# Patient Record
Sex: Female | Born: 1950
Health system: Southern US, Community
[De-identification: ages and names within clinical notes are randomized; demographics above are authoritative.]

## PROBLEM LIST (undated history)

## (undated) DIAGNOSIS — F329 Major depressive disorder, single episode, unspecified: Secondary | ICD-10-CM

## (undated) DIAGNOSIS — I509 Heart failure, unspecified: Secondary | ICD-10-CM

## (undated) DIAGNOSIS — I10 Essential (primary) hypertension: Secondary | ICD-10-CM

## (undated) DIAGNOSIS — G4730 Sleep apnea, unspecified: Secondary | ICD-10-CM

## (undated) DIAGNOSIS — I2510 Atherosclerotic heart disease of native coronary artery without angina pectoris: Secondary | ICD-10-CM

## (undated) DIAGNOSIS — Z5189 Encounter for other specified aftercare: Secondary | ICD-10-CM

## (undated) DIAGNOSIS — F419 Anxiety disorder, unspecified: Secondary | ICD-10-CM

## (undated) DIAGNOSIS — E119 Type 2 diabetes mellitus without complications: Secondary | ICD-10-CM

## (undated) DIAGNOSIS — E785 Hyperlipidemia, unspecified: Secondary | ICD-10-CM

## (undated) DIAGNOSIS — G8929 Other chronic pain: Secondary | ICD-10-CM

## (undated) DIAGNOSIS — I82401 Acute embolism and thrombosis of unspecified deep veins of right lower extremity: Secondary | ICD-10-CM

## (undated) DIAGNOSIS — J449 Chronic obstructive pulmonary disease, unspecified: Secondary | ICD-10-CM

## (undated) DIAGNOSIS — I739 Peripheral vascular disease, unspecified: Secondary | ICD-10-CM

## (undated) DIAGNOSIS — F32A Depression, unspecified: Secondary | ICD-10-CM

## (undated) DIAGNOSIS — K219 Gastro-esophageal reflux disease without esophagitis: Secondary | ICD-10-CM

## (undated) DIAGNOSIS — R011 Cardiac murmur, unspecified: Secondary | ICD-10-CM

## (undated) DIAGNOSIS — Z973 Presence of spectacles and contact lenses: Secondary | ICD-10-CM

## (undated) DIAGNOSIS — M1990 Unspecified osteoarthritis, unspecified site: Secondary | ICD-10-CM

## (undated) DIAGNOSIS — D649 Anemia, unspecified: Secondary | ICD-10-CM

## (undated) HISTORY — PX: CATARACT EXTRACTION: SUR2

## (undated) HISTORY — PX: EYE SURGERY: SHX253

## (undated) HISTORY — PX: CARDIAC CATHETERIZATION: SHX172

## (undated) HISTORY — DX: Heart failure, unspecified: I50.9

## (undated) HISTORY — PX: ABDOMINAL HYSTERECTOMY: SHX81

## (undated) HISTORY — PX: HAND SURGERY: SHX662

## (undated) HISTORY — PX: UTERINE FIBROID SURGERY: SHX826

---

## 1998-09-25 ENCOUNTER — Emergency Department (HOSPITAL_COMMUNITY): Admission: EM | Admit: 1998-09-25 | Discharge: 1998-09-25 | Payer: Self-pay | Admitting: Emergency Medicine

## 2000-05-28 ENCOUNTER — Encounter: Payer: Self-pay | Admitting: Family Medicine

## 2000-05-28 ENCOUNTER — Ambulatory Visit (HOSPITAL_COMMUNITY): Admission: RE | Admit: 2000-05-28 | Discharge: 2000-05-28 | Payer: Self-pay | Admitting: Family Medicine

## 2000-07-03 ENCOUNTER — Encounter: Payer: Self-pay | Admitting: Family Medicine

## 2000-07-03 ENCOUNTER — Ambulatory Visit (HOSPITAL_COMMUNITY): Admission: RE | Admit: 2000-07-03 | Discharge: 2000-07-03 | Payer: Self-pay | Admitting: Family Medicine

## 2000-09-20 ENCOUNTER — Emergency Department (HOSPITAL_COMMUNITY): Admission: EM | Admit: 2000-09-20 | Discharge: 2000-09-20 | Payer: Self-pay | Admitting: Emergency Medicine

## 2000-09-20 ENCOUNTER — Encounter: Payer: Self-pay | Admitting: Emergency Medicine

## 2000-12-02 ENCOUNTER — Ambulatory Visit (HOSPITAL_COMMUNITY): Admission: RE | Admit: 2000-12-02 | Discharge: 2000-12-02 | Payer: Self-pay | Admitting: Family Medicine

## 2000-12-02 ENCOUNTER — Encounter: Payer: Self-pay | Admitting: Family Medicine

## 2000-12-18 ENCOUNTER — Encounter: Payer: Self-pay | Admitting: Emergency Medicine

## 2000-12-18 ENCOUNTER — Emergency Department (HOSPITAL_COMMUNITY): Admission: EM | Admit: 2000-12-18 | Discharge: 2000-12-18 | Payer: Self-pay | Admitting: Emergency Medicine

## 2001-08-05 ENCOUNTER — Emergency Department (HOSPITAL_COMMUNITY): Admission: EM | Admit: 2001-08-05 | Discharge: 2001-08-05 | Payer: Self-pay | Admitting: Emergency Medicine

## 2001-09-01 ENCOUNTER — Ambulatory Visit (HOSPITAL_COMMUNITY): Admission: RE | Admit: 2001-09-01 | Discharge: 2001-09-01 | Payer: Self-pay | Admitting: Family Medicine

## 2002-06-23 ENCOUNTER — Encounter: Payer: Self-pay | Admitting: Emergency Medicine

## 2002-06-23 ENCOUNTER — Emergency Department (HOSPITAL_COMMUNITY): Admission: EM | Admit: 2002-06-23 | Discharge: 2002-06-23 | Payer: Self-pay | Admitting: Emergency Medicine

## 2002-08-05 ENCOUNTER — Emergency Department (HOSPITAL_COMMUNITY): Admission: EM | Admit: 2002-08-05 | Discharge: 2002-08-05 | Payer: Self-pay | Admitting: Emergency Medicine

## 2002-08-05 ENCOUNTER — Encounter: Payer: Self-pay | Admitting: Emergency Medicine

## 2002-08-26 ENCOUNTER — Ambulatory Visit (HOSPITAL_COMMUNITY): Admission: RE | Admit: 2002-08-26 | Discharge: 2002-08-26 | Payer: Self-pay | Admitting: Otolaryngology

## 2002-09-06 ENCOUNTER — Ambulatory Visit (HOSPITAL_COMMUNITY): Admission: RE | Admit: 2002-09-06 | Discharge: 2002-09-06 | Payer: Self-pay | Admitting: Family Medicine

## 2003-02-20 ENCOUNTER — Emergency Department (HOSPITAL_COMMUNITY): Admission: EM | Admit: 2003-02-20 | Discharge: 2003-02-20 | Payer: Self-pay | Admitting: Emergency Medicine

## 2003-02-22 ENCOUNTER — Emergency Department (HOSPITAL_COMMUNITY): Admission: EM | Admit: 2003-02-22 | Discharge: 2003-02-22 | Payer: Self-pay | Admitting: Emergency Medicine

## 2003-02-22 ENCOUNTER — Encounter: Payer: Self-pay | Admitting: Emergency Medicine

## 2003-05-12 ENCOUNTER — Emergency Department (HOSPITAL_COMMUNITY): Admission: EM | Admit: 2003-05-12 | Discharge: 2003-05-12 | Payer: Self-pay | Admitting: Emergency Medicine

## 2003-08-14 ENCOUNTER — Emergency Department (HOSPITAL_COMMUNITY): Admission: EM | Admit: 2003-08-14 | Discharge: 2003-08-14 | Payer: Self-pay | Admitting: Emergency Medicine

## 2003-10-10 ENCOUNTER — Emergency Department (HOSPITAL_COMMUNITY): Admission: EM | Admit: 2003-10-10 | Discharge: 2003-10-10 | Payer: Self-pay | Admitting: Emergency Medicine

## 2003-10-11 ENCOUNTER — Emergency Department (HOSPITAL_COMMUNITY): Admission: EM | Admit: 2003-10-11 | Discharge: 2003-10-11 | Payer: Self-pay | Admitting: *Deleted

## 2004-05-02 ENCOUNTER — Emergency Department (HOSPITAL_COMMUNITY): Admission: EM | Admit: 2004-05-02 | Discharge: 2004-05-02 | Payer: Self-pay | Admitting: Family Medicine

## 2004-08-22 ENCOUNTER — Ambulatory Visit: Payer: Self-pay | Admitting: Internal Medicine

## 2004-08-24 ENCOUNTER — Ambulatory Visit: Payer: Self-pay | Admitting: Family Medicine

## 2004-08-24 ENCOUNTER — Ambulatory Visit: Payer: Self-pay | Admitting: Internal Medicine

## 2004-08-27 ENCOUNTER — Ambulatory Visit: Payer: Self-pay | Admitting: *Deleted

## 2004-08-27 ENCOUNTER — Ambulatory Visit (HOSPITAL_COMMUNITY): Admission: RE | Admit: 2004-08-27 | Discharge: 2004-08-27 | Payer: Self-pay | Admitting: Family Medicine

## 2004-10-08 ENCOUNTER — Ambulatory Visit: Payer: Self-pay | Admitting: Family Medicine

## 2004-10-15 ENCOUNTER — Ambulatory Visit (HOSPITAL_COMMUNITY): Admission: RE | Admit: 2004-10-15 | Discharge: 2004-10-15 | Payer: Self-pay | Admitting: Family Medicine

## 2005-01-14 ENCOUNTER — Emergency Department (HOSPITAL_COMMUNITY): Admission: EM | Admit: 2005-01-14 | Discharge: 2005-01-14 | Payer: Self-pay | Admitting: Emergency Medicine

## 2005-02-04 ENCOUNTER — Ambulatory Visit: Payer: Self-pay | Admitting: Family Medicine

## 2005-04-16 ENCOUNTER — Emergency Department (HOSPITAL_COMMUNITY): Admission: EM | Admit: 2005-04-16 | Discharge: 2005-04-16 | Payer: Self-pay | Admitting: Emergency Medicine

## 2005-05-26 ENCOUNTER — Emergency Department (HOSPITAL_COMMUNITY): Admission: EM | Admit: 2005-05-26 | Discharge: 2005-05-26 | Payer: Self-pay | Admitting: Emergency Medicine

## 2005-06-10 ENCOUNTER — Ambulatory Visit: Payer: Self-pay | Admitting: Family Medicine

## 2005-08-05 ENCOUNTER — Ambulatory Visit: Payer: Self-pay | Admitting: Family Medicine

## 2005-08-19 ENCOUNTER — Ambulatory Visit: Payer: Self-pay | Admitting: Family Medicine

## 2005-10-07 ENCOUNTER — Encounter (INDEPENDENT_AMBULATORY_CARE_PROVIDER_SITE_OTHER): Payer: Self-pay | Admitting: Family Medicine

## 2005-10-07 ENCOUNTER — Ambulatory Visit: Payer: Self-pay | Admitting: Family Medicine

## 2005-10-21 ENCOUNTER — Ambulatory Visit (HOSPITAL_COMMUNITY): Admission: RE | Admit: 2005-10-21 | Discharge: 2005-10-21 | Payer: Self-pay | Admitting: Family Medicine

## 2005-12-27 ENCOUNTER — Ambulatory Visit: Payer: Self-pay | Admitting: Family Medicine

## 2005-12-30 ENCOUNTER — Ambulatory Visit (HOSPITAL_COMMUNITY): Admission: RE | Admit: 2005-12-30 | Discharge: 2005-12-30 | Payer: Self-pay | Admitting: Family Medicine

## 2006-03-13 ENCOUNTER — Ambulatory Visit: Payer: Self-pay | Admitting: Family Medicine

## 2006-04-03 ENCOUNTER — Ambulatory Visit: Payer: Self-pay | Admitting: Family Medicine

## 2006-09-17 ENCOUNTER — Ambulatory Visit: Payer: Self-pay | Admitting: Family Medicine

## 2006-10-30 ENCOUNTER — Ambulatory Visit (HOSPITAL_COMMUNITY): Admission: RE | Admit: 2006-10-30 | Discharge: 2006-10-30 | Payer: Self-pay | Admitting: Family Medicine

## 2006-11-21 ENCOUNTER — Emergency Department (HOSPITAL_COMMUNITY): Admission: EM | Admit: 2006-11-21 | Discharge: 2006-11-21 | Payer: Self-pay | Admitting: Emergency Medicine

## 2007-01-14 ENCOUNTER — Emergency Department (HOSPITAL_COMMUNITY): Admission: EM | Admit: 2007-01-14 | Discharge: 2007-01-15 | Payer: Self-pay | Admitting: Emergency Medicine

## 2007-03-11 ENCOUNTER — Encounter (INDEPENDENT_AMBULATORY_CARE_PROVIDER_SITE_OTHER): Payer: Self-pay | Admitting: *Deleted

## 2007-05-13 ENCOUNTER — Ambulatory Visit: Payer: Self-pay | Admitting: Family Medicine

## 2007-07-06 ENCOUNTER — Ambulatory Visit: Payer: Self-pay | Admitting: Internal Medicine

## 2007-07-23 ENCOUNTER — Ambulatory Visit: Payer: Self-pay | Admitting: Family Medicine

## 2007-08-01 ENCOUNTER — Emergency Department (HOSPITAL_COMMUNITY): Admission: EM | Admit: 2007-08-01 | Discharge: 2007-08-02 | Payer: Self-pay | Admitting: Emergency Medicine

## 2007-10-10 ENCOUNTER — Emergency Department (HOSPITAL_COMMUNITY): Admission: EM | Admit: 2007-10-10 | Discharge: 2007-10-10 | Payer: Self-pay | Admitting: Emergency Medicine

## 2007-10-23 ENCOUNTER — Ambulatory Visit: Payer: Self-pay | Admitting: Internal Medicine

## 2007-11-06 ENCOUNTER — Ambulatory Visit (HOSPITAL_COMMUNITY): Admission: RE | Admit: 2007-11-06 | Discharge: 2007-11-06 | Payer: Self-pay | Admitting: Family Medicine

## 2007-12-18 ENCOUNTER — Ambulatory Visit: Payer: Self-pay | Admitting: Family Medicine

## 2007-12-18 LAB — CONVERTED CEMR LAB
ALT: 8 units/L (ref 0–35)
BUN: 11 mg/dL (ref 6–23)
CO2: 23 meq/L (ref 19–32)
Calcium: 9.3 mg/dL (ref 8.4–10.5)
Chloride: 106 meq/L (ref 96–112)
Cholesterol: 204 mg/dL — ABNORMAL HIGH (ref 0–200)
Creatinine, Ser: 0.65 mg/dL (ref 0.40–1.20)
Glucose, Bld: 103 mg/dL — ABNORMAL HIGH (ref 70–99)
Total Bilirubin: 0.4 mg/dL (ref 0.3–1.2)
Total CHOL/HDL Ratio: 4.4
Triglycerides: 95 mg/dL (ref <150)
Vit D, 1,25-Dihydroxy: 13 — ABNORMAL LOW (ref 30–89)

## 2008-03-14 ENCOUNTER — Ambulatory Visit: Payer: Self-pay | Admitting: Internal Medicine

## 2008-03-14 LAB — CONVERTED CEMR LAB: Vit D, 1,25-Dihydroxy: 34 (ref 30–89)

## 2008-06-13 ENCOUNTER — Emergency Department (HOSPITAL_COMMUNITY): Admission: EM | Admit: 2008-06-13 | Discharge: 2008-06-13 | Payer: Self-pay | Admitting: Emergency Medicine

## 2008-08-22 ENCOUNTER — Emergency Department (HOSPITAL_COMMUNITY): Admission: EM | Admit: 2008-08-22 | Discharge: 2008-08-23 | Payer: Self-pay | Admitting: Emergency Medicine

## 2008-11-07 ENCOUNTER — Emergency Department (HOSPITAL_COMMUNITY): Admission: EM | Admit: 2008-11-07 | Discharge: 2008-11-08 | Payer: Self-pay | Admitting: Emergency Medicine

## 2008-11-09 ENCOUNTER — Ambulatory Visit (HOSPITAL_COMMUNITY): Admission: RE | Admit: 2008-11-09 | Discharge: 2008-11-09 | Payer: Self-pay | Admitting: Family Medicine

## 2008-11-10 ENCOUNTER — Ambulatory Visit: Payer: Self-pay | Admitting: Family Medicine

## 2008-11-11 ENCOUNTER — Ambulatory Visit (HOSPITAL_COMMUNITY): Admission: RE | Admit: 2008-11-11 | Discharge: 2008-11-11 | Payer: Self-pay | Admitting: Family Medicine

## 2008-12-17 ENCOUNTER — Emergency Department (HOSPITAL_COMMUNITY): Admission: EM | Admit: 2008-12-17 | Discharge: 2008-12-17 | Payer: Self-pay | Admitting: Emergency Medicine

## 2008-12-17 ENCOUNTER — Encounter (INDEPENDENT_AMBULATORY_CARE_PROVIDER_SITE_OTHER): Payer: Self-pay | Admitting: Emergency Medicine

## 2008-12-17 ENCOUNTER — Ambulatory Visit: Payer: Self-pay | Admitting: *Deleted

## 2008-12-29 ENCOUNTER — Ambulatory Visit: Payer: Self-pay | Admitting: Family Medicine

## 2009-04-05 ENCOUNTER — Emergency Department (HOSPITAL_COMMUNITY): Admission: EM | Admit: 2009-04-05 | Discharge: 2009-04-06 | Payer: Self-pay | Admitting: Emergency Medicine

## 2009-05-16 ENCOUNTER — Ambulatory Visit: Payer: Self-pay | Admitting: Internal Medicine

## 2009-05-16 ENCOUNTER — Ambulatory Visit: Payer: Self-pay | Admitting: Family Medicine

## 2009-09-06 ENCOUNTER — Emergency Department (HOSPITAL_COMMUNITY): Admission: EM | Admit: 2009-09-06 | Discharge: 2009-09-06 | Payer: Self-pay | Admitting: Emergency Medicine

## 2009-10-19 ENCOUNTER — Ambulatory Visit: Payer: Self-pay | Admitting: Family Medicine

## 2009-11-10 ENCOUNTER — Ambulatory Visit (HOSPITAL_COMMUNITY): Admission: RE | Admit: 2009-11-10 | Discharge: 2009-11-10 | Payer: Self-pay | Admitting: Family Medicine

## 2010-04-03 ENCOUNTER — Ambulatory Visit: Payer: Self-pay | Admitting: Family Medicine

## 2010-04-03 LAB — CONVERTED CEMR LAB
CO2: 26 meq/L (ref 19–32)
Creatinine, Ser: 0.72 mg/dL (ref 0.40–1.20)
Eosinophils Absolute: 0.3 10*3/uL (ref 0.0–0.7)
Eosinophils Relative: 3 % (ref 0–5)
Glucose, Bld: 87 mg/dL (ref 70–99)
HCT: 41 % (ref 36.0–46.0)
Hemoglobin: 13.2 g/dL (ref 12.0–15.0)
Lymphocytes Relative: 46 % (ref 12–46)
Lymphs Abs: 4 10*3/uL (ref 0.7–4.0)
MCV: 85.8 fL (ref 78.0–100.0)
Monocytes Absolute: 0.6 10*3/uL (ref 0.1–1.0)
RDW: 15.5 % (ref 11.5–15.5)
Total Bilirubin: 0.2 mg/dL — ABNORMAL LOW (ref 0.3–1.2)

## 2010-05-21 ENCOUNTER — Emergency Department (HOSPITAL_COMMUNITY): Admission: EM | Admit: 2010-05-21 | Discharge: 2010-05-21 | Payer: Self-pay | Admitting: Emergency Medicine

## 2010-06-09 ENCOUNTER — Emergency Department (HOSPITAL_COMMUNITY)
Admission: EM | Admit: 2010-06-09 | Discharge: 2010-06-09 | Payer: Self-pay | Source: Home / Self Care | Admitting: Emergency Medicine

## 2010-06-19 ENCOUNTER — Ambulatory Visit (HOSPITAL_COMMUNITY)
Admission: RE | Admit: 2010-06-19 | Discharge: 2010-06-19 | Payer: Self-pay | Source: Home / Self Care | Attending: Family Medicine | Admitting: Family Medicine

## 2010-07-15 ENCOUNTER — Encounter: Payer: Self-pay | Admitting: Family Medicine

## 2010-09-04 LAB — CBC
HCT: 40.7 % (ref 36.0–46.0)
Hemoglobin: 13.7 g/dL (ref 12.0–15.0)
MCV: 84.3 fL (ref 78.0–100.0)
RBC: 4.83 MIL/uL (ref 3.87–5.11)
WBC: 10.3 10*3/uL (ref 4.0–10.5)

## 2010-09-04 LAB — DIFFERENTIAL
Eosinophils Relative: 3 % (ref 0–5)
Lymphocytes Relative: 36 % (ref 12–46)
Lymphs Abs: 3.7 10*3/uL (ref 0.7–4.0)
Monocytes Absolute: 0.8 10*3/uL (ref 0.1–1.0)
Monocytes Relative: 8 % (ref 3–12)

## 2010-09-16 LAB — POCT CARDIAC MARKERS: Troponin i, poc: <0.05 ng/mL (ref 0.00–0.09)

## 2010-09-16 LAB — DIFFERENTIAL
Basophils Relative: 1 % (ref 0–1)
Eosinophils Absolute: 0.3 10*3/uL (ref 0.0–0.7)
Monocytes Relative: 9 % (ref 3–12)
Neutrophils Relative %: 40 % — ABNORMAL LOW (ref 43–77)

## 2010-09-16 LAB — CBC
HCT: 42.5 % (ref 36.0–46.0)
MCV: 87.9 fL (ref 78.0–100.0)
Platelets: 392 10*3/uL (ref 150–400)
RDW: 15.1 % (ref 11.5–15.5)
WBC: 8.1 10*3/uL (ref 4.0–10.5)

## 2010-09-16 LAB — COMPREHENSIVE METABOLIC PANEL
Albumin: 3.8 g/dL (ref 3.5–5.2)
BUN: 11 mg/dL (ref 6–23)
Chloride: 104 mEq/L (ref 96–112)
Creatinine, Ser: 0.7 mg/dL (ref 0.4–1.2)
Total Bilirubin: 0.6 mg/dL (ref 0.3–1.2)

## 2010-09-16 LAB — D-DIMER, QUANTITATIVE: D-Dimer, Quant: 0.42 ug/mL-FEU (ref 0.00–0.48)

## 2010-10-02 LAB — POCT CARDIAC MARKERS
CKMB, poc: 1.2 ng/mL (ref 1.0–8.0)
Myoglobin, poc: 69.6 ng/mL (ref 12–200)
Troponin i, poc: <0.05 ng/mL (ref 0.00–0.09)

## 2010-10-04 LAB — DIFFERENTIAL
Basophils Absolute: 0.1 10*3/uL (ref 0.0–0.1)
Basophils Relative: 1 % (ref 0–1)
Eosinophils Absolute: 0.3 10*3/uL (ref 0.0–0.7)
Eosinophils Relative: 3 % (ref 0–5)
Neutrophils Relative %: 55 % (ref 43–77)

## 2010-10-04 LAB — CBC
HCT: 42.4 % (ref 36.0–46.0)
Platelets: 378 10*3/uL (ref 150–400)
WBC: 8.9 10*3/uL (ref 4.0–10.5)

## 2010-10-04 LAB — CK TOTAL AND CKMB (NOT AT ARMC)
CK, MB: 1.6 ng/mL (ref 0.3–4.0)
Relative Index: INVALID (ref 0.0–2.5)
Total CK: 89 U/L (ref 7–177)

## 2010-10-04 LAB — BASIC METABOLIC PANEL
BUN: 13 mg/dL (ref 6–23)
Calcium: 9.3 mg/dL (ref 8.4–10.5)
Creatinine, Ser: 0.62 mg/dL (ref 0.4–1.2)
GFR calc non Af Amer: >60 mL/min (ref >60)
Potassium: 3.6 mEq/L (ref 3.5–5.1)

## 2010-10-04 LAB — TROPONIN I: Troponin I: 0.01 ng/mL (ref 0.00–0.06)

## 2010-10-16 ENCOUNTER — Other Ambulatory Visit (HOSPITAL_COMMUNITY): Payer: Self-pay | Admitting: Family Medicine

## 2010-10-16 DIAGNOSIS — Z1231 Encounter for screening mammogram for malignant neoplasm of breast: Secondary | ICD-10-CM

## 2010-11-10 ENCOUNTER — Emergency Department (HOSPITAL_COMMUNITY): Payer: Medicare Other

## 2010-11-10 ENCOUNTER — Emergency Department (HOSPITAL_COMMUNITY)
Admission: EM | Admit: 2010-11-10 | Discharge: 2010-11-10 | Disposition: A | Discharge status: Home / Self Care | Payer: Medicare Other | Attending: Emergency Medicine | Admitting: Emergency Medicine

## 2010-11-10 DIAGNOSIS — Z79899 Other long term (current) drug therapy: Secondary | ICD-10-CM | POA: Insufficient documentation

## 2010-11-10 DIAGNOSIS — M129 Arthropathy, unspecified: Secondary | ICD-10-CM | POA: Insufficient documentation

## 2010-11-10 DIAGNOSIS — I10 Essential (primary) hypertension: Secondary | ICD-10-CM | POA: Insufficient documentation

## 2010-11-10 DIAGNOSIS — M545 Low back pain, unspecified: Secondary | ICD-10-CM | POA: Insufficient documentation

## 2010-11-13 ENCOUNTER — Ambulatory Visit (HOSPITAL_COMMUNITY)
Admission: RE | Admit: 2010-11-13 | Discharge: 2010-11-13 | Disposition: A | Discharge status: Home / Self Care | Payer: Medicare Other | Source: Ambulatory Visit | Attending: Family Medicine | Admitting: Family Medicine

## 2010-11-13 DIAGNOSIS — Z1231 Encounter for screening mammogram for malignant neoplasm of breast: Secondary | ICD-10-CM | POA: Insufficient documentation

## 2010-11-15 ENCOUNTER — Other Ambulatory Visit: Payer: Self-pay | Admitting: Family Medicine

## 2010-11-15 DIAGNOSIS — R928 Other abnormal and inconclusive findings on diagnostic imaging of breast: Secondary | ICD-10-CM

## 2010-11-21 ENCOUNTER — Ambulatory Visit
Admission: RE | Admit: 2010-11-21 | Discharge: 2010-11-21 | Disposition: A | Discharge status: Home / Self Care | Payer: Medicare Other | Source: Ambulatory Visit | Attending: Family Medicine | Admitting: Family Medicine

## 2010-11-21 DIAGNOSIS — R928 Other abnormal and inconclusive findings on diagnostic imaging of breast: Secondary | ICD-10-CM

## 2010-11-27 ENCOUNTER — Other Ambulatory Visit: Payer: Self-pay | Admitting: Neurosurgery

## 2010-11-27 DIAGNOSIS — M545 Low back pain: Secondary | ICD-10-CM

## 2010-11-30 ENCOUNTER — Ambulatory Visit
Admission: RE | Admit: 2010-11-30 | Discharge: 2010-11-30 | Disposition: A | Discharge status: Home / Self Care | Payer: Medicare Other | Source: Ambulatory Visit | Attending: Neurosurgery | Admitting: Neurosurgery

## 2010-11-30 DIAGNOSIS — M545 Low back pain: Secondary | ICD-10-CM

## 2011-01-10 ENCOUNTER — Observation Stay (HOSPITAL_COMMUNITY)
Admission: EM | Admit: 2011-01-10 | Discharge: 2011-01-11 | Disposition: A | Discharge status: Home / Self Care | Payer: Medicare Other | Attending: Internal Medicine | Admitting: Internal Medicine

## 2011-01-10 ENCOUNTER — Emergency Department (HOSPITAL_COMMUNITY): Payer: Medicare Other

## 2011-01-10 DIAGNOSIS — D72829 Elevated white blood cell count, unspecified: Secondary | ICD-10-CM | POA: Insufficient documentation

## 2011-01-10 DIAGNOSIS — M129 Arthropathy, unspecified: Secondary | ICD-10-CM | POA: Insufficient documentation

## 2011-01-10 DIAGNOSIS — E785 Hyperlipidemia, unspecified: Secondary | ICD-10-CM | POA: Insufficient documentation

## 2011-01-10 DIAGNOSIS — F17200 Nicotine dependence, unspecified, uncomplicated: Secondary | ICD-10-CM | POA: Insufficient documentation

## 2011-01-10 DIAGNOSIS — E669 Obesity, unspecified: Secondary | ICD-10-CM | POA: Insufficient documentation

## 2011-01-10 DIAGNOSIS — R079 Chest pain, unspecified: Principal | ICD-10-CM | POA: Insufficient documentation

## 2011-01-10 DIAGNOSIS — R0602 Shortness of breath: Secondary | ICD-10-CM | POA: Insufficient documentation

## 2011-01-10 DIAGNOSIS — G8929 Other chronic pain: Secondary | ICD-10-CM | POA: Insufficient documentation

## 2011-01-10 DIAGNOSIS — I10 Essential (primary) hypertension: Secondary | ICD-10-CM | POA: Insufficient documentation

## 2011-01-10 DIAGNOSIS — Z8249 Family history of ischemic heart disease and other diseases of the circulatory system: Secondary | ICD-10-CM | POA: Insufficient documentation

## 2011-01-10 DIAGNOSIS — M549 Dorsalgia, unspecified: Secondary | ICD-10-CM | POA: Insufficient documentation

## 2011-01-10 DIAGNOSIS — J45909 Unspecified asthma, uncomplicated: Secondary | ICD-10-CM | POA: Insufficient documentation

## 2011-01-10 LAB — PRO B NATRIURETIC PEPTIDE: Pro B Natriuretic peptide (BNP): 8.1 pg/mL (ref 0–125)

## 2011-01-10 LAB — COMPREHENSIVE METABOLIC PANEL
AST: 18 U/L (ref 0–37)
Albumin: 3.8 g/dL (ref 3.5–5.2)
Alkaline Phosphatase: 91 U/L (ref 39–117)
Chloride: 100 mEq/L (ref 96–112)
Creatinine, Ser: 0.75 mg/dL (ref 0.50–1.10)
Potassium: 3.7 mEq/L (ref 3.5–5.1)
Total Bilirubin: 0.3 mg/dL (ref 0.3–1.2)
Total Protein: 8.5 g/dL — ABNORMAL HIGH (ref 6.0–8.3)

## 2011-01-10 LAB — CARDIAC PANEL(CRET KIN+CKTOT+MB+TROPI): Relative Index: INVALID (ref 0.0–2.5)

## 2011-01-10 LAB — URINALYSIS, MICROSCOPIC ONLY
Bilirubin Urine: NEGATIVE
Glucose, UA: NEGATIVE mg/dL
Hgb urine dipstick: NEGATIVE
Ketones, ur: NEGATIVE mg/dL
Protein, ur: NEGATIVE mg/dL

## 2011-01-10 LAB — CBC
MCHC: 33.3 g/dL (ref 30.0–36.0)
Platelets: 448 10*3/uL — ABNORMAL HIGH (ref 150–400)
RDW: 15 % (ref 11.5–15.5)
WBC: 13.4 10*3/uL — ABNORMAL HIGH (ref 4.0–10.5)

## 2011-01-10 LAB — RAPID URINE DRUG SCREEN, HOSP PERFORMED
Amphetamines: NOT DETECTED
Benzodiazepines: NOT DETECTED
Cocaine: NOT DETECTED
Opiates: NOT DETECTED
Tetrahydrocannabinol: POSITIVE — AB

## 2011-01-10 LAB — TROPONIN I: Troponin I: <0.3 ng/mL (ref <0.30)

## 2011-01-10 MED ORDER — IOHEXOL 300 MG/ML  SOLN
100.0000 mL | Freq: Once | INTRAMUSCULAR | Status: AC | PRN
  Administered 2011-01-10: 100 mL via INTRAVENOUS

## 2011-01-11 ENCOUNTER — Ambulatory Visit (HOSPITAL_COMMUNITY)
Admission: RE | Admit: 2011-01-11 | Discharge: 2011-01-11 | Disposition: A | Discharge status: Home / Self Care | Payer: Medicare Other | Source: Ambulatory Visit | Attending: Cardiology | Admitting: Cardiology

## 2011-01-11 LAB — LIPID PANEL
Total CHOL/HDL Ratio: 4.3 RATIO
VLDL: 32 mg/dL (ref 0–40)

## 2011-01-11 LAB — BASIC METABOLIC PANEL
BUN: 15 mg/dL (ref 6–23)
Calcium: 9.5 mg/dL (ref 8.4–10.5)
Chloride: 99 mEq/L (ref 96–112)
Creatinine, Ser: 0.8 mg/dL (ref 0.50–1.10)
GFR calc Af Amer: >60 mL/min (ref >60)

## 2011-01-11 LAB — CBC
HCT: 38.4 % (ref 36.0–46.0)
MCHC: 32.3 g/dL (ref 30.0–36.0)
Platelets: 409 10*3/uL — ABNORMAL HIGH (ref 150–400)
RDW: 15 % (ref 11.5–15.5)

## 2011-01-11 LAB — CARDIAC PANEL(CRET KIN+CKTOT+MB+TROPI)
CK, MB: 2.2 ng/mL (ref 0.3–4.0)
Total CK: 76 U/L (ref 7–177)
Troponin I: <0.3 ng/mL (ref <0.30)

## 2011-01-11 LAB — TSH: TSH: 2.688 u[IU]/mL (ref 0.350–4.500)

## 2011-01-11 LAB — HEMOGLOBIN A1C: Mean Plasma Glucose: 146 mg/dL — ABNORMAL HIGH (ref <117)

## 2011-01-11 MED ORDER — TECHNETIUM TC 99M TETROFOSMIN IV KIT
10.0000 | PACK | Freq: Once | INTRAVENOUS | Status: AC | PRN
  Administered 2011-01-11: 10 via INTRAVENOUS

## 2011-01-11 MED ORDER — TECHNETIUM TC 99M TETROFOSMIN IV KIT
30.0000 | PACK | Freq: Once | INTRAVENOUS | Status: AC | PRN
  Administered 2011-01-11: 30 via INTRAVENOUS

## 2011-01-11 NOTE — H&P (Unsigned)
NAMEJLYN, [name]               ACCOUNT NO.:  [id number]  MEDICAL RECORD NO.:  [id number]  LOCATION:  1407                         FACILITY:  [facility]  PHYSICIAN:  Hartley Barefoot, MD    DATE OF BIRTH:  [birth year]  DATE OF ADMISSION:  01/10/2011 DATE OF DISCHARGE:                             HISTORY & PHYSICAL   CHIEF COMPLAINT:  Chest pain radiating to the arm.  HISTORY OF PRESENT ILLNESS:  This is a 60 year old with past medical history of hypertension, hyperlipidemia, and current smoker who presents complaining of chest pain.  She relates that she had chest pain 3 days prior to admission.  She started to have the chest pain again on the day of admission in the middle of her chest.  Initially, it was 10/10, now has decreased to 4/10.  Pain is intermittent, pressure like. Accompanied with shortness of breath.  She also relates some shortness of breath on exertion for years and chest pain on exertion for years. She related that pain can last up to 45 minutes.  It is not associated with meals or breathing.  The pain started this morning when she was in distress, she is having a lot of social and personal stress with her son and with her friend.  She denies nausea, vomiting, diarrhea, or lower extremity edema.  ALLERGIES:  No known drug allergies.  PAST MEDICAL HISTORY: 1. History of asthma. 2. Arthritis. 3. Hypertension. 4. Hyperlipidemia. 5. Current smoker.  MEDICATIONS: 1. Benazepril/hydrochlorothiazide 20/12.5 p.o. daily. 2. Trazodone 50 mg 2 tablets daily at bedtime. 3. Albuterol inhaler 2 puffs daily as needed. 4. Hydrocodone/Tylenol 5/325 daily as needed.  SOCIAL HISTORY:  She is a current smoker.  She has been smoking since 60 years old.  She smokes a couple of cigarettes per day.  She denies alcohol or recreational drugs.  She lives by herself in an apartment.  FAMILY HISTORY:  Her mother died of unknown cause.  Father had history of hypertension and MI and died  of MI.  PHYSICAL EXAMINATION:  VITAL SIGNS:  Blood pressure 136/83, pulse 60, respirations 20, sat 96 on room air, and temp 98.3. GENERAL:  The patient is lying in bed, in no acute distress. HEENT:  Head is atraumatic and normocephalic.  Eyes are anicteric. Pupils are equal and reactive to light.  Extraocular muscles are intact. CARDIOVASCULAR:  S1 and S2.  Regular rhythm and rate.  No rubs, murmurs, or gallops. LUNGS:  Bilateral good air movement.  No wheezing, crackles, or rhonchi. ABDOMEN:  Bowel sounds positive.  Soft, nontender, and nondistended.  No rigidity, no guarding. EXTREMITIES:  Pulse present.  No edema. NEURO:  Nonfocal.  ADMISSION LABORATORY DATA:  White blood cells 13.4, hemoglobin 13.9, hematocrit 41.4, and platelets 448.  Sodium 136, potassium 3.7, chloride 100, bicarb 26, BUN 17, creatinine 0.7, glucose 100, calcium 10.6, albumin 3.8.  BNP 8.0.  Troponin 0.30.  Chest x-ray, no acute cardiopulmonary disease.  ASSESSMENT AND PLAN:  This is a 60 year old with multiple risk factors of hypertension, hyperlipidemia, and current smoker who presents complaining of chest pain.  She also relates chest pain on exertion and shortness of breath on exertion. 1. Chest pain, rule  out acute coronary syndrome.  Admit to telemetry.     Cardiac enzymes x3 q.8 h.  EKG without ST changes or elevation.     She has typical and atypical feature.  She relates some chest pain     on exertion for years.  Her first set of cardiac enzyme is     negative.  I will risk stratify.  We will get hemoglobin A1c, UDS,     and fasting lipid panel.  She has multiple risk factors.  She is a     current smoker, hyperlipidemia, and she is 60 year old.  We will     ask Cardiology for the inpatient evaluation.  I will get a 2-D     echo, D-dimer, and UDS.  We will start nitroglycerin p.r.n. for     chest pain.  I will hold on beta-blockers due to bradycardia. 2. Leukocytosis, may be reactive.  I will  monitor for fever.  Check     UA, culture, blood cultures. 3. Hypertension.  Continue with home medication. 4. Deep vein thrombosis prophylaxis.  Heparin.     Hartley Barefoot, MD     BR/MEDQ  D:  01/10/2011  T:  01/11/2011  Job:  [id number]

## 2011-01-17 LAB — CULTURE, BLOOD (ROUTINE X 2): Culture: NO GROWTH

## 2011-01-17 NOTE — Discharge Summary (Addendum)
NAMECORBIN, [name]               ACCOUNT NO.:  [id number]  MEDICAL RECORD NO.:  [id number]  LOCATION:  1407                         FACILITY:  [facility]  PHYSICIAN:  Hartley Barefoot, MD    DATE OF BIRTH:  [birth year]  DATE OF ADMISSION:  01/10/2011 DATE OF DISCHARGE:  01/11/2011                              DISCHARGE SUMMARY   PRIMARY CARE PROVIDER:  [facility], Dr. Philipp Deputy.  DISCHARGE DIAGNOSES: 1. Chest pain possible Muscle skelletal Pain 2. Leukocytosis. 3. Hypertension. 4. History of asthma. 5. Hyperlipidemia.  DISCHARGE MEDICATIONS: 1. Protonix 40 mg p.o. daily. 2. Simvastatin 20 mg p.o. daily. 3. Aspirin 81 mg p.o. daily. 4. Albuterol inhaler 2 puffs daily as needed for shortness of breath. 5. Benazepril hydrochlorothiazide 20/12.5 one tablet p.o. daily. 6. Hydrocodone/APAP 5/325 one tablet p.o. daily as needed for pain. 7. Trazodone 50 mg 2 tablets p.o. daily at bedtime as needed for     sleep.  DIAGNOSTIC LABS:  WBC is 11.8, hemoglobin 12.4, hematocrit 38.4, platelets 409.  Sodium 135, potassium 3.5, chloride 99, CO2 of 30, BUN 15, creatinine 0.80, glucose 108.  TSH is 2.68.  Urine drug screen positive for tetrahydrocannabinol, otherwise, negative.  D-dimer is 0.66.  First set of cardiac enzymes yield a total CK 85, CK-MB 2.1, troponin I less than 0.30.  Second set yields a total CK 76, CK-MB 2.2, troponin I less than 0.30.  Hemoglobin A1c is 6.7.  Lipid profile shows cholesterol 218, triglycerides 162, HDL 51, LDL 135.  Urinalysis was negative.  DIAGNOSTIC IMAGING: 1. On January 10, 2011, chest x-ray yields no acute cardiopulmonary     findings. 2. On January 10, 2011, CT angio of the chest was negative for acute PE     or thoracic aortic dissection.  Coronary and aortic plaque. 3. Myocardial Myoview yields no evidence of myocardial ischemia or     infarction.  Normal left ventricular wall motion.  Estimated QGS     ejection fraction 66%.  CONSULTATIONS:  Dr.  [place] with [facility].  PROCEDURE:  Myoview as stated above.  BRIEF HISTORY:  [name] is 60 year old woman with a history of hypertension, hyperlipidemia and current smoker who presented to the ED on July 19 with a chief complaint of chest pain.  She indicated that she had chest pain for 3 days prior to presentation.  She reported she started having chest pain in the middle of her chest.  Initially it was rated 10 out of 10, intermittent, pressure-like.  Associated symptoms included shortness of breath.  She reports she has experienced shortness of breath on exertion for years.  She indicated the pain episode lasted 45 minutes.  She denied nausea, vomiting, diarrhea or lower extremity edema.  [facility] were asked to admit for further evaluation and treatment.  HOSPITAL COURSE BY PROBLEM: 1. Chest pain: .  The patient was     admitted to telemetry unit to rule out acute coronary syndrome.     Cardiac enzymes were cycled 3 times and were negative.  EKG was     done and repeated without ST changes or elevation.  She did     experience another episode in the middle the  night during her     hospitalization with some shortness of breath.  There were no EKG     changes or noted changes in her cardiac enzymes.  Her hemoglobin     A1c, urine drug screen and fasting lipid panel were completed as     stated above.  Dr. Swaziland saw the patient and an inpatient Myoview     was done as stated above.  On admission, the patient did have an     elevated D-dimer.  A CT angio of the chest was completed and ruled     out PE.  The patient had no further episodes of chest pain.  She     will be discharged on 81 mg of aspirin and 20 mg of simvastatin     given her risk factors and hyperlipidemia.  She will follow up with     her primary care provider in 1 to 2 weeks. 2. Leukocytosis likely reactive.  The patient is afebrile.  No signs     or symptoms of infection. 3. Hypertension, fair  control on home meds.  Will be discharged on     same. 4. History of asthma, stable during this hospitalization. 5. Hyperlipidemia.  The patient was started on a statin.  PHYSICAL EXAM:  VITAL SIGNS:  Temperature 98.2, blood pressure 150/91, heart rate 50, respiration 16, sats 96% on room air. GENERAL:  Awake, alert, well nourished, well hydrated, no acute distress. CV:  Regular rate and rhythm.  No murmur, gallop or rub.  Trace lower extremity edema. RESPIRATORY:  Normal effort.  Breath sounds clear to auscultation bilaterally.  Slightly diminished.  No rhonchi, wheezes or rales. ABDOMEN:  Obese, soft, positive bowel sounds throughout, nontender to palpation.  No mass or organomegaly noted. NEURO:  Alert and oriented x3.  Speech clear.  Facial symmetry.  DIET:  Heart healthy, low salt.  ACTIVITY:  No restrictions.  FOLLOWUP:  Follow up with Dr. Philipp Deputy at [facility] in 1 to 2 weeks.  Time spent on this dictation, 35 minutes.     Gwenyth Bender, NP   ______________________________ Hartley Barefoot, MD    KMB/MEDQ  D:  01/11/2011  T:  01/11/2011  Job:  [id number]  cc:   Tresa Endo L. Philipp Deputy, M.D. Fax: [phone]  Electronically Signed by Hartley Barefoot MD on 01/17/2011 09:11:54 PM Electronically Signed by Toya Smothers  on 01/20/2011 07:35:20 AM

## 2011-01-17 NOTE — H&P (Signed)
[name], [name]               ACCOUNT NO.:  [id number]  MEDICAL RECORD NO.:  [id number]  LOCATION:  1407                         FACILITY:  [facility]  PHYSICIAN:  Hartley Barefoot, MD    DATE OF BIRTH:  March 02, 1951  DATE OF ADMISSION:  01/10/2011 DATE OF DISCHARGE:  01/11/2011                             HISTORY & PHYSICAL   CHIEF COMPLAINT:  Chest pain radiating to the arm.  HISTORY OF PRESENT ILLNESS:  This is a 60 year old with past medical history of hypertension, hyperlipidemia, and current smoker who presents complaining of chest pain.  She relates that she had chest pain 3 days prior to admission.  She started to have the chest pain again on the day of admission in the middle of her chest.  Initially, it was 10/10, now has decreased to 4/10.  Pain is intermittent, pressure like. Accompanied with shortness of breath.  She also relates some shortness of breath on exertion for years and chest pain on exertion for years. She related that pain can last up to 45 minutes.  It is not associated with meals or breathing.  The pain started this morning when she was in distress, she is having a lot of social and personal stress with her son and with her friend.  She denies nausea, vomiting, diarrhea, or lower extremity edema.  ALLERGIES:  No known drug allergies.  PAST MEDICAL HISTORY: 1. History of asthma. 2. Arthritis. 3. Hypertension. 4. Hyperlipidemia. 5. Current smoker.  MEDICATIONS: 1. Benazepril/hydrochlorothiazide 20/12.5 p.o. daily. 2. Trazodone 50 mg 2 tablets daily at bedtime. 3. Albuterol inhaler 2 puffs daily as needed. 4. Hydrocodone/Tylenol 5/325 daily as needed.  SOCIAL HISTORY:  She is a current smoker.  She has been smoking since 60 years old.  She smokes a couple of cigarettes per day.  She denies alcohol or recreational drugs.  She lives by herself in an apartment.  FAMILY HISTORY:  Her mother died of unknown cause.  Father had history of hypertension  and MI and died of MI.  PHYSICAL EXAMINATION:  VITAL SIGNS:  Blood pressure 136/83, pulse 60, respirations 20, sat 96 on room air, and temp 98.3. GENERAL:  The patient is lying in bed, in no acute distress. HEENT:  Head is atraumatic and normocephalic.  Eyes are anicteric. Pupils are equal and reactive to light.  Extraocular muscles are intact. CARDIOVASCULAR:  S1 and S2.  Regular rhythm and rate.  No rubs, murmurs, or gallops. LUNGS:  Bilateral good air movement.  No wheezing, crackles, or rhonchi. ABDOMEN:  Bowel sounds positive.  Soft, nontender, and nondistended.  No rigidity, no guarding. EXTREMITIES:  Pulse present.  No edema. NEURO:  Nonfocal.  ADMISSION LABORATORY DATA:  White blood cells 13.4, hemoglobin 13.9, hematocrit 41.4, and platelets 448.  Sodium 136, potassium 3.7, chloride 100, bicarb 26, BUN 17, creatinine 0.7, glucose 100, calcium 10.6, albumin 3.8.  BNP 8.0.  Troponin 0.30.  Chest x-ray, no acute cardiopulmonary disease.  ASSESSMENT AND PLAN:  This is a 60 year old with multiple risk factors of hypertension, hyperlipidemia, and current smoker who presents complaining of chest pain.  She also relates chest pain on exertion and shortness of breath on exertion. 1. Chest  pain, rule out acute coronary syndrome.  Admit to telemetry.     Cardiac enzymes x3 q.8 h.  EKG without ST changes or elevation.     She has typical and atypical feature.  She relates some chest pain     on exertion for years.  Her first set of cardiac enzyme is     negative.  I will risk stratify.  We will get hemoglobin A1c, UDS,     and fasting lipid panel.  She has multiple risk factors.  She is a     current smoker, hyperlipidemia, and she is 60 year old.  We will     ask Cardiology for the inpatient evaluation.  I will get a 2-D     echo, D-dimer, and UDS.  We will start nitroglycerin p.r.n. for     chest pain.  I will hold on beta-blockers due to bradycardia. 2. Leukocytosis, may be  reactive.  I will monitor for fever.  Check     UA, culture, blood cultures. 3. Hypertension.  Continue with home medication. 4. Deep vein thrombosis prophylaxis.  Heparin.     Hartley Barefoot, MD     BR/MEDQ  D:  01/10/2011  T:  01/15/2011  Job:  [id number]  Electronically Signed by Hartley Barefoot MD on 01/17/2011 09:13:45 PM

## 2011-01-18 NOTE — Consult Note (Signed)
NAMESULLIVAN, BLASING NO.:  [id number]  MEDICAL RECORD NO.:  [id number]  LOCATION:  1407                         FACILITY:  [facility]  PHYSICIAN:  Peter M. Swaziland, M.D.  DATE OF BIRTH:  1951/03/02  DATE OF CONSULTATION:  01/10/2011 DATE OF DISCHARGE:                                CONSULTATION   HISTORY OF PRESENT ILLNESS:  [name] is a 60 year old black female with prior history of hypertension, hyperlipidemia, tobacco use, and family history of coronary disease.  She presents with a 3- to 4-day history of substernal chest pain.  The pain occurs at rest.  Mostly occurs at night.  It is mid substernal and radiates to her left arm. There is no associated shortness of breath, diaphoresis, or nausea. Today, the patient had multiple stressors and noted increasing in intensity of her pain.  She was brought by friends to emergency department.  She has no known history of cardiac disease and has never had cardiac evaluation previously.  She is currently pain-free.  PAST MEDICAL HISTORY: 1. Hypertension. 2. Hypercholesterolemia. 3. Arthritis.  PRIOR SURGERIES:  Previous C-section and hysterectomy.  SOCIAL HISTORY:  She is disabled.  She has 3 children.  She is divorced. She formerly worked in [employer] for 25 years.  She is an active smoker.  She denies alcohol or drug use.  FAMILY HISTORY:  Her father died after myocardial infarction.  She also has a 1 brother who has heart disease.  ALLERGIES:  She has no known allergies.  MEDICATIONS PRIOR TO ADMISSION: 1. Benazepril HCT 20/12.5 mg daily. 2. Trazodone 100 mg nightly. 3. Albuterol inhaler p.r.n. 4. Hydrocodone/APAP p.r.n.  She is on no cholesterol-lowering medication.  REVIEW OF SYSTEMS:  She has chronic back pain and has recent lumbar injections.  All other systems are reviewed and are negative.  PHYSICAL EXAMINATION:  GENERAL:  She is an obese, black female, in no apparent distress. HEENT:  She  is normocephalic, atraumatic.  Pupils are equal, round, and reactive to light and accommodation.  Extraocular movements are full. Oropharynx is clear. NECK:  Supple without JVD, adenopathy, thyromegaly, or bruits. LUNGS:  Clear. CARDIAC:  Regular rate and rhythm without gallop, murmur, rub, or click. ABDOMEN:  Obese, soft, and nontender.  There are no masses or hepatosplenomegaly. GU AND PELVIC:  Deferred.  She has no lower extremity edema.  Femoral pedal pulses are 2+. SKIN:  Warm and dry. NEUROLOGIC:  She is alert and oriented x3.  Cranial nerves II through XII are intact.  LABORATORY DATA:  White count 13,400, hemoglobin 13.9, hematocrit 41.7, platelets 448,000.  Sodium is 136, potassium 3.7, chloride 100, CO2 of 26, BUN 17, creatinine 0.75, glucose of 100.  BNP is 8.1.  Troponin is less than 0.31.  ECG is normal.  Chest x-ray shows no active disease.  IMPRESSION: 1. Crescendo chest pain with atypical features.  The patient does have     multiple cardiac risk factors. 2. Hypertension. 3. Hyperlipidemia, untreated. 4. Arthritis. 5. Tobacco abuse.  PLAN:  The patient will be admitted by the hospitalist service.  We will obtain serial cardiac enzymes and ECGs.  We will check a D-dimer level. If  these studies are negative, I would recommend a Lexiscan Myoview study in the morning.  I would recommend statin therapy for hyperlipidemia.  She needs to continue on blood pressure control.  She is counseled on need for smoking cessation.          ______________________________ Peter M. [place]iland, M.D.     PMJ/MEDQ  D:  01/10/2011  T:  01/11/2011  Job:  [id number]  cc:   [facility] Fax: [phone]  Electronically Signed by PETER Swaziland M.D. on 01/18/2011 08:50:21 AM

## 2011-03-03 ENCOUNTER — Emergency Department (HOSPITAL_COMMUNITY)
Admission: EM | Admit: 2011-03-03 | Discharge: 2011-03-03 | Disposition: A | Discharge status: Home / Self Care | Payer: Medicare Other | Attending: Emergency Medicine | Admitting: Emergency Medicine

## 2011-03-03 DIAGNOSIS — G8929 Other chronic pain: Secondary | ICD-10-CM | POA: Insufficient documentation

## 2011-03-03 DIAGNOSIS — I10 Essential (primary) hypertension: Secondary | ICD-10-CM | POA: Insufficient documentation

## 2011-03-03 DIAGNOSIS — M545 Low back pain, unspecified: Secondary | ICD-10-CM | POA: Insufficient documentation

## 2011-03-03 DIAGNOSIS — M549 Dorsalgia, unspecified: Secondary | ICD-10-CM | POA: Insufficient documentation

## 2011-03-15 LAB — I-STAT 8, (EC8 V) (CONVERTED LAB)
BUN: 15
Glucose, Bld: 118 — ABNORMAL HIGH
Potassium: 3.3 — ABNORMAL LOW
TCO2: 30
pH, Ven: 7.397 — ABNORMAL HIGH

## 2011-03-15 LAB — RAPID URINE DRUG SCREEN, HOSP PERFORMED
Benzodiazepines: NOT DETECTED
Cocaine: NOT DETECTED

## 2011-03-15 LAB — URINE CULTURE

## 2011-03-15 LAB — URINALYSIS, ROUTINE W REFLEX MICROSCOPIC
Hgb urine dipstick: NEGATIVE
Protein, ur: NEGATIVE
Urobilinogen, UA: 1

## 2011-03-15 LAB — CBC
Hemoglobin: 12.9
RBC: 4.56

## 2011-03-15 LAB — DIFFERENTIAL
Lymphocytes Relative: 39
Lymphs Abs: 3.7
Monocytes Absolute: 0.7
Monocytes Relative: 7
Neutro Abs: 4.8

## 2011-03-15 LAB — POCT CARDIAC MARKERS
CKMB, poc: 1.1
Myoglobin, poc: 28.9

## 2011-03-15 LAB — POCT I-STAT CREATININE: Operator id: 294511

## 2011-03-18 ENCOUNTER — Emergency Department (HOSPITAL_COMMUNITY)
Admission: EM | Admit: 2011-03-18 | Discharge: 2011-03-18 | Discharge status: Against Medical Advice | Payer: Medicare Other | Attending: Emergency Medicine | Admitting: Emergency Medicine

## 2011-03-18 DIAGNOSIS — M549 Dorsalgia, unspecified: Secondary | ICD-10-CM | POA: Insufficient documentation

## 2011-03-18 DIAGNOSIS — M545 Low back pain, unspecified: Secondary | ICD-10-CM | POA: Insufficient documentation

## 2011-04-14 ENCOUNTER — Emergency Department (HOSPITAL_COMMUNITY): Payer: Medicare Other

## 2011-04-14 ENCOUNTER — Emergency Department (HOSPITAL_COMMUNITY)
Admission: EM | Admit: 2011-04-14 | Discharge: 2011-04-14 | Disposition: A | Discharge status: Home / Self Care | Payer: Medicare Other | Attending: Emergency Medicine | Admitting: Emergency Medicine

## 2011-04-14 DIAGNOSIS — I10 Essential (primary) hypertension: Secondary | ICD-10-CM | POA: Insufficient documentation

## 2011-04-14 DIAGNOSIS — M25569 Pain in unspecified knee: Secondary | ICD-10-CM | POA: Insufficient documentation

## 2011-04-14 DIAGNOSIS — M79609 Pain in unspecified limb: Secondary | ICD-10-CM | POA: Insufficient documentation

## 2011-04-14 DIAGNOSIS — Z79899 Other long term (current) drug therapy: Secondary | ICD-10-CM | POA: Insufficient documentation

## 2011-04-14 DIAGNOSIS — M7989 Other specified soft tissue disorders: Secondary | ICD-10-CM | POA: Insufficient documentation

## 2011-04-14 DIAGNOSIS — IMO0001 Reserved for inherently not codable concepts without codable children: Secondary | ICD-10-CM | POA: Insufficient documentation

## 2011-04-14 DIAGNOSIS — M129 Arthropathy, unspecified: Secondary | ICD-10-CM | POA: Insufficient documentation

## 2011-04-14 DIAGNOSIS — M25469 Effusion, unspecified knee: Secondary | ICD-10-CM | POA: Insufficient documentation

## 2011-09-03 ENCOUNTER — Encounter (HOSPITAL_COMMUNITY): Payer: Self-pay | Admitting: *Deleted

## 2011-09-03 ENCOUNTER — Other Ambulatory Visit: Payer: Self-pay

## 2011-09-03 ENCOUNTER — Emergency Department (HOSPITAL_COMMUNITY): Payer: Medicare Other

## 2011-09-03 ENCOUNTER — Emergency Department (HOSPITAL_COMMUNITY)
Admission: EM | Admit: 2011-09-03 | Discharge: 2011-09-03 | Disposition: A | Discharge status: Home Health | Payer: Medicare Other | Attending: Emergency Medicine | Admitting: Emergency Medicine

## 2011-09-03 DIAGNOSIS — I10 Essential (primary) hypertension: Secondary | ICD-10-CM | POA: Insufficient documentation

## 2011-09-03 DIAGNOSIS — M5030 Other cervical disc degeneration, unspecified cervical region: Secondary | ICD-10-CM | POA: Insufficient documentation

## 2011-09-03 DIAGNOSIS — M546 Pain in thoracic spine: Secondary | ICD-10-CM | POA: Insufficient documentation

## 2011-09-03 DIAGNOSIS — G8929 Other chronic pain: Secondary | ICD-10-CM | POA: Insufficient documentation

## 2011-09-03 DIAGNOSIS — M542 Cervicalgia: Secondary | ICD-10-CM | POA: Insufficient documentation

## 2011-09-03 DIAGNOSIS — M25519 Pain in unspecified shoulder: Secondary | ICD-10-CM | POA: Insufficient documentation

## 2011-09-03 DIAGNOSIS — M5412 Radiculopathy, cervical region: Secondary | ICD-10-CM | POA: Insufficient documentation

## 2011-09-03 DIAGNOSIS — E785 Hyperlipidemia, unspecified: Secondary | ICD-10-CM | POA: Insufficient documentation

## 2011-09-03 DIAGNOSIS — IMO0002 Reserved for concepts with insufficient information to code with codable children: Secondary | ICD-10-CM

## 2011-09-03 HISTORY — DX: Essential (primary) hypertension: I10

## 2011-09-03 HISTORY — DX: Depression, unspecified: F32.A

## 2011-09-03 HISTORY — DX: Major depressive disorder, single episode, unspecified: F32.9

## 2011-09-03 HISTORY — DX: Other chronic pain: G89.29

## 2011-09-03 HISTORY — DX: Anxiety disorder, unspecified: F41.9

## 2011-09-03 HISTORY — DX: Unspecified osteoarthritis, unspecified site: M19.90

## 2011-09-03 HISTORY — DX: Hyperlipidemia, unspecified: E78.5

## 2011-09-03 LAB — POCT I-STAT, CHEM 8
Calcium, Ion: 1.2 mmol/L (ref 1.12–1.32)
Creatinine, Ser: 0.7 mg/dL (ref 0.50–1.10)
Glucose, Bld: 83 mg/dL (ref 70–99)
Hemoglobin: 12.2 g/dL (ref 12.0–15.0)
Sodium: 142 mEq/L (ref 135–145)
TCO2: 27 mmol/L (ref 0–100)

## 2011-09-03 NOTE — ED Notes (Signed)
Patient states she had onset of shoulder pain Saturday that has progressed to both shoulders, upper back, and neck pain.  Patient has increased pain with any movement.  Patient states she does not feel like eating

## 2011-09-03 NOTE — Discharge Instructions (Signed)
Degenerative Disc Disease Degenerative disc disease is a condition caused by the changes that occur in the cushions of the backbone (spinal discs) as you grow older. Spinal discs are soft and compressible discs located between the bones of the spine (vertebrae). They act like shock absorbers. Degenerative disc disease can affect the wholespine. However, the neck and lower back are most commonly affected. Many changes can occur in the spinal discs with aging, such as:  The spinal discs may dry and shrink.   Small tears may occur in the tough, outer covering of the disc (annulus).   The disc space may become smaller due to loss of water.   Abnormal growths in the bone (spurs) may occur. This can put pressure on the nerve roots exiting the spinal canal, causing pain.   The spinal canal may become narrowed.  CAUSES  Degenerative disc disease is a condition caused by the changes that occur in the spinal discs with aging. The exact cause is not known, but there is a genetic basis for many patients. Degenerative changes can occur due to loss of fluid in the disc. This makes the disc thinner and reduces the space between the backbones. Small cracks can develop in the outer layer of the disc. This can lead to the breakdown of the disc. You are more likely to get degenerative disc disease if you are overweight. Smoking cigarettes and doing heavy work such as weightlifting can also increase your risk of this condition. Degenerative changes can start after a sudden injury. Growth of bone spurs can compress the nerve roots and cause pain.  SYMPTOMS  The symptoms vary from person to person. Some people may have no pain, while others have severe pain. The pain may be so severe that it can limit your activities. The location of the pain depends on the part of your backbone that is affected. You will have neck or arm pain if a disc in the neck area is affected. You will have pain in your back, buttocks, or legs if a  disc in the lower back is affected. The pain becomes worse while bending, reaching up, or with twisting movements. The pain may start gradually and then get worse as time passes. It may also start after a major or minor injury. You may feel numbness or tingling in the arms or legs.  DIAGNOSIS  Your caregiver will ask you about your symptoms and about activities or habits that may cause the pain. He or she may also ask about any injuries, diseases, ortreatments you have had earlier. Your caregiver will examine you to check for the range of movement that is possible in the affected area, to check for strength in your extremities, and to check for sensation in the areas of the arms and legs supplied by different nerve roots. An X-ray of the spine may be taken. Your caregiver may suggest other imaging tests, such as a computerized magnetic scan (MRI), if needed.  TREATMENT  Treatment includes rest, modifying your activities, and applying ice and heat. Your caregiver may prescribe medicines to reduce your pain and may ask you to do some exercises to strengthen your back. In some cases, you may need surgery. You and your caregiver will decide on the treatment that is best for you. HOME CARE INSTRUCTIONS   Follow proper lifting and walking techniques as advised by your caregiver.   Maintain good posture.   Exercise regularly as advised.   Perform relaxation exercises.   Change your sitting,   standing, and sleeping habits as advised. Change positions frequently.   Lose weight as advised.   Stop smoking if you smoke.   Wear supportive footwear.  SEEK MEDICAL CARE IF:  The pain does not go away within 1 to 4 weeks. SEEK IMMEDIATE MEDICAL CARE IF:   The pain is severe.   You notice weakness in your arms, hands, or legs.   You begin to lose control of your bladder or bowel.  MAKE SURE YOU:   Understand these instructions.   Will watch your condition.   Will get help right away if you are not  doing well or get worse.  Document Released: 04/07/2007 Document Revised: 05/30/2011 Document Reviewed: 04/07/2007 [facility] Patient Information 2012 [place], [place].Cervical Radiculopathy Cervical radiculopathy happens when a nerve in the neck is pinched or bruised by a slipped (herniated) disk or by arthritic changes in the bones of the cervical spine. This can occur due to an injury or as part of the normal aging process. Pressure on the cervical nerves can cause pain or numbness that runs from your neck all the way down into your arm and fingers. CAUSES  There are many possible causes, including:  Injury.   Muscle tightness in the neck from overuse.   Swollen, painful joints (arthritis).   Breakdown or degeneration in the bones and joints of the spine (spondylosis) due to aging.   Bone spurs that may develop near the cervical nerves.  SYMPTOMS  Symptoms include pain, weakness, or numbness in the affected arm and hand. Pain can be severe or irritating. Symptoms may be worse when extending or turning the neck. DIAGNOSIS  Your caregiver will ask about your symptoms and do a physical exam. He or she may test your strength and reflexes. X-rays, CT scans, and MRI scans may be needed in cases of injury or if the symptoms do not go away after a period of time. Electromyography (EMG) or nerve conduction testing may be done to study how your nerves and muscles are working. TREATMENT  Your caregiver may recommend certain exercises to help relieve your symptoms. Cervical radiculopathy can, and often does, get better with time and treatment. If your problems continue, treatment options may include:  Wearing a soft collar for short periods of time.   Physical therapy to strengthen the neck muscles.   Medicines, such as nonsteroidal anti-inflammatory drugs (NSAIDs), oral corticosteroids, or spinal injections.   Surgery. Different types of surgery may be done depending on the cause of your problems.    HOME CARE INSTRUCTIONS   Put ice on the affected area.   Put ice in a plastic bag.   Place a towel between your skin and the bag.   Leave the ice on for 15 to 20 minutes, 3 to 4 times a day or as directed by your caregiver.   Use a flat pillow when you sleep.   Only take over-the-counter or prescription medicines for pain, discomfort, or fever as directed by your caregiver.   If physical therapy was prescribed, follow your caregiver's directions.   If a soft collar was prescribed, use it as directed.  SEEK IMMEDIATE MEDICAL CARE IF:   Your pain gets much worse and cannot be controlled with medicines.   You have weakness or numbness in your hand, arm, face, or leg.   You have a high fever or a stiff, rigid neck.   You lose bowel or bladder control (incontinence).   You have trouble with walking, balance, or speaking.  MAKE SURE YOU:   Understand these instructions.   Will watch your condition.   Will get help right away if you are not doing well or get worse.  Document Released: 03/05/2001 Document Revised: 05/30/2011 Document Reviewed: 01/22/2011 [facility] Patient Information 2012 [place], [place].

## 2011-09-03 NOTE — ED Provider Notes (Signed)
History     CSN: [id number]  Arrival date & time 09/03/11  1415   First MD Initiated Contact with Patient 09/03/11 1451      Chief Complaint  Patient presents with  . Back Pain  . Shoulder Pain   pleasant patient with a known history of chronic pain, arthritis, hypertension. She began having pain across her upper shoulder blades 2 days ago. This pain also radiates upwards towards the trapezius and neck area. Now having upper back and neck pain and pain in both shoulders. The pain is increased with any sort of movement. She states she is on morphine at home for her chronic pain and does not need any prescriptions. She's had no numbness, weakness or tingling. No chest pain. No shortness of breath. No nausea, vomiting. She did tell the triage nurse, that she has somewhat decreased appetite. Patient states this feels most like her chronic pain, but somewhat worse. No dizziness or syncope.  (Consider location/radiation/quality/duration/timing/severity/associated sxs/prior treatment) HPI  Past Medical History  Diagnosis Date  . Hypertension   . Hyperlipemia   . Chronic pain   . Arthritis   . Anxiety   . Depression     Past Surgical History  Procedure Date  . Cesarean section     No family history on file.  History  Substance Use Topics  . Smoking status: Current Everyday Smoker  . Smokeless tobacco: Not on file  . Alcohol Use: Yes    OB History    Grav Para Term Preterm Abortions TAB SAB Ect Mult Living                  Review of Systems  All other systems reviewed and are negative.    Allergies  Review of patient's allergies indicates no known allergies.  Home Medications   Current Outpatient Rx  Name Route Sig Dispense Refill  . HYDROCHLOROTHIAZIDE 25 MG PO TABS Oral Take 25 mg by mouth daily.      BP 145/75  Pulse 70  Temp(Src) 98 F (36.7 C) (Oral)  Resp 16  SpO2 97%  Physical Exam  Nursing note and vitals reviewed. Constitutional: She is oriented  to person, place, and time. She appears well-developed and well-nourished.       No acute distress, obese, conversant, and jovial  HENT:  Head: Normocephalic and atraumatic.  Eyes: Conjunctivae and EOM are normal. Pupils are equal, round, and reactive to light.  Neck: Neck supple.       Neck is supple. Bilateral paracervical tenderness to palpation of trapezius tenderness  Cardiovascular: Normal rate and regular rhythm.  Exam reveals no gallop and no friction rub.   No murmur heard. Pulmonary/Chest: Breath sounds normal. She has no wheezes. She has no rales. She exhibits no tenderness.  Abdominal: Soft. Bowel sounds are normal. She exhibits no distension. There is no tenderness. There is no rebound and no guarding.  Musculoskeletal: Normal range of motion.       Diffuse tenderness across the upper back. No spinal tenderness. No redness or swelling.  Neurological: She is alert and oriented to person, place, and time. No cranial nerve deficit. Coordination normal.  Skin: Skin is warm and dry. No rash noted.  Psychiatric: She has a normal mood and affect.    ED Course  Procedures (including critical care time)  Labs Reviewed - No data to display No results found.   No diagnosis found.    MDM  Pt is seen and examined;  Initial history  and physical completed.  Will follow.       Suspect bulging disc or degenerative disc disease. Will check plain x-rays basic labs and EKG and reassess.    Date: 09/03/2011  Rate: 60  Rhythm: normal sinus rhythm  QRS Axis: normal  Intervals: normal  ST/T Wave abnormalities: normal  Conduction Disutrbances:none  Narrative Interpretation:   Old EKG Reviewed: unchanged   Results for orders placed during the hospital encounter of 09/03/11  POCT I-STAT TROPONIN I      Component Value Range   Troponin i, poc 0.00  0.00 - 0.08 (ng/mL)   Comment 3           POCT I-STAT, CHEM 8      Component Value Range   Sodium 142  135 - 145 (mEq/L)    Potassium 3.5  3.5 - 5.1 (mEq/L)   Chloride 104  96 - 112 (mEq/L)   BUN 11  6 - 23 (mg/dL)   Creatinine, Ser 6.21  0.50 - 1.10 (mg/dL)   Glucose, Bld 83  70 - 99 (mg/dL)   Calcium, Ion 3.08  1.12 - 1.32 (mmol/L)   TCO2 27  0 - 100 (mmol/L)   Hemoglobin 12.2  12.0 - 15.0 (g/dL)   HCT 65.7  84.6 - 96.2 (%)   Dg Chest 2 View  09/03/2011  *RADIOLOGY REPORT*  Clinical Data: Neck and back pain.  The pain radiates into the anterior aspect of the chest.  CHEST - 2 VIEW  Comparison: 01/10/2011  Findings: Mild cardiomegaly. Pulmonary vascularity is normal. Slight scarring at the left lung base, stable.  Lungs are otherwise clear.  Degenerative spurs throughout the thoracic spine, unchanged.  IMPRESSION: No acute abnormalities.  Original Report Authenticated By: Gwynn Burly, M.D.   Dg Cervical Spine Complete  09/03/2011  *RADIOLOGY REPORT*  Clinical Data: Neck pain and limited range of motion.  CERVICAL SPINE - COMPLETE 4+ VIEW  Comparison: 11/11/2008  Findings: There is slight facet arthritis at C2-3 and moderate facet arthritis at C7-T1 on the right. The patient has prominent uncinate spurring at C4-5, right greater than left. Minimal disc space narrowing at C4-5.  Calcifications of the tips of the spinous processes at C7 and T1 and in the posterior soft tissues at C5-6 are unchanged and felt to represent the sequela of prior injury.  IMPRESSION: No significant change in the appearance of the cervical spine since 11/11/2008.  Degenerative disc disease at C4-5.  Multilevel degenerative facet arthritis.  Original Report Authenticated By: Gwynn Burly, M.D.       C-spine x-rays have been reviewed. No significant changes. Degenerative disc disease at C4-C5.  Plan patient can followup with Dr. Ethelene Hal. Her pain specialist. She does have morphine at home, which she takes for her pain. She was told to continue her medications as prescribed. Only.    Cardell Rachel A. Patrica Duel, MD 09/03/11 1728

## 2011-09-24 ENCOUNTER — Encounter (HOSPITAL_COMMUNITY): Payer: Self-pay | Admitting: *Deleted

## 2011-09-24 ENCOUNTER — Other Ambulatory Visit: Payer: Self-pay

## 2011-09-24 ENCOUNTER — Emergency Department (HOSPITAL_COMMUNITY)
Admission: EM | Admit: 2011-09-24 | Discharge: 2011-09-25 | Disposition: A | Discharge status: Home / Self Care | Payer: Medicare Other | Attending: Emergency Medicine | Admitting: Emergency Medicine

## 2011-09-24 DIAGNOSIS — F341 Dysthymic disorder: Secondary | ICD-10-CM | POA: Insufficient documentation

## 2011-09-24 DIAGNOSIS — I10 Essential (primary) hypertension: Secondary | ICD-10-CM | POA: Insufficient documentation

## 2011-09-24 DIAGNOSIS — M129 Arthropathy, unspecified: Secondary | ICD-10-CM | POA: Insufficient documentation

## 2011-09-24 DIAGNOSIS — M79609 Pain in unspecified limb: Secondary | ICD-10-CM | POA: Insufficient documentation

## 2011-09-24 DIAGNOSIS — M25559 Pain in unspecified hip: Secondary | ICD-10-CM | POA: Insufficient documentation

## 2011-09-24 DIAGNOSIS — M79606 Pain in leg, unspecified: Secondary | ICD-10-CM

## 2011-09-24 DIAGNOSIS — M25569 Pain in unspecified knee: Secondary | ICD-10-CM | POA: Insufficient documentation

## 2011-09-24 DIAGNOSIS — M549 Dorsalgia, unspecified: Secondary | ICD-10-CM

## 2011-09-24 DIAGNOSIS — F17200 Nicotine dependence, unspecified, uncomplicated: Secondary | ICD-10-CM | POA: Insufficient documentation

## 2011-09-24 DIAGNOSIS — R209 Unspecified disturbances of skin sensation: Secondary | ICD-10-CM | POA: Insufficient documentation

## 2011-09-24 DIAGNOSIS — G8929 Other chronic pain: Secondary | ICD-10-CM

## 2011-09-24 DIAGNOSIS — Z79899 Other long term (current) drug therapy: Secondary | ICD-10-CM | POA: Insufficient documentation

## 2011-09-24 DIAGNOSIS — M542 Cervicalgia: Secondary | ICD-10-CM | POA: Insufficient documentation

## 2011-09-24 DIAGNOSIS — M25562 Pain in left knee: Secondary | ICD-10-CM

## 2011-09-24 DIAGNOSIS — E785 Hyperlipidemia, unspecified: Secondary | ICD-10-CM | POA: Insufficient documentation

## 2011-09-24 NOTE — ED Notes (Signed)
Pt changed into gown and gave pt blanket.

## 2011-09-24 NOTE — ED Notes (Signed)
The pt has had rt thigh numbness for 4 days. Neck and shoulder and neck pain and multiple other pains

## 2011-09-25 MED ORDER — PREDNISONE 20 MG PO TABS
60.0000 mg | ORAL_TABLET | Freq: Once | ORAL | Status: AC
  Administered 2011-09-25: 60 mg via ORAL
  Filled 2011-09-25: qty 3

## 2011-09-25 MED ORDER — HYDROCODONE-ACETAMINOPHEN 5-325 MG PO TABS: 2.0000 | ORAL_TABLET | ORAL | Status: AC | PRN

## 2011-09-25 MED ORDER — CYCLOBENZAPRINE HCL 10 MG PO TABS: 10.0000 mg | ORAL_TABLET | Freq: Three times a day (TID) | ORAL | Status: AC | PRN

## 2011-09-25 MED ORDER — PREDNISONE 20 MG PO TABS: 40.0000 mg | ORAL_TABLET | Freq: Every day | ORAL | Status: AC

## 2011-09-25 NOTE — Discharge Instructions (Signed)
Please review the instructions below.  You were seen tonight in the emergency department for your multiple complaints including low back pain, right leg pain, right hip pain and neck pain.  You do admit that these pains are chronic and ongoing.  We have prescribed a short course of Prednisone, muscle relaxers and medication for pain.  Take medications as directed and [facility] and with her orthopedist for further followup and management of your chronic pain.     Arthralgia Arthralgia is joint pain. A joint is a place where two bones meet. Joint pain can happen for many reasons. The joint can be bruised, stiff, infected, or weak from aging. Pain usually goes away after resting and taking medicine for soreness.  HOME CARE  Rest the joint as told by your doctor.   Keep the sore joint raised (elevated) for the first 24 hours.   Put ice on the joint area.   Put ice in a plastic bag.   Place a towel between your skin and the bag.   Leave the ice on for 15 to 20 minutes, 3 to 4 times a day.   Wear your splint, casting, elastic bandage, or sling as told by your doctor.   Only take medicine as told by your doctor. Do not take aspirin.   Use crutches as told by your doctor. Do not put weight on the joint until told to by your doctor.  GET HELP RIGHT AWAY IF:   You have bruising, puffiness (swelling), or more pain.   Your fingers or toes turn blue or start to lose feeling (numb).   Your medicine does not lessen the pain.   Your pain becomes severe.   You have a temperature by mouth above 102 F (38.9 C), not controlled by medicine.   You cannot move or use the joint.  MAKE SURE YOU:   Understand these instructions.   Will watch your condition.   Will get help right away if you are not doing well or get worse.  Document Released: 05/29/2009 Document Revised: 05/30/2011 Document Reviewed: 05/29/2009 [facility] Patient Information 2012  [place], [place].Pain Management, Chronic You have a painful condition that has required frequent use of narcotic-type pain medicine. We would like to see that you receive the best possible care for your problem. To achieve this, you must have a personal physician who can supervise a treatment plan for you. You may locate a personal physician on your own or contact one of the doctors whose name has been given to you. If your physician determines that you need to visit the Emergency Department for pain control, that doctor should provide you with a pain contract. This is a letter from your doctor which describes what pain medicine you may receive, how much and how often. You sign it agreeing to the terms of the treatment plan. Bring this with each time you come to this facility. It will help the Emergency Physician provide the proper treatment for you with minimal delay. Please Note: In the future you may not be able to receive narcotic pain medicine from this facility without a pain contract or telephone approval from your personal physician. Document Released: 02/01/2004 Document Revised: 05/30/2011 Document Reviewed: 06/06/2008 [facility] Patient Information 2012 [place], [place].Chronic Back Pain When back pain lasts longer than 3 months, it is called chronic back pain.This pain can be frustrating, but the cause of the pain is rarely dangerous.People with chronic back pain often go through certain  periods that are more intense (flare-ups). CAUSES Chronic back pain can be caused by wear and tear (degeneration) on different structures in your back. These structures may include bones, ligaments, or discs. This degeneration may result in more pressure being placed on the nerves that travel to your legs and feet. This can lead to pain traveling from the low back down the back of the legs. When pain lasts longer than 3 months, it is not unusual for people to experience anxiety or depression. Anxiety and depression  can also contribute to low back pain. TREATMENT  Establish a regular exercise plan. This is critical to improving your functional level.   Have a self-management plan for when you flare-up. Flare-ups rarely require a medical visit. Regular exercise will help reduce the intensity and frequency of your flare-ups.   Manage how you feel about your back pain and the rest of your life. Anxiety, depression, and feeling that you cannot alter your back pain have been shown to make back pain more intense and debilitating.   Medicines should never be your only treatment. They should be used along with other treatments to help you return to a more active lifestyle.   Procedures such as injections or surgery may be helpful but are rarely necessary. You may be able to get the same results with physical therapy or chiropractic care.  HOME CARE INSTRUCTIONS  Avoid bending, heavy lifting, prolonged sitting, and activities which make the problem worse.   Continue normal activity as much as possible.   Take brief periods of rest throughout the day to reduce your pain during flare-ups.   Follow your back exercise rehabilitation program. This can help reduce symptoms and prevent more pain.   Only take over-the-counter or prescription medicines as directed by your caregiver. Muscle relaxants are sometimes prescribed. Narcotic pain medicine is discouraged for long-term pain, since addiction is a possible outcome.   If you smoke, quit.   Eat healthy foods and maintain a recommended body weight.  SEEK IMMEDIATE MEDICAL CARE IF:   You have weakness or numbness in one of your legs or feet.   You have trouble controlling your bladder or bowels.   You develop nausea, vomiting, abdominal pain, shortness of breath, or fainting.  Document Released: 07/18/2004 Document Revised: 05/30/2011 Document Reviewed: 05/25/2011 [facility] Patient Information 2012 [place], [place].

## 2011-09-25 NOTE — ED Provider Notes (Signed)
History     CSN: [id number]  Arrival date & time 09/24/11  2124   First MD Initiated Contact with Patient 09/24/11 2356      Chief Complaint  Patient presents with  . rt thigh numbness      Patient is a 61 y.o. female presenting with back pain. The history is provided by the patient.  Back Pain  This is a chronic problem. The current episode started more than 1 week ago. The problem occurs constantly. The problem has been gradually worsening. The pain is associated with no known injury. The pain is present in the lumbar spine. The pain radiates to the right thigh. The pain is at a severity of 10/10. The pain is severe. The symptoms are aggravated by bending and certain positions. The pain is the same all the time. Stiffness is present in the morning. Associated symptoms include numbness. Pertinent negatives include no fever, no bowel incontinence, no perianal numbness, no bladder incontinence, no dysuria, no leg pain, no paresis, no tingling and no weakness. The treatment provided no relief.  Pt reports worsening LBP that radiates into (R) leg (anterior thigh). States she also has a small area on her anterior thigh that feels intermittently numb and burning. Admits to hx of severe arthritis and DJD and that pain is chronic. States her hydrocodone/ApAP is not helping. For the last several days pain has kept her from sleeping. States she was trying to wait until her scheduled appointment w/ her orthopedists but pain got worse,   Past Medical History  Diagnosis Date  . Hypertension   . Hyperlipemia   . Chronic pain   . Arthritis   . Anxiety   . Depression     Past Surgical History  Procedure Date  . Cesarean section     No family history on file.  History  Substance Use Topics  . Smoking status: Current Everyday Smoker  . Smokeless tobacco: Not on file  . Alcohol Use: Yes    OB History    Grav Para Term Preterm Abortions TAB SAB Ect Mult Living                  Review of  Systems  Constitutional: Negative.  Negative for fever.  HENT: Negative.   Eyes: Negative.   Respiratory: Negative.   Cardiovascular: Negative.   Gastrointestinal: Negative.  Negative for bowel incontinence.  Genitourinary: Negative.  Negative for bladder incontinence and dysuria.  Musculoskeletal: Positive for back pain.  Skin: Negative.   Neurological: Positive for numbness. Negative for tingling and weakness.  Hematological: Negative.   Psychiatric/Behavioral: Negative.     Allergies  Review of patient's allergies indicates no known allergies.  Home Medications   Current Outpatient Rx  Name Route Sig Dispense Refill  . AMLODIPINE BESYLATE 5 MG PO TABS Oral Take 5 mg by mouth daily.    Marland Kitchen BENAZEPRIL HCL 20 MG PO TABS Oral Take 20 mg by mouth daily.    . BUPROPION HCL ER (XL) 150 MG PO TB24 Oral Take 150 mg by mouth at bedtime.    Marland Kitchen HYDROCHLOROTHIAZIDE 25 MG PO TABS Oral Take 25 mg by mouth daily.    . MELOXICAM 7.5 MG PO TABS Oral Take 7.5 mg by mouth at bedtime.    . OMEPRAZOLE 20 MG PO CPDR Oral Take 20 mg by mouth at bedtime.    Marland Kitchen SIMVASTATIN 20 MG PO TABS Oral Take 20 mg by mouth at bedtime.      BP  148/93  Pulse 68  Temp(Src) 98.2 F (36.8 C) (Oral)  Resp 20  SpO2 100%  Physical Exam  Constitutional: She is oriented to person, place, and time. She appears well-developed and well-nourished.  HENT:  Head: Normocephalic and atraumatic.  Eyes: Conjunctivae are normal.  Neck: Neck supple.  Cardiovascular: Normal rate and regular rhythm.   Pulmonary/Chest: Effort normal and breath sounds normal.  Abdominal: Soft. Bowel sounds are normal.  Musculoskeletal: Normal range of motion.       Back:       LBP TTP R>L  Neurological: She is alert and oriented to person, place, and time. She has normal strength and normal reflexes. Coordination normal.  Skin: Skin is warm and dry. No erythema.  Psychiatric: She has a normal mood and affect.    ED Course  Procedures  Pt  started on Prednisone in ED. States she is driving so can't take sedating meds. States she will take at home. Findings and clinical impression discussed w/ pt. Will plan for d/c home w/ medication for pain, Prednisone and muscle relaxer and encourage pt to keep her scheduled appointment w/ her orthopedist (Dr Ethelene Hal) and PCP at [facility]. Pt agreeable w/ plan.  Labs Reviewed - No data to display No results found.   No diagnosis found.    MDM  HPI/PE and clinical findings c/w 1. Chronic LBP 2. Chronic joint pain  H/o significant arthritis and DJD. Under care of orthopedist for same. No weakness of either LE or other focal neurological deficits identified.  Will continue prednisone x 1 week and add valium to her Hydrocodone regimen. Pt f/u w/ PCP and orthopedist as scheduled in a week.        Leanne Chang, NP 09/27/11 [phone]

## 2011-09-25 NOTE — ED Notes (Signed)
Pt resting in stretcher in NAD, respirations even and unlabored. 

## 2011-10-01 NOTE — ED Provider Notes (Signed)
Medical screening examination/treatment/procedure(s) were performed by non-physician practitioner and as supervising physician I was immediately available for consultation/collaboration.  Donnetta Hutching, MD 10/01/11 [phone]

## 2011-10-16 ENCOUNTER — Ambulatory Visit: Payer: Medicare Other | Attending: Family Medicine | Admitting: Physical Therapy

## 2011-10-16 DIAGNOSIS — M545 Low back pain, unspecified: Secondary | ICD-10-CM | POA: Insufficient documentation

## 2011-10-16 DIAGNOSIS — IMO0001 Reserved for inherently not codable concepts without codable children: Secondary | ICD-10-CM | POA: Insufficient documentation

## 2011-10-16 DIAGNOSIS — M2560 Stiffness of unspecified joint, not elsewhere classified: Secondary | ICD-10-CM | POA: Insufficient documentation

## 2011-10-16 DIAGNOSIS — M25559 Pain in unspecified hip: Secondary | ICD-10-CM | POA: Insufficient documentation

## 2011-10-22 ENCOUNTER — Other Ambulatory Visit (HOSPITAL_COMMUNITY): Payer: Self-pay | Admitting: Family Medicine

## 2011-10-22 DIAGNOSIS — Z1231 Encounter for screening mammogram for malignant neoplasm of breast: Secondary | ICD-10-CM

## 2011-11-12 ENCOUNTER — Ambulatory Visit: Payer: Medicare Other | Attending: Family Medicine | Admitting: Physical Therapy

## 2011-11-12 DIAGNOSIS — M545 Low back pain, unspecified: Secondary | ICD-10-CM | POA: Insufficient documentation

## 2011-11-12 DIAGNOSIS — M2560 Stiffness of unspecified joint, not elsewhere classified: Secondary | ICD-10-CM | POA: Insufficient documentation

## 2011-11-12 DIAGNOSIS — M25559 Pain in unspecified hip: Secondary | ICD-10-CM | POA: Insufficient documentation

## 2011-11-12 DIAGNOSIS — IMO0001 Reserved for inherently not codable concepts without codable children: Secondary | ICD-10-CM | POA: Insufficient documentation

## 2011-11-14 ENCOUNTER — Ambulatory Visit: Payer: Medicare Other | Admitting: Physical Therapy

## 2011-11-20 ENCOUNTER — Ambulatory Visit: Payer: Medicare Other | Admitting: Physical Therapy

## 2011-11-21 ENCOUNTER — Ambulatory Visit: Payer: Medicare Other | Admitting: Physical Therapy

## 2011-11-22 ENCOUNTER — Ambulatory Visit (HOSPITAL_COMMUNITY)
Admission: RE | Admit: 2011-11-22 | Discharge: 2011-11-22 | Disposition: A | Discharge status: Home / Self Care | Payer: Medicare Other | Source: Ambulatory Visit | Attending: Family Medicine | Admitting: Family Medicine

## 2011-11-22 DIAGNOSIS — Z1231 Encounter for screening mammogram for malignant neoplasm of breast: Secondary | ICD-10-CM | POA: Insufficient documentation

## 2011-11-26 ENCOUNTER — Ambulatory Visit: Payer: Medicare Other | Attending: Family Medicine | Admitting: Physical Therapy

## 2011-11-26 DIAGNOSIS — M25559 Pain in unspecified hip: Secondary | ICD-10-CM | POA: Insufficient documentation

## 2011-11-26 DIAGNOSIS — IMO0001 Reserved for inherently not codable concepts without codable children: Secondary | ICD-10-CM | POA: Insufficient documentation

## 2011-11-26 DIAGNOSIS — M545 Low back pain, unspecified: Secondary | ICD-10-CM | POA: Insufficient documentation

## 2011-11-26 DIAGNOSIS — M2560 Stiffness of unspecified joint, not elsewhere classified: Secondary | ICD-10-CM | POA: Insufficient documentation

## 2011-11-28 ENCOUNTER — Encounter: Payer: Medicare Other | Admitting: Physical Therapy

## 2011-12-04 ENCOUNTER — Ambulatory Visit: Payer: Medicare Other | Admitting: Physical Therapy

## 2011-12-11 ENCOUNTER — Ambulatory Visit: Payer: Medicare Other | Admitting: Physical Therapy

## 2011-12-13 ENCOUNTER — Ambulatory Visit: Payer: Medicare Other | Admitting: Physical Therapy

## 2011-12-16 ENCOUNTER — Encounter: Payer: Medicare Other | Admitting: Physical Therapy

## 2011-12-18 ENCOUNTER — Encounter: Payer: Medicare Other | Admitting: Physical Therapy

## 2011-12-24 ENCOUNTER — Encounter: Payer: Medicare Other | Admitting: Physical Therapy

## 2011-12-27 ENCOUNTER — Encounter: Payer: Medicare Other | Admitting: Physical Therapy

## 2012-01-16 ENCOUNTER — Emergency Department (HOSPITAL_COMMUNITY)
Admission: EM | Admit: 2012-01-16 | Discharge: 2012-01-16 | Disposition: A | Discharge status: Home / Self Care | Payer: Medicare Other | Attending: Emergency Medicine | Admitting: Emergency Medicine

## 2012-01-16 ENCOUNTER — Emergency Department (HOSPITAL_COMMUNITY): Payer: Medicare Other

## 2012-01-16 ENCOUNTER — Encounter (HOSPITAL_COMMUNITY): Payer: Self-pay | Admitting: Emergency Medicine

## 2012-01-16 DIAGNOSIS — M129 Arthropathy, unspecified: Secondary | ICD-10-CM | POA: Insufficient documentation

## 2012-01-16 DIAGNOSIS — I10 Essential (primary) hypertension: Secondary | ICD-10-CM | POA: Insufficient documentation

## 2012-01-16 DIAGNOSIS — F329 Major depressive disorder, single episode, unspecified: Secondary | ICD-10-CM | POA: Insufficient documentation

## 2012-01-16 DIAGNOSIS — E785 Hyperlipidemia, unspecified: Secondary | ICD-10-CM | POA: Insufficient documentation

## 2012-01-16 DIAGNOSIS — F3289 Other specified depressive episodes: Secondary | ICD-10-CM | POA: Insufficient documentation

## 2012-01-16 DIAGNOSIS — G8929 Other chronic pain: Secondary | ICD-10-CM | POA: Insufficient documentation

## 2012-01-16 DIAGNOSIS — R0789 Other chest pain: Secondary | ICD-10-CM

## 2012-01-16 DIAGNOSIS — Z043 Encounter for examination and observation following other accident: Secondary | ICD-10-CM | POA: Insufficient documentation

## 2012-01-16 DIAGNOSIS — M545 Low back pain, unspecified: Secondary | ICD-10-CM | POA: Insufficient documentation

## 2012-01-16 DIAGNOSIS — F411 Generalized anxiety disorder: Secondary | ICD-10-CM | POA: Insufficient documentation

## 2012-01-16 DIAGNOSIS — F17200 Nicotine dependence, unspecified, uncomplicated: Secondary | ICD-10-CM | POA: Insufficient documentation

## 2012-01-16 MED ORDER — FENTANYL CITRATE 0.05 MG/ML IJ SOLN
50.0000 ug | Freq: Once | INTRAMUSCULAR | Status: AC
  Administered 2012-01-16: 50 ug via INTRAVENOUS
  Filled 2012-01-16: qty 2

## 2012-01-16 MED ORDER — OXYCODONE-ACETAMINOPHEN 5-325 MG PO TABS
2.0000 | ORAL_TABLET | Freq: Once | ORAL | Status: AC
  Administered 2012-01-16: 2 via ORAL
  Filled 2012-01-16: qty 2

## 2012-01-16 MED ORDER — LORAZEPAM 2 MG/ML IJ SOLN
0.5000 mg | Freq: Once | INTRAMUSCULAR | Status: AC
  Administered 2012-01-16: 0.5 mg via INTRAVENOUS
  Filled 2012-01-16: qty 1
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
## 2012-01-16 NOTE — ED Provider Notes (Signed)
I saw and evaluated the patient, reviewed the resident's note and I agree with the findings and plan.   .Face to face Exam:  General:  Awake HEENT:  Atraumatic Resp:  Normal effort Abd:  Nondistended Neuro:No focal weakness Lymph: No adenopathy   [name], MD 01/16/12 1721

## 2012-01-18 ENCOUNTER — Emergency Department (HOSPITAL_COMMUNITY): Payer: Medicare Other

## 2012-01-18 ENCOUNTER — Emergency Department (HOSPITAL_COMMUNITY)
Admission: EM | Admit: 2012-01-18 | Discharge: 2012-01-19 | Disposition: A | Discharge status: Home / Self Care | Payer: Medicare Other | Attending: Emergency Medicine | Admitting: Emergency Medicine

## 2012-01-18 ENCOUNTER — Encounter (HOSPITAL_COMMUNITY): Payer: Self-pay | Admitting: Emergency Medicine

## 2012-01-18 DIAGNOSIS — F329 Major depressive disorder, single episode, unspecified: Secondary | ICD-10-CM | POA: Insufficient documentation

## 2012-01-18 DIAGNOSIS — E785 Hyperlipidemia, unspecified: Secondary | ICD-10-CM | POA: Insufficient documentation

## 2012-01-18 DIAGNOSIS — I10 Essential (primary) hypertension: Secondary | ICD-10-CM | POA: Insufficient documentation

## 2012-01-18 DIAGNOSIS — R0789 Other chest pain: Secondary | ICD-10-CM

## 2012-01-18 DIAGNOSIS — M549 Dorsalgia, unspecified: Secondary | ICD-10-CM

## 2012-01-18 DIAGNOSIS — M129 Arthropathy, unspecified: Secondary | ICD-10-CM | POA: Insufficient documentation

## 2012-01-18 DIAGNOSIS — F17200 Nicotine dependence, unspecified, uncomplicated: Secondary | ICD-10-CM | POA: Insufficient documentation

## 2012-01-18 DIAGNOSIS — M5380 Other specified dorsopathies, site unspecified: Secondary | ICD-10-CM | POA: Insufficient documentation

## 2012-01-18 DIAGNOSIS — F3289 Other specified depressive episodes: Secondary | ICD-10-CM | POA: Insufficient documentation

## 2012-01-18 DIAGNOSIS — R071 Chest pain on breathing: Secondary | ICD-10-CM | POA: Insufficient documentation

## 2012-01-18 DIAGNOSIS — F411 Generalized anxiety disorder: Secondary | ICD-10-CM | POA: Insufficient documentation

## 2012-01-18 LAB — POCT I-STAT TROPONIN I

## 2012-01-18 LAB — CBC WITH DIFFERENTIAL/PLATELET
Eosinophils Relative: 3 % (ref 0–5)
HCT: 35.9 % — ABNORMAL LOW (ref 36.0–46.0)
Hemoglobin: 11 g/dL — ABNORMAL LOW (ref 12.0–15.0)
Lymphocytes Relative: 41 % (ref 12–46)
Lymphs Abs: 3.6 10*3/uL (ref 0.7–4.0)
MCV: 75.1 fL — ABNORMAL LOW (ref 78.0–100.0)
Monocytes Absolute: 0.7 10*3/uL (ref 0.1–1.0)
RBC: 4.78 MIL/uL (ref 3.87–5.11)
WBC: 8.7 10*3/uL (ref 4.0–10.5)

## 2012-01-18 LAB — BASIC METABOLIC PANEL
CO2: 29 mEq/L (ref 19–32)
Calcium: 9.9 mg/dL (ref 8.4–10.5)
Creatinine, Ser: 0.66 mg/dL (ref 0.50–1.10)
Glucose, Bld: 95 mg/dL (ref 70–99)

## 2012-01-18 MED ORDER — HYDROMORPHONE HCL PF 2 MG/ML IJ SOLN
2.0000 mg | Freq: Once | INTRAMUSCULAR | Status: AC
  Administered 2012-01-19: 2 mg via INTRAVENOUS
  Filled 2012-01-18: qty 1

## 2012-01-18 MED ORDER — DIAZEPAM 5 MG PO TABS
5.0000 mg | ORAL_TABLET | Freq: Once | ORAL | Status: AC
  Administered 2012-01-19: 5 mg via ORAL
  Filled 2012-01-18: qty 1
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
## 2012-01-19 MED ORDER — OXYCODONE-ACETAMINOPHEN 5-325 MG PO TABS: 2.0000 | ORAL_TABLET | ORAL | Status: AC | PRN

## 2012-01-19 MED ORDER — DIPHENHYDRAMINE HCL 50 MG/ML IJ SOLN: 25.0000 mg | Freq: Four times a day (QID) | INTRAMUSCULAR | Status: DC | PRN

## 2012-01-19 MED ORDER — DIAZEPAM 5 MG PO TABS: 5.0000 mg | ORAL_TABLET | Freq: Three times a day (TID) | ORAL | Status: AC | PRN
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
## 2012-05-11 ENCOUNTER — Emergency Department (HOSPITAL_COMMUNITY): Payer: Medicare Other

## 2012-05-11 ENCOUNTER — Observation Stay (HOSPITAL_COMMUNITY): Payer: Medicare Other

## 2012-05-11 ENCOUNTER — Encounter (HOSPITAL_COMMUNITY): Payer: Self-pay | Admitting: *Deleted

## 2012-05-11 ENCOUNTER — Observation Stay (HOSPITAL_COMMUNITY)
Admission: EM | Admit: 2012-05-11 | Discharge: 2012-05-12 | Disposition: A | Discharge status: Home / Self Care | Payer: Medicare Other | Attending: Internal Medicine | Admitting: Internal Medicine

## 2012-05-11 DIAGNOSIS — F3289 Other specified depressive episodes: Secondary | ICD-10-CM | POA: Insufficient documentation

## 2012-05-11 DIAGNOSIS — E785 Hyperlipidemia, unspecified: Secondary | ICD-10-CM | POA: Insufficient documentation

## 2012-05-11 DIAGNOSIS — M129 Arthropathy, unspecified: Secondary | ICD-10-CM | POA: Insufficient documentation

## 2012-05-11 DIAGNOSIS — R079 Chest pain, unspecified: Principal | ICD-10-CM | POA: Insufficient documentation

## 2012-05-11 DIAGNOSIS — F329 Major depressive disorder, single episode, unspecified: Secondary | ICD-10-CM | POA: Insufficient documentation

## 2012-05-11 DIAGNOSIS — F411 Generalized anxiety disorder: Secondary | ICD-10-CM | POA: Insufficient documentation

## 2012-05-11 DIAGNOSIS — M549 Dorsalgia, unspecified: Secondary | ICD-10-CM

## 2012-05-11 DIAGNOSIS — F1210 Cannabis abuse, uncomplicated: Secondary | ICD-10-CM | POA: Insufficient documentation

## 2012-05-11 DIAGNOSIS — Z7982 Long term (current) use of aspirin: Secondary | ICD-10-CM | POA: Insufficient documentation

## 2012-05-11 DIAGNOSIS — G8929 Other chronic pain: Secondary | ICD-10-CM | POA: Insufficient documentation

## 2012-05-11 DIAGNOSIS — Z79899 Other long term (current) drug therapy: Secondary | ICD-10-CM | POA: Insufficient documentation

## 2012-05-11 DIAGNOSIS — I10 Essential (primary) hypertension: Secondary | ICD-10-CM

## 2012-05-11 DIAGNOSIS — Z23 Encounter for immunization: Secondary | ICD-10-CM | POA: Insufficient documentation

## 2012-05-11 DIAGNOSIS — I498 Other specified cardiac arrhythmias: Secondary | ICD-10-CM

## 2012-05-11 DIAGNOSIS — R0789 Other chest pain: Secondary | ICD-10-CM | POA: Diagnosis present

## 2012-05-11 DIAGNOSIS — F17200 Nicotine dependence, unspecified, uncomplicated: Secondary | ICD-10-CM | POA: Diagnosis present

## 2012-05-11 DIAGNOSIS — R001 Bradycardia, unspecified: Secondary | ICD-10-CM | POA: Diagnosis present

## 2012-05-11 HISTORY — DX: Gastro-esophageal reflux disease without esophagitis: K21.9

## 2012-05-11 LAB — BASIC METABOLIC PANEL
BUN: 16 mg/dL (ref 6–23)
Chloride: 101 mEq/L (ref 96–112)
Creatinine, Ser: 0.68 mg/dL (ref 0.50–1.10)
GFR calc Af Amer: >90 mL/min (ref >90)
Glucose, Bld: 93 mg/dL (ref 70–99)

## 2012-05-11 LAB — CBC WITH DIFFERENTIAL/PLATELET
Basophils Absolute: 0 10*3/uL (ref 0.0–0.1)
Basophils Relative: 0 % (ref 0–1)
Eosinophils Absolute: 0.2 10*3/uL (ref 0.0–0.7)
Eosinophils Relative: 3 % (ref 0–5)
HCT: 40.6 % (ref 36.0–46.0)
Hemoglobin: 13 g/dL (ref 12.0–15.0)
Lymphocytes Relative: 47 % — ABNORMAL HIGH (ref 12–46)
Lymphs Abs: 3.3 10*3/uL (ref 0.7–4.0)
MCH: 26.5 pg (ref 26.0–34.0)
MCHC: 32 g/dL (ref 30.0–36.0)
MCV: 82.9 fL (ref 78.0–100.0)
Monocytes Absolute: 0.6 10*3/uL (ref 0.1–1.0)
Monocytes Relative: 9 % (ref 3–12)
Neutro Abs: 2.9 10*3/uL (ref 1.7–7.7)
Neutrophils Relative %: 42 % — ABNORMAL LOW (ref 43–77)
Platelets: 372 10*3/uL (ref 150–400)
RBC: 4.9 MIL/uL (ref 3.87–5.11)
RDW: 17.7 % — ABNORMAL HIGH (ref 11.5–15.5)
WBC: 7.1 10*3/uL (ref 4.0–10.5)

## 2012-05-11 LAB — CBC
HCT: 38.4 % (ref 36.0–46.0)
Hemoglobin: 12.3 g/dL (ref 12.0–15.0)
MCHC: 32 g/dL (ref 30.0–36.0)
RBC: 4.73 MIL/uL (ref 3.87–5.11)
WBC: 7.2 10*3/uL (ref 4.0–10.5)

## 2012-05-11 LAB — MAGNESIUM: Magnesium: 1.7 mg/dL (ref 1.5–2.5)

## 2012-05-11 LAB — POCT I-STAT TROPONIN I
Troponin i, poc: 0.01 ng/mL (ref 0.00–0.08)
Troponin i, poc: 0.01 ng/mL (ref 0.00–0.08)

## 2012-05-11 LAB — TSH: TSH: 1.528 u[IU]/mL (ref 0.350–4.500)

## 2012-05-11 LAB — CREATININE, SERUM
Creatinine, Ser: 0.87 mg/dL (ref 0.50–1.10)
GFR calc Af Amer: 82 mL/min — ABNORMAL LOW (ref >90)
GFR calc non Af Amer: 70 mL/min — ABNORMAL LOW (ref >90)

## 2012-05-11 LAB — PHOSPHORUS: Phosphorus: 2.8 mg/dL (ref 2.3–4.6)

## 2012-05-11 MED ORDER — ALBUTEROL SULFATE HFA 108 (90 BASE) MCG/ACT IN AERS
2.0000 | INHALATION_SPRAY | Freq: Four times a day (QID) | RESPIRATORY_TRACT | Status: DC | PRN
  Filled 2012-05-11: qty 6.7

## 2012-05-11 MED ORDER — LISINOPRIL 20 MG PO TABS
20.0000 mg | ORAL_TABLET | Freq: Every day | ORAL | Status: DC
  Administered 2012-05-11 – 2012-05-12 (×2): 20 mg via ORAL
  Filled 2012-05-11 (×2): qty 1

## 2012-05-11 MED ORDER — ONDANSETRON HCL 4 MG PO TABS: 4.0000 mg | ORAL_TABLET | Freq: Four times a day (QID) | ORAL | Status: DC | PRN

## 2012-05-11 MED ORDER — ASPIRIN EC 81 MG PO TBEC
81.0000 mg | DELAYED_RELEASE_TABLET | Freq: Every day | ORAL | Status: DC
  Administered 2012-05-12: 81 mg via ORAL
  Filled 2012-05-11: qty 1

## 2012-05-11 MED ORDER — SODIUM CHLORIDE 0.9 % IJ SOLN: 3.0000 mL | INTRAMUSCULAR | Status: DC | PRN

## 2012-05-11 MED ORDER — MORPHINE SULFATE 2 MG/ML IJ SOLN: 1.0000 mg | INTRAMUSCULAR | Status: DC | PRN

## 2012-05-11 MED ORDER — SIMVASTATIN 10 MG PO TABS
10.0000 mg | ORAL_TABLET | Freq: Every day | ORAL | Status: DC
  Administered 2012-05-11: 10 mg via ORAL
  Filled 2012-05-11 (×2): qty 1

## 2012-05-11 MED ORDER — PANTOPRAZOLE SODIUM 40 MG IV SOLR
40.0000 mg | INTRAVENOUS | Status: DC
  Administered 2012-05-11: 40 mg via INTRAVENOUS
  Filled 2012-05-11 (×2): qty 40

## 2012-05-11 MED ORDER — SODIUM CHLORIDE 0.9 % IV SOLN: 250.0000 mL | INTRAVENOUS | Status: DC | PRN

## 2012-05-11 MED ORDER — INFLUENZA VIRUS VACC SPLIT PF IM SUSP
0.5000 mL | INTRAMUSCULAR | Status: AC
  Administered 2012-05-12: 0.5 mL via INTRAMUSCULAR
  Filled 2012-05-11: qty 0.5

## 2012-05-11 MED ORDER — HYDROCHLOROTHIAZIDE 25 MG PO TABS
25.0000 mg | ORAL_TABLET | Freq: Every day | ORAL | Status: DC
  Administered 2012-05-11 – 2012-05-12 (×2): 25 mg via ORAL
  Filled 2012-05-11 (×2): qty 1

## 2012-05-11 MED ORDER — OXYCODONE HCL 5 MG PO TABS
5.0000 mg | ORAL_TABLET | Freq: Four times a day (QID) | ORAL | Status: DC | PRN
  Administered 2012-05-11: 5 mg via ORAL
  Filled 2012-05-11: qty 1

## 2012-05-11 MED ORDER — HYDRALAZINE HCL 20 MG/ML IJ SOLN: 5.0000 mg | INTRAMUSCULAR | Status: DC | PRN

## 2012-05-11 MED ORDER — ACETAMINOPHEN 650 MG RE SUPP: 650.0000 mg | Freq: Four times a day (QID) | RECTAL | Status: DC | PRN

## 2012-05-11 MED ORDER — MELOXICAM 7.5 MG PO TABS
7.5000 mg | ORAL_TABLET | Freq: Two times a day (BID) | ORAL | Status: DC
  Administered 2012-05-11 – 2012-05-12 (×2): 7.5 mg via ORAL
  Filled 2012-05-11 (×3): qty 1

## 2012-05-11 MED ORDER — SODIUM CHLORIDE 0.9 % IJ SOLN: 3.0000 mL | Freq: Two times a day (BID) | INTRAMUSCULAR | Status: DC

## 2012-05-11 MED ORDER — ASPIRIN 81 MG PO CHEW
324.0000 mg | CHEWABLE_TABLET | Freq: Once | ORAL | Status: AC
  Administered 2012-05-11: 243 mg via ORAL
  Filled 2012-05-11: qty 4

## 2012-05-11 MED ORDER — ONDANSETRON HCL 4 MG/2ML IJ SOLN: 4.0000 mg | Freq: Four times a day (QID) | INTRAMUSCULAR | Status: DC | PRN

## 2012-05-11 MED ORDER — ACETAMINOPHEN 325 MG PO TABS: 650.0000 mg | ORAL_TABLET | Freq: Four times a day (QID) | ORAL | Status: DC | PRN

## 2012-05-11 MED ORDER — METHOCARBAMOL 500 MG PO TABS
500.0000 mg | ORAL_TABLET | Freq: Three times a day (TID) | ORAL | Status: DC | PRN
  Administered 2012-05-12: 500 mg via ORAL
  Filled 2012-05-11: qty 1

## 2012-05-11 MED ORDER — SODIUM CHLORIDE 0.9 % IJ SOLN
3.0000 mL | Freq: Two times a day (BID) | INTRAMUSCULAR | Status: DC
  Administered 2012-05-11 (×2): 3 mL via INTRAVENOUS

## 2012-05-11 MED ORDER — HEPARIN SODIUM (PORCINE) 5000 UNIT/ML IJ SOLN
5000.0000 [IU] | Freq: Three times a day (TID) | INTRAMUSCULAR | Status: DC
  Administered 2012-05-11 – 2012-05-12 (×3): 5000 [IU] via SUBCUTANEOUS
  Filled 2012-05-11 (×6): qty 1

## 2012-05-11 MED ORDER — ALUM & MAG HYDROXIDE-SIMETH 200-200-20 MG/5ML PO SUSP: 30.0000 mL | Freq: Four times a day (QID) | ORAL | Status: DC | PRN

## 2012-05-11 MED ORDER — LISINOPRIL-HYDROCHLOROTHIAZIDE 20-25 MG PO TABS: 1.0000 | ORAL_TABLET | Freq: Every day | ORAL | Status: DC

## 2012-05-11 NOTE — Progress Notes (Signed)
Pt complaining of 8/10 chest pain and pressure starting on her right side, continuing to her left chest. Pt claimed it was radiating to her right shoulder. One sublingual NTG given at 2310, with BP of 184/76. EKG obtained, pt in sinus brady. MD notified. Pt still having 2/10 chest pressure at 2315, second sublingual NTG given at this time. BP 148/72. Pt chest pressure 0/10. MD ordered 5mg  IV hydralazine Q4H PRN for SBP > 170. Pt now resting comfortably, watching television. Will continue to monitor.  Harless Litten, RN 05/11/12
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
## 2012-05-11 NOTE — ED Notes (Signed)
Pt is here with left neck pain that went up to head and then started having chest pain.  Pt did take some asa at home

## 2012-05-11 NOTE — ED Notes (Signed)
LUNCH ORDERED 

## 2012-05-11 NOTE — ED Provider Notes (Signed)
Medical screening examination/treatment/procedure(s) were performed by non-physician practitioner and as supervising physician I was immediately available for consultation/collaboration.  Jones Skene, M.D.     Jones Skene, MD 05/11/12 2158

## 2012-05-11 NOTE — ED Notes (Signed)
Patient transported to [facility]U6 via wheelchair with all personal belongings. Report given to [place], [place]
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
## 2012-05-11 NOTE — ED Notes (Signed)
CALLED [facility] IN [facility] TO CHECK ON DELAY IN GETTING ISTAT TROPONIN DONE. HE WILL CHECK WITH PHLEBOTOMY

## 2012-05-12 LAB — TROPONIN I: Troponin I: <0.3 ng/mL (ref <0.30)

## 2012-05-12 LAB — CBC
MCV: 81.6 fL (ref 78.0–100.0)
Platelets: 320 10*3/uL (ref 150–400)
RDW: 17.4 % — ABNORMAL HIGH (ref 11.5–15.5)
WBC: 7.1 10*3/uL (ref 4.0–10.5)

## 2012-05-12 LAB — LIPID PANEL
HDL: 37 mg/dL — ABNORMAL LOW (ref >39)
Total CHOL/HDL Ratio: 4.5 RATIO
VLDL: 39 mg/dL (ref 0–40)

## 2012-05-12 MED ORDER — CYCLOBENZAPRINE HCL 5 MG PO TABS: 5.0000 mg | ORAL_TABLET | Freq: Three times a day (TID) | ORAL | Status: DC | PRN

## 2012-05-12 MED ORDER — PANTOPRAZOLE SODIUM 40 MG PO TBEC
40.0000 mg | DELAYED_RELEASE_TABLET | Freq: Every day | ORAL | Status: DC
Start: 2012-05-12 — End: 2012-05-12
  Administered 2012-05-12: 40 mg via ORAL
  Filled 2012-05-12: qty 1

## 2012-05-12 MED ORDER — PANTOPRAZOLE SODIUM 40 MG PO TBEC: 40.0000 mg | DELAYED_RELEASE_TABLET | Freq: Every day | ORAL | Status: DC
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
## 2012-05-12 NOTE — Progress Notes (Signed)
  Echocardiogram 2D Echocardiogram has been performed.  [name] 05/12/2012, 3:58 PM

## 2012-10-02 ENCOUNTER — Other Ambulatory Visit: Payer: Self-pay

## 2012-10-02 ENCOUNTER — Emergency Department (HOSPITAL_COMMUNITY)
Admission: EM | Admit: 2012-10-02 | Discharge: 2012-10-02 | Disposition: A | Discharge status: Home / Self Care | Payer: Medicare Other | Attending: Emergency Medicine | Admitting: Emergency Medicine

## 2012-10-02 ENCOUNTER — Encounter (HOSPITAL_COMMUNITY): Payer: Self-pay

## 2012-10-02 DIAGNOSIS — Z7982 Long term (current) use of aspirin: Secondary | ICD-10-CM | POA: Insufficient documentation

## 2012-10-02 DIAGNOSIS — K219 Gastro-esophageal reflux disease without esophagitis: Secondary | ICD-10-CM | POA: Insufficient documentation

## 2012-10-02 DIAGNOSIS — I10 Essential (primary) hypertension: Secondary | ICD-10-CM | POA: Insufficient documentation

## 2012-10-02 DIAGNOSIS — G8929 Other chronic pain: Secondary | ICD-10-CM | POA: Insufficient documentation

## 2012-10-02 DIAGNOSIS — R0789 Other chest pain: Secondary | ICD-10-CM | POA: Insufficient documentation

## 2012-10-02 DIAGNOSIS — M129 Arthropathy, unspecified: Secondary | ICD-10-CM | POA: Insufficient documentation

## 2012-10-02 DIAGNOSIS — E785 Hyperlipidemia, unspecified: Secondary | ICD-10-CM | POA: Insufficient documentation

## 2012-10-02 DIAGNOSIS — F411 Generalized anxiety disorder: Secondary | ICD-10-CM | POA: Insufficient documentation

## 2012-10-02 DIAGNOSIS — Z79899 Other long term (current) drug therapy: Secondary | ICD-10-CM | POA: Insufficient documentation

## 2012-10-02 DIAGNOSIS — F17200 Nicotine dependence, unspecified, uncomplicated: Secondary | ICD-10-CM | POA: Insufficient documentation

## 2012-10-02 DIAGNOSIS — R079 Chest pain, unspecified: Secondary | ICD-10-CM

## 2012-10-02 LAB — POCT I-STAT TROPONIN I: Troponin i, poc: 0.01 ng/mL (ref 0.00–0.08)

## 2012-10-02 LAB — BASIC METABOLIC PANEL
Calcium: 9.8 mg/dL (ref 8.4–10.5)
Chloride: 99 mEq/L (ref 96–112)
Creatinine, Ser: 0.72 mg/dL (ref 0.50–1.10)
GFR calc Af Amer: >90 mL/min (ref >90)
GFR calc non Af Amer: >90 mL/min (ref >90)

## 2012-10-02 LAB — CBC
MCHC: 34.8 g/dL (ref 30.0–36.0)
MCV: 82.7 fL (ref 78.0–100.0)
Platelets: 409 10*3/uL — ABNORMAL HIGH (ref 150–400)
RDW: 14.6 % (ref 11.5–15.5)
WBC: 9.8 10*3/uL (ref 4.0–10.5)

## 2012-10-02 NOTE — ED Notes (Signed)
Pt here stating needing to set up stress test, did not know where to go because her PCP said they can't do it. Patient denying any cp or discomfort. A x 4

## 2012-10-02 NOTE — ED Provider Notes (Signed)
History     CSN: [id number]  Arrival date & time 10/02/12  1417   None     Chief Complaint  Patient presents with  . stress test    . Chest Pain    (Consider location/radiation/quality/duration/timing/severity/associated sxs/prior treatment) HPI Comments: 85 y F with PMH of HTN, HLD, chronic pain, anxiety and GERD here b/c she is need of a pre-colonoscopy stress test and has presented here to obtain that testing.  She has no complaints, but does report intermittent chest pain x 1 year on questioning.  No CP currently.  No concerning features.  Patient is a 62 y.o. female presenting with chest pain. The history is provided by the patient.  Chest Pain Pain location:  L chest Pain quality: dull   Pain radiates to:  L arm Pain radiates to the back: no   Pain severity:  Mild Onset quality:  Unable to specify Duration: one year, lasts several minutes at a time. Timing:  Intermittent Progression:  Unchanged Chronicity:  Chronic Context: at rest   Ineffective treatments:  None tried Associated symptoms: no cough, no fever, no palpitations and no shortness of breath     Past Medical History  Diagnosis Date  . Hypertension   . Hyperlipemia   . Chronic pain   . Arthritis   . Anxiety   . Depression   . GERD (gastroesophageal reflux disease)     Past Surgical History  Procedure Laterality Date  . Cesarean section    . Uterine fibroid surgery    . Hand surgery      History reviewed. No pertinent family history.  History  Substance Use Topics  . Smoking status: Current Every Day Smoker -- 0.50 packs/day for 41 years    Types: Cigarettes  . Smokeless tobacco: Never Used  . Alcohol Use: Yes     Comment: rare    OB History   Grav Para Term Preterm Abortions TAB SAB Ect Mult Living                  Review of Systems  Constitutional: Negative for fever and chills.  Respiratory: Negative for cough, chest tightness and shortness of breath.   Cardiovascular: Negative  for chest pain, palpitations and leg swelling.  All other systems reviewed and are negative.    Allergies  Review of patient's allergies indicates no known allergies.  Home Medications   Current Outpatient Rx  Name  Route  Sig  Dispense  Refill  . albuterol (PROVENTIL HFA;VENTOLIN HFA) 108 (90 BASE) MCG/ACT inhaler   Inhalation   Inhale 2 puffs into the lungs every 6 (six) hours as needed. For shortness of breath         . aspirin EC 81 MG tablet   Oral   Take 81 mg by mouth daily as needed. For chest pain         . cholecalciferol (VITAMIN D) 1000 UNITS tablet   Oral   Take 1,000 Units by mouth daily.         Marland Kitchen lisinopril-hydrochlorothiazide (PRINZIDE,ZESTORETIC) 20-25 MG per tablet   Oral   Take 1 tablet by mouth daily.         . pantoprazole (PROTONIX) 40 MG tablet   Oral   Take 1 tablet (40 mg total) by mouth daily.   30 tablet   0     BP 122/66  Pulse 75  Temp(Src) 98.3 F (36.8 C) (Oral)  Resp 14  SpO2 99%  Physical Exam  Constitutional: She is oriented to person, place, and time. She appears well-developed and well-nourished.  HENT:  Right Ear: External ear normal.  Left Ear: External ear normal.  Mouth/Throat: No oropharyngeal exudate.  Eyes: Conjunctivae and EOM are normal. Pupils are equal, round, and reactive to light.  Neck: Normal range of motion. Neck supple.  Cardiovascular: Normal rate, regular rhythm, normal heart sounds and intact distal pulses.  Exam reveals no gallop and no friction rub.   No murmur heard. Pulmonary/Chest: Effort normal and breath sounds normal.  Abdominal: Soft. Bowel sounds are normal. She exhibits no distension. There is no tenderness.  Musculoskeletal: Normal range of motion. She exhibits no edema.  Neurological: She is alert and oriented to person, place, and time. No cranial nerve deficit.  Skin: Skin is warm and dry. No rash noted.  Psychiatric: She has a normal mood and affect.    ED Course  Procedures  (including critical care time)  Labs Reviewed  CBC - Abnormal; Notable for the following:    Platelets 409 (*)    All other components within normal limits  BASIC METABOLIC PANEL - Abnormal; Notable for the following:    Sodium 134 (*)    All other components within normal limits  POCT I-STAT TROPONIN I   No results found.   1. Chest pain     EKG: NSR rate 71, PR 158, QRS 82, QTc 417; no STE/STD, no significant change from priors  MDM   53 y F with PMH of HTN, HLD, chronic pain, anxiety and GERD here b/c she is need of a pre-colonoscopy stress test and has presented here to obtain that testing.  She has no complaints, but does report intermittent chest pain x 1 year on questioning.  No CP currently.  No concerning features.  Well appearing on exam.  Lungs clear.  Cardiac exam benign.  No JVD/edema.  Triage trop 0.01.  EKG unchanged from prior.  PCP's office contacted, but unable to make contact with anyone regarding the situation.  Pt was given the number to contact Cardiology to arrange for this outpatient procedure.   Return precautions reviewed.  It is felt the pt is stable for d/c with PCP f/u.  Pt seen in conjunction with my attending, Dr. Deretha Emory.  Oleh Genin, MD PGY-II [facility] Emergency Medicine Resident      Oleh Genin, MD 10/02/12 [phone]

## 2012-10-02 NOTE — ED Provider Notes (Addendum)
I saw and evaluated the patient, reviewed the resident's note and I agree with the findings and plan.  The patient seen by me. Patient was scheduled for a colonoscopy during her preparation got a call saying that they determined that she was going to need a work up by cardiology for past history of chest pain. Patient has been having some intermittent chest pain over the last few days perhaps even longer but never lasting more than 5 minutes. No chest pain currently. Patient has a primary care Dr. will refer patient back to her primary care doctor and also did hear a cardiology referral to [facility] cardiology. On physical exam patient's heart is regular rate and rhythm without murmurs lungs are clear bilaterally. Abdomen is soft and nontender with normal bowel sounds. As stated above currently no chest pain.  Results for orders placed during the hospital encounter of 10/02/12  CBC      Result Value Range   WBC 9.8  4.0 - 10.5 K/uL   RBC 4.69  3.87 - 5.11 MIL/uL   Hemoglobin 13.5  12.0 - 15.0 g/dL   HCT 16.1  09.6 - 04.5 %   MCV 82.7  78.0 - 100.0 fL   MCH 28.8  26.0 - 34.0 pg   MCHC 34.8  30.0 - 36.0 g/dL   RDW 40.9  81.1 - 91.4 %   Platelets 409 (*) 150 - 400 K/uL  BASIC METABOLIC PANEL      Result Value Range   Sodium 134 (*) 135 - 145 mEq/L   Potassium 3.7  3.5 - 5.1 mEq/L   Chloride 99  96 - 112 mEq/L   CO2 26  19 - 32 mEq/L   Glucose, Bld 93  70 - 99 mg/dL   BUN 17  6 - 23 mg/dL   Creatinine, Ser 7.82  0.50 - 1.10 mg/dL   Calcium 9.8  8.4 - 95.6 mg/dL   GFR calc non Af Amer >90  >90 mL/min   GFR calc Af Amer >90  >90 mL/min  POCT I-STAT TROPONIN I      Result Value Range   Troponin i, poc 0.01  0.00 - 0.08 ng/mL   Comment 3               Shelda Jakes, MD 10/02/12 1533  Shelda Jakes, MD 10/02/12 1535

## 2012-10-02 NOTE — ED Notes (Signed)
Pt denies active chest pain at this moment but did experience chest pain last night

## 2012-10-02 NOTE — ED Notes (Addendum)
Pt was scheduled to have a colonoscopy yesterday, they would not perform the colonoscopy because pt had not had a stress test. Pt states her pcp does not do stress test, pt came here b/c she did not know where else to go. Pt also c/o intermittent mid-sternum chest pain radiating down into her Left arm x1 year. Pt denies N/V, SOB, weakness, or diaphoresis.

## 2012-10-03 NOTE — ED Provider Notes (Signed)
I saw and evaluated the patient, reviewed the resident's note and I agree with the findings and plan.   Shelda Jakes, MD 10/03/12 2005

## 2012-12-03 ENCOUNTER — Other Ambulatory Visit: Payer: Self-pay | Admitting: Family Medicine

## 2012-12-03 DIAGNOSIS — Z1231 Encounter for screening mammogram for malignant neoplasm of breast: Secondary | ICD-10-CM

## 2012-12-09 ENCOUNTER — Ambulatory Visit (HOSPITAL_COMMUNITY)
Admission: RE | Admit: 2012-12-09 | Discharge: 2012-12-09 | Disposition: A | Discharge status: Home / Self Care | Payer: PRIVATE HEALTH INSURANCE | Source: Ambulatory Visit | Attending: Family Medicine | Admitting: Family Medicine

## 2012-12-09 DIAGNOSIS — Z1231 Encounter for screening mammogram for malignant neoplasm of breast: Secondary | ICD-10-CM

## 2012-12-15 ENCOUNTER — Encounter (HOSPITAL_COMMUNITY): Payer: Self-pay | Admitting: Pharmacy Technician

## 2012-12-22 ENCOUNTER — Encounter: Payer: Self-pay | Admitting: Dietician

## 2012-12-22 ENCOUNTER — Encounter: Payer: PRIVATE HEALTH INSURANCE | Attending: Internal Medicine | Admitting: Dietician

## 2012-12-22 VITALS — Ht 59.5 in | Wt 205.6 lb

## 2012-12-22 DIAGNOSIS — F17200 Nicotine dependence, unspecified, uncomplicated: Secondary | ICD-10-CM | POA: Insufficient documentation

## 2012-12-22 DIAGNOSIS — Z713 Dietary counseling and surveillance: Secondary | ICD-10-CM | POA: Insufficient documentation

## 2012-12-22 DIAGNOSIS — E119 Type 2 diabetes mellitus without complications: Secondary | ICD-10-CM | POA: Insufficient documentation
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
## 2012-12-27 ENCOUNTER — Emergency Department (HOSPITAL_COMMUNITY)
Admission: EM | Admit: 2012-12-27 | Discharge: 2012-12-27 | Disposition: A | Discharge status: Home / Self Care | Payer: PRIVATE HEALTH INSURANCE | Attending: Emergency Medicine | Admitting: Emergency Medicine

## 2012-12-27 ENCOUNTER — Encounter (HOSPITAL_COMMUNITY): Payer: Self-pay | Admitting: Emergency Medicine

## 2012-12-27 DIAGNOSIS — Z8739 Personal history of other diseases of the musculoskeletal system and connective tissue: Secondary | ICD-10-CM | POA: Insufficient documentation

## 2012-12-27 DIAGNOSIS — F3289 Other specified depressive episodes: Secondary | ICD-10-CM | POA: Insufficient documentation

## 2012-12-27 DIAGNOSIS — F411 Generalized anxiety disorder: Secondary | ICD-10-CM | POA: Insufficient documentation

## 2012-12-27 DIAGNOSIS — G8929 Other chronic pain: Secondary | ICD-10-CM | POA: Insufficient documentation

## 2012-12-27 DIAGNOSIS — Z7902 Long term (current) use of antithrombotics/antiplatelets: Secondary | ICD-10-CM | POA: Insufficient documentation

## 2012-12-27 DIAGNOSIS — E119 Type 2 diabetes mellitus without complications: Secondary | ICD-10-CM | POA: Insufficient documentation

## 2012-12-27 DIAGNOSIS — K219 Gastro-esophageal reflux disease without esophagitis: Secondary | ICD-10-CM | POA: Insufficient documentation

## 2012-12-27 DIAGNOSIS — F329 Major depressive disorder, single episode, unspecified: Secondary | ICD-10-CM | POA: Insufficient documentation

## 2012-12-27 DIAGNOSIS — L299 Pruritus, unspecified: Secondary | ICD-10-CM | POA: Insufficient documentation

## 2012-12-27 DIAGNOSIS — Z79899 Other long term (current) drug therapy: Secondary | ICD-10-CM | POA: Insufficient documentation

## 2012-12-27 DIAGNOSIS — I10 Essential (primary) hypertension: Secondary | ICD-10-CM | POA: Insufficient documentation

## 2012-12-27 DIAGNOSIS — F17200 Nicotine dependence, unspecified, uncomplicated: Secondary | ICD-10-CM | POA: Insufficient documentation

## 2012-12-27 DIAGNOSIS — E785 Hyperlipidemia, unspecified: Secondary | ICD-10-CM | POA: Insufficient documentation

## 2012-12-27 HISTORY — DX: Type 2 diabetes mellitus without complications: E11.9

## 2012-12-27 MED ORDER — HYDROXYZINE HCL 25 MG PO TABS: 25.0000 mg | ORAL_TABLET | Freq: Three times a day (TID) | ORAL | Status: DC | PRN

## 2012-12-27 MED ORDER — DIPHENHYDRAMINE HCL 2 % EX CREA: TOPICAL_CREAM | Freq: Three times a day (TID) | CUTANEOUS | Status: DC | PRN

## 2012-12-27 NOTE — ED Provider Notes (Signed)
History    CSN: [id number] Arrival date & time 12/27/12  1118  First MD Initiated Contact with Patient 12/27/12 1147     Chief Complaint  Patient presents with  . Pruritis   (Consider location/radiation/quality/duration/timing/severity/associated sxs/prior Treatment) The history is provided by the patient.   Patient presents to the ED for generalized itching beginning last night. Patient states it initially started on her hands and feet but has now progressed and her entire body is itching.  No recent changes in soaps or detergents. No rashes or bug bites. No contact with irritating chemicals or cleaners. Patient notes she started a new diabetic medication (metformin) approx 3 weeks ago.  Pt has used vaseline topical lotion to help with dry skin-- has used before without rxn.  No fevers, sweats, or chills.  No family members at home with similar sx.  Past Medical History  Diagnosis Date  . Hypertension   . Hyperlipemia   . Chronic pain   . Arthritis   . Anxiety   . Depression   . GERD (gastroesophageal reflux disease)   . Diabetes mellitus without complication    Past Surgical History  Procedure Laterality Date  . Cesarean section    . Uterine fibroid surgery    . Hand surgery     History reviewed. No pertinent family history. History  Substance Use Topics  . Smoking status: Current Every Day Smoker -- 0.50 packs/day for 41 years    Types: Cigarettes  . Smokeless tobacco: Never Used  . Alcohol Use: Yes     Comment: rare   OB History   Grav Para Term Preterm Abortions TAB SAB Ect Mult Living                 Review of Systems  Skin:       pruritus  All other systems reviewed and are negative.    Allergies  Review of patient's allergies indicates no known allergies.  Home Medications   Current Outpatient Rx  Name  Route  Sig  Dispense  Refill  . albuterol (PROVENTIL HFA;VENTOLIN HFA) 108 (90 BASE) MCG/ACT inhaler   Inhalation   Inhale 2 puffs into the  lungs every 6 (six) hours as needed. For shortness of breath         . aspirin EC 81 MG tablet   Oral   Take 81 mg by mouth daily as needed. For chest pain         . atorvastatin (LIPITOR) 40 MG tablet   Oral   Take 40 mg by mouth every morning.         . cholecalciferol (VITAMIN D) 1000 UNITS tablet   Oral   Take 1,000 Units by mouth daily.         . cilostazol (PLETAL) 50 MG tablet   Oral   Take 50 mg by mouth 2 (two) times daily.         . clopidogrel (PLAVIX) 75 MG tablet   Oral   Take 75 mg by mouth daily.         . DULoxetine (CYMBALTA) 30 MG capsule   Oral   Take 30 mg by mouth daily.         Marland Kitchen lisinopril-hydrochlorothiazide (PRINZIDE,ZESTORETIC) 20-25 MG per tablet   Oral   Take 1 tablet by mouth daily.         . metFORMIN (GLUCOPHAGE) 500 MG tablet   Oral   Take 250 mg by mouth 2 (two) times daily  with a meal.          BP 138/76  Pulse 100  Temp(Src) 98.6 F (37 C) (Oral)  Resp 22  SpO2 98%  Physical Exam  Nursing note and vitals reviewed. Constitutional: She is oriented to person, place, and time. She appears well-developed and well-nourished.  HENT:  Head: Normocephalic and atraumatic.  Mouth/Throat: Uvula is midline, oropharynx is clear and moist and mucous membranes are normal. No edematous. No posterior oropharyngeal edema or posterior oropharyngeal erythema.  Eyes: Conjunctivae and EOM are normal. Pupils are equal, round, and reactive to light.  Neck: Normal range of motion. Neck supple.  Cardiovascular: Normal rate, regular rhythm and normal heart sounds.   Pulmonary/Chest: Effort normal and breath sounds normal.  Musculoskeletal: Normal range of motion. She exhibits no edema.  Neurological: She is alert and oriented to person, place, and time.  Skin: Skin is warm and dry. No lesion and no rash noted.  No visible rashes or bites, dry cracked skin on soles of feet with some excoriation present  Psychiatric: She has a normal mood  and affect.    ED Course  Procedures (including critical care time) Labs Reviewed - No data to display No results found.  1. Pruritus     MDM   Generalized pruritis without identifiable cause.  Rx atarax and benadryl cream.  FU with PCP if sx not improving.  Discussed plan with pt, she agreed.  Return precautions advised.  Garlon Hatchet, PA-C 12/27/12 1545

## 2012-12-27 NOTE — ED Notes (Signed)
Pt c/o generalized itching starting last night with hand and feet and generalized this am; pt denies change in detergent and sts started DM meds x 3 weeks ago

## 2012-12-28 NOTE — ED Provider Notes (Signed)
Medical screening examination/treatment/procedure(s) were performed by non-physician practitioner and as supervising physician I was immediately available for consultation/collaboration.   Keshara Kiger, MD 12/28/12 1635 

## 2012-12-29 ENCOUNTER — Encounter (HOSPITAL_COMMUNITY)
Admission: RE | Disposition: A | Discharge status: Home / Self Care | Payer: Self-pay | Source: Ambulatory Visit | Attending: Cardiology

## 2012-12-29 ENCOUNTER — Ambulatory Visit (HOSPITAL_COMMUNITY)
Admission: RE | Admit: 2012-12-29 | Discharge: 2012-12-29 | Disposition: A | Discharge status: Home / Self Care | Payer: Medicare Other | Source: Ambulatory Visit | Attending: Cardiology | Admitting: Cardiology

## 2012-12-29 DIAGNOSIS — Z8249 Family history of ischemic heart disease and other diseases of the circulatory system: Secondary | ICD-10-CM | POA: Insufficient documentation

## 2012-12-29 DIAGNOSIS — Z79899 Other long term (current) drug therapy: Secondary | ICD-10-CM | POA: Insufficient documentation

## 2012-12-29 DIAGNOSIS — Z7982 Long term (current) use of aspirin: Secondary | ICD-10-CM | POA: Insufficient documentation

## 2012-12-29 DIAGNOSIS — I70219 Atherosclerosis of native arteries of extremities with intermittent claudication, unspecified extremity: Secondary | ICD-10-CM | POA: Insufficient documentation

## 2012-12-29 DIAGNOSIS — R0989 Other specified symptoms and signs involving the circulatory and respiratory systems: Secondary | ICD-10-CM | POA: Insufficient documentation

## 2012-12-29 DIAGNOSIS — F17200 Nicotine dependence, unspecified, uncomplicated: Secondary | ICD-10-CM | POA: Insufficient documentation

## 2012-12-29 DIAGNOSIS — I10 Essential (primary) hypertension: Secondary | ICD-10-CM | POA: Insufficient documentation

## 2012-12-29 DIAGNOSIS — E785 Hyperlipidemia, unspecified: Secondary | ICD-10-CM | POA: Insufficient documentation

## 2012-12-29 DIAGNOSIS — E119 Type 2 diabetes mellitus without complications: Secondary | ICD-10-CM | POA: Insufficient documentation

## 2012-12-29 DIAGNOSIS — E782 Mixed hyperlipidemia: Secondary | ICD-10-CM | POA: Insufficient documentation

## 2012-12-29 DIAGNOSIS — K5730 Diverticulosis of large intestine without perforation or abscess without bleeding: Secondary | ICD-10-CM | POA: Insufficient documentation

## 2012-12-29 DIAGNOSIS — D649 Anemia, unspecified: Secondary | ICD-10-CM | POA: Insufficient documentation

## 2012-12-29 HISTORY — PX: OTHER SURGICAL HISTORY: SHX169

## 2012-12-29 HISTORY — PX: LOWER EXTREMITY ANGIOGRAM: SHX5508

## 2012-12-29 LAB — BASIC METABOLIC PANEL
Calcium: 9.2 mg/dL (ref 8.4–10.5)
Creatinine, Ser: 0.57 mg/dL (ref 0.50–1.10)
GFR calc Af Amer: >90 mL/min (ref >90)
GFR calc non Af Amer: >90 mL/min (ref >90)
Sodium: 134 mEq/L — ABNORMAL LOW (ref 135–145)

## 2012-12-29 LAB — CBC
MCH: 27.5 pg (ref 26.0–34.0)
MCHC: 33.2 g/dL (ref 30.0–36.0)
MCV: 82.9 fL (ref 78.0–100.0)
Platelets: 538 10*3/uL — ABNORMAL HIGH (ref 150–400)
RBC: 3.16 MIL/uL — ABNORMAL LOW (ref 3.87–5.11)
RDW: 14.9 % (ref 11.5–15.5)

## 2012-12-29 LAB — PROTIME-INR
INR: 1.01 (ref 0.00–1.49)
Prothrombin Time: 13.1 seconds (ref 11.6–15.2)

## 2012-12-29 LAB — GLUCOSE, CAPILLARY: Glucose-Capillary: 109 mg/dL — ABNORMAL HIGH (ref 70–99)

## 2012-12-29 SURGERY — ANGIOGRAM, LOWER EXTREMITY
Anesthesia: LOCAL

## 2012-12-29 MED ORDER — POTASSIUM CHLORIDE CRYS ER 20 MEQ PO TBCR
EXTENDED_RELEASE_TABLET | ORAL | Status: AC
  Filled 2012-12-29: qty 1

## 2012-12-29 MED ORDER — LIDOCAINE HCL (PF) 1 % IJ SOLN
INTRAMUSCULAR | Status: AC
  Filled 2012-12-29: qty 30

## 2012-12-29 MED ORDER — METFORMIN HCL 500 MG PO TABS: 250.0000 mg | ORAL_TABLET | Freq: Two times a day (BID) | ORAL | Status: DC

## 2012-12-29 MED ORDER — ONDANSETRON HCL 4 MG/2ML IJ SOLN: 4.0000 mg | Freq: Four times a day (QID) | INTRAMUSCULAR | Status: DC | PRN

## 2012-12-29 MED ORDER — ACETAMINOPHEN 325 MG PO TABS: 650.0000 mg | ORAL_TABLET | ORAL | Status: DC | PRN

## 2012-12-29 MED ORDER — SODIUM CHLORIDE 0.9 % IV SOLN
INTRAVENOUS | Status: DC
  Administered 2012-12-29: 08:00:00 via INTRAVENOUS

## 2012-12-29 MED ORDER — HEPARIN (PORCINE) IN NACL 2-0.9 UNIT/ML-% IJ SOLN
INTRAMUSCULAR | Status: AC
  Filled 2012-12-29: qty 1000

## 2012-12-29 MED ORDER — POTASSIUM CHLORIDE CRYS ER 20 MEQ PO TBCR: 20.0000 meq | EXTENDED_RELEASE_TABLET | Freq: Once | ORAL | Status: DC

## 2012-12-29 NOTE — Progress Notes (Signed)
Inpatient Diabetes Program Recommendations  AACE/ADA: New Consensus Statement on Inpatient Glycemic Control (2013)  Target Ranges:  Prepandial:   less than 140 mg/dL      Peak postprandial:   less than 180 mg/dL (1-2 hours)      Critically ill patients:  140 - 180 mg/dL     Called by Victorino Dike, RN on cardiac cath unit about this patient.  Patient was just recently diagnosed with DM and has been taking Metformin for ~3 weeks.  PCP is Dr. Julio Sicks in [facility].  Gave patient basic information on controlling her DM at home.  Discussed checking her CBGs at home and how to take medication.  Patient has recently seen a CDE at the [facility] for Outpatient DM education.  Patient had no questions for me at this time.  To be discharged home after procedure.  Ambrose Finland RN, MSN, CDE Diabetes Coordinator Inpatient Diabetes Program [phone]

## 2012-12-29 NOTE — Interval H&P Note (Signed)
History and Physical Interval Note:  12/29/2012 9:25 AM  [name]  has presented today for surgery, with the diagnosis of Claudication  The various methods of treatment have been discussed with the patient and family. After consideration of risks, benefits and other options for treatment, the patient has consented to  Procedure(s): LOWER EXTREMITY ANGIOGRAM (N/A) as a surgical intervention .  The patient's history has been reviewed, patient examined, no change in status, stable for surgery.  I have reviewed the patient's chart and labs.  Questions were answered to the patient's satisfaction.     Pamella Pert

## 2012-12-29 NOTE — Progress Notes (Signed)
Notified Candis Schatz of hgb 8.7, was 13.5 on 10-02-12, he states ok will do diagnostic case only

## 2012-12-29 NOTE — H&P (Addendum)
  See the written copy of this report in the patient's paper medical record.  These results did not interface directly into the electronic medical record and are summarized here. PAD with claudication. New anemia noted on pre procedure labs will need evaluation. Will proceed with diagnostic angiogram.

## 2012-12-29 NOTE — Op Note (Signed)
Procedures performed: Left Femoral access 1. Abdominal aortogram. Abdominal aortogram and crossover from left into theright femoral artery placement of catheter tip in the left femoral artery and left femoral arteriogram with distal runoff 2. Left femoral arteriogram with distal runoff 3. Left femoral arteriogram and closure of the right femoral arterial access with Minx.  Patient is a 62 year old female with history of hypertension, hyperlipidemia and new onset diabetes mellitus, who presents severe lifestyle limiting claudication, right worse than the left, lower extremity arterial duplex in the mid occlusion of the SFA. She was brought to the peripheral angiography suite to evaluate her peripheral anatomy.  Peripheral arthrogram: No evidence of abdominal aneurysm. 2 renal arteries one on either side and they're widely patent. The infrarenal abdominal aorta shows moderate amount of atherosclerotic changes with diffuse luminal irregularity. He clearly had diabetes and is widely patent and right common iliac artery shows a 20-30% stenosis followed by poststenotic ectasia/small aneurysm formation. There was no knee and across the stenosis the internal and external iliac arteries are widely patent the left common iliac artery showed mild disease.  Right femoral arteriogram:  The right common femoral artery is widely patent, the profunda femoral artery has an ostial 30-40% stenosis. The right SFA is occluded with a very small origin indentation. The SFA reconstitutes just above the Hunter's canal, however reconstitution is very close to its occlusion. This is diffusely diseased and if percutaneous revascularization is performed, it has been stented from the origin of the SFA to the proximal segment of the reconstitution at the level of the abductor canal.  Left femoral arteriogram: The left femoral artery and the left SFA are widely patent with very minimal luminal plaquing. The popliteal artery shows a  40% stenosis. Below the left knee there is three-vessel runoff, however the posterior tibial artery is occluded at the level of the mid portion of the left leg with distal reconstitution. The peroneal artery is the major vessels. The anterior tibial artery on the left shows mild diffuse disease.   A total of 99 cc of contrast was utilized for diagnostic angiography.  Recommendation: Patient is alert probable GI bleed with dark stools, probably related to recent initiation of Plavix therapy, will need outpatient GI workup. I will probably treat her PAD conservatively, may consider angioplasty of the right SFA which appears to be amenable, however is complex. But before proceeding with this, a definitely evaluate her anemia. She needs aggressive risk factor modification, I have referred her for diabetes management. With regard to the left leg, she is small vessel disease, and will be treated conservatively.  TECHNIQUE OF PROCEDURE: Under sterile precautions using a 5-French LEFT femoral arterial access a 5 French pigtail catheter was advanced into the abdominal aorta and abdominal aortogram was performed. I then utilized a 5 [place] crossover catheter to cross into the right common femoral artery and right femoral arteriogram distal runoff was performed. The catheter was then pulled out of the body over J-wire. Left femoral arterial study was performed via arterial access sheath. The access was closed with minx with excellent hemostasis. There was no immediate complications.

## 2012-12-31 ENCOUNTER — Encounter (HOSPITAL_COMMUNITY): Payer: Self-pay | Admitting: Cardiology

## 2012-12-31 ENCOUNTER — Emergency Department (HOSPITAL_COMMUNITY)
Admission: EM | Admit: 2012-12-31 | Discharge: 2012-12-31 | Disposition: A | Discharge status: Home / Self Care | Payer: PRIVATE HEALTH INSURANCE | Attending: Emergency Medicine | Admitting: Emergency Medicine

## 2012-12-31 DIAGNOSIS — K219 Gastro-esophageal reflux disease without esophagitis: Secondary | ICD-10-CM | POA: Insufficient documentation

## 2012-12-31 DIAGNOSIS — F411 Generalized anxiety disorder: Secondary | ICD-10-CM | POA: Insufficient documentation

## 2012-12-31 DIAGNOSIS — E785 Hyperlipidemia, unspecified: Secondary | ICD-10-CM | POA: Insufficient documentation

## 2012-12-31 DIAGNOSIS — G8929 Other chronic pain: Secondary | ICD-10-CM | POA: Insufficient documentation

## 2012-12-31 DIAGNOSIS — R609 Edema, unspecified: Secondary | ICD-10-CM | POA: Insufficient documentation

## 2012-12-31 DIAGNOSIS — R209 Unspecified disturbances of skin sensation: Secondary | ICD-10-CM | POA: Insufficient documentation

## 2012-12-31 DIAGNOSIS — Z79899 Other long term (current) drug therapy: Secondary | ICD-10-CM | POA: Insufficient documentation

## 2012-12-31 DIAGNOSIS — F3289 Other specified depressive episodes: Secondary | ICD-10-CM | POA: Insufficient documentation

## 2012-12-31 DIAGNOSIS — E119 Type 2 diabetes mellitus without complications: Secondary | ICD-10-CM | POA: Insufficient documentation

## 2012-12-31 DIAGNOSIS — F17200 Nicotine dependence, unspecified, uncomplicated: Secondary | ICD-10-CM | POA: Insufficient documentation

## 2012-12-31 DIAGNOSIS — M25539 Pain in unspecified wrist: Secondary | ICD-10-CM | POA: Insufficient documentation

## 2012-12-31 DIAGNOSIS — I10 Essential (primary) hypertension: Secondary | ICD-10-CM | POA: Insufficient documentation

## 2012-12-31 DIAGNOSIS — M129 Arthropathy, unspecified: Secondary | ICD-10-CM | POA: Insufficient documentation

## 2012-12-31 DIAGNOSIS — F329 Major depressive disorder, single episode, unspecified: Secondary | ICD-10-CM | POA: Insufficient documentation

## 2012-12-31 DIAGNOSIS — Z7982 Long term (current) use of aspirin: Secondary | ICD-10-CM | POA: Insufficient documentation

## 2012-12-31 LAB — CBC WITH DIFFERENTIAL/PLATELET
Basophils Absolute: 0 10*3/uL (ref 0.0–0.1)
HCT: 21.6 % — ABNORMAL LOW (ref 36.0–46.0)
Lymphocytes Relative: 22 % (ref 12–46)
Monocytes Absolute: 0.8 10*3/uL (ref 0.1–1.0)
Neutro Abs: 6.8 10*3/uL (ref 1.7–7.7)
Neutrophils Relative %: 68 % (ref 43–77)
Platelets: 500 10*3/uL — ABNORMAL HIGH (ref 150–400)
RDW: 15.1 % (ref 11.5–15.5)
WBC: 10 10*3/uL (ref 4.0–10.5)

## 2012-12-31 LAB — BASIC METABOLIC PANEL
CO2: 29 mEq/L (ref 19–32)
Chloride: 99 mEq/L (ref 96–112)
GFR calc Af Amer: >90 mL/min (ref >90)
Potassium: 3.3 mEq/L — ABNORMAL LOW (ref 3.5–5.1)
Sodium: 137 mEq/L (ref 135–145)

## 2012-12-31 MED ORDER — OXYCODONE-ACETAMINOPHEN 5-325 MG PO TABS: 1.0000 | ORAL_TABLET | ORAL | Status: DC | PRN

## 2012-12-31 MED ORDER — INDOMETHACIN 25 MG PO CAPS
50.0000 mg | ORAL_CAPSULE | Freq: Once | ORAL | Status: AC
  Administered 2012-12-31: 50 mg via ORAL
  Filled 2012-12-31: qty 2

## 2012-12-31 MED ORDER — OXYCODONE HCL 5 MG PO TABS
5.0000 mg | ORAL_TABLET | Freq: Once | ORAL | Status: AC
  Administered 2012-12-31: 5 mg via ORAL
  Filled 2012-12-31: qty 1

## 2012-12-31 MED ORDER — GI COCKTAIL ~~LOC~~
30.0000 mL | Freq: Once | ORAL | Status: AC
  Administered 2012-12-31: 30 mL via ORAL
  Filled 2012-12-31: qty 30

## 2012-12-31 MED ORDER — COLCHICINE 0.6 MG PO TABS: 0.6000 mg | ORAL_TABLET | Freq: Two times a day (BID) | ORAL | Status: DC

## 2012-12-31 NOTE — ED Notes (Signed)
Pt reports that she has been having pain and swelling in her hands and feet since the 8th. States that hands and feet are painful to touch. Denies any chest pain or SOB. No distress noted.

## 2012-12-31 NOTE — ED Notes (Signed)
ED PA at bedside

## 2012-12-31 NOTE — ED Provider Notes (Signed)
History    CSN: [id number] Arrival date & time 12/31/12  1625  First MD Initiated Contact with Patient 12/31/12 1925     Chief Complaint  Patient presents with  . Leg Swelling   (Consider location/radiation/quality/duration/timing/severity/associated sxs/prior Treatment) The history is provided by the patient and medical records.   Pt presents to the ED for bilateral wrist pain and swelling x 2 days.  Pt states her wrists and hands are "burning", feel very hot, and even light touch is extremely painful.  No recent injury or trauma.  No rashes or bites.  No numbness or paresthesias of hands or fingers.  No dx of gout but pt has been eating a lot of fish lately.  Pt also recently started taking lisinopril-HCTZ about 2 months ago.  I personally evaluated pt in the ED for generalized bodily itching approx 4 days ago, swelling and burning was not present then.  Pt has hx of OA.    Pt also complaints of GERD increased over the past 2 days.  States "i feel like there is a lump stuck in my throat and my food will not go down."  Has taken some alka seltzer which caused a great deal of belching but the sensation in her throat is still present.  No chest pain, SOB, palpitations.  Pt notes she recently had a peripheral stent placed into her right SFA on 12/29/12.  Procedure was without complications.  No drainage from catheter entry site.  No fevers, sweats, or chills.  Past Medical History  Diagnosis Date  . Hypertension   . Hyperlipemia   . Chronic pain   . Arthritis   . Anxiety   . Depression   . GERD (gastroesophageal reflux disease)   . Diabetes mellitus without complication    Past Surgical History  Procedure Laterality Date  . Cesarean section    . Uterine fibroid surgery    . Hand surgery     History reviewed. No pertinent family history. History  Substance Use Topics  . Smoking status: Current Every Day Smoker -- 0.50 packs/day for 41 years    Types: Cigarettes  . Smokeless  tobacco: Never Used  . Alcohol Use: Yes     Comment: rare   OB History   Grav Para Term Preterm Abortions TAB SAB Ect Mult Living                 Review of Systems  Gastrointestinal:       Reflux  Musculoskeletal: Positive for joint swelling and arthralgias.  All other systems reviewed and are negative.    Allergies  Review of patient's allergies indicates no known allergies.  Home Medications   Current Outpatient Rx  Name  Route  Sig  Dispense  Refill  . albuterol (PROVENTIL HFA;VENTOLIN HFA) 108 (90 BASE) MCG/ACT inhaler   Inhalation   Inhale 2 puffs into the lungs every 6 (six) hours as needed. For shortness of breath         . aspirin EC 81 MG tablet   Oral   Take 81 mg by mouth daily as needed. For chest pain         . atorvastatin (LIPITOR) 40 MG tablet   Oral   Take 40 mg by mouth every morning.         . cholecalciferol (VITAMIN D) 1000 UNITS tablet   Oral   Take 1,000 Units by mouth daily.         . diphenhydrAMINE (BENADRYL MAXIMUM  STRENGTH) 2 % cream   Topical   Apply topically 3 (three) times daily as needed for itching.   30 g   0   . DULoxetine (CYMBALTA) 30 MG capsule   Oral   Take 30 mg by mouth daily.         . hydrOXYzine (ATARAX/VISTARIL) 25 MG tablet   Oral   Take 1 tablet (25 mg total) by mouth 3 (three) times daily as needed for itching.   30 tablet   0   . lisinopril-hydrochlorothiazide (PRINZIDE,ZESTORETIC) 20-25 MG per tablet   Oral   Take 1 tablet by mouth daily.         . metFORMIN (GLUCOPHAGE) 500 MG tablet   Oral   Take 0.5 tablets (250 mg total) by mouth 2 (two) times daily with a meal.           Start this medication after tomorrow.    BP 157/72  Pulse 90  Temp(Src) 98.5 F (36.9 C) (Oral)  Resp 24  SpO2 99%  Physical Exam  Nursing note and vitals reviewed. Constitutional: She is oriented to person, place, and time. She appears well-developed and well-nourished.  HENT:  Head: Normocephalic and  atraumatic.  Mouth/Throat: Uvula is midline, oropharynx is clear and moist and mucous membranes are normal. No edematous. No oropharyngeal exudate, posterior oropharyngeal edema, posterior oropharyngeal erythema or tonsillar abscesses.  No oropharyngeal edema, airway patent, speaking in full complete sentences without difficulty  Eyes: Conjunctivae and EOM are normal. Pupils are equal, round, and reactive to light.  Neck: Normal range of motion. Neck supple.  Cardiovascular: Normal rate, regular rhythm and normal heart sounds.   Pulmonary/Chest: Effort normal and breath sounds normal.  Musculoskeletal: Normal range of motion.       Legs: Both wrist diffusely TTP to light touch, R > L; increased warmth and erythema of skin; full ROM maintained of wrists and fingers, strong radial pulses, sensation intact; no lesions, abrasions, deformity or signs of trauma  Neurological: She is alert and oriented to person, place, and time.  Skin: Skin is warm and dry.  Psychiatric: She has a normal mood and affect.    ED Course  Procedures (including critical care time)  Labs Reviewed  CBC WITH DIFFERENTIAL - Abnormal; Notable for the following:    RBC 2.58 (*)    Hemoglobin 7.0 (*)    HCT 21.6 (*)    Platelets 500 (*)    All other components within normal limits  BASIC METABOLIC PANEL - Abnormal; Notable for the following:    Potassium 3.3 (*)    Glucose, Bld 121 (*)    All other components within normal limits   No results found.  1. Wrist pain, acute, unspecified laterality     MDM   Labs as above.  GERD sx improved with GI cocktail.  Drop in H/H from previous-- during arteriogram procedure some GI bleeding was identified (likely from initiation of plavix) and pt has already been referred to GI for OP management.  She is currently asx, no fatigue, chest pain, sob, palpitations, or abdominal pain.  Concern for possible gout given new initiation of HCTZ and signs/sx with wrists.  Will tx with  colchicine and oxycodone.  FU with PCP and GI.  Discussed plan with pt, she agreed.  Return precautions advised.  Discussed pt with Dr. Rubin Payor who agrees with plan.  Garlon Hatchet, PA-C 12/31/12 2334

## 2012-12-31 NOTE — ED Notes (Signed)
Pt alert and oriented, with steady gait at time of discharge. Pt given discharge papers and papers explained. All questions answered and pt walked to discharge.  

## 2013-01-02 NOTE — ED Provider Notes (Signed)
Medical screening examination/treatment/procedure(s) were performed by non-physician practitioner and as supervising physician I was immediately available for consultation/collaboration.  Rithwik Schmieg R. Shirlyn Savin, MD 01/02/13 1359 

## 2013-01-06 ENCOUNTER — Other Ambulatory Visit: Payer: Self-pay | Admitting: Gastroenterology

## 2013-01-08 ENCOUNTER — Encounter (HOSPITAL_COMMUNITY)
Admission: RE | Disposition: A | Discharge status: Home / Self Care | Payer: Self-pay | Source: Ambulatory Visit | Attending: Gastroenterology

## 2013-01-08 ENCOUNTER — Encounter (HOSPITAL_COMMUNITY): Payer: Self-pay | Admitting: *Deleted

## 2013-01-08 ENCOUNTER — Ambulatory Visit (HOSPITAL_COMMUNITY)
Admission: RE | Admit: 2013-01-08 | Discharge: 2013-01-08 | Disposition: A | Discharge status: Home / Self Care | Payer: PRIVATE HEALTH INSURANCE | Source: Ambulatory Visit | Attending: Gastroenterology | Admitting: Gastroenterology

## 2013-01-08 DIAGNOSIS — K620 Anal polyp: Secondary | ICD-10-CM | POA: Insufficient documentation

## 2013-01-08 DIAGNOSIS — K621 Rectal polyp: Secondary | ICD-10-CM | POA: Diagnosis not present

## 2013-01-08 DIAGNOSIS — K449 Diaphragmatic hernia without obstruction or gangrene: Secondary | ICD-10-CM | POA: Diagnosis not present

## 2013-01-08 DIAGNOSIS — D509 Iron deficiency anemia, unspecified: Secondary | ICD-10-CM | POA: Insufficient documentation

## 2013-01-08 DIAGNOSIS — I70219 Atherosclerosis of native arteries of extremities with intermittent claudication, unspecified extremity: Secondary | ICD-10-CM | POA: Insufficient documentation

## 2013-01-08 DIAGNOSIS — K31811 Angiodysplasia of stomach and duodenum with bleeding: Secondary | ICD-10-CM | POA: Insufficient documentation

## 2013-01-08 DIAGNOSIS — K625 Hemorrhage of anus and rectum: Secondary | ICD-10-CM | POA: Insufficient documentation

## 2013-01-08 DIAGNOSIS — K31819 Angiodysplasia of stomach and duodenum without bleeding: Secondary | ICD-10-CM | POA: Diagnosis not present

## 2013-01-08 DIAGNOSIS — D126 Benign neoplasm of colon, unspecified: Secondary | ICD-10-CM | POA: Insufficient documentation

## 2013-01-08 HISTORY — PX: COLONOSCOPY: SHX5424

## 2013-01-08 HISTORY — DX: Chronic obstructive pulmonary disease, unspecified: J44.9

## 2013-01-08 HISTORY — DX: Cardiac murmur, unspecified: R01.1

## 2013-01-08 HISTORY — DX: Acute embolism and thrombosis of unspecified deep veins of right lower extremity: I82.401

## 2013-01-08 HISTORY — PX: ESOPHAGOGASTRODUODENOSCOPY: SHX5428

## 2013-01-08 LAB — GLUCOSE, CAPILLARY: Glucose-Capillary: 116 mg/dL — ABNORMAL HIGH (ref 70–99)

## 2013-01-08 SURGERY — EGD (ESOPHAGOGASTRODUODENOSCOPY)
Anesthesia: Moderate Sedation

## 2013-01-08 MED ORDER — GLUCAGON HCL (RDNA) 1 MG IJ SOLR
INTRAMUSCULAR | Status: DC | PRN
  Administered 2013-01-08: .5 mg via INTRAVENOUS

## 2013-01-08 MED ORDER — DIPHENHYDRAMINE HCL 50 MG/ML IJ SOLN
INTRAMUSCULAR | Status: AC
  Filled 2013-01-08: qty 1

## 2013-01-08 MED ORDER — GLUCAGON HCL (RDNA) 1 MG IJ SOLR
INTRAMUSCULAR | Status: AC
  Filled 2013-01-08: qty 1

## 2013-01-08 MED ORDER — MIDAZOLAM HCL 10 MG/2ML IJ SOLN
INTRAMUSCULAR | Status: DC | PRN
  Administered 2013-01-08 (×7): 2 mg via INTRAVENOUS

## 2013-01-08 MED ORDER — MIDAZOLAM HCL 10 MG/2ML IJ SOLN
INTRAMUSCULAR | Status: AC
  Filled 2013-01-08: qty 4

## 2013-01-08 MED ORDER — SODIUM CHLORIDE 0.9 % IV SOLN
INTRAVENOUS | Status: DC
  Administered 2013-01-08: 12:00:00 via INTRAVENOUS

## 2013-01-08 MED ORDER — FENTANYL CITRATE 0.05 MG/ML IJ SOLN
INTRAMUSCULAR | Status: AC
  Filled 2013-01-08: qty 4

## 2013-01-08 MED ORDER — FENTANYL CITRATE 0.05 MG/ML IJ SOLN
INTRAMUSCULAR | Status: DC | PRN
  Administered 2013-01-08 (×5): 25 ug via INTRAVENOUS

## 2013-01-08 NOTE — H&P (View-Only) (Signed)
  See the written copy of this report in the patient's paper medical record.  These results did not interface directly into the electronic medical record and are summarized here. PAD with claudication. New anemia noted on pre procedure labs will need evaluation. Will proceed with diagnostic angiogram.  

## 2013-01-08 NOTE — Op Note (Signed)
[facility] [address] [place], [address]   OPERATIVE PROCEDURE REPORT  PATIENT: [name], [name]  MR#: [id number] BIRTHDATE: [birth year]  GENDER: Female ENDOSCOPIST: Jeani Hawking, MD ASSISTANT:   Karoline Caldwell, RN, BSN Windell Hummingbird, technician PROCEDURE DATE: 01/08/2013 PROCEDURE:   Colonoscopy with snare polypectomy ASA CLASS:   Class III INDICATIONS:Iron Deficiency Anemia. MEDICATIONS: Versed 4 mg IV and Fentanyl 25 mcg IV  DESCRIPTION OF PROCEDURE:   After the risks benefits and alternatives of the procedure were thoroughly explained, informed consent was obtained.  A digital rectal exam revealed no abnormalities of the rectum.    The     endoscope was introduced through the anus  and advanced to the terminal ileum which was intubated for a short distance , No adverse events experienced. The quality of the prep was good. .  The instrument was then slowly withdrawn as the colon was fully examined.   FINDINGS: The colonoscopy was very difficult to perform as positioning was hard to maintain.  Flecks of blood were noted in the colon and the source is felt to be from the duodenum with the findigns of the AVMs.  No evidence of any inflammation, ulcerations, erosions, or vascular abnormalities in the colon.  The TI was normal.  Three 3-4 mm sessile ascending colon polyps were removed with a cold snare.  Two large proximal transverse colon polyps were identified and removed with a hot snare.  The polyps were 1.5-2 cm in size.  A few sigmoid diverticula were found. Multiple polyps were noted in the rectosigmoid region.  A semipedunculated polyp was removed with a hot snare.  The other 3 mm sessile polyps were removed with a cold snare.  These were representative samples.  Some smaller benign appearing polyps were noted in a couple parts of the colon.  They were not removed as a result of the difficulty and length of the procedure.          The scope was then  withdrawn from the patient and the procedure terminated.  COMPLICATIONS: There were no complications.  IMPRESSION: 1) Polyps. 2) Diverticula. 3) Hemorrhoids.  RECOMMENDATIONS: 1) Follow up biopsies and continue to old Plavix. 2) Repeat colonoscopy in 3 years.  _______________________________ eSignedJeani Hawking, MD 01/08/2013 1:28 PM

## 2013-01-08 NOTE — Op Note (Signed)
[facility] [address] [place], [address]   OPERATIVE PROCEDURE REPORT  PATIENT: [name], [name]  MR#: [id number] BIRTHDATE: [birth year]  GENDER: Female ENDOSCOPIST: Jeani Hawking, MD ASSISTANT:   Karoline Caldwell, RN, BSN and Windell Hummingbird, technician PROCEDURE DATE: 01/08/2013 PROCEDURE:   EGD w/ control of bleeding ASA CLASS:   Class III INDICATIONS:Anemia. MEDICATIONS: Versed 10 mg IV, Fentanyl 100 mcg IV, and Benadryl 25 mg IV TOPICAL ANESTHETIC:   none  DESCRIPTION OF PROCEDURE:   After the risks benefits and alternatives of the procedure were thoroughly explained, informed consent was obtained.  The Pentax Gastroscope [id number]  endoscope was introduced through the mouth  and advanced to the third portion of the duodenum Without limitations.      The instrument was slowly withdrawn as the mucosa was fully examined.      FINDINGS: A 3-4 cm hiatal hernia was identified.  In the gastric lumen hematin was noted and a single nonbleeding AVM was ablated in the body of the stomach.  In the duodenum fresh blood was noted and multiple AVMs were found.  The largest AVM was in the most proximal portion of the duodenal bulb.  A total of 10 AVMs were identified and ablated.  No evidence of any reaccumulation of blood, but there can still be hidden AVMs.          The scope was then withdrawn from the patient and the procedure terminated.  COMPLICATIONS: There were no complications.  IMPRESSION: 1)  Bleeding gastric and small bowel AVMs s/p APC. 2) 34- cm hiatal hernia.  RECOMMENDATIONS: 1) Continue to hold Plavix. 2) Follow HGB.  _______________________________ eSignedJeani Hawking, MD 01/08/2013 12:34 PM

## 2013-01-08 NOTE — Interval H&P Note (Signed)
History and Physical Interval Note:  01/08/2013 11:29 AM  [name]  has presented today for surgery, with the diagnosis of rectal bleeding  The various methods of treatment have been discussed with the patient and family. After consideration of risks, benefits and other options for treatment, the patient has consented to  Procedure(s): ESOPHAGOGASTRODUODENOSCOPY (EGD) (N/A) COLONOSCOPY (N/A) as a surgical intervention .  The patient's history has been reviewed, patient examined, no change in status, stable for surgery.  I have reviewed the patient's chart and labs.  Questions were answered to the patient's satisfaction.     Travarus Trudo D

## 2013-01-11 ENCOUNTER — Encounter (HOSPITAL_COMMUNITY): Payer: Self-pay | Admitting: Gastroenterology

## 2013-02-05 ENCOUNTER — Ambulatory Visit: Payer: PRIVATE HEALTH INSURANCE | Admitting: Dietician

## 2013-02-16 ENCOUNTER — Ambulatory Visit (HOSPITAL_COMMUNITY): Admission: RE | Admit: 2013-02-16 | Payer: PRIVATE HEALTH INSURANCE | Source: Ambulatory Visit | Admitting: Cardiology

## 2013-02-16 ENCOUNTER — Encounter (HOSPITAL_COMMUNITY): Admission: RE | Payer: Self-pay | Source: Ambulatory Visit

## 2013-02-16 SURGERY — LEFT HEART CATHETERIZATION WITH CORONARY ANGIOGRAM
Anesthesia: LOCAL

## 2013-03-01 ENCOUNTER — Encounter: Payer: PRIVATE HEALTH INSURANCE | Attending: Internal Medicine | Admitting: Dietician

## 2013-03-01 ENCOUNTER — Encounter: Payer: Self-pay | Admitting: Dietician

## 2013-03-01 VITALS — Wt 200.9 lb

## 2013-03-01 DIAGNOSIS — E119 Type 2 diabetes mellitus without complications: Secondary | ICD-10-CM | POA: Diagnosis not present

## 2013-03-01 DIAGNOSIS — F17200 Nicotine dependence, unspecified, uncomplicated: Secondary | ICD-10-CM | POA: Insufficient documentation

## 2013-03-01 DIAGNOSIS — I10 Essential (primary) hypertension: Secondary | ICD-10-CM

## 2013-03-01 DIAGNOSIS — Z713 Dietary counseling and surveillance: Secondary | ICD-10-CM | POA: Insufficient documentation

## 2013-03-01 NOTE — Progress Notes (Signed)
A: Pt here for F/U on DM type II education. Recent CBC reflects Iron deficiency anemia and she has been prescribed high dose Iron supplements.  She also c/o of significant confusion as to what she can/should be eating with type II DM, hx of gout, and HTN. In particular, she has drastically limited protein foods in relation to this confusion. She has also been so tired from the significant anemia that she has had very little physical activity until taking the additional Iron for a few weeks. She claims she began walking regularly about a week ago.  Dx: Food and nutrition-related knowledge deficit as related to gout, HTN, and DM nutrition therapy as evidenced by pt statements of confusion.  I: RD provided a list of best protein foods to limit sodium and animal fat intake, with adjustments to note those richest in purines. RD also highlighted the importance of cooking protein foods from scratch as opposed to choosing salted, cured meats, deli meats, or canned meats.  RD demonstrated the plate method for controlling portion sizes of proteins, starches, and increasing portions of nonstarchy vegetables.  Pt expressed understanding via the teach back method, and was in better spirits by the end of the appointment.   M/E: Monitor pt understanding of diet recommendations, BG control, exercise, weight. F/U in 3 months.

## 2013-03-03 ENCOUNTER — Encounter (HOSPITAL_COMMUNITY): Payer: Self-pay | Admitting: Pharmacy Technician

## 2013-03-09 ENCOUNTER — Encounter (HOSPITAL_COMMUNITY)
Admission: RE | Disposition: A | Discharge status: Home / Self Care | Payer: Medicare Other | Source: Ambulatory Visit | Attending: Cardiology

## 2013-03-09 ENCOUNTER — Ambulatory Visit (HOSPITAL_COMMUNITY)
Admission: RE | Admit: 2013-03-09 | Discharge: 2013-03-09 | Disposition: A | Discharge status: Home / Self Care | Payer: PRIVATE HEALTH INSURANCE | Source: Ambulatory Visit | Attending: Cardiology | Admitting: Cardiology

## 2013-03-09 DIAGNOSIS — E785 Hyperlipidemia, unspecified: Secondary | ICD-10-CM | POA: Diagnosis not present

## 2013-03-09 DIAGNOSIS — I10 Essential (primary) hypertension: Secondary | ICD-10-CM | POA: Insufficient documentation

## 2013-03-09 DIAGNOSIS — I209 Angina pectoris, unspecified: Secondary | ICD-10-CM | POA: Diagnosis present

## 2013-03-09 DIAGNOSIS — F17200 Nicotine dependence, unspecified, uncomplicated: Secondary | ICD-10-CM | POA: Diagnosis not present

## 2013-03-09 DIAGNOSIS — E119 Type 2 diabetes mellitus without complications: Secondary | ICD-10-CM | POA: Diagnosis not present

## 2013-03-09 DIAGNOSIS — I2510 Atherosclerotic heart disease of native coronary artery without angina pectoris: Secondary | ICD-10-CM | POA: Insufficient documentation

## 2013-03-09 DIAGNOSIS — Z713 Dietary counseling and surveillance: Secondary | ICD-10-CM | POA: Diagnosis not present

## 2013-03-09 HISTORY — PX: LEFT HEART CATHETERIZATION WITH CORONARY ANGIOGRAM: SHX5451

## 2013-03-09 SURGERY — LEFT HEART CATHETERIZATION WITH CORONARY ANGIOGRAM
Anesthesia: LOCAL

## 2013-03-09 MED ORDER — SODIUM CHLORIDE 0.9 % IV SOLN: INTRAVENOUS | Status: DC

## 2013-03-09 MED ORDER — ACETAMINOPHEN 325 MG PO TABS: 650.0000 mg | ORAL_TABLET | ORAL | Status: DC | PRN

## 2013-03-09 MED ORDER — MIDAZOLAM HCL 2 MG/2ML IJ SOLN
INTRAMUSCULAR | Status: AC
  Filled 2013-03-09: qty 2

## 2013-03-09 MED ORDER — SODIUM CHLORIDE 0.9 % IJ SOLN: 3.0000 mL | Freq: Two times a day (BID) | INTRAMUSCULAR | Status: DC

## 2013-03-09 MED ORDER — VERAPAMIL HCL 2.5 MG/ML IV SOLN
INTRAVENOUS | Status: AC
  Filled 2013-03-09: qty 2

## 2013-03-09 MED ORDER — SODIUM CHLORIDE 0.9 % IV SOLN: 1.0000 mL/kg/h | INTRAVENOUS | Status: DC

## 2013-03-09 MED ORDER — HEPARIN (PORCINE) IN NACL 2-0.9 UNIT/ML-% IJ SOLN
INTRAMUSCULAR | Status: AC
  Filled 2013-03-09: qty 1000

## 2013-03-09 MED ORDER — HYDROMORPHONE HCL PF 2 MG/ML IJ SOLN
INTRAMUSCULAR | Status: AC
  Filled 2013-03-09: qty 1

## 2013-03-09 MED ORDER — SODIUM CHLORIDE 0.9 % IJ SOLN: 3.0000 mL | INTRAMUSCULAR | Status: DC | PRN

## 2013-03-09 MED ORDER — ONDANSETRON HCL 4 MG/2ML IJ SOLN: 4.0000 mg | Freq: Four times a day (QID) | INTRAMUSCULAR | Status: DC | PRN

## 2013-03-09 MED ORDER — FENTANYL CITRATE 0.05 MG/ML IJ SOLN
INTRAMUSCULAR | Status: AC
  Filled 2013-03-09: qty 2

## 2013-03-09 MED ORDER — ADENOSINE 12 MG/4ML IV SOLN
16.0000 mL | INTRAVENOUS | Status: AC
  Administered 2013-03-09: 48 mg via INTRAVENOUS
  Filled 2013-03-09: qty 16

## 2013-03-09 MED ORDER — LIDOCAINE HCL (PF) 1 % IJ SOLN
INTRAMUSCULAR | Status: AC
  Filled 2013-03-09: qty 30

## 2013-03-09 MED ORDER — HEPARIN SODIUM (PORCINE) 1000 UNIT/ML IJ SOLN
INTRAMUSCULAR | Status: AC
  Filled 2013-03-09: qty 1

## 2013-03-09 MED ORDER — SODIUM CHLORIDE 0.9 % IV SOLN: 250.0000 mL | INTRAVENOUS | Status: DC | PRN

## 2013-03-09 NOTE — Interval H&P Note (Signed)
History and Physical Interval Note:  03/09/2013 8:35 AM  [name]  has presented today for surgery, with the diagnosis of Chest pain  The various methods of treatment have been discussed with the patient and family. After consideration of risks, benefits and other options for treatment, the patient has consented to  Procedure(s): LEFT HEART CATHETERIZATION WITH CORONARY ANGIOGRAM (N/A) and possible PCI as a surgical intervention .  The patient's history has been reviewed, patient examined, no change in status, stable for surgery.  I have reviewed the patient's chart and labs.  Questions were answered to the patient's satisfaction.   Cath Lab Visit (complete for each Cath Lab visit)  Clinical Evaluation Leading to the Procedure:   ACS: no  Non-ACS:    Anginal Classification: CCS III  Anti-ischemic medical therapy: Minimal Therapy (1 class of medications)  Non-Invasive Test Results: Low-risk stress test findings: cardiac mortality <1%/year  Prior CABG: No previous CABG        [facility]

## 2013-03-09 NOTE — CV Procedure (Signed)
Procedure performed:  Left heart catheterization including FFR to the LAD, selective right and left coronary arteriography. Ascending aortogram. No LV gram performed.  Indication patient is a 62 year-old AA female with history of hypertension,  Hyperlipidemia, GI Bleed  who presents with class 3 angina pectoris. Patient has  had non invasive testing which was normal.  Due to persistent symptoms of chest pain,  is brought to the cardiac catheterization lab to evaluate the  coronary anatomy for definitive diagnosis of CAD.  Hemodynamic data:  Aortic pressure 116/61 mean 78 mm Hg.  Right coronary artery: The vessel is  Dominant. There is mild to moderate diffuse luminal irregularity, midsegment of the right coronary artery has a 60 to at most 70% stenosis. Mild calcification is evident total proximal RCA.  Left main coronary artery is large and normal.  Circumflex coronary artery: A large vessel giving origin to a large high obtuse marginal 1. It is smooth and has mild luminal irregularity.   LAD:  LAD gives origin to a large diagonal-1 and D2. At the origin of the D2, the LAD showed a 60-70% stenosis. The rest of the LAD has mild luminal irregularities.   Ascending aortogram: The ascending aorta shows moderate amount of calcification and the root appears to be normal sized although the ascending aorta appears to show mild ectasia. The aortic valve is calcified and I could not easily cross the valve, bicuspid aortic valve in the excluded. There is no significant regurgitation.  FFR to the mid LAD: FFR was performed using Primewire in the usual fashion, using confined to correlation and adenosine for the IV infusion. There was no baseline pressure gradient across the stenosis, with intravenous adenosine infusion in the standard fashion, the FFR dropped to 0.86, the stenosis was felt to be insignificant and left alone.  Impression: Moderate disease involving the mid LAD of 60%, moderate disease of  the mid RCA 60%, moderate calcification of the ascending aorta with mild ascending aortic ectasia, mild to moderate aortic valve calcification, cannot exclude bicuspid aortic valve. I did not cross the valve. This can be further evaluated by careful TTE.  Technique: Under sterile precautions using a 6 French right radial  arterial access, a 6 French sheath was introduced into the right radial artery. A 5 [place] Tig 4 catheter was advanced into the ascending aorta selective  right coronary artery and left coronary artery was cannulated and angiography was performed in multiple views. The same catheter was utilized to perform ascending aortogram.  The catheter was pulled back Out of the body over exchange length J-wire.  After keeping the ACT greater than 200, I utilized Primewire to perform FFR to the mid LAD in the usual fashion. Angiography was performed the guidewire was withdrawn and there diagnostic TIG 4 catheter was pulled out of the body over the J-wire. Catheter exchanged out of the body over J-Wire. NO immediate complications noted. Patient tolerated the procedure well.   Rec: Medical therapy with aggressive risk factor reduction.   Disposition: Will be discharged home today with outpatient follow up.

## 2013-03-09 NOTE — H&P (Signed)
  Please see office visit notes for complete details of HPI.  

## 2013-03-28 ENCOUNTER — Encounter (HOSPITAL_COMMUNITY): Payer: Self-pay | Admitting: Nurse Practitioner

## 2013-03-28 ENCOUNTER — Emergency Department (HOSPITAL_COMMUNITY)
Admission: EM | Admit: 2013-03-28 | Discharge: 2013-03-28 | Disposition: A | Discharge status: Home / Self Care | Payer: Medicare Other | Attending: Emergency Medicine | Admitting: Emergency Medicine

## 2013-03-28 DIAGNOSIS — Z86718 Personal history of other venous thrombosis and embolism: Secondary | ICD-10-CM | POA: Insufficient documentation

## 2013-03-28 DIAGNOSIS — J449 Chronic obstructive pulmonary disease, unspecified: Secondary | ICD-10-CM | POA: Insufficient documentation

## 2013-03-28 DIAGNOSIS — Z8719 Personal history of other diseases of the digestive system: Secondary | ICD-10-CM | POA: Insufficient documentation

## 2013-03-28 DIAGNOSIS — J4489 Other specified chronic obstructive pulmonary disease: Secondary | ICD-10-CM | POA: Insufficient documentation

## 2013-03-28 DIAGNOSIS — F411 Generalized anxiety disorder: Secondary | ICD-10-CM | POA: Insufficient documentation

## 2013-03-28 DIAGNOSIS — R519 Headache, unspecified: Secondary | ICD-10-CM

## 2013-03-28 DIAGNOSIS — R51 Headache: Secondary | ICD-10-CM | POA: Insufficient documentation

## 2013-03-28 DIAGNOSIS — F17200 Nicotine dependence, unspecified, uncomplicated: Secondary | ICD-10-CM | POA: Insufficient documentation

## 2013-03-28 DIAGNOSIS — R011 Cardiac murmur, unspecified: Secondary | ICD-10-CM | POA: Insufficient documentation

## 2013-03-28 DIAGNOSIS — E119 Type 2 diabetes mellitus without complications: Secondary | ICD-10-CM | POA: Insufficient documentation

## 2013-03-28 DIAGNOSIS — Z79899 Other long term (current) drug therapy: Secondary | ICD-10-CM | POA: Insufficient documentation

## 2013-03-28 DIAGNOSIS — G8929 Other chronic pain: Secondary | ICD-10-CM | POA: Insufficient documentation

## 2013-03-28 DIAGNOSIS — E785 Hyperlipidemia, unspecified: Secondary | ICD-10-CM | POA: Insufficient documentation

## 2013-03-28 DIAGNOSIS — M129 Arthropathy, unspecified: Secondary | ICD-10-CM | POA: Insufficient documentation

## 2013-03-28 DIAGNOSIS — I10 Essential (primary) hypertension: Secondary | ICD-10-CM | POA: Insufficient documentation

## 2013-03-28 MED ORDER — KETOROLAC TROMETHAMINE 15 MG/ML IJ SOLN
30.0000 mg | Freq: Once | INTRAMUSCULAR | Status: AC
  Administered 2013-03-28: 30 mg via INTRAVENOUS
  Filled 2013-03-28: qty 2

## 2013-03-28 MED ORDER — SODIUM CHLORIDE 0.9 % IV BOLUS (SEPSIS)
1000.0000 mL | Freq: Once | INTRAVENOUS | Status: AC
  Administered 2013-03-28: 1000 mL via INTRAVENOUS

## 2013-03-28 MED ORDER — METOCLOPRAMIDE HCL 5 MG/ML IJ SOLN
10.0000 mg | Freq: Once | INTRAMUSCULAR | Status: AC
  Administered 2013-03-28: 10 mg via INTRAVENOUS
  Filled 2013-03-28: qty 2

## 2013-03-28 MED ORDER — DIPHENHYDRAMINE HCL 50 MG/ML IJ SOLN
25.0000 mg | Freq: Once | INTRAMUSCULAR | Status: AC
  Administered 2013-03-28: 25 mg via INTRAVENOUS
  Filled 2013-03-28: qty 1

## 2013-03-28 NOTE — ED Notes (Signed)
Pt states she woke this am with severe headache and checked and her BP was 200s systolic at home. States she has been taking BP meds as prescribed.

## 2013-03-28 NOTE — ED Notes (Signed)
Dr. Kohut at bedside 

## 2013-03-28 NOTE — ED Provider Notes (Signed)
CSN: [id number]     Arrival date & time 03/28/13  1517 History   First MD Initiated Contact with Patient 03/28/13 1659     Chief Complaint  Patient presents with  . Headache  . Hypertension   (Consider location/radiation/quality/duration/timing/severity/associated sxs/prior Treatment) HPI  62 year old female with headache. Patient woke up with it this morning. Headache is diffuse. She's not sure she has ever had a headache like this before. No fevers or chills. No neck pain or neck stiffness. Denies any trauma. No use of blood thinning medications. Photophobia, otherwise no visual complaints. No acute numbness, tingling or loss of strength. No intervention prior to arrival.  Past Medical History  Diagnosis Date  . Hypertension   . Hyperlipemia   . Chronic pain   . Anxiety   . Depression   . GERD (gastroesophageal reflux disease)   . Diabetes mellitus without complication   . COPD (chronic obstructive pulmonary disease)   . Heart murmur   . Arthritis   . Deep vein blood clot of right lower extremity 12/29/2012    Dr. Nadara Eaton, stent in place   Past Surgical History  Procedure Laterality Date  . Cesarean section    . Uterine fibroid surgery    . Hand surgery    . Abdominal hysterectomy    . Stent in right leg  12/29/2012    for blood clot, Dr. Nadara Eaton  . Esophagogastroduodenoscopy N/A 01/08/2013    Procedure: ESOPHAGOGASTRODUODENOSCOPY (EGD);  Surgeon: Theda Belfast, MD;  Location: [facility];  Service: Endoscopy;  Laterality: N/A;  . Colonoscopy N/A 01/08/2013    Procedure: COLONOSCOPY;  Surgeon: Theda Belfast, MD;  Location: [facility];  Service: Endoscopy;  Laterality: N/A;   History reviewed. No pertinent family history. History  Substance Use Topics  . Smoking status: Current Every Day Smoker -- 0.50 packs/day for 41 years    Types: Cigarettes  . Smokeless tobacco: Never Used  . Alcohol Use: No   OB History   Grav Para Term Preterm Abortions TAB SAB Ect Mult Living                Review of Systems  All systems reviewed and negative, other than as noted in HPI.   Allergies  Review of patient's allergies indicates no known allergies.  Home Medications   Current Outpatient Rx  Name  Route  Sig  Dispense  Refill  . albuterol (PROVENTIL HFA;VENTOLIN HFA) 108 (90 BASE) MCG/ACT inhaler   Inhalation   Inhale 2 puffs into the lungs every 6 (six) hours as needed for wheezing. For shortness of breath         . Ascorbic Acid (VITAMIN C PO)   Oral   Take 1 tablet by mouth 2 (two) times a week. No specific days of the week.         Marland Kitchen atorvastatin (LIPITOR) 40 MG tablet   Oral   Take 40 mg by mouth every morning.         . cholecalciferol (VITAMIN D) 1000 UNITS tablet   Oral   Take 1,000 Units by mouth daily.         Marland Kitchen gabapentin (NEURONTIN) 300 MG capsule   Oral   Take 300 mg by mouth at bedtime.         . IRON PO   Oral   Take 1 tablet by mouth daily.         Marland Kitchen lisinopril-hydrochlorothiazide (PRINZIDE,ZESTORETIC) 20-25 MG per tablet   Oral  Take 1 tablet by mouth daily.         . metFORMIN (GLUCOPHAGE) 500 MG tablet   Oral   Take 0.5 tablets (250 mg total) by mouth 2 (two) times daily with a meal.           Start this medication after tomorrow.   . nitroGLYCERIN (NITROSTAT) 0.4 MG SL tablet   Sublingual   Place 0.4 mg under the tongue every 5 (five) minutes as needed for chest pain.          BP 135/97  Pulse 50  Temp(Src) 98.7 F (37.1 C) (Oral)  Resp 18  Ht 4\' 11"  (1.499 m)  Wt 200 lb (90.719 kg)  BMI 40.37 kg/m2  SpO2 100% Physical Exam  Nursing note and vitals reviewed. Constitutional: She is oriented to person, place, and time. She appears well-developed and well-nourished. No distress.  HENT:  Head: Normocephalic and atraumatic.  Eyes: Conjunctivae are normal. Pupils are equal, round, and reactive to light. Right eye exhibits no discharge. Left eye exhibits no discharge.  Neck: Neck supple.  No  nuchal rigidity  Cardiovascular: Normal rate, regular rhythm and normal heart sounds.  Exam reveals no gallop and no friction rub.   No murmur heard. Pulmonary/Chest: Effort normal and breath sounds normal. No respiratory distress.  Abdominal: Soft. She exhibits no distension. There is no tenderness.  Musculoskeletal: She exhibits no edema and no tenderness.  Neurological: She is alert and oriented to person, place, and time. No cranial nerve deficit. She exhibits normal muscle tone. Coordination normal.  Speech is clear. Content is appropriate. Gait is steady.  Skin: Skin is warm and dry.  Psychiatric: She has a normal mood and affect. Her behavior is normal. Thought content normal.    ED Course  Procedures (including critical care time) Labs Review Labs Reviewed - No data to display Imaging Review No results found.  MDM   1. Headache    62yF with HA. Suspect primary HA. Consider emergent secondary causes such as bleed, infectious or mass but doubt. There is no history of trauma. Pt has a nonfocal neurological exam. Afebrile and neck supple. No use of blood thinning medication. Consider ocular etiology such as acute angle closure glaucoma but doubt. Pt denies acute change in visual acuity and eye exam unremarkable. Doubt temporal arteritis given age, no temporal tenderness and temporal artery pulsations palpable. Doubt CO poisoning. No contacts with similar symptoms. Doubt venous thrombosis. Doubt carotid or vertebral arteries dissection. Symptoms improved with meds. Feel that can be safely discharged, but strict return precautions discussed. Outpt fu.     Raeford Razor, MD 04/05/13 [phone]

## 2013-03-28 NOTE — ED Notes (Signed)
Pt comfortable with d/c and f/u instructions. Pt ambulatory from room. No prescriptions.

## 2013-04-03 ENCOUNTER — Encounter (HOSPITAL_COMMUNITY): Payer: Self-pay | Admitting: Emergency Medicine

## 2013-04-03 ENCOUNTER — Emergency Department (HOSPITAL_COMMUNITY)
Admission: EM | Admit: 2013-04-03 | Discharge: 2013-04-03 | Disposition: A | Discharge status: Home / Self Care | Payer: Medicare Other | Attending: Emergency Medicine | Admitting: Emergency Medicine

## 2013-04-03 DIAGNOSIS — I10 Essential (primary) hypertension: Secondary | ICD-10-CM | POA: Insufficient documentation

## 2013-04-03 DIAGNOSIS — E785 Hyperlipidemia, unspecified: Secondary | ICD-10-CM | POA: Insufficient documentation

## 2013-04-03 DIAGNOSIS — Z86718 Personal history of other venous thrombosis and embolism: Secondary | ICD-10-CM | POA: Insufficient documentation

## 2013-04-03 DIAGNOSIS — J449 Chronic obstructive pulmonary disease, unspecified: Secondary | ICD-10-CM | POA: Insufficient documentation

## 2013-04-03 DIAGNOSIS — F411 Generalized anxiety disorder: Secondary | ICD-10-CM | POA: Diagnosis not present

## 2013-04-03 DIAGNOSIS — M25473 Effusion, unspecified ankle: Secondary | ICD-10-CM | POA: Diagnosis present

## 2013-04-03 DIAGNOSIS — G8929 Other chronic pain: Secondary | ICD-10-CM | POA: Insufficient documentation

## 2013-04-03 DIAGNOSIS — Z8719 Personal history of other diseases of the digestive system: Secondary | ICD-10-CM | POA: Diagnosis not present

## 2013-04-03 DIAGNOSIS — M129 Arthropathy, unspecified: Secondary | ICD-10-CM | POA: Diagnosis not present

## 2013-04-03 DIAGNOSIS — M25471 Effusion, right ankle: Secondary | ICD-10-CM

## 2013-04-03 DIAGNOSIS — J4489 Other specified chronic obstructive pulmonary disease: Secondary | ICD-10-CM | POA: Insufficient documentation

## 2013-04-03 DIAGNOSIS — Z79899 Other long term (current) drug therapy: Secondary | ICD-10-CM | POA: Insufficient documentation

## 2013-04-03 DIAGNOSIS — F17200 Nicotine dependence, unspecified, uncomplicated: Secondary | ICD-10-CM | POA: Diagnosis not present

## 2013-04-03 DIAGNOSIS — R011 Cardiac murmur, unspecified: Secondary | ICD-10-CM | POA: Insufficient documentation

## 2013-04-03 DIAGNOSIS — F3289 Other specified depressive episodes: Secondary | ICD-10-CM | POA: Insufficient documentation

## 2013-04-03 DIAGNOSIS — M25476 Effusion, unspecified foot: Secondary | ICD-10-CM | POA: Insufficient documentation

## 2013-04-03 DIAGNOSIS — E119 Type 2 diabetes mellitus without complications: Secondary | ICD-10-CM | POA: Insufficient documentation

## 2013-04-03 DIAGNOSIS — F329 Major depressive disorder, single episode, unspecified: Secondary | ICD-10-CM | POA: Insufficient documentation

## 2013-04-03 LAB — POCT I-STAT, CHEM 8
BUN: 9 mg/dL (ref 6–23)
Calcium, Ion: 1.24 mmol/L (ref 1.13–1.30)
Creatinine, Ser: 0.9 mg/dL (ref 0.50–1.10)
Glucose, Bld: 92 mg/dL (ref 70–99)
HCT: 38 % (ref 36.0–46.0)
Hemoglobin: 12.9 g/dL (ref 12.0–15.0)
Potassium: 3.8 mEq/L (ref 3.5–5.1)
Sodium: 141 mEq/L (ref 135–145)
TCO2: 26 mmol/L (ref 0–100)

## 2013-04-03 LAB — URINALYSIS, ROUTINE W REFLEX MICROSCOPIC
Bilirubin Urine: NEGATIVE
Nitrite: NEGATIVE
Protein, ur: NEGATIVE mg/dL
Specific Gravity, Urine: 1.018 (ref 1.005–1.030)
Urobilinogen, UA: 0.2 mg/dL (ref 0.0–1.0)

## 2013-04-03 LAB — URINE MICROSCOPIC-ADD ON

## 2013-04-03 NOTE — ED Notes (Signed)
Pt. Stated, I've ran out of my pain medication and my back has been killing me.

## 2013-04-03 NOTE — ED Provider Notes (Signed)
CSN: [id number]     Arrival date & time 04/03/13  1002 History   First MD Initiated Contact with Patient 04/03/13 1011     Chief Complaint  Patient presents with  . Joint Swelling   (Consider location/radiation/quality/duration/timing/severity/associated sxs/prior Treatment) HPI Comments: Patient is a 62 year old female with a history of DVT in her right lower extremity s/p peripheral stenting of right SFA on 12/29/12, diabetes mellitus, and hypertension who presents for swelling in her bilateral ankles x5 days. Patient states that swelling improves with elevation and worsens when she is on her feet for prolonged periods of time. Patient denies associated erythema and heat to touch as well as any calf tenderness or claudication. She further denies associated fevers, syncope, SOB, CP, pallor, numbness/tingling, weakness, prolonged travel, recent surgeries/hospitalizations, and HRT. Patient is not currently on any blood thinners.  The history is provided by the patient. No language interpreter was used.    Past Medical History  Diagnosis Date  . Hypertension   . Hyperlipemia   . Chronic pain   . Anxiety   . Depression   . GERD (gastroesophageal reflux disease)   . Diabetes mellitus without complication   . COPD (chronic obstructive pulmonary disease)   . Heart murmur   . Arthritis   . Deep vein blood clot of right lower extremity 12/29/2012    Dr. Nadara Eaton, stent in place   Past Surgical History  Procedure Laterality Date  . Cesarean section    . Uterine fibroid surgery    . Hand surgery    . Abdominal hysterectomy    . Stent in right leg  12/29/2012    for blood clot, Dr. Nadara Eaton  . Esophagogastroduodenoscopy N/A 01/08/2013    Procedure: ESOPHAGOGASTRODUODENOSCOPY (EGD);  Surgeon: Theda Belfast, MD;  Location: [facility];  Service: Endoscopy;  Laterality: N/A;  . Colonoscopy N/A 01/08/2013    Procedure: COLONOSCOPY;  Surgeon: Theda Belfast, MD;  Location: [facility];  Service:  Endoscopy;  Laterality: N/A;   No family history on file. History  Substance Use Topics  . Smoking status: Current Every Day Smoker -- 0.50 packs/day for 41 years    Types: Cigarettes  . Smokeless tobacco: Never Used  . Alcohol Use: No   OB History   Grav Para Term Preterm Abortions TAB SAB Ect Mult Living                 Review of Systems  Constitutional: Negative for fever.  Respiratory: Negative for shortness of breath.   Cardiovascular: Positive for leg swelling. Negative for chest pain.  Musculoskeletal: Positive for joint swelling (b/l ankles). Negative for arthralgias.  Skin: Negative for color change and pallor.  Neurological: Negative for weakness and numbness.  All other systems reviewed and are negative.    Allergies  Review of patient's allergies indicates no known allergies.  Home Medications   Current Outpatient Rx  Name  Route  Sig  Dispense  Refill  . albuterol (PROVENTIL HFA;VENTOLIN HFA) 108 (90 BASE) MCG/ACT inhaler   Inhalation   Inhale 2 puffs into the lungs every 6 (six) hours as needed for wheezing. For shortness of breath         . Ascorbic Acid (VITAMIN C PO)   Oral   Take 1 tablet by mouth daily as needed (Energy). No specific days of the week.         Marland Kitchen atorvastatin (LIPITOR) 40 MG tablet   Oral   Take 40 mg by mouth every morning.         Marland Kitchen  gabapentin (NEURONTIN) 300 MG capsule   Oral   Take 300 mg by mouth at bedtime.          . IRON PO   Oral   Take 1 tablet by mouth daily.         Marland Kitchen lisinopril (PRINIVIL,ZESTRIL) 10 MG tablet   Oral   Take 10 mg by mouth daily.         . metFORMIN (GLUCOPHAGE) 500 MG tablet   Oral   Take 0.5 tablets (250 mg total) by mouth 2 (two) times daily with a meal.           Start this medication after tomorrow.   . metoprolol succinate (TOPROL-XL) 25 MG 24 hr tablet   Oral   Take 25 mg by mouth daily.         . nitroGLYCERIN (NITROSTAT) 0.4 MG SL tablet   Sublingual   Place 0.4  mg under the tongue every 5 (five) minutes as needed for chest pain.          BP 177/90  Pulse 49  Temp(Src) 98.2 F (36.8 C) (Oral)  SpO2 96%  Physical Exam  Nursing note and vitals reviewed. Constitutional: She is oriented to person, place, and time. She appears well-developed and well-nourished. No distress.  HENT:  Head: Normocephalic and atraumatic.  Eyes: Conjunctivae and EOM are normal. No scleral icterus.  Neck: Normal range of motion.  Cardiovascular: Normal rate, regular rhythm and intact distal pulses.   DP and PT pulses 2+ b/l. Capillary refill normal.  Pulmonary/Chest: Effort normal. No respiratory distress.  Musculoskeletal: Normal range of motion.  B/l soft tissue swelling of ankles without erythema, red linear streaking, TTP, or heat-to-touch. No calf tenderness b/l. ROM normal.  Neurological: She is alert and oriented to person, place, and time.  No sensory or motor deficits appreciated. DTRs normal and symmetric.  Skin: Skin is warm and dry. No rash noted. She is not diaphoretic. No erythema. No pallor.  Psychiatric: She has a normal mood and affect. Her behavior is normal.    ED Course  Procedures (including critical care time) Labs Review Labs Reviewed  URINALYSIS, ROUTINE W REFLEX MICROSCOPIC - Abnormal; Notable for the following:    APPearance CLOUDY (*)    Leukocytes, UA SMALL (*)    All other components within normal limits  URINE MICROSCOPIC-ADD ON  POCT I-STAT, CHEM 8   Imaging Review No results found.  EKG Interpretation   None       MDM   1. Swelling of both ankles    Patient presents for swelling to her bilateral ankles x5 days. Symptoms are worsened with standing and improved with elevation. Patient denies any pain, erythema, pallor, or heat-to-touch. She is neurovascularly intact on physical exam as well as afebrile, hemodynamically stable, and well and nontoxic appearing. Chem-8 and urinalysis unremarkable. Do not believe symptoms  to be secondary to a blood clot given b/l nature of symptoms, lack of pain, heat, erythema, and pitting edema, as well as lack of calf tenderness and claudication. Patient appropriate for d/c with PCP follow up for further evaluation of her symptoms. Have recommended patient decrease PO intake of salt and processed foods. Have also advised she speak with her PCP about adding a diuretic should symptoms not improve with dietary changes and compression stockings. Return precautions discussed and patient agreeable to plan with no unaddressed concerns.    Antony Madura, PA-C 04/03/13 1519

## 2013-04-03 NOTE — ED Notes (Signed)
Pt. Stated, I've been having ankle swelling for about a week. No pain just swelling

## 2013-04-03 NOTE — ED Notes (Signed)
Report to Brittany, RN.

## 2013-04-04 NOTE — ED Provider Notes (Signed)
Medical screening examination/treatment/procedure(s) were performed by non-physician practitioner and as supervising physician I was immediately available for consultation/collaboration.  Mylan Schwarz T Candance Bohlman, MD 04/04/13 1543 

## 2013-05-31 ENCOUNTER — Encounter: Payer: Self-pay | Admitting: Dietician

## 2013-05-31 ENCOUNTER — Encounter: Payer: PRIVATE HEALTH INSURANCE | Attending: Internal Medicine | Admitting: Dietician

## 2013-05-31 VITALS — Wt 216.2 lb

## 2013-05-31 DIAGNOSIS — Z713 Dietary counseling and surveillance: Secondary | ICD-10-CM | POA: Insufficient documentation

## 2013-05-31 DIAGNOSIS — E119 Type 2 diabetes mellitus without complications: Secondary | ICD-10-CM | POA: Insufficient documentation

## 2013-05-31 DIAGNOSIS — I10 Essential (primary) hypertension: Secondary | ICD-10-CM

## 2013-05-31 DIAGNOSIS — F17200 Nicotine dependence, unspecified, uncomplicated: Secondary | ICD-10-CM | POA: Insufficient documentation

## 2013-05-31 NOTE — Progress Notes (Signed)
A: Pt reports altering meat and proteins to incorporate  B: bacon 2-3 strips or 2 sausage patties, 2 eggs with toast or grits S: none L: skips lunch most days S: none D: fried chix or baked chix, bread or a starch like mac and cheese with green beans. The other night she had a roast with potatoes, carrots, and small portion of mac and cheese. Slice of apple pie for dessert. Some nights she will have only a sandwich. She does remove all the skin of her chicken.   She has made some significant progress on reducing sodium content from canned meats, deli meats. She has also switched to [place] hot dogs now from beef franks.   Physical activity: only light housecleaning or occasional walking to store.  I: Pt refuses to make significant alterations to morning bacon or sausage, but has made good changes elsewhere, particularly stopping canned meat intake, including more nonstarchy veg, and she shows much more awareness of CHO foods. She also seems unwilling to eliminate hot dogs to reduce sodium, but she reports buying "less salty" [place] versions.   At this point, wt fluctuations are most likely issues with fluid retention, which are likely to be most improved from significant increases in activity level. Pt is somewhat resistant, but agreed to begin at 15 minutes walking a day and increase from there.   M/E: Monitor weight, BP, HgbA1c, portion control, exercise. F/U PRN.

## 2013-06-24 HISTORY — PX: TYMPANOMASTOIDECTOMY: SHX34

## 2013-09-22 ENCOUNTER — Encounter (HOSPITAL_COMMUNITY): Payer: Self-pay | Admitting: Pharmacist

## 2013-09-22 HISTORY — PX: CARDIAC CATHETERIZATION: SHX172

## 2013-10-05 ENCOUNTER — Ambulatory Visit (HOSPITAL_COMMUNITY)
Admission: RE | Admit: 2013-10-05 | Discharge: 2013-10-05 | Disposition: A | Discharge status: Home / Self Care | Payer: PRIVATE HEALTH INSURANCE | Source: Ambulatory Visit | Attending: Cardiology | Admitting: Cardiology

## 2013-10-05 ENCOUNTER — Encounter (HOSPITAL_COMMUNITY)
Admission: RE | Disposition: A | Discharge status: Home / Self Care | Payer: Self-pay | Source: Ambulatory Visit | Attending: Cardiology

## 2013-10-05 DIAGNOSIS — I70219 Atherosclerosis of native arteries of extremities with intermittent claudication, unspecified extremity: Secondary | ICD-10-CM | POA: Insufficient documentation

## 2013-10-05 DIAGNOSIS — Z87891 Personal history of nicotine dependence: Secondary | ICD-10-CM | POA: Insufficient documentation

## 2013-10-05 DIAGNOSIS — E785 Hyperlipidemia, unspecified: Secondary | ICD-10-CM | POA: Insufficient documentation

## 2013-10-05 DIAGNOSIS — E119 Type 2 diabetes mellitus without complications: Secondary | ICD-10-CM | POA: Insufficient documentation

## 2013-10-05 DIAGNOSIS — I10 Essential (primary) hypertension: Secondary | ICD-10-CM | POA: Insufficient documentation

## 2013-10-05 DIAGNOSIS — I739 Peripheral vascular disease, unspecified: Secondary | ICD-10-CM

## 2013-10-05 HISTORY — PX: LOWER EXTREMITY ANGIOGRAM: SHX5508

## 2013-10-05 LAB — GLUCOSE, CAPILLARY
Glucose-Capillary: 79 mg/dL (ref 70–99)
Glucose-Capillary: 83 mg/dL (ref 70–99)

## 2013-10-05 SURGERY — ANGIOGRAM, LOWER EXTREMITY
Anesthesia: LOCAL

## 2013-10-05 MED ORDER — HYDROMORPHONE HCL PF 1 MG/ML IJ SOLN
INTRAMUSCULAR | Status: AC
  Filled 2013-10-05: qty 1

## 2013-10-05 MED ORDER — METFORMIN HCL 500 MG PO TABS: 250.0000 mg | ORAL_TABLET | Freq: Two times a day (BID) | ORAL | Status: DC

## 2013-10-05 MED ORDER — MIDAZOLAM HCL 2 MG/2ML IJ SOLN
INTRAMUSCULAR | Status: AC
  Filled 2013-10-05: qty 2

## 2013-10-05 MED ORDER — LIDOCAINE HCL (PF) 1 % IJ SOLN
INTRAMUSCULAR | Status: AC
  Filled 2013-10-05: qty 30

## 2013-10-05 MED ORDER — SODIUM CHLORIDE 0.9 % IV BOLUS (SEPSIS)
500.0000 mL | Freq: Once | INTRAVENOUS | Status: AC
  Administered 2013-10-05: 06:00:00 via INTRAVENOUS

## 2013-10-05 MED ORDER — OXYCODONE-ACETAMINOPHEN 5-325 MG PO TABS: 1.0000 | ORAL_TABLET | ORAL | Status: DC | PRN

## 2013-10-05 MED ORDER — SODIUM CHLORIDE 0.9 % IV SOLN: INTRAVENOUS | Status: DC

## 2013-10-05 MED ORDER — SODIUM CHLORIDE 0.9 % IV SOLN: 1.0000 mL/kg/h | INTRAVENOUS | Status: DC

## 2013-10-05 MED ORDER — HEPARIN (PORCINE) IN NACL 2-0.9 UNIT/ML-% IJ SOLN
INTRAMUSCULAR | Status: AC
  Filled 2013-10-05: qty 1000

## 2013-10-05 NOTE — Progress Notes (Signed)
Pt found up OOB going to bathroom. Assisted pt back to bed, reeducated regarding bedrest and keeping left leg straight and still. Pt verbalized understanding. Left groin site stable.

## 2013-10-05 NOTE — Discharge Instructions (Signed)

## 2013-10-05 NOTE — H&P (Signed)
  Please see office visit notes for complete details of HPI.  

## 2013-10-05 NOTE — Interval H&P Note (Signed)
History and Physical Interval Note:  10/05/2013 7:41 AM  [name]  has presented today for surgery, with the diagnosis of claudication  The various methods of treatment have been discussed with the patient and family. After consideration of risks, benefits and other options for treatment, the patient has consented to  Procedure(s): LOWER EXTREMITY ANGIOGRAM (N/A) and possible PTA as a surgical intervention .  The patient's history has been reviewed, patient examined, no change in status, stable for surgery.  I have reviewed the patient's chart and labs.  Questions were answered to the patient's satisfaction.     Laverda Page

## 2013-10-05 NOTE — CV Procedure (Signed)
Procedures performed: Left Femoral access, Bilateral femoral arteriogram and crossover from left into the right femoral artery placement of catheter tip in the right femoral artery and femoral run-off. Left femoral arteriogram with distal runoff through the left femoral access.   Indication: Known PAD,  patient with known diabetes mellitus, hypertension and hyperlipidemia and prior tobacco use disorder, previously on 12/29/2012, she has had peripheral arteriogram and she has known right SFA occlusion which is amenable for percutaneous revascularization. However at that time she had GI bleed and severe anemia hence it was deferred. She is now been stabilized on medical therapy with no further GI bleed, progress in stable and hence was brought back to the peripheral angiography suite to reevaluate her peripheral anatomy for possible revascularization.   Bilateral femoral arteriogram: Pelvic aortogram was performed revealing distal abdominal showing significant atherosclerotic changes without high-grade stenosis. Aortoiliac bifurcation was patent, both the external and internal and common iliac artery showed diffuse disease but no high-grade stenosis.   Right femoral artery with distal runoff: The right SFA is occluded in the proximal segment, a large profunda femoral artery is a high-grade 80-90% stenosis. The profunda femoral artery gives extensive collaterals and the distal SFA reconstitutes above the knee. Below the knee there was two-vessel runoff in the form of peroneal and posterior tibial artery. Antecubital artery is occluded. Next  Left femoral artery with distal runoff: The left superficial femoral artery and common femoral artery shows mild diffuse disease without any high-grade stenosis. Below the knee there was two-vessel runoff in the form of peroneal and posterior tibial artery.  Recommendation: Patient has severe peripheral artery disease involving the right lower extremity, however the  anatomy is progressed with disease progression involving the ostium of the right SFA. The right profunda femoral artery has high-grade 90% stenosis. Patient will be at the very high risk for jeopardy of the right profunda femoral artery if angioplasty is attempted percutaneous. Hence at this point continued medical therapy is indicated, if she consists has significant claudication, then would consider surgical device position. Patient will discharge home today with outpatient followup.  TECHNIQUE OF  PROCEDURE: Under sterile precautions using a 5-French left femoral arterial  access, a 5 French pigtail catheter was advanced into the distal abdominal aorta and abdominal aortogram was performed. The same catheter was utilized to cross over from the left femoral artery the right femoral artery with the help of a Versacore wire, the catheter was then pulled out of the body and the change to a 5 [place] endhole catheter. Right femoral arteriogram distal runoff was performed. Then the cath was pulled out of the body. Using the left femoral arterial access sheath, left femoral arteriogram distal runoff was performed. Hemostasis was obtained by applying manual pressure. A total of 95 cc of contrast was utilized for diagnostic angiography.

## 2013-11-16 ENCOUNTER — Other Ambulatory Visit (HOSPITAL_COMMUNITY): Payer: Self-pay | Admitting: Internal Medicine

## 2013-11-16 DIAGNOSIS — Z1231 Encounter for screening mammogram for malignant neoplasm of breast: Secondary | ICD-10-CM

## 2013-11-23 ENCOUNTER — Emergency Department (HOSPITAL_COMMUNITY): Payer: PRIVATE HEALTH INSURANCE

## 2013-11-23 ENCOUNTER — Emergency Department (HOSPITAL_COMMUNITY)
Admission: EM | Admit: 2013-11-23 | Discharge: 2013-11-23 | Disposition: A | Discharge status: Home / Self Care | Payer: PRIVATE HEALTH INSURANCE | Attending: Emergency Medicine | Admitting: Emergency Medicine

## 2013-11-23 ENCOUNTER — Encounter (HOSPITAL_COMMUNITY): Payer: Self-pay | Admitting: Emergency Medicine

## 2013-11-23 DIAGNOSIS — R079 Chest pain, unspecified: Secondary | ICD-10-CM | POA: Insufficient documentation

## 2013-11-23 DIAGNOSIS — E785 Hyperlipidemia, unspecified: Secondary | ICD-10-CM | POA: Insufficient documentation

## 2013-11-23 DIAGNOSIS — I10 Essential (primary) hypertension: Secondary | ICD-10-CM | POA: Insufficient documentation

## 2013-11-23 DIAGNOSIS — R001 Bradycardia, unspecified: Secondary | ICD-10-CM

## 2013-11-23 DIAGNOSIS — R011 Cardiac murmur, unspecified: Secondary | ICD-10-CM | POA: Insufficient documentation

## 2013-11-23 DIAGNOSIS — J449 Chronic obstructive pulmonary disease, unspecified: Secondary | ICD-10-CM | POA: Insufficient documentation

## 2013-11-23 DIAGNOSIS — I498 Other specified cardiac arrhythmias: Secondary | ICD-10-CM | POA: Insufficient documentation

## 2013-11-23 DIAGNOSIS — F329 Major depressive disorder, single episode, unspecified: Secondary | ICD-10-CM | POA: Insufficient documentation

## 2013-11-23 DIAGNOSIS — E119 Type 2 diabetes mellitus without complications: Secondary | ICD-10-CM | POA: Insufficient documentation

## 2013-11-23 DIAGNOSIS — Z86718 Personal history of other venous thrombosis and embolism: Secondary | ICD-10-CM | POA: Insufficient documentation

## 2013-11-23 DIAGNOSIS — M129 Arthropathy, unspecified: Secondary | ICD-10-CM | POA: Insufficient documentation

## 2013-11-23 DIAGNOSIS — F17200 Nicotine dependence, unspecified, uncomplicated: Secondary | ICD-10-CM | POA: Insufficient documentation

## 2013-11-23 DIAGNOSIS — G8929 Other chronic pain: Secondary | ICD-10-CM | POA: Insufficient documentation

## 2013-11-23 DIAGNOSIS — F411 Generalized anxiety disorder: Secondary | ICD-10-CM | POA: Insufficient documentation

## 2013-11-23 DIAGNOSIS — K219 Gastro-esophageal reflux disease without esophagitis: Secondary | ICD-10-CM | POA: Insufficient documentation

## 2013-11-23 DIAGNOSIS — F3289 Other specified depressive episodes: Secondary | ICD-10-CM | POA: Insufficient documentation

## 2013-11-23 DIAGNOSIS — Z79899 Other long term (current) drug therapy: Secondary | ICD-10-CM | POA: Insufficient documentation

## 2013-11-23 DIAGNOSIS — Z7982 Long term (current) use of aspirin: Secondary | ICD-10-CM | POA: Insufficient documentation

## 2013-11-23 DIAGNOSIS — J4489 Other specified chronic obstructive pulmonary disease: Secondary | ICD-10-CM | POA: Insufficient documentation

## 2013-11-23 LAB — BASIC METABOLIC PANEL
BUN: 11 mg/dL (ref 6–23)
CALCIUM: 9.9 mg/dL (ref 8.4–10.5)
CHLORIDE: 100 meq/L (ref 96–112)
CO2: 27 mEq/L (ref 19–32)
CREATININE: 0.79 mg/dL (ref 0.50–1.10)
GFR calc non Af Amer: 87 mL/min — ABNORMAL LOW (ref >90)
Glucose, Bld: 103 mg/dL — ABNORMAL HIGH (ref 70–99)
Potassium: 3.8 mEq/L (ref 3.7–5.3)
Sodium: 139 mEq/L (ref 137–147)

## 2013-11-23 LAB — CBC
HEMATOCRIT: 43.3 % (ref 36.0–46.0)
Hemoglobin: 14 g/dL (ref 12.0–15.0)
MCH: 28.3 pg (ref 26.0–34.0)
MCHC: 32.3 g/dL (ref 30.0–36.0)
MCV: 87.7 fL (ref 78.0–100.0)
PLATELETS: 398 10*3/uL (ref 150–400)
RBC: 4.94 MIL/uL (ref 3.87–5.11)
RDW: 14.7 % (ref 11.5–15.5)
WBC: 6.6 10*3/uL (ref 4.0–10.5)

## 2013-11-23 LAB — I-STAT TROPONIN, ED: TROPONIN I, POC: 0 ng/mL (ref 0.00–0.08)

## 2013-11-23 MED ORDER — AMLODIPINE BESYLATE 10 MG PO TABS
10.0000 mg | ORAL_TABLET | Freq: Once | ORAL | Status: AC
  Administered 2013-11-23: 10 mg via ORAL
  Filled 2013-11-23: qty 1

## 2013-11-23 MED ORDER — AMLODIPINE BESYLATE 10 MG PO TABS: 10.0000 mg | ORAL_TABLET | Freq: Every day | ORAL | Status: DC

## 2013-11-23 NOTE — ED Notes (Addendum)
Per pt sts since yesterday she has been faint feeling and dizzy with HA. sts she checked her BP and it was 240/96. Pt also sts since last night her left arm and felt weak. sts some chest pains yesterday.

## 2013-11-23 NOTE — ED Notes (Signed)
Pt came here for htn that has now lowered on its own, pt reports that her heart rate is normally low. Currently in 40's on assessment.

## 2013-11-23 NOTE — ED Notes (Signed)
Pt ambualted to restroom.

## 2013-11-23 NOTE — Discharge Instructions (Signed)
Dr. Mila Palmer office will call you for a follow up appointment within 1 week.

## 2013-11-23 NOTE — ED Provider Notes (Signed)
CSN: [id number]     Arrival date & time 11/23/13  5277 History   First MD Initiated Contact with Patient 11/23/13 1113     Chief Complaint  Patient presents with  . Hypertension     (Consider location/radiation/quality/duration/timing/severity/associated sxs/prior Treatment) Patient is a 63 y.o. female presenting with hypertension. The history is provided by the patient (the pt states she had some dizziness and arm pain).  Hypertension This is a new problem. The current episode started 1 to 2 hours ago. The problem occurs rarely. The problem has been resolved. Associated symptoms include chest pain. Pertinent negatives include no abdominal pain and no headaches. Nothing aggravates the symptoms. Nothing relieves the symptoms.    Past Medical History  Diagnosis Date  . Hypertension   . Hyperlipemia   . Chronic pain   . Anxiety   . Depression   . GERD (gastroesophageal reflux disease)   . Diabetes mellitus without complication   . COPD (chronic obstructive pulmonary disease)   . Heart murmur   . Arthritis   . Deep vein blood clot of right lower extremity 12/29/2012    Dr. Nadyne Coombes, stent in place   Past Surgical History  Procedure Laterality Date  . Cesarean section    . Uterine fibroid surgery    . Hand surgery    . Abdominal hysterectomy    . Stent in right leg  12/29/2012    for blood clot, Dr. Nadyne Coombes  . Esophagogastroduodenoscopy N/A 01/08/2013    Procedure: ESOPHAGOGASTRODUODENOSCOPY (EGD);  Surgeon: Beryle Beams, MD;  Location: [facility];  Service: Endoscopy;  Laterality: N/A;  . Colonoscopy N/A 01/08/2013    Procedure: COLONOSCOPY;  Surgeon: Beryle Beams, MD;  Location: [facility];  Service: Endoscopy;  Laterality: N/A;   History reviewed. No pertinent family history. History  Substance Use Topics  . Smoking status: Current Every Day Smoker -- 0.50 packs/day for 41 years    Types: Cigarettes  . Smokeless tobacco: Never Used  . Alcohol Use: No   OB History   Grav  Para Term Preterm Abortions TAB SAB Ect Mult Living                 Review of Systems  Constitutional: Negative for appetite change and fatigue.  HENT: Negative for congestion, ear discharge and sinus pressure.   Eyes: Negative for discharge.  Respiratory: Negative for cough.   Cardiovascular: Positive for chest pain.  Gastrointestinal: Negative for abdominal pain and diarrhea.  Genitourinary: Negative for frequency and hematuria.  Musculoskeletal: Negative for back pain.  Skin: Negative for rash.  Neurological: Negative for seizures and headaches.  Psychiatric/Behavioral: Negative for hallucinations.      Allergies  Review of patient's allergies indicates no known allergies.  Home Medications   Prior to Admission medications   Medication Sig Start Date End Date Taking? Authorizing Provider  albuterol (PROVENTIL HFA;VENTOLIN HFA) 108 (90 BASE) MCG/ACT inhaler Inhale 2 puffs into the lungs every 6 (six) hours as needed for wheezing. For shortness of breath   Yes Historical Provider, MD  Ascorbic Acid (VITAMIN C PO) Take 1 tablet by mouth daily as needed (Energy). No specific days of the week.   Yes Historical Provider, MD  aspirin 81 MG chewable tablet Chew 81 mg by mouth daily.   Yes Historical Provider, MD  atorvastatin (LIPITOR) 40 MG tablet Take 40 mg by mouth every morning.   Yes Historical Provider, MD  buPROPion (WELLBUTRIN SR) 150 MG 12 hr tablet Take 150 mg by  mouth 2 (two) times daily.   Yes Historical Provider, MD  cholecalciferol (VITAMIN D) 1000 UNITS tablet Take 1,000 Units by mouth daily.   Yes Historical Provider, MD  DULoxetine (CYMBALTA) 30 MG capsule Take 30 mg by mouth daily.  09/14/13  Yes Historical Provider, MD  furosemide (LASIX) 40 MG tablet Take 40 mg by mouth daily.  11/16/13  Yes Historical Provider, MD  gabapentin (NEURONTIN) 300 MG capsule Take 300 mg by mouth at bedtime.  11/16/13  Yes Historical Provider, MD  lisinopril-hydrochlorothiazide  (PRINZIDE,ZESTORETIC) 20-12.5 MG per tablet Take 1 tablet by mouth daily.  11/13/13  Yes Historical Provider, MD  metFORMIN (GLUCOPHAGE) 500 MG tablet Take 0.5 tablets (250 mg total) by mouth 2 (two) times daily with a meal. 10/07/13  Yes Laverda Page, MD  metoprolol succinate (TOPROL-XL) 25 MG 24 hr tablet Take 25 mg by mouth daily.   Yes Historical Provider, MD  nitroGLYCERIN (NITROSTAT) 0.4 MG SL tablet Place 0.4 mg under the tongue every 5 (five) minutes as needed for chest pain.   Yes Historical Provider, MD  pravastatin (PRAVACHOL) 20 MG tablet Take 20 mg by mouth daily.  11/16/13  Yes Historical Provider, MD  valsartan (DIOVAN) 320 MG tablet Take 320 mg by mouth daily.   Yes Historical Provider, MD  amLODipine (NORVASC) 10 MG tablet Take 1 tablet (10 mg total) by mouth daily. 11/23/13   Maudry Diego, MD   BP 197/75  Pulse 50  Temp(Src) 98.2 F (36.8 C)  Resp 22  Ht 4\' 11"  (1.499 m)  Wt 209 lb (94.802 kg)  BMI 42.19 kg/m2  SpO2 99% Physical Exam  Constitutional: She is oriented to person, place, and time. She appears well-developed.  HENT:  Head: Normocephalic.  Eyes: Conjunctivae and EOM are normal. No scleral icterus.  Neck: Neck supple. No thyromegaly present.  Cardiovascular: Normal rate and regular rhythm.  Exam reveals no gallop and no friction rub.   No murmur heard. Pulmonary/Chest: No stridor. She has no wheezes. She has no rales. She exhibits no tenderness.  Abdominal: She exhibits no distension. There is no tenderness. There is no rebound.  Musculoskeletal: Normal range of motion. She exhibits no edema.  Lymphadenopathy:    She has no cervical adenopathy.  Neurological: She is oriented to person, place, and time. She exhibits normal muscle tone. Coordination normal.  Skin: No rash noted. No erythema.  Psychiatric: She has a normal mood and affect. Her behavior is normal.    ED Course  Procedures (including critical care time) Labs Review Labs Reviewed  BASIC  METABOLIC PANEL - Abnormal; Notable for the following:    Glucose, Bld 103 (*)    GFR calc non Af Amer 87 (*)    All other components within normal limits  CBC  I-STAT [place], ED    Imaging Review Dg Chest 2 View  11/23/2013   CLINICAL DATA:  Hypertension  EXAM: CHEST  2 VIEW  COMPARISON:  05/11/2012  FINDINGS: The heart size and mediastinal contours are within normal limits. Both lungs are clear. Minimal scarring is again noted in the left base. The visualized skeletal structures are unremarkable.  IMPRESSION: No active cardiopulmonary disease.   Electronically Signed   By: Inez Catalina M.D.   On: 11/23/2013 10:55     EKG Interpretation   Date/Time:  Tuesday November 23 2013 09:54:13 EDT Ventricular Rate:  45 PR Interval:  162 QRS Duration: 84 QT Interval:  468 QTC Calculation: 404 R Axis:  60 Text Interpretation:  Marked sinus bradycardia with marked sinus  arrhythmia Abnormal ECG Confirmed by Monseratt Ledin  MD, Christine Morton [phone]) on  11/23/2013 1:58:48 PM    I spoke with Dr. Nadyne Coombes and we will start 10mg  of norvasc and follow up in office  MDM   Final diagnoses:  HTN (hypertension)  Bradycardia        Maudry Diego, MD 11/23/13 1400

## 2013-11-29 ENCOUNTER — Emergency Department (HOSPITAL_COMMUNITY): Payer: PRIVATE HEALTH INSURANCE

## 2013-11-29 ENCOUNTER — Emergency Department (HOSPITAL_COMMUNITY)
Admission: EM | Admit: 2013-11-29 | Discharge: 2013-11-29 | Disposition: A | Discharge status: Home / Self Care | Payer: PRIVATE HEALTH INSURANCE | Attending: Emergency Medicine | Admitting: Emergency Medicine

## 2013-11-29 ENCOUNTER — Encounter (HOSPITAL_COMMUNITY): Payer: Self-pay | Admitting: Emergency Medicine

## 2013-11-29 ENCOUNTER — Ambulatory Visit (HOSPITAL_COMMUNITY)
Admission: RE | Admit: 2013-11-29 | Discharge: 2013-11-29 | Disposition: A | Discharge status: Home / Self Care | Payer: PRIVATE HEALTH INSURANCE | Source: Ambulatory Visit | Attending: Emergency Medicine | Admitting: Emergency Medicine

## 2013-11-29 DIAGNOSIS — J441 Chronic obstructive pulmonary disease with (acute) exacerbation: Secondary | ICD-10-CM | POA: Insufficient documentation

## 2013-11-29 DIAGNOSIS — F3289 Other specified depressive episodes: Secondary | ICD-10-CM | POA: Diagnosis not present

## 2013-11-29 DIAGNOSIS — F329 Major depressive disorder, single episode, unspecified: Secondary | ICD-10-CM | POA: Insufficient documentation

## 2013-11-29 DIAGNOSIS — R011 Cardiac murmur, unspecified: Secondary | ICD-10-CM | POA: Insufficient documentation

## 2013-11-29 DIAGNOSIS — Z1231 Encounter for screening mammogram for malignant neoplasm of breast: Secondary | ICD-10-CM

## 2013-11-29 DIAGNOSIS — R5381 Other malaise: Secondary | ICD-10-CM | POA: Insufficient documentation

## 2013-11-29 DIAGNOSIS — G8929 Other chronic pain: Secondary | ICD-10-CM | POA: Insufficient documentation

## 2013-11-29 DIAGNOSIS — F411 Generalized anxiety disorder: Secondary | ICD-10-CM | POA: Diagnosis not present

## 2013-11-29 DIAGNOSIS — Z79899 Other long term (current) drug therapy: Secondary | ICD-10-CM | POA: Diagnosis not present

## 2013-11-29 DIAGNOSIS — E785 Hyperlipidemia, unspecified: Secondary | ICD-10-CM | POA: Diagnosis not present

## 2013-11-29 DIAGNOSIS — E119 Type 2 diabetes mellitus without complications: Secondary | ICD-10-CM | POA: Insufficient documentation

## 2013-11-29 DIAGNOSIS — R42 Dizziness and giddiness: Secondary | ICD-10-CM | POA: Insufficient documentation

## 2013-11-29 DIAGNOSIS — M129 Arthropathy, unspecified: Secondary | ICD-10-CM | POA: Diagnosis not present

## 2013-11-29 DIAGNOSIS — Z8719 Personal history of other diseases of the digestive system: Secondary | ICD-10-CM | POA: Diagnosis not present

## 2013-11-29 DIAGNOSIS — I10 Essential (primary) hypertension: Secondary | ICD-10-CM | POA: Insufficient documentation

## 2013-11-29 DIAGNOSIS — Z7982 Long term (current) use of aspirin: Secondary | ICD-10-CM | POA: Insufficient documentation

## 2013-11-29 DIAGNOSIS — Z86718 Personal history of other venous thrombosis and embolism: Secondary | ICD-10-CM | POA: Diagnosis not present

## 2013-11-29 DIAGNOSIS — F17200 Nicotine dependence, unspecified, uncomplicated: Secondary | ICD-10-CM | POA: Insufficient documentation

## 2013-11-29 DIAGNOSIS — I498 Other specified cardiac arrhythmias: Secondary | ICD-10-CM | POA: Diagnosis not present

## 2013-11-29 DIAGNOSIS — R5383 Other fatigue: Secondary | ICD-10-CM

## 2013-11-29 DIAGNOSIS — R0602 Shortness of breath: Secondary | ICD-10-CM

## 2013-11-29 LAB — I-STAT TROPONIN, ED
TROPONIN I, POC: 0 ng/mL (ref 0.00–0.08)
Troponin i, poc: 0 ng/mL (ref 0.00–0.08)

## 2013-11-29 LAB — BASIC METABOLIC PANEL
BUN: 13 mg/dL (ref 6–23)
CALCIUM: 9.8 mg/dL (ref 8.4–10.5)
CO2: 26 mEq/L (ref 19–32)
Chloride: 103 mEq/L (ref 96–112)
Creatinine, Ser: 0.65 mg/dL (ref 0.50–1.10)
GFR calc non Af Amer: >90 mL/min (ref >90)
Glucose, Bld: 90 mg/dL (ref 70–99)
Potassium: 4 mEq/L (ref 3.7–5.3)
Sodium: 141 mEq/L (ref 137–147)

## 2013-11-29 LAB — CBC
HCT: 43.1 % (ref 36.0–46.0)
Hemoglobin: 13.7 g/dL (ref 12.0–15.0)
MCH: 28.4 pg (ref 26.0–34.0)
MCHC: 31.8 g/dL (ref 30.0–36.0)
MCV: 89.2 fL (ref 78.0–100.0)
PLATELETS: 367 10*3/uL (ref 150–400)
RBC: 4.83 MIL/uL (ref 3.87–5.11)
RDW: 14.9 % (ref 11.5–15.5)
WBC: 6.9 10*3/uL (ref 4.0–10.5)

## 2013-11-29 NOTE — ED Provider Notes (Signed)
CSN: [id number]     Arrival date & time 11/29/13  1257 History   First MD Initiated Contact with Patient 11/29/13 1704     Chief Complaint  Patient presents with  . Dizziness  . Shortness of Breath     (Consider location/radiation/quality/duration/timing/severity/associated sxs/prior Treatment) Patient is a 63 y.o. female presenting with general illness. The history is provided by the patient.  Illness Severity:  Moderate Onset quality:  Sudden Timing:  Intermittent Progression:  Unchanged Chronicity:  New Associated symptoms: fatigue and shortness of breath   Associated symptoms: no abdominal pain, no chest pain, no congestion, no cough, no diarrhea, no fever, no headaches, no loss of consciousness, no nausea, no rhinorrhea and no vomiting     63 yo F pw LH sensation. Intermittently past 2-3 days. Today occurred while walking. Became light headed. No headache. Denies any nausea to me (although nursing note documents nausea). No vision change. Felt mildly sob. No chest pain or palpitations. No abd pain. Has had this in the mornings occasionally when she wakes up.  Was in ED ~1 week ago per patient. At that time presented 2/2 high blood pressure. States couple months ago was on multiple medications including 3 BP meds. Went to PCP who reportedly took off a few meds. Monotherapy for BP. After last ED visit, started on another BP med (norvasc 10mg ). Checked BP today and still 190's at home.  Did endorse some numbness/fatigue to left arm (denies actual weakness though).     Past Medical History  Diagnosis Date  . Hypertension   . Hyperlipemia   . Chronic pain   . Anxiety   . Depression   . GERD (gastroesophageal reflux disease)   . Diabetes mellitus without complication   . COPD (chronic obstructive pulmonary disease)   . Heart murmur   . Arthritis   . Deep vein blood clot of right lower extremity 12/29/2012    Dr. Nadyne Coombes, stent in place   Past Surgical History  Procedure  Laterality Date  . Cesarean section    . Uterine fibroid surgery    . Hand surgery    . Abdominal hysterectomy    . Stent in right leg  12/29/2012    for blood clot, Dr. Nadyne Coombes  . Esophagogastroduodenoscopy N/A 01/08/2013    Procedure: ESOPHAGOGASTRODUODENOSCOPY (EGD);  Surgeon: Beryle Beams, MD;  Location: [facility];  Service: Endoscopy;  Laterality: N/A;  . Colonoscopy N/A 01/08/2013    Procedure: COLONOSCOPY;  Surgeon: Beryle Beams, MD;  Location: [facility];  Service: Endoscopy;  Laterality: N/A;   No family history on file. History  Substance Use Topics  . Smoking status: Current Every Day Smoker -- 0.50 packs/day for 41 years    Types: Cigarettes  . Smokeless tobacco: Never Used  . Alcohol Use: No   OB History   Grav Para Term Preterm Abortions TAB SAB Ect Mult Living                 Review of Systems  Constitutional: Positive for fatigue. Negative for fever and chills.  HENT: Negative for congestion and rhinorrhea.   Eyes: Negative for visual disturbance.  Respiratory: Positive for shortness of breath. Negative for cough.   Cardiovascular: Negative for chest pain, palpitations and leg swelling.  Gastrointestinal: Negative for nausea, vomiting, abdominal pain and diarrhea.  Genitourinary: Negative for dysuria, hematuria, flank pain and difficulty urinating.  Musculoskeletal: Negative for back pain.  Neurological: Positive for dizziness and light-headedness. Negative for loss of consciousness  and headaches.  All other systems reviewed and are negative.     Allergies  Review of patient's allergies indicates no known allergies.  Home Medications   Prior to Admission medications   Medication Sig Start Date End Date Taking? Authorizing Provider  albuterol (PROVENTIL HFA;VENTOLIN HFA) 108 (90 BASE) MCG/ACT inhaler Inhale 2 puffs into the lungs every 6 (six) hours as needed for wheezing. For shortness of breath   Yes Historical Provider, MD  amLODipine (NORVASC) 10  MG tablet Take 1 tablet (10 mg total) by mouth daily. 11/23/13  Yes Maudry Diego, MD  Ascorbic Acid (VITAMIN C PO) Take 1 tablet by mouth daily as needed (Energy). No specific days of the week.   Yes Historical Provider, MD  aspirin 81 MG chewable tablet Chew 81 mg by mouth daily.   Yes Historical Provider, MD  atorvastatin (LIPITOR) 40 MG tablet Take 40 mg by mouth every morning.   Yes Historical Provider, MD  cholecalciferol (VITAMIN D) 1000 UNITS tablet Take 1,000 Units by mouth daily.   Yes Historical Provider, MD  furosemide (LASIX) 40 MG tablet Take 40 mg by mouth daily.  11/16/13  Yes Historical Provider, MD  gabapentin (NEURONTIN) 300 MG capsule Take 300 mg by mouth at bedtime.  11/16/13  Yes Historical Provider, MD  lisinopril-hydrochlorothiazide (PRINZIDE,ZESTORETIC) 20-12.5 MG per tablet Take 1 tablet by mouth daily.  11/13/13  Yes Historical Provider, MD  metFORMIN (GLUCOPHAGE) 500 MG tablet Take 0.5 tablets (250 mg total) by mouth 2 (two) times daily with a meal. 10/07/13  Yes Laverda Page, MD  metoprolol succinate (TOPROL-XL) 25 MG 24 hr tablet Take 25 mg by mouth daily.   Yes Historical Provider, MD  nitroGLYCERIN (NITROSTAT) 0.4 MG SL tablet Place 0.4 mg under the tongue every 5 (five) minutes as needed for chest pain.   Yes Historical Provider, MD  pravastatin (PRAVACHOL) 20 MG tablet Take 20 mg by mouth daily.  11/16/13  Yes Historical Provider, MD  valsartan (DIOVAN) 320 MG tablet Take 320 mg by mouth daily.   Yes Historical Provider, MD   BP 184/89  Pulse 47  Temp(Src) 98 F (36.7 C) (Oral)  Resp 21  Wt 209 lb (94.802 kg)  SpO2 95% Physical Exam  Nursing note and vitals reviewed. Constitutional: She is oriented to person, place, and time. She appears well-developed and well-nourished. No distress.  HENT:  Head: Normocephalic and atraumatic.  Eyes: Conjunctivae and EOM are normal. Pupils are equal, round, and reactive to light. Right eye exhibits no discharge. Left eye  exhibits no discharge.  Neck: Normal range of motion. Neck supple. No tracheal deviation present.  Minimal discomfort on palpation to b/l neck. Not focal over vasculature.   Cardiovascular: Regular rhythm, normal heart sounds and intact distal pulses.   Bradycardic to high 50's.  Pulmonary/Chest: Effort normal and breath sounds normal. No stridor. No respiratory distress. She has no wheezes. She has no rales.  Abdominal: Soft. She exhibits no distension. There is no tenderness. There is no guarding.  Musculoskeletal: She exhibits no edema and no tenderness.  Neurological: She is alert and oriented to person, place, and time. She has normal strength. No cranial nerve deficit or sensory deficit. Coordination normal. GCS eye subscore is 4. GCS verbal subscore is 5. GCS motor subscore is 6.  No drift. Intact finger to nose.   Skin: Skin is warm and dry.  Psychiatric: She has a normal mood and affect. Her behavior is normal.    ED Course  Procedures (including critical care time) Labs Review Labs Reviewed  BASIC METABOLIC PANEL  CBC  I-STAT [place], ED  [facility] Id[facility], ED    Imaging Review Dg Chest 2 View  11/29/2013   CLINICAL DATA:  63 year old female with dizziness and shortness of Breath. Initial encounter.  EXAM: CHEST  2 VIEW  COMPARISON:  11/23/2013 and earlier.  FINDINGS: Stable lung volumes with unchanged curvilinear scarring or atelectasis at the bases, more so the left. Mild cardiomegaly is stable. Other mediastinal contours are within normal limits. Visualized tracheal air column is within normal limits. No pneumothorax or pulmonary edema. No pleural effusion or confluent pulmonary opacity. No acute osseous abnormality identified. Flowing osteophytes in the spine.  IMPRESSION: No acute cardiopulmonary abnormality.  Mild cardiomegaly and chronic bibasilar scarring or atelectasis.   Electronically Signed   By: Lars Pinks M.D.   On: 11/29/2013 14:23     EKG  Interpretation   Date/Time:  Monday November 29 2013 13:41:44 EDT Ventricular Rate:  52 PR Interval:  166 QRS Duration: 88 QT Interval:  434 QTC Calculation: 403 R Axis:   22 Text Interpretation:  Sinus bradycardia Otherwise normal ECG Confirmed by  PICKERING  MD, Ovid Curd [phone]) on 11/29/2013 5:40:37 PM      MDM   Final diagnoses:  Hypertension  Light headed  SOB (shortness of breath)    LH sensation. Mild sob.  EKG reassuring. troponins negative x2. Bradycardia present chronically.  BP improved on own to 982'M systolic.  Labs and cxr without emergent findings.  Without fluid overload clinically or per history. Do not suspect heart failure.  Patient remains well appearing. Sitting up in bed. Speaks in full sentences. NAD. Doubt ACS, pna, ptx, dissection, PE or other emergent pathology. Do not believe further labs or imaging indicated at this time.   Patient discharged home. Return precautions given. To follow up with pcp. patient in agreement with plan.  Labs and imaging reviewed by myself and considered in medical decision making if ordered. Imaging interpreted by radiology.   Discussed case with Dr. [name] who is in agreement with assessment and plan.       Bonnita Hollow, MD 11/30/13 [phone]

## 2013-11-29 NOTE — Discharge Instructions (Signed)
Hypertension As your heart beats, it forces blood through your arteries. This force is your blood pressure. If the pressure is too high, it is called hypertension (HTN) or high blood pressure. HTN is dangerous because you may have it and not know it. High blood pressure may mean that your heart has to work harder to pump blood. Your arteries may be narrow or stiff. The extra work puts you at risk for heart disease, stroke, and other problems.  Blood pressure consists of two numbers, a higher number over a lower, 110/72, for example. It is stated as "110 over 72." The ideal is below 120 for the top number (systolic) and under 80 for the bottom (diastolic). Write down your blood pressure today. You should pay close attention to your blood pressure if you have certain conditions such as:  Heart failure.  Prior heart attack.  Diabetes  Chronic kidney disease.  Prior stroke.  Multiple risk factors for heart disease. To see if you have HTN, your blood pressure should be measured while you are seated with your arm held at the level of the heart. It should be measured at least twice. A one-time elevated blood pressure reading (especially in the Emergency Department) does not mean that you need treatment. There may be conditions in which the blood pressure is different between your right and left arms. It is important to see your caregiver soon for a recheck. Most people have essential hypertension which means that there is not a specific cause. This type of high blood pressure may be lowered by changing lifestyle factors such as:  Stress.  Smoking.  Lack of exercise.  Excessive weight.  Drug/tobacco/alcohol use.  Eating less salt. Most people do not have symptoms from high blood pressure until it has caused damage to the body. Effective treatment can often prevent, delay or reduce that damage. TREATMENT  When a cause has been identified, treatment for high blood pressure is directed at the  cause. There are a large number of medications to treat HTN. These fall into several categories, and your caregiver will help you select the medicines that are best for you. Medications may have side effects. You should review side effects with your caregiver. If your blood pressure stays high after you have made lifestyle changes or started on medicines,   Your medication(s) may need to be changed.  Other problems may need to be addressed.  Be certain you understand your prescriptions, and know how and when to take your medicine.  Be sure to follow up with your caregiver within the time frame advised (usually within two weeks) to have your blood pressure rechecked and to review your medications.  If you are taking more than one medicine to lower your blood pressure, make sure you know how and at what times they should be taken. Taking two medicines at the same time can result in blood pressure that is too low. SEEK IMMEDIATE MEDICAL CARE IF:  You develop a severe headache, blurred or changing vision, or confusion.  You have unusual weakness or numbness, or a faint feeling.  You have severe chest or abdominal pain, vomiting, or breathing problems. MAKE SURE YOU:   Understand these instructions.  Will watch your condition.  Will get help right away if you are not doing well or get worse. Document Released: 06/10/2005 Document Revised: 09/02/2011 Document Reviewed: 01/29/2008 [facility] Patient Information 2014 [place].    Shortness of Breath Shortness of breath means you have trouble breathing. Shortness of  breath may indicate that you have a medical problem. You should seek immediate medical care for shortness of breath. CAUSES   Not enough oxygen in the air (as with high altitudes or a smoke-filled room).  Short-term (acute) lung disease, including:  Infections, such as pneumonia.  Fluid in the lungs, such as heart failure.  A blood clot in the lungs (pulmonary  embolism).  Long-term (chronic) lung diseases.  Heart disease (heart attack, angina, heart failure, and others).  Low red blood cells (anemia).  Poor physical fitness. This can cause shortness of breath when you exercise.  Chest or back injuries or stiffness.  Being overweight.  Smoking.  Anxiety. This can make you feel like you are not getting enough air. DIAGNOSIS  Serious medical problems can usually be found during your physical exam. Tests may also be done to determine why you are having shortness of breath. Tests may include:  Chest X-rays.  Lung function tests.  Blood tests.  Electrocardiography.  Exercise testing.  Echocardiography.  Imaging scans. Your caregiver may not be able to find a cause for your shortness of breath after your exam. In this case, it is important to have a follow-up exam with your caregiver as directed.  TREATMENT  Treatment for shortness of breath depends on the cause of your symptoms and can vary greatly. HOME CARE INSTRUCTIONS   Do not smoke. Smoking is a common cause of shortness of breath. If you smoke, ask for help to quit.  Avoid being around chemicals or things that may bother your breathing, such as paint fumes and dust.  Rest as needed. Slowly resume your usual activities.  If medicines were prescribed, take them as directed for the full length of time directed. This includes oxygen and any inhaled medicines.  Keep all follow-up appointments as directed by your caregiver. SEEK MEDICAL CARE IF:   Your condition does not improve in the time expected.  You have a hard time doing your normal activities even with rest.  You have any side effects or problems with the medicines prescribed.  You develop any new symptoms. SEEK IMMEDIATE MEDICAL CARE IF:   Your shortness of breath gets worse.  You feel lightheaded, faint, or develop a cough not controlled with medicines.  You start coughing up blood.  You have pain with  breathing.  You have chest pain or pain in your arms, shoulders, or abdomen.  You have a fever.  You are unable to walk up stairs or exercise the way you normally do. MAKE SURE YOU:  Understand these instructions.  Will watch your condition.  Will get help right away if you are not doing well or get worse. Document Released: 03/05/2001 Document Revised: 12/10/2011 Document Reviewed: 08/26/2011 [facility] Patient Information 2014 [place].

## 2013-11-29 NOTE — ED Notes (Signed)
Pt st's she has felt dizzy when she wakes up in the am and continues to feel dizzy through out the day.  Pt alert and oriented x's 3.  Skin warm and dry, color appropriate.  Pt st's her B/P was elevated this am so her daughter told her to come to ED

## 2013-11-29 NOTE — ED Notes (Signed)
Patient reports she has been dizzy for the past 3 days.  She states when she wakes up she has noted dizziness.  She also reports cramping sensation in her neck.  She also reports sob.  She denies chest pain.  She also reports nausea.

## 2013-11-30 NOTE — ED Provider Notes (Signed)
I saw and evaluated the patient, reviewed the resident's note and I agree with the findings and plan.   EKG Interpretation   Date/Time:  Monday November 29 2013 13:41:44 EDT Ventricular Rate:  52 PR Interval:  166 QRS Duration: 88 QT Interval:  434 QTC Calculation: 403 R Axis:   22 Text Interpretation:  Sinus bradycardia Otherwise normal ECG Confirmed by  [name]  MD, Ovid Curd [phone]) on 11/29/2013 5:40:37 PM     Patient with dizziness and feeling bad. Patient has had her medications changed recently since she stopped taking some on her own and then restarted. She also has recently, around the same time as the symptoms started, stopped her Wellbutrin. This could also be contributing to her symptoms. Patient be discharged  [name]. [name], MD 11/30/13 1515

## 2013-12-08 ENCOUNTER — Ambulatory Visit (INDEPENDENT_AMBULATORY_CARE_PROVIDER_SITE_OTHER): Payer: PRIVATE HEALTH INSURANCE | Admitting: Podiatry

## 2013-12-08 ENCOUNTER — Encounter: Payer: Self-pay | Admitting: Podiatry

## 2013-12-08 ENCOUNTER — Ambulatory Visit (INDEPENDENT_AMBULATORY_CARE_PROVIDER_SITE_OTHER): Payer: PRIVATE HEALTH INSURANCE

## 2013-12-08 VITALS — BP 167/78 | HR 57 | Resp 17 | Ht 59.0 in | Wt 203.0 lb

## 2013-12-08 DIAGNOSIS — M779 Enthesopathy, unspecified: Secondary | ICD-10-CM

## 2013-12-08 DIAGNOSIS — R52 Pain, unspecified: Secondary | ICD-10-CM

## 2013-12-08 NOTE — Patient Instructions (Signed)
When walking wearing new under 1 year walking or running shoes. Return if pain on the right foot increases over time

## 2013-12-08 NOTE — Progress Notes (Signed)
   Subjective:    Patient ID: [name], female    DOB: February 25, 1951, 63 y.o.   MRN: [id number]  HPI Comments: N foot pain L right lateral plantar D 1 month O  C sharp, sticking pain A barefooted T none  Foot Pain   This patient denies any direct injury to the foot. The symptoms are somewhat intermittent on and off weightbearing. She says that she walks 20 minutes 2 times a week for the last several months She also states that she has a pending vascular examination for her known peripheral arterial disease with Dr.Ganji in the next week    Review of Systems  All other systems reviewed and are negative.      Objective:   Physical Exam  Orientated x3 black female   Vascular: DP 0/4 bilaterally PT 0/4 bilaterally  Neurological: Sensation to 10 g monofilament wire intact 5/5 bilaterally Ankle reflexes equal and reactive bilaterally Vibratory sensation intact bilaterally  Dermatological: Incurvated, discolored, hypertrophic toenails x10 Skin texture turgor within normal limits No skin lesions are noted bilaterally  Musculoskeletal: There is mild palpable tenderness base of fifth right metatarsal cuboid area without a palpable lesions. This is a general area of patient's discomfort There is no restriction ankle, midtarsal, metatarsal phalangeal joints bilaterally  X-ray report right foot weightbearing  Intact bony structure without fracture and/or dislocation  HAV deformity  Posterior and inferior calcaneal spurs  Radiographic impression:  No acute bony abnormality noted       Assessment & Plan:   Assessment: Peripheral Arterial disease bilaterally under evaluation and treatment with Dr.Ganji Sprain lateral right foot  Plan: Patient advised to wear a new walking or running shoe when she walks. She will continue to monitor foot pain and return if symptoms are worsening over time She will keep scheduled appointment with Dr.Ganji   Reappoint when  necessary or at yearly intervals

## 2013-12-09 ENCOUNTER — Encounter: Payer: Self-pay | Admitting: Podiatry

## 2013-12-13 DIAGNOSIS — Z1231 Encounter for screening mammogram for malignant neoplasm of breast: Secondary | ICD-10-CM | POA: Insufficient documentation

## 2014-01-25 ENCOUNTER — Other Ambulatory Visit (INDEPENDENT_AMBULATORY_CARE_PROVIDER_SITE_OTHER): Payer: Self-pay | Admitting: Otolaryngology

## 2014-01-25 DIAGNOSIS — R42 Dizziness and giddiness: Secondary | ICD-10-CM

## 2014-01-25 DIAGNOSIS — H918X9 Other specified hearing loss, unspecified ear: Secondary | ICD-10-CM

## 2014-02-02 ENCOUNTER — Ambulatory Visit
Admission: RE | Admit: 2014-02-02 | Discharge: 2014-02-02 | Disposition: A | Discharge status: Home / Self Care | Payer: PRIVATE HEALTH INSURANCE | Source: Ambulatory Visit | Attending: Otolaryngology | Admitting: Otolaryngology

## 2014-02-02 DIAGNOSIS — H918X3 Other specified hearing loss, bilateral: Secondary | ICD-10-CM

## 2014-02-02 DIAGNOSIS — R42 Dizziness and giddiness: Secondary | ICD-10-CM

## 2014-02-02 DIAGNOSIS — H918X9 Other specified hearing loss, unspecified ear: Secondary | ICD-10-CM

## 2014-02-02 MED ORDER — GADOBENATE DIMEGLUMINE 529 MG/ML IV SOLN
20.0000 mL | Freq: Once | INTRAVENOUS | Status: AC | PRN
  Administered 2014-02-02: 20 mL via INTRAVENOUS

## 2014-02-04 ENCOUNTER — Other Ambulatory Visit (INDEPENDENT_AMBULATORY_CARE_PROVIDER_SITE_OTHER): Payer: Self-pay | Admitting: Otolaryngology

## 2014-02-04 DIAGNOSIS — H7192 Unspecified cholesteatoma, left ear: Secondary | ICD-10-CM

## 2014-02-08 ENCOUNTER — Ambulatory Visit
Admission: RE | Admit: 2014-02-08 | Discharge: 2014-02-08 | Disposition: A | Discharge status: Home / Self Care | Payer: PRIVATE HEALTH INSURANCE | Source: Ambulatory Visit | Attending: Otolaryngology | Admitting: Otolaryngology

## 2014-02-08 DIAGNOSIS — H7192 Unspecified cholesteatoma, left ear: Secondary | ICD-10-CM

## 2014-02-16 ENCOUNTER — Other Ambulatory Visit: Payer: Self-pay | Admitting: Otolaryngology

## 2014-03-03 ENCOUNTER — Encounter (HOSPITAL_BASED_OUTPATIENT_CLINIC_OR_DEPARTMENT_OTHER): Payer: Self-pay | Admitting: *Deleted

## 2014-03-03 NOTE — Progress Notes (Signed)
To come in for bmet-says dr Einar Gip was not able to do cath or leg stent-notes in about having done-will get recent notes-bring all meds cpap amd overnight bag

## 2014-03-08 ENCOUNTER — Encounter (HOSPITAL_BASED_OUTPATIENT_CLINIC_OR_DEPARTMENT_OTHER): Payer: Self-pay | Admitting: *Deleted

## 2014-03-08 ENCOUNTER — Encounter (HOSPITAL_BASED_OUTPATIENT_CLINIC_OR_DEPARTMENT_OTHER): Payer: PRIVATE HEALTH INSURANCE | Admitting: Anesthesiology

## 2014-03-08 ENCOUNTER — Ambulatory Visit (HOSPITAL_BASED_OUTPATIENT_CLINIC_OR_DEPARTMENT_OTHER): Payer: PRIVATE HEALTH INSURANCE | Admitting: Anesthesiology

## 2014-03-08 ENCOUNTER — Encounter (HOSPITAL_BASED_OUTPATIENT_CLINIC_OR_DEPARTMENT_OTHER)
Admission: RE | Disposition: A | Discharge status: Home / Self Care | Payer: Self-pay | Source: Ambulatory Visit | Attending: Otolaryngology

## 2014-03-08 ENCOUNTER — Ambulatory Visit (HOSPITAL_BASED_OUTPATIENT_CLINIC_OR_DEPARTMENT_OTHER)
Admission: RE | Admit: 2014-03-08 | Discharge: 2014-03-08 | Disposition: A | Discharge status: Home / Self Care | Payer: PRIVATE HEALTH INSURANCE | Source: Ambulatory Visit | Attending: Otolaryngology | Admitting: Otolaryngology

## 2014-03-08 DIAGNOSIS — R7309 Other abnormal glucose: Secondary | ICD-10-CM | POA: Diagnosis not present

## 2014-03-08 DIAGNOSIS — J4489 Other specified chronic obstructive pulmonary disease: Secondary | ICD-10-CM | POA: Insufficient documentation

## 2014-03-08 DIAGNOSIS — H9319 Tinnitus, unspecified ear: Secondary | ICD-10-CM | POA: Diagnosis not present

## 2014-03-08 DIAGNOSIS — H7120 Cholesteatoma of mastoid, unspecified ear: Secondary | ICD-10-CM | POA: Diagnosis not present

## 2014-03-08 DIAGNOSIS — I10 Essential (primary) hypertension: Secondary | ICD-10-CM | POA: Diagnosis not present

## 2014-03-08 DIAGNOSIS — G4730 Sleep apnea, unspecified: Secondary | ICD-10-CM | POA: Insufficient documentation

## 2014-03-08 DIAGNOSIS — Z6841 Body Mass Index (BMI) 40.0 and over, adult: Secondary | ICD-10-CM | POA: Insufficient documentation

## 2014-03-08 DIAGNOSIS — J449 Chronic obstructive pulmonary disease, unspecified: Secondary | ICD-10-CM | POA: Diagnosis not present

## 2014-03-08 DIAGNOSIS — F17200 Nicotine dependence, unspecified, uncomplicated: Secondary | ICD-10-CM | POA: Diagnosis not present

## 2014-03-08 DIAGNOSIS — K219 Gastro-esophageal reflux disease without esophagitis: Secondary | ICD-10-CM | POA: Diagnosis not present

## 2014-03-08 DIAGNOSIS — Z9089 Acquired absence of other organs: Secondary | ICD-10-CM

## 2014-03-08 DIAGNOSIS — H7010 Chronic mastoiditis, unspecified ear: Secondary | ICD-10-CM | POA: Insufficient documentation

## 2014-03-08 HISTORY — DX: Sleep apnea, unspecified: G47.30

## 2014-03-08 HISTORY — DX: Presence of spectacles and contact lenses: Z97.3

## 2014-03-08 HISTORY — PX: TYMPANOMASTOIDECTOMY: SHX34

## 2014-03-08 LAB — POCT I-STAT, CHEM 8
BUN: 11 mg/dL (ref 6–23)
CALCIUM ION: 1.14 mmol/L (ref 1.13–1.30)
Chloride: 105 mEq/L (ref 96–112)
Creatinine, Ser: 0.7 mg/dL (ref 0.50–1.10)
GLUCOSE: 107 mg/dL — AB (ref 70–99)
HCT: 39 % (ref 36.0–46.0)
HEMOGLOBIN: 13.3 g/dL (ref 12.0–15.0)
POTASSIUM: 3.6 meq/L — AB (ref 3.7–5.3)
Sodium: 139 mEq/L (ref 137–147)
TCO2: 24 mmol/L (ref 0–100)

## 2014-03-08 LAB — GLUCOSE, CAPILLARY: Glucose-Capillary: 135 mg/dL — ABNORMAL HIGH (ref 70–99)

## 2014-03-08 SURGERY — TYMPANOPLASTY, WITH MASTOIDECTOMY
Anesthesia: General | Site: Ear | Laterality: Left

## 2014-03-08 MED ORDER — MIDAZOLAM HCL 2 MG/2ML IJ SOLN: 1.0000 mg | INTRAMUSCULAR | Status: DC | PRN

## 2014-03-08 MED ORDER — HYDROMORPHONE HCL PF 1 MG/ML IJ SOLN
0.2500 mg | INTRAMUSCULAR | Status: DC | PRN
Start: 2014-03-08 — End: 2014-03-08

## 2014-03-08 MED ORDER — OXYCODONE HCL 5 MG PO TABS: 5.0000 mg | ORAL_TABLET | Freq: Once | ORAL | Status: DC | PRN

## 2014-03-08 MED ORDER — OXYCODONE-ACETAMINOPHEN 5-325 MG PO TABS: 1.0000 | ORAL_TABLET | ORAL | Status: DC | PRN

## 2014-03-08 MED ORDER — LIDOCAINE-EPINEPHRINE 1 %-1:100000 IJ SOLN
INTRAMUSCULAR | Status: AC
  Filled 2014-03-08: qty 1

## 2014-03-08 MED ORDER — FENTANYL CITRATE 0.05 MG/ML IJ SOLN: 50.0000 ug | INTRAMUSCULAR | Status: DC | PRN

## 2014-03-08 MED ORDER — EPHEDRINE SULFATE 50 MG/ML IJ SOLN
INTRAMUSCULAR | Status: DC | PRN
Start: 2014-03-08 — End: 2014-03-08
  Administered 2014-03-08: 10 mg via INTRAVENOUS
  Administered 2014-03-08 (×2): 5 mg via INTRAVENOUS

## 2014-03-08 MED ORDER — PROPOFOL 10 MG/ML IV BOLUS
INTRAVENOUS | Status: DC | PRN
  Administered 2014-03-08: 160 mg via INTRAVENOUS
  Administered 2014-03-08: 40 mg via INTRAVENOUS

## 2014-03-08 MED ORDER — LIDOCAINE-EPINEPHRINE 1 %-1:100000 IJ SOLN
INTRAMUSCULAR | Status: DC | PRN
  Administered 2014-03-08: 4.5 mL via INTRADERMAL

## 2014-03-08 MED ORDER — MIDAZOLAM HCL 5 MG/5ML IJ SOLN
INTRAMUSCULAR | Status: DC | PRN
  Administered 2014-03-08: 2 mg via INTRAVENOUS

## 2014-03-08 MED ORDER — DEXAMETHASONE SODIUM PHOSPHATE 4 MG/ML IJ SOLN
INTRAMUSCULAR | Status: DC | PRN
  Administered 2014-03-08: 10 mg via INTRAVENOUS

## 2014-03-08 MED ORDER — LIDOCAINE HCL (CARDIAC) 20 MG/ML IV SOLN
INTRAVENOUS | Status: DC | PRN
  Administered 2014-03-08: 50 mg via INTRAVENOUS

## 2014-03-08 MED ORDER — FENTANYL CITRATE 0.05 MG/ML IJ SOLN
INTRAMUSCULAR | Status: DC | PRN
  Administered 2014-03-08: 50 ug via INTRAVENOUS
  Administered 2014-03-08 (×3): 25 ug via INTRAVENOUS
  Administered 2014-03-08 (×2): 50 ug via INTRAVENOUS
  Administered 2014-03-08 (×3): 25 ug via INTRAVENOUS

## 2014-03-08 MED ORDER — ONDANSETRON HCL 4 MG/2ML IJ SOLN: 4.0000 mg | Freq: Once | INTRAMUSCULAR | Status: DC | PRN

## 2014-03-08 MED ORDER — ONDANSETRON HCL 4 MG/2ML IJ SOLN
INTRAMUSCULAR | Status: DC | PRN
  Administered 2014-03-08: 4 mg via INTRAVENOUS

## 2014-03-08 MED ORDER — BSS IO SOLN
INTRAOCULAR | Status: AC
  Filled 2014-03-08: qty 15

## 2014-03-08 MED ORDER — AMOXICILLIN 875 MG PO TABS: 875.0000 mg | ORAL_TABLET | Freq: Two times a day (BID) | ORAL | Status: DC

## 2014-03-08 MED ORDER — CEFAZOLIN SODIUM-DEXTROSE 2-3 GM-% IV SOLR
INTRAVENOUS | Status: DC | PRN
  Administered 2014-03-08: 2 g via INTRAVENOUS

## 2014-03-08 MED ORDER — SODIUM CHLORIDE 0.9 % IR SOLN
Status: DC | PRN
  Administered 2014-03-08: 1000 mL

## 2014-03-08 MED ORDER — SCOPOLAMINE 1 MG/3DAYS TD PT72
MEDICATED_PATCH | TRANSDERMAL | Status: AC
  Filled 2014-03-08: qty 1

## 2014-03-08 MED ORDER — EPINEPHRINE HCL 1 MG/ML IJ SOLN
INTRAMUSCULAR | Status: AC
  Filled 2014-03-08: qty 1

## 2014-03-08 MED ORDER — LACTATED RINGERS IV SOLN
INTRAVENOUS | Status: DC
Start: 2014-03-08 — End: 2014-03-08
  Administered 2014-03-08 (×2): via INTRAVENOUS

## 2014-03-08 MED ORDER — BACITRACIN ZINC 500 UNIT/GM EX OINT
TOPICAL_OINTMENT | CUTANEOUS | Status: DC | PRN
  Administered 2014-03-08: 1 via TOPICAL

## 2014-03-08 MED ORDER — OXYMETAZOLINE HCL 0.05 % NA SOLN
NASAL | Status: AC
  Filled 2014-03-08: qty 15

## 2014-03-08 MED ORDER — SCOPOLAMINE 1 MG/3DAYS TD PT72
1.0000 | MEDICATED_PATCH | TRANSDERMAL | Status: DC
  Administered 2014-03-08: 1.5 mg via TRANSDERMAL

## 2014-03-08 MED ORDER — CIPROFLOXACIN-DEXAMETHASONE 0.3-0.1 % OT SUSP
OTIC | Status: AC
  Filled 2014-03-08: qty 7.5

## 2014-03-08 MED ORDER — OXYCODONE HCL 5 MG/5ML PO SOLN: 5.0000 mg | Freq: Once | ORAL | Status: DC | PRN

## 2014-03-08 MED ORDER — BACITRACIN ZINC 500 UNIT/GM EX OINT
TOPICAL_OINTMENT | CUTANEOUS | Status: AC
  Filled 2014-03-08: qty 28.35

## 2014-03-08 MED ORDER — CIPROFLOXACIN-DEXAMETHASONE 0.3-0.1 % OT SUSP
OTIC | Status: DC | PRN
  Administered 2014-03-08: 4 [drp] via OTIC

## 2014-03-08 MED ORDER — CEFAZOLIN SODIUM-DEXTROSE 2-3 GM-% IV SOLR
INTRAVENOUS | Status: AC
  Filled 2014-03-08: qty 50

## 2014-03-08 MED ORDER — MIDAZOLAM HCL 2 MG/2ML IJ SOLN
INTRAMUSCULAR | Status: AC
  Filled 2014-03-08: qty 2

## 2014-03-08 MED ORDER — EPINEPHRINE HCL 1 MG/ML IJ SOLN
INTRAMUSCULAR | Status: DC | PRN
  Administered 2014-03-08: 1 mg

## 2014-03-08 MED ORDER — SUCCINYLCHOLINE CHLORIDE 20 MG/ML IJ SOLN
INTRAMUSCULAR | Status: DC | PRN
  Administered 2014-03-08: 100 mg via INTRAVENOUS

## 2014-03-08 MED ORDER — FENTANYL CITRATE 0.05 MG/ML IJ SOLN
INTRAMUSCULAR | Status: AC
  Filled 2014-03-08: qty 6

## 2014-03-08 SURGICAL SUPPLY — 71 items
ADH SKN CLS APL DERMABOND .7 (GAUZE/BANDAGES/DRESSINGS) ×1
BAG URINE DRAINAGE (UROLOGICAL SUPPLIES) IMPLANT
BALL CTTN LRG ABS STRL LF (GAUZE/BANDAGES/DRESSINGS) ×1
BLADE CLIPPER SURG (BLADE) ×2 IMPLANT
BLADE NDL 3 SS STRL (BLADE) IMPLANT
BLADE NEEDLE 3 SS STRL (BLADE) IMPLANT
BLADE NEEDLE 3MM SS STRL (BLADE)
BUR DIAMOND COARSE 3.0 (BURR) ×2 IMPLANT
BURR 3.0 ROUND DIAMOND (BURR) ×1 IMPLANT
BURR 3.0MM ROUND DIAMOND (BURR) ×1
CANISTER SUCT 1200ML W/VALVE (MISCELLANEOUS) ×3 IMPLANT
CORDS BIPOLAR (ELECTRODE) IMPLANT
COTTONBALL LRG STERILE PKG (GAUZE/BANDAGES/DRESSINGS) ×3 IMPLANT
DECANTER SPIKE VIAL GLASS SM (MISCELLANEOUS) ×1 IMPLANT
DERMABOND ADVANCED (GAUZE/BANDAGES/DRESSINGS) ×2
DERMABOND ADVANCED .7 DNX12 (GAUZE/BANDAGES/DRESSINGS) ×1 IMPLANT
DRAIN PENROSE 1/2X12 LTX STRL (WOUND CARE) IMPLANT
DRAIN PENROSE 1/4X12 LTX STRL (WOUND CARE) IMPLANT
DRAPE INCISE 23X17 IOBAN STRL (DRAPES)
DRAPE INCISE 23X17 STRL (DRAPES) IMPLANT
DRAPE INCISE IOBAN 23X17 STRL (DRAPES) IMPLANT
DRAPE MICROSCOPE WILD 40.5X102 (DRAPES) ×3 IMPLANT
DRAPE SURG 17X23 STRL (DRAPES) ×3 IMPLANT
DRAPE SURG IRRIG POUCH 19X23 (DRAPES) ×3 IMPLANT
DRILL BIT LEGEND (BIT) IMPLANT
DRSG GLASSCOCK MASTOID ADT (GAUZE/BANDAGES/DRESSINGS) ×2 IMPLANT
DRSG GLASSCOCK MASTOID PED (GAUZE/BANDAGES/DRESSINGS) IMPLANT
ELECT COATED BLADE 2.86 ST (ELECTRODE) ×3 IMPLANT
ELECT PAIRED SUBDERMAL (MISCELLANEOUS) ×3
ELECT REM PT RETURN 9FT ADLT (ELECTROSURGICAL) ×3
ELECTRODE PAIRED SUBDERMAL (MISCELLANEOUS) ×1 IMPLANT
ELECTRODE REM PT RTRN 9FT ADLT (ELECTROSURGICAL) ×1 IMPLANT
GAUZE SPONGE 4X4 12PLY STRL (GAUZE/BANDAGES/DRESSINGS) ×2 IMPLANT
GLOVE BIO SURGEON STRL SZ7.5 (GLOVE) ×3 IMPLANT
GLOVE SURG SS PI 7.0 STRL IVOR (GLOVE) ×2 IMPLANT
GOWN STRL REUS W/ TWL LRG LVL3 (GOWN DISPOSABLE) ×2 IMPLANT
GOWN STRL REUS W/TWL LRG LVL3 (GOWN DISPOSABLE) ×6
HEMOSTAT SURGICEL .5X2 ABSORB (HEMOSTASIS) IMPLANT
HEMOSTAT SURGICEL 2X14 (HEMOSTASIS) IMPLANT
IV CATH AUTO 14GX1.75 SAFE ORG (IV SOLUTION) ×3 IMPLANT
IV NS 500ML (IV SOLUTION)
IV NS 500ML BAXH (IV SOLUTION) ×1 IMPLANT
NDL HYPO 25X1 1.5 SAFETY (NEEDLE) ×1 IMPLANT
NDL SAFETY ECLIPSE 18X1.5 (NEEDLE) ×1 IMPLANT
NEEDLE HYPO 18GX1.5 SHARP (NEEDLE) ×3
NEEDLE HYPO 25X1 1.5 SAFETY (NEEDLE) ×3 IMPLANT
NS IRRIG 1000ML POUR BTL (IV SOLUTION) ×3 IMPLANT
PACK AMBRUS EAR (MISCELLANEOUS) IMPLANT
PACK BASIN DAY SURGERY FS (CUSTOM PROCEDURE TRAY) ×3 IMPLANT
PACK ENT DAY SURGERY (CUSTOM PROCEDURE TRAY) ×3 IMPLANT
PENCIL BUTTON HOLSTER BLD 10FT (ELECTRODE) ×3 IMPLANT
PROBE NERVBE PRASS .33 (MISCELLANEOUS) IMPLANT
SET EXT MALE ROTATING LL 32IN (MISCELLANEOUS) ×3 IMPLANT
SET IV EXT TUBING FEMALE 31 (MISCELLANEOUS) ×1 IMPLANT
SLEEVE SCD COMPRESS KNEE MED (MISCELLANEOUS) ×2 IMPLANT
SPONGE GAUZE 4X4 12PLY STER LF (GAUZE/BANDAGES/DRESSINGS) ×2 IMPLANT
SPONGE SURGIFOAM ABS GEL 12-7 (HEMOSTASIS) ×2 IMPLANT
SUT BONE WAX W31G (SUTURE) IMPLANT
SUT VIC AB 3-0 SH 27 (SUTURE)
SUT VIC AB 3-0 SH 27X BRD (SUTURE) IMPLANT
SUT VIC AB 4-0 P-3 18XBRD (SUTURE) IMPLANT
SUT VIC AB 4-0 P3 18 (SUTURE)
SUT VICRYL 4-0 PS2 18IN ABS (SUTURE) ×2 IMPLANT
SYR 3ML 18GX1 1/2 (SYRINGE) ×3 IMPLANT
SYR 5ML LL (SYRINGE) IMPLANT
SYR BULB 3OZ (MISCELLANEOUS) ×3 IMPLANT
TOWEL OR 17X24 6PK STRL BLUE (TOWEL DISPOSABLE) ×3 IMPLANT
TOWEL OR NON WOVEN STRL DISP B (DISPOSABLE) ×3 IMPLANT
TRAY DSU PREP LF (CUSTOM PROCEDURE TRAY) ×3 IMPLANT
TRAY FOLEY CATH 16FR SILVER (SET/KITS/TRAYS/PACK) IMPLANT
TUBING IRRIGATION (MISCELLANEOUS) ×1 IMPLANT

## 2014-03-08 NOTE — Brief Op Note (Signed)
03/08/2014  10:17 AM  PATIENT:  [name]  63 y.o. female  PRE-OPERATIVE DIAGNOSIS:  LEFT MASTOID CHOLESTEATOMA, CHRONIC OTO-MASTOIDITIS  POST-OPERATIVE DIAGNOSIS:  LEFT MASTOID CHOLESTEATOMA, CHRONIC OTO-MASTOIDITIS  PROCEDURE:  Procedure(s): CANAL-WALL-UP TYMPANOMASTOIDECTOMY - LEFT  SURGEON:  Surgeon(s) and Role:    * Ascencion Dike, MD - Primary  PHYSICIAN ASSISTANT:   ASSISTANTS: none   ANESTHESIA:   general  EBL:  Total I/O In: 1800 [I.V.:1800] Out: -   BLOOD ADMINISTERED:none  DRAINS: none   LOCAL MEDICATIONS USED:  LIDOCAINE   SPECIMEN:  Source of Specimen:  Left mastoid and middle ear contents  DISPOSITION OF SPECIMEN:  PATHOLOGY  COUNTS:  YES  TOURNIQUET:  * No tourniquets in log *  DICTATION: .Other Dictation: Dictation Number  [id number]  PLAN OF CARE: Discharge to home after PACU  PATIENT DISPOSITION:  PACU - hemodynamically stable.   Delay start of Pharmacological VTE agent (>24hrs) due to surgical blood loss or risk of bleeding: not applicable

## 2014-03-08 NOTE — Transfer of Care (Signed)
Immediate Anesthesia Transfer of Care Note  Patient: [name]  Procedure(s) Performed: Procedure(s): TYMPANOMASTOIDECTOMY LEFT (Left)  Patient Location: PACU  Anesthesia Type:General  Level of Consciousness: awake and alert   Airway & Oxygen Therapy: Patient Spontanous Breathing and Patient connected to face mask oxygen  Post-op Assessment: Report given to PACU RN and Post -op Vital signs reviewed and stable  Post vital signs: Reviewed and stable  Complications: No apparent anesthesia complications

## 2014-03-08 NOTE — Anesthesia Postprocedure Evaluation (Signed)
  Anesthesia Post-op Note  Patient: [name]  Procedure(s) Performed: Procedure(s): TYMPANOMASTOIDECTOMY LEFT (Left)  Patient Location: PACU  Anesthesia Type: General   Level of Consciousness: awake, alert  and oriented  Airway and Oxygen Therapy: Patient Spontanous Breathing  Post-op Pain: mild  Post-op Assessment: Post-op Vital signs reviewed  Post-op Vital Signs: Reviewed  Last Vitals:  Filed Vitals:   03/08/14 1150  BP: 128/67  Pulse: 65  Temp: 36.3 C  Resp: 16    Complications: No apparent anesthesia complications

## 2014-03-08 NOTE — Discharge Instructions (Addendum)
POSTOPERATIVE INSTRUCTIONS FOR PATIENTS HAVING MASTOIDECTOMY SURGERY  1. You may have nausea, vomiting, or a low grade fever for a few days after surgery. This is not unusual.  However, if the nausea and vomiting become severe or last more than one day, please call our office. Medication for nausea may be prescribed. You may take Tylenol every four hours for fever. If your fever should rise above 101 F, please contact our office. 2. Limit your activities for one week. This includes avoiding heavy lifting (over 20lbs), vigorous exercise, and contact sports. 3. Do not blow your nose for approximately one week.  Any accumulation in the nose should be drawn back into the throat and expectorated through the mouth to avoid infecting the ear. If it is necessary to sneeze, do so with your mouth open to decrease pressure to your ears. Do not hold your nose to avoid sneezing.  4. You may wash your hair 2 days after the operation. Please protect the ear and any external incision from water. We recommend placing some plastic wrap over the ear and incision to help protect against water. It may be necessary to have someone help you during the first several washings.  5. Try to keep the incision clean and dry. You should clean crust from the incisional area with diluted hydrogen peroxide. Any time you are going to clean your ear, please wash your hands thoroughly prior to starting. 6. Some dull postoperative ear pain is expected. Your physician may prescribe pain medicine to help relieve your discomfort. If your postoperative pain increases and your medication is not helping, please call the office before taking any other medication that we have not prescribed or recommended. 7. If any of the following should occur, contact Dr.Teoh:   (Office: [phone])) a. Persistent bleeding                                                             b. Persistent fever c. Purulent drainage (pus) from the ear or  incision d. Increasing redness around the suture line e. Persistent pain or dizziness f. Facial weakness 8. Sometimes, with a larger incision behind the ear, the incision may open and drain. If it occurs, please contact our office. 9. If your physician prescribes an antibiotic, fill the prescription promptly and take all of the medicine as directed until the entire supply is gone. 10.  You may experience some popping and cracking sounds in the ear for up to several weeks. It may sound like you are talking in a barrel or a tunnel. This is normal and should not cause concern. 11. Because a nerve for taste passes through your ear, it is not unusual for your taste sensation to be altered for several weeks or months. 12. You may experience some numbness in your outer ear, earlobe, and the incision area. This is normal, and most of the numbness will be expected to fade over a period of time.  13. Your eardrum may look pink or red for up to a month postoperatively. The red coloration is due to fluid in the middle ear. The change in color should not be confused with infection.  14. It is important to return to our office for your postoperative appointment as scheduled. If for some reason you were not given a postoperative appointment, please call our office at [phone].   Post Anesthesia Home Care Instructions  Activity: Get plenty of rest for the remainder of the day. A responsible adult should stay with you for 24 hours following the procedure.  For the next 24 hours, DO NOT: -Drive a car -Paediatric nurse -Drink alcoholic beverages -Take any medication unless instructed by your physician -Make any legal decisions or sign important papers.  Meals: Start with liquid foods such as gelatin or soup. Progress to regular foods as tolerated. Avoid greasy, spicy, heavy foods. If nausea and/or vomiting occur, drink only clear liquids  until the nausea and/or vomiting subsides. Call your physician if vomiting continues.  Special Instructions/Symptoms: Your throat may feel dry or sore from the anesthesia or the breathing tube placed in your throat during surgery. If this causes discomfort, gargle with warm salt water. The discomfort should disappear within 24 hours.

## 2014-03-08 NOTE — H&P (Signed)
  H&P Update  Pt's original H&P dated 02/09/14 reviewed and placed in chart (to be scanned).  I personally examined the patient today.  No change in health. Proceed with left tympanomastoidectomy.

## 2014-03-08 NOTE — Anesthesia Preprocedure Evaluation (Addendum)
Anesthesia Evaluation  Patient identified by MRN, date of birth, ID band Patient awake    Reviewed: Allergy & Precautions, H&P , NPO status , Patient's Chart, lab work & pertinent test results, reviewed documented beta blocker date and time   Airway Mallampati: I TM Distance: >3 FB Neck ROM: Full    Dental  (+) Teeth Intact,    Pulmonary sleep apnea and Continuous Positive Airway Pressure Ventilation , COPD COPD inhaler, Current Smoker,  breath sounds clear to auscultation        Cardiovascular hypertension, Pt. on medications and Pt. on home beta blockers Rhythm:Regular Rate:Normal     Neuro/Psych    GI/Hepatic GERD-  Medicated and Controlled,  Endo/Other  diabetes ("Borderline")Morbid obesity  Renal/GU      Musculoskeletal   Abdominal   Peds  Hematology   Anesthesia Other Findings   Reproductive/Obstetrics                          Anesthesia Physical Anesthesia Plan  ASA: III  Anesthesia Plan: General   Post-op Pain Management:    Induction: Intravenous  Airway Management Planned: Oral ETT  Additional Equipment:   Intra-op Plan:   Post-operative Plan: Extubation in OR  Informed Consent: I have reviewed the patients History and Physical, chart, labs and discussed the procedure including the risks, benefits and alternatives for the proposed anesthesia with the patient or authorized representative who has indicated his/her understanding and acceptance.   Dental advisory given  Plan Discussed with: CRNA, Anesthesiologist and Surgeon  Anesthesia Plan Comments:         Anesthesia Quick Evaluation

## 2014-03-08 NOTE — Anesthesia Procedure Notes (Signed)
Procedure Name: Intubation Date/Time: 03/08/2014 8:12 AM Performed by: Lieutenant Diego Pre-anesthesia Checklist: Patient identified, Emergency Drugs available, Suction available and Patient being monitored Patient Re-evaluated:Patient Re-evaluated prior to inductionOxygen Delivery Method: Circle System Utilized Preoxygenation: Pre-oxygenation with 100% oxygen Intubation Type: IV induction Ventilation: Mask ventilation without difficulty Laryngoscope Size: Miller and 2 Grade View: Grade I Tube type: Oral Tube size: 7.0 mm Number of attempts: 1 Airway Equipment and Method: stylet and oral airway Placement Confirmation: ETT inserted through vocal cords under direct vision,  positive ETCO2 and breath sounds checked- equal and bilateral Secured at: 23 cm Tube secured with: Tape Dental Injury: Teeth and Oropharynx as per pre-operative assessment

## 2014-03-09 ENCOUNTER — Encounter (HOSPITAL_BASED_OUTPATIENT_CLINIC_OR_DEPARTMENT_OTHER): Payer: Self-pay | Admitting: Otolaryngology

## 2014-03-09 NOTE — Op Note (Signed)
NAMETALENA, NEIRA               ACCOUNT NO.:  [id number]  MEDICAL RECORD NO.:  [id number]  LOCATION:                                 FACILITY:  PHYSICIAN:  Leta Baptist, MD            DATE OF BIRTH:  1950-11-28  DATE OF PROCEDURE:  03/08/2014 DATE OF DISCHARGE:                              OPERATIVE REPORT   PREOPERATIVE DIAGNOSES: 1. Left ear cholesteatoma. 2. Left chronic otomastoiditis.  POSTOPERATIVE DIAGNOSES: 1. Left ear cholesteatoma. 2. Left chronic otomastoiditis.  PROCEDURE PERFORMED:  Left canal wall up tympanomastoidectomy.  ANESTHESIA:  General endotracheal tube anesthesia.  COMPLICATIONS:  None.  ESTIMATED BLOOD LOSS:  Less than 100 mL.  INDICATION FOR PROCEDURE:  The patient is a 63 year old female with a history of left ear pulsatile tinnitus for more than 10 years.  The severity and frequency of the tinnitus has progressively worsened over the past year.  In addition, the patient also complains of constant pressure sensation and discomfort in the left ear.  Over the past month, she also complains of recurrent dizziness.  The patient subsequently underwent an MRI scan of her head and CT scan of her temporal bones. The images showed opacification of the left mastoid cavity, with soft tissue density within the antrum and at the tympanum.  The findings were consistent with left ear cholesteatoma.  Based on the above findings, the decision was made for the patient to undergo the above stated procedure.  The risks, benefits, alternatives, and details of the procedure were discussed with the patient.  Questions were invited and answered.  Informed consent was obtained.  DESCRIPTION:  The patient was taken to the operating room and placed supine on the operating table.  General endotracheal tube anesthesia was administered by the anesthesiologist.  Preop IV antibiotics were given. The patient was positioned and prepped and draped in a standard fashion for left  ear surgery.  Facial nerve monitoring electrodes were placed. The facial nerve monitoring system was noted to be functional throughout the case.  Under the operating microscope, the left ear canal was examined. Cerumen was removed from the ear canal.  A 1% lidocaine with 1:100,000 epinephrine was injected into all 4 quadrants of the left ear canal.  A standard tympanomeatal flap was elevated in a standard fashion.  Small amount of cholesteatoma and polypoid tissue was noted within the epitympanum.  The ossicles were noted to be intact.  No other cholesteatoma was noted.  Attention was then focused on the mastoid.  The standard postauricular incision was made.  The soft tissue overlying the mastoid cortex was elevated in a standard fashion.  A standard cortical mastoidectomy was performed using a #6 cutting bur.  The tegmen, posterior canal wall, and sigmoid sinus were identified and preserved.  It should be noted that the patient's left mastoid cavity was filled with yellowish fluid and polypoid tissue.  Squamous debris consistent with cholesteatoma was also noted.  The cortical mastoidectomy procedure was then extended into the antrum and epitympanum.  The ossicles were identified.  All the visible polypoid tissue and cholesteatoma were removed.  It should be noted that the small area  of dehiscent was noted at the superior epitympanum.  The surgical site was copiously irrigated.  The middle ear space and mastoid cavity were then filled with Ciprodex solution.  The postauricular incision was closed in layers with 4-0 Vicryl and Dermabond.  The tympanomeatal flap was then returned to its normal anatomic position. The ear canal was filled with antibiotic ointment.  The care of the patient was turned over to the anesthesiologist.  The patient was awakened from anesthesia without difficulty.  She was extubated and transferred to the recovery room in good condition.  OPERATIVE FINDINGS:   Left chronic otomastoiditis, with a large amount of polypoid tissue and cholesteatoma within the mastoid and epitympanum.  SPECIMEN:  Left mastoid and middle ear contents.  FOLLOWUP CARE:  The patient will be discharged home once she is awake and alert.  She will follow up in my office in 1 week.  She will be placed on Percocet p.r.n. pain and amoxicillin for 5 days.     Leta Baptist, MD     ST/MEDQ  D:  03/08/2014  T:  03/09/2014  Job:  [id number]

## 2014-05-04 ENCOUNTER — Other Ambulatory Visit: Payer: Self-pay | Admitting: Internal Medicine

## 2014-05-04 DIAGNOSIS — R0602 Shortness of breath: Secondary | ICD-10-CM

## 2014-05-04 DIAGNOSIS — R942 Abnormal results of pulmonary function studies: Secondary | ICD-10-CM

## 2014-05-04 DIAGNOSIS — R079 Chest pain, unspecified: Secondary | ICD-10-CM

## 2014-05-06 ENCOUNTER — Other Ambulatory Visit: Payer: PRIVATE HEALTH INSURANCE

## 2014-06-02 ENCOUNTER — Encounter (HOSPITAL_COMMUNITY): Payer: Self-pay | Admitting: Cardiology

## 2014-09-05 ENCOUNTER — Other Ambulatory Visit: Payer: Self-pay | Admitting: Internal Medicine

## 2014-09-05 DIAGNOSIS — R079 Chest pain, unspecified: Secondary | ICD-10-CM

## 2014-09-07 ENCOUNTER — Ambulatory Visit
Admission: RE | Admit: 2014-09-07 | Discharge: 2014-09-07 | Disposition: A | Discharge status: Home / Self Care | Payer: Medicaid Other | Source: Ambulatory Visit | Attending: Internal Medicine | Admitting: Internal Medicine

## 2014-09-07 DIAGNOSIS — R079 Chest pain, unspecified: Secondary | ICD-10-CM

## 2014-09-18 ENCOUNTER — Emergency Department (HOSPITAL_COMMUNITY)
Admission: EM | Admit: 2014-09-18 | Discharge: 2014-09-18 | Disposition: A | Discharge status: Home / Self Care | Payer: Medicare Other | Attending: Emergency Medicine | Admitting: Emergency Medicine

## 2014-09-18 ENCOUNTER — Encounter (HOSPITAL_COMMUNITY): Payer: Self-pay | Admitting: *Deleted

## 2014-09-18 DIAGNOSIS — Z720 Tobacco use: Secondary | ICD-10-CM | POA: Diagnosis not present

## 2014-09-18 DIAGNOSIS — J449 Chronic obstructive pulmonary disease, unspecified: Secondary | ICD-10-CM | POA: Insufficient documentation

## 2014-09-18 DIAGNOSIS — F419 Anxiety disorder, unspecified: Secondary | ICD-10-CM | POA: Insufficient documentation

## 2014-09-18 DIAGNOSIS — Z792 Long term (current) use of antibiotics: Secondary | ICD-10-CM | POA: Insufficient documentation

## 2014-09-18 DIAGNOSIS — E119 Type 2 diabetes mellitus without complications: Secondary | ICD-10-CM | POA: Diagnosis not present

## 2014-09-18 DIAGNOSIS — I10 Essential (primary) hypertension: Secondary | ICD-10-CM | POA: Insufficient documentation

## 2014-09-18 DIAGNOSIS — Z79899 Other long term (current) drug therapy: Secondary | ICD-10-CM | POA: Insufficient documentation

## 2014-09-18 DIAGNOSIS — G4730 Sleep apnea, unspecified: Secondary | ICD-10-CM | POA: Diagnosis not present

## 2014-09-18 DIAGNOSIS — F329 Major depressive disorder, single episode, unspecified: Secondary | ICD-10-CM | POA: Diagnosis not present

## 2014-09-18 DIAGNOSIS — Z8719 Personal history of other diseases of the digestive system: Secondary | ICD-10-CM | POA: Insufficient documentation

## 2014-09-18 DIAGNOSIS — X58XXXA Exposure to other specified factors, initial encounter: Secondary | ICD-10-CM | POA: Diagnosis not present

## 2014-09-18 DIAGNOSIS — S199XXA Unspecified injury of neck, initial encounter: Secondary | ICD-10-CM | POA: Diagnosis present

## 2014-09-18 DIAGNOSIS — Y9389 Activity, other specified: Secondary | ICD-10-CM | POA: Diagnosis not present

## 2014-09-18 DIAGNOSIS — E785 Hyperlipidemia, unspecified: Secondary | ICD-10-CM | POA: Insufficient documentation

## 2014-09-18 DIAGNOSIS — Z9981 Dependence on supplemental oxygen: Secondary | ICD-10-CM | POA: Insufficient documentation

## 2014-09-18 DIAGNOSIS — R011 Cardiac murmur, unspecified: Secondary | ICD-10-CM | POA: Diagnosis not present

## 2014-09-18 DIAGNOSIS — M1990 Unspecified osteoarthritis, unspecified site: Secondary | ICD-10-CM | POA: Insufficient documentation

## 2014-09-18 DIAGNOSIS — T148XXA Other injury of unspecified body region, initial encounter: Secondary | ICD-10-CM

## 2014-09-18 DIAGNOSIS — Y9289 Other specified places as the place of occurrence of the external cause: Secondary | ICD-10-CM | POA: Insufficient documentation

## 2014-09-18 DIAGNOSIS — S161XXA Strain of muscle, fascia and tendon at neck level, initial encounter: Secondary | ICD-10-CM | POA: Diagnosis not present

## 2014-09-18 DIAGNOSIS — Y999 Unspecified external cause status: Secondary | ICD-10-CM | POA: Diagnosis not present

## 2014-09-18 DIAGNOSIS — Z9889 Other specified postprocedural states: Secondary | ICD-10-CM | POA: Insufficient documentation

## 2014-09-18 DIAGNOSIS — Z86718 Personal history of other venous thrombosis and embolism: Secondary | ICD-10-CM | POA: Diagnosis not present

## 2014-09-18 DIAGNOSIS — G8929 Other chronic pain: Secondary | ICD-10-CM | POA: Insufficient documentation

## 2014-09-18 MED ORDER — CYCLOBENZAPRINE HCL 10 MG PO TABS: 10.0000 mg | ORAL_TABLET | Freq: Two times a day (BID) | ORAL | Status: DC | PRN

## 2014-09-18 NOTE — ED Notes (Signed)
Pt reports when she woke up Friday morning the Rt side of her neck hurt . Pt took 2 oxycodone for neck pain without a reduction in the level

## 2014-09-18 NOTE — ED Provider Notes (Signed)
CSN: [id number]     Arrival date & time 09/18/14  0846 History   First MD Initiated Contact with Patient 09/18/14 0848   This chart was scribed for non-physician practitioner, Glendell Docker, NP, working with Dorie Rank, MD, by Ian Bushman, ED Scribe. This patient was seen in room TR07C/TR07C and the patient's care was started at 9:04 AM.    Chief Complaint  Patient presents with  . Neck Pain     (Consider location/radiation/quality/duration/timing/severity/associated sxs/prior Treatment) HPI  HPI Comments: [name] is a 64 y.o. female who presents to the Emergency Department complaining of constant pain on the right side of her neck onset two days ago.  Patient denies any recent injury and states that she woke up Friday morning with the pain. Patient has taken medication but denies any relief.  Patient has a history of diabetes, hypertension, degenerative disc disease. She has no other complaints today. Denies numbness, weakness or fever. Has full rom   Past Medical History  Diagnosis Date  . Hypertension   . Hyperlipemia   . Chronic pain   . Anxiety   . Depression   . GERD (gastroesophageal reflux disease)   . Diabetes mellitus without complication   . COPD (chronic obstructive pulmonary disease)   . Heart murmur   . Arthritis   . Deep vein blood clot of right lower extremity 12/29/2012    Dr. Nadyne Coombes, stent in place  . Sleep apnea     uses a cpap  . Wears glasses    Past Surgical History  Procedure Laterality Date  . Cesarean section    . Uterine fibroid surgery    . Hand surgery    . Abdominal hysterectomy    . Stent in right leg  12/29/2012    for blood clot, Dr. Nadyne Coombes  . Esophagogastroduodenoscopy N/A 01/08/2013    Procedure: ESOPHAGOGASTRODUODENOSCOPY (EGD);  Surgeon: Beryle Beams, MD;  Location: [facility];  Service: Endoscopy;  Laterality: N/A;  . Colonoscopy N/A 01/08/2013    Procedure: COLONOSCOPY;  Surgeon: Beryle Beams, MD;  Location: [facility];   Service: Endoscopy;  Laterality: N/A;  . Cardiac catheterization  4/15  . Tympanomastoidectomy Left 03/08/2014    Procedure: TYMPANOMASTOIDECTOMY LEFT;  Surgeon: Ascencion Dike, MD;  Location: [facility];  Service: ENT;  Laterality: Left;  . Lower extremity angiogram N/A 12/29/2012    Procedure: LOWER EXTREMITY ANGIOGRAM;  Surgeon: Laverda Page, MD;  Location: [facility] CATH LAB;  Service: Cardiovascular;  Laterality: N/A;  . Left heart catheterization with coronary angiogram N/A 03/09/2013    Procedure: LEFT HEART CATHETERIZATION WITH CORONARY ANGIOGRAM;  Surgeon: Laverda Page, MD;  Location: [facility] CATH LAB;  Service: Cardiovascular;  Laterality: N/A;  . Lower extremity angiogram N/A 10/05/2013    Procedure: LOWER EXTREMITY ANGIOGRAM;  Surgeon: Laverda Page, MD;  Location: [facility] CATH LAB;  Service: Cardiovascular;  Laterality: N/A;   Family History  Problem Relation Age of Onset  . Diabetes Mother   . Hypertension Mother   . Heart disease Mother   . Heart disease Father   . Hypertension Father    History  Substance Use Topics  . Smoking status: Current Every Day Smoker -- 0.25 packs/day for 41 years    Types: Cigarettes  . Smokeless tobacco: Never Used  . Alcohol Use: No   OB History    No data available     Review of Systems  Constitutional: Negative for fever and chills.  HENT: Negative  for drooling.   Respiratory: Negative for cough.   Musculoskeletal: Positive for neck pain.      Allergies  Review of patient's allergies indicates no known allergies.  Home Medications   Prior to Admission medications   Medication Sig Start Date End Date Taking? Authorizing Provider  albuterol (PROVENTIL HFA;VENTOLIN HFA) 108 (90 BASE) MCG/ACT inhaler Inhale 2 puffs into the lungs every 6 (six) hours as needed for wheezing. For shortness of breath    Historical Provider, MD  amLODipine (NORVASC) 10 MG tablet Take 1 tablet (10 mg total) by mouth daily. 11/23/13   Milton Ferguson,  MD  amoxicillin (AMOXIL) 875 MG tablet Take 1 tablet (875 mg total) by mouth 2 (two) times daily. 03/08/14   Leta Baptist, MD  Ascorbic Acid (VITAMIN C PO) Take 1 tablet by mouth daily as needed (Energy). No specific days of the week.    Historical Provider, MD  buPROPion (WELLBUTRIN SR) 150 MG 12 hr tablet Take 150 mg by mouth 2 (two) times daily.    Historical Provider, MD  cholecalciferol (VITAMIN D) 1000 UNITS tablet Take 1,000 Units by mouth daily.    Historical Provider, MD  furosemide (LASIX) 40 MG tablet Take 40 mg by mouth daily.  11/16/13   Historical Provider, MD  gabapentin (NEURONTIN) 300 MG capsule Take 300 mg by mouth at bedtime.  11/16/13   Historical Provider, MD  lisinopril-hydrochlorothiazide (PRINZIDE,ZESTORETIC) 20-12.5 MG per tablet Take 1 tablet by mouth daily.  11/13/13   Historical Provider, MD  metFORMIN (GLUCOPHAGE) 500 MG tablet Take 0.5 tablets (250 mg total) by mouth 2 (two) times daily with a meal. 10/07/13   Adrian Prows, MD  metoprolol succinate (TOPROL-XL) 25 MG 24 hr tablet Take 25 mg by mouth daily.    Historical Provider, MD  nitroGLYCERIN (NITROSTAT) 0.4 MG SL tablet Place 0.4 mg under the tongue every 5 (five) minutes as needed for chest pain.    Historical Provider, MD  oxyCODONE-acetaminophen (ROXICET) 5-325 MG per tablet Take 1 tablet by mouth every 4 (four) hours as needed for moderate pain or severe pain. 03/08/14   Leta Baptist, MD  pravastatin (PRAVACHOL) 20 MG tablet Take 20 mg by mouth daily.  11/16/13   Historical Provider, MD  valsartan (DIOVAN) 320 MG tablet Take 320 mg by mouth daily.    Historical Provider, MD   BP 153/75 mmHg  Pulse 67  Temp(Src) 98.2 F (36.8 C) (Oral)  Resp 20  Ht 4\' 11"  (1.499 m)  Wt 210 lb (95.255 kg)  BMI 42.39 kg/m2  SpO2 97% Physical Exam  Constitutional: She is oriented to person, place, and time. She appears well-developed and well-nourished. No distress.  HENT:  Head: Normocephalic and atraumatic.  Eyes: Conjunctivae and EOM are  normal.  Neck: Neck supple.  Cardiovascular: Normal rate.   Pulmonary/Chest: Effort normal. No respiratory distress.  Musculoskeletal: Normal range of motion.  Tender to the right cervical paraspinal area. No rash. Full rom. Equal grip strength.  Neurological: She is alert and oriented to person, place, and time.  Skin: Skin is warm and dry.  Psychiatric: She has a normal mood and affect. Her behavior is normal.  Nursing note and vitals reviewed.   ED Course  Procedures (including critical care time) DIAGNOSTIC STUDIES: Oxygen Saturation is 97% on RA, normal by my interpretation.    COORDINATION OF CARE: 9:07 AM Discussed treatment plan with patient at beside, the patient agrees with the plan and has no further questions at this time.  Labs Review Labs Reviewed - No data to display  Imaging Review No results found.   EKG Interpretation None      MDM   Final diagnoses:  Muscle strain   Likely strain. Don't think imaging is needed at this time. Neurologically intact.given flexeril for symptoms  I personally performed the services described in this documentation, which was scribed in my presence. The recorded information has been reviewed and is accurate.    Glendell Docker, NP 09/18/14 [place], NP 09/18/14 [place], MD 09/20/14 [phone]

## 2014-09-18 NOTE — ED Notes (Signed)
Declined W/C at D/C and was escorted to lobby by RN. 

## 2014-09-18 NOTE — Discharge Instructions (Signed)

## 2014-11-14 ENCOUNTER — Other Ambulatory Visit (HOSPITAL_COMMUNITY): Payer: Self-pay | Admitting: Internal Medicine

## 2014-11-14 DIAGNOSIS — Z1231 Encounter for screening mammogram for malignant neoplasm of breast: Secondary | ICD-10-CM

## 2014-12-16 ENCOUNTER — Ambulatory Visit (HOSPITAL_COMMUNITY)
Admission: RE | Admit: 2014-12-16 | Discharge: 2014-12-16 | Disposition: A | Discharge status: Home / Self Care | Payer: Medicare Other | Source: Ambulatory Visit | Attending: Internal Medicine | Admitting: Internal Medicine

## 2014-12-16 DIAGNOSIS — Z1231 Encounter for screening mammogram for malignant neoplasm of breast: Secondary | ICD-10-CM | POA: Diagnosis present

## 2015-01-30 DIAGNOSIS — E1140 Type 2 diabetes mellitus with diabetic neuropathy, unspecified: Secondary | ICD-10-CM | POA: Diagnosis not present

## 2015-01-30 DIAGNOSIS — I10 Essential (primary) hypertension: Secondary | ICD-10-CM | POA: Diagnosis not present

## 2015-01-30 DIAGNOSIS — E785 Hyperlipidemia, unspecified: Secondary | ICD-10-CM | POA: Diagnosis not present

## 2015-01-30 DIAGNOSIS — F17200 Nicotine dependence, unspecified, uncomplicated: Secondary | ICD-10-CM | POA: Diagnosis not present

## 2015-01-30 DIAGNOSIS — F329 Major depressive disorder, single episode, unspecified: Secondary | ICD-10-CM | POA: Diagnosis not present

## 2015-02-06 DIAGNOSIS — H6122 Impacted cerumen, left ear: Secondary | ICD-10-CM | POA: Diagnosis not present

## 2015-02-06 DIAGNOSIS — H7122 Cholesteatoma of mastoid, left ear: Secondary | ICD-10-CM | POA: Diagnosis not present

## 2015-02-13 DIAGNOSIS — F17200 Nicotine dependence, unspecified, uncomplicated: Secondary | ICD-10-CM | POA: Diagnosis not present

## 2015-02-13 DIAGNOSIS — I10 Essential (primary) hypertension: Secondary | ICD-10-CM | POA: Diagnosis not present

## 2015-02-13 DIAGNOSIS — E785 Hyperlipidemia, unspecified: Secondary | ICD-10-CM | POA: Diagnosis not present

## 2015-02-13 DIAGNOSIS — E1140 Type 2 diabetes mellitus with diabetic neuropathy, unspecified: Secondary | ICD-10-CM | POA: Diagnosis not present

## 2015-02-13 DIAGNOSIS — R635 Abnormal weight gain: Secondary | ICD-10-CM | POA: Diagnosis not present

## 2015-04-06 DIAGNOSIS — H269 Unspecified cataract: Secondary | ICD-10-CM | POA: Diagnosis not present

## 2015-04-06 DIAGNOSIS — E119 Type 2 diabetes mellitus without complications: Secondary | ICD-10-CM | POA: Diagnosis not present

## 2015-04-18 DIAGNOSIS — I739 Peripheral vascular disease, unspecified: Secondary | ICD-10-CM | POA: Diagnosis not present

## 2015-04-18 DIAGNOSIS — Z01118 Encounter for examination of ears and hearing with other abnormal findings: Secondary | ICD-10-CM | POA: Diagnosis not present

## 2015-04-18 DIAGNOSIS — E1140 Type 2 diabetes mellitus with diabetic neuropathy, unspecified: Secondary | ICD-10-CM | POA: Diagnosis not present

## 2015-04-18 DIAGNOSIS — Z136 Encounter for screening for cardiovascular disorders: Secondary | ICD-10-CM | POA: Diagnosis not present

## 2015-04-18 DIAGNOSIS — Z23 Encounter for immunization: Secondary | ICD-10-CM | POA: Diagnosis not present

## 2015-04-18 DIAGNOSIS — Z0000 Encounter for general adult medical examination without abnormal findings: Secondary | ICD-10-CM | POA: Diagnosis not present

## 2015-04-18 DIAGNOSIS — Z1389 Encounter for screening for other disorder: Secondary | ICD-10-CM | POA: Diagnosis not present

## 2015-04-18 DIAGNOSIS — Z0100 Encounter for examination of eyes and vision without abnormal findings: Secondary | ICD-10-CM | POA: Diagnosis not present

## 2015-04-18 DIAGNOSIS — Z131 Encounter for screening for diabetes mellitus: Secondary | ICD-10-CM | POA: Diagnosis not present

## 2015-04-18 DIAGNOSIS — Z113 Encounter for screening for infections with a predominantly sexual mode of transmission: Secondary | ICD-10-CM | POA: Diagnosis not present

## 2015-04-18 DIAGNOSIS — J449 Chronic obstructive pulmonary disease, unspecified: Secondary | ICD-10-CM | POA: Diagnosis not present

## 2015-05-10 DIAGNOSIS — E785 Hyperlipidemia, unspecified: Secondary | ICD-10-CM | POA: Diagnosis not present

## 2015-05-10 DIAGNOSIS — I10 Essential (primary) hypertension: Secondary | ICD-10-CM | POA: Diagnosis not present

## 2015-05-10 DIAGNOSIS — F17200 Nicotine dependence, unspecified, uncomplicated: Secondary | ICD-10-CM | POA: Diagnosis not present

## 2015-05-10 DIAGNOSIS — E1140 Type 2 diabetes mellitus with diabetic neuropathy, unspecified: Secondary | ICD-10-CM | POA: Diagnosis not present

## 2015-05-10 DIAGNOSIS — J449 Chronic obstructive pulmonary disease, unspecified: Secondary | ICD-10-CM | POA: Diagnosis not present

## 2015-07-06 DIAGNOSIS — R0602 Shortness of breath: Secondary | ICD-10-CM | POA: Diagnosis not present

## 2015-07-06 DIAGNOSIS — I25119 Atherosclerotic heart disease of native coronary artery with unspecified angina pectoris: Secondary | ICD-10-CM | POA: Diagnosis not present

## 2015-07-06 DIAGNOSIS — I739 Peripheral vascular disease, unspecified: Secondary | ICD-10-CM | POA: Diagnosis not present

## 2015-07-06 DIAGNOSIS — I10 Essential (primary) hypertension: Secondary | ICD-10-CM | POA: Diagnosis not present

## 2015-07-14 DIAGNOSIS — M4316 Spondylolisthesis, lumbar region: Secondary | ICD-10-CM | POA: Diagnosis not present

## 2015-07-14 DIAGNOSIS — M5136 Other intervertebral disc degeneration, lumbar region: Secondary | ICD-10-CM | POA: Diagnosis not present

## 2015-07-14 DIAGNOSIS — M1288 Other specific arthropathies, not elsewhere classified, other specified site: Secondary | ICD-10-CM | POA: Diagnosis not present

## 2015-07-14 DIAGNOSIS — G894 Chronic pain syndrome: Secondary | ICD-10-CM | POA: Diagnosis not present

## 2015-07-21 DIAGNOSIS — E783 Hyperchylomicronemia: Secondary | ICD-10-CM | POA: Diagnosis not present

## 2015-07-25 DIAGNOSIS — R0989 Other specified symptoms and signs involving the circulatory and respiratory systems: Secondary | ICD-10-CM | POA: Diagnosis not present

## 2015-07-25 DIAGNOSIS — I739 Peripheral vascular disease, unspecified: Secondary | ICD-10-CM | POA: Diagnosis not present

## 2015-07-26 DIAGNOSIS — H6123 Impacted cerumen, bilateral: Secondary | ICD-10-CM | POA: Diagnosis not present

## 2015-07-26 DIAGNOSIS — H7122 Cholesteatoma of mastoid, left ear: Secondary | ICD-10-CM | POA: Diagnosis not present

## 2015-08-01 DIAGNOSIS — M1288 Other specific arthropathies, not elsewhere classified, other specified site: Secondary | ICD-10-CM | POA: Diagnosis not present

## 2015-08-01 DIAGNOSIS — M4316 Spondylolisthesis, lumbar region: Secondary | ICD-10-CM | POA: Diagnosis not present

## 2015-08-01 DIAGNOSIS — G894 Chronic pain syndrome: Secondary | ICD-10-CM | POA: Diagnosis not present

## 2015-08-01 DIAGNOSIS — Z79891 Long term (current) use of opiate analgesic: Secondary | ICD-10-CM | POA: Diagnosis not present

## 2015-08-04 DIAGNOSIS — R0989 Other specified symptoms and signs involving the circulatory and respiratory systems: Secondary | ICD-10-CM | POA: Diagnosis not present

## 2015-08-04 DIAGNOSIS — I739 Peripheral vascular disease, unspecified: Secondary | ICD-10-CM | POA: Diagnosis not present

## 2015-08-04 DIAGNOSIS — I25119 Atherosclerotic heart disease of native coronary artery with unspecified angina pectoris: Secondary | ICD-10-CM | POA: Diagnosis not present

## 2015-08-16 DIAGNOSIS — M47816 Spondylosis without myelopathy or radiculopathy, lumbar region: Secondary | ICD-10-CM | POA: Diagnosis not present

## 2015-08-30 DIAGNOSIS — G894 Chronic pain syndrome: Secondary | ICD-10-CM | POA: Diagnosis not present

## 2015-08-30 DIAGNOSIS — M5136 Other intervertebral disc degeneration, lumbar region: Secondary | ICD-10-CM | POA: Diagnosis not present

## 2015-08-30 DIAGNOSIS — M1288 Other specific arthropathies, not elsewhere classified, other specified site: Secondary | ICD-10-CM | POA: Diagnosis not present

## 2015-08-30 DIAGNOSIS — Z79891 Long term (current) use of opiate analgesic: Secondary | ICD-10-CM | POA: Diagnosis not present

## 2015-09-12 DIAGNOSIS — I10 Essential (primary) hypertension: Secondary | ICD-10-CM | POA: Diagnosis not present

## 2015-09-12 DIAGNOSIS — E785 Hyperlipidemia, unspecified: Secondary | ICD-10-CM | POA: Diagnosis not present

## 2015-09-12 DIAGNOSIS — J449 Chronic obstructive pulmonary disease, unspecified: Secondary | ICD-10-CM | POA: Diagnosis not present

## 2015-09-12 DIAGNOSIS — E1140 Type 2 diabetes mellitus with diabetic neuropathy, unspecified: Secondary | ICD-10-CM | POA: Diagnosis not present

## 2015-10-04 DIAGNOSIS — M5136 Other intervertebral disc degeneration, lumbar region: Secondary | ICD-10-CM | POA: Diagnosis not present

## 2015-10-04 DIAGNOSIS — M4316 Spondylolisthesis, lumbar region: Secondary | ICD-10-CM | POA: Diagnosis not present

## 2015-10-04 DIAGNOSIS — G894 Chronic pain syndrome: Secondary | ICD-10-CM | POA: Diagnosis not present

## 2015-10-25 DIAGNOSIS — M5416 Radiculopathy, lumbar region: Secondary | ICD-10-CM | POA: Diagnosis not present

## 2015-11-14 DIAGNOSIS — E1140 Type 2 diabetes mellitus with diabetic neuropathy, unspecified: Secondary | ICD-10-CM | POA: Diagnosis not present

## 2015-11-14 DIAGNOSIS — J449 Chronic obstructive pulmonary disease, unspecified: Secondary | ICD-10-CM | POA: Diagnosis not present

## 2015-11-14 DIAGNOSIS — I10 Essential (primary) hypertension: Secondary | ICD-10-CM | POA: Diagnosis not present

## 2015-11-14 DIAGNOSIS — E785 Hyperlipidemia, unspecified: Secondary | ICD-10-CM | POA: Diagnosis not present

## 2015-11-29 DIAGNOSIS — M4316 Spondylolisthesis, lumbar region: Secondary | ICD-10-CM | POA: Diagnosis not present

## 2015-11-29 DIAGNOSIS — M5136 Other intervertebral disc degeneration, lumbar region: Secondary | ICD-10-CM | POA: Diagnosis not present

## 2015-12-05 ENCOUNTER — Other Ambulatory Visit: Payer: Self-pay | Admitting: Internal Medicine

## 2015-12-05 DIAGNOSIS — Z1231 Encounter for screening mammogram for malignant neoplasm of breast: Secondary | ICD-10-CM

## 2015-12-13 ENCOUNTER — Ambulatory Visit
Admission: RE | Admit: 2015-12-13 | Discharge: 2015-12-13 | Disposition: A | Discharge status: Home / Self Care | Payer: Medicare Other | Source: Ambulatory Visit | Attending: Internal Medicine | Admitting: Internal Medicine

## 2015-12-13 DIAGNOSIS — Z1231 Encounter for screening mammogram for malignant neoplasm of breast: Secondary | ICD-10-CM

## 2015-12-25 ENCOUNTER — Encounter (HOSPITAL_COMMUNITY): Payer: Self-pay

## 2015-12-25 ENCOUNTER — Emergency Department (HOSPITAL_COMMUNITY): Payer: Medicare Other

## 2015-12-25 DIAGNOSIS — K219 Gastro-esophageal reflux disease without esophagitis: Secondary | ICD-10-CM | POA: Insufficient documentation

## 2015-12-25 DIAGNOSIS — R0789 Other chest pain: Secondary | ICD-10-CM | POA: Insufficient documentation

## 2015-12-25 DIAGNOSIS — J449 Chronic obstructive pulmonary disease, unspecified: Secondary | ICD-10-CM | POA: Insufficient documentation

## 2015-12-25 DIAGNOSIS — R079 Chest pain, unspecified: Secondary | ICD-10-CM | POA: Diagnosis not present

## 2015-12-25 DIAGNOSIS — I10 Essential (primary) hypertension: Secondary | ICD-10-CM | POA: Diagnosis not present

## 2015-12-25 DIAGNOSIS — E785 Hyperlipidemia, unspecified: Secondary | ICD-10-CM | POA: Diagnosis not present

## 2015-12-25 DIAGNOSIS — F1290 Cannabis use, unspecified, uncomplicated: Secondary | ICD-10-CM | POA: Insufficient documentation

## 2015-12-25 DIAGNOSIS — I700 Atherosclerosis of aorta: Secondary | ICD-10-CM | POA: Insufficient documentation

## 2015-12-25 DIAGNOSIS — K5730 Diverticulosis of large intestine without perforation or abscess without bleeding: Secondary | ICD-10-CM | POA: Diagnosis not present

## 2015-12-25 DIAGNOSIS — Z86718 Personal history of other venous thrombosis and embolism: Secondary | ICD-10-CM | POA: Diagnosis not present

## 2015-12-25 DIAGNOSIS — Z7984 Long term (current) use of oral hypoglycemic drugs: Secondary | ICD-10-CM | POA: Diagnosis not present

## 2015-12-25 DIAGNOSIS — K228 Other specified diseases of esophagus: Secondary | ICD-10-CM | POA: Insufficient documentation

## 2015-12-25 DIAGNOSIS — G4730 Sleep apnea, unspecified: Secondary | ICD-10-CM | POA: Insufficient documentation

## 2015-12-25 DIAGNOSIS — F329 Major depressive disorder, single episode, unspecified: Secondary | ICD-10-CM | POA: Insufficient documentation

## 2015-12-25 DIAGNOSIS — D123 Benign neoplasm of transverse colon: Secondary | ICD-10-CM | POA: Insufficient documentation

## 2015-12-25 DIAGNOSIS — R011 Cardiac murmur, unspecified: Secondary | ICD-10-CM | POA: Diagnosis not present

## 2015-12-25 DIAGNOSIS — F419 Anxiety disorder, unspecified: Secondary | ICD-10-CM | POA: Diagnosis not present

## 2015-12-25 DIAGNOSIS — D509 Iron deficiency anemia, unspecified: Secondary | ICD-10-CM | POA: Diagnosis not present

## 2015-12-25 DIAGNOSIS — F17210 Nicotine dependence, cigarettes, uncomplicated: Secondary | ICD-10-CM | POA: Insufficient documentation

## 2015-12-25 DIAGNOSIS — E1151 Type 2 diabetes mellitus with diabetic peripheral angiopathy without gangrene: Secondary | ICD-10-CM | POA: Insufficient documentation

## 2015-12-25 DIAGNOSIS — K31819 Angiodysplasia of stomach and duodenum without bleeding: Secondary | ICD-10-CM | POA: Diagnosis not present

## 2015-12-25 DIAGNOSIS — E876 Hypokalemia: Secondary | ICD-10-CM | POA: Insufficient documentation

## 2015-12-25 DIAGNOSIS — D128 Benign neoplasm of rectum: Secondary | ICD-10-CM | POA: Diagnosis not present

## 2015-12-25 DIAGNOSIS — Z79899 Other long term (current) drug therapy: Secondary | ICD-10-CM | POA: Insufficient documentation

## 2015-12-25 DIAGNOSIS — M1990 Unspecified osteoarthritis, unspecified site: Secondary | ICD-10-CM | POA: Diagnosis not present

## 2015-12-25 DIAGNOSIS — Z7982 Long term (current) use of aspirin: Secondary | ICD-10-CM | POA: Diagnosis not present

## 2015-12-25 DIAGNOSIS — Z86010 Personal history of colonic polyps: Secondary | ICD-10-CM | POA: Insufficient documentation

## 2015-12-25 DIAGNOSIS — M549 Dorsalgia, unspecified: Secondary | ICD-10-CM | POA: Insufficient documentation

## 2015-12-25 DIAGNOSIS — D649 Anemia, unspecified: Secondary | ICD-10-CM | POA: Diagnosis not present

## 2015-12-25 LAB — CBC
HCT: 29 % — ABNORMAL LOW (ref 36.0–46.0)
HEMOGLOBIN: 8.3 g/dL — AB (ref 12.0–15.0)
MCH: 22 pg — ABNORMAL LOW (ref 26.0–34.0)
MCHC: 28.6 g/dL — ABNORMAL LOW (ref 30.0–36.0)
MCV: 76.9 fL — AB (ref 78.0–100.0)
PLATELETS: 540 10*3/uL — AB (ref 150–400)
RBC: 3.77 MIL/uL — AB (ref 3.87–5.11)
RDW: 15.4 % (ref 11.5–15.5)
WBC: 11 10*3/uL — ABNORMAL HIGH (ref 4.0–10.5)

## 2015-12-25 LAB — CBG MONITORING, ED: GLUCOSE-CAPILLARY: 150 mg/dL — AB (ref 65–99)

## 2015-12-25 LAB — BASIC METABOLIC PANEL
ANION GAP: 8 (ref 5–15)
BUN: 12 mg/dL (ref 6–20)
CHLORIDE: 109 mmol/L (ref 101–111)
CO2: 25 mmol/L (ref 22–32)
Calcium: 9.7 mg/dL (ref 8.9–10.3)
Creatinine, Ser: 0.73 mg/dL (ref 0.44–1.00)
GFR calc non Af Amer: >60 mL/min (ref >60)
Glucose, Bld: 140 mg/dL — ABNORMAL HIGH (ref 65–99)
Potassium: 3.4 mmol/L — ABNORMAL LOW (ref 3.5–5.1)
SODIUM: 142 mmol/L (ref 135–145)

## 2015-12-25 LAB — URINALYSIS, ROUTINE W REFLEX MICROSCOPIC
BILIRUBIN URINE: NEGATIVE
Glucose, UA: NEGATIVE mg/dL
KETONES UR: NEGATIVE mg/dL
LEUKOCYTES UA: NEGATIVE
Nitrite: NEGATIVE
Protein, ur: NEGATIVE mg/dL
Specific Gravity, Urine: 1.028 (ref 1.005–1.030)
pH: 5.5 (ref 5.0–8.0)

## 2015-12-25 LAB — URINE MICROSCOPIC-ADD ON

## 2015-12-25 LAB — I-STAT TROPONIN, ED: Troponin i, poc: 0 ng/mL (ref 0.00–0.08)

## 2015-12-25 NOTE — ED Notes (Signed)
Pt with vague symptoms. Pt states "I feel weak." Pt states pain in chest "on my way over here." Denies any nausea, vomiting or diarrhea. Pt states "I might have had a fever earlier, I don't know." Pt endorses lightheadedness.

## 2015-12-26 ENCOUNTER — Encounter (HOSPITAL_COMMUNITY): Payer: Self-pay | Admitting: Family Medicine

## 2015-12-26 ENCOUNTER — Observation Stay (HOSPITAL_COMMUNITY)
Admission: EM | Admit: 2015-12-26 | Discharge: 2015-12-29 | Disposition: A | Discharge status: Home / Self Care | Payer: Medicare Other | Attending: Internal Medicine | Admitting: Internal Medicine

## 2015-12-26 DIAGNOSIS — E876 Hypokalemia: Secondary | ICD-10-CM | POA: Diagnosis not present

## 2015-12-26 DIAGNOSIS — E1151 Type 2 diabetes mellitus with diabetic peripheral angiopathy without gangrene: Secondary | ICD-10-CM | POA: Diagnosis not present

## 2015-12-26 DIAGNOSIS — I10 Essential (primary) hypertension: Secondary | ICD-10-CM | POA: Diagnosis not present

## 2015-12-26 DIAGNOSIS — D649 Anemia, unspecified: Secondary | ICD-10-CM | POA: Diagnosis present

## 2015-12-26 DIAGNOSIS — D509 Iron deficiency anemia, unspecified: Secondary | ICD-10-CM | POA: Diagnosis not present

## 2015-12-26 DIAGNOSIS — E119 Type 2 diabetes mellitus without complications: Secondary | ICD-10-CM | POA: Diagnosis present

## 2015-12-26 HISTORY — DX: Anemia, unspecified: D64.9

## 2015-12-26 LAB — VITAMIN B12: Vitamin B-12: 315 pg/mL (ref 180–914)

## 2015-12-26 LAB — CBC
HEMATOCRIT: 26.7 % — AB (ref 36.0–46.0)
Hemoglobin: 7.6 g/dL — ABNORMAL LOW (ref 12.0–15.0)
MCH: 22.2 pg — ABNORMAL LOW (ref 26.0–34.0)
MCHC: 28.5 g/dL — ABNORMAL LOW (ref 30.0–36.0)
MCV: 78.1 fL (ref 78.0–100.0)
PLATELETS: 470 10*3/uL — AB (ref 150–400)
RBC: 3.42 MIL/uL — AB (ref 3.87–5.11)
RDW: 15.5 % (ref 11.5–15.5)
WBC: 8.6 10*3/uL (ref 4.0–10.5)

## 2015-12-26 LAB — COMPREHENSIVE METABOLIC PANEL
ALBUMIN: 3.1 g/dL — AB (ref 3.5–5.0)
ALK PHOS: 85 U/L (ref 38–126)
ALT: 10 U/L — AB (ref 14–54)
AST: 15 U/L (ref 15–41)
Anion gap: 5 (ref 5–15)
BUN: 11 mg/dL (ref 6–20)
CHLORIDE: 111 mmol/L (ref 101–111)
CO2: 23 mmol/L (ref 22–32)
CREATININE: 0.64 mg/dL (ref 0.44–1.00)
Calcium: 8.8 mg/dL — ABNORMAL LOW (ref 8.9–10.3)
GFR calc Af Amer: >60 mL/min (ref >60)
GFR calc non Af Amer: >60 mL/min (ref >60)
Glucose, Bld: 136 mg/dL — ABNORMAL HIGH (ref 65–99)
Potassium: 3.7 mmol/L (ref 3.5–5.1)
SODIUM: 139 mmol/L (ref 135–145)
Total Bilirubin: 0.2 mg/dL — ABNORMAL LOW (ref 0.3–1.2)
Total Protein: 6.5 g/dL (ref 6.5–8.1)

## 2015-12-26 LAB — LACTATE DEHYDROGENASE: LDH: 133 U/L (ref 98–192)

## 2015-12-26 LAB — FERRITIN: FERRITIN: 6 ng/mL — AB (ref 11–307)

## 2015-12-26 LAB — GLUCOSE, CAPILLARY
GLUCOSE-CAPILLARY: 112 mg/dL — AB (ref 65–99)
GLUCOSE-CAPILLARY: 101 mg/dL — AB (ref 65–99)
Glucose-Capillary: 69 mg/dL (ref 65–99)
Glucose-Capillary: 99 mg/dL (ref 65–99)
Glucose-Capillary: 110 mg/dL — ABNORMAL HIGH (ref 65–99)

## 2015-12-26 LAB — HEMOGLOBIN AND HEMATOCRIT, BLOOD
HEMATOCRIT: 30.2 % — AB (ref 36.0–46.0)
HEMOGLOBIN: 9 g/dL — AB (ref 12.0–15.0)

## 2015-12-26 LAB — IRON AND TIBC
IRON: 21 ug/dL — AB (ref 28–170)
Saturation Ratios: 4 % — ABNORMAL LOW (ref 10.4–31.8)
TIBC: 486 ug/dL — AB (ref 250–450)
UIBC: 465 ug/dL

## 2015-12-26 LAB — RETICULOCYTES
RBC.: 3.64 MIL/uL — ABNORMAL LOW (ref 3.87–5.11)
Retic Count, Absolute: 87.4 10*3/uL (ref 19.0–186.0)
Retic Ct Pct: 2.4 % (ref 0.4–3.1)

## 2015-12-26 LAB — ABO/RH: ABO/RH(D): O POS

## 2015-12-26 LAB — PREPARE RBC (CROSSMATCH)

## 2015-12-26 LAB — POC OCCULT BLOOD, ED: Fecal Occult Bld: POSITIVE — AB

## 2015-12-26 LAB — MAGNESIUM: Magnesium: 1.7 mg/dL (ref 1.7–2.4)

## 2015-12-26 MED ORDER — MAGNESIUM SULFATE 50 % IJ SOLN
3.0000 g | Freq: Once | INTRAVENOUS | Status: AC
  Administered 2015-12-26: 3 g via INTRAVENOUS
  Filled 2015-12-26: qty 6

## 2015-12-26 MED ORDER — INSULIN ASPART 100 UNIT/ML ~~LOC~~ SOLN: 0.0000 [IU] | Freq: Three times a day (TID) | SUBCUTANEOUS | Status: DC

## 2015-12-26 MED ORDER — POTASSIUM CHLORIDE IN NACL 20-0.9 MEQ/L-% IV SOLN
INTRAVENOUS | Status: DC
  Administered 2015-12-26: 20:00:00 via INTRAVENOUS
  Administered 2015-12-26: 125 mL/h via INTRAVENOUS
  Administered 2015-12-26 – 2015-12-28 (×3): via INTRAVENOUS
  Administered 2015-12-28: 1000 mL via INTRAVENOUS
  Administered 2015-12-29: 01:00:00 via INTRAVENOUS
  Administered 2015-12-29: 1000 mL via INTRAVENOUS
  Filled 2015-12-26 (×13): qty 1000

## 2015-12-26 MED ORDER — ACETAMINOPHEN 325 MG PO TABS
650.0000 mg | ORAL_TABLET | Freq: Four times a day (QID) | ORAL | Status: DC | PRN
  Administered 2015-12-26 (×2): 650 mg via ORAL
  Filled 2015-12-26 (×2): qty 2

## 2015-12-26 MED ORDER — ATORVASTATIN CALCIUM 40 MG PO TABS
40.0000 mg | ORAL_TABLET | Freq: Every day | ORAL | Status: DC
  Administered 2015-12-26 – 2015-12-29 (×4): 40 mg via ORAL
  Filled 2015-12-26 (×4): qty 1

## 2015-12-26 MED ORDER — AMLODIPINE BESYLATE 10 MG PO TABS
10.0000 mg | ORAL_TABLET | Freq: Every day | ORAL | Status: DC
  Administered 2015-12-26 – 2015-12-29 (×4): 10 mg via ORAL
  Filled 2015-12-26 (×4): qty 1

## 2015-12-26 MED ORDER — SODIUM CHLORIDE 0.9 % IV SOLN
Freq: Once | INTRAVENOUS | Status: AC
  Administered 2015-12-26: 250 mL via INTRAVENOUS

## 2015-12-26 MED ORDER — ACETAMINOPHEN 650 MG RE SUPP: 650.0000 mg | Freq: Four times a day (QID) | RECTAL | Status: DC | PRN

## 2015-12-26 MED ORDER — INSULIN ASPART 100 UNIT/ML ~~LOC~~ SOLN: 0.0000 [IU] | Freq: Every day | SUBCUTANEOUS | Status: DC

## 2015-12-26 NOTE — Care Management Obs Status (Signed)
[place] NOTIFICATION   Patient Details  Name: [name] MRN: [id number] Date of Birth: March 17, 1951   Medicare Observation Status Notification Given:  Yes    Sharin Mons, RN 12/26/2015, 1:44 PM

## 2015-12-26 NOTE — Progress Notes (Signed)
Received report from [place].

## 2015-12-26 NOTE — ED Notes (Signed)
Attempted to call report

## 2015-12-26 NOTE — Progress Notes (Signed)
I have seen and assessed patient and agree with Dr. Sabino Niemann assessment and plan. Patient is a pleasant 65 year old female history of hypertension, diabetes, prior history of GI bleed in the setting of Plavix from AVMs presented to the ED with fatigue, malaise, worsening shortness of breath. FOBT was positive. Patient noted to have a hemoglobin of 8.3. It was felt patient likely had a symptomatic anemia. Patient placed on clear liquids, aspirin held and patient transfuse a unit of packed red blood cells. GI has been consulted, Dr. Benson Norway to assess the patient in the morning.

## 2015-12-26 NOTE — H&P (Signed)
History and Physical  Patient Name: [name]     ERX:[id number]    DOB: 05/10/1951    DOA: 12/26/2015 PCP: Benito Mccreedy, MD   Patient coming from: Home  Chief Complaint: Fatigue, shortness of breath  HPI: [name] is a 65 y.o. female with a past medical history significant for HTN, NIDDM, and prior GI bleed in the setting of Plavix from AVMs who presents with shortness of breath for several days.  The patient was in her usual state of health until about a week ago when she started to develop dizziness and "not feeling right". In the last few days this has progressed to become more constant and she has had shortness of breath with exertion, so today she came to the ER.   ED course: -Afebrile, hemodynamically stable, BP 149/64, saturating well on room air -Na 142, K 3.4, Cr 0.7 (baseline), WBC 11K, Hgb 8.3 (microcytic, previously 13, one episode in 2014 dropped as low as 7) -FOBT positive -TRH were asked to evaluate for admission   She had an episode of melena in 2014 while taking Plavix, scope at that time by Dr. Benson Norway showed AVMs in duodenum which were oozing (also polyps and diverticular disease).  She is no longer taking Plavix or other blood thinners other than baby aspirin.  She denies NSAID use or alcohol.  She denies recent melena, hematochezia, epistaxis, vaginal bleeding, or hemetemesis.      ROS: Pt complains of shortness of breath, malaise, dizziness, runny nose, cough, not feeling right.  Pt denies any melena, hematochezia, epistaxis, vaginal bleeding, hematemesis, wheezing, sputum production.    All other systems negative except as just noted or noted in the history of present illness.    Past Medical History  Diagnosis Date  . Hypertension   . Hyperlipemia   . Chronic pain   . Anxiety   . Depression   . GERD (gastroesophageal reflux disease)   . Diabetes mellitus without complication ([place])   . COPD (chronic obstructive pulmonary disease) ([place])   .  Heart murmur   . Arthritis   . Deep vein blood clot of right lower extremity ([place]) 12/29/2012    Dr. Nadyne Coombes, stent in place  . Sleep apnea     uses a cpap  . Wears glasses     Past Surgical History  Procedure Laterality Date  . Cesarean section    . Uterine fibroid surgery    . Hand surgery    . Abdominal hysterectomy    . Stent in right leg  12/29/2012    for blood clot, Dr. Nadyne Coombes  . Esophagogastroduodenoscopy N/A 01/08/2013    Procedure: ESOPHAGOGASTRODUODENOSCOPY (EGD);  Surgeon: Beryle Beams, MD;  Location: [facility];  Service: Endoscopy;  Laterality: N/A;  . Colonoscopy N/A 01/08/2013    Procedure: COLONOSCOPY;  Surgeon: Beryle Beams, MD;  Location: [facility];  Service: Endoscopy;  Laterality: N/A;  . Cardiac catheterization  4/15  . Tympanomastoidectomy Left 03/08/2014    Procedure: TYMPANOMASTOIDECTOMY LEFT;  Surgeon: Ascencion Dike, MD;  Location: [place];  Service: ENT;  Laterality: Left;  . Lower extremity angiogram N/A 12/29/2012    Procedure: LOWER EXTREMITY ANGIOGRAM;  Surgeon: Laverda Page, MD;  Location: [facility] CATH LAB;  Service: Cardiovascular;  Laterality: N/A;  . Left heart catheterization with coronary angiogram N/A 03/09/2013    Procedure: LEFT HEART CATHETERIZATION WITH CORONARY ANGIOGRAM;  Surgeon: Laverda Page, MD;  Location: [facility] CATH LAB;  Service: Cardiovascular;  Laterality: N/A;  . Lower extremity angiogram N/A 10/05/2013    Procedure: LOWER EXTREMITY ANGIOGRAM;  Surgeon: Laverda Page, MD;  Location: [facility] C[facility] LAB;  Service: Cardiovascular;  Laterality: N/A;    Social History: Patient lives Alone.  The patient walks unassisted. She does not use alcohol. She smokes.    No Known Allergies  Family history: family history includes Diabetes in her mother; Heart disease in her father and mother; Hypertension in her father and mother.  Prior to Admission medications   Medication Sig Start Date End Date Taking? Authorizing Provider    amLODipine (NORVASC) 10 MG tablet Take 1 tablet (10 mg total) by mouth daily. 11/23/13  Yes Milton Ferguson, MD  aspirin EC 81 MG tablet Take 81 mg by mouth daily.   Yes Historical Provider, MD  atorvastatin (LIPITOR) 40 MG tablet Take 40 mg by mouth daily. 09/08/13  Yes Historical Provider, MD  metFORMIN (GLUCOPHAGE) 500 MG tablet Take 0.5 tablets (250 mg total) by mouth 2 (two) times daily with a meal. 10/07/13  Yes Adrian Prows, MD  nitroGLYCERIN (NITROSTAT) 0.4 MG SL tablet Place 0.4 mg under the tongue every 5 (five) minutes as needed for chest pain.   Yes Historical Provider, MD  amoxicillin (AMOXIL) 875 MG tablet Take 1 tablet (875 mg total) by mouth 2 (two) times daily. Patient not taking: Reported on 12/26/2015 03/08/14   Leta Baptist, MD  cyclobenzaprine (FLEXERIL) 10 MG tablet Take 1 tablet (10 mg total) by mouth 2 (two) times daily as needed for muscle spasms. Patient not taking: Reported on 12/26/2015 09/18/14   Glendell Docker, NP  oxyCODONE-acetaminophen (ROXICET) 5-325 MG per tablet Take 1 tablet by mouth every 4 (four) hours as needed for moderate pain or severe pain. Patient not taking: Reported on 12/26/2015 03/08/14   Leta Baptist, MD       Physical Exam: BP 158/66 mmHg  Pulse 64  Temp(Src) 98.2 F (36.8 C) (Oral)  Resp 19  Ht '4\' 11"'$  (1.499 m)  Wt 93.4 kg (205 lb 14.6 oz)  BMI 41.57 kg/m2  SpO2 100% General appearance: Well-developed, obese  female, alert and in no acute distress.   Eyes: Conjunctiva normal, lids and lashes normal.   PERRL.  ENT: No nasal deformity, discharge.  OP moist without lesions.   Lymph: No cervical or supraclavicular lymphadenopathy. Skin: Warm and dry.  No suspicious rashes or lesions. Cardiac: RRR, nl S1-S2, no murmurs appreciated.  Capillary refill is brisk.  JVP normal.  No LE edema.  Radial and DP pulses 2+ and symmetric. Respiratory: Normal respiratory rate and rhythm.  CTAB without rales or wheezes. GI: Abdomen soft without rigidity.  No TTP. No ascites,  distension, hepatosplenomegaly.   MSK: No deformities or effusions.  No clubbing/cyanosis. Neuro: Cranial nerevs normal.  Sensorium intact and responding to questions, attention normal.  Speech is fluent.  Moves all extremities equally and with normal coordination.    Psych: Affect tearful.  Judgment and insight appear normal.       Labs on Admission:  I have personally reviewed following labs and imaging studies: CBC:  Recent Labs Lab 12/25/15 2254  WBC 11.0*  HGB 8.3*  HCT 29.0*  MCV 76.9*  PLT 841*   Basic Metabolic Panel:  Recent Labs Lab 12/25/15 2254  NA 142  K 3.4*  CL 109  CO2 25  GLUCOSE 140*  BUN 12  CREATININE 0.73  CALCIUM 9.7   GFR: Estimated Creatinine Clearance: 70.1 mL/min (by C-G formula based on  Cr of 0.73).  Liver Function Tests: No results for input(s): AST, ALT, ALKPHOS, BILITOT, PROT, ALBUMIN in the last 168 hours. No results for input(s): LIPASE, AMYLASE in the last 168 hours. No results for input(s): AMMONIA in the last 168 hours. Coagulation Profile: No results for input(s): INR, PROTIME in the last 168 hours. Cardiac Enzymes: No results for input(s): CKTOTAL, CKMB, CKMBINDEX, TROPONINI in the last 168 hours. BNP (last 3 results) No results for input(s): PROBNP in the last 8760 hours. HbA1C: No results for input(s): HGBA1C in the last 72 hours. CBG:  Recent Labs Lab 12/25/15 2245  GLUCAP 150*   Lipid Profile: No results for input(s): CHOL, HDL, LDLCALC, TRIG, CHOLHDL, LDLDIRECT in the last 72 hours. Thyroid Function Tests: No results for input(s): TSH, T4TOTAL, FREET4, T3FREE, THYROIDAB in the last 72 hours. Anemia Panel: No results for input(s): VITAMINB12, FOLATE, FERRITIN, TIBC, IRON, RETICCTPCT in the last 72 hours. Sepsis Labs:      Radiological Exams on Admission: Personally reviewed: Dg Chest 2 View  12/25/2015  CLINICAL DATA:  Central chest pain onset tonight. EXAM: CHEST  2 VIEW COMPARISON:  Chest CT 09/07/2014  radiographs 11/29/2013 FINDINGS: Mild cardiomegaly is unchanged. Mediastinal contours are unchanged, there is atherosclerosis of the thoracic aorta. Linear scarring in both lung bases, unchanged from prior CT. No pulmonary edema, focal consolidation, pleural effusion or pneumothorax. Degenerative change in the spine. IMPRESSION: 1. Stable mild cardiomegaly. Stable linear scarring in the lung bases. 2. Aortic atherosclerosis. Electronically Signed   By: Jeb Levering M.D.   On: 12/25/2015 23:21    EKG: Independently reviewed. Rate 73, QTc 431.    Assessment/Plan Principal Problem:   Symptomatic anemia Active Problems:   HTN (hypertension)   Hypokalemia   Type 2 diabetes mellitus with peripheral vascular disease ([place])  1. Symptomatic anemia:  Microcytic.  Suspect this is a chronic GI bleed from her known AVMs.  However, differential includes RBC destruction and hypoproduction at this point. -Clears and MIVF -Consult to GI, appreciate cares -Hold aspirin for now -Transfuse 1 unit now -Check LDH, haptoglobin for lysis -Check iron stores -Check B12 and folate -Check reticulocytes   2. HTN:  -Continue amlodipine and statin  3. NIDDM:  -Hold metfromin  -SSI while in hospital  4. Hypokalemia: -MIVF with K  5. Back pain: -Continue gabapentin    DVT prophylaxis: SCDs  Code Status: FULL  Family Communication: None present  Disposition Plan: Anticipate studies and transfusion and consultation with GI Consults called: None overnight Admission status: OBS, med surg At the point of initial evaluation, it is my clinical opinion that admission for OBSERVATION is reasonable and necessary because the patient's presenting complaints in the context of their chronic conditions represent sufficient risk of deterioration or significant morbidity to constitute reasonable grounds for close observation in the hospital setting, but that the patient may be medically stable for discharge from  the hospital within 24 to 48 hours.    Medical decision making: Patient seen at 4:18 AM on 12/26/2015.  The patient was discussed with Dr. Claudine Mouton. What exists of the patient's chart was reviewed in depth.  Clinical condition: stable.        Edwin Dada [facility] Pager [phone]

## 2015-12-26 NOTE — ED Provider Notes (Signed)
CSN: [id number]     Arrival date & time 12/25/15  2237 History  By signing my name below, I, [facility], attest that this documentation has been prepared under the direction and in the presence of Everlene Balls, MD. Electronically Signed: Virgel Bouquet, ED Scribe. 12/26/2015. 2:45 AM.   Chief Complaint  Patient presents with  . Chest Pain  . Dizziness     The history is provided by the patient. No language interpreter was used.   HPI Comments: [name] is a 65 y.o. female with a hx of HTN, HLN, DM, COPD, heart murmur, and DVTwho presents to the Emergency Department complaining of intermittent, moderate dizziness onset last night. Pt states that she ate at [place] last night and returned home where she became diaphoretic, dizzy, fatigued, and felt "weird". She reports a productive cough and SOB while ambulating into the ED. Per pt, she has an appointment next week for colonoscopy. Denies vomiting, diarrhea, leg edema different from baseline, melena, hematochezia, vaginal bleeding, or any other symptoms currently.  Past Medical History  Diagnosis Date  . Hypertension   . Hyperlipemia   . Chronic pain   . Anxiety   . Depression   . GERD (gastroesophageal reflux disease)   . Diabetes mellitus without complication ([place])   . COPD (chronic obstructive pulmonary disease) ([place])   . Heart murmur   . Arthritis   . Deep vein blood clot of right lower extremity ([place]) 12/29/2012    Dr. Nadyne Coombes, stent in place  . Sleep apnea     uses a cpap  . Wears glasses    Past Surgical History  Procedure Laterality Date  . Cesarean section    . Uterine fibroid surgery    . Hand surgery    . Abdominal hysterectomy    . Stent in right leg  12/29/2012    for blood clot, Dr. Nadyne Coombes  . Esophagogastroduodenoscopy N/A 01/08/2013    Procedure: ESOPHAGOGASTRODUODENOSCOPY (EGD);  Surgeon: Beryle Beams, MD;  Location: [facility];  Service: Endoscopy;  Laterality: N/A;  . Colonoscopy N/A 01/08/2013     Procedure: COLONOSCOPY;  Surgeon: Beryle Beams, MD;  Location: [facility];  Service: Endoscopy;  Laterality: N/A;  . Cardiac catheterization  4/15  . Tympanomastoidectomy Left 03/08/2014    Procedure: TYMPANOMASTOIDECTOMY LEFT;  Surgeon: Ascencion Dike, MD;  Location: [place];  Service: ENT;  Laterality: Left;  . Lower extremity angiogram N/A 12/29/2012    Procedure: LOWER EXTREMITY ANGIOGRAM;  Surgeon: Laverda Page, MD;  Location: [facility] CATH LAB;  Service: Cardiovascular;  Laterality: N/A;  . Left heart catheterization with coronary angiogram N/A 03/09/2013    Procedure: LEFT HEART CATHETERIZATION WITH CORONARY ANGIOGRAM;  Surgeon: Laverda Page, MD;  Location: [facility] CATH LAB;  Service: Cardiovascular;  Laterality: N/A;  . Lower extremity angiogram N/A 10/05/2013    Procedure: LOWER EXTREMITY ANGIOGRAM;  Surgeon: Laverda Page, MD;  Location: [facility] CATH LAB;  Service: Cardiovascular;  Laterality: N/A;   Family History  Problem Relation Age of Onset  . Diabetes Mother   . Hypertension Mother   . Heart disease Mother   . Heart disease Father   . Hypertension Father    Social History  Substance Use Topics  . Smoking status: Current Every Day Smoker -- 0.25 packs/day for 41 years    Types: Cigarettes  . Smokeless tobacco: Never Used  . Alcohol Use: No   OB History    No data available  Review of Systems A complete 10 system review of systems was obtained and all systems are negative except as noted in the HPI and PMH.    Allergies  Review of patient's allergies indicates no known allergies.  Home Medications   Prior to Admission medications   Medication Sig Start Date End Date Taking? Authorizing Provider  amLODipine (NORVASC) 10 MG tablet Take 1 tablet (10 mg total) by mouth daily. 11/23/13  Yes Milton Ferguson, MD  aspirin EC 81 MG tablet Take 81 mg by mouth daily.   Yes Historical Provider, MD  atorvastatin (LIPITOR) 40 MG tablet Take 40 mg by mouth  daily. 09/08/13  Yes Historical Provider, MD  metFORMIN (GLUCOPHAGE) 500 MG tablet Take 0.5 tablets (250 mg total) by mouth 2 (two) times daily with a meal. 10/07/13  Yes Adrian Prows, MD  nitroGLYCERIN (NITROSTAT) 0.4 MG SL tablet Place 0.4 mg under the tongue every 5 (five) minutes as needed for chest pain.   Yes Historical Provider, MD  amoxicillin (AMOXIL) 875 MG tablet Take 1 tablet (875 mg total) by mouth 2 (two) times daily. Patient not taking: Reported on 12/26/2015 03/08/14   Leta Baptist, MD  cyclobenzaprine (FLEXERIL) 10 MG tablet Take 1 tablet (10 mg total) by mouth 2 (two) times daily as needed for muscle spasms. Patient not taking: Reported on 12/26/2015 09/18/14   Glendell Docker, NP  oxyCODONE-acetaminophen (ROXICET) 5-325 MG per tablet Take 1 tablet by mouth every 4 (four) hours as needed for moderate pain or severe pain. Patient not taking: Reported on 12/26/2015 03/08/14   Leta Baptist, MD   BP 158/71 mmHg  Pulse 66  Temp(Src) 98.2 F (36.8 C) (Oral)  Resp 17  Ht '4\' 11"'$  (1.499 m)  Wt 208 lb 12.8 oz (94.711 kg)  BMI 42.15 kg/m2  SpO2 97% Physical Exam  Constitutional: She is oriented to person, place, and time. She appears well-developed and well-nourished. No distress.  HENT:  Head: Normocephalic and atraumatic.  Nose: Nose normal.  Mouth/Throat: Oropharynx is clear and moist. No oropharyngeal exudate.  Eyes: Conjunctivae and EOM are normal. Pupils are equal, round, and reactive to light. No scleral icterus.  Neck: Normal range of motion. Neck supple. No JVD present. No tracheal deviation present. No thyromegaly present.  Cardiovascular: Normal rate, regular rhythm and normal heart sounds.  Exam reveals no gallop and no friction rub.   No murmur heard. Pulmonary/Chest: Effort normal and breath sounds normal. No respiratory distress. She has no wheezes. She exhibits no tenderness.  Abdominal: Soft. Bowel sounds are normal. She exhibits no distension and no mass. There is no tenderness. There  is no rebound and no guarding.  Genitourinary: Guaiac positive stool.  Musculoskeletal: Normal range of motion. She exhibits no edema or tenderness.  Lymphadenopathy:    She has no cervical adenopathy.  Neurological: She is alert and oriented to person, place, and time. No cranial nerve deficit. She exhibits normal muscle tone.  Skin: Skin is warm and dry. No rash noted. No erythema. No pallor.  Nursing note and vitals reviewed.   ED Course  Procedures (including critical care time)  DIAGNOSTIC STUDIES: Oxygen Saturation is 100% on RA, normal by my interpretation.    COORDINATION OF CARE: 2:01 AM Discussed results of labs. Will perform fecal occult blood test. Discussed treatment plan with pt at bedside and pt agreed to plan.  2:28 AM Reviewed results of fecal occult blood test. Will consult with hospitalist.  2:44 AM Consulted with Dr. Loleta Books who agreed to  admit pt.   Labs Review Labs Reviewed  BASIC METABOLIC PANEL - Abnormal; Notable for the following:    Potassium 3.4 (*)    Glucose, Bld 140 (*)    All other components within normal limits  CBC - Abnormal; Notable for the following:    WBC 11.0 (*)    RBC 3.77 (*)    Hemoglobin 8.3 (*)    HCT 29.0 (*)    MCV 76.9 (*)    MCH 22.0 (*)    MCHC 28.6 (*)    Platelets 540 (*)    All other components within normal limits  URINALYSIS, ROUTINE W REFLEX MICROSCOPIC (NOT AT [facility]) - Abnormal; Notable for the following:    APPearance CLOUDY (*)    Hgb urine dipstick SMALL (*)    All other components within normal limits  URINE MICROSCOPIC-ADD ON - Abnormal; Notable for the following:    Squamous Epithelial / LPF 6-30 (*)    Bacteria, UA FEW (*)    All other components within normal limits  CBG MONITORING, ED - Abnormal; Notable for the following:    Glucose-Capillary 150 (*)    All other components within normal limits  POC OCCULT BLOOD, ED - Abnormal; Notable for the following:    Fecal Occult Bld POSITIVE (*)    All  other components within normal limits  I-STAT TROPOININ, ED  TYPE AND SCREEN    Imaging Review Dg Chest 2 View  12/25/2015  CLINICAL DATA:  Central chest pain onset tonight. EXAM: CHEST  2 VIEW COMPARISON:  Chest CT 09/07/2014 radiographs 11/29/2013 FINDINGS: Mild cardiomegaly is unchanged. Mediastinal contours are unchanged, there is atherosclerosis of the thoracic aorta. Linear scarring in both lung bases, unchanged from prior CT. No pulmonary edema, focal consolidation, pleural effusion or pneumothorax. Degenerative change in the spine. IMPRESSION: 1. Stable mild cardiomegaly. Stable linear scarring in the lung bases. 2. Aortic atherosclerosis. Electronically Signed   By: Jeb Levering M.D.   On: 12/25/2015 23:21   I have personally reviewed and evaluated these images and lab results as part of my medical decision-making.   EKG Interpretation   Date/Time:  Monday December 25 2015 22:55:17 EDT Ventricular Rate:  73 PR Interval:  156 QRS Duration: 96 QT Interval:  392 QTC Calculation: 431 R Axis:   66 Text Interpretation:  Normal sinus rhythm Normal ECG No significant change  since last tracing Confirmed by Glynn Octave [phone]) on 12/26/2015  1:49:12 AM      MDM   Final diagnoses:  None    Patient presents to the ED for weakness, CP, SOB and feeling lightheaded.  Her hgb dropped to 8 and hemoccult is positive.  Will admit for symptomatic anemia.  T&S sent.  Dr. Loleta Books accepts to med srug.   I personally performed the services described in this documentation, which was scribed in my presence. The recorded information has been reviewed and is accurate.     Everlene Balls, MD 12/26/15 [phone]

## 2015-12-27 DIAGNOSIS — I10 Essential (primary) hypertension: Secondary | ICD-10-CM | POA: Diagnosis not present

## 2015-12-27 DIAGNOSIS — K31811 Angiodysplasia of stomach and duodenum with bleeding: Secondary | ICD-10-CM | POA: Diagnosis not present

## 2015-12-27 DIAGNOSIS — E1151 Type 2 diabetes mellitus with diabetic peripheral angiopathy without gangrene: Secondary | ICD-10-CM

## 2015-12-27 DIAGNOSIS — E876 Hypokalemia: Secondary | ICD-10-CM | POA: Diagnosis not present

## 2015-12-27 DIAGNOSIS — Z86010 Personal history of colonic polyps: Secondary | ICD-10-CM | POA: Diagnosis not present

## 2015-12-27 DIAGNOSIS — D500 Iron deficiency anemia secondary to blood loss (chronic): Secondary | ICD-10-CM | POA: Diagnosis not present

## 2015-12-27 DIAGNOSIS — D509 Iron deficiency anemia, unspecified: Secondary | ICD-10-CM | POA: Diagnosis not present

## 2015-12-27 DIAGNOSIS — D649 Anemia, unspecified: Secondary | ICD-10-CM | POA: Diagnosis not present

## 2015-12-27 DIAGNOSIS — R5383 Other fatigue: Secondary | ICD-10-CM | POA: Diagnosis not present

## 2015-12-27 LAB — CBC
HCT: 32.2 % — ABNORMAL LOW (ref 36.0–46.0)
Hemoglobin: 9.6 g/dL — ABNORMAL LOW (ref 12.0–15.0)
MCH: 22.6 pg — AB (ref 26.0–34.0)
MCHC: 29.8 g/dL — AB (ref 30.0–36.0)
MCV: 75.9 fL — AB (ref 78.0–100.0)
PLATELETS: 488 10*3/uL — AB (ref 150–400)
RBC: 4.24 MIL/uL (ref 3.87–5.11)
RDW: 15.4 % (ref 11.5–15.5)
WBC: 10.7 10*3/uL — AB (ref 4.0–10.5)

## 2015-12-27 LAB — COMPREHENSIVE METABOLIC PANEL
ALT: 9 U/L — ABNORMAL LOW (ref 14–54)
ANION GAP: 6 (ref 5–15)
AST: 14 U/L — AB (ref 15–41)
Albumin: 3.4 g/dL — ABNORMAL LOW (ref 3.5–5.0)
Alkaline Phosphatase: 98 U/L (ref 38–126)
BUN: <5 mg/dL — ABNORMAL LOW (ref 6–20)
CHLORIDE: 108 mmol/L (ref 101–111)
CO2: 23 mmol/L (ref 22–32)
Calcium: 9 mg/dL (ref 8.9–10.3)
Creatinine, Ser: 0.61 mg/dL (ref 0.44–1.00)
Glucose, Bld: 98 mg/dL (ref 65–99)
POTASSIUM: 4 mmol/L (ref 3.5–5.1)
Sodium: 137 mmol/L (ref 135–145)
TOTAL PROTEIN: 7.2 g/dL (ref 6.5–8.1)
Total Bilirubin: 0.7 mg/dL (ref 0.3–1.2)

## 2015-12-27 LAB — TYPE AND SCREEN
ABO/RH(D): O POS
Antibody Screen: NEGATIVE
UNIT DIVISION: 0

## 2015-12-27 LAB — GLUCOSE, CAPILLARY
GLUCOSE-CAPILLARY: 108 mg/dL — AB (ref 65–99)
GLUCOSE-CAPILLARY: 99 mg/dL (ref 65–99)
GLUCOSE-CAPILLARY: 84 mg/dL (ref 65–99)
Glucose-Capillary: 99 mg/dL (ref 65–99)

## 2015-12-27 LAB — FOLATE RBC
FOLATE, HEMOLYSATE: 326.8 ng/mL
Folate, RBC: 1219 ng/mL (ref >498)
Hematocrit: 26.8 % — ABNORMAL LOW (ref 34.0–46.6)

## 2015-12-27 LAB — HEMOGLOBIN A1C
HEMOGLOBIN A1C: 5.6 % (ref 4.8–5.6)
MEAN PLASMA GLUCOSE: 114 mg/dL

## 2015-12-27 LAB — HAPTOGLOBIN: HAPTOGLOBIN: 150 mg/dL (ref 34–200)

## 2015-12-27 LAB — MAGNESIUM: MAGNESIUM: 2 mg/dL (ref 1.7–2.4)

## 2015-12-27 MED ORDER — PEG 3350-KCL-NA BICARB-NACL 420 G PO SOLR
4000.0000 mL | Freq: Once | ORAL | Status: DC
  Filled 2015-12-27: qty 4000

## 2015-12-27 MED ORDER — PEG 3350-KCL-NA BICARB-NACL 420 G PO SOLR
4000.0000 mL | Freq: Once | ORAL | Status: AC
Start: 2015-12-27 — End: 2015-12-27
  Administered 2015-12-27: 4000 mL via ORAL
  Filled 2015-12-27: qty 4000

## 2015-12-27 NOTE — Consult Note (Signed)
Reason for Consult: Symptomatic anemia Referring Physician: [facility]  [name] HPI: This is a 65 year old female with a PMH of bleeding proximal small bowel AVMs, colonic polyps (2014), HTN, and DM admitted with complaints of dizziness.  The symptoms continued to progress and she was also short of breath.  In the ER her HGB was noted to be in the 7-8 range and she is feeling better with the blood transfusion.  Her Plavix was stopped approximately 1 year ago.  The EGD in 2014 was significant for bleeding AVMs.  Her colon also had multiple adenomas and she was recommended a 3 year follow up.  Past Medical History  Diagnosis Date  . Hypertension   . Hyperlipemia   . Chronic pain   . Anxiety   . Depression   . GERD (gastroesophageal reflux disease)   . Diabetes mellitus without complication ([place])   . COPD (chronic obstructive pulmonary disease) ([place])   . Heart murmur   . Arthritis   . Deep vein blood clot of right lower extremity ([place]) 12/29/2012    Dr. Nadyne Coombes, stent in place  . Sleep apnea     uses a cpap  . Wears glasses   . Anemia 12/26/2015    Past Surgical History  Procedure Laterality Date  . Cesarean section    . Uterine fibroid surgery    . Hand surgery    . Abdominal hysterectomy    . Stent in right leg  12/29/2012    for blood clot, Dr. Nadyne Coombes  . Esophagogastroduodenoscopy N/A 01/08/2013    Procedure: ESOPHAGOGASTRODUODENOSCOPY (EGD);  Surgeon: Beryle Beams, MD;  Location: [facility];  Service: Endoscopy;  Laterality: N/A;  . Colonoscopy N/A 01/08/2013    Procedure: COLONOSCOPY;  Surgeon: Beryle Beams, MD;  Location: [facility];  Service: Endoscopy;  Laterality: N/A;  . Cardiac catheterization  4/15  . Tympanomastoidectomy Left 03/08/2014    Procedure: TYMPANOMASTOIDECTOMY LEFT;  Surgeon: Ascencion Dike, MD;  Location: [facility];  Service: ENT;  Laterality: Left;  . Lower extremity angiogram N/A 12/29/2012    Procedure: LOWER EXTREMITY  ANGIOGRAM;  Surgeon: Laverda Page, MD;  Location: [facility] CATH LAB;  Service: Cardiovascular;  Laterality: N/A;  . Left heart catheterization with coronary angiogram N/A 03/09/2013    Procedure: LEFT HEART CATHETERIZATION WITH CORONARY ANGIOGRAM;  Surgeon: Laverda Page, MD;  Location: [facility] CATH LAB;  Service: Cardiovascular;  Laterality: N/A;  . Lower extremity angiogram N/A 10/05/2013    Procedure: LOWER EXTREMITY ANGIOGRAM;  Surgeon: Laverda Page, MD;  Location: [facility] CATH LAB;  Service: Cardiovascular;  Laterality: N/A;  . Tympanomastoidectomy  2015    Family History  Problem Relation Age of Onset  . Diabetes Mother   . Hypertension Mother   . Heart disease Mother   . Heart disease Father   . Hypertension Father     Social History:  reports that she has been smoking Cigarettes.  She has a 10.25 pack-year smoking history. She has never used smokeless tobacco. She reports that she uses illicit drugs (Marijuana). She reports that she does not drink alcohol.  Allergies: No Known Allergies  Medications:  Scheduled: . amLODipine  10 mg Oral Daily  . atorvastatin  40 mg Oral Daily  . insulin aspart  0-9 Units Subcutaneous TID WC   Continuous: . 0.9 % NaCl with KCl 20 mEq / L 100 mL/hr at 12/27/15 1207    Results for orders placed or performed during  the hospital encounter of 12/26/15 (from the past 24 hour(s))  Glucose, capillary     Status: Abnormal   Collection Time: 12/26/15  9:41 PM  Result Value Ref Range   Glucose-Capillary 110 (H) 65 - 99 mg/dL  CBC     Status: Abnormal   Collection Time: 12/27/15  7:04 AM  Result Value Ref Range   WBC 10.7 (H) 4.0 - 10.5 K/uL   RBC 4.24 3.87 - 5.11 MIL/uL   Hemoglobin 9.6 (L) 12.0 - 15.0 g/dL   HCT 32.2 (L) 36.0 - 46.0 %   MCV 75.9 (L) 78.0 - 100.0 fL   MCH 22.6 (L) 26.0 - 34.0 pg   MCHC 29.8 (L) 30.0 - 36.0 g/dL   RDW 15.4 11.5 - 15.5 %   Platelets 488 (H) 150 - 400 K/uL  Comprehensive metabolic panel     Status: Abnormal    Collection Time: 12/27/15  7:04 AM  Result Value Ref Range   Sodium 137 135 - 145 mmol/L   Potassium 4.0 3.5 - 5.1 mmol/L   Chloride 108 101 - 111 mmol/L   CO2 23 22 - 32 mmol/L   Glucose, Bld 98 65 - 99 mg/dL   BUN <5 (L) 6 - 20 mg/dL   Creatinine, Ser 0.61 0.44 - 1.00 mg/dL   Calcium 9.0 8.9 - 10.3 mg/dL   Total Protein 7.2 6.5 - 8.1 g/dL   Albumin 3.4 (L) 3.5 - 5.0 g/dL   AST 14 (L) 15 - 41 U/L   ALT 9 (L) 14 - 54 U/L   Alkaline Phosphatase 98 38 - 126 U/L   Total Bilirubin 0.7 0.3 - 1.2 mg/dL   GFR calc non Af Amer >60 >60 mL/min   GFR calc Af Amer >60 >60 mL/min   Anion gap 6 5 - 15  Magnesium     Status: None   Collection Time: 12/27/15  7:04 AM  Result Value Ref Range   Magnesium 2.0 1.7 - 2.4 mg/dL  Glucose, capillary     Status: Abnormal   Collection Time: 12/27/15  8:01 AM  Result Value Ref Range   Glucose-Capillary 108 (H) 65 - 99 mg/dL  Glucose, capillary     Status: None   Collection Time: 12/27/15 11:18 AM  Result Value Ref Range   Glucose-Capillary 99 65 - 99 mg/dL  Glucose, capillary     Status: None   Collection Time: 12/27/15  5:41 PM  Result Value Ref Range   Glucose-Capillary 99 65 - 99 mg/dL     Dg Chest 2 View  12/25/2015  CLINICAL DATA:  Central chest pain onset tonight. EXAM: CHEST  2 VIEW COMPARISON:  Chest CT 09/07/2014 radiographs 11/29/2013 FINDINGS: Mild cardiomegaly is unchanged. Mediastinal contours are unchanged, there is atherosclerosis of the thoracic aorta. Linear scarring in both lung bases, unchanged from prior CT. No pulmonary edema, focal consolidation, pleural effusion or pneumothorax. Degenerative change in the spine. IMPRESSION: 1. Stable mild cardiomegaly. Stable linear scarring in the lung bases. 2. Aortic atherosclerosis. Electronically Signed   By: Jeb Levering M.D.   On: 12/25/2015 23:21    ROS:  As stated above in the HPI otherwise negative.  Blood pressure 156/84, pulse 61, temperature 98.8 F (37.1 C), temperature source  Oral, resp. rate 20, height '4\' 11"'$  (1.499 m), weight 93.4 kg (205 lb 14.6 oz), SpO2 98 %.    PE: Gen: NAD, Alert and Oriented HEENT:  [place]/AT, EOMI Neck: Supple, no LAD Lungs: CTA Bilaterally CV: RRR  without M/G/R ABM: Soft, NTND, +BS Ext: No C/C/E  Assessment/Plan: 1) History of bleeding AVMs. 2) History of colonic polyps. 3) Anemia.   My suspicion with her history is that she has bleeding AVMs again.  I will pursue an enteroscopy and ablate any identifiable lesions.  Plan: 1) Enteroscopy and colonoscopy tomorrow.  Jony Ladnier D 12/27/2015, 6:15 PM

## 2015-12-27 NOTE — Progress Notes (Signed)
[facility]                                                                              Patient Demographics  [name], is a 65 y.o. female, DOB - 11-09-1950, DJM:[id number]  Admit date - 12/26/2015   Admitting Physician Edwin Dada, MD  Outpatient Primary MD for the patient is OSEI-BONSU,GEORGE, MD  Outpatient specialists:   LOS -   days    Chief Complaint  Patient presents with  . Chest Pain  . Dizziness       Brief summary  [name] is a 65 y.o. female with a past medical history significant for HTN, NIDDM, and prior GI bleed in the setting of Plavix from AVMs who presented with shortness of breath for several days. The patient was in her usual state of health until about a week ago when she started to develop dizziness and "not feeling right". In the last few days this has progressed to become more constant and she has had shortness of breath with exertion, so today she came to the ER.  In ED, patient was noted to have hemoglobin of 8.3, microcytic, previously 13, FOBT positive. Patient was admitted for further workup. She had an episode of melena in 2014 while taking Plavix, scope at that time by Dr. Benson Norway showed AVMs in duodenum which were oozing (also polyps and diverticular disease). She is no longer taking Plavix or other blood thinners other than baby aspirin. She denied NSAID use or alcohol. She denied recent melena, hematochezia, epistaxis, vaginal bleeding, or hemetemesis.     Assessment & Plan    Principal Problem:   Symptomatic anemia - Microcytic, patient denies any overt bleeding, possibly chronic GI bleed from the known AVMs. - GI has been consulted, Dr. Geoffry Paradise to see, pending further evaluation - Hold aspirin -  Transfused 1 unit on 7/4, hemoglobin today 9.6. - Anemia panel with severe iron deficiency anemia, ferritin 6, iron 21, % sat 4  Active Problems:   HTN (hypertension) - Currently stable, continue  amlodipine    Hypokalemia - Currently stable    Type 2 diabetes mellitus with peripheral vascular disease ([place]) - Continue sliding scale insulin  Hyperlipidemia Continue Lipitor  Code Status: Full CODE STATUS  DVT Prophylaxis:   SCD's  Family Communication: Discussed in detail with the patient, all imaging results, lab results explained to the patient    Disposition Plan: Awaiting GI evaluation  Time Spent in minutes   25 minutes  Procedures:  None  Consultants:   GI  Antimicrobials:   None   Medications  Scheduled Meds: . amLODipine  10 mg Oral Daily  . atorvastatin  40 mg Oral Daily  . insulin aspart  0-9 Units Subcutaneous TID WC   Continuous Infusions: . 0.9 % NaCl with KCl 20 mEq / L 125 mL/hr at 12/27/15 0635   PRN Meds:.acetaminophen **OR** acetaminophen   Antibiotics   Anti-infectives    None        Subjective:   [name] was seen and examined today. Patient denies dizziness, chest pain, abdominal pain, N/V/D/C, new weakness, numbess, tingling. No  acute events overnight.  Fatigue and shortness of breath slightly better.  Objective:   Filed Vitals:   12/26/15 1348 12/26/15 2144 12/27/15 0520 12/27/15 0840  BP: 157/81 126/65 123/88 141/69  Pulse: 59 62 64 57  Temp:  98.6 F (37 C) 98.2 F (36.8 C)   TempSrc:  Oral Oral   Resp: '19 18 18   '$ Height:      Weight:      SpO2: 100% 97% 98%     Intake/Output Summary (Last 24 hours) at 12/27/15 1131 Last data filed at 12/27/15 0900  Gross per 24 hour  Intake 1741.5 ml  Output   4350 ml  Net -2608.5 ml     Wt Readings from Last 3 Encounters:  12/26/15 93.4 kg (205 lb 14.6 oz)  09/18/14 95.255 kg (210 lb)  03/08/14 95.255 kg (210 lb)     Exam  General: Alert and oriented x 3, NAD  HEENT:  PERRLA, EOMI, Anicteric Sclera, mucous membranes moist.   Neck: Supple, no JVD, no masses  Cardiovascular: S1 S2 auscultated, no rubs, murmurs or gallops. Regular rate and  rhythm.  Respiratory: Clear to auscultation bilaterally, no wheezing, rales or rhonchi  Gastrointestinal: Soft, nontender, nondistended, + bowel sounds  Ext: no cyanosis clubbing or edema  Neuro: AAOx3, Cr N's II- XII. Strength 5/5 upper and lower extremities bilaterally  Skin: No rashes  Psych: Normal affect and demeanor, alert and oriented x3    Data Reviewed:  I have personally reviewed following labs and imaging studies  Micro Results No results found for this or any previous visit (from the past 240 hour(s)).  Radiology Reports Dg Chest 2 View  12/25/2015  CLINICAL DATA:  Central chest pain onset tonight. EXAM: CHEST  2 VIEW COMPARISON:  Chest CT 09/07/2014 radiographs 11/29/2013 FINDINGS: Mild cardiomegaly is unchanged. Mediastinal contours are unchanged, there is atherosclerosis of the thoracic aorta. Linear scarring in both lung bases, unchanged from prior CT. No pulmonary edema, focal consolidation, pleural effusion or pneumothorax. Degenerative change in the spine. IMPRESSION: 1. Stable mild cardiomegaly. Stable linear scarring in the lung bases. 2. Aortic atherosclerosis. Electronically Signed   By: Jeb Levering M.D.   On: 12/25/2015 23:21   Mm Digital Screening Bilateral  12/14/2015  CLINICAL DATA:  Screening. EXAM: DIGITAL SCREENING BILATERAL MAMMOGRAM WITH CAD COMPARISON:  Previous exam(s). ACR Breast Density Category b: There are scattered areas of fibroglandular density. FINDINGS: There are no findings suspicious for malignancy. Images were processed with CAD. IMPRESSION: No mammographic evidence of malignancy. A result letter of this screening mammogram will be mailed directly to the patient. RECOMMENDATION: Screening mammogram in one year. (Code:SM-B-01Y) BI-RADS CATEGORY  1: Negative. Electronically Signed   By: Lajean Manes M.D.   On: 12/14/2015 09:13    Lab Data:  CBC:  Recent Labs Lab 12/25/15 2254 12/26/15 0849 12/26/15 1546 12/27/15 0704  WBC 11.0*  8.6  --  10.7*  HGB 8.3* 7.6* 9.0* 9.6*  HCT 29.0* 26.7* 30.2* 32.2*  MCV 76.9* 78.1  --  75.9*  PLT 540* 470*  --  720*   Basic Metabolic Panel:  Recent Labs Lab 12/25/15 2254 12/26/15 0849 12/27/15 0704  NA 142 139 137  K 3.4* 3.7 4.0  CL 109 111 108  CO2 '25 23 23  '$ GLUCOSE 140* 136* 98  BUN 12 11 <5*  CREATININE 0.73 0.64 0.61  CALCIUM 9.7 8.8* 9.0  MG  --  1.7 2.0   GFR: Estimated Creatinine Clearance: 70.1 mL/min (by  C-G formula based on Cr of 0.61). Liver Function Tests:  Recent Labs Lab 12/26/15 0849 12/27/15 0704  AST 15 14*  ALT 10* 9*  ALKPHOS 85 98  BILITOT 0.2* 0.7  PROT 6.5 7.2  ALBUMIN 3.1* 3.4*   No results for input(s): LIPASE, AMYLASE in the last 168 hours. No results for input(s): AMMONIA in the last 168 hours. Coagulation Profile: No results for input(s): INR, PROTIME in the last 168 hours. Cardiac Enzymes: No results for input(s): CKTOTAL, CKMB, CKMBINDEX, TROPONINI in the last 168 hours. BNP (last 3 results) No results for input(s): PROBNP in the last 8760 hours. HbA1C:  Recent Labs  12/26/15 0849  HGBA1C 5.6   CBG:  Recent Labs Lab 12/26/15 1139 12/26/15 1737 12/26/15 1813 12/26/15 2141 12/27/15 0801  GLUCAP 101* 69 99 110* 108*   Lipid Profile: No results for input(s): CHOL, HDL, LDLCALC, TRIG, CHOLHDL, LDLDIRECT in the last 72 hours. Thyroid Function Tests: No results for input(s): TSH, T4TOTAL, FREET4, T3FREE, THYROIDAB in the last 72 hours. Anemia Panel:  Recent Labs  12/26/15 0539  VITAMINB12 315  FERRITIN 6*  TIBC 486*  IRON 21*  RETICCTPCT 2.4   Urine analysis:    Component Value Date/Time   COLORURINE YELLOW 12/25/2015 2305   APPEARANCEUR CLOUDY* 12/25/2015 2305   LABSPEC 1.028 12/25/2015 2305   PHURINE 5.5 12/25/2015 2305   GLUCOSEU NEGATIVE 12/25/2015 2305   HGBUR SMALL* 12/25/2015 2305   BILIRUBINUR NEGATIVE 12/25/2015 2305   KETONESUR NEGATIVE 12/25/2015 2305   PROTEINUR NEGATIVE 12/25/2015 2305    UROBILINOGEN 0.2 04/03/2013 1106   NITRITE NEGATIVE 12/25/2015 2305   LEUKOCYTESUR NEGATIVE 12/25/2015 2305     [name] M.D. [facility] 12/27/2015, 11:31 AM  Pager: [phone] Between 7am to 7pm - call Pager - [phone]  After 7pm go to [email or web address] - password [email or web address]  Call night coverage person covering after 7pm

## 2015-12-28 ENCOUNTER — Encounter (HOSPITAL_COMMUNITY): Payer: Self-pay | Admitting: *Deleted

## 2015-12-28 ENCOUNTER — Encounter (HOSPITAL_COMMUNITY)
Admission: EM | Disposition: A | Discharge status: Home / Self Care | Payer: Self-pay | Source: Home / Self Care | Attending: Emergency Medicine

## 2015-12-28 DIAGNOSIS — D500 Iron deficiency anemia secondary to blood loss (chronic): Secondary | ICD-10-CM | POA: Diagnosis not present

## 2015-12-28 DIAGNOSIS — R5383 Other fatigue: Secondary | ICD-10-CM | POA: Diagnosis not present

## 2015-12-28 DIAGNOSIS — E876 Hypokalemia: Secondary | ICD-10-CM | POA: Diagnosis not present

## 2015-12-28 DIAGNOSIS — D509 Iron deficiency anemia, unspecified: Secondary | ICD-10-CM | POA: Diagnosis not present

## 2015-12-28 DIAGNOSIS — D123 Benign neoplasm of transverse colon: Secondary | ICD-10-CM | POA: Diagnosis not present

## 2015-12-28 DIAGNOSIS — K31811 Angiodysplasia of stomach and duodenum with bleeding: Secondary | ICD-10-CM | POA: Diagnosis not present

## 2015-12-28 DIAGNOSIS — I10 Essential (primary) hypertension: Secondary | ICD-10-CM | POA: Diagnosis not present

## 2015-12-28 DIAGNOSIS — D126 Benign neoplasm of colon, unspecified: Secondary | ICD-10-CM | POA: Diagnosis not present

## 2015-12-28 DIAGNOSIS — E1151 Type 2 diabetes mellitus with diabetic peripheral angiopathy without gangrene: Secondary | ICD-10-CM | POA: Diagnosis not present

## 2015-12-28 DIAGNOSIS — D649 Anemia, unspecified: Secondary | ICD-10-CM | POA: Diagnosis not present

## 2015-12-28 DIAGNOSIS — D128 Benign neoplasm of rectum: Secondary | ICD-10-CM | POA: Diagnosis not present

## 2015-12-28 DIAGNOSIS — Z86010 Personal history of colonic polyps: Secondary | ICD-10-CM | POA: Diagnosis not present

## 2015-12-28 HISTORY — PX: COLONOSCOPY: SHX5424

## 2015-12-28 HISTORY — PX: ENTEROSCOPY: SHX5533

## 2015-12-28 HISTORY — PX: HOT HEMOSTASIS: SHX5433

## 2015-12-28 LAB — GLUCOSE, CAPILLARY
GLUCOSE-CAPILLARY: 90 mg/dL (ref 65–99)
GLUCOSE-CAPILLARY: 76 mg/dL (ref 65–99)
GLUCOSE-CAPILLARY: 143 mg/dL — AB (ref 65–99)
Glucose-Capillary: 103 mg/dL — ABNORMAL HIGH (ref 65–99)
Glucose-Capillary: 79 mg/dL (ref 65–99)

## 2015-12-28 LAB — CBC
HEMATOCRIT: 32.2 % — AB (ref 36.0–46.0)
HEMOGLOBIN: 9.6 g/dL — AB (ref 12.0–15.0)
MCH: 22.9 pg — ABNORMAL LOW (ref 26.0–34.0)
MCHC: 29.8 g/dL — AB (ref 30.0–36.0)
MCV: 76.7 fL — AB (ref 78.0–100.0)
Platelets: 443 10*3/uL — ABNORMAL HIGH (ref 150–400)
RBC: 4.2 MIL/uL (ref 3.87–5.11)
RDW: 15.3 % (ref 11.5–15.5)
WBC: 8.1 10*3/uL (ref 4.0–10.5)

## 2015-12-28 SURGERY — ENTEROSCOPY
Anesthesia: Moderate Sedation

## 2015-12-28 MED ORDER — SODIUM CHLORIDE 0.9 % IV SOLN
INTRAVENOUS | Status: DC
  Administered 2015-12-28: 500 mL via INTRAVENOUS

## 2015-12-28 MED ORDER — FENTANYL CITRATE (PF) 100 MCG/2ML IJ SOLN
INTRAMUSCULAR | Status: DC | PRN
  Administered 2015-12-28 (×4): 25 ug via INTRAVENOUS

## 2015-12-28 MED ORDER — FENTANYL CITRATE (PF) 100 MCG/2ML IJ SOLN
INTRAMUSCULAR | Status: AC
  Filled 2015-12-28: qty 4

## 2015-12-28 MED ORDER — MIDAZOLAM HCL 5 MG/5ML IJ SOLN
INTRAMUSCULAR | Status: DC | PRN
  Administered 2015-12-28 (×2): 2 mg via INTRAVENOUS
  Administered 2015-12-28: 1 mg via INTRAVENOUS
  Administered 2015-12-28 (×2): 2 mg via INTRAVENOUS
  Administered 2015-12-28: 1 mg via INTRAVENOUS

## 2015-12-28 MED ORDER — DIPHENHYDRAMINE HCL 50 MG/ML IJ SOLN
INTRAMUSCULAR | Status: AC
  Filled 2015-12-28: qty 1

## 2015-12-28 MED ORDER — DIPHENHYDRAMINE HCL 50 MG/ML IJ SOLN
INTRAMUSCULAR | Status: DC | PRN
Start: 2015-12-28 — End: 2015-12-28
  Administered 2015-12-28: 25 mg via INTRAVENOUS

## 2015-12-28 MED ORDER — BUTAMBEN-TETRACAINE-BENZOCAINE 2-2-14 % EX AERO
INHALATION_SPRAY | CUTANEOUS | Status: DC | PRN
Start: 2015-12-28 — End: 2015-12-28
  Administered 2015-12-28: 2 via TOPICAL

## 2015-12-28 MED ORDER — PANTOPRAZOLE SODIUM 40 MG PO TBEC: 40.0000 mg | DELAYED_RELEASE_TABLET | Freq: Every day | ORAL | Status: DC

## 2015-12-28 MED ORDER — MIDAZOLAM HCL 5 MG/ML IJ SOLN
INTRAMUSCULAR | Status: AC
  Filled 2015-12-28: qty 2

## 2015-12-28 NOTE — Op Note (Signed)
[facility] Patient Name: [name] Procedure Date : 12/28/2015 MRN: [id number] Attending MD: Carol Ada , MD Date of Birth: [birth year] CSN: [id number] Age: 65 Admit Type: Inpatient Procedure:                Colonoscopy Indications:              High risk colon cancer surveillance: Personal                            history of colonic polyps Providers:                Carol Ada, MD, Kingsley Plan, RN, Ralene Bathe,                            Technician Referring MD:              Medicines:                None Complications:            No immediate complications. Estimated Blood Loss:     Estimated blood loss was minimal. Procedure:                Pre-Anesthesia Assessment:                           - Prior to the procedure, a History and Physical                            was performed, and patient medications and                            allergies were reviewed. The patient's tolerance of                            previous anesthesia was also reviewed. The risks                            and benefits of the procedure and the sedation                            options and risks were discussed with the patient.                            All questions were answered, and informed consent                            was obtained. Prior Anticoagulants: The patient has                            taken no previous anticoagulant or antiplatelet                            agents. ASA Grade Assessment: III - A patient with                            severe systemic  disease. After reviewing the risks                            and benefits, the patient was deemed in                            satisfactory condition to undergo the procedure.                           - Sedation was administered by an endoscopy nurse.                            The sedation level attained was moderate.                           - Prior to the procedure, a History and Physical                             was performed, and patient medications and                            allergies were reviewed. The patient's tolerance of                            previous anesthesia was also reviewed. The risks                            and benefits of the procedure and the sedation                            options and risks were discussed with the patient.                            All questions were answered, and informed consent                            was obtained. Prior Anticoagulants: The patient has                            taken no previous anticoagulant or antiplatelet                            agents. ASA Grade Assessment: III - A patient with                            severe systemic disease. After reviewing the risks                            and benefits, the patient was deemed in                            satisfactory condition to undergo the procedure.                           -  Sedation was administered by an endoscopy nurse.                            The sedation level attained was moderate.                           After obtaining informed consent, the colonoscope                            was passed under direct vision. Throughout the                            procedure, the patient's blood pressure, pulse, and                            oxygen saturations were monitored continuously. The                            EC-[id number] ([id number]) scope was introduced through                            the anus and advanced to the the cecum, identified                            by appendiceal orifice and ileocecal valve. The                            colonoscopy was performed without difficulty. The                            patient tolerated the procedure well. The quality                            of the bowel preparation was good. The ileocecal                            valve, appendiceal orifice, and rectum were                             photographed. Scope In: 3:39:01 PM Scope Out: 4:03:55 PM Scope Withdrawal Time: 0 hours 19 minutes 43 seconds  Total Procedure Duration: 0 hours 24 minutes 54 seconds  Findings:      Five sessile polyps were found in the rectum and transverse colon. The       polyps were 3 to 4 mm in size. These polyps were removed with a cold       snare. Resection and retrieval were complete.      Two sessile and semi-pedunculated polyps were found in the rectum. The       polyps were 6 to 10 mm in size. These polyps were removed with a hot       snare. Resection and retrieval were complete.      A few small-mouthed diverticula were found in the sigmoid colon. Impression:               - Five  3 to 4 mm polyps in the rectum and in the                            transverse colon, removed with a cold snare.                            Resected and retrieved.                           - Two 6 to 10 mm polyps in the rectum, removed with                            a hot snare. Resected and retrieved.                           - Diverticulosis in the sigmoid colon. Moderate Sedation:      Moderate (conscious) sedation was administered by the endoscopy nurse       and supervised by the endoscopist. The following parameters were       monitored: oxygen saturation, heart rate, blood pressure, and response       to care. Recommendation:           - Patient has a contact number available for                            emergencies. The signs and symptoms of potential                            delayed complications were discussed with the                            patient. Return to normal activities tomorrow.                            Written discharge instructions were provided to the                            patient.                           - Resume previous diet.                           - Continue present medications.                           - Await pathology results.                           -  Repeat colonoscopy in 1-3 year for surveillance. Procedure Code(s):        --- Professional ---                           [phone], Colonoscopy, flexible; with removal of  tumor(s), polyp(s), or other lesion(s) by snare                            technique Diagnosis Code(s):        --- Professional ---                           D12.3, Benign neoplasm of transverse colon (hepatic                            flexure or splenic flexure)                           Z86.010, Personal history of colonic polyps                           K62.1, Rectal polyp                           K57.30, Diverticulosis of large intestine without                            perforation or abscess without bleeding CPT copyright 2016 [employer]. All rights reserved. The codes documented in this report are preliminary and upon coder review may  be revised to meet current compliance requirements. Carol Ada, MD Carol Ada, MD 12/28/2015 4:18:11 PM This report has been signed electronically. Number of Addenda: 0

## 2015-12-28 NOTE — Care Management Note (Signed)
Case Management Note  Patient Details  Name: [name] MRN: [id number] Date of Birth: 1951-01-09  Subjective/Objective:                 Patient from home, in obs for anemia, hx AVMs, colonoscopy today with possible DC to home after.   Action/Plan:  No CM needs identified, will DC to home self care Expected Discharge Date:                  Expected Discharge Plan:  Home/Self Care  In-House Referral:     Discharge planning Services  CM Consult  Post Acute Care Choice:  NA Choice offered to:     DME Arranged:    DME Agency:     HH Arranged:    HH Agency:     Status of Service:  Completed, signed off  If discussed at [employer] of Stay Meetings, dates discussed:    Additional Comments:  [name], RN 12/28/2015, 1:15 PM

## 2015-12-28 NOTE — Interval H&P Note (Signed)
History and Physical Interval Note:  12/28/2015 3:02 PM  [name]  has presented today for surgery, with the diagnosis of Anemia, history small bowel AVMs, and history of adenomas  The various methods of treatment have been discussed with the patient and family. After consideration of risks, benefits and other options for treatment, the patient has consented to  Procedure(s): ENTEROSCOPY (N/A) COLONOSCOPY (N/A) as a surgical intervention .  The patient's history has been reviewed, patient examined, no change in status, stable for surgery.  I have reviewed the patient's chart and labs.  Questions were answered to the patient's satisfaction.     Myles Mallicoat D

## 2015-12-28 NOTE — Op Note (Signed)
[facility] Patient Name: [name] Procedure Date : 12/28/2015 MRN: [id number] Attending MD: Carol Ada , MD Date of Birth: June 04, 1951 CSN: [id number] Age: 65 Admit Type: Inpatient Procedure:                Small bowel enteroscopy Indications:              Arteriovenous malformation in the small intestine Providers:                Carol Ada, MD, Kingsley Plan, RN, Ralene Bathe,                            Technician Referring MD:              Medicines:                Fentanyl 100 micrograms IV, Midazolam 10 mg IV,                            Diphenhydramine 25 mg IV Complications:            No immediate complications. Estimated Blood Loss:     Estimated blood loss was minimal. Procedure:                Pre-Anesthesia Assessment:                           - Prior to the procedure, a History and Physical                            was performed, and patient medications and                            allergies were reviewed. The patient's tolerance of                            previous anesthesia was also reviewed. The risks                            and benefits of the procedure and the sedation                            options and risks were discussed with the patient.                            All questions were answered, and informed consent                            was obtained. Prior Anticoagulants: The patient has                            taken no previous anticoagulant or antiplatelet                            agents. ASA Grade Assessment: III - A patient with  severe systemic disease. After reviewing the risks                            and benefits, the patient was deemed in                            satisfactory condition to undergo the procedure.                           - Sedation was administered by an endoscopy nurse.                            The sedation level attained was moderate.  After obtaining informed consent, the endoscope was                            passed under direct vision. Throughout the                            procedure, the patient's blood pressure, pulse, and                            oxygen saturations were monitored continuously. The                            EC-[id number] ([id number]) scope was introduced through                            the mouth and advanced to the proximal jejunum. The                            small bowel enteroscopy was technically difficult                            and complex. The patient tolerated the procedure                            well. Scope In: Scope Out: Findings:      A single 4 mm mucosal nodule with a localized distribution was found at       the gastroesophageal junction. Biopsies were taken with a cold forceps       for histology.      The stomach was normal.      Multiple angiodysplastic lesions with no bleeding were found in the       third portion of the duodenum and in the fourth portion of the duodenum.       Coagulation for bleeding prevention using monopolar probe was       successful. Estimated blood loss: none.      Multiple angiodysplastic lesions with no bleeding were found in the       proximal jejunum. Coagulation for bleeding prevention using monopolar       probe was successful. For hemostasis, one hemostatic clip was       successfully placed (MR unsafe). There was no bleeding at the end of the       procedure. Estimated blood  loss was minimal. Impression:               - Mucosal nodule found in the esophagus. Biopsied.                           - Normal stomach.                           - Multiple non-bleeding angiodysplastic lesions in                            the duodenum. Treated with a monopolar probe.                           - Multiple non-bleeding angiodysplastic lesions in                            the jejunum. Treated with a monopolar probe. Clip                             (MR unsafe) was placed. Moderate Sedation:      Moderate (conscious) sedation was administered by the endoscopy nurse       and supervised by the endoscopist. The following parameters were       monitored: oxygen saturation, heart rate, blood pressure, and response       to care. Recommendation:           - Return patient to hospital ward for ongoing care.                           - If HGB stable in the AM she can be discharged                            home.                           - Follow up in the office in 4 weeks. Procedure Code(s):        --- Professional ---                           [phone], 63, Small intestinal endoscopy, enteroscopy                            beyond second portion of duodenum, not including                            ileum; with control of bleeding (eg, injection,                            bipolar cautery, unipolar cautery, laser, heater                            probe, stapler, plasma coagulator) Diagnosis Code(s):        --- Professional ---  K55.20, Angiodysplasia of colon without hemorrhage                           K31.819, Angiodysplasia of stomach and duodenum                            without bleeding                           Q27.33, Arteriovenous malformation of digestive                            system vessel                           K22.8, Other specified diseases of esophagus CPT copyright 2016 [employer]. All rights reserved. The codes documented in this report are preliminary and upon coder review may  be revised to meet current compliance requirements. Carol Ada, MD Carol Ada, MD 12/28/2015 4:14:11 PM This report has been signed electronically. Number of Addenda: 0

## 2015-12-28 NOTE — Progress Notes (Signed)
[facility]                                                                              Patient Demographics  [name], is a 65 y.o. female, DOB - [birth year], PZW:[id number]  Admit date - 12/26/2015   Admitting Physician Edwin Dada, MD  Outpatient Primary MD for the patient is OSEI-BONSU,GEORGE, MD  Outpatient specialists:   LOS -   days    Chief Complaint  Patient presents with  . Chest Pain  . Dizziness       Brief summary  [name] is a 65 y.o. female with a past medical history significant for HTN, NIDDM, and prior GI bleed in the setting of Plavix from AVMs who presented with shortness of breath for several days. The patient was in her usual state of health until about a week ago when she started to develop dizziness and "not feeling right". In the last few days this has progressed to become more constant and she has had shortness of breath with exertion, so today she came to the ER.  In ED, patient was noted to have hemoglobin of 8.3, microcytic, previously 13, FOBT positive. Patient was admitted for further workup. She had an episode of melena in 2014 while taking Plavix, scope at that time by Dr. Benson Norway showed AVMs in duodenum which were oozing (also polyps and diverticular disease). She is no longer taking Plavix or other blood thinners other than baby aspirin. She denied NSAID use or alcohol. She denied recent melena, hematochezia, epistaxis, vaginal bleeding, or hemetemesis.     Assessment & Plan    Principal Problem:   Symptomatic anemia - Microcytic, patient denies any overt bleeding, possibly chronic GI bleed from the known AVMs. - Hold aspirin -  Transfused 1 unit on 7/4, hemoglobin today 9.6. - Anemia panel with severe iron deficiency anemia, ferritin 6, iron 21, % sat 4 - Colonoscopy today showed diverticulosis. Enteroscopy showed multiple nonbleeding AVM's in jejunum.  - per GI, serial h/h, if CBC in am stable, dc in am    Active Problems:   HTN (hypertension) - Currently stable, continue amlodipine    Hypokalemia - Currently stable    Type 2 diabetes mellitus with peripheral vascular disease ([place]) - Continue sliding scale insulin  Hyperlipidemia Continue Lipitor  Code Status: Full CODE STATUS  DVT Prophylaxis:   SCD's  Family Communication: Discussed in detail with the patient, all imaging results, lab results explained to the patient    Disposition Plan:   Time Spent in minutes   25 minutes  Procedures:  Enterscopy colonoscopy  Consultants:   GI  Antimicrobials:   None   Medications  Scheduled Meds: . [MAR Hold] amLODipine  10 mg Oral Daily  . [MAR Hold] atorvastatin  40 mg Oral Daily  . [MAR Hold] insulin aspart  0-9 Units Subcutaneous TID WC   Continuous Infusions: . sodium chloride 500 mL (12/28/15 1423)  . 0.9 % NaCl with KCl 20 mEq / L 1,000 mL (12/28/15 1225)   PRN Meds:.[MAR Hold] acetaminophen **OR** [MAR Hold] acetaminophen   Antibiotics   Anti-infectives  None        Subjective:   Sarya Linenberger was seen and examined today am prior to endoscopy. Patient denies dizziness, chest pain, abdominal pain, N/V/D/C, new weakness, numbess, tingling. No acute events overnight.  Fatigue and shortness of breath better.   Objective:   Filed Vitals:   12/27/15 2132 12/28/15 0646 12/28/15 1333 12/28/15 1415  BP: 153/73 128/65 154/65 198/90  Pulse: 61 58 66   Temp: 98 F (36.7 C) 98.2 F (36.8 C) 98.5 F (36.9 C) 98 F (36.7 C)  TempSrc: Oral   Oral  Resp: '17 16 20 13  '$ Height:      Weight:      SpO2: 92% 97% 99% 97%    Intake/Output Summary (Last 24 hours) at 12/28/15 1618 Last data filed at 12/28/15 0912  Gross per 24 hour  Intake 2600.42 ml  Output      0 ml  Net 2600.42 ml     Wt Readings from Last 3 Encounters:  12/26/15 93.4 kg (205 lb 14.6 oz)  09/18/14 95.255 kg (210 lb)  03/08/14 95.255 kg (210 lb)     Exam  General: Alert and  oriented x 3, NAD  HEENT:   Neck:   Cardiovascular: S1 S2 auscultated, no rubs, murmurs or gallops. Regular rate and rhythm.  Respiratory: Clear to auscultation bilaterally, no wheezing, rales or rhonchi  Gastrointestinal: Soft, nontender, nondistended, + bowel sounds  Ext: no cyanosis clubbing or edema  Neuro: no new deficits  Skin: No rashes  Psych: Normal affect and demeanor, alert and oriented x3    Data Reviewed:  I have personally reviewed following labs and imaging studies  Micro Results No results found for this or any previous visit (from the past 240 hour(s)).  Radiology Reports Dg Chest 2 View  12/25/2015  CLINICAL DATA:  Central chest pain onset tonight. EXAM: CHEST  2 VIEW COMPARISON:  Chest CT 09/07/2014 radiographs 11/29/2013 FINDINGS: Mild cardiomegaly is unchanged. Mediastinal contours are unchanged, there is atherosclerosis of the thoracic aorta. Linear scarring in both lung bases, unchanged from prior CT. No pulmonary edema, focal consolidation, pleural effusion or pneumothorax. Degenerative change in the spine. IMPRESSION: 1. Stable mild cardiomegaly. Stable linear scarring in the lung bases. 2. Aortic atherosclerosis. Electronically Signed   By: Jeb Levering M.D.   On: 12/25/2015 23:21   Mm Digital Screening Bilateral  12/14/2015  CLINICAL DATA:  Screening. EXAM: DIGITAL SCREENING BILATERAL MAMMOGRAM WITH CAD COMPARISON:  Previous exam(s). ACR Breast Density Category b: There are scattered areas of fibroglandular density. FINDINGS: There are no findings suspicious for malignancy. Images were processed with CAD. IMPRESSION: No mammographic evidence of malignancy. A result letter of this screening mammogram will be mailed directly to the patient. RECOMMENDATION: Screening mammogram in one year. (Code:SM-B-01Y) BI-RADS CATEGORY  1: Negative. Electronically Signed   By: Lajean Manes M.D.   On: 12/14/2015 09:13    Lab Data:  CBC:  Recent Labs Lab  12/25/15 2254 12/26/15 0539 12/26/15 0849 12/26/15 1546 12/27/15 0704 12/28/15 0703  WBC 11.0*  --  8.6  --  10.7* 8.1  HGB 8.3*  --  7.6* 9.0* 9.6* 9.6*  HCT 29.0* 26.8* 26.7* 30.2* 32.2* 32.2*  MCV 76.9*  --  78.1  --  75.9* 76.7*  PLT 540*  --  470*  --  488* 433*   Basic Metabolic Panel:  Recent Labs Lab 12/25/15 2254 12/26/15 0849 12/27/15 0704  NA 142 139 137  K 3.4* 3.7 4.0  CL 109 111 108  CO2 '25 23 23  '$ GLUCOSE 140* 136* 98  BUN 12 11 <5*  CREATININE 0.73 0.64 0.61  CALCIUM 9.7 8.8* 9.0  MG  --  1.7 2.0   GFR: Estimated Creatinine Clearance: 70.1 mL/min (by C-G formula based on Cr of 0.61). Liver Function Tests:  Recent Labs Lab 12/26/15 0849 12/27/15 0704  AST 15 14*  ALT 10* 9*  ALKPHOS 85 98  BILITOT 0.2* 0.7  PROT 6.5 7.2  ALBUMIN 3.1* 3.4*   No results for input(s): LIPASE, AMYLASE in the last 168 hours. No results for input(s): AMMONIA in the last 168 hours. Coagulation Profile: No results for input(s): INR, PROTIME in the last 168 hours. Cardiac Enzymes: No results for input(s): CKTOTAL, CKMB, CKMBINDEX, TROPONINI in the last 168 hours. BNP (last 3 results) No results for input(s): PROBNP in the last 8760 hours. HbA1C:  Recent Labs  12/26/15 0849  HGBA1C 5.6   CBG:  Recent Labs Lab 12/27/15 1741 12/27/15 2046 12/28/15 0751 12/28/15 1131 12/28/15 1421  GLUCAP 99 84 103* 90 76   Lipid Profile: No results for input(s): CHOL, HDL, LDLCALC, TRIG, CHOLHDL, LDLDIRECT in the last 72 hours. Thyroid Function Tests: No results for input(s): TSH, T4TOTAL, FREET4, T3FREE, THYROIDAB in the last 72 hours. Anemia Panel:  Recent Labs  12/26/15 0539  VITAMINB12 315  FERRITIN 6*  TIBC 486*  IRON 21*  RETICCTPCT 2.4   Urine analysis:    Component Value Date/Time   COLORURINE YELLOW 12/25/2015 2305   APPEARANCEUR CLOUDY* 12/25/2015 2305   LABSPEC 1.028 12/25/2015 2305   PHURINE 5.5 12/25/2015 2305   GLUCOSEU NEGATIVE 12/25/2015  2305   HGBUR SMALL* 12/25/2015 2305   BILIRUBINUR NEGATIVE 12/25/2015 2305   KETONESUR NEGATIVE 12/25/2015 2305   PROTEINUR NEGATIVE 12/25/2015 2305   UROBILINOGEN 0.2 04/03/2013 1106   NITRITE NEGATIVE 12/25/2015 2305   LEUKOCYTESUR NEGATIVE 12/25/2015 2305     Garnet Chatmon M.D. [facility] 12/28/2015, 4:18 PM  Pager: [phone] Between 7am to 7pm - call Pager - [phone]  After 7pm go to [email or web address] - password [email or web address]  Call night coverage person covering after 7pm

## 2015-12-28 NOTE — Discharge Summary (Signed)
Physician Discharge Summary   Patient ID: [name] MRN: [id number] DOB/AGE: April 06, 1951 65 y.o.  Admit date: 12/26/2015 Discharge date: 12/29/2015  Primary Care Physician:  Benito Mccreedy, MD  Discharge Diagnoses:    . Symptomatic anemia   Iron deficiency anemia  . HTN (hypertension) . Hypokalemia . Type 2 diabetes mellitus with peripheral vascular disease (HCC)  Consults:  [facility]ast[facility]urology, Dr. Benson Norway  Recommendations for Outpatient Follow-up:  1. Please repeat CBC/BMET at next visit   DIET: Carb modified diet    Allergies:  No Known Allergies   DISCHARGE MEDICATIONS: Current Discharge Medication List    START taking these medications   Details  pantoprazole (PROTONIX) 40 MG tablet Take 1 tablet (40 mg total) by mouth daily. Qty: 30 tablet, Refills: 1      CONTINUE these medications which have NOT CHANGED   Details  amLODipine (NORVASC) 10 MG tablet Take 1 tablet (10 mg total) by mouth daily. Qty: 30 tablet, Refills: 1    atorvastatin (LIPITOR) 40 MG tablet Take 40 mg by mouth daily.    metFORMIN (GLUCOPHAGE) 500 MG tablet Take 0.5 tablets (250 mg total) by mouth 2 (two) times daily with a meal.    nitroGLYCERIN (NITROSTAT) 0.4 MG SL tablet Place 0.4 mg under the tongue every 5 (five) minutes as needed for chest pain.      STOP taking these medications     aspirin EC 81 MG tablet          Brief H and P: For complete details please refer to admission H and P, but in briefJoette C [name] is a 65 y.o. female with a past medical history significant for HTN, NIDDM, and prior GI bleed in the setting of Plavix from AVMs who presented with shortness of breath for several days. The patient was in her usual state of health until about a week ago when she started to develop dizziness and "not feeling right". In the last few days this has progressed to become more constant and she has had shortness of breath with exertion, so today she came to the ER.  In ED,  patient was noted to have hemoglobin of 8.3, microcytic, previously 13, FOBT positive. Patient was admitted for further workup. She had an episode of melena in 2014 while taking Plavix, scope at that time by Dr. Benson Norway showed AVMs in duodenum which were oozing (also polyps and diverticular disease). She is no longer taking Plavix or other blood thinners other than baby aspirin. She denied NSAID use or alcohol. She denied recent melena, hematochezia, epistaxis, vaginal bleeding, or hemetemesis.   Hospital Course:  Symptomatic anemia - Microcytic, patient denied any overt bleeding, possibly chronic GI bleed from the known AVMs. -  Hold aspirin - Transfused 1 unit on 7/4 for the hemoglobin of 7.6. Serial H&H were done, hemoglobin has remained stable - Anemia panel with severe iron deficiency anemia, ferritin 6, iron 21, % sat 4 - GI was consulted and recommended enteroscopy with colonoscopy.colonoscopy showed diverticulosis. Enteroscopy showed multiple nonbleeding AVM's in jejunum. Patient had a small bleeding episode the next day, spoke with Dr. Benson Norway, recommended conservative management and cleared to be discharged.   HTN (hypertension) - Currently stable, continue amlodipine   Hypokalemia - Currently stable   Type 2 diabetes mellitus with peripheral vascular disease ([facility]) - Continue sliding scale insulin  Hyperlipidemia Continue Lipitor   Day of Discharge BP 153/58 mmHg  Pulse 58  Temp(Src) 98.3 F (36.8 C) (Oral)  Resp 18  Ht 4'  11" (1.499 m)  Wt 93.4 kg (205 lb 14.6 oz)  BMI 41.57 kg/m2  SpO2 95%  Physical Exam: General: Alert and awake oriented x3 not in any acute distress. HEENT: anicteric sclera, pupils reactive to light and accommodation CVS: S1-S2 clear no murmur rubs or gallops Chest: clear to auscultation bilaterally, no wheezing rales or rhonchi Abdomen: soft nontender, nondistended, normal bowel sounds Extremities: no cyanosis, clubbing or edema noted  bilaterally Neuro: Cranial nerves II-XII intact, no focal neurological deficits   The results of significant diagnostics from this hospitalization (including imaging, microbiology, ancillary and laboratory) are listed below for reference.    LAB RESULTS: Basic Metabolic Panel:  Recent Labs Lab 12/27/15 0704 12/29/15 0504  NA 137 138  K 4.0 4.1  CL 108 109  CO2 23 24  GLUCOSE 98 99  BUN <5* 11  CREATININE 0.61 0.75  CALCIUM 9.0 8.8*  MG 2.0  --    Liver Function Tests:  Recent Labs Lab 12/26/15 0849 12/27/15 0704  AST 15 14*  ALT 10* 9*  ALKPHOS 85 98  BILITOT 0.2* 0.7  PROT 6.5 7.2  ALBUMIN 3.1* 3.4*   No results for input(s): LIPASE, AMYLASE in the last 168 hours. No results for input(s): AMMONIA in the last 168 hours. CBC:  Recent Labs Lab 12/28/15 0703 12/29/15 0504  WBC 8.1 11.2*  HGB 9.6* 9.6*  HCT 32.2* 32.2*  MCV 76.7* 76.1*  PLT 443* 463*   Cardiac Enzymes: No results for input(s): CKTOTAL, CKMB, CKMBINDEX, TROPONINI in the last 168 hours. BNP: Invalid input(s): POCBNP CBG:  Recent Labs Lab 12/29/15 0805 12/29/15 1140  GLUCAP 115* 103*    Significant Diagnostic Studies:  No results found.  2D ECHO:   Disposition and Follow-up: Discharge Instructions    Diet - low sodium heart healthy    Complete by:  As directed      Increase activity slowly    Complete by:  As directed             DISPOSITION: Home   DISCHARGE FOLLOW-UP Follow-up Information    Follow up with OSEI-BONSU,GEORGE, MD. Schedule an appointment as soon as possible for a visit in 2 weeks.   Specialty:  Internal Medicine   Why:  for hospital follow-up   Contact information:   [address] [place] [address] [phone]       Follow up with HUNG,PATRICK D, MD. Schedule an appointment as soon as possible for a visit in 2 weeks.   Specialty:  Gastroenterology   Why:  for hospital follow-up   Contact information:   [place], [place] [address] [phone]        Time spent on Discharge: 25 minutes  Signed:   Brice Potteiger M.D. [facility] 12/29/2015, 12:05 PM Pager: [phone]

## 2015-12-28 NOTE — H&P (View-Only) (Signed)
Reason for Consult: Symptomatic anemia Referring Physician: [facility]  Royston [name] HPI: This is a 65 year old female with a PMH of bleeding proximal small bowel AVMs, colonic polyps (2014), HTN, and DM admitted with complaints of dizziness.  The symptoms continued to progress and she was also short of breath.  In the ER her HGB was noted to be in the 7-8 range and she is feeling better with the blood transfusion.  Her Plavix was stopped approximately 1 year ago.  The EGD in 2014 was significant for bleeding AVMs.  Her colon also had multiple adenomas and she was recommended a 3 year follow up.  Past Medical History  Diagnosis Date  . Hypertension   . Hyperlipemia   . Chronic pain   . Anxiety   . Depression   . GERD (gastroesophageal reflux disease)   . Diabetes mellitus without complication ([place])   . COPD (chronic obstructive pulmonary disease) ([place])   . Heart murmur   . Arthritis   . Deep vein blood clot of right lower extremity ([place]) 12/29/2012    Dr. Nadyne [name], stent in place  . Sleep apnea     uses a cpap  . Wears glasses   . Anemia 12/26/2015    Past Surgical History  Procedure Laterality Date  . Cesarean section    . Uterine fibroid surgery    . Hand surgery    . Abdominal hysterectomy    . Stent in right leg  12/29/2012    for blood clot, Dr. Nadyne [name]  . Esophagogastroduodenoscopy N/A 01/08/2013    Procedure: ESOPHAGOGASTRODUODENOSCOPY (EGD);  Surgeon: Carla Beams, MD;  Location: [facility];  Service: Endoscopy;  Laterality: N/A;  . Colonoscopy N/A 01/08/2013    Procedure: COLONOSCOPY;  Surgeon: Carla Beams, MD;  Location: [facility];  Service: Endoscopy;  Laterality: N/A;  . Cardiac catheterization  4/15  . Tympanomastoidectomy Left 03/08/2014    Procedure: TYMPANOMASTOIDECTOMY LEFT;  Surgeon: Carla Dike, MD;  Location: [place];  Service: ENT;  Laterality: Left;  . Lower extremity angiogram N/A 12/29/2012    Procedure: LOWER EXTREMITY  ANGIOGRAM;  Surgeon: Carla Page, MD;  Location: [facility] CATH LAB;  Service: Cardiovascular;  Laterality: N/A;  . Left heart catheterization with coronary angiogram N/A 03/09/2013    Procedure: LEFT HEART CATHETERIZATION WITH CORONARY ANGIOGRAM;  Surgeon: Carla Page, MD;  Location: [facility] CATH LAB;  Service: Cardiovascular;  Laterality: N/A;  . Lower extremity angiogram N/A 10/05/2013    Procedure: LOWER EXTREMITY ANGIOGRAM;  Surgeon: Carla Page, MD;  Location: [facility] CATH LAB;  Service: Cardiovascular;  Laterality: N/A;  . Tympanomastoidectomy  2015    Family History  Problem Relation Age of Onset  . Diabetes Mother   . Hypertension Mother   . Heart disease Mother   . Heart disease Father   . Hypertension Father     Social History:  reports that she has been smoking Cigarettes.  She has a 10.25 pack-year smoking history. She has never used smokeless tobacco. She reports that she uses illicit drugs (Marijuana). She reports that she does not drink alcohol.  Allergies: No Known Allergies  Medications:  Scheduled: . amLODipine  10 mg Oral Daily  . atorvastatin  40 mg Oral Daily  . insulin aspart  0-9 Units Subcutaneous TID WC   Continuous: . 0.9 % NaCl with KCl 20 mEq / L 100 mL/hr at 12/27/15 1207    Results for orders placed or performed during  the hospital encounter of 12/26/15 (from the past 24 hour(s))  Glucose, capillary     Status: Abnormal   Collection Time: 12/26/15  9:41 PM  Result Value Ref Range   Glucose-Capillary 110 (H) 65 - 99 mg/dL  CBC     Status: Abnormal   Collection Time: 12/27/15  7:04 AM  Result Value Ref Range   WBC 10.7 (H) 4.0 - 10.5 K/uL   RBC 4.24 3.87 - 5.11 MIL/uL   Hemoglobin 9.6 (L) 12.0 - 15.0 g/dL   HCT 32.2 (L) 36.0 - 46.0 %   MCV 75.9 (L) 78.0 - 100.0 fL   MCH 22.6 (L) 26.0 - 34.0 pg   MCHC 29.8 (L) 30.0 - 36.0 g/dL   RDW 15.4 11.5 - 15.5 %   Platelets 488 (H) 150 - 400 K/uL  Comprehensive metabolic panel     Status: Abnormal    Collection Time: 12/27/15  7:04 AM  Result Value Ref Range   Sodium 137 135 - 145 mmol/L   Potassium 4.0 3.5 - 5.1 mmol/L   Chloride 108 101 - 111 mmol/L   CO2 23 22 - 32 mmol/L   Glucose, Bld 98 65 - 99 mg/dL   BUN <5 (L) 6 - 20 mg/dL   Creatinine, Ser 0.61 0.44 - 1.00 mg/dL   Calcium 9.0 8.9 - 10.3 mg/dL   Total Protein 7.2 6.5 - 8.1 g/dL   Albumin 3.4 (L) 3.5 - 5.0 g/dL   AST 14 (L) 15 - 41 U/L   ALT 9 (L) 14 - 54 U/L   Alkaline Phosphatase 98 38 - 126 U/L   Total Bilirubin 0.7 0.3 - 1.2 mg/dL   GFR calc non Af Amer >60 >60 mL/min   GFR calc Af Amer >60 >60 mL/min   Anion gap 6 5 - 15  Magnesium     Status: None   Collection Time: 12/27/15  7:04 AM  Result Value Ref Range   Magnesium 2.0 1.7 - 2.4 mg/dL  Glucose, capillary     Status: Abnormal   Collection Time: 12/27/15  8:01 AM  Result Value Ref Range   Glucose-Capillary 108 (H) 65 - 99 mg/dL  Glucose, capillary     Status: None   Collection Time: 12/27/15 11:18 AM  Result Value Ref Range   Glucose-Capillary 99 65 - 99 mg/dL  Glucose, capillary     Status: None   Collection Time: 12/27/15  5:41 PM  Result Value Ref Range   Glucose-Capillary 99 65 - 99 mg/dL     Dg Chest 2 View  12/25/2015  CLINICAL DATA:  Central chest pain onset tonight. EXAM: CHEST  2 VIEW COMPARISON:  Chest CT 09/07/2014 radiographs 11/29/2013 FINDINGS: Mild cardiomegaly is unchanged. Mediastinal contours are unchanged, there is atherosclerosis of the thoracic aorta. Linear scarring in both lung bases, unchanged from prior CT. No pulmonary edema, focal consolidation, pleural effusion or pneumothorax. Degenerative change in the spine. IMPRESSION: 1. Stable mild cardiomegaly. Stable linear scarring in the lung bases. 2. Aortic atherosclerosis. Electronically Signed   By: [name] M.D.   On: 12/25/2015 23:21    ROS:  As stated above in the HPI otherwise negative.  Blood pressure 156/84, pulse 61, temperature 98.8 F (37.1 C), temperature source  Oral, resp. rate 20, height '4\' 11"'$  (1.499 m), weight 93.4 kg (205 lb 14.6 oz), SpO2 98 %.    PE: Gen: NAD, Alert and Oriented HEENT:  [place]/AT, EOMI Neck: Supple, no LAD Lungs: CTA Bilaterally CV: RRR  without M/G/R ABM: Soft, NTND, +BS Ext: No C/C/E  Assessment/Plan: 1) History of bleeding AVMs. 2) History of colonic polyps. 3) Anemia.   My suspicion with her history is that she has bleeding AVMs again.  I will pursue an enteroscopy and ablate any identifiable lesions.  Plan: 1) Enteroscopy and colonoscopy tomorrow.  [name] D 12/27/2015, 6:15 PM

## 2015-12-29 DIAGNOSIS — D509 Iron deficiency anemia, unspecified: Secondary | ICD-10-CM | POA: Diagnosis not present

## 2015-12-29 DIAGNOSIS — D649 Anemia, unspecified: Secondary | ICD-10-CM | POA: Diagnosis not present

## 2015-12-29 DIAGNOSIS — E1151 Type 2 diabetes mellitus with diabetic peripheral angiopathy without gangrene: Secondary | ICD-10-CM | POA: Diagnosis not present

## 2015-12-29 DIAGNOSIS — E876 Hypokalemia: Secondary | ICD-10-CM | POA: Diagnosis not present

## 2015-12-29 DIAGNOSIS — I10 Essential (primary) hypertension: Secondary | ICD-10-CM | POA: Diagnosis not present

## 2015-12-29 LAB — CBC
HEMATOCRIT: 32.2 % — AB (ref 36.0–46.0)
Hemoglobin: 9.6 g/dL — ABNORMAL LOW (ref 12.0–15.0)
MCH: 22.7 pg — ABNORMAL LOW (ref 26.0–34.0)
MCHC: 29.8 g/dL — AB (ref 30.0–36.0)
MCV: 76.1 fL — ABNORMAL LOW (ref 78.0–100.0)
PLATELETS: 463 10*3/uL — AB (ref 150–400)
RBC: 4.23 MIL/uL (ref 3.87–5.11)
RDW: 15.6 % — AB (ref 11.5–15.5)
WBC: 11.2 10*3/uL — ABNORMAL HIGH (ref 4.0–10.5)

## 2015-12-29 LAB — BASIC METABOLIC PANEL
Anion gap: 5 (ref 5–15)
BUN: 11 mg/dL (ref 6–20)
CALCIUM: 8.8 mg/dL — AB (ref 8.9–10.3)
CO2: 24 mmol/L (ref 22–32)
CREATININE: 0.75 mg/dL (ref 0.44–1.00)
Chloride: 109 mmol/L (ref 101–111)
GLUCOSE: 99 mg/dL (ref 65–99)
Potassium: 4.1 mmol/L (ref 3.5–5.1)
Sodium: 138 mmol/L (ref 135–145)

## 2015-12-29 LAB — GLUCOSE, CAPILLARY
GLUCOSE-CAPILLARY: 103 mg/dL — AB (ref 65–99)
Glucose-Capillary: 115 mg/dL — ABNORMAL HIGH (ref 65–99)

## 2015-12-29 NOTE — Progress Notes (Signed)
Patient was discharged home by MD order; discharged instructions  review and give to patient with care notes and prescription; IV DIC; skin intact; patient will be escorted to the car by a volunteer via wheelchair.  

## 2016-01-08 ENCOUNTER — Encounter: Payer: Self-pay | Admitting: Neurology

## 2016-01-08 ENCOUNTER — Ambulatory Visit (INDEPENDENT_AMBULATORY_CARE_PROVIDER_SITE_OTHER): Payer: Medicare Other | Admitting: Neurology

## 2016-01-08 VITALS — BP 160/88 | HR 80 | Resp 20 | Ht 59.0 in | Wt 200.0 lb

## 2016-01-08 DIAGNOSIS — G4733 Obstructive sleep apnea (adult) (pediatric): Secondary | ICD-10-CM

## 2016-01-08 DIAGNOSIS — G4719 Other hypersomnia: Secondary | ICD-10-CM | POA: Diagnosis not present

## 2016-01-08 DIAGNOSIS — F329 Major depressive disorder, single episode, unspecified: Secondary | ICD-10-CM

## 2016-01-08 DIAGNOSIS — F4541 Pain disorder exclusively related to psychological factors: Secondary | ICD-10-CM | POA: Diagnosis not present

## 2016-01-08 DIAGNOSIS — R351 Nocturia: Secondary | ICD-10-CM | POA: Diagnosis not present

## 2016-01-08 DIAGNOSIS — R5383 Other fatigue: Secondary | ICD-10-CM

## 2016-01-08 DIAGNOSIS — Z9989 Dependence on other enabling machines and devices: Secondary | ICD-10-CM

## 2016-01-08 DIAGNOSIS — F32A Depression, unspecified: Secondary | ICD-10-CM

## 2016-01-08 NOTE — Patient Instructions (Signed)
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
## 2016-01-10 DIAGNOSIS — K31811 Angiodysplasia of stomach and duodenum with bleeding: Secondary | ICD-10-CM | POA: Diagnosis not present

## 2016-01-10 DIAGNOSIS — D126 Benign neoplasm of colon, unspecified: Secondary | ICD-10-CM | POA: Diagnosis not present

## 2016-01-10 DIAGNOSIS — D500 Iron deficiency anemia secondary to blood loss (chronic): Secondary | ICD-10-CM | POA: Diagnosis not present

## 2016-01-12 DIAGNOSIS — Z01118 Encounter for examination of ears and hearing with other abnormal findings: Secondary | ICD-10-CM | POA: Diagnosis not present

## 2016-01-12 DIAGNOSIS — Z131 Encounter for screening for diabetes mellitus: Secondary | ICD-10-CM | POA: Diagnosis not present

## 2016-01-12 DIAGNOSIS — Z136 Encounter for screening for cardiovascular disorders: Secondary | ICD-10-CM | POA: Diagnosis not present

## 2016-01-12 DIAGNOSIS — Z0000 Encounter for general adult medical examination without abnormal findings: Secondary | ICD-10-CM | POA: Diagnosis not present

## 2016-01-12 DIAGNOSIS — H538 Other visual disturbances: Secondary | ICD-10-CM | POA: Diagnosis not present

## 2016-01-18 ENCOUNTER — Ambulatory Visit (INDEPENDENT_AMBULATORY_CARE_PROVIDER_SITE_OTHER): Payer: Medicare Other | Admitting: Neurology

## 2016-01-18 DIAGNOSIS — F32A Depression, unspecified: Secondary | ICD-10-CM

## 2016-01-18 DIAGNOSIS — R351 Nocturia: Secondary | ICD-10-CM

## 2016-01-18 DIAGNOSIS — G4719 Other hypersomnia: Secondary | ICD-10-CM

## 2016-01-18 DIAGNOSIS — G4733 Obstructive sleep apnea (adult) (pediatric): Secondary | ICD-10-CM

## 2016-01-18 DIAGNOSIS — F329 Major depressive disorder, single episode, unspecified: Secondary | ICD-10-CM

## 2016-01-18 DIAGNOSIS — R5383 Other fatigue: Secondary | ICD-10-CM

## 2016-01-18 DIAGNOSIS — F4541 Pain disorder exclusively related to psychological factors: Secondary | ICD-10-CM

## 2016-01-18 DIAGNOSIS — Z9989 Dependence on other enabling machines and devices: Secondary | ICD-10-CM

## 2016-01-22 DIAGNOSIS — J449 Chronic obstructive pulmonary disease, unspecified: Secondary | ICD-10-CM | POA: Diagnosis not present

## 2016-01-22 DIAGNOSIS — E785 Hyperlipidemia, unspecified: Secondary | ICD-10-CM | POA: Diagnosis not present

## 2016-01-22 DIAGNOSIS — E1140 Type 2 diabetes mellitus with diabetic neuropathy, unspecified: Secondary | ICD-10-CM | POA: Diagnosis not present

## 2016-01-22 DIAGNOSIS — I10 Essential (primary) hypertension: Secondary | ICD-10-CM | POA: Diagnosis not present

## 2016-02-05 ENCOUNTER — Telehealth: Payer: Self-pay

## 2016-02-05 DIAGNOSIS — G4733 Obstructive sleep apnea (adult) (pediatric): Secondary | ICD-10-CM

## 2016-02-05 MED ORDER — ZOLPIDEM TARTRATE 10 MG PO TABS: 10.0000 mg | ORAL_TABLET | Freq: Every evening | ORAL | 0 refills | Status: DC | PRN

## 2016-02-05 NOTE — Telephone Encounter (Signed)
I prescribed Ambien 10 mg, she may get by with a half tablet po, Caveat is to make sure that she gets  8 hours between intake and driving /operating machinery.

## 2016-02-05 NOTE — Telephone Encounter (Signed)
I spoke to pt and advised her that her study reveals severe sleep apnea and immediate treatment is recommended. PAP therapy is indicated. Dr. Brett Fairy recommends proceeding with a cpap titration study to optimize therapy. Alternative therapies will not work for this kind of sleep apnea. I advised her that weight loss and positional therapy may be entertained. Pt is agreeable to coming for a cpap titration. Pt verbalized understanding of results.  Pt is asking for the sleep aid that Dr. Brett Fairy said could be available so that insomnia will not interfere with the titration. She wants the medication to be sent to the [facility] on [address].  Dr. Brett Fairy, please prescribe a sleep aid for this pt's sleep study, if you still are agreeable to this.  Will send a copy of these results to Dr. Vista Lawman.

## 2016-02-06 NOTE — Telephone Encounter (Signed)
I spoke to pt and advised her that Dr. Brett Fairy prescribed Lorrin Mais for the pt's sleep study. I advised her that she might get by with only taking 1/2 tablet, but she needs to ensure that she does not drive or operate hazardous machinery for at least 8 hours after she takes a tablet of ambien. It was sent to the [facility] on [address]. Pt verbalized understanding. Pt had no questions at this time but was encouraged to call back if questions arise.

## 2016-02-06 NOTE — Telephone Encounter (Signed)
RX for [employer] faxed to [facility]. Received a receipt of confirmation.

## 2016-02-08 DIAGNOSIS — G4733 Obstructive sleep apnea (adult) (pediatric): Secondary | ICD-10-CM | POA: Diagnosis not present

## 2016-02-08 DIAGNOSIS — E1151 Type 2 diabetes mellitus with diabetic peripheral angiopathy without gangrene: Secondary | ICD-10-CM | POA: Diagnosis not present

## 2016-02-08 DIAGNOSIS — I739 Peripheral vascular disease, unspecified: Secondary | ICD-10-CM | POA: Diagnosis not present

## 2016-02-08 DIAGNOSIS — I25119 Atherosclerotic heart disease of native coronary artery with unspecified angina pectoris: Secondary | ICD-10-CM | POA: Diagnosis not present

## 2016-02-14 ENCOUNTER — Ambulatory Visit (INDEPENDENT_AMBULATORY_CARE_PROVIDER_SITE_OTHER): Payer: Medicare Other | Admitting: Neurology

## 2016-02-14 DIAGNOSIS — G4733 Obstructive sleep apnea (adult) (pediatric): Secondary | ICD-10-CM

## 2016-02-21 ENCOUNTER — Telehealth: Payer: Self-pay

## 2016-02-21 DIAGNOSIS — G4733 Obstructive sleep apnea (adult) (pediatric): Secondary | ICD-10-CM

## 2016-02-21 NOTE — Telephone Encounter (Signed)
I spoke to pt regarding her sleep study results. I advised her that her cpap titration study showed a significant improvement in her respiratory events and that Dr. Brett Fairy recommends starting a CPAP. Pt is agreeable to starting a CPAP. I advised pt to avoid caffeine containing beverages and chocolate.  I advised pt that I will send the order to [facility] and they would call her in about a week to get the cpap set up. A follow up appt was made for 11/7. Pt verbalized understanding of results. Pt had no questions at this time but was encouraged to call back if questions arise.

## 2016-02-28 DIAGNOSIS — I739 Peripheral vascular disease, unspecified: Secondary | ICD-10-CM | POA: Diagnosis not present

## 2016-03-13 DIAGNOSIS — G4733 Obstructive sleep apnea (adult) (pediatric): Secondary | ICD-10-CM | POA: Diagnosis not present

## 2016-04-11 DIAGNOSIS — E119 Type 2 diabetes mellitus without complications: Secondary | ICD-10-CM | POA: Diagnosis not present

## 2016-04-11 DIAGNOSIS — H25813 Combined forms of age-related cataract, bilateral: Secondary | ICD-10-CM | POA: Diagnosis not present

## 2016-04-12 DIAGNOSIS — G4733 Obstructive sleep apnea (adult) (pediatric): Secondary | ICD-10-CM | POA: Diagnosis not present

## 2016-04-16 DIAGNOSIS — H25813 Combined forms of age-related cataract, bilateral: Secondary | ICD-10-CM | POA: Diagnosis not present

## 2016-04-18 DIAGNOSIS — I739 Peripheral vascular disease, unspecified: Secondary | ICD-10-CM | POA: Diagnosis not present

## 2016-04-18 DIAGNOSIS — Z131 Encounter for screening for diabetes mellitus: Secondary | ICD-10-CM | POA: Diagnosis not present

## 2016-04-18 DIAGNOSIS — Z23 Encounter for immunization: Secondary | ICD-10-CM | POA: Diagnosis not present

## 2016-04-18 DIAGNOSIS — E1165 Type 2 diabetes mellitus with hyperglycemia: Secondary | ICD-10-CM | POA: Diagnosis not present

## 2016-04-18 DIAGNOSIS — Z0000 Encounter for general adult medical examination without abnormal findings: Secondary | ICD-10-CM | POA: Diagnosis not present

## 2016-04-18 DIAGNOSIS — Z0100 Encounter for examination of eyes and vision without abnormal findings: Secondary | ICD-10-CM | POA: Diagnosis not present

## 2016-04-18 DIAGNOSIS — I10 Essential (primary) hypertension: Secondary | ICD-10-CM | POA: Diagnosis not present

## 2016-04-18 DIAGNOSIS — Z1389 Encounter for screening for other disorder: Secondary | ICD-10-CM | POA: Diagnosis not present

## 2016-04-18 DIAGNOSIS — Z01118 Encounter for examination of ears and hearing with other abnormal findings: Secondary | ICD-10-CM | POA: Diagnosis not present

## 2016-04-25 DIAGNOSIS — H25812 Combined forms of age-related cataract, left eye: Secondary | ICD-10-CM | POA: Diagnosis not present

## 2016-04-25 DIAGNOSIS — H2512 Age-related nuclear cataract, left eye: Secondary | ICD-10-CM | POA: Diagnosis not present

## 2016-04-30 ENCOUNTER — Telehealth: Payer: Self-pay

## 2016-04-30 ENCOUNTER — Encounter: Payer: Self-pay | Admitting: Neurology

## 2016-04-30 ENCOUNTER — Ambulatory Visit (INDEPENDENT_AMBULATORY_CARE_PROVIDER_SITE_OTHER): Payer: Medicare Other | Admitting: Neurology

## 2016-04-30 VITALS — BP 152/90 | HR 60 | Resp 20 | Ht 59.0 in | Wt 202.0 lb

## 2016-04-30 DIAGNOSIS — G4730 Sleep apnea, unspecified: Secondary | ICD-10-CM

## 2016-04-30 DIAGNOSIS — J449 Chronic obstructive pulmonary disease, unspecified: Secondary | ICD-10-CM

## 2016-04-30 DIAGNOSIS — G4733 Obstructive sleep apnea (adult) (pediatric): Secondary | ICD-10-CM | POA: Diagnosis not present

## 2016-04-30 DIAGNOSIS — Z720 Tobacco use: Secondary | ICD-10-CM | POA: Diagnosis not present

## 2016-04-30 DIAGNOSIS — G4710 Hypersomnia, unspecified: Secondary | ICD-10-CM
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
## 2016-04-30 NOTE — Patient Instructions (Signed)
  Please remember to try to maintain good sleep hygiene, which means: Keep a regular sleep and wake schedule, try not to exercise or have a meal within 2 hours of your bedtime, try to keep your bedroom conducive for sleep, that is, cool and dark, without light distractors such as an illuminated alarm clock, and refrain from watching TV right before sleep or in the middle of the night and do not keep the TV or radio on during the night. Also, try not to use or play on electronic devices at bedtime, such as your cell phone, tablet PC or laptop. If you like to read at bedtime on an electronic device, try to dim the background light as much as possible. Do not eat in the middle of the night.   For chronic insomnia, you are best followed by a psychiatrist and/or sleep psychologist.     I reviewed the patient's CPAP compliance data  - The percent used days greater than 4 hours was 27%, indicating poor compliance. The residual AHI was 0.9 per hour, . Air leak from the mask was  Minor.  I will review this data with the patient at the next office visit, which is currently routinely scheduled for 30 days , provide feedback and additional troubleshooting if need be.  Allante Beane, MD [facility] ([facility])

## 2016-04-30 NOTE — Telephone Encounter (Signed)
Received this email from [place]; "I wanted to let you know that she came to our office just now for an 8:30 appointment, then realized it was probably supposed to be with Dr. Brett Fairy.  She is on her way to you right now. "

## 2016-05-13 DIAGNOSIS — G4733 Obstructive sleep apnea (adult) (pediatric): Secondary | ICD-10-CM | POA: Diagnosis not present

## 2016-05-23 DIAGNOSIS — H25811 Combined forms of age-related cataract, right eye: Secondary | ICD-10-CM | POA: Diagnosis not present

## 2016-05-23 DIAGNOSIS — H2511 Age-related nuclear cataract, right eye: Secondary | ICD-10-CM | POA: Diagnosis not present

## 2016-05-28 DIAGNOSIS — I10 Essential (primary) hypertension: Secondary | ICD-10-CM | POA: Diagnosis not present

## 2016-05-28 DIAGNOSIS — E1140 Type 2 diabetes mellitus with diabetic neuropathy, unspecified: Secondary | ICD-10-CM | POA: Diagnosis not present

## 2016-05-28 DIAGNOSIS — E1165 Type 2 diabetes mellitus with hyperglycemia: Secondary | ICD-10-CM | POA: Diagnosis not present

## 2016-05-28 DIAGNOSIS — J449 Chronic obstructive pulmonary disease, unspecified: Secondary | ICD-10-CM | POA: Diagnosis not present

## 2016-05-28 DIAGNOSIS — Z5181 Encounter for therapeutic drug level monitoring: Secondary | ICD-10-CM | POA: Diagnosis not present

## 2016-05-28 DIAGNOSIS — I739 Peripheral vascular disease, unspecified: Secondary | ICD-10-CM | POA: Diagnosis not present

## 2016-06-06 ENCOUNTER — Ambulatory Visit: Payer: Medicare Other | Admitting: Adult Health

## 2016-06-06 ENCOUNTER — Telehealth: Payer: Self-pay

## 2016-06-06 NOTE — Telephone Encounter (Signed)
Patient did not show to appt.

## 2016-06-10 ENCOUNTER — Encounter: Payer: Self-pay | Admitting: Adult Health

## 2016-06-12 DIAGNOSIS — G4733 Obstructive sleep apnea (adult) (pediatric): Secondary | ICD-10-CM | POA: Diagnosis not present

## 2016-07-13 DIAGNOSIS — G4733 Obstructive sleep apnea (adult) (pediatric): Secondary | ICD-10-CM | POA: Diagnosis not present

## 2016-08-13 DIAGNOSIS — Z5181 Encounter for therapeutic drug level monitoring: Secondary | ICD-10-CM | POA: Diagnosis not present

## 2016-08-13 DIAGNOSIS — E1165 Type 2 diabetes mellitus with hyperglycemia: Secondary | ICD-10-CM | POA: Diagnosis not present

## 2016-08-13 DIAGNOSIS — Z7689 Persons encountering health services in other specified circumstances: Secondary | ICD-10-CM | POA: Diagnosis not present

## 2016-08-13 DIAGNOSIS — H538 Other visual disturbances: Secondary | ICD-10-CM | POA: Diagnosis not present

## 2016-08-13 DIAGNOSIS — Z0000 Encounter for general adult medical examination without abnormal findings: Secondary | ICD-10-CM | POA: Diagnosis not present

## 2016-08-13 DIAGNOSIS — Z01118 Encounter for examination of ears and hearing with other abnormal findings: Secondary | ICD-10-CM | POA: Diagnosis not present

## 2016-08-13 DIAGNOSIS — G4733 Obstructive sleep apnea (adult) (pediatric): Secondary | ICD-10-CM | POA: Diagnosis not present

## 2016-08-13 DIAGNOSIS — I10 Essential (primary) hypertension: Secondary | ICD-10-CM | POA: Diagnosis not present

## 2016-08-15 DIAGNOSIS — E783 Hyperchylomicronemia: Secondary | ICD-10-CM | POA: Diagnosis not present

## 2016-08-15 DIAGNOSIS — I25119 Atherosclerotic heart disease of native coronary artery with unspecified angina pectoris: Secondary | ICD-10-CM | POA: Diagnosis not present

## 2016-08-15 DIAGNOSIS — I739 Peripheral vascular disease, unspecified: Secondary | ICD-10-CM | POA: Diagnosis not present

## 2016-08-29 DIAGNOSIS — I10 Essential (primary) hypertension: Secondary | ICD-10-CM | POA: Diagnosis not present

## 2016-08-29 DIAGNOSIS — E783 Hyperchylomicronemia: Secondary | ICD-10-CM | POA: Diagnosis not present

## 2016-09-09 DIAGNOSIS — I25119 Atherosclerotic heart disease of native coronary artery with unspecified angina pectoris: Secondary | ICD-10-CM | POA: Diagnosis not present

## 2016-09-09 DIAGNOSIS — R600 Localized edema: Secondary | ICD-10-CM | POA: Diagnosis not present

## 2016-09-09 DIAGNOSIS — I739 Peripheral vascular disease, unspecified: Secondary | ICD-10-CM | POA: Diagnosis not present

## 2016-09-09 DIAGNOSIS — I10 Essential (primary) hypertension: Secondary | ICD-10-CM | POA: Diagnosis not present

## 2016-09-10 DIAGNOSIS — G4733 Obstructive sleep apnea (adult) (pediatric): Secondary | ICD-10-CM | POA: Diagnosis not present

## 2016-10-01 DIAGNOSIS — E1140 Type 2 diabetes mellitus with diabetic neuropathy, unspecified: Secondary | ICD-10-CM | POA: Diagnosis not present

## 2016-10-01 DIAGNOSIS — E1165 Type 2 diabetes mellitus with hyperglycemia: Secondary | ICD-10-CM | POA: Diagnosis not present

## 2016-10-01 DIAGNOSIS — I10 Essential (primary) hypertension: Secondary | ICD-10-CM | POA: Diagnosis not present

## 2016-10-01 DIAGNOSIS — J449 Chronic obstructive pulmonary disease, unspecified: Secondary | ICD-10-CM | POA: Diagnosis not present

## 2016-10-11 DIAGNOSIS — G4733 Obstructive sleep apnea (adult) (pediatric): Secondary | ICD-10-CM | POA: Diagnosis not present

## 2016-10-23 DIAGNOSIS — H6123 Impacted cerumen, bilateral: Secondary | ICD-10-CM | POA: Diagnosis not present

## 2016-10-23 DIAGNOSIS — D3709 Neoplasm of uncertain behavior of other specified sites of the oral cavity: Secondary | ICD-10-CM | POA: Diagnosis not present

## 2016-10-23 DIAGNOSIS — H9313 Tinnitus, bilateral: Secondary | ICD-10-CM | POA: Diagnosis not present

## 2016-11-01 ENCOUNTER — Other Ambulatory Visit: Payer: Self-pay | Admitting: Internal Medicine

## 2016-11-01 DIAGNOSIS — Z1231 Encounter for screening mammogram for malignant neoplasm of breast: Secondary | ICD-10-CM

## 2016-11-10 DIAGNOSIS — G4733 Obstructive sleep apnea (adult) (pediatric): Secondary | ICD-10-CM | POA: Diagnosis not present

## 2016-11-15 DIAGNOSIS — R600 Localized edema: Secondary | ICD-10-CM | POA: Diagnosis not present

## 2016-11-15 DIAGNOSIS — I739 Peripheral vascular disease, unspecified: Secondary | ICD-10-CM | POA: Diagnosis not present

## 2016-11-15 DIAGNOSIS — I10 Essential (primary) hypertension: Secondary | ICD-10-CM | POA: Diagnosis not present

## 2016-11-20 DIAGNOSIS — G4733 Obstructive sleep apnea (adult) (pediatric): Secondary | ICD-10-CM | POA: Diagnosis not present

## 2016-11-27 DIAGNOSIS — M79604 Pain in right leg: Secondary | ICD-10-CM | POA: Diagnosis not present

## 2016-12-11 DIAGNOSIS — G4733 Obstructive sleep apnea (adult) (pediatric): Secondary | ICD-10-CM | POA: Diagnosis not present

## 2016-12-13 ENCOUNTER — Ambulatory Visit
Admission: RE | Admit: 2016-12-13 | Discharge: 2016-12-13 | Disposition: A | Discharge status: Home / Self Care | Payer: Medicare Other | Source: Ambulatory Visit | Attending: Internal Medicine | Admitting: Internal Medicine

## 2016-12-13 DIAGNOSIS — Z1231 Encounter for screening mammogram for malignant neoplasm of breast: Secondary | ICD-10-CM

## 2016-12-22 ENCOUNTER — Encounter (HOSPITAL_COMMUNITY): Payer: Self-pay | Admitting: *Deleted

## 2016-12-22 ENCOUNTER — Emergency Department (HOSPITAL_COMMUNITY)
Admission: EM | Admit: 2016-12-22 | Discharge: 2016-12-22 | Disposition: A | Discharge status: Home / Self Care | Payer: Medicare Other | Attending: Emergency Medicine | Admitting: Emergency Medicine

## 2016-12-22 DIAGNOSIS — Z79899 Other long term (current) drug therapy: Secondary | ICD-10-CM | POA: Diagnosis not present

## 2016-12-22 DIAGNOSIS — I10 Essential (primary) hypertension: Secondary | ICD-10-CM | POA: Insufficient documentation

## 2016-12-22 DIAGNOSIS — R51 Headache: Secondary | ICD-10-CM | POA: Insufficient documentation

## 2016-12-22 DIAGNOSIS — E119 Type 2 diabetes mellitus without complications: Secondary | ICD-10-CM | POA: Insufficient documentation

## 2016-12-22 DIAGNOSIS — Z7984 Long term (current) use of oral hypoglycemic drugs: Secondary | ICD-10-CM | POA: Diagnosis not present

## 2016-12-22 DIAGNOSIS — G44039 Episodic paroxysmal hemicrania, not intractable: Secondary | ICD-10-CM

## 2016-12-22 DIAGNOSIS — F419 Anxiety disorder, unspecified: Secondary | ICD-10-CM | POA: Diagnosis not present

## 2016-12-22 DIAGNOSIS — F17210 Nicotine dependence, cigarettes, uncomplicated: Secondary | ICD-10-CM | POA: Diagnosis not present

## 2016-12-22 DIAGNOSIS — J449 Chronic obstructive pulmonary disease, unspecified: Secondary | ICD-10-CM | POA: Diagnosis not present

## 2016-12-22 LAB — CBG MONITORING, ED: Glucose-Capillary: 96 mg/dL (ref 65–99)

## 2016-12-22 MED ORDER — ACETAMINOPHEN 325 MG PO TABS
650.0000 mg | ORAL_TABLET | Freq: Once | ORAL | Status: AC
Start: 2016-12-22 — End: 2016-12-22
  Administered 2016-12-22: 650 mg via ORAL
  Filled 2016-12-22: qty 2

## 2016-12-22 NOTE — ED Notes (Signed)
Patient Alert and oriented X4. Stable and ambulatory. Patient verbalized understanding of the discharge instructions.  Patient belongings were taken by the patient.  

## 2016-12-22 NOTE — ED Triage Notes (Signed)
Pt reports having dull headache to left side of head x 1 week. Has not taken any otc meds for it pta. Denies n/v. No acute distress is noted at triage.

## 2016-12-22 NOTE — ED Provider Notes (Addendum)
[facility] DEPT Provider Note   CSN: [id number] Arrival date & time: 12/22/16  1013     History   Chief Complaint Chief Complaint  Patient presents with  . Headache    HPI [name] is a 66 y.o. female.  HPI Complains of left-sided parietal headache gradual onset 1 week ago pain is mild to moderate. No fever no nausea or vomiting. No blurred vision no trauma. No focal numbness or weakness. No change in speech. No other associated symptoms. Nothing makes symptoms better or worse. No treatment prior to coming here Past Medical History:  Diagnosis Date  . Anemia 12/26/2015  . Anxiety   . Arthritis   . Chronic pain   . COPD (chronic obstructive pulmonary disease) ([place])   . Deep vein blood clot of right lower extremity ([place]) 12/29/2012   Dr. Nadyne Coombes, stent in place  . Depression   . Diabetes mellitus without complication ([place])   . GERD (gastroesophageal reflux disease)   . Heart murmur   . Hyperlipemia   . Hypertension   . Sleep apnea    uses a cpap  . Wears glasses     Patient Active Problem List   Diagnosis Date Noted  . Symptomatic anemia 12/26/2015  . Hypokalemia 12/26/2015  . Type 2 diabetes mellitus with peripheral vascular disease ([place]) 12/26/2015  . Claudication in peripheral vascular disease ([place]) 10/05/2013  . Discomfort in chest 05/11/2012  . Nicotine dependence 05/11/2012  . Bradycardia 05/11/2012  . HTN (hypertension) 05/11/2012  . Back pain 05/11/2012    Past Surgical History:  Procedure Laterality Date  . ABDOMINAL HYSTERECTOMY    . CARDIAC CATHETERIZATION  4/15  . CESAREAN SECTION    . COLONOSCOPY N/A 01/08/2013   Procedure: COLONOSCOPY;  Surgeon: Beryle Beams, MD;  Location: [facility] ENDOSCOPY;  Service: Endoscopy;  Laterality: N/A;  . COLONOSCOPY N/A 12/28/2015   Procedure: COLONOSCOPY;  Surgeon: Carol Ada, MD;  Location: [facility] ENDOSCOPY;  Service: Endoscopy;  Laterality: N/A;  . ENTEROSCOPY N/A 12/28/2015   Procedure: ENTEROSCOPY;  Surgeon:  Carol Ada, MD;  Location: [place];  Service: Endoscopy;  Laterality: N/A;  . ESOPHAGOGASTRODUODENOSCOPY N/A 01/08/2013   Procedure: ESOPHAGOGASTRODUODENOSCOPY (EGD);  Surgeon: Beryle Beams, MD;  Location: [facility] ENDOSCOPY;  Service: Endoscopy;  Laterality: N/A;  . HAND SURGERY    . HOT HEMOSTASIS N/A 12/28/2015   Procedure: HOT HEMOSTASIS (ARGON PLASMA COAGULATION/BICAP);  Surgeon: Carol Ada, MD;  Location: [facility] ENDOSCOPY;  Service: Endoscopy;  Laterality: N/A;  . LEFT HEART CATHETERIZATION WITH CORONARY ANGIOGRAM N/A 03/09/2013   Procedure: LEFT HEART CATHETERIZATION WITH CORONARY ANGIOGRAM;  Surgeon: Laverda Page, MD;  Location: [facility] CATH LAB;  Service: Cardiovascular;  Laterality: N/A;  . LOWER EXTREMITY ANGIOGRAM N/A 12/29/2012   Procedure: LOWER EXTREMITY ANGIOGRAM;  Surgeon: Laverda Page, MD;  Location: [facility] CATH LAB;  Service: Cardiovascular;  Laterality: N/A;  . LOWER EXTREMITY ANGIOGRAM N/A 10/05/2013   Procedure: LOWER EXTREMITY ANGIOGRAM;  Surgeon: Laverda Page, MD;  Location: [facility] CATH LAB;  Service: Cardiovascular;  Laterality: N/A;  . stent in right leg  12/29/2012   for blood clot, Dr. Nadyne Coombes  . TYMPANOMASTOIDECTOMY Left 03/08/2014   Procedure: TYMPANOMASTOIDECTOMY LEFT;  Surgeon: Ascencion Dike, MD;  Location: [facility];  Service: ENT;  Laterality: Left;  . TYMPANOMASTOIDECTOMY  2015  . UTERINE FIBROID SURGERY      OB History    No data available       Home Medications    Prior  to Admission medications   Medication Sig Start Date End Date Taking? Authorizing Provider  amLODipine (NORVASC) 10 MG tablet Take 1 tablet (10 mg total) by mouth daily. 11/23/13   Milton Ferguson, MD  atorvastatin (LIPITOR) 40 MG tablet Take 40 mg by mouth daily. 09/08/13   [provider]  gabapentin (NEURONTIN) 600 MG tablet Take 600 mg by mouth 2 (two) times daily. 11/14/15   [provider]  metFORMIN (GLUCOPHAGE) 500 MG tablet Take 0.5 tablets (250 mg total)  by mouth 2 (two) times daily with a meal. 10/07/13   Adrian Prows, MD  nitroGLYCERIN (NITROSTAT) 0.4 MG SL tablet Place 0.4 mg under the tongue every 5 (five) minutes as needed for chest pain.    [provider]  pantoprazole (PROTONIX) 40 MG tablet Take 1 tablet (40 mg total) by mouth daily. 12/28/15   Rai, Ripudeep K, MD  zolpidem (AMBIEN) 10 MG tablet Take 1 tablet (10 mg total) by mouth at bedtime as needed for sleep. Take 8 hours before you need to drive or operate machinery in AM. 02/05/16 03/06/16  Dohmeier, Asencion Partridge, MD    Family History Family History  Problem Relation Age of Onset  . Diabetes Mother   . Hypertension Mother   . Heart disease Mother   . Heart disease Father   . Hypertension Father     Social History Social History  Substance Use Topics  . Smoking status: Current Every Day Smoker    Packs/day: 0.25    Years: 41.00    Types: Cigarettes  . Smokeless tobacco: Never Used  . Alcohol use No     Allergies   Patient has no known allergies.   Review of Systems Review of Systems  Constitutional: Negative.   HENT: Negative.   Respiratory: Negative.   Cardiovascular: Negative.   Gastrointestinal: Negative.   Musculoskeletal: Negative.   Skin: Negative.   Allergic/Immunologic: Positive for immunocompromised state.       Diabetic  Neurological: Positive for headaches.  Psychiatric/Behavioral: Negative.   All other systems reviewed and are negative.    Physical Exam Updated Vital Signs BP 135/69 (BP Location: Left Arm)   Pulse (!) 54   Temp 97.8 F (36.6 C) (Oral)   Resp 18   SpO2 100%   Physical Exam  Constitutional: She is oriented to person, place, and time. She appears well-developed and well-nourished.  HENT:  Head: Normocephalic and atraumatic.  Eyes: Conjunctivae are normal. Pupils are equal, round, and reactive to light.  Optic discs sharp  Neck: Neck supple. No tracheal deviation present. No thyromegaly present.  Cardiovascular:  Regular rhythm.   No murmur heard. Mother bradycardic  Pulmonary/Chest: Effort normal and breath sounds normal.  Abdominal: Soft. Bowel sounds are normal. She exhibits no distension. There is no tenderness.  Obese  Musculoskeletal: Normal range of motion. She exhibits no edema or tenderness.  Neurological: She is alert and oriented to person, place, and time. Coordination normal.  Gait normal Romberg normal pronator drift normal finger to nose normal DTR symmetric bilaterally at knee jerk ankle jerk and biceps toes downward going bilaterally  Skin: Skin is warm and dry. No rash noted.  Psychiatric: She has a normal mood and affect.  Nursing note and vitals reviewed.    ED Treatments / Results  Labs (all labs ordered are listed, but only abnormal results are displayed) Labs Reviewed - No data to display  EKG  EKG Interpretation None       Radiology No results found.  Procedures Procedures (including critical care time)  Medications Ordered in ED Medications - No data to display   Initial Impression / Assessment and Plan / ED Course  I have reviewed the triage vital signs and the nursing notes.  Pertinent labs & imaging results that were available during my care of the patient were reviewed by me and considered in my medical decision making (see chart for details).     11:50 AM patient resting comfortably after treatment with Tylenol. She is alert and in no distress. I counseled patient for 5 minutes on smoking cessation. Tylenol for pain. Keep scheduled appointment with Dr.Osei-bonsu next week Results for orders placed or performed during the hospital encounter of 12/22/16  CBG monitoring, ED  Result Value Ref Range   Glucose-Capillary 96 65 - 99 mg/dL   Mm Digital Screening Bilateral  Result Date: 12/13/2016 CLINICAL DATA:  Screening. EXAM: DIGITAL SCREENING BILATERAL MAMMOGRAM WITH CAD COMPARISON:  Previous exam(s). ACR Breast Density Category c: The breast tissue  is heterogeneously dense, which may obscure small masses. FINDINGS: There are no findings suspicious for malignancy. Images were processed with CAD. IMPRESSION: No mammographic evidence of malignancy. A result letter of this screening mammogram will be mailed directly to the patient. RECOMMENDATION: Screening mammogram in one year. (Code:SM-B-01Y) BI-RADS CATEGORY  1: Negative. Electronically Signed   By: Nolon Nations M.D.   On: 12/13/2016 09:37   Final Clinical Impressions(s) / ED Diagnoses  Diagnosis #1 headache Final diagnoses:  None   #2 tobacco abuse New Prescriptions New Prescriptions   No medications on file     Orlie Dakin, MD 12/22/16 1155    Orlie Dakin, MD 12/22/16 1155

## 2016-12-22 NOTE — Discharge Instructions (Signed)
It is safe to take Tylenol for pain. Keep your scheduled appointment with Dr.Osei-bonsu next week. Ask him for help to stop smoking as smoking is a risk factor for heart attack and stroke

## 2016-12-24 DIAGNOSIS — D3709 Neoplasm of uncertain behavior of other specified sites of the oral cavity: Secondary | ICD-10-CM | POA: Diagnosis not present

## 2016-12-27 DIAGNOSIS — E782 Mixed hyperlipidemia: Secondary | ICD-10-CM | POA: Diagnosis not present

## 2016-12-27 DIAGNOSIS — E1151 Type 2 diabetes mellitus with diabetic peripheral angiopathy without gangrene: Secondary | ICD-10-CM | POA: Diagnosis not present

## 2016-12-27 DIAGNOSIS — I739 Peripheral vascular disease, unspecified: Secondary | ICD-10-CM | POA: Diagnosis not present

## 2016-12-27 DIAGNOSIS — I10 Essential (primary) hypertension: Secondary | ICD-10-CM | POA: Diagnosis not present

## 2016-12-31 DIAGNOSIS — E1165 Type 2 diabetes mellitus with hyperglycemia: Secondary | ICD-10-CM | POA: Diagnosis not present

## 2016-12-31 DIAGNOSIS — I10 Essential (primary) hypertension: Secondary | ICD-10-CM | POA: Diagnosis not present

## 2017-01-07 DIAGNOSIS — R51 Headache: Secondary | ICD-10-CM | POA: Diagnosis not present

## 2017-01-10 DIAGNOSIS — G4733 Obstructive sleep apnea (adult) (pediatric): Secondary | ICD-10-CM | POA: Diagnosis not present

## 2017-01-15 DIAGNOSIS — R51 Headache: Secondary | ICD-10-CM | POA: Diagnosis not present

## 2017-01-15 DIAGNOSIS — Z8673 Personal history of transient ischemic attack (TIA), and cerebral infarction without residual deficits: Secondary | ICD-10-CM | POA: Diagnosis not present

## 2017-02-05 DIAGNOSIS — I739 Peripheral vascular disease, unspecified: Secondary | ICD-10-CM | POA: Diagnosis not present

## 2017-02-05 DIAGNOSIS — R600 Localized edema: Secondary | ICD-10-CM | POA: Diagnosis not present
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
## 2017-02-11 ENCOUNTER — Ambulatory Visit (HOSPITAL_COMMUNITY)
Admission: RE | Admit: 2017-02-11 | Discharge: 2017-02-11 | Disposition: A | Discharge status: Home / Self Care | Payer: Medicare Other | Source: Ambulatory Visit | Attending: Cardiology | Admitting: Cardiology

## 2017-02-11 ENCOUNTER — Encounter (HOSPITAL_COMMUNITY)
Admission: RE | Disposition: A | Discharge status: Home / Self Care | Payer: Self-pay | Source: Ambulatory Visit | Attending: Cardiology

## 2017-02-11 DIAGNOSIS — I10 Essential (primary) hypertension: Secondary | ICD-10-CM | POA: Insufficient documentation

## 2017-02-11 DIAGNOSIS — Z90710 Acquired absence of both cervix and uterus: Secondary | ICD-10-CM | POA: Insufficient documentation

## 2017-02-11 DIAGNOSIS — Z79899 Other long term (current) drug therapy: Secondary | ICD-10-CM | POA: Diagnosis not present

## 2017-02-11 DIAGNOSIS — I739 Peripheral vascular disease, unspecified: Secondary | ICD-10-CM | POA: Diagnosis present

## 2017-02-11 DIAGNOSIS — F17210 Nicotine dependence, cigarettes, uncomplicated: Secondary | ICD-10-CM | POA: Insufficient documentation

## 2017-02-11 DIAGNOSIS — E782 Mixed hyperlipidemia: Secondary | ICD-10-CM | POA: Insufficient documentation

## 2017-02-11 DIAGNOSIS — Z6841 Body Mass Index (BMI) 40.0 and over, adult: Secondary | ICD-10-CM | POA: Diagnosis not present

## 2017-02-11 DIAGNOSIS — Z7984 Long term (current) use of oral hypoglycemic drugs: Secondary | ICD-10-CM | POA: Diagnosis not present

## 2017-02-11 DIAGNOSIS — Z7982 Long term (current) use of aspirin: Secondary | ICD-10-CM | POA: Diagnosis not present

## 2017-02-11 DIAGNOSIS — I70211 Atherosclerosis of native arteries of extremities with intermittent claudication, right leg: Secondary | ICD-10-CM | POA: Diagnosis not present

## 2017-02-11 DIAGNOSIS — E1151 Type 2 diabetes mellitus with diabetic peripheral angiopathy without gangrene: Secondary | ICD-10-CM | POA: Insufficient documentation

## 2017-02-11 DIAGNOSIS — E662 Morbid (severe) obesity with alveolar hypoventilation: Secondary | ICD-10-CM | POA: Diagnosis not present

## 2017-02-11 HISTORY — PX: PERIPHERAL VASCULAR INTERVENTION: CATH118257

## 2017-02-11 HISTORY — PX: LOWER EXTREMITY ANGIOGRAPHY: CATH118251

## 2017-02-11 HISTORY — PX: PERIPHERAL VASCULAR BALLOON ANGIOPLASTY: CATH118281

## 2017-02-11 LAB — POCT ACTIVATED CLOTTING TIME
ACTIVATED CLOTTING TIME: 202 s
ACTIVATED CLOTTING TIME: 274 s
ACTIVATED CLOTTING TIME: 301 s
Activated Clotting Time: 208 seconds
Activated Clotting Time: 164 seconds

## 2017-02-11 LAB — GLUCOSE, CAPILLARY
GLUCOSE-CAPILLARY: 98 mg/dL (ref 65–99)
Glucose-Capillary: 79 mg/dL (ref 65–99)

## 2017-02-11 SURGERY — LOWER EXTREMITY ANGIOGRAPHY
Anesthesia: LOCAL | Laterality: Right

## 2017-02-11 MED ORDER — SODIUM CHLORIDE 0.9% FLUSH: 3.0000 mL | INTRAVENOUS | Status: DC | PRN

## 2017-02-11 MED ORDER — HEPARIN SODIUM (PORCINE) 1000 UNIT/ML IJ SOLN
INTRAMUSCULAR | Status: DC | PRN
  Administered 2017-02-11: 2000 [IU] via INTRAVENOUS
  Administered 2017-02-11: 4000 [IU] via INTRAVENOUS
  Administered 2017-02-11 (×2): 2000 [IU] via INTRAVENOUS

## 2017-02-11 MED ORDER — CLOPIDOGREL BISULFATE 300 MG PO TABS
ORAL_TABLET | ORAL | Status: AC
  Filled 2017-02-11: qty 2

## 2017-02-11 MED ORDER — IODIXANOL 320 MG/ML IV SOLN
INTRAVENOUS | Status: DC | PRN
  Administered 2017-02-11: 175 mL via INTRA_ARTERIAL

## 2017-02-11 MED ORDER — SODIUM CHLORIDE 0.9 % IV SOLN: Freq: Once | INTRAVENOUS | Status: DC

## 2017-02-11 MED ORDER — SODIUM CHLORIDE 0.9 % IV SOLN: 250.0000 mL | INTRAVENOUS | Status: DC | PRN

## 2017-02-11 MED ORDER — CLOPIDOGREL BISULFATE 75 MG PO TABS: 75.0000 mg | ORAL_TABLET | Freq: Every day | ORAL | Status: DC

## 2017-02-11 MED ORDER — FENTANYL CITRATE (PF) 100 MCG/2ML IJ SOLN
INTRAMUSCULAR | Status: AC
  Filled 2017-02-11: qty 2

## 2017-02-11 MED ORDER — NITROGLYCERIN 1 MG/10 ML FOR IR/CATH LAB
INTRA_ARTERIAL | Status: DC | PRN
  Administered 2017-02-11 (×2): 500 ug via INTRA_ARTERIAL

## 2017-02-11 MED ORDER — HYDRALAZINE HCL 20 MG/ML IJ SOLN: 5.0000 mg | INTRAMUSCULAR | Status: DC | PRN

## 2017-02-11 MED ORDER — FENTANYL CITRATE (PF) 100 MCG/2ML IJ SOLN
INTRAMUSCULAR | Status: DC | PRN
  Administered 2017-02-11: 25 ug via INTRAVENOUS
  Administered 2017-02-11: 50 ug via INTRAVENOUS
  Administered 2017-02-11: 25 ug via INTRAVENOUS
  Administered 2017-02-11: 50 ug via INTRAVENOUS

## 2017-02-11 MED ORDER — LABETALOL HCL 5 MG/ML IV SOLN: 10.0000 mg | INTRAVENOUS | Status: DC | PRN

## 2017-02-11 MED ORDER — FENTANYL CITRATE (PF) 100 MCG/2ML IJ SOLN
50.0000 ug | Freq: Once | INTRAMUSCULAR | Status: AC
  Administered 2017-02-11: 50 ug via INTRAVENOUS

## 2017-02-11 MED ORDER — HEPARIN SODIUM (PORCINE) 1000 UNIT/ML IJ SOLN
INTRAMUSCULAR | Status: AC
  Filled 2017-02-11: qty 1

## 2017-02-11 MED ORDER — LIDOCAINE HCL (PF) 1 % IJ SOLN
INTRAMUSCULAR | Status: DC | PRN
  Administered 2017-02-11: 25 mL

## 2017-02-11 MED ORDER — MIDAZOLAM HCL 2 MG/2ML IJ SOLN
INTRAMUSCULAR | Status: DC | PRN
  Administered 2017-02-11: 1 mg via INTRAVENOUS
  Administered 2017-02-11: 2 mg via INTRAVENOUS

## 2017-02-11 MED ORDER — MIDAZOLAM HCL 2 MG/2ML IJ SOLN
INTRAMUSCULAR | Status: AC
  Filled 2017-02-11: qty 2

## 2017-02-11 MED ORDER — SODIUM CHLORIDE 0.9 % WEIGHT BASED INFUSION: 1.0000 mL/kg/h | INTRAVENOUS | Status: AC

## 2017-02-11 MED ORDER — LIDOCAINE HCL (PF) 1 % IJ SOLN
INTRAMUSCULAR | Status: AC
  Filled 2017-02-11: qty 30

## 2017-02-11 MED ORDER — METFORMIN HCL 500 MG PO TABS: 250.0000 mg | ORAL_TABLET | Freq: Two times a day (BID) | ORAL | Status: DC

## 2017-02-11 MED ORDER — FENTANYL CITRATE (PF) 100 MCG/2ML IJ SOLN
INTRAMUSCULAR | Status: AC
  Administered 2017-02-11: 50 ug via INTRAVENOUS
  Filled 2017-02-11: qty 2

## 2017-02-11 MED ORDER — CLOPIDOGREL BISULFATE 75 MG PO TABS
300.0000 mg | ORAL_TABLET | Freq: Once | ORAL | Status: AC
  Administered 2017-02-11: 300 mg via ORAL

## 2017-02-11 MED ORDER — SODIUM CHLORIDE 0.9% FLUSH: 3.0000 mL | Freq: Two times a day (BID) | INTRAVENOUS | Status: DC

## 2017-02-11 MED ORDER — HEPARIN (PORCINE) IN NACL 2-0.9 UNIT/ML-% IJ SOLN
INTRAMUSCULAR | Status: AC | PRN
  Administered 2017-02-11: 1500 mL

## 2017-02-11 MED ORDER — SODIUM CHLORIDE 0.9 % IV SOLN
INTRAVENOUS | Status: DC
  Administered 2017-02-11: 07:00:00 via INTRAVENOUS

## 2017-02-11 MED ORDER — CLOPIDOGREL BISULFATE 75 MG PO TABS
75.0000 mg | ORAL_TABLET | Freq: Every day | ORAL | 1 refills | Status: DC
Start: 2017-02-11 — End: 2017-02-11

## 2017-02-11 MED ORDER — NITROGLYCERIN 1 MG/10 ML FOR IR/CATH LAB
INTRA_ARTERIAL | Status: AC
  Filled 2017-02-11: qty 10

## 2017-02-11 SURGICAL SUPPLY — 31 items
BALLN ARMADA 4.0X200X150 (BALLOONS) ×4
BALLN IN.PACT DCB 5X150 (BALLOONS) ×8
BALLN STERLING OTW 6X40X135 (BALLOONS) ×4
BALLOON ARMADA 4.0X200X150 (BALLOONS) IMPLANT
BALLOON STERLING OTW 6X40X135 (BALLOONS) IMPLANT
CATH OMNI FLUSH 5F 65CM (CATHETERS) ×1 IMPLANT
CATH QUICKCROSS SUPP .018X90CM (MICROCATHETER) ×1 IMPLANT
COVER PRB 48X5XTLSCP FOLD TPE (BAG) IMPLANT
COVER PROBE 5X48 (BAG) ×4
DCB IN.PACT 5X150 (BALLOONS) IMPLANT
DEVICE TORQUE .014-.018 (MISCELLANEOUS) IMPLANT
GUIDEWIRE ANGLED .035X150CM (WIRE) ×1 IMPLANT
GUIDEWIRE STR TIP .014X300X8 (WIRE) ×1 IMPLANT
KIT ENCORE 26 ADVANTAGE (KITS) ×1 IMPLANT
KIT MICROINTRODUCER STIFF 5F (SHEATH) ×1 IMPLANT
KIT PV (KITS) ×4 IMPLANT
SHEATH FLEX ANSEL ANG 5F 45CM (SHEATH) ×1 IMPLANT
SHEATH PINNACLE 5F 10CM (SHEATH) ×1 IMPLANT
SHEATH PINNACLE MP 6F 45CM (SHEATH) ×1 IMPLANT
STENT ZILVER PTX 6X100 (Permanent Stent) ×1 IMPLANT
STOPCOCK MORSE 400PSI 3WAY (MISCELLANEOUS) ×1 IMPLANT
SYRINGE MEDRAD AVANTA MACH 7 (SYRINGE) ×1 IMPLANT
TAPE RADIOPAQUE TURBO (MISCELLANEOUS) ×1 IMPLANT
TORQUE DEVICE .014-.018 (MISCELLANEOUS) ×4
TRANSDUCER W/STOPCOCK (MISCELLANEOUS) ×4 IMPLANT
TRAY PV CATH (CUSTOM PROCEDURE TRAY) ×4 IMPLANT
TUBING CIL FLEX 10 FLL-RA (TUBING) ×1 IMPLANT
WIRE HI TORQ COMMND ES.014X300 (WIRE) ×1 IMPLANT
WIRE HITORQ VERSACORE ST 145CM (WIRE) ×1 IMPLANT
WIRE SPARTACORE .014X300CM (WIRE) ×1 IMPLANT
WIRE TORQFLEX AUST .018X40CM (WIRE) ×1 IMPLANT

## 2017-02-11 NOTE — Interval H&P Note (Signed)
History and Physical Interval Note:  02/11/2017 7:27 AM  [name]  has presented today for surgery, with the diagnosis of pvd with claudication  The various methods of treatment have been discussed with the patient and family. After consideration of risks, benefits and other options for treatment, the patient has consented to  Procedure(s): Lower Extremity Angiography (N/A) and possible angioplasty as a surgical intervention due to [place] 3 class claudication.  The patient's history has been reviewed, patient examined, no change in status, stable for surgery.  I have reviewed the patient's chart and labs.  Questions were answered to the patient's satisfaction.     Adrian Prows

## 2017-02-11 NOTE — Discharge Instructions (Signed)

## 2017-02-11 NOTE — Progress Notes (Signed)
Client c/o pain in pelvic area and right leg and Dr Einar Gip notified and order noted and med given

## 2017-02-11 NOTE — Progress Notes (Signed)
Site area: Right groin a 5 french arterial sheath was removed  Site Prior to Removal:  Level 0  Pressure Applied For 20 MINUTES    Bedrest Beginning at 1250p  Manual:   Yes.    Patient Status During Pull:  stable  Post Pull Groin Site:  Level 0  Post Pull Instructions Given:  Yes.    Post Pull Pulses Present:  Yes.    Dressing Applied:  Yes.    Comments:  VS remain stable during sheath pull.

## 2017-02-12 ENCOUNTER — Encounter (HOSPITAL_COMMUNITY): Payer: Self-pay | Admitting: Cardiology

## 2017-02-21 DIAGNOSIS — E1151 Type 2 diabetes mellitus with diabetic peripheral angiopathy without gangrene: Secondary | ICD-10-CM | POA: Diagnosis not present

## 2017-02-21 DIAGNOSIS — I739 Peripheral vascular disease, unspecified: Secondary | ICD-10-CM | POA: Diagnosis not present

## 2017-03-11 DIAGNOSIS — I739 Peripheral vascular disease, unspecified: Secondary | ICD-10-CM | POA: Diagnosis not present

## 2017-04-15 DIAGNOSIS — J449 Chronic obstructive pulmonary disease, unspecified: Secondary | ICD-10-CM | POA: Diagnosis not present

## 2017-04-15 DIAGNOSIS — E1140 Type 2 diabetes mellitus with diabetic neuropathy, unspecified: Secondary | ICD-10-CM | POA: Diagnosis not present

## 2017-04-15 DIAGNOSIS — I739 Peripheral vascular disease, unspecified: Secondary | ICD-10-CM | POA: Diagnosis not present

## 2017-04-15 DIAGNOSIS — Z23 Encounter for immunization: Secondary | ICD-10-CM | POA: Diagnosis not present

## 2017-04-15 DIAGNOSIS — E1165 Type 2 diabetes mellitus with hyperglycemia: Secondary | ICD-10-CM | POA: Diagnosis not present

## 2017-04-15 DIAGNOSIS — I10 Essential (primary) hypertension: Secondary | ICD-10-CM | POA: Diagnosis not present

## 2017-04-21 DIAGNOSIS — J449 Chronic obstructive pulmonary disease, unspecified: Secondary | ICD-10-CM | POA: Diagnosis not present

## 2017-04-21 DIAGNOSIS — I10 Essential (primary) hypertension: Secondary | ICD-10-CM | POA: Diagnosis not present

## 2017-04-21 DIAGNOSIS — Z0000 Encounter for general adult medical examination without abnormal findings: Secondary | ICD-10-CM | POA: Diagnosis not present

## 2017-04-21 DIAGNOSIS — E1165 Type 2 diabetes mellitus with hyperglycemia: Secondary | ICD-10-CM | POA: Diagnosis not present

## 2017-04-21 DIAGNOSIS — E1140 Type 2 diabetes mellitus with diabetic neuropathy, unspecified: Secondary | ICD-10-CM | POA: Diagnosis not present

## 2017-05-06 DIAGNOSIS — R0609 Other forms of dyspnea: Secondary | ICD-10-CM | POA: Diagnosis not present

## 2017-05-06 DIAGNOSIS — I739 Peripheral vascular disease, unspecified: Secondary | ICD-10-CM | POA: Diagnosis not present

## 2017-05-06 DIAGNOSIS — R072 Precordial pain: Secondary | ICD-10-CM | POA: Diagnosis not present

## 2017-05-09 DIAGNOSIS — R072 Precordial pain: Secondary | ICD-10-CM | POA: Diagnosis not present

## 2017-05-09 DIAGNOSIS — R0602 Shortness of breath: Secondary | ICD-10-CM | POA: Diagnosis not present

## 2017-05-19 DIAGNOSIS — R0602 Shortness of breath: Secondary | ICD-10-CM | POA: Diagnosis not present

## 2017-05-19 DIAGNOSIS — R0789 Other chest pain: Secondary | ICD-10-CM | POA: Diagnosis not present

## 2017-05-28 DIAGNOSIS — I739 Peripheral vascular disease, unspecified: Secondary | ICD-10-CM | POA: Diagnosis not present

## 2017-06-13 DIAGNOSIS — I739 Peripheral vascular disease, unspecified: Secondary | ICD-10-CM | POA: Diagnosis not present

## 2017-07-01 DIAGNOSIS — H6123 Impacted cerumen, bilateral: Secondary | ICD-10-CM | POA: Diagnosis not present

## 2017-07-01 DIAGNOSIS — D3702 Neoplasm of uncertain behavior of tongue: Secondary | ICD-10-CM | POA: Diagnosis not present

## 2017-08-14 DIAGNOSIS — Z01118 Encounter for examination of ears and hearing with other abnormal findings: Secondary | ICD-10-CM | POA: Diagnosis not present

## 2017-08-14 DIAGNOSIS — Z23 Encounter for immunization: Secondary | ICD-10-CM | POA: Diagnosis not present

## 2017-08-14 DIAGNOSIS — E1140 Type 2 diabetes mellitus with diabetic neuropathy, unspecified: Secondary | ICD-10-CM | POA: Diagnosis not present

## 2017-08-14 DIAGNOSIS — H538 Other visual disturbances: Secondary | ICD-10-CM | POA: Diagnosis not present

## 2017-08-14 DIAGNOSIS — J449 Chronic obstructive pulmonary disease, unspecified: Secondary | ICD-10-CM | POA: Diagnosis not present

## 2017-08-14 DIAGNOSIS — Z5181 Encounter for therapeutic drug level monitoring: Secondary | ICD-10-CM | POA: Diagnosis not present

## 2017-08-14 DIAGNOSIS — E1165 Type 2 diabetes mellitus with hyperglycemia: Secondary | ICD-10-CM | POA: Diagnosis not present

## 2017-08-14 DIAGNOSIS — Z0000 Encounter for general adult medical examination without abnormal findings: Secondary | ICD-10-CM | POA: Diagnosis not present

## 2017-08-15 DIAGNOSIS — I739 Peripheral vascular disease, unspecified: Secondary | ICD-10-CM | POA: Diagnosis not present

## 2017-09-11 DIAGNOSIS — I10 Essential (primary) hypertension: Secondary | ICD-10-CM | POA: Diagnosis not present

## 2017-09-11 DIAGNOSIS — J449 Chronic obstructive pulmonary disease, unspecified: Secondary | ICD-10-CM | POA: Diagnosis not present

## 2017-09-11 DIAGNOSIS — E785 Hyperlipidemia, unspecified: Secondary | ICD-10-CM | POA: Diagnosis not present

## 2017-09-11 DIAGNOSIS — E1140 Type 2 diabetes mellitus with diabetic neuropathy, unspecified: Secondary | ICD-10-CM | POA: Diagnosis not present

## 2017-09-23 ENCOUNTER — Ambulatory Visit: Payer: Medicare Other | Admitting: Podiatry

## 2017-10-02 ENCOUNTER — Ambulatory Visit (INDEPENDENT_AMBULATORY_CARE_PROVIDER_SITE_OTHER): Payer: Medicare Other | Admitting: Podiatry

## 2017-10-02 ENCOUNTER — Encounter: Payer: Self-pay | Admitting: Podiatry

## 2017-10-02 ENCOUNTER — Ambulatory Visit: Payer: Medicare Other

## 2017-10-02 VITALS — BP 127/72 | HR 61 | Resp 16

## 2017-10-02 DIAGNOSIS — D689 Coagulation defect, unspecified: Secondary | ICD-10-CM

## 2017-10-02 DIAGNOSIS — M79671 Pain in right foot: Secondary | ICD-10-CM

## 2017-10-02 DIAGNOSIS — M79672 Pain in left foot: Secondary | ICD-10-CM | POA: Diagnosis not present

## 2017-10-02 DIAGNOSIS — E1151 Type 2 diabetes mellitus with diabetic peripheral angiopathy without gangrene: Secondary | ICD-10-CM

## 2017-10-02 DIAGNOSIS — E1142 Type 2 diabetes mellitus with diabetic polyneuropathy: Secondary | ICD-10-CM | POA: Diagnosis not present

## 2017-10-02 DIAGNOSIS — B351 Tinea unguium: Secondary | ICD-10-CM | POA: Diagnosis not present

## 2017-10-02 DIAGNOSIS — M79676 Pain in unspecified toe(s): Secondary | ICD-10-CM

## 2017-10-02 MED ORDER — GABAPENTIN 600 MG PO TABS: 600.0000 mg | ORAL_TABLET | Freq: Every day | ORAL | 11 refills | Status: AC

## 2017-10-02 NOTE — Progress Notes (Signed)
Subjective:  Patient ID: [name], female    DOB: 02/17/1951,  MRN: [id number] HPI Chief Complaint  Patient presents with  . Foot Pain    Diabetic - patient states she is having severe itching and some burning mostly at night, can't reach to cut her toenails-requesting nail trim    67 y.o. female presents with the above complaint.   ROS: Denies fever chills nausea vomiting muscle aches pains calf pain chest pain shortness of breath headaches.  Past Medical History:  Diagnosis Date  . Anemia 12/26/2015  . Anxiety   . Arthritis   . Chronic pain   . COPD (chronic obstructive pulmonary disease) ([place])   . Deep vein blood clot of right lower extremity ([place]) 12/29/2012   Dr. Nadyne Coombes, stent in place  . Depression   . Diabetes mellitus without complication ([place])   . GERD (gastroesophageal reflux disease)   . Heart murmur   . Hyperlipemia   . Hypertension   . Sleep apnea    uses a cpap  . Wears glasses    Past Surgical History:  Procedure Laterality Date  . ABDOMINAL HYSTERECTOMY    . CARDIAC CATHETERIZATION  4/15  . CESAREAN SECTION    . COLONOSCOPY N/A 01/08/2013   Procedure: COLONOSCOPY;  Surgeon: Beryle Beams, MD;  Location: [facility] ENDOSCOPY;  Service: Endoscopy;  Laterality: N/A;  . COLONOSCOPY N/A 12/28/2015   Procedure: COLONOSCOPY;  Surgeon: Carol Ada, MD;  Location: [facility] ENDOSCOPY;  Service: Endoscopy;  Laterality: N/A;  . ENTEROSCOPY N/A 12/28/2015   Procedure: ENTEROSCOPY;  Surgeon: Carol Ada, MD;  Location: [place];  Service: Endoscopy;  Laterality: N/A;  . ESOPHAGOGASTRODUODENOSCOPY N/A 01/08/2013   Procedure: ESOPHAGOGASTRODUODENOSCOPY (EGD);  Surgeon: Beryle Beams, MD;  Location: [facility] ENDOSCOPY;  Service: Endoscopy;  Laterality: N/A;  . HAND SURGERY    . HOT HEMOSTASIS N/A 12/28/2015   Procedure: HOT HEMOSTASIS (ARGON PLASMA COAGULATION/BICAP);  Surgeon: Carol Ada, MD;  Location: [facility] ENDOSCOPY;  Service: Endoscopy;  Laterality: N/A;  . LEFT HEART  CATHETERIZATION WITH CORONARY ANGIOGRAM N/A 03/09/2013   Procedure: LEFT HEART CATHETERIZATION WITH CORONARY ANGIOGRAM;  Surgeon: Laverda Page, MD;  Location: [facility] CATH LAB;  Service: Cardiovascular;  Laterality: N/A;  . LOWER EXTREMITY ANGIOGRAM N/A 12/29/2012   Procedure: LOWER EXTREMITY ANGIOGRAM;  Surgeon: Laverda Page, MD;  Location: [facility] CATH LAB;  Service: Cardiovascular;  Laterality: N/A;  . LOWER EXTREMITY ANGIOGRAM N/A 10/05/2013   Procedure: LOWER EXTREMITY ANGIOGRAM;  Surgeon: Laverda Page, MD;  Location: [facility] CATH LAB;  Service: Cardiovascular;  Laterality: N/A;  . LOWER EXTREMITY ANGIOGRAPHY N/A 02/11/2017   Procedure: Lower Extremity Angiography;  Surgeon: Adrian Prows, MD;  Location: [place] CV LAB;  Service: Cardiovascular;  Laterality: N/A;  . PERIPHERAL VASCULAR BALLOON ANGIOPLASTY  02/11/2017   Procedure: PERIPHERAL VASCULAR BALLOON ANGIOPLASTY;  Surgeon: Adrian Prows, MD;  Location: [place] CV LAB;  Service: Cardiovascular;;  Right SFA  . PERIPHERAL VASCULAR INTERVENTION Right 02/11/2017   Procedure: PERIPHERAL VASCULAR INTERVENTION;  Surgeon: Adrian Prows, MD;  Location: [place] CV LAB;  Service: Cardiovascular;  Laterality: Right;  Rt SFA  . stent in right leg  12/29/2012   for blood clot, Dr. Nadyne Coombes  . TYMPANOMASTOIDECTOMY Left 03/08/2014   Procedure: TYMPANOMASTOIDECTOMY LEFT;  Surgeon: Ascencion Dike, MD;  Location: [place];  Service: ENT;  Laterality: Left;  . TYMPANOMASTOIDECTOMY  2015  . UTERINE FIBROID SURGERY      Current Outpatient Medications:  .  albuterol (  PROVENTIL HFA;VENTOLIN HFA) 108 (90 Base) MCG/ACT inhaler, Inhale 1-2 puffs into the lungs every 6 (six) hours as needed for wheezing or shortness of breath., Disp: , Rfl:  .  amLODipine (NORVASC) 10 MG tablet, Take 10 mg by mouth daily., Disp: , Rfl: 0 .  aspirin EC 81 MG tablet, Take 81 mg by mouth daily., Disp: , Rfl:  .  atorvastatin (LIPITOR) 40 MG tablet, Take 40 mg by mouth daily.,  Disp: , Rfl:  .  Blood Glucose Monitoring Suppl (ACCU-CHEK NANO SMARTVIEW) w/Device KIT, See admin instructions., Disp: , Rfl: 0 .  clopidogrel (PLAVIX) 75 MG tablet, Take 1 tablet (75 mg total) by mouth daily with breakfast., Disp: , Rfl:  .  gabapentin (NEURONTIN) 600 MG tablet, Take 1 tablet (600 mg total) by mouth at bedtime., Disp: 30 tablet, Rfl: 11 .  hydrALAZINE (APRESOLINE) 50 MG tablet, Take 50 mg by mouth 3 (three) times daily. , Disp: , Rfl:  .  metFORMIN (GLUCOPHAGE) 500 MG tablet, Take 0.5 tablets (250 mg total) by mouth 2 (two) times daily with a meal., Disp: , Rfl:  .  nitroGLYCERIN (NITROSTAT) 0.4 MG SL tablet, Place 0.4 mg under the tongue every 5 (five) minutes as needed for chest pain., Disp: , Rfl:  .  olmesartan-hydrochlorothiazide (BENICAR HCT) 40-25 MG tablet, , Disp: , Rfl:  .  valsartan-hydrochlorothiazide (DIOVAN-HCT) 320-25 MG tablet, Take 1 tablet by mouth daily., Disp: , Rfl:   No Known Allergies Review of Systems Objective:   Vitals:   10/02/17 1036  BP: 127/72  Pulse: 61  Resp: 16    General: Well developed, nourished, in no acute distress, alert and oriented x3   Dermatological: Skin is warm, dry and supple bilateral. Nails x 10 are well maintained; remaining integument appears unremarkable at this time. There are no open sores, no preulcerative lesions, no rash or signs of infection present.  Toenails are long thick yellow dystrophic and painful palpation as well as debridement.  Vascular: Dorsalis Pedis artery and Posterior Tibial artery pedal pulses are 2/4 bilateral with immedate capillary fill time. Pedal hair growth present. No varicosities and no lower extremity edema present bilateral.   Neruologic: Grossly intact via light touch bilateral. Vibratory intact via tuning fork bilateral. Protective threshold with Semmes Wienstein monofilament is diminished bilaterally to all people sites.  Patellar and Achilles deep tendon reflexes 2+ bilateral. No  Babinski or clonus noted bilateral.   Musculoskeletal: No gross boney pedal deformities bilateral. No pain, crepitus, or limitation noted with foot and ankle range of motion bilateral. Muscular strength 5/5 in all groups tested bilateral.  Gait: Unassisted, Nonantalgic.    Radiographs:  No acute findings.   Assessment & Plan:   Assessment: Diabetic peripheral neuropathy.  Pain in limb secondary to onychomycosis.  Plan: Discussed etiology pathology conservative versus surgical therapies.  At this point I encouraged her to use her gabapentin 600 mg every night rather than just when her back hurts.  We rewrote this today and I will follow-up with her in 2-3 months.  Debrided toenails 1 through 5 bilateral.     Max T. Sullivan, [place]

## 2017-10-15 DIAGNOSIS — I739 Peripheral vascular disease, unspecified: Secondary | ICD-10-CM | POA: Diagnosis not present

## 2017-10-15 DIAGNOSIS — R0609 Other forms of dyspnea: Secondary | ICD-10-CM | POA: Diagnosis not present

## 2017-10-17 ENCOUNTER — Ambulatory Visit
Admission: RE | Admit: 2017-10-17 | Discharge: 2017-10-17 | Disposition: A | Discharge status: Home / Self Care | Payer: Medicare Other | Source: Ambulatory Visit | Attending: Cardiology | Admitting: Cardiology

## 2017-10-17 ENCOUNTER — Other Ambulatory Visit: Payer: Self-pay | Admitting: Cardiology

## 2017-10-17 DIAGNOSIS — C3490 Malignant neoplasm of unspecified part of unspecified bronchus or lung: Secondary | ICD-10-CM

## 2017-11-03 DIAGNOSIS — K31811 Angiodysplasia of stomach and duodenum with bleeding: Secondary | ICD-10-CM | POA: Diagnosis not present

## 2017-11-03 DIAGNOSIS — K219 Gastro-esophageal reflux disease without esophagitis: Secondary | ICD-10-CM | POA: Diagnosis not present

## 2017-11-03 DIAGNOSIS — R1310 Dysphagia, unspecified: Secondary | ICD-10-CM | POA: Diagnosis not present

## 2017-11-06 DIAGNOSIS — R1310 Dysphagia, unspecified: Secondary | ICD-10-CM | POA: Diagnosis not present

## 2017-11-06 DIAGNOSIS — K449 Diaphragmatic hernia without obstruction or gangrene: Secondary | ICD-10-CM | POA: Diagnosis not present

## 2017-11-18 ENCOUNTER — Other Ambulatory Visit: Payer: Self-pay | Admitting: Internal Medicine

## 2017-11-18 DIAGNOSIS — Z1231 Encounter for screening mammogram for malignant neoplasm of breast: Secondary | ICD-10-CM

## 2017-11-20 DIAGNOSIS — I10 Essential (primary) hypertension: Secondary | ICD-10-CM | POA: Diagnosis not present

## 2017-11-20 DIAGNOSIS — E785 Hyperlipidemia, unspecified: Secondary | ICD-10-CM | POA: Diagnosis not present

## 2017-11-20 DIAGNOSIS — E1165 Type 2 diabetes mellitus with hyperglycemia: Secondary | ICD-10-CM | POA: Diagnosis not present

## 2017-11-20 DIAGNOSIS — J449 Chronic obstructive pulmonary disease, unspecified: Secondary | ICD-10-CM | POA: Diagnosis not present

## 2017-11-24 IMAGING — CR DG CHEST 2V
2 series · 2 of 2 positions shown · non-contrast
Comparison: Chest CT 09/07/2014 radiographs 11/29/2013

CLINICAL DATA: Central chest pain onset tonight.

EXAM:
CHEST  2 VIEW

[chest pa]
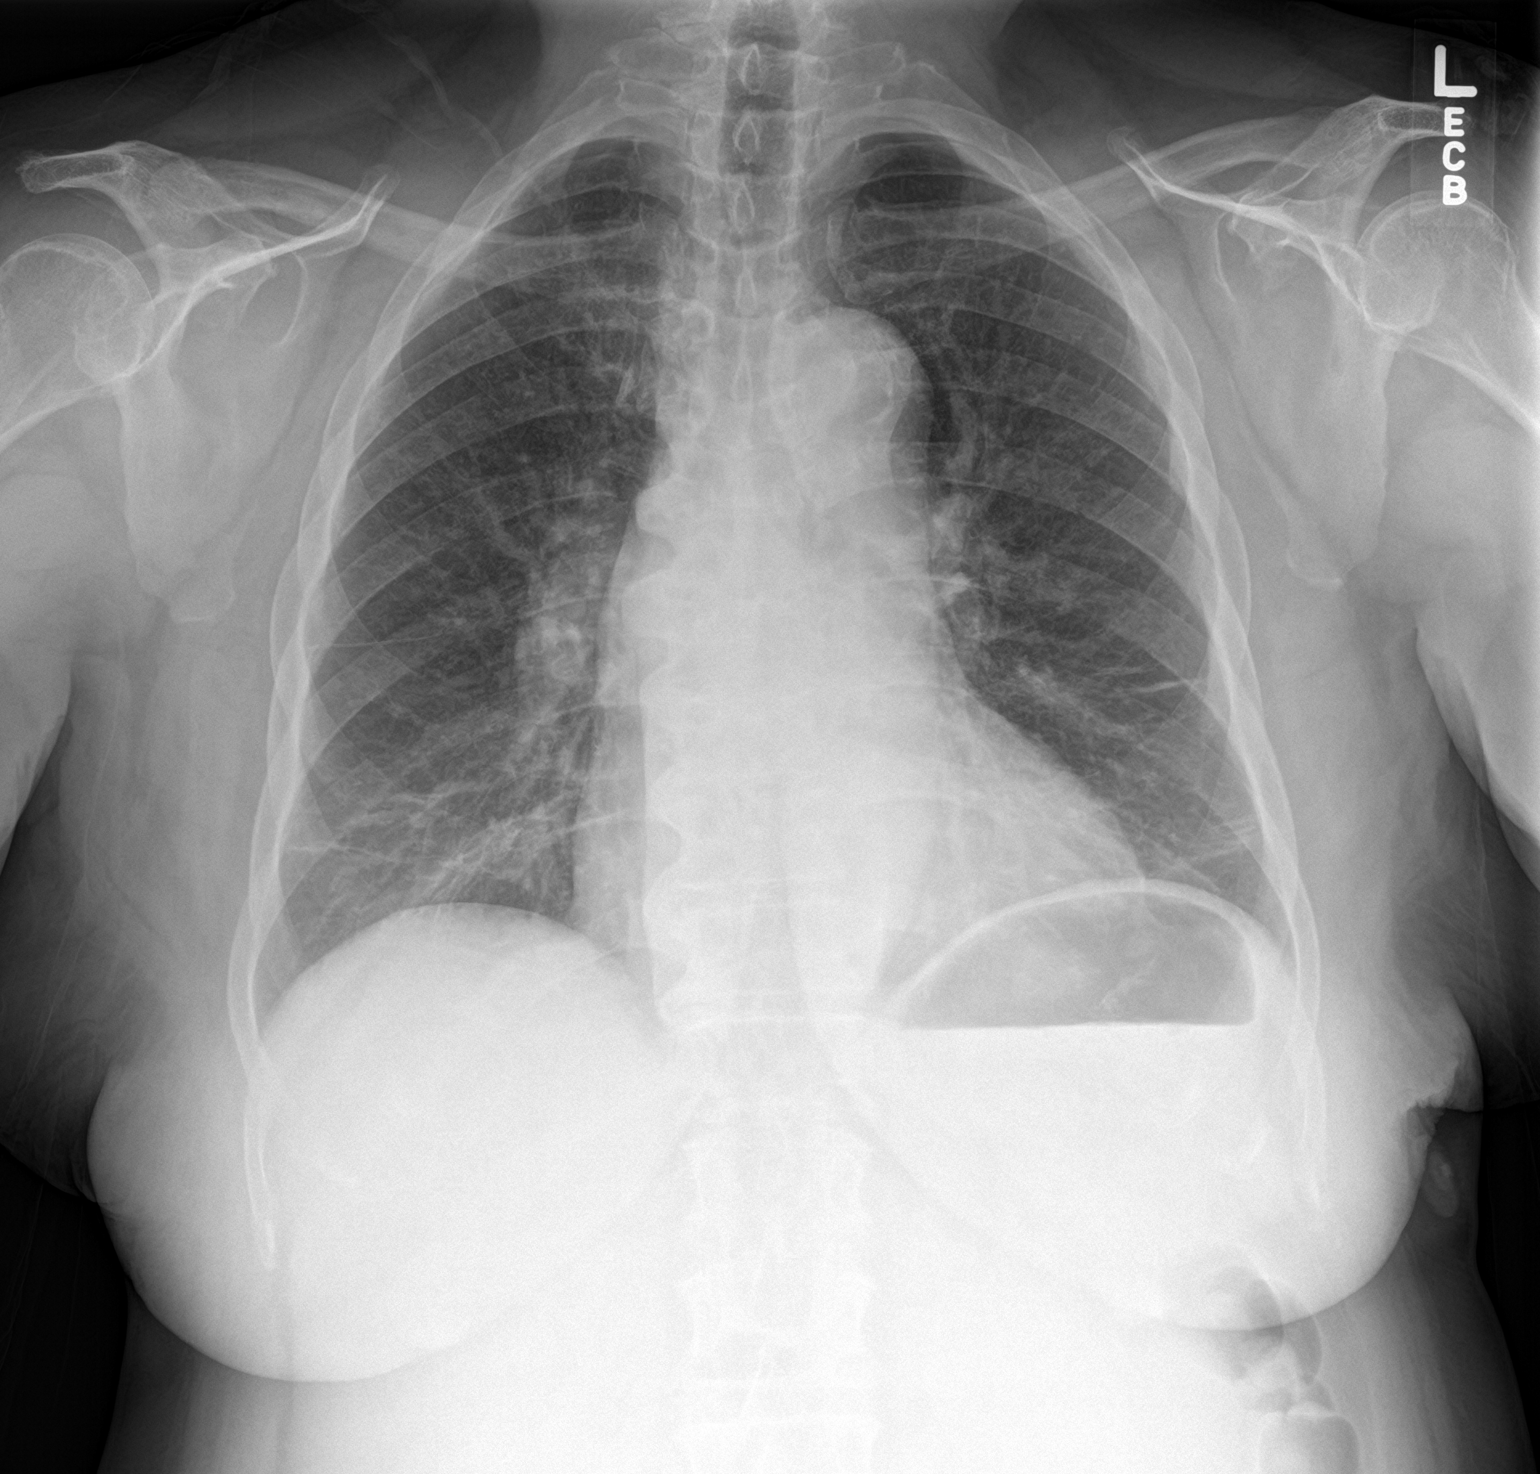

[chest lat]
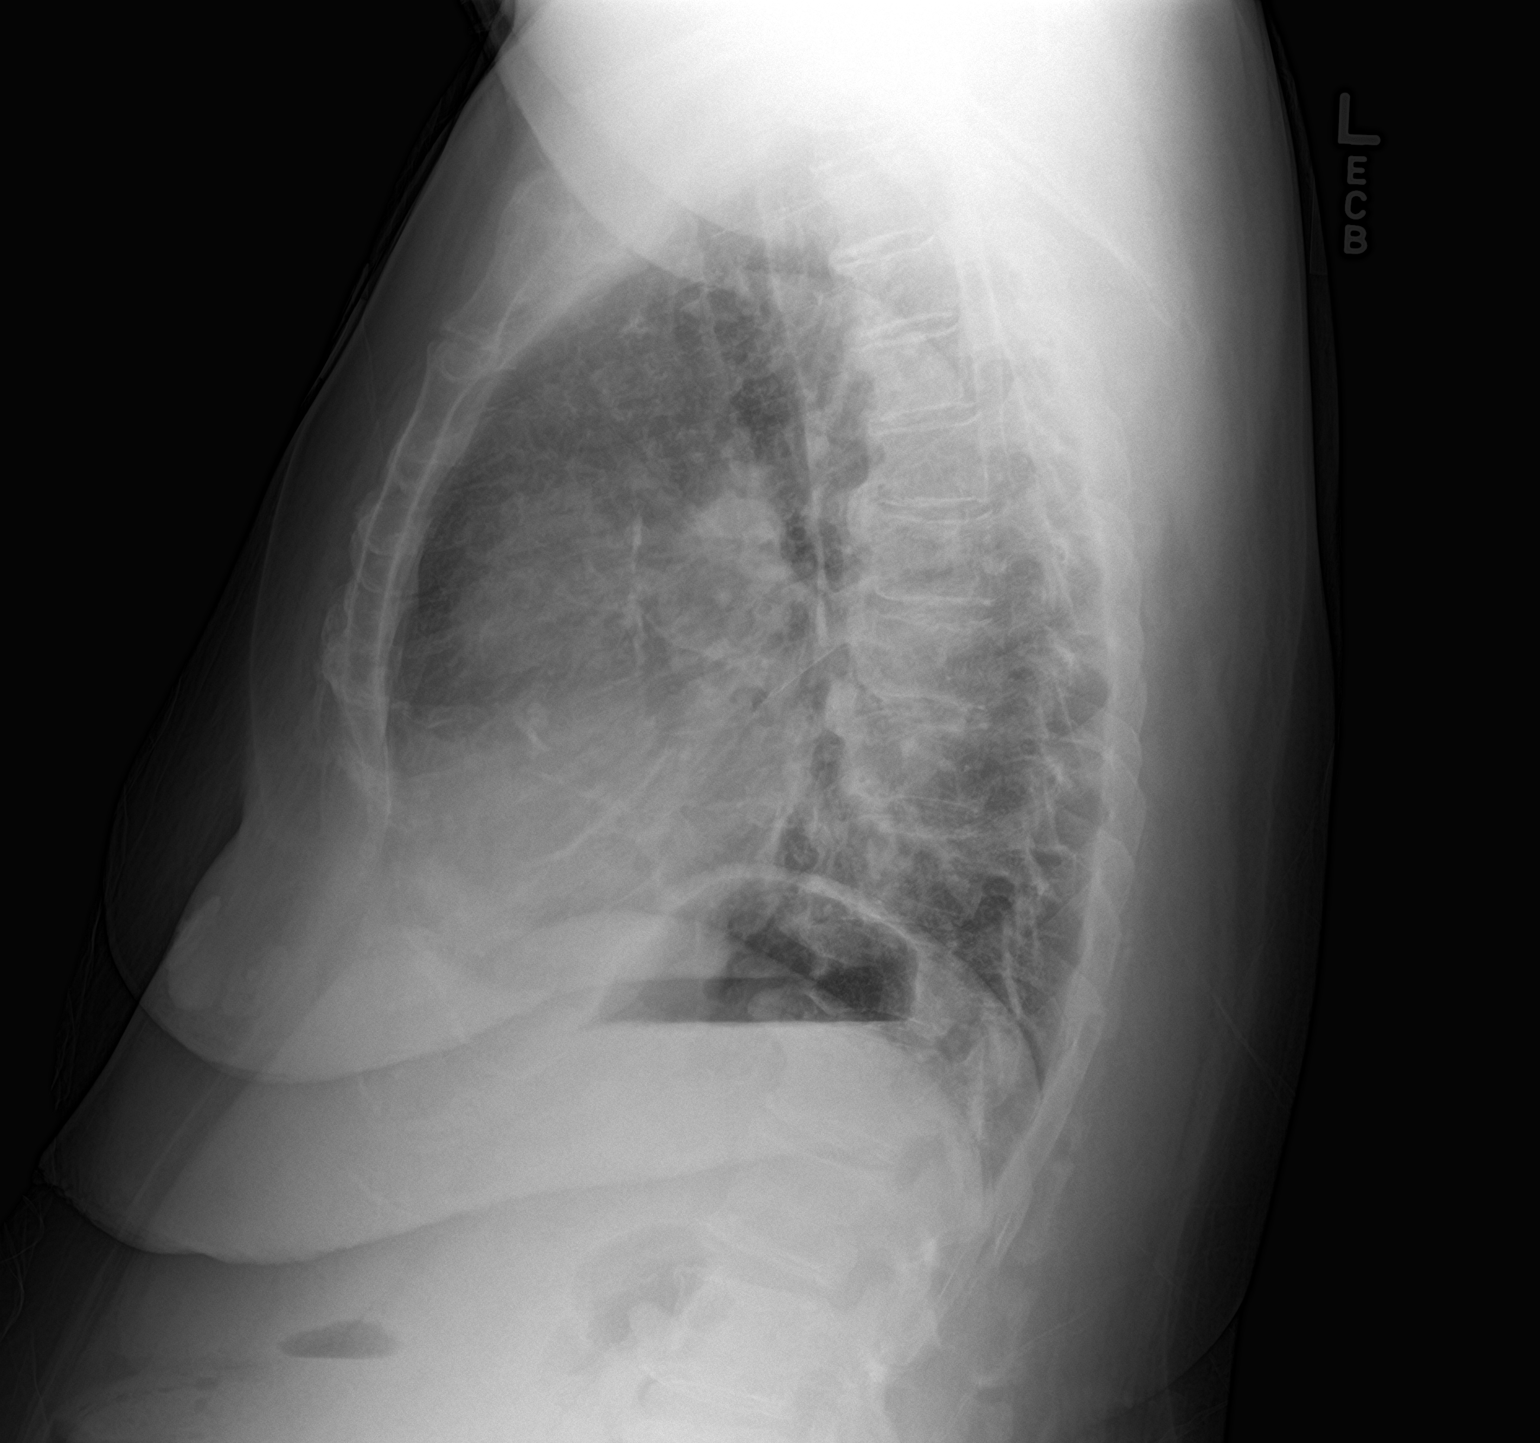

[2 of 2 positions shown; findings below may reference images not displayed]

FINDINGS: Mild cardiomegaly is unchanged. Mediastinal contours are unchanged,
there is atherosclerosis of the thoracic aorta. Linear scarring in
both lung bases, unchanged from prior CT. No pulmonary edema, focal
consolidation, pleural effusion or pneumothorax. Degenerative change
in the spine.
IMPRESSION: 1. Stable mild cardiomegaly. Stable linear scarring in the lung
bases.
2. Aortic atherosclerosis.

## 2017-12-08 DIAGNOSIS — R1310 Dysphagia, unspecified: Secondary | ICD-10-CM | POA: Diagnosis not present

## 2017-12-12 DIAGNOSIS — E785 Hyperlipidemia, unspecified: Secondary | ICD-10-CM | POA: Diagnosis not present

## 2017-12-12 DIAGNOSIS — E1165 Type 2 diabetes mellitus with hyperglycemia: Secondary | ICD-10-CM | POA: Diagnosis not present

## 2017-12-12 DIAGNOSIS — R609 Edema, unspecified: Secondary | ICD-10-CM | POA: Diagnosis not present

## 2017-12-15 ENCOUNTER — Ambulatory Visit
Admission: RE | Admit: 2017-12-15 | Discharge: 2017-12-15 | Disposition: A | Discharge status: Home / Self Care | Payer: Medicare Other | Source: Ambulatory Visit | Attending: Internal Medicine | Admitting: Internal Medicine

## 2017-12-15 DIAGNOSIS — Z1231 Encounter for screening mammogram for malignant neoplasm of breast: Secondary | ICD-10-CM

## 2017-12-17 DIAGNOSIS — H6983 Other specified disorders of Eustachian tube, bilateral: Secondary | ICD-10-CM | POA: Diagnosis not present

## 2018-01-06 ENCOUNTER — Encounter: Payer: Self-pay | Admitting: Podiatry

## 2018-01-06 ENCOUNTER — Ambulatory Visit (INDEPENDENT_AMBULATORY_CARE_PROVIDER_SITE_OTHER): Payer: Medicaid Other | Admitting: Podiatry

## 2018-01-06 DIAGNOSIS — D689 Coagulation defect, unspecified: Secondary | ICD-10-CM

## 2018-01-06 DIAGNOSIS — M79676 Pain in unspecified toe(s): Secondary | ICD-10-CM

## 2018-01-06 DIAGNOSIS — E1142 Type 2 diabetes mellitus with diabetic polyneuropathy: Secondary | ICD-10-CM

## 2018-01-06 DIAGNOSIS — B351 Tinea unguium: Secondary | ICD-10-CM

## 2018-01-07 NOTE — Progress Notes (Signed)
Presents today chief complaint of painful elongated toenails.  Objective: Vital signs stable alert and oriented x3.  Pulses are palpable neurologic sensorium is diminished per Semmes Weinstein monofilament toenails are long thick yellow dystrophic-like mycotic.  Assessment: Pain in limb secondary to onychomycosis diabetic peripheral neuropathy.  Plan: Debridement of toenails 1 through 5 bilateral.

## 2018-01-23 ENCOUNTER — Other Ambulatory Visit: Payer: Self-pay

## 2018-01-23 DIAGNOSIS — R600 Localized edema: Secondary | ICD-10-CM | POA: Diagnosis not present

## 2018-01-23 DIAGNOSIS — I10 Essential (primary) hypertension: Secondary | ICD-10-CM | POA: Diagnosis not present

## 2018-01-23 NOTE — Patient Outreach (Signed)
[facility]) Care Management  01/23/2018  [name] 1950/06/28 [id number]   Medication Adherence call to [name] left a message for patient to call back patient is due on Losartan 50 mg.[name] is showing past due under [place].  [facility] Management Direct Dial [phone]  Fax [phone] [name]

## 2018-01-28 DIAGNOSIS — R609 Edema, unspecified: Secondary | ICD-10-CM | POA: Diagnosis not present

## 2018-01-28 DIAGNOSIS — I10 Essential (primary) hypertension: Secondary | ICD-10-CM | POA: Diagnosis not present

## 2018-01-28 DIAGNOSIS — E785 Hyperlipidemia, unspecified: Secondary | ICD-10-CM | POA: Diagnosis not present

## 2018-01-28 DIAGNOSIS — E1140 Type 2 diabetes mellitus with diabetic neuropathy, unspecified: Secondary | ICD-10-CM | POA: Diagnosis not present

## 2018-01-28 DIAGNOSIS — J449 Chronic obstructive pulmonary disease, unspecified: Secondary | ICD-10-CM | POA: Diagnosis not present

## 2018-02-02 DIAGNOSIS — I10 Essential (primary) hypertension: Secondary | ICD-10-CM | POA: Diagnosis not present

## 2018-02-10 ENCOUNTER — Telehealth: Payer: Self-pay | Admitting: Internal Medicine

## 2018-02-10 NOTE — Telephone Encounter (Signed)
New referral received from Dr. Vista Lawman for IDA. Pt has been scheduled to see Dr. Julien Nordmann on 8/22 at 1130am. [name] hgb is 6.6. She is aware to arrive 30 minutes early.

## 2018-02-12 ENCOUNTER — Telehealth: Payer: Self-pay | Admitting: Internal Medicine

## 2018-02-12 ENCOUNTER — Encounter: Payer: Self-pay | Admitting: Internal Medicine

## 2018-02-12 ENCOUNTER — Other Ambulatory Visit: Payer: Self-pay | Admitting: Medical Oncology

## 2018-02-12 ENCOUNTER — Inpatient Hospital Stay: Payer: Medicare Other | Attending: Internal Medicine | Admitting: Internal Medicine

## 2018-02-12 ENCOUNTER — Inpatient Hospital Stay: Payer: Medicare Other

## 2018-02-12 ENCOUNTER — Telehealth: Payer: Self-pay | Admitting: Medical Oncology

## 2018-02-12 VITALS — BP 162/80 | HR 81 | Temp 98.7°F | Resp 18 | Ht 60.0 in | Wt 217.6 lb

## 2018-02-12 DIAGNOSIS — D509 Iron deficiency anemia, unspecified: Secondary | ICD-10-CM | POA: Insufficient documentation

## 2018-02-12 DIAGNOSIS — K219 Gastro-esophageal reflux disease without esophagitis: Secondary | ICD-10-CM

## 2018-02-12 DIAGNOSIS — I10 Essential (primary) hypertension: Secondary | ICD-10-CM | POA: Diagnosis not present

## 2018-02-12 DIAGNOSIS — E785 Hyperlipidemia, unspecified: Secondary | ICD-10-CM | POA: Diagnosis not present

## 2018-02-12 DIAGNOSIS — M129 Arthropathy, unspecified: Secondary | ICD-10-CM | POA: Diagnosis not present

## 2018-02-12 DIAGNOSIS — Z7982 Long term (current) use of aspirin: Secondary | ICD-10-CM | POA: Insufficient documentation

## 2018-02-12 DIAGNOSIS — Z86718 Personal history of other venous thrombosis and embolism: Secondary | ICD-10-CM | POA: Insufficient documentation

## 2018-02-12 DIAGNOSIS — F419 Anxiety disorder, unspecified: Secondary | ICD-10-CM | POA: Insufficient documentation

## 2018-02-12 DIAGNOSIS — J449 Chronic obstructive pulmonary disease, unspecified: Secondary | ICD-10-CM | POA: Diagnosis not present

## 2018-02-12 DIAGNOSIS — G8929 Other chronic pain: Secondary | ICD-10-CM | POA: Insufficient documentation

## 2018-02-12 DIAGNOSIS — F17210 Nicotine dependence, cigarettes, uncomplicated: Secondary | ICD-10-CM | POA: Diagnosis not present

## 2018-02-12 DIAGNOSIS — E119 Type 2 diabetes mellitus without complications: Secondary | ICD-10-CM | POA: Insufficient documentation

## 2018-02-12 DIAGNOSIS — G4730 Sleep apnea, unspecified: Secondary | ICD-10-CM | POA: Diagnosis not present

## 2018-02-12 DIAGNOSIS — Z79899 Other long term (current) drug therapy: Secondary | ICD-10-CM | POA: Diagnosis not present

## 2018-02-12 DIAGNOSIS — D649 Anemia, unspecified: Secondary | ICD-10-CM

## 2018-02-12 DIAGNOSIS — D539 Nutritional anemia, unspecified: Secondary | ICD-10-CM

## 2018-02-12 LAB — VITAMIN B12: Vitamin B-12: 269 pg/mL (ref 180–914)

## 2018-02-12 LAB — CBC WITH DIFFERENTIAL (CANCER CENTER ONLY)
BASOS ABS: 0.1 10*3/uL (ref 0.0–0.1)
Basophils Relative: 1 %
EOS PCT: 3 %
Eosinophils Absolute: 0.2 10*3/uL (ref 0.0–0.5)
HCT: 24.8 % — ABNORMAL LOW (ref 34.8–46.6)
Hemoglobin: 6.6 g/dL — CL (ref 11.6–15.9)
LYMPHS ABS: 2.1 10*3/uL (ref 0.9–3.3)
LYMPHS PCT: 28 %
MCH: 17.9 pg — AB (ref 25.1–34.0)
MCHC: 26.6 g/dL — ABNORMAL LOW (ref 31.5–36.0)
MCV: 67.4 fL — AB (ref 79.5–101.0)
Monocytes Absolute: 0.9 10*3/uL (ref 0.1–0.9)
Monocytes Relative: 12 %
NEUTROS ABS: 4.3 10*3/uL (ref 1.5–6.5)
Neutrophils Relative %: 56 %
PLATELETS: 525 10*3/uL — AB (ref 145–400)
RBC: 3.68 MIL/uL — AB (ref 3.70–5.45)
RDW: 18.9 % — ABNORMAL HIGH (ref 11.2–14.5)
WBC Count: 7.5 10*3/uL (ref 3.9–10.3)

## 2018-02-12 LAB — IRON AND TIBC
IRON: 8 ug/dL — AB (ref 41–142)
Saturation Ratios: 2 % — ABNORMAL LOW (ref 21–57)
TIBC: 476 ug/dL — AB (ref 236–444)
UIBC: 468 ug/dL

## 2018-02-12 LAB — FOLATE: Folate: 9.4 ng/mL (ref >5.9)

## 2018-02-12 LAB — PREPARE RBC (CROSSMATCH)

## 2018-02-12 LAB — ABO/RH: ABO/RH(D): O POS

## 2018-02-12 LAB — FERRITIN: Ferritin: 9 ng/mL — ABNORMAL LOW (ref 11–307)

## 2018-02-12 NOTE — Addendum Note (Signed)
Addended by: Ardeen Garland on: 02/12/2018 02:56 PM   Modules accepted: Orders

## 2018-02-12 NOTE — Telephone Encounter (Signed)
Pt notified of low hgb and need for blood transfusion

## 2018-02-12 NOTE — Telephone Encounter (Signed)
Scheduled appt per 8/22 los - gave patient AVS and calender per los.

## 2018-02-12 NOTE — Progress Notes (Signed)
Pt notified to come for type and cross today and do not remove blue armband -must have it on day of transfusion. Blood to be scheduled for either Friday or sat.

## 2018-02-12 NOTE — Progress Notes (Signed)
[place] [phone]   [phone]  CONSULT NOTE  REFERRING PHYSICIAN: Dr. Iona Beard Osei-Bonsu  REASON FOR CONSULTATION:  67 years old African-American female with severe anemia.  HPI [name] is a 67 y.o. female with past medical history significant for COPD, diabetes mellitus, hypertension, dyslipidemia, obstructive sleep apnea, deep venous thrombosis of the right lower extremity, osteoarthritis as well as history of anemia.  The patient was seen recently by her primary care physician for evaluation and CBC on 01/28/2018 showed hemoglobin was down to 6.6.  The patient was referred to me today for evaluation and recommendation regarding her condition.  She is not on any anticoagulation but she was taken aspirin for poor circulation.  She was seen by gastroenterology Dr. Almyra Free and she had a colonoscopy performed less than a year ago that according to the patient was unremarkable.  She denied having any nose or gum bleed.  She denied having any rectal bleeding.  Her only complaints today was fatigue.  She has no dizzy spells.  She denied having any nausea, vomiting, diarrhea or constipation.  She has no chest pain, shortness breath, cough or hemoptysis. Family history significant for mother with Alzheimer and father died in an accident. The patient is divorced and has 3 children.  She was accompanied today by her daughter [name].  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  She has a history for smoking less than 1 pack/day for around 50 years and unfortunately she continues to smoke.  She drinks alcohol occasionally and no history of drug abuse.  HPI  Past Medical History:  Diagnosis Date  . Anemia 12/26/2015  . Anxiety   . Arthritis   . Chronic pain   . COPD (chronic obstructive pulmonary disease) ([place])   . Deep vein blood clot of right lower extremity ([place]) 12/29/2012   Dr. Nadyne Coombes, stent in place  . Depression   . Diabetes  mellitus without complication ([place])   . GERD (gastroesophageal reflux disease)   . Heart murmur   . Hyperlipemia   . Hypertension   . Sleep apnea    uses a cpap  . Wears glasses     Past Surgical History:  Procedure Laterality Date  . ABDOMINAL HYSTERECTOMY    . CARDIAC CATHETERIZATION  4/15  . CESAREAN SECTION    . COLONOSCOPY N/A 01/08/2013   Procedure: COLONOSCOPY;  Surgeon: Beryle Beams, MD;  Location: [facility] ENDOSCOPY;  Service: Endoscopy;  Laterality: N/A;  . COLONOSCOPY N/A 12/28/2015   Procedure: COLONOSCOPY;  Surgeon: Carol Ada, MD;  Location: [facility] ENDOSCOPY;  Service: Endoscopy;  Laterality: N/A;  . ENTEROSCOPY N/A 12/28/2015   Procedure: ENTEROSCOPY;  Surgeon: Carol Ada, MD;  Location: [place];  Service: Endoscopy;  Laterality: N/A;  . ESOPHAGOGASTRODUODENOSCOPY N/A 01/08/2013   Procedure: ESOPHAGOGASTRODUODENOSCOPY (EGD);  Surgeon: Beryle Beams, MD;  Location: [facility] ENDOSCOPY;  Service: Endoscopy;  Laterality: N/A;  . HAND SURGERY    . HOT HEMOSTASIS N/A 12/28/2015   Procedure: HOT HEMOSTASIS (ARGON PLASMA COAGULATION/BICAP);  Surgeon: Carol Ada, MD;  Location: [facility] ENDOSCOPY;  Service: Endoscopy;  Laterality: N/A;  . LEFT HEART CATHETERIZATION WITH CORONARY ANGIOGRAM N/A 03/09/2013   Procedure: LEFT HEART CATHETERIZATION WITH CORONARY ANGIOGRAM;  Surgeon: Laverda Page, MD;  Location: [facility] CATH LAB;  Service: Cardiovascular;  Laterality: N/A;  . LOWER EXTREMITY ANGIOGRAM N/A 12/29/2012   Procedure: LOWER EXTREMITY ANGIOGRAM;  Surgeon: Laverda Page, MD;  Location: [place] CATH LAB;  Service: Cardiovascular;  Laterality: N/A;  . LOWER EXTREMITY ANGIOGRAM N/A 10/05/2013   Procedure: LOWER EXTREMITY ANGIOGRAM;  Surgeon: Laverda Page, MD;  Location: [facility] CATH LAB;  Service: Cardiovascular;  Laterality: N/A;  . LOWER EXTREMITY ANGIOGRAPHY N/A 02/11/2017   Procedure: Lower Extremity Angiography;  Surgeon: Adrian Prows, MD;  Location: [place] CV LAB;  Service: Cardiovascular;   Laterality: N/A;  . PERIPHERAL VASCULAR BALLOON ANGIOPLASTY  02/11/2017   Procedure: PERIPHERAL VASCULAR BALLOON ANGIOPLASTY;  Surgeon: Adrian Prows, MD;  Location: [place] CV LAB;  Service: Cardiovascular;;  Right SFA  . PERIPHERAL VASCULAR INTERVENTION Right 02/11/2017   Procedure: PERIPHERAL VASCULAR INTERVENTION;  Surgeon: Adrian Prows, MD;  Location: [place] CV LAB;  Service: Cardiovascular;  Laterality: Right;  Rt SFA  . stent in right leg  12/29/2012   for blood clot, Dr. Nadyne Coombes  . TYMPANOMASTOIDECTOMY Left 03/08/2014   Procedure: TYMPANOMASTOIDECTOMY LEFT;  Surgeon: Ascencion Dike, MD;  Location: [place];  Service: ENT;  Laterality: Left;  . TYMPANOMASTOIDECTOMY  2015  . UTERINE FIBROID SURGERY      Family History  Problem Relation Age of Onset  . Diabetes Mother   . Hypertension Mother   . Heart disease Mother   . Heart disease Father   . Hypertension Father     Social History Social History   Tobacco Use  . Smoking status: Current Every Day Smoker    Packs/day: 0.25    Years: 41.00    Pack years: 10.25    Types: Cigarettes  . Smokeless tobacco: Never Used  Substance Use Topics  . Alcohol use: No  . Drug use: Yes    Types: Marijuana    Comment: last-02/24/14    No Known Allergies  Current Outpatient Medications  Medication Sig Dispense Refill  . albuterol (PROVENTIL HFA;VENTOLIN HFA) 108 (90 Base) MCG/ACT inhaler Inhale 1-2 puffs into the lungs every 6 (six) hours as needed for wheezing or shortness of breath.    Marland Kitchen amLODipine (NORVASC) 10 MG tablet Take 10 mg by mouth daily.  0  . aspirin EC 81 MG tablet Take 81 mg by mouth daily.    Marland Kitchen atorvastatin (LIPITOR) 40 MG tablet Take 40 mg by mouth daily.    . Blood Glucose Monitoring Suppl (ACCU-CHEK NANO SMARTVIEW) w/Device KIT See admin instructions.  0  . clopidogrel (PLAVIX) 75 MG tablet Take 1 tablet (75 mg total) by mouth daily with breakfast.    . gabapentin (NEURONTIN) 600 MG tablet Take 1 tablet  (600 mg total) by mouth at bedtime. 30 tablet 11  . hydrALAZINE (APRESOLINE) 50 MG tablet Take 50 mg by mouth 3 (three) times daily.     . metFORMIN (GLUCOPHAGE) 500 MG tablet Take 0.5 tablets (250 mg total) by mouth 2 (two) times daily with a meal.    . olmesartan-hydrochlorothiazide (BENICAR HCT) 40-25 MG tablet     . valsartan-hydrochlorothiazide (DIOVAN-HCT) 320-25 MG tablet Take 1 tablet by mouth daily.    . nitroGLYCERIN (NITROSTAT) 0.4 MG SL tablet Place 0.4 mg under the tongue every 5 (five) minutes as needed for chest pain.     No current facility-administered medications for this visit.     Review of Systems  Constitutional: positive for fatigue Eyes: negative Ears, nose, mouth, throat, and face: negative Respiratory: negative Cardiovascular: negative Gastrointestinal: negative Genitourinary:negative Integument/breast: negative Hematologic/lymphatic: negative Musculoskeletal:negative Neurological: negative Behavioral/Psych: negative Endocrine: negative Allergic/Immunologic: negative  Physical Exam  VZC:HYIFO, healthy, no distress, well nourished and well developed SKIN:  skin color, texture, turgor are normal, no rashes or significant lesions HEAD: Normocephalic, No masses, lesions, tenderness or abnormalities EYES: normal, PERRLA, Conjunctiva are pink and non-injected EARS: External ears normal, Canals clear OROPHARYNX:no exudate and no erythema  NECK: supple, no adenopathy, no JVD LYMPH:  no palpable lymphadenopathy, no hepatosplenomegaly BREAST:not examined LUNGS: clear to auscultation , and palpation HEART: regular rate & rhythm, no murmurs and no gallops ABDOMEN:abdomen soft, non-tender, normal bowel sounds and no masses or organomegaly BACK: Back symmetric, no curvature., No CVA tenderness EXTREMITIES:no joint deformities, effusion, or inflammation, no edema  NEURO: alert & oriented x 3 with fluent speech, no focal motor/sensory deficits  PERFORMANCE  STATUS: ECOG 1  LABORATORY DATA: Lab Results  Component Value Date   WBC 7.5 02/12/2018   HGB 6.6 (LL) 02/12/2018   HCT 24.8 (L) 02/12/2018   MCV 67.4 (L) 02/12/2018   PLT 525 (H) 02/12/2018      Chemistry      Component Value Date/Time   NA 138 12/29/2015 0504   K 4.1 12/29/2015 0504   CL 109 12/29/2015 0504   CO2 24 12/29/2015 0504   BUN 11 12/29/2015 0504   CREATININE 0.75 12/29/2015 0504      Component Value Date/Time   CALCIUM 8.8 (L) 12/29/2015 0504   ALKPHOS 98 12/27/2015 0704   AST 14 (L) 12/27/2015 0704   ALT 9 (L) 12/27/2015 0704   BILITOT 0.7 12/27/2015 0704       RADIOGRAPHIC STUDIES: No results found.  ASSESSMENT: This is a very pleasant 67 years old African-American female presented with severe iron deficiency anemia of unclear etiology.  The patient mentioned that she had colonoscopy less than a year ago that was unremarkable.  She has been followed by Dr. Benson Norway from gastroenterology.   PLAN: I had a lengthy discussion with the patient today about her condition and treatment options.  I order several studies for evaluation of her anemia including repeat CBC, iron study, ferritin, serum folate, and B12, serum protein electrophoresis.  Hemoglobin electrophoresis as well as erythropoietin. Her CBC today showed persistent severe anemia with hemoglobin of 6.6. Iron study and ferritin showed severe iron deficiency. I recommended for the patient to receive 2 units of PRBCs transfusion. I will start the patient on Feraheme infusion 510 mg IV weekly for 2 doses.  I will also start her on oral iron tablets with Integra +1 capsule p.o. daily.  I also order stool for Hemoccult.  If positive, we will send the patient to see her gastroenterologist again for evaluation. The patient will come back for follow-up visit in 3 weeks for evaluation and repeat CBC. She was advised to call immediately if she has any concerning symptoms in the interval. The patient voices  understanding of current disease status and treatment options and is in agreement with the current care plan.  All questions were answered. The patient knows to call the clinic with any problems, questions or concerns. We can certainly see the patient much sooner if necessary.  Thank you so much for allowing me to participate in the care of Mrytle C Jankowiak. I will continue to follow up the patient with you and assist in her care.  I spent 40 minutes counseling the patient face to face. The total time spent in the appointment was 60 minutes.  Disclaimer: This note was dictated with voice recognition software. Similar sounding words can inadvertently be transcribed and may not be corrected upon review.   Eilleen Kempf February 12, 2018, 12:32 PM

## 2018-02-12 NOTE — Telephone Encounter (Signed)
Spoke to PT regarding upcoming aug appts per 8/22 sch message.

## 2018-02-13 LAB — PROTEIN ELECTROPHORESIS, SERUM, WITH REFLEX
A/G Ratio: 1 (ref 0.7–1.7)
Albumin ELP: 3.4 g/dL (ref 2.9–4.4)
Alpha-1-Globulin: 0.2 g/dL (ref 0.0–0.4)
Alpha-2-Globulin: 0.7 g/dL (ref 0.4–1.0)
Beta Globulin: 1.1 g/dL (ref 0.7–1.3)
GLOBULIN, TOTAL: 3.3 g/dL (ref 2.2–3.9)
Gamma Globulin: 1.2 g/dL (ref 0.4–1.8)
TOTAL PROTEIN ELP: 6.7 g/dL (ref 6.0–8.5)

## 2018-02-13 LAB — HEMOGLOBINOPATHY EVALUATION
HGB A2 QUANT: 1.3 % — AB (ref 1.8–3.2)
HGB F QUANT: 0 % (ref 0.0–2.0)
HGB S QUANTITAION: 0 %
Hgb A: 98.7 % (ref 96.4–98.8)
Hgb C: 0 %
Hgb Variant: 0 %

## 2018-02-14 ENCOUNTER — Inpatient Hospital Stay: Payer: Medicare Other | Attending: Internal Medicine

## 2018-02-14 DIAGNOSIS — D509 Iron deficiency anemia, unspecified: Secondary | ICD-10-CM | POA: Diagnosis not present

## 2018-02-14 DIAGNOSIS — I10 Essential (primary) hypertension: Secondary | ICD-10-CM | POA: Diagnosis not present

## 2018-02-14 DIAGNOSIS — Z86718 Personal history of other venous thrombosis and embolism: Secondary | ICD-10-CM | POA: Diagnosis not present

## 2018-02-14 DIAGNOSIS — D649 Anemia, unspecified: Secondary | ICD-10-CM

## 2018-02-14 DIAGNOSIS — Z79899 Other long term (current) drug therapy: Secondary | ICD-10-CM | POA: Diagnosis not present

## 2018-02-14 DIAGNOSIS — M129 Arthropathy, unspecified: Secondary | ICD-10-CM | POA: Diagnosis not present

## 2018-02-14 DIAGNOSIS — G4730 Sleep apnea, unspecified: Secondary | ICD-10-CM | POA: Diagnosis not present

## 2018-02-14 DIAGNOSIS — K219 Gastro-esophageal reflux disease without esophagitis: Secondary | ICD-10-CM | POA: Diagnosis not present

## 2018-02-14 DIAGNOSIS — Z7982 Long term (current) use of aspirin: Secondary | ICD-10-CM | POA: Diagnosis not present

## 2018-02-14 DIAGNOSIS — E785 Hyperlipidemia, unspecified: Secondary | ICD-10-CM | POA: Diagnosis not present

## 2018-02-14 DIAGNOSIS — G8929 Other chronic pain: Secondary | ICD-10-CM | POA: Diagnosis not present

## 2018-02-14 DIAGNOSIS — J449 Chronic obstructive pulmonary disease, unspecified: Secondary | ICD-10-CM | POA: Diagnosis not present

## 2018-02-14 DIAGNOSIS — E119 Type 2 diabetes mellitus without complications: Secondary | ICD-10-CM | POA: Diagnosis not present

## 2018-02-14 MED ORDER — SODIUM CHLORIDE 0.9% IV SOLUTION
250.0000 mL | Freq: Once | INTRAVENOUS | Status: AC
  Administered 2018-02-14: 250 mL via INTRAVENOUS
  Filled 2018-02-14: qty 250

## 2018-02-14 MED ORDER — ACETAMINOPHEN 325 MG PO TABS
650.0000 mg | ORAL_TABLET | Freq: Once | ORAL | Status: AC
  Administered 2018-02-14: 650 mg via ORAL

## 2018-02-14 MED ORDER — DIPHENHYDRAMINE HCL 25 MG PO CAPS
25.0000 mg | ORAL_CAPSULE | Freq: Once | ORAL | Status: AC
  Administered 2018-02-14: 25 mg via ORAL

## 2018-02-14 NOTE — Patient Instructions (Signed)

## 2018-02-15 LAB — TYPE AND SCREEN
ABO/RH(D): O POS
Antibody Screen: NEGATIVE
UNIT DIVISION: 0
Unit division: 0

## 2018-02-15 LAB — BPAM RBC
BLOOD PRODUCT EXPIRATION DATE: 201909222359
BLOOD PRODUCT EXPIRATION DATE: 201909222359
ISSUE DATE / TIME: 201908240930
ISSUE DATE / TIME: 201908241112
UNIT TYPE AND RH: 5100
Unit Type and Rh: 5100

## 2018-02-16 ENCOUNTER — Telehealth: Payer: Self-pay | Admitting: Internal Medicine

## 2018-02-16 NOTE — Telephone Encounter (Signed)
Scheduled appt per 8/24 sch message - pt is aware of appt date and time.

## 2018-02-17 ENCOUNTER — Other Ambulatory Visit: Payer: Self-pay | Admitting: Medical Oncology

## 2018-02-17 ENCOUNTER — Telehealth: Payer: Self-pay | Admitting: Medical Oncology

## 2018-02-17 ENCOUNTER — Other Ambulatory Visit: Payer: Self-pay

## 2018-02-17 DIAGNOSIS — I10 Essential (primary) hypertension: Secondary | ICD-10-CM | POA: Diagnosis not present

## 2018-02-17 DIAGNOSIS — D649 Anemia, unspecified: Secondary | ICD-10-CM

## 2018-02-17 DIAGNOSIS — G8929 Other chronic pain: Secondary | ICD-10-CM | POA: Diagnosis not present

## 2018-02-17 DIAGNOSIS — M129 Arthropathy, unspecified: Secondary | ICD-10-CM | POA: Diagnosis not present

## 2018-02-17 DIAGNOSIS — Z79899 Other long term (current) drug therapy: Secondary | ICD-10-CM | POA: Diagnosis not present

## 2018-02-17 DIAGNOSIS — D509 Iron deficiency anemia, unspecified: Secondary | ICD-10-CM | POA: Diagnosis not present

## 2018-02-17 DIAGNOSIS — E119 Type 2 diabetes mellitus without complications: Secondary | ICD-10-CM | POA: Diagnosis not present

## 2018-02-17 DIAGNOSIS — J449 Chronic obstructive pulmonary disease, unspecified: Secondary | ICD-10-CM | POA: Diagnosis not present

## 2018-02-17 DIAGNOSIS — E785 Hyperlipidemia, unspecified: Secondary | ICD-10-CM | POA: Diagnosis not present

## 2018-02-17 DIAGNOSIS — Z7982 Long term (current) use of aspirin: Secondary | ICD-10-CM | POA: Diagnosis not present

## 2018-02-17 DIAGNOSIS — G4730 Sleep apnea, unspecified: Secondary | ICD-10-CM | POA: Diagnosis not present

## 2018-02-17 DIAGNOSIS — Z86718 Personal history of other venous thrombosis and embolism: Secondary | ICD-10-CM | POA: Diagnosis not present

## 2018-02-17 DIAGNOSIS — K219 Gastro-esophageal reflux disease without esophagitis: Secondary | ICD-10-CM | POA: Diagnosis not present

## 2018-02-17 LAB — OCCULT BLOOD X 1 CARD TO LAB, STOOL
FECAL OCCULT BLD: POSITIVE — AB
Fecal Occult Bld: POSITIVE — AB
Fecal Occult Bld: POSITIVE — AB

## 2018-02-17 NOTE — Telephone Encounter (Signed)
Curt Bears, MD  Ardeen Garland, RN        She needs to call her GI doctor because of GI bleed   Pt notified. Appt with Dr Benson Norway tomorrow at 2 pm-LVM about appt.  Called to Dr hung and faxed lab results.

## 2018-02-18 ENCOUNTER — Other Ambulatory Visit: Payer: Self-pay | Admitting: Gastroenterology

## 2018-02-18 ENCOUNTER — Telehealth: Payer: Self-pay | Admitting: Medical Oncology

## 2018-02-18 DIAGNOSIS — D509 Iron deficiency anemia, unspecified: Secondary | ICD-10-CM | POA: Diagnosis not present

## 2018-02-18 DIAGNOSIS — R195 Other fecal abnormalities: Secondary | ICD-10-CM | POA: Diagnosis not present

## 2018-02-18 DIAGNOSIS — K921 Melena: Secondary | ICD-10-CM | POA: Diagnosis not present

## 2018-02-18 LAB — ERYTHROPOIETIN

## 2018-02-18 NOTE — Telephone Encounter (Signed)
Pt has enteroscopy 8/30 in am and iron in pm. I told pt to keep her appt  for iron .

## 2018-02-19 ENCOUNTER — Other Ambulatory Visit: Payer: Self-pay | Admitting: *Deleted

## 2018-02-20 ENCOUNTER — Ambulatory Visit (HOSPITAL_COMMUNITY): Payer: Medicare Other | Admitting: Anesthesiology

## 2018-02-20 ENCOUNTER — Telehealth: Payer: Self-pay | Admitting: *Deleted

## 2018-02-20 ENCOUNTER — Other Ambulatory Visit: Payer: Self-pay

## 2018-02-20 ENCOUNTER — Ambulatory Visit (HOSPITAL_COMMUNITY)
Admission: RE | Admit: 2018-02-20 | Discharge: 2018-02-20 | Disposition: A | Discharge status: Home / Self Care | Payer: Medicare Other | Source: Ambulatory Visit | Attending: Gastroenterology | Admitting: Gastroenterology

## 2018-02-20 ENCOUNTER — Encounter (HOSPITAL_COMMUNITY)
Admission: RE | Disposition: A | Discharge status: Home / Self Care | Payer: Self-pay | Source: Ambulatory Visit | Attending: Gastroenterology

## 2018-02-20 ENCOUNTER — Telehealth: Payer: Self-pay | Admitting: Internal Medicine

## 2018-02-20 ENCOUNTER — Encounter (HOSPITAL_COMMUNITY): Payer: Self-pay

## 2018-02-20 ENCOUNTER — Ambulatory Visit: Payer: Medicare Other

## 2018-02-20 DIAGNOSIS — F419 Anxiety disorder, unspecified: Secondary | ICD-10-CM | POA: Diagnosis not present

## 2018-02-20 DIAGNOSIS — I10 Essential (primary) hypertension: Secondary | ICD-10-CM | POA: Diagnosis not present

## 2018-02-20 DIAGNOSIS — Z8249 Family history of ischemic heart disease and other diseases of the circulatory system: Secondary | ICD-10-CM | POA: Insufficient documentation

## 2018-02-20 DIAGNOSIS — R011 Cardiac murmur, unspecified: Secondary | ICD-10-CM | POA: Diagnosis not present

## 2018-02-20 DIAGNOSIS — Z7984 Long term (current) use of oral hypoglycemic drugs: Secondary | ICD-10-CM | POA: Insufficient documentation

## 2018-02-20 DIAGNOSIS — K31811 Angiodysplasia of stomach and duodenum with bleeding: Secondary | ICD-10-CM | POA: Diagnosis not present

## 2018-02-20 DIAGNOSIS — Z6841 Body Mass Index (BMI) 40.0 and over, adult: Secondary | ICD-10-CM | POA: Insufficient documentation

## 2018-02-20 DIAGNOSIS — E119 Type 2 diabetes mellitus without complications: Secondary | ICD-10-CM | POA: Insufficient documentation

## 2018-02-20 DIAGNOSIS — K219 Gastro-esophageal reflux disease without esophagitis: Secondary | ICD-10-CM | POA: Diagnosis not present

## 2018-02-20 DIAGNOSIS — Z86718 Personal history of other venous thrombosis and embolism: Secondary | ICD-10-CM | POA: Insufficient documentation

## 2018-02-20 DIAGNOSIS — Z7982 Long term (current) use of aspirin: Secondary | ICD-10-CM | POA: Diagnosis not present

## 2018-02-20 DIAGNOSIS — F329 Major depressive disorder, single episode, unspecified: Secondary | ICD-10-CM | POA: Insufficient documentation

## 2018-02-20 DIAGNOSIS — F17210 Nicotine dependence, cigarettes, uncomplicated: Secondary | ICD-10-CM | POA: Insufficient documentation

## 2018-02-20 DIAGNOSIS — Z7902 Long term (current) use of antithrombotics/antiplatelets: Secondary | ICD-10-CM | POA: Insufficient documentation

## 2018-02-20 DIAGNOSIS — Q2733 Arteriovenous malformation of digestive system vessel: Secondary | ICD-10-CM | POA: Diagnosis not present

## 2018-02-20 DIAGNOSIS — Z9989 Dependence on other enabling machines and devices: Secondary | ICD-10-CM | POA: Diagnosis not present

## 2018-02-20 DIAGNOSIS — Z79899 Other long term (current) drug therapy: Secondary | ICD-10-CM | POA: Diagnosis not present

## 2018-02-20 DIAGNOSIS — E785 Hyperlipidemia, unspecified: Secondary | ICD-10-CM | POA: Insufficient documentation

## 2018-02-20 DIAGNOSIS — J449 Chronic obstructive pulmonary disease, unspecified: Secondary | ICD-10-CM | POA: Insufficient documentation

## 2018-02-20 DIAGNOSIS — G4730 Sleep apnea, unspecified: Secondary | ICD-10-CM | POA: Diagnosis not present

## 2018-02-20 DIAGNOSIS — D62 Acute posthemorrhagic anemia: Secondary | ICD-10-CM | POA: Insufficient documentation

## 2018-02-20 DIAGNOSIS — G4733 Obstructive sleep apnea (adult) (pediatric): Secondary | ICD-10-CM | POA: Diagnosis not present

## 2018-02-20 DIAGNOSIS — M1990 Unspecified osteoarthritis, unspecified site: Secondary | ICD-10-CM | POA: Diagnosis not present

## 2018-02-20 DIAGNOSIS — K921 Melena: Secondary | ICD-10-CM | POA: Diagnosis not present

## 2018-02-20 DIAGNOSIS — E1151 Type 2 diabetes mellitus with diabetic peripheral angiopathy without gangrene: Secondary | ICD-10-CM | POA: Diagnosis not present

## 2018-02-20 HISTORY — PX: HOT HEMOSTASIS: SHX5433

## 2018-02-20 HISTORY — PX: ENTEROSCOPY: SHX5533

## 2018-02-20 LAB — GLUCOSE, CAPILLARY: GLUCOSE-CAPILLARY: 86 mg/dL (ref 70–99)

## 2018-02-20 SURGERY — ENTEROSCOPY
Anesthesia: Monitor Anesthesia Care

## 2018-02-20 MED ORDER — PROPOFOL 10 MG/ML IV BOLUS
INTRAVENOUS | Status: DC | PRN
  Administered 2018-02-20: 10 mg via INTRAVENOUS
  Administered 2018-02-20 (×2): 20 mg via INTRAVENOUS
  Administered 2018-02-20: 10 mg via INTRAVENOUS
  Administered 2018-02-20: 20 mg via INTRAVENOUS
  Administered 2018-02-20: 10 mg via INTRAVENOUS
  Administered 2018-02-20: 20 mg via INTRAVENOUS
  Administered 2018-02-20: 10 mg via INTRAVENOUS
  Administered 2018-02-20: 20 mg via INTRAVENOUS
  Administered 2018-02-20 (×4): 10 mg via INTRAVENOUS
  Administered 2018-02-20: 20 mg via INTRAVENOUS
  Administered 2018-02-20 (×7): 10 mg via INTRAVENOUS

## 2018-02-20 MED ORDER — MIDAZOLAM HCL 2 MG/2ML IJ SOLN
INTRAMUSCULAR | Status: AC
  Filled 2018-02-20: qty 2

## 2018-02-20 MED ORDER — HYDRALAZINE HCL 20 MG/ML IJ SOLN
INTRAMUSCULAR | Status: DC | PRN
  Administered 2018-02-20 (×2): 5 mg via INTRAVENOUS

## 2018-02-20 MED ORDER — LACTATED RINGERS IV SOLN
INTRAVENOUS | Status: DC
  Administered 2018-02-20: 1000 mL via INTRAVENOUS

## 2018-02-20 MED ORDER — GLUCAGON HCL RDNA (DIAGNOSTIC) 1 MG IJ SOLR
INTRAMUSCULAR | Status: DC | PRN
  Administered 2018-02-20: .5 mg via INTRAVENOUS

## 2018-02-20 MED ORDER — ESMOLOL HCL 100 MG/10ML IV SOLN
INTRAVENOUS | Status: DC | PRN
  Administered 2018-02-20 (×2): 15 mg via INTRAVENOUS

## 2018-02-20 MED ORDER — MIDAZOLAM HCL 5 MG/5ML IJ SOLN
INTRAMUSCULAR | Status: DC | PRN
  Administered 2018-02-20: 1 mg via INTRAVENOUS

## 2018-02-20 MED ORDER — PROPOFOL 10 MG/ML IV BOLUS
INTRAVENOUS | Status: AC
  Filled 2018-02-20: qty 40

## 2018-02-20 NOTE — Telephone Encounter (Signed)
TC from pt's daughter stating that her mother is unable to come today for her iron transfusion. Pt had an EGD this am and is not feeling well afterwards - dizzy, nauseated and some diarrhea.  Daughter would like to re-schedule pt's iron infusion appts.  Notified Infusion room of cancellation and high priority scheduling message sent as well for appt changes.

## 2018-02-20 NOTE — Telephone Encounter (Signed)
R/s iron infusion per 8/30 sch message - pt is aware of appt date and time.

## 2018-02-20 NOTE — Discharge Instructions (Signed)

## 2018-02-20 NOTE — Transfer of Care (Signed)
Immediate Anesthesia Transfer of Care Note  Patient: JAMILLIA CLOSSON  Procedure(s) Performed: ENTEROSCOPY (N/A )  Patient Location: PACU and Endoscopy Unit  Anesthesia Type:MAC  Level of Consciousness: awake, alert  and oriented  Airway & Oxygen Therapy: Patient connected to nasal cannula oxygen  Post-op Assessment: Report given to RN and Post -op Vital signs reviewed and stable  Post vital signs: Reviewed and stable  Last Vitals:  Vitals Value Taken Time  BP 185/86 02/20/2018  8:26 AM  Temp 36.5 C 02/20/2018  8:26 AM  Pulse 75 02/20/2018  8:28 AM  Resp 24 02/20/2018  8:28 AM  SpO2 96 % 02/20/2018  8:28 AM  Vitals shown include unvalidated device data.  Last Pain:  Vitals:   02/20/18 0826  TempSrc: Oral  PainSc: 0-No pain         Complications: No apparent anesthesia complications

## 2018-02-20 NOTE — H&P (Signed)
[name] HPI: The patient was recently evaluated by her PCP and she was noted to be anemic. She subsequently saw Dr. Julien Nordmann and her HGB was found to be 6.6 g/dL, which is a drop from the 9 range. It was confirmed that she had blood in the stool with a hemoccult. For the past two weeks she noticed black stools. The patient was transfused 2 units of PRBC and she feels better as well as being provided with an iron infusion. An EGD was performed 10/2017 for complaints of dysphagia, but no strictures were identified. She was found to have a 3 cm hiatal hernia. Her colonoscopy in 2017 was significant for several adenomas and she was recommended a 3 year follow up. The patient does use ASA for prevention, but she is inconsistent with using the medication.   Past Medical History:  Diagnosis Date  . Anemia 12/26/2015  . Anxiety   . Arthritis   . Chronic pain   . COPD (chronic obstructive pulmonary disease) ([place])   . Deep vein blood clot of right lower extremity ([place]) 12/29/2012   Dr. Nadyne Coombes, stent in place  . Depression   . Diabetes mellitus without complication ()   . GERD (gastroesophageal reflux disease)   . Heart murmur   . Hyperlipemia   . Hypertension   . Sleep apnea    uses a cpap  . Wears glasses     Past Surgical History:  Procedure Laterality Date  . ABDOMINAL HYSTERECTOMY    . CARDIAC CATHETERIZATION  4/15  . CESAREAN SECTION    . COLONOSCOPY N/A 01/08/2013   Procedure: COLONOSCOPY;  Surgeon: Beryle Beams, MD;  Location: [facility] ENDOSCOPY;  Service: Endoscopy;  Laterality: N/A;  . COLONOSCOPY N/A 12/28/2015   Procedure: COLONOSCOPY;  Surgeon: Carol Ada, MD;  Location: [facility] ENDOSCOPY;  Service: Endoscopy;  Laterality: N/A;  . ENTEROSCOPY N/A 12/28/2015   Procedure: ENTEROSCOPY;  Surgeon: Carol Ada, MD;  Location: [place];  Service: Endoscopy;  Laterality: N/A;  . ESOPHAGOGASTRODUODENOSCOPY N/A 01/08/2013   Procedure: ESOPHAGOGASTRODUODENOSCOPY (EGD);  Surgeon:  Beryle Beams, MD;  Location: [facility];  Service: Endoscopy;  Laterality: N/A;  . HAND SURGERY    . HOT HEMOSTASIS N/A 12/28/2015   Procedure: HOT HEMOSTASIS (ARGON PLASMA COAGULATION/BICAP);  Surgeon: Carol Ada, MD;  Location: [facility] ENDOSCOPY;  Service: Endoscopy;  Laterality: N/A;  . LEFT HEART CATHETERIZATION WITH CORONARY ANGIOGRAM N/A 03/09/2013   Procedure: LEFT HEART CATHETERIZATION WITH CORONARY ANGIOGRAM;  Surgeon: Laverda Page, MD;  Location: [facility] CATH LAB;  Service: Cardiovascular;  Laterality: N/A;  . LOWER EXTREMITY ANGIOGRAM N/A 12/29/2012   Procedure: LOWER EXTREMITY ANGIOGRAM;  Surgeon: Laverda Page, MD;  Location: [facility] CATH LAB;  Service: Cardiovascular;  Laterality: N/A;  . LOWER EXTREMITY ANGIOGRAM N/A 10/05/2013   Procedure: LOWER EXTREMITY ANGIOGRAM;  Surgeon: Laverda Page, MD;  Location: [facility] CATH LAB;  Service: Cardiovascular;  Laterality: N/A;  . LOWER EXTREMITY ANGIOGRAPHY N/A 02/11/2017   Procedure: Lower Extremity Angiography;  Surgeon: Adrian Prows, MD;  Location: [place] CV LAB;  Service: Cardiovascular;  Laterality: N/A;  . PERIPHERAL VASCULAR BALLOON ANGIOPLASTY  02/11/2017   Procedure: PERIPHERAL VASCULAR BALLOON ANGIOPLASTY;  Surgeon: Adrian Prows, MD;  Location: [place] CV LAB;  Service: Cardiovascular;;  Right SFA  . PERIPHERAL VASCULAR INTERVENTION Right 02/11/2017   Procedure: PERIPHERAL VASCULAR INTERVENTION;  Surgeon: Adrian Prows, MD;  Location: [place] CV LAB;  Service: Cardiovascular;  Laterality: Right;  Rt SFA  . stent in right  leg  12/29/2012   for blood clot, Dr. Nadyne Coombes  . TYMPANOMASTOIDECTOMY Left 03/08/2014   Procedure: TYMPANOMASTOIDECTOMY LEFT;  Surgeon: Ascencion Dike, MD;  Location: [place];  Service: ENT;  Laterality: Left;  . TYMPANOMASTOIDECTOMY  2015  . UTERINE FIBROID SURGERY      Family History  Problem Relation Age of Onset  . Diabetes Mother   . Hypertension Mother   . Heart disease Mother   . Heart disease  Father   . Hypertension Father     Social History:  reports that she has been smoking cigarettes. She has a 10.25 pack-year smoking history. She has never used smokeless tobacco. She reports that she has current or past drug history. Drug: Marijuana. She reports that she does not drink alcohol.  Allergies: No Known Allergies  Medications:  Scheduled:  Continuous: . lactated ringers 1,000 mL (02/20/18 0700)    Results for orders placed or performed during the hospital encounter of 02/20/18 (from the past 24 hour(s))  Glucose, capillary     Status: None   Collection Time: 02/20/18  6:57 AM  Result Value Ref Range   Glucose-Capillary 86 70 - 99 mg/dL     No results found.  ROS:  As stated above in the HPI otherwise negative.  Blood pressure (!) 208/95, pulse (!) 55, temperature 98.2 F (36.8 C), temperature source Oral, resp. rate 17, height 5' (1.524 m), weight 98.7 kg, SpO2 94 %.    PE: Gen: NAD, Alert and Oriented HEENT:  [place]/AT, EOMI Neck: Supple, no LAD Lungs: CTA Bilaterally CV: RRR without M/G/R ABM: Soft, NTND, +BS Ext: No C/C/E  Assessment/Plan: 1) Melena. 2) Anemia.   The patient reports some more melena since she was last seen in the office this week.  She failed to report that she was on Plavix, however, this will help to localize a bleeding site.  A repeat procedure may be necessary.  Plan: 1) Enteroscopy.  Velton Roselle D 02/20/2018, 7:26 AM

## 2018-02-20 NOTE — Anesthesia Postprocedure Evaluation (Signed)
Anesthesia Post Note  Patient: [name]  Procedure(s) Performed: ENTEROSCOPY (N/A )     Patient location during evaluation: Endoscopy Anesthesia Type: MAC Level of consciousness: awake Pain management: pain level controlled Vital Signs Assessment: post-procedure vital signs reviewed and stable Respiratory status: spontaneous breathing Cardiovascular status: stable Postop Assessment: no apparent nausea or vomiting Anesthetic complications: no    Last Vitals:  Vitals:   02/20/18 0642 02/20/18 0826  BP: (!) 208/95   Pulse: (!) 55 82  Resp: 17 (!) 28  Temp: 36.8 C 36.5 C  SpO2: 94% 95%    Last Pain:  Vitals:   02/20/18 0826  TempSrc: Oral  PainSc: 0-No pain   Pain Goal:                 Merdith Adan JR,JOHN Wilhelmena Zea

## 2018-02-20 NOTE — Anesthesia Preprocedure Evaluation (Signed)
Anesthesia Evaluation  Patient identified by MRN, date of birth, ID band Patient awake    Airway Mallampati: II       Dental no notable dental hx. (+) Teeth Intact   Pulmonary sleep apnea and Continuous Positive Airway Pressure Ventilation , Current Smoker,    Pulmonary exam normal breath sounds clear to auscultation       Cardiovascular hypertension, Pt. on medications Normal cardiovascular exam Rhythm:Regular Rate:Normal     Neuro/Psych    GI/Hepatic   Endo/Other  diabetes, Type 2, Oral Hypoglycemic AgentsMorbid obesity  Renal/GU      Musculoskeletal   Abdominal (+) + obese,   Peds  Hematology   Anesthesia Other Findings   Reproductive/Obstetrics                             Anesthesia Physical Anesthesia Plan  ASA: III  Anesthesia Plan: MAC   Post-op Pain Management:    Induction:   PONV Risk Score and Plan: Ondansetron  Airway Management Planned: Natural Airway, Nasal Cannula and Simple Face Mask  Additional Equipment:   Intra-op Plan:   Post-operative Plan:   Informed Consent: I have reviewed the patients History and Physical, chart, labs and discussed the procedure including the risks, benefits and alternatives for the proposed anesthesia with the patient or authorized representative who has indicated his/her understanding and acceptance.   Dental advisory given  Plan Discussed with: CRNA  Anesthesia Plan Comments:         Anesthesia Quick Evaluation

## 2018-02-20 NOTE — Op Note (Signed)
[facility] Patient Name: [name] Procedure Date: 02/20/2018 MRN: [id number] Attending MD: Carol Ada , MD Date of Birth: 1951/05/26 CSN: [id number] Age: 67 Admit Type: Inpatient Procedure:                Small bowel enteroscopy Indications:              Acute post hemorrhagic anemia, Melena Providers:                Carol Ada, MD, Burtis Junes, RN, Alan Mulder,                            Technician, Karis Juba, CRNA Referring MD:              Medicines:                Propofol per Anesthesia Complications:            No immediate complications. Estimated Blood Loss:     Estimated blood loss: none. Procedure:                Pre-Anesthesia Assessment:                           - Prior to the procedure, a History and Physical                            was performed, and patient medications and                            allergies were reviewed. The patient's tolerance of                            previous anesthesia was also reviewed. The risks                            and benefits of the procedure and the sedation                            options and risks were discussed with the patient.                            All questions were answered, and informed consent                            was obtained. Prior Anticoagulants: The patient has                            taken Plavix (clopidogrel), last dose was day of                            procedure. ASA Grade Assessment: III - A patient                            with severe systemic disease. After reviewing the  risks and benefits, the patient was deemed in                            satisfactory condition to undergo the procedure.                           - Sedation was administered by an anesthesia                            professional. Deep sedation was attained.                           After obtaining informed consent, the endoscope was                             passed under direct vision. Throughout the                            procedure, the patient's blood pressure, pulse, and                            oxygen saturations were monitored continuously. The                            [id number]-[id number] ([id number] Olympus peds colonscope was                            introduced through the mouth and advanced to the                            proximal jejunum. The small bowel enteroscopy was                            technically difficult and complex. The patient                            tolerated the procedure well. Scope In: Scope Out: Findings:      The esophagus was normal.      The stomach was normal.      Multiple angiodysplastic lesions with stigmata of recent bleeding were       found in the second portion of the duodenum and in the third portion of       the duodenum. Coagulation for hemostasis using monopolar probe was       successful. Estimated blood loss: none.      A few angiodysplastic lesions with no bleeding were found in the       proximal jejunum. Coagulation for tissue destruction using monopolar       probe was successful. Estimated blood loss: none.      The majority of the AVMs were noted in the second portion of the       duodenum at the ampulla and at the junction of D2 and D3. There was       fresh blood noted in this portion, but an overtly bleeding AVM was not       found. However, it is felt  that the AVM in the area of the bleeding was       successfully ablated. The APC did not precipitate any bleeding. Impression:               - Normal esophagus.                           - Normal stomach.                           - Multiple recently bleeding angiodysplastic                            lesions in the duodenum. Treated with a monopolar                            probe.                           - A few non-bleeding angiodysplastic lesions in the                            duodenum. Treated with a monopolar  probe.                           - No specimens collected. Recommendation:           - Patient has a contact number available for                            emergencies. The signs and symptoms of potential                            delayed complications were discussed with the                            patient. Return to normal activities tomorrow.                            Written discharge instructions were provided to the                            patient.                           - Resume regular diet.                           - Hold Plavix for 3 days and then restart.                           - Follow up in the office in 2 weeks. Procedure Code(s):        --- Professional ---                           [phone], Small intestinal endoscopy, enteroscopy  beyond second portion of duodenum, not including                            ileum; with ablation of tumor(s), polyp(s), or                            other lesion(s) not amenable to removal by hot                            biopsy forceps, bipolar cautery or snare technique                           [id number], 59, Small intestinal endoscopy, enteroscopy                            beyond second portion of duodenum, not including                            ileum; with control of bleeding (eg, injection,                            bipolar cautery, unipolar cautery, laser, heater                            probe, stapler, plasma coagulator) Diagnosis Code(s):        --- Professional ---                           X51.700, Angiodysplasia of stomach and duodenum                            with bleeding                           D62, Acute posthemorrhagic anemia                           K92.1, Melena (includes Hematochezia) CPT copyright 2017 American Medical Association. All rights reserved. The codes documented in this report are preliminary and upon coder review may  be revised to meet current compliance  requirements. Carol Ada, MD Carol Ada, MD 02/20/2018 8:29:02 AM This report has been signed electronically. Number of Addenda: 0

## 2018-02-24 ENCOUNTER — Encounter (HOSPITAL_COMMUNITY): Payer: Self-pay | Admitting: Gastroenterology

## 2018-02-24 ENCOUNTER — Telehealth: Payer: Self-pay | Admitting: Internal Medicine

## 2018-02-24 NOTE — Telephone Encounter (Signed)
LVm for pt regarding vm she left to r/s appts.

## 2018-02-27 ENCOUNTER — Inpatient Hospital Stay: Payer: Medicare Other | Attending: Internal Medicine

## 2018-02-27 VITALS — BP 170/89 | HR 68 | Temp 99.1°F | Resp 17

## 2018-02-27 DIAGNOSIS — D509 Iron deficiency anemia, unspecified: Secondary | ICD-10-CM | POA: Insufficient documentation

## 2018-02-27 DIAGNOSIS — Z79899 Other long term (current) drug therapy: Secondary | ICD-10-CM | POA: Diagnosis not present

## 2018-02-27 DIAGNOSIS — J449 Chronic obstructive pulmonary disease, unspecified: Secondary | ICD-10-CM | POA: Diagnosis not present

## 2018-02-27 DIAGNOSIS — F418 Other specified anxiety disorders: Secondary | ICD-10-CM | POA: Diagnosis not present

## 2018-02-27 DIAGNOSIS — G4730 Sleep apnea, unspecified: Secondary | ICD-10-CM | POA: Diagnosis not present

## 2018-02-27 DIAGNOSIS — R011 Cardiac murmur, unspecified: Secondary | ICD-10-CM | POA: Diagnosis not present

## 2018-02-27 DIAGNOSIS — K5521 Angiodysplasia of colon with hemorrhage: Secondary | ICD-10-CM | POA: Diagnosis not present

## 2018-02-27 DIAGNOSIS — Z86718 Personal history of other venous thrombosis and embolism: Secondary | ICD-10-CM | POA: Diagnosis not present

## 2018-02-27 DIAGNOSIS — K219 Gastro-esophageal reflux disease without esophagitis: Secondary | ICD-10-CM | POA: Insufficient documentation

## 2018-02-27 DIAGNOSIS — D649 Anemia, unspecified: Secondary | ICD-10-CM

## 2018-02-27 DIAGNOSIS — E119 Type 2 diabetes mellitus without complications: Secondary | ICD-10-CM | POA: Insufficient documentation

## 2018-02-27 DIAGNOSIS — Z7984 Long term (current) use of oral hypoglycemic drugs: Secondary | ICD-10-CM | POA: Insufficient documentation

## 2018-02-27 DIAGNOSIS — G8929 Other chronic pain: Secondary | ICD-10-CM | POA: Insufficient documentation

## 2018-02-27 DIAGNOSIS — I10 Essential (primary) hypertension: Secondary | ICD-10-CM | POA: Diagnosis not present

## 2018-02-27 DIAGNOSIS — E785 Hyperlipidemia, unspecified: Secondary | ICD-10-CM | POA: Insufficient documentation

## 2018-02-27 MED ORDER — SODIUM CHLORIDE 0.9 % IV SOLN
Freq: Once | INTRAVENOUS | Status: AC
  Administered 2018-02-27: 15:00:00 via INTRAVENOUS
  Filled 2018-02-27: qty 250

## 2018-02-27 MED ORDER — SODIUM CHLORIDE 0.9 % IV SOLN
510.0000 mg | Freq: Once | INTRAVENOUS | Status: AC
  Administered 2018-02-27: 510 mg via INTRAVENOUS
  Filled 2018-02-27: qty 17

## 2018-02-27 NOTE — Patient Instructions (Signed)

## 2018-03-04 DIAGNOSIS — D62 Acute posthemorrhagic anemia: Secondary | ICD-10-CM | POA: Diagnosis not present

## 2018-03-04 DIAGNOSIS — K921 Melena: Secondary | ICD-10-CM | POA: Diagnosis not present

## 2018-03-04 DIAGNOSIS — K31811 Angiodysplasia of stomach and duodenum with bleeding: Secondary | ICD-10-CM | POA: Diagnosis not present

## 2018-03-04 DIAGNOSIS — K219 Gastro-esophageal reflux disease without esophagitis: Secondary | ICD-10-CM | POA: Diagnosis not present

## 2018-03-05 ENCOUNTER — Other Ambulatory Visit: Payer: Self-pay

## 2018-03-05 ENCOUNTER — Emergency Department (HOSPITAL_COMMUNITY): Payer: Medicare Other

## 2018-03-05 ENCOUNTER — Emergency Department (HOSPITAL_COMMUNITY)
Admission: EM | Admit: 2018-03-05 | Discharge: 2018-03-05 | Disposition: A | Discharge status: Home / Self Care | Payer: Medicare Other | Attending: Emergency Medicine | Admitting: Emergency Medicine

## 2018-03-05 ENCOUNTER — Encounter (HOSPITAL_COMMUNITY): Payer: Self-pay

## 2018-03-05 DIAGNOSIS — R05 Cough: Secondary | ICD-10-CM | POA: Insufficient documentation

## 2018-03-05 DIAGNOSIS — J449 Chronic obstructive pulmonary disease, unspecified: Secondary | ICD-10-CM | POA: Diagnosis not present

## 2018-03-05 DIAGNOSIS — F17210 Nicotine dependence, cigarettes, uncomplicated: Secondary | ICD-10-CM | POA: Diagnosis not present

## 2018-03-05 DIAGNOSIS — Z7984 Long term (current) use of oral hypoglycemic drugs: Secondary | ICD-10-CM | POA: Insufficient documentation

## 2018-03-05 DIAGNOSIS — E119 Type 2 diabetes mellitus without complications: Secondary | ICD-10-CM | POA: Insufficient documentation

## 2018-03-05 DIAGNOSIS — I10 Essential (primary) hypertension: Secondary | ICD-10-CM | POA: Diagnosis not present

## 2018-03-05 DIAGNOSIS — Z7982 Long term (current) use of aspirin: Secondary | ICD-10-CM | POA: Insufficient documentation

## 2018-03-05 DIAGNOSIS — R059 Cough, unspecified: Secondary | ICD-10-CM

## 2018-03-05 DIAGNOSIS — Z79899 Other long term (current) drug therapy: Secondary | ICD-10-CM | POA: Insufficient documentation

## 2018-03-05 DIAGNOSIS — R1011 Right upper quadrant pain: Secondary | ICD-10-CM | POA: Diagnosis not present

## 2018-03-05 HISTORY — DX: Encounter for other specified aftercare: Z51.89

## 2018-03-05 LAB — COMPREHENSIVE METABOLIC PANEL
ALK PHOS: 64 U/L (ref 38–126)
ALT: 10 U/L (ref 0–44)
ANION GAP: 9 (ref 5–15)
AST: 20 U/L (ref 15–41)
Albumin: 3.1 g/dL — ABNORMAL LOW (ref 3.5–5.0)
BILIRUBIN TOTAL: 0.5 mg/dL (ref 0.3–1.2)
BUN: 9 mg/dL (ref 8–23)
CALCIUM: 9 mg/dL (ref 8.9–10.3)
CO2: 25 mmol/L (ref 22–32)
Chloride: 108 mmol/L (ref 98–111)
Creatinine, Ser: 0.9 mg/dL (ref 0.44–1.00)
GFR calc Af Amer: >60 mL/min (ref >60)
Glucose, Bld: 136 mg/dL — ABNORMAL HIGH (ref 70–99)
POTASSIUM: 3.4 mmol/L — AB (ref 3.5–5.1)
Sodium: 142 mmol/L (ref 135–145)
TOTAL PROTEIN: 7.2 g/dL (ref 6.5–8.1)

## 2018-03-05 LAB — CBC
HEMATOCRIT: 37.2 % (ref 36.0–46.0)
HEMOGLOBIN: 9.8 g/dL — AB (ref 12.0–15.0)
MCH: 20.4 pg — ABNORMAL LOW (ref 26.0–34.0)
MCHC: 26.3 g/dL — AB (ref 30.0–36.0)
MCV: 77.3 fL — AB (ref 78.0–100.0)
Platelets: 506 10*3/uL — ABNORMAL HIGH (ref 150–400)
RBC: 4.81 MIL/uL (ref 3.87–5.11)
RDW: 26.9 % — AB (ref 11.5–15.5)
WBC: 9.8 10*3/uL (ref 4.0–10.5)

## 2018-03-05 MED ORDER — DOXYCYCLINE HYCLATE 100 MG PO CAPS: 100.0000 mg | ORAL_CAPSULE | Freq: Two times a day (BID) | ORAL | 0 refills | Status: AC

## 2018-03-05 MED ORDER — BENZONATATE 100 MG PO CAPS: 100.0000 mg | ORAL_CAPSULE | Freq: Three times a day (TID) | ORAL | 0 refills | Status: DC

## 2018-03-05 MED ORDER — PREDNISONE 10 MG (21) PO TBPK: ORAL_TABLET | ORAL | 0 refills | Status: DC

## 2018-03-05 NOTE — ED Notes (Signed)
Pt alert and oriented in NAD. Pt verbalized understanding of d/c instructions.

## 2018-03-05 NOTE — ED Provider Notes (Signed)
[facility] EMERGENCY DEPARTMENT Provider Note   CSN: [id number] Arrival date & time: 03/05/18  3614     History   Chief Complaint Chief Complaint  Patient presents with  . Cough    HPI [name] is a 67 y.o. female.  HPI   [name] is a 67 y.o. female, with a history of COPD, DM, and HTN, presenting to the ED with productive cough with white sputum for the past 3 weeks.  Last night during a coughing fit, she began to have soreness in the right lower chest/right upper quadrant of the abdomen.  She states, "it feels like a pulled muscle." She has not tried any therapies for her complaints.  She states she has adequate inhalers at home.  Denies fever/chills, N/V/C/D, shortness of breath, or any other complaints.   Past Medical History:  Diagnosis Date  . Anemia 12/26/2015  . Anxiety   . Arthritis   . Blood transfusion without reported diagnosis   . Chronic pain   . COPD (chronic obstructive pulmonary disease) ([place])   . Deep vein blood clot of right lower extremity ([place]) 12/29/2012   Dr. Nadyne Coombes, stent in place  . Depression   . Diabetes mellitus without complication ([place])   . GERD (gastroesophageal reflux disease)   . Heart murmur   . Hyperlipemia   . Hypertension   . Sleep apnea    uses a cpap  . Wears glasses     Patient Active Problem List   Diagnosis Date Noted  . Symptomatic anemia 12/26/2015  . Hypokalemia 12/26/2015  . Type 2 diabetes mellitus with peripheral vascular disease ([place]) 12/26/2015  . Claudication in peripheral vascular disease ([place]) 10/05/2013  . Discomfort in chest 05/11/2012  . Nicotine dependence 05/11/2012  . Bradycardia 05/11/2012  . HTN (hypertension) 05/11/2012  . Back pain 05/11/2012    Past Surgical History:  Procedure Laterality Date  . ABDOMINAL HYSTERECTOMY    . CARDIAC CATHETERIZATION  4/15  . CESAREAN SECTION    . COLONOSCOPY N/A 01/08/2013   Procedure: COLONOSCOPY;  Surgeon: Beryle Beams,  MD;  Location: [facility] ENDOSCOPY;  Service: Endoscopy;  Laterality: N/A;  . COLONOSCOPY N/A 12/28/2015   Procedure: COLONOSCOPY;  Surgeon: Carol Ada, MD;  Location: [facility] ENDOSCOPY;  Service: Endoscopy;  Laterality: N/A;  . ENTEROSCOPY N/A 12/28/2015   Procedure: ENTEROSCOPY;  Surgeon: Carol Ada, MD;  Location: [facility];  Service: Endoscopy;  Laterality: N/A;  . ENTEROSCOPY N/A 02/20/2018   Procedure: ENTEROSCOPY;  Surgeon: Carol Ada, MD;  Location: [facility] ENDOSCOPY;  Service: Endoscopy;  Laterality: N/A;  . ESOPHAGOGASTRODUODENOSCOPY N/A 01/08/2013   Procedure: ESOPHAGOGASTRODUODENOSCOPY (EGD);  Surgeon: Beryle Beams, MD;  Location: [facility] ENDOSCOPY;  Service: Endoscopy;  Laterality: N/A;  . HAND SURGERY    . HOT HEMOSTASIS N/A 12/28/2015   Procedure: HOT HEMOSTASIS (ARGON PLASMA COAGULATION/BICAP);  Surgeon: Carol Ada, MD;  Location: [facility] ENDOSCOPY;  Service: Endoscopy;  Laterality: N/A;  . HOT HEMOSTASIS N/A 02/20/2018   Procedure: HOT HEMOSTASIS (ARGON PLASMA COAGULATION/BICAP);  Surgeon: Carol Ada, MD;  Location: [facility] ENDOSCOPY;  Service: Endoscopy;  Laterality: N/A;  . LEFT HEART CATHETERIZATION WITH CORONARY ANGIOGRAM N/A 03/09/2013   Procedure: LEFT HEART CATHETERIZATION WITH CORONARY ANGIOGRAM;  Surgeon: Laverda Page, MD;  Location[facility] CATH LAB;  Service: Cardiovascular;  Laterality: N/A;  . LOWER EXTREMITY ANGIOGRAM N/A 12/29/2012   Procedure: LOWER EXTREMITY ANGIOGRAM;  Surgeon: Laverda Page, MD;  Location: [facility] CATH LAB;  Service: Cardiovascular;  Laterality:  N/A;  . LOWER EXTREMITY ANGIOGRAM N/A 10/05/2013   Procedure: LOWER EXTREMITY ANGIOGRAM;  Surgeon: Laverda Page, MD;  Location: [facility] CATH LAB;  Service: Cardiovascular;  Laterality: N/A;  . LOWER EXTREMITY ANGIOGRAPHY N/A 02/11/2017   Procedure: Lower Extremity Angiography;  Surgeon: Adrian Prows, MD;  Location: [place] CV LAB;  Service: Cardiovascular;  Laterality: N/A;  . PERIPHERAL VASCULAR BALLOON ANGIOPLASTY  02/11/2017     Procedure: PERIPHERAL VASCULAR BALLOON ANGIOPLASTY;  Surgeon: Adrian Prows, MD;  Location: [place] CV LAB;  Service: Cardiovascular;;  Right SFA  . PERIPHERAL VASCULAR INTERVENTION Right 02/11/2017   Procedure: PERIPHERAL VASCULAR INTERVENTION;  Surgeon: Adrian Prows, MD;  Location: [place] CV LAB;  Service: Cardiovascular;  Laterality: Right;  Rt SFA  . stent in right leg  12/29/2012   for blood clot, Dr. Nadyne Coombes  . TYMPANOMASTOIDECTOMY Left 03/08/2014   Procedure: TYMPANOMASTOIDECTOMY LEFT;  Surgeon: Ascencion Dike, MD;  Location: [place];  Service: ENT;  Laterality: Left;  . TYMPANOMASTOIDECTOMY  2015  . UTERINE FIBROID SURGERY       OB History   None      Home Medications    Prior to Admission medications   Medication Sig Start Date End Date Taking? Authorizing Provider  albuterol (PROVENTIL HFA;VENTOLIN HFA) 108 (90 Base) MCG/ACT inhaler Inhale 1-2 puffs into the lungs every 6 (six) hours as needed for wheezing or shortness of breath.    [provider]  aspirin EC 81 MG tablet Take 81 mg by mouth 3 (three) times a week.     [provider]  atorvastatin (LIPITOR) 40 MG tablet Take 40 mg by mouth daily. 09/08/13   [provider]  benzonatate (TESSALON) 100 MG capsule Take 1 capsule (100 mg total) by mouth every 8 (eight) hours. 03/05/18   Kamica Florance C, PA-C  Blood Glucose Monitoring Suppl (ACCU-CHEK NANO SMARTVIEW) w/Device KIT See admin instructions. 08/19/17   [provider]  clopidogrel (PLAVIX) 75 MG tablet Take 1 tablet (75 mg total) by mouth daily with breakfast. 02/12/17   Adrian Prows, MD  doxycycline (VIBRAMYCIN) 100 MG capsule Take 1 capsule (100 mg total) by mouth 2 (two) times daily for 5 days. 03/05/18 03/10/18  Safiyyah Vasconez C, PA-C  gabapentin (NEURONTIN) 600 MG tablet Take 1 tablet (600 mg total) by mouth at bedtime. 10/02/17   Hyatt, Max T, DPM  hydrALAZINE (APRESOLINE) 50 MG tablet Take 50 mg by mouth 3 (three) times daily.      [provider]  losartan (COZAAR) 50 MG tablet Take 50 mg by mouth daily.    [provider]  metFORMIN (GLUCOPHAGE) 500 MG tablet Take 0.5 tablets (250 mg total) by mouth 2 (two) times daily with a meal. 02/13/17   Adrian Prows, MD  nitroGLYCERIN (NITROSTAT) 0.4 MG SL tablet Place 0.4 mg under the tongue every 5 (five) minutes as needed for chest pain.    [provider]  predniSONE (STERAPRED UNI-PAK 21 TAB) 10 MG (21) TBPK tablet Take 6 tabs ('60mg'$ ) day 1, 5 tabs ('50mg'$ ) day 2, 4 tabs ('40mg'$ ) day 3, 3 tabs ('30mg'$ ) day 4, 2 tabs ('20mg'$ ) day 5, and 1 tab ('10mg'$ ) day 6. 03/05/18   Alyha Marines C, PA-C  spironolactone (ALDACTONE) 25 MG tablet Take 25 mg by mouth daily.    [provider]    Family History Family History  Problem Relation Age of Onset  . Diabetes Mother   . Hypertension Mother   . Heart  disease Mother   . Heart disease Father   . Hypertension Father     Social History Social History   Tobacco Use  . Smoking status: Current Every Day Smoker    Packs/day: 0.25    Years: 41.00    Pack years: 10.25    Types: Cigarettes  . Smokeless tobacco: Never Used  Substance Use Topics  . Alcohol use: No  . Drug use: Yes    Types: Marijuana    Comment: last-02/24/14     Allergies   Patient has no known allergies.   Review of Systems Review of Systems  Constitutional: Negative for chills, diaphoresis and fever.  Respiratory: Positive for cough. Negative for shortness of breath.   Cardiovascular: Negative for chest pain.  Gastrointestinal: Positive for abdominal pain. Negative for diarrhea, nausea and vomiting.  Neurological: Negative for dizziness, weakness, light-headedness, numbness and headaches.  All other systems reviewed and are negative.    Physical Exam Updated Vital Signs BP (!) 157/92 (BP Location: Right Arm)   Pulse 63   Temp 97.8 F (36.6 C) (Oral)   Resp 18   Ht 5' (1.524 m)   Wt 94.3 kg   SpO2 96%   BMI 40.62 kg/m    Physical Exam  Constitutional: She appears well-developed and well-nourished. No distress.  HENT:  Head: Normocephalic and atraumatic.  Eyes: Conjunctivae are normal.  Neck: Neck supple.  Cardiovascular: Normal rate, regular rhythm, normal heart sounds and intact distal pulses.  Pulmonary/Chest: Effort normal. No respiratory distress. She has wheezes in the right lower field and the left lower field.  Some coughing during interview.  No increased work of breathing noted.  Abdominal: Soft. There is tenderness. There is no guarding.    Musculoskeletal: She exhibits no edema.  Lymphadenopathy:    She has no cervical adenopathy.  Neurological: She is alert.  Skin: Skin is warm and dry. She is not diaphoretic.  Psychiatric: She has a normal mood and affect. Her behavior is normal.  Nursing note and vitals reviewed.    ED Treatments / Results  Labs (all labs ordered are listed, but only abnormal results are displayed) Labs Reviewed  CBC - Abnormal; Notable for the following components:      Result Value   Hemoglobin 9.8 (*)    MCV 77.3 (*)    MCH 20.4 (*)    MCHC 26.3 (*)    RDW 26.9 (*)    Platelets 506 (*)    All other components within normal limits  COMPREHENSIVE METABOLIC PANEL - Abnormal; Notable for the following components:   Potassium 3.4 (*)    Glucose, Bld 136 (*)    Albumin 3.1 (*)    All other components within normal limits    EKG None  Radiology Dg Chest 2 View  Result Date: 03/05/2018 CLINICAL DATA:  Cough for 1 month. EXAM: CHEST - 2 VIEW COMPARISON:  10/17/2017 FINDINGS: Lingular scarring. Heart is mildly enlarged. Right lung clear. No effusions or acute infiltrates. No acute bony abnormality. IMPRESSION: Cardiomegaly.  Lingular scarring.  No active disease. Electronically Signed   By: Rolm Baptise M.D.   On: 03/05/2018 09:22    Procedures Procedures (including critical care time)  Medications Ordered in ED Medications - No data to  display   Initial Impression / Assessment and Plan / ED Course  I have reviewed the triage vital signs and the nursing notes.  Pertinent labs & imaging results that were available during my care of the patient were  reviewed by me and considered in my medical decision making (see chart for details).     Patient presents with a cough for about 3 weeks. Patient is nontoxic appearing, afebrile, not tachycardic, not tachypneic, not hypotensive, maintains adequate SPO2 on room air, and is in no apparent distress.  Pain is reproducible on palpation.  Liver labs show no abnormalities.  No acute abnormalities on chest x-ray.  Patient already has follow-up scheduled for tomorrow. The patient was given instructions for home care as well as return precautions. Patient voices understanding of these instructions, accepts the plan, and is comfortable with discharge.  Findings and plan of care discussed with Duffy Bruce, MD.   Final Clinical Impressions(s) / ED Diagnoses   Final diagnoses:  Cough    ED Discharge Orders         Ordered    doxycycline (VIBRAMYCIN) 100 MG capsule  2 times daily     03/05/18 1413    benzonatate (TESSALON) 100 MG capsule  Every 8 hours     03/05/18 1413    predniSONE (STERAPRED UNI-PAK 21 TAB) 10 MG (21) TBPK tablet     03/05/18 1413           Zimir Kittleson, Helane Gunther, PA-C 03/05/18 1749    Duffy Bruce, MD 03/07/18 [phone]

## 2018-03-05 NOTE — ED Triage Notes (Signed)
Pt endorses productive cough with white/yellow drainage x 1 month and "every time I drink something I piss myself" Pt also endorses coughing last night and "I think I pulled something in my stomach" VSS.

## 2018-03-05 NOTE — Discharge Instructions (Signed)
Please take all of your antibiotics until finished!   You may develop abdominal discomfort or diarrhea from the antibiotic.  You may help offset this with probiotics which you can buy or get in yogurt. Do not eat or take the probiotics until 2 hours after your antibiotic.   Hand washing: Wash your hands throughout the day, but especially before and after touching the face, using the restroom, sneezing, coughing, or touching surfaces that have been coughed or sneezed upon. Hydration: Symptoms will be intensified and complicated by dehydration. Dehydration can also extend the duration of symptoms. Drink plenty of fluids and get plenty of rest. You should be drinking at least half a liter of water an hour to stay hydrated. Electrolyte drinks (ex. Gatorade, Powerade, Pedialyte) are also encouraged. You should be drinking enough fluids to make your urine light yellow, almost clear. If this is not the case, you are not drinking enough water. Please note that some of the treatments indicated below will not be effective if you are not adequately hydrated. Pain or fever: Ibuprofen, Naproxen, or acetaminophen (generic for Tylenol) for pain or fever.  Cough: Use the benzonatate (generic for Tessalon) for cough.  Albuterol: May use the albuterol as needed for instances of shortness of breath. Prednisone: Take the prednisone, as directed, in its entirety. Zyrtec or Claritin: May add these medication daily to control underlying symptoms of congestion, sneezing, and other signs of allergies.  These medications are available over-the-counter. Generics: Cetirizine (generic for Zyrtec) and loratadine (generic for Claritin). Fluticasone: Use fluticasone (generic for Flonase), as directed, for nasal and sinus congestion.  This medication is available over-the-counter. Congestion: Plain guaifenesin (generic for plain Mucinex) may help relieve congestion. Saline sinus rinses and saline nasal sprays may also help relieve  congestion. If you do not have heart problems or an allergy to such medications, you may also try phenylephrine or Sudafed. Follow up: Follow up with a primary care provider within the next two weeks should symptoms fail to resolve. Return: Return to the ED for significantly worsening symptoms, shortness of breath, persistent vomiting, large amounts of blood in stool, or any other major concerns.

## 2018-03-06 ENCOUNTER — Inpatient Hospital Stay: Payer: Medicare Other

## 2018-03-06 VITALS — BP 151/72 | HR 60 | Temp 98.7°F | Resp 18

## 2018-03-06 DIAGNOSIS — R011 Cardiac murmur, unspecified: Secondary | ICD-10-CM | POA: Diagnosis not present

## 2018-03-06 DIAGNOSIS — E785 Hyperlipidemia, unspecified: Secondary | ICD-10-CM | POA: Diagnosis not present

## 2018-03-06 DIAGNOSIS — Z79899 Other long term (current) drug therapy: Secondary | ICD-10-CM | POA: Diagnosis not present

## 2018-03-06 DIAGNOSIS — E119 Type 2 diabetes mellitus without complications: Secondary | ICD-10-CM | POA: Diagnosis not present

## 2018-03-06 DIAGNOSIS — D649 Anemia, unspecified: Secondary | ICD-10-CM

## 2018-03-06 DIAGNOSIS — R05 Cough: Secondary | ICD-10-CM | POA: Diagnosis not present

## 2018-03-06 DIAGNOSIS — J449 Chronic obstructive pulmonary disease, unspecified: Secondary | ICD-10-CM | POA: Diagnosis not present

## 2018-03-06 DIAGNOSIS — D509 Iron deficiency anemia, unspecified: Secondary | ICD-10-CM | POA: Diagnosis not present

## 2018-03-06 DIAGNOSIS — G8929 Other chronic pain: Secondary | ICD-10-CM | POA: Diagnosis not present

## 2018-03-06 DIAGNOSIS — G4730 Sleep apnea, unspecified: Secondary | ICD-10-CM | POA: Diagnosis not present

## 2018-03-06 DIAGNOSIS — R600 Localized edema: Secondary | ICD-10-CM | POA: Diagnosis not present

## 2018-03-06 DIAGNOSIS — Z86718 Personal history of other venous thrombosis and embolism: Secondary | ICD-10-CM | POA: Diagnosis not present

## 2018-03-06 DIAGNOSIS — I10 Essential (primary) hypertension: Secondary | ICD-10-CM | POA: Diagnosis not present

## 2018-03-06 DIAGNOSIS — Z7984 Long term (current) use of oral hypoglycemic drugs: Secondary | ICD-10-CM | POA: Diagnosis not present

## 2018-03-06 DIAGNOSIS — K5521 Angiodysplasia of colon with hemorrhage: Secondary | ICD-10-CM | POA: Diagnosis not present

## 2018-03-06 DIAGNOSIS — K219 Gastro-esophageal reflux disease without esophagitis: Secondary | ICD-10-CM | POA: Diagnosis not present

## 2018-03-06 MED ORDER — SODIUM CHLORIDE 0.9 % IV SOLN
510.0000 mg | Freq: Once | INTRAVENOUS | Status: AC
  Administered 2018-03-06: 510 mg via INTRAVENOUS
  Filled 2018-03-06: qty 17

## 2018-03-06 MED ORDER — SODIUM CHLORIDE 0.9 % IV SOLN
Freq: Once | INTRAVENOUS | Status: AC
  Administered 2018-03-06: 15:00:00 via INTRAVENOUS
  Filled 2018-03-06: qty 250

## 2018-03-06 NOTE — Patient Instructions (Signed)

## 2018-03-09 NOTE — Progress Notes (Signed)
[facility] Note  03/10/2018     CHIEF COMPLAINT Patient presents for Retina Evaluation and Diabetic Eye Exam   HISTORY OF PRESENT ILLNESS: [name] is a 67 y.o. female who presents to the clinic today for:   HPI    Retina Evaluation    In both eyes.  This started 5 years ago.  Associated Symptoms Floaters.  Negative for Blind Spot, Distortion, Flashes, Trauma, Weight Loss, Scalp Tenderness, Redness, Pain, Photophobia, Jaw Claudication, Fatigue, Glare, Fever and Shoulder/Hip pain.  Context:  distance vision, mid-range vision and near vision.  Treatments tried include surgery.  Response to treatment was significant improvement.  I, the attending physician,  performed the HPI with the patient and updated documentation appropriately.          Diabetic Eye Exam    Vision is stable.  Associated Symptoms Negative for Blind Spot, Glare, Shoulder/Hip pain, Fatigue, Jaw Claudication, Photophobia, Distortion, Floaters, Redness, Scalp Tenderness, Weight Loss, Fever, Trauma, Pain and Flashes.  Diabetes characteristics include Type 2 and taking oral medications.  This started 10 years ago.  Blood sugar level is controlled.  Last Blood Glucose 85.  I, the attending physician,  performed the HPI with the patient and updated documentation appropriately.          Comments    Referral of Dr. Vista Lawman for DM exam. Patient states she has been diabetic for Appx. 71yr. BS are WINL per patient , BS 85 (03/09/18)A1c (unknown), BS controlled by metformin. Pt states she has occasional  floaters and blurry vision,she contributed  it to her "salts".Pt reports she had cataract sx OU 3-4 yrs ago with significant improvement.        Last edited by ZBernarda Caffey MD on 03/10/2018 10:47 AM. (History)    Pt states she was referred by PCP for DM exam; Pt reports being diabetic x 10 years; Pt endorses having cataract sx OU;   Referring physician: OBenito Mccreedy MD [address] [place] [id number] HISTORICAL INFORMATION:   Selected notes from the MEDICAL RECORD NUMBER Referred by Dr. GIona BeardOsei-Bonsu for DM exam LEE:  Ocular Hx- PMH-COPD, high cholesterol, HTN, DM (takes metformin), diabetic neuropathy, current smoker    CURRENT MEDICATIONS: No current outpatient medications on file. (Ophthalmic Drugs)   No current facility-administered medications for this visit.  (Ophthalmic Drugs)   Current Outpatient Medications (Other)  Medication Sig  . albuterol (PROVENTIL HFA;VENTOLIN HFA) 108 (90 Base) MCG/ACT inhaler Inhale 1-2 puffs into the lungs every 6 (six) hours as needed for wheezing or shortness of breath.  .Marland Kitchenaspirin EC 81 MG tablet Take 81 mg by mouth 3 (three) times a week.   .Marland Kitchenatorvastatin (LIPITOR) 40 MG tablet Take 40 mg by mouth daily.  . benzonatate (TESSALON) 100 MG capsule Take 1 capsule (100 mg total) by mouth every 8 (eight) hours.  . Blood Glucose Monitoring Suppl (ACCU-CHEK NANO SMARTVIEW) w/Device KIT See admin instructions.  . clopidogrel (PLAVIX) 75 MG tablet Take 1 tablet (75 mg total) by mouth daily with breakfast.  . gabapentin (NEURONTIN) 600 MG tablet Take 1 tablet (600 mg total) by mouth at bedtime.  . hydrALAZINE (APRESOLINE) 50 MG tablet Take 50 mg by mouth 3 (three) times daily.   . metFORMIN (GLUCOPHAGE) 500 MG tablet Take 0.5 tablets (250 mg total) by mouth 2 (two) times daily with a meal.  . nitroGLYCERIN (NITROSTAT) 0.4 MG SL tablet Place 0.4 mg under the  tongue every 5 (five) minutes as needed for chest pain.  . predniSONE (STERAPRED UNI-PAK 21 TAB) 10 MG (21) TBPK tablet Take 6 tabs (46m) day 1, 5 tabs (537m day 2, 4 tabs (4039mday 3, 3 tabs (71m53may 4, 2 tabs (20mg62my 5, and 1 tab (10mg)73m 6.  . spironolactone (ALDACTONE) 25 MG tablet Take 25 mg by mouth daily.  . FeFuMarland Kitchen-FePoly-FA-B Cmp-C-Biot (INTEGRA PLUS) CAPS Take 1 capsule by mouth daily.   No current facility-administered medications for this  visit.  (Other)      REVIEW OF SYSTEMS: ROS    Positive for: Endocrine, Eyes   Negative for: Constitutional, Gastrointestinal, Neurological, Skin, Genitourinary, Musculoskeletal, HENT, Cardiovascular, Respiratory, Psychiatric, Allergic/Imm, Heme/Lymph   Last edited by KendriZenovia Jordanon 03/10/21/69/4854 A6:27History)       ALLERGIES No Known Allergies  PAST MEDICAL HISTORY Past Medical History:  Diagnosis Date  . Anemia 12/26/2015  . Anxiety   . Arthritis   . Blood transfusion without reported diagnosis   . Chronic pain   . COPD (chronic obstructive pulmonary disease) (HCC)  [place] of right lower extremity (HCC) 7Donald2014   Dr. Gangi,Nadyne Coombest in place  . Depression   . Diabetes mellitus without complication (HCC)  [place] (gastroesophageal reflux disease)   . Heart murmur   . Hyperlipemia   . Hypertension   . Sleep apnea    uses a cpap  . Wears glasses    Past Surgical History:  Procedure Laterality Date  . ABDOMINAL HYSTERECTOMY    . CARDIAC CATHETERIZATION  4/15  . CATARACT EXTRACTION    . CESAREAN SECTION    . COLONOSCOPY N/A 01/08/2013   Procedure: COLONOSCOPY;  Surgeon: PatricBeryle Beams Location: [facility];  Service: Endoscopy;  Laterality: N/A;  . COLONOSCOPY N/A 12/28/2015   Procedure: COLONOSCOPY;  Surgeon: PatricCarol Ada [facility];  Service: Endoscopy;  Laterality: N/A;  . ENTEROSCOPY N/A 12/28/2015   Procedure: ENTEROSCOPY;  Surgeon: PatricCarol Ada [facility];  Laterality: N/A;  . ENTEROSCOPY N/A 02/20/2018   Procedure: ENTEROSCOPY;  Surgeon: Hung, Carol Ada Location: [facility];  Service: Endoscopy;  Laterality: N/A;  . ESOPHAGOGASTRODUODENOSCOPY N/A 01/08/2013   Procedure: ESOPHAGOGASTRODUODENOSCOPY (EGD);  Surgeon: PatricBeryle Beams [facility];  Service: Endoscopy;  Laterality: N/A;  . EYE SURGERY    . HAND SURGERY    . HOT HEMOSTASIS N/A 12/28/2015    Procedure: HOT HEMOSTASIS (ARGON PLASMA COAGULATION/BICAP);  Surgeon: PatricCarol Ada [facility];  Service: Endoscopy;  Laterality: N/A;  . HOT HEMOSTASIS N/A 02/20/2018   Procedure: HOT HEMOSTASIS (ARGON PLASMA COAGULATION/BICAP);  Surgeon: Hung, Carol Ada [facility];  Service: Endoscopy;  Laterality: N/A;  . LEFT HEART CATHETERIZATION WITH CORONARY ANGIOGRAM N/A 03/09/2013   Procedure: LEFT HEART CATHETERIZATION WITH CORONARY ANGIOGRAM;  Surgeon: JagadeLaverda Page [facility];  Service: Cardiovascular;  Laterality: N/A;  . LOWER EXTREMITY ANGIOGRAM N/A 12/29/2012   Procedure: LOWER EXTREMITY ANGIOGRAM;  Surgeon: JagadeLaverda Page [facility];  Service: Cardiovascular;  Laterality: N/A;  . LOWER EXTREMITY ANGIOGRAM N/A 10/05/2013   Procedure: LOWER EXTREMITY ANGIOGRAM;  Surgeon: JagadeLaverda Page [facility];  Service: Cardiovascular;  Laterality: N/A;  . LOWER EXTREMITY ANGIOGRAPHY N/A 02/11/2017   Procedure: Lower Extremity Angiography;  Surgeon: Ganji,Adrian Prows  Location: [place] CV LAB;  Service: Cardiovascular;  Laterality: N/A;  . PERIPHERAL VASCULAR BALLOON ANGIOPLASTY  02/11/2017   Procedure: PERIPHERAL VASCULAR BALLOON ANGIOPLASTY;  Surgeon: Adrian Prows, MD;  Location: [place] CV LAB;  Service: Cardiovascular;;  Right SFA  . PERIPHERAL VASCULAR INTERVENTION Right 02/11/2017   Procedure: PERIPHERAL VASCULAR INTERVENTION;  Surgeon: Adrian Prows, MD;  Location: [place] [facility] LAB;  Service: Cardiovascular;  Laterality: Right;  Rt SFA  . stent in right leg  12/29/2012   for blood clot, Dr. Nadyne Coombes  . TYMPANOMASTOIDECTOMY Left 03/08/2014   Procedure: TYMPANOMASTOIDECTOMY LEFT;  Surgeon: Ascencion Dike, MD;  Location: [place];  Service: ENT;  Laterality: Left;  . TYMPANOMASTOIDECTOMY  2015  . UTERINE FIBROID SURGERY      FAMILY HISTORY Family History  Problem Relation Age of Onset  . Diabetes Mother    . Hypertension Mother   . Heart disease Mother   . Heart disease Father   . Hypertension Father     SOCIAL HISTORY Social History   Tobacco Use  . Smoking status: Current Every Day Smoker    Packs/day: 0.25    Years: 41.00    Pack years: 10.25    Types: Cigarettes  . Smokeless tobacco: Never Used  Substance Use Topics  . Alcohol use: No  . Drug use: Yes    Types: Marijuana    Comment: last-02/24/14         OPHTHALMIC EXAM:  Base Eye Exam    Visual Acuity (Snellen - Linear)      Right Left   Dist [place] 20/20 20/25 +1   Dist ph [place] NI 20/25 +2       Tonometry (Tonopen, 9:35 AM)      Right Left   Pressure 14 16       Pupils      Dark Light Shape React APD   Right 4 3 Round Brisk None   Left 4 3 Round Brisk None       Visual Fields (Counting fingers)      Left Right    Full Full       Extraocular Movement      Right Left    Full, Ortho Full, Ortho       Neuro/Psych    Oriented x3:  Yes   Mood/Affect:  Normal       Dilation    Both eyes:  1.0% Mydriacyl, 2.5% Phenylephrine @ 9:36 AM        Slit Lamp and Fundus Exam    Slit Lamp Exam      Right Left   Lids/Lashes Dermatochalasis - upper lid, Meibomian gland dysfunction Dermatochalasis - upper lid, Meibomian gland dysfunction   Conjunctiva/Sclera Melanosis Melanosis   Cornea Arcus, Well healed cataract wounds Arcus, Well healed cataract wounds, 1+ Punctate epithelial erosions   Anterior Chamber Deep and quiet Deep and quiet   Iris Round and moderately dilated, No NVI Round and moderately dilated, No NVI   Lens PC IOL in good position with mild anterior capsular fimosis PC IOL in good position with mild anterior capsular fimosis   Vitreous Vitreous syneresis Vitreous syneresis       Fundus Exam      Right Left   Disc Pink and Sharp Pink and Sharp   C/D Ratio 0.4 0.4   Macula Good foveal reflex, Retinal pigment epithelial mottling, No heme or edema Good foveal reflex, Retinal pigment epithelial  mottling, No heme or edema  Vessels Tortuous, AV crossing changes Tortuous, AV crossing changes   Periphery Attached Attached        Refraction    Manifest Refraction (Retinoscopy)      Sphere Cylinder Axis Dist VA   Right -0.50 +0.50 095 20/20   Left [place]   20/25          IMAGING AND PROCEDURES  Imaging and Procedures for _0 @  OCT, Retina - OU - Both Eyes       Right Eye Quality was good. Central Foveal Thickness: 283. Progression has no prior data. Findings include normal foveal contour, no IRF, no SRF.   Left Eye Quality was good. Central Foveal Thickness: 278. Progression has no prior data. Findings include normal foveal contour, no IRF, no SRF, vitreomacular adhesion .   Notes *Images captured and stored on drive  Diagnosis / Impression:  NFP, No IRF/SRF OU No DME OU  Clinical management:  See below  Abbreviations: NFP - Normal foveal profile. CME - cystoid macular edema. PED - pigment epithelial detachment. IRF - intraretinal fluid. SRF - subretinal fluid. EZ - ellipsoid zone. ERM - epiretinal membrane. ORA - outer retinal atrophy. ORT - outer retinal tubulation. SRHM - subretinal hyper-reflective material         CBC with Differential (Cancer Center Only)     Component Value Flag Ref Range Units Status   WBC Count 9.0    3.9 - 10.3 K/uL Final   RBC 4.70    3.70 - 5.45 MIL/uL Final   Hemoglobin 10.5    11.6 - 15.9 g/dL Final   HCT 35.2    34.8 - 46.6 % Final   MCV 74.9    79.5 - 101.0 fL Final   MCH 22.3    25.1 - 34.0 pg Final   MCHC 29.7    31.5 - 36.0 g/dL Final   RDW 30.9    11.2 - 14.5 % Final   Platelet Count 532    145 - 400 K/uL Final   Neutrophils Relative % 71     % Final   Neutro Abs 6.4    1.5 - 6.5 K/uL Final   Lymphocytes Relative 18     % Final   Lymphs Abs 1.7    0.9 - 3.3 K/uL Final   Monocytes Relative 8     % Final   Monocytes Absolute 0.7    0.1 - 0.9 K/uL Final   Eosinophils Relative 3     % Final   Eosinophils Absolute  0.2    0.0 - 0.5 K/uL Final   Basophils Relative 0     % Final   Basophils Absolute 0.0    0.0 - 0.1 K/uL Final   Comment:   Performed at [facility], [place] [address], [place], [place] [address]                 ASSESSMENT/PLAN:    ICD-10-CM   1. Diabetes mellitus type 2 without retinopathy ([place]) E11.9   2. Retinal edema H35.81 OCT, Retina - OU - Both Eyes  3. Essential hypertension I10   4. Hypertensive retinopathy of both eyes H35.033   5. Pseudophakia of both eyes Z96.1     1. Diabetes mellitus, type 2 without retinopathy - The incidence, risk factors for progression, natural history and treatment options for diabetic retinopathy  were discussed with patient.   - The need for close monitoring of blood glucose, blood pressure, and serum lipids,  avoiding cigarette or any type of tobacco, and the need for long term follow up was also discussed with patient. - f/u in 1 year, sooner prn  2. No retinal edema on exam or OCT  3,4. Hypertensive retinopathy OU - discussed importance of tight BP control - monitor  5. Pseudophakia OU  - s/p CE/IOL OU  - beautiful surgeries, doing well  - monitor   Ophthalmic Meds Ordered this visit:  No orders of the defined types were placed in this encounter.      Return in about 1 year (around 03/11/2019) for F/U DM, DFE, OCT.  There are no Patient Instructions on file for this visit.   Explained the diagnoses, plan, and follow up with the patient and they expressed understanding.  Patient expressed understanding of the importance of proper follow up care.   This document serves as a record of services personally performed by Gardiner Sleeper, MD, PhD. It was created on their behalf by Ernest Mallick, OA, an ophthalmic assistant. The creation of this record is the provider's dictation and/or activities during the visit.    Electronically signed by: Ernest Mallick, OA  09.16.19 11:15 PM   This document serves as a  record of services personally performed by Gardiner Sleeper, MD, PhD. It was created on their behalf by Catha Brow, [place], a certified ophthalmic assistant. The creation of this record is the provider's dictation and/or activities during the visit.  Electronically signed by: Catha Brow, COA  09.17.19 11:15 PM   Gardiner Sleeper, M.D., Ph.D. Diseases & Surgery of the Retina and Vitreous [facility]   I have reviewed the above documentation for accuracy and completeness, and I agree with the above. Gardiner Sleeper, M.D., Ph.D. 03/11/18 11:16 PM    Abbreviations: M myopia (nearsighted); A astigmatism; H hyperopia (farsighted); P presbyopia; Mrx spectacle prescription;  CTL contact lenses; OD right eye; OS left eye; OU both eyes  XT exotropia; ET esotropia; PEK punctate epithelial keratitis; PEE punctate epithelial erosions; DES dry eye syndrome; MGD meibomian gland dysfunction; ATs artificial tears; PFAT's preservative free artificial tears; [place] nuclear sclerotic cataract; PSC posterior subcapsular cataract; ERM epi-retinal membrane; PVD posterior vitreous detachment; RD retinal detachment; DM diabetes mellitus; DR diabetic retinopathy; NPDR non-proliferative diabetic retinopathy; PDR proliferative diabetic retinopathy; CSME clinically significant macular edema; DME diabetic macular edema; dbh dot blot hemorrhages; CWS cotton wool spot; POAG primary open angle glaucoma; C/D cup-to-disc ratio; HVF humphrey visual field; GVF goldmann visual field; OCT optical coherence tomography; IOP intraocular pressure; BRVO Branch retinal vein occlusion; CRVO central retinal vein occlusion; CRAO central retinal artery occlusion; BRAO branch retinal artery occlusion; RT retinal tear; SB scleral buckle; PPV pars plana vitrectomy; VH Vitreous hemorrhage; PRP panretinal laser photocoagulation; IVK intravitreal kenalog; VMT vitreomacular traction; MH Macular hole;  NVD neovascularization of the  disc; NVE neovascularization elsewhere; AREDS age related eye disease study; ARMD age related macular degeneration; POAG primary open angle glaucoma; EBMD epithelial/anterior basement membrane dystrophy; ACIOL anterior chamber intraocular lens; IOL intraocular lens; PCIOL posterior chamber intraocular lens; Phaco/IOL phacoemulsification with intraocular lens placement; [place] photorefractive keratectomy; LASIK laser assisted in situ keratomileusis; HTN hypertension; DM diabetes mellitus; COPD chronic obstructive pulmonary disease

## 2018-03-10 ENCOUNTER — Inpatient Hospital Stay: Payer: Medicare Other

## 2018-03-10 ENCOUNTER — Ambulatory Visit (INDEPENDENT_AMBULATORY_CARE_PROVIDER_SITE_OTHER): Payer: Medicare Other | Admitting: Ophthalmology

## 2018-03-10 ENCOUNTER — Encounter: Payer: Self-pay | Admitting: Oncology

## 2018-03-10 ENCOUNTER — Other Ambulatory Visit: Payer: Self-pay

## 2018-03-10 ENCOUNTER — Encounter (INDEPENDENT_AMBULATORY_CARE_PROVIDER_SITE_OTHER): Payer: Self-pay | Admitting: Ophthalmology

## 2018-03-10 ENCOUNTER — Telehealth: Payer: Self-pay | Admitting: Internal Medicine

## 2018-03-10 ENCOUNTER — Inpatient Hospital Stay (HOSPITAL_BASED_OUTPATIENT_CLINIC_OR_DEPARTMENT_OTHER): Payer: Medicare Other | Admitting: Oncology

## 2018-03-10 VITALS — BP 159/92 | HR 60 | Temp 98.3°F | Resp 16 | Ht 60.0 in | Wt 208.0 lb

## 2018-03-10 DIAGNOSIS — K219 Gastro-esophageal reflux disease without esophagitis: Secondary | ICD-10-CM

## 2018-03-10 DIAGNOSIS — D509 Iron deficiency anemia, unspecified: Secondary | ICD-10-CM

## 2018-03-10 DIAGNOSIS — H3581 Retinal edema: Secondary | ICD-10-CM | POA: Diagnosis not present

## 2018-03-10 DIAGNOSIS — Z961 Presence of intraocular lens: Secondary | ICD-10-CM

## 2018-03-10 DIAGNOSIS — Z86718 Personal history of other venous thrombosis and embolism: Secondary | ICD-10-CM

## 2018-03-10 DIAGNOSIS — K5521 Angiodysplasia of colon with hemorrhage: Secondary | ICD-10-CM

## 2018-03-10 DIAGNOSIS — R011 Cardiac murmur, unspecified: Secondary | ICD-10-CM

## 2018-03-10 DIAGNOSIS — G8929 Other chronic pain: Secondary | ICD-10-CM

## 2018-03-10 DIAGNOSIS — E119 Type 2 diabetes mellitus without complications: Secondary | ICD-10-CM

## 2018-03-10 DIAGNOSIS — J449 Chronic obstructive pulmonary disease, unspecified: Secondary | ICD-10-CM | POA: Diagnosis not present

## 2018-03-10 DIAGNOSIS — E785 Hyperlipidemia, unspecified: Secondary | ICD-10-CM | POA: Diagnosis not present

## 2018-03-10 DIAGNOSIS — H35033 Hypertensive retinopathy, bilateral: Secondary | ICD-10-CM

## 2018-03-10 DIAGNOSIS — F418 Other specified anxiety disorders: Secondary | ICD-10-CM

## 2018-03-10 DIAGNOSIS — I10 Essential (primary) hypertension: Secondary | ICD-10-CM

## 2018-03-10 DIAGNOSIS — Z79899 Other long term (current) drug therapy: Secondary | ICD-10-CM | POA: Diagnosis not present

## 2018-03-10 DIAGNOSIS — G4730 Sleep apnea, unspecified: Secondary | ICD-10-CM | POA: Diagnosis not present

## 2018-03-10 DIAGNOSIS — D539 Nutritional anemia, unspecified: Secondary | ICD-10-CM

## 2018-03-10 DIAGNOSIS — D649 Anemia, unspecified: Secondary | ICD-10-CM

## 2018-03-10 DIAGNOSIS — Z7984 Long term (current) use of oral hypoglycemic drugs: Secondary | ICD-10-CM

## 2018-03-10 LAB — CBC WITH DIFFERENTIAL (CANCER CENTER ONLY)
BASOS ABS: 0 10*3/uL (ref 0.0–0.1)
BASOS PCT: 0 %
EOS ABS: 0.2 10*3/uL (ref 0.0–0.5)
Eosinophils Relative: 3 %
HEMATOCRIT: 35.2 % (ref 34.8–46.6)
HEMOGLOBIN: 10.5 g/dL — AB (ref 11.6–15.9)
Lymphocytes Relative: 18 %
Lymphs Abs: 1.7 10*3/uL (ref 0.9–3.3)
MCH: 22.3 pg — ABNORMAL LOW (ref 25.1–34.0)
MCHC: 29.7 g/dL — ABNORMAL LOW (ref 31.5–36.0)
MCV: 74.9 fL — ABNORMAL LOW (ref 79.5–101.0)
Monocytes Absolute: 0.7 10*3/uL (ref 0.1–0.9)
Monocytes Relative: 8 %
NEUTROS ABS: 6.4 10*3/uL (ref 1.5–6.5)
NEUTROS PCT: 71 %
Platelet Count: 532 10*3/uL — ABNORMAL HIGH (ref 145–400)
RBC: 4.7 MIL/uL (ref 3.70–5.45)
RDW: 30.9 % — AB (ref 11.2–14.5)
WBC Count: 9 10*3/uL (ref 3.9–10.3)

## 2018-03-10 MED ORDER — INTEGRA PLUS PO CAPS: 1.0000 | ORAL_CAPSULE | Freq: Every day | ORAL | 2 refills | Status: DC

## 2018-03-10 NOTE — Progress Notes (Signed)
[facility] OFFICE PROGRESS NOTE  Carla Mccreedy, MD [address] [place] [address]  DIAGNOSIS: Iron deficiency anemia.  PRIOR THERAPY: 2 units of packed red blood cells on 02/14/2018.  She also received Feraheme 510 mg IV on 02/27/2018 and 03/06/2018.  CURRENT THERAPY: Integra Plus 1 tablet daily.  INTERVAL HISTORY: [name] 67 y.o. female returns for routine follow-up visit by herself.  The patient reports that she is feeling better since her last visit.  Her fatigue and dizziness have improved.  She is no longer short of breath with exertion.  She denies fevers and chills.  Denies chest pain, cough, hemoptysis.  Denies nausea, vomiting, constipation, diarrhea.  Denies recent weight loss or night sweats.  Denies bleeding but states that her stools have been dark again lately.  She has not seen any obvious blood.  The patient had heme positive stools and underwent an upper endoscopy by Dr. Benson Norway on 02/20/2018.  There were AVMs which underwent ablation.  The patient is here for evaluation and repeat lab work.  MEDICAL HISTORY: Past Medical History:  Diagnosis Date  . Anemia 12/26/2015  . Anxiety   . Arthritis   . Blood transfusion without reported diagnosis   . Chronic pain   . COPD (chronic obstructive pulmonary disease) ([place])   . Deep vein blood clot of right lower extremity ([place]) 12/29/2012   Dr. Nadyne Coombes, stent in place  . Depression   . Diabetes mellitus without complication ([place])   . GERD (gastroesophageal reflux disease)   . Heart murmur   . Hyperlipemia   . Hypertension   . Sleep apnea    uses a cpap  . Wears glasses     ALLERGIES:  has No Known Allergies.  MEDICATIONS:  Current Outpatient Medications  Medication Sig Dispense Refill  . albuterol (PROVENTIL HFA;VENTOLIN HFA) 108 (90 Base) MCG/ACT inhaler Inhale 1-2 puffs into the lungs every 6 (six) hours as needed for wheezing or shortness of breath.    Marland Kitchen aspirin EC 81 MG tablet Take  81 mg by mouth 3 (three) times a week.     Marland Kitchen atorvastatin (LIPITOR) 40 MG tablet Take 40 mg by mouth daily.    . benzonatate (TESSALON) 100 MG capsule Take 1 capsule (100 mg total) by mouth every 8 (eight) hours. 21 capsule 0  . Blood Glucose Monitoring Suppl (ACCU-CHEK NANO SMARTVIEW) w/Device KIT See admin instructions.  0  . clopidogrel (PLAVIX) 75 MG tablet Take 1 tablet (75 mg total) by mouth daily with breakfast.    . doxycycline (VIBRAMYCIN) 100 MG capsule Take 1 capsule (100 mg total) by mouth 2 (two) times daily for 5 days. 10 capsule 0  . gabapentin (NEURONTIN) 600 MG tablet Take 1 tablet (600 mg total) by mouth at bedtime. 30 tablet 11  . hydrALAZINE (APRESOLINE) 50 MG tablet Take 50 mg by mouth 3 (three) times daily.     . metFORMIN (GLUCOPHAGE) 500 MG tablet Take 0.5 tablets (250 mg total) by mouth 2 (two) times daily with a meal.    . nitroGLYCERIN (NITROSTAT) 0.4 MG SL tablet Place 0.4 mg under the tongue every 5 (five) minutes as needed for chest pain.    . predniSONE (STERAPRED UNI-PAK 21 TAB) 10 MG (21) TBPK tablet Take 6 tabs ('60mg'$ ) day 1, 5 tabs ('50mg'$ ) day 2, 4 tabs ('40mg'$ ) day 3, 3 tabs ('30mg'$ ) day 4, 2 tabs ('20mg'$ ) day 5, and 1 tab ('10mg'$ ) day 6. 21 tablet 0  .  spironolactone (ALDACTONE) 25 MG tablet Take 25 mg by mouth daily.    Marland Kitchen FeFum-FePoly-FA-B Cmp-C-Biot (INTEGRA PLUS) CAPS Take 1 capsule by mouth daily. 30 capsule 2   No current facility-administered medications for this visit.     SURGICAL HISTORY:  Past Surgical History:  Procedure Laterality Date  . ABDOMINAL HYSTERECTOMY    . CARDIAC CATHETERIZATION  4/15  . CATARACT EXTRACTION    . CESAREAN SECTION    . COLONOSCOPY N/A 01/08/2013   Procedure: COLONOSCOPY;  Surgeon: Beryle Beams, MD;  Location: [facility] ENDOSCOPY;  Service: Endoscopy;  Laterality: N/A;  . COLONOSCOPY N/A 12/28/2015   Procedure: COLONOSCOPY;  Surgeon: Carol Ada, MD;  Location: [facility] ENDOSCOPY;  Service: Endoscopy;  Laterality: N/A;  . ENTEROSCOPY N/A  12/28/2015   Procedure: ENTEROSCOPY;  Surgeon: Carol Ada, MD;  Location: [place];  Service: Endoscopy;  Laterality: N/A;  . ENTEROSCOPY N/A 02/20/2018   Procedure: ENTEROSCOPY;  Surgeon: Carol Ada, MD;  Location: [facility] ENDOSCOPY;  Service: Endoscopy;  Laterality: N/A;  . ESOPHAGOGASTRODUODENOSCOPY N/A 01/08/2013   Procedure: ESOPHAGOGASTRODUODENOSCOPY (EGD);  Surgeon: Beryle Beams, MD;  Location: [facility] ENDOSCOPY;  Service: Endoscopy;  Laterality: N/A;  . EYE SURGERY    . HAND SURGERY    . HOT HEMOSTASIS N/A 12/28/2015   Procedure: HOT HEMOSTASIS (ARGON PLASMA COAGULATION/BICAP);  Surgeon: Carol Ada, MD;  Location: [facility] ENDOSCOPY;  Service: Endoscopy;  Laterality: N/A;  . HOT HEMOSTASIS N/A 02/20/2018   Procedure: HOT HEMOSTASIS (ARGON PLASMA COAGULATION/BICAP);  Surgeon: Carol Ada, MD;  Location: [facility] ENDOSCOPY;  Service: Endoscopy;  Laterality: N/A;  . LEFT HEART CATHETERIZATION WITH CORONARY ANGIOGRAM N/A 03/09/2013   Procedure: LEFT HEART CATHETERIZATION WITH CORONARY ANGIOGRAM;  Surgeon: Laverda Page, MD;  Location: [facility] CATH LAB;  Service: Cardiovascular;  Laterality: N/A;  . LOWER EXTREMITY ANGIOGRAM N/A 12/29/2012   Procedure: LOWER EXTREMITY ANGIOGRAM;  Surgeon: Laverda Page, MD;  [facility] C[facility] LAB;  Service: Cardiovascular;  Laterality: N/A;  . LOWER EXTREMITY ANGIOGRAM N/A 10/05/2013   Procedure: LOWER EXTREMITY ANGIOGRAM;  Surgeon: Laverda Page, MD;  Location: [facility] CATH LAB;  Service: Cardiovascular;  Laterality: N/A;  . LOWER EXTREMITY ANGIOGRAPHY N/A 02/11/2017   Procedure: Lower Extremity Angiography;  Surgeon: Adrian Prows, MD;  Location: [facility] CV LAB;  Service: Cardiovascular;  Laterality: N/A;  . PERIPHERAL VASCULAR BALLOON ANGIOPLASTY  02/11/2017   Procedure: PERIPHERAL VASCULAR BALLOON ANGIOPLASTY;  Surgeon: Adrian Prows, MD;  Location: [place] CV LAB;  Service: Cardiovascular;;  Right SFA  . PERIPHERAL VASCULAR INTERVENTION Right 02/11/2017   Procedure:  PERIPHERAL VASCULAR INTERVENTION;  Surgeon: Adrian Prows, MD;  Location: [place] CV LAB;  Service: Cardiovascular;  Laterality: Right;  Rt SFA  . stent in right leg  12/29/2012   for blood clot, Dr. Nadyne Coombes  . TYMPANOMASTOIDECTOMY Left 03/08/2014   Procedure: TYMPANOMASTOIDECTOMY LEFT;  Surgeon: Ascencion Dike, MD;  Location: [place];  Service: ENT;  Laterality: Left;  . TYMPANOMASTOIDECTOMY  2015  . UTERINE FIBROID SURGERY      REVIEW OF SYSTEMS:   Review of Systems  Constitutional: Negative for appetite change, chills, fever and unexpected weight change.  Positive for mild fatigue but this is improving. HENT:   Negative for mouth sores, nosebleeds, sore throat and trouble swallowing.   Eyes: Negative for eye problems and icterus.  Respiratory: Negative for cough, hemoptysis, shortness of breath and wheezing.   Cardiovascular: Negative for chest pain and leg swelling.  Gastrointestinal: Negative for abdominal pain, constipation, diarrhea,  nausea and vomiting.  Positive for dark stools. Genitourinary: Negative for bladder incontinence, difficulty urinating, dysuria, frequency and hematuria.   Musculoskeletal: Negative for back pain, gait problem, neck pain and neck stiffness.  Skin: Negative for itching and rash.  Neurological: Negative for dizziness, extremity weakness, gait problem, headaches, light-headedness and seizures.  Hematological: Negative for adenopathy. Does not bruise/bleed easily.  Psychiatric/Behavioral: Negative for confusion, depression and sleep disturbance. The patient is not nervous/anxious.     PHYSICAL EXAMINATION:  Blood pressure (!) 159/92, pulse 60, temperature 98.3 F (36.8 C), temperature source Oral, resp. rate 16, height 5' (1.524 m), weight 208 lb (94.3 kg), SpO2 96 %.  ECOG PERFORMANCE STATUS: 1 - Symptomatic but completely ambulatory  Physical Exam  Constitutional: Oriented to person, place, and time and well-developed, well-nourished, and  in no distress. No distress.  HENT:  Head: Normocephalic and atraumatic.  Mouth/Throat: Oropharynx is clear and moist. No oropharyngeal exudate.  Eyes: Conjunctivae are normal. Right eye exhibits no discharge. Left eye exhibits no discharge. No scleral icterus.  Neck: Normal range of motion. Neck supple.  Cardiovascular: Normal rate, regular rhythm, normal heart sounds and intact distal pulses.   Pulmonary/Chest: Effort normal and breath sounds normal. No respiratory distress. No wheezes. No rales.  Abdominal: Soft. Bowel sounds are normal. Exhibits no distension and no mass. There is no tenderness.  Musculoskeletal: Normal range of motion. Exhibits no edema.  Lymphadenopathy:    No cervical adenopathy.  Neurological: Alert and oriented to person, place, and time. Exhibits normal muscle tone. Gait normal. Coordination normal.  Skin: Skin is warm and dry. No rash noted. Not diaphoretic. No erythema. No pallor.  Psychiatric: Mood, memory and judgment normal.  Vitals reviewed.  LABORATORY DATA: Lab Results  Component Value Date   WBC 9.0 03/10/2018   HGB 10.5 (L) 03/10/2018   HCT 35.2 03/10/2018   MCV 74.9 (L) 03/10/2018   PLT 532 (H) 03/10/2018      Chemistry      Component Value Date/Time   NA 142 03/05/2018 0831   K 3.4 (L) 03/05/2018 0831   CL 108 03/05/2018 0831   CO2 25 03/05/2018 0831   BUN 9 03/05/2018 0831   CREATININE 0.90 03/05/2018 0831      Component Value Date/Time   CALCIUM 9.0 03/05/2018 0831   ALKPHOS 64 03/05/2018 0831   AST 20 03/05/2018 0831   ALT 10 03/05/2018 0831   BILITOT 0.5 03/05/2018 0831       RADIOGRAPHIC STUDIES:  Dg Chest 2 View  Result Date: 03/05/2018 CLINICAL DATA:  Cough for 1 month. EXAM: CHEST - 2 VIEW COMPARISON:  10/17/2017 FINDINGS: Lingular scarring. Heart is mildly enlarged. Right lung clear. No effusions or acute infiltrates. No acute bony abnormality. IMPRESSION: Cardiomegaly.  Lingular scarring.  No active disease.  Electronically Signed   By: Rolm Baptise M.D.   On: 03/05/2018 09:22     ASSESSMENT/PLAN:  Symptomatic anemia This is a very pleasant 67 year old African-American female presented with severe iron deficiency anemia due to bleeding AVMs.  She underwent a recent upper endoscopy. She is here for evaluation and repeat lab work.  The patient was seen with Dr. Julien Nordmann.  CBC from today shows a hemoglobin of 10.5 which has improved.  Her symptoms have improved as well.  Recommend for her to begin an oral iron tablet.  I have prescribed Integra Plus 1 tablet daily.  We will continue to watch her blood work closely.  She will return in 1  month for repeat CBC, iron studies, and ferritin.  For the bleeding AVMs, she was encouraged to keep her follow-up with Dr. Benson Norway.  For hypertension, I have recommended that she take her blood pressure medication on a regular basis and follow-up with her primary care provider for adjustment of her medication.  She was advised to call immediately if she has any concerning symptoms in the interval. The patient voices understanding of current disease status and treatment options and is in agreement with the current care plan.  All questions were answered. The patient knows to call the clinic with any problems, questions or concerns. We can certainly see the patient much sooner if necessary.   Orders Placed This Encounter  Procedures  . CBC with Differential (Cancer Center Only)    Standing Status:   Future    Standing Expiration Date:   03/11/2019  . Ferritin    Standing Status:   Future    Standing Expiration Date:   03/11/2019  . Iron and TIBC    Standing Status:   Future    Standing Expiration Date:   03/11/2019     Mikey Bussing, DNP, AGPCNP-BC, AOCNP 03/10/18   ADDENDUM: Hematology/Oncology Attending: I had a face-to-face encounter with the patient.  I recommended her care plan.  This is a very pleasant 67 years old African-American female with severe  iron deficiency anemia secondary to GI bleed from AV malformation.  The patient was seen recently by her gastroenterologist Dr. Benson Norway.  She underwent ablation to some of these lesions.  She also received 2 units of PRBCs transfusion in addition to Feraheme infusion at the cancer center.  She is here today for evaluation with repeat CBC. Her CBC showed significant improvement in her hemoglobin and now up to 10.5 and hematocrit 35.2%. I recommended for the patient to start taking oral iron tablets with Integra +1 capsule p.o. daily.  We will see her back for follow-up visit in 1 month for evaluation with repeat CBC, iron study and ferritin. She was advised to call immediately if she has any concerning symptoms in the interval.  Disclaimer: This note was dictated with voice recognition software. Similar sounding words can inadvertently be transcribed and may be missed upon review. Eilleen Kempf, MD 03/11/18

## 2018-03-10 NOTE — Telephone Encounter (Signed)
Appts scheduled AVS declined/calendar printed per 9/17 los

## 2018-03-10 NOTE — Assessment & Plan Note (Signed)
This is a very pleasant 67 year old African-American female presented with severe iron deficiency anemia due to bleeding AVMs.  She underwent a recent upper endoscopy. She is here for evaluation and repeat lab work.  The patient was seen with Dr. Julien Nordmann.  CBC from today shows a hemoglobin of 10.5 which has improved.  Her symptoms have improved as well.  Recommend for her to begin an oral iron tablet.  I have prescribed Integra Plus 1 tablet daily.  We will continue to watch her blood work closely.  She will return in 1 month for repeat CBC, iron studies, and ferritin.  For the bleeding AVMs, she was encouraged to keep her follow-up with Dr. Benson Norway.  For hypertension, I have recommended that she take her blood pressure medication on a regular basis and follow-up with her primary care provider for adjustment of her medication.  She was advised to call immediately if she has any concerning symptoms in the interval. The patient voices understanding of current disease status and treatment options and is in agreement with the current care plan.  All questions were answered. The patient knows to call the clinic with any problems, questions or concerns. We can certainly see the patient much sooner if necessary.

## 2018-03-11 ENCOUNTER — Encounter (INDEPENDENT_AMBULATORY_CARE_PROVIDER_SITE_OTHER): Payer: Self-pay | Admitting: Ophthalmology

## 2018-03-16 DIAGNOSIS — R195 Other fecal abnormalities: Secondary | ICD-10-CM | POA: Diagnosis not present

## 2018-03-16 DIAGNOSIS — K921 Melena: Secondary | ICD-10-CM | POA: Diagnosis not present

## 2018-03-16 DIAGNOSIS — K31811 Angiodysplasia of stomach and duodenum with bleeding: Secondary | ICD-10-CM | POA: Diagnosis not present

## 2018-03-23 ENCOUNTER — Other Ambulatory Visit: Payer: Self-pay | Admitting: Gastroenterology

## 2018-03-27 ENCOUNTER — Other Ambulatory Visit: Payer: Self-pay

## 2018-03-27 ENCOUNTER — Ambulatory Visit (HOSPITAL_COMMUNITY)
Admission: RE | Admit: 2018-03-27 | Discharge: 2018-03-27 | Disposition: A | Discharge status: Home / Self Care | Payer: Medicare Other | Source: Ambulatory Visit | Attending: Gastroenterology | Admitting: Gastroenterology

## 2018-03-27 ENCOUNTER — Ambulatory Visit (HOSPITAL_COMMUNITY): Payer: Medicare Other | Admitting: Certified Registered Nurse Anesthetist

## 2018-03-27 ENCOUNTER — Encounter (HOSPITAL_COMMUNITY): Payer: Self-pay | Admitting: *Deleted

## 2018-03-27 ENCOUNTER — Encounter (HOSPITAL_COMMUNITY)
Admission: RE | Disposition: A | Discharge status: Home / Self Care | Payer: Self-pay | Source: Ambulatory Visit | Attending: Gastroenterology

## 2018-03-27 DIAGNOSIS — Z7984 Long term (current) use of oral hypoglycemic drugs: Secondary | ICD-10-CM | POA: Diagnosis not present

## 2018-03-27 DIAGNOSIS — K219 Gastro-esophageal reflux disease without esophagitis: Secondary | ICD-10-CM | POA: Diagnosis not present

## 2018-03-27 DIAGNOSIS — I10 Essential (primary) hypertension: Secondary | ICD-10-CM | POA: Insufficient documentation

## 2018-03-27 DIAGNOSIS — K31819 Angiodysplasia of stomach and duodenum without bleeding: Secondary | ICD-10-CM | POA: Diagnosis not present

## 2018-03-27 DIAGNOSIS — Z6841 Body Mass Index (BMI) 40.0 and over, adult: Secondary | ICD-10-CM | POA: Insufficient documentation

## 2018-03-27 DIAGNOSIS — Z86718 Personal history of other venous thrombosis and embolism: Secondary | ICD-10-CM | POA: Diagnosis not present

## 2018-03-27 DIAGNOSIS — K319 Disease of stomach and duodenum, unspecified: Secondary | ICD-10-CM | POA: Diagnosis not present

## 2018-03-27 DIAGNOSIS — J449 Chronic obstructive pulmonary disease, unspecified: Secondary | ICD-10-CM | POA: Insufficient documentation

## 2018-03-27 DIAGNOSIS — Z79899 Other long term (current) drug therapy: Secondary | ICD-10-CM | POA: Insufficient documentation

## 2018-03-27 DIAGNOSIS — E119 Type 2 diabetes mellitus without complications: Secondary | ICD-10-CM | POA: Diagnosis not present

## 2018-03-27 DIAGNOSIS — F419 Anxiety disorder, unspecified: Secondary | ICD-10-CM | POA: Diagnosis not present

## 2018-03-27 DIAGNOSIS — K3189 Other diseases of stomach and duodenum: Secondary | ICD-10-CM | POA: Diagnosis not present

## 2018-03-27 DIAGNOSIS — E785 Hyperlipidemia, unspecified: Secondary | ICD-10-CM | POA: Insufficient documentation

## 2018-03-27 DIAGNOSIS — K921 Melena: Secondary | ICD-10-CM | POA: Diagnosis not present

## 2018-03-27 DIAGNOSIS — G4730 Sleep apnea, unspecified: Secondary | ICD-10-CM | POA: Insufficient documentation

## 2018-03-27 DIAGNOSIS — F329 Major depressive disorder, single episode, unspecified: Secondary | ICD-10-CM | POA: Insufficient documentation

## 2018-03-27 DIAGNOSIS — F17210 Nicotine dependence, cigarettes, uncomplicated: Secondary | ICD-10-CM | POA: Insufficient documentation

## 2018-03-27 HISTORY — PX: HOT HEMOSTASIS: SHX5433

## 2018-03-27 HISTORY — PX: ENTEROSCOPY: SHX5533

## 2018-03-27 LAB — GLUCOSE, CAPILLARY: Glucose-Capillary: 78 mg/dL (ref 70–99)

## 2018-03-27 SURGERY — ENTEROSCOPY
Anesthesia: Monitor Anesthesia Care

## 2018-03-27 MED ORDER — LIDOCAINE 2% (20 MG/ML) 5 ML SYRINGE
INTRAMUSCULAR | Status: DC | PRN
  Administered 2018-03-27: 100 mg via INTRAVENOUS

## 2018-03-27 MED ORDER — PROPOFOL 10 MG/ML IV BOLUS
INTRAVENOUS | Status: DC | PRN
  Administered 2018-03-27: 40 mg via INTRAVENOUS

## 2018-03-27 MED ORDER — LACTATED RINGERS IV SOLN
INTRAVENOUS | Status: DC
  Administered 2018-03-27: 1000 mL via INTRAVENOUS

## 2018-03-27 MED ORDER — PROPOFOL 500 MG/50ML IV EMUL
INTRAVENOUS | Status: DC | PRN
  Administered 2018-03-27: 125 ug/kg/min via INTRAVENOUS

## 2018-03-27 MED ORDER — SODIUM CHLORIDE 0.9 % IV SOLN: INTRAVENOUS | Status: DC

## 2018-03-27 NOTE — Transfer of Care (Signed)
Immediate Anesthesia Transfer of Care Note  Patient: KAYDI KLEY  Procedure(s) Performed: ENTEROSCOPY (N/A ) HOT HEMOSTASIS (ARGON PLASMA COAGULATION/BICAP) (N/A )  Patient Location: PACU and Endoscopy Unit  Anesthesia Type:MAC  Level of Consciousness: awake and drowsy  Airway & Oxygen Therapy: Patient Spontanous Breathing and Patient connected to nasal cannula oxygen  Post-op Assessment: Report given to RN and Post -op Vital signs reviewed and stable  Post vital signs: Reviewed and stable  Last Vitals:  Vitals Value Taken Time  BP 160/75 03/27/2018  2:14 PM  Temp    Pulse 74 03/27/2018  2:16 PM  Resp 30 03/27/2018  2:16 PM  SpO2 97 % 03/27/2018  2:16 PM  Vitals shown include unvalidated device data.  Last Pain:  Vitals:   03/27/18 1255  TempSrc: Oral  PainSc: 0-No pain         Complications: No apparent anesthesia complications

## 2018-03-27 NOTE — H&P (Signed)
[name] HPI: The patient complained about having black stools again after her follow up visit on 03/04/2018. She has a history of D2 and D3 AVMs and one area was noted to be bleeding. She reports feeling better and she thinks that her black stools may be from her iron supplementation.    Past Medical History:  Diagnosis Date  . Anemia 12/26/2015  . Anxiety   . Arthritis   . Blood transfusion without reported diagnosis   . Chronic pain   . COPD (chronic obstructive pulmonary disease) ([place])   . Deep vein blood clot of right lower extremity ([place]) 12/29/2012   Dr. Nadyne Coombes, stent in place  . Depression   . Diabetes mellitus without complication ([place])   . GERD (gastroesophageal reflux disease)   . Heart murmur   . Hyperlipemia   . Hypertension   . Sleep apnea    uses a cpap  . Wears glasses     Past Surgical History:  Procedure Laterality Date  . ABDOMINAL HYSTERECTOMY    . CARDIAC CATHETERIZATION  4/15  . CATARACT EXTRACTION    . CESAREAN SECTION    . COLONOSCOPY N/A 01/08/2013   Procedure: COLONOSCOPY;  Surgeon: Beryle Beams, MD;  Location: [facility] ENDOSCOPY;  Service: Endoscopy;  Laterality: N/A;  . COLONOSCOPY N/A 12/28/2015   Procedure: COLONOSCOPY;  Surgeon: Carol Ada, MD;  Location: [facility] ENDOSCOPY;  Service: Endoscopy;  Laterality: N/A;  . ENTEROSCOPY N/A 12/28/2015   Procedure: ENTEROSCOPY;  Surgeon: Carol Ada, MD;  Location: [place];  Service: Endoscopy;  Laterality: N/A;  . ENTEROSCOPY N/A 02/20/2018   Procedure: ENTEROSCOPY;  Surgeon: Carol Ada, MD;  Location: [facility] ENDOSCOPY;  Service: Endoscopy;  Laterality: N/A;  . ESOPHAGOGASTRODUODENOSCOPY N/A 01/08/2013   Procedure: ESOPHAGOGASTRODUODENOSCOPY (EGD);  Surgeon: Beryle Beams, MD;  Location: [facility] ENDOSCOPY;  Service: Endoscopy;  Laterality: N/A;  . EYE SURGERY    . HAND SURGERY    . HOT HEMOSTASIS N/A 12/28/2015   Procedure: HOT HEMOSTASIS (ARGON PLASMA COAGULATION/BICAP);  Surgeon: Carol Ada, MD;  Location:  [facility] ENDOSCOPY;  Service: Endoscopy;  Laterality: N/A;  . HOT HEMOSTASIS N/A 02/20/2018   Procedure: HOT HEMOSTASIS (ARGON PLASMA COAGULATION/BICAP);  Surgeon: Carol Ada, MD;  Location: [facility] ENDOSCOPY;  Service: Endoscopy;  Laterality: N/A;  . LEFT HEART CATHETERIZATION WITH CORONARY ANGIOGRAM N/A 03/09/2013   Procedure: LEFT HEART CATHETERIZATION WITH CORONARY ANGIOGRAM;  Surgeon: Laverda Page, MD;  Location: [facility] CATH LAB;  Service: Cardiovascular;  Laterality: N/A;  . LOWER EXTREMITY ANGIOGRAM N/A 12/29/2012   Procedure: LOWER EXTREMITY ANGIOGRAM;  Surgeon: Laverda Page, MD;  Location: [facility] CATH LAB;  Service: Cardiovascular;  Laterality: N/A;  . LOWER EXTREMITY ANGIOGRAM N/A 10/05/2013   Procedure: LOWER EXTREMITY ANGIOGRAM;  Surgeon: Laverda Page, MD;  Location: [facility] CATH LAB;  Service: Cardiovascular;  Laterality: N/A;  . LOWER EXTREMITY ANGIOGRAPHY N/A 02/11/2017   Procedure: Lower Extremity Angiography;  Surgeon: Adrian Prows, MD;  Location: [place] CV LAB;  Service: Cardiovascular;  Laterality: N/A;  . PERIPHERAL VASCULAR BALLOON ANGIOPLASTY  02/11/2017   Procedure: PERIPHERAL VASCULAR BALLOON ANGIOPLASTY;  Surgeon: Adrian Prows, MD;  Location: [place] CV LAB;  Service: Cardiovascular;;  Right SFA  . PERIPHERAL VASCULAR INTERVENTION Right 02/11/2017   Procedure: PERIPHERAL VASCULAR INTERVENTION;  Surgeon: Adrian Prows, MD;  Location: [place] CV LAB;  Service: Cardiovascular;  Laterality: Right;  Rt SFA  . stent in right leg  12/29/2012   for blood clot, Dr. Nadyne Coombes  . TYMPANOMASTOIDECTOMY Left  03/08/2014   Procedure: TYMPANOMASTOIDECTOMY LEFT;  Surgeon: Ascencion Dike, MD;  Location: [place];  Service: ENT;  Laterality: Left;  . TYMPANOMASTOIDECTOMY  2015  . UTERINE FIBROID SURGERY      Family History  Problem Relation Age of Onset  . Diabetes Mother   . Hypertension Mother   . Heart disease Mother   . Heart disease Father   . Hypertension Father     Social  History:  reports that she has been smoking cigarettes. She has a 10.25 pack-year smoking history. She has never used smokeless tobacco. She reports that she has current or past drug history. Drug: Marijuana. She reports that she does not drink alcohol.  Allergies: No Known Allergies  Medications:  Scheduled:  Continuous: . lactated ringers 1,000 mL (03/27/18 1312)    Results for orders placed or performed during the hospital encounter of 03/27/18 (from the past 24 hour(s))  Glucose, capillary     Status: None   Collection Time: 03/27/18  1:09 PM  Result Value Ref Range   Glucose-Capillary 78 70 - 99 mg/dL     No results found.  ROS:  As stated above in the HPI otherwise negative.  Blood pressure (!) 175/62, pulse 65, temperature 98.2 F (36.8 C), temperature source Oral, resp. rate 19, height 5' (1.524 m), weight 94.3 kg, SpO2 97 %.    PE: Gen: NAD, Alert and Oriented HEENT:  [place]/AT, EOMI Neck: Supple, no LAD Lungs: CTA Bilaterally CV: RRR without M/G/R ABM: Soft, NTND, +BS Ext: No C/C/E  Assessment/Plan: 1) Melena. 2) Heme positive stool. 3) History of AVMs.  Plan: 1) Enteroscopy with APC.  Rueben Kassim D 03/27/2018, 1:36 PM

## 2018-03-27 NOTE — Anesthesia Preprocedure Evaluation (Addendum)
Anesthesia Evaluation  Patient identified by MRN, date of birth, ID band Patient awake    Reviewed: Allergy & Precautions, NPO status , Patient's Chart, lab work & pertinent test results  Airway Mallampati: II  TM Distance: >3 FB Neck ROM: Full    Dental  (+) Dental Advisory Given, Partial Lower, Partial Upper   Pulmonary sleep apnea and Continuous Positive Airway Pressure Ventilation , COPD, Current Smoker (+Smoked on DOS),    Pulmonary exam normal breath sounds clear to auscultation       Cardiovascular hypertension, Pt. on medications Normal cardiovascular exam Rhythm:Regular Rate:Normal  Echo 08/05/11: Study Conclusions  - Left ventricle: The cavity size was normal. Wall thicknesswas normal. Systolic function was normal. The estimatedejection fraction was in the range of 55% to 65%. Wallmotion was normal; there were no regional wall motionabnormalities. Left ventricular diastolic functionparameters were normal. Tissue doppler is in the indeterminant range to assess LA pressure. - Aortic valve: Trileaflet; mildly thickened, mildly calcified leaflets. Sclerosis without stenosis. - Mitral valve: Mildly calcified annulus. Mildly thickened leaflets . - Right ventricle: Systolic pressure was increased. - Atrial septum: No defect or patent foramen ovale was identified. - Pulmonary arteries: PA peak pressure: 50m Hg (S).   Neuro/Psych PSYCHIATRIC DISORDERS Anxiety Depression negative neurological ROS     GI/Hepatic Neg liver ROS, GERD  ,Melena    Endo/Other  diabetes, Type 2, Oral Hypoglycemic AgentsMorbid obesity  Renal/GU negative Renal ROS     Musculoskeletal  (+) Arthritis ,   Abdominal   Peds  Hematology  (+) Blood dyscrasia (Plavix), anemia ,   Anesthesia Other Findings Day of surgery medications reviewed with the patient.  Reproductive/Obstetrics                            Anesthesia Physical Anesthesia Plan  ASA: III  Anesthesia Plan: MAC   Post-op Pain Management:    Induction: Intravenous  PONV Risk Score and Plan: 1 and Treatment may vary due to age or medical condition and Propofol infusion  Airway Management Planned: Natural Airway and Simple Face Mask  Additional Equipment:   Intra-op Plan:   Post-operative Plan:   Informed Consent: I have reviewed the patients History and Physical, chart, labs and discussed the procedure including the risks, benefits and alternatives for the proposed anesthesia with the patient or authorized representative who has indicated his/her understanding and acceptance.   Dental advisory given  Plan Discussed with: CRNA and Anesthesiologist  Anesthesia Plan Comments:         Anesthesia Quick Evaluation

## 2018-03-27 NOTE — Op Note (Signed)
[facility] Patient Name: [name] Procedure Date: 03/27/2018 MRN: [id number] Attending MD: Carol Ada , MD Date of Birth: 02-07-1951 CSN: [id number] Age: 67 Admit Type: Outpatient Procedure:                Small bowel enteroscopy Indications:              Melena, Occult blood in stool Providers:                Carol Ada, MD, Cleda Daub, RN, Charolette Child,                            Technician, Stephanie [place] CRNA Referring MD:              Medicines:                Propofol per Anesthesia Complications:            No immediate complications. Estimated Blood Loss:     Estimated blood loss: none. Procedure:                Pre-Anesthesia Assessment:                           - Prior to the procedure, a History and Physical                            was performed, and patient medications and                            allergies were reviewed. The patient's tolerance of                            previous anesthesia was also reviewed. The risks                            and benefits of the procedure and the sedation                            options and risks were discussed with the patient.                            All questions were answered, and informed consent                            was obtained. Prior Anticoagulants: The patient has                            taken no previous anticoagulant or antiplatelet                            agents. ASA Grade Assessment: III - A patient with                            severe systemic disease. After reviewing the risks  and benefits, the patient was deemed in                            satisfactory condition to undergo the procedure.                           - Sedation was administered by an anesthesia                            professional. Deep sedation was attained.                           After obtaining informed consent, the endoscope was   passed under direct vision. Throughout the                            procedure, the patient's blood pressure, pulse, and                            oxygen saturations were monitored continuously. The                            [id number]-[id number] ([id number] Olympus peds colonoscope was                            introduced through the mouth and advanced to the                            proximal jejunum. The small bowel enteroscopy was                            accomplished without difficulty. The patient                            tolerated the procedure well. Scope In: Scope Out: Findings:      The examined esophagus was normal.      A few dispersed, small non-bleeding erosions were found in the gastric       body. There were stigmata of recent bleeding in the form of hematin.      The examined duodenum was normal.      Two angiodysplastic lesions with no bleeding were found in the proximal       jejunum. Coagulation for tissue destruction using monopolar probe was       successful. Estimated blood loss: none. Impression:               - Normal esophagus.                           - Non-bleeding erosive gastropathy.                           - Normal examined duodenum.                           - Two non-bleeding angiodysplastic lesions in the  duodenum. Treated with a monopolar probe.                           - No specimens collected. Recommendation:           - Patient has a contact number available for                            emergencies. The signs and symptoms of potential                            delayed complications were discussed with the                            patient. Return to normal activities tomorrow.                            Written discharge instructions were provided to the                            patient.                           - Resume previous diet.                           - PPI QD.                           - Follow up in  the office in two weeks. Procedure Code(s):        --- Professional ---                           [phone], Small intestinal endoscopy, enteroscopy                            beyond second portion of duodenum, not including                            ileum; with ablation of tumor(s), polyp(s), or                            other lesion(s) not amenable to removal by hot                            biopsy forceps, bipolar cautery or snare technique Diagnosis Code(s):        --- Professional ---                           K31.89, Other diseases of stomach and duodenum                           K31.819, Angiodysplasia of stomach and duodenum                            without bleeding  K92.1, Melena (includes Hematochezia)                           R19.5, Other fecal abnormalities CPT copyright 2017 American Medical Association. All rights reserved. The codes documented in this report are preliminary and upon coder review may  be revised to meet current compliance requirements. Carol Ada, MD Carol Ada, MD 03/27/2018 2:11:33 PM This report has been signed electronically. Number of Addenda: 0

## 2018-03-27 NOTE — Discharge Instructions (Signed)

## 2018-03-27 NOTE — Anesthesia Postprocedure Evaluation (Signed)
Anesthesia Post Note  Patient: [name]  Procedure(s) Performed: ENTEROSCOPY (N/A ) HOT HEMOSTASIS (ARGON PLASMA COAGULATION/BICAP) (N/A )     Anesthesia Post Evaluation  Last Vitals:  Vitals:   03/27/18 1415 03/27/18 1420  BP: (!) 160/75 (!) 169/80  Pulse: 74 74  Resp: (!) 30 (!) 28  Temp: 36.5 C   SpO2: 97% 99%    Last Pain:  Vitals:   03/27/18 1420  TempSrc:   PainSc: 0-No pain                 Catalina Gravel

## 2018-03-30 ENCOUNTER — Encounter (HOSPITAL_COMMUNITY): Payer: Self-pay | Admitting: Gastroenterology

## 2018-04-09 ENCOUNTER — Encounter: Payer: Self-pay | Admitting: Oncology

## 2018-04-09 ENCOUNTER — Inpatient Hospital Stay: Payer: Medicare Other

## 2018-04-09 ENCOUNTER — Telehealth: Payer: Self-pay

## 2018-04-09 ENCOUNTER — Inpatient Hospital Stay: Payer: Medicare Other | Attending: Internal Medicine | Admitting: Oncology

## 2018-04-09 VITALS — BP 171/78 | HR 63 | Temp 98.1°F | Resp 20 | Ht 60.0 in | Wt 208.4 lb

## 2018-04-09 DIAGNOSIS — J449 Chronic obstructive pulmonary disease, unspecified: Secondary | ICD-10-CM | POA: Diagnosis not present

## 2018-04-09 DIAGNOSIS — K219 Gastro-esophageal reflux disease without esophagitis: Secondary | ICD-10-CM

## 2018-04-09 DIAGNOSIS — Z7982 Long term (current) use of aspirin: Secondary | ICD-10-CM

## 2018-04-09 DIAGNOSIS — I10 Essential (primary) hypertension: Secondary | ICD-10-CM | POA: Diagnosis not present

## 2018-04-09 DIAGNOSIS — D649 Anemia, unspecified: Secondary | ICD-10-CM

## 2018-04-09 DIAGNOSIS — Z79899 Other long term (current) drug therapy: Secondary | ICD-10-CM | POA: Diagnosis not present

## 2018-04-09 DIAGNOSIS — K5521 Angiodysplasia of colon with hemorrhage: Secondary | ICD-10-CM

## 2018-04-09 DIAGNOSIS — R011 Cardiac murmur, unspecified: Secondary | ICD-10-CM | POA: Diagnosis not present

## 2018-04-09 DIAGNOSIS — D509 Iron deficiency anemia, unspecified: Secondary | ICD-10-CM

## 2018-04-09 DIAGNOSIS — E119 Type 2 diabetes mellitus without complications: Secondary | ICD-10-CM | POA: Diagnosis not present

## 2018-04-09 DIAGNOSIS — G4730 Sleep apnea, unspecified: Secondary | ICD-10-CM

## 2018-04-09 DIAGNOSIS — E785 Hyperlipidemia, unspecified: Secondary | ICD-10-CM | POA: Diagnosis not present

## 2018-04-09 DIAGNOSIS — D500 Iron deficiency anemia secondary to blood loss (chronic): Secondary | ICD-10-CM | POA: Diagnosis not present

## 2018-04-09 DIAGNOSIS — F418 Other specified anxiety disorders: Secondary | ICD-10-CM

## 2018-04-09 DIAGNOSIS — M129 Arthropathy, unspecified: Secondary | ICD-10-CM

## 2018-04-09 DIAGNOSIS — Z7984 Long term (current) use of oral hypoglycemic drugs: Secondary | ICD-10-CM

## 2018-04-09 LAB — CBC WITH DIFFERENTIAL (CANCER CENTER ONLY)
ABS IMMATURE GRANULOCYTES: 0.02 10*3/uL (ref 0.00–0.07)
BASOS ABS: 0 10*3/uL (ref 0.0–0.1)
BASOS PCT: 1 %
EOS ABS: 0.2 10*3/uL (ref 0.0–0.5)
Eosinophils Relative: 3 %
HCT: 41.4 % (ref 36.0–46.0)
Hemoglobin: 12.3 g/dL (ref 12.0–15.0)
Immature Granulocytes: 0 %
Lymphocytes Relative: 29 %
Lymphs Abs: 1.9 10*3/uL (ref 0.7–4.0)
MCH: 24.7 pg — ABNORMAL LOW (ref 26.0–34.0)
MCHC: 29.7 g/dL — ABNORMAL LOW (ref 30.0–36.0)
MCV: 83.1 fL (ref 80.0–100.0)
MONOS PCT: 8 %
Monocytes Absolute: 0.5 10*3/uL (ref 0.1–1.0)
NEUTROS ABS: 3.9 10*3/uL (ref 1.7–7.7)
NEUTROS PCT: 59 %
NRBC: 0 % (ref 0.0–0.2)
PLATELETS: 404 10*3/uL — AB (ref 150–400)
RBC: 4.98 MIL/uL (ref 3.87–5.11)
RDW: 25.9 % — AB (ref 11.5–15.5)
WBC: 6.5 10*3/uL (ref 4.0–10.5)

## 2018-04-09 LAB — FERRITIN: Ferritin: 76 ng/mL (ref 11–307)

## 2018-04-09 LAB — IRON AND TIBC
Iron: 29 ug/dL — ABNORMAL LOW (ref 41–142)
Saturation Ratios: 9 % — ABNORMAL LOW (ref 21–57)
TIBC: 325 ug/dL (ref 236–444)
UIBC: 297 ug/dL

## 2018-04-09 NOTE — Progress Notes (Signed)
[facility] OFFICE PROGRESS NOTE  Benito Mccreedy, MD [address] [place] [address]  DIAGNOSIS: Iron deficiency anemia.  PRIOR THERAPY: 2 units of packed red blood cells on 02/14/2018.  She also received Feraheme 510 mg IV on 02/27/2018 and 03/06/2018.  CURRENT THERAPY: Integra Plus 1 tablet daily.  INTERVAL HISTORY: [name] 67 y.o. female returns for routine follow-up visit by herself.  The patient is feeling fine today and has no specific complaints.  She reports that her dizziness, fatigue, and shortness of breath with exertion have resolved.  She denies fevers and chills.  Denies chest pain, shortness of breath, cough, hemoptysis.  Denies nausea, vomiting, constipation, diarrhea.  Denies recent weight loss or night sweats.  Denies bleeding.  The patient had a recent endoscopy by Dr. Benson Norway.  There were 2 small areas of bleeding noted which were treated with a monopolar probe.  The patient is here for evaluation and repeat lab work.  MEDICAL HISTORY: Past Medical History:  Diagnosis Date  . Anemia 12/26/2015  . Anxiety   . Arthritis   . Blood transfusion without reported diagnosis   . Chronic pain   . COPD (chronic obstructive pulmonary disease) ([place])   . Deep vein blood clot of right lower extremity ([place]) 12/29/2012   Dr. Nadyne Coombes, stent in place  . Depression   . Diabetes mellitus without complication ([place])   . GERD (gastroesophageal reflux disease)   . Heart murmur   . Hyperlipemia   . Hypertension   . Sleep apnea    uses a cpap  . Wears glasses     ALLERGIES:  has No Known Allergies.  MEDICATIONS:  Current Outpatient Medications  Medication Sig Dispense Refill  . albuterol (PROVENTIL HFA;VENTOLIN HFA) 108 (90 Base) MCG/ACT inhaler Inhale 1-2 puffs into the lungs every 6 (six) hours as needed for wheezing or shortness of breath.    Marland Kitchen aspirin EC 81 MG tablet Take 81 mg by mouth 3 (three) times a week.     Marland Kitchen atorvastatin (LIPITOR) 40  MG tablet Take 40 mg by mouth daily.    . benzonatate (TESSALON) 100 MG capsule Take 1 capsule (100 mg total) by mouth every 8 (eight) hours. 21 capsule 0  . Blood Glucose Monitoring Suppl (ACCU-CHEK NANO SMARTVIEW) w/Device KIT See admin instructions.  0  . clopidogrel (PLAVIX) 75 MG tablet Take 1 tablet (75 mg total) by mouth daily with breakfast.    . FeFum-FePoly-FA-B Cmp-C-Biot (INTEGRA PLUS) CAPS Take 1 capsule by mouth daily. 30 capsule 2  . FEROSUL 325 (65 Fe) MG tablet Take 1 tablet by mouth 3 (three) times daily.  0  . gabapentin (NEURONTIN) 600 MG tablet Take 1 tablet (600 mg total) by mouth at bedtime. 30 tablet 11  . hydrALAZINE (APRESOLINE) 50 MG tablet Take 50 mg by mouth 3 (three) times daily.     . metFORMIN (GLUCOPHAGE) 500 MG tablet Take 0.5 tablets (250 mg total) by mouth 2 (two) times daily with a meal.    . nitroGLYCERIN (NITROSTAT) 0.4 MG SL tablet Place 0.4 mg under the tongue every 5 (five) minutes as needed for chest pain.    Marland Kitchen omeprazole (PRILOSEC) 40 MG capsule Take 1 capsule by mouth daily.  5  . spironolactone (ALDACTONE) 25 MG tablet Take 25 mg by mouth daily.     No current facility-administered medications for this visit.     SURGICAL HISTORY:  Past Surgical History:  Procedure Laterality Date  .  ABDOMINAL HYSTERECTOMY    . CARDIAC CATHETERIZATION  4/15  . CATARACT EXTRACTION    . CESAREAN SECTION    . COLONOSCOPY N/A 01/08/2013   Procedure: COLONOSCOPY;  Surgeon: Beryle Beams, MD;  Location: [facility] END[facility]COPY;  Service: Endoscopy;  Laterality: N/A;  . COLONOSCOPY N/A 12/28/2015   Procedure: COLONOSCOPY;  Surgeon: Carol Ada, MD;  Location: [facility] ENDOSCOPY;  Service: Endoscopy;  Laterality: N/A;  . ENTEROSCOPY N/A 12/28/2015   Procedure: ENTEROSCOPY;  Surgeon: Carol Ada, MD;  Location: [facility];  Service: Endoscopy;  Laterality: N/A;  . ENTEROSCOPY N/A 02/20/2018   Procedure: ENTEROSCOPY;  Surgeon: Carol Ada, MD;  Location: [facility];  Service:  Endoscopy;  Laterality: N/A;  . ENTEROSCOPY N/A 03/27/2018   Procedure: ENTEROSCOPY;  Surgeon: Carol Ada, MD;  Location: [facility];  Service: Endoscopy;  Laterality: N/A;  . ESOPHAGOGASTRODUODENOSCOPY N/A 01/08/2013   Procedure: ESOPHAGOGASTRODUODENOSCOPY (EGD);  Surgeon: Beryle Beams, MD;  Location: [facility];  Service: Endoscopy;  Laterality: N/A;  . EYE SURGERY    . HAND SURGERY    . HOT HEMOSTASIS N/A 12/28/2015   Procedure: HOT HEMOSTASIS (ARGON PLASMA COAGULATION/BICAP);  Surgeon: Carol Ada, MD;  Location: [facility] ENDOSCOPY;  Service: Endoscopy;  Laterality: N/A;  . HOT HEMOSTASIS N/A 02/20/2018   Procedure: HOT HEMOSTASIS (ARGON PLASMA COAGULATION/BICAP);  Surgeon: Carol Ada, MD;  Location: [facility]OS[facility];  Service: Endoscopy;  Laterality: N/A;  . HOT HEMOSTASIS N/A 03/27/2018   Procedure: HOT HEMOSTASIS (ARGON PLASMA COAGULATION/BICAP);  Surgeon: Carol Ada, MD;  Location: [facility];  Service: Endoscopy;  Laterality: N/A;  . LEFT HEART CATHETERIZATION WITH CORONARY ANGIOGRAM N/A 03/09/2013   Procedure: LEFT HEART CATHETERIZATION WITH CORONARY ANGIOGRAM;  Surgeon: Laverda Page, MD;  Location: [facility] CATH LAB;  Service: Cardiovascular;  Laterality: N/A;  . LOWER EXTREMITY ANGIOGRAM N/A 12/29/2012   Procedure: LOWER EXTREMITY ANGIOGRAM;  Surgeon: Laverda Page, MD;  Location: [facility] CATH LAB;  Service: Cardiovascular;  Laterality: N/A;  . LOWER EXTREMITY ANGIOGRAM N/A 10/05/2013   Procedure: LOWER EXTREMITY ANGIOGRAM;  Surgeon: Laverda Page, MD;  Location[facility] CATH LAB;  Service: Cardiovascular;  Laterality: N/A;  . LOWER EXTREMITY ANGIOGRAPHY N/A 02/11/2017   Procedure: Lower Extremity Angiography;  Surgeon: Adrian Prows, MD;  Location: [facility] CV LAB;  Service: Cardiovascular;  Laterality: N/A;  . PERIPHERAL VASCULAR BALLOON ANGIOPLASTY  02/11/2017   Procedure: PERIPHERAL VASCULAR BALLOON ANGIOPLASTY;  Surgeon: Adrian Prows, MD;  Location: [facility] CV LAB;  Service:  Cardiovascular;;  Right SFA  . PERIPHERAL VASCULAR INTERVENTION Right 02/11/2017   Procedure: PERIPHERAL VASCULAR INTERVENTION;  Surgeon: Adrian Prows, MD;  Location: [facility] CV LAB;  Service: Cardiovascular;  Laterality: Right;  Rt SFA  . stent in right leg  12/29/2012   for blood clot, Dr. Nadyne Coombes  . TYMPANOMASTOIDECTOMY Left 03/08/2014   Procedure: TYMPANOMASTOIDECTOMY LEFT;  Surgeon: Ascencion Dike, MD;  Location: [facility];  Service: ENT;  Laterality: Left;  . TYMPANOMASTOIDECTOMY  2015  . UTERINE FIBROID SURGERY      REVIEW OF SYSTEMS:   Review of Systems  Constitutional: Negative for appetite change, chills, fatigue, fever and unexpected weight change.  HENT:   Negative for mouth sores, nosebleeds, sore throat and trouble swallowing.   Eyes: Negative for eye problems and icterus.  Respiratory: Negative for cough, hemoptysis, shortness of breath and wheezing.   Cardiovascular: Negative for chest pain and leg swelling.  Gastrointestinal: Negative for abdominal pain, constipation, diarrhea, nausea and vomiting.  Genitourinary: Negative for bladder  incontinence, difficulty urinating, dysuria, frequency and hematuria.   Musculoskeletal: Negative for back pain, gait problem, neck pain and neck stiffness.  Skin: Negative for itching and rash.  Neurological: Negative for dizziness, extremity weakness, gait problem, headaches, light-headedness and seizures.  Hematological: Negative for adenopathy. Does not bruise/bleed easily.  Psychiatric/Behavioral: Negative for confusion, depression and sleep disturbance. The patient is not nervous/anxious.     PHYSICAL EXAMINATION:  Blood pressure (!) 171/78, pulse 63, temperature 98.1 F (36.7 C), temperature source Oral, resp. rate 20, height 5' (1.524 m), weight 208 lb 6.4 oz (94.5 kg), SpO2 98 %.  ECOG PERFORMANCE STATUS: 0 - Asymptomatic  Physical Exam  Constitutional: Oriented to person, place, and time and well-developed,  well-nourished, and in no distress. No distress.  HENT:  Head: Normocephalic and atraumatic.  Mouth/Throat: Oropharynx is clear and moist. No oropharyngeal exudate.  Eyes: Conjunctivae are normal. Right eye exhibits no discharge. Left eye exhibits no discharge. No scleral icterus.  Neck: Normal range of motion. Neck supple.  Cardiovascular: Normal rate, regular rhythm, normal heart sounds and intact distal pulses.   Pulmonary/Chest: Effort normal and breath sounds normal. No respiratory distress. No wheezes. No rales.  Abdominal: Soft. Bowel sounds are normal. Exhibits no distension and no mass. There is no tenderness.  Musculoskeletal: Normal range of motion. Exhibits no edema.  Lymphadenopathy:    No cervical adenopathy.  Neurological: Alert and oriented to person, place, and time. Exhibits normal muscle tone. Gait normal. Coordination normal.  Skin: Skin is warm and dry. No rash noted. Not diaphoretic. No erythema. No pallor.  Psychiatric: Mood, memory and judgment normal.  Vitals reviewed.  LABORATORY DATA: Lab Results  Component Value Date   WBC 6.5 04/09/2018   HGB 12.3 04/09/2018   HCT 41.4 04/09/2018   MCV 83.1 04/09/2018   PLT 404 (H) 04/09/2018      Chemistry      Component Value Date/Time   NA 142 03/05/2018 0831   K 3.4 (L) 03/05/2018 0831   CL 108 03/05/2018 0831   CO2 25 03/05/2018 0831   BUN 9 03/05/2018 0831   CREATININE 0.90 03/05/2018 0831      Component Value Date/Time   CALCIUM 9.0 03/05/2018 0831   ALKPHOS 64 03/05/2018 0831   AST 20 03/05/2018 0831   ALT 10 03/05/2018 0831   BILITOT 0.5 03/05/2018 0831       RADIOGRAPHIC STUDIES:  No results found.   ASSESSMENT/PLAN:  Iron deficiency anemia This is a very pleasant 67 year old African-American female presented with severe iron deficiency anemia due to bleeding AVMs.  She underwent a recent upper endoscopy. She is here for evaluation and repeat lab work.  CBC from today was reviewed with the  patient.  Her hemoglobin has now normalized.  Iron studies and ferritin are pending.  Recommend that she continue on oral iron.  We will contact her with the results of the iron studies and ferritin if needed.  We will see her back in 6 weeks for evaluation and repeat lab work.  For the AVMs, she was encouraged to keep her follow-up with Dr. Benson Norway.  For hypertension, I have recommended that she take her blood pressure medication on a regular basis and follow-up with her primary care provider for adjustment of her medication.  She was advised to call immediately if she has any concerning symptoms in the interval. The patient voices understanding of current disease status and treatment options and is in agreement with the current care plan.  All questions were answered. The patient knows to call the clinic with any problems, questions or concerns. We can certainly see the patient much sooner if necessary.    Orders Placed This Encounter  Procedures  . CBC with Differential (Cancer Center Only)    Standing Status:   Future    Standing Expiration Date:   04/10/2019  . Ferritin    Standing Status:   Future    Standing Expiration Date:   04/10/2019  . Iron and TIBC    Standing Status:   Future    Standing Expiration Date:   04/10/2019     Mikey Bussing, DNP, AGPCNP-BC, AOCNP 04/09/18

## 2018-04-09 NOTE — Telephone Encounter (Signed)
Printed avs and calender of upcoming appointment. Per 10/17 los

## 2018-04-09 NOTE — Assessment & Plan Note (Signed)
This is a very pleasant 67 year old African-American female presented with severe iron deficiency anemia due to bleeding AVMs.  She underwent a recent upper endoscopy. She is here for evaluation and repeat lab work.  CBC from today was reviewed with the patient.  Her hemoglobin has now normalized.  Iron studies and ferritin are pending.  Recommend that she continue on oral iron.  We will contact her with the results of the iron studies and ferritin if needed.  We will see her back in 6 weeks for evaluation and repeat lab work.  For the AVMs, she was encouraged to keep her follow-up with Dr. Benson Norway.  For hypertension, I have recommended that she take her blood pressure medication on a regular basis and follow-up with her primary care provider for adjustment of her medication.  She was advised to call immediately if she has any concerning symptoms in the interval. The patient voices understanding of current disease status and treatment options and is in agreement with the current care plan.  All questions were answered. The patient knows to call the clinic with any problems, questions or concerns. We can certainly see the patient much sooner if necessary.

## 2018-04-10 ENCOUNTER — Telehealth: Payer: Self-pay | Admitting: *Deleted

## 2018-04-10 NOTE — Telephone Encounter (Signed)
Left message with note below. To call if has any questions

## 2018-04-10 NOTE — Telephone Encounter (Signed)
-----   Message from Carla Shape, NP sent at 04/10/2018  9:01 AM EDT ----- Please call the patient and let her know that her ferritin has improved and is now normal.  Recommend that she continue her oral iron as she is currently taking.  Keep follow-up in approximately 6 weeks as we discussed yesterday.

## 2018-04-13 DIAGNOSIS — K921 Melena: Secondary | ICD-10-CM | POA: Diagnosis not present

## 2018-04-13 DIAGNOSIS — K31819 Angiodysplasia of stomach and duodenum without bleeding: Secondary | ICD-10-CM | POA: Diagnosis not present

## 2018-04-13 DIAGNOSIS — K3189 Other diseases of stomach and duodenum: Secondary | ICD-10-CM | POA: Diagnosis not present

## 2018-04-15 DIAGNOSIS — I739 Peripheral vascular disease, unspecified: Secondary | ICD-10-CM | POA: Diagnosis not present

## 2018-04-17 ENCOUNTER — Other Ambulatory Visit: Payer: Self-pay

## 2018-04-17 NOTE — Patient Outreach (Signed)
[name] Care Management  04/17/2018  [name] 01-21-1951 [id number]   Medication Adherence call to [name] left a message for patient to call back patient is due on Losartan 50 mg under [facility].   [facility] Management Direct Dial [phone]  Fax [phone] [email or web address]

## 2018-04-23 DIAGNOSIS — J449 Chronic obstructive pulmonary disease, unspecified: Secondary | ICD-10-CM | POA: Diagnosis not present

## 2018-04-23 DIAGNOSIS — E1140 Type 2 diabetes mellitus with diabetic neuropathy, unspecified: Secondary | ICD-10-CM | POA: Diagnosis not present

## 2018-04-23 DIAGNOSIS — I10 Essential (primary) hypertension: Secondary | ICD-10-CM | POA: Diagnosis not present

## 2018-04-23 DIAGNOSIS — Z0001 Encounter for general adult medical examination with abnormal findings: Secondary | ICD-10-CM | POA: Diagnosis not present

## 2018-05-07 DIAGNOSIS — J449 Chronic obstructive pulmonary disease, unspecified: Secondary | ICD-10-CM | POA: Diagnosis not present

## 2018-05-07 DIAGNOSIS — E1140 Type 2 diabetes mellitus with diabetic neuropathy, unspecified: Secondary | ICD-10-CM | POA: Diagnosis not present

## 2018-05-07 DIAGNOSIS — R05 Cough: Secondary | ICD-10-CM | POA: Diagnosis not present

## 2018-05-07 DIAGNOSIS — E785 Hyperlipidemia, unspecified: Secondary | ICD-10-CM | POA: Diagnosis not present

## 2018-05-25 ENCOUNTER — Inpatient Hospital Stay: Payer: Medicare Other | Admitting: Internal Medicine

## 2018-05-25 ENCOUNTER — Inpatient Hospital Stay: Payer: Medicare Other | Attending: Internal Medicine

## 2018-05-26 DIAGNOSIS — E1140 Type 2 diabetes mellitus with diabetic neuropathy, unspecified: Secondary | ICD-10-CM | POA: Diagnosis not present

## 2018-05-26 DIAGNOSIS — J449 Chronic obstructive pulmonary disease, unspecified: Secondary | ICD-10-CM | POA: Diagnosis not present

## 2018-05-26 DIAGNOSIS — G44219 Episodic tension-type headache, not intractable: Secondary | ICD-10-CM | POA: Diagnosis not present

## 2018-06-01 ENCOUNTER — Other Ambulatory Visit: Payer: Self-pay

## 2018-06-01 ENCOUNTER — Emergency Department (HOSPITAL_COMMUNITY)
Admission: EM | Admit: 2018-06-01 | Discharge: 2018-06-01 | Disposition: A | Discharge status: Home / Self Care | Payer: Medicare Other | Attending: Emergency Medicine | Admitting: Emergency Medicine

## 2018-06-01 ENCOUNTER — Emergency Department (HOSPITAL_COMMUNITY): Payer: Medicare Other

## 2018-06-01 DIAGNOSIS — R52 Pain, unspecified: Secondary | ICD-10-CM | POA: Diagnosis not present

## 2018-06-01 DIAGNOSIS — R29818 Other symptoms and signs involving the nervous system: Secondary | ICD-10-CM | POA: Diagnosis not present

## 2018-06-01 DIAGNOSIS — G4489 Other headache syndrome: Secondary | ICD-10-CM | POA: Diagnosis not present

## 2018-06-01 DIAGNOSIS — R519 Headache, unspecified: Secondary | ICD-10-CM

## 2018-06-01 DIAGNOSIS — I10 Essential (primary) hypertension: Secondary | ICD-10-CM | POA: Insufficient documentation

## 2018-06-01 DIAGNOSIS — R51 Headache: Secondary | ICD-10-CM | POA: Diagnosis not present

## 2018-06-01 DIAGNOSIS — I159 Secondary hypertension, unspecified: Secondary | ICD-10-CM | POA: Diagnosis not present

## 2018-06-01 LAB — CBC WITH DIFFERENTIAL/PLATELET
Abs Immature Granulocytes: 0.01 10*3/uL (ref 0.00–0.07)
Basophils Absolute: 0 10*3/uL (ref 0.0–0.1)
Basophils Relative: 1 %
Eosinophils Absolute: 0.1 10*3/uL (ref 0.0–0.5)
Eosinophils Relative: 2 %
HCT: 46.7 % — ABNORMAL HIGH (ref 36.0–46.0)
Hemoglobin: 14 g/dL (ref 12.0–15.0)
Immature Granulocytes: 0 %
Lymphocytes Relative: 31 %
Lymphs Abs: 2.3 10*3/uL (ref 0.7–4.0)
MCH: 25.3 pg — ABNORMAL LOW (ref 26.0–34.0)
MCHC: 30 g/dL (ref 30.0–36.0)
MCV: 84.4 fL (ref 80.0–100.0)
Monocytes Absolute: 0.6 10*3/uL (ref 0.1–1.0)
Monocytes Relative: 8 %
Neutro Abs: 4.2 10*3/uL (ref 1.7–7.7)
Neutrophils Relative %: 58 %
Platelets: 418 10*3/uL — ABNORMAL HIGH (ref 150–400)
RBC: 5.53 MIL/uL — ABNORMAL HIGH (ref 3.87–5.11)
RDW: 17.2 % — ABNORMAL HIGH (ref 11.5–15.5)
WBC: 7.2 10*3/uL (ref 4.0–10.5)
nRBC: 0 % (ref 0.0–0.2)

## 2018-06-01 LAB — BASIC METABOLIC PANEL
Anion gap: 11 (ref 5–15)
BUN: 9 mg/dL (ref 8–23)
CO2: 28 mmol/L (ref 22–32)
Calcium: 9.7 mg/dL (ref 8.9–10.3)
Chloride: 99 mmol/L (ref 98–111)
Creatinine, Ser: 0.8 mg/dL (ref 0.44–1.00)
GFR calc Af Amer: >60 mL/min (ref >60)
GFR calc non Af Amer: >60 mL/min (ref >60)
Glucose, Bld: 93 mg/dL (ref 70–99)
Potassium: 3.8 mmol/L (ref 3.5–5.1)
Sodium: 138 mmol/L (ref 135–145)

## 2018-06-01 MED ORDER — ACETAMINOPHEN 325 MG PO TABS
650.0000 mg | ORAL_TABLET | Freq: Once | ORAL | Status: AC
  Administered 2018-06-01: 650 mg via ORAL
  Filled 2018-06-01: qty 2

## 2018-06-01 NOTE — ED Provider Notes (Signed)
  Face-to-face evaluation   History: Presents for evaluation of headache present for 2 weeks.  No nausea, vomiting, blurred vision or weakness.  Physical exam: Awake, alert, comfortable.  Heart regular rate and rhythm without murmur lungs clear to auscultation.  She is lucid.  There is no dysarthria or aphasia.  MDM-nonspecific headache, resolved with Tylenol, with ongoing hypertension.  Doubt hypertensive urgency or impending vascular collapse.  Medical screening examination/treatment/procedure(s) were conducted as a shared visit with non-physician practitioner(s) and myself.  I personally evaluated the patient during the encounter    Daleen Bo, MD 06/01/18 [phone]

## 2018-06-01 NOTE — Discharge Instructions (Addendum)
Please read attached information. If you experience any new or worsening signs or symptoms please return to the emergency room for evaluation. Please follow-up with your primary care provider or specialist as discussed.  °

## 2018-06-01 NOTE — ED Triage Notes (Signed)
Pt presents to ED for evaluation of headache x2 weeks. Has seen PCP and is supposed to be getting set up with neurologist but hasn't gotten an appt yet. Pt hypertensive, 204/110, PCP aware of HTN and didn't want to change/adjust meds. No other neuro symptoms.

## 2018-06-01 NOTE — ED Notes (Signed)
Patient verbalizes understanding of discharge instructions. Opportunity for questioning and answers were provided. Armband removed by staff, pt discharged from ED.  

## 2018-06-01 NOTE — ED Provider Notes (Signed)
[facility] EMERGENCY DEPARTMENT Provider Note   CSN: [id number] Arrival date & time: 06/01/18  0854     History   Chief Complaint Chief Complaint  Patient presents with  . Headache    HPI SHELMA EIBEN is a 67 y.o. female.  HPI    67 year old female presents today with complaints of headache.  Patient notes approximately 1.5 weeks ago she developed a frontal throbbing nagging headache.  She notes this was slow onset and persistent.  Patient denies any aggravating activities, she does note some light sensitivity.  Patient denies any fever or neck stiffness, denies any trauma to the head.  She denies any neurological deficits.  Patient reports similar headaches in the past although not this severe.  Patient has been seen by her primary care provider who recommended neurology follow-up but she has not followed up with neurologist.  Patient notes that she does take antihypertensive medication but these are not significantly improving her blood pressure as it remains elevated.  Patient also notes some nasal congestion and dry cough which started around the same time as the headache.  Patient notes she did take her blood pressure medication today.  Patient does note a history of anemia but denies any bleeding.    Past Medical History:  Diagnosis Date  . Anemia 12/26/2015  . Anxiety   . Arthritis   . Blood transfusion without reported diagnosis   . Chronic pain   . COPD (chronic obstructive pulmonary disease) ([place])   . Deep vein blood clot of right lower extremity ([place]) 12/29/2012   Dr. Nadyne Coombes, stent in place  . Depression   . Diabetes mellitus without complication ([place])   . GERD (gastroesophageal reflux disease)   . Heart murmur   . Hyperlipemia   . Hypertension   . Sleep apnea    uses a cpap  . Wears glasses     Patient Active Problem List   Diagnosis Date Noted  . Iron deficiency anemia 04/09/2018  . Symptomatic anemia 12/26/2015  . Hypokalemia  12/26/2015  . Type 2 diabetes mellitus with peripheral vascular disease ([place]) 12/26/2015  . Claudication in peripheral vascular disease ([place]) 10/05/2013  . Discomfort in chest 05/11/2012  . Nicotine dependence 05/11/2012  . Bradycardia 05/11/2012  . HTN (hypertension) 05/11/2012  . Back pain 05/11/2012    Past Surgical History:  Procedure Laterality Date  . ABDOMINAL HYSTERECTOMY    . CARDIAC CATHETERIZATION  4/15  . CATARACT EXTRACTION    . CESAREAN SECTION    . COLONOSCOPY N/A 01/08/2013   Procedure: COLONOSCOPY;  Surgeon: Beryle Beams, MD;  Location: [facility] ENDOSCOPY;  Service: Endoscopy;  Laterality: N/A;  . COLONOSCOPY N/A 12/28/2015   Procedure: COLONOSCOPY;  Surgeon: Carol Ada, MD;  Location: [facility] ENDOSCOPY;  Service: Endoscopy;  Laterality: N/A;  . ENTEROSCOPY N/A 12/28/2015   Procedure: ENTEROSCOPY;  Surgeon: Carol Ada, MD;  Location: [facility];  Service: Endoscopy;  Laterality: N/A;  . ENTEROSCOPY N/A 02/20/2018   Procedure: ENTEROSCOPY;  Surgeon: Carol Ada, MD;  Location: [facility] ENDOSCOPY;  Service: Endoscopy;  Laterality: N/A;  . ENTEROSCOPY N/A 03/27/2018   Procedure: ENTEROSCOPY;  Surgeon: Carol Ada, MD;  Location: [facility] ENDOSCOPY;  Service: Endoscopy;  Laterality: N/A;  . ESOPHAGOGASTRODUODENOSCOPY N/A 01/08/2013   Procedure: ESOPHAGOGASTRODUODENOSCOPY (EGD);  Surgeon: Beryle Beams, MD;  Location: [facility] ENDOSCOPY;  Service: Endoscopy;  Laterality: N/A;  . EYE SURGERY    . HAND SURGERY    . HOT HEMOSTASIS N/A 12/28/2015   Procedure:  HOT HEMOSTASIS (ARGON PLASMA COAGULATION/BICAP);  Surgeon: Carol Ada, MD;  Location: [facility] ENDOSCOPY;  Service: Endoscopy;  Laterality: N/A;  . HOT HEMOSTASIS N/A 02/20/2018   Procedure: HOT HEMOSTASIS (ARGON PLASMA COAGULATION/BICAP);  Surgeon: Carol Ada, MD;  Location: [facility] ENDOSCOPY;  Service: Endoscopy;  Laterality: N/A;  . HOT HEMOSTASIS N/A 03/27/2018   Procedure: HOT HEMOSTASIS (ARGON PLASMA COAGULATION/BICAP);  Surgeon: Carol Ada,  MD;  Location: [facility] ENDOSCOPY;  Service: Endoscopy;  Laterality: N/A;  . LEFT HEART CATHETERIZATION WITH CORONARY ANGIOGRAM N/A 03/09/2013   Procedure: LEFT HEART CATHETERIZATION WITH CORONARY ANGIOGRAM;  Surgeon: Laverda Page, MD;  Location: [facility] CATH LAB;  Service: Cardiovascular;  Laterality: N/A;  . LOWER EXTREMITY ANGIOGRAM N/A 12/29/2012   Procedure: LOWER EXTREMITY ANGIOGRAM;  Surgeon: Laverda Page, MD;  Location: [facility] CATH LAB;  Service: Cardiovascular;  Laterality: N/A;  . LOWER EXTREMITY ANGIOGRAM N/A 10/05/2013   Procedure: LOWER EXTREMITY ANGIOGRAM;  Surgeon: Laverda Page, MD;  Location: [facility] CATH LAB;  Service: Cardiovascular;  Laterality: N/A;  . LOWER EXTREMITY ANGIOGRAPHY N/A 02/11/2017   Procedure: Lower Extremity Angiography;  Surgeon: Adrian Prows, MD;  Location: [facility] CV LAB;  Service: Cardiovascular;  Laterality: N/A;  . PERIPHERAL VASCULAR BALLOON ANGIOPLASTY  02/11/2017   Procedure: PERIPHERAL VASCULAR BALLOON ANGIOPLASTY;  Surgeon: Adrian Prows, MD;  Location: [facility] CV LAB;  Service: Cardiovascular;;  Right SFA  . PERIPHERAL VASCULAR INTERVENTION Right 02/11/2017   Procedure: PERIPHERAL VASCULAR INTERVENTION;  Surgeon: Adrian Prows, MD;  Location: [facility] CV LAB;  Service: Cardiovascular;  Laterality: Right;  Rt SFA  . stent in right leg  12/29/2012   for blood clot, Dr. Nadyne Coombes  . TYMPANOMASTOIDECTOMY Left 03/08/2014   Procedure: TYMPANOMASTOIDECTOMY LEFT;  Surgeon: Ascencion Dike, MD;  Location: [facility];  Service: ENT;  Laterality: Left;  . TYMPANOMASTOIDECTOMY  2015  . UTERINE FIBROID SURGERY       OB History   None     Home Medications    Prior to Admission medications   Medication Sig Start Date End Date Taking? Authorizing Provider  albuterol (PROVENTIL HFA;VENTOLIN HFA) 108 (90 Base) MCG/ACT inhaler Inhale 1-2 puffs into the lungs every 6 (six) hours as needed for wheezing or shortness of breath.    [provider]  aspirin EC  81 MG tablet Take 81 mg by mouth 3 (three) times a week.     [provider]  atorvastatin (LIPITOR) 40 MG tablet Take 40 mg by mouth daily. 09/08/13   [provider]  benzonatate (TESSALON) 100 MG capsule Take 1 capsule (100 mg total) by mouth every 8 (eight) hours. 03/05/18   Joy, Shawn C, PA-C  Blood Glucose Monitoring Suppl (ACCU-CHEK NANO SMARTVIEW) w/Device KIT See admin instructions. 08/19/17   [provider]  clopidogrel (PLAVIX) 75 MG tablet Take 1 tablet (75 mg total) by mouth daily with breakfast. 02/12/17   Adrian Prows, MD  FeFum-FePoly-FA-B Cmp-C-Biot (INTEGRA PLUS) CAPS Take 1 capsule by mouth daily. 03/10/18   Maryanna Shape, NP  FEROSUL 325 (65 Fe) MG tablet Take 1 tablet by mouth 3 (three) times daily. 03/11/18   [provider]  gabapentin (NEURONTIN) 600 MG tablet Take 1 tablet (600 mg total) by mouth at bedtime. 10/02/17   Hyatt, Max T, DPM  hydrALAZINE (APRESOLINE) 50 MG tablet Take 50 mg by mouth 3 (three) times daily.     [provider]  metFORMIN (GLUCOPHAGE) 500 MG tablet Take 0.5 tablets (250 mg total) by  mouth 2 (two) times daily with a meal. 02/13/17   Adrian Prows, MD  nitroGLYCERIN (NITROSTAT) 0.4 MG SL tablet Place 0.4 mg under the tongue every 5 (five) minutes as needed for chest pain.    [provider]  omeprazole (PRILOSEC) 40 MG capsule Take 1 capsule by mouth daily. 03/04/18   [provider]  spironolactone (ALDACTONE) 25 MG tablet Take 25 mg by mouth daily.    [provider]    Family History Family History  Problem Relation Age of Onset  . Diabetes Mother   . Hypertension Mother   . Heart disease Mother   . Heart disease Father   . Hypertension Father     Social History Social History   Tobacco Use  . Smoking status: Current Every Day Smoker    Packs/day: 0.25    Years: 41.00    Pack years: 10.25    Types: Cigarettes  . Smokeless tobacco: Never Used  Substance Use Topics  .  Alcohol use: No  . Drug use: Yes    Types: Marijuana    Comment: last-02/24/14     Allergies   Patient has no known allergies.   Review of Systems Review of Systems  All other systems reviewed and are negative.   Physical Exam Updated Vital Signs BP (!) 200/88   Pulse (!) 50   Temp 98.1 F (36.7 C) (Oral)   Resp 14   Ht _0  (1.499 m)   Wt 93.4 kg   SpO2 96%   BMI 41.61 kg/m   Physical Exam  Constitutional: She is oriented to person, place, and time. She appears well-developed and well-nourished.  HENT:  Head: Normocephalic and atraumatic.  Bilateral TMs normal, oropharynx clear with no swelling or edema  Eyes: Pupils are equal, round, and reactive to light. Conjunctivae are normal. Right eye exhibits no discharge. Left eye exhibits no discharge. No scleral icterus.  Neck: Normal range of motion. No JVD present. No tracheal deviation present.  Cardiovascular: Normal rate, regular rhythm, normal heart sounds and intact distal pulses. Exam reveals no gallop and no friction rub.  No murmur heard. Pulmonary/Chest: Effort normal and breath sounds normal. No stridor. No respiratory distress. She has no wheezes. She has no rales.  Neurological: She is alert and oriented to person, place, and time. She displays normal reflexes. No cranial nerve deficit or sensory deficit. She exhibits normal muscle tone. Coordination normal. GCS eye subscore is 4. GCS verbal subscore is 5. GCS motor subscore is 6.  Skin: Skin is warm.  Psychiatric: She has a normal mood and affect. Her behavior is normal. Judgment and thought content normal.  Nursing note and vitals reviewed.   ED Treatments / Results  Labs (all labs ordered are listed, but only abnormal results are displayed) Labs Reviewed  CBC WITH DIFFERENTIAL/PLATELET - Abnormal; Notable for the following components:      Result Value   RBC 5.53 (*)    HCT 46.7 (*)    MCH 25.3 (*)    RDW 17.2 (*)    Platelets 418 (*)    All other  components within normal limits  BASIC METABOLIC PANEL    EKG None  Radiology Ct Head Wo Contrast  Result Date: 06/01/2018 CLINICAL DATA:  Headache for 4 weeks, normal neurological exam EXAM: CT HEAD WITHOUT CONTRAST TECHNIQUE: Contiguous axial images were obtained from the base of the skull through the vertex without intravenous contrast. Sagittal and coronal MPR images reconstructed from axial data set.  COMPARISON:  06/13/2008 FINDINGS: Brain: Normal ventricular morphology. No midline shift or mass effect. Normal appearance of brain parenchyma. No intracranial hemorrhage, mass lesion or evidence of acute infarction. No extra-axial fluid collections. Vascular: Unremarkable Skull: Intact Sinuses/Orbits: Clear Other: N/A IMPRESSION: Normal exam. Electronically Signed   By: Lavonia Dana M.D.   On: 06/01/2018 10:27    Procedures Procedures (including critical care time)  Medications Ordered in ED Medications  acetaminophen (TYLENOL) tablet 650 mg (650 mg Oral Given 06/01/18 0937)     Initial Impression / Assessment and Plan / ED Course  I have reviewed the triage vital signs and the nursing notes.  Pertinent labs & imaging results that were available during my care of the patient were reviewed by me and considered in my medical decision making (see chart for details).     Labs: CBC, BMP  Imaging: CT head without contrast  Consults:  Therapeutics:  Discharge Meds:   Assessment/Plan: 67 year old female presents today with complaints of headache.  Certain etiology of headache at this time, Tylenol completely resolved symptoms.  Patient is hypertensive here in the upper 190s.  Patient notes that she has been elevated recently and is working with her primary care to adjust medications.  She has been referred to neurology for her ongoing headache.  Patient asymptomatic after Tylenol reassuring head CT, discussed close outpatient follow-up with primary care, she verbalized understanding  and agreement to today's plan had no further questions or concerns.    Final Clinical Impressions(s) / ED Diagnoses   Final diagnoses:  Acute nonintractable headache, unspecified headache type  Secondary hypertension    ED Discharge Orders    None       Okey Regal, PA-C 06/01/18 1504    Daleen Bo, MD 06/01/18 1845

## 2018-06-03 DIAGNOSIS — D509 Iron deficiency anemia, unspecified: Secondary | ICD-10-CM | POA: Diagnosis not present

## 2018-06-04 DIAGNOSIS — R600 Localized edema: Secondary | ICD-10-CM | POA: Diagnosis not present

## 2018-06-04 DIAGNOSIS — I10 Essential (primary) hypertension: Secondary | ICD-10-CM | POA: Diagnosis not present

## 2018-06-10 DIAGNOSIS — H7112 Cholesteatoma of tympanum, left ear: Secondary | ICD-10-CM | POA: Diagnosis not present

## 2018-06-10 DIAGNOSIS — H9209 Otalgia, unspecified ear: Secondary | ICD-10-CM | POA: Diagnosis not present

## 2018-06-10 DIAGNOSIS — H6121 Impacted cerumen, right ear: Secondary | ICD-10-CM | POA: Diagnosis not present

## 2018-06-15 ENCOUNTER — Other Ambulatory Visit: Payer: Self-pay

## 2018-06-15 NOTE — Patient Outreach (Signed)
[facility]) Care Management  06/15/2018  [name] 05/07/1951 [id number]   Medication Adherence call to [name] left a message for patient to call back patient is due on Lisinopril 40 mg. [name] is showing past due under [place].   [place] Management Direct Dial [phone]  Fax [phone] [email or web address]

## 2018-08-10 DIAGNOSIS — M21961 Unspecified acquired deformity of right lower leg: Secondary | ICD-10-CM | POA: Diagnosis not present

## 2018-08-10 DIAGNOSIS — E1151 Type 2 diabetes mellitus with diabetic peripheral angiopathy without gangrene: Secondary | ICD-10-CM | POA: Diagnosis not present

## 2018-08-10 DIAGNOSIS — B351 Tinea unguium: Secondary | ICD-10-CM | POA: Diagnosis not present

## 2018-08-10 DIAGNOSIS — M21962 Unspecified acquired deformity of left lower leg: Secondary | ICD-10-CM | POA: Diagnosis not present

## 2018-08-10 DIAGNOSIS — M79672 Pain in left foot: Secondary | ICD-10-CM | POA: Diagnosis not present

## 2018-08-11 DIAGNOSIS — Z01118 Encounter for examination of ears and hearing with other abnormal findings: Secondary | ICD-10-CM | POA: Diagnosis not present

## 2018-08-11 DIAGNOSIS — J449 Chronic obstructive pulmonary disease, unspecified: Secondary | ICD-10-CM | POA: Diagnosis not present

## 2018-08-11 DIAGNOSIS — H539 Unspecified visual disturbance: Secondary | ICD-10-CM | POA: Diagnosis not present

## 2018-08-11 DIAGNOSIS — Z0001 Encounter for general adult medical examination with abnormal findings: Secondary | ICD-10-CM | POA: Diagnosis not present

## 2018-08-11 DIAGNOSIS — Z136 Encounter for screening for cardiovascular disorders: Secondary | ICD-10-CM | POA: Diagnosis not present

## 2018-08-11 DIAGNOSIS — E1165 Type 2 diabetes mellitus with hyperglycemia: Secondary | ICD-10-CM | POA: Diagnosis not present

## 2018-08-11 DIAGNOSIS — Z7689 Persons encountering health services in other specified circumstances: Secondary | ICD-10-CM | POA: Diagnosis not present

## 2018-09-07 ENCOUNTER — Other Ambulatory Visit: Payer: Self-pay | Admitting: Cardiology

## 2018-09-07 DIAGNOSIS — I10 Essential (primary) hypertension: Secondary | ICD-10-CM | POA: Diagnosis not present

## 2018-09-08 LAB — BASIC METABOLIC PANEL
BUN/Creatinine Ratio: 17 (ref 12–28)
BUN: 17 mg/dL (ref 8–27)
CALCIUM: 9.9 mg/dL (ref 8.7–10.3)
CO2: 25 mmol/L (ref 20–29)
CREATININE: 1.01 mg/dL — AB (ref 0.57–1.00)
Chloride: 101 mmol/L (ref 96–106)
GFR calc Af Amer: 66 mL/min/{1.73_m2} (ref >59)
GFR, EST NON AFRICAN AMERICAN: 57 mL/min/{1.73_m2} — AB (ref >59)
Glucose: 81 mg/dL (ref 65–99)
Potassium: 4.1 mmol/L (ref 3.5–5.2)
SODIUM: 140 mmol/L (ref 134–144)

## 2018-09-15 ENCOUNTER — Ambulatory Visit: Payer: Self-pay | Admitting: Cardiology

## 2018-09-18 ENCOUNTER — Ambulatory Visit: Payer: Medicare Other | Admitting: Cardiology

## 2018-09-18 ENCOUNTER — Encounter: Payer: Self-pay | Admitting: Cardiology

## 2018-09-18 ENCOUNTER — Other Ambulatory Visit: Payer: Self-pay

## 2018-09-18 ENCOUNTER — Ambulatory Visit (INDEPENDENT_AMBULATORY_CARE_PROVIDER_SITE_OTHER): Payer: Medicare Other | Admitting: Cardiology

## 2018-09-18 VITALS — BP 153/93 | HR 75 | Ht 60.0 in | Wt 205.0 lb

## 2018-09-18 DIAGNOSIS — I10 Essential (primary) hypertension: Secondary | ICD-10-CM

## 2018-09-18 DIAGNOSIS — E1151 Type 2 diabetes mellitus with diabetic peripheral angiopathy without gangrene: Secondary | ICD-10-CM | POA: Diagnosis not present

## 2018-09-18 DIAGNOSIS — F17200 Nicotine dependence, unspecified, uncomplicated: Secondary | ICD-10-CM | POA: Insufficient documentation

## 2018-09-18 MED ORDER — VALSARTAN-HYDROCHLOROTHIAZIDE 320-12.5 MG PO TABS: 1.0000 | ORAL_TABLET | Freq: Every day | ORAL | 1 refills | Status: DC

## 2018-09-18 NOTE — Progress Notes (Signed)
Subjective:   [name], female    DOB: 07-Jun-1951, 68 y.o.   MRN: [id number]  Benito Mccreedy, MD:  Chief Complaint  Patient presents with  . Hypertension  . Follow-up    HPI: [name]  is a 68 y.o. female  with  hypertension, hyperlipidemia, moderate CAD being treated medically and known peripheral arterial disease s/p PV angiogram with revascularization of SFA and has not had reoccurence of claudication, history of prior GI bleed in May 2014 and again in July 4th 2017, Upper GI and lower GI evaluation revealed multiple non-bleeding AVMs in the jejunum and colonic polyps and diverticular disease - Dr. Benson Norway. . She has not had any further recurrence of GI bleed. She underwent exercise nuclear stress test in which her excess capacity was low on 05/19/2017 and is no evidence of ischemia and she achieved only 83% of MPHR. Also underwent LE arterial duplex on 05/28/17, revealing right ABI has improved from 0.5 to 0.86, suggesting patent right SFA angioplasty site.  Patient presents for 3 month office visit follow-up for hypertension. Blood pressure has been fairly well controlled. She has been out of her Valsartan for some time. She previously had cough with Losartan. Overall, she is feeling well.   Urinary incontinence is unchanged. She has not noticed any worsening shortness of breath. Leg edema has remained stable. Unfortunately, she continues to smoke 1 pack of cigarettes daily.   Past Medical History:  Diagnosis Date  . Anemia 12/26/2015  . Anxiety   . Arthritis   . Blood transfusion without reported diagnosis   . Chronic pain   . COPD (chronic obstructive pulmonary disease) ([place])   . Deep vein blood clot of right lower extremity ([place]) 12/29/2012   Dr. Nadyne Coombes, stent in place  . Depression   . Diabetes mellitus without complication ([place])   . GERD (gastroesophageal reflux disease)   . Heart murmur   . Hyperlipemia   . Hypertension   . Sleep apnea    uses a  cpap  . Wears glasses     Past Surgical History:  Procedure Laterality Date  . ABDOMINAL HYSTERECTOMY    . CARDIAC CATHETERIZATION  4/15  . CATARACT EXTRACTION    . CESAREAN SECTION    . COLONOSCOPY N/A 01/08/2013   Procedure: COLONOSCOPY;  Surgeon: Beryle Beams, MD;  Location: [facility] ENDOSCOPY;  Service: Endoscopy;  Laterality: N/A;  . COLONOSCOPY N/A 12/28/2015   Procedure: COLONOSCOPY;  Surgeon: Carol Ada, MD;  Location[facility] ENDOSCOPY;  Service: Endoscopy;  Laterality: N/A;  . ENTEROSCOPY N/A 12/28/2015   Procedure: ENTEROSCOPY;  Surgeon: Carol Ada, MD;  Location: [facility];  Service: Endoscopy;  Laterality: N/A;  . ENTEROSCOPY N/A 02/20/2018   Procedure: ENTEROSCOPY;  Surgeon: Carol Ada, MD;  Location: [facility] ENDOSCOPY;  Service: Endoscopy;  Laterality: N/A;  . ENTEROSCOPY N/A 03/27/2018   Procedure: ENTEROSCOPY;  Surgeon: Carol Ada, MD;  Location: [facility] ENDOSCOPY;  Service: Endoscopy;  Laterality: N/A;  . ESOPHAGOGASTRODUODENOSCOPY N/A 01/08/2013   Procedure: ESOPHAGOGASTRODUODENOSCOPY (EGD);  Surgeon: Beryle Beams, MD;  Location: [facility];  Service: Endoscopy;  Laterality: N/A;  . EYE SURGERY    . HAND SURGERY    . HOT HEMOSTASIS N/A 12/28/2015   Procedure: HOT HEMOSTASIS (ARGON PLASMA COAGULATION/BICAP);  Surgeon: Carol Ada, MD;  Location: [facility] ENDOSCOPY;  Service: Endoscopy;  Laterality: N/A;  . HOT HEMOSTASIS N/A 02/20/2018   Procedure: HOT HEMOSTASIS (ARGON PLASMA COAGULATION/BICAP);  Surgeon: Carol Ada, MD;  Location: [facility]  ENDOSCOPY;  Service: Endoscopy;  Laterality: N/A;  . HOT HEMOSTASIS N/A 03/27/2018   Procedure: HOT HEMOSTASIS (ARGON PLASMA COAGULATION/BICAP);  Surgeon: Carol Ada, MD;  Location: [facility] ENDOSCOPY;  Service: Endoscopy;  Laterality: N/A;  . LEFT HEART CATHETERIZATION WITH CORONARY ANGIOGRAM N/A 03/09/2013   Procedure: LEFT HEART CATHETERIZATION WITH CORONARY ANGIOGRAM;  Surgeon: Laverda Page, MD;  Location: [facility] CATH LAB;  Service: Cardiovascular;   Laterality: N/A;  . LOWER EXTREMITY ANGIOGRAM N/A 12/29/2012   Procedure: LOWER EXTREMITY ANGIOGRAM;  Surgeon: Laverda Page, MD;  Location: [facility] CATH LAB;  Service: Cardiovascular;  Laterality: N/A;  . LOWER EXTREMITY ANGIOGRAM N/A 10/05/2013   Procedure: LOWER EXTREMITY ANGIOGRAM;  Surgeon: Laverda Page, MD;  Location: [facility] CATH LAB;  Service: Cardiovascular;  Laterality: N/A;  . LOWER EXTREMITY ANGIOGRAPHY N/A 02/11/2017   Procedure: Lower Extremity Angiography;  Surgeon: Adrian Prows, MD;  Location: [place] CV LAB;  Service: Cardiovascular;  Laterality: N/A;  . PERIPHERAL VASCULAR BALLOON ANGIOPLASTY  02/11/2017   Procedure: PERIPHERAL VASCULAR BALLOON ANGIOPLASTY;  Surgeon: Adrian Prows, MD;  Location: [place] CV LAB;  Service: Cardiovascular;;  Right SFA  . PERIPHERAL VASCULAR INTERVENTION Right 02/11/2017   Procedure: PERIPHERAL VASCULAR INTERVENTION;  Surgeon: Adrian Prows, MD;  Location: [place] CV LAB;  Service: Cardiovascular;  Laterality: Right;  Rt SFA  . stent in right leg  12/29/2012   for blood clot, Dr. Nadyne Coombes  . TYMPANOMASTOIDECTOMY Left 03/08/2014   Procedure: TYMPANOMASTOIDECTOMY LEFT;  Surgeon: Ascencion Dike, MD;  Location: [place];  Service: ENT;  Laterality: Left;  . TYMPANOMASTOIDECTOMY  2015  . UTERINE FIBROID SURGERY      Family History  Problem Relation Age of Onset  . Diabetes Mother   . Hypertension Mother   . Heart disease Mother   . Heart disease Father   . Hypertension Father     Social History   Socioeconomic History  . Marital status: Divorced    Spouse name: Not on file  . Number of children: 3  . Years of education: Not on file  . Highest education level: Not on file  Occupational History  . Not on file  Social Needs  . Financial resource strain: Not on file  . Food insecurity:    Worry: Not on file    Inability: Not on file  . Transportation needs:    Medical: Not on file    Non-medical: Not on file  Tobacco Use  .  Smoking status: Current Every Day Smoker    Packs/day: 0.50    Years: 41.00    Pack years: 20.50    Types: Cigarettes  . Smokeless tobacco: Never Used  Substance and Sexual Activity  . Alcohol use: Yes    Comment: rarely   . Drug use: Yes    Types: Marijuana    Comment: last-02/24/14  . Sexual activity: Not on file  Lifestyle  . Physical activity:    Days per week: Not on file    Minutes per session: Not on file  . Stress: Not on file  Relationships  . Social connections:    Talks on phone: Not on file    Gets together: Not on file    Attends religious service: Not on file    Active member of club or organization: Not on file    Attends meetings of clubs or organizations: Not on file    Relationship status: Not on file  . Intimate partner violence:    Fear  of current or ex partner: Not on file    Emotionally abused: Not on file    Physically abused: Not on file    Forced sexual activity: Not on file  Other Topics Concern  . Not on file  Social History Narrative  . Not on file    Current Meds  Medication Sig  . albuterol (PROVENTIL HFA;VENTOLIN HFA) 108 (90 Base) MCG/ACT inhaler Inhale 1-2 puffs into the lungs every 6 (six) hours as needed for wheezing or shortness of breath.  Marland Kitchen aspirin EC 81 MG tablet Take 81 mg by mouth 3 (three) times a week.   Marland Kitchen atorvastatin (LIPITOR) 40 MG tablet Take 40 mg by mouth daily.  . Blood Glucose Monitoring Suppl (ACCU-CHEK NANO SMARTVIEW) w/Device KIT See admin instructions.  . clopidogrel (PLAVIX) 75 MG tablet Take 1 tablet (75 mg total) by mouth daily with breakfast.  . gabapentin (NEURONTIN) 600 MG tablet Take 1 tablet (600 mg total) by mouth at bedtime.  . hydrALAZINE (APRESOLINE) 50 MG tablet Take 50 mg by mouth 3 (three) times daily.   . metFORMIN (GLUCOPHAGE) 500 MG tablet Take 0.5 tablets (250 mg total) by mouth 2 (two) times daily with a meal.  . nitroGLYCERIN (NITROSTAT) 0.4 MG SL tablet Place 0.4 mg under the tongue every 5  (five) minutes as needed for chest pain.  Marland Kitchen omeprazole (PRILOSEC) 40 MG capsule Take 1 capsule by mouth daily.  Marland Kitchen spironolactone (ALDACTONE) 25 MG tablet Take 25 mg by mouth daily.     Review of Systems  Constitution: Negative for decreased appetite, malaise/fatigue, weight gain and weight loss.  Eyes: Negative for visual disturbance.  Cardiovascular: Positive for dyspnea on exertion (chronic, stable). Negative for chest pain, claudication, leg swelling, orthopnea, palpitations and syncope.  Respiratory: Negative for hemoptysis and wheezing.   Endocrine: Negative for cold intolerance and heat intolerance.  Hematologic/Lymphatic: Does not bruise/bleed easily.  Skin: Negative for nail changes.  Musculoskeletal: Negative for muscle weakness and myalgias.  Gastrointestinal: Negative for abdominal pain, change in bowel habit, nausea and vomiting.  Genitourinary: Positive for bladder incontinence.  Neurological: Negative for difficulty with concentration, dizziness, focal weakness and headaches.  Psychiatric/Behavioral: Negative for altered mental status and suicidal ideas.  All other systems reviewed and are negative.      Objective:     Blood pressure (!) 153/93, pulse 75, height 5' (1.524 m), weight 205 lb (93 kg), SpO2 97 %.  Cardiac studies:  EKG 03/06/2018: Sinus rhythm at 61 bpm, normal axis, poor R-wave progression cannot exclude anterior infarct old. No evidence of ischemia. No significant changes compared to EKG 04/2017.  Echocardiogram 05/09/2017: Left ventricle cavity is normal in size. Mild concentric hypertrophy of the left ventricle. Normal global wall motion. Doppler evidence of grade I (impaired) diastolic dysfunction, normal LAP. LVEF 55-60%. Mild aortic valve stenosis. No significant change compared to study in 2016. Pulmonary hypertension noted in 2016 not present on this echocardiogram.  Exercise myoview stress 05/19/2017: 1. Patient achieved 4.4 METS and reached  83% target heart rate. Exercise capacity low for age. Appropriate heart rate and blood pressure response. Stress symptoms included dyspnea, dizziness. Submaximal exercise stress study. 2. No stress EKG changes suggestive of ischemia. 3. Stress and rest SPECT images demonstrate homogeneous tracer distribution throughout the myocardium. Gated SPECT imaging reveals normal myocardial thickening and wall motion. The left ventricular ejection fraction was normal (65%).  4. This is a low risk submaximal exercise stress study   Peripheral arteriogram 02/11/2017: Successful PTA with DCB  [id number] InPact Admiral in the right proximal to distal SFA and stenting of proximal SFA with DES, [place] 6x100 mm stent. 3 vessel r/o. Left mild disease and 2 vessel R/O. Mild disease left by angiogram 10/05/2013. Lower extremity arterial duplex 04/15/2018: No hemodynamically significant stenoses are identified in the lower extremity arterial system. Mild diffuse calcific plaque noted. This exam reveals mildly decreased perfusion of the right lower extremity, noted at the anterior tibial artery level (ABI 0.86) and normal perfusion of the left lower extremity (ABI 0.97). No signficant change since 05/28/2017. Study suggests patent right SFA angioplasty site.  Lower extremity arterial duplex 04/15/2018: No hemodynamically significant stenoses are identified in the lower extremity arterial system. Mild diffuse calcific plaque noted. This exam reveals mildly decreased perfusion of the right lower extremity, noted at the anterior tibial artery level (ABI 0.86) and normal perfusion of the left lower extremity (ABI 0.97). No signficant change since 05/28/2017. Study suggests patent right SFA angioplasty site.  Carotid artery duplex 07/25/2015: Antegrade right vertebral artery flow. Antegrade left vertebral artery flow. No hemodynamically significant arterial disease in the internal carotid artery bilaterally. No significant change from  11/26/2012.  Coronary angiogram 02/27/2014: Mid RCA 60-70% stenosis, mid LAD 60-70% stenosis. FFR to the LAD, lesion insignificant. Medical therapy for CAD. Moderate aortic valve calcification.  Recent Labs: 02/03/2018: Glucose 118, Creatinine 0.71, Sodium 146, Potassium 4.4, BMP normal.   Physical Exam  Constitutional: She is oriented to person, place, and time. Vital signs are normal. She appears well-developed and well-nourished.  HENT:  Head: Normocephalic and atraumatic.  Neck: Normal range of motion. Neck supple. No tracheal deviation present. No thyromegaly present.  Cardiovascular: Normal rate, regular rhythm and intact distal pulses.  Murmur heard.  Blowing early systolic murmur is present with a grade of 2/6 at the upper right sternal border radiating to the neck. Pulses:      Carotid pulses are on the right side with bruit and on the left side with bruit.      Femoral pulses are 1+ on the right side and 1+ on the left side.      Popliteal pulses are 1+ on the right side and 1+ on the left side.       Dorsalis pedis pulses are 2+ on the right side and 1+ on the left side.       Posterior tibial pulses are 0 on the right side and 1+ on the left side.  Pulses difficult to palpate due to bodily habitus.  Pulmonary/Chest: Effort normal and breath sounds normal. No accessory muscle usage. No respiratory distress.  Abdominal: Soft. Bowel sounds are normal.  Musculoskeletal: Normal range of motion.  Neurological: She is alert and oriented to person, place, and time.  Skin: Skin is warm and dry.  Psychiatric: She has a normal mood and affect.  Vitals reviewed.       Assessment & Recommendations:  1. Essential hypertension Blood pressure is elevated today. She has been out of her Valsartan for sometime. Will refill Valsartan HCT 320-12.'5mg'$  daily. Continue with diet modifications including low sodium diet.   2. Type 2 diabetes mellitus with peripheral vascular disease ([place]) She  reports that diabetes is well controlled. She denies any symptoms of claudication. Continues to be on DAPT. Will consider discontinuing Plavix at her next office visit.   3. Tobacco use disorder I have again encouraged complete smoking cessation. She is aware of long term risk of continued tobacco use. She does not wish to try medications  at this time. Will continue to work to quit on her own.    Plan: Patient is presently doing well, without any complaints today. She is to have labs with her PCP in the next few weeks and will request that they check BMP since going back on Valsartan. I will see her back in 4 months for follow up on HTN and PAD.   Jeri Lager, MSN, APRN, FNP-C [facility], [place] Office: [phone] Fax: [phone]

## 2018-09-21 ENCOUNTER — Encounter: Payer: Self-pay | Admitting: Cardiology

## 2018-11-02 ENCOUNTER — Other Ambulatory Visit: Payer: Self-pay | Admitting: Cardiology

## 2018-11-02 MED ORDER — HYDRALAZINE HCL 50 MG PO TABS: 50.0000 mg | ORAL_TABLET | Freq: Three times a day (TID) | ORAL | 3 refills | Status: DC

## 2018-11-09 ENCOUNTER — Other Ambulatory Visit: Payer: Self-pay | Admitting: Internal Medicine

## 2018-11-09 DIAGNOSIS — L602 Onychogryphosis: Secondary | ICD-10-CM | POA: Diagnosis not present

## 2018-11-09 DIAGNOSIS — M7662 Achilles tendinitis, left leg: Secondary | ICD-10-CM | POA: Diagnosis not present

## 2018-11-09 DIAGNOSIS — Z1231 Encounter for screening mammogram for malignant neoplasm of breast: Secondary | ICD-10-CM

## 2018-11-09 DIAGNOSIS — E1151 Type 2 diabetes mellitus with diabetic peripheral angiopathy without gangrene: Secondary | ICD-10-CM | POA: Diagnosis not present

## 2018-12-04 DIAGNOSIS — E7800 Pure hypercholesterolemia, unspecified: Secondary | ICD-10-CM | POA: Diagnosis not present

## 2018-12-04 DIAGNOSIS — I10 Essential (primary) hypertension: Secondary | ICD-10-CM | POA: Diagnosis not present

## 2018-12-04 DIAGNOSIS — F17200 Nicotine dependence, unspecified, uncomplicated: Secondary | ICD-10-CM | POA: Diagnosis not present

## 2018-12-04 DIAGNOSIS — J449 Chronic obstructive pulmonary disease, unspecified: Secondary | ICD-10-CM | POA: Diagnosis not present

## 2018-12-04 DIAGNOSIS — E1165 Type 2 diabetes mellitus with hyperglycemia: Secondary | ICD-10-CM | POA: Diagnosis not present

## 2018-12-04 DIAGNOSIS — K219 Gastro-esophageal reflux disease without esophagitis: Secondary | ICD-10-CM | POA: Diagnosis not present

## 2018-12-04 DIAGNOSIS — E1140 Type 2 diabetes mellitus with diabetic neuropathy, unspecified: Secondary | ICD-10-CM | POA: Diagnosis not present

## 2018-12-04 DIAGNOSIS — G44219 Episodic tension-type headache, not intractable: Secondary | ICD-10-CM | POA: Diagnosis not present

## 2018-12-04 DIAGNOSIS — F329 Major depressive disorder, single episode, unspecified: Secondary | ICD-10-CM | POA: Diagnosis not present

## 2018-12-07 DIAGNOSIS — D509 Iron deficiency anemia, unspecified: Secondary | ICD-10-CM | POA: Diagnosis not present

## 2018-12-07 DIAGNOSIS — K31811 Angiodysplasia of stomach and duodenum with bleeding: Secondary | ICD-10-CM | POA: Diagnosis not present

## 2018-12-09 DIAGNOSIS — H903 Sensorineural hearing loss, bilateral: Secondary | ICD-10-CM | POA: Diagnosis not present

## 2018-12-09 DIAGNOSIS — H6122 Impacted cerumen, left ear: Secondary | ICD-10-CM | POA: Diagnosis not present

## 2018-12-21 ENCOUNTER — Other Ambulatory Visit: Payer: Self-pay

## 2018-12-21 ENCOUNTER — Emergency Department (HOSPITAL_COMMUNITY)
Admission: EM | Admit: 2018-12-21 | Discharge: 2018-12-21 | Disposition: A | Discharge status: Home / Self Care | Payer: Medicare HMO | Attending: Emergency Medicine | Admitting: Emergency Medicine

## 2018-12-21 DIAGNOSIS — Z7982 Long term (current) use of aspirin: Secondary | ICD-10-CM | POA: Insufficient documentation

## 2018-12-21 DIAGNOSIS — R197 Diarrhea, unspecified: Secondary | ICD-10-CM | POA: Diagnosis not present

## 2018-12-21 DIAGNOSIS — A09 Infectious gastroenteritis and colitis, unspecified: Secondary | ICD-10-CM | POA: Diagnosis not present

## 2018-12-21 DIAGNOSIS — J449 Chronic obstructive pulmonary disease, unspecified: Secondary | ICD-10-CM | POA: Insufficient documentation

## 2018-12-21 DIAGNOSIS — Z79899 Other long term (current) drug therapy: Secondary | ICD-10-CM | POA: Insufficient documentation

## 2018-12-21 DIAGNOSIS — E119 Type 2 diabetes mellitus without complications: Secondary | ICD-10-CM | POA: Diagnosis not present

## 2018-12-21 DIAGNOSIS — I10 Essential (primary) hypertension: Secondary | ICD-10-CM | POA: Insufficient documentation

## 2018-12-21 DIAGNOSIS — F17210 Nicotine dependence, cigarettes, uncomplicated: Secondary | ICD-10-CM | POA: Insufficient documentation

## 2018-12-21 DIAGNOSIS — Z7984 Long term (current) use of oral hypoglycemic drugs: Secondary | ICD-10-CM | POA: Insufficient documentation

## 2018-12-21 DIAGNOSIS — E876 Hypokalemia: Secondary | ICD-10-CM

## 2018-12-21 LAB — CBC WITH DIFFERENTIAL/PLATELET
Abs Immature Granulocytes: 0.02 10*3/uL (ref 0.00–0.07)
Basophils Absolute: 0 10*3/uL (ref 0.0–0.1)
Basophils Relative: 0 %
Eosinophils Absolute: 0.1 10*3/uL (ref 0.0–0.5)
Eosinophils Relative: 2 %
HCT: 46.1 % — ABNORMAL HIGH (ref 36.0–46.0)
Hemoglobin: 14.9 g/dL (ref 12.0–15.0)
Immature Granulocytes: 0 %
Lymphocytes Relative: 28 %
Lymphs Abs: 1.9 10*3/uL (ref 0.7–4.0)
MCH: 28.7 pg (ref 26.0–34.0)
MCHC: 32.3 g/dL (ref 30.0–36.0)
MCV: 88.7 fL (ref 80.0–100.0)
Monocytes Absolute: 0.6 10*3/uL (ref 0.1–1.0)
Monocytes Relative: 9 %
Neutro Abs: 4.2 10*3/uL (ref 1.7–7.7)
Neutrophils Relative %: 61 %
Platelets: 351 10*3/uL (ref 150–400)
RBC: 5.2 MIL/uL — ABNORMAL HIGH (ref 3.87–5.11)
RDW: 14.9 % (ref 11.5–15.5)
WBC: 6.9 10*3/uL (ref 4.0–10.5)
nRBC: 0 % (ref 0.0–0.2)

## 2018-12-21 LAB — CBC
HCT: 45.8 % (ref 36.0–46.0)
Hemoglobin: 14.7 g/dL (ref 12.0–15.0)
MCH: 28.4 pg (ref 26.0–34.0)
MCHC: 32.1 g/dL (ref 30.0–36.0)
MCV: 88.4 fL (ref 80.0–100.0)
Platelets: 354 10*3/uL (ref 150–400)
RBC: 5.18 MIL/uL — ABNORMAL HIGH (ref 3.87–5.11)
RDW: 14.6 % (ref 11.5–15.5)
WBC: 7.1 10*3/uL (ref 4.0–10.5)
nRBC: 0 % (ref 0.0–0.2)

## 2018-12-21 LAB — COMPREHENSIVE METABOLIC PANEL
ALT: 10 U/L (ref 0–44)
AST: 19 U/L (ref 15–41)
Albumin: 3.6 g/dL (ref 3.5–5.0)
Alkaline Phosphatase: 71 U/L (ref 38–126)
Anion gap: 10 (ref 5–15)
BUN: 13 mg/dL (ref 8–23)
CO2: 27 mmol/L (ref 22–32)
Calcium: 9.4 mg/dL (ref 8.9–10.3)
Chloride: 104 mmol/L (ref 98–111)
Creatinine, Ser: 0.96 mg/dL (ref 0.44–1.00)
GFR calc Af Amer: >60 mL/min (ref >60)
GFR calc non Af Amer: >60 mL/min (ref >60)
Glucose, Bld: 119 mg/dL — ABNORMAL HIGH (ref 70–99)
Potassium: 3 mmol/L — ABNORMAL LOW (ref 3.5–5.1)
Sodium: 141 mmol/L (ref 135–145)
Total Bilirubin: 0.8 mg/dL (ref 0.3–1.2)
Total Protein: 7.2 g/dL (ref 6.5–8.1)

## 2018-12-21 LAB — URINALYSIS, ROUTINE W REFLEX MICROSCOPIC
Bilirubin Urine: NEGATIVE
Glucose, UA: NEGATIVE mg/dL
Hgb urine dipstick: NEGATIVE
Ketones, ur: NEGATIVE mg/dL
Leukocytes,Ua: NEGATIVE
Nitrite: NEGATIVE
Protein, ur: NEGATIVE mg/dL
Specific Gravity, Urine: 1.017 (ref 1.005–1.030)
pH: 6 (ref 5.0–8.0)

## 2018-12-21 LAB — LIPASE, BLOOD: Lipase: 25 U/L (ref 11–51)

## 2018-12-21 MED ORDER — SODIUM CHLORIDE 0.9 % IV SOLN
1000.0000 mL | INTRAVENOUS | Status: DC
  Administered 2018-12-21: 09:00:00 1000 mL via INTRAVENOUS

## 2018-12-21 MED ORDER — SODIUM CHLORIDE 0.9 % IV BOLUS (SEPSIS)
500.0000 mL | Freq: Once | INTRAVENOUS | Status: AC
  Administered 2018-12-21: 09:00:00 500 mL via INTRAVENOUS

## 2018-12-21 MED ORDER — POTASSIUM CHLORIDE CRYS ER 20 MEQ PO TBCR
40.0000 meq | EXTENDED_RELEASE_TABLET | Freq: Once | ORAL | Status: AC
  Administered 2018-12-21: 40 meq via ORAL
  Filled 2018-12-21: qty 2

## 2018-12-21 MED ORDER — SODIUM CHLORIDE 0.9% FLUSH
3.0000 mL | Freq: Once | INTRAVENOUS | Status: AC
  Administered 2018-12-21: 3 mL via INTRAVENOUS

## 2018-12-21 MED ORDER — POTASSIUM CHLORIDE CRYS ER 20 MEQ PO TBCR: 20.0000 meq | EXTENDED_RELEASE_TABLET | Freq: Two times a day (BID) | ORAL | 0 refills | Status: DC

## 2018-12-21 MED ORDER — LOPERAMIDE HCL 2 MG PO CAPS: 2.0000 mg | ORAL_CAPSULE | Freq: Four times a day (QID) | ORAL | 0 refills | Status: DC | PRN

## 2018-12-21 NOTE — ED Triage Notes (Signed)
Pt reports mid lower abdominal pain since last Monday after eating fast food. Denies recent fever, vomiting. Pt reports profuse diarrhea. PT awake, alert, appropriate.

## 2018-12-21 NOTE — ED Provider Notes (Signed)
[facility] EMERGENCY DEPARTMENT Provider Note   CSN: [id number] Arrival date & time: 12/21/18  0802    History   Chief Complaint Chief Complaint  Patient presents with  . Abdominal Pain  . Diarrhea    HPI [name] is a 68 y.o. female.     HPI Patient reports that she has been having problems with diarrhea since Monday a week ago.  She reports that she ate at [facility] and had a Parmesan chicken dish and then came home and had a ice cream cone that was in her freezer for the past 2 weeks.  She reports that after that she started having frequent diarrhea.  Is been loose and brown.  No blood that she is seen.  She reports she gets some central cramping abdominal discomfort.  She reports that now she poops anytime she eats anything.  She reports she is going multiple times per day.  No fever that she is aware of.  No vomiting.  No general body aches no cough.  No urinary symptoms of pain burning or urgency. Past Medical History:  Diagnosis Date  . Anemia 12/26/2015  . Anxiety   . Arthritis   . Blood transfusion without reported diagnosis   . Chronic pain   . COPD (chronic obstructive pulmonary disease) ([place])   . Deep vein blood clot of right lower extremity ([place]) 12/29/2012   Dr. Nadyne Coombes, stent in place  . Depression   . Diabetes mellitus without complication ([place])   . GERD (gastroesophageal reflux disease)   . Heart murmur   . Hyperlipemia   . Hypertension   . Sleep apnea    uses a cpap  . Wears glasses     Patient Active Problem List   Diagnosis Date Noted  . Tobacco use disorder 09/18/2018  . Iron deficiency anemia 04/09/2018  . Symptomatic anemia 12/26/2015  . Hypokalemia 12/26/2015  . Type 2 diabetes mellitus with peripheral vascular disease ([place]) 12/26/2015  . Claudication in peripheral vascular disease ([place]) 10/05/2013  . Discomfort in chest 05/11/2012  . Nicotine dependence 05/11/2012  . Bradycardia 05/11/2012  . HTN (hypertension)  05/11/2012  . Back pain 05/11/2012    Past Surgical History:  Procedure Laterality Date  . ABDOMINAL HYSTERECTOMY    . CARDIAC CATHETERIZATION  4/15  . CATARACT EXTRACTION    . CESAREAN SECTION    . COLONOSCOPY N/A 01/08/2013   Procedure: COLONOSCOPY;  Surgeon: Beryle Beams, MD;  Location: [facility] ENDOSCOPY;  Service: Endoscopy;  Laterality: N/A;  . COLONOSCOPY N/A 12/28/2015   Procedure: COLONOSCOPY;  Surgeon: Carol Ada, MD;  Location: [facility] ENDOSCOPY;  Service: Endoscopy;  Laterality: N/A;  . ENTEROSCOPY N/A 12/28/2015   Procedure: ENTEROSCOPY;  Surgeon: Carol Ada, MD;  Location: [place];  Service: Endoscopy;  Laterality: N/A;  . ENTEROSCOPY N/A 02/20/2018   Procedure: ENTEROSCOPY;  Surgeon: Carol Ada, MD;  Location: [facility] ENDOSCOPY;  Service: Endoscopy;  Laterality: N/A;  . ENTEROSCOPY N/A 03/27/2018   Procedure: ENTEROSCOPY;  Surgeon: Carol Ada, MD;  Location: [facility] ENDOSCOPY;  Service: Endoscopy;  Laterality: N/A;  . ESOPHAGOGASTRODUODENOSCOPY N/A 01/08/2013   Procedure: ESOPHAGOGASTRODUODENOSCOPY (EGD);  Surgeon: Beryle Beams, MD;  Location: [facility] ENDOSCOPY;  Service: Endoscopy;  Laterality: N/A;  . EYE SURGERY    . HAND SURGERY    . HOT HEMOSTASIS N/A 12/28/2015   Procedure: HOT HEMOSTASIS (ARGON PLASMA COAGULATION/BICAP);  Surgeon: Carol Ada, MD;  Location: [facility] ENDOSCOPY;  Service: Endoscopy;  Laterality: N/A;  . HOT HEMOSTASIS  N/A 02/20/2018   Procedure: HOT HEMOSTASIS (ARGON PLASMA COAGULATION/BICAP);  Surgeon: Carol Ada, MD;  Location: [facility] ENDOSCOPY;  Service: Endoscopy;  Laterality: N/A;  . HOT HEMOSTASIS N/A 03/27/2018   Procedure: HOT HEMOSTASIS (ARGON PLASMA COAGULATION/BICAP);  Surgeon: Carol Ada, MD;  Location: [facility] ENDOSCOPY;  Service: Endoscopy;  Laterality: N/A;  . LEFT HEART CATHETERIZATION WITH CORONARY ANGIOGRAM N/A 03/09/2013   Procedure: LEFT HEART CATHETERIZATION WITH CORONARY ANGIOGRAM;  Surgeon: Laverda Page, MD;  Location: [facility] CATH LAB;  Service:  Cardiovascular;  Laterality: N/A;  . LOWER EXTREMITY ANGIOGRAM N/A 12/29/2012   Procedure: LOWER EXTREMITY ANGIOGRAM;  Surgeon: Laverda Page, MD;  Location[facility] C[facility] LAB;  Service: Cardiovascular;  Laterality: N/A;  . LOWER EXTREMITY ANGIOGRAM N/A 10/05/2013   Procedure: LOWER EXTREMITY ANGIOGRAM;  Surgeon: Laverda Page, MD;  Location: [facility] CATH LAB;  Service: Cardiovascular;  Laterality: N/A;  . LOWER EXTREMITY ANGIOGRAPHY N/A 02/11/2017   Procedure: Lower Extremity Angiography;  Surgeon: Adrian Prows, MD;  Location: [place] CV LAB;  Service: Cardiovascular;  Laterality: N/A;  . PERIPHERAL VASCULAR BALLOON ANGIOPLASTY  02/11/2017   Procedure: PERIPHERAL VASCULAR BALLOON ANGIOPLASTY;  Surgeon: Adrian Prows, MD;  Location: [place] CV LAB;  Service: Cardiovascular;;  Right SFA  . PERIPHERAL VASCULAR INTERVENTION Right 02/11/2017   Procedure: PERIPHERAL VASCULAR INTERVENTION;  Surgeon: Adrian Prows, MD;  Location: [place] CV LAB;  Service: Cardiovascular;  Laterality: Right;  Rt SFA  . stent in right leg  12/29/2012   for blood clot, Dr. Nadyne Coombes  . TYMPANOMASTOIDECTOMY Left 03/08/2014   Procedure: TYMPANOMASTOIDECTOMY LEFT;  Surgeon: Ascencion Dike, MD;  Location: [place];  Service: ENT;  Laterality: Left;  . TYMPANOMASTOIDECTOMY  2015  . UTERINE FIBROID SURGERY       OB History   No obstetric history on file.      Home Medications    Prior to Admission medications   Medication Sig Start Date End Date Taking? Authorizing Provider  albuterol (PROVENTIL HFA;VENTOLIN HFA) 108 (90 Base) MCG/ACT inhaler Inhale 1-2 puffs into the lungs every 6 (six) hours as needed for wheezing or shortness of breath.   Yes [provider]  aspirin EC 81 MG tablet Take 81 mg by mouth 3 (three) times a week.    Yes [provider]  atorvastatin (LIPITOR) 40 MG tablet Take 40 mg by mouth daily. 09/08/13  Yes [provider]  gabapentin (NEURONTIN) 600 MG tablet Take 1  tablet (600 mg total) by mouth at bedtime. Patient taking differently: Take 600 mg by mouth at bedtime as needed (pain).  10/02/17  Yes Hyatt, Max T, DPM  hydrALAZINE (APRESOLINE) 50 MG tablet Take 1 tablet (50 mg total) by mouth 3 (three) times daily. 11/02/18  Yes Miquel Dunn, NP  metFORMIN (GLUCOPHAGE) 500 MG tablet Take 0.5 tablets (250 mg total) by mouth 2 (two) times daily with a meal. 02/13/17  Yes Adrian Prows, MD  valsartan-hydrochlorothiazide (DIOVAN-HCT) 320-12.5 MG tablet Take 1 tablet by mouth daily. 09/18/18  Yes Miquel Dunn, NP  clopidogrel (PLAVIX) 75 MG tablet Take 1 tablet (75 mg total) by mouth daily with breakfast. Patient not taking: Reported on 12/21/2018 02/12/17   Adrian Prows, MD  FeFum-FePoly-FA-B Cmp-C-Biot (INTEGRA PLUS) CAPS Take 1 capsule by mouth daily. Patient not taking: Reported on 09/18/2018 03/10/18   Maryanna Shape, NP  loperamide (IMODIUM) 2 MG capsule Take 1 capsule (2 mg total) by mouth 4 (four) times daily as needed for diarrhea or  loose stools. 12/21/18   Charlesetta Shanks, MD  potassium chloride SA (K-DUR) 20 MEQ tablet Take 1 tablet (20 mEq total) by mouth 2 (two) times daily. 12/21/18   Charlesetta Shanks, MD    Family History Family History  Problem Relation Age of Onset  . Diabetes Mother   . Hypertension Mother   . Heart disease Mother   . Heart disease Father   . Hypertension Father     Social History Social History   Tobacco Use  . Smoking status: Current Every Day Smoker    Packs/day: 0.50    Years: 41.00    Pack years: 20.50    Types: Cigarettes  . Smokeless tobacco: Never Used  Substance Use Topics  . Alcohol use: Yes    Comment: rarely   . Drug use: Yes    Types: Marijuana    Comment: last-02/24/14     Allergies   Patient has no known allergies.   Review of Systems Review of Systems 10 Systems reviewed and are negative for acute change except as noted in the HPI.  Physical Exam Updated Vital Signs BP (!)  155/78 (BP Location: Left Arm)   Pulse (!) 49   Resp 16   Ht 5' (1.524 m)   Wt 94.3 kg   SpO2 96%   BMI 40.62 kg/m   Physical Exam Constitutional:      Appearance: She is well-developed.  HENT:     Head: Normocephalic and atraumatic.  Eyes:     Pupils: Pupils are equal, round, and reactive to light.  Neck:     Musculoskeletal: Neck supple.  Cardiovascular:     Rate and Rhythm: Normal rate and regular rhythm.     Heart sounds: Normal heart sounds.  Pulmonary:     Effort: Pulmonary effort is normal.     Breath sounds: Normal breath sounds.  Abdominal:     General: Bowel sounds are normal. There is no distension.     Palpations: Abdomen is soft.     Tenderness: There is abdominal tenderness.     Comments: Mild central lower abdominal tenderness to palpation.  No guarding.  Musculoskeletal: Normal range of motion.  Skin:    General: Skin is warm and dry.  Neurological:     Mental Status: She is alert and oriented to person, place, and time.     GCS: GCS eye subscore is 4. GCS verbal subscore is 5. GCS motor subscore is 6.     Coordination: Coordination normal.      ED Treatments / Results  Labs (all labs ordered are listed, but only abnormal results are displayed) Labs Reviewed  COMPREHENSIVE METABOLIC PANEL - Abnormal; Notable for the following components:      Result Value   Potassium 3.0 (*)    Glucose, Bld 119 (*)    All other components within normal limits  CBC - Abnormal; Notable for the following components:   RBC 5.18 (*)    All other components within normal limits  URINALYSIS, ROUTINE W REFLEX MICROSCOPIC - Abnormal; Notable for the following components:   APPearance CLOUDY (*)    All other components within normal limits  CBC WITH DIFFERENTIAL/PLATELET - Abnormal; Notable for the following components:   RBC 5.20 (*)    HCT 46.1 (*)    All other components within normal limits  GASTROINTESTINAL PANEL BY PCR, STOOL (REPLACES STOOL CULTURE)  C  DIFFICILE QUICK SCREEN W PCR REFLEX  LIPASE, BLOOD  CBC WITH DIFFERENTIAL/PLATELET  EKG None  Radiology No results found.  Procedures Procedures (including critical care time)  Medications Ordered in ED Medications  sodium chloride 0.9 % bolus 500 mL (0 mLs Intravenous Stopped 12/21/18 1030)    Followed by  0.9 %  sodium chloride infusion (1,000 mLs Intravenous New Bag/Given 12/21/18 0912)  sodium chloride flush (NS) 0.9 % injection 3 mL (3 mLs Intravenous Given 12/21/18 0829)  potassium chloride SA (K-DUR) CR tablet 40 mEq (40 mEq Oral Given 12/21/18 1029)     Initial Impression / Assessment and Plan / ED Course  I have reviewed the triage vital signs and the nursing notes.  Pertinent labs & imaging results that were available during my care of the patient were reviewed by me and considered in my medical decision making (see chart for details).       Patient has had diarrhea since eating fast food 1 week ago.  Clinically she is nontoxic and alert in appearance.  Abdomen is nonsurgical on exam.  She is reporting frequent diarrhea particularly triggered by any eating. No  Blood.  No fever.  Aside from potassium, diagnostic evaluation normal.  Patient was given supplemental potassium.  Continue on about 4 more days of supplemental potassium.  Will obtain outpatient stool cultures patient was not able to produce specimen in the emergency department.  Patient is given prescription for outpatient stool collection.  Instruction to follow-up with her PCP.  Return precautions reviewed.  Final Clinical Impressions(s) / ED Diagnoses   Final diagnoses:  Diarrhea of presumed infectious origin  Hypokalemia due to excessive gastrointestinal loss of potassium    ED Discharge Orders         Ordered    loperamide (IMODIUM) 2 MG capsule  4 times daily PRN     12/21/18 1046    potassium chloride SA (K-DUR) 20 MEQ tablet  2 times daily     12/21/18 1046    GI Profile, Stool, PCR      12/21/18 1048    Clostridium Difficile by PCR([facility])  Status:  Canceled     12/21/18 1048           Charlesetta Shanks, MD 12/21/18 1129

## 2018-12-22 ENCOUNTER — Telehealth: Payer: Self-pay | Admitting: *Deleted

## 2018-12-22 DIAGNOSIS — R197 Diarrhea, unspecified: Secondary | ICD-10-CM | POA: Diagnosis not present

## 2018-12-22 DIAGNOSIS — E1165 Type 2 diabetes mellitus with hyperglycemia: Secondary | ICD-10-CM | POA: Diagnosis not present

## 2018-12-22 NOTE — Telephone Encounter (Signed)
Received call from pt stating she took her stool sample to [place], but does not recall receiving a rx for labwork. Contacted [facility], spoke to Judson Roch and was provided fax number [phone]. Faxed order to [facility] to send results to her PCP, Dr Marylen Ponto office. Called pt back and told her to contact PCP's office for a follow up appt. Jonnie Finner RN CCM Case Mgmt phone [phone]

## 2018-12-24 ENCOUNTER — Telehealth: Payer: Self-pay | Admitting: *Deleted

## 2018-12-29 ENCOUNTER — Ambulatory Visit: Payer: Medicare Other

## 2018-12-31 ENCOUNTER — Other Ambulatory Visit: Payer: Self-pay

## 2018-12-31 MED ORDER — VALSARTAN-HYDROCHLOROTHIAZIDE 320-12.5 MG PO TABS: 1.0000 | ORAL_TABLET | Freq: Every day | ORAL | 1 refills | Status: DC

## 2019-01-13 ENCOUNTER — Other Ambulatory Visit: Payer: Self-pay

## 2019-01-13 ENCOUNTER — Ambulatory Visit (INDEPENDENT_AMBULATORY_CARE_PROVIDER_SITE_OTHER): Payer: Medicare HMO | Admitting: Cardiology

## 2019-01-13 ENCOUNTER — Encounter: Payer: Self-pay | Admitting: Cardiology

## 2019-01-13 VITALS — BP 147/92 | HR 62 | Temp 99.1°F | Ht 60.0 in | Wt 206.0 lb

## 2019-01-13 DIAGNOSIS — I209 Angina pectoris, unspecified: Secondary | ICD-10-CM | POA: Diagnosis not present

## 2019-01-13 DIAGNOSIS — I350 Nonrheumatic aortic (valve) stenosis: Secondary | ICD-10-CM | POA: Diagnosis not present

## 2019-01-13 DIAGNOSIS — I10 Essential (primary) hypertension: Secondary | ICD-10-CM

## 2019-01-13 DIAGNOSIS — E1151 Type 2 diabetes mellitus with diabetic peripheral angiopathy without gangrene: Secondary | ICD-10-CM | POA: Diagnosis not present

## 2019-01-13 DIAGNOSIS — F17200 Nicotine dependence, unspecified, uncomplicated: Secondary | ICD-10-CM

## 2019-01-13 MED ORDER — NITROGLYCERIN 0.4 MG SL SUBL: 0.4000 mg | SUBLINGUAL_TABLET | SUBLINGUAL | 3 refills | Status: DC | PRN

## 2019-01-13 NOTE — Progress Notes (Signed)
Primary Physician:  Benito Mccreedy, MD   Patient ID: [name], female    DOB: Nov 28, 1950, 68 y.o.   MRN: [id number]  Subjective:    Chief Complaint  Patient presents with  . PAD    4 month f/u  . Hypertension    pt asking for nitroglycerin     HPI: [name]  is a 69 y.o. female  with hypertension, hyperlipidemia, moderate CAD being treated medically and known peripheral arterial disease s/p PV angiogram with revascularization of SFA and has not had reoccurence of claudication, history of prior GI bleed in May 2014 and again in July 4th 2017, Upper GI and lower GI evaluation revealed multiple non-bleeding AVMs in the jejunum and colonic polyps and diverticular disease - Dr. Benson Norway. . She has not had any further recurrence of GI bleed. She underwent exercise nuclear stress test in which her excess capacity was low on 05/19/2017 and is no evidence of ischemia and she achieved only 83% of MPHR. Also underwent LE arterial duplex on 04/15/2018, revealing right ABI has improved from 0.5 to 0.86, suggesting patent right SFA angioplasty site.  Patient presents for 3 month office visit follow-up for hypertension. Patient has not been checking her blood pressure regularly, but states has been fairly well controlled at other appts. She was seen in the ER in june for presumably food poisoning and is now on potassium supplement as she had hypokalemia. She is unsure of her medications today, and later called our office to reconcile them.   She does one episode a few weeks ago of chest pressure that radiated to her shoulder. Occurred while resting. She did not have any nitroglycerin, but took some aspirin and symptoms improved after 30 minutes.   Urinary incontinence is unchanged. She has not noticed any worsening shortness of breath. Leg edema has remained stable. Unfortunately, she continues to smoke 1/2 pack of cigarettes daily.  Past Medical History:  Diagnosis Date  . Anemia  12/26/2015  . Anxiety   . Arthritis   . Blood transfusion without reported diagnosis   . Chronic pain   . COPD (chronic obstructive pulmonary disease) ([place])   . Deep vein blood clot of right lower extremity ([place]) 12/29/2012   Dr. Nadyne Coombes, stent in place  . Depression   . Diabetes mellitus without complication ([place])   . GERD (gastroesophageal reflux disease)   . Heart murmur   . Hyperlipemia   . Hypertension   . Sleep apnea    uses a cpap  . Wears glasses     Past Surgical History:  Procedure Laterality Date  . ABDOMINAL HYSTERECTOMY    . CARDIAC CATHETERIZATION  4/15  . CATARACT EXTRACTION    . CESAREAN SECTION    . COLONOSCOPY N/A 01/08/2013   Procedure: COLONOSCOPY;  Surgeon: Beryle Beams, MD;  Location: [facility] ENDOSCOPY;  Service: Endoscopy;  Laterality: N/A;  . COLONOSCOPY N/A 12/28/2015   Procedure: COLONOSCOPY;  Surgeon: Carol Ada, MD;  Location: [facility] ENDOSCOPY;  Service: Endoscopy;  Laterality: N/A;  . ENTEROSCOPY N/A 12/28/2015   Procedure: ENTEROSCOPY;  Surgeon: Carol Ada, MD;  Location: [place];  Service: Endoscopy;  Laterality: N/A;  . ENTEROSCOPY N/A 02/20/2018   Procedure: ENTEROSCOPY;  Surgeon: Carol Ada, MD;  Location: [facility] ENDOSCOPY;  Service: Endoscopy;  Laterality: N/A;  . ENTEROSCOPY N/A 03/27/2018   Procedure: ENTEROSCOPY;  Surgeon: Carol Ada, MD;  Location: [facility] ENDOSCOPY;  Service: Endoscopy;  Laterality: N/A;  . ESOPHAGOGASTRODUODENOSCOPY N/A 01/08/2013   Procedure:  ESOPHAGOGASTRODUODENOSCOPY (EGD);  Surgeon: Beryle Beams, MD;  Location: [facility] ENDOSCOPY;  Service: Endoscopy;  Laterality: N/A;  . EYE SURGERY    . HAND SURGERY    . HOT HEMOSTASIS N/A 12/28/2015   Procedure: HOT HEMOSTASIS (ARGON PLASMA COAGULATION/BICAP);  Surgeon: Carol Ada, MD;  Location: [facility] ENDOSCOPY;  Service: Endoscopy;  Laterality: N/A;  . HOT HEMOSTASIS N/A 02/20/2018   Procedure: HOT HEMOSTASIS (ARGON PLASMA COAGULATION/BICAP);  Surgeon: Carol Ada, MD;  Location: [facility] ENDOSCOPY;   Service: Endoscopy;  Laterality: N/A;  . HOT HEMOSTASIS N/A 03/27/2018   Procedure: HOT HEMOSTASIS (ARGON PLASMA COAGULATION/BICAP);  Surgeon: Carol Ada, MD;  Location: [facility] ENDOSCOPY;  Service: Endoscopy;  Laterality: N/A;  . LEFT HEART CATHETERIZATION WITH CORONARY ANGIOGRAM N/A 03/09/2013   Procedure: LEFT HEART CATHETERIZATION WITH CORONARY ANGIOGRAM;  Surgeon: Laverda Page, MD;  Location: [facility] CATH LAB;  Service: Cardiovascular;  Laterality: N/A;  . LOWER EXTREMITY ANGIOGRAM N/A 12/29/2012   Procedure: LOWER EXTREMITY ANGIOGRAM;  Surgeon: Laverda Page, MD;  Location: [facility] CATH LAB;  Service: Cardiovascular;  Laterality: N/A;  . LOWER EXTREMITY ANGIOGRAM N/A 10/05/2013   Procedure: LOWER EXTREMITY ANGIOGRAM;  Surgeon: Laverda Page, MD;  Location: [facility] CATH LAB;  Service: Cardiovascular;  Laterality: N/A;  . LOWER EXTREMITY ANGIOGRAPHY N/A 02/11/2017   Procedure: Lower Extremity Angiography;  Surgeon: Adrian Prows, MD;  Location: [place] CV LAB;  Service: Cardiovascular;  Laterality: N/A;  . PERIPHERAL VASCULAR BALLOON ANGIOPLASTY  02/11/2017   Procedure: PERIPHERAL VASCULAR BALLOON ANGIOPLASTY;  Surgeon: Adrian Prows, MD;  Location: [place] CV LAB;  Service: Cardiovascular;;  Right SFA  . PERIPHERAL VASCULAR INTERVENTION Right 02/11/2017   Procedure: PERIPHERAL VASCULAR INTERVENTION;  Surgeon: Adrian Prows, MD;  Location: [place] CV LAB;  Service: Cardiovascular;  Laterality: Right;  Rt SFA  . stent in right leg  12/29/2012   for blood clot, Dr. Nadyne Coombes  . TYMPANOMASTOIDECTOMY Left 03/08/2014   Procedure: TYMPANOMASTOIDECTOMY LEFT;  Surgeon: Ascencion Dike, MD;  Location: [place];  Service: ENT;  Laterality: Left;  . TYMPANOMASTOIDECTOMY  2015  . UTERINE FIBROID SURGERY      Social History   Socioeconomic History  . Marital status: Divorced    Spouse name: Not on file  . Number of children: 3  . Years of education: Not on file  . Highest education level: Not on  file  Occupational History  . Not on file  Social Needs  . Financial resource strain: Not on file  . Food insecurity    Worry: Not on file    Inability: Not on file  . Transportation needs    Medical: Not on file    Non-medical: Not on file  Tobacco Use  . Smoking status: Current Every Day Smoker    Packs/day: 0.50    Years: 41.00    Pack years: 20.50    Types: Cigarettes  . Smokeless tobacco: Never Used  Substance and Sexual Activity  . Alcohol use: Yes    Comment: rarely   . Drug use: Yes    Types: Marijuana    Comment: last-02/24/14  . Sexual activity: Not on file  Lifestyle  . Physical activity    Days per week: Not on file    Minutes per session: Not on file  . Stress: Not on file  Relationships  . Social Herbalist on phone: Not on file    Gets together: Not on file    Attends religious service: Not  on file    Active member of club or organization: Not on file    Attends meetings of clubs or organizations: Not on file    Relationship status: Not on file  . Intimate partner violence    Fear of current or ex partner: Not on file    Emotionally abused: Not on file    Physically abused: Not on file    Forced sexual activity: Not on file  Other Topics Concern  . Not on file  Social History Narrative  . Not on file    Review of Systems  Constitution: Negative for decreased appetite, malaise/fatigue, weight gain and weight loss.  Eyes: Negative for visual disturbance.  Cardiovascular: Positive for dyspnea on exertion (chronic, stable). Negative for chest pain, claudication, leg swelling, orthopnea, palpitations and syncope.  Respiratory: Negative for hemoptysis and wheezing.   Endocrine: Negative for cold intolerance and heat intolerance.  Hematologic/Lymphatic: Does not bruise/bleed easily.  Skin: Negative for nail changes.  Musculoskeletal: Negative for muscle weakness and myalgias.  Gastrointestinal: Negative for abdominal pain, change in bowel  habit, nausea and vomiting.  Genitourinary: Positive for bladder incontinence.  Neurological: Negative for difficulty with concentration, dizziness, focal weakness and headaches.  Psychiatric/Behavioral: Negative for altered mental status and suicidal ideas.  All other systems reviewed and are negative.     Objective:  Blood pressure (!) 147/92, pulse 62, temperature 99.1 F (37.3 C), height 5' (1.524 m), weight 206 lb (93.4 kg), SpO2 92 %. Body mass index is 40.23 kg/m.    Physical Exam  Constitutional: She is oriented to person, place, and time. Vital signs are normal. She appears well-developed and well-nourished.  HENT:  Head: Normocephalic and atraumatic.  Neck: Normal range of motion. Neck supple. No tracheal deviation present. No thyromegaly present.  Cardiovascular: Normal rate, regular rhythm and intact distal pulses.  Murmur heard.  Blowing early systolic murmur is present with a grade of 2/6 at the upper right sternal border radiating to the neck. Pulses:      Carotid pulses are on the right side with bruit and on the left side with bruit.      Femoral pulses are 1+ on the right side and 1+ on the left side.      Popliteal pulses are 1+ on the right side and 1+ on the left side.       Dorsalis pedis pulses are 2+ on the right side and 1+ on the left side.       Posterior tibial pulses are 0 on the right side and 1+ on the left side.  Pulses difficult to palpate due to bodily habitus.  Pulmonary/Chest: Effort normal and breath sounds normal. No accessory muscle usage. No respiratory distress.  Abdominal: Soft. Bowel sounds are normal.  Musculoskeletal: Normal range of motion.  Neurological: She is alert and oriented to person, place, and time.  Skin: Skin is warm and dry.  Psychiatric: She has a normal mood and affect.  Vitals reviewed.  Radiology: No results found.  Laboratory examination:    CMP Latest Ref Rng & Units 12/21/2018 09/07/2018 06/01/2018  Glucose 70 -  99 mg/dL 119(H) 81 93  BUN 8 - 23 mg/dL 13 17 9   Creatinine 0.44 - 1.00 mg/dL 0.96 1.01(H) 0.80  Sodium 135 - 145 mmol/L 141 140 138  Potassium 3.5 - 5.1 mmol/L 3.0(L) 4.1 3.8  Chloride 98 - 111 mmol/L 104 101 99  CO2 22 - 32 mmol/L 27 25 28   Calcium 8.9 - 10.3  mg/dL 9.4 9.9 9.7  Total Protein 6.5 - 8.1 g/dL 7.2 - -  Total Bilirubin 0.3 - 1.2 mg/dL 0.8 - -  Alkaline Phos 38 - 126 U/L 71 - -  AST 15 - 41 U/L 19 - -  ALT 0 - 44 U/L 10 - -   CBC Latest Ref Rng & Units 12/21/2018 12/21/2018 06/01/2018  WBC 4.0 - 10.5 K/uL 6.9 7.1 7.2  Hemoglobin 12.0 - 15.0 g/dL 14.9 14.7 14.0  Hematocrit 36.0 - 46.0 % 46.1(H) 45.8 46.7(H)  Platelets 150 - 400 K/uL 351 354 418(H)   Lipid Panel     Component Value Date/Time   CHOL 166 05/12/2012 0237   TRIG 196 (H) 05/12/2012 0237   HDL 37 (L) 05/12/2012 0237   CHOLHDL 4.5 05/12/2012 0237   VLDL 39 05/12/2012 0237   LDLCALC 90 05/12/2012 0237   HEMOGLOBIN A1C Lab Results  Component Value Date   HGBA1C 5.6 12/26/2015   MPG 114 12/26/2015   TSH No results for input(s): TSH in the last 8760 hours.  PRN Meds:. Medications Discontinued During This Encounter  Medication Reason  . clopidogrel (PLAVIX) 75 MG tablet    Current Meds  Medication Sig  . albuterol (PROVENTIL HFA;VENTOLIN HFA) 108 (90 Base) MCG/ACT inhaler Inhale 1-2 puffs into the lungs every 6 (six) hours as needed for wheezing or shortness of breath.  Marland Kitchen aspirin EC 81 MG tablet Take 81 mg by mouth daily.   Marland Kitchen gabapentin (NEURONTIN) 600 MG tablet Take 1 tablet (600 mg total) by mouth at bedtime. (Patient taking differently: Take 600 mg by mouth at bedtime as needed (pain). )  . hydrALAZINE (APRESOLINE) 50 MG tablet Take 1 tablet (50 mg total) by mouth 3 (three) times daily.  Marland Kitchen loperamide (IMODIUM) 2 MG capsule Take 1 capsule (2 mg total) by mouth 4 (four) times daily as needed for diarrhea or loose stools.  Marland Kitchen losartan-hydrochlorothiazide (HYZAAR) 100-25 MG tablet Take 1 tablet by mouth  daily.  . meloxicam (MOBIC) 15 MG tablet 15 mg daily.  . metFORMIN (GLUCOPHAGE) 500 MG tablet Take 0.5 tablets (250 mg total) by mouth 2 (two) times daily with a meal.  . nitroGLYCERIN (NITROSTAT) 0.4 MG SL tablet Place 1 tablet (0.4 mg total) under the tongue every 5 (five) minutes as needed for chest pain.  . pantoprazole (PROTONIX) 40 MG tablet 40 mg daily.  . potassium chloride SA (K-DUR) 20 MEQ tablet Take 1 tablet (20 mEq total) by mouth 2 (two) times daily.  . pravastatin (PRAVACHOL) 20 MG tablet Take 20 mg by mouth daily.  . valsartan-hydrochlorothiazide (DIOVAN-HCT) 320-12.5 MG tablet Take 1 tablet by mouth daily.  . [DISCONTINUED] atorvastatin (LIPITOR) 40 MG tablet Take 40 mg by mouth daily.    Cardiac Studies:   Echocardiogram 05/09/2017: Left ventricle cavity is normal in size. Mild concentric hypertrophy of the left ventricle. Normal global wall motion. Doppler evidence of grade I (impaired) diastolic dysfunction, normal LAP. LVEF 55-60%. Mild aortic valve stenosis. No significant change compared to study in 2016. Pulmonary hypertension noted in 2016 not present on this echocardiogram.  Exercise myoview stress 05/19/2017: 1. Patient achieved 4.4 METS and reached 83% target heart rate. Exercise capacity low for age. Appropriate heart rate and blood pressure response. Stress symptoms included dyspnea, dizziness. Submaximal exercise stress study. 2. No stress EKG changes suggestive of ischemia. 3. Stress and rest SPECT images demonstrate homogeneous tracer distribution throughout the myocardium. Gated SPECT imaging reveals normal myocardial thickening and wall motion. The left  ventricular ejection fraction was normal (65%).  4. This is a low risk submaximal exercise stress study   Peripheral arteriogram 02/11/2017: Successful PTA with DCB 5x150x2 InPact Admiral in the right proximal to distal SFA and stenting of proximal SFA with DES, [place] 6x100 mm stent. 3 vessel r/o. Left  mild disease and 2 vessel R/O. Mild disease left by angiogram 10/05/2013. Lower extremity arterial duplex 04/15/2018: No hemodynamically significant stenoses are identified in the lower extremity arterial system. Mild diffuse calcific plaque noted. This exam reveals mildly decreased perfusion of the right lower extremity, noted at the anterior tibial artery level (ABI 0.86) and normal perfusion of the left lower extremity (ABI 0.97). No signficant change since 05/28/2017. Study suggests patent right SFA angioplasty site.  Lower extremity arterial duplex 04/15/2018: No hemodynamically significant stenoses are identified in the lower extremity arterial system. Mild diffuse calcific plaque noted. This exam reveals mildly decreased perfusion of the right lower extremity, noted at the anterior tibial artery level (ABI 0.86) and normal perfusion of the left lower extremity (ABI 0.97). No signficant change since 05/28/2017. Study suggests patent right SFA angioplasty site.  Carotid artery duplex 07/25/2015: Antegrade right vertebral artery flow. Antegrade left vertebral artery flow. No hemodynamically significant arterial disease in the internal carotid artery bilaterally. No significant change from 11/26/2012.  Coronary angiogram 02/27/2014: Mid RCA 60-70% stenosis, mid LAD 60-70% stenosis. FFR to the LAD, lesion insignificant. Medical therapy for CAD. Moderate aortic valve calcification.  Assessment:     ICD-10-CM   1. Essential hypertension  I10 EKG 12-Lead  2. Angina pectoris (HCC)  I20.9   3. Type 2 diabetes mellitus with peripheral vascular disease (HCC)  E11.51   4. Tobacco use disorder  F17.200   5. Mild aortic stenosis  I35.0 PCV ECHOCARDIOGRAM COMPLETE    EKG 01/13/2019: Normal sinus rhythm at 66 bpm with frequent PAC's, normal axis, PRWP cannot exclude anterior infarct old. No evidence of ischemia.   Recommendations:   Patient is presently doing well, she does report one episode of  chest pain that is concerning for angina; however, she has remained active and has not had any recurrence. I will send in her prescription for nitroglycerin and advised her to contact me if she has any recurrence. Blood pressure is elevated today. She was unsure of her medications, but later called the office to reconcile these. She is found to not be on Aldactone, I will resume this. Will discontinue potassium supplement that was given during acute illness. Will recheck BMP in 2 weeks for surveillance.   In regard to PAD, symptoms of claudication have remained stable and minimal. LE arterial duplex has remained stable. She is also found to not be on Plavix, will not resume as it has been 2 years since intervention and has remained stable. She will continue with ASA indefinitely.   She has mild AS that is asymptomatic. No changes to aortic murmur by exam. I will repeat echo before her next office visit as it has been 2 years since her last echo.   I have again encouraged her to work to quit smoking. She did not have success with Chantix or nicotine patches. Question if wellbutrin would be a good option. She will discuss with her PCP at her follow up tomorrow. I will see her back in 3 months for follow up.    Miquel Dunn, MSN, APRN, FNP-C [facility]. [place] Office: [phone] Fax: [phone]

## 2019-01-15 DIAGNOSIS — F17200 Nicotine dependence, unspecified, uncomplicated: Secondary | ICD-10-CM | POA: Diagnosis not present

## 2019-01-15 DIAGNOSIS — K219 Gastro-esophageal reflux disease without esophagitis: Secondary | ICD-10-CM | POA: Diagnosis not present

## 2019-01-15 DIAGNOSIS — E1165 Type 2 diabetes mellitus with hyperglycemia: Secondary | ICD-10-CM | POA: Diagnosis not present

## 2019-01-15 DIAGNOSIS — E1140 Type 2 diabetes mellitus with diabetic neuropathy, unspecified: Secondary | ICD-10-CM | POA: Diagnosis not present

## 2019-01-15 DIAGNOSIS — J449 Chronic obstructive pulmonary disease, unspecified: Secondary | ICD-10-CM | POA: Diagnosis not present

## 2019-01-15 DIAGNOSIS — E7800 Pure hypercholesterolemia, unspecified: Secondary | ICD-10-CM | POA: Diagnosis not present

## 2019-01-15 DIAGNOSIS — I10 Essential (primary) hypertension: Secondary | ICD-10-CM | POA: Diagnosis not present

## 2019-01-15 DIAGNOSIS — F329 Major depressive disorder, single episode, unspecified: Secondary | ICD-10-CM | POA: Diagnosis not present

## 2019-01-15 DIAGNOSIS — G44219 Episodic tension-type headache, not intractable: Secondary | ICD-10-CM | POA: Diagnosis not present

## 2019-01-20 ENCOUNTER — Encounter: Payer: Self-pay | Admitting: Cardiology

## 2019-01-20 ENCOUNTER — Ambulatory Visit: Payer: Medicare Other | Admitting: Cardiology

## 2019-01-20 MED ORDER — SPIRONOLACTONE 25 MG PO TABS: 25.0000 mg | ORAL_TABLET | Freq: Every day | ORAL | 1 refills | Status: DC

## 2019-01-22 ENCOUNTER — Telehealth: Payer: Self-pay

## 2019-01-22 NOTE — Telephone Encounter (Signed)
Spoke with patient. Advised her to start Aldactone and stop potassium. Also to have blood work  in2 weeks

## 2019-02-08 ENCOUNTER — Other Ambulatory Visit: Payer: Self-pay

## 2019-02-08 ENCOUNTER — Ambulatory Visit
Admission: RE | Admit: 2019-02-08 | Discharge: 2019-02-08 | Disposition: A | Discharge status: Home / Self Care | Payer: Medicare HMO | Source: Ambulatory Visit | Attending: Internal Medicine | Admitting: Internal Medicine

## 2019-02-08 DIAGNOSIS — Z1231 Encounter for screening mammogram for malignant neoplasm of breast: Secondary | ICD-10-CM | POA: Diagnosis not present

## 2019-02-17 ENCOUNTER — Other Ambulatory Visit: Payer: Self-pay

## 2019-02-17 ENCOUNTER — Ambulatory Visit (INDEPENDENT_AMBULATORY_CARE_PROVIDER_SITE_OTHER): Payer: Medicare HMO

## 2019-02-17 DIAGNOSIS — I350 Nonrheumatic aortic (valve) stenosis: Secondary | ICD-10-CM | POA: Diagnosis not present

## 2019-03-03 DIAGNOSIS — L602 Onychogryphosis: Secondary | ICD-10-CM | POA: Diagnosis not present

## 2019-03-03 DIAGNOSIS — M7662 Achilles tendinitis, left leg: Secondary | ICD-10-CM | POA: Diagnosis not present

## 2019-03-03 DIAGNOSIS — R69 Illness, unspecified: Secondary | ICD-10-CM | POA: Diagnosis not present

## 2019-03-03 DIAGNOSIS — E1151 Type 2 diabetes mellitus with diabetic peripheral angiopathy without gangrene: Secondary | ICD-10-CM | POA: Diagnosis not present

## 2019-03-03 DIAGNOSIS — M7732 Calcaneal spur, left foot: Secondary | ICD-10-CM | POA: Diagnosis not present

## 2019-03-16 DIAGNOSIS — M7662 Achilles tendinitis, left leg: Secondary | ICD-10-CM | POA: Diagnosis not present

## 2019-03-16 DIAGNOSIS — Z6841 Body Mass Index (BMI) 40.0 and over, adult: Secondary | ICD-10-CM | POA: Diagnosis not present

## 2019-03-16 DIAGNOSIS — M79605 Pain in left leg: Secondary | ICD-10-CM | POA: Diagnosis not present

## 2019-03-16 DIAGNOSIS — M6281 Muscle weakness (generalized): Secondary | ICD-10-CM | POA: Diagnosis not present

## 2019-03-16 DIAGNOSIS — R262 Difficulty in walking, not elsewhere classified: Secondary | ICD-10-CM | POA: Diagnosis not present

## 2019-03-23 DIAGNOSIS — R262 Difficulty in walking, not elsewhere classified: Secondary | ICD-10-CM | POA: Diagnosis not present

## 2019-03-23 DIAGNOSIS — M7662 Achilles tendinitis, left leg: Secondary | ICD-10-CM | POA: Diagnosis not present

## 2019-03-23 DIAGNOSIS — M79605 Pain in left leg: Secondary | ICD-10-CM | POA: Diagnosis not present

## 2019-03-23 DIAGNOSIS — M6281 Muscle weakness (generalized): Secondary | ICD-10-CM | POA: Diagnosis not present

## 2019-03-29 DIAGNOSIS — R262 Difficulty in walking, not elsewhere classified: Secondary | ICD-10-CM | POA: Diagnosis not present

## 2019-03-29 DIAGNOSIS — M6281 Muscle weakness (generalized): Secondary | ICD-10-CM | POA: Diagnosis not present

## 2019-03-29 DIAGNOSIS — M79605 Pain in left leg: Secondary | ICD-10-CM | POA: Diagnosis not present

## 2019-03-29 DIAGNOSIS — M7662 Achilles tendinitis, left leg: Secondary | ICD-10-CM | POA: Diagnosis not present

## 2019-03-31 DIAGNOSIS — M7662 Achilles tendinitis, left leg: Secondary | ICD-10-CM | POA: Diagnosis not present

## 2019-03-31 DIAGNOSIS — M7732 Calcaneal spur, left foot: Secondary | ICD-10-CM | POA: Diagnosis not present

## 2019-03-31 DIAGNOSIS — E1151 Type 2 diabetes mellitus with diabetic peripheral angiopathy without gangrene: Secondary | ICD-10-CM | POA: Diagnosis not present

## 2019-04-12 DIAGNOSIS — E1140 Type 2 diabetes mellitus with diabetic neuropathy, unspecified: Secondary | ICD-10-CM | POA: Diagnosis not present

## 2019-04-12 DIAGNOSIS — J0100 Acute maxillary sinusitis, unspecified: Secondary | ICD-10-CM | POA: Diagnosis not present

## 2019-04-12 DIAGNOSIS — G44219 Episodic tension-type headache, not intractable: Secondary | ICD-10-CM | POA: Diagnosis not present

## 2019-04-12 DIAGNOSIS — I10 Essential (primary) hypertension: Secondary | ICD-10-CM | POA: Diagnosis not present

## 2019-04-12 DIAGNOSIS — E1165 Type 2 diabetes mellitus with hyperglycemia: Secondary | ICD-10-CM | POA: Diagnosis not present

## 2019-04-12 DIAGNOSIS — F329 Major depressive disorder, single episode, unspecified: Secondary | ICD-10-CM | POA: Diagnosis not present

## 2019-04-12 DIAGNOSIS — E7800 Pure hypercholesterolemia, unspecified: Secondary | ICD-10-CM | POA: Diagnosis not present

## 2019-04-12 DIAGNOSIS — J449 Chronic obstructive pulmonary disease, unspecified: Secondary | ICD-10-CM | POA: Diagnosis not present

## 2019-04-12 DIAGNOSIS — F17200 Nicotine dependence, unspecified, uncomplicated: Secondary | ICD-10-CM | POA: Diagnosis not present

## 2019-04-20 ENCOUNTER — Telehealth: Payer: Self-pay

## 2019-04-20 ENCOUNTER — Other Ambulatory Visit: Payer: Self-pay

## 2019-04-20 ENCOUNTER — Ambulatory Visit (INDEPENDENT_AMBULATORY_CARE_PROVIDER_SITE_OTHER): Payer: Medicare HMO | Admitting: Cardiology

## 2019-04-20 ENCOUNTER — Encounter: Payer: Self-pay | Admitting: Cardiology

## 2019-04-20 VITALS — BP 127/83 | HR 60 | Temp 97.6°F | Ht 60.0 in | Wt 203.0 lb

## 2019-04-20 DIAGNOSIS — I350 Nonrheumatic aortic (valve) stenosis: Secondary | ICD-10-CM | POA: Diagnosis not present

## 2019-04-20 DIAGNOSIS — F17200 Nicotine dependence, unspecified, uncomplicated: Secondary | ICD-10-CM | POA: Diagnosis not present

## 2019-04-20 DIAGNOSIS — I10 Essential (primary) hypertension: Secondary | ICD-10-CM | POA: Diagnosis not present

## 2019-04-20 NOTE — Telephone Encounter (Signed)
Pt called and we went over her med list. She is concerned about her urinating so much. She wanted to know if she could d/c losartan hctz, please advise?

## 2019-04-20 NOTE — Progress Notes (Signed)
Primary Physician:  Benito Mccreedy, MD   Patient ID: [name], female    DOB: 16-Jan-1951, 68 y.o.   MRN: [id number]  Subjective:    Chief Complaint  Patient presents with  . Hypertension    follow up    HPI: [name]  is a 68 y.o. female  with hypertension, hyperlipidemia, moderate CAD being treated medically and known peripheral arterial disease s/p PV angiogram with revascularization of SFA and has not had reoccurence of claudication, history of prior GI bleed in May 2014 and again in July 4th 2017, Upper GI and lower GI evaluation revealed multiple non-bleeding AVMs in the jejunum and colonic polyps and diverticular disease - Dr. Benson Norway. . She has not had any further recurrence of GI bleed. She underwent exercise nuclear stress test in which her excess capacity was low on 05/19/2017 and is no evidence of ischemia and she achieved only 83% of MPHR. Also underwent LE arterial duplex on 04/15/2018, revealing right ABI has improved from 0.5 to 0.86, suggesting patent right SFA angioplasty site.  She was last seen in July, blood pressure was noted to be markedly elevated and she was started back on Aldactone.  She did not have labs performed.  She reported one episode concerning for angina at that time.  She now presents for follow-up.  She is doing well without any complaints today.  She has not been monitoring her blood pressure regularly, but did check her blood pressure a few days ago and states 120/70.  No recurrence of chest discomfort.  Urinary incontinence is unchanged. She has not noticed any worsening shortness of breath. Leg edema has remained stable. Unfortunately, she continues to smoke 1/2 pack of cigarettes daily.  Past Medical History:  Diagnosis Date  . Anemia 12/26/2015  . Anxiety   . Arthritis   . Blood transfusion without reported diagnosis   . Chronic pain   . COPD (chronic obstructive pulmonary disease) ([place])   . Deep vein blood  clot of right lower extremity ([place]) 12/29/2012   Dr. Nadyne Coombes, stent in place  . Depression   . Diabetes mellitus without complication ([place])   . GERD (gastroesophageal reflux disease)   . Heart murmur   . Hyperlipemia   . Hypertension   . Sleep apnea    uses a cpap  . Wears glasses     Past Surgical History:  Procedure Laterality Date  . ABDOMINAL HYSTERECTOMY    . CARDIAC CATHETERIZATION  4/15  . CATARACT EXTRACTION    . CESAREAN SECTION    . COLONOSCOPY N/A 01/08/2013   Procedure: COLONOSCOPY;  Surgeon: Beryle Beams, MD;  Location: [facility] ENDOSCOPY;  Service: Endoscopy;  Laterality: N/A;  . COLONOSCOPY N/A 12/28/2015   Procedure: COLONOSCOPY;  Surgeon: Carol Ada, MD;  Location: [facility] ENDOSCOPY;  Service: Endoscopy;  Laterality: N/A;  . ENTEROSCOPY N/A 12/28/2015   Procedure: ENTEROSCOPY;  Surgeon: Carol Ada, MD;  Location: [place];  Service: Endoscopy;  Laterality: N/A;  . ENTEROSCOPY N/A 02/20/2018   Procedure: ENTEROSCOPY;  Surgeon: Carol Ada, MD;  Location: [facility] ENDOSCOPY;  Service: Endoscopy;  Laterality: N/A;  . ENTEROSCOPY N/A 03/27/2018   Procedure: ENTEROSCOPY;  Surgeon: Carol Ada, MD;  Location: [facility] ENDOSCOPY;  Service: Endoscopy;  Laterality: N/A;  . ESOPHAGOGASTRODUODENOSCOPY N/A 01/08/2013   Procedure: ESOPHAGOGASTRODUODENOSCOPY (EGD);  Surgeon: Beryle Beams, MD;  Location: [facility];  Service: Endoscopy;  Laterality: N/A;  . EYE SURGERY    . HAND SURGERY    . HOT  HEMOSTASIS N/A 12/28/2015   Procedure: HOT HEMOSTASIS (ARGON PLASMA COAGULATION/BICAP);  Surgeon: Carol Ada, MD;  Location: [facility] ENDOSCOPY;  Service: Endoscopy;  Laterality: N/A;  . HOT HEMOSTASIS N/A 02/20/2018   Procedure: HOT HEMOSTASIS (ARGON PLASMA COAGULATION/BICAP);  Surgeon: Carol Ada, MD;  Location: [facility] ENDOSCOPY;  Service: Endoscopy;  Laterality: N/A;  . HOT HEMOSTASIS N/A 03/27/2018   Procedure: HOT HEMOSTASIS (ARGON PLASMA COAGULATION/BICAP);  Surgeon: Carol Ada, MD;  Location: [facility]  ENDOSCOPY;  Service: Endoscopy;  Laterality: N/A;  . LEFT HEART CATHETERIZATION WITH CORONARY ANGIOGRAM N/A 03/09/2013   Procedure: LEFT HEART CATHETERIZATION WITH CORONARY ANGIOGRAM;  Surgeon: Laverda Page, MD;  Location: [facility] CATH LAB;  Service: Cardiovascular;  Laterality: N/A;  . LOWER EXTREMITY ANGIOGRAM N/A 12/29/2012   Procedure: LOWER EXTREMITY ANGIOGRAM;  Surgeon: Laverda Page, MD;  Location: [facility] CATH LAB;  Service: Cardiovascular;  Laterality: N/A;  . LOWER EXTREMITY ANGIOGRAM N/A 10/05/2013   Procedure: LOWER EXTREMITY ANGIOGRAM;  Surgeon: Laverda Page, MD;  Location: [facility] CATH LAB;  Service: Cardiovascular;  Laterality: N/A;  . LOWER EXTREMITY ANGIOGRAPHY N/A 02/11/2017   Procedure: Lower Extremity Angiography;  Surgeon: Adrian Prows, MD;  Location: [place] CV LAB;  Service: Cardiovascular;  Laterality: N/A;  . PERIPHERAL VASCULAR BALLOON ANGIOPLASTY  02/11/2017   Procedure: PERIPHERAL VASCULAR BALLOON ANGIOPLASTY;  Surgeon: Adrian Prows, MD;  Location: [place] CV LAB;  Service: Cardiovascular;;  Right SFA  . PERIPHERAL VASCULAR INTERVENTION Right 02/11/2017   Procedure: PERIPHERAL VASCULAR INTERVENTION;  Surgeon: Adrian Prows, MD;  Location: [place] CV LAB;  Service: Cardiovascular;  Laterality: Right;  Rt SFA  . stent in right leg  12/29/2012   for blood clot, Dr. Nadyne Coombes  . TYMPANOMASTOIDECTOMY Left 03/08/2014   Procedure: TYMPANOMASTOIDECTOMY LEFT;  Surgeon: Ascencion Dike, MD;  Location: [place];  Service: ENT;  Laterality: Left;  . TYMPANOMASTOIDECTOMY  2015  . UTERINE FIBROID SURGERY      Social History   Socioeconomic History  . Marital status: Divorced    Spouse name: Not on file  . Number of children: 3  . Years of education: Not on file  . Highest education level: Not on file  Occupational History  . Not on file  Social Needs  . Financial resource strain: Not on file  . Food insecurity    Worry: Not on file    Inability: Not on file  .  Transportation needs    Medical: Not on file    Non-medical: Not on file  Tobacco Use  . Smoking status: Current Every Day Smoker    Packs/day: 0.50    Years: 41.00    Pack years: 20.50    Types: Cigarettes  . Smokeless tobacco: Never Used  Substance and Sexual Activity  . Alcohol use: Yes    Comment: rarely   . Drug use: Yes    Types: Marijuana    Comment: last-02/24/14  . Sexual activity: Not on file  Lifestyle  . Physical activity    Days per week: Not on file    Minutes per session: Not on file  . Stress: Not on file  Relationships  . Social Herbalist on phone: Not on file    Gets together: Not on file    Attends religious service: Not on file    Active member of club or organization: Not on file    Attends meetings of clubs or organizations: Not on file    Relationship status: Not on  file  . Intimate partner violence    Fear of current or ex partner: Not on file    Emotionally abused: Not on file    Physically abused: Not on file    Forced sexual activity: Not on file  Other Topics Concern  . Not on file  Social History Narrative  . Not on file    Review of Systems  Constitution: Negative for decreased appetite, malaise/fatigue, weight gain and weight loss.  Eyes: Negative for visual disturbance.  Cardiovascular: Positive for dyspnea on exertion (chronic, stable). Negative for chest pain, claudication, leg swelling, orthopnea, palpitations and syncope.  Respiratory: Negative for hemoptysis and wheezing.   Endocrine: Negative for cold intolerance and heat intolerance.  Hematologic/Lymphatic: Does not bruise/bleed easily.  Skin: Negative for nail changes.  Musculoskeletal: Negative for muscle weakness and myalgias.  Gastrointestinal: Negative for abdominal pain, change in bowel habit, nausea and vomiting.  Genitourinary: Positive for bladder incontinence.  Neurological: Positive for sensory change (occasional tingling sensation in her right thigh).  Negative for difficulty with concentration, dizziness, focal weakness and headaches.  Psychiatric/Behavioral: Negative for altered mental status and suicidal ideas.  All other systems reviewed and are negative.     Objective:  Blood pressure 127/83, pulse 60, temperature 97.6 F (36.4 C), height 5' (1.524 m), weight 203 lb (92.1 kg), SpO2 90 %. Body mass index is 39.65 kg/m.    Physical Exam  Constitutional: She is oriented to person, place, and time. Vital signs are normal. She appears well-developed and well-nourished.  HENT:  Head: Normocephalic and atraumatic.  Neck: Normal range of motion. Neck supple. No tracheal deviation present. No thyromegaly present.  Cardiovascular: Normal rate, regular rhythm and intact distal pulses.  Murmur heard.  Blowing early systolic murmur is present with a grade of 2/6 at the upper right sternal border radiating to the neck. Pulses:      Carotid pulses are on the right side with bruit and on the left side with bruit.      Femoral pulses are 1+ on the right side and 1+ on the left side.      Popliteal pulses are 1+ on the right side and 1+ on the left side.       Dorsalis pedis pulses are 2+ on the right side and 1+ on the left side.       Posterior tibial pulses are 0 on the right side and 1+ on the left side.  Pulses difficult to palpate due to bodily habitus.  Pulmonary/Chest: Effort normal and breath sounds normal. No accessory muscle usage. No respiratory distress.  Abdominal: Soft. Bowel sounds are normal.  Musculoskeletal: Normal range of motion.  Neurological: She is alert and oriented to person, place, and time.  Skin: Skin is warm and dry.  Psychiatric: She has a normal mood and affect.  Vitals reviewed.  Radiology: No results found.  Laboratory examination:    CMP Latest Ref Rng & Units 12/21/2018 09/07/2018 06/01/2018  Glucose 70 - 99 mg/dL 119(H) 81 93  BUN 8 - 23 mg/dL 13 17 9   Creatinine 0.44 - 1.00 mg/dL 0.96 1.01(H) 0.80   Sodium 135 - 145 mmol/L 141 140 138  Potassium 3.5 - 5.1 mmol/L 3.0(L) 4.1 3.8  Chloride 98 - 111 mmol/L 104 101 99  CO2 22 - 32 mmol/L 27 25 28   Calcium 8.9 - 10.3 mg/dL 9.4 9.9 9.7  Total Protein 6.5 - 8.1 g/dL 7.2 - -  Total Bilirubin 0.3 - 1.2 mg/dL 0.8 - -  Alkaline Phos 38 - 126 U/L 71 - -  AST 15 - 41 U/L 19 - -  ALT 0 - 44 U/L 10 - -   CBC Latest Ref Rng & Units 12/21/2018 12/21/2018 06/01/2018  WBC 4.0 - 10.5 K/uL 6.9 7.1 7.2  Hemoglobin 12.0 - 15.0 g/dL 14.9 14.7 14.0  Hematocrit 36.0 - 46.0 % 46.1(H) 45.8 46.7(H)  Platelets 150 - 400 K/uL 351 354 418(H)   Lipid Panel     Component Value Date/Time   CHOL 166 05/12/2012 0237   TRIG 196 (H) 05/12/2012 0237   HDL 37 (L) 05/12/2012 0237   CHOLHDL 4.5 05/12/2012 0237   VLDL 39 05/12/2012 0237   LDLCALC 90 05/12/2012 0237   HEMOGLOBIN A1C Lab Results  Component Value Date   HGBA1C 5.6 12/26/2015   MPG 114 12/26/2015   TSH No results for input(s): TSH in the last 8760 hours.  PRN Meds:. There are no discontinued medications. Current Meds  Medication Sig  . albuterol (PROVENTIL HFA;VENTOLIN HFA) 108 (90 Base) MCG/ACT inhaler Inhale 1-2 puffs into the lungs every 6 (six) hours as needed for wheezing or shortness of breath.  Marland Kitchen aspirin EC 81 MG tablet Take 81 mg by mouth daily.   Marland Kitchen gabapentin (NEURONTIN) 600 MG tablet Take 1 tablet (600 mg total) by mouth at bedtime. (Patient taking differently: Take 600 mg by mouth at bedtime as needed (pain). )  . hydrALAZINE (APRESOLINE) 50 MG tablet Take 1 tablet (50 mg total) by mouth 3 (three) times daily.  Marland Kitchen loperamide (IMODIUM) 2 MG capsule Take 1 capsule (2 mg total) by mouth 4 (four) times daily as needed for diarrhea or loose stools.  Marland Kitchen losartan-hydrochlorothiazide (HYZAAR) 100-25 MG tablet Take 1 tablet by mouth daily.  . meloxicam (MOBIC) 15 MG tablet 15 mg daily.  . metFORMIN (GLUCOPHAGE) 500 MG tablet Take 0.5 tablets (250 mg total) by mouth 2 (two) times daily with a meal.   . pantoprazole (PROTONIX) 40 MG tablet 40 mg daily.  . pravastatin (PRAVACHOL) 20 MG tablet Take 20 mg by mouth daily.  Marland Kitchen spironolactone (ALDACTONE) 25 MG tablet Take 1 tablet (25 mg total) by mouth daily.    Cardiac Studies:   Echocardiogram 02/18/2019: Left ventricle cavity is normal in size. Mild concentric hypertrophy of the left ventricle. Normal LV systolic function with EF 68%. Normal global wall motion. Doppler evidence of grade I (impaired) diastolic dysfunction, normal LAP. Calculated EF 68%. Trileaflet aortic valve with mild calcification. Mild aortic valve stenosis. Aortic valve mean gradient of 12 mmHg, Vmax of 2.4  m/s. Calculated aortic valve area by continuity equation is 1.4 cm. No regurgitation. Trace mitral regurgitation. Trace pulmonic regurgitation. Mild tricuspid regurgitation. Estimated pulmonary artery systolic pressure is 32 mmHg.  Exercise myoview stress 05/19/2017: 1. Patient achieved 4.4 METS and reached 83% target heart rate. Exercise capacity low for age. Appropriate heart rate and blood pressure response. Stress symptoms included dyspnea, dizziness. Submaximal exercise stress study. 2. No stress EKG changes suggestive of ischemia. 3. Stress and rest SPECT images demonstrate homogeneous tracer distribution throughout the myocardium. Gated SPECT imaging reveals normal myocardial thickening and wall motion. The left ventricular ejection fraction was normal (65%).  4. This is a low risk submaximal exercise stress study   Peripheral arteriogram 02/11/2017: Successful PTA with DCB 5x150x2 InPact Admiral in the right proximal to distal SFA and stenting of proximal SFA with DES, [place] 6x100 mm stent. 3 vessel r/o. Left mild disease and 2 vessel R/O. Mild disease  left by angiogram 10/05/2013. Lower extremity arterial duplex 04/15/2018: No hemodynamically significant stenoses are identified in the lower extremity arterial system. Mild diffuse calcific plaque noted. This  exam reveals mildly decreased perfusion of the right lower extremity, noted at the anterior tibial artery level (ABI 0.86) and normal perfusion of the left lower extremity (ABI 0.97). No signficant change since 05/28/2017. Study suggests patent right SFA angioplasty site.  Lower extremity arterial duplex 04/15/2018: No hemodynamically significant stenoses are identified in the lower extremity arterial system. Mild diffuse calcific plaque noted. This exam reveals mildly decreased perfusion of the right lower extremity, noted at the anterior tibial artery level (ABI 0.86) and normal perfusion of the left lower extremity (ABI 0.97). No signficant change since 05/28/2017. Study suggests patent right SFA angioplasty site.  Carotid artery duplex 07/25/2015: Antegrade right vertebral artery flow. Antegrade left vertebral artery flow. No hemodynamically significant arterial disease in the internal carotid artery bilaterally. No significant change from 11/26/2012.  Coronary angiogram 02/27/2014: Mid RCA 60-70% stenosis, mid LAD 60-70% stenosis. FFR to the LAD, lesion insignificant. Medical therapy for CAD. Moderate aortic valve calcification.  Assessment:     ICD-10-CM   1. Essential hypertension  Q41 Basic metabolic panel  2. Mild aortic stenosis  I35.0   3. Tobacco use disorder  F17.200     EKG 01/13/2019: Normal sinus rhythm at 66 bpm with frequent PAC's, normal axis, PRWP cannot exclude anterior infarct old. No evidence of ischemia.   Recommendations:   Patient is here for follow-up for hypertension.  Her blood pressure as significantly improved since being back on Aldactone.  She has not had blood work performed for follow-up on kidney function, will have her do this in the next few days.  Continue with current medications.  Echocardiogram shows stable mild aortic stenosis. Normal LVEF. She will need surveillance of this.  She has not had any recurrence of chest pain since last seen by me.   She is on appropriate therapy.  Symptoms of claudication stable and very minimal.  No changes are noted to her vascular exam.  I have again stressed importance of complete smoking cessation.  She is currently only smoking 1 cigarette/day, which I have urged her to work on she appears motivated.  I will plan to see her back in 3 months for follow-up on hypertension and efforts towards encouraged her to contact me for any new or worsening problems.   Miquel Dunn, MSN, APRN, FNP-C [facility] Cardiovascular. [place] Office: [phone] Fax: [phone]

## 2019-04-21 NOTE — Telephone Encounter (Signed)
She said that he bladder is leaking very bad

## 2019-04-22 DIAGNOSIS — Z23 Encounter for immunization: Secondary | ICD-10-CM | POA: Diagnosis not present

## 2019-04-22 DIAGNOSIS — F17200 Nicotine dependence, unspecified, uncomplicated: Secondary | ICD-10-CM | POA: Diagnosis not present

## 2019-04-22 DIAGNOSIS — E1165 Type 2 diabetes mellitus with hyperglycemia: Secondary | ICD-10-CM | POA: Diagnosis not present

## 2019-04-22 DIAGNOSIS — R05 Cough: Secondary | ICD-10-CM | POA: Diagnosis not present

## 2019-04-22 DIAGNOSIS — E1140 Type 2 diabetes mellitus with diabetic neuropathy, unspecified: Secondary | ICD-10-CM | POA: Diagnosis not present

## 2019-04-22 DIAGNOSIS — G44219 Episodic tension-type headache, not intractable: Secondary | ICD-10-CM | POA: Diagnosis not present

## 2019-04-22 DIAGNOSIS — I10 Essential (primary) hypertension: Secondary | ICD-10-CM | POA: Diagnosis not present

## 2019-04-22 DIAGNOSIS — Z0001 Encounter for general adult medical examination with abnormal findings: Secondary | ICD-10-CM | POA: Diagnosis not present

## 2019-04-22 DIAGNOSIS — J449 Chronic obstructive pulmonary disease, unspecified: Secondary | ICD-10-CM | POA: Diagnosis not present

## 2019-04-22 DIAGNOSIS — F329 Major depressive disorder, single episode, unspecified: Secondary | ICD-10-CM | POA: Diagnosis not present

## 2019-05-11 DIAGNOSIS — E1151 Type 2 diabetes mellitus with diabetic peripheral angiopathy without gangrene: Secondary | ICD-10-CM | POA: Diagnosis not present

## 2019-05-11 DIAGNOSIS — L602 Onychogryphosis: Secondary | ICD-10-CM | POA: Diagnosis not present

## 2019-05-16 DIAGNOSIS — R42 Dizziness and giddiness: Secondary | ICD-10-CM | POA: Diagnosis not present

## 2019-05-16 DIAGNOSIS — R0902 Hypoxemia: Secondary | ICD-10-CM | POA: Diagnosis not present

## 2019-05-16 DIAGNOSIS — I959 Hypotension, unspecified: Secondary | ICD-10-CM | POA: Diagnosis not present

## 2019-05-18 DIAGNOSIS — F329 Major depressive disorder, single episode, unspecified: Secondary | ICD-10-CM | POA: Diagnosis not present

## 2019-05-18 DIAGNOSIS — J449 Chronic obstructive pulmonary disease, unspecified: Secondary | ICD-10-CM | POA: Diagnosis not present

## 2019-05-18 DIAGNOSIS — F17200 Nicotine dependence, unspecified, uncomplicated: Secondary | ICD-10-CM | POA: Diagnosis not present

## 2019-05-18 DIAGNOSIS — E7800 Pure hypercholesterolemia, unspecified: Secondary | ICD-10-CM | POA: Diagnosis not present

## 2019-05-18 DIAGNOSIS — E1140 Type 2 diabetes mellitus with diabetic neuropathy, unspecified: Secondary | ICD-10-CM | POA: Diagnosis not present

## 2019-05-18 DIAGNOSIS — R42 Dizziness and giddiness: Secondary | ICD-10-CM | POA: Diagnosis not present

## 2019-05-18 DIAGNOSIS — E1165 Type 2 diabetes mellitus with hyperglycemia: Secondary | ICD-10-CM | POA: Diagnosis not present

## 2019-05-18 DIAGNOSIS — I10 Essential (primary) hypertension: Secondary | ICD-10-CM | POA: Diagnosis not present

## 2019-05-18 DIAGNOSIS — G44219 Episodic tension-type headache, not intractable: Secondary | ICD-10-CM | POA: Diagnosis not present

## 2019-05-26 DIAGNOSIS — F329 Major depressive disorder, single episode, unspecified: Secondary | ICD-10-CM | POA: Diagnosis not present

## 2019-05-26 DIAGNOSIS — G44219 Episodic tension-type headache, not intractable: Secondary | ICD-10-CM | POA: Diagnosis not present

## 2019-05-26 DIAGNOSIS — E7800 Pure hypercholesterolemia, unspecified: Secondary | ICD-10-CM | POA: Diagnosis not present

## 2019-05-26 DIAGNOSIS — I10 Essential (primary) hypertension: Secondary | ICD-10-CM | POA: Diagnosis not present

## 2019-05-26 DIAGNOSIS — E1165 Type 2 diabetes mellitus with hyperglycemia: Secondary | ICD-10-CM | POA: Diagnosis not present

## 2019-05-26 DIAGNOSIS — J449 Chronic obstructive pulmonary disease, unspecified: Secondary | ICD-10-CM | POA: Diagnosis not present

## 2019-05-26 DIAGNOSIS — Z6841 Body Mass Index (BMI) 40.0 and over, adult: Secondary | ICD-10-CM | POA: Diagnosis not present

## 2019-05-26 DIAGNOSIS — K219 Gastro-esophageal reflux disease without esophagitis: Secondary | ICD-10-CM | POA: Diagnosis not present

## 2019-05-26 DIAGNOSIS — E1140 Type 2 diabetes mellitus with diabetic neuropathy, unspecified: Secondary | ICD-10-CM | POA: Diagnosis not present

## 2019-05-26 DIAGNOSIS — F17200 Nicotine dependence, unspecified, uncomplicated: Secondary | ICD-10-CM | POA: Diagnosis not present

## 2019-06-02 DIAGNOSIS — E1151 Type 2 diabetes mellitus with diabetic peripheral angiopathy without gangrene: Secondary | ICD-10-CM | POA: Diagnosis not present

## 2019-06-02 DIAGNOSIS — L602 Onychogryphosis: Secondary | ICD-10-CM | POA: Diagnosis not present

## 2019-06-02 DIAGNOSIS — L84 Corns and callosities: Secondary | ICD-10-CM | POA: Diagnosis not present

## 2019-06-05 ENCOUNTER — Emergency Department (HOSPITAL_COMMUNITY): Payer: Medicare HMO

## 2019-06-05 ENCOUNTER — Emergency Department (HOSPITAL_COMMUNITY)
Admission: EM | Admit: 2019-06-05 | Discharge: 2019-06-05 | Disposition: A | Discharge status: Home / Self Care | Payer: Medicare HMO | Attending: Emergency Medicine | Admitting: Emergency Medicine

## 2019-06-05 ENCOUNTER — Other Ambulatory Visit: Payer: Self-pay

## 2019-06-05 ENCOUNTER — Encounter (HOSPITAL_COMMUNITY): Payer: Self-pay | Admitting: *Deleted

## 2019-06-05 DIAGNOSIS — Y92810 Car as the place of occurrence of the external cause: Secondary | ICD-10-CM | POA: Diagnosis not present

## 2019-06-05 DIAGNOSIS — J449 Chronic obstructive pulmonary disease, unspecified: Secondary | ICD-10-CM | POA: Diagnosis not present

## 2019-06-05 DIAGNOSIS — Y9389 Activity, other specified: Secondary | ICD-10-CM | POA: Diagnosis not present

## 2019-06-05 DIAGNOSIS — Z7982 Long term (current) use of aspirin: Secondary | ICD-10-CM | POA: Diagnosis not present

## 2019-06-05 DIAGNOSIS — Z7984 Long term (current) use of oral hypoglycemic drugs: Secondary | ICD-10-CM | POA: Diagnosis not present

## 2019-06-05 DIAGNOSIS — E119 Type 2 diabetes mellitus without complications: Secondary | ICD-10-CM | POA: Diagnosis not present

## 2019-06-05 DIAGNOSIS — I10 Essential (primary) hypertension: Secondary | ICD-10-CM | POA: Insufficient documentation

## 2019-06-05 DIAGNOSIS — M1712 Unilateral primary osteoarthritis, left knee: Secondary | ICD-10-CM | POA: Diagnosis not present

## 2019-06-05 DIAGNOSIS — X500XXA Overexertion from strenuous movement or load, initial encounter: Secondary | ICD-10-CM | POA: Diagnosis not present

## 2019-06-05 DIAGNOSIS — Z79899 Other long term (current) drug therapy: Secondary | ICD-10-CM | POA: Diagnosis not present

## 2019-06-05 DIAGNOSIS — Y999 Unspecified external cause status: Secondary | ICD-10-CM | POA: Insufficient documentation

## 2019-06-05 DIAGNOSIS — M25562 Pain in left knee: Secondary | ICD-10-CM | POA: Diagnosis not present

## 2019-06-05 MED ORDER — MELOXICAM 7.5 MG PO TABS: 15.0000 mg | ORAL_TABLET | Freq: Every day | ORAL | 0 refills | Status: DC

## 2019-06-05 NOTE — Progress Notes (Signed)
Orthopedic Tech Progress Note Patient Details:  [name] [birth year] [id number] Pt has been told to put ACE on when she gets home. She was not able to pull her pants up on her knee. Ortho Devices Type of Ortho Device: Ace wrap, Crutches Ortho Device/Splint Location: Lt knee Ortho Device/Splint Interventions: Adjustment   Post Interventions Patient Tolerated: Well Instructions Provided: Adjustment of device, Care of device   Ladell Pier [facility] 06/05/2019, 5:16 PM

## 2019-06-05 NOTE — ED Provider Notes (Signed)
[facility] DEPT Provider Note   CSN: [id number] Arrival date & time: 06/05/19  1529     History Chief Complaint  Patient presents with  . Knee Pain    [name] is a 68 y.o. female with a past medical history of obesity who presents emergency department chief complaint of left knee pain.  Patient states that she was leaning into her car to pick up a water bottle from her floor she felt her left knee pop.  She states she felt the pain in the medial aspect of her knee anteriorly.  She is never had anything like this before.  She denies having chronic knee pain.  She denies any feelings of instability, catching or locking.  She has been able to ambulate with some pain.  HPI     Past Medical History:  Diagnosis Date  . Anemia 12/26/2015  . Anxiety   . Arthritis   . Blood transfusion without reported diagnosis   . Chronic pain   . COPD (chronic obstructive pulmonary disease) ([place])   . Deep vein blood clot of right lower extremity ([place]) 12/29/2012   Dr. Nadyne Coombes, stent in place  . Depression   . Diabetes mellitus without complication ([place])   . GERD (gastroesophageal reflux disease)   . Heart murmur   . Hyperlipemia   . Hypertension   . Sleep apnea    uses a cpap  . Wears glasses     Patient Active Problem List   Diagnosis Date Noted  . Tobacco use disorder 09/18/2018  . Iron deficiency anemia 04/09/2018  . Symptomatic anemia 12/26/2015  . Hypokalemia 12/26/2015  . Type 2 diabetes mellitus with peripheral vascular disease ([place]) 12/26/2015  . Claudication in peripheral vascular disease ([place]) 10/05/2013  . Discomfort in chest 05/11/2012  . Nicotine dependence 05/11/2012  . Bradycardia 05/11/2012  . HTN (hypertension) 05/11/2012  . Back pain 05/11/2012    Past Surgical History:  Procedure Laterality Date  . ABDOMINAL HYSTERECTOMY    . CARDIAC CATHETERIZATION  4/15  . CATARACT EXTRACTION    . CESAREAN SECTION    . COLONOSCOPY  N/A 01/08/2013   Procedure: COLONOSCOPY;  Surgeon: Beryle Beams, MD;  Location: [facility] ENDOSCOPY;  Service: Endoscopy;  Laterality: N/A;  . COLONOSCOPY N/A 12/28/2015   Procedure: COLONOSCOPY;  Surgeon: Carol Ada, MD;  Location: [facility] ENDOSCOPY;  Service: Endoscopy;  Laterality: N/A;  . ENTEROSCOPY N/A 12/28/2015   Procedure: ENTEROSCOPY;  Surgeon: Carol Ada, MD;  Location: [facility];  Service: Endoscopy;  Laterality: N/A;  . ENTEROSCOPY N/A 02/20/2018   Procedure: ENTEROSCOPY;  Surgeon: Carol Ada, MD;  [facility]: [facility];  Service: Endoscopy;  Laterality: N/A;  . ENTEROSCOPY N/A 03/27/2018   Procedure: ENTEROSCOPY;  Surgeon: Carol Ada, MD;  [facility];  Service: Endoscopy;  Laterality: N/A;  . ESOPHAGOGASTRODUODENOSCOPY N/A 01/08/2013   Procedure: ESOPHAGOGASTRODUODENOSCOPY (EGD);  Surgeon: Beryle Beams, MD;  Location: [facility];  Service: Endoscopy;  Laterality: N/A;  . EYE SURGERY    . HAND SURGERY    . HOT HEMOSTASIS N/A 12/28/2015   Procedure: HOT HEMOSTASIS (ARGON PLASMA COAGULATION/BICAP);  Surgeon: Carol Ada, MD;  [facility] ENDOSCOPY;  Service: Endoscopy;  Laterality: N/A;  . HOT HEMOSTASIS N/A 02/20/2018   Procedure: HOT HEMOSTASIS (ARGON PLASMA COAGULATION/BICAP);  Surgeon: Carol Ada, MD;  Location: [facility]OSCOPY;  Service: Endoscopy;  Laterality: N/A;  . HOT HEMOSTASIS N/A 03/27/2018   Procedure: HOT HEMOSTASIS (ARGON PLASMA COAGULATION/BICAP);  Surgeon: Carol Ada, MD;  [facility];  Service: Endoscopy;  Laterality: N/A;  . LEFT HEART CATHETERIZATION WITH CORONARY ANGIOGRAM N/A 03/09/2013   Procedure: LEFT HEART CATHETERIZATION WITH CORONARY ANGIOGRAM;  Surgeon: Laverda Page, MD;  Location: [facility] CATH LAB;  Service: Cardiovascular;  Laterality: N/A;  . LOWER EXTREMITY ANGIOGRAM N/A 12/29/2012   Procedure: LOWER EXTREMITY ANGIOGRAM;  Surgeon: Laverda Page, MD;  Location: [facility] CATH LAB;  Service: Cardiovascular;  Laterality: N/A;  .  LOWER EXTREMITY ANGIOGRAM N/A 10/05/2013   Procedure: LOWER EXTREMITY ANGIOGRAM;  Surgeon: Laverda Page, MD;  Location: [facility] CATH LAB;  Service: Cardiovascular;  Laterality: N/A;  . LOWER EXTREMITY ANGIOGRAPHY N/A 02/11/2017   Procedure: Lower Extremity Angiography;  Surgeon: Adrian Prows, MD;  Location: [facility] CV LAB;  Service: Cardiovascular;  Laterality: N/A;  . PERIPHERAL VASCULAR BALLOON ANGIOPLASTY  02/11/2017   Procedure: PERIPHERAL VASCULAR BALLOON ANGIOPLASTY;  Surgeon: Adrian Prows, MD;  Location: [facility] CV LAB;  Service: Cardiovascular;;  Right SFA  . PERIPHERAL VASCULAR INTERVENTION Right 02/11/2017   Procedure: PERIPHERAL VASCULAR INTERVENTION;  Surgeon: Adrian Prows, MD;  Location: [facility] CV LAB;  Service: Cardiovascular;  Laterality: Right;  Rt SFA  . stent in right leg  12/29/2012   for blood clot, Dr. Nadyne Coombes  . TYMPANOMASTOIDECTOMY Left 03/08/2014   Procedure: TYMPANOMASTOIDECTOMY LEFT;  Surgeon: Ascencion Dike, MD;  Location: [facility];  Service: ENT;  Laterality: Left;  . TYMPANOMASTOIDECTOMY  2015  . UTERINE FIBROID SURGERY       OB History   No obstetric history on file.     Family History  Problem Relation Age of Onset  . Diabetes Mother   . Hypertension Mother   . Heart disease Mother   . Heart disease Father   . Hypertension Father     Social History   Tobacco Use  . Smoking status: Current Every Day Smoker    Packs/day: 0.50    Years: 41.00    Pack years: 20.50    Types: Cigarettes  . Smokeless tobacco: Never Used  Substance Use Topics  . Alcohol use: Yes    Comment: rarely   . Drug use: Yes    Types: Marijuana    Comment: last-02/24/14    Home Medications Prior to Admission medications   Medication Sig Start Date End Date Taking? Authorizing Provider  albuterol (PROVENTIL HFA;VENTOLIN HFA) 108 (90 Base) MCG/ACT inhaler Inhale 1-2 puffs into the lungs every 6 (six) hours as needed for wheezing or shortness of breath.     [provider]  aspirin EC 81 MG tablet Take 81 mg by mouth daily.     [provider]  gabapentin (NEURONTIN) 600 MG tablet Take 1 tablet (600 mg total) by mouth at bedtime. Patient taking differently: Take 600 mg by mouth at bedtime as needed (pain).  10/02/17   Hyatt, Max T, DPM  hydrALAZINE (APRESOLINE) 50 MG tablet Take 1 tablet (50 mg total) by mouth 3 (three) times daily. 11/02/18   Miquel Dunn, NP  loperamide (IMODIUM) 2 MG capsule Take 1 capsule (2 mg total) by mouth 4 (four) times daily as needed for diarrhea or loose stools. 12/21/18   Charlesetta Shanks, MD  losartan-hydrochlorothiazide (HYZAAR) 100-25 MG tablet Take 1 tablet by mouth daily.    [provider]  meloxicam (MOBIC) 7.5 MG tablet Take 2 tablets (15 mg total) by mouth daily. 06/05/19   Margarita Mail, PA-C  metFORMIN (GLUCOPHAGE) 500 MG tablet Take 0.5 tablets (250 mg total)  by mouth 2 (two) times daily with a meal. 02/13/17   Adrian Prows, MD  nitroGLYCERIN (NITROSTAT) 0.4 MG SL tablet Place 1 tablet (0.4 mg total) under the tongue every 5 (five) minutes as needed for chest pain. 01/13/19 04/13/19  Miquel Dunn, NP  pantoprazole (PROTONIX) 40 MG tablet 40 mg daily. 12/04/18   [provider]  pravastatin (PRAVACHOL) 20 MG tablet Take 20 mg by mouth daily.    [provider]  spironolactone (ALDACTONE) 25 MG tablet Take 1 tablet (25 mg total) by mouth daily. 01/20/19 04/20/19  Miquel Dunn, NP    Allergies    Patient has no known allergies.  Review of Systems   Review of Systems Ten systems reviewed and are negative for acute change, except as noted in the HPI.   Physical Exam Updated Vital Signs BP (!) 166/78 (BP Location: Right Arm)   Pulse 60   Temp 98.2 F (36.8 C) (Oral)   Resp 17   Ht 5' (1.524 m)   Wt 93.9 kg   SpO2 100%   BMI 40.43 kg/m   Physical Exam Vitals and nursing note reviewed.  Constitutional:      General: She is not in acute  distress.    Appearance: She is well-developed. She is not diaphoretic.  HENT:     Head: Normocephalic and atraumatic.  Eyes:     General: No scleral icterus.    Conjunctiva/sclera: Conjunctivae normal.  Cardiovascular:     Rate and Rhythm: Normal rate and regular rhythm.     Heart sounds: Normal heart sounds. No murmur. No friction rub. No gallop.   Pulmonary:     Effort: Pulmonary effort is normal. No respiratory distress.     Breath sounds: Normal breath sounds.  Abdominal:     General: Bowel sounds are normal. There is no distension.     Palpations: Abdomen is soft. There is no mass.     Tenderness: There is no abdominal tenderness. There is no guarding.  Musculoskeletal:     Cervical back: Normal range of motion.     Left knee: No swelling, deformity, effusion, erythema, ecchymosis, lacerations or bony tenderness. Normal range of motion. Tenderness present over the MCL. No LCL laxity, MCL laxity, ACL laxity or PCL laxity.Normal alignment, normal meniscus and normal patellar mobility. Normal pulse.  Skin:    General: Skin is warm and dry.  Neurological:     Mental Status: She is alert and oriented to person, place, and time.  Psychiatric:        Behavior: Behavior normal.     ED Results / Procedures / Treatments   Labs (all labs ordered are listed, but only abnormal results are displayed) Labs Reviewed - No data to display  EKG None  Radiology DG Knee Complete 4 Views Left  Result Date: 06/05/2019 CLINICAL DATA:  Pt states he bent over this a.m. and felt a "popping sensation" to her left leg. Denies any known trauma. H/o arthritis. EXAM: LEFT KNEE - COMPLETE 4+ VIEW COMPARISON:  Left knee radiographs 12/17/2008 FINDINGS: No evidence of fracture, dislocation, or joint effusion. Significant tricompartmental spurring as well as in the is a pathic change at the medial epicondyle. Joint spaces are relatively well maintained. Possible small loose body in the patellofemoral  compartment. Vascular calcifications are noted in the regional soft tissues. IMPRESSION: No acute finding in the left knee. Moderate tricompartmental degenerative changes progressed compared to the prior study. Possible small loose body in the patellofemoral compartment.  Electronically Signed   By: Audie Pinto M.D.   On: 06/05/2019 16:04    Procedures Procedures (including critical care time)  Medications Ordered in ED Medications - No data to display  ED Course  I have reviewed the triage vital signs and the nursing notes.  Pertinent labs & imaging results that were available during my care of the patient were reviewed by me and considered in my medical decision making (see chart for details).    MDM Rules/Calculators/A&P     CHA2DS2/VAS Stroke Risk Points      N/A >= 2 Points: High Risk  1 - 1.99 Points: Medium Risk  0 Points: Low Risk    A final score could not be computed because of missing components.: Last  Change: N/A     This score determines the patient's risk of having a stroke if the  patient has atrial fibrillation.      This score is not applicable to this patient. Components are not  calculated.                   This is a 69 year old female hearing felt a pop in her medial left knee.  She is tender to palpation along the medial collateral ligament.  No patellar tenderness or joint line tenderness.  No effusion.  She felt a pop but has no mechanical symptoms at this time.  She has normal strength and range of motion.  Question snapping tendon versus MCL sprain.  Patient be placed in a knee sleeve or Ace wrap, can use crutches to reduce bearing weight on the leg.  Anti-inflammatories for pain and outpatient follow-up with orthopedics.  Discussed return precautions. Final Clinical Impression(s) / ED Diagnoses Final diagnoses:  Acute pain of left knee    Rx / DC Orders ED Discharge Orders         Ordered    meloxicam (MOBIC) 7.5 MG tablet  Daily     06/05/19  1701           Margarita Mail, PA-C 06/05/19 1702    Julianne Rice, MD 06/05/19 1744

## 2019-06-05 NOTE — Discharge Instructions (Addendum)
Get help right away if: Your knee swells, and the swelling becomes worse. You cannot move your knee. You have severe pain in your knee.

## 2019-06-05 NOTE — ED Triage Notes (Signed)
Pt states she bent over to reach into car to get her water, left knee popped and has been hurting since event

## 2019-06-08 ENCOUNTER — Encounter: Payer: Self-pay | Admitting: Family Medicine

## 2019-06-08 ENCOUNTER — Ambulatory Visit (INDEPENDENT_AMBULATORY_CARE_PROVIDER_SITE_OTHER): Payer: Medicare HMO | Admitting: Family Medicine

## 2019-06-08 ENCOUNTER — Other Ambulatory Visit: Payer: Self-pay

## 2019-06-08 DIAGNOSIS — M25562 Pain in left knee: Secondary | ICD-10-CM | POA: Diagnosis not present

## 2019-06-08 MED ORDER — CELECOXIB 200 MG PO CAPS: 200.0000 mg | ORAL_CAPSULE | Freq: Two times a day (BID) | ORAL | 6 refills | Status: DC | PRN

## 2019-06-08 NOTE — Progress Notes (Signed)
Office Visit Note   Patient: [name]           Date of Birth: 09-23-1950           MRN: [id number] Visit Date: 06/08/2019 Requested by: Benito Mccreedy, MD [id number] [place],  [place] [address] PCP: Benito Mccreedy, MD  Subjective: Chief Complaint  Patient presents with  . Left Knee - Pain    06/05/19, she was bending down in the car to pick up some bottles of water, and heard a pop in the knee. Hurts to bend the knee. Had xrays at ED. H/o dislocation of the patella over 30 years ago. Been icing and elevating the knee.    HPI: She is here with left knee pain.  3 days ago she was standing at her car door, bending forward to pick up some bottles of water and she felt and heard a pop on the medial side of her left knee.  Pain was severe, she went to the ER where x-rays showed tricompartmental arthritis but no acute abnormality.  She has been using ice and meloxicam and her pain has gone from 8/10 down to 4/10.  Pain remains on the medial aspect, no locking or giving way.  She states that about 30 years ago she dislocated her kneecap and wore a brace for a while but she became pain-free and has not had any trouble other than occasional aches and pains from arthritis.              ROS:   All other systems were reviewed and are negative.  Objective: Vital Signs: There were no vitals taken for this visit.  Physical Exam:  General:  Alert and oriented, in no acute distress. Pulm:  Breathing unlabored. Psy:  Normal mood, congruent affect.  Left knee: 1+ effusion with no warmth.  2+ patellofemoral crepitus.  Slight pain with patella compression but maximum tenderness is on the medial joint line.  She has pain but no palpable click with McMurray's.  No laxity with varus or valgus stress, anterior drawer feels stable.  Imaging: None today but hospital x-rays were reviewed showing tricompartmental moderate DJD.  Assessment & Plan: 1.  Left knee pain,  concerning for degenerative medial meniscus tear -Since she is improving she would like to try conservative measures with Celebrex as needed, ice applications.  If symptoms worsen, she will come back in for cortisone injection.     Procedures: No procedures performed  No notes on file     PMFS History: Patient Active Problem List   Diagnosis Date Noted  . Tobacco use disorder 09/18/2018  . Iron deficiency anemia 04/09/2018  . Symptomatic anemia 12/26/2015  . Hypokalemia 12/26/2015  . Type 2 diabetes mellitus with peripheral vascular disease ([place]) 12/26/2015  . Claudication in peripheral vascular disease ([place]) 10/05/2013  . Discomfort in chest 05/11/2012  . Nicotine dependence 05/11/2012  . Bradycardia 05/11/2012  . HTN (hypertension) 05/11/2012  . Back pain 05/11/2012   Past Medical History:  Diagnosis Date  . Anemia 12/26/2015  . Anxiety   . Arthritis   . Blood transfusion without reported diagnosis   . Chronic pain   . COPD (chronic obstructive pulmonary disease) ([place])   . Deep vein blood clot of right lower extremity ([place]) 12/29/2012   Dr. Nadyne Coombes, stent in place  . Depression   . Diabetes mellitus without complication ([place])   . GERD (gastroesophageal reflux disease)   . Heart murmur   .  Hyperlipemia   . Hypertension   . Sleep apnea    uses a cpap  . Wears glasses     Family History  Problem Relation Age of Onset  . Diabetes Mother   . Hypertension Mother   . Heart disease Mother   . Heart disease Father   . Hypertension Father     Past Surgical History:  Procedure Laterality Date  . ABDOMINAL HYSTERECTOMY    . CARDIAC CATHETERIZATION  4/15  . CATARACT EXTRACTION    . CESAREAN SECTION    . COLONOSCOPY N/A 01/08/2013   Procedure: COLONOSCOPY;  Surgeon: Beryle Beams, MD;  [facility];  Service: Endoscopy;  Laterality: N/A;  . COLONOSCOPY N/A 12/28/2015   Procedure: COLONOSCOPY;  Surgeon: Carol Ada, MD;  Location: [facility] ENDOSCOPY;  Service:  Endoscopy;  Laterality: N/A;  . ENTEROSCOPY N/A 12/28/2015   Procedure: ENTEROSCOPY;  Surgeon: Carol Ada, MD;  Location: [facility];  Service: Endoscopy;  Laterality: N/A;  . ENTEROSCOPY N/A 02/20/2018   Procedure: ENTEROSCOPY;  Surgeon: Carol Ada, MD;  [facility]OS[facility];  Service: Endoscopy;  Laterality: N/A;  . ENTEROSCOPY N/A 03/27/2018   Procedure: ENTEROSCOPY;  Surgeon: Carol Ada, MD;  [facility]OS[facility];  Service: Endoscopy;  Laterality: N/A;  . ESOPHAGOGASTRODUODENOSCOPY N/A 01/08/2013   Procedure: ESOPHAGOGASTRODUODENOSCOPY (EGD);  Surgeon: Beryle Beams, MD;  Location: [facility] ENDOSCOPY;  Service: Endoscopy;  Laterality: N/A;  . EYE SURGERY    . HAND SURGERY    . HOT HEMOSTASIS N/A 12/28/2015   Procedure: HOT HEMOSTASIS (ARGON PLASMA COAGULATION/BICAP);  Surgeon: Carol Ada, MD;  Location: [facility] ENDOSCOPY;  Service: Endoscopy;  Laterality: N/A;  . HOT HEMOSTASIS N/A 02/20/2018   Procedure: HOT HEMOSTASIS (ARGON PLASMA COAGULATION/BICAP);  Surgeon: Carol Ada, MD;  Location: [facility] ENDOSCOPY;  Service: Endoscopy;  Laterality: N/A;  . HOT HEMOSTASIS N/A 03/27/2018   Procedure: HOT HEMOSTASIS (ARGON PLASMA COAGULATION/BICAP);  Surgeon: Carol Ada, MD;  Location: [facility] ENDOSCOPY;  Service: Endoscopy;  Laterality: N/A;  . LEFT HEART CATHETERIZATION WITH CORONARY ANGIOGRAM N/A 03/09/2013   Procedure: LEFT HEART CATHETERIZATION WITH CORONARY ANGIOGRAM;  Surgeon: Laverda Page, MD;  Location: [facility] CATH LAB;  Service: Cardiovascular;  Laterality: N/A;  . LOWER EXTREMITY ANGIOGRAM N/A 12/29/2012   Procedure: LOWER EXTREMITY ANGIOGRAM;  Surgeon: Laverda Page, MD;  Location: [facility] CATH LAB;  Service: Cardiovascular;  Laterality: N/A;  . LOWER EXTREMITY ANGIOGRAM N/A 10/05/2013   Procedure: LOWER EXTREMITY ANGIOGRAM;  Surgeon: Laverda Page, MD;  Location: [facility] CATH LAB;  Service: Cardiovascular;  Laterality: N/A;  . LOWER EXTREMITY ANGIOGRAPHY N/A 02/11/2017   Procedure: Lower Extremity  Angiography;  Surgeon: Adrian Prows, MD;  Location: [facility] CV LAB;  Service: Cardiovascular;  Laterality: N/A;  . PERIPHERAL VASCULAR BALLOON ANGIOPLASTY  02/11/2017   Procedure: PERIPHERAL VASCULAR BALLOON ANGIOPLASTY;  Surgeon: Adrian Prows, MD;  Location: [facility] CV LAB;  Service: Cardiovascular;;  Right SFA  . PERIPHERAL VASCULAR INTERVENTION Right 02/11/2017   Procedure: PERIPHERAL VASCULAR INTERVENTION;  Surgeon: Adrian Prows, MD;  Location: [place] [facility] CV LAB;  Service: Cardiovascular;  Laterality: Right;  Rt SFA  . stent in right leg  12/29/2012   for blood clot, Dr. Nadyne Coombes  . TYMPANOMASTOIDECTOMY Left 03/08/2014   Procedure: TYMPANOMASTOIDECTOMY LEFT;  Surgeon: Ascencion Dike, MD;  Location: [facility];  Service: ENT;  Laterality: Left;  . TYMPANOMASTOIDECTOMY  2015  . UTERINE FIBROID SURGERY     Social History   Occupational History  . Not on file  Tobacco Use  . Smoking status: Current Every Day Smoker    Packs/day: 0.50    Years: 41.00    Pack years: 20.50    Types: Cigarettes  . Smokeless tobacco: Never Used  Substance and Sexual Activity  . Alcohol use: Yes    Comment: rarely   . Drug use: Yes    Types: Marijuana    Comment: last-02/24/14  . Sexual activity: Not on file

## 2019-06-09 DIAGNOSIS — H6122 Impacted cerumen, left ear: Secondary | ICD-10-CM | POA: Diagnosis not present

## 2019-06-09 DIAGNOSIS — H903 Sensorineural hearing loss, bilateral: Secondary | ICD-10-CM | POA: Diagnosis not present

## 2019-06-14 ENCOUNTER — Telehealth: Payer: Self-pay | Admitting: Family Medicine

## 2019-06-14 NOTE — Telephone Encounter (Signed)
Pt called in said that she was prescribed Celebrex by dr.hilts but she states she cant take that medication due to her heart problems, high blood pressure, etc. She is requesting a call back from someone regarding this medication.    [phone]

## 2019-06-14 NOTE — Telephone Encounter (Signed)
I called the patient. She has just continued taking the meloxicam instead. She had decided she would like to try the cortisone injection. Appointment scheduled for tomorrow morning at 10:0 with Dr. Junius Roads.

## 2019-06-15 ENCOUNTER — Ambulatory Visit (INDEPENDENT_AMBULATORY_CARE_PROVIDER_SITE_OTHER): Payer: Medicare HMO | Admitting: Family Medicine

## 2019-06-15 ENCOUNTER — Other Ambulatory Visit: Payer: Self-pay

## 2019-06-15 ENCOUNTER — Encounter: Payer: Self-pay | Admitting: Family Medicine

## 2019-06-15 DIAGNOSIS — M25562 Pain in left knee: Secondary | ICD-10-CM

## 2019-06-15 NOTE — Progress Notes (Signed)
[name] - 68 y.o. female MRN [id number]  Date of birth: [birth year]  Office Visit Note: Visit Date: 06/15/2019 PCP: Benito Mccreedy, MD Referred by: Benito Mccreedy, MD  Subjective: Chief Complaint  Patient presents with  . Left Knee - Pain    Requests cortisone injection. Did not start the celebrex - afraid to take this due to being on heart medications.   HPI: [name] is a 68 y.o. female who comes in today with chronic left knee pain. She was seen on 12/15 after a fall on 12/12. ED x-rays showed tricompartmental arthritis. She was prescribed celebrex but could not take it due to cardiac issues. She reports that she has been trying ice and elevation with minimal relief. Requests injection.   ROS Otherwise per HPI.  Assessment & Plan: Visit Diagnoses:  1. Acute pain of left knee     Plan: Corticosteroid injection of left knee with anesthetic relief after injection. Return as needed.  Meds & Orders: No orders of the defined types were placed in this encounter.  No orders of the defined types were placed in this encounter.   Follow-up: PRN  Procedures: Procedure performed: knee intraarticular corticosteroid injection; landmark guided  Time-out conducted. Noted no overlying erythema, induration, or other signs of local infection. The left superior lateral joint space was palpated and marked. The overlying skin was prepped in a sterile fashion. Topical analgesic spray: Ethyl chloride. Joint: left knee Needle: 25 gauge, 1.5 inch Completed without difficulty. Meds: 40 mg methylrprednisolone, 4 ml 1% lidocaine without epinephrine   Consent obtained and verified. Time-out conducted.    Clinical History: No specialty comments available.   She reports that she has been smoking cigarettes. She has a 20.50 pack-year smoking history. She has never used smokeless tobacco. No results for input(s): HGBA1C, LABURIC in the last 8760  hours.  Objective:  VS:  HT:    WT:   BMI:     BP:   HR: bpm  TEMP: ( )  RESP:  Physical Exam  PHYSICAL EXAM: Gen: NAD, alert, cooperative with exam, well-appearing HEENT: clear conjunctiva,  CV:  no edema, capillary refill brisk, normal rate Resp: non-labored Skin: no rashes, normal turgor  Neuro: no gross deficits.  Psych:  alert and oriented  Ortho Exam  Left Knee: - Inspection: 1+ effusion - Palpation: TTP over medial joint line and with patellar compression - ROM: full active ROM with flexion and extension in knee and hip - Strength: 5/5 strength - Neuro/vasc: NV intact  Imaging: None today  Past Medical/Family/Surgical/Social History: Medications & Allergies reviewed per EMR, new medications updated. Patient Active Problem List   Diagnosis Date Noted  . Tobacco use disorder 09/18/2018  . Iron deficiency anemia 04/09/2018  . Symptomatic anemia 12/26/2015  . Hypokalemia 12/26/2015  . Type 2 diabetes mellitus with peripheral vascular disease ([place]) 12/26/2015  . Claudication in peripheral vascular disease ([place]) 10/05/2013  . Discomfort in chest 05/11/2012  . Nicotine dependence 05/11/2012  . Bradycardia 05/11/2012  . HTN (hypertension) 05/11/2012  . Back pain 05/11/2012   Past Medical History:  Diagnosis Date  . Anemia 12/26/2015  . Anxiety   . Arthritis   . Blood transfusion without reported diagnosis   . Chronic pain   . COPD (chronic obstructive pulmonary disease) ([place])   . Deep vein blood clot of right lower extremity ([place]) 12/29/2012   Dr. Nadyne Coombes, stent in place  . Depression   . Diabetes mellitus without complication ([place])   .  GERD (gastroesophageal reflux disease)   . Heart murmur   . Hyperlipemia   . Hypertension   . Sleep apnea    uses a cpap  . Wears glasses    Family History  Problem Relation Age of Onset  . Diabetes Mother   . Hypertension Mother   . Heart disease Mother   . Heart disease Father   . Hypertension Father    Past  Surgical History:  Procedure Laterality Date  . ABDOMINAL HYSTERECTOMY    . CARDIAC CATHETERIZATION  4/15  . CATARACT EXTRACTION    . CESAREAN SECTION    . COLONOSCOPY N/A 01/08/2013   Procedure: COLONOSCOPY;  Surgeon: Beryle Beams, MD;  Location: [facility] ENDOSCOPY;  Service: Endoscopy;  Laterality: N/A;  . COLONOSCOPY N/A 12/28/2015   Procedure: COLONOSCOPY;  Surgeon: Carol Ada, MD;  Location: [facility] ENDOSCOPY;  Service: Endoscopy;  Laterality: N/A;  . ENTEROSCOPY N/A 12/28/2015   Procedure: ENTEROSCOPY;  Surgeon: Carol Ada, MD;  Location: [place];  Service: Endoscopy;  Laterality: N/A;  . ENTEROSCOPY N/A 02/20/2018   Procedure: ENTEROSCOPY;  Surgeon: Carol Ada, MD;  [facility]: [facility] ENDOSCOPY;  Service: Endoscopy;  Laterality: N/A;  . ENTEROSCOPY N/A 03/27/2018   Procedure: ENTEROSCOPY;  Surgeon: Carol Ada, MD;  [facility] ENDOS[facility];  Service: Endoscopy;  Laterality: N/A;  . ESOPHAGOGASTRODUODENOSCOPY N/A 01/08/2013   Procedure: ESOPHAGOGASTRODUODENOSCOPY (EGD);  Surgeon: Beryle Beams, MD;  Location: [facility]OS[facility];  Service: Endoscopy;  Laterality: N/A;  . EYE SURGERY    . HAND SURGERY    . HOT HEMOSTASIS N/A 12/28/2015   Procedure: HOT HEMOSTASIS (ARGON PLASMA COAGULATION/BICAP);  Surgeon: Carol Ada, MD;  Location: [facility] ENDOSCOPY;  Service: Endoscopy;  Laterality: N/A;  . HOT HEMOSTASIS N/A 02/20/2018   Procedure: HOT HEMOSTASIS (ARGON PLASMA COAGULATION/BICAP);  Surgeon: Carol Ada, MD;  Location: [facility] ENDOSCOPY;  Service: Endoscopy;  Laterality: N/A;  . HOT HEMOSTASIS N/A 03/27/2018   Procedure: HOT HEMOSTASIS (ARGON PLASMA COAGULATION/BICAP);  Surgeon: Carol Ada, MD;  Location: [facility] ENDOSCOPY;  Service: Endoscopy;  Laterality: N/A;  . LEFT HEART CATHETERIZATION WITH CORONARY ANGIOGRAM N/A 03/09/2013   Procedure: LEFT HEART CATHETERIZATION WITH CORONARY ANGIOGRAM;  Surgeon: Laverda Page, MD;  Location: [facility] CATH LAB;  Service: Cardiovascular;  Laterality: N/A;  . LOWER EXTREMITY  ANGIOGRAM N/A 12/29/2012   Procedure: LOWER EXTREMITY ANGIOGRAM;  Surgeon: Laverda Page, MD;  Location[facility] LAB;  Service: Cardiovascular;  Laterality: N/A;  . LOWER EXTREMITY ANGIOGRAM N/A 10/05/2013   Procedure: LOWER EXTREMITY ANGIOGRAM;  Surgeon: Laverda Page, MD;  Location: [facility] CATH LAB;  Service: Cardiovascular;  Laterality: N/A;  . LOWER EXTREMITY ANGIOGRAPHY N/A 02/11/2017   Procedure: Lower Extremity Angiography;  Surgeon: Adrian Prows, MD;  Location: [place] CV LAB;  Service: Cardiovascular;  Laterality: N/A;  . PERIPHERAL VASCULAR BALLOON ANGIOPLASTY  02/11/2017   Procedure: PERIPHERAL VASCULAR BALLOON ANGIOPLASTY;  Surgeon: Adrian Prows, MD;  Location: [place] CV LAB;  Service: Cardiovascular;;  Right SFA  . PERIPHERAL VASCULAR INTERVENTION Right 02/11/2017   Procedure: PERIPHERAL VASCULAR INTERVENTION;  Surgeon: Adrian Prows, MD;  Location: [place] CV LAB;  Service: Cardiovascular;  Laterality: Right;  Rt SFA  . stent in right leg  12/29/2012   for blood clot, Dr. Nadyne Coombes  . TYMPANOMASTOIDECTOMY Left 03/08/2014   Procedure: TYMPANOMASTOIDECTOMY LEFT;  Surgeon: Ascencion Dike, MD;  Location: [place];  Service: ENT;  Laterality: Left;  . TYMPANOMASTOIDECTOMY  2015  . [place]  History   Occupational History  . Not on file  Tobacco Use  . Smoking status: Current Every Day Smoker    Packs/day: 0.50    Years: 41.00    Pack years: 20.50    Types: Cigarettes  . Smokeless tobacco: Never Used  Substance and Sexual Activity  . Alcohol use: Yes    Comment: rarely   . Drug use: Yes    Types: Marijuana    Comment: last-02/24/14  . Sexual activity: Not on file

## 2019-06-15 NOTE — Progress Notes (Signed)
I saw and examined the patient with Dr. Mayer Masker and agree with assessment and plan as outlined.  Left knee pain not responding to Celebrex.  We will inject with cortisone today.  Follow-up as needed.

## 2019-07-04 ENCOUNTER — Emergency Department (HOSPITAL_COMMUNITY): Payer: Medicare HMO

## 2019-07-04 ENCOUNTER — Encounter (HOSPITAL_COMMUNITY): Payer: Self-pay

## 2019-07-04 ENCOUNTER — Emergency Department (HOSPITAL_COMMUNITY)
Admission: EM | Admit: 2019-07-04 | Discharge: 2019-07-04 | Disposition: A | Discharge status: Home / Self Care | Payer: Medicare HMO | Attending: Emergency Medicine | Admitting: Emergency Medicine

## 2019-07-04 DIAGNOSIS — F17210 Nicotine dependence, cigarettes, uncomplicated: Secondary | ICD-10-CM | POA: Diagnosis not present

## 2019-07-04 DIAGNOSIS — Z7189 Other specified counseling: Secondary | ICD-10-CM

## 2019-07-04 DIAGNOSIS — E119 Type 2 diabetes mellitus without complications: Secondary | ICD-10-CM | POA: Diagnosis not present

## 2019-07-04 DIAGNOSIS — J449 Chronic obstructive pulmonary disease, unspecified: Secondary | ICD-10-CM | POA: Insufficient documentation

## 2019-07-04 DIAGNOSIS — D649 Anemia, unspecified: Secondary | ICD-10-CM | POA: Diagnosis not present

## 2019-07-04 DIAGNOSIS — R519 Headache, unspecified: Secondary | ICD-10-CM | POA: Insufficient documentation

## 2019-07-04 DIAGNOSIS — Z79899 Other long term (current) drug therapy: Secondary | ICD-10-CM | POA: Diagnosis not present

## 2019-07-04 DIAGNOSIS — E669 Obesity, unspecified: Secondary | ICD-10-CM | POA: Insufficient documentation

## 2019-07-04 DIAGNOSIS — R600 Localized edema: Secondary | ICD-10-CM | POA: Diagnosis not present

## 2019-07-04 DIAGNOSIS — Z7982 Long term (current) use of aspirin: Secondary | ICD-10-CM | POA: Insufficient documentation

## 2019-07-04 DIAGNOSIS — I10 Essential (primary) hypertension: Secondary | ICD-10-CM | POA: Diagnosis not present

## 2019-07-04 DIAGNOSIS — Z20822 Contact with and (suspected) exposure to covid-19: Secondary | ICD-10-CM | POA: Insufficient documentation

## 2019-07-04 DIAGNOSIS — J984 Other disorders of lung: Secondary | ICD-10-CM | POA: Diagnosis not present

## 2019-07-04 DIAGNOSIS — Z7984 Long term (current) use of oral hypoglycemic drugs: Secondary | ICD-10-CM | POA: Insufficient documentation

## 2019-07-04 DIAGNOSIS — G4489 Other headache syndrome: Secondary | ICD-10-CM | POA: Diagnosis not present

## 2019-07-04 LAB — BASIC METABOLIC PANEL
Anion gap: 8 (ref 5–15)
BUN: 17 mg/dL (ref 8–23)
CO2: 27 mmol/L (ref 22–32)
Calcium: 8.9 mg/dL (ref 8.9–10.3)
Chloride: 107 mmol/L (ref 98–111)
Creatinine, Ser: 0.85 mg/dL (ref 0.44–1.00)
GFR calc Af Amer: >60 mL/min (ref >60)
GFR calc non Af Amer: >60 mL/min (ref >60)
Glucose, Bld: 91 mg/dL (ref 70–99)
Potassium: 4 mmol/L (ref 3.5–5.1)
Sodium: 142 mmol/L (ref 135–145)

## 2019-07-04 LAB — BRAIN NATRIURETIC PEPTIDE: B Natriuretic Peptide: 81.7 pg/mL (ref 0.0–100.0)

## 2019-07-04 LAB — CBC WITH DIFFERENTIAL/PLATELET
Abs Immature Granulocytes: 0.04 10*3/uL (ref 0.00–0.07)
Basophils Absolute: 0.1 10*3/uL (ref 0.0–0.1)
Basophils Relative: 1 %
Eosinophils Absolute: 0.3 10*3/uL (ref 0.0–0.5)
Eosinophils Relative: 3 %
HCT: 34.3 % — ABNORMAL LOW (ref 36.0–46.0)
Hemoglobin: 9.5 g/dL — ABNORMAL LOW (ref 12.0–15.0)
Immature Granulocytes: 1 %
Lymphocytes Relative: 24 %
Lymphs Abs: 1.8 10*3/uL (ref 0.7–4.0)
MCH: 21.6 pg — ABNORMAL LOW (ref 26.0–34.0)
MCHC: 27.7 g/dL — ABNORMAL LOW (ref 30.0–36.0)
MCV: 78.1 fL — ABNORMAL LOW (ref 80.0–100.0)
Monocytes Absolute: 0.7 10*3/uL (ref 0.1–1.0)
Monocytes Relative: 9 %
Neutro Abs: 4.9 10*3/uL (ref 1.7–7.7)
Neutrophils Relative %: 62 %
Platelets: 542 10*3/uL — ABNORMAL HIGH (ref 150–400)
RBC: 4.39 MIL/uL (ref 3.87–5.11)
RDW: 19.9 % — ABNORMAL HIGH (ref 11.5–15.5)
WBC: 7.8 10*3/uL (ref 4.0–10.5)
nRBC: 0 % (ref 0.0–0.2)

## 2019-07-04 LAB — POC OCCULT BLOOD, ED: Fecal Occult Bld: NEGATIVE

## 2019-07-04 MED ORDER — ACETAMINOPHEN 325 MG PO TABS: 650.0000 mg | ORAL_TABLET | Freq: Once | ORAL | Status: DC

## 2019-07-04 NOTE — ED Provider Notes (Signed)
[name] Provider Note   CSN: [id number] Arrival date & time: 07/04/19  1106     History Chief Complaint  Patient presents with  . Hypertension    [name] is a 69 y.o. female with a past medical history of COPD, HTN, HLD, anxiety, anemia, chronic pain, diabetes, who presents to ED with a chief complaint of hypertension. States she has had intermittent leg swelling for the past 3 to 4 days.  Woke up this morning with headache.  She checked her blood pressure this morning and was found to be in the 371I systolic.  She reports compliance with her home hydrochlorothiazide.  She is not taking any medications for her headache.  Reports intermittent blurry vision for the past 3 days as well.  Denies any injuries or falls, numbness in arms or legs, chest pain, shortness of breath, hemoptysis, abdominal pain, vomiting.  HPI     Past Medical History:  Diagnosis Date  . Anemia 12/26/2015  . Anxiety   . Arthritis   . Blood transfusion without reported diagnosis   . Chronic pain   . COPD (chronic obstructive pulmonary disease) ([place])   . Deep vein blood clot of right lower extremity ([place]) 12/29/2012   Dr. Nadyne Coombes, stent in place  . Depression   . Diabetes mellitus without complication ([place])   . GERD (gastroesophageal reflux disease)   . Heart murmur   . Hyperlipemia   . Hypertension   . Sleep apnea    uses a cpap  . Wears glasses     Patient Active Problem List   Diagnosis Date Noted  . Tobacco use disorder 09/18/2018  . Iron deficiency anemia 04/09/2018  . Symptomatic anemia 12/26/2015  . Hypokalemia 12/26/2015  . Type 2 diabetes mellitus with peripheral vascular disease ([place]) 12/26/2015  . Claudication in peripheral vascular disease ([place]) 10/05/2013  . Discomfort in chest 05/11/2012  . Nicotine dependence 05/11/2012  . Bradycardia 05/11/2012  . HTN (hypertension) 05/11/2012  . Back pain 05/11/2012    Past Surgical History:   Procedure Laterality Date  . ABDOMINAL HYSTERECTOMY    . CARDIAC CATHETERIZATION  4/15  . CATARACT EXTRACTION    . CESAREAN SECTION    . COLONOSCOPY N/A 01/08/2013   Procedure: COLONOSCOPY;  Surgeon: Beryle Beams, MD;  [facility];  Service: Endoscopy;  Laterality: N/A;  . COLONOSCOPY N/A 12/28/2015   Procedure: COLONOSCOPY;  Surgeon: Carol Ada, MD;  Location: [facility] ENDOSCOPY;  Service: Endoscopy;  Laterality: N/A;  . ENTEROSCOPY N/A 12/28/2015   Procedure: ENTEROSCOPY;  Surgeon: Carol Ada, MD;  Location: [place];  Service: Endoscopy;  Laterality: N/A;  . ENTEROSCOPY N/A 02/20/2018   Procedure: ENTEROSCOPY;  Surgeon: Carol Ada, MD;  [facility] ENDOS[facility];  Service: Endoscopy;  Laterality: N/A;  . ENTEROSCOPY N/A 03/27/2018   Procedure: ENTEROSCOPY;  Surgeon: Carol Ada, MD;  [facility] ENDOS[facility];  Service: Endoscopy;  Laterality: N/A;  . ESOPHAGOGASTRODUODENOSCOPY N/A 01/08/2013   Procedure: ESOPHAGOGASTRODUODENOSCOPY (EGD);  Surgeon: Beryle Beams, MD;  Location: [facility] ENDOSCOPY;  Service: Endoscopy;  Laterality: N/A;  . EYE SURGERY    . HAND SURGERY    . HOT HEMOSTASIS N/A 12/28/2015   Procedure: HOT HEMOSTASIS (ARGON PLASMA COAGULATION/BICAP);  Surgeon: Carol Ada, MD;  Location: [facility] ENDOSCOPY;  Service: Endoscopy;  Laterality: N/A;  . HOT HEMOSTASIS N/A 02/20/2018   Procedure: HOT HEMOSTASIS (ARGON PLASMA COAGULATION/BICAP);  Surgeon: Carol Ada, MD;  Location: [facility] ENDOSCOPY;  Service: Endoscopy;  Laterality: N/A;  .  HOT HEMOSTASIS N/A 03/27/2018   Procedure: HOT HEMOSTASIS (ARGON PLASMA COAGULATION/BICAP);  Surgeon: Carol Ada, MD;  Location: [facility] ENDOSCOPY;  Service: Endoscopy;  Laterality: N/A;  . LEFT HEART CATHETERIZATION WITH CORONARY ANGIOGRAM N/A 03/09/2013   Procedure: LEFT HEART CATHETERIZATION WITH CORONARY ANGIOGRAM;  Surgeon: Laverda Page, MD;  Location: [facility] CATH LAB;  Service: Cardiovascular;  Laterality: N/A;  . LOWER EXTREMITY ANGIOGRAM N/A  12/29/2012   Procedure: LOWER EXTREMITY ANGIOGRAM;  Surgeon: Laverda Page, MD;  Location: [facility] CATH LAB;  Service: Cardiovascular;  Laterality: N/A;  . LOWER EXTREMITY ANGIOGRAM N/A 10/05/2013   Procedure: LOWER EXTREMITY ANGIOGRAM;  Surgeon: Laverda Page, MD;  Location: [facility] CATH LAB;  Service: Cardiovascular;  Laterality: N/A;  . LOWER EXTREMITY ANGIOGRAPHY N/A 02/11/2017   Procedure: Lower Extremity Angiography;  Surgeon: Adrian Prows, MD;  Location: [place] CV LAB;  Service: Cardiovascular;  Laterality: N/A;  . PERIPHERAL VASCULAR BALLOON ANGIOPLASTY  02/11/2017   Procedure: PERIPHERAL VASCULAR BALLOON ANGIOPLASTY;  Surgeon: Adrian Prows, MD;  Location: [place] CV LAB;  Service: Cardiovascular;;  Right SFA  . PERIPHERAL VASCULAR INTERVENTION Right 02/11/2017   Procedure: PERIPHERAL VASCULAR INTERVENTION;  Surgeon: Adrian Prows, MD;  Location: [place] CV LAB;  Service: Cardiovascular;  Laterality: Right;  Rt SFA  . stent in right leg  12/29/2012   for blood clot, Dr. Nadyne Coombes  . TYMPANOMASTOIDECTOMY Left 03/08/2014   Procedure: TYMPANOMASTOIDECTOMY LEFT;  Surgeon: Ascencion Dike, MD;  Location: [place];  Service: ENT;  Laterality: Left;  . TYMPANOMASTOIDECTOMY  2015  . UTERINE FIBROID SURGERY       OB History   No obstetric history on file.     Family History  Problem Relation Age of Onset  . Diabetes Mother   . Hypertension Mother   . Heart disease Mother   . Heart disease Father   . Hypertension Father     Social History   Tobacco Use  . Smoking status: Current Every Day Smoker    Packs/day: 0.50    Years: 41.00    Pack years: 20.50    Types: Cigarettes  . Smokeless tobacco: Never Used  Substance Use Topics  . Alcohol use: Yes    Comment: rarely   . Drug use: Yes    Types: Marijuana    Comment: last-02/24/14    Home Medications Prior to Admission medications   Medication Sig Start Date End Date Taking? Authorizing Provider  Ascorbic Acid (VITAMIN C  PO) Take 1 tablet by mouth daily.   Yes [provider]  losartan-hydrochlorothiazide (HYZAAR) 100-25 MG tablet Take 1 tablet by mouth daily.   Yes [provider]  meloxicam (MOBIC) 7.5 MG tablet Take 2 tablets (15 mg total) by mouth daily. Patient taking differently: Take 7.5 mg by mouth daily.  06/05/19  Yes Margarita Mail, PA-C  metFORMIN (GLUCOPHAGE) 500 MG tablet Take 0.5 tablets (250 mg total) by mouth 2 (two) times daily with a meal. 02/13/17  Yes Adrian Prows, MD  Vitamin D, Ergocalciferol, (DRISDOL) 1.25 MG (50000 UT) CAPS capsule Take 50,000 Units by mouth once a week. 05/26/19  Yes [provider]  albuterol (PROVENTIL HFA;VENTOLIN HFA) 108 (90 Base) MCG/ACT inhaler Inhale 1-2 puffs into the lungs every 6 (six) hours as needed for wheezing or shortness of breath.    [provider]  aspirin EC 81 MG tablet Take 81 mg by mouth daily.     [provider]  celecoxib (CELEBREX) 200 MG capsule Take  1 capsule (200 mg total) by mouth 2 (two) times daily as needed. Patient not taking: Reported on 06/15/2019 06/08/19   Hilts, Legrand Como, MD  ferrous sulfate 325 (65 FE) MG EC tablet Take 1 tablet by mouth daily. 05/27/19   [provider]  gabapentin (NEURONTIN) 600 MG tablet Take 1 tablet (600 mg total) by mouth at bedtime. Patient taking differently: Take 600 mg by mouth at bedtime as needed (pain).  10/02/17   Hyatt, Max T, DPM  hydrALAZINE (APRESOLINE) 50 MG tablet Take 1 tablet (50 mg total) by mouth 3 (three) times daily. 11/02/18   Miquel Dunn, NP  loperamide (IMODIUM) 2 MG capsule Take 1 capsule (2 mg total) by mouth 4 (four) times daily as needed for diarrhea or loose stools. 12/21/18   Charlesetta Shanks, MD  nitroGLYCERIN (NITROSTAT) 0.4 MG SL tablet Place 1 tablet (0.4 mg total) under the tongue every 5 (five) minutes as needed for chest pain. 01/13/19 04/13/19  Miquel Dunn, NP  pantoprazole (PROTONIX) 40 MG tablet Take 40 mg  by mouth daily.  12/04/18   [provider]  pravastatin (PRAVACHOL) 20 MG tablet Take 20 mg by mouth daily.    [provider]  spironolactone (ALDACTONE) 25 MG tablet Take 1 tablet (25 mg total) by mouth daily. 01/20/19 04/20/19  Miquel Dunn, NP    Allergies    Patient has no known allergies.  Review of Systems   Review of Systems  Constitutional: Negative for appetite change, chills and fever.  HENT: Negative for ear pain, rhinorrhea, sneezing and sore throat.   Eyes: Negative for photophobia and visual disturbance.  Respiratory: Negative for cough, chest tightness, shortness of breath and wheezing.   Cardiovascular: Positive for leg swelling. Negative for chest pain and palpitations.  Gastrointestinal: Negative for abdominal pain, blood in stool, constipation, diarrhea, nausea and vomiting.  Genitourinary: Negative for dysuria, hematuria and urgency.  Musculoskeletal: Negative for myalgias.  Skin: Negative for rash.  Neurological: Positive for headaches. Negative for dizziness, weakness and light-headedness.    Physical Exam Updated Vital Signs BP (!) 190/73   Pulse 62   Temp 98.3 F (36.8 C) (Oral)   Resp 18   SpO2 99%   Physical Exam Vitals and nursing note reviewed. Exam conducted with a chaperone present.  Constitutional:      General: She is not in acute distress.    Appearance: She is well-developed. She is obese.  HENT:     Head: Normocephalic and atraumatic.     Nose: Nose normal.  Eyes:     General: No scleral icterus.       Right eye: No discharge.        Left eye: No discharge.     Conjunctiva/sclera: Conjunctivae normal.     Pupils: Pupils are equal, round, and reactive to light.  Cardiovascular:     Rate and Rhythm: Normal rate and regular rhythm.     Heart sounds: Normal heart sounds. No murmur. No friction rub. No gallop.   Pulmonary:     Effort: Pulmonary effort is normal. No respiratory distress.     Breath sounds:  Normal breath sounds.  Abdominal:     General: Bowel sounds are normal. There is no distension.     Palpations: Abdomen is soft.     Tenderness: There is no abdominal tenderness. There is no guarding.  Genitourinary:    Comments: Stool is not grossly melanotic. No external abnormalities noted. Musculoskeletal:  General: Normal range of motion.     Cervical back: Normal range of motion and neck supple.     Right lower leg: Edema present.     Left lower leg: Edema present.  Skin:    General: Skin is warm and dry.     Findings: No rash.  Neurological:     General: No focal deficit present.     Mental Status: She is alert and oriented to person, place, and time.     Cranial Nerves: No cranial nerve deficit.     Sensory: No sensory deficit.     Motor: No weakness or abnormal muscle tone.     Coordination: Coordination normal.     ED Results / Procedures / Treatments   Labs (all labs ordered are listed, but only abnormal results are displayed) Labs Reviewed  CBC WITH DIFFERENTIAL/PLATELET - Abnormal; Notable for the following components:      Result Value   Hemoglobin 9.5 (*)    HCT 34.3 (*)    MCV 78.1 (*)    MCH 21.6 (*)    MCHC 27.7 (*)    RDW 19.9 (*)    Platelets 542 (*)    All other components within normal limits  SARS CORONAVIRUS 2 (TAT 6-24 HRS)  BASIC METABOLIC PANEL  BRAIN NATRIURETIC PEPTIDE  POC OCCULT BLOOD, ED    EKG None  Radiology DG Chest 2 View  Result Date: 07/04/2019 CLINICAL DATA:  Headache with hypertension EXAM: CHEST - 2 VIEW COMPARISON:  March 05, 2018 FINDINGS: There is slight scarring in the left mid and lower lung zones. Lungs elsewhere clear. Heart is slightly enlarged with pulmonary vascularity normal. No adenopathy. There is aortic atherosclerosis. There is degenerative change in the thoracic spine. IMPRESSION: Mild scarring left mid and lower lung zones. Lungs elsewhere clear. Stable cardiac silhouette. No adenopathy. Aortic  Atherosclerosis (ICD10-I70.0). Electronically Signed   By: Lowella Grip III M.D.   On: 07/04/2019 13:44   CT Head Wo Contrast  Result Date: 07/04/2019 CLINICAL DATA:  Headache. EXAM: CT HEAD WITHOUT CONTRAST TECHNIQUE: Contiguous axial images were obtained from the base of the skull through the vertex without intravenous contrast. COMPARISON:  Head CT 06/01/2018 and MRI 02/02/2014 FINDINGS: Brain: There is no evidence of acute infarct, intracranial hemorrhage, mass, midline shift, or extra-axial fluid collection. The ventricles and sulci are normal. An expanded partially empty sella is unchanged from the 2015 MRI. Vascular: No hyperdense vessel. Skull: No fracture or focal osseous lesion. Sinuses/Orbits: Visualized paranasal sinuses and right mastoid air cells are clear. Status post left canal wall up mastoidectomy with small amount of fluid dependently in the mastoidectomy bowl and opacification of residual left mastoid air cells, similar to the prior CT. Bilateral cataract extraction. Other: None. IMPRESSION: No evidence of acute intracranial abnormality. Electronically Signed   By: Logan Bores M.D.   On: 07/04/2019 13:55    Procedures Procedures (including critical care time)  Medications Ordered in ED Medications  acetaminophen (TYLENOL) tablet 650 mg (has no administration in time range)    ED Course  I have reviewed the triage vital signs and the nursing notes.  Pertinent labs & imaging results that were available during my care of the patient were reviewed by me and considered in my medical decision making (see chart for details).    MDM Rules/Calculators/A&P                      Shaday Shunda Rabadi was evaluated in  Emergency Department on 07/04/19  for the symptoms described in the history of present illness. He/she was evaluated in the context of the global COVID-19 pandemic, which necessitated consideration that the patient might be at risk for infection with the SARS-CoV-2  virus that causes COVID-19. Institutional protocols and algorithms that pertain to the evaluation of patients at risk for COVID-19 are in a state of rapid change based on information released by regulatory bodies including the CDC and federal and state organizations. These policies and algorithms were followed during the patient's care in the ED.  69 year old female presents to ED for headache, hypertension and leg swelling.  Woke up this morning with headache, checked her blood pressure was found to be hypertensive to 324 systolic.  She reports compliance with her home hydrochlorothiazide.  Denies any blurry vision, chest pain, shortness of breath.  On exam there are no neurological deficits noted.  She is hypertensive here to 401-027 systolic.  Chest x-ray, CT of the head is unremarkable.  CBC shows hemoglobin of 9.5 which has decreased since normal approximately 6 months ago.  BMP, BNP unremarkable.  Hemoccult is negative.  Covid test is pending.  Suspect that her symptoms could be due to poor control of her hypertension with her current antihypertensives versus viral illness.  Her symptoms are improved here with Tylenol.  We will have her take iron supplements, follow-up with PCP and return for worsening symptoms.  Patient is hemodynamically stable, in NAD, and able to ambulate in the ED. Evaluation does not show pathology that would require ongoing emergent intervention or inpatient treatment. I explained the diagnosis to the patient. Pain has been managed and has no complaints prior to discharge. Patient is comfortable with above plan and is stable for discharge at this time. All questions were answered prior to disposition. Strict return precautions for returning to the ED were discussed. Encouraged follow up with PCP.   An After Visit Summary was printed and given to the patient.   Portions of this note were generated with [employer]. Dictation errors may occur despite best attempts  at proofreading.  Final Clinical Impression(s) / ED Diagnoses Final diagnoses:  Essential hypertension  Educated about COVID-19 virus infection    Rx / DC Orders ED Discharge Orders    None       Delia Heady, PA-C 07/04/19 1701    Blanchie Dessert, MD 07/04/19 [phone]

## 2019-07-04 NOTE — ED Notes (Signed)
Pt ambulated to bathroom with no assistance.  

## 2019-07-04 NOTE — Discharge Instructions (Addendum)
Take your iron supplements as tolerated. Follow-up with your primary care provider about management of your high blood pressure. Return to the ED if you start to develop worsening symptoms, chest pain, shortness of breath, blurry vision, injuries or falls.

## 2019-07-04 NOTE — ED Triage Notes (Addendum)
Pt presents with c/o hypertension, hx of same. Pt reports a headache today. Pt also has some swelling in her feet which is new for the patient. Pt's last BP for EMS was 176/84. Pt does take medication for her BP, no recent med adjustments. Pt reports some urinary frequency and one episode of incontinence last night.

## 2019-07-05 LAB — SARS CORONAVIRUS 2 (TAT 6-24 HRS): SARS Coronavirus 2: NEGATIVE

## 2019-07-06 DIAGNOSIS — F329 Major depressive disorder, single episode, unspecified: Secondary | ICD-10-CM | POA: Diagnosis not present

## 2019-07-06 DIAGNOSIS — I10 Essential (primary) hypertension: Secondary | ICD-10-CM | POA: Diagnosis not present

## 2019-07-06 DIAGNOSIS — R609 Edema, unspecified: Secondary | ICD-10-CM | POA: Diagnosis not present

## 2019-07-06 DIAGNOSIS — E1165 Type 2 diabetes mellitus with hyperglycemia: Secondary | ICD-10-CM | POA: Diagnosis not present

## 2019-07-06 DIAGNOSIS — G44219 Episodic tension-type headache, not intractable: Secondary | ICD-10-CM | POA: Diagnosis not present

## 2019-07-06 DIAGNOSIS — E7800 Pure hypercholesterolemia, unspecified: Secondary | ICD-10-CM | POA: Diagnosis not present

## 2019-07-06 DIAGNOSIS — K219 Gastro-esophageal reflux disease without esophagitis: Secondary | ICD-10-CM | POA: Diagnosis not present

## 2019-07-06 DIAGNOSIS — F17200 Nicotine dependence, unspecified, uncomplicated: Secondary | ICD-10-CM | POA: Diagnosis not present

## 2019-07-06 DIAGNOSIS — E1140 Type 2 diabetes mellitus with diabetic neuropathy, unspecified: Secondary | ICD-10-CM | POA: Diagnosis not present

## 2019-07-08 ENCOUNTER — Ambulatory Visit (INDEPENDENT_AMBULATORY_CARE_PROVIDER_SITE_OTHER): Payer: Medicare HMO | Admitting: Cardiology

## 2019-07-08 ENCOUNTER — Other Ambulatory Visit: Payer: Self-pay | Admitting: Cardiology

## 2019-07-08 ENCOUNTER — Encounter: Payer: Self-pay | Admitting: Cardiology

## 2019-07-08 ENCOUNTER — Other Ambulatory Visit: Payer: Self-pay

## 2019-07-08 VITALS — BP 169/94 | HR 62 | Temp 98.1°F | Resp 16 | Ht 60.0 in | Wt 207.6 lb

## 2019-07-08 DIAGNOSIS — I5032 Chronic diastolic (congestive) heart failure: Secondary | ICD-10-CM

## 2019-07-08 DIAGNOSIS — F17210 Nicotine dependence, cigarettes, uncomplicated: Secondary | ICD-10-CM

## 2019-07-08 DIAGNOSIS — D509 Iron deficiency anemia, unspecified: Secondary | ICD-10-CM | POA: Diagnosis not present

## 2019-07-08 DIAGNOSIS — I10 Essential (primary) hypertension: Secondary | ICD-10-CM

## 2019-07-08 DIAGNOSIS — F17200 Nicotine dependence, unspecified, uncomplicated: Secondary | ICD-10-CM

## 2019-07-08 MED ORDER — SPIRONOLACTONE 50 MG PO TABS: 50.0000 mg | ORAL_TABLET | Freq: Every day | ORAL | 1 refills | Status: DC

## 2019-07-08 NOTE — Progress Notes (Signed)
Primary Physician:  Benito Mccreedy, MD   Patient ID: [name], female    DOB: 04-27-1951, 69 y.o.   MRN: [id number]  Subjective:    Chief Complaint  Patient presents with  . Hypertension    HPI: [name]  is a 69 y.o. female  with hypertension, hyperlipidemia, moderate CAD being treated medically and known peripheral arterial disease s/p PV angiogram with revascularization of SFA and has not had reoccurence of claudication, history of prior GI bleed in May 2014 and again in July 4th 2017, Upper GI and lower GI evaluation revealed multiple non-bleeding AVMs in the jejunum and colonic polyps and diverticular disease - Dr. Benson Norway. . She has not had any further recurrence of GI bleed. She underwent exercise nuclear stress test in which her excess capacity was low on 05/19/2017 and is no evidence of ischemia and she achieved only 83% of MPHR. Also underwent LE arterial duplex on 04/15/2018, revealing right ABI has improved from 0.5 to 0.86, suggesting patent right SFA angioplasty site.  She made an appointment to see me today after recently being seen in ER on 07/04/19 for elevated blood pressure and generalized swelling. She was found to be hypertensive, but not felt to be in heart failure exacerbation. She was found to be significantly anemic; however, hemoccult was negative. She has been started on iron supplements. Reports that she is feeling some better than when she was in the ER. She has continued to note that her BP is elevated. Taking her medications.  She is now following Dr. Junius Roads for injury to her left knee, and underwent cortisone injection in Dec 2020.   She denies any chest pain or worsening dyspnea on exertion.   Urinary incontinence is unchanged. Unfortunately, she continues to smoke 1/2 pack of cigarettes daily. Reports moving into a new apartment and has been unable to keep her dog. She is requesting a letter to help her with this. She is tearful  when discussing the situation.  Past Medical History:  Diagnosis Date  . Anemia 12/26/2015  . Anxiety   . Arthritis   . Blood transfusion without reported diagnosis   . Chronic pain   . COPD (chronic obstructive pulmonary disease) ([place])   . Deep vein blood clot of right lower extremity ([place]) 12/29/2012   Dr. Nadyne Coombes, stent in place  . Depression   . Diabetes mellitus without complication ([place])   . GERD (gastroesophageal reflux disease)   . Heart murmur   . Hyperlipemia   . Hypertension   . Sleep apnea    uses a cpap  . Wears glasses     Past Surgical History:  Procedure Laterality Date  . ABDOMINAL HYSTERECTOMY    . CARDIAC CATHETERIZATION  4/15  . CATARACT EXTRACTION    . CESAREAN SECTION    . COLONOSCOPY N/A 01/08/2013   Procedure: COLONOSCOPY;  Surgeon: Beryle Beams, MD;  Location: [facility] ENDOSCOPY;  Service: Endoscopy;  Laterality: N/A;  . COLONOSCOPY N/A 12/28/2015   Procedure: COLONOSCOPY;  Surgeon: Carol Ada, MD;  Location: [facility] ENDOSCOPY;  Service: Endoscopy;  Laterality: N/A;  . ENTEROSCOPY N/A 12/28/2015   Procedure: ENTEROSCOPY;  Surgeon: Carol Ada, MD;  Location: [facility];  Service: Endoscopy;  Laterality: N/A;  . ENTEROSCOPY N/A 02/20/2018   Procedure: ENTEROSCOPY;  Surgeon: Carol Ada, MD;  Location: [facility] ENDOSCOPY;  Service: Endoscopy;  Laterality: N/A;  . ENTEROSCOPY N/A 03/27/2018   Procedure: ENTEROSCOPY;  Surgeon: Carol Ada, MD;  Location: [facility] ENDOSCOPY;  Service:  Endoscopy;  Laterality: N/A;  . ESOPHAGOGASTRODUODENOSCOPY N/A 01/08/2013   Procedure: ESOPHAGOGASTRODUODENOSCOPY (EGD);  Surgeon: Beryle Beams, MD;  Location: [facility];  Service: Endoscopy;  Laterality: N/A;  . EYE SURGERY    . HAND SURGERY    . HOT HEMOSTASIS N/A 12/28/2015   Procedure: HOT HEMOSTASIS (ARGON PLASMA COAGULATION/BICAP);  Surgeon: Carol Ada, MD;  Location[facility]COPY;  Service: Endoscopy;  Laterality: N/A;  . HOT HEMOSTASIS N/A 02/20/2018   Procedure: HOT HEMOSTASIS  (ARGON PLASMA COAGULATION/BICAP);  Surgeon: Carol Ada, MD;  Location: [facility] ENDOSCOPY;  Service: Endoscopy;  Laterality: N/A;  . HOT HEMOSTASIS N/A 03/27/2018   Procedure: HOT HEMOSTASIS (ARGON PLASMA COAGULATION/BICAP);  Surgeon: Carol Ada, MD;  Location: [facility] ENDOSCOPY;  Service: Endoscopy;  Laterality: N/A;  . LEFT HEART CATHETERIZATION WITH CORONARY ANGIOGRAM N/A 03/09/2013   Procedure: LEFT HEART CATHETERIZATION WITH CORONARY ANGIOGRAM;  Surgeon: Laverda Page, MD;  Location: [facility] CATH LAB;  Service: Cardiovascular;  Laterality: N/A;  . LOWER EXTREMITY ANGIOGRAM N/A 12/29/2012   Procedure: LOWER EXTREMITY ANGIOGRAM;  Surgeon: Laverda Page, MD;  Location: [facility] CATH LAB;  Service: Cardiovascular;  Laterality: N/A;  . LOWER EXTREMITY ANGIOGRAM N/A 10/05/2013   Procedure: LOWER EXTREMITY ANGIOGRAM;  Surgeon: Laverda Page, MD;  Location: [facility] CATH LAB;  Service: Cardiovascular;  Laterality: N/A;  . LOWER EXTREMITY ANGIOGRAPHY N/A 02/11/2017   Procedure: Lower Extremity Angiography;  Surgeon: Adrian Prows, MD;  Location: [place] CV LAB;  Service: Cardiovascular;  Laterality: N/A;  . PERIPHERAL VASCULAR BALLOON ANGIOPLASTY  02/11/2017   Procedure: PERIPHERAL VASCULAR BALLOON ANGIOPLASTY;  Surgeon: Adrian Prows, MD;  Location: [place] CV LAB;  Service: Cardiovascular;;  Right SFA  . PERIPHERAL VASCULAR INTERVENTION Right 02/11/2017   Procedure: PERIPHERAL VASCULAR INTERVENTION;  Surgeon: Adrian Prows, MD;  Location: [place] CV LAB;  Service: Cardiovascular;  Laterality: Right;  Rt SFA  . stent in right leg  12/29/2012   for blood clot, Dr. Nadyne Coombes  . TYMPANOMASTOIDECTOMY Left 03/08/2014   Procedure: TYMPANOMASTOIDECTOMY LEFT;  Surgeon: Ascencion Dike, MD;  Location: [place];  Service: ENT;  Laterality: Left;  . TYMPANOMASTOIDECTOMY  2015  . UTERINE FIBROID SURGERY      Social History   Socioeconomic History  . Marital status: Divorced    Spouse name: Not on file  . Number  of children: 3  . Years of education: Not on file  . Highest education level: Not on file  Occupational History  . Not on file  Tobacco Use  . Smoking status: Current Every Day Smoker    Packs/day: 0.50    Years: 41.00    Pack years: 20.50    Types: Cigarettes  . Smokeless tobacco: Never Used  Substance and Sexual Activity  . Alcohol use: Yes    Comment: rarely   . Drug use: Yes    Types: Marijuana    Comment: last-02/24/14  . Sexual activity: Not on file  Other Topics Concern  . Not on file  Social History Narrative  . Not on file   Social Determinants of Health   Financial Resource Strain:   . Difficulty of Paying Living Expenses: Not on file  Food Insecurity:   . Worried About Charity fundraiser in the Last Year: Not on file  . Ran Out of Food in the Last Year: Not on file  Transportation Needs:   . Lack of Transportation (Medical): Not on file  . Lack of Transportation (Non-Medical): Not on file  Physical Activity:   . Days of Exercise per Week: Not on file  . Minutes of Exercise per Session: Not on file  Stress:   . Feeling of Stress : Not on file  Social Connections:   . Frequency of Communication with Friends and Family: Not on file  . Frequency of Social Gatherings with Friends and Family: Not on file  . Attends Religious Services: Not on file  . Active Member of Clubs or Organizations: Not on file  . Attends Archivist Meetings: Not on file  . Marital Status: Not on file  Intimate Partner Violence:   . Fear of Current or Ex-Partner: Not on file  . Emotionally Abused: Not on file  . Physically Abused: Not on file  . Sexually Abused: Not on file    Review of Systems  Constitution: Negative for decreased appetite, malaise/fatigue, weight gain and weight loss.  Eyes: Negative for visual disturbance.  Cardiovascular: Positive for dyspnea on exertion (chronic, stable). Negative for chest pain, claudication, leg swelling, orthopnea, palpitations and  syncope.  Respiratory: Negative for hemoptysis and wheezing.   Endocrine: Negative for cold intolerance and heat intolerance.  Hematologic/Lymphatic: Does not bruise/bleed easily.  Skin: Negative for nail changes.  Musculoskeletal: Negative for muscle weakness and myalgias.  Gastrointestinal: Negative for abdominal pain, change in bowel habit, nausea and vomiting.  Genitourinary: Positive for bladder incontinence.  Neurological: Positive for sensory change (occasional tingling sensation in her right thigh). Negative for difficulty with concentration, dizziness, focal weakness and headaches.  Psychiatric/Behavioral: Negative for altered mental status and suicidal ideas.  All other systems reviewed and are negative.     Objective:  Blood pressure (!) 169/94, pulse 62, temperature 98.1 F (36.7 C), temperature source Oral, resp. rate 16, height 5' (1.524 m), weight 207 lb 9.6 oz (94.2 kg), SpO2 97 %. Body mass index is 40.54 kg/m.    Physical Exam  Constitutional: She is oriented to person, place, and time. Vital signs are normal. She appears well-developed and well-nourished.  HENT:  Head: Normocephalic and atraumatic.  Neck: No tracheal deviation present. No thyromegaly present.  Cardiovascular: Normal rate, regular rhythm and intact distal pulses.  Murmur heard.  Blowing early systolic murmur is present with a grade of 2/6 at the upper right sternal border radiating to the neck. Pulses:      Carotid pulses are on the right side with bruit and on the left side with bruit.      Femoral pulses are 1+ on the right side and 1+ on the left side.      Popliteal pulses are 1+ on the right side and 1+ on the left side.       Dorsalis pedis pulses are 2+ on the right side and 1+ on the left side.       Posterior tibial pulses are 0 on the right side and 1+ on the left side.  Pulses difficult to palpate due to bodily habitus.  Pulmonary/Chest: Effort normal and breath sounds normal. No  accessory muscle usage. No respiratory distress.  Abdominal: Soft. Bowel sounds are normal.  Musculoskeletal:        General: Normal range of motion.     Cervical back: Normal range of motion and neck supple.  Neurological: She is alert and oriented to person, place, and time.  Skin: Skin is warm and dry.  Psychiatric: She has a normal mood and affect.  Vitals reviewed.  Radiology: No results found.  Laboratory examination:    CMP  Latest Ref Rng & Units 07/04/2019 12/21/2018 09/07/2018  Glucose 70 - 99 mg/dL 91 119(H) 81  BUN 8 - 23 mg/dL 17 13 17   Creatinine 0.44 - 1.00 mg/dL 0.85 0.96 1.01(H)  Sodium 135 - 145 mmol/L 142 141 140  Potassium 3.5 - 5.1 mmol/L 4.0 3.0(L) 4.1  Chloride 98 - 111 mmol/L 107 104 101  CO2 22 - 32 mmol/L 27 27 25   Calcium 8.9 - 10.3 mg/dL 8.9 9.4 9.9  Total Protein 6.5 - 8.1 g/dL - 7.2 -  Total Bilirubin 0.3 - 1.2 mg/dL - 0.8 -  Alkaline Phos 38 - 126 U/L - 71 -  AST 15 - 41 U/L - 19 -  ALT 0 - 44 U/L - 10 -   CBC Latest Ref Rng & Units 07/04/2019 12/21/2018 12/21/2018  WBC 4.0 - 10.5 K/uL 7.8 6.9 7.1  Hemoglobin 12.0 - 15.0 g/dL 9.5(L) 14.9 14.7  Hematocrit 36.0 - 46.0 % 34.3(L) 46.1(H) 45.8  Platelets 150 - 400 K/uL 542(H) 351 354   Lipid Panel     Component Value Date/Time   CHOL 166 05/12/2012 0237   TRIG 196 (H) 05/12/2012 0237   HDL 37 (L) 05/12/2012 0237   CHOLHDL 4.5 05/12/2012 0237   VLDL 39 05/12/2012 0237   LDLCALC 90 05/12/2012 0237   HEMOGLOBIN A1C Lab Results  Component Value Date   HGBA1C 5.6 12/26/2015   MPG 114 12/26/2015   TSH No results for input(s): TSH in the last 8760 hours.  PRN Meds:. Medications Discontinued During This Encounter  Medication Reason  . celecoxib (CELEBREX) 200 MG capsule Error  . loperamide (IMODIUM) 2 MG capsule Error  . spironolactone (ALDACTONE) 25 MG tablet Dose change   Current Meds  Medication Sig  . albuterol (PROVENTIL HFA;VENTOLIN HFA) 108 (90 Base) MCG/ACT inhaler Inhale 1-2 puffs  into the lungs every 6 (six) hours as needed for wheezing or shortness of breath.  . Ascorbic Acid (VITAMIN C PO) Take 1 tablet by mouth daily.  Marland Kitchen aspirin EC 81 MG tablet Take 81 mg by mouth daily.   . ferrous sulfate 325 (65 FE) MG EC tablet Take 1 tablet by mouth daily.  . furosemide (LASIX) 20 MG tablet Take 20 mg by mouth daily.  Marland Kitchen gabapentin (NEURONTIN) 600 MG tablet Take 1 tablet (600 mg total) by mouth at bedtime. (Patient taking differently: Take 600 mg by mouth at bedtime as needed (pain). )  . hydrALAZINE (APRESOLINE) 50 MG tablet Take 1 tablet (50 mg total) by mouth 3 (three) times daily.  Marland Kitchen losartan-hydrochlorothiazide (HYZAAR) 100-25 MG tablet Take 1 tablet by mouth daily.  . meloxicam (MOBIC) 7.5 MG tablet Take 2 tablets (15 mg total) by mouth daily. (Patient taking differently: Take 7.5 mg by mouth daily. )  . metFORMIN (GLUCOPHAGE) 500 MG tablet Take 0.5 tablets (250 mg total) by mouth 2 (two) times daily with a meal.  . pantoprazole (PROTONIX) 40 MG tablet Take 40 mg by mouth daily.   . pravastatin (PRAVACHOL) 20 MG tablet Take 20 mg by mouth daily.  . Vitamin D, Ergocalciferol, (DRISDOL) 1.25 MG (50000 UT) CAPS capsule Take 50,000 Units by mouth once a week.    Cardiac Studies:   Echocardiogram 02/18/2019: Left ventricle cavity is normal in size. Mild concentric hypertrophy of the left ventricle. Normal LV systolic function with EF 68%. Normal global wall motion. Doppler evidence of grade I (impaired) diastolic dysfunction, normal LAP. Calculated EF 68%. Trileaflet aortic valve with mild calcification. Mild aortic valve  stenosis. Aortic valve mean gradient of 12 mmHg, Vmax of 2.4  m/s. Calculated aortic valve area by continuity equation is 1.4 cm. No regurgitation. Trace mitral regurgitation. Trace pulmonic regurgitation. Mild tricuspid regurgitation. Estimated pulmonary artery systolic pressure is 32 mmHg.  Exercise myoview stress 05/19/2017: 1. Patient achieved 4.4 METS  and reached 83% target heart rate. Exercise capacity low for age. Appropriate heart rate and blood pressure response. Stress symptoms included dyspnea, dizziness. Submaximal exercise stress study. 2. No stress EKG changes suggestive of ischemia. 3. Stress and rest SPECT images demonstrate homogeneous tracer distribution throughout the myocardium. Gated SPECT imaging reveals normal myocardial thickening and wall motion. The left ventricular ejection fraction was normal (65%).  4. This is a low risk submaximal exercise stress study   Peripheral arteriogram 02/11/2017: Successful PTA with DCB 5x150x2 InPact Admiral in the right proximal to distal SFA and stenting of proximal SFA with DES, [place] 6x100 mm stent. 3 vessel r/o. Left mild disease and 2 vessel R/O. Mild disease left by angiogram 10/05/2013. Lower extremity arterial duplex 04/15/2018: No hemodynamically significant stenoses are identified in the lower extremity arterial system. Mild diffuse calcific plaque noted. This exam reveals mildly decreased perfusion of the right lower extremity, noted at the anterior tibial artery level (ABI 0.86) and normal perfusion of the left lower extremity (ABI 0.97). No signficant change since 05/28/2017. Study suggests patent right SFA angioplasty site.  Lower extremity arterial duplex 04/15/2018: No hemodynamically significant stenoses are identified in the lower extremity arterial system. Mild diffuse calcific plaque noted. This exam reveals mildly decreased perfusion of the right lower extremity, noted at the anterior tibial artery level (ABI 0.86) and normal perfusion of the left lower extremity (ABI 0.97). No signficant change since 05/28/2017. Study suggests patent right SFA angioplasty site.  Carotid artery duplex 07/25/2015: Antegrade right vertebral artery flow. Antegrade left vertebral artery flow. No hemodynamically significant arterial disease in the internal carotid artery bilaterally. No  significant change from 11/26/2012.  Coronary angiogram 02/27/2014: Mid RCA 60-70% stenosis, mid LAD 60-70% stenosis. FFR to the LAD, lesion insignificant. Medical therapy for CAD. Moderate aortic valve calcification.  Assessment:     ICD-10-CM   1. Essential hypertension  I10 EKG [id number]    Basic metabolic panel    Basic metabolic panel  2. Chronic diastolic (congestive) heart failure (HCC)  I50.32   3. Tobacco use disorder  F17.200   4. Iron deficiency anemia, unspecified iron deficiency anemia type  D50.9    EKG 07/08/2019: Normal sinus rhythm at 66 bpm with 1 PAC and 1 PVC, normal axis, PRWP cannot exclude anterior infarct old. No evidence of ischemia.   Recommendations:   Patient made an appointment to see me today due to elevated blood pressure.  She was recently seen in the emergency room with leg swelling and elevated blood pressure.  Found to be significantly anemic of unknown etiology, has been started on iron supplements. No bleeding reported. She will follow-up with her PCP regarding this.  Her blood pressure is also elevated today and has trace leg edema on exam.  I will increase her Aldactone to 50 mg daily.  Will check BMP in 2 weeks for surveillance of her kidney function.  She has not had any symptoms of angina.  Suspect poor dietary compliance has contributed to elevated blood pressure and leg edema.    She is tearful on exam today as she was recently moved into a new apartment complex in which she has been unable to keep her  dog, she asked me for a letter to help her with being able to continue to keep her dog.  I do feel that her pet provide some emotional support for her and also gives her recent to walk regularly which she needs from a CAD and PAD standpoint.  Will provide letter patient hopefully assist her with this.  I will see her back in 4 weeks for follow-up on hypertension.   Miquel Dunn, MSN, APRN, FNP-C [facility]. [place] Office:  [phone] Fax: [phone]

## 2019-07-16 ENCOUNTER — Other Ambulatory Visit: Payer: Self-pay

## 2019-07-16 ENCOUNTER — Ambulatory Visit (INDEPENDENT_AMBULATORY_CARE_PROVIDER_SITE_OTHER): Payer: Medicare HMO | Admitting: Family Medicine

## 2019-07-16 ENCOUNTER — Encounter: Payer: Self-pay | Admitting: Family Medicine

## 2019-07-16 DIAGNOSIS — M25562 Pain in left knee: Secondary | ICD-10-CM | POA: Diagnosis not present

## 2019-07-16 MED ORDER — CELECOXIB 200 MG PO CAPS: 200.0000 mg | ORAL_CAPSULE | Freq: Two times a day (BID) | ORAL | 6 refills | Status: DC | PRN

## 2019-07-16 NOTE — Progress Notes (Signed)
Office Visit Note   Patient: [name]           Date of Birth: Jul 12, 1950           MRN: [id number] Visit Date: 07/16/2019 Requested by: Benito Mccreedy, MD [address] [place],  [place] [address] PCP: Benito Mccreedy, MD  Subjective: Chief Complaint  Patient presents with  . Left Knee - Pain    Persistent pain in the knee, since 06/05/19 (when the knee popped when bending to pick up bottles of water from the car). No relief from the cortisone injection 06/15/19.    HPI: She is here for follow-up left knee pain.  Last time cortisone injection unfortunately did not help.  Pain on the medial aspect, feels like it is going to give way when she walks.  Very painful going up stairs.              ROS:   All other systems were reviewed and are negative.  Objective: Vital Signs: There were no vitals taken for this visit.  Physical Exam:  General:  Alert and oriented, in no acute distress. Pulm:  Breathing unlabored. Psy:  Normal mood, congruent affect.  Left knee: 1+ effusion with no warmth.  Trace patellofemoral crepitus.  Full active extension, flexion of 120 degrees.  Very tender on the medial joint line with pain but no definite click with McMurray's.  Imaging: None today  Assessment & Plan: 1.  Persistent left knee pain, suspect medial meniscus tear -MRI to further evaluate.  Surgical consult if indicated. -Celebrex as needed.     Procedures: No procedures performed  No notes on file     PMFS History: Patient Active Problem List   Diagnosis Date Noted  . Tobacco use disorder 09/18/2018  . Iron deficiency anemia 04/09/2018  . Symptomatic anemia 12/26/2015  . Hypokalemia 12/26/2015  . Type 2 diabetes mellitus with peripheral vascular disease ([place]) 12/26/2015  . Claudication in peripheral vascular disease ([place]) 10/05/2013  . Discomfort in chest 05/11/2012  . Nicotine dependence 05/11/2012  . Bradycardia 05/11/2012  . HTN  (hypertension) 05/11/2012  . Back pain 05/11/2012   Past Medical History:  Diagnosis Date  . Anemia 12/26/2015  . Anxiety   . Arthritis   . Blood transfusion without reported diagnosis   . Chronic pain   . COPD (chronic obstructive pulmonary disease) ([place])   . Deep vein blood clot of right lower extremity ([place]) 12/29/2012   Dr. Nadyne Coombes, stent in place  . Depression   . Diabetes mellitus without complication ([place])   . GERD (gastroesophageal reflux disease)   . Heart murmur   . Hyperlipemia   . Hypertension   . Sleep apnea    uses a cpap  . Wears glasses     Family History  Problem Relation Age of Onset  . Diabetes Mother   . Hypertension Mother   . Heart disease Mother   . Heart disease Father   . Hypertension Father     Past Surgical History:  Procedure Laterality Date  . ABDOMINAL HYSTERECTOMY    . CARDIAC CATHETERIZATION  4/15  . CATARACT EXTRACTION    . CESAREAN SECTION    . COLONOSCOPY N/A 01/08/2013   Procedure: COLONOSCOPY;  Surgeon: Beryle Beams, MD;  Location: [facility] ENDOSCOPY;  Service: Endoscopy;  Laterality: N/A;  . COLONOSCOPY N/A 12/28/2015   Procedure: COLONOSCOPY;  Surgeon: Carol Ada, MD;  Location: [facility] ENDOSCOPY;  Service: Endoscopy;  Laterality: N/A;  .  ENTEROSCOPY N/A 12/28/2015   Procedure: ENTEROSCOPY;  Surgeon: Carol Ada, MD;  Location: [facility] ENDOSCOPY;  Service: Endoscopy;  Laterality: N/A;  . ENTEROSCOPY N/A 02/20/2018   Procedure: ENTEROSCOPY;  Surgeon: Carol Ada, MD;  Location: [facility] ENDOSCOPY;  Service: Endoscopy;  Laterality: N/A;  . ENTEROSCOPY N/A 03/27/2018   Procedure: ENTEROSCOPY;  Surgeon: Carol Ada, MD;  Location: [facility] ENDOSCOPY;  Service: Endoscopy;  Laterality: N/A;  . ESOPHAGOGASTRODUODENOSCOPY N/A 01/08/2013   Procedure: ESOPHAGOGASTRODUODENOSCOPY (EGD);  Surgeon: Beryle Beams, MD;  Location: [facility] END[facility];  Service: Endoscopy;  Laterality: N/A;  . EYE SURGERY    . HAND SURGERY    . HOT HEMOSTASIS N/A 12/28/2015   Procedure: HOT  HEMOSTASIS (ARGON PLASMA COAGULATION/BICAP);  Surgeon: Carol Ada, MD;  Location: [facility] ENDOSCOPY;  Service: Endoscopy;  Laterality: N/A;  . HOT HEMOSTASIS N/A 02/20/2018   Procedure: HOT HEMOSTASIS (ARGON PLASMA COAGULATION/BICAP);  Surgeon: Carol Ada, MD;  Location: [facility] ENDOSCOPY;  Service: Endoscopy;  Laterality: N/A;  . HOT HEMOSTASIS N/A 03/27/2018   Procedure: HOT HEMOSTASIS (ARGON PLASMA COAGULATION/BICAP);  Surgeon: Carol Ada, MD;  Location: [facility] ENDOSCOPY;  Service: Endoscopy;  Laterality: N/A;  . LEFT HEART CATHETERIZATION WITH CORONARY ANGIOGRAM N/A 03/09/2013   Procedure: LEFT HEART CATHETERIZATION WITH CORONARY ANGIOGRAM;  Surgeon: Laverda Page, MD;  Location: [facility] CATH LAB;  Service: Cardiovascular;  Laterality: N/A;  . LOWER EXTREMITY ANGIOGRAM N/A 12/29/2012   Procedure: LOWER EXTREMITY ANGIOGRAM;  Surgeon: Laverda Page, MD;  Location: [facility] CATH LAB;  Service: Cardiovascular;  Laterality: N/A;  . LOWER EXTREMITY ANGIOGRAM N/A 10/05/2013   Procedure: LOWER EXTREMITY ANGIOGRAM;  Surgeon: Laverda Page, MD;  Location: [facility] CATH LAB;  Service: Cardiovascular;  Laterality: N/A;  . LOWER EXTREMITY ANGIOGRAPHY N/A 02/11/2017   Procedure: Lower Extremity Angiography;  Surgeon: Adrian Prows, MD;  Location: [place] CV LAB;  Service: Cardiovascular;  Laterality: N/A;  . PERIPHERAL VASCULAR BALLOON ANGIOPLASTY  02/11/2017   Procedure: PERIPHERAL VASCULAR BALLOON ANGIOPLASTY;  Surgeon: Adrian Prows, MD;  Location: [facility] CV LAB;  Service: Cardiovascular;;  Right SFA  . PERIPHERAL VASCULAR INTERVENTION Right 02/11/2017   Procedure: PERIPHERAL VASCULAR INTERVENTION;  Surgeon: Adrian Prows, MD;  Location: [facility] CV LAB;  Service: Cardiovascular;  Laterality: Right;  Rt SFA  . stent in right leg  12/29/2012   for blood clot, Dr. Nadyne Coombes  . TYMPANOMASTOIDECTOMY Left 03/08/2014   Procedure: TYMPANOMASTOIDECTOMY LEFT;  Surgeon: Ascencion Dike, MD;  Location: [facility];  Service:  ENT;  Laterality: Left;  . TYMPANOMASTOIDECTOMY  2015  . UTERINE FIBROID SURGERY     Social History   Occupational History  . Not on file  Tobacco Use  . Smoking status: Current Every Day Smoker    Packs/day: 0.50    Years: 41.00    Pack years: 20.50    Types: Cigarettes  . Smokeless tobacco: Never Used  Substance and Sexual Activity  . Alcohol use: Yes    Comment: rarely   . Drug use: Yes    Types: Marijuana    Comment: last-02/24/14  . Sexual activity: Not on file

## 2019-07-21 ENCOUNTER — Ambulatory Visit: Payer: Medicare HMO | Admitting: Cardiology

## 2019-07-23 DIAGNOSIS — I10 Essential (primary) hypertension: Secondary | ICD-10-CM | POA: Diagnosis not present

## 2019-07-24 LAB — BASIC METABOLIC PANEL
BUN/Creatinine Ratio: 19 (ref 12–28)
BUN: 17 mg/dL (ref 8–27)
CO2: 24 mmol/L (ref 20–29)
Calcium: 10.3 mg/dL (ref 8.7–10.3)
Chloride: 100 mmol/L (ref 96–106)
Creatinine, Ser: 0.91 mg/dL (ref 0.57–1.00)
GFR calc Af Amer: 75 mL/min/{1.73_m2} (ref >59)
GFR calc non Af Amer: 65 mL/min/{1.73_m2} (ref >59)
Glucose: 91 mg/dL (ref 65–99)
Potassium: 4.3 mmol/L (ref 3.5–5.2)
Sodium: 142 mmol/L (ref 134–144)

## 2019-08-06 ENCOUNTER — Other Ambulatory Visit: Payer: Self-pay

## 2019-08-06 ENCOUNTER — Other Ambulatory Visit: Payer: Self-pay | Admitting: Cardiology

## 2019-08-06 ENCOUNTER — Telehealth: Payer: Self-pay | Admitting: Family Medicine

## 2019-08-06 ENCOUNTER — Ambulatory Visit: Payer: Medicare HMO | Admitting: Cardiology

## 2019-08-06 ENCOUNTER — Encounter: Payer: Self-pay | Admitting: Cardiology

## 2019-08-06 ENCOUNTER — Ambulatory Visit
Admission: RE | Admit: 2019-08-06 | Discharge: 2019-08-06 | Disposition: A | Discharge status: Home / Self Care | Payer: Medicare HMO | Source: Ambulatory Visit | Attending: Family Medicine | Admitting: Family Medicine

## 2019-08-06 VITALS — BP 147/74 | HR 57 | Temp 96.4°F | Ht 60.0 in | Wt 208.0 lb

## 2019-08-06 DIAGNOSIS — F17200 Nicotine dependence, unspecified, uncomplicated: Secondary | ICD-10-CM

## 2019-08-06 DIAGNOSIS — I10 Essential (primary) hypertension: Secondary | ICD-10-CM | POA: Diagnosis not present

## 2019-08-06 DIAGNOSIS — D509 Iron deficiency anemia, unspecified: Secondary | ICD-10-CM

## 2019-08-06 DIAGNOSIS — M25562 Pain in left knee: Secondary | ICD-10-CM | POA: Diagnosis not present

## 2019-08-06 DIAGNOSIS — I5032 Chronic diastolic (congestive) heart failure: Secondary | ICD-10-CM

## 2019-08-06 MED ORDER — HYDRALAZINE HCL 100 MG PO TABS: 100.0000 mg | ORAL_TABLET | Freq: Three times a day (TID) | ORAL | 1 refills | Status: DC

## 2019-08-06 NOTE — Telephone Encounter (Signed)
MRI shows medial and lateral meniscus cartilage tears.  This could be the source of your pain.  If knee is still bothering you, I'll arrange consultation with surgeon for possible arthroscopic surgery.

## 2019-08-06 NOTE — Progress Notes (Signed)
Primary Physician:  Benito Mccreedy, MD   Patient ID: [name], female    DOB: Apr 12, 1951, 69 y.o.   MRN: [id number]  Subjective:    Chief Complaint  Patient presents with  . Hypertension  . Follow-up    4 week    HPI: [name]  is a 69 y.o. female  with hypertension, hyperlipidemia, moderate CAD being treated medically and known peripheral arterial disease s/p PV angiogram with revascularization of SFA and has not had reoccurence of claudication, history of prior GI bleed in May 2014 and again in July 4th 2017, Upper GI and lower GI evaluation revealed multiple non-bleeding AVMs in the jejunum and colonic polyps and diverticular disease - Dr. Benson Norway. She has not had any further recurrence of GI bleed. She underwent exercise nuclear stress test in which her excess capacity was low on 05/19/2017 and is no evidence of ischemia and she achieved only 83% of MPHR. Also underwent LE arterial duplex on 04/15/2018, revealing right ABI has improved from 0.5 to 0.86, suggesting patent right SFA angioplasty site.  She was recently seen for acute visit for uncontrolled hypertension after being seen in ER. Aldactone was increased to 50 mg daily. She now presents for follow up.   She has recently been found to be significantly anemic; however, hemoccult was negative. She has been started on iron supplements. She has had follow up with her PCP, but is unsure if her anemia is improving. She has not had any bleeding.   She is now following Dr. Junius Roads for injury to her left knee, and underwent cortisone injection in Dec 2020. She has underwent MRI and is still having issues with her knee.  She denies any chest pain or worsening dyspnea on exertion.   Urinary incontinence is unchanged. Unfortunately, she continues to smoke 1/2 pack of cigarettes daily. She was able to get her dog back and has since been walking more.  Past Medical History:  Diagnosis Date  . Anemia  12/26/2015  . Anxiety   . Arthritis   . Blood transfusion without reported diagnosis   . Chronic pain   . COPD (chronic obstructive pulmonary disease) ([place])   . Deep vein blood clot of right lower extremity ([place]) 12/29/2012   Dr. Nadyne Coombes, stent in place  . Depression   . Diabetes mellitus without complication ([place])   . GERD (gastroesophageal reflux disease)   . Heart murmur   . Hyperlipemia   . Hypertension   . Sleep apnea    uses a cpap  . Wears glasses     Past Surgical History:  Procedure Laterality Date  . ABDOMINAL HYSTERECTOMY    . CARDIAC CATHETERIZATION  4/15  . CATARACT EXTRACTION    . CESAREAN SECTION    . COLONOSCOPY N/A 01/08/2013   Procedure: COLONOSCOPY;  Surgeon: Beryle Beams, MD;  Location: [facility] ENDOSCOPY;  Service: Endoscopy;  Laterality: N/A;  . COLONOSCOPY N/A 12/28/2015   Procedure: COLONOSCOPY;  Surgeon: Carol Ada, MD;  Location: [facility] ENDOSCOPY;  Service: Endoscopy;  Laterality: N/A;  . ENTEROSCOPY N/A 12/28/2015   Procedure: ENTEROSCOPY;  Surgeon: Carol Ada, MD;  Location: [place];  Service: Endoscopy;  Laterality: N/A;  . ENTEROSCOPY N/A 02/20/2018   Procedure: ENTEROSCOPY;  Surgeon: Carol Ada, MD;  Location: [facility] ENDOSCOPY;  Service: Endoscopy;  Laterality: N/A;  . ENTEROSCOPY N/A 03/27/2018   Procedure: ENTEROSCOPY;  Surgeon: Carol Ada, MD;  Location: [facility] ENDOSCOPY;  Service: Endoscopy;  Laterality: N/A;  . ESOPHAGOGASTRODUODENOSCOPY N/A 01/08/2013  Procedure: ESOPHAGOGASTRODUODENOSCOPY (EGD);  Surgeon: Beryle Beams, MD;  Location: [facility] END[facility];  Service: Endoscopy;  Laterality: N/A;  . EYE SURGERY    . HAND SURGERY    . HOT HEMOSTASIS N/A 12/28/2015   Procedure: HOT HEMOSTASIS (ARGON PLASMA COAGULATION/BICAP);  Surgeon: Carol Ada, MD;  Location: [facility] ENDOSCOPY;  Service: Endoscopy;  Laterality: N/A;  . HOT HEMOSTASIS N/A 02/20/2018   Procedure: HOT HEMOSTASIS (ARGON PLASMA COAGULATION/BICAP);  Surgeon: Carol Ada, MD;  Location: [facility] ENDOSCOPY;   Service: Endoscopy;  Laterality: N/A;  . HOT HEMOSTASIS N/A 03/27/2018   Procedure: HOT HEMOSTASIS (ARGON PLASMA COAGULATION/BICAP);  Surgeon: Carol Ada, MD;  Location: [facility] ENDOSCOPY;  Service: Endoscopy;  Laterality: N/A;  . LEFT HEART CATHETERIZATION WITH CORONARY ANGIOGRAM N/A 03/09/2013   Procedure: LEFT HEART CATHETERIZATION WITH CORONARY ANGIOGRAM;  Surgeon: Laverda Page, MD;  Location: [facility] CATH LAB;  Service: Cardiovascular;  Laterality: N/A;  . LOWER EXTREMITY ANGIOGRAM N/A 12/29/2012   Procedure: LOWER EXTREMITY ANGIOGRAM;  Surgeon: Laverda Page, MD;  Location: [facility] CATH LAB;  Service: Cardiovascular;  Laterality: N/A;  . LOWER EXTREMITY ANGIOGRAM N/A 10/05/2013   Procedure: LOWER EXTREMITY ANGIOGRAM;  Surgeon: Laverda Page, MD;  Location: [facility] CATH LAB;  Service: Cardiovascular;  Laterality: N/A;  . LOWER EXTREMITY ANGIOGRAPHY N/A 02/11/2017   Procedure: Lower Extremity Angiography;  Surgeon: Adrian Prows, MD;  Location: [place] CV LAB;  Service: Cardiovascular;  Laterality: N/A;  . PERIPHERAL VASCULAR BALLOON ANGIOPLASTY  02/11/2017   Procedure: PERIPHERAL VASCULAR BALLOON ANGIOPLASTY;  Surgeon: Adrian Prows, MD;  Location: [place] CV LAB;  Service: Cardiovascular;;  Right SFA  . PERIPHERAL VASCULAR INTERVENTION Right 02/11/2017   Procedure: PERIPHERAL VASCULAR INTERVENTION;  Surgeon: Adrian Prows, MD;  Location: [place] CV LAB;  Service: Cardiovascular;  Laterality: Right;  Rt SFA  . stent in right leg  12/29/2012   for blood clot, Dr. Nadyne Coombes  . TYMPANOMASTOIDECTOMY Left 03/08/2014   Procedure: TYMPANOMASTOIDECTOMY LEFT;  Surgeon: Ascencion Dike, MD;  Location: [place];  Service: ENT;  Laterality: Left;  . TYMPANOMASTOIDECTOMY  2015  . UTERINE FIBROID SURGERY      Social History   Socioeconomic History  . Marital status: Divorced    Spouse name: Not on file  . Number of children: 3  . Years of education: Not on file  . Highest education level: Not on  file  Occupational History  . Not on file  Tobacco Use  . Smoking status: Current Every Day Smoker    Packs/day: 0.50    Years: 41.00    Pack years: 20.50    Types: Cigarettes  . Smokeless tobacco: Never Used  Substance and Sexual Activity  . Alcohol use: Yes    Comment: rarely   . Drug use: Yes    Types: Marijuana    Comment: last-02/24/14  . Sexual activity: Not on file  Other Topics Concern  . Not on file  Social History Narrative  . Not on file   Social Determinants of Health   Financial Resource Strain:   . Difficulty of Paying Living Expenses: Not on file  Food Insecurity:   . Worried About Charity fundraiser in the Last Year: Not on file  . Ran Out of Food in the Last Year: Not on file  Transportation Needs:   . Lack of Transportation (Medical): Not on file  . Lack of Transportation (Non-Medical): Not on file  Physical Activity:   . Days of Exercise per Week: Not  on file  . Minutes of Exercise per Session: Not on file  Stress:   . Feeling of Stress : Not on file  Social Connections:   . Frequency of Communication with Friends and Family: Not on file  . Frequency of Social Gatherings with Friends and Family: Not on file  . Attends Religious Services: Not on file  . Active Member of Clubs or Organizations: Not on file  . Attends Archivist Meetings: Not on file  . Marital Status: Not on file  Intimate Partner Violence:   . Fear of Current or Ex-Partner: Not on file  . Emotionally Abused: Not on file  . Physically Abused: Not on file  . Sexually Abused: Not on file    Review of Systems  Constitution: Negative for decreased appetite, malaise/fatigue, weight gain and weight loss.  Eyes: Negative for visual disturbance.  Cardiovascular: Positive for dyspnea on exertion (chronic, stable). Negative for chest pain, claudication, leg swelling, orthopnea, palpitations and syncope.  Respiratory: Negative for hemoptysis and wheezing.   Endocrine: Negative  for cold intolerance and heat intolerance.  Hematologic/Lymphatic: Does not bruise/bleed easily.  Skin: Negative for nail changes.  Musculoskeletal: Negative for muscle weakness and myalgias.  Gastrointestinal: Negative for abdominal pain, change in bowel habit, nausea and vomiting.  Genitourinary: Positive for bladder incontinence.  Neurological: Positive for sensory change (occasional tingling sensation in her right thigh). Negative for difficulty with concentration, dizziness, focal weakness and headaches.  Psychiatric/Behavioral: Negative for altered mental status and suicidal ideas.  All other systems reviewed and are negative.     Objective:  Blood pressure (!) 147/74, pulse (!) 57, temperature (!) 96.4 F (35.8 C), height 5' (1.524 m), weight 208 lb (94.3 kg), SpO2 96 %. Body mass index is 40.62 kg/m.    Physical Exam  Constitutional: She is oriented to person, place, and time. Vital signs are normal. She appears well-developed and well-nourished.  HENT:  Head: Normocephalic and atraumatic.  Neck: No tracheal deviation present. No thyromegaly present.  Cardiovascular: Normal rate, regular rhythm and intact distal pulses.  Murmur heard.  Blowing early systolic murmur is present with a grade of 2/6 at the upper right sternal border radiating to the neck. Pulses:      Carotid pulses are on the right side with bruit and on the left side with bruit.      Femoral pulses are 1+ on the right side and 1+ on the left side.      Popliteal pulses are 1+ on the right side and 1+ on the left side.       Dorsalis pedis pulses are 2+ on the right side and 1+ on the left side.       Posterior tibial pulses are 0 on the right side and 1+ on the left side.  Pulses difficult to palpate due to bodily habitus.  Pulmonary/Chest: Effort normal and breath sounds normal. No accessory muscle usage. No respiratory distress.  Abdominal: Soft. Bowel sounds are normal.  Musculoskeletal:        General:  Normal range of motion.     Cervical back: Normal range of motion and neck supple.  Neurological: She is alert and oriented to person, place, and time.  Skin: Skin is warm and dry.  Psychiatric: She has a normal mood and affect.  Vitals reviewed.  Radiology: MR Knee Left w/o contrast  Result Date: 08/06/2019 CLINICAL DATA:  Medial left knee pain for the past 3 months. EXAM: MRI OF THE LEFT KNEE  WITHOUT CONTRAST TECHNIQUE: Multiplanar, multisequence MR imaging of the knee was performed. No intravenous contrast was administered. COMPARISON:  Left knee x-rays dated June 05, 2019. FINDINGS: MENISCI Medial meniscus:  Radial tear at the body/posterior horn junction. Lateral meniscus: Peripheral longitudinal tear of the posterior horn. LIGAMENTS Cruciates:  Intact ACL and PCL. Collaterals: Medial collateral ligament is intact. Old proximal MCL avulsion injury. Lateral collateral ligament complex is intact. CARTILAGE Patellofemoral:  Mild to moderate partial-thickness cartilage loss. Medial: Mild to moderate partial-thickness cartilage loss with a few scattered tiny full-thickness cartilage defects over the medial femoral condyle. Lateral:  Mild partial-thickness cartilage loss. Joint: Trace joint effusion. Normal Hoffa's fat. No plical thickening. Popliteal Fossa: Small loculated Baker cyst. Intact popliteus tendon. Extensor Mechanism: Intact quadriceps tendon and patellar tendon. Intact medial and lateral patellar retinaculum. Intact MPFL. Bones: No acute fracture or dislocation. No suspicious bone lesion. Tricompartmental marginal osteophytes. Other: None. IMPRESSION: 1. Radial tear at the medial meniscus body/posterior horn junction. 2. Peripheral longitudinal tear of the lateral meniscus posterior horn. 3. Tricompartmental osteoarthritis, mild-to-moderate in the medial and patellofemoral compartments. 4. Old proximal MCL avulsion injury. 5. Small loculated Baker cyst. Electronically Signed   By: Titus Dubin M.D.   On: 08/06/2019 11:09    Laboratory examination:    CMP Latest Ref Rng & Units 07/23/2019 07/04/2019 12/21/2018  Glucose 65 - 99 mg/dL 91 91 119(H)  BUN 8 - 27 mg/dL 17 17 13   Creatinine 0.57 - 1.00 mg/dL 0.91 0.85 0.96  Sodium 134 - 144 mmol/L 142 142 141  Potassium 3.5 - 5.2 mmol/L 4.3 4.0 3.0(L)  Chloride 96 - 106 mmol/L 100 107 104  CO2 20 - 29 mmol/L 24 27 27   Calcium 8.7 - 10.3 mg/dL 10.3 8.9 9.4  Total Protein 6.5 - 8.1 g/dL - - 7.2  Total Bilirubin 0.3 - 1.2 mg/dL - - 0.8  Alkaline Phos 38 - 126 U/L - - 71  AST 15 - 41 U/L - - 19  ALT 0 - 44 U/L - - 10   CBC Latest Ref Rng & Units 07/04/2019 12/21/2018 12/21/2018  WBC 4.0 - 10.5 K/uL 7.8 6.9 7.1  Hemoglobin 12.0 - 15.0 g/dL 9.5(L) 14.9 14.7  Hematocrit 36.0 - 46.0 % 34.3(L) 46.1(H) 45.8  Platelets 150 - 400 K/uL 542(H) 351 354   Lipid Panel     Component Value Date/Time   CHOL 166 05/12/2012 0237   TRIG 196 (H) 05/12/2012 0237   HDL 37 (L) 05/12/2012 0237   CHOLHDL 4.5 05/12/2012 0237   VLDL 39 05/12/2012 0237   LDLCALC 90 05/12/2012 0237   HEMOGLOBIN A1C Lab Results  Component Value Date   HGBA1C 5.6 12/26/2015   MPG 114 12/26/2015   TSH No results for input(s): TSH in the last 8760 hours.  PRN Meds:. Medications Discontinued During This Encounter  Medication Reason  . celecoxib (CELEBREX) 200 MG capsule Error  . hydrALAZINE (APRESOLINE) 50 MG tablet Dose change   Current Meds  Medication Sig  . albuterol (PROVENTIL HFA;VENTOLIN HFA) 108 (90 Base) MCG/ACT inhaler Inhale 1-2 puffs into the lungs every 6 (six) hours as needed for wheezing or shortness of breath.  Marland Kitchen amLODipine (NORVASC) 10 MG tablet   . Ascorbic Acid (VITAMIN C PO) Take 1 tablet by mouth daily.  Marland Kitchen aspirin EC 81 MG tablet Take 81 mg by mouth daily.   . ferrous sulfate 325 (65 FE) MG EC tablet Take 1 tablet by mouth daily.  . furosemide (LASIX) 20 MG  tablet Take 20 mg by mouth daily.  Marland Kitchen gabapentin (NEURONTIN) 600 MG tablet Take 1  tablet (600 mg total) by mouth at bedtime. (Patient taking differently: Take 600 mg by mouth at bedtime as needed (pain). )  . losartan-hydrochlorothiazide (HYZAAR) 100-25 MG tablet Take 1 tablet by mouth daily.  . meloxicam (MOBIC) 7.5 MG tablet Take 2 tablets (15 mg total) by mouth daily. (Patient taking differently: Take 7.5 mg by mouth daily. )  . metFORMIN (GLUCOPHAGE) 500 MG tablet Take 0.5 tablets (250 mg total) by mouth 2 (two) times daily with a meal.  . nitroGLYCERIN (NITROSTAT) 0.4 MG SL tablet Place 1 tablet (0.4 mg total) under the tongue every 5 (five) minutes as needed for chest pain.  . pantoprazole (PROTONIX) 40 MG tablet Take 40 mg by mouth daily.   . pravastatin (PRAVACHOL) 20 MG tablet Take 20 mg by mouth daily.  Marland Kitchen spironolactone (ALDACTONE) 50 MG tablet TAKE 1 TABLET(50 MG) BY MOUTH DAILY  . Vitamin D, Ergocalciferol, (DRISDOL) 1.25 MG (50000 UT) CAPS capsule Take 50,000 Units by mouth once a week.  . [DISCONTINUED] hydrALAZINE (APRESOLINE) 50 MG tablet Take 1 tablet (50 mg total) by mouth 3 (three) times daily.    Cardiac Studies:   Echocardiogram 02/18/2019: Left ventricle cavity is normal in size. Mild concentric hypertrophy of the left ventricle. Normal LV systolic function with EF 68%. Normal global wall motion. Doppler evidence of grade I (impaired) diastolic dysfunction, normal LAP. Calculated EF 68%. Trileaflet aortic valve with mild calcification. Mild aortic valve stenosis. Aortic valve mean gradient of 12 mmHg, Vmax of 2.4  m/s. Calculated aortic valve area by continuity equation is 1.4 cm. No regurgitation. Trace mitral regurgitation. Trace pulmonic regurgitation. Mild tricuspid regurgitation. Estimated pulmonary artery systolic pressure is 32 mmHg.  Exercise myoview stress 05/19/2017: 1. Patient achieved 4.4 METS and reached 83% target heart rate. Exercise capacity low for age. Appropriate heart rate and blood pressure response. Stress symptoms included  dyspnea, dizziness. Submaximal exercise stress study. 2. No stress EKG changes suggestive of ischemia. 3. Stress and rest SPECT images demonstrate homogeneous tracer distribution throughout the myocardium. Gated SPECT imaging reveals normal myocardial thickening and wall motion. The left ventricular ejection fraction was normal (65%).  4. This is a low risk submaximal exercise stress study   Peripheral arteriogram 02/11/2017: Successful PTA with DCB 5x150x2 InPact Admiral in the right proximal to distal SFA and stenting of proximal SFA with DES, [place] 6x100 mm stent. 3 vessel r/o. Left mild disease and 2 vessel R/O. Mild disease left by angiogram 10/05/2013. Lower extremity arterial duplex 04/15/2018: No hemodynamically significant stenoses are identified in the lower extremity arterial system. Mild diffuse calcific plaque noted. This exam reveals mildly decreased perfusion of the right lower extremity, noted at the anterior tibial artery level (ABI 0.86) and normal perfusion of the left lower extremity (ABI 0.97). No signficant change since 05/28/2017. Study suggests patent right SFA angioplasty site.  Lower extremity arterial duplex 04/15/2018: No hemodynamically significant stenoses are identified in the lower extremity arterial system. Mild diffuse calcific plaque noted. This exam reveals mildly decreased perfusion of the right lower extremity, noted at the anterior tibial artery level (ABI 0.86) and normal perfusion of the left lower extremity (ABI 0.97). No signficant change since 05/28/2017. Study suggests patent right SFA angioplasty site.  Carotid artery duplex 07/25/2015: Antegrade right vertebral artery flow. Antegrade left vertebral artery flow. No hemodynamically significant arterial disease in the internal carotid artery bilaterally. No significant change from 11/26/2012.  Coronary angiogram 02/27/2014: Mid RCA 60-70% stenosis, mid LAD 60-70% stenosis. FFR to the LAD, lesion  insignificant. Medical therapy for CAD. Moderate aortic valve calcification.  Assessment:     ICD-10-CM   1. Essential hypertension  I10   2. Chronic diastolic (congestive) heart failure (HCC)  I50.32   3. Tobacco use disorder  F17.200   4. Iron deficiency anemia, unspecified iron deficiency anemia type  D50.9    EKG 07/08/2019: Normal sinus rhythm at 66 bpm with 1 PAC and 1 PVC, normal axis, PRWP cannot exclude anterior infarct old. No evidence of ischemia.   Recommendations:  She is here for follow up on hypertension. Blood pressure has improved with increased dose of Aldactone. Kidney function remained stable. Her blood pressure does continue to be not at goal, will further increase her hydralazine up to 100 mg TID. Encouraged low sodium diet and try to exercise; however, her activity is somewhat limited at this time due to her left knee pain that she is currently being treated by Dr. Junius Roads.  No clinical evidence of decompensated heart failure. Unsure of the status of her anemia, will request records from PCP office. She may benefit from reevaluation from GI if her anemia has not improved. I will plan to see her back in 3 weeks for close follow up on hypertension.    Miquel Dunn, MSN, APRN, FNP-C [facility]. [place] Office: [phone] Fax: [phone]

## 2019-08-09 NOTE — Telephone Encounter (Signed)
I called and advised the patient of her results. She said she does not know how to get into MyChart. She is still having much pain with the knee. I scheduled her for the first available appointment with Dr. Marlou Sa, 08/16/19 at 3:15.

## 2019-08-16 ENCOUNTER — Ambulatory Visit (INDEPENDENT_AMBULATORY_CARE_PROVIDER_SITE_OTHER): Payer: Medicare HMO | Admitting: Orthopedic Surgery

## 2019-08-16 ENCOUNTER — Telehealth: Payer: Self-pay | Admitting: Radiology

## 2019-08-16 ENCOUNTER — Other Ambulatory Visit: Payer: Self-pay

## 2019-08-16 DIAGNOSIS — M1712 Unilateral primary osteoarthritis, left knee: Secondary | ICD-10-CM

## 2019-08-16 MED ORDER — TRAMADOL HCL 50 MG PO TABS: 50.0000 mg | ORAL_TABLET | Freq: Two times a day (BID) | ORAL | 0 refills | Status: DC | PRN

## 2019-08-16 NOTE — Telephone Encounter (Signed)
Please submit for authorization for gel injection left knee.  Marlou Sa

## 2019-08-17 ENCOUNTER — Encounter: Payer: Self-pay | Admitting: Orthopedic Surgery

## 2019-08-17 NOTE — Progress Notes (Signed)
Office Visit Note   Patient: [name]           Date of Birth: [birth year]           MRN: [id number] Visit Date: 08/16/2019 Requested by: Benito Mccreedy, MD [id number] [place],  [place] [address] PCP: Benito Mccreedy, MD  Subjective: Chief Complaint  Patient presents with  . Left Knee - Pain    HPI: Mechele Claude is a patient with left knee pain.  Since she was last seen by Dr. Junius Roads she has had an MRI scan which shows patellofemoral and medial compartment arthritis as well as meniscal root tear on the medial side and peripheral tear on the lateral side.  She reports pain for the past 2 months.  Unsure of exact injury but just knows she felt a pop.  States that she wakest from sleep with pain.  MRI scan also shows in addition arthritis in the medial compartment significant osteophyte formation and stable collateral and cruciate ligaments.  Denies much in the way of mechanical symptoms.  She did have an injection which did not give her much relief.              ROS: All systems reviewed are negative as they relate to the chief complaint within the history of present illness.  Patient denies  fevers or chills.   Assessment & Plan: Visit Diagnoses:  1. Arthritis of left knee     Plan: Impression is left knee pain with arthritis and meniscal pathology not totally unexpected in a patient who is 69 years old.  I think the pop she had 2 months ago was likely that radial tear of the meniscal root.  Her knee is subsequently undergoing significant progressive and rapid arthritic degeneration in that medial compartment.  She may need total knee replacement at sometime in the near future.  In the meantime I will try her with tramadol to help her sleep at night and then preapproved gel injection for the left knee.  See her back in 2 to 3 weeks when she is ready to get the gel shot.  I do not think she is getting a knee replacement anytime soon but she is heading in that  direction  This patient is diagnosed with osteoarthritis of the knee(s).    Radiographs show evidence of joint space narrowing, osteophytes, subchondral sclerosis and/or subchondral cysts.  This patient has knee pain which interferes with functional and activities of daily living.    This patient has experienced inadequate response, adverse effects and/or intolerance with conservative treatments such as acetaminophen, NSAIDS, topical creams, physical therapy or regular exercise, knee bracing and/or weight loss.   This patient has experienced inadequate response or has a contraindication to intra articular steroid injections for at least 3 months.   This patient is not scheduled to have a total knee replacement within 6 months of starting treatment with viscosupplementation.   Follow-Up Instructions: Return in about 3 weeks (around 09/06/2019).   Orders:  No orders of the defined types were placed in this encounter.  Meds ordered this encounter  Medications  . traMADol (ULTRAM) 50 MG tablet    Sig: Take 1 tablet (50 mg total) by mouth every 12 (twelve) hours as needed.    Dispense:  28 tablet    Refill:  0      Procedures: No procedures performed   Clinical Data: No additional findings.  Objective: Vital Signs: There were no vitals taken for this  visit.  Physical Exam:   Constitutional: Patient appears well-developed HEENT:  Head: Normocephalic Eyes:EOM are normal Neck: Normal range of motion Cardiovascular: Normal rate Pulmonary/chest: Effort normal Neurologic: Patient is alert Skin: Skin is warm Psychiatric: Patient has normal mood and affect    Ortho Exam: Ortho exam demonstrates palpable pedal pulses bilaterally.  She has no effusion in the right or left knee.  Left knee has intact extensor mechanism and flexion to about 95 degrees.  Medial greater than lateral joint line tenderness.  No groin pain with internal X rotation of the leg.  Extensor mechanism is  intact.  Specialty Comments:  No specialty comments available.  Imaging: No results found.   PMFS History: Patient Active Problem List   Diagnosis Date Noted  . Tobacco use disorder 09/18/2018  . Iron deficiency anemia 04/09/2018  . Symptomatic anemia 12/26/2015  . Hypokalemia 12/26/2015  . Type 2 diabetes mellitus with peripheral vascular disease ([place]) 12/26/2015  . Claudication in peripheral vascular disease ([place]) 10/05/2013  . Discomfort in chest 05/11/2012  . Nicotine dependence 05/11/2012  . Bradycardia 05/11/2012  . HTN (hypertension) 05/11/2012  . Back pain 05/11/2012   Past Medical History:  Diagnosis Date  . Anemia 12/26/2015  . Anxiety   . Arthritis   . Blood transfusion without reported diagnosis   . Chronic pain   . COPD (chronic obstructive pulmonary disease) ([place])   . Deep vein blood clot of right lower extremity ([place]) 12/29/2012   Dr. Nadyne Coombes, stent in place  . Depression   . Diabetes mellitus without complication ([place])   . GERD (gastroesophageal reflux disease)   . Heart murmur   . Hyperlipemia   . Hypertension   . Sleep apnea    uses a cpap  . Wears glasses     Family History  Problem Relation Age of Onset  . Diabetes Mother   . Hypertension Mother   . Heart disease Mother   . Heart disease Father   . Hypertension Father   . Hypertension Sister   . Hypertension Brother   . Diabetes Brother   . Hypertension Sister   . Hypertension Brother   . Diabetes Brother   . Hypertension Brother   . Hypertension Brother   . Hypertension Brother     Past Surgical History:  Procedure Laterality Date  . ABDOMINAL HYSTERECTOMY    . CARDIAC CATHETERIZATION  4/15  . CATARACT EXTRACTION    . CESAREAN SECTION    . COLONOSCOPY N/A 01/08/2013   Procedure: COLONOSCOPY;  Surgeon: Beryle Beams, MD;  Location: [facility] ENDOSCOPY;  Service: Endoscopy;  Laterality: N/A;  . COLONOSCOPY N/A 12/28/2015   Procedure: COLONOSCOPY;  Surgeon: Carol Ada, MD;  [facility]  ENDOSCOPY;  Service: Endoscopy;  Laterality: N/A;  . ENTEROSCOPY N/A 12/28/2015   Procedure: ENTEROSCOPY;  Surgeon: Carol Ada, MD;  Location: [facility];  Service: Endoscopy;  Laterality: N/A;  . ENTEROSCOPY N/A 02/20/2018   Procedure: ENTEROSCOPY;  Surgeon: Carol Ada, MD;  Location: [facility] ENDOSCOPY;  Service: Endoscopy;  Laterality: N/A;  . ENTEROSCOPY N/A 03/27/2018   Procedure: ENTEROSCOPY;  Surgeon: Carol Ada, MD;  Location: [facility] ENDOS[facility];  Service: Endoscopy;  Laterality: N/A;  . ESOPHAGOGASTRODUODENOSCOPY N/A 01/08/2013   Procedure: ESOPHAGOGASTRODUODENOSCOPY (EGD);  Surgeon: Beryle Beams, MD;  Location: [facility];  Service: Endoscopy;  Laterality: N/A;  . EYE SURGERY    . HAND SURGERY    . HOT HEMOSTASIS N/A 12/28/2015   Procedure: HOT HEMOSTASIS (ARGON PLASMA COAGULATION/BICAP);  Surgeon: Carol Ada,  MD;  Location: [facility];  Service: Endoscopy;  Laterality: N/A;  . HOT HEMOSTASIS N/A 02/20/2018   Procedure: HOT HEMOSTASIS (ARGON PLASMA COAGULATION/BICAP);  Surgeon: Carol Ada, MD;  Location: [facility];  Service: Endoscopy;  Laterality: N/A;  . HOT HEMOSTASIS N/A 03/27/2018   Procedure: HOT HEMOSTASIS (ARGON PLASMA COAGULATION/BICAP);  Surgeon: Carol Ada, MD;  Location: [facility];  Service: Endoscopy;  Laterality: N/A;  . LEFT HEART CATHETERIZATION WITH CORONARY ANGIOGRAM N/A 03/09/2013   Procedure: LEFT HEART CATHETERIZATION WITH CORONARY ANGIOGRAM;  Surgeon: Laverda Page, MD;  Location: [facility] CATH LAB;  Service: Cardiovascular;  Laterality: N/A;  . LOWER EXTREMITY ANGIOGRAM N/A 12/29/2012   Procedure: LOWER EXTREMITY ANGIOGRAM;  Surgeon: Laverda Page, MD;  Location: [facility] CATH LAB;  Service: Cardiovascular;  Laterality: N/A;  . LOWER EXTREMITY ANGIOGRAM N/A 10/05/2013   Procedure: LOWER EXTREMITY ANGIOGRAM;  Surgeon: Laverda Page, MD;  Location: [facility] CATH LAB;  Service: Cardiovascular;  Laterality: N/A;  . LOWER EXTREMITY ANGIOGRAPHY N/A 02/11/2017    Procedure: Lower Extremity Angiography;  Surgeon: Adrian Prows, MD;  Location: [facility] CV LAB;  Service: Cardiovascular;  Laterality: N/A;  . PERIPHERAL VASCULAR BALLOON ANGIOPLASTY  02/11/2017   Procedure: PERIPHERAL VASCULAR BALLOON ANGIOPLASTY;  Surgeon: Adrian Prows, MD;  Location: [place] CV LAB;  Service: Cardiovascular;;  Right SFA  . PERIPHERAL VASCULAR INTERVENTION Right 02/11/2017   Procedure: PERIPHERAL VASCULAR INTERVENTION;  Surgeon: Adrian Prows, MD;  Location: [place] [facility] CV LAB;  Service: Cardiovascular;  Laterality: Right;  Rt SFA  . stent in right leg  12/29/2012   for blood clot, Dr. Nadyne Coombes  . TYMPANOMASTOIDECTOMY Left 03/08/2014   Procedure: TYMPANOMASTOIDECTOMY LEFT;  Surgeon: Ascencion Dike, MD;  Location: [facility];  Service: ENT;  Laterality: Left;  . TYMPANOMASTOIDECTOMY  2015  . UTERINE FIBROID SURGERY     Social History   Occupational History  . Not on file  Tobacco Use  . Smoking status: Current Every Day Smoker    Packs/day: 0.50    Years: 41.00    Pack years: 20.50    Types: Cigarettes  . Smokeless tobacco: Never Used  Substance and Sexual Activity  . Alcohol use: Yes    Comment: rarely   . Drug use: Yes    Types: Marijuana    Comment: last-02/24/14  . Sexual activity: Not on file

## 2019-08-17 NOTE — Telephone Encounter (Signed)
Submitted for VOB for Monovisc-left knee

## 2019-08-18 ENCOUNTER — Telehealth: Payer: Self-pay

## 2019-08-18 NOTE — Telephone Encounter (Signed)
Called and scheduled pt for 08/25/19 @ 3:15

## 2019-08-18 NOTE — Telephone Encounter (Signed)
Approved for Monovisc-Left knee Dr. Latanya Presser and Bill Covered 100% after OOP has been met No Copay Prior auth required with [employer] auth # [id number] Dates 08/18/19-02/15/20

## 2019-08-25 ENCOUNTER — Ambulatory Visit (INDEPENDENT_AMBULATORY_CARE_PROVIDER_SITE_OTHER): Payer: Medicare HMO | Admitting: Orthopedic Surgery

## 2019-08-25 ENCOUNTER — Other Ambulatory Visit: Payer: Self-pay

## 2019-08-25 DIAGNOSIS — M1712 Unilateral primary osteoarthritis, left knee: Secondary | ICD-10-CM

## 2019-08-26 ENCOUNTER — Encounter: Payer: Self-pay | Admitting: Orthopedic Surgery

## 2019-08-26 DIAGNOSIS — M1712 Unilateral primary osteoarthritis, left knee: Secondary | ICD-10-CM

## 2019-08-26 MED ORDER — HYALURONAN 88 MG/4ML IX SOSY
88.0000 mg | PREFILLED_SYRINGE | INTRA_ARTICULAR | Status: AC | PRN
  Administered 2019-08-26: 88 mg via INTRA_ARTICULAR

## 2019-08-26 MED ORDER — LIDOCAINE HCL 1 % IJ SOLN
5.0000 mL | INTRAMUSCULAR | Status: AC | PRN
  Administered 2019-08-26: 19:00:00 5 mL

## 2019-08-26 NOTE — Progress Notes (Signed)
   Procedure Note  Patient: [name]             Date of Birth: 12-24-1950           MRN: [id number]             Visit Date: 08/25/2019  Procedures: Visit Diagnoses:  1. Arthritis of left knee     Large Joint Inj: L knee on 08/26/2019 6:49 PM Indications: pain, joint swelling and diagnostic evaluation Details: 18 G 1.5 in needle, superolateral approach  Arthrogram: No  Medications: 5 mL lidocaine 1 %; 88 mg Hyaluronan 88 MG/4ML Outcome: tolerated well, no immediate complications Procedure, treatment alternatives, risks and benefits explained, specific risks discussed. Consent was given by the patient. Immediately prior to procedure a time out was called to verify the correct patient, procedure, equipment, support staff and site/side marked as required. Patient was prepped and draped in the usual sterile fashion.

## 2019-08-27 ENCOUNTER — Ambulatory Visit: Payer: Medicare HMO | Admitting: Cardiology

## 2019-08-27 NOTE — Progress Notes (Deleted)
Primary Physician:  Benito Mccreedy, MD   Patient ID: [name], female    DOB: [birth year], 69 y.o.   MRN: [id number]  Subjective:    No chief complaint on file.   HPI: [name]  is a 69 y.o. female  with hypertension, hyperlipidemia, moderate CAD being treated medically and known peripheral arterial disease s/p PV angiogram with revascularization of SFA and has not had reoccurence of claudication, history of prior GI bleed in May 2014 and again in July 4th 2017, Upper GI and lower GI evaluation revealed multiple non-bleeding AVMs in the jejunum and colonic polyps and diverticular disease - Dr. Benson Norway. She has not had any further recurrence of GI bleed. She underwent exercise nuclear stress test in which her excess capacity was low on 05/19/2017 and is no evidence of ischemia and she achieved only 83% of MPHR. Also underwent LE arterial duplex on 04/15/2018, revealing right ABI has improved from 0.5 to 0.86, suggesting patent right SFA angioplasty site.  She was recently seen for acute visit for uncontrolled hypertension after being seen in ER. Aldactone was increased to 50 mg daily. She now presents for follow up.   She has recently been found to be significantly anemic; however, hemoccult was negative. She has been started on iron supplements. She has had follow up with her PCP, but is unsure if her anemia is improving. She has not had any bleeding.   She is now following Dr. Junius Roads for injury to her left knee, and underwent cortisone injection in Dec 2020. She has underwent MRI and is still having issues with her knee.  She denies any chest pain or worsening dyspnea on exertion.   Urinary incontinence is unchanged. Unfortunately, she continues to smoke 1/2 pack of cigarettes daily. She was able to get her dog back and has since been walking more.  Past Medical History:  Diagnosis Date  . Anemia 12/26/2015  . Anxiety   . Arthritis   . Blood transfusion  without reported diagnosis   . Chronic pain   . COPD (chronic obstructive pulmonary disease) ([place])   . Deep vein blood clot of right lower extremity ([place]) 12/29/2012   Dr. Nadyne Coombes, stent in place  . Depression   . Diabetes mellitus without complication ([place])   . GERD (gastroesophageal reflux disease)   . Heart murmur   . Hyperlipemia   . Hypertension   . Sleep apnea    uses a cpap  . Wears glasses     Past Surgical History:  Procedure Laterality Date  . ABDOMINAL HYSTERECTOMY    . CARDIAC CATHETERIZATION  4/15  . CATARACT EXTRACTION    . CESAREAN SECTION    . COLONOSCOPY N/A 01/08/2013   Procedure: COLONOSCOPY;  Surgeon: Beryle Beams, MD;  Location: [facility] ENDOSCOPY;  Service: Endoscopy;  Laterality: N/A;  . COLONOSCOPY N/A 12/28/2015   Procedure: COLONOSCOPY;  Surgeon: Carol Ada, MD;  Location: [facility] ENDOSCOPY;  Service: Endoscopy;  Laterality: N/A;  . ENTEROSCOPY N/A 12/28/2015   Procedure: ENTEROSCOPY;  Surgeon: Carol Ada, MD;  Location: [place];  Service: Endoscopy;  Laterality: N/A;  . ENTEROSCOPY N/A 02/20/2018   Procedure: ENTEROSCOPY;  Surgeon: Carol Ada, MD;  Location: [facility] ENDOSCOPY;  Service: Endoscopy;  Laterality: N/A;  . ENTEROSCOPY N/A 03/27/2018   Procedure: ENTEROSCOPY;  Surgeon: Carol Ada, MD;  Location: [facility] ENDOSCOPY;  Service: Endoscopy;  Laterality: N/A;  . ESOPHAGOGASTRODUODENOSCOPY N/A 01/08/2013   Procedure: ESOPHAGOGASTRODUODENOSCOPY (EGD);  Surgeon: Beryle Beams, MD;  Location:  [facility] ENDOSCOPY;  Service: Endoscopy;  Laterality: N/A;  . EYE SURGERY    . HAND SURGERY    . HOT HEMOSTASIS N/A 12/28/2015   Procedure: HOT HEMOSTASIS (ARGON PLASMA COAGULATION/BICAP);  Surgeon: Carol Ada, MD;  Location: [facility] ENDOSCOPY;  Service: Endoscopy;  Laterality: N/A;  . HOT HEMOSTASIS N/A 02/20/2018   Procedure: HOT HEMOSTASIS (ARGON PLASMA COAGULATION/BICAP);  Surgeon: Carol Ada, MD;  Location: [facility] ENDOSCOPY;  Service: Endoscopy;  Laterality: N/A;  . HOT HEMOSTASIS N/A  03/27/2018   Procedure: HOT HEMOSTASIS (ARGON PLASMA COAGULATION/BICAP);  Surgeon: Carol Ada, MD;  Location: [facility] ENDOSCOPY;  Service: Endoscopy;  Laterality: N/A;  . LEFT HEART CATHETERIZATION WITH CORONARY ANGIOGRAM N/A 03/09/2013   Procedure: LEFT HEART CATHETERIZATION WITH CORONARY ANGIOGRAM;  Surgeon: Laverda Page, MD;  Location: [facility] CATH LAB;  Service: Cardiovascular;  Laterality: N/A;  . LOWER EXTREMITY ANGIOGRAM N/A 12/29/2012   Procedure: LOWER EXTREMITY ANGIOGRAM;  Surgeon: Laverda Page, MD;  Location: [facility] CATH LAB;  Service: Cardiovascular;  Laterality: N/A;  . LOWER EXTREMITY ANGIOGRAM N/A 10/05/2013   Procedure: LOWER EXTREMITY ANGIOGRAM;  Surgeon: Laverda Page, MD;  Location: [facility] CATH LAB;  Service: Cardiovascular;  Laterality: N/A;  . LOWER EXTREMITY ANGIOGRAPHY N/A 02/11/2017   Procedure: Lower Extremity Angiography;  Surgeon: Adrian Prows, MD;  Location: [place] CV LAB;  Service: Cardiovascular;  Laterality: N/A;  . PERIPHERAL VASCULAR BALLOON ANGIOPLASTY  02/11/2017   Procedure: PERIPHERAL VASCULAR BALLOON ANGIOPLASTY;  Surgeon: Adrian Prows, MD;  Location: [place] CV LAB;  Service: Cardiovascular;;  Right SFA  . PERIPHERAL VASCULAR INTERVENTION Right 02/11/2017   Procedure: PERIPHERAL VASCULAR INTERVENTION;  Surgeon: Adrian Prows, MD;  Location: [place] CV LAB;  Service: Cardiovascular;  Laterality: Right;  Rt SFA  . stent in right leg  12/29/2012   for blood clot, Dr. Nadyne Coombes  . TYMPANOMASTOIDECTOMY Left 03/08/2014   Procedure: TYMPANOMASTOIDECTOMY LEFT;  Surgeon: Ascencion Dike, MD;  Location: [place];  Service: ENT;  Laterality: Left;  . TYMPANOMASTOIDECTOMY  2015  . UTERINE FIBROID SURGERY      Social History   Socioeconomic History  . Marital status: Divorced    Spouse name: Not on file  . Number of children: 3  . Years of education: Not on file  . Highest education level: Not on file  Occupational History  . Not on file  Tobacco Use  .  Smoking status: Current Every Day Smoker    Packs/day: 0.50    Years: 41.00    Pack years: 20.50    Types: Cigarettes  . Smokeless tobacco: Never Used  Substance and Sexual Activity  . Alcohol use: Yes    Comment: rarely   . Drug use: Yes    Types: Marijuana    Comment: last-02/24/14  . Sexual activity: Not on file  Other Topics Concern  . Not on file  Social History Narrative  . Not on file   Social Determinants of Health   Financial Resource Strain:   . Difficulty of Paying Living Expenses: Not on file  Food Insecurity:   . Worried About Charity fundraiser in the Last Year: Not on file  . Ran Out of Food in the Last Year: Not on file  Transportation Needs:   . Lack of Transportation (Medical): Not on file  . Lack of Transportation (Non-Medical): Not on file  Physical Activity:   . Days of Exercise per Week: Not on file  . Minutes of Exercise per Session: Not on  file  Stress:   . Feeling of Stress : Not on file  Social Connections:   . Frequency of Communication with Friends and Family: Not on file  . Frequency of Social Gatherings with Friends and Family: Not on file  . Attends Religious Services: Not on file  . Active Member of Clubs or Organizations: Not on file  . Attends Archivist Meetings: Not on file  . Marital Status: Not on file  Intimate Partner Violence:   . Fear of Current or Ex-Partner: Not on file  . Emotionally Abused: Not on file  . Physically Abused: Not on file  . Sexually Abused: Not on file    Review of Systems  Constitution: Negative for decreased appetite, malaise/fatigue, weight gain and weight loss.  Eyes: Negative for visual disturbance.  Cardiovascular: Positive for dyspnea on exertion (chronic, stable). Negative for chest pain, claudication, leg swelling, orthopnea, palpitations and syncope.  Respiratory: Negative for hemoptysis and wheezing.   Endocrine: Negative for cold intolerance and heat intolerance.    Hematologic/Lymphatic: Does not bruise/bleed easily.  Skin: Negative for nail changes.  Musculoskeletal: Negative for muscle weakness and myalgias.  Gastrointestinal: Negative for abdominal pain, change in bowel habit, nausea and vomiting.  Genitourinary: Positive for bladder incontinence.  Neurological: Positive for sensory change (occasional tingling sensation in her right thigh). Negative for difficulty with concentration, dizziness, focal weakness and headaches.  Psychiatric/Behavioral: Negative for altered mental status and suicidal ideas.  All other systems reviewed and are negative.     Objective:  There were no vitals taken for this visit. There is no height or weight on file to calculate BMI.    Physical Exam  Constitutional: She is oriented to person, place, and time. Vital signs are normal. She appears well-developed and well-nourished.  HENT:  Head: Normocephalic and atraumatic.  Neck: No tracheal deviation present. No thyromegaly present.  Cardiovascular: Normal rate, regular rhythm and intact distal pulses.  Murmur heard.  Blowing early systolic murmur is present with a grade of 2/6 at the upper right sternal border radiating to the neck. Pulses:      Carotid pulses are on the right side with bruit and on the left side with bruit.      Femoral pulses are 1+ on the right side and 1+ on the left side.      Popliteal pulses are 1+ on the right side and 1+ on the left side.       Dorsalis pedis pulses are 2+ on the right side and 1+ on the left side.       Posterior tibial pulses are 0 on the right side and 1+ on the left side.  Pulses difficult to palpate due to bodily habitus.  Pulmonary/Chest: Effort normal and breath sounds normal. No accessory muscle usage. No respiratory distress.  Abdominal: Soft. Bowel sounds are normal.  Musculoskeletal:        General: Normal range of motion.     Cervical back: Normal range of motion and neck supple.  Neurological: She is alert  and oriented to person, place, and time.  Skin: Skin is warm and dry.  Psychiatric: She has a normal mood and affect.  Vitals reviewed.  Radiology: No results found.  Laboratory examination:    CMP Latest Ref Rng & Units 07/23/2019 07/04/2019 12/21/2018  Glucose 65 - 99 mg/dL 91 91 119(H)  BUN 8 - 27 mg/dL 17 17 13   Creatinine 0.57 - 1.00 mg/dL 0.91 0.85 0.96  Sodium 134 -  144 mmol/L 142 142 141  Potassium 3.5 - 5.2 mmol/L 4.3 4.0 3.0(L)  Chloride 96 - 106 mmol/L 100 107 104  CO2 20 - 29 mmol/L 24 27 27   Calcium 8.7 - 10.3 mg/dL 10.3 8.9 9.4  Total Protein 6.5 - 8.1 g/dL - - 7.2  Total Bilirubin 0.3 - 1.2 mg/dL - - 0.8  Alkaline Phos 38 - 126 U/L - - 71  AST 15 - 41 U/L - - 19  ALT 0 - 44 U/L - - 10   CBC Latest Ref Rng & Units 07/04/2019 12/21/2018 12/21/2018  WBC 4.0 - 10.5 K/uL 7.8 6.9 7.1  Hemoglobin 12.0 - 15.0 g/dL 9.5(L) 14.9 14.7  Hematocrit 36.0 - 46.0 % 34.3(L) 46.1(H) 45.8  Platelets 150 - 400 K/uL 542(H) 351 354   Lipid Panel     Component Value Date/Time   CHOL 166 05/12/2012 0237   TRIG 196 (H) 05/12/2012 0237   HDL 37 (L) 05/12/2012 0237   CHOLHDL 4.5 05/12/2012 0237   VLDL 39 05/12/2012 0237   LDLCALC 90 05/12/2012 0237   HEMOGLOBIN A1C Lab Results  Component Value Date   HGBA1C 5.6 12/26/2015   MPG 114 12/26/2015   TSH No results for input(s): TSH in the last 8760 hours.  PRN Meds:. There are no discontinued medications. No outpatient medications have been marked as taking for the 08/27/19 encounter (Appointment) with Miquel Dunn, NP.    Cardiac Studies:   Echocardiogram 02/18/2019: Left ventricle cavity is normal in size. Mild concentric hypertrophy of the left ventricle. Normal LV systolic function with EF 68%. Normal global wall motion. Doppler evidence of grade I (impaired) diastolic dysfunction, normal LAP. Calculated EF 68%. Trileaflet aortic valve with mild calcification. Mild aortic valve stenosis. Aortic valve mean gradient of 12  mmHg, Vmax of 2.4  m/s. Calculated aortic valve area by continuity equation is 1.4 cm. No regurgitation. Trace mitral regurgitation. Trace pulmonic regurgitation. Mild tricuspid regurgitation. Estimated pulmonary artery systolic pressure is 32 mmHg.  Exercise myoview stress 05/19/2017: 1. Patient achieved 4.4 METS and reached 83% target heart rate. Exercise capacity low for age. Appropriate heart rate and blood pressure response. Stress symptoms included dyspnea, dizziness. Submaximal exercise stress study. 2. No stress EKG changes suggestive of ischemia. 3. Stress and rest SPECT images demonstrate homogeneous tracer distribution throughout the myocardium. Gated SPECT imaging reveals normal myocardial thickening and wall motion. The left ventricular ejection fraction was normal (65%).  4. This is a low risk submaximal exercise stress study   Peripheral arteriogram 02/11/2017: Successful PTA with DCB 5x150x2 InPact Admiral in the right proximal to distal SFA and stenting of proximal SFA with DES, [place] 6x100 mm stent. 3 vessel r/o. Left mild disease and 2 vessel R/O. Mild disease left by angiogram 10/05/2013. Lower extremity arterial duplex 04/15/2018: No hemodynamically significant stenoses are identified in the lower extremity arterial system. Mild diffuse calcific plaque noted. This exam reveals mildly decreased perfusion of the right lower extremity, noted at the anterior tibial artery level (ABI 0.86) and normal perfusion of the left lower extremity (ABI 0.97). No signficant change since 05/28/2017. Study suggests patent right SFA angioplasty site.  Lower extremity arterial duplex 04/15/2018: No hemodynamically significant stenoses are identified in the lower extremity arterial system. Mild diffuse calcific plaque noted. This exam reveals mildly decreased perfusion of the right lower extremity, noted at the anterior tibial artery level (ABI 0.86) and normal perfusion of the left lower  extremity (ABI 0.97). No signficant change since 05/28/2017. Study  suggests patent right SFA angioplasty site.  Carotid artery duplex 07/25/2015: Antegrade right vertebral artery flow. Antegrade left vertebral artery flow. No hemodynamically significant arterial disease in the internal carotid artery bilaterally. No significant change from 11/26/2012.  Coronary angiogram 02/27/2014: Mid RCA 60-70% stenosis, mid LAD 60-70% stenosis. FFR to the LAD, lesion insignificant. Medical therapy for CAD. Moderate aortic valve calcification.  Assessment:   No diagnosis found. EKG 07/08/2019: Normal sinus rhythm at 66 bpm with 1 PAC and 1 PVC, normal axis, PRWP cannot exclude anterior infarct old. No evidence of ischemia.   Recommendations:  She is here for follow up on hypertension. Blood pressure has improved with increased dose of Aldactone. Kidney function remained stable. Her blood pressure does continue to be not at goal, will further increase her hydralazine up to 100 mg TID. Encouraged low sodium diet and try to exercise; however, her activity is somewhat limited at this time due to her left knee pain that she is currently being treated by Dr. Junius Roads.  No clinical evidence of decompensated heart failure. Unsure of the status of her anemia, will request records from PCP office. She may benefit from reevaluation from GI if her anemia has not improved. I will plan to see her back in 3 weeks for close follow up on hypertension.    Miquel Dunn, MSN, APRN, FNP-C [facility]. [place] Office: [phone] Fax: [phone]

## 2019-08-30 ENCOUNTER — Encounter: Payer: Self-pay | Admitting: Cardiology

## 2019-08-30 ENCOUNTER — Other Ambulatory Visit: Payer: Self-pay

## 2019-08-30 ENCOUNTER — Other Ambulatory Visit: Payer: Self-pay | Admitting: Cardiology

## 2019-08-30 ENCOUNTER — Ambulatory Visit: Payer: Medicare HMO | Admitting: Cardiology

## 2019-08-30 VITALS — BP 136/67 | HR 62 | Temp 98.7°F | Ht 60.0 in | Wt 219.0 lb

## 2019-08-30 DIAGNOSIS — E1151 Type 2 diabetes mellitus with diabetic peripheral angiopathy without gangrene: Secondary | ICD-10-CM

## 2019-08-30 DIAGNOSIS — D509 Iron deficiency anemia, unspecified: Secondary | ICD-10-CM

## 2019-08-30 DIAGNOSIS — I10 Essential (primary) hypertension: Secondary | ICD-10-CM

## 2019-08-30 DIAGNOSIS — F17200 Nicotine dependence, unspecified, uncomplicated: Secondary | ICD-10-CM | POA: Diagnosis not present

## 2019-08-30 NOTE — Telephone Encounter (Signed)
Pt has appt today

## 2019-08-30 NOTE — Telephone Encounter (Signed)
Please have the patient complete the labs ordered today prior to refill. Need to check kidney function and electrolyte.

## 2019-08-30 NOTE — Patient Instructions (Signed)
Please remember to bring in your medication bottles in at the next visit.   New Medications that were added at today's visit:  None  Medications that were discontinued at today's visit: None  Office will call you to have the following tests scheduled:  BMP and Mg ordered. She can get these done with her PCP tomorrow when she has her physical.   Recommend follow up with your PCP as scheduled.

## 2019-08-30 NOTE — Progress Notes (Signed)
Primary Physician:  Benito Mccreedy, MD   Patient ID: [name], female    DOB: [birth year], 69 y.o.   MRN: [id number]  Subjective:    Chief Complaint  Patient presents with  . Hypertension    Follow up    HPI: [name]  is a 69 y.o. female  with hypertension, hyperlipidemia,, active smoker, moderate CAD being treated medically and known peripheral arterial disease s/p PV angiogram with revascularization of SFA and has not had reoccurence of claudication, history of prior GI bleed in May 2014 and again in July 4th 2017 who presents to the office with a chief complaint of hypertension follow-up.   Hypertension follow-up: Patient states that her blood pressures have been better controlled at home.  Systolic blood pressures range between 120-130 mmHg.  Patient started hydralazine as prescribed at last office visit she is tolerating the medication well without any side effects or intolerances.  Patient states that her blood pressure when she checked it earlier this morning prior to the office visit was 129/69 with a pulse of 65 bpm.  Patient is compliant with her current antihypertensive medications.  She tries to follow a low-salt diet.  Her physical activity is limited due to arthritis in the knees and a degree of neuropathy.  Discussed having labs done due to the recent medication changes.  However, patient states that she is seeing her primary care provider tomorrow who will be performing labs.  I recommend a BMP, and magnesium level.  He is also asked to follow-up with GI given her underlying anemia and most recent hemoglobin levels.  Patient verbalized understanding of these recommendations.  She underwent exercise nuclear stress test in which her excess capacity was low on 05/19/2017 and is no evidence of ischemia and she achieved only 83% of MPHR.  Also underwent LE arterial duplex on 04/15/2018, revealing right ABI has improved from 0.5 to 0.86, suggesting  patent right SFA angioplasty site.  She denies any chest pain or worsening dyspnea on exertion.   Urinary incontinence is unchanged. Unfortunately, she continues to smoke 1/2 pack of cigarettes daily. She was able to get her dog back and has since been walking more.  Past Medical History:  Diagnosis Date  . Anemia 12/26/2015  . Anxiety   . Arthritis   . Blood transfusion without reported diagnosis   . Chronic pain   . COPD (chronic obstructive pulmonary disease) ([place])   . Deep vein blood clot of right lower extremity ([place]) 12/29/2012   Dr. Nadyne Coombes, stent in place  . Depression   . Diabetes mellitus without complication ([place])   . GERD (gastroesophageal reflux disease)   . Heart murmur   . Hyperlipemia   . Hypertension   . Sleep apnea    uses a cpap  . Wears glasses     Past Surgical History:  Procedure Laterality Date  . ABDOMINAL HYSTERECTOMY    . CARDIAC CATHETERIZATION  4/15  . CATARACT EXTRACTION    . CESAREAN SECTION    . COLONOSCOPY N/A 01/08/2013   Procedure: COLONOSCOPY;  Surgeon: Beryle Beams, MD;  Location: [facility] ENDOSCOPY;  Service: Endoscopy;  Laterality: N/A;  . COLONOSCOPY N/A 12/28/2015   Procedure: COLONOSCOPY;  Surgeon: Carol Ada, MD;  Location[facility] ENDOSCOPY;  Service: Endoscopy;  Laterality: N/A;  . ENTEROSCOPY N/A 12/28/2015   Procedure: ENTEROSCOPY;  Surgeon: Carol Ada, MD;  Location: [place];  Service: Endoscopy;  Laterality: N/A;  . ENTEROSCOPY N/A 02/20/2018   Procedure: ENTEROSCOPY;  Surgeon: Carol Ada, MD;  Location: [facility] ENDOSCOPY;  Service: Endoscopy;  Laterality: N/A;  . ENTEROSCOPY N/A 03/27/2018   Procedure: ENTEROSCOPY;  Surgeon: Carol Ada, MD;  Location: [facility] ENDOSCOPY;  Service: Endoscopy;  Laterality: N/A;  . ESOPHAGOGASTRODUODENOSCOPY N/A 01/08/2013   Procedure: ESOPHAGOGASTRODUODENOSCOPY (EGD);  Surgeon: Beryle Beams, MD;  Location: [facility] ENDOSCOPY;  Service: Endoscopy;  Laterality: N/A;  . EYE SURGERY    . HAND SURGERY    . HOT  HEMOSTASIS N/A 12/28/2015   Procedure: HOT HEMOSTASIS (ARGON PLASMA COAGULATION/BICAP);  Surgeon: Carol Ada, MD;  Location: [facility] ENDOSCOPY;  Service: Endoscopy;  Laterality: N/A;  . HOT HEMOSTASIS N/A 02/20/2018   Procedure: HOT HEMOSTASIS (ARGON PLASMA COAGULATION/BICAP);  Surgeon: Carol Ada, MD;  Location: [facility] ENDOSCOPY;  Service: Endoscopy;  Laterality: N/A;  . HOT HEMOSTASIS N/A 03/27/2018   Procedure: HOT HEMOSTASIS (ARGON PLASMA COAGULATION/BICAP);  Surgeon: Carol Ada, MD;  Location: [facility] ENDOSCOPY;  Service: Endoscopy;  Laterality: N/A;  . LEFT HEART CATHETERIZATION WITH CORONARY ANGIOGRAM N/A 03/09/2013   Procedure: LEFT HEART CATHETERIZATION WITH CORONARY ANGIOGRAM;  Surgeon: Laverda Page, MD;  Location: [facility] CATH LAB;  Service: Cardiovascular;  Laterality: N/A;  . LOWER EXTREMITY ANGIOGRAM N/A 12/29/2012   Procedure: LOWER EXTREMITY ANGIOGRAM;  Surgeon: Laverda Page, MD;  Location: [facility] CATH LAB;  Service: Cardiovascular;  Laterality: N/A;  . LOWER EXTREMITY ANGIOGRAM N/A 10/05/2013   Procedure: LOWER EXTREMITY ANGIOGRAM;  Surgeon: Laverda Page, MD;  Location: [facility] CATH LAB;  Service: Cardiovascular;  Laterality: N/A;  . LOWER EXTREMITY ANGIOGRAPHY N/A 02/11/2017   Procedure: Lower Extremity Angiography;  Surgeon: Adrian Prows, MD;  Location: [place] CV LAB;  Service: Cardiovascular;  Laterality: N/A;  . PERIPHERAL VASCULAR BALLOON ANGIOPLASTY  02/11/2017   Procedure: PERIPHERAL VASCULAR BALLOON ANGIOPLASTY;  Surgeon: Adrian Prows, MD;  Location: [place] CV LAB;  Service: Cardiovascular;;  Right SFA  . PERIPHERAL VASCULAR INTERVENTION Right 02/11/2017   Procedure: PERIPHERAL VASCULAR INTERVENTION;  Surgeon: Adrian Prows, MD;  Location: [place] CV LAB;  Service: Cardiovascular;  Laterality: Right;  Rt SFA  . stent in right leg  12/29/2012   for blood clot, Dr. Nadyne Coombes  . TYMPANOMASTOIDECTOMY Left 03/08/2014   Procedure: TYMPANOMASTOIDECTOMY LEFT;  Surgeon: Ascencion Dike, MD;   Location: [place];  Service: ENT;  Laterality: Left;  . TYMPANOMASTOIDECTOMY  2015  . UTERINE FIBROID SURGERY      Social History   Socioeconomic History  . Marital status: Divorced    Spouse name: Not on file  . Number of children: 3  . Years of education: Not on file  . Highest education level: Not on file  Occupational History  . Not on file  Tobacco Use  . Smoking status: Current Every Day Smoker    Packs/day: 0.50    Years: 41.00    Pack years: 20.50    Types: Cigarettes  . Smokeless tobacco: Never Used  Substance and Sexual Activity  . Alcohol use: Yes    Comment: rarely   . Drug use: Yes    Types: Marijuana    Comment: last-02/24/14  . Sexual activity: Not on file  Other Topics Concern  . Not on file  Social History Narrative  . Not on file   Social Determinants of Health   Financial Resource Strain:   . Difficulty of Paying Living Expenses: Not on file  Food Insecurity:   . Worried About Charity fundraiser in the Last Year: Not on file  .  Ran Out of Food in the Last Year: Not on file  Transportation Needs:   . Lack of Transportation (Medical): Not on file  . Lack of Transportation (Non-Medical): Not on file  Physical Activity:   . Days of Exercise per Week: Not on file  . Minutes of Exercise per Session: Not on file  Stress:   . Feeling of Stress : Not on file  Social Connections:   . Frequency of Communication with Friends and Family: Not on file  . Frequency of Social Gatherings with Friends and Family: Not on file  . Attends Religious Services: Not on file  . Active Member of Clubs or Organizations: Not on file  . Attends Archivist Meetings: Not on file  . Marital Status: Not on file  Intimate Partner Violence:   . Fear of Current or Ex-Partner: Not on file  . Emotionally Abused: Not on file  . Physically Abused: Not on file  . Sexually Abused: Not on file    Review of Systems  Constitution: Negative for decreased  appetite, malaise/fatigue, weight gain and weight loss.  Eyes: Negative for visual disturbance.  Cardiovascular: Positive for dyspnea on exertion (chronic, stable). Negative for chest pain, claudication, leg swelling, orthopnea, palpitations and syncope.  Respiratory: Negative for hemoptysis and wheezing.   Endocrine: Negative for cold intolerance and heat intolerance.  Hematologic/Lymphatic: Does not bruise/bleed easily.  Skin: Negative for nail changes.  Musculoskeletal: Negative for muscle weakness and myalgias.  Gastrointestinal: Negative for abdominal pain, change in bowel habit, nausea and vomiting.  Genitourinary: Positive for bladder incontinence.  Neurological: Positive for sensory change (occasional tingling sensation in her right thigh). Negative for difficulty with concentration, dizziness, focal weakness and headaches.  Psychiatric/Behavioral: Negative for altered mental status and suicidal ideas.  All other systems reviewed and are negative.     Objective:  Blood pressure 136/67, pulse 62, temperature 98.7 F (37.1 C), temperature source Temporal, height 5' (1.524 m), weight 219 lb (99.3 kg). Body mass index is 42.77 kg/m.    Physical Exam  Constitutional: She is oriented to person, place, and time. Vital signs are normal. She appears well-developed and well-nourished.  HENT:  Head: Normocephalic and atraumatic.  Neck: No tracheal deviation present. No thyromegaly present.  Cardiovascular: Normal rate, regular rhythm and intact distal pulses.  Pulses:      Carotid pulses are on the right side with bruit and on the left side with bruit.      Femoral pulses are 1+ on the right side and 1+ on the left side.      Popliteal pulses are 1+ on the right side and 1+ on the left side.       Dorsalis pedis pulses are 2+ on the right side and 1+ on the left side.       Posterior tibial pulses are 0 on the right side and 1+ on the left side.  Pulses difficult to palpate due to bodily  habitus. Soft systolic ejection murmur heard at the second right intercostal space.  Pulmonary/Chest: Effort normal and breath sounds normal. No accessory muscle usage. No respiratory distress.  Abdominal: Soft. Bowel sounds are normal.  Musculoskeletal:        General: Normal range of motion.     Cervical back: Normal range of motion and neck supple.  Neurological: She is alert and oriented to person, place, and time.  Skin: Skin is warm and dry.  Psychiatric: She has a normal mood and affect.  Vitals reviewed.  Radiology: No results found.  Laboratory examination:    CMP Latest Ref Rng & Units 07/23/2019 07/04/2019 12/21/2018  Glucose 65 - 99 mg/dL 91 91 119(H)  BUN 8 - 27 mg/dL 17 17 13   Creatinine 0.57 - 1.00 mg/dL 0.91 0.85 0.96  Sodium 134 - 144 mmol/L 142 142 141  Potassium 3.5 - 5.2 mmol/L 4.3 4.0 3.0(L)  Chloride 96 - 106 mmol/L 100 107 104  CO2 20 - 29 mmol/L 24 27 27   Calcium 8.7 - 10.3 mg/dL 10.3 8.9 9.4  Total Protein 6.5 - 8.1 g/dL - - 7.2  Total Bilirubin 0.3 - 1.2 mg/dL - - 0.8  Alkaline Phos 38 - 126 U/L - - 71  AST 15 - 41 U/L - - 19  ALT 0 - 44 U/L - - 10   CBC Latest Ref Rng & Units 07/04/2019 12/21/2018 12/21/2018  WBC 4.0 - 10.5 K/uL 7.8 6.9 7.1  Hemoglobin 12.0 - 15.0 g/dL 9.5(L) 14.9 14.7  Hematocrit 36.0 - 46.0 % 34.3(L) 46.1(H) 45.8  Platelets 150 - 400 K/uL 542(H) 351 354   Lipid Panel     Component Value Date/Time   CHOL 166 05/12/2012 0237   TRIG 196 (H) 05/12/2012 0237   HDL 37 (L) 05/12/2012 0237   CHOLHDL 4.5 05/12/2012 0237   VLDL 39 05/12/2012 0237   LDLCALC 90 05/12/2012 0237   HEMOGLOBIN A1C Lab Results  Component Value Date   HGBA1C 5.6 12/26/2015   MPG 114 12/26/2015   TSH No results for input(s): TSH in the last 8760 hours.  PRN Meds:. There are no discontinued medications. Current Meds  Medication Sig  . albuterol (PROVENTIL HFA;VENTOLIN HFA) 108 (90 Base) MCG/ACT inhaler Inhale 1-2 puffs into the lungs every 6 (six)  hours as needed for wheezing or shortness of breath.  Marland Kitchen amLODipine (NORVASC) 10 MG tablet   . Ascorbic Acid (VITAMIN C PO) Take 1 tablet by mouth daily.  Marland Kitchen aspirin EC 81 MG tablet Take 81 mg by mouth daily.   . ferrous sulfate 325 (65 FE) MG EC tablet Take 1 tablet by mouth daily.  . furosemide (LASIX) 20 MG tablet Take 20 mg by mouth daily.  Marland Kitchen gabapentin (NEURONTIN) 600 MG tablet Take 1 tablet (600 mg total) by mouth at bedtime. (Patient taking differently: Take 600 mg by mouth at bedtime as needed (pain). )  . hydrALAZINE (APRESOLINE) 100 MG tablet TAKE 1 TABLET(100 MG) BY MOUTH THREE TIMES DAILY  . losartan-hydrochlorothiazide (HYZAAR) 100-25 MG tablet Take 1 tablet by mouth daily.  . meloxicam (MOBIC) 7.5 MG tablet Take 2 tablets (15 mg total) by mouth daily. (Patient taking differently: Take 7.5 mg by mouth daily. )  . metFORMIN (GLUCOPHAGE) 500 MG tablet Take 0.5 tablets (250 mg total) by mouth 2 (two) times daily with a meal.  . pantoprazole (PROTONIX) 40 MG tablet Take 40 mg by mouth daily.   . pravastatin (PRAVACHOL) 20 MG tablet Take 20 mg by mouth daily.  Marland Kitchen spironolactone (ALDACTONE) 50 MG tablet TAKE 1 TABLET(50 MG) BY MOUTH DAILY  . traMADol (ULTRAM) 50 MG tablet Take 1 tablet (50 mg total) by mouth every 12 (twelve) hours as needed.  . Vitamin D, Ergocalciferol, (DRISDOL) 1.25 MG (50000 UT) CAPS capsule Take 50,000 Units by mouth once a week.    Cardiac Studies:   Echocardiogram 02/18/2019: Left ventricle cavity is normal in size. Mild concentric hypertrophy of the left ventricle. Normal LV systolic function with EF 68%. Normal global wall motion. Doppler  evidence of grade I (impaired) diastolic dysfunction, normal LAP. Calculated EF 68%. Trileaflet aortic valve with mild calcification. Mild aortic valve stenosis. Aortic valve mean gradient of 12 mmHg, Vmax of 2.4  m/s. Calculated aortic valve area by continuity equation is 1.4 cm. No regurgitation. Trace mitral regurgitation.  Trace pulmonic regurgitation. Mild tricuspid regurgitation. Estimated pulmonary artery systolic pressure is 32 mmHg.  Exercise myoview stress 05/19/2017: 1. Patient achieved 4.4 METS and reached 83% target heart rate. Exercise capacity low for age. Appropriate heart rate and blood pressure response. Stress symptoms included dyspnea, dizziness. Submaximal exercise stress study. 2. No stress EKG changes suggestive of ischemia. 3. Stress and rest SPECT images demonstrate homogeneous tracer distribution throughout the myocardium. Gated SPECT imaging reveals normal myocardial thickening and wall motion. The left ventricular ejection fraction was normal (65%).  4. This is a low risk submaximal exercise stress study   Peripheral arteriogram 02/11/2017: Successful PTA with DCB 5x150x2 InPact Admiral in the right proximal to distal SFA and stenting of proximal SFA with DES, [place] 6x100 mm stent. 3 vessel r/o. Left mild disease and 2 vessel R/O. Mild disease left by angiogram 10/05/2013. Lower extremity arterial duplex 04/15/2018: No hemodynamically significant stenoses are identified in the lower extremity arterial system. Mild diffuse calcific plaque noted. This exam reveals mildly decreased perfusion of the right lower extremity, noted at the anterior tibial artery level (ABI 0.86) and normal perfusion of the left lower extremity (ABI 0.97). No signficant change since 05/28/2017. Study suggests patent right SFA angioplasty site.  Lower extremity arterial duplex 04/15/2018: No hemodynamically significant stenoses are identified in the lower extremity arterial system. Mild diffuse calcific plaque noted. This exam reveals mildly decreased perfusion of the right lower extremity, noted at the anterior tibial artery level (ABI 0.86) and normal perfusion of the left lower extremity (ABI 0.97). No signficant change since 05/28/2017. Study suggests patent right SFA angioplasty site.  Carotid artery duplex  07/25/2015: Antegrade right vertebral artery flow. Antegrade left vertebral artery flow. No hemodynamically significant arterial disease in the internal carotid artery bilaterally. No significant change from 11/26/2012.  Coronary angiogram 02/27/2014: Mid RCA 60-70% stenosis, mid LAD 60-70% stenosis. FFR to the LAD, lesion insignificant. Medical therapy for CAD. Moderate aortic valve calcification.  Assessment:     ICD-10-CM   1. Essential hypertension  B76 BASIC METABOLIC PANEL WITH GFR    Magnesium  2. Tobacco use disorder  F17.200   3. Type 2 diabetes mellitus with peripheral vascular disease (HCC)  E11.51   4. Iron deficiency anemia, unspecified iron deficiency anemia type  D50.9    EKG 07/08/2019: Normal sinus rhythm at 66 bpm with 1 PAC and 1 PVC, normal axis, PRWP cannot exclude anterior infarct old. No evidence of ischemia.   Recommendations:  Benign essential hypertension: Improving . Home blood pressure is at goal.  . Medication reconciled.  . Patient is asked to keep a log of both blood pressure and pulse so that medications can be titrated based on a blood pressure trend as opposed to isolated blood pressure readings in the office. . If the blood pressure is consistently greater than 187mmHg patient is asked to call the office to for medication titration sooner than the next office visit.  . Low salt diet recommended. A diet that is rich in fruits, vegetables, legumes, and low-fat dairy products and low in snacks, sweets, and meats (such as the Dietary Approaches to Stop Hypertension [DASH] diet).   Active tobacco use:  Tobacco cessation counseling (CPT 99406):  Currently smoking 0.5 packs/day    Patient was informed of the dangers of tobacco abuse including stroke, cancer, and MI, as well as benefits of tobacco cessation.  Patient iswilling to quit at this time.  Approximately 5 mins were spent counseling patient cessation techniques. We discussed various methods to help  quit smoking, including deciding on a date to quit, joining a support group, pharmacological agents- nicotine gum/patch/lozenges, chantix.   I will reassess her progress at the next follow-up visit  Non-insulin-dependent diabetes mellitus type 2 with peripheral vascular disease: Educated on importance of glycemic control.  Currently managed by primary team.  Anemia, most likely secondary to iron deficiency.  Patient is asked to follow-up with her primary care physician regarding the work-up of her underlying anemia.  Patient states that she has a PCP appointment tomorrow for her yearly physical.  Evaluation and management (5min) with time spent obtaining history, performing exam, reviewing labs, studies, greater than 50% of that time was spent in counseling and coordination care, discussion as state above, and documenting clinical evaluation.  Rex Kras, DO, [facility]. [place] Office: [phone]

## 2019-08-30 NOTE — Telephone Encounter (Signed)
Call pt 

## 2019-08-31 DIAGNOSIS — Z01021 Encounter for examination of eyes and vision following failed vision screening with abnormal findings: Secondary | ICD-10-CM | POA: Diagnosis not present

## 2019-08-31 DIAGNOSIS — J449 Chronic obstructive pulmonary disease, unspecified: Secondary | ICD-10-CM | POA: Diagnosis not present

## 2019-08-31 DIAGNOSIS — I10 Essential (primary) hypertension: Secondary | ICD-10-CM | POA: Diagnosis not present

## 2019-08-31 DIAGNOSIS — Z1329 Encounter for screening for other suspected endocrine disorder: Secondary | ICD-10-CM | POA: Diagnosis not present

## 2019-08-31 DIAGNOSIS — E1165 Type 2 diabetes mellitus with hyperglycemia: Secondary | ICD-10-CM | POA: Diagnosis not present

## 2019-08-31 DIAGNOSIS — E1140 Type 2 diabetes mellitus with diabetic neuropathy, unspecified: Secondary | ICD-10-CM | POA: Diagnosis not present

## 2019-08-31 DIAGNOSIS — Z01118 Encounter for examination of ears and hearing with other abnormal findings: Secondary | ICD-10-CM | POA: Diagnosis not present

## 2019-08-31 DIAGNOSIS — Z0001 Encounter for general adult medical examination with abnormal findings: Secondary | ICD-10-CM | POA: Diagnosis not present

## 2019-08-31 DIAGNOSIS — F17200 Nicotine dependence, unspecified, uncomplicated: Secondary | ICD-10-CM | POA: Diagnosis not present

## 2019-09-01 LAB — MAGNESIUM: Magnesium: 1.8 mg/dL (ref 1.6–2.3)

## 2019-09-08 ENCOUNTER — Ambulatory Visit (INDEPENDENT_AMBULATORY_CARE_PROVIDER_SITE_OTHER): Payer: Medicare HMO | Admitting: Orthopedic Surgery

## 2019-09-08 ENCOUNTER — Other Ambulatory Visit: Payer: Self-pay

## 2019-09-08 DIAGNOSIS — M1712 Unilateral primary osteoarthritis, left knee: Secondary | ICD-10-CM

## 2019-09-10 ENCOUNTER — Encounter: Payer: Self-pay | Admitting: Orthopedic Surgery

## 2019-09-10 NOTE — Progress Notes (Signed)
Office Visit Note   Patient: [place] Hanlon           Date of Birth: [birth year]           MRN: [id number] Visit Date: 09/08/2019 Requested by: Benito Mccreedy, MD [place] [address],  [place] [address] PCP: Benito Mccreedy, MD  Subjective: Chief Complaint  Patient presents with  . Follow-up    HPI: Patient presents for evaluation of left knee.  She had gel injection 08/25/2019.  Did get good relief but the pain is slowly recurring.  Wants to discuss a brace to the left knee.  She is wondering if an unloader brace would help.              ROS: All systems reviewed are negative as they relate to the chief complaint within the history of present illness.  Patient denies  fevers or chills.   Assessment & Plan: Visit Diagnoses:  1. Arthritis of left knee     Plan: Impression is end-stage arthritis left knee.  She is going to get a compression sleeve from the drugstore.  I do not think an unloader brace is really good to be particularly beneficial.  She really needs to decide at that time whether or not she wants to proceed with any type of surgery.  She does have a meniscal root type tear on that medial side which is not repairable for patients in this age group.  Her only option would be knee replacement.  That is a big surgery to undertake.  She will consider that and will talk in 4 weeks. Follow-Up Instructions: No follow-ups on file.   Orders:  No orders of the defined types were placed in this encounter.  No orders of the defined types were placed in this encounter.     Procedures: No procedures performed   Clinical Data: No additional findings.  Objective: Vital Signs: There were no vitals taken for this visit.  Physical Exam:   Constitutional: Patient appears well-developed HEENT:  Head: Normocephalic Eyes:EOM are normal Neck: Normal range of motion Cardiovascular: Normal rate Pulmonary/chest: Effort normal Neurologic: Patient is  alert Skin: Skin is warm Psychiatric: Patient has normal mood and affect    Ortho Exam: Ortho exam demonstrates slightly antalgic gait to the left.  Trace effusion present.  She does have pretty good range of motion full extension to past 90 of flexion.  Extensor mechanism is intact.  Collateral crucial ligaments are stable.  Pedal pulses palpable.  No masses lymphadenopathy or skin changes noted in that left knee region.  Specialty Comments:  No specialty comments available.  Imaging: No results found.   PMFS History: Patient Active Problem List   Diagnosis Date Noted  . Tobacco use disorder 09/18/2018  . Iron deficiency anemia 04/09/2018  . Symptomatic anemia 12/26/2015  . Hypokalemia 12/26/2015  . Type 2 diabetes mellitus with peripheral vascular disease ([place]) 12/26/2015  . Claudication in peripheral vascular disease ([place]) 10/05/2013  . Discomfort in chest 05/11/2012  . Nicotine dependence 05/11/2012  . Bradycardia 05/11/2012  . HTN (hypertension) 05/11/2012  . Back pain 05/11/2012   Past Medical History:  Diagnosis Date  . Anemia 12/26/2015  . Anxiety   . Arthritis   . Blood transfusion without reported diagnosis   . Chronic pain   . COPD (chronic obstructive pulmonary disease) ([place])   . Deep vein blood clot of right lower extremity ([place]) 12/29/2012   Dr. Nadyne Coombes, stent in place  . Depression   .  Diabetes mellitus without complication ([place])   . GERD (gastroesophageal reflux disease)   . Heart murmur   . Hyperlipemia   . Hypertension   . Sleep apnea    uses a cpap  . Wears glasses     Family History  Problem Relation Age of Onset  . Diabetes Mother   . Hypertension Mother   . Heart disease Mother   . Heart disease Father   . Hypertension Father   . Hypertension Sister   . Hypertension Brother   . Diabetes Brother   . Hypertension Sister   . Hypertension Brother   . Diabetes Brother   . Hypertension Brother   . Hypertension Brother   . Hypertension  Brother     Past Surgical History:  Procedure Laterality Date  . ABDOMINAL HYSTERECTOMY    . CARDIAC CATHETERIZATION  4/15  . CATARACT EXTRACTION    . CESAREAN SECTION    . COLONOSCOPY N/A 01/08/2013   Procedure: COLONOSCOPY;  Surgeon: Beryle Beams, MD;  Location: [facility] ENDOSCOPY;  Service: Endoscopy;  Laterality: N/A;  . COLONOSCOPY N/A 12/28/2015   Procedure: COLONOSCOPY;  Surgeon: Carol Ada, MD;  Location: [facility] ENDOSCOPY;  Service: Endoscopy;  Laterality: N/A;  . ENTEROSCOPY N/A 12/28/2015   Procedure: ENTEROSCOPY;  Surgeon: Carol Ada, MD;  Location: [facility];  Service: Endoscopy;  Laterality: N/A;  . ENTEROSCOPY N/A 02/20/2018   Procedure: ENTEROSCOPY;  Surgeon: Carol Ada, MD;  Location: [facility] ENDOSCOPY;  Service: Endoscopy;  Laterality: N/A;  . ENTEROSCOPY N/A 03/27/2018   Procedure: ENTEROSCOPY;  Surgeon: Carol Ada, MD;  Location: [facility] ENDOSCOPY;  Service: Endoscopy;  Laterality: N/A;  . ESOPHAGOGASTRODUODENOSCOPY N/A 01/08/2013   Procedure: ESOPHAGOGASTRODUODENOSCOPY (EGD);  Surgeon: Beryle Beams, MD;  Location: [facility] ENDOSCOPY;  Service: Endoscopy;  Laterality: N/A;  . EYE SURGERY    . HAND SURGERY    . HOT HEMOSTASIS N/A 12/28/2015   Procedure: HOT HEMOSTASIS (ARGON PLASMA COAGULATION/BICAP);  Surgeon: Carol Ada, MD;  Location: [facility] ENDOSCOPY;  Service: Endoscopy;  Laterality: N/A;  . HOT HEMOSTASIS N/A 02/20/2018   Procedure: HOT HEMOSTASIS (ARGON PLASMA COAGULATION/BICAP);  Surgeon: Carol Ada, MD;  Location: [facility] ENDOSCOPY;  Service: Endoscopy;  Laterality: N/A;  . HOT HEMOSTASIS N/A 03/27/2018   Procedure: HOT HEMOSTASIS (ARGON PLASMA COAGULATION/BICAP);  Surgeon: Carol Ada, MD;  Location: [facility] ENDOSCOPY;  Service: Endoscopy;  Laterality: N/A;  . LEFT HEART CATHETERIZATION WITH CORONARY ANGIOGRAM N/A 03/09/2013   Procedure: LEFT HEART CATHETERIZATION WITH CORONARY ANGIOGRAM;  Surgeon: Laverda Page, MD;  Location[facility] C[facility] LAB;  Service: Cardiovascular;  Laterality: N/A;    . LOWER EXTREMITY ANGIOGRAM N/A 12/29/2012   Procedure: LOWER EXTREMITY ANGIOGRAM;  Surgeon: Laverda Page, MD;  Location: [facility] CATH LAB;  Service: Cardiovascular;  Laterality: N/A;  . LOWER EXTREMITY ANGIOGRAM N/A 10/05/2013   Procedure: LOWER EXTREMITY ANGIOGRAM;  Surgeon: Laverda Page, MD;  Location: [facility] CATH LAB;  Service: Cardiovascular;  Laterality: N/A;  . LOWER EXTREMITY ANGIOGRAPHY N/A 02/11/2017   Procedure: Lower Extremity Angiography;  Surgeon: Adrian Prows, MD;  Location: [place] CV LAB;  Service: Cardiovascular;  Laterality: N/A;  . PERIPHERAL VASCULAR BALLOON ANGIOPLASTY  02/11/2017   Procedure: PERIPHERAL VASCULAR BALLOON ANGIOPLASTY;  Surgeon: Adrian Prows, MD;  Location: [place] CV LAB;  Service: Cardiovascular;;  Right SFA  . PERIPHERAL VASCULAR INTERVENTION Right 02/11/2017   Procedure: PERIPHERAL VASCULAR INTERVENTION;  Surgeon: Adrian Prows, MD;  Location: [facility] CV LAB;  Service: Cardiovascular;  Laterality: Right;  Rt SFA  .  stent in right leg  12/29/2012   for blood clot, Dr. Nadyne Coombes  . TYMPANOMASTOIDECTOMY Left 03/08/2014   Procedure: TYMPANOMASTOIDECTOMY LEFT;  Surgeon: Ascencion Dike, MD;  Location: [facility];  Service: ENT;  Laterality: Left;  . TYMPANOMASTOIDECTOMY  2015  . UTERINE FIBROID SURGERY     Social History   Occupational History  . Not on file  Tobacco Use  . Smoking status: Current Every Day Smoker    Packs/day: 0.50    Years: 41.00    Pack years: 20.50    Types: Cigarettes  . Smokeless tobacco: Never Used  Substance and Sexual Activity  . Alcohol use: Yes    Comment: rarely   . Drug use: Yes    Types: Marijuana    Comment: last-02/24/14  . Sexual activity: Not on file

## 2019-09-13 NOTE — Telephone Encounter (Signed)
Patient said she had the medication already, at the last office visit.

## 2019-09-30 DIAGNOSIS — E559 Vitamin D deficiency, unspecified: Secondary | ICD-10-CM | POA: Diagnosis not present

## 2019-09-30 DIAGNOSIS — I10 Essential (primary) hypertension: Secondary | ICD-10-CM | POA: Diagnosis not present

## 2019-09-30 DIAGNOSIS — F17200 Nicotine dependence, unspecified, uncomplicated: Secondary | ICD-10-CM | POA: Diagnosis not present

## 2019-09-30 DIAGNOSIS — K219 Gastro-esophageal reflux disease without esophagitis: Secondary | ICD-10-CM | POA: Diagnosis not present

## 2019-09-30 DIAGNOSIS — J449 Chronic obstructive pulmonary disease, unspecified: Secondary | ICD-10-CM | POA: Diagnosis not present

## 2019-09-30 DIAGNOSIS — E1165 Type 2 diabetes mellitus with hyperglycemia: Secondary | ICD-10-CM | POA: Diagnosis not present

## 2019-09-30 DIAGNOSIS — E7800 Pure hypercholesterolemia, unspecified: Secondary | ICD-10-CM | POA: Diagnosis not present

## 2019-09-30 DIAGNOSIS — E1140 Type 2 diabetes mellitus with diabetic neuropathy, unspecified: Secondary | ICD-10-CM | POA: Diagnosis not present

## 2019-10-06 ENCOUNTER — Other Ambulatory Visit: Payer: Self-pay | Admitting: Cardiology

## 2019-12-14 DIAGNOSIS — H6123 Impacted cerumen, bilateral: Secondary | ICD-10-CM | POA: Diagnosis not present

## 2019-12-14 DIAGNOSIS — H903 Sensorineural hearing loss, bilateral: Secondary | ICD-10-CM | POA: Diagnosis not present

## 2019-12-14 DIAGNOSIS — H7112 Cholesteatoma of tympanum, left ear: Secondary | ICD-10-CM | POA: Diagnosis not present

## 2020-01-31 ENCOUNTER — Telehealth: Payer: Self-pay | Admitting: Orthopedic Surgery

## 2020-01-31 NOTE — Telephone Encounter (Signed)
Pt called stating she discussed a knee replacement with Dr.Dean and would like to proceed; I did let her speak with Jackelyn Poling and just wanted to make Dr.Dean aware.  [phone]

## 2020-01-31 NOTE — Telephone Encounter (Signed)
I do not have a blue sheet for this patient.  Thanks

## 2020-01-31 NOTE — Telephone Encounter (Signed)
Do you have blue sheet for patient? If not please let me know so I can have Dr Marlou Sa to fill one out.  Thanks.

## 2020-02-01 DIAGNOSIS — J449 Chronic obstructive pulmonary disease, unspecified: Secondary | ICD-10-CM | POA: Diagnosis not present

## 2020-02-01 DIAGNOSIS — I10 Essential (primary) hypertension: Secondary | ICD-10-CM | POA: Diagnosis not present

## 2020-02-01 DIAGNOSIS — E1140 Type 2 diabetes mellitus with diabetic neuropathy, unspecified: Secondary | ICD-10-CM | POA: Diagnosis not present

## 2020-02-01 DIAGNOSIS — K219 Gastro-esophageal reflux disease without esophagitis: Secondary | ICD-10-CM | POA: Diagnosis not present

## 2020-02-01 DIAGNOSIS — E1165 Type 2 diabetes mellitus with hyperglycemia: Secondary | ICD-10-CM | POA: Diagnosis not present

## 2020-02-01 NOTE — Telephone Encounter (Signed)
Can you please fill out blue sheet so [name] can try to reach out to patient?

## 2020-02-02 NOTE — Telephone Encounter (Signed)
Done thx

## 2020-02-07 ENCOUNTER — Other Ambulatory Visit: Payer: Self-pay | Admitting: Internal Medicine

## 2020-02-07 DIAGNOSIS — Z1231 Encounter for screening mammogram for malignant neoplasm of breast: Secondary | ICD-10-CM

## 2020-02-10 ENCOUNTER — Telehealth: Payer: Self-pay | Admitting: Orthopedic Surgery

## 2020-02-10 NOTE — Telephone Encounter (Signed)
Left message on patient's voicemail in regards to scheduling left total knee case with Dr. Marlou Sa.

## 2020-02-16 ENCOUNTER — Ambulatory Visit
Admission: RE | Admit: 2020-02-16 | Discharge: 2020-02-16 | Disposition: A | Discharge status: Home / Self Care | Payer: Medicare HMO | Source: Ambulatory Visit | Attending: Internal Medicine | Admitting: Internal Medicine

## 2020-02-16 ENCOUNTER — Other Ambulatory Visit: Payer: Self-pay

## 2020-02-16 DIAGNOSIS — Z1231 Encounter for screening mammogram for malignant neoplasm of breast: Secondary | ICD-10-CM

## 2020-03-01 ENCOUNTER — Ambulatory Visit: Payer: Medicare Other | Admitting: Cardiology

## 2020-03-01 ENCOUNTER — Encounter: Payer: Self-pay | Admitting: Cardiology

## 2020-03-01 ENCOUNTER — Other Ambulatory Visit: Payer: Self-pay

## 2020-03-01 VITALS — BP 131/63 | HR 63 | Resp 16 | Ht 60.0 in | Wt 206.2 lb

## 2020-03-01 DIAGNOSIS — F17200 Nicotine dependence, unspecified, uncomplicated: Secondary | ICD-10-CM

## 2020-03-01 DIAGNOSIS — I10 Essential (primary) hypertension: Secondary | ICD-10-CM | POA: Diagnosis not present

## 2020-03-01 DIAGNOSIS — E1151 Type 2 diabetes mellitus with diabetic peripheral angiopathy without gangrene: Secondary | ICD-10-CM

## 2020-03-01 DIAGNOSIS — I2510 Atherosclerotic heart disease of native coronary artery without angina pectoris: Secondary | ICD-10-CM

## 2020-03-01 DIAGNOSIS — E119 Type 2 diabetes mellitus without complications: Secondary | ICD-10-CM | POA: Diagnosis not present

## 2020-03-01 DIAGNOSIS — I739 Peripheral vascular disease, unspecified: Secondary | ICD-10-CM | POA: Diagnosis not present

## 2020-03-01 NOTE — Progress Notes (Signed)
[name] Date of Birth: [birth year] MRN: [id number] Primary Care Provider:Osei-Bonsu, Iona Beard, MD Former Cardiology Providers: Jeri Lager, APRN, FNP-C Primary Cardiologist: Rex Kras, DO, [facility] (established care 08/30/2019)  Date: 03/01/20 Last Visit: 08/30/2019   Chief Complaint  Patient presents with  . Hypertension    HPI  [name] is a 69 y.o.  female who presents to the office with a chief complaint of " 22-month follow-up for management of blood pressure." Patient's past medical history and cardiovascular risk factors include: Established coronary artery disease without angina pectoris, hypertension, hyperlipidemia, active smoking, peripheral artery disease status post PV angiogram with revascularization of the SFA, non-insulin-dependent diabetes, postmenopausal female, advanced age, obesity due to excess calories, history of prior GI bleed.  Patient is being followed at the office for management of high blood pressure.  Patient states at home her systolic blood pressures are very well controlled with systolic blood pressures usually less than 130 mmHg.  Today's office blood pressure is also well controlled.  Patient is compliant with her medical therapy.  Since last office visit patient has discontinued furosemide as she was having to urinate a lot which was not hindering her day-to-day activities.  Patient has lost 13 pounds since last office visit secondary to decreasing caloric intake.  Patient does not participate in any structured exercise program or daily routine due to osteoarthritis of the left knee.  Lipids currently managed by primary team, per patient.  Patient continues to smoke at least half a pack per day.   FUNCTIONAL STATUS: No structured exercise program or daily routine limited to left knee pain.    ALLERGIES: No Known Allergies   MEDICATION LIST PRIOR TO VISIT: Current Outpatient Medications on File Prior to Visit  Medication  Sig Dispense Refill  . albuterol (PROVENTIL HFA;VENTOLIN HFA) 108 (90 Base) MCG/ACT inhaler Inhale 1-2 puffs into the lungs every 6 (six) hours as needed for wheezing or shortness of breath.    Marland Kitchen amLODipine (NORVASC) 10 MG tablet     . aspirin EC 81 MG tablet Take 81 mg by mouth daily.     Marland Kitchen gabapentin (NEURONTIN) 600 MG tablet Take 1 tablet (600 mg total) by mouth at bedtime. (Patient taking differently: Take 600 mg by mouth at bedtime as needed (pain). ) 30 tablet 11  . hydrALAZINE (APRESOLINE) 100 MG tablet TAKE 1 TABLET(100 MG) BY MOUTH THREE TIMES DAILY 270 tablet 1  . losartan-hydrochlorothiazide (HYZAAR) 100-25 MG tablet Take 1 tablet by mouth daily.    . metFORMIN (GLUCOPHAGE) 500 MG tablet Take 0.5 tablets (250 mg total) by mouth 2 (two) times daily with a meal.    . nitroGLYCERIN (NITROSTAT) 0.4 MG SL tablet Place 1 tablet (0.4 mg total) under the tongue every 5 (five) minutes as needed for chest pain. 90 tablet 3  . pantoprazole (PROTONIX) 40 MG tablet Take 40 mg by mouth daily.     . pravastatin (PRAVACHOL) 20 MG tablet Take 20 mg by mouth daily.    Marland Kitchen spironolactone (ALDACTONE) 50 MG tablet TAKE 1 TABLET(50 MG) BY MOUTH DAILY 90 tablet 1  . Vitamin D, Ergocalciferol, (DRISDOL) 1.25 MG (50000 UT) CAPS capsule Take 50,000 Units by mouth once a week.    . meloxicam (MOBIC) 7.5 MG tablet Take 2 tablets (15 mg total) by mouth daily. (Patient not taking: Reported on 03/01/2020) 10 tablet 0  . traMADol (ULTRAM) 50 MG tablet Take 1 tablet (50 mg total) by mouth every 12 (twelve) hours as needed. (Patient  not taking: Reported on 03/01/2020) 28 tablet 0   No current facility-administered medications on file prior to visit.    PAST MEDICAL HISTORY: Past Medical History:  Diagnosis Date  . Anemia 12/26/2015  . Anxiety   . Arthritis   . Blood transfusion without reported diagnosis   . Chronic pain   . COPD (chronic obstructive pulmonary disease) ([place])   . Deep vein blood clot of right lower  extremity ([place]) 12/29/2012   Dr. Nadyne Coombes, stent in place  . Depression   . Diabetes mellitus without complication ([place])   . GERD (gastroesophageal reflux disease)   . Heart murmur   . Hyperlipemia   . Hypertension   . Sleep apnea    uses a cpap  . Wears glasses     PAST SURGICAL HISTORY: Past Surgical History:  Procedure Laterality Date  . ABDOMINAL HYSTERECTOMY    . CARDIAC CATHETERIZATION  4/15  . CATARACT EXTRACTION    . CESAREAN SECTION    . COLONOSCOPY N/A 01/08/2013   Procedure: COLONOSCOPY;  Surgeon: Beryle Beams, MD;  Location: [facility] ENDOSCOPY;  Service: Endoscopy;  Laterality: N/A;  . COLONOSCOPY N/A 12/28/2015   Procedure: COLONOSCOPY;  Surgeon: Carol Ada, MD;  Location: [facility] ENDOSCOPY;  Service: Endoscopy;  Laterality: N/A;  . ENTEROSCOPY N/A 12/28/2015   Procedure: ENTEROSCOPY;  Surgeon: Carol Ada, MD;  Location: [place];  Service: Endoscopy;  Laterality: N/A;  . ENTEROSCOPY N/A 02/20/2018   Procedure: ENTEROSCOPY;  Surgeon: Carol Ada, MD;  Location: [facility] ENDOSCOPY;  Service: Endoscopy;  Laterality: N/A;  . ENTEROSCOPY N/A 03/27/2018   Procedure: ENTEROSCOPY;  Surgeon: Carol Ada, MD;  Location: [facility] ENDOSCOPY;  Service: Endoscopy;  Laterality: N/A;  . ESOPHAGOGASTRODUODENOSCOPY N/A 01/08/2013   Procedure: ESOPHAGOGASTRODUODENOSCOPY (EGD);  Surgeon: Beryle Beams, MD;  Location: [facility] ENDOSCOPY;  Service: Endoscopy;  Laterality: N/A;  . EYE SURGERY    . HAND SURGERY    . HOT HEMOSTASIS N/A 12/28/2015   Procedure: HOT HEMOSTASIS (ARGON PLASMA COAGULATION/BICAP);  Surgeon: Carol Ada, MD;  Location: [facility] ENDOSCOPY;  Service: Endoscopy;  Laterality: N/A;  . HOT HEMOSTASIS N/A 02/20/2018   Procedure: HOT HEMOSTASIS (ARGON PLASMA COAGULATION/BICAP);  Surgeon: Carol Ada, MD;  Location: [facility] ENDOSCOPY;  Service: Endoscopy;  Laterality: N/A;  . HOT HEMOSTASIS N/A 03/27/2018   Procedure: HOT HEMOSTASIS (ARGON PLASMA COAGULATION/BICAP);  Surgeon: Carol Ada, MD;  Location: [facility]  ENDOSCOPY;  Service: Endoscopy;  Laterality: N/A;  . LEFT HEART CATHETERIZATION WITH CORONARY ANGIOGRAM N/A 03/09/2013   Procedure: LEFT HEART CATHETERIZATION WITH CORONARY ANGIOGRAM;  Surgeon: Laverda Page, MD;  Location: [facility] CATH LAB;  Service: Cardiovascular;  Laterality: N/A;  . LOWER EXTREMITY ANGIOGRAM N/A 12/29/2012   Procedure: LOWER EXTREMITY ANGIOGRAM;  Surgeon: Laverda Page, MD;  Location: [facility] CATH LAB;  Service: Cardiovascular;  Laterality: N/A;  . LOWER EXTREMITY ANGIOGRAM N/A 10/05/2013   Procedure: LOWER EXTREMITY ANGIOGRAM;  Surgeon: Laverda Page, MD;  Location: [facility] CATH LAB;  Service: Cardiovascular;  Laterality: N/A;  . LOWER EXTREMITY ANGIOGRAPHY N/A 02/11/2017   Procedure: Lower Extremity Angiography;  Surgeon: Adrian Prows, MD;  Location: [place] CV LAB;  Service: Cardiovascular;  Laterality: N/A;  . PERIPHERAL VASCULAR BALLOON ANGIOPLASTY  02/11/2017   Procedure: PERIPHERAL VASCULAR BALLOON ANGIOPLASTY;  Surgeon: Adrian Prows, MD;  Location: [place] CV LAB;  Service: Cardiovascular;;  Right SFA  . PERIPHERAL VASCULAR INTERVENTION Right 02/11/2017   Procedure: PERIPHERAL VASCULAR INTERVENTION;  Surgeon: Adrian Prows, MD;  Location: [place] CV LAB;  Service:  Cardiovascular;  Laterality: Right;  Rt SFA  . stent in right leg  12/29/2012   for blood clot, Dr. Nadyne Coombes  . TYMPANOMASTOIDECTOMY Left 03/08/2014   Procedure: TYMPANOMASTOIDECTOMY LEFT;  Surgeon: Ascencion Dike, MD;  Location: [place];  Service: ENT;  Laterality: Left;  . TYMPANOMASTOIDECTOMY  2015  . UTERINE FIBROID SURGERY      FAMILY HISTORY: The patient's family history includes Diabetes in her brother, brother, and mother; Heart disease in her father and mother; Hypertension in her brother, brother, brother, brother, brother, father, mother, sister, and sister.   SOCIAL HISTORY:  The patient  reports that she has been smoking cigarettes. She has a 20.50 pack-year smoking history. She has  never used smokeless tobacco. She reports current alcohol use. She reports current drug use. Drug: Marijuana.  Review of Systems  Constitutional: Positive for weight loss. Negative for chills and fever.  HENT: Negative for hoarse voice and nosebleeds.   Eyes: Negative for discharge, double vision and pain.  Cardiovascular: Negative for chest pain, claudication, dyspnea on exertion, leg swelling, near-syncope, orthopnea, palpitations, paroxysmal nocturnal dyspnea and syncope.  Respiratory: Negative for hemoptysis and shortness of breath.   Musculoskeletal: Positive for arthritis and joint pain. Negative for muscle cramps and myalgias.  Gastrointestinal: Negative for abdominal pain, constipation, diarrhea, hematemesis, hematochezia, melena, nausea and vomiting.  Neurological: Negative for dizziness and light-headedness.    PHYSICAL EXAM: Vitals with BMI 03/01/2020 08/30/2019 08/06/2019  Height 5\' 0"  5\' 0"  -  Weight 206 lbs 3 oz 219 lbs -  BMI 59.56 38.75 -  Systolic 643 329 518  Diastolic 63 67 74  Pulse 63 62 57    CONSTITUTIONAL: Well-developed and well-nourished. No acute distress.  SKIN: Skin is warm and dry. No rash noted. No cyanosis. No pallor. No jaundice HEAD: Normocephalic and atraumatic.  EYES: No scleral icterus MOUTH/THROAT: Moist oral membranes.  NECK: No JVD present. No thyromegaly noted.  Soft bilateral carotid bruits  LYMPHATIC: No visible cervical adenopathy.  CHEST Normal respiratory effort. No intercostal retractions  LUNGS: Clear to auscultation bilaterally.  No stridor. No wheezes. No rales.  CARDIOVASCULAR: Regular, positive A4-Z6, soft systolic ejection murmur, no gallops or rubs. ABDOMINAL: Obese, soft, nontender, nondistended, positive bowel sounds in all 4 quadrants, no apparent ascites.  EXTREMITIES: No peripheral edema, warm to touch bilaterally.  Diminished DP/PT pulses bilaterally.  HEMATOLOGIC: No significant bruising NEUROLOGIC: Oriented to person, place,  and time. Nonfocal. Normal muscle tone.  PSYCHIATRIC: Normal mood and affect. Normal behavior. Cooperative  CARDIAC DATABASE: EKG: 03/01/2020:Sinus  Rhythm, 63bpm, normal axis, PRWP, without underlying ischemia or injury pattern. Frequent PACs.   Echocardiogram: PCV ECHOCARDIOGRAM COMPLETE 02/17/2019 Left ventricle cavity is normal in size. Mild concentric hypertrophy of the left ventricle. Normal LV systolic function with EF 68%. Normal global wall motion. Doppler evidence of grade I (impaired) diastolic dysfunction, normal LAP. Calculated EF 68%. Trileaflet aortic valve with mild calcification. Mild aortic valve stenosis. Aortic valve mean gradient of 12 mmHg, Vmax of 2.4  m/s. Calculated aortic valve area by continuity equation is 1.4 cm. No regurgitation. Trace mitral regurgitation. Trace pulmonic regurgitation. Mild tricuspid regurgitation. Estimated pulmonary artery systolic pressure is 32 mmHg.   Stress Testing:  Exercise myoview stress 05/19/2017: 1. Patient achieved 4.4 METS and reached 83% target heart rate. Exercise capacity low for age. Appropriate heart rate and blood pressure response. Stress symptoms included dyspnea, dizziness. Submaximal exercise stress study. 2. No stress EKG changes suggestive of ischemia. 3. Stress and rest  SPECT images demonstrate homogeneous tracer distribution throughout the myocardium. Gated SPECT imaging reveals normal myocardial thickening and wall motion. The left ventricular ejection fraction was normal (65%).  4. This is a low risk submaximal exercise stress study  Heart Catheterization: Coronary angiogram 02/27/2014:Mid RCA 60-70% stenosis, mid LAD 60-70% stenosis. FFR to the LAD, lesion insignificant. Medical therapy for CAD. Moderate aortic valve calcification.  Carotid artery duplex 07/25/2015:Antegrade right vertebral artery flow. Antegrade left vertebral artery flow. No hemodynamically significant arterial disease in the internal carotid  artery bilaterally. No significant change from 11/26/2012.  Peripheral arteriogram 02/11/2017:Successful PTA with DCB 5x150x2 InPact Admiral in the right proximal to distal SFA and stenting of proximal SFA with DES, [place] 6x100 mm stent. 3 vessel r/o. Left mild disease and 2 vessel R/O. Mild disease left by angiogram 10/05/2013.   Lower extremity arterial duplex 04/15/2018: No hemodynamically significant stenoses are identified in the lower extremity arterial system. Mild diffuse calcific plaque noted. This exam reveals mildly decreased perfusion of the right lower extremity, noted at the anterior tibial artery level (ABI 0.86) and normal perfusion of the left lower extremity (ABI 0.97). No signficant change since 05/28/2017. Study suggests patent right SFA angioplasty site  LABORATORY DATA: CBC Latest Ref Rng & Units 07/04/2019 12/21/2018 12/21/2018  WBC 4.0 - 10.5 K/uL 7.8 6.9 7.1  Hemoglobin 12.0 - 15.0 g/dL 9.5(L) 14.9 14.7  Hematocrit 36 - 46 % 34.3(L) 46.1(H) 45.8  Platelets 150 - 400 K/uL 542(H) 351 354    CMP Latest Ref Rng & Units 07/23/2019 07/04/2019 12/21/2018  Glucose 65 - 99 mg/dL 91 91 119(H)  BUN 8 - 27 mg/dL 17 17 13   Creatinine 0.57 - 1.00 mg/dL 0.91 0.85 0.96  Sodium 134 - 144 mmol/L 142 142 141  Potassium 3.5 - 5.2 mmol/L 4.3 4.0 3.0(L)  Chloride 96 - 106 mmol/L 100 107 104  CO2 20 - 29 mmol/L 24 27 27   Calcium 8.7 - 10.3 mg/dL 10.3 8.9 9.4  Total Protein 6.5 - 8.1 g/dL - - 7.2  Total Bilirubin 0.3 - 1.2 mg/dL - - 0.8  Alkaline Phos 38 - 126 U/L - - 71  AST 15 - 41 U/L - - 19  ALT 0 - 44 U/L - - 10    Lipid Panel     Component Value Date/Time   CHOL 166 05/12/2012 0237   TRIG 196 (H) 05/12/2012 0237   HDL 37 (L) 05/12/2012 0237   CHOLHDL 4.5 05/12/2012 0237   VLDL 39 05/12/2012 0237   LDLCALC 90 05/12/2012 0237    Lab Results  Component Value Date   HGBA1C 5.6 12/26/2015   HGBA1C 6.7 (H) 01/10/2011   No components found for: NTPROBNP Lab Results   Component Value Date   TSH 1.528 05/11/2012   TSH 2.688 01/10/2011   TSH 1.637 12/18/2007    Cardiac Panel (last 3 results) No results for input(s): CKTOTAL, CKMB, TROPONINIHS, RELINDX in the last 72 hours.  IMPRESSION:    ICD-10-CM   1. Essential hypertension  I10 EKG 82-NFAO    BASIC METABOLIC PANEL WITH GFR    Magnesium  2. Atherosclerosis of native coronary artery of native heart without angina pectoris  I25.10   3. Non-insulin dependent type 2 diabetes mellitus ([place])  E11.9   4. PAD (peripheral artery disease) ([facility])  I73.9   5. Tobacco use disorder  F17.200   6. Type 2 diabetes mellitus with peripheral vascular disease ([place])  E11.51      RECOMMENDATIONS: Tera Partridge  Burditt is a 69 y.o. female whose past medical history and cardiovascular risk factors include: Established coronary artery disease without angina pectoris, hypertension, hyperlipidemia, active smoking, peripheral artery disease status post PV angiogram with revascularization of the SFA, non-insulin-dependent diabetes, postmenopausal female, advanced age, obesity due to excess calories, history of prior GI bleed.  Benign essential hypertension: Office blood pressure well controlled.  Patient states that her home systolic blood pressures usually are less than 130 mmHg.  Medications reconciled.  We will check BMP and magnesium level as the patient is on multiple antihypertensive medications to make sure potassium and kidney function within normal limits.  Low-salt diet recommended.  Atherosclerosis of the native coronary artery without angina pectoris: Based on the left heart catheterization report noted above patient does have obstructive CAD.  Which is currently being managed medically. Continue statin therapy. Continue aspirin. Continue guideline directed medical therapy. Patient states that her lipids are being followed by her PCP.  I have asked her to bring the most recent lipid profile for review at the next  visit. No cardiovascular testing needed at this time.  Non-insulin-dependent diabetes mellitus type 2: Educated on the importance of glycemic control.  Currently managed by primary care provider.  Peripheral artery disease: Patient has undergone peripheral intervention in the past.  She currently denies symptoms of claudication.  Continue aspirin and statin therapy educated on the importance of complete smoking cessation.  Monitor for now.  Active cigarette smoker: Educated on the importance of complete smoking cessation.  Patient states that she has been smoking since the age of 67 and is difficult to stop.  Patient states she has tried lozenges and gums in the past.  We discussed Wellbutrin or Chantix to help.  Patient states that she will discuss it with her PCP.   FINAL MEDICATION LIST END OF ENCOUNTER: No orders of the defined types were placed in this encounter.    Current Outpatient Medications:  .  albuterol (PROVENTIL HFA;VENTOLIN HFA) 108 (90 Base) MCG/ACT inhaler, Inhale 1-2 puffs into the lungs every 6 (six) hours as needed for wheezing or shortness of breath., Disp: , Rfl:  .  amLODipine (NORVASC) 10 MG tablet, , Disp: , Rfl:  .  aspirin EC 81 MG tablet, Take 81 mg by mouth daily. , Disp: , Rfl:  .  gabapentin (NEURONTIN) 600 MG tablet, Take 1 tablet (600 mg total) by mouth at bedtime. (Patient taking differently: Take 600 mg by mouth at bedtime as needed (pain). ), Disp: 30 tablet, Rfl: 11 .  hydrALAZINE (APRESOLINE) 100 MG tablet, TAKE 1 TABLET(100 MG) BY MOUTH THREE TIMES DAILY, Disp: 270 tablet, Rfl: 1 .  losartan-hydrochlorothiazide (HYZAAR) 100-25 MG tablet, Take 1 tablet by mouth daily., Disp: , Rfl:  .  metFORMIN (GLUCOPHAGE) 500 MG tablet, Take 0.5 tablets (250 mg total) by mouth 2 (two) times daily with a meal., Disp: , Rfl:  .  nitroGLYCERIN (NITROSTAT) 0.4 MG SL tablet, Place 1 tablet (0.4 mg total) under the tongue every 5 (five) minutes as needed for chest pain.,  Disp: 90 tablet, Rfl: 3 .  pantoprazole (PROTONIX) 40 MG tablet, Take 40 mg by mouth daily. , Disp: , Rfl:  .  pravastatin (PRAVACHOL) 20 MG tablet, Take 20 mg by mouth daily., Disp: , Rfl:  .  spironolactone (ALDACTONE) 50 MG tablet, TAKE 1 TABLET(50 MG) BY MOUTH DAILY, Disp: 90 tablet, Rfl: 1 .  Vitamin D, Ergocalciferol, (DRISDOL) 1.25 MG (50000 UT) CAPS capsule, Take 50,000 Units by mouth once  a week., Disp: , Rfl:  .  meloxicam (MOBIC) 7.5 MG tablet, Take 2 tablets (15 mg total) by mouth daily. (Patient not taking: Reported on 03/01/2020), Disp: 10 tablet, Rfl: 0 .  traMADol (ULTRAM) 50 MG tablet, Take 1 tablet (50 mg total) by mouth every 12 (twelve) hours as needed. (Patient not taking: Reported on 03/01/2020), Disp: 28 tablet, Rfl: 0  Orders Placed This Encounter  Procedures  . BASIC METABOLIC PANEL WITH GFR  . Magnesium  . EKG 12-Lead   --Continue cardiac medications as reconciled in final medication list. --Return in about 6 months (around 08/29/2020) for Reevaluation of, BP. Or sooner if needed. --Continue follow-up with your primary care physician regarding the management of your other chronic comorbid conditions.  Patient's questions and concerns were addressed to her satisfaction. She voices understanding of the instructions provided during this encounter.   This note was created using a voice recognition software as a result there may be grammatical errors inadvertently enclosed that do not reflect the nature of this encounter. Every attempt is made to correct such errors.  Total time spent: 32 minutes.  Rex Kras, [place], [facility]  Pager: [phone] Office: [phone]

## 2020-03-06 ENCOUNTER — Ambulatory Visit: Payer: Medicare Other | Admitting: Orthopedic Surgery

## 2020-03-07 DIAGNOSIS — E1140 Type 2 diabetes mellitus with diabetic neuropathy, unspecified: Secondary | ICD-10-CM | POA: Diagnosis not present

## 2020-03-07 DIAGNOSIS — K219 Gastro-esophageal reflux disease without esophagitis: Secondary | ICD-10-CM | POA: Diagnosis not present

## 2020-03-07 DIAGNOSIS — E1165 Type 2 diabetes mellitus with hyperglycemia: Secondary | ICD-10-CM | POA: Diagnosis not present

## 2020-03-07 DIAGNOSIS — L0232 Furuncle of buttock: Secondary | ICD-10-CM | POA: Diagnosis not present

## 2020-03-07 DIAGNOSIS — J449 Chronic obstructive pulmonary disease, unspecified: Secondary | ICD-10-CM | POA: Diagnosis not present

## 2020-03-07 DIAGNOSIS — I10 Essential (primary) hypertension: Secondary | ICD-10-CM | POA: Diagnosis not present

## 2020-03-08 LAB — MAGNESIUM: Magnesium: 1.7 mg/dL (ref 1.6–2.3)

## 2020-03-11 ENCOUNTER — Other Ambulatory Visit: Payer: Self-pay | Admitting: Cardiology

## 2020-03-16 ENCOUNTER — Other Ambulatory Visit: Payer: Self-pay

## 2020-03-17 ENCOUNTER — Ambulatory Visit: Payer: Self-pay

## 2020-03-17 ENCOUNTER — Ambulatory Visit (INDEPENDENT_AMBULATORY_CARE_PROVIDER_SITE_OTHER): Payer: Medicare Other | Admitting: Orthopedic Surgery

## 2020-03-17 ENCOUNTER — Encounter: Payer: Self-pay | Admitting: Orthopedic Surgery

## 2020-03-17 VITALS — Ht 60.0 in | Wt 207.0 lb

## 2020-03-17 DIAGNOSIS — G8929 Other chronic pain: Secondary | ICD-10-CM

## 2020-03-17 DIAGNOSIS — M25562 Pain in left knee: Secondary | ICD-10-CM | POA: Diagnosis not present

## 2020-03-18 ENCOUNTER — Encounter: Payer: Self-pay | Admitting: Orthopedic Surgery

## 2020-03-18 NOTE — Progress Notes (Signed)
Office Visit Note   Patient: [name]           Date of Birth: 04-16-1951           MRN: [id number] Visit Date: 03/17/2020 Requested by: Benito Mccreedy, MD [address] [place],  [place] [address] PCP: Benito Mccreedy, MD  Subjective: Chief Complaint  Patient presents with  . Left Knee - Pain    HPI: [name] is a 69 year old patient with left knee pain.  She states that going up the stairs are difficult.  Hard for her to put weight on the leg.  Currently not taking medication for pain and she is out of tramadol.  She lives in an upper apartment which requires 2 flights of steps.  She will have to stay with someone else until her strength improves before going back to that daily routine.  No personal or family history of DVT or pulmonary embolism.              ROS: All systems reviewed are negative as they relate to the chief complaint within the history of present illness.  Patient denies  fevers or chills.   Assessment & Plan: Visit Diagnoses:  1. Chronic pain of left knee     Plan: Impression is left knee arthritis.  Plan is left total knee replacement.  Risk benefits are discussed with the patient include not limited to infection nerve vessel damage knee stiffness as well as potential need for revision.  Patient understands risk and benefits and wishes to proceed.  All questions answered.  Follow-Up Instructions: No follow-ups on file.   Orders:  Orders Placed This Encounter  Procedures  . XR KNEE 3 VIEW LEFT   No orders of the defined types were placed in this encounter.     Procedures: No procedures performed   Clinical Data: No additional findings.  Objective: Vital Signs: Ht 5' (1.524 m)   Wt 207 lb (93.9 kg)   BMI 40.43 kg/m   Physical Exam:   Constitutional: Patient appears well-developed HEENT:  Head: Normocephalic Eyes:EOM are normal Neck: Normal range of motion Cardiovascular: Normal rate Pulmonary/chest:  Effort normal Neurologic: Patient is alert Skin: Skin is warm Psychiatric: Patient has normal mood and affect    Ortho Exam: Ortho exam demonstrates perfused foot.  Pedal pulses palpable.  Ankle dorsiflexion intact.  She does have a slight flexion contracture in the left knee less than 5 degrees.  Flexion is to 120.  Medial and lateral joint line tenderness is present with patellofemoral crepitus noted bilaterally.  Specialty Comments:  No specialty comments available.  Imaging: XR KNEE 3 VIEW LEFT  Result Date: 03/18/2020 AP lateral merchant left knee reviewed.  Moderate to moderately severe tricompartmental arthritis noted in the left knee.  Spurring is present at the medial epicondyle as well as at the joint line.  Some popliteal calcification is noted.  Patellofemoral arthritis also present.  No acute fracture.  Left knee arthritis more severe than the right knee arthritis    PMFS History: Patient Active Problem List   Diagnosis Date Noted  . Tobacco use disorder 09/18/2018  . Iron deficiency anemia 04/09/2018  . Symptomatic anemia 12/26/2015  . Hypokalemia 12/26/2015  . Type 2 diabetes mellitus with peripheral vascular disease ([place]) 12/26/2015  . Claudication in peripheral vascular disease ([place]) 10/05/2013  . Discomfort in chest 05/11/2012  . Nicotine dependence 05/11/2012  . Bradycardia 05/11/2012  . HTN (hypertension) 05/11/2012  . Back pain 05/11/2012  Past Medical History:  Diagnosis Date  . Anemia 12/26/2015  . Anxiety   . Arthritis   . Blood transfusion without reported diagnosis   . Chronic pain   . COPD (chronic obstructive pulmonary disease) ([place])   . Deep vein blood clot of right lower extremity ([place]) 12/29/2012   Dr. Nadyne Coombes, stent in place  . Depression   . Diabetes mellitus without complication ([place])   . GERD (gastroesophageal reflux disease)   . Heart murmur   . Hyperlipemia   . Hypertension   . Sleep apnea    uses a cpap  . Wears glasses       Family History  Problem Relation Age of Onset  . Diabetes Mother   . Hypertension Mother   . Heart disease Mother   . Heart disease Father   . Hypertension Father   . Hypertension Sister   . Hypertension Brother   . Diabetes Brother   . Hypertension Sister   . Hypertension Brother   . Diabetes Brother   . Hypertension Brother   . Hypertension Brother   . Hypertension Brother     Past Surgical History:  Procedure Laterality Date  . ABDOMINAL HYSTERECTOMY    . CARDIAC CATHETERIZATION  4/15  . CATARACT EXTRACTION    . CESAREAN SECTION    . COLONOSCOPY N/A 01/08/2013   Procedure: COLONOSCOPY;  Surgeon: Beryle Beams, MD;  Location: [facility] ENDOSCOPY;  Service: Endoscopy;  Laterality: N/A;  . COLONOSCOPY N/A 12/28/2015   Procedure: COLONOSCOPY;  Surgeon: Carol Ada, MD;  Location: [facility] ENDOSCOPY;  Service: Endoscopy;  Laterality: N/A;  . ENTEROSCOPY N/A 12/28/2015   Procedure: ENTEROSCOPY;  Surgeon: Carol Ada, MD;  Location: [facility];  Service: Endoscopy;  Laterality: N/A;  . ENTEROSCOPY N/A 02/20/2018   Procedure: ENTEROSCOPY;  Surgeon: Carol Ada, MD;  Location: [facility] ENDOSCOPY;  Service: Endoscopy;  Laterality: N/A;  . ENTEROSCOPY N/A 03/27/2018   Procedure: ENTEROSCOPY;  Surgeon: Carol Ada, MD;  Location: [facility] ENDOSCOPY;  Service: Endoscopy;  Laterality: N/A;  . ESOPHAGOGASTRODUODENOSCOPY N/A 01/08/2013   Procedure: ESOPHAGOGASTRODUODENOSCOPY (EGD);  Surgeon: Beryle Beams, MD;  Location: [facility] ENDOSCOPY;  Service: Endoscopy;  Laterality: N/A;  . EYE SURGERY    . HAND SURGERY    . HOT HEMOSTASIS N/A 12/28/2015   Procedure: HOT HEMOSTASIS (ARGON PLASMA COAGULATION/BICAP);  Surgeon: Carol Ada, MD;  Location: [facility] ENDOSCOPY;  Service: Endoscopy;  Laterality: N/A;  . HOT HEMOSTASIS N/A 02/20/2018   Procedure: HOT HEMOSTASIS (ARGON PLASMA COAGULATION/BICAP);  Surgeon: Carol Ada, MD;  Location: [facility] ENDOSCOPY;  Service: Endoscopy;  Laterality: N/A;  . HOT HEMOSTASIS N/A 03/27/2018    Procedure: HOT HEMOSTASIS (ARGON PLASMA COAGULATION/BICAP);  Surgeon: Carol Ada, MD;  Location: [facility] ENDOSCOPY;  Service: Endoscopy;  Laterality: N/A;  . LEFT HEART CATHETERIZATION WITH CORONARY ANGIOGRAM N/A 03/09/2013   Procedure: LEFT HEART CATHETERIZATION WITH CORONARY ANGIOGRAM;  Surgeon: Laverda Page, MD;  Location: [facility] CATH LAB;  Service: Cardiovascular;  Laterality: N/A;  . LOWER EXTREMITY ANGIOGRAM N/A 12/29/2012   Procedure: LOWER EXTREMITY ANGIOGRAM;  Surgeon: Laverda Page, MD;  Location: [facility] CATH LAB;  Service: Cardiovascular;  Laterality: N/A;  . LOWER EXTREMITY ANGIOGRAM N/A 10/05/2013   Procedure: LOWER EXTREMITY ANGIOGRAM;  Surgeon: Laverda Page, MD;  Location: [facility] CATH LAB;  Service: Cardiovascular;  Laterality: N/A;  . LOWER EXTREMITY ANGIOGRAPHY N/A 02/11/2017   Procedure: Lower Extremity Angiography;  Surgeon: Adrian Prows, MD;  Location: [place] CV LAB;  Service: Cardiovascular;  Laterality: N/A;  .  PERIPHERAL VASCULAR BALLOON ANGIOPLASTY  02/11/2017   Procedure: PERIPHERAL VASCULAR BALLOON ANGIOPLASTY;  Surgeon: Adrian Prows, MD;  Location: [place] CV LAB;  Service: Cardiovascular;;  Right SFA  . PERIPHERAL VASCULAR INTERVENTION Right 02/11/2017   Procedure: PERIPHERAL VASCULAR INTERVENTION;  Surgeon: Adrian Prows, MD;  Location: [place] CV LAB;  Service: Cardiovascular;  Laterality: Right;  Rt SFA  . stent in right leg  12/29/2012   for blood clot, Dr. Nadyne Coombes  . TYMPANOMASTOIDECTOMY Left 03/08/2014   Procedure: TYMPANOMASTOIDECTOMY LEFT;  Surgeon: Ascencion Dike, MD;  Location: [facility];  Service: ENT;  Laterality: Left;  . TYMPANOMASTOIDECTOMY  2015  . UTERINE FIBROID SURGERY     Social History   Occupational History  . Not on file  Tobacco Use  . Smoking status: Current Every Day Smoker    Packs/day: 0.50    Years: 41.00    Pack years: 20.50    Types: Cigarettes  . Smokeless tobacco: Never Used  Vaping Use  . Vaping Use: Never used    Substance and Sexual Activity  . Alcohol use: Yes    Comment: rarely   . Drug use: Yes    Types: Marijuana    Comment: last-02/24/14  . Sexual activity: Not on file

## 2020-04-04 DIAGNOSIS — E1165 Type 2 diabetes mellitus with hyperglycemia: Secondary | ICD-10-CM | POA: Diagnosis not present

## 2020-04-04 DIAGNOSIS — I10 Essential (primary) hypertension: Secondary | ICD-10-CM | POA: Diagnosis not present

## 2020-04-04 DIAGNOSIS — J449 Chronic obstructive pulmonary disease, unspecified: Secondary | ICD-10-CM | POA: Diagnosis not present

## 2020-04-04 DIAGNOSIS — R109 Unspecified abdominal pain: Secondary | ICD-10-CM | POA: Diagnosis not present

## 2020-04-04 DIAGNOSIS — K219 Gastro-esophageal reflux disease without esophagitis: Secondary | ICD-10-CM | POA: Diagnosis not present

## 2020-04-05 ENCOUNTER — Other Ambulatory Visit: Payer: Self-pay

## 2020-04-05 ENCOUNTER — Emergency Department (HOSPITAL_COMMUNITY)
Admission: EM | Admit: 2020-04-05 | Discharge: 2020-04-05 | Disposition: A | Discharge status: Home / Self Care | Payer: Medicare Other | Attending: Emergency Medicine | Admitting: Emergency Medicine

## 2020-04-05 ENCOUNTER — Emergency Department (HOSPITAL_COMMUNITY): Payer: Medicare Other

## 2020-04-05 ENCOUNTER — Encounter (HOSPITAL_COMMUNITY): Payer: Self-pay | Admitting: *Deleted

## 2020-04-05 DIAGNOSIS — E119 Type 2 diabetes mellitus without complications: Secondary | ICD-10-CM | POA: Diagnosis not present

## 2020-04-05 DIAGNOSIS — K219 Gastro-esophageal reflux disease without esophagitis: Secondary | ICD-10-CM | POA: Diagnosis not present

## 2020-04-05 DIAGNOSIS — K5792 Diverticulitis of intestine, part unspecified, without perforation or abscess without bleeding: Secondary | ICD-10-CM

## 2020-04-05 DIAGNOSIS — R1030 Lower abdominal pain, unspecified: Secondary | ICD-10-CM | POA: Diagnosis present

## 2020-04-05 DIAGNOSIS — I10 Essential (primary) hypertension: Secondary | ICD-10-CM | POA: Diagnosis not present

## 2020-04-05 DIAGNOSIS — R109 Unspecified abdominal pain: Secondary | ICD-10-CM | POA: Diagnosis not present

## 2020-04-05 DIAGNOSIS — Z7982 Long term (current) use of aspirin: Secondary | ICD-10-CM | POA: Insufficient documentation

## 2020-04-05 DIAGNOSIS — F17210 Nicotine dependence, cigarettes, uncomplicated: Secondary | ICD-10-CM | POA: Diagnosis not present

## 2020-04-05 DIAGNOSIS — Z79899 Other long term (current) drug therapy: Secondary | ICD-10-CM | POA: Insufficient documentation

## 2020-04-05 DIAGNOSIS — K5732 Diverticulitis of large intestine without perforation or abscess without bleeding: Secondary | ICD-10-CM | POA: Insufficient documentation

## 2020-04-05 DIAGNOSIS — J449 Chronic obstructive pulmonary disease, unspecified: Secondary | ICD-10-CM | POA: Insufficient documentation

## 2020-04-05 LAB — CBC WITH DIFFERENTIAL/PLATELET
Abs Immature Granulocytes: 0.03 10*3/uL (ref 0.00–0.07)
Basophils Absolute: 0.1 10*3/uL (ref 0.0–0.1)
Basophils Relative: 1 %
Eosinophils Absolute: 0.2 10*3/uL (ref 0.0–0.5)
Eosinophils Relative: 2 %
HCT: 36.4 % (ref 36.0–46.0)
Hemoglobin: 11.3 g/dL — ABNORMAL LOW (ref 12.0–15.0)
Immature Granulocytes: 0 %
Lymphocytes Relative: 28 %
Lymphs Abs: 2.1 10*3/uL (ref 0.7–4.0)
MCH: 24.5 pg — ABNORMAL LOW (ref 26.0–34.0)
MCHC: 31 g/dL (ref 30.0–36.0)
MCV: 79 fL — ABNORMAL LOW (ref 80.0–100.0)
Monocytes Absolute: 0.7 10*3/uL (ref 0.1–1.0)
Monocytes Relative: 9 %
Neutro Abs: 4.6 10*3/uL (ref 1.7–7.7)
Neutrophils Relative %: 60 %
Platelets: 460 10*3/uL — ABNORMAL HIGH (ref 150–400)
RBC: 4.61 MIL/uL (ref 3.87–5.11)
RDW: 16.9 % — ABNORMAL HIGH (ref 11.5–15.5)
WBC: 7.6 10*3/uL (ref 4.0–10.5)
nRBC: 0 % (ref 0.0–0.2)

## 2020-04-05 LAB — COMPREHENSIVE METABOLIC PANEL
ALT: 9 U/L (ref 0–44)
AST: 14 U/L — ABNORMAL LOW (ref 15–41)
Albumin: 3.6 g/dL (ref 3.5–5.0)
Alkaline Phosphatase: 69 U/L (ref 38–126)
Anion gap: 9 (ref 5–15)
BUN: 10 mg/dL (ref 8–23)
CO2: 23 mmol/L (ref 22–32)
Calcium: 9 mg/dL (ref 8.9–10.3)
Chloride: 108 mmol/L (ref 98–111)
Creatinine, Ser: 0.75 mg/dL (ref 0.44–1.00)
GFR, Estimated: >60 mL/min (ref >60)
Glucose, Bld: 94 mg/dL (ref 70–99)
Potassium: 3.9 mmol/L (ref 3.5–5.1)
Sodium: 140 mmol/L (ref 135–145)
Total Bilirubin: 0.4 mg/dL (ref 0.3–1.2)
Total Protein: 7.3 g/dL (ref 6.5–8.1)

## 2020-04-05 LAB — URINALYSIS, ROUTINE W REFLEX MICROSCOPIC
Bilirubin Urine: NEGATIVE
Glucose, UA: NEGATIVE mg/dL
Hgb urine dipstick: NEGATIVE
Ketones, ur: 5 mg/dL — AB
Leukocytes,Ua: NEGATIVE
Nitrite: NEGATIVE
Protein, ur: NEGATIVE mg/dL
Specific Gravity, Urine: 1.026 (ref 1.005–1.030)
pH: 5 (ref 5.0–8.0)

## 2020-04-05 MED ORDER — AMOXICILLIN-POT CLAVULANATE 875-125 MG PO TABS: 1.0000 | ORAL_TABLET | Freq: Two times a day (BID) | ORAL | 0 refills | Status: DC

## 2020-04-05 MED ORDER — IOHEXOL 300 MG/ML  SOLN
100.0000 mL | Freq: Once | INTRAMUSCULAR | Status: AC | PRN
  Administered 2020-04-05: 100 mL via INTRAVENOUS

## 2020-04-05 MED ORDER — MORPHINE SULFATE (PF) 4 MG/ML IV SOLN
4.0000 mg | Freq: Once | INTRAVENOUS | Status: AC
  Administered 2020-04-05: 4 mg via INTRAVENOUS
  Filled 2020-04-05: qty 1

## 2020-04-05 NOTE — ED Provider Notes (Signed)
[facility] DEPT Provider Note   CSN: [id number] Arrival date & time: 04/05/20  1043     History Chief Complaint  Patient presents with  . Urinary Tract Infection    [name] is a 69 y.o. female.  Presents to ER with concern for abdominal pain.  Reports that over the last couple days she has noted pain in her lower abdomen, also having burning sensation while urinating.  Went to her primary doctor yesterday and was prescribed some medicine though she does not recall what this is or whether this was an antibiotic.  She reports they did not take a urine sample, but did perform an x-ray.  Pain is relatively constant, does seem to come and go though at random occasionally.  Lower abdomen, left and right.  No associated vaginal bleeding or vaginal discharge.  No nausea, vomiting, diarrhea or constipation.  Past surgical history notable for hysterectomy.  HPI     Past Medical History:  Diagnosis Date  . Anemia 12/26/2015  . Anxiety   . Arthritis   . Blood transfusion without reported diagnosis   . Chronic pain   . COPD (chronic obstructive pulmonary disease) ([place])   . Deep vein blood clot of right lower extremity ([place]) 12/29/2012   Dr. Nadyne Coombes, stent in place  . Depression   . Diabetes mellitus without complication ([place])   . GERD (gastroesophageal reflux disease)   . Heart murmur   . Hyperlipemia   . Hypertension   . Sleep apnea    uses a cpap  . Wears glasses     Patient Active Problem List   Diagnosis Date Noted  . Tobacco use disorder 09/18/2018  . Iron deficiency anemia 04/09/2018  . Symptomatic anemia 12/26/2015  . Hypokalemia 12/26/2015  . Type 2 diabetes mellitus with peripheral vascular disease ([place]) 12/26/2015  . Claudication in peripheral vascular disease ([place]) 10/05/2013  . Discomfort in chest 05/11/2012  . Nicotine dependence 05/11/2012  . Bradycardia 05/11/2012  . HTN (hypertension) 05/11/2012  . Back pain  05/11/2012    Past Surgical History:  Procedure Laterality Date  . ABDOMINAL HYSTERECTOMY    . CARDIAC CATHETERIZATION  4/15  . CATARACT EXTRACTION    . CESAREAN SECTION    . COLONOSCOPY N/A 01/08/2013   Procedure: COLONOSCOPY;  Surgeon: Beryle Beams, MD;  Location[facility] ENDOSCOPY;  Service: Endoscopy;  Laterality: N/A;  . COLONOSCOPY N/A 12/28/2015   Procedure: COLONOSCOPY;  Surgeon: Carol Ada, MD;  Location: [facility] ENDOSCOPY;  Service: Endoscopy;  Laterality: N/A;  . ENTEROSCOPY N/A 12/28/2015   Procedure: ENTEROSCOPY;  Surgeon: Carol Ada, MD;  Location: [place];  Service: Endoscopy;  Laterality: N/A;  . ENTEROSCOPY N/A 02/20/2018   Procedure: ENTEROSCOPY;  Surgeon: Carol Ada, MD;  Location: [facility] ENDOSCOPY;  Service: Endoscopy;  Laterality: N/A;  . ENTEROSCOPY N/A 03/27/2018   Procedure: ENTEROSCOPY;  Surgeon: Carol Ada, MD;  Location: [facility] ENDOSCOPY;  Service: Endoscopy;  Laterality: N/A;  . ESOPHAGOGASTRODUODENOSCOPY N/A 01/08/2013   Procedure: ESOPHAGOGASTRODUODENOSCOPY (EGD);  Surgeon: Beryle Beams, MD;  Location: [facility] ENDOSCOPY;  Service: Endoscopy;  Laterality: N/A;  . EYE SURGERY    . HAND SURGERY    . HOT HEMOSTASIS N/A 12/28/2015   Procedure: HOT HEMOSTASIS (ARGON PLASMA COAGULATION/BICAP);  Surgeon: Carol Ada, MD;  Location: [facility] ENDOSCOPY;  Service: Endoscopy;  Laterality: N/A;  . HOT HEMOSTASIS N/A 02/20/2018   Procedure: HOT HEMOSTASIS (ARGON PLASMA COAGULATION/BICAP);  Surgeon: Carol Ada, MD;  Location: [facility] ENDOSCOPY;  Service: Endoscopy;  Laterality: N/A;  . HOT HEMOSTASIS N/A 03/27/2018   Procedure: HOT HEMOSTASIS (ARGON PLASMA COAGULATION/BICAP);  Surgeon: Carol Ada, MD;  Location: [facility] ENDOSCOPY;  Service: Endoscopy;  Laterality: N/A;  . LEFT HEART CATHETERIZATION WITH CORONARY ANGIOGRAM N/A 03/09/2013   Procedure: LEFT HEART CATHETERIZATION WITH CORONARY ANGIOGRAM;  Surgeon: Laverda Page, MD;  Location: [facility] CATH LAB;  Service: Cardiovascular;  Laterality:  N/A;  . LOWER EXTREMITY ANGIOGRAM N/A 12/29/2012   Procedure: LOWER EXTREMITY ANGIOGRAM;  Surgeon: Laverda Page, MD;  Location: [facility] CATH LAB;  Service: Cardiovascular;  Laterality: N/A;  . LOWER EXTREMITY ANGIOGRAM N/A 10/05/2013   Procedure: LOWER EXTREMITY ANGIOGRAM;  Surgeon: Laverda Page, MD;  Location: [facility] CATH LAB;  Service: Cardiovascular;  Laterality: N/A;  . LOWER EXTREMITY ANGIOGRAPHY N/A 02/11/2017   Procedure: Lower Extremity Angiography;  Surgeon: Adrian Prows, MD;  Location: [place] CV LAB;  Service: Cardiovascular;  Laterality: N/A;  . PERIPHERAL VASCULAR BALLOON ANGIOPLASTY  02/11/2017   Procedure: PERIPHERAL VASCULAR BALLOON ANGIOPLASTY;  Surgeon: Adrian Prows, MD;  Location: [place] CV LAB;  Service: Cardiovascular;;  Right SFA  . PERIPHERAL VASCULAR INTERVENTION Right 02/11/2017   Procedure: PERIPHERAL VASCULAR INTERVENTION;  Surgeon: Adrian Prows, MD;  Location: [place] CV LAB;  Service: Cardiovascular;  Laterality: Right;  Rt SFA  . stent in right leg  12/29/2012   for blood clot, Dr. Nadyne Coombes  . TYMPANOMASTOIDECTOMY Left 03/08/2014   Procedure: TYMPANOMASTOIDECTOMY LEFT;  Surgeon: Ascencion Dike, MD;  Location: [place];  Service: ENT;  Laterality: Left;  . TYMPANOMASTOIDECTOMY  2015  . UTERINE FIBROID SURGERY       OB History   No obstetric history on file.     Family History  Problem Relation Age of Onset  . Diabetes Mother   . Hypertension Mother   . Heart disease Mother   . Heart disease Father   . Hypertension Father   . Hypertension Sister   . Hypertension Brother   . Diabetes Brother   . Hypertension Sister   . Hypertension Brother   . Diabetes Brother   . Hypertension Brother   . Hypertension Brother   . Hypertension Brother     Social History   Tobacco Use  . Smoking status: Current Every Day Smoker    Packs/day: 0.50    Years: 41.00    Pack years: 20.50    Types: Cigarettes  . Smokeless tobacco: Never Used  Vaping Use   . Vaping Use: Never used  Substance Use Topics  . Alcohol use: Yes    Comment: rarely   . Drug use: Yes    Types: Marijuana    Comment: last-02/24/14    Home Medications Prior to Admission medications   Medication Sig Start Date End Date Taking? Authorizing Provider  albuterol (PROVENTIL HFA;VENTOLIN HFA) 108 (90 Base) MCG/ACT inhaler Inhale 1-2 puffs into the lungs every 6 (six) hours as needed for wheezing or shortness of breath.   Yes [provider]  amLODipine (NORVASC) 10 MG tablet Take 10 mg by mouth daily.  07/06/19  Yes [provider]  aspirin EC 81 MG tablet Take 81 mg by mouth daily.    Yes [provider]  gabapentin (NEURONTIN) 600 MG tablet Take 1 tablet (600 mg total) by mouth at bedtime. Patient taking differently: Take 600 mg by mouth at bedtime as needed (pain).  10/02/17  Yes Hyatt, Max T, DPM  hydrALAZINE (APRESOLINE) 100 MG tablet TAKE 1 TABLET(100 MG) BY MOUTH  THREE TIMES DAILY Patient taking differently: Take 100 mg by mouth 3 (three) times daily.  08/06/19  Yes Miquel Dunn, NP  hydrOXYzine (VISTARIL) 50 MG capsule Take 50 mg by mouth at bedtime.   Yes [provider]  losartan (COZAAR) 100 MG tablet Take 100 mg by mouth daily. 03/16/20  Yes [provider]  metFORMIN (GLUCOPHAGE) 500 MG tablet Take 0.5 tablets (250 mg total) by mouth 2 (two) times daily with a meal. 02/13/17  Yes Adrian Prows, MD  spironolactone (ALDACTONE) 50 MG tablet TAKE 1 TABLET(50 MG) BY MOUTH DAILY Patient taking differently: Take 50 mg by mouth daily.  03/13/20  Yes Adrian Prows, MD  Vitamin D, Ergocalciferol, (DRISDOL) 1.25 MG (50000 UT) CAPS capsule Take 50,000 Units by mouth once a week. 05/26/19  Yes [provider]  meloxicam (MOBIC) 7.5 MG tablet Take 2 tablets (15 mg total) by mouth daily. Patient not taking: Reported on 03/01/2020 06/05/19   Margarita Mail, PA-C  nitroGLYCERIN (NITROSTAT) 0.4 MG SL tablet Place 1 tablet (0.4 mg  total) under the tongue every 5 (five) minutes as needed for chest pain. 01/13/19 04/05/20  Miquel Dunn, NP  traMADol (ULTRAM) 50 MG tablet Take 1 tablet (50 mg total) by mouth every 12 (twelve) hours as needed. Patient not taking: Reported on 03/01/2020 08/16/19   Meredith Pel, MD    Allergies    Patient has no known allergies.  Review of Systems   Review of Systems  Constitutional: Negative for chills and fever.  HENT: Negative for ear pain and sore throat.   Eyes: Negative for pain and visual disturbance.  Respiratory: Negative for cough and shortness of breath.   Cardiovascular: Negative for chest pain and palpitations.  Gastrointestinal: Positive for abdominal pain. Negative for vomiting.  Genitourinary: Positive for dysuria. Negative for hematuria.  Musculoskeletal: Negative for arthralgias and back pain.  Skin: Negative for color change and rash.  Neurological: Negative for seizures and syncope.  All other systems reviewed and are negative.   Physical Exam Updated Vital Signs BP (!) 172/93   Pulse (!) 50   Temp 98.8 F (37.1 C) (Oral)   Resp 16   Ht 5' (1.524 m)   Wt 92.5 kg   SpO2 96%   BMI 39.84 kg/m   Physical Exam Vitals and nursing note reviewed.  Constitutional:      General: She is not in acute distress.    Appearance: She is well-developed.  HENT:     Head: Normocephalic and atraumatic.  Eyes:     Conjunctiva/sclera: Conjunctivae normal.  Cardiovascular:     Rate and Rhythm: Normal rate and regular rhythm.     Heart sounds: No murmur heard.   Pulmonary:     Effort: Pulmonary effort is normal. No respiratory distress.     Breath sounds: Normal breath sounds.  Abdominal:     Palpations: Abdomen is soft.     Tenderness: There is abdominal tenderness.     Comments: Tenderness to palpation noted in left lower quadrant, no rebound or guarding.  Musculoskeletal:     Cervical back: Neck supple.  Skin:    General: Skin is warm and dry.   Neurological:     Mental Status: She is alert.     ED Results / Procedures / Treatments   Labs (all labs ordered are listed, but only abnormal results are displayed) Labs Reviewed  URINALYSIS, ROUTINE W REFLEX MICROSCOPIC - Abnormal; Notable for the following components:  Result Value   APPearance HAZY (*)    Ketones, ur 5 (*)    All other components within normal limits  CBC WITH DIFFERENTIAL/PLATELET - Abnormal; Notable for the following components:   Hemoglobin 11.3 (*)    MCV 79.0 (*)    MCH 24.5 (*)    RDW 16.9 (*)    Platelets 460 (*)    All other components within normal limits  COMPREHENSIVE METABOLIC PANEL - Abnormal; Notable for the following components:   AST 14 (*)    All other components within normal limits    EKG None  Radiology No results found.  Procedures Procedures (including critical care time)  Medications Ordered in ED Medications  morphine 4 MG/ML injection 4 mg (4 mg Intravenous Given 04/05/20 1119)  iohexol (OMNIPAQUE) 300 MG/ML solution 100 mL (100 mLs Intravenous Contrast Given 04/05/20 1316)    ED Course  I have reviewed the triage vital signs and the nursing notes.  Pertinent labs & imaging results that were available during my care of the patient were reviewed by me and considered in my medical decision making (see chart for details).  Clinical Course as of Apr 05 1414  Wed Apr 05, 2020  1414 Diverticulitis noted on CT, will update patient and discharged with Augmentin   [RD]    Clinical Course User Index [RD] Lucrezia Starch, MD   MDM Rules/Calculators/A&P                         69 year old lady presents to ER with concern for abdominal pain, dysuria.  She is well-appearing in no distress, normal vital signs.  Labs grossly stable, UA negative for infection.  CT scan demonstrating mild sigmoid diverticulitis.  This fits with her symptoms and exam.  Will start on course of antibiotics.  Recommend recheck with primary  doctor, return for worsening symptoms.    After the discussed management above, the patient was determined to be safe for discharge.  The patient was in agreement with this plan and all questions regarding their care were answered.  ED return precautions were discussed and the patient will return to the ED with any significant worsening of condition.    Final Clinical Impression(s) / ED Diagnoses Final diagnoses:  Diverticulitis    Rx / DC Orders ED Discharge Orders    None       Lucrezia Starch, MD 04/05/20 1415

## 2020-04-05 NOTE — Discharge Instructions (Signed)
Follow-up with your primary doctor for recheck regarding your diverticulitis.  If you have any increase in your pain, vomiting, fever or other new concerning symptom, return to ER for reassessment.  Please take antibiotics as prescribed.

## 2020-04-05 NOTE — ED Triage Notes (Signed)
Pt having painful urination x 2 days, went ot PCP yesterday and started on meds. Pain continues

## 2020-04-11 ENCOUNTER — Encounter (HOSPITAL_COMMUNITY): Payer: Self-pay

## 2020-04-11 NOTE — Progress Notes (Signed)
PCP - Dr Iona Beard [place] Cardiologist - Dr Einar Gip  Chest x-ray - 07/04/19 (2V) EKG - 03/01/20 Stress Test - 05/19/17 ECHO - 02/17/19 Cardiac Cath - 03/09/13  Sleep Study -  Yes CPAP - does not use CPAP  Fasting Blood Sugar - 80s-90s Checks Blood Sugar 1 times a day  . Do not take oral diabetes medicines (Metformin) the morning of surgery.  . If your blood sugar is less than 70 mg/dL, you will need to treat for low blood sugar: o Treat a low blood sugar (less than 70 mg/dL) with  cup of clear juice (cranberry or apple), 4 glucose tablets, OR glucose gel. o Recheck blood sugar in 15 minutes after treatment (to make sure it is greater than 70 mg/dL). If your blood sugar is not greater than 70 mg/dL on recheck, call [phone] for further instructions.  ERAS: Clears til 4:30 DOS, G2 drink given at PAT appt.  Anesthesia review: Yes  Coronavirus Screening Covid test scheduled on 04/14/20 Do you have any of the following symptoms:  Cough yes/no: No Fever (>100.53F)  yes/no: No Runny nose yes/no: No Sore throat yes/no: No Difficulty breathing/shortness of breath  yes/no: No  Have you traveled in the last 14 days and where? yes/no: No  Patient verbalized understanding of instructions that were given to them at the PAT appointment.

## 2020-04-11 NOTE — Progress Notes (Addendum)
PHARMACARE AT [facility], [place] - [address] [place] [address] Phone: [phone] Fax: [phone]  [id number] - [place], [place] - [address] AT [address] [place] [address] Phone: [phone] Fax: [phone]      Your procedure is scheduled on Tuesday, 04/18/20.  Report to [facility] Main Entrance "A" at 5:30 A.M., and check in at the Admitting office.  Call this number if you have problems the morning of surgery:  [phone]  Call [phone] if you have any questions prior to your surgery date Monday-Friday 8am-4pm    Remember:  Do not eat after midnight the night before your surgery - Monday  You may drink clear liquids until 4:30 am Clear liquids allowed are: Water, Non-Citrus Juices (without pulp), Carbonated Beverages, Clear Tea, Black Coffee Only, and Gatorade  Please complete your PRE-SURGERY G2/Water that was provided to you by 4:30 am. the morning of surgery.  Please, if able, drink it in one setting. DO NOT SIP. the morning of your surgery.      Take these medicines the morning of surgery with A SIP OF WATER: amLODipine (NORVASC) amoxicillin-clavulanate (AUGMENTIN) hydrALAZINE (APRESOLINE)  nitroGLYCERIN if needed  albuterol inhaler - ok to use - bring  As of today, STOP taking any Aspirin (unless otherwise instructed by your surgeon) Aleve, Naproxen, Ibuprofen, Motrin, Advil, Goody's, BC's, all herbal medications, fish oil, and all vitamins.                      Do not wear jewelry, make up, or nail polish            Do not wear lotions, powders, perfumes, or deodorant.            Do not shave 48 hours prior to surgery.              Do not bring valuables to the hospital.            [facility] is not responsible for any belongings or valuables.  Do NOT Smoke (Tobacco/Vaping) or drink Alcohol 24 hours prior to your procedure If you  use a CPAP at night, you may bring all equipment for your overnight stay.   Contacts, glasses, dentures or bridgework may not be worn into surgery.      For patients admitted to the hospital, discharge time will be determined by your treatment team.   Patients discharged the day of surgery will not be allowed to drive home, and someone needs to stay with them for 24 hours.    Special instructions:   [place]- Preparing For Surgery  Before surgery, you can play an important role. Because skin is not sterile, your skin needs to be as free of germs as possible. You can reduce the number of germs on your skin by washing with CHG (chlorahexidine gluconate) Soap before surgery.  CHG is an antiseptic cleaner which kills germs and bonds with the skin to continue killing germs even after washing.    Oral Hygiene is also important to reduce your risk of infection.  Remember - BRUSH YOUR TEETH THE MORNING OF SURGERY WITH YOUR REGULAR TOOTHPASTE  Please do not use if you have an allergy to CHG or antibacterial soaps. If your skin becomes reddened/irritated stop using the CHG.  Do not shave (including legs and underarms) for at least 48 hours prior to first  CHG shower. It is OK to shave your face.  Please follow these instructions carefully.   1. Shower the NIGHT BEFORE SURGERY-Mon and the MORNING OF SURGERY-Tues with CHG Soap.   2. If you chose to wash your hair, wash your hair first as usual with your normal shampoo.  3. After you shampoo, rinse your hair and body thoroughly to remove the shampoo.  4. Use CHG as you would any other liquid soap. You can apply CHG directly to the skin and wash gently with a scrungie or a clean washcloth.   5. Apply the CHG Soap to your body ONLY FROM THE NECK DOWN.  Do not use on open wounds or open sores. Avoid contact with your eyes, ears, mouth and genitals (private parts). Wash Face and genitals (private parts)  with your normal soap.   6. Wash thoroughly,  paying special attention to the area where your surgery will be performed.  7. Thoroughly rinse your body with warm water from the neck down.  8. DO NOT shower/wash with your normal soap after using and rinsing off the CHG Soap.  9. Pat yourself dry with a CLEAN TOWEL.  10. Wear CLEAN PAJAMAS to bed the night before surgery  11. Place CLEAN SHEETS on your bed the night of your first shower and DO NOT SLEEP WITH PETS.   Day of Surgery: Wear Clean/Comfortable clothing the morning of surgery Do not apply any deodorants/lotions.   Remember to brush your teeth WITH YOUR REGULAR TOOTHPASTE.   Please read over the following fact sheets that you were given.

## 2020-04-12 ENCOUNTER — Encounter (HOSPITAL_COMMUNITY)
Admission: RE | Admit: 2020-04-12 | Discharge: 2020-04-12 | Disposition: A | Discharge status: Home / Self Care | Payer: Medicare Other | Source: Ambulatory Visit | Attending: Orthopedic Surgery | Admitting: Orthopedic Surgery

## 2020-04-12 ENCOUNTER — Encounter (HOSPITAL_COMMUNITY): Payer: Self-pay

## 2020-04-12 ENCOUNTER — Other Ambulatory Visit: Payer: Self-pay

## 2020-04-12 DIAGNOSIS — Z79899 Other long term (current) drug therapy: Secondary | ICD-10-CM | POA: Insufficient documentation

## 2020-04-12 DIAGNOSIS — Z86718 Personal history of other venous thrombosis and embolism: Secondary | ICD-10-CM | POA: Insufficient documentation

## 2020-04-12 DIAGNOSIS — I739 Peripheral vascular disease, unspecified: Secondary | ICD-10-CM | POA: Diagnosis not present

## 2020-04-12 DIAGNOSIS — Z9119 Patient's noncompliance with other medical treatment and regimen: Secondary | ICD-10-CM | POA: Diagnosis not present

## 2020-04-12 DIAGNOSIS — F17210 Nicotine dependence, cigarettes, uncomplicated: Secondary | ICD-10-CM | POA: Insufficient documentation

## 2020-04-12 DIAGNOSIS — F419 Anxiety disorder, unspecified: Secondary | ICD-10-CM | POA: Diagnosis not present

## 2020-04-12 DIAGNOSIS — G4733 Obstructive sleep apnea (adult) (pediatric): Secondary | ICD-10-CM | POA: Diagnosis not present

## 2020-04-12 DIAGNOSIS — E119 Type 2 diabetes mellitus without complications: Secondary | ICD-10-CM | POA: Insufficient documentation

## 2020-04-12 DIAGNOSIS — Z791 Long term (current) use of non-steroidal anti-inflammatories (NSAID): Secondary | ICD-10-CM | POA: Insufficient documentation

## 2020-04-12 DIAGNOSIS — I10 Essential (primary) hypertension: Secondary | ICD-10-CM | POA: Insufficient documentation

## 2020-04-12 DIAGNOSIS — Z01812 Encounter for preprocedural laboratory examination: Secondary | ICD-10-CM | POA: Insufficient documentation

## 2020-04-12 DIAGNOSIS — E669 Obesity, unspecified: Secondary | ICD-10-CM | POA: Diagnosis not present

## 2020-04-12 DIAGNOSIS — M1712 Unilateral primary osteoarthritis, left knee: Secondary | ICD-10-CM | POA: Diagnosis not present

## 2020-04-12 DIAGNOSIS — Z6839 Body mass index (BMI) 39.0-39.9, adult: Secondary | ICD-10-CM | POA: Insufficient documentation

## 2020-04-12 DIAGNOSIS — E785 Hyperlipidemia, unspecified: Secondary | ICD-10-CM | POA: Diagnosis not present

## 2020-04-12 DIAGNOSIS — Z7982 Long term (current) use of aspirin: Secondary | ICD-10-CM | POA: Diagnosis not present

## 2020-04-12 DIAGNOSIS — F329 Major depressive disorder, single episode, unspecified: Secondary | ICD-10-CM | POA: Insufficient documentation

## 2020-04-12 DIAGNOSIS — Z7901 Long term (current) use of anticoagulants: Secondary | ICD-10-CM | POA: Diagnosis not present

## 2020-04-12 DIAGNOSIS — Z7902 Long term (current) use of antithrombotics/antiplatelets: Secondary | ICD-10-CM | POA: Diagnosis not present

## 2020-04-12 DIAGNOSIS — J449 Chronic obstructive pulmonary disease, unspecified: Secondary | ICD-10-CM | POA: Diagnosis not present

## 2020-04-12 HISTORY — DX: Atherosclerotic heart disease of native coronary artery without angina pectoris: I25.10

## 2020-04-12 HISTORY — DX: Peripheral vascular disease, unspecified: I73.9

## 2020-04-12 LAB — URINALYSIS, ROUTINE W REFLEX MICROSCOPIC
Bilirubin Urine: NEGATIVE
Glucose, UA: NEGATIVE mg/dL
Hgb urine dipstick: NEGATIVE
Ketones, ur: 5 mg/dL — AB
Leukocytes,Ua: NEGATIVE
Nitrite: NEGATIVE
Protein, ur: NEGATIVE mg/dL
Specific Gravity, Urine: 1.023 (ref 1.005–1.030)
pH: 5 (ref 5.0–8.0)

## 2020-04-12 LAB — CBC
HCT: 41 % (ref 36.0–46.0)
Hemoglobin: 12.2 g/dL (ref 12.0–15.0)
MCH: 23.6 pg — ABNORMAL LOW (ref 26.0–34.0)
MCHC: 29.8 g/dL — ABNORMAL LOW (ref 30.0–36.0)
MCV: 79.5 fL — ABNORMAL LOW (ref 80.0–100.0)
Platelets: 527 10*3/uL — ABNORMAL HIGH (ref 150–400)
RBC: 5.16 MIL/uL — ABNORMAL HIGH (ref 3.87–5.11)
RDW: 16.6 % — ABNORMAL HIGH (ref 11.5–15.5)
WBC: 7.7 10*3/uL (ref 4.0–10.5)
nRBC: 0 % (ref 0.0–0.2)

## 2020-04-12 LAB — BASIC METABOLIC PANEL
Anion gap: 8 (ref 5–15)
BUN: 10 mg/dL (ref 8–23)
CO2: 27 mmol/L (ref 22–32)
Calcium: 9.7 mg/dL (ref 8.9–10.3)
Chloride: 105 mmol/L (ref 98–111)
Creatinine, Ser: 0.73 mg/dL (ref 0.44–1.00)
GFR, Estimated: >60 mL/min (ref >60)
Glucose, Bld: 118 mg/dL — ABNORMAL HIGH (ref 70–99)
Potassium: 3.8 mmol/L (ref 3.5–5.1)
Sodium: 140 mmol/L (ref 135–145)

## 2020-04-12 LAB — SURGICAL PCR SCREEN
MRSA, PCR: NEGATIVE
Staphylococcus aureus: NEGATIVE

## 2020-04-12 LAB — GLUCOSE, CAPILLARY: Glucose-Capillary: 111 mg/dL — ABNORMAL HIGH (ref 70–99)

## 2020-04-12 NOTE — Progress Notes (Signed)
[facility] [phone] inquiring about HgbA1C results.  Lab tech told me that the A1C machine is down.  The lab had sent sample out to [facility] for analysis.  Results will be back in 1-2 days.

## 2020-04-13 ENCOUNTER — Encounter (HOSPITAL_COMMUNITY): Payer: Self-pay

## 2020-04-13 LAB — URINE CULTURE: Culture: NO GROWTH

## 2020-04-13 LAB — HEMOGLOBIN A1C
Hgb A1c MFr Bld: 6.3 % — ABNORMAL HIGH (ref 4.8–5.6)
Mean Plasma Glucose: 134 mg/dL

## 2020-04-13 NOTE — Progress Notes (Addendum)
Anesthesia Chart Review:  Case: [id number] Date/Time: 04/18/20 0715   Procedure: LEFT TOTAL KNEE ARTHROPLASTY (Left Knee)   Anesthesia type: Spinal   Pre-op diagnosis: left knee osteoarthritis   Location: [facility] OR ROOM 06 / [facility]   Surgeons: Meredith Pel, MD      DISCUSSION: Patient is a 69 year old female scheduled for the above procedure.  History includes smoking, HTN, HLD, CAD (~ 60% RCA, ~ 60% LAD but insignificant by FFR, medical therapy 03/09/13), GERD, COPD, anemia, DM2, murmur (mild AS, mild TR 01/2019 echo), OSA (does not use CPAP), PAD (DES right SFA 02/11/17), left ear cholesteatoma (s/p tympanomastoidectomy 03/08/14), GI bleed (in setting of Plavix and duodenal AVMs 12/2012; 12/2015; 03/2018). BMI is consistent with obesity.  - ED visit 04/05/20 for abdominal pain with dysuria. Patient thought she may have a UTI, but UA negative for leukocytes and nitrites. Abdominal CT showed probable mild distal sigmoid diverticulitis without abscess. She was discharged home on Augmentin X 7 days. WBC was WNL. (Notified Debbie at Dr. Lorin Mercy' office, although patient should now have completed course of antibiotics. Preoperative urine culture was negative.)  Last evaluation by cardiologist Dr. Terri Skains was on 03/01/20. Continued medical therapy for CAD recommended. Awaiting perioperative input.   Preoperative COVID-19 test is scheduled for 04/14/2020.  ADDENDUM 04/17/20 5:00 PM: COVID-19 test negative 04/14/20.   Preoperative cardiac risk assessment outlined by Dr. Terri Skains this afternoon include: ".Marland KitchenMarland KitchenPatient has multiple nonmodifiable risk factors as noted above. She presents moderate cardiac risk for the planned noncardiac surgery.  I do not believe that this is modifiable at this time.  I have also explained this to the patient and she understands and tells me that she is willing to accept this risk.  We will be available if needed.  Please call if any questions arise.  Given her underlying mild aortic  stenosis would recommend avoiding hypotension and keeping her euvolemic..."   VS: BP 129/75   Pulse 62   Temp 36.6 C (Temporal)   Resp 18   Ht 5' (1.524 m)   Wt 92.1 kg   LMP  (LMP Unknown)   SpO2 99%   BMI 39.65 kg/m    PROVIDERS: Benito Mccreedy, MD is PCP  Rex Kras, DO is cardiologist (established 08/30/2019). Previously was seeing Adrian Prows, MD at the same practice.   LABS: Labs reviewed: Acceptable for surgery. 04/12/20 urine culture showed no growth. (all labs ordered are listed, but only abnormal results are displayed)  Labs Reviewed  GLUCOSE, CAPILLARY - Abnormal; Notable for the following components:      Result Value   Glucose-Capillary 111 (*)    All other components within normal limits  HEMOGLOBIN A1C - Abnormal; Notable for the following components:   Hgb A1c MFr Bld 6.3 (*)    All other components within normal limits  BASIC METABOLIC PANEL - Abnormal; Notable for the following components:   Glucose, Bld 118 (*)    All other components within normal limits  CBC - Abnormal; Notable for the following components:   RBC 5.16 (*)    MCV 79.5 (*)    MCH 23.6 (*)    MCHC 29.8 (*)    RDW 16.6 (*)    Platelets 527 (*)    All other components within normal limits  URINALYSIS, ROUTINE W REFLEX MICROSCOPIC - Abnormal; Notable for the following components:   APPearance HAZY (*)    Ketones, ur 5 (*)    All other components within normal limits  URINE CULTURE  SURGICAL PCR SCREEN     IMAGES: CT Abd/pelvis 04/05/2020: IMPRESSION: Probable mild distal sigmoid diverticulitis is noted without abscess formation. Aortic Atherosclerosis (ICD10-I70.0).  CXR 07/04/2019: IMPRESSION: Mild scarring left mid and lower lung zones. Lungs elsewhere clear. Stable cardiac silhouette. No adenopathy. Aortic Atherosclerosis (ICD10-I70.0).  MRI Left knee 08/06/2019: IMPRESSION: 1. Radial tear at the medial meniscus body/posterior horn junction. 2. Peripheral  longitudinal tear of the lateral meniscus posterior horn. 3. Tricompartmental osteoarthritis, mild-to-moderate in the medial and patellofemoral compartments. 4. Old proximal MCL avulsion injury. 5. Small loculated Baker cyst.    EKG: 03/01/2020:Sinus  Rhythm, 63bpm, normal axis, PRWP, without underlying ischemia or injury pattern. Frequent PACs.    CV: Echocardiogram 02/18/2019:  Left ventricle cavity is normal in size. Mild concentric hypertrophy of  the left ventricle. Normal LV systolic function with EF 68%. Normal global  wall motion. Doppler evidence of grade I (impaired) diastolic dysfunction,  normal LAP. Calculated EF 68%.  Trileaflet aortic valve with mild calcification. Mild aortic valve  stenosis. Aortic valve mean gradient of 12 mmHg, Vmax of 2.4 m/s.  Calculated aortic valve area by continuity equation is 1.4 cm. No  regurgitation.  Trace mitral regurgitation. Trace pulmonic regurgitation.  Mild tricuspid regurgitation. Estimated pulmonary artery systolic pressure  is 32 mmHg.    Lower extremity arterial duplex 04/15/2018 (as outlined in 03/01/20 note by Dr. Terri Skains): No hemodynamically significant stenoses are identified in the lower extremity arterial system. Mild diffuse calcific plaque noted. This exam reveals mildly decreased perfusion of the right lower extremity, noted at the anterior tibial artery level (ABI 0.86) and normal perfusion of the left lower extremity (ABI 0.97). No signficant change since 05/28/2017. Study suggests patent right SFA angioplasty site   Exercise myoview stress 05/19/2017 (as outlined in 03/01/20 note by Dr. Terri Skains):  1. Patient achieved 4.4 METS and reached 83% target heart rate. Exercise capacity low for age. Appropriate heart rate and blood pressure response. Stress symptoms included dyspnea, dizziness. Submaximal exercise stress study. 2. No stress EKG changes suggestive of ischemia. 3. Stress and rest SPECT images demonstrate  homogeneous tracer distribution throughout the myocardium. Gated SPECT imaging reveals normal myocardial thickening and wall motion. The left ventricular ejection fraction was normal (65%).  4. This is a low risk submaximal exercise stress study   Carotid artery duplex 07/25/2015 (as outlined in 03/01/20 note by Dr. Terri Skains): Antegrade right vertebral artery flow. Antegrade left vertebral artery flow. No hemodynamically significant arterial disease in the internal carotid artery bilaterally. No significant change from 11/26/2012.   Cardiac catheterization 03/09/2013: Hemodynamic data: Aortic pressure 116/61 mean 78 mm Hg. Right coronary artery: The vessel is  Dominant. There is mild to moderate diffuse luminal irregularity, midsegment of the right coronary artery has a 60 to at most 70% stenosis. Mild calcification is evident total proximal RCA. Left main coronary artery is large and normal. Circumflex coronary artery: A large vessel giving origin to a large high obtuse marginal 1. It is smooth and has mild luminal irregularity.  LAD:  LAD gives origin to a large diagonal-1 and D2. At the origin of the D2, the LAD showed a 60-70% stenosis. The rest of the LAD has mild luminal irregularities.  Ascending aortogram: The ascending aorta shows moderate amount of calcification and the root appears to be normal sized although the ascending aorta appears to show mild ectasia. The aortic valve is calcified and I could not easily cross the valve, bicuspid aortic valve in the excluded. There is  no significant regurgitation. FFR to the mid LAD: FFR was performed using Primewire in the usual fashion, using confined to correlation and adenosine for the IV infusion. There was no baseline pressure gradient across the stenosis, with intravenous adenosine infusion in the standard fashion, the FFR dropped to 0.86, the stenosis was felt to be insignificant and left alone. Impression: Moderate disease involving the mid  LAD of 60%, moderate disease of the mid RCA 60%, moderate calcification of the ascending aorta with mild ascending aortic ectasia, mild to moderate aortic valve calcification, cannot exclude bicuspid aortic valve. I did not cross the valve. This can be further evaluated by careful TTE.    Past Medical History:  Diagnosis Date  . Anemia 12/26/2015  . Anxiety   . Arthritis   . Blood transfusion without reported diagnosis   . Chronic pain    resolved per pt 04/12/20  . COPD (chronic obstructive pulmonary disease) ([place])   . Deep vein blood clot of right lower extremity ([place]) 12/29/2012   Dr. Nadyne Coombes, stent in place  . Depression   . Diabetes mellitus without complication ([place])    type 2  . GERD (gastroesophageal reflux disease)   . Heart murmur    since birth   . Hyperlipemia   . Hypertension   . Sleep apnea    does not use cpap  . Wears glasses     Past Surgical History:  Procedure Laterality Date  . ABDOMINAL HYSTERECTOMY    . CARDIAC CATHETERIZATION    . CATARACT EXTRACTION    . CESAREAN SECTION    . COLONOSCOPY N/A 01/08/2013   Procedure: COLONOSCOPY;  Surgeon: Beryle Beams, MD;  Location: [facility] ENDOSCOPY;  Service: Endoscopy;  Laterality: N/A;  . COLONOSCOPY N/A 12/28/2015   Procedure: COLONOSCOPY;  Surgeon: Carol Ada, MD;  Location: [facility] ENDOSCOPY;  Service: Endoscopy;  Laterality: N/A;  . ENTEROSCOPY N/A 12/28/2015   Procedure: ENTEROSCOPY;  Surgeon: Carol Ada, MD;  Location: [facility];  Service: Endoscopy;  Laterality: N/A;  . ENTEROSCOPY N/A 02/20/2018   Procedure: ENTEROSCOPY;  Surgeon: Carol Ada, MD;  Location: [facility] ENDOSCOPY;  Service: Endoscopy;  Laterality: N/A;  . ENTEROSCOPY N/A 03/27/2018   Procedure: ENTEROSCOPY;  Surgeon: Carol Ada, MD;  Location: [facility] ENDOSCOPY;  Service: Endoscopy;  Laterality: N/A;  . ESOPHAGOGASTRODUODENOSCOPY N/A 01/08/2013   Procedure: ESOPHAGOGASTRODUODENOSCOPY (EGD);  Surgeon: Beryle Beams, MD;  Location: [facility] ENDOSCOPY;  Service:  Endoscopy;  Laterality: N/A;  . EYE SURGERY    . HAND SURGERY    . HOT HEMOSTASIS N/A 12/28/2015   Procedure: HOT HEMOSTASIS (ARGON PLASMA COAGULATION/BICAP);  Surgeon: Carol Ada, MD;  Location: [facility] ENDOSCOPY;  Service: Endoscopy;  Laterality: N/A;  . HOT HEMOSTASIS N/A 02/20/2018   Procedure: HOT HEMOSTASIS (ARGON PLASMA COAGULATION/BICAP);  Surgeon: Carol Ada, MD;  Location: [facility] ENDOSCOPY;  Service: Endoscopy;  Laterality: N/A;  . HOT HEMOSTASIS N/A 03/27/2018   Procedure: HOT HEMOSTASIS (ARGON PLASMA COAGULATION/BICAP);  Surgeon: Carol Ada, MD;  Location: [facility] ENDOSCOPY;  Service: Endoscopy;  Laterality: N/A;  . LEFT HEART CATHETERIZATION WITH CORONARY ANGIOGRAM N/A 03/09/2013   Procedure: LEFT HEART CATHETERIZATION WITH CORONARY ANGIOGRAM;  Surgeon: Laverda Page, MD;  Location: [facility] CATH LAB;  Service: Cardiovascular;  Laterality: N/A;  . LOWER EXTREMITY ANGIOGRAM N/A 12/29/2012   Procedure: LOWER EXTREMITY ANGIOGRAM;  Surgeon: Laverda Page, MD;  Location: [facility] CATH LAB;  Service: Cardiovascular;  Laterality: N/A;  . LOWER EXTREMITY ANGIOGRAM N/A 10/05/2013   Procedure: LOWER EXTREMITY ANGIOGRAM;  Surgeon: Cammy Brochure  Carlynn Herald, MD;  Location: [facility] CATH LAB;  Service: Cardiovascular;  Laterality: N/A;  . LOWER EXTREMITY ANGIOGRAPHY N/A 02/11/2017   Procedure: Lower Extremity Angiography;  Surgeon: Adrian Prows, MD;  Location: [facility] CV LAB;  Service: Cardiovascular;  Laterality: N/A;  . PERIPHERAL VASCULAR BALLOON ANGIOPLASTY  02/11/2017   Procedure: PERIPHERAL VASCULAR BALLOON ANGIOPLASTY;  Surgeon: Adrian Prows, MD;  Location: [place] [facility] CV LAB;  Service: Cardiovascular;;  Right SFA  . PERIPHERAL VASCULAR INTERVENTION Right 02/11/2017   Procedure: PERIPHERAL VASCULAR INTERVENTION;  Surgeon: Adrian Prows, MD;  Location: [facility] CV LAB;  Service: Cardiovascular;  Laterality: Right;  Rt SFA  . stent in right leg  12/29/2012   for blood clot, Dr. Nadyne Coombes  . TYMPANOMASTOIDECTOMY Left 03/08/2014    Procedure: TYMPANOMASTOIDECTOMY LEFT;  Surgeon: Ascencion Dike, MD;  Location: [facility];  Service: ENT;  Laterality: Left;  . TYMPANOMASTOIDECTOMY  2015  . UTERINE FIBROID SURGERY      MEDICATIONS: . amoxicillin-clavulanate (AUGMENTIN) 875-125 MG tablet  . albuterol (PROVENTIL HFA;VENTOLIN HFA) 108 (90 Base) MCG/ACT inhaler  . amLODipine (NORVASC) 10 MG tablet  . aspirin EC 81 MG tablet  . gabapentin (NEURONTIN) 600 MG tablet  . hydrALAZINE (APRESOLINE) 100 MG tablet  . hydrOXYzine (VISTARIL) 50 MG capsule  . losartan (COZAAR) 100 MG tablet  . meloxicam (MOBIC) 7.5 MG tablet  . metFORMIN (GLUCOPHAGE) 500 MG tablet  . nitroGLYCERIN (NITROSTAT) 0.4 MG SL tablet  . spironolactone (ALDACTONE) 50 MG tablet  . traMADol (ULTRAM) 50 MG tablet  . Vitamin D, Ergocalciferol, (DRISDOL) 1.25 MG (50000 UT) CAPS capsule   No current facility-administered medications for this encounter.  She is not currently taking aspirin, meloxicam, tramadol.   Myra Gianotti, PA-C Surgical Short Stay/Anesthesiology [facility] Phone [phone] [facility] Phone [phone] 04/13/2020 4:17 PM

## 2020-04-14 ENCOUNTER — Other Ambulatory Visit (HOSPITAL_COMMUNITY)
Admission: RE | Admit: 2020-04-14 | Discharge: 2020-04-14 | Disposition: A | Discharge status: Home / Self Care | Payer: Medicare Other | Source: Ambulatory Visit | Attending: Orthopedic Surgery | Admitting: Orthopedic Surgery

## 2020-04-14 DIAGNOSIS — Z20822 Contact with and (suspected) exposure to covid-19: Secondary | ICD-10-CM | POA: Insufficient documentation

## 2020-04-14 DIAGNOSIS — Z01812 Encounter for preprocedural laboratory examination: Secondary | ICD-10-CM | POA: Diagnosis not present

## 2020-04-14 LAB — SARS CORONAVIRUS 2 (TAT 6-24 HRS): SARS Coronavirus 2: NEGATIVE

## 2020-04-17 ENCOUNTER — Telehealth: Payer: Self-pay | Admitting: *Deleted

## 2020-04-17 ENCOUNTER — Telehealth: Payer: Self-pay | Admitting: Cardiology

## 2020-04-17 DIAGNOSIS — Z01810 Encounter for preprocedural cardiovascular examination: Secondary | ICD-10-CM | POA: Diagnosis not present

## 2020-04-17 NOTE — Care Plan (Signed)
RNCM call to patient to discuss her upcoming left total knee arthroplasty with Dr. Marlou Sa. She is an Ortho bundle patient through [facility]. She is agreeable to case management. She has grandchildren that will be assisting her after discharge home. She will need a CPM, FWW, and 3in1 after surgery. Referral made to [facility]. They have attempted to contact her before surgery, but she did not return their calls. Will attempt to set up with all DME in hospital before discharge per [employer] rep. Anticipate HHPT after discharge. Choice provided and referral made to [facility]. Will continue to follow for needs.

## 2020-04-17 NOTE — Anesthesia Preprocedure Evaluation (Addendum)
Anesthesia Evaluation  Patient identified by MRN, date of birth, ID band Patient awake    Reviewed: Allergy & Precautions, NPO status , Patient's Chart, lab work & pertinent test results  History of Anesthesia Complications Negative for: history of anesthetic complications  Airway Mallampati: III  TM Distance: >3 FB Neck ROM: Full    Dental  (+) Dental Advisory Given,    Pulmonary sleep apnea , COPD, Current Smoker and Patient abstained from smoking.,  Covid-19 Nucleic Acid Test Results Lab Results      Component                Value               Date                      [place]              NEGATIVE            04/14/2020                [place]              NEGATIVE            07/04/2019              breath sounds clear to auscultation       Cardiovascular hypertension, Pt. on medications + Peripheral Vascular Disease   Rhythm:Regular  Left ventricle: The cavity size was normal. Wall thickness  was normal. Systolic function was normal. The estimated  ejection fraction was in the range of 55% to 65%. Wall  motion was normal; there were no regional wall motion  abnormalities. Left ventricular diastolic function  parameters were normal. Tissue doppler is in the  indeterminant range to assess LA pressure.  - Aortic valve: Trileaflet; mildly thickened, mildly  calcified leaflets. Sclerosis without stenosis.  - Mitral valve: Mildly calcified annulus. Mildly thickened  leaflets .  - Right ventricle: Systolic pressure was increased.  - Atrial septum: No defect or patent foramen ovale was  identified.  - Pulmonary arteries: PA peak pressure: 29m Hg (S).  Impressions:     Neuro/Psych PSYCHIATRIC DISORDERS Anxiety Depression negative neurological ROS     GI/Hepatic Neg liver ROS, GERD  Controlled,  Endo/Other  diabetes, Type 2, Oral Hypoglycemic Agents  Renal/GU negative Renal ROSLab Results       Component                Value               Date                      CREATININE               0.73                04/12/2020                Musculoskeletal  (+) Arthritis ,   Abdominal   Peds  Hematology negative hematology ROS (+) Lab Results      Component                Value               Date                      WBC  7.7                 04/12/2020                HGB                      12.2                04/12/2020                HCT                      41.0                04/12/2020                MCV                      79.5 (L)            04/12/2020                PLT                      527 (H)             04/12/2020              Anesthesia Other Findings   Reproductive/Obstetrics                            Anesthesia Physical Anesthesia Plan  ASA: II  Anesthesia Plan: MAC, Regional and Spinal   Post-op Pain Management:    Induction: Intravenous  PONV Risk Score and Plan: 1 and Propofol infusion and Treatment may vary due to age or medical condition  Airway Management Planned: Nasal Cannula  Additional Equipment: None  Intra-op Plan:   Post-operative Plan:   Informed Consent: I have reviewed the patients History and Physical, chart, labs and discussed the procedure including the risks, benefits and alternatives for the proposed anesthesia with the patient or authorized representative who has indicated his/her understanding and acceptance.     Dental advisory given  Plan Discussed with: CRNA  Anesthesia Plan Comments: (PAT note written by Myra Gianotti, PA-C. )       Anesthesia Quick Evaluation

## 2020-04-17 NOTE — Telephone Encounter (Signed)
Ortho bundle pre-op call completed. 

## 2020-04-17 NOTE — Telephone Encounter (Signed)
PRE-OPERATIVE CARDIAC RISK ASSESSMENT  04/17/20  Patient's name: [name].   MRN: [id number].    DOB: 20-Mar-1951  Primary care provider: Benito Mccreedy, MD. Referral provider: No referring provider defined for this encounter.  [name] is a 69 y.o. female for which I have been requested to assess estimated cardiac risk for planned non-cardiac surgery (left knee replacement, scheduled for 04/18/2020).  Patient's past medical history and cardiovascular risk factors include: Established coronary artery disease without angina pectoris, hypertension, hyperlipidemia, active smoking, peripheral artery disease status post PV angiogram with revascularization of the SFA, non-insulin-dependent diabetes, postmenopausal female, advanced age, obesity due to excess calories, mild aortic stenosis, history of prior GI bleed.  Patient denies any active chest pain and no recent unstable angina pectoris or myocardial infarction.   Her shortness of breath is chronic and stable (attributed to underlying mild aortic stenosis, active smoking, deconditioning).  Patient states that she is able to go up couple flights of stairs several times a day.  Her symptoms are stable and nonprogressive.  No recent hospitalizations or urgent care visits for cardiovascular symptoms (i.e. ACS, congestive heart failure, or dysrhythmias).  Patient has established coronary artery disease and no recent use of sublingual nitroglycerin tablets.  She continues to smoke half a pack per day.  Most recent EKG from 03/01/2020 showed normal sinus rhythm, 63 bpm, normal axis, poor R wave progression, frequent PACs, without underlying injury pattern.  Most recent echocardiogram from August 2020 notes preserved LVEF, grade 1 diastolic impairment, mild aortic stenosis, mild TR, no pulmonary hypertension per echo.  Patient has multiple nonmodifiable risk factors as noted above. She presents moderate cardiac risk for  the planned noncardiac surgery.  I do not believe that this is modifiable at this time.  I have also explained this to the patient and she understands and tells me that she is willing to accept this risk.  We will be available if needed.  Please call if any questions arise.  Given her underlying mild aortic stenosis would recommend avoiding hypotension and keeping her euvolemic.  This preoperative risk assessment is a tool to assist the surgeon in estimating the cardiac risk for the proposed upcoming noncardiac surgery.  The shared decision to proceed with surgery will be ultimately at the discretion of the patient after the surgical risks, benefits, and alternatives have been discussed amongst the patient and her surgical team.  Total telephone encounter: 11 minutes.  Sincerely,   Rex Kras, [place], [facility]  Pager: [phone] Office: [phone]

## 2020-04-18 ENCOUNTER — Ambulatory Visit (HOSPITAL_COMMUNITY): Payer: Medicare Other | Admitting: Vascular Surgery

## 2020-04-18 ENCOUNTER — Observation Stay (HOSPITAL_COMMUNITY)
Admission: RE | Admit: 2020-04-18 | Discharge: 2020-04-20 | Disposition: A | Discharge status: Home / Self Care | Payer: Medicare Other | Attending: Orthopedic Surgery | Admitting: Orthopedic Surgery

## 2020-04-18 ENCOUNTER — Encounter (HOSPITAL_COMMUNITY)
Admission: RE | Disposition: A | Discharge status: Home / Self Care | Payer: Self-pay | Source: Home / Self Care | Attending: Orthopedic Surgery

## 2020-04-18 ENCOUNTER — Other Ambulatory Visit: Payer: Self-pay

## 2020-04-18 ENCOUNTER — Ambulatory Visit (HOSPITAL_COMMUNITY): Payer: Medicare Other | Admitting: Certified Registered Nurse Anesthetist

## 2020-04-18 ENCOUNTER — Encounter (HOSPITAL_COMMUNITY): Payer: Self-pay | Admitting: Orthopedic Surgery

## 2020-04-18 DIAGNOSIS — Z7984 Long term (current) use of oral hypoglycemic drugs: Secondary | ICD-10-CM | POA: Insufficient documentation

## 2020-04-18 DIAGNOSIS — Z7982 Long term (current) use of aspirin: Secondary | ICD-10-CM | POA: Diagnosis not present

## 2020-04-18 DIAGNOSIS — F17210 Nicotine dependence, cigarettes, uncomplicated: Secondary | ICD-10-CM | POA: Diagnosis not present

## 2020-04-18 DIAGNOSIS — M1712 Unilateral primary osteoarthritis, left knee: Secondary | ICD-10-CM | POA: Diagnosis not present

## 2020-04-18 DIAGNOSIS — Z79899 Other long term (current) drug therapy: Secondary | ICD-10-CM | POA: Diagnosis not present

## 2020-04-18 DIAGNOSIS — E1159 Type 2 diabetes mellitus with other circulatory complications: Secondary | ICD-10-CM | POA: Insufficient documentation

## 2020-04-18 DIAGNOSIS — J449 Chronic obstructive pulmonary disease, unspecified: Secondary | ICD-10-CM | POA: Insufficient documentation

## 2020-04-18 DIAGNOSIS — E119 Type 2 diabetes mellitus without complications: Secondary | ICD-10-CM | POA: Diagnosis not present

## 2020-04-18 DIAGNOSIS — I10 Essential (primary) hypertension: Secondary | ICD-10-CM | POA: Insufficient documentation

## 2020-04-18 DIAGNOSIS — G8918 Other acute postprocedural pain: Secondary | ICD-10-CM | POA: Diagnosis not present

## 2020-04-18 HISTORY — PX: TOTAL KNEE ARTHROPLASTY: SHX125

## 2020-04-18 LAB — GLUCOSE, CAPILLARY
Glucose-Capillary: 82 mg/dL (ref 70–99)
Glucose-Capillary: 100 mg/dL — ABNORMAL HIGH (ref 70–99)

## 2020-04-18 SURGERY — ARTHROPLASTY, KNEE, TOTAL
Anesthesia: Monitor Anesthesia Care | Site: Knee | Laterality: Left

## 2020-04-18 MED ORDER — OXYCODONE HCL 5 MG PO TABS
5.0000 mg | ORAL_TABLET | ORAL | Status: DC | PRN
  Administered 2020-04-18 (×2): 5 mg via ORAL
  Administered 2020-04-19: 10 mg via ORAL
  Administered 2020-04-19: 5 mg via ORAL
  Administered 2020-04-20: 10 mg via ORAL
  Administered 2020-04-20: 5 mg via ORAL
  Filled 2020-04-18: qty 2
  Filled 2020-04-18: qty 1
  Filled 2020-04-18: qty 2
  Filled 2020-04-18 (×2): qty 1
  Filled 2020-04-18: qty 2

## 2020-04-18 MED ORDER — ACETAMINOPHEN 10 MG/ML IV SOLN: 1000.0000 mg | Freq: Once | INTRAVENOUS | Status: DC | PRN

## 2020-04-18 MED ORDER — DOCUSATE SODIUM 100 MG PO CAPS
100.0000 mg | ORAL_CAPSULE | Freq: Two times a day (BID) | ORAL | Status: DC
  Administered 2020-04-18 – 2020-04-20 (×5): 100 mg via ORAL
  Filled 2020-04-18 (×5): qty 1

## 2020-04-18 MED ORDER — ACETAMINOPHEN 500 MG PO TABS: 1000.0000 mg | ORAL_TABLET | Freq: Once | ORAL | Status: DC | PRN

## 2020-04-18 MED ORDER — METHOCARBAMOL 1000 MG/10ML IJ SOLN
500.0000 mg | Freq: Four times a day (QID) | INTRAVENOUS | Status: DC | PRN
  Filled 2020-04-18: qty 5

## 2020-04-18 MED ORDER — OXYCODONE HCL 5 MG/5ML PO SOLN
5.0000 mg | Freq: Once | ORAL | Status: AC | PRN
  Administered 2020-04-18: 5 mg via ORAL

## 2020-04-18 MED ORDER — AMLODIPINE BESYLATE 10 MG PO TABS
10.0000 mg | ORAL_TABLET | Freq: Every day | ORAL | Status: DC
  Administered 2020-04-19 – 2020-04-20 (×2): 10 mg via ORAL
  Filled 2020-04-18: qty 2
  Filled 2020-04-18: qty 1

## 2020-04-18 MED ORDER — METHOCARBAMOL 500 MG PO TABS
ORAL_TABLET | ORAL | Status: AC
  Filled 2020-04-18: qty 1

## 2020-04-18 MED ORDER — POVIDONE-IODINE 10 % EX SWAB: 2.0000 "application " | Freq: Once | CUTANEOUS | Status: DC

## 2020-04-18 MED ORDER — TRANEXAMIC ACID 1000 MG/10ML IV SOLN
2000.0000 mg | Freq: Once | INTRAVENOUS | Status: AC
  Administered 2020-04-18: 2000 mg via TOPICAL
  Filled 2020-04-18: qty 20

## 2020-04-18 MED ORDER — METHOCARBAMOL 500 MG PO TABS
500.0000 mg | ORAL_TABLET | Freq: Four times a day (QID) | ORAL | Status: DC | PRN
  Administered 2020-04-18 – 2020-04-20 (×4): 500 mg via ORAL
  Filled 2020-04-18 (×3): qty 1

## 2020-04-18 MED ORDER — CEFAZOLIN SODIUM-DEXTROSE 2-4 GM/100ML-% IV SOLN
2.0000 g | INTRAVENOUS | Status: AC
  Administered 2020-04-18: 2 g via INTRAVENOUS

## 2020-04-18 MED ORDER — CEFAZOLIN SODIUM-DEXTROSE 2-4 GM/100ML-% IV SOLN
2.0000 g | Freq: Three times a day (TID) | INTRAVENOUS | Status: AC
  Administered 2020-04-18 – 2020-04-19 (×2): 2 g via INTRAVENOUS
  Filled 2020-04-18 (×2): qty 100

## 2020-04-18 MED ORDER — CEFAZOLIN SODIUM-DEXTROSE 2-4 GM/100ML-% IV SOLN
INTRAVENOUS | Status: AC
  Filled 2020-04-18: qty 100

## 2020-04-18 MED ORDER — METOCLOPRAMIDE HCL 5 MG PO TABS: 5.0000 mg | ORAL_TABLET | Freq: Three times a day (TID) | ORAL | Status: DC | PRN

## 2020-04-18 MED ORDER — MORPHINE SULFATE (PF) 4 MG/ML IV SOLN
INTRAVENOUS | Status: AC
  Filled 2020-04-18: qty 2

## 2020-04-18 MED ORDER — BUPIVACAINE IN DEXTROSE 0.75-8.25 % IT SOLN
INTRATHECAL | Status: DC | PRN
  Administered 2020-04-18: 2 mL via INTRATHECAL

## 2020-04-18 MED ORDER — VANCOMYCIN HCL 1000 MG IV SOLR
INTRAVENOUS | Status: AC
  Filled 2020-04-18: qty 1000

## 2020-04-18 MED ORDER — LOSARTAN POTASSIUM 50 MG PO TABS
100.0000 mg | ORAL_TABLET | Freq: Every day | ORAL | Status: DC
  Administered 2020-04-18 – 2020-04-20 (×3): 100 mg via ORAL
  Filled 2020-04-18 (×3): qty 2

## 2020-04-18 MED ORDER — ROPIVACAINE HCL 7.5 MG/ML IJ SOLN
INTRAMUSCULAR | Status: DC | PRN
  Administered 2020-04-18: 20 mL via PERINEURAL

## 2020-04-18 MED ORDER — HYDROMORPHONE HCL 1 MG/ML IJ SOLN
0.5000 mg | INTRAMUSCULAR | Status: DC | PRN
  Administered 2020-04-18 – 2020-04-19 (×3): 0.5 mg via INTRAVENOUS
  Filled 2020-04-18 (×3): qty 0.5

## 2020-04-18 MED ORDER — LACTATED RINGERS IV SOLN: INTRAVENOUS | Status: DC

## 2020-04-18 MED ORDER — FENTANYL CITRATE (PF) 100 MCG/2ML IJ SOLN
INTRAMUSCULAR | Status: DC | PRN
  Administered 2020-04-18: 75 ug via INTRAVENOUS
  Administered 2020-04-18: 50 ug via INTRAVENOUS
  Administered 2020-04-18: 25 ug via INTRAVENOUS

## 2020-04-18 MED ORDER — CLONIDINE HCL (ANALGESIA) 100 MCG/ML EP SOLN
EPIDURAL | Status: AC
  Filled 2020-04-18: qty 10

## 2020-04-18 MED ORDER — TRANEXAMIC ACID-NACL 1000-0.7 MG/100ML-% IV SOLN
1000.0000 mg | INTRAVENOUS | Status: AC
  Administered 2020-04-18: 1000 mg via INTRAVENOUS

## 2020-04-18 MED ORDER — BUPIVACAINE HCL (PF) 0.25 % IJ SOLN
INTRAMUSCULAR | Status: AC
  Filled 2020-04-18: qty 30

## 2020-04-18 MED ORDER — ONDANSETRON HCL 4 MG/2ML IJ SOLN: 4.0000 mg | Freq: Four times a day (QID) | INTRAMUSCULAR | Status: DC | PRN

## 2020-04-18 MED ORDER — METOCLOPRAMIDE HCL 5 MG/ML IJ SOLN: 5.0000 mg | Freq: Three times a day (TID) | INTRAMUSCULAR | Status: DC | PRN

## 2020-04-18 MED ORDER — CLONIDINE HCL (ANALGESIA) 100 MCG/ML EP SOLN
EPIDURAL | Status: DC | PRN
  Administered 2020-04-18: 1 mL

## 2020-04-18 MED ORDER — PHENOL 1.4 % MT LIQD: 1.0000 | OROMUCOSAL | Status: DC | PRN

## 2020-04-18 MED ORDER — 0.9 % SODIUM CHLORIDE (POUR BTL) OPTIME
TOPICAL | Status: DC | PRN
  Administered 2020-04-18 (×3): 1000 mL

## 2020-04-18 MED ORDER — PROPOFOL 10 MG/ML IV BOLUS
INTRAVENOUS | Status: DC | PRN
  Administered 2020-04-18: 10 mg via INTRAVENOUS
  Administered 2020-04-18: 20 mg via INTRAVENOUS

## 2020-04-18 MED ORDER — FENTANYL CITRATE (PF) 100 MCG/2ML IJ SOLN
25.0000 ug | INTRAMUSCULAR | Status: DC | PRN
  Administered 2020-04-18 (×2): 25 ug via INTRAVENOUS

## 2020-04-18 MED ORDER — BUPIVACAINE HCL 0.25 % IJ SOLN
INTRAMUSCULAR | Status: DC | PRN
  Administered 2020-04-18: 30 mL

## 2020-04-18 MED ORDER — OXYCODONE HCL 5 MG PO TABS: 5.0000 mg | ORAL_TABLET | Freq: Once | ORAL | Status: AC | PRN

## 2020-04-18 MED ORDER — MIDAZOLAM HCL 5 MG/5ML IJ SOLN
INTRAMUSCULAR | Status: DC | PRN
  Administered 2020-04-18: 2 mg via INTRAVENOUS

## 2020-04-18 MED ORDER — ONDANSETRON HCL 4 MG PO TABS: 4.0000 mg | ORAL_TABLET | Freq: Four times a day (QID) | ORAL | Status: DC | PRN

## 2020-04-18 MED ORDER — CHLORHEXIDINE GLUCONATE 0.12 % MT SOLN: 15.0000 mL | Freq: Once | OROMUCOSAL | Status: AC

## 2020-04-18 MED ORDER — SODIUM CHLORIDE 0.9 % IR SOLN
Status: DC | PRN
  Administered 2020-04-18: 3000 mL

## 2020-04-18 MED ORDER — FENTANYL CITRATE (PF) 250 MCG/5ML IJ SOLN
INTRAMUSCULAR | Status: AC
  Filled 2020-04-18: qty 5

## 2020-04-18 MED ORDER — ACETAMINOPHEN 500 MG PO TABS
1000.0000 mg | ORAL_TABLET | Freq: Four times a day (QID) | ORAL | Status: AC
  Administered 2020-04-18 – 2020-04-19 (×4): 1000 mg via ORAL
  Filled 2020-04-18 (×4): qty 2

## 2020-04-18 MED ORDER — ASPIRIN 81 MG PO CHEW
81.0000 mg | CHEWABLE_TABLET | Freq: Two times a day (BID) | ORAL | Status: DC
  Administered 2020-04-18 – 2020-04-20 (×4): 81 mg via ORAL
  Filled 2020-04-18 (×4): qty 1

## 2020-04-18 MED ORDER — POVIDONE-IODINE 7.5 % EX SOLN: Freq: Once | CUTANEOUS | Status: AC

## 2020-04-18 MED ORDER — VANCOMYCIN HCL 1000 MG IV SOLR
INTRAVENOUS | Status: DC | PRN
  Administered 2020-04-18: 1000 mg

## 2020-04-18 MED ORDER — CHLORHEXIDINE GLUCONATE 0.12 % MT SOLN
OROMUCOSAL | Status: AC
  Administered 2020-04-18: 15 mL via OROMUCOSAL
  Filled 2020-04-18: qty 15

## 2020-04-18 MED ORDER — ORAL CARE MOUTH RINSE: 15.0000 mL | Freq: Once | OROMUCOSAL | Status: AC

## 2020-04-18 MED ORDER — ACETAMINOPHEN 10 MG/ML IV SOLN
INTRAVENOUS | Status: AC
  Administered 2020-04-18: 1000 mg via INTRAVENOUS
  Filled 2020-04-18: qty 100

## 2020-04-18 MED ORDER — FENTANYL CITRATE (PF) 100 MCG/2ML IJ SOLN
INTRAMUSCULAR | Status: AC
  Administered 2020-04-18: 50 ug via INTRAVENOUS
  Filled 2020-04-18: qty 2

## 2020-04-18 MED ORDER — OXYCODONE HCL 5 MG/5ML PO SOLN
ORAL | Status: AC
  Filled 2020-04-18: qty 5

## 2020-04-18 MED ORDER — GABAPENTIN 600 MG PO TABS
600.0000 mg | ORAL_TABLET | Freq: Every evening | ORAL | Status: DC | PRN
  Administered 2020-04-20: 600 mg via ORAL
  Filled 2020-04-18 (×2): qty 1

## 2020-04-18 MED ORDER — IRRISEPT - 450ML BOTTLE WITH 0.05% CHG IN STERILE WATER, USP 99.95% OPTIME
TOPICAL | Status: DC | PRN
  Administered 2020-04-18: 450 mL via TOPICAL

## 2020-04-18 MED ORDER — SODIUM CHLORIDE 0.9% FLUSH
INTRAVENOUS | Status: DC | PRN
  Administered 2020-04-18: 20 mL

## 2020-04-18 MED ORDER — ALBUTEROL SULFATE HFA 108 (90 BASE) MCG/ACT IN AERS
1.0000 | INHALATION_SPRAY | Freq: Four times a day (QID) | RESPIRATORY_TRACT | Status: DC | PRN
  Filled 2020-04-18: qty 6.7

## 2020-04-18 MED ORDER — PROPOFOL 500 MG/50ML IV EMUL
INTRAVENOUS | Status: DC | PRN
  Administered 2020-04-18: 65 ug/kg/min via INTRAVENOUS

## 2020-04-18 MED ORDER — ACETAMINOPHEN 160 MG/5ML PO SOLN: 1000.0000 mg | Freq: Once | ORAL | Status: DC | PRN

## 2020-04-18 MED ORDER — SPIRONOLACTONE 25 MG PO TABS
50.0000 mg | ORAL_TABLET | Freq: Every day | ORAL | Status: DC
  Administered 2020-04-18 – 2020-04-20 (×3): 50 mg via ORAL
  Filled 2020-04-18 (×3): qty 2

## 2020-04-18 MED ORDER — PROPOFOL 10 MG/ML IV BOLUS
INTRAVENOUS | Status: AC
  Filled 2020-04-18: qty 20

## 2020-04-18 MED ORDER — HYDRALAZINE HCL 50 MG PO TABS
100.0000 mg | ORAL_TABLET | Freq: Three times a day (TID) | ORAL | Status: DC
  Administered 2020-04-18 – 2020-04-20 (×6): 100 mg via ORAL
  Filled 2020-04-18 (×7): qty 2

## 2020-04-18 MED ORDER — MORPHINE SULFATE (PF) 4 MG/ML IV SOLN
INTRAVENOUS | Status: DC | PRN
  Administered 2020-04-18: 8 mg via INTRAVENOUS

## 2020-04-18 MED ORDER — MIDAZOLAM HCL 2 MG/2ML IJ SOLN
INTRAMUSCULAR | Status: AC
  Filled 2020-04-18: qty 2

## 2020-04-18 MED ORDER — METFORMIN HCL 500 MG PO TABS
250.0000 mg | ORAL_TABLET | Freq: Two times a day (BID) | ORAL | Status: DC
  Administered 2020-04-18 – 2020-04-20 (×4): 250 mg via ORAL
  Filled 2020-04-18 (×4): qty 1

## 2020-04-18 MED ORDER — TRANEXAMIC ACID-NACL 1000-0.7 MG/100ML-% IV SOLN
INTRAVENOUS | Status: AC
  Filled 2020-04-18: qty 100

## 2020-04-18 MED ORDER — PROPOFOL 10 MG/ML IV BOLUS
INTRAVENOUS | Status: AC
  Filled 2020-04-18: qty 40

## 2020-04-18 MED ORDER — BUPIVACAINE LIPOSOME 1.3 % IJ SUSP
20.0000 mL | INTRAMUSCULAR | Status: AC
  Administered 2020-04-18: 20 mL
  Filled 2020-04-18: qty 20

## 2020-04-18 MED ORDER — MENTHOL 3 MG MT LOZG: 1.0000 | LOZENGE | OROMUCOSAL | Status: DC | PRN

## 2020-04-18 SURGICAL SUPPLY — 73 items
BAG DECANTER FOR FLEXI CONT (MISCELLANEOUS) ×3 IMPLANT
BANDAGE ESMARK 6X9 LF (GAUZE/BANDAGES/DRESSINGS) ×1 IMPLANT
BLADE SAG 18X100X1.27 (BLADE) ×3 IMPLANT
BNDG CMPR 9X6 STRL LF SNTH (GAUZE/BANDAGES/DRESSINGS) ×1
BNDG CMPR MED 15X6 ELC VLCR LF (GAUZE/BANDAGES/DRESSINGS) ×1
BNDG COHESIVE 6X5 TAN STRL LF (GAUZE/BANDAGES/DRESSINGS) ×3 IMPLANT
BNDG ELASTIC 6X15 VLCR STRL LF (GAUZE/BANDAGES/DRESSINGS) ×3 IMPLANT
BNDG ESMARK 6X9 LF (GAUZE/BANDAGES/DRESSINGS) ×3
BSPLAT TIB 3 KN TRITANIUM (Knees) ×1 IMPLANT
CLOSURE WOUND 1/2 X4 (GAUZE/BANDAGES/DRESSINGS) ×1
CNTNR URN SCR LID CUP LEK RST (MISCELLANEOUS) ×1 IMPLANT
COMP FEMORAL TRIATHLON SZ3 (Joint) ×3 IMPLANT
COMPONENT FEMRL TRIATHLON SZ3 (Joint) IMPLANT
CONT SPEC 4OZ STRL OR WHT (MISCELLANEOUS) ×3
COVER SURGICAL LIGHT HANDLE (MISCELLANEOUS) ×3 IMPLANT
COVER WAND RF STERILE (DRAPES) ×3 IMPLANT
CUFF TOURN SGL QUICK 42 (TOURNIQUET CUFF) ×2 IMPLANT
DECANTER SPIKE VIAL GLASS SM (MISCELLANEOUS) ×5 IMPLANT
DRAPE INCISE IOBAN 66X45 STRL (DRAPES) ×2 IMPLANT
DRAPE ORTHO SPLIT 77X108 STRL (DRAPES) ×9
DRAPE SURG ORHT 6 SPLT 77X108 (DRAPES) ×3 IMPLANT
DRAPE U-SHAPE 47X51 STRL (DRAPES) ×3 IMPLANT
DRSG AQUACEL AG ADV 3.5X14 (GAUZE/BANDAGES/DRESSINGS) ×2 IMPLANT
DURAPREP 26ML APPLICATOR (WOUND CARE) ×6 IMPLANT
ELECT CAUTERY BLADE 6.4 (BLADE) ×3 IMPLANT
ELECT REM PT RETURN 9FT ADLT (ELECTROSURGICAL) ×3
ELECTRODE REM PT RTRN 9FT ADLT (ELECTROSURGICAL) ×1 IMPLANT
GAUZE SPONGE 4X4 12PLY STRL (GAUZE/BANDAGES/DRESSINGS) ×3 IMPLANT
GLOVE BIOGEL PI IND STRL 7.0 (GLOVE) ×1 IMPLANT
GLOVE BIOGEL PI IND STRL 8 (GLOVE) ×1 IMPLANT
GLOVE BIOGEL PI INDICATOR 7.0 (GLOVE) ×2
GLOVE BIOGEL PI INDICATOR 8 (GLOVE) ×2
GLOVE ECLIPSE 7.0 STRL STRAW (GLOVE) ×3 IMPLANT
GLOVE ECLIPSE 8.0 STRL XLNG CF (GLOVE) ×3 IMPLANT
GOWN STRL REUS W/ TWL LRG LVL3 (GOWN DISPOSABLE) ×3 IMPLANT
GOWN STRL REUS W/TWL LRG LVL3 (GOWN DISPOSABLE) ×9
HANDPIECE INTERPULSE COAX TIP (DISPOSABLE) ×3
HOOD PEEL AWAY FLYTE STAYCOOL (MISCELLANEOUS) ×11 IMPLANT
IMMOBILIZER KNEE 22 UNIV (SOFTGOODS) ×2 IMPLANT
IMMOBILIZER KNEE 24 THIGH 36 (MISCELLANEOUS) IMPLANT
IMMOBILIZER KNEE 24 UNIV (MISCELLANEOUS) ×3
INSERT TIB BEARING SZ3 11 (Miscellaneous) ×2 IMPLANT
KIT BASIN OR (CUSTOM PROCEDURE TRAY) ×3 IMPLANT
KIT TURNOVER KIT B (KITS) ×3 IMPLANT
KNEE PATELLA ASYMMETRIC 10X32 (Knees) ×2 IMPLANT
KNEE TIBIAL COMPONENT SZ3 (Knees) ×2 IMPLANT
MANIFOLD NEPTUNE II (INSTRUMENTS) ×3 IMPLANT
NDL SPNL 18GX3.5 QUINCKE PK (NEEDLE) ×1 IMPLANT
NEEDLE 22X1 1/2 (OR ONLY) (NEEDLE) ×6 IMPLANT
NEEDLE SPNL 18GX3.5 QUINCKE PK (NEEDLE) ×3 IMPLANT
NS IRRIG 1000ML POUR BTL (IV SOLUTION) ×6 IMPLANT
PACK TOTAL JOINT (CUSTOM PROCEDURE TRAY) ×3 IMPLANT
PAD ARMBOARD 7.5X6 YLW CONV (MISCELLANEOUS) ×6 IMPLANT
PAD CAST 4YDX4 CTTN HI CHSV (CAST SUPPLIES) ×1 IMPLANT
PADDING CAST COTTON 4X4 STRL (CAST SUPPLIES) ×3
PADDING CAST COTTON 6X4 STRL (CAST SUPPLIES) ×3 IMPLANT
PIN FLUTED HEDLESS FIX 3.5X1/8 (PIN) ×2 IMPLANT
SET HNDPC FAN SPRY TIP SCT (DISPOSABLE) ×1 IMPLANT
STRIP CLOSURE SKIN 1/2X4 (GAUZE/BANDAGES/DRESSINGS) ×3 IMPLANT
SUCTION FRAZIER HANDLE 10FR (MISCELLANEOUS) ×3
SUCTION TUBE FRAZIER 10FR DISP (MISCELLANEOUS) ×1 IMPLANT
SUT MNCRL AB 3-0 PS2 18 (SUTURE) ×3 IMPLANT
SUT VIC AB 0 CT1 27 (SUTURE) ×9
SUT VIC AB 0 CT1 27XBRD ANBCTR (SUTURE) ×3 IMPLANT
SUT VIC AB 1 CT1 27 (SUTURE) ×24
SUT VIC AB 1 CT1 27XBRD ANBCTR (SUTURE) ×5 IMPLANT
SUT VIC AB 2-0 CT1 27 (SUTURE) ×15
SUT VIC AB 2-0 CT1 TAPERPNT 27 (SUTURE) ×4 IMPLANT
SYR 30ML LL (SYRINGE) ×9 IMPLANT
SYR TB 1ML LUER SLIP (SYRINGE) ×3 IMPLANT
TOWEL GREEN STERILE (TOWEL DISPOSABLE) ×6 IMPLANT
TOWEL GREEN STERILE FF (TOWEL DISPOSABLE) ×6 IMPLANT
WATER STERILE IRR 1000ML POUR (IV SOLUTION) ×2 IMPLANT

## 2020-04-18 NOTE — Anesthesia Procedure Notes (Signed)
Anesthesia Regional Block: Adductor canal block   Pre-Anesthetic Checklist: ,, timeout performed, Correct Patient, Correct Site, Correct Laterality, Correct Procedure, Correct Position, site marked, Risks and benefits discussed,  Surgical consent,  Pre-op evaluation,  At surgeon's request and post-op pain management  Laterality: Left and Lower  Prep: chloraprep       Needles:  Injection technique: Single-shot     Needle Length: 9cm  Needle Gauge: 22     Additional Needles: Arrow StimuQuik ECHO Echogenic Stimulating PNB Needle  Procedures:,,,, ultrasound used (permanent image in chart),,,,  Narrative:  Start time: 04/18/2020 7:24 AM End time: 04/18/2020 7:28 AM Injection made incrementally with aspirations every 5 mL.  Performed by: Personally  Anesthesiologist: Oleta Mouse, MD

## 2020-04-18 NOTE — Evaluation (Signed)
Physical Therapy Evaluation Patient Details Name: [name] MRN: [id number] DOB: Sep 20, 1950 Today's Date: 04/18/2020   History of Present Illness  Pt is a 69 y/o female s/p L TKA. PMH includes COPD, CAD, DM, HTN, and PVD.   Clinical Impression  Pt is s/p surgery above with deficits below. Required min A to come to sitting at EOB. Pt reporting 10/10 pain, so returned to supine. Educated about knee precautions. Anticipate pt will progress well once pain controlled. Will continue to follow acutely to maximize functional mobility independence and safety.     Follow Up Recommendations Follow surgeon's recommendation for DC plan and follow-up therapies    Equipment Recommendations  Rolling walker with 5" wheels;3in1 (PT)    Recommendations for Other Services       Precautions / Restrictions Precautions Precautions: Knee Precaution Booklet Issued: No Precaution Comments: Verbally reviewed knee precautions Restrictions Weight Bearing Restrictions: Yes LLE Weight Bearing: Weight bearing as tolerated      Mobility  Bed Mobility Overal bed mobility: Needs Assistance Bed Mobility: Supine to Sit;Sit to Supine;Rolling Rolling: Supervision   Supine to sit: Min assist Sit to supine: Min assist   General bed mobility comments: Min A for LLE assist to come to sitting. Pt reporting 10/10 pain and was unable to tolerate further mobility. Pt was able to roll from sided to side with use of bedrails to change bed linens.    Transfers                    Ambulation/Gait                Stairs            Wheelchair Mobility    Modified Rankin (Stroke Patients Only)       Balance Overall balance assessment: Needs assistance Sitting-balance support: No upper extremity supported;Feet supported Sitting balance-Leahy Scale: Fair                                       Pertinent Vitals/Pain Pain Assessment: 0-10 Pain Score: 10-Worst pain  ever Pain Location: L knee  Pain Descriptors / Indicators: Aching;Operative site guarding Pain Intervention(s): Limited activity within patient's tolerance;Monitored during session;Repositioned    Home Living Family/patient expects to be discharged to:: Private residence Living Arrangements: Alone Available Help at Discharge: Family;Available 24 hours/day Type of Home: Apartment Home Access: Stairs to enter Entrance Stairs-Rails: Right;Left;Can reach both Entrance Stairs-Number of Steps: 2 flights Home Layout: One level Home Equipment: Shower seat;Cane - single point;Crutches      Prior Function Level of Independence: Independent with assistive device(s)         Comments: Used cane occasionally     Hand Dominance   Dominant Hand: Right    Extremity/Trunk Assessment   Upper Extremity Assessment Upper Extremity Assessment: Defer to OT evaluation    Lower Extremity Assessment Lower Extremity Assessment: LLE deficits/detail LLE Deficits / Details: Deficits consistent with post op pain and weakness.     Cervical / Trunk Assessment Cervical / Trunk Assessment: Normal  Communication   Communication: No difficulties  Cognition Arousal/Alertness: Awake/alert Behavior During Therapy: WFL for tasks assessed/performed Overall Cognitive Status: Within Functional Limits for tasks assessed  General Comments      Exercises     Assessment/Plan    PT Assessment Patient needs continued PT services  PT Problem List Decreased strength;Decreased range of motion;Decreased activity tolerance;Decreased balance;Decreased mobility;Decreased knowledge of use of DME;Decreased knowledge of precautions;Pain       PT Treatment Interventions DME instruction;Gait training;Functional mobility training;Therapeutic exercise;Therapeutic activities;Stair training;Balance training;Patient/family education    PT Goals (Current goals can  be found in the Care Plan section)  Acute Rehab PT Goals Patient Stated Goal: to decrease pain  PT Goal Formulation: With patient Time For Goal Achievement: 05/02/20 Potential to Achieve Goals: Fair    Frequency 7X/week   Barriers to discharge        Co-evaluation               AM-PAC PT "6 Clicks" Mobility  Outcome Measure Help needed turning from your back to your side while in a flat bed without using bedrails?: A Little Help needed moving from lying on your back to sitting on the side of a flat bed without using bedrails?: A Little Help needed moving to and from a bed to a chair (including a wheelchair)?: A Lot Help needed standing up from a chair using your arms (e.g., wheelchair or bedside chair)?: A Lot Help needed to walk in hospital room?: A Lot Help needed climbing 3-5 steps with a railing? : A Lot 6 Click Score: 14    End of Session   Activity Tolerance: Patient limited by pain Patient left: in bed;with call bell/phone within reach Nurse Communication: Mobility status PT Visit Diagnosis: Other abnormalities of gait and mobility (R26.89);Pain Pain - Right/Left: Left Pain - part of body: Knee    Time: 4854-6270 PT Time Calculation (min) (ACUTE ONLY): 20 min   Charges:   PT Evaluation $PT Eval Low Complexity: 1 Low          [name], DPT  Acute Rehabilitation Services  Pager: [phone] Office: [phone]   [name] 04/18/2020, 2:52 PM

## 2020-04-18 NOTE — Anesthesia Procedure Notes (Signed)
Spinal  Patient location during procedure: OR Start time: 04/18/2020 7:51 AM End time: 04/18/2020 8:01 AM Staffing Performed: anesthesiologist  Anesthesiologist: Oleta Mouse, MD Preanesthetic Checklist Completed: patient identified, IV checked, risks and benefits discussed, surgical consent, monitors and equipment checked, pre-op evaluation and timeout performed Spinal Block Patient position: sitting Prep: DuraPrep Patient monitoring: heart rate, cardiac monitor, continuous pulse ox and blood pressure Approach: midline Location: L4-5 Injection technique: single-shot Needle Needle type: Pencan  Needle gauge: 24 G Needle length: 9 cm Assessment Sensory level: T6

## 2020-04-18 NOTE — Transfer of Care (Signed)
Immediate Anesthesia Transfer of Care Note  Patient: [name]  Procedure(s) Performed: LEFT TOTAL KNEE ARTHROPLASTY (Left Knee)  Patient Location: PACU  Anesthesia Type:Spinal  Level of Consciousness: awake, alert  and oriented  Airway & Oxygen Therapy: Patient Spontanous Breathing  Post-op Assessment: Report given to RN, Post -op Vital signs reviewed and stable and Patient moving all extremities  Post vital signs: Reviewed and stable  Last Vitals:  Vitals Value Taken Time  BP    Temp    Pulse    Resp    SpO2      Last Pain:  Vitals:   04/18/20 0609  PainSc: 0-No pain         Complications: No complications documented.

## 2020-04-18 NOTE — H&P (Signed)
TOTAL KNEE ADMISSION H&P  Patient is being admitted for left total knee arthroplasty.  Subjective:  Chief Complaint:left knee pain.  HPI: [name], 69 y.o. female, has a history of pain and functional disability in the left knee due to arthritis and has failed non-surgical conservative treatments for greater than 12 weeks to includeNSAID's and/or analgesics, corticosteriod injections, viscosupplementation injections, use of assistive devices and activity modification.  Onset of symptoms was gradual, starting 8 years ago with gradually worsening course since that time. The patient noted no past surgery on the left knee(s).  Patient currently rates pain in the left knee(s) at 9 out of 10 with activity. Patient has night pain, worsening of pain with activity and weight bearing, pain that interferes with activities of daily living, pain with passive range of motion, crepitus and joint swelling.  Patient has evidence of subchondral sclerosis and joint space narrowing by imaging studies. This patient has had Preop cardiac risk ratification which puts her risk at moderate.  Patient understands that she does have high risk factors than normal.  Nonetheless she is willing to accept this risk regarding the stress of surgery.  Cardiology is available for consultation if needed.. There is no active infection.  Patient Active Problem List   Diagnosis Date Noted  . Tobacco use disorder 09/18/2018  . Iron deficiency anemia 04/09/2018  . Symptomatic anemia 12/26/2015  . Hypokalemia 12/26/2015  . Type 2 diabetes mellitus with peripheral vascular disease ([place]) 12/26/2015  . Claudication in peripheral vascular disease ([place]) 10/05/2013  . Discomfort in chest 05/11/2012  . Nicotine dependence 05/11/2012  . Bradycardia 05/11/2012  . HTN (hypertension) 05/11/2012  . Back pain 05/11/2012   Past Medical History:  Diagnosis Date  . Anemia 12/26/2015  . Anxiety   . Arthritis   . Blood transfusion  without reported diagnosis   . Chronic pain    resolved per pt 04/12/20  . COPD (chronic obstructive pulmonary disease) ([place])   . Coronary artery disease   . Depression   . Diabetes mellitus without complication ([place])    type 2  . GERD (gastroesophageal reflux disease)   . Heart murmur    since birth; Echo 02/18/19: LVEF 62%, grade 1 diastolic dysfunction, mild AS (mean grad 12 mmHg), trace MR/PR, mild TR, PASP 32 mmHg    . Hyperlipemia   . Hypertension   . PVD (peripheral vascular disease) ([place])    right SFA stent 02/11/17 by Dr. Einar Gip  . Sleep apnea    does not use cpap  . Wears glasses     Past Surgical History:  Procedure Laterality Date  . ABDOMINAL HYSTERECTOMY    . CARDIAC CATHETERIZATION    . CATARACT EXTRACTION    . CESAREAN SECTION    . COLONOSCOPY N/A 01/08/2013   Procedure: COLONOSCOPY;  Surgeon: Beryle Beams, MD;  Location: [facility] ENDOSCOPY;  Service: Endoscopy;  Laterality: N/A;  . COLONOSCOPY N/A 12/28/2015   Procedure: COLONOSCOPY;  Surgeon: Carol Ada, MD;  Location: [facility] ENDOSCOPY;  Service: Endoscopy;  Laterality: N/A;  . ENTEROSCOPY N/A 12/28/2015   Procedure: ENTEROSCOPY;  Surgeon: Carol Ada, MD;  Location: [place];  Service: Endoscopy;  Laterality: N/A;  . ENTEROSCOPY N/A 02/20/2018   Procedure: ENTEROSCOPY;  Surgeon: Carol Ada, MD;  Location: [facility] ENDOSCOPY;  Service: Endoscopy;  Laterality: N/A;  . ENTEROSCOPY N/A 03/27/2018   Procedure: ENTEROSCOPY;  Surgeon: Carol Ada, MD;  Location: [facility] ENDOS[facility];  Service: Endoscopy;  Laterality: N/A;  . ESOPHAGOGASTRODUODENOSCOPY N/A 01/08/2013  Procedure: ESOPHAGOGASTRODUODENOSCOPY (EGD);  Surgeon: Beryle Beams, MD;  Location: [facility];  Service: Endoscopy;  Laterality: N/A;  . EYE SURGERY    . HAND SURGERY    . HOT HEMOSTASIS N/A 12/28/2015   Procedure: HOT HEMOSTASIS (ARGON PLASMA COAGULATION/BICAP);  Surgeon: Carol Ada, MD;  Location: [facility] ENDOSCOPY;  Service: Endoscopy;  Laterality: N/A;  . HOT  HEMOSTASIS N/A 02/20/2018   Procedure: HOT HEMOSTASIS (ARGON PLASMA COAGULATION/BICAP);  Surgeon: Carol Ada, MD;  Location: [facility]OS[facility];  Service: Endoscopy;  Laterality: N/A;  . HOT HEMOSTASIS N/A 03/27/2018   Procedure: HOT HEMOSTASIS (ARGON PLASMA COAGULATION/BICAP);  Surgeon: Carol Ada, MD;  Location: [facility];  Service: Endoscopy;  Laterality: N/A;  . LEFT HEART CATHETERIZATION WITH CORONARY ANGIOGRAM N/A 03/09/2013   Procedure: LEFT HEART CATHETERIZATION WITH CORONARY ANGIOGRAM;  Surgeon: Laverda Page, MD;  Location: [facility] CATH LAB;  Service: Cardiovascular;  Laterality: N/A;  . LOWER EXTREMITY ANGIOGRAM N/A 12/29/2012   Procedure: LOWER EXTREMITY ANGIOGRAM;  Surgeon: Laverda Page, MD;  Location: [facility] CATH LAB;  Service: Cardiovascular;  Laterality: N/A;  . LOWER EXTREMITY ANGIOGRAM N/A 10/05/2013   Procedure: LOWER EXTREMITY ANGIOGRAM;  Surgeon: Laverda Page, MD;  Location: [facility] CATH LAB;  Service: Cardiovascular;  Laterality: N/A;  . LOWER EXTREMITY ANGIOGRAPHY N/A 02/11/2017   Procedure: Lower Extremity Angiography;  Surgeon: Adrian Prows, MD;  Location: [place] CV LAB;  Service: Cardiovascular;  Laterality: N/A;  . PERIPHERAL VASCULAR BALLOON ANGIOPLASTY  02/11/2017   Procedure: PERIPHERAL VASCULAR BALLOON ANGIOPLASTY;  Surgeon: Adrian Prows, MD;  Location: [place] CV LAB;  Service: Cardiovascular;;  Right SFA  . PERIPHERAL VASCULAR INTERVENTION Right 02/11/2017   Procedure: PERIPHERAL VASCULAR INTERVENTION;  Surgeon: Adrian Prows, MD;  Location: [place] CV LAB;  Service: Cardiovascular;  Laterality: Right;  Rt SFA  . stent in right leg  12/29/2012   for blood clot, Dr. Nadyne Coombes  . TYMPANOMASTOIDECTOMY Left 03/08/2014   Procedure: TYMPANOMASTOIDECTOMY LEFT;  Surgeon: Ascencion Dike, MD;  Location: [place];  Service: ENT;  Laterality: Left;  . TYMPANOMASTOIDECTOMY  2015  . UTERINE FIBROID SURGERY      Current Facility-Administered Medications  Medication  Dose Route Frequency Provider Last Rate Last Admin  . bupivacaine liposome (EXPAREL) 1.3 % injection 266 mg  20 mL Infiltration To OR Meredith Pel, MD      . ceFAZolin (ANCEF) 2-4 GM/100ML-% IVPB           . ceFAZolin (ANCEF) IVPB 2g/100 mL premix  2 g Intravenous On Call to OR Magnant, Charles L, PA-C      . lactated ringers infusion   Intravenous Continuous Stoltzfus, Amrie Gurganus P, DO      . povidone-iodine 10 % swab 2 application  2 application Topical Once Magnant, Charles L, PA-C      . povidone-iodine 10 % swab 2 application  2 application Topical Once Magnant, Charles L, PA-C      . tranexamic acid (CYKLOKAPRON) IVPB 1,000 mg  1,000 mg Intravenous To OR Magnant, Charles L, PA-C       No Known Allergies  Social History   Tobacco Use  . Smoking status: Current Every Day Smoker    Packs/day: 0.50    Years: 41.00    Pack years: 20.50    Types: Cigarettes  . Smokeless tobacco: Never Used  Substance Use Topics  . Alcohol use: Yes    Comment: occasional    Family History  Problem Relation Age of Onset  .  Diabetes Mother   . Hypertension Mother   . Heart disease Mother   . Heart disease Father   . Hypertension Father   . Hypertension Sister   . Hypertension Brother   . Diabetes Brother   . Hypertension Sister   . Hypertension Brother   . Diabetes Brother   . Hypertension Brother   . Hypertension Brother   . Hypertension Brother      Review of Systems  Musculoskeletal: Positive for arthralgias.  All other systems reviewed and are negative.   Objective:  Physical Exam Vitals reviewed.  HENT:     Head: Normocephalic.     Right Ear: Tympanic membrane normal.     Nose: Nose normal.     Mouth/Throat:     Mouth: Mucous membranes are moist.  Eyes:     Pupils: Pupils are equal, round, and reactive to light.  Cardiovascular:     Rate and Rhythm: Normal rate.     Pulses: Normal pulses.  Pulmonary:     Effort: Pulmonary effort is normal.  Abdominal:     General:  Abdomen is flat.  Musculoskeletal:     Cervical back: Normal range of motion.  Skin:    General: Skin is warm.     Capillary Refill: Capillary refill takes less than 2 seconds.  Neurological:     General: No focal deficit present.     Mental Status: She is alert.  Psychiatric:        Mood and Affect: Mood normal.   Left knee exam demonstrates range of motion 5-1 15.  Slight laxity to valgus stress in 30 degrees of flexion.  Pedal pulses palpable.  Ankle dorsiflexion intact.  Extensor mechanism intact.  Skin intact in the left knee region.  Vital signs in last 24 hours: Temp:  [97.5 F (36.4 C)] 97.5 F (36.4 C) (10/26 0552) Pulse Rate:  [58] 58 (10/26 0552) Resp:  [18] 18 (10/26 0552) BP: (175)/(67) 175/67 (10/26 0552) SpO2:  [100 %] 100 % (10/26 0552) Weight:  [92.1 kg] 92.1 kg (10/26 0552)  Labs:   Estimated body mass index is 39.65 kg/m as calculated from the following:   Height as of this encounter: 5' (1.524 m).   Weight as of this encounter: 92.1 kg.   Imaging Review Plain radiographs demonstrate severe degenerative joint disease of the left knee(s). The overall alignment ismild varus. The bone quality appears to be good for age and reported activity level.      Assessment/Plan:  End stage arthritis, left knee   The patient history, physical examination, clinical judgment of the provider and imaging studies are consistent with end stage degenerative joint disease of the left knee(s) and total knee arthroplasty is deemed medically necessary. The treatment options including medical management, injection therapy arthroscopy and arthroplasty were discussed at length. The risks and benefits of total knee arthroplasty were presented and reviewed. The risks due to aseptic loosening, infection, stiffness, patella tracking problems, thromboembolic complications and other imponderables were discussed. The patient acknowledged the explanation, agreed to proceed with the plan  and consent was signed. Patient is being admitted for inpatient treatment for surgery, pain control, PT, OT, prophylactic antibiotics, VTE prophylaxis, progressive ambulation and ADL's and discharge planning. The patient is planning to be discharged to skilled nursing facility     Patient's anticipated LOS is less than 2 midnights, meeting these requirements: - Younger than 35 - Lives within 1 hour of care - Has a competent adult at home to recover  with post-op recover - NO history of  - Chronic pain requiring opiods  - Diabetes  - Coronary Artery Disease  - Heart failure  - Heart attack  - Stroke  - DVT/VTE  - Cardiac arrhythmia  - Respiratory Failure/COPD  - Renal failure  - Anemia  - Advanced Liver disease

## 2020-04-18 NOTE — Progress Notes (Signed)
Patient admitted to room. Alert and oriented x4. Mild pain to left knee. Ice applied. Vital sign stable.

## 2020-04-18 NOTE — Progress Notes (Signed)
Pt in bed Having expected post op pain Ankle df ok Foot perfused Will see how she mobilizes with PT

## 2020-04-18 NOTE — Care Plan (Signed)
Ortho Bundle Case Management Note  Patient Details  Name: [name] MRN: [id number] Date of Birth: [birth year]      RNCM call to patient to discuss her upcoming left total knee arthroplasty with Dr. Marlou Sa. She is an Ortho bundle patient through [facility]. She is agreeable to case management. She has grandchildren that will be assisting her after discharge home. She will need a CPM, FWW, and 3in1 after surgery. Referral made to [place]. They have attempted to contact her before surgery, but she did not return their calls. Will attempt to set up with all DME in hospital before discharge per [employer] rep. Anticipate HHPT after discharge. Choice provided and referral made to [facility]. Will continue to follow for needs.           DME Arranged:  3-N-1, Walker rolling, Continuous passive motion machine ([employer] was contacted prior to surgery, but unable to reach patient- Rep informed they would reach her in hospital) DME Agency:  [employer]  HH Arranged:  PT [place] Agency:  [facility] (now [facility])  Additional Comments: Please contact me with any questions of if this plan should need to change.  Jamse Arn, RN, BSN, [employer]  [phone] 04/18/2020, 3:11 PM

## 2020-04-18 NOTE — Brief Op Note (Signed)
   04/18/2020  10:42 AM  PATIENT:  [name]  69 y.o. female  PRE-OPERATIVE DIAGNOSIS:  left knee osteoarthritis  POST-OPERATIVE DIAGNOSIS:  left knee osteoarthritis  PROCEDURE:  Procedure(s): LEFT TOTAL KNEE ARTHROPLASTY  SURGEON:  Surgeon(s): Meredith Pel, MD  ASSISTANT: magnant pa  ANESTHESIA:   spinal  EBL: 50 ml    Total I/O In: 1300 [I.V.:1300] Out: 200 [Urine:150; Blood:50]  BLOOD ADMINISTERED: none  DRAINS: none   LOCAL MEDICATIONS USED: Marcaine morphine clonidine Exparel vancomycin  SPECIMEN:  No Specimen  COUNTS:  YES  TOURNIQUET:   Total Tourniquet Time Documented: Thigh (Left) - 65 minutes Total: Thigh (Left) - 65 minutes   DICTATION: .Other Dictation: Dictation Number [id number]  PLAN OF CARE: Admit for overnight observation  PATIENT DISPOSITION:  PACU - hemodynamically stable

## 2020-04-18 NOTE — Anesthesia Procedure Notes (Signed)
Procedure Name: MAC Date/Time: 04/18/2020 7:36 AM Performed by: Leonor Liv, CRNA Pre-anesthesia Checklist: Patient identified, Emergency Drugs available, Suction available, Timeout performed and Patient being monitored Patient Re-evaluated:Patient Re-evaluated prior to induction Oxygen Delivery Method: Simple face mask Placement Confirmation: positive ETCO2 Dental Injury: Teeth and Oropharynx as per pre-operative assessment

## 2020-04-18 NOTE — Progress Notes (Signed)
Orthopedic Tech Progress Note Patient Details:  [name] [birth year] [id number]  CPM Left Knee CPM Left Knee: On Left Knee Flexion (Degrees): 10 Left Knee Extension (Degrees): 40 CPM Right Knee Additional Comments: CPM left knee   Post Interventions Patient Tolerated: Well  [name] 04/18/2020, 7:04 PM

## 2020-04-18 NOTE — OR Nursing (Signed)
Pt is awake,alert and oriented.Pt and/or family verbalized understanding of poc and discharge instructions. Reviewed admission and on going care with receiving RN. Pt is in NAD at this time and is ready to be transferred to floor. Will con't to monitor until pt is transferred. Belongings on bed with patient Report given to [facility]

## 2020-04-19 ENCOUNTER — Encounter (HOSPITAL_COMMUNITY): Payer: Self-pay | Admitting: Orthopedic Surgery

## 2020-04-19 DIAGNOSIS — J449 Chronic obstructive pulmonary disease, unspecified: Secondary | ICD-10-CM | POA: Diagnosis not present

## 2020-04-19 DIAGNOSIS — F17210 Nicotine dependence, cigarettes, uncomplicated: Secondary | ICD-10-CM | POA: Diagnosis not present

## 2020-04-19 DIAGNOSIS — Z7982 Long term (current) use of aspirin: Secondary | ICD-10-CM | POA: Diagnosis not present

## 2020-04-19 DIAGNOSIS — M1712 Unilateral primary osteoarthritis, left knee: Secondary | ICD-10-CM | POA: Diagnosis not present

## 2020-04-19 DIAGNOSIS — Z7984 Long term (current) use of oral hypoglycemic drugs: Secondary | ICD-10-CM | POA: Diagnosis not present

## 2020-04-19 DIAGNOSIS — E1159 Type 2 diabetes mellitus with other circulatory complications: Secondary | ICD-10-CM | POA: Diagnosis not present

## 2020-04-19 DIAGNOSIS — I10 Essential (primary) hypertension: Secondary | ICD-10-CM | POA: Diagnosis not present

## 2020-04-19 DIAGNOSIS — Z79899 Other long term (current) drug therapy: Secondary | ICD-10-CM | POA: Diagnosis not present

## 2020-04-19 NOTE — Progress Notes (Signed)
Physical Therapy Treatment Patient Details Name: [name] MRN: [id number] DOB: 1950-10-30 Today's Date: 04/19/2020    History of Present Illness Pt is a 69 y/o female s/p L TKA. PMH includes COPD, CAD, DM, HTN, and PVD.     PT Comments    Pt making good progress toward functional mobility goals, able to initiate stair training using bilateral handrails and minA, but significantly fatigued after performing 5 steps and unable to perform more. Pt reports she will have 2 flights of steps to enter apartment. Pt performing transfers with min guard assist to RW and bed mobility with minA and no rails, slightly elevated HOB (pt reports she will be sleeping on couch initially).  Pt continues to benefit from PT services to progress toward functional mobility goals. D/C recs below remain appropriate, pending progress. Pt would benefit from 1-2 more sessions to progress functional mobility, gait tolerance and stair training.  Follow Up Recommendations  Follow surgeons recommendation for DC plan and follow-up therapies     Equipment Recommendations  Rolling walker with 5" wheels;3in1 (PT);Other (comment) (tub transfer bench)    Recommendations for Other Services       Precautions / Restrictions Precautions Precautions: Knee Precaution Booklet Issued: Yes (comment) Precaution Comments: knee precs and HEP handout given Restrictions Weight Bearing Restrictions: Yes LLE Weight Bearing: Weight bearing as tolerated    Mobility  Bed Mobility Overal bed mobility: Needs Assistance Bed Mobility: Sit to Supine       Sit to supine: Min assist   General bed mobility comments: cues for hooking RLE under painful leg and technique to perform without use of bed rails  Transfers Overall transfer level: Needs assistance Equipment used: Rolling walker (2 wheeled) Transfers: Sit to/from Stand Sit to Stand: Min guard         General transfer comment: from chair and toilet heights to  RW, cues for UE placement and sequencing to reduce pain  Ambulation/Gait Ambulation/Gait assistance: Min guard Gait Distance (Feet): 20 Feet (48ft x3, 51ft with seated breaks between) Assistive device: Rolling walker (2 wheeled) Gait Pattern/deviations: Decreased step length - right;Decreased step length - left;Step-to pattern;Shuffle;Antalgic Gait velocity: decreased   General Gait Details: heavy BUE reliance on RW to offload LLE, pt with limited weight acceptance through L heel/toe, encouraged to attempt heel to toe WB pattern, pt tending to TTWB   Stairs Stairs: Yes Stairs assistance: Min assist Stair Management: Two rails Number of Stairs: 5 General stair comments: pt given verbal/visual demo prior to ascent, pt able to perform 5 steps in PT gym with step-to gait pattern, dyspneic on exertion however SpO2 WNL and HR ~113 with activity. Sweating/fatigued but denies dizziness. Pt will have 2 flights of steps to enter apartment.   Wheelchair Mobility    Modified Rankin (Stroke Patients Only)       Balance Overall balance assessment: Needs assistance Sitting-balance support: No upper extremity supported;Feet supported Sitting balance-Leahy Scale: Good     Standing balance support: Bilateral upper extremity supported;During functional activity Standing balance-Leahy Scale: Poor Standing balance comment: reliant on UE support to offload from painful L LE but able to static stand to don mask unsupported no LOB                            Cognition Arousal/Alertness: Awake/alert Behavior During Therapy: WFL for tasks assessed/performed Overall Cognitive Status: Within Functional Limits for tasks assessed  General Comments: pleasantly cooperative      Exercises      General Comments        Pertinent Vitals/Pain Pain Assessment: 0-10 Pain Score: 5  Pain Location: L knee during mobility tasks, seems to increase  with transfers Pain Descriptors / Indicators: Aching;Operative site guarding Pain Intervention(s): Monitored during session;Repositioned (pt defers ice after PM session, encouraged pt to use later)  SpO2 94% on RA with mobility and HR to 113 bpm during stair trial.  Home Living                      Prior Function            PT Goals (current goals can now be found in the care plan section) Acute Rehab PT Goals Patient Stated Goal: decrease pain, go home safely PT Goal Formulation: With patient Time For Goal Achievement: 05/02/20 Potential to Achieve Goals: Fair Progress towards PT goals: Progressing toward goals    Frequency    7X/week      PT Plan Current plan remains appropriate    Co-evaluation              AM-PAC PT "6 Clicks" Mobility   Outcome Measure  Help needed turning from your back to your side while in a flat bed without using bedrails?: A Little Help needed moving from lying on your back to sitting on the side of a flat bed without using bedrails?: A Little Help needed moving to and from a bed to a chair (including a wheelchair)?: A Little Help needed standing up from a chair using your arms (e.g., wheelchair or bedside chair)?: A Little Help needed to walk in hospital room?: A Little Help needed climbing 3-5 steps with a railing? : Total 6 Click Score: 16    End of Session Equipment Utilized During Treatment: Gait belt Activity Tolerance: Patient tolerated treatment well;Patient limited by pain Patient left: in bed;with bed alarm set;with call bell/phone within reach Nurse Communication: Mobility status PT Visit Diagnosis: Other abnormalities of gait and mobility (R26.89);Pain Pain - Right/Left: Left Pain - part of body: Knee     Time: 1410-1445 PT Time Calculation (min) (ACUTE ONLY): 35 min  Charges:  $Gait Training: 8-22 mins $Therapeutic Activity: 8-22 mins                     [name] P., PTA Acute Rehabilitation  Services Pager: [phone] Office: [place] 04/19/2020, 4:42 PM

## 2020-04-19 NOTE — Progress Notes (Signed)
  Subjective: [name] is a 69 y.o. female s/p left TKA.  They are POD1.  Pt's pain is controlled but moderate in intensity.  Pt denies numbness/tingling/weakness.  Pt has ambulated with some difficulty. She was able to ambulate about 10 feet. Lives on 3rd floor in apartment without elevator     Objective: Vital signs in last 24 hours: Temp:  [97.5 F (36.4 C)-98.7 F (37.1 C)] 98.7 F (37.1 C) (10/27 0426) Pulse Rate:  [45-70] 70 (10/27 0426) Resp:  [15-20] 18 (10/27 0426) BP: (122-178)/(57-94) 173/76 (10/27 0426) SpO2:  [93 %-98 %] 95 % (10/27 0426)  Intake/Output from previous day: 10/26 0701 - 10/27 0700 In: 1800 [P.O.:500; I.V.:1300] Out: 200 [Urine:150; Blood:50] Intake/Output this shift: No intake/output data recorded.  Exam:  No gross blood or drainage overlying the dressing 2+ DP pulse Sensation intact distally in the left foot Able to dorsiflex and plantarflex the left foot   Labs: No results for input(s): HGB in the last 72 hours. No results for input(s): WBC, RBC, HCT, PLT in the last 72 hours. No results for input(s): NA, K, CL, CO2, BUN, CREATININE, GLUCOSE, CALCIUM in the last 72 hours. No results for input(s): LABPT, INR in the last 72 hours.  Assessment/Plan: Pt is POD1 s/p left TKA.    -Plan to discharge to home  tomorrow or next day pending patient's pain and PT eval  -WBAT with a walker   Javionna Leder L Kahmari Koller 04/19/2020, 8:13 AM

## 2020-04-19 NOTE — Care Plan (Signed)
[facility] office RNCM met with patient today in hospital. Her son was at bedside. Also present was [employer] rep, who delivered her FWW to her today. He discussed with her that he would be bringing her home CPM as well as 3in1/BSC to her home after discharge. RNCM provided written post-op instructions that were previously reviewed prior to surgery for her reference. Discussed if there was another family member or friend that she could stay with after discharge instead of her 3rd level apartment. She states that all family have small children and limited space. Discussed ambulance transport home if going to her apartment at discharge. Also, CM discussed with OT this morning that they are recommending HHOT as well as a tub transfer bench for safe bathing/dressing at home. Discussed with Dr. Marlou Sa and will have these added to DME and [facility] orders. Please contact Jamse Arn, RN Case Manager for questions or needs. [phone].

## 2020-04-19 NOTE — Progress Notes (Addendum)
Physical Therapy Treatment Patient Details Name: [name] MRN: [id number] DOB: [birth year] Today's Date: 04/19/2020    History of Present Illness Pt is a 68 y/o female s/p L TKA. PMH includes COPD, CAD, DM, HTN, and PVD.     PT Comments    Pt up in chair on arrival to room, c/o severe L knee pain but agreeable to therapy session after getting more pain meds. RN notified, pt reports improved pain score after RN gave dilaudid prior to gait trial. Pt performed supine BLE A/AAROM therapeutic exercises below with good tolerance, given HEP handout and encouraged to perform TID as able, of note L knee ROM limited in supine due to pain, ~5 deg to 35 deg, improved ROM once seated up in chair with LLE in dependent position. Pt progressed gait distance to ~124ft total using RW and min guard assist, but needs increased time and encouragement to initiate and perform mobility tasks, and is unable to bear full weight on LLE. Pt also stood from chair and toilet heights to RW with minA, pt deferred bed mobility and steps in AM session, with plan to attempt in PM session. Pt continues to benefit from PT services to progress toward functional mobility goals. Anticipate pt will need at least 1-2 more sessions prior to D/C, D/C recs as below remain appropriate.   Follow Up Recommendations  Follow surgeon's recommendation for DC plan and follow-up therapies     Equipment Recommendations  Rolling walker with 5" wheels;3in1 (PT);Other (comment) (Tub transfer bench)    Recommendations for Other Services       Precautions / Restrictions Precautions Precautions: Knee Precaution Booklet Issued: Yes (comment) Precaution Comments: knee precs and HEP handout given Restrictions Weight Bearing Restrictions: Yes LLE Weight Bearing: Weight bearing as tolerated    Mobility  Bed Mobility                  Transfers Overall transfer level: Needs assistance Equipment used: Rolling walker (2  wheeled) Transfers: Sit to/from Stand Sit to Stand: Min guard         General transfer comment: from chair and toilet heights to RW, cues for UE placement and sequencing to reduce pain  Ambulation/Gait Ambulation/Gait assistance: Min guard Gait Distance (Feet): 100 Feet Assistive device: Rolling walker (2 wheeled) Gait Pattern/deviations: Decreased step length - right;Decreased step length - left;Step-to pattern;Shuffle;Antalgic Gait velocity: decreased Gait velocity interpretation: <1.8 ft/sec, indicate of risk for recurrent falls General Gait Details: heavy BUE reliance on RW to offload LLE, pt with limited weight acceptance through L heel/toe, encouraged to attempt heel to toe WB pattern, pt tending to [employer]   Stairs             Wheelchair Mobility    Modified Rankin (Stroke Patients Only)       Balance Overall balance assessment: Needs assistance Sitting-balance support: No upper extremity supported;Feet supported Sitting balance-Leahy Scale: Good     Standing balance support: Bilateral upper extremity supported;During functional activity Standing balance-Leahy Scale: Poor Standing balance comment: reliant on UE support to offload from painful L LE                            Cognition Arousal/Alertness: Awake/alert Behavior During Therapy: [facility] for tasks assessed/performed;Anxious Overall Cognitive Status: Within Functional Limits for tasks assessed  General Comments: pleasantly cooperative      Exercises Total Joint Exercises Ankle Circles/Pumps: AROM;Strengthening;Both;15 reps;Supine Quad Sets: AROM;Both;10 reps;Supine Short Arc Quad: AROM;Left;10 reps;Supine Heel Slides: AAROM;Left;10 reps;Supine Hip ABduction/ADduction: AROM;Left;10 reps;Supine Straight Leg Raises: AAROM;Left;5 reps;Supine Goniometric ROM: grossly 5 deg to 35 deg in supine, increased knee flexion when seated upright     General Comments General comments (skin integrity, edema, etc.): significant swelling to L knee, pt instructed on donning ice 30 mins on/off throughout day to reduce swelling and not to place pillow under knee to prevent contracture      Pertinent Vitals/Pain Pain Assessment: 0-10 Pain Score: 3  Pain Location: L knee after dilaudid IV given, during gait; increased during supine HEP Pain Descriptors / Indicators: Aching;Operative site guarding Pain Intervention(s): Monitored during session;Premedicated before session;Repositioned;RN gave pain meds during session;Ice applied    Home Living                      Prior Function            PT Goals (current goals can now be found in the care plan section) Acute Rehab PT Goals Patient Stated Goal: decrease pain, go home safely PT Goal Formulation: With patient Time For Goal Achievement: 05/02/20 Potential to Achieve Goals: Fair Progress towards PT goals: Progressing toward goals    Frequency    7X/week      PT Plan Current plan remains appropriate    Co-evaluation              AM-PAC PT "6 Clicks" Mobility   Outcome Measure  Help needed turning from your back to your side while in a flat bed without using bedrails?: A Little Help needed moving from lying on your back to sitting on the side of a flat bed without using bedrails?: A Little Help needed moving to and from a bed to a chair (including a wheelchair)?: A Little Help needed standing up from a chair using your arms (e.g., wheelchair or bedside chair)?: A Little Help needed to walk in hospital room?: A Little Help needed climbing 3-5 steps with a railing? : Total 6 Click Score: 16    End of Session Equipment Utilized During Treatment: Gait belt Activity Tolerance: Patient tolerated treatment well;Patient limited by pain Patient left: in chair;with call bell/phone within reach (pt given ice pack) Nurse Communication: Mobility status PT Visit  Diagnosis: Other abnormalities of gait and mobility (R26.89);Pain Pain - Right/Left: Left Pain - part of body: Knee     Time: 1057-1140 PT Time Calculation (min) (ACUTE ONLY): 43 min  Charges:  $Gait Training: 8-22 mins $Therapeutic Exercise: 8-22 mins $Therapeutic Activity: 8-22 mins                     Hagan Maltz P., PTA Acute Rehabilitation Services Pager: [phone] Office: [place] 04/19/2020, 12:56 PM

## 2020-04-19 NOTE — Evaluation (Signed)
Occupational Therapy Evaluation Patient Details Name: [name] MRN: [id number] DOB: Feb 17, 1951 Today's Date: 04/19/2020    History of Present Illness Pt is a 69 y/o female s/p L TKA. PMH includes COPD, CAD, DM, HTN, and PVD.    Clinical Impression   PTA, pt lives alone and completely independent in all daily tasks with occasional use of a cane. Pt presents now with deficits in balance, L LE strength, and endurance. With increased time and effort, pt able to demo short distance mobility (about 73ft) with RW. Pt also requires Setup for UB ADLs and Min A for LB ADLs due to difficulty managing around operative LE. Pt reports she has a tub/shower unit - recommended tub transfer bench due to difficulty lifting L LE at this time. Plan to trial tub bench and AE for LB ADLs to maximize ADL independence. Recommend HHOT follow-up at home along with HHPT to further address ADLs/IADLs in the home.   Also, noted that pt lives in 3rd floor apartment without elevator. Recommend pt to stay with family members that have less steps if possible or ambulance transport home.    Follow Up Recommendations  Home health OT;Supervision/Assistance - 24 hour    Equipment Recommendations  3 in 1 bedside commode;Tub/shower bench;Other (comment) (RW)    Recommendations for Other Services       Precautions / Restrictions Precautions Precautions: Knee Precaution Booklet Issued: No Restrictions Weight Bearing Restrictions: Yes LLE Weight Bearing: Weight bearing as tolerated      Mobility Bed Mobility Overal bed mobility: Needs Assistance Bed Mobility: Supine to Sit     Supine to sit: Min assist     General bed mobility comments: Min A for LLE advancement    Transfers Overall transfer level: Needs assistance Equipment used: Rolling walker (2 wheeled) Transfers: Sit to/from [employer] Sit to Stand: Min guard Stand pivot transfers: Min guard       General transfer  comment: min guard for safety with increased time and effort, cues for hand placement    Balance Overall balance assessment: Needs assistance Sitting-balance support: No upper extremity supported;Feet supported Sitting balance-Leahy Scale: Good     Standing balance support: Bilateral upper extremity supported;During functional activity Standing balance-Leahy Scale: Poor Standing balance comment: reliant on UE support to offload from painful L LE                           ADL either performed or assessed with clinical judgement   ADL Overall ADL's : Needs assistance/impaired Eating/Feeding: Independent;Sitting   Grooming: Set up;Wash/dry face;Sitting   Upper Body Bathing: Set up;Sitting   Lower Body Bathing: Minimal assistance;Sit to/from stand   Upper Body Dressing : Set up;Sitting   Lower Body Dressing: Minimal assistance;Sit to/from stand Lower Body Dressing Details (indicate cue type and reason): Requires assist for donning around L LE. Educated on use of Writer: Min guard;Ambulation;RW;BSC Toilet Transfer Details (indicate cue type and reason): slow, steady pace, transferred [facility] x 2 Toileting- Clothing Manipulation and Hygiene: Min guard;Sit to/from stand Toileting - Clothing Manipulation Details (indicate cue type and reason): min guard for safety, clothing mgmt     Functional mobility during ADLs: Min guard;Cueing for sequencing;Rolling walker General ADL Comments: Limitations secondary to pain and difficulty managing ADLs around L foot     Vision Baseline Vision/History: No visual deficits Vision Assessment?: No apparent visual deficits     Perception     Praxis  Pertinent Vitals/Pain Pain Assessment: 0-10 Pain Score: 9  Pain Location: L knee  Pain Descriptors / Indicators: Aching;Operative site guarding Pain Intervention(s): Limited activity within patient's tolerance;Monitored during session;Premedicated before  session;Repositioned     Hand Dominance Right   Extremity/Trunk Assessment Upper Extremity Assessment Upper Extremity Assessment: Overall WFL for tasks assessed   Lower Extremity Assessment Lower Extremity Assessment: Defer to PT evaluation   Cervical / Trunk Assessment Cervical / Trunk Assessment: Normal   Communication Communication Communication: No difficulties   Cognition Arousal/Alertness: Awake/alert Behavior During Therapy: WFL for tasks assessed/performed Overall Cognitive Status: Within Functional Limits for tasks assessed                                     General Comments  PA in to assess pt, removed bandaging. Discussed need for tub bench due to difficulty lifting LE over tub. Recommended ambulance transport home due to 3rd floor apartment with pt agreeable    Exercises     Shoulder Instructions      Home Living Family/patient expects to be discharged to:: Private residence Living Arrangements: Alone Available Help at Discharge: Family;Available PRN/intermittently Type of Home: Apartment Home Access: Stairs to enter [employer] of Steps: 2 flights Entrance Stairs-Rails: Right;Left;Can reach both Home Layout: One level     Bathroom Shower/Tub: Teacher, early years/pre: Standard     Home Equipment: Shower seat;Cane - single point;Crutches          Prior Functioning/Environment Level of Independence: Independent with assistive device(s)        Comments: Used cane occasionally        OT Problem List: Decreased strength;Decreased activity tolerance;Impaired balance (sitting and/or standing);Decreased knowledge of use of DME or AE;Pain      OT Treatment/Interventions: Self-care/ADL training;Therapeutic exercise;DME and/or AE instruction;Therapeutic activities;Patient/family education    OT Goals(Current goals can be found in the care plan section) Acute Rehab OT Goals Patient Stated Goal: decrease pain, go  home safely OT Goal Formulation: With patient Time For Goal Achievement: 05/03/20 Potential to Achieve Goals: Good ADL Goals Pt Will Perform Grooming: with modified independence;standing Pt Will Perform Lower Body Dressing: with modified independence;with adaptive equipment;sit to/from stand;sitting/lateral leans Pt Will Perform Tub/Shower Transfer: Tub transfer;with modified independence;ambulating;tub bench;rolling walker  OT Frequency: Min 2X/week   Barriers to D/C:            Co-evaluation              AM-PAC OT "6 Clicks" Daily Activity     Outcome Measure Help from another person eating meals?: None Help from another person taking care of personal grooming?: A Little Help from another person toileting, which includes using toliet, bedpan, or urinal?: A Little Help from another person bathing (including washing, rinsing, drying)?: A Little Help from another person to put on and taking off regular upper body clothing?: A Little Help from another person to put on and taking off regular lower body clothing?: A Little 6 Click Score: 19   End of Session Equipment Utilized During Treatment: Gait belt;Rolling walker CPM Left Knee CPM Left Knee: Off Nurse Communication: Mobility status  Activity Tolerance: Patient tolerated treatment well;Patient limited by pain Patient left: in chair;with call bell/phone within reach;Other (comment) (chair pad in chair, but not alarm box in room. RN notified)  OT Visit Diagnosis: Unsteadiness on feet (R26.81);Other abnormalities of gait and mobility (R26.89);Muscle weakness (generalized) (M62.81);Pain  Pain - Right/Left: Left Pain - part of body: Knee                Time: 9437-0052 OT Time Calculation (min): 50 min Charges:  OT General Charges $OT Visit: 1 Visit OT Evaluation $OT Eval Low Complexity: 1 Low OT Treatments $Self Care/Home Management : 8-22 mins $Therapeutic Activity: 8-22 mins  Layla Maw, OTR/L  Layla Maw 04/19/2020, 8:46 AM

## 2020-04-19 NOTE — Anesthesia Postprocedure Evaluation (Signed)
Anesthesia Post Note  Patient: [name]  Procedure(s) Performed: LEFT TOTAL KNEE ARTHROPLASTY (Left Knee)     Patient location during evaluation: PACU Anesthesia Type: Regional, MAC and Spinal Level of consciousness: awake and alert Pain management: pain level controlled Vital Signs Assessment: post-procedure vital signs reviewed and stable Respiratory status: spontaneous breathing, nonlabored ventilation, respiratory function stable and patient connected to nasal cannula oxygen Cardiovascular status: stable and blood pressure returned to baseline Postop Assessment: no apparent nausea or vomiting and spinal receding Anesthetic complications: no   No complications documented.  Last Vitals:  Vitals:   04/19/20 0426 04/19/20 0809  BP: (!) 173/76 136/72  Pulse: 70 68  Resp: 18 18  Temp: 37.1 C 36.7 C  SpO2: 95%     Last Pain:  Vitals:   04/19/20 1137  TempSrc:   PainSc: 4                  Heylee Tant

## 2020-04-19 NOTE — Op Note (Signed)
NAME: [name], [name] MEDICAL RECORD PF:[id number] ACCOUNT [id number] DATE OF BIRTH:1951/05/19 FACILITY: [facility] LOCATION: [facility] PHYSICIAN:Kiwana Deblasi Randel Pigg, MD  OPERATIVE REPORT  DATE OF PROCEDURE:  04/18/2020  PREOPERATIVE DIAGNOSIS:  Left knee arthritis.  POSTOPERATIVE DIAGNOSIS:  Left knee arthritis.  PROCEDURE:  Left total knee replacement using Stryker Triathlon press-fit knee, size 3 femur posterior cruciate retaining, 3 tibia, 11 mm deep dish polyethylene insert and 32 mm 3-peg press-fit patella.  SURGEON:  Meredith Pel, MD  ASSISTANT:  Annie Main, PA.  INDICATIONS:  The patient is a 69 year old patient with end-stage left knee arthritis, who presents for operative management after explanation of risks and benefits.  PROCEDURE IN DETAIL:  The patient was brought to the operating room where spinal anesthetic was induced.  Preoperative antibiotics administered.  Timeout was called.  Left leg prescrubbed with alcohol and Betadine, allowed to air dry.  Prep with DuraPrep  solution and draped in a sterile manner.  Ioban used to cover the operative field.  The leg was elevated, exsanguinated using the Esmarch wrap, tourniquet inflated to 300 mmHg for a total tourniquet time of 65 minutes.  Anterior approach to the knee was  made.  Skin and subcutaneous tissue were sharply divided.  Median parapatellar approach was made.  IrriSept solution used after the incision as well as after the arthrotomy.  The precise location of the arthrotomy marked with #1 Vicryl suture.  Fat pad  partially excised.  Soft tissue removed from the anterior distal femur.  Lateral patellofemoral ligament released.  Minimal soft tissue dissection was performed on the medial side of the knee.  Patella was everted.  Anterior horn on the lateral meniscus  was resected.  ACL removed.  With a cut measuring 9 mm off the least affected lateral tibial plateau was made with the collaterals protected and posterior  neurovascular structures protected, perpendicular to mechanical axis.  Attention was then directed  towards the femur.  An 8 mm cut then made in 5 degrees of valgus.  This gave full extension with both a 9 and 11 mm spacer.  The femur was sized to a size 3  Tibia was a size 3.  Anterior, posterior chamfer cuts were made.  Flexion and extension gaps  were symmetric.  The tibia was then keel punched with appropriate rotation.  Patella then cut from 24 to 14.  3-peg patellar button placed with a 9 and 11 mm spacer placed and with the femoral component trial placed.  The patient had full extension with  an 11 mm spacer and good stability to varus and valgus stress with 0, 30, and 90 degrees of flexion.  Slight lateral patellar maltracking was present, which required very small lateral release.  This improved tracking.  The trial components were removed.   Thorough irrigation was performed.  Solution of tranexamic acid and IrriSept solution, then placed for 3 minutes into the incision.  These were then removed.  Capsule anesthetized using Marcaine, Exparel and saline.  Next, true components placed with  same stability parameters maintained.  Excellent tracking using no thumbs technique was present.  A tourniquet released.  Bleeding points encountered controlled using electrocautery.  Pouring irrigation then utilized x3 liters and then IrriSept solution  and Vancomycin powder placed.  The arthrotomy closed over a bolster using #1 Vicryl suture followed by interrupted inverted 0 Vicryl suture, IrriSept solution, vancomycin powder and 2-0 Vicryl and 3-0 Monocryl.  Steri-Strips, Aquacel dressing placed.   The patient tolerated the procedure well without  immediate complications.  Luke's assistance was required at all times for retraction, opening and closing.  His assistance was medical necessity.  HN/NUANCE  D:04/18/2020 T:04/18/2020 JOB:[id number]

## 2020-04-20 DIAGNOSIS — Z7984 Long term (current) use of oral hypoglycemic drugs: Secondary | ICD-10-CM | POA: Diagnosis not present

## 2020-04-20 DIAGNOSIS — R5381 Other malaise: Secondary | ICD-10-CM | POA: Diagnosis not present

## 2020-04-20 DIAGNOSIS — Z743 Need for continuous supervision: Secondary | ICD-10-CM | POA: Diagnosis not present

## 2020-04-20 DIAGNOSIS — Z7401 Bed confinement status: Secondary | ICD-10-CM | POA: Diagnosis not present

## 2020-04-20 DIAGNOSIS — M2550 Pain in unspecified joint: Secondary | ICD-10-CM | POA: Diagnosis not present

## 2020-04-20 DIAGNOSIS — M1712 Unilateral primary osteoarthritis, left knee: Secondary | ICD-10-CM | POA: Diagnosis not present

## 2020-04-20 DIAGNOSIS — E1159 Type 2 diabetes mellitus with other circulatory complications: Secondary | ICD-10-CM | POA: Diagnosis not present

## 2020-04-20 DIAGNOSIS — F17210 Nicotine dependence, cigarettes, uncomplicated: Secondary | ICD-10-CM | POA: Diagnosis not present

## 2020-04-20 DIAGNOSIS — J449 Chronic obstructive pulmonary disease, unspecified: Secondary | ICD-10-CM | POA: Diagnosis not present

## 2020-04-20 DIAGNOSIS — Z79899 Other long term (current) drug therapy: Secondary | ICD-10-CM | POA: Diagnosis not present

## 2020-04-20 DIAGNOSIS — Z7982 Long term (current) use of aspirin: Secondary | ICD-10-CM | POA: Diagnosis not present

## 2020-04-20 DIAGNOSIS — I10 Essential (primary) hypertension: Secondary | ICD-10-CM | POA: Diagnosis not present

## 2020-04-20 MED ORDER — METHOCARBAMOL 500 MG PO TABS: 500.0000 mg | ORAL_TABLET | Freq: Three times a day (TID) | ORAL | 0 refills | Status: DC | PRN

## 2020-04-20 MED ORDER — OXYCODONE HCL 5 MG PO TABS: 5.0000 mg | ORAL_TABLET | ORAL | 0 refills | Status: DC | PRN

## 2020-04-20 NOTE — Progress Notes (Signed)
Discharge instructions addressed; pt in stable condition; [facility] here to transport pt home with rolling walker; bone foam & knee immobilizer.

## 2020-04-20 NOTE — Progress Notes (Signed)
  Subjective: Creedence Heiss is a 69 y.o. female s/p left TKA.  They are POD2.  Pt's pain is controlled and much improved compared with yesterday.  Pt has ambulated with some difficulty.  Was able to do stairs with PT this morning. Feels ready for discharge home   Objective: Vital signs in last 24 hours: Temp:  [97.8 F (36.6 C)-99.4 F (37.4 C)] 99.4 F (37.4 C) (10/28 0738) Pulse Rate:  [70-91] 89 (10/28 0738) Resp:  [17-19] 17 (10/28 0738) BP: (131-138)/(65-71) 131/71 (10/28 0738) SpO2:  [94 %-99 %] 97 % (10/28 0738)  Intake/Output from previous day: 10/27 0701 - 10/28 0700 In: 720 [P.O.:720] Out: -  Intake/Output this shift: Total I/O In: 240 [P.O.:240] Out: 200 [Urine:200]  Exam:  No gross blood or drainage overlying the dressing 2+ DP pulse Sensation intact distally in the left foot Able to dorsiflex and plantarflex the left foot   Labs: No results for input(s): HGB in the last 72 hours. No results for input(s): WBC, RBC, HCT, PLT in the last 72 hours. No results for input(s): NA, K, CL, CO2, BUN, CREATININE, GLUCOSE, CALCIUM in the last 72 hours. No results for input(s): LABPT, INR in the last 72 hours.  Assessment/Plan: Pt is POD2 s/p left TKA.    -Plan to discharge to home today. Lives on 3rd floor, needs ambulance discharge  -WBAT with a walker  -F/u with Dr. Marlou Sa in ~2 weeks on 11/10   Gerrianne Scale Troy Kanouse 04/20/2020, 12:08 PM

## 2020-04-20 NOTE — Progress Notes (Signed)
Physical Therapy Treatment Patient Details Name: [name] MRN: [id number] DOB: 1950/11/25 Today's Date: 04/20/2020    History of Present Illness Pt is a 69 y/o female s/p L TKA. PMH includes COPD, CAD, DM, HTN, PVD.    PT Comments    Pt progressing with mobility. This morning's session focused on stair training (as pt has 2 flights to enter her apartment). Pt only able to tolerate ascending/descending 6 steps, heavy reliance on rail support with min guard for balance; pt SOB and sweating, requiring seated rest before further mobility attempts. Discussed continued recommendation for ambulance transport home to safely get into 3rd floor apartment. Will plan for additional session this afternoon prior to d/c.    Follow Up Recommendations  Follow surgeon's recommendation for DC plan and follow-up therapies;Home health PT;Supervision for mobility/OOB     Equipment Recommendations  Rolling walker with 5" wheels;3in1 (PT);Other (comment) (tub bench)    Recommendations for Other Services       Precautions / Restrictions Precautions Precautions: Knee;Fall Precaution Booklet Issued: Yes (comment) Restrictions Weight Bearing Restrictions: Yes LLE Weight Bearing: Weight bearing as tolerated    Mobility  Bed Mobility Overal bed mobility: Needs Assistance Bed Mobility: Supine to Sit     Supine to sit: Supervision;HOB elevated     General bed mobility comments: Received sitting in recliner  Transfers Overall transfer level: Needs assistance Equipment used: Rolling walker (2 wheeled) Transfers: Sit to/from Stand Sit to Stand: Min guard Stand pivot transfers: Min guard       General transfer comment: Initial cues for hand placement standing to RW, pt with good carryover for subsequent trials; min guard for balance, increased time and effort secondary to pain  Ambulation/Gait Ambulation/Gait assistance: Min guard Gait Distance (Feet): 40 Feet Assistive device:  Rolling walker (2 wheeled) Gait Pattern/deviations: Step-through pattern;Decreased stride length;Decreased weight shift to left;Trunk flexed;Antalgic Gait velocity: Decreased   General Gait Details: Slow, antalgic gait with RW and intermittent min guard for balance; improving WBAT through LLE with intermitent cues to increased this with heel-to-toe gait pattern; pt reports pain with full knee extension wanting to keep it slightly flexed   Stairs Stairs: Yes Stairs assistance: Min guard Stair Management: One rail Left;Step to pattern;Sideways;Backwards Number of Stairs: 6 General stair comments: Ascended 6 steps with BUE support on L-side rail, partially sideways to allow for increased BUE support, close min guard for balance; descended steps backwards with same BUE support on L-rail, pt reports more secure in this method than forwards down steps; DOE, fatigue and sweating; required seated rest break after 6 steps   Wheelchair Mobility    Modified Rankin (Stroke Patients Only)       Balance Overall balance assessment: Needs assistance Sitting-balance support: No upper extremity supported;Feet supported Sitting balance-Leahy Scale: Good     Standing balance support: Bilateral upper extremity supported;During functional activity;Single extremity supported Standing balance-Leahy Scale: Poor Standing balance comment: Reliant on at least single UE support in standing, decreased WBAT through LLE in static standing                            Cognition Arousal/Alertness: Awake/alert Behavior During Therapy: WFL for tasks assessed/performed Overall Cognitive Status: Within Functional Limits for tasks assessed  Exercises      General Comments        Pertinent Vitals/Pain Pain Assessment: Faces Pain Score: 5  Faces Pain Scale: Hurts little more Pain Location: L knee Pain Descriptors / Indicators: Aching;Operative  site guarding Pain Intervention(s): Monitored during session    Home Living                      Prior Function            PT Goals (current goals can now be found in the care plan section) Acute Rehab PT Goals Patient Stated Goal: decrease pain, go home safely Progress towards PT goals: Progressing toward goals    Frequency    7X/week      PT Plan Current plan remains appropriate    Co-evaluation              AM-PAC PT "6 Clicks" Mobility   Outcome Measure  Help needed turning from your back to your side while in a flat bed without using bedrails?: A Little Help needed moving from lying on your back to sitting on the side of a flat bed without using bedrails?: A Little Help needed moving to and from a bed to a chair (including a wheelchair)?: A Little Help needed standing up from a chair using your arms (e.g., wheelchair or bedside chair)?: A Little Help needed to walk in hospital room?: A Little Help needed climbing 3-5 steps with a railing? : A Little 6 Click Score: 18    End of Session Equipment Utilized During Treatment: Gait belt Activity Tolerance: Patient tolerated treatment well;Patient limited by fatigue Patient left: in chair;with call bell/phone within reach;with chair alarm set Nurse Communication: Mobility status PT Visit Diagnosis: Other abnormalities of gait and mobility (R26.89);Pain Pain - Right/Left: Left Pain - part of body: Knee     Time: 2751-7001 PT Time Calculation (min) (ACUTE ONLY): 24 min  Charges:  $Gait Training: 23-37 mins                     Mabeline Caras, PT, DPT Acute Rehabilitation Services  Pager [phone] Office [place] 04/20/2020, 1:34 PM

## 2020-04-20 NOTE — Progress Notes (Signed)
Occupational Therapy Treatment Patient Details Name: [name] MRN: [id number] DOB: 1951-04-22 Today's Date: 04/20/2020    History of present illness Pt is a 69 y/o female s/p L TKA. PMH includes COPD, CAD, DM, HTN, and PVD.    OT comments  Pt progressing towards OT goals with session focusing on tub bench transfers and LB ADL management with operative LE. After demonstration of tub bench transfer, pt able to ambulate to bench with RW and min guard. However, pt with difficulty managing either LE over simulated tub height (pt reports her tub at home is even higher), requiring Mod A to safely transfer in tub. Strongly recommend tub transfer bench at home to decrease fall risk as pt unable to lift L LE high enough to clear tub at this time. With use of AE and compensatory strategies, pt able to demo LB Dressing with supervision. Continue to recommend [place] follow-up and ambulance transport home as well.  Follow Up Recommendations  Home health OT    Equipment Recommendations  3 in 1 bedside commode;Tub/shower bench;Other (comment) (RW)    Recommendations for Other Services      Precautions / Restrictions Precautions Precautions: Knee Precaution Booklet Issued: Yes (comment) Restrictions Weight Bearing Restrictions: Yes LLE Weight Bearing: Weight bearing as tolerated       Mobility Bed Mobility Overal bed mobility: Needs Assistance Bed Mobility: Supine to Sit     Supine to sit: Supervision;HOB elevated     General bed mobility comments: Supervision, no assist needed  Transfers Overall transfer level: Needs assistance Equipment used: Rolling walker (2 wheeled) Transfers: Sit to/from [facility] Sit to Stand: Min guard Stand pivot transfers: Min guard       General transfer comment: min guard for safety, cues for hand placement but no physical assist needed    Balance Overall balance assessment: Needs assistance Sitting-balance support: No  upper extremity supported;Feet supported Sitting balance-Leahy Scale: Good     Standing balance support: Bilateral upper extremity supported;During functional activity Standing balance-Leahy Scale: Poor Standing balance comment: reliant on UE support to offload from painful L LE but able to static stand to don mask unsupported no LOB                           ADL either performed or assessed with clinical judgement   ADL Overall ADL's : Needs assistance/impaired                     Lower Body Dressing: Minimal assistance;Sit to/from stand Lower Body Dressing Details (indicate cue type and reason): Requires assist to don around L foot. Pt reports plan to wear gown, underwear and slip on shoes at home. Educated on use of reacher for donning around L foot with pt able to progress from [place] A to supervision for task with AE. Educated pt that she can also use her cane at home in same fashion to don pants.         Tub/ Shower Transfer: Moderate assistance;Cueing for safety;Cueing for sequencing;Ambulation;Tub bench;Rolling walker Tub/Shower Transfer Details (indicate cue type and reason): Guided pt in tub transfer bench use with simulation over tub. Pt overall Mod A for this task. Difficulty scooting back on bench and advancing either LE over tub. Educated pt need for tub bench to ensure safety and to have someone present for first few showering tasks Functional mobility during ADLs: Min guard;Cueing for sequencing;Rolling walker General ADL Comments: Limitations due  to difficulty lifting L LE and pain     Vision   Vision Assessment?: No apparent visual deficits   Perception     Praxis      Cognition Arousal/Alertness: Awake/alert Behavior During Therapy: WFL for tasks assessed/performed Overall Cognitive Status: Within Functional Limits for tasks assessed                                          Exercises     Shoulder Instructions        General Comments      Pertinent Vitals/ Pain       Pain Assessment: 0-10 Pain Score: 5  Pain Location: L knee with movement Pain Descriptors / Indicators: Aching;Operative site guarding Pain Intervention(s): Limited activity within patient's tolerance;Monitored during session;Premedicated before session  Home Living                                          Prior Functioning/Environment              Frequency  Min 2X/week        Progress Toward Goals  OT Goals(current goals can now be found in the care plan section)  Progress towards OT goals: Progressing toward goals  Acute Rehab OT Goals Patient Stated Goal: decrease pain, go home safely OT Goal Formulation: With patient Time For Goal Achievement: 05/03/20 Potential to Achieve Goals: Good ADL Goals Pt Will Perform Grooming: with modified independence;standing Pt Will Perform Lower Body Dressing: with modified independence;with adaptive equipment;sit to/from stand;sitting/lateral leans Pt Will Perform Tub/Shower Transfer: Tub transfer;with modified independence;ambulating;tub bench;rolling walker  Plan Discharge plan remains appropriate    Co-evaluation                 AM-PAC OT "6 Clicks" Daily Activity     Outcome Measure   Help from another person eating meals?: None Help from another person taking care of personal grooming?: A Little Help from another person toileting, which includes using toliet, bedpan, or urinal?: A Little Help from another person bathing (including washing, rinsing, drying)?: A Little Help from another person to put on and taking off regular upper body clothing?: A Little Help from another person to put on and taking off regular lower body clothing?: A Little 6 Click Score: 19    End of Session Equipment Utilized During Treatment: Gait belt;Rolling walker  OT Visit Diagnosis: Unsteadiness on feet (R26.81);Other abnormalities of gait and mobility  (R26.89);Muscle weakness (generalized) (M62.81);Pain Pain - Right/Left: Left Pain - part of body: Knee   Activity Tolerance Patient tolerated treatment well   Patient Left in chair;Other (comment) (with PT)   Nurse Communication Mobility status        Time: 6712-4580 OT Time Calculation (min): 19 min  Charges: OT General Charges $OT Visit: 1 Visit OT Treatments $Self Care/Home Management : 8-22 mins  Layla Maw, OTR/L   Layla Maw 04/20/2020, 12:12 PM

## 2020-04-20 NOTE — TOC Transition Note (Addendum)
Transition of Care [facility]) - CM/SW Discharge Note   Patient Details  Name: [name] MRN: [id number] Date of Birth: [birth year]  [facility] CM/SW Contact:  Sharin Mons, RN Phone Number: 04/20/2020, 4:22 PM   Clinical Narrative:     Patient will DC to: home Anticipated DC date: 04/20/2020 Family notified: yes Transport by: Corey Harold      -s/p L TKA. PMH COPD, CAD, DM, HTN, and PVD.  Per MD patient ready for DC today . RN, patient,  and patient's family notified of DC.  [facility]/Ambulance transport requested for patient.   RNCM will sign off for now as intervention is no longer needed. Please consult [place] again if new needs arise.   Final next level of care: [place] Barriers to Discharge: No Barriers Identified   Patient Goals and CMS Choice        Discharge Placement                       Discharge Plan and Services                DME Arranged: 3-N-1, Walker rolling, Continuous passive motion machine ([employer] was contacted prior to surgery, but unable to reach patient- Rep informed they would reach her in hospital) DME Agency: [employer]       HH Arranged: PT [place] Agency: [facility] (now [facility])        Social Determinants of Health ([place]) Interventions     Readmission Risk Interventions No flowsheet data found.

## 2020-04-20 NOTE — Care Management Important Message (Signed)
Important Message  Patient Details  Name: [name] MRN: [id number] Date of Birth: 1951/06/14   Medicare Important Message Given:  Yes - Important Message mailed due to current National Emergency  Verbal consent obtained due to current National Emergency  Relationship to patient: Self Contact Name: Shyana Kulakowski Call Date: 04/20/20  Time: 1400 Phone: [phone] Outcome: No Answer/Busy Important Message mailed to: Patient address on file    Delorse Lek 04/20/2020, 2:00 PM

## 2020-04-20 NOTE — Progress Notes (Signed)
Physical Therapy Treatment Patient Details Name: [name] MRN: [id number] DOB: June 09, 1951 Today's Date: 04/20/2020    History of Present Illness Pt is a 69 y/o female s/p L TKA on 04/18/20. PMH includes COPD, CAD, DM, HTN, PVD.   PT Comments    Pt progressing with mobility. This afternoon's session focused on gait training and therex ([employer] HEP handout provided). Stability and mobility improving with RW at supervision-level. Deferred additional stair training secondary to pain and fatigue; confirmed ambulance transfer home today. Reviewed educ re: precautions, positioning, therex/ROM, activity recommendations and importance of mobility. Recommend follow-up with HHPT services to maximize functional mobility and independence.     Follow Up Recommendations  Follow surgeon's recommendation for DC plan and follow-up therapies;Home health PT;Supervision for mobility/OOB     Equipment Recommendations  Rolling walker with 5" wheels;3in1 (PT);Other (comment) (tub bench)    Recommendations for Other Services       Precautions / Restrictions Precautions Precautions: Knee;Fall Restrictions Weight Bearing Restrictions: Yes LLE Weight Bearing: Weight bearing as tolerated    Mobility  Bed Mobility               General bed mobility comments: Received sitting in recliner  Transfers Overall transfer level: Needs assistance Equipment used: Rolling walker (2 wheeled) Transfers: Sit to/from Stand Sit to Stand: Supervision         General transfer comment: No cues required for hand placement this session; supervision for safety with sit<>stand to RW from recliner  Ambulation/Gait Ambulation/Gait assistance: Min guard Gait Distance (Feet): 100 Feet Assistive device: Rolling walker (2 wheeled) Gait Pattern/deviations: Step-through pattern;Decreased stride length;Decreased weight shift to left;Trunk flexed;Antalgic Gait velocity: Decreased   General Gait Details:  Slow, antalgic gait with RW and intermittent min guard for balance; improving WBAT through LLE, consistently reaching heel flat to ground   Stairs Stairs:  (deferred secondary to fatigue; plan for ambulance transport home)   Wheelchair Mobility    Modified Rankin (Stroke Patients Only)       Balance Overall balance assessment: Needs assistance Sitting-balance support: No upper extremity supported;Feet supported Sitting balance-Leahy Scale: Good     Standing balance support: Bilateral upper extremity supported;During functional activity;Single extremity supported Standing balance-Leahy Scale: Poor Standing balance comment: Reliant on at least single UE support in standing, decreased WBAT through LLE in static standing                            Cognition Arousal/Alertness: Awake/alert Behavior During Therapy: WFL for tasks assessed/performed Overall Cognitive Status: Within Functional Limits for tasks assessed                                        Exercises Total Joint Exercises Long Arc Quad: AAROM;Left;Seated Knee Flexion: AAROM;Left;Seated (with RLE overpressure for stretch) Other Exercises Other Exercises: [employer] HEP handout (Access Code [email or web address]) provided - seated LAQ, seated knee flexion stretch (with assist from opposite LE), supine heel slides with strap    General Comments General comments (skin integrity, edema, etc.): Instructed pt on use of blue bone foam      Pertinent Vitals/Pain Pain Assessment: Faces Faces Pain Scale: Hurts little more Pain Location: L knee Pain Descriptors / Indicators: Aching;Operative site guarding Pain Intervention(s): Monitored during session    Home Living  Prior Function            PT Goals (current goals can now be found in the care plan section) Progress towards PT goals: Progressing toward goals    Frequency    7X/week      PT Plan Current plan  remains appropriate    Co-evaluation              AM-PAC PT "6 Clicks" Mobility   Outcome Measure  Help needed turning from your back to your side while in a flat bed without using bedrails?: A Little Help needed moving from lying on your back to sitting on the side of a flat bed without using bedrails?: A Little Help needed moving to and from a bed to a chair (including a wheelchair)?: A Little Help needed standing up from a chair using your arms (e.g., wheelchair or bedside chair)?: None Help needed to walk in hospital room?: A Little Help needed climbing 3-5 steps with a railing? : A Little 6 Click Score: 19    End of Session Equipment Utilized During Treatment: Gait belt Activity Tolerance: Patient tolerated treatment well Patient left: in chair;with call bell/phone within reach;with chair alarm set Nurse Communication: Mobility status PT Visit Diagnosis: Other abnormalities of gait and mobility (R26.89);Pain Pain - Right/Left: Left Pain - part of body: Knee     Time: 3435-6861 PT Time Calculation (min) (ACUTE ONLY): 24 min  Charges:  $Gait Training: 8-22 mins $Therapeutic Exercise: 8-22 mins                    Mabeline Caras, PT, DPT Acute Rehabilitation Services  Pager [phone] Office [place] 04/20/2020, 5:09 PM

## 2020-04-20 NOTE — Care Plan (Signed)
Office RNCM contacted hospital Case management and asked for assistance to help coordinate ambulance transport for patient. Office RNCM sent referral to [employer] through [employer] for tub/shower transfer bench to be delivered to patient's home address. Also spoke with [facility] liaison [facility]) and added OT evaluation to her services after discharge. Spoke with patient via phone and updated. Please call Jamse Arn, RN Case Manager if plan changes. [phone].

## 2020-04-21 ENCOUNTER — Telehealth: Payer: Self-pay | Admitting: *Deleted

## 2020-04-21 DIAGNOSIS — E785 Hyperlipidemia, unspecified: Secondary | ICD-10-CM | POA: Diagnosis not present

## 2020-04-21 DIAGNOSIS — F32A Depression, unspecified: Secondary | ICD-10-CM | POA: Diagnosis not present

## 2020-04-21 DIAGNOSIS — Z7984 Long term (current) use of oral hypoglycemic drugs: Secondary | ICD-10-CM | POA: Diagnosis not present

## 2020-04-21 DIAGNOSIS — F17210 Nicotine dependence, cigarettes, uncomplicated: Secondary | ICD-10-CM | POA: Diagnosis not present

## 2020-04-21 DIAGNOSIS — E1151 Type 2 diabetes mellitus with diabetic peripheral angiopathy without gangrene: Secondary | ICD-10-CM | POA: Diagnosis not present

## 2020-04-21 DIAGNOSIS — Z96652 Presence of left artificial knee joint: Secondary | ICD-10-CM | POA: Diagnosis not present

## 2020-04-21 DIAGNOSIS — G4730 Sleep apnea, unspecified: Secondary | ICD-10-CM | POA: Diagnosis not present

## 2020-04-21 DIAGNOSIS — M1712 Unilateral primary osteoarthritis, left knee: Secondary | ICD-10-CM | POA: Diagnosis not present

## 2020-04-21 DIAGNOSIS — D509 Iron deficiency anemia, unspecified: Secondary | ICD-10-CM | POA: Diagnosis not present

## 2020-04-21 DIAGNOSIS — Z471 Aftercare following joint replacement surgery: Secondary | ICD-10-CM | POA: Diagnosis not present

## 2020-04-21 DIAGNOSIS — Z7982 Long term (current) use of aspirin: Secondary | ICD-10-CM | POA: Diagnosis not present

## 2020-04-21 DIAGNOSIS — J449 Chronic obstructive pulmonary disease, unspecified: Secondary | ICD-10-CM | POA: Diagnosis not present

## 2020-04-21 DIAGNOSIS — I10 Essential (primary) hypertension: Secondary | ICD-10-CM | POA: Diagnosis not present

## 2020-04-21 NOTE — Telephone Encounter (Signed)
Ortho bundle D/C call completed. 

## 2020-04-24 DIAGNOSIS — J449 Chronic obstructive pulmonary disease, unspecified: Secondary | ICD-10-CM | POA: Diagnosis not present

## 2020-04-24 DIAGNOSIS — Z7984 Long term (current) use of oral hypoglycemic drugs: Secondary | ICD-10-CM | POA: Diagnosis not present

## 2020-04-24 DIAGNOSIS — F32A Depression, unspecified: Secondary | ICD-10-CM | POA: Diagnosis not present

## 2020-04-24 DIAGNOSIS — Z471 Aftercare following joint replacement surgery: Secondary | ICD-10-CM | POA: Diagnosis not present

## 2020-04-24 DIAGNOSIS — I10 Essential (primary) hypertension: Secondary | ICD-10-CM | POA: Diagnosis not present

## 2020-04-24 DIAGNOSIS — E1151 Type 2 diabetes mellitus with diabetic peripheral angiopathy without gangrene: Secondary | ICD-10-CM | POA: Diagnosis not present

## 2020-04-24 DIAGNOSIS — Z96652 Presence of left artificial knee joint: Secondary | ICD-10-CM | POA: Diagnosis not present

## 2020-04-24 DIAGNOSIS — Z7982 Long term (current) use of aspirin: Secondary | ICD-10-CM | POA: Diagnosis not present

## 2020-04-24 DIAGNOSIS — D509 Iron deficiency anemia, unspecified: Secondary | ICD-10-CM | POA: Diagnosis not present

## 2020-04-24 DIAGNOSIS — E785 Hyperlipidemia, unspecified: Secondary | ICD-10-CM | POA: Diagnosis not present

## 2020-04-24 DIAGNOSIS — F17210 Nicotine dependence, cigarettes, uncomplicated: Secondary | ICD-10-CM | POA: Diagnosis not present

## 2020-04-24 DIAGNOSIS — G4730 Sleep apnea, unspecified: Secondary | ICD-10-CM | POA: Diagnosis not present

## 2020-04-25 ENCOUNTER — Telehealth: Payer: Self-pay | Admitting: *Deleted

## 2020-04-25 NOTE — Telephone Encounter (Signed)
Ortho bundle 7 day call completed.

## 2020-04-26 DIAGNOSIS — J449 Chronic obstructive pulmonary disease, unspecified: Secondary | ICD-10-CM | POA: Diagnosis not present

## 2020-04-26 DIAGNOSIS — Z471 Aftercare following joint replacement surgery: Secondary | ICD-10-CM | POA: Diagnosis not present

## 2020-04-26 DIAGNOSIS — Z96652 Presence of left artificial knee joint: Secondary | ICD-10-CM | POA: Diagnosis not present

## 2020-04-26 DIAGNOSIS — Z7984 Long term (current) use of oral hypoglycemic drugs: Secondary | ICD-10-CM | POA: Diagnosis not present

## 2020-04-26 DIAGNOSIS — Z7982 Long term (current) use of aspirin: Secondary | ICD-10-CM | POA: Diagnosis not present

## 2020-04-26 DIAGNOSIS — F17210 Nicotine dependence, cigarettes, uncomplicated: Secondary | ICD-10-CM | POA: Diagnosis not present

## 2020-04-26 DIAGNOSIS — E1151 Type 2 diabetes mellitus with diabetic peripheral angiopathy without gangrene: Secondary | ICD-10-CM | POA: Diagnosis not present

## 2020-04-26 DIAGNOSIS — E785 Hyperlipidemia, unspecified: Secondary | ICD-10-CM | POA: Diagnosis not present

## 2020-04-26 DIAGNOSIS — D509 Iron deficiency anemia, unspecified: Secondary | ICD-10-CM | POA: Diagnosis not present

## 2020-04-26 DIAGNOSIS — G4730 Sleep apnea, unspecified: Secondary | ICD-10-CM | POA: Diagnosis not present

## 2020-04-26 DIAGNOSIS — I10 Essential (primary) hypertension: Secondary | ICD-10-CM | POA: Diagnosis not present

## 2020-04-26 DIAGNOSIS — F32A Depression, unspecified: Secondary | ICD-10-CM | POA: Diagnosis not present

## 2020-04-27 DIAGNOSIS — Z7984 Long term (current) use of oral hypoglycemic drugs: Secondary | ICD-10-CM | POA: Diagnosis not present

## 2020-04-27 DIAGNOSIS — E1151 Type 2 diabetes mellitus with diabetic peripheral angiopathy without gangrene: Secondary | ICD-10-CM | POA: Diagnosis not present

## 2020-04-27 DIAGNOSIS — F32A Depression, unspecified: Secondary | ICD-10-CM | POA: Diagnosis not present

## 2020-04-27 DIAGNOSIS — G4730 Sleep apnea, unspecified: Secondary | ICD-10-CM | POA: Diagnosis not present

## 2020-04-27 DIAGNOSIS — Z471 Aftercare following joint replacement surgery: Secondary | ICD-10-CM | POA: Diagnosis not present

## 2020-04-27 DIAGNOSIS — F17210 Nicotine dependence, cigarettes, uncomplicated: Secondary | ICD-10-CM | POA: Diagnosis not present

## 2020-04-27 DIAGNOSIS — J449 Chronic obstructive pulmonary disease, unspecified: Secondary | ICD-10-CM | POA: Diagnosis not present

## 2020-04-27 DIAGNOSIS — Z7982 Long term (current) use of aspirin: Secondary | ICD-10-CM | POA: Diagnosis not present

## 2020-04-27 DIAGNOSIS — E785 Hyperlipidemia, unspecified: Secondary | ICD-10-CM | POA: Diagnosis not present

## 2020-04-27 DIAGNOSIS — D509 Iron deficiency anemia, unspecified: Secondary | ICD-10-CM | POA: Diagnosis not present

## 2020-04-27 DIAGNOSIS — Z96652 Presence of left artificial knee joint: Secondary | ICD-10-CM | POA: Diagnosis not present

## 2020-04-27 DIAGNOSIS — I10 Essential (primary) hypertension: Secondary | ICD-10-CM | POA: Diagnosis not present

## 2020-04-28 ENCOUNTER — Other Ambulatory Visit: Payer: Self-pay | Admitting: Surgical

## 2020-04-28 ENCOUNTER — Telehealth: Payer: Self-pay | Admitting: *Deleted

## 2020-04-28 DIAGNOSIS — Z471 Aftercare following joint replacement surgery: Secondary | ICD-10-CM | POA: Diagnosis not present

## 2020-04-28 DIAGNOSIS — J449 Chronic obstructive pulmonary disease, unspecified: Secondary | ICD-10-CM | POA: Diagnosis not present

## 2020-04-28 DIAGNOSIS — D509 Iron deficiency anemia, unspecified: Secondary | ICD-10-CM | POA: Diagnosis not present

## 2020-04-28 DIAGNOSIS — E1151 Type 2 diabetes mellitus with diabetic peripheral angiopathy without gangrene: Secondary | ICD-10-CM | POA: Diagnosis not present

## 2020-04-28 DIAGNOSIS — E785 Hyperlipidemia, unspecified: Secondary | ICD-10-CM | POA: Diagnosis not present

## 2020-04-28 DIAGNOSIS — Z7984 Long term (current) use of oral hypoglycemic drugs: Secondary | ICD-10-CM | POA: Diagnosis not present

## 2020-04-28 DIAGNOSIS — F17210 Nicotine dependence, cigarettes, uncomplicated: Secondary | ICD-10-CM | POA: Diagnosis not present

## 2020-04-28 DIAGNOSIS — I10 Essential (primary) hypertension: Secondary | ICD-10-CM | POA: Diagnosis not present

## 2020-04-28 DIAGNOSIS — Z7982 Long term (current) use of aspirin: Secondary | ICD-10-CM | POA: Diagnosis not present

## 2020-04-28 DIAGNOSIS — F32A Depression, unspecified: Secondary | ICD-10-CM | POA: Diagnosis not present

## 2020-04-28 DIAGNOSIS — G4730 Sleep apnea, unspecified: Secondary | ICD-10-CM | POA: Diagnosis not present

## 2020-04-28 DIAGNOSIS — Z96652 Presence of left artificial knee joint: Secondary | ICD-10-CM | POA: Diagnosis not present

## 2020-04-28 MED ORDER — METHOCARBAMOL 500 MG PO TABS: 500.0000 mg | ORAL_TABLET | Freq: Three times a day (TID) | ORAL | 0 refills | Status: DC | PRN

## 2020-04-28 MED ORDER — OXYCODONE HCL 5 MG PO TABS: 5.0000 mg | ORAL_TABLET | Freq: Four times a day (QID) | ORAL | 0 refills | Status: DC | PRN

## 2020-04-28 NOTE — Telephone Encounter (Signed)
Sent in

## 2020-04-28 NOTE — Telephone Encounter (Signed)
Ortho bundle call- patient called requesting refill of pain medications. Pharmacy [facility] [place]. Thanks.

## 2020-04-28 NOTE — Telephone Encounter (Signed)
[place] for rf can u send to [facility] thx

## 2020-05-01 DIAGNOSIS — E785 Hyperlipidemia, unspecified: Secondary | ICD-10-CM | POA: Diagnosis not present

## 2020-05-01 DIAGNOSIS — G4730 Sleep apnea, unspecified: Secondary | ICD-10-CM | POA: Diagnosis not present

## 2020-05-01 DIAGNOSIS — E1151 Type 2 diabetes mellitus with diabetic peripheral angiopathy without gangrene: Secondary | ICD-10-CM | POA: Diagnosis not present

## 2020-05-01 DIAGNOSIS — F17210 Nicotine dependence, cigarettes, uncomplicated: Secondary | ICD-10-CM | POA: Diagnosis not present

## 2020-05-01 DIAGNOSIS — D509 Iron deficiency anemia, unspecified: Secondary | ICD-10-CM | POA: Diagnosis not present

## 2020-05-01 DIAGNOSIS — Z7984 Long term (current) use of oral hypoglycemic drugs: Secondary | ICD-10-CM | POA: Diagnosis not present

## 2020-05-01 DIAGNOSIS — Z96652 Presence of left artificial knee joint: Secondary | ICD-10-CM | POA: Diagnosis not present

## 2020-05-01 DIAGNOSIS — Z7982 Long term (current) use of aspirin: Secondary | ICD-10-CM | POA: Diagnosis not present

## 2020-05-01 DIAGNOSIS — F32A Depression, unspecified: Secondary | ICD-10-CM | POA: Diagnosis not present

## 2020-05-01 DIAGNOSIS — Z471 Aftercare following joint replacement surgery: Secondary | ICD-10-CM | POA: Diagnosis not present

## 2020-05-01 DIAGNOSIS — I10 Essential (primary) hypertension: Secondary | ICD-10-CM | POA: Diagnosis not present

## 2020-05-01 DIAGNOSIS — J449 Chronic obstructive pulmonary disease, unspecified: Secondary | ICD-10-CM | POA: Diagnosis not present

## 2020-05-02 ENCOUNTER — Telehealth: Payer: Self-pay | Admitting: *Deleted

## 2020-05-02 DIAGNOSIS — G4730 Sleep apnea, unspecified: Secondary | ICD-10-CM | POA: Diagnosis not present

## 2020-05-02 DIAGNOSIS — E1151 Type 2 diabetes mellitus with diabetic peripheral angiopathy without gangrene: Secondary | ICD-10-CM | POA: Diagnosis not present

## 2020-05-02 DIAGNOSIS — F17210 Nicotine dependence, cigarettes, uncomplicated: Secondary | ICD-10-CM | POA: Diagnosis not present

## 2020-05-02 DIAGNOSIS — Z7984 Long term (current) use of oral hypoglycemic drugs: Secondary | ICD-10-CM | POA: Diagnosis not present

## 2020-05-02 DIAGNOSIS — E785 Hyperlipidemia, unspecified: Secondary | ICD-10-CM | POA: Diagnosis not present

## 2020-05-02 DIAGNOSIS — Z7982 Long term (current) use of aspirin: Secondary | ICD-10-CM | POA: Diagnosis not present

## 2020-05-02 DIAGNOSIS — I10 Essential (primary) hypertension: Secondary | ICD-10-CM | POA: Diagnosis not present

## 2020-05-02 DIAGNOSIS — D509 Iron deficiency anemia, unspecified: Secondary | ICD-10-CM | POA: Diagnosis not present

## 2020-05-02 DIAGNOSIS — F32A Depression, unspecified: Secondary | ICD-10-CM | POA: Diagnosis not present

## 2020-05-02 DIAGNOSIS — Z471 Aftercare following joint replacement surgery: Secondary | ICD-10-CM | POA: Diagnosis not present

## 2020-05-02 DIAGNOSIS — J449 Chronic obstructive pulmonary disease, unspecified: Secondary | ICD-10-CM | POA: Diagnosis not present

## 2020-05-02 DIAGNOSIS — Z96652 Presence of left artificial knee joint: Secondary | ICD-10-CM | POA: Diagnosis not present

## 2020-05-02 NOTE — Telephone Encounter (Signed)
14 day Ortho bundle call to patient.

## 2020-05-03 ENCOUNTER — Encounter: Payer: Self-pay | Admitting: Orthopedic Surgery

## 2020-05-03 ENCOUNTER — Ambulatory Visit (INDEPENDENT_AMBULATORY_CARE_PROVIDER_SITE_OTHER): Payer: Medicare Other | Admitting: Orthopedic Surgery

## 2020-05-03 ENCOUNTER — Ambulatory Visit (INDEPENDENT_AMBULATORY_CARE_PROVIDER_SITE_OTHER): Payer: Medicare Other

## 2020-05-03 ENCOUNTER — Telehealth: Payer: Self-pay

## 2020-05-03 ENCOUNTER — Telehealth: Payer: Self-pay | Admitting: *Deleted

## 2020-05-03 DIAGNOSIS — M7989 Other specified soft tissue disorders: Secondary | ICD-10-CM

## 2020-05-03 DIAGNOSIS — Z96652 Presence of left artificial knee joint: Secondary | ICD-10-CM

## 2020-05-03 NOTE — Telephone Encounter (Signed)
Seen in office for 14 day Ortho bundle visit. Doppler study scheduled for tomorrow, 05/04/20 to R/O DVT. Will update OPPT location of results and when she can resume therapy.

## 2020-05-03 NOTE — Telephone Encounter (Signed)
Doppler scheduled for 05/04/20 arrive 845 for 9am appt

## 2020-05-04 ENCOUNTER — Other Ambulatory Visit: Payer: Self-pay

## 2020-05-04 ENCOUNTER — Ambulatory Visit (HOSPITAL_COMMUNITY)
Admission: RE | Admit: 2020-05-04 | Discharge: 2020-05-04 | Disposition: A | Discharge status: Home / Self Care | Payer: Medicare Other | Source: Ambulatory Visit | Attending: Surgical | Admitting: Surgical

## 2020-05-04 DIAGNOSIS — Z96652 Presence of left artificial knee joint: Secondary | ICD-10-CM

## 2020-05-04 DIAGNOSIS — M7989 Other specified soft tissue disorders: Secondary | ICD-10-CM

## 2020-05-04 NOTE — Discharge Summary (Signed)
Physician Discharge Summary      Patient ID: [name] MRN: [id number] DOB/AGE: 07/28/1950 69 y.o.  Admit date: 04/18/2020 Discharge date: 04/20/20  Admission Diagnoses:  Active Problems:   Osteoarthritis of left knee   Discharge Diagnoses:  Same  Surgeries: Procedure(s): LEFT TOTAL KNEE ARTHROPLASTY on 04/18/2020   Consultants:   Discharged Condition: Stable  Hospital Course: [name] is an 69 y.o. female who was admitted 04/18/2020 with a chief complaint of left knee pain, and found to have a diagnosis of left knee OA.  They were brought to the operating room on 04/18/2020 and underwent the above named procedures.  Pt awoke from anesthesia without complication and was transferred to the floor. On POD1, patient's pain was too severe for discharge.  The next day, her pain was vastly improved and she was able to manage stair training in PT.  Discharged home on POD2 .  Pt will f/u with Dr. Marlou Sa in clinic in ~2 weeks.   Antibiotics given:  Anti-infectives (From admission, onward)   Start     Dose/Rate Route Frequency Ordered Stop   04/18/20 1600  ceFAZolin (ANCEF) IVPB 2g/100 mL premix        2 g 200 mL/hr over 30 Minutes Intravenous Every 8 hours 04/18/20 1320 04/19/20 0130   04/18/20 0936  vancomycin (VANCOCIN) powder  Status:  Discontinued          As needed 04/18/20 0936 04/18/20 1029   04/18/20 0600  ceFAZolin (ANCEF) IVPB 2g/100 mL premix        2 g 200 mL/hr over 30 Minutes Intravenous On call to O.R. 04/18/20 0554 04/18/20 0736   04/18/20 0557  ceFAZolin (ANCEF) 2-4 GM/100ML-% IVPB       Note to Pharmacy: Gleason, Ginger   : cabinet override      04/18/20 0557 04/18/20 0750    .  Recent vital signs:  Vitals:   04/20/20 0525 04/20/20 0738  BP: 136/67 131/71  Pulse: 91 89  Resp: 19 17  Temp: 98.8 F (37.1 C) 99.4 F (37.4 C)  SpO2: 94% 97%    Recent laboratory studies:  Results for orders placed or performed during the hospital  encounter of 04/18/20  Glucose, capillary  Result Value Ref Range   Glucose-Capillary 82 70 - 99 mg/dL  Glucose, capillary  Result Value Ref Range   Glucose-Capillary 100 (H) 70 - 99 mg/dL    Discharge Medications:   Allergies as of 04/20/2020   No Known Allergies     Medication List    STOP taking these medications   traMADol 50 MG tablet Commonly known as: ULTRAM     TAKE these medications   albuterol 108 (90 Base) MCG/ACT inhaler Commonly known as: VENTOLIN HFA Inhale 1-2 puffs into the lungs every 6 (six) hours as needed for wheezing or shortness of breath.   amLODipine 10 MG tablet Commonly known as: NORVASC Take 10 mg by mouth daily.   amoxicillin-clavulanate 875-125 MG tablet Commonly known as: AUGMENTIN Take 1 tablet by mouth 2 (two) times daily.   aspirin EC 81 MG tablet Take 81 mg by mouth daily.   gabapentin 600 MG tablet Commonly known as: NEURONTIN Take 1 tablet (600 mg total) by mouth at bedtime. What changed:   when to take this  reasons to take this   hydrALAZINE 100 MG tablet Commonly known as: APRESOLINE TAKE 1 TABLET(100 MG) BY MOUTH THREE TIMES DAILY What changed: See the new instructions.   hydrOXYzine  50 MG capsule Commonly known as: VISTARIL Take 50 mg by mouth at bedtime.   losartan 100 MG tablet Commonly known as: COZAAR Take 100 mg by mouth daily.   meloxicam 7.5 MG tablet Commonly known as: Mobic Take 2 tablets (15 mg total) by mouth daily.   metFORMIN 500 MG tablet Commonly known as: GLUCOPHAGE Take 0.5 tablets (250 mg total) by mouth 2 (two) times daily with a meal.   nitroGLYCERIN 0.4 MG SL tablet Commonly known as: NITROSTAT Place 1 tablet (0.4 mg total) under the tongue every 5 (five) minutes as needed for chest pain.   spironolactone 50 MG tablet Commonly known as: ALDACTONE TAKE 1 TABLET(50 MG) BY MOUTH DAILY What changed: See the new instructions.   Vitamin D (Ergocalciferol) 1.25 MG (50000 UNIT) Caps  capsule Commonly known as: DRISDOL Take 50,000 Units by mouth once a week.       Diagnostic Studies: CT ABDOMEN PELVIS W CONTRAST  Result Date: 04/05/2020 CLINICAL DATA:  Acute lower abdominal pain. EXAM: CT ABDOMEN AND PELVIS WITH CONTRAST TECHNIQUE: Multidetector CT imaging of the abdomen and pelvis was performed using the standard protocol following bolus administration of intravenous contrast. CONTRAST:  129mL OMNIPAQUE IOHEXOL 300 MG/ML  SOLN COMPARISON:  None. FINDINGS: Lower chest: No acute abnormality. Hepatobiliary: No focal liver abnormality is seen. No gallstones, gallbladder wall thickening, or biliary dilatation. Pancreas: Unremarkable. No pancreatic ductal dilatation or surrounding inflammatory changes. Spleen: Normal in size without focal abnormality. Adrenals/Urinary Tract: Adrenal glands are unremarkable. Kidneys are normal, without renal calculi, focal lesion, or hydronephrosis. Bladder is unremarkable. Stomach/Bowel: The stomach appears normal. There is no evidence of bowel obstruction. Probable mild distal sigmoid diverticulitis is noted without abscess formation. Vascular/Lymphatic: Aortic atherosclerosis. No enlarged abdominal or pelvic lymph nodes. Reproductive: Status post hysterectomy. No adnexal masses. Other: No abdominal wall hernia or abnormality. No abdominopelvic ascites. Musculoskeletal: No acute or significant osseous findings. IMPRESSION: Probable mild distal sigmoid diverticulitis is noted without abscess formation. Aortic Atherosclerosis (ICD10-I70.0). Electronically Signed   By: Marijo Conception M.D.   On: 04/05/2020 14:04   XR Knee 1-2 Views Left  Result Date: 05/03/2020 AP lateral left knee reviewed.  Press-fit prosthesis in good position alignment with no complicating features.  No acute fracture.   Disposition: Discharge disposition: 01-Home or Self Care       Discharge Instructions    Call MD / Call 911   Complete by: As directed    If you  experience chest pain or shortness of breath, CALL 911 and be transported to the hospital emergency room.  If you develope a fever above 101 F, pus (white drainage) or increased drainage or redness at the wound, or calf pain, call your surgeon's office.   Constipation Prevention   Complete by: As directed    Drink plenty of fluids.  Prune juice may be helpful.  You may use a stool softener, such as Colace (over the counter) 100 mg twice a day.  Use MiraLax (over the counter) for constipation as needed.   Diet - low sodium heart healthy   Complete by: As directed    Discharge instructions   Complete by: As directed    You may shower, dressing is waterproof.  Do not remove the dressing, we will remove it at your first post-op appointment.  Do not take a bath or soak the knee in a tub or pool.  You may weightbear as you can tolerate on the operative leg with a walker.  Continue  using the CPM machine 3 times per day for one hour each time, increasing the degrees of range of motion daily.  Use the blue cradle boot under your heel to work on getting your leg straight.  Do NOT put a pillow under your knee.  You will follow-up with Dr. Marlou Sa in the clinic in 2 weeks at your given appointment date.  You are scheduled to see Dr. Marlou Sa in clinic on 05/03/20 at 1 PM.    Dental Antibiotics:  In most cases prophylactic antibiotics for Dental procdeures after total joint surgery are not necessary.  Exceptions are as follows:  1. History of prior total joint infection  2. Severely immunocompromised (Organ Transplant, cancer chemotherapy, Rheumatoid biologic meds such as [place])  3. Poorly controlled diabetes (A1C &gt; 8.0, blood glucose over 200)  If you have one of these conditions, contact your surgeon for an antibiotic prescription, prior to your dental procedure.   Increase activity slowly as tolerated   Complete by: As directed        Follow-up Information    Marlou Sa, Tonna Corner, MD. Go on  05/03/2020.   Specialty: Orthopedic Surgery Why: at 1:00 pm for your in office post-op appointment with Dr. Marlou Sa. Contact information: [place] [address] [phone]        [facility] Follow up.   Specialty: [place] Why: Someone from the home health agency will be in contact with you to arrange your first in home physical therapy appointment Contact information: [place] [address] [phone]                Signed: Donella Stade 05/04/2020, 8:14 AM

## 2020-05-04 NOTE — Progress Notes (Signed)
Left Lower Ext. study completed.   See CVProc for preliminary results.   Griffin Basil, RDMS, RVT

## 2020-05-05 ENCOUNTER — Telehealth: Payer: Self-pay | Admitting: *Deleted

## 2020-05-05 NOTE — Telephone Encounter (Signed)
Ortho bundle call to patient. Left VM requesting call back since Doppler u/s was negative for DVT. Would like to get her back on schedule to begin OPPT.

## 2020-05-06 ENCOUNTER — Encounter: Payer: Self-pay | Admitting: Orthopedic Surgery

## 2020-05-06 MED ORDER — METHOCARBAMOL 500 MG PO TABS: 500.0000 mg | ORAL_TABLET | Freq: Three times a day (TID) | ORAL | 0 refills | Status: DC | PRN

## 2020-05-06 MED ORDER — OXYCODONE HCL 5 MG PO TABS: 5.0000 mg | ORAL_TABLET | Freq: Three times a day (TID) | ORAL | 0 refills | Status: DC | PRN

## 2020-05-06 MED ORDER — ASPIRIN EC 81 MG PO TBEC
81.0000 mg | DELAYED_RELEASE_TABLET | Freq: Every day | ORAL | 0 refills | Status: DC
Start: 2020-05-06 — End: 2020-05-30

## 2020-05-06 NOTE — Progress Notes (Signed)
Post-Op Visit Note   Patient: [name]           Date of Birth: 1951/06/20           MRN: [id number] Visit Date: 05/03/2020 PCP: Benito Mccreedy, MD   Assessment & Plan:  Chief Complaint:  Chief Complaint  Patient presents with  . Left Knee - Routine Post Op   Visit Diagnoses:  1. Status post total left knee replacement   2. Swelling of left lower extremity     Plan: Patient is a 69 year old female presents s/p left total knee arthroplasty on 04/18/2020.  She states that pain is somewhat improved compared with discharge from the hospital.  She is ambulating with a cane.  She takes oxycodone about twice per day for pain control.  She is also taking Robaxin which is helped with her muscle spasms.  She is using CPM machine about 1 time per day.  Recommended she increase this to 3 times per day as recommended.  She has 0 to 5 degrees of extension and about 75 degrees of flexion.  She starts physical therapy tomorrow.  She has not been taking any aspirin since her procedure, despite recommendation.  She does have some mild to moderate calf tenderness of the left lower extremity on exam.  Negative Homans' sign.  Incision is healing well without any evidence of dehiscence or infection.  Steri-Strips applied over the incision.  Plan to continue with physical therapy.  Ordered ultrasound of the left lower extremity to rule out DVT.  We will refill patient's pain medication and Robaxin as well as aspirin.  Follow-up in 4 weeks for clinical recheck.  Follow-Up Instructions: No follow-ups on file.   Orders:  Orders Placed This Encounter  Procedures  . XR Knee 1-2 Views Left  . VAS [place] LOWER EXTREMITY VENOUS (DVT)   No orders of the defined types were placed in this encounter.   Imaging: No results found.  PMFS History: Patient Active Problem List   Diagnosis Date Noted  . Osteoarthritis of left knee 04/18/2020  . Tobacco use disorder 09/18/2018  . Iron deficiency anemia  04/09/2018  . Symptomatic anemia 12/26/2015  . Hypokalemia 12/26/2015  . Type 2 diabetes mellitus with peripheral vascular disease ([place]) 12/26/2015  . Claudication in peripheral vascular disease ([place]) 10/05/2013  . Discomfort in chest 05/11/2012  . Nicotine dependence 05/11/2012  . Bradycardia 05/11/2012  . HTN (hypertension) 05/11/2012  . Back pain 05/11/2012   Past Medical History:  Diagnosis Date  . Anemia 12/26/2015  . Anxiety   . Arthritis   . Blood transfusion without reported diagnosis   . Chronic pain    resolved per pt 04/12/20  . COPD (chronic obstructive pulmonary disease) ([place])   . Coronary artery disease   . Depression   . Diabetes mellitus without complication ([place])    type 2  . GERD (gastroesophageal reflux disease)   . Heart murmur    since birth; Echo 02/18/19: LVEF 99%, grade 1 diastolic dysfunction, mild AS (mean grad 12 mmHg), trace MR/PR, mild TR, PASP 32 mmHg    . Hyperlipemia   . Hypertension   . PVD (peripheral vascular disease) ([place])    right SFA stent 02/11/17 by Dr. Einar Gip  . Sleep apnea    does not use cpap  . Wears glasses     Family History  Problem Relation Age of Onset  . Diabetes Mother   . Hypertension Mother   . Heart disease Mother   .  Heart disease Father   . Hypertension Father   . Hypertension Sister   . Hypertension Brother   . Diabetes Brother   . Hypertension Sister   . Hypertension Brother   . Diabetes Brother   . Hypertension Brother   . Hypertension Brother   . Hypertension Brother     Past Surgical History:  Procedure Laterality Date  . ABDOMINAL HYSTERECTOMY    . CARDIAC CATHETERIZATION    . CATARACT EXTRACTION    . CESAREAN SECTION    . COLONOSCOPY N/A 01/08/2013   Procedure: COLONOSCOPY;  Surgeon: Beryle Beams, MD;  Location: [facility] ENDOSCOPY;  Service: Endoscopy;  Laterality: N/A;  . COLONOSCOPY N/A 12/28/2015   Procedure: COLONOSCOPY;  Surgeon: Carol Ada, MD;  [facility] ENDOSCOPY;  Service: Endoscopy;   Laterality: N/A;  . ENTEROSCOPY N/A 12/28/2015   Procedure: ENTEROSCOPY;  Surgeon: Carol Ada, MD;  Location: [facility];  Service: Endoscopy;  Laterality: N/A;  . ENTEROSCOPY N/A 02/20/2018   Procedure: ENTEROSCOPY;  Surgeon: Carol Ada, MD;  Location: [facility] ENDOSCOPY;  Service: Endoscopy;  Laterality: N/A;  . ENTEROSCOPY N/A 03/27/2018   Procedure: ENTEROSCOPY;  Surgeon: Carol Ada, MD;  Location: [facility] ENDOSCOPY;  Service: Endoscopy;  Laterality: N/A;  . ESOPHAGOGASTRODUODENOSCOPY N/A 01/08/2013   Procedure: ESOPHAGOGASTRODUODENOSCOPY (EGD);  Surgeon: Beryle Beams, MD;  Location: [facility] ENDOSCOPY;  Service: Endoscopy;  Laterality: N/A;  . EYE SURGERY    . HAND SURGERY    . HOT HEMOSTASIS N/A 12/28/2015   Procedure: HOT HEMOSTASIS (ARGON PLASMA COAGULATION/BICAP);  Surgeon: Carol Ada, MD;  Location: [facility] ENDOSCOPY;  Service: Endoscopy;  Laterality: N/A;  . HOT HEMOSTASIS N/A 02/20/2018   Procedure: HOT HEMOSTASIS (ARGON PLASMA COAGULATION/BICAP);  Surgeon: Carol Ada, MD;  Location: [facility] ENDOSCOPY;  Service: Endoscopy;  Laterality: N/A;  . HOT HEMOSTASIS N/A 03/27/2018   Procedure: HOT HEMOSTASIS (ARGON PLASMA COAGULATION/BICAP);  Surgeon: Carol Ada, MD;  Location: [facility] ENDOSCOPY;  Service: Endoscopy;  Laterality: N/A;  . LEFT HEART CATHETERIZATION WITH CORONARY ANGIOGRAM N/A 03/09/2013   Procedure: LEFT HEART CATHETERIZATION WITH CORONARY ANGIOGRAM;  Surgeon: Laverda Page, MD;  Location: [facility] CATH LAB;  Service: Cardiovascular;  Laterality: N/A;  . LOWER EXTREMITY ANGIOGRAM N/A 12/29/2012   Procedure: LOWER EXTREMITY ANGIOGRAM;  Surgeon: Laverda Page, MD;  Location: [facility] CATH LAB;  Service: Cardiovascular;  Laterality: N/A;  . LOWER EXTREMITY ANGIOGRAM N/A 10/05/2013   Procedure: LOWER EXTREMITY ANGIOGRAM;  Surgeon: Laverda Page, MD;  Location: [facility] CATH LAB;  Service: Cardiovascular;  Laterality: N/A;  . LOWER EXTREMITY ANGIOGRAPHY N/A 02/11/2017   Procedure: Lower Extremity Angiography;   Surgeon: Adrian Prows, MD;  Location: [facility] CV LAB;  Service: Cardiovascular;  Laterality: N/A;  . PERIPHERAL VASCULAR BALLOON ANGIOPLASTY  02/11/2017   Procedure: PERIPHERAL VASCULAR BALLOON ANGIOPLASTY;  Surgeon: Adrian Prows, MD;  Location: [facility] CV LAB;  Service: Cardiovascular;;  Right SFA  . PERIPHERAL VASCULAR INTERVENTION Right 02/11/2017   Procedure: PERIPHERAL VASCULAR INTERVENTION;  Surgeon: Adrian Prows, MD;  Location: [facility] CV LAB;  Service: Cardiovascular;  Laterality: Right;  Rt SFA  . stent in right leg  12/29/2012   for blood clot, Dr. Nadyne Coombes  . TOTAL KNEE ARTHROPLASTY Left 04/18/2020  . TOTAL KNEE ARTHROPLASTY Left 04/18/2020   Procedure: LEFT TOTAL KNEE ARTHROPLASTY;  Surgeon: Meredith Pel, MD;  Location: [facility];  Service: Orthopedics;  Laterality: Left;  . TYMPANOMASTOIDECTOMY Left 03/08/2014   Procedure: TYMPANOMASTOIDECTOMY LEFT;  Surgeon: Ascencion Dike, MD;  Location: [facility];  Service: ENT;  Laterality: Left;  . TYMPANOMASTOIDECTOMY  2015  . UTERINE FIBROID SURGERY     Social History   Occupational History  . Not on file  Tobacco Use  . Smoking status: Current Every Day Smoker    Packs/day: 0.50    Years: 41.00    Pack years: 20.50    Types: Cigarettes  . Smokeless tobacco: Never Used  Vaping Use  . Vaping Use: Never used  Substance and Sexual Activity  . Alcohol use: Yes    Comment: occasional  . Drug use: Yes    Types: Marijuana    Comment: Last use 04/11/20  . Sexual activity: Not Currently    Birth control/protection: Surgical    Comment: Hysterectomy

## 2020-05-10 ENCOUNTER — Telehealth: Payer: Self-pay | Admitting: *Deleted

## 2020-05-10 ENCOUNTER — Encounter: Payer: Self-pay | Admitting: Orthopedic Surgery

## 2020-05-10 DIAGNOSIS — M25662 Stiffness of left knee, not elsewhere classified: Secondary | ICD-10-CM | POA: Diagnosis not present

## 2020-05-10 DIAGNOSIS — R262 Difficulty in walking, not elsewhere classified: Secondary | ICD-10-CM | POA: Diagnosis not present

## 2020-05-10 DIAGNOSIS — M25462 Effusion, left knee: Secondary | ICD-10-CM | POA: Diagnosis not present

## 2020-05-10 DIAGNOSIS — M25562 Pain in left knee: Secondary | ICD-10-CM | POA: Diagnosis not present

## 2020-05-10 NOTE — Telephone Encounter (Signed)
Ortho bundle call completed. Spoke with patient and [facility] PT. Patient to start OPPT today (05/10/20) at 1:00 pm.

## 2020-05-12 DIAGNOSIS — M25562 Pain in left knee: Secondary | ICD-10-CM | POA: Diagnosis not present

## 2020-05-12 DIAGNOSIS — M25462 Effusion, left knee: Secondary | ICD-10-CM | POA: Diagnosis not present

## 2020-05-12 DIAGNOSIS — R262 Difficulty in walking, not elsewhere classified: Secondary | ICD-10-CM | POA: Diagnosis not present

## 2020-05-12 DIAGNOSIS — M25662 Stiffness of left knee, not elsewhere classified: Secondary | ICD-10-CM | POA: Diagnosis not present

## 2020-05-14 DIAGNOSIS — R262 Difficulty in walking, not elsewhere classified: Secondary | ICD-10-CM | POA: Diagnosis not present

## 2020-05-14 DIAGNOSIS — M25462 Effusion, left knee: Secondary | ICD-10-CM | POA: Diagnosis not present

## 2020-05-14 DIAGNOSIS — M25662 Stiffness of left knee, not elsewhere classified: Secondary | ICD-10-CM | POA: Diagnosis not present

## 2020-05-14 DIAGNOSIS — M25562 Pain in left knee: Secondary | ICD-10-CM | POA: Diagnosis not present

## 2020-05-23 ENCOUNTER — Telehealth: Payer: Self-pay | Admitting: *Deleted

## 2020-05-23 DIAGNOSIS — R262 Difficulty in walking, not elsewhere classified: Secondary | ICD-10-CM | POA: Diagnosis not present

## 2020-05-23 DIAGNOSIS — M25662 Stiffness of left knee, not elsewhere classified: Secondary | ICD-10-CM | POA: Diagnosis not present

## 2020-05-23 DIAGNOSIS — M25462 Effusion, left knee: Secondary | ICD-10-CM | POA: Diagnosis not present

## 2020-05-23 DIAGNOSIS — M25562 Pain in left knee: Secondary | ICD-10-CM | POA: Diagnosis not present

## 2020-05-23 NOTE — Telephone Encounter (Signed)
Attempted Ortho bundle 30 day call. No answer and left VM requesting call back.

## 2020-05-24 ENCOUNTER — Telehealth: Payer: Self-pay | Admitting: *Deleted

## 2020-05-24 NOTE — Telephone Encounter (Signed)
Ortho bundle 30 day call completed. °

## 2020-05-29 ENCOUNTER — Other Ambulatory Visit: Payer: Self-pay | Admitting: Cardiology

## 2020-05-29 ENCOUNTER — Other Ambulatory Visit: Payer: Self-pay | Admitting: Surgical

## 2020-05-29 NOTE — Telephone Encounter (Signed)
Please advise. Thanks.  

## 2020-05-30 DIAGNOSIS — M25662 Stiffness of left knee, not elsewhere classified: Secondary | ICD-10-CM | POA: Diagnosis not present

## 2020-05-30 DIAGNOSIS — M25562 Pain in left knee: Secondary | ICD-10-CM | POA: Diagnosis not present

## 2020-05-30 DIAGNOSIS — M25462 Effusion, left knee: Secondary | ICD-10-CM | POA: Diagnosis not present

## 2020-05-30 DIAGNOSIS — R262 Difficulty in walking, not elsewhere classified: Secondary | ICD-10-CM | POA: Diagnosis not present

## 2020-05-31 ENCOUNTER — Ambulatory Visit (INDEPENDENT_AMBULATORY_CARE_PROVIDER_SITE_OTHER): Payer: Medicare Other | Admitting: Orthopedic Surgery

## 2020-05-31 DIAGNOSIS — Z96652 Presence of left artificial knee joint: Secondary | ICD-10-CM

## 2020-06-03 ENCOUNTER — Encounter: Payer: Self-pay | Admitting: Orthopedic Surgery

## 2020-06-03 NOTE — Progress Notes (Signed)
Post-Op Visit Note   Patient: [name]           Date of Birth: 1951/03/03           MRN: [id number] Visit Date: 05/31/2020 PCP: Benito Mccreedy, MD   Assessment & Plan:  Chief Complaint:  Chief Complaint  Patient presents with  . Left Knee - Follow-up    04/18/2020 Left TKA   Visit Diagnoses:  1. Status post total left knee replacement     Plan: [name] is a 69 year old patient who underwent left total knee replacement 04/18/2020.  Taking no meds for pain.  Therapy is going well.  On exam she has range of motion 0-80.  I really want her to focus on achieving more flexion.  Do not really want to consider manipulation yet but we could consider if needed.  6-week return for clinical recheck.  Negative Homans no calf tenderness today.  Follow-Up Instructions: Return in about 6 weeks (around 07/12/2020).   Orders:  No orders of the defined types were placed in this encounter.  No orders of the defined types were placed in this encounter.   Imaging: No results found.  PMFS History: Patient Active Problem List   Diagnosis Date Noted  . Osteoarthritis of left knee 04/18/2020  . Tobacco use disorder 09/18/2018  . Iron deficiency anemia 04/09/2018  . Symptomatic anemia 12/26/2015  . Hypokalemia 12/26/2015  . Type 2 diabetes mellitus with peripheral vascular disease ([place]) 12/26/2015  . Claudication in peripheral vascular disease ([place]) 10/05/2013  . Discomfort in chest 05/11/2012  . Nicotine dependence 05/11/2012  . Bradycardia 05/11/2012  . HTN (hypertension) 05/11/2012  . Back pain 05/11/2012   Past Medical History:  Diagnosis Date  . Anemia 12/26/2015  . Anxiety   . Arthritis   . Blood transfusion without reported diagnosis   . Chronic pain    resolved per pt 04/12/20  . COPD (chronic obstructive pulmonary disease) ([place])   . Coronary artery disease   . Depression   . Diabetes mellitus without complication ([place])    type 2  . GERD  (gastroesophageal reflux disease)   . Heart murmur    since birth; Echo 02/18/19: LVEF 28%, grade 1 diastolic dysfunction, mild AS (mean grad 12 mmHg), trace MR/PR, mild TR, PASP 32 mmHg    . Hyperlipemia   . Hypertension   . PVD (peripheral vascular disease) ([place])    right SFA stent 02/11/17 by Dr. Einar Gip  . Sleep apnea    does not use cpap  . Wears glasses     Family History  Problem Relation Age of Onset  . Diabetes Mother   . Hypertension Mother   . Heart disease Mother   . Heart disease Father   . Hypertension Father   . Hypertension Sister   . Hypertension Brother   . Diabetes Brother   . Hypertension Sister   . Hypertension Brother   . Diabetes Brother   . Hypertension Brother   . Hypertension Brother   . Hypertension Brother     Past Surgical History:  Procedure Laterality Date  . ABDOMINAL HYSTERECTOMY    . CARDIAC CATHETERIZATION    . CATARACT EXTRACTION    . CESAREAN SECTION    . COLONOSCOPY N/A 01/08/2013   Procedure: COLONOSCOPY;  Surgeon: Beryle Beams, MD;  Location: [facility];  Service: Endoscopy;  Laterality: N/A;  . COLONOSCOPY N/A 12/28/2015   Procedure: COLONOSCOPY;  Surgeon: Carol Ada, MD;  Location: [place];  Service:  Endoscopy;  Laterality: N/A;  . ENTEROSCOPY N/A 12/28/2015   Procedure: ENTEROSCOPY;  Surgeon: Carol Ada, MD;  Location: [place];  Service: Endoscopy;  Laterality: N/A;  . ENTEROSCOPY N/A 02/20/2018   Procedure: ENTEROSCOPY;  Surgeon: Carol Ada, MD;  Location: [facility];  Service: Endoscopy;  Laterality: N/A;  . ENTEROSCOPY N/A 03/27/2018   Procedure: ENTEROSCOPY;  Surgeon: Carol Ada, MD;  [facility]: [facility];  Service: Endoscopy;  Laterality: N/A;  . ESOPHAGOGASTRODUODENOSCOPY N/A 01/08/2013   Procedure: ESOPHAGOGASTRODUODENOSCOPY (EGD);  Surgeon: Beryle Beams, MD;  Location: [facility];  Service: Endoscopy;  Laterality: N/A;  . EYE SURGERY    . HAND SURGERY    . HOT HEMOSTASIS N/A 12/28/2015   Procedure:  HOT HEMOSTASIS (ARGON PLASMA COAGULATION/BICAP);  Surgeon: Carol Ada, MD;  Location: [facility] ENDOSCOPY;  Service: Endoscopy;  Laterality: N/A;  . HOT HEMOSTASIS N/A 02/20/2018   Procedure: HOT HEMOSTASIS (ARGON PLASMA COAGULATION/BICAP);  Surgeon: Carol Ada, MD;  Location: [facility];  Service: Endoscopy;  Laterality: N/A;  . HOT HEMOSTASIS N/A 03/27/2018   Procedure: HOT HEMOSTASIS (ARGON PLASMA COAGULATION/BICAP);  Surgeon: Carol Ada, MD;  Location: [facility];  Service: Endoscopy;  Laterality: N/A;  . LEFT HEART CATHETERIZATION WITH CORONARY ANGIOGRAM N/A 03/09/2013   Procedure: LEFT HEART CATHETERIZATION WITH CORONARY ANGIOGRAM;  Surgeon: Laverda Page, MD;  Location[facility] CATH LAB;  Service: Cardiovascular;  Laterality: N/A;  . LOWER EXTREMITY ANGIOGRAM N/A 12/29/2012   Procedure: LOWER EXTREMITY ANGIOGRAM;  Surgeon: Laverda Page, MD;  Location: [facility] CATH LAB;  Service: Cardiovascular;  Laterality: N/A;  . LOWER EXTREMITY ANGIOGRAM N/A 10/05/2013   Procedure: LOWER EXTREMITY ANGIOGRAM;  Surgeon: Laverda Page, MD;  Location[facility] CATH LAB;  Service: Cardiovascular;  Laterality: N/A;  . LOWER EXTREMITY ANGIOGRAPHY N/A 02/11/2017   Procedure: Lower Extremity Angiography;  Surgeon: Adrian Prows, MD;  Location: [place] CV LAB;  Service: Cardiovascular;  Laterality: N/A;  . PERIPHERAL VASCULAR BALLOON ANGIOPLASTY  02/11/2017   Procedure: PERIPHERAL VASCULAR BALLOON ANGIOPLASTY;  Surgeon: Adrian Prows, MD;  Location: [place] CV LAB;  Service: Cardiovascular;;  Right SFA  . PERIPHERAL VASCULAR INTERVENTION Right 02/11/2017   Procedure: PERIPHERAL VASCULAR INTERVENTION;  Surgeon: Adrian Prows, MD;  Location: [place] CV LAB;  Service: Cardiovascular;  Laterality: Right;  Rt SFA  . stent in right leg  12/29/2012   for blood clot, Dr. Nadyne Coombes  . TOTAL KNEE ARTHROPLASTY Left 04/18/2020  . TOTAL KNEE ARTHROPLASTY Left 04/18/2020   Procedure: LEFT TOTAL KNEE ARTHROPLASTY;  Surgeon: Meredith Pel, MD;  Location: [place];  Service: Orthopedics;  Laterality: Left;  . TYMPANOMASTOIDECTOMY Left 03/08/2014   Procedure: TYMPANOMASTOIDECTOMY LEFT;  Surgeon: Ascencion Dike, MD;  Location: [place];  Service: ENT;  Laterality: Left;  . TYMPANOMASTOIDECTOMY  2015  . UTERINE FIBROID SURGERY     Social History   Occupational History  . Not on file  Tobacco Use  . Smoking status: Current Every Day Smoker    Packs/day: 0.50    Years: 41.00    Pack years: 20.50    Types: Cigarettes  . Smokeless tobacco: Never Used  Vaping Use  . Vaping Use: Never used  Substance and Sexual Activity  . Alcohol use: Yes    Comment: occasional  . Drug use: Yes    Types: Marijuana    Comment: Last use 04/11/20  . Sexual activity: Not Currently    Birth control/protection: Surgical    Comment: Hysterectomy

## 2020-06-06 DIAGNOSIS — Z23 Encounter for immunization: Secondary | ICD-10-CM | POA: Diagnosis not present

## 2020-06-06 DIAGNOSIS — J449 Chronic obstructive pulmonary disease, unspecified: Secondary | ICD-10-CM | POA: Diagnosis not present

## 2020-06-06 DIAGNOSIS — K219 Gastro-esophageal reflux disease without esophagitis: Secondary | ICD-10-CM | POA: Diagnosis not present

## 2020-06-06 DIAGNOSIS — Z0001 Encounter for general adult medical examination with abnormal findings: Secondary | ICD-10-CM | POA: Diagnosis not present

## 2020-06-06 DIAGNOSIS — E1140 Type 2 diabetes mellitus with diabetic neuropathy, unspecified: Secondary | ICD-10-CM | POA: Diagnosis not present

## 2020-06-06 DIAGNOSIS — I10 Essential (primary) hypertension: Secondary | ICD-10-CM | POA: Diagnosis not present

## 2020-06-07 DIAGNOSIS — M25662 Stiffness of left knee, not elsewhere classified: Secondary | ICD-10-CM | POA: Diagnosis not present

## 2020-06-07 DIAGNOSIS — R262 Difficulty in walking, not elsewhere classified: Secondary | ICD-10-CM | POA: Diagnosis not present

## 2020-06-07 DIAGNOSIS — M25562 Pain in left knee: Secondary | ICD-10-CM | POA: Diagnosis not present

## 2020-06-07 DIAGNOSIS — M25462 Effusion, left knee: Secondary | ICD-10-CM | POA: Diagnosis not present

## 2020-06-13 DIAGNOSIS — H6123 Impacted cerumen, bilateral: Secondary | ICD-10-CM | POA: Diagnosis not present

## 2020-06-13 DIAGNOSIS — H7112 Cholesteatoma of tympanum, left ear: Secondary | ICD-10-CM | POA: Diagnosis not present

## 2020-06-13 DIAGNOSIS — H903 Sensorineural hearing loss, bilateral: Secondary | ICD-10-CM | POA: Diagnosis not present

## 2020-06-22 DIAGNOSIS — M25562 Pain in left knee: Secondary | ICD-10-CM | POA: Diagnosis not present

## 2020-06-22 DIAGNOSIS — R262 Difficulty in walking, not elsewhere classified: Secondary | ICD-10-CM | POA: Diagnosis not present

## 2020-06-22 DIAGNOSIS — M25462 Effusion, left knee: Secondary | ICD-10-CM | POA: Diagnosis not present

## 2020-06-22 DIAGNOSIS — M25662 Stiffness of left knee, not elsewhere classified: Secondary | ICD-10-CM | POA: Diagnosis not present

## 2020-06-28 ENCOUNTER — Other Ambulatory Visit: Payer: Self-pay | Admitting: Surgical

## 2020-06-28 NOTE — Telephone Encounter (Signed)
Pls advise.  

## 2020-07-04 DIAGNOSIS — M25562 Pain in left knee: Secondary | ICD-10-CM | POA: Diagnosis not present

## 2020-07-04 DIAGNOSIS — M25462 Effusion, left knee: Secondary | ICD-10-CM | POA: Diagnosis not present

## 2020-07-04 DIAGNOSIS — M25662 Stiffness of left knee, not elsewhere classified: Secondary | ICD-10-CM | POA: Diagnosis not present

## 2020-07-04 DIAGNOSIS — R262 Difficulty in walking, not elsewhere classified: Secondary | ICD-10-CM | POA: Diagnosis not present

## 2020-07-12 ENCOUNTER — Other Ambulatory Visit: Payer: Self-pay

## 2020-07-12 ENCOUNTER — Ambulatory Visit (INDEPENDENT_AMBULATORY_CARE_PROVIDER_SITE_OTHER): Payer: Medicare Other | Admitting: Orthopedic Surgery

## 2020-07-12 DIAGNOSIS — Z96652 Presence of left artificial knee joint: Secondary | ICD-10-CM

## 2020-07-21 ENCOUNTER — Telehealth: Payer: Self-pay | Admitting: *Deleted

## 2020-07-21 NOTE — Telephone Encounter (Signed)
90 day call completed.

## 2020-07-22 ENCOUNTER — Encounter: Payer: Self-pay | Admitting: Orthopedic Surgery

## 2020-07-22 NOTE — Progress Notes (Signed)
Post-Op Visit Note   Patient: [name]           Date of Birth: [birth year]           MRN: [id number] Visit Date: 07/12/2020 PCP: Benito Mccreedy, MD   Assessment & Plan:  Chief Complaint:  Chief Complaint  Patient presents with  . Left Knee - Follow-up   Visit Diagnoses: No diagnosis found.  Plan: [name] is now about 3 months out left knee replacement.  She has been working on getting range of motion which is been difficult.  On exam today we can get her to about 90.  This is an improvement.  Collaterals are stable.  No calf tenderness negative Homans.  On her to continue to keep working on range of motion get a little bit more.  Would like to see her back in about 8 weeks for final check.  Hold off on manipulation at this time.  Follow-Up Instructions: No follow-ups on file.   Orders:  No orders of the defined types were placed in this encounter.  No orders of the defined types were placed in this encounter.   Imaging: No results found.  PMFS History: Patient Active Problem List   Diagnosis Date Noted  . Osteoarthritis of left knee 04/18/2020  . Tobacco use disorder 09/18/2018  . Iron deficiency anemia 04/09/2018  . Symptomatic anemia 12/26/2015  . Hypokalemia 12/26/2015  . Type 2 diabetes mellitus with peripheral vascular disease ([place]) 12/26/2015  . Claudication in peripheral vascular disease ([place]) 10/05/2013  . Discomfort in chest 05/11/2012  . Nicotine dependence 05/11/2012  . Bradycardia 05/11/2012  . HTN (hypertension) 05/11/2012  . Back pain 05/11/2012   Past Medical History:  Diagnosis Date  . Anemia 12/26/2015  . Anxiety   . Arthritis   . Blood transfusion without reported diagnosis   . Chronic pain    resolved per pt 04/12/20  . COPD (chronic obstructive pulmonary disease) ([place])   . Coronary artery disease   . Depression   . Diabetes mellitus without complication ([place])    type 2  . GERD (gastroesophageal reflux disease)   .  Heart murmur    since birth; Echo 02/18/19: LVEF 03%, grade 1 diastolic dysfunction, mild AS (mean grad 12 mmHg), trace MR/PR, mild TR, PASP 32 mmHg    . Hyperlipemia   . Hypertension   . PVD (peripheral vascular disease) ([place])    right SFA stent 02/11/17 by Dr. Einar Gip  . Sleep apnea    does not use cpap  . Wears glasses     Family History  Problem Relation Age of Onset  . Diabetes Mother   . Hypertension Mother   . Heart disease Mother   . Heart disease Father   . Hypertension Father   . Hypertension Sister   . Hypertension Brother   . Diabetes Brother   . Hypertension Sister   . Hypertension Brother   . Diabetes Brother   . Hypertension Brother   . Hypertension Brother   . Hypertension Brother     Past Surgical History:  Procedure Laterality Date  . ABDOMINAL HYSTERECTOMY    . CARDIAC CATHETERIZATION    . CATARACT EXTRACTION    . CESAREAN SECTION    . COLONOSCOPY N/A 01/08/2013   Procedure: COLONOSCOPY;  Surgeon: Beryle Beams, MD;  [facility];  Service: Endoscopy;  Laterality: N/A;  . COLONOSCOPY N/A 12/28/2015   Procedure: COLONOSCOPY;  Surgeon: Carol Ada, MD;  Location: [place];  Service: Endoscopy;  Laterality: N/A;  . ENTEROSCOPY N/A 12/28/2015   Procedure: ENTEROSCOPY;  Surgeon: Carol Ada, MD;  Location: [facility];  Service: Endoscopy;  Laterality: N/A;  . ENTEROSCOPY N/A 02/20/2018   Procedure: ENTEROSCOPY;  Surgeon: Carol Ada, MD;  Location: [facility];  Service: Endoscopy;  Laterality: N/A;  . ENTEROSCOPY N/A 03/27/2018   Procedure: ENTEROSCOPY;  Surgeon: Carol Ada, MD;  Location: [facility];  Service: Endoscopy;  Laterality: N/A;  . ESOPHAGOGASTRODUODENOSCOPY N/A 01/08/2013   Procedure: ESOPHAGOGASTRODUODENOSCOPY (EGD);  Surgeon: Beryle Beams, MD;  Location: [facility];  Service: Endoscopy;  Laterality: N/A;  . EYE SURGERY    . HAND SURGERY    . HOT HEMOSTASIS N/A 12/28/2015   Procedure: HOT HEMOSTASIS (ARGON PLASMA  COAGULATION/BICAP);  Surgeon: Carol Ada, MD;  Location: [facility] ENDOSCOPY;  Service: Endoscopy;  Laterality: N/A;  . HOT HEMOSTASIS N/A 02/20/2018   Procedure: HOT HEMOSTASIS (ARGON PLASMA COAGULATION/BICAP);  Surgeon: Carol Ada, MD;  Location: [facility];  Service: Endoscopy;  Laterality: N/A;  . HOT HEMOSTASIS N/A 03/27/2018   Procedure: HOT HEMOSTASIS (ARGON PLASMA COAGULATION/BICAP);  Surgeon: Carol Ada, MD;  Location: [facility];  Service: Endoscopy;  Laterality: N/A;  . LEFT HEART CATHETERIZATION WITH CORONARY ANGIOGRAM N/A 03/09/2013   Procedure: LEFT HEART CATHETERIZATION WITH CORONARY ANGIOGRAM;  Surgeon: Laverda Page, MD;  Location: [facility] CATH LAB;  Service: Cardiovascular;  Laterality: N/A;  . LOWER EXTREMITY ANGIOGRAM N/A 12/29/2012   Procedure: LOWER EXTREMITY ANGIOGRAM;  Surgeon: Laverda Page, MD;  Location: [facility] CATH LAB;  Service: Cardiovascular;  Laterality: N/A;  . LOWER EXTREMITY ANGIOGRAM N/A 10/05/2013   Procedure: LOWER EXTREMITY ANGIOGRAM;  Surgeon: Laverda Page, MD;  Location: [facility] CATH LAB;  Service: Cardiovascular;  Laterality: N/A;  . LOWER EXTREMITY ANGIOGRAPHY N/A 02/11/2017   Procedure: Lower Extremity Angiography;  Surgeon: Adrian Prows, MD;  Location: [place] CV LAB;  Service: Cardiovascular;  Laterality: N/A;  . PERIPHERAL VASCULAR BALLOON ANGIOPLASTY  02/11/2017   Procedure: PERIPHERAL VASCULAR BALLOON ANGIOPLASTY;  Surgeon: Adrian Prows, MD;  Location: [place] CV LAB;  Service: Cardiovascular;;  Right SFA  . PERIPHERAL VASCULAR INTERVENTION Right 02/11/2017   Procedure: PERIPHERAL VASCULAR INTERVENTION;  Surgeon: Adrian Prows, MD;  Location: [place] CV LAB;  Service: Cardiovascular;  Laterality: Right;  Rt SFA  . stent in right leg  12/29/2012   for blood clot, Dr. Nadyne Coombes  . TOTAL KNEE ARTHROPLASTY Left 04/18/2020  . TOTAL KNEE ARTHROPLASTY Left 04/18/2020   Procedure: LEFT TOTAL KNEE ARTHROPLASTY;  Surgeon: Meredith Pel, MD;  Location: [place];  Service: Orthopedics;  Laterality: Left;  . TYMPANOMASTOIDECTOMY Left 03/08/2014   Procedure: TYMPANOMASTOIDECTOMY LEFT;  Surgeon: Ascencion Dike, MD;  Location: [place];  Service: ENT;  Laterality: Left;  . TYMPANOMASTOIDECTOMY  2015  . UTERINE FIBROID SURGERY     Social History   Occupational History  . Not on file  Tobacco Use  . Smoking status: Current Every Day Smoker    Packs/day: 0.50    Years: 41.00    Pack years: 20.50    Types: Cigarettes  . Smokeless tobacco: Never Used  Vaping Use  . Vaping Use: Never used  Substance and Sexual Activity  . Alcohol use: Yes    Comment: occasional  . Drug use: Yes    Types: Marijuana    Comment: Last use 04/11/20  . Sexual activity: Not Currently    Birth control/protection: Surgical    Comment: Hysterectomy

## 2020-08-15 DIAGNOSIS — Z96652 Presence of left artificial knee joint: Secondary | ICD-10-CM | POA: Diagnosis not present

## 2020-08-27 ENCOUNTER — Other Ambulatory Visit: Payer: Self-pay | Admitting: Cardiology

## 2020-08-29 ENCOUNTER — Ambulatory Visit: Payer: Medicare Other | Admitting: Cardiology

## 2020-08-29 ENCOUNTER — Encounter: Payer: Self-pay | Admitting: Cardiology

## 2020-08-29 ENCOUNTER — Other Ambulatory Visit: Payer: Self-pay

## 2020-08-29 VITALS — BP 131/79 | HR 65 | Temp 98.0°F | Resp 17 | Ht 60.0 in | Wt 193.8 lb

## 2020-08-29 DIAGNOSIS — I739 Peripheral vascular disease, unspecified: Secondary | ICD-10-CM

## 2020-08-29 DIAGNOSIS — I2510 Atherosclerotic heart disease of native coronary artery without angina pectoris: Secondary | ICD-10-CM | POA: Diagnosis not present

## 2020-08-29 DIAGNOSIS — I10 Essential (primary) hypertension: Secondary | ICD-10-CM | POA: Diagnosis not present

## 2020-08-29 DIAGNOSIS — I350 Nonrheumatic aortic (valve) stenosis: Secondary | ICD-10-CM

## 2020-08-29 DIAGNOSIS — F17200 Nicotine dependence, unspecified, uncomplicated: Secondary | ICD-10-CM | POA: Diagnosis not present

## 2020-08-29 DIAGNOSIS — E119 Type 2 diabetes mellitus without complications: Secondary | ICD-10-CM

## 2020-08-29 DIAGNOSIS — E1140 Type 2 diabetes mellitus with diabetic neuropathy, unspecified: Secondary | ICD-10-CM | POA: Diagnosis not present

## 2020-08-29 DIAGNOSIS — E1151 Type 2 diabetes mellitus with diabetic peripheral angiopathy without gangrene: Secondary | ICD-10-CM

## 2020-08-29 DIAGNOSIS — Z0001 Encounter for general adult medical examination with abnormal findings: Secondary | ICD-10-CM | POA: Diagnosis not present

## 2020-08-29 DIAGNOSIS — R0989 Other specified symptoms and signs involving the circulatory and respiratory systems: Secondary | ICD-10-CM

## 2020-08-29 DIAGNOSIS — E7800 Pure hypercholesterolemia, unspecified: Secondary | ICD-10-CM | POA: Diagnosis not present

## 2020-08-29 DIAGNOSIS — E1165 Type 2 diabetes mellitus with hyperglycemia: Secondary | ICD-10-CM | POA: Diagnosis not present

## 2020-08-29 DIAGNOSIS — J449 Chronic obstructive pulmonary disease, unspecified: Secondary | ICD-10-CM | POA: Diagnosis not present

## 2020-08-29 DIAGNOSIS — E559 Vitamin D deficiency, unspecified: Secondary | ICD-10-CM | POA: Diagnosis not present

## 2020-08-29 DIAGNOSIS — K219 Gastro-esophageal reflux disease without esophagitis: Secondary | ICD-10-CM | POA: Diagnosis not present

## 2020-08-29 NOTE — Progress Notes (Signed)
[name] Date of Birth: [birth year] MRN: [id number] Primary Care Provider:Osei-Bonsu, Iona Beard, MD Former Cardiology Providers: Jeri Lager, APRN, FNP-C Primary Cardiologist: Rex Kras, DO, [facility] (established care 08/30/2019)  Date: 08/29/20 Last Office Visit: 03/01/2020  Chief Complaint  Patient presents with  . Follow-up    6 month    HPI  [name] is a 70 y.o.  female who presents to the office with a chief complaint of " 57-month follow-up for management of blood pressure." Patient's past medical history and cardiovascular risk factors include: Established coronary artery disease without angina pectoris, hypertension, hyperlipidemia, active smoking, peripheral artery disease status post PV angiogram with revascularization of the SFA, non-insulin-dependent diabetes, postmenopausal female, advanced age, obesity due to excess calories, history of prior GI bleed.  Patient presents today for 25-month follow-up given her history of hypertension and CAD.  Since last office visit patient has undergone left knee replacement in October 2021.  She is recovering well and is able to ambulate more.  She has lost another 13 pounds since last office visit due to lifestyle changes and increasing physical activity.  She denies any chest pain or anginal equivalents.  No use of sublingual nitroglycerin tablets since last office visit.  She has baseline shortness of breath with effort related activities which is chronic and stable and multifactorial.  Patient states that she had an office visit with her PCP earlier today and lab work was ordered.  We will await the results.  Unable to perform an accurate medication reconciliation as the patient did not bring her cardiac medications with her or her medication list.  There are multiple medications that were present when she was last seen back in September but currently are not on the medication list.  She is asked to call the  office back to make sure she is on appropriate cardiac medications.  ALLERGIES: No Known Allergies   MEDICATION LIST PRIOR TO VISIT: Current Outpatient Medications on File Prior to Visit  Medication Sig Dispense Refill  . albuterol (PROVENTIL HFA;VENTOLIN HFA) 108 (90 Base) MCG/ACT inhaler Inhale 1-2 puffs into the lungs every 6 (six) hours as needed for wheezing or shortness of breath.    Marland Kitchen amLODipine (NORVASC) 10 MG tablet Take 10 mg by mouth daily.     . ASPIRIN LOW DOSE 81 MG EC tablet TAKE 1 TABLET(81 MG) BY MOUTH DAILY 30 tablet 0  . gabapentin (NEURONTIN) 600 MG tablet Take 1 tablet (600 mg total) by mouth at bedtime. (Patient taking differently: Take 600 mg by mouth at bedtime as needed (pain).) 30 tablet 11  . hydrALAZINE (APRESOLINE) 100 MG tablet TAKE 1 TABLET BY MOUTH THREE TIMES DAILY 270 tablet 1  . hydrOXYzine (VISTARIL) 50 MG capsule Take 50 mg by mouth at bedtime.    . metFORMIN (GLUCOPHAGE) 500 MG tablet Take 0.5 tablets (250 mg total) by mouth 2 (two) times daily with a meal.    . nitroGLYCERIN (NITROSTAT) 0.4 MG SL tablet Place 1 tablet (0.4 mg total) under the tongue every 5 (five) minutes as needed for chest pain. 90 tablet 3   No current facility-administered medications on file prior to visit.    PAST MEDICAL HISTORY: Past Medical History:  Diagnosis Date  . Anemia 12/26/2015  . Anxiety   . Arthritis   . Blood transfusion without reported diagnosis   . Chronic pain    resolved per pt 04/12/20  . COPD (chronic obstructive pulmonary disease) ([place])   . Coronary artery disease   .  Depression   . Diabetes mellitus without complication ([place])    type 2  . GERD (gastroesophageal reflux disease)   . Heart murmur    since birth; Echo 02/18/19: LVEF 69%, grade 1 diastolic dysfunction, mild AS (mean grad 12 mmHg), trace MR/PR, mild TR, PASP 32 mmHg    . Hyperlipemia   . Hypertension   . PVD (peripheral vascular disease) ([place])    right SFA stent 02/11/17 by Dr. Einar Gip   . Sleep apnea    does not use cpap  . Wears glasses     PAST SURGICAL HISTORY: Past Surgical History:  Procedure Laterality Date  . ABDOMINAL HYSTERECTOMY    . CARDIAC CATHETERIZATION    . CATARACT EXTRACTION    . CESAREAN SECTION    . COLONOSCOPY N/A 01/08/2013   Procedure: COLONOSCOPY;  Surgeon: Beryle Beams, MD;  Location: [facility] ENDOSCOPY;  Service: Endoscopy;  Laterality: N/A;  . COLONOSCOPY N/A 12/28/2015   Procedure: COLONOSCOPY;  Surgeon: Carol Ada, MD;  Location: [facility] ENDOSCOPY;  Service: Endoscopy;  Laterality: N/A;  . ENTEROSCOPY N/A 12/28/2015   Procedure: ENTEROSCOPY;  Surgeon: Carol Ada, MD;  Location: [place];  Service: Endoscopy;  Laterality: N/A;  . ENTEROSCOPY N/A 02/20/2018   Procedure: ENTEROSCOPY;  Surgeon: Carol Ada, MD;  Location: [facility] ENDOSCOPY;  Service: Endoscopy;  Laterality: N/A;  . ENTEROSCOPY N/A 03/27/2018   Procedure: ENTEROSCOPY;  Surgeon: Carol Ada, MD;  Location: [facility] ENDOSCOPY;  Service: Endoscopy;  Laterality: N/A;  . ESOPHAGOGASTRODUODENOSCOPY N/A 01/08/2013   Procedure: ESOPHAGOGASTRODUODENOSCOPY (EGD);  Surgeon: Beryle Beams, MD;  Location: [facility] END[facility];  Service: Endoscopy;  Laterality: N/A;  . EYE SURGERY    . HAND SURGERY    . HOT HEMOSTASIS N/A 12/28/2015   Procedure: HOT HEMOSTASIS (ARGON PLASMA COAGULATION/BICAP);  Surgeon: Carol Ada, MD;  Location: [facility] ENDOSCOPY;  Service: Endoscopy;  Laterality: N/A;  . HOT HEMOSTASIS N/A 02/20/2018   Procedure: HOT HEMOSTASIS (ARGON PLASMA COAGULATION/BICAP);  Surgeon: Carol Ada, MD;  Location: [facility] ENDOSCOPY;  Service: Endoscopy;  Laterality: N/A;  . HOT HEMOSTASIS N/A 03/27/2018   Procedure: HOT HEMOSTASIS (ARGON PLASMA COAGULATION/BICAP);  Surgeon: Carol Ada, MD;  Location: [facility] ENDOSCOPY;  Service: Endoscopy;  Laterality: N/A;  . LEFT HEART CATHETERIZATION WITH CORONARY ANGIOGRAM N/A 03/09/2013   Procedure: LEFT HEART CATHETERIZATION WITH CORONARY ANGIOGRAM;  Surgeon: Laverda Page, MD;   Location: [facility] CATH LAB;  Service: Cardiovascular;  Laterality: N/A;  . LOWER EXTREMITY ANGIOGRAM N/A 12/29/2012   Procedure: LOWER EXTREMITY ANGIOGRAM;  Surgeon: Laverda Page, MD;  Location: [facility] CATH LAB;  Service: Cardiovascular;  Laterality: N/A;  . LOWER EXTREMITY ANGIOGRAM N/A 10/05/2013   Procedure: LOWER EXTREMITY ANGIOGRAM;  Surgeon: Laverda Page, MD;  Location: [facility] CATH LAB;  Service: Cardiovascular;  Laterality: N/A;  . LOWER EXTREMITY ANGIOGRAPHY N/A 02/11/2017   Procedure: Lower Extremity Angiography;  Surgeon: Adrian Prows, MD;  Location: [place] CV LAB;  Service: Cardiovascular;  Laterality: N/A;  . PERIPHERAL VASCULAR BALLOON ANGIOPLASTY  02/11/2017   Procedure: PERIPHERAL VASCULAR BALLOON ANGIOPLASTY;  Surgeon: Adrian Prows, MD;  Location: [place] CV LAB;  Service: Cardiovascular;;  Right SFA  . PERIPHERAL VASCULAR INTERVENTION Right 02/11/2017   Procedure: PERIPHERAL VASCULAR INTERVENTION;  Surgeon: Adrian Prows, MD;  Location: [place] CV LAB;  Service: Cardiovascular;  Laterality: Right;  Rt SFA  . stent in right leg  12/29/2012   for blood clot, Dr. Nadyne Coombes  . TOTAL KNEE ARTHROPLASTY Left 04/18/2020  . TOTAL KNEE ARTHROPLASTY Left 04/18/2020  Procedure: LEFT TOTAL KNEE ARTHROPLASTY;  Surgeon: Meredith Pel, MD;  Location: [place];  Service: Orthopedics;  Laterality: Left;  . TYMPANOMASTOIDECTOMY Left 03/08/2014   Procedure: TYMPANOMASTOIDECTOMY LEFT;  Surgeon: Ascencion Dike, MD;  Location: [place];  Service: ENT;  Laterality: Left;  . TYMPANOMASTOIDECTOMY  2015  . UTERINE FIBROID SURGERY      FAMILY HISTORY: The patient's family history includes Diabetes in her brother, brother, and mother; Heart disease in her father and mother; Hypertension in her brother, brother, brother, brother, brother, father, mother, sister, and sister.   SOCIAL HISTORY:  The patient  reports that she has been smoking cigarettes. She has a 20.50 pack-year smoking history.  She has never used smokeless tobacco. She reports current alcohol use. She reports current drug use. Drugs: Marijuana and Cocaine.  Review of Systems  Constitutional: Positive for weight loss. Negative for chills and fever.  HENT: Negative for hoarse voice and nosebleeds.   Eyes: Negative for discharge, double vision and pain.  Cardiovascular: Positive for dyspnea on exertion (chronic). Negative for chest pain, claudication, leg swelling, near-syncope, orthopnea, palpitations, paroxysmal nocturnal dyspnea and syncope.  Respiratory: Negative for hemoptysis and shortness of breath.   Musculoskeletal: Positive for arthritis and joint pain. Negative for muscle cramps and myalgias.  Gastrointestinal: Negative for abdominal pain, constipation, diarrhea, hematemesis, hematochezia, melena, nausea and vomiting.  Neurological: Negative for dizziness and light-headedness.    PHYSICAL EXAM: Vitals with BMI 08/29/2020 04/20/2020 04/20/2020  Height 5\' 0"  - -  Weight 193 lbs 13 oz - -  BMI 25.36 - -  Systolic 644 034 742  Diastolic 79 71 67  Pulse 65 89 91    CONSTITUTIONAL: Well-developed and well-nourished. No acute distress.  SKIN: Skin is warm and dry. No rash noted. No cyanosis. No pallor. No jaundice HEAD: Normocephalic and atraumatic.  EYES: No scleral icterus MOUTH/THROAT: Moist oral membranes.  NECK: No JVD present. No thyromegaly noted.  Left carotid bruits  LYMPHATIC: No visible cervical adenopathy.  CHEST Normal respiratory effort. No intercostal retractions  LUNGS: Clear to auscultation bilaterally.  No stridor. No wheezes. No rales.  CARDIOVASCULAR: Regular, positive V9-D6, soft systolic ejection murmur, no gallops or rubs. ABDOMINAL: Obese, soft, nontender, nondistended, positive bowel sounds in all 4 quadrants, no apparent ascites.  EXTREMITIES: No peripheral edema, warm to touch bilaterally.  Diminished DP/PT pulses bilaterally.  HEMATOLOGIC: No significant bruising NEUROLOGIC:  Oriented to person, place, and time. Nonfocal. Normal muscle tone.  PSYCHIATRIC: Normal mood and affect. Normal behavior. Cooperative  CARDIAC DATABASE: EKG: 03/01/2020:Sinus  Rhythm, 63bpm, normal axis, PRWP, without underlying ischemia or injury pattern. Frequent PACs.  08/29/2020: Normal sinus rhythm, 60 bpm, normal axis, occasional PACs, without underlying injury pattern.    Echocardiogram: PCV ECHOCARDIOGRAM COMPLETE 02/17/2019 Left ventricle cavity is normal in size. Mild concentric hypertrophy of the left ventricle. Normal LV systolic function with EF 68%. Normal global wall motion. Doppler evidence of grade I (impaired) diastolic dysfunction, normal LAP. Calculated EF 68%. Trileaflet aortic valve with mild calcification. Mild aortic valve stenosis. Aortic valve mean gradient of 12 mmHg, Vmax of 2.4  m/s. Calculated aortic valve area by continuity equation is 1.4 cm. No regurgitation. Trace mitral regurgitation. Trace pulmonic regurgitation. Mild tricuspid regurgitation. Estimated pulmonary artery systolic pressure is 32 mmHg.   Stress Testing:  Exercise myoview stress 05/19/2017: 1. Patient achieved 4.4 METS and reached 83% target heart rate. Exercise capacity low for age. Appropriate heart rate and blood pressure response. Stress symptoms included dyspnea,  dizziness. Submaximal exercise stress study. 2. No stress EKG changes suggestive of ischemia. 3. Stress and rest SPECT images demonstrate homogeneous tracer distribution throughout the myocardium. Gated SPECT imaging reveals normal myocardial thickening and wall motion. The left ventricular ejection fraction was normal (65%).  4. This is a low risk submaximal exercise stress study  Heart Catheterization: Coronary angiogram 02/27/2014:Mid RCA 60-70% stenosis, mid LAD 60-70% stenosis. FFR to the LAD, lesion insignificant. Medical therapy for CAD. Moderate aortic valve calcification.  Carotid artery duplex 07/25/2015:Antegrade right  vertebral artery flow. Antegrade left vertebral artery flow. No hemodynamically significant arterial disease in the internal carotid artery bilaterally. No significant change from 11/26/2012.  Peripheral arteriogram 02/11/2017:Successful PTA with DCB 5x150x2 InPact Admiral in the right proximal to distal SFA and stenting of proximal SFA with DES, [place] 6x100 mm stent. 3 vessel r/o. Left mild disease and 2 vessel R/O. Mild disease left by angiogram 10/05/2013.   Lower extremity arterial duplex 04/15/2018: No hemodynamically significant stenoses are identified in the lower extremity arterial system. Mild diffuse calcific plaque noted. This exam reveals mildly decreased perfusion of the right lower extremity, noted at the anterior tibial artery level (ABI 0.86) and normal perfusion of the left lower extremity (ABI 0.97). No signficant change since 05/28/2017. Study suggests patent right SFA angioplasty site  LABORATORY DATA: CBC Latest Ref Rng & Units 04/12/2020 04/05/2020 07/04/2019  WBC 4.0 - 10.5 K/uL 7.7 7.6 7.8  Hemoglobin 12.0 - 15.0 g/dL 12.2 11.3(L) 9.5(L)  Hematocrit 36.0 - 46.0 % 41.0 36.4 34.3(L)  Platelets 150 - 400 K/uL 527(H) 460(H) 542(H)    CMP Latest Ref Rng & Units 04/12/2020 04/05/2020 07/23/2019  Glucose 70 - 99 mg/dL 118(H) 94 91  BUN 8 - 23 mg/dL 10 10 17   Creatinine 0.44 - 1.00 mg/dL 0.73 0.75 0.91  Sodium 135 - 145 mmol/L 140 140 142  Potassium 3.5 - 5.1 mmol/L 3.8 3.9 4.3  Chloride 98 - 111 mmol/L 105 108 100  CO2 22 - 32 mmol/L 27 23 24   Calcium 8.9 - 10.3 mg/dL 9.7 9.0 10.3  Total Protein 6.5 - 8.1 g/dL - 7.3 -  Total Bilirubin 0.3 - 1.2 mg/dL - 0.4 -  Alkaline Phos 38 - 126 U/L - 69 -  AST 15 - 41 U/L - 14(L) -  ALT 0 - 44 U/L - 9 -    Lipid Panel     Component Value Date/Time   CHOL 166 05/12/2012 0237   TRIG 196 (H) 05/12/2012 0237   HDL 37 (L) 05/12/2012 0237   CHOLHDL 4.5 05/12/2012 0237   VLDL 39 05/12/2012 0237   LDLCALC 90 05/12/2012 0237     Lab Results  Component Value Date   HGBA1C 6.3 (H) 04/12/2020   HGBA1C 5.6 12/26/2015   HGBA1C 6.7 (H) 01/10/2011   No components found for: NTPROBNP Lab Results  Component Value Date   TSH 1.528 05/11/2012   TSH 2.688 01/10/2011   TSH 1.637 12/18/2007    Cardiac Panel (last 3 results) No results for input(s): CKTOTAL, CKMB, TROPONINIHS, RELINDX in the last 72 hours.  IMPRESSION:    ICD-10-CM   1. Essential hypertension  I10 EKG 12-Lead  2. Atherosclerosis of native coronary artery of native heart without angina pectoris  I25.10   3. Non-insulin dependent type 2 diabetes mellitus ([place])  E11.9   4. PAD (peripheral artery disease) ([facility])  I73.9   5. Tobacco use disorder  F17.200   6. Type 2 diabetes mellitus with peripheral vascular disease (  [place])  E11.51   7. Bruit of left carotid artery  R09.89 PCV CAROTID DUPLEX (BILATERAL)  8. Nonrheumatic aortic valve stenosis  I35.0 PCV ECHOCARDIOGRAM COMPLETE     RECOMMENDATIONS: [name] is a 70 y.o. female whose past medical history and cardiovascular risk factors include: Established coronary artery disease without angina pectoris, hypertension, hyperlipidemia, active smoking, peripheral artery disease status post PV angiogram with revascularization of the SFA, non-insulin-dependent diabetes, postmenopausal female, advanced age, obesity due to excess calories, history of prior GI bleed.  Atherosclerosis of the native coronary artery without angina pectoris: Patient has not had any precordial chest pain or angina pectoris since last office visit. She has not required the use of sublingual nitroglycerin tablets. She focuses on lifestyle modifications to improve her modifiable cardiovascular risk factors.  She has lost an additional 13 pounds since last office visit with lifestyle changes. Unable to accurately perform medication reconciliation as she does not recall her medications and does not have a list with her at  today's visit.  She is encouraged to call us back to go over her medications to make sure she is on appropriate guideline directed medical therapy.  Benign essential hypertension: Office blood pressure well controlled. Patient had blood work done at her PCPs office earlier today.  Will try to obtain a copy for her records. Low-salt diet recommended.  Left carotid bruit: Check carotid duplex to evaluate for carotid artery atherosclerosis.  Mild aortic stenosis: Last echocardiogram was in 2020.  Patient will be scheduled for a repeat echocardiogram in 2023 for her 3-year follow-up. Patient denies 3 cardinal symptoms of aortic stenosis such as chest pain, syncope, and congestive heart failure. Monitor for now  Non-insulin-dependent diabetes mellitus type 2: Educated on the importance of glycemic control.  Currently managed by primary care provider.  Peripheral artery disease: Patient has undergone peripheral intervention in the past.  She currently denies symptoms of claudication.   Should be on aspirin and statin therapy educated on the importance of complete smoking cessation.  Monitor for now.  Active cigarette smoker:  Tobacco cessation counseling: Currently smoking 0.5 packs/day   Patient was informed of the dangers of tobacco abuse including stroke, cancer, and MI, as well as benefits of tobacco cessation. Patient is willing to quit at this time. Approximately 7 mins were spent counseling patient cessation techniques. We discussed various methods to help quit smoking, including deciding on a date to quit, joining a support group, pharmacological agents- nicotine gum/patch/lozenges, chantix.  I will reassess her progress at the next follow-up visit   FINAL MEDICATION LIST END OF ENCOUNTER: No orders of the defined types were placed in this encounter.    Current Outpatient Medications:  .  albuterol (PROVENTIL HFA;VENTOLIN HFA) 108 (90 Base) MCG/ACT inhaler, Inhale 1-2 puffs into  the lungs every 6 (six) hours as needed for wheezing or shortness of breath., Disp: , Rfl:  .  amLODipine (NORVASC) 10 MG tablet, Take 10 mg by mouth daily. , Disp: , Rfl:  .  ASPIRIN LOW DOSE 81 MG EC tablet, TAKE 1 TABLET(81 MG) BY MOUTH DAILY, Disp: 30 tablet, Rfl: 0 .  gabapentin (NEURONTIN) 600 MG tablet, Take 1 tablet (600 mg total) by mouth at bedtime. (Patient taking differently: Take 600 mg by mouth at bedtime as needed (pain).), Disp: 30 tablet, Rfl: 11 .  hydrALAZINE (APRESOLINE) 100 MG tablet, TAKE 1 TABLET BY MOUTH THREE TIMES DAILY, Disp: 270 tablet, Rfl: 1 .  hydrOXYzine (VISTARIL) 50 MG capsule, Take 50  mg by mouth at bedtime., Disp: , Rfl:  .  metFORMIN (GLUCOPHAGE) 500 MG tablet, Take 0.5 tablets (250 mg total) by mouth 2 (two) times daily with a meal., Disp: , Rfl:  .  nitroGLYCERIN (NITROSTAT) 0.4 MG SL tablet, Place 1 tablet (0.4 mg total) under the tongue every 5 (five) minutes as needed for chest pain., Disp: 90 tablet, Rfl: 3  Orders Placed This Encounter  Procedures  . EKG 12-Lead  . PCV ECHOCARDIOGRAM COMPLETE  . PCV CAROTID DUPLEX (BILATERAL)   --Continue cardiac medications as reconciled in final medication list. --Return in about 6 months (around 03/01/2021) for Follow up, CAD, Review test results. Or sooner if needed. --Continue follow-up with your primary care physician regarding the management of your other chronic comorbid conditions.  Patient's questions and concerns were addressed to her satisfaction. She voices understanding of the instructions provided during this encounter.   This note was created using a voice recognition software as a result there may be grammatical errors inadvertently enclosed that do not reflect the nature of this encounter. Every attempt is made to correct such errors.  Total time spent: 35 minutes.  Rex Kras, [place], [facility]  Pager: [phone] Office: [phone]

## 2020-08-30 ENCOUNTER — Telehealth: Payer: Self-pay

## 2020-08-30 NOTE — Telephone Encounter (Signed)
Called patient this morning to go over her medication. Patient med list is updated

## 2020-09-20 DIAGNOSIS — K31811 Angiodysplasia of stomach and duodenum with bleeding: Secondary | ICD-10-CM | POA: Diagnosis not present

## 2020-09-20 DIAGNOSIS — Z86010 Personal history of colonic polyps: Secondary | ICD-10-CM | POA: Diagnosis not present

## 2020-09-22 ENCOUNTER — Other Ambulatory Visit: Payer: Self-pay | Admitting: Gastroenterology

## 2020-09-29 NOTE — Progress Notes (Signed)
Attempted to obtain medical history via telephone, unable to reach at this time. Unable to leave a message at this time.

## 2020-10-02 ENCOUNTER — Other Ambulatory Visit (HOSPITAL_COMMUNITY)
Admission: RE | Admit: 2020-10-02 | Discharge: 2020-10-02 | Disposition: A | Discharge status: Home / Self Care | Payer: Medicare Other | Source: Ambulatory Visit | Attending: Gastroenterology | Admitting: Gastroenterology

## 2020-10-02 DIAGNOSIS — E7800 Pure hypercholesterolemia, unspecified: Secondary | ICD-10-CM | POA: Diagnosis not present

## 2020-10-02 DIAGNOSIS — Z01812 Encounter for preprocedural laboratory examination: Secondary | ICD-10-CM | POA: Insufficient documentation

## 2020-10-02 DIAGNOSIS — E559 Vitamin D deficiency, unspecified: Secondary | ICD-10-CM | POA: Diagnosis not present

## 2020-10-02 DIAGNOSIS — Z20822 Contact with and (suspected) exposure to covid-19: Secondary | ICD-10-CM | POA: Diagnosis not present

## 2020-10-02 DIAGNOSIS — J449 Chronic obstructive pulmonary disease, unspecified: Secondary | ICD-10-CM | POA: Diagnosis not present

## 2020-10-02 DIAGNOSIS — F17200 Nicotine dependence, unspecified, uncomplicated: Secondary | ICD-10-CM | POA: Diagnosis not present

## 2020-10-02 DIAGNOSIS — K219 Gastro-esophageal reflux disease without esophagitis: Secondary | ICD-10-CM | POA: Diagnosis not present

## 2020-10-02 DIAGNOSIS — D500 Iron deficiency anemia secondary to blood loss (chronic): Secondary | ICD-10-CM | POA: Diagnosis not present

## 2020-10-02 DIAGNOSIS — E1140 Type 2 diabetes mellitus with diabetic neuropathy, unspecified: Secondary | ICD-10-CM | POA: Diagnosis not present

## 2020-10-03 LAB — SARS CORONAVIRUS 2 (TAT 6-24 HRS): SARS Coronavirus 2: NEGATIVE

## 2020-10-05 ENCOUNTER — Ambulatory Visit (HOSPITAL_COMMUNITY): Payer: Medicare Other | Admitting: Certified Registered"

## 2020-10-05 ENCOUNTER — Other Ambulatory Visit: Payer: Self-pay

## 2020-10-05 ENCOUNTER — Encounter (HOSPITAL_COMMUNITY)
Admission: RE | Disposition: A | Discharge status: Home / Self Care | Payer: Self-pay | Source: Home / Self Care | Attending: Gastroenterology

## 2020-10-05 ENCOUNTER — Encounter (HOSPITAL_COMMUNITY): Payer: Self-pay | Admitting: Gastroenterology

## 2020-10-05 ENCOUNTER — Ambulatory Visit (HOSPITAL_COMMUNITY)
Admission: RE | Admit: 2020-10-05 | Discharge: 2020-10-05 | Disposition: A | Discharge status: Home / Self Care | Payer: Medicare Other | Attending: Gastroenterology | Admitting: Gastroenterology

## 2020-10-05 DIAGNOSIS — K5730 Diverticulosis of large intestine without perforation or abscess without bleeding: Secondary | ICD-10-CM | POA: Diagnosis not present

## 2020-10-05 DIAGNOSIS — Z95820 Peripheral vascular angioplasty status with implants and grafts: Secondary | ICD-10-CM | POA: Diagnosis not present

## 2020-10-05 DIAGNOSIS — K31819 Angiodysplasia of stomach and duodenum without bleeding: Secondary | ICD-10-CM | POA: Diagnosis not present

## 2020-10-05 DIAGNOSIS — F17210 Nicotine dependence, cigarettes, uncomplicated: Secondary | ICD-10-CM | POA: Diagnosis not present

## 2020-10-05 DIAGNOSIS — D509 Iron deficiency anemia, unspecified: Secondary | ICD-10-CM | POA: Diagnosis not present

## 2020-10-05 DIAGNOSIS — D123 Benign neoplasm of transverse colon: Secondary | ICD-10-CM | POA: Diagnosis not present

## 2020-10-05 DIAGNOSIS — Z1211 Encounter for screening for malignant neoplasm of colon: Secondary | ICD-10-CM | POA: Insufficient documentation

## 2020-10-05 DIAGNOSIS — Z96652 Presence of left artificial knee joint: Secondary | ICD-10-CM | POA: Insufficient documentation

## 2020-10-05 DIAGNOSIS — Z79899 Other long term (current) drug therapy: Secondary | ICD-10-CM | POA: Diagnosis not present

## 2020-10-05 DIAGNOSIS — K5520 Angiodysplasia of colon without hemorrhage: Secondary | ICD-10-CM | POA: Insufficient documentation

## 2020-10-05 DIAGNOSIS — D122 Benign neoplasm of ascending colon: Secondary | ICD-10-CM | POA: Diagnosis not present

## 2020-10-05 DIAGNOSIS — E876 Hypokalemia: Secondary | ICD-10-CM | POA: Diagnosis not present

## 2020-10-05 DIAGNOSIS — K635 Polyp of colon: Secondary | ICD-10-CM | POA: Diagnosis not present

## 2020-10-05 DIAGNOSIS — M1712 Unilateral primary osteoarthritis, left knee: Secondary | ICD-10-CM | POA: Diagnosis not present

## 2020-10-05 DIAGNOSIS — Z86010 Personal history of colonic polyps: Secondary | ICD-10-CM | POA: Diagnosis not present

## 2020-10-05 HISTORY — PX: COLONOSCOPY WITH PROPOFOL: SHX5780

## 2020-10-05 HISTORY — PX: POLYPECTOMY: SHX5525

## 2020-10-05 HISTORY — PX: ENTEROSCOPY: SHX5533

## 2020-10-05 HISTORY — PX: HOT HEMOSTASIS: SHX5433

## 2020-10-05 LAB — GLUCOSE, CAPILLARY: Glucose-Capillary: 90 mg/dL (ref 70–99)

## 2020-10-05 SURGERY — ENTEROSCOPY
Anesthesia: Monitor Anesthesia Care

## 2020-10-05 MED ORDER — LACTATED RINGERS IV SOLN: INTRAVENOUS | Status: DC

## 2020-10-05 MED ORDER — SODIUM CHLORIDE 0.9 % IV SOLN: INTRAVENOUS | Status: DC

## 2020-10-05 MED ORDER — PROPOFOL 500 MG/50ML IV EMUL
INTRAVENOUS | Status: DC | PRN
  Administered 2020-10-05: 150 ug/kg/min via INTRAVENOUS

## 2020-10-05 MED ORDER — GLUCAGON HCL RDNA (DIAGNOSTIC) 1 MG IJ SOLR
INTRAMUSCULAR | Status: DC | PRN
  Administered 2020-10-05: .5 mg via INTRAVENOUS

## 2020-10-05 SURGICAL SUPPLY — 22 items

## 2020-10-05 NOTE — H&P (Signed)
[name] HPI: The patient was recently noted to have a recurrent IDA in the setting of AVMs.  She is also here for a screening colonoscopy.  Past Medical History:  Diagnosis Date  . Anemia 12/26/2015  . Anxiety   . Arthritis   . Blood transfusion without reported diagnosis   . Chronic pain    resolved per pt 04/12/20  . COPD (chronic obstructive pulmonary disease) ([place])   . Coronary artery disease   . Depression   . Diabetes mellitus without complication ([place])    type 2  . GERD (gastroesophageal reflux disease)   . Heart murmur    since birth; Echo 02/18/19: LVEF 62%, grade 1 diastolic dysfunction, mild AS (mean grad 12 mmHg), trace MR/PR, mild TR, PASP 32 mmHg    . Hyperlipemia   . Hypertension   . PVD (peripheral vascular disease) ([place])    right SFA stent 02/11/17 by Dr. Einar Gip  . Sleep apnea    does not use cpap  . Wears glasses     Past Surgical History:  Procedure Laterality Date  . ABDOMINAL HYSTERECTOMY    . CARDIAC CATHETERIZATION    . CATARACT EXTRACTION    . CESAREAN SECTION    . COLONOSCOPY N/A 01/08/2013   Procedure: COLONOSCOPY;  Surgeon: Beryle Beams, MD;  Location: [facility] ENDOSCOPY;  Service: Endoscopy;  Laterality: N/A;  . COLONOSCOPY N/A 12/28/2015   Procedure: COLONOSCOPY;  Surgeon: Carol Ada, MD;  Location: [facility] ENDOSCOPY;  Service: Endoscopy;  Laterality: N/A;  . ENTEROSCOPY N/A 12/28/2015   Procedure: ENTEROSCOPY;  Surgeon: Carol Ada, MD;  Location: [facility];  Service: Endoscopy;  Laterality: N/A;  . ENTEROSCOPY N/A 02/20/2018   Procedure: ENTEROSCOPY;  Surgeon: Carol Ada, MD;  Location: [facility] ENDOSCOPY;  Service: Endoscopy;  Laterality: N/A;  . ENTEROSCOPY N/A 03/27/2018   Procedure: ENTEROSCOPY;  Surgeon: Carol Ada, MD;  Location: [facility] ENDOSCOPY;  Service: Endoscopy;  Laterality: N/A;  . ESOPHAGOGASTRODUODENOSCOPY N/A 01/08/2013   Procedure: ESOPHAGOGASTRODUODENOSCOPY (EGD);  Surgeon: Beryle Beams, MD;  Location: [facility] END[facility];   Service: Endoscopy;  Laterality: N/A;  . EYE SURGERY    . HAND SURGERY    . HOT HEMOSTASIS N/A 12/28/2015   Procedure: HOT HEMOSTASIS (ARGON PLASMA COAGULATION/BICAP);  Surgeon: Carol Ada, MD;  Location: [facility] ENDOSCOPY;  Service: Endoscopy;  Laterality: N/A;  . HOT HEMOSTASIS N/A 02/20/2018   Procedure: HOT HEMOSTASIS (ARGON PLASMA COAGULATION/BICAP);  Surgeon: Carol Ada, MD;  Location: [facility] ENDOSCOPY;  Service: Endoscopy;  Laterality: N/A;  . HOT HEMOSTASIS N/A 03/27/2018   Procedure: HOT HEMOSTASIS (ARGON PLASMA COAGULATION/BICAP);  Surgeon: Carol Ada, MD;  Location: [facility] ENDOSCOPY;  Service: Endoscopy;  Laterality: N/A;  . LEFT HEART CATHETERIZATION WITH CORONARY ANGIOGRAM N/A 03/09/2013   Procedure: LEFT HEART CATHETERIZATION WITH CORONARY ANGIOGRAM;  Surgeon: Laverda Page, MD;  Location: [facility] CATH LAB;  Service: Cardiovascular;  Laterality: N/A;  . LOWER EXTREMITY ANGIOGRAM N/A 12/29/2012   Procedure: LOWER EXTREMITY ANGIOGRAM;  Surgeon: Laverda Page, MD;  Location: [facility] CATH LAB;  Service: Cardiovascular;  Laterality: N/A;  . LOWER EXTREMITY ANGIOGRAM N/A 10/05/2013   Procedure: LOWER EXTREMITY ANGIOGRAM;  Surgeon: Laverda Page, MD;  Location: [facility] C[facility] LAB;  Service: Cardiovascular;  Laterality: N/A;  . LOWER EXTREMITY ANGIOGRAPHY N/A 02/11/2017   Procedure: Lower Extremity Angiography;  Surgeon: Adrian Prows, MD;  Location: [place] CV LAB;  Service: Cardiovascular;  Laterality: N/A;  . PERIPHERAL VASCULAR BALLOON ANGIOPLASTY  02/11/2017   Procedure: PERIPHERAL VASCULAR BALLOON ANGIOPLASTY;  Surgeon: Adrian Prows, MD;  Location: [place] CV LAB;  Service: Cardiovascular;;  Right SFA  . PERIPHERAL VASCULAR INTERVENTION Right 02/11/2017   Procedure: PERIPHERAL VASCULAR INTERVENTION;  Surgeon: Adrian Prows, MD;  Location: [place] CV LAB;  Service: Cardiovascular;  Laterality: Right;  Rt SFA  . stent in right leg  12/29/2012   for blood clot, Dr. Nadyne Coombes  . TOTAL KNEE ARTHROPLASTY Left  04/18/2020  . TOTAL KNEE ARTHROPLASTY Left 04/18/2020   Procedure: LEFT TOTAL KNEE ARTHROPLASTY;  Surgeon: Meredith Pel, MD;  Location: [facility];  Service: Orthopedics;  Laterality: Left;  . TYMPANOMASTOIDECTOMY Left 03/08/2014   Procedure: TYMPANOMASTOIDECTOMY LEFT;  Surgeon: Ascencion Dike, MD;  Location: [facility];  Service: ENT;  Laterality: Left;  . TYMPANOMASTOIDECTOMY  2015  . UTERINE FIBROID SURGERY      Family History  Problem Relation Age of Onset  . Diabetes Mother   . Hypertension Mother   . Heart disease Mother   . Heart disease Father   . Hypertension Father   . Hypertension Sister   . Hypertension Brother   . Diabetes Brother   . Hypertension Sister   . Hypertension Brother   . Diabetes Brother   . Hypertension Brother   . Hypertension Brother   . Hypertension Brother     Social History:  reports that she has been smoking cigarettes. She has a 20.50 pack-year smoking history. She has never used smokeless tobacco. She reports current alcohol use. She reports current drug use. Drugs: Marijuana and Cocaine.  Allergies: No Known Allergies  Medications:  Scheduled:  Continuous: . sodium chloride    . lactated ringers 125 mL/hr at 10/05/20 1232    Results for orders placed or performed during the hospital encounter of 10/05/20 (from the past 24 hour(s))  Glucose, capillary     Status: None   Collection Time: 10/05/20 12:34 PM  Result Value Ref Range   Glucose-Capillary 90 70 - 99 mg/dL     No results found.  ROS:  As stated above in the HPI otherwise negative.  Blood pressure 130/70, pulse 61, temperature (!) 97 F (36.1 C), temperature source Oral, resp. rate (!) 21, height 5' (1.524 m), weight 88.9 kg, SpO2 98 %.    PE: Gen: NAD, Alert and Oriented HEENT:  [place]/AT, EOMI Neck: Supple, no LAD Lungs: CTA Bilaterally CV: RRR without M/G/R ABD: Soft, NTND, +BS Ext: No C/C/E  Assessment/Plan: 1) IDA. 2) History of small bowel AVMs. 3)  Screening colonoscopy.  Plan: 1) Enteroscopy with APC and colonoscopy.  Atthew Coutant D 10/05/2020, 12:54 PM

## 2020-10-05 NOTE — Transfer of Care (Signed)
Immediate Anesthesia Transfer of Care Note  Patient: [name]  Procedure(s) Performed: ENTEROSCOPY (N/A ) COLONOSCOPY WITH PROPOFOL (N/A ) HOT HEMOSTASIS (ARGON PLASMA COAGULATION/BICAP) (N/A ) POLYPECTOMY  Patient Location: PACU  Anesthesia Type:MAC  Level of Consciousness: awake, alert , oriented and patient cooperative  Airway & Oxygen Therapy: Patient Spontanous Breathing and Patient connected to face mask oxygen  Post-op Assessment: Report given to RN, Post -op Vital signs reviewed and stable and Patient moving all extremities  Post vital signs: Reviewed and stable  Last Vitals:  Vitals Value Taken Time  BP    Temp    Pulse    Resp 27 10/05/20 1415  SpO2    Vitals shown include unvalidated device data.  Last Pain:  Vitals:   10/05/20 1213  TempSrc: Oral  PainSc: 0-No pain         Complications: No complications documented.

## 2020-10-05 NOTE — Discharge Instructions (Signed)

## 2020-10-05 NOTE — Anesthesia Preprocedure Evaluation (Signed)
Anesthesia Evaluation  Patient identified by MRN, date of birth, ID band Patient awake    Reviewed: Allergy & Precautions, NPO status , Patient's Chart, lab work & pertinent test results  History of Anesthesia Complications Negative for: history of anesthetic complications  Airway Mallampati: III  TM Distance: >3 FB Neck ROM: Full    Dental no notable dental hx. (+) Dental Advisory Given,    Pulmonary sleep apnea , COPD, Current Smoker and Patient abstained from smoking.,  Covid-19 Nucleic Acid Test Results Lab Results      Component                Value               Date                      [place]              NEGATIVE            04/14/2020                [place]              NEGATIVE            07/04/2019              breath sounds clear to auscultation       Cardiovascular hypertension, Pt. on medications + Peripheral Vascular Disease  Normal cardiovascular exam Rhythm:Regular  Left ventricle: The cavity size was normal. Wall thickness  was normal. Systolic function was normal. The estimated  ejection fraction was in the range of 55% to 65%. Wall  motion was normal; there were no regional wall motion  abnormalities. Left ventricular diastolic function  parameters were normal. Tissue doppler is in the  indeterminant range to assess LA pressure.  - Aortic valve: Trileaflet; mildly thickened, mildly  calcified leaflets. Sclerosis without stenosis.  - Mitral valve: Mildly calcified annulus. Mildly thickened  leaflets .  - Right ventricle: Systolic pressure was increased.  - Atrial septum: No defect or patent foramen ovale was  identified.  - Pulmonary arteries: PA peak pressure: 17m Hg (S).  Impressions:     Neuro/Psych PSYCHIATRIC DISORDERS Anxiety Depression negative neurological ROS     GI/Hepatic Neg liver ROS, GERD  Controlled and Medicated,  Endo/Other  diabetes, Type 2, Oral  Hypoglycemic Agents  Renal/GU negative Renal ROSLab Results      Component                Value               Date                      CREATININE               0.73                04/12/2020                Musculoskeletal  (+) Arthritis ,   Abdominal (+) + obese,   Peds  Hematology Lab Results      Component                Value               Date                      WBC  7.7                 04/12/2020                HGB                      12.2                04/12/2020                HCT                      41.0                04/12/2020                MCV                      79.5 (L)            04/12/2020                PLT                      527 (H)             04/12/2020              Anesthesia Other Findings   Reproductive/Obstetrics                             Anesthesia Physical  Anesthesia Plan  ASA: II  Anesthesia Plan: MAC   Post-op Pain Management:    Induction:   PONV Risk Score and Plan: 1 and Propofol infusion  Airway Management Planned: Mask and Natural Airway  Additional Equipment: None  Intra-op Plan:   Post-operative Plan:   Informed Consent: I have reviewed the patients History and Physical, chart, labs and discussed the procedure including the risks, benefits and alternatives for the proposed anesthesia with the patient or authorized representative who has indicated his/her understanding and acceptance.     Dental advisory given  Plan Discussed with: CRNA  Anesthesia Plan Comments: ( )        Anesthesia Quick Evaluation

## 2020-10-05 NOTE — Anesthesia Postprocedure Evaluation (Signed)
Anesthesia Post Note  Patient: [name]  Procedure(s) Performed: ENTEROSCOPY (N/A ) COLONOSCOPY WITH PROPOFOL (N/A ) HOT HEMOSTASIS (ARGON PLASMA COAGULATION/BICAP) (N/A ) POLYPECTOMY     Patient location during evaluation: Endoscopy Anesthesia Type: MAC Level of consciousness: awake Pain management: pain level controlled Vital Signs Assessment: post-procedure vital signs reviewed and stable Respiratory status: spontaneous breathing Cardiovascular status: stable Postop Assessment: no apparent nausea or vomiting Anesthetic complications: no   No complications documented.  Last Vitals:  Vitals:   10/05/20 1420 10/05/20 1430  BP: (!) 152/40 (!) 143/55  Pulse: 66 (!) 59  Resp: (!) 22 20  Temp:    SpO2: 98% 91%    Last Pain:  Vitals:   10/05/20 1420  TempSrc:   PainSc: 0-No pain                 [name] Arnt

## 2020-10-05 NOTE — Op Note (Signed)
[facility] Patient Name: [name] Procedure Date: 10/05/2020 MRN: [id number] Attending MD: Carol Ada , MD Date of Birth: Oct 28, 1950 CSN: [id number] Age: 70 Admit Type: Outpatient Procedure:                Colonoscopy Indications:              High risk colon cancer surveillance: Personal                            history of colonic polyps Providers:                Carol Ada, MD, Carmie End, RN, Tyna Jaksch Technician Referring MD:              Medicines:                Propofol per Anesthesia Complications:            No immediate complications. Estimated Blood Loss:     Estimated blood loss: none. Procedure:                Pre-Anesthesia Assessment:                           - Prior to the procedure, a History and Physical                            was performed, and patient medications and                            allergies were reviewed. The patient's tolerance of                            previous anesthesia was also reviewed. The risks                            and benefits of the procedure and the sedation                            options and risks were discussed with the patient.                            All questions were answered, and informed consent                            was obtained. Prior Anticoagulants: The patient has                            taken no previous anticoagulant or antiplatelet                            agents. ASA Grade Assessment: II - A patient with                            mild systemic  disease. After reviewing the risks                            and benefits, the patient was deemed in                            satisfactory condition to undergo the procedure.                           - Sedation was administered by an anesthesia                            professional. Deep sedation was attained.                           After obtaining informed consent, the colonoscope                             was passed under direct vision. Throughout the                            procedure, the patient's blood pressure, pulse, and                            oxygen saturations were monitored continuously. The                            PCF-H190DL ([id number]) Olympus pediatric colonscope                            was introduced through the anus and advanced to the                            the cecum, identified by appendiceal orifice and                            ileocecal valve. The colonoscopy was performed                            without difficulty. The patient tolerated the                            procedure well. The quality of the bowel                            preparation was good. The ileocecal valve,                            appendiceal orifice, and rectum were photographed. Scope In: 1:48:43 PM Scope Out: 2:10:39 PM Total Procedure Duration: 0 hours 21 minutes 56 seconds  Findings:      Seven sessile polyps were found in the transverse colon, hepatic flexure       and ascending colon. The polyps were 2 to 7 mm in size. These polyps  were removed with a cold snare. Resection and retrieval were complete.      Scattered small and large-mouthed diverticula were found in the sigmoid       colon. Impression:               - Seven 2 to 7 mm polyps in the transverse colon,                            at the hepatic flexure and in the ascending colon,                            removed with a cold snare. Resected and retrieved.                           - Diverticulosis in the sigmoid colon. Moderate Sedation:      Not Applicable - Patient had care per Anesthesia. Recommendation:           - Patient has a contact number available for                            emergencies. The signs and symptoms of potential                            delayed complications were discussed with the                            patient. Return to normal activities  tomorrow.                            Written discharge instructions were provided to the                            patient.                           - Resume previous diet.                           - Continue present medications.                           - Await pathology results.                           - Repeat colonoscopy in 3 years for surveillance. Procedure Code(s):        --- Professional ---                           [phone], Colonoscopy, flexible; with removal of                            tumor(s), polyp(s), or other lesion(s) by snare                            technique Diagnosis Code(s):        --- Professional ---  K63.5, Polyp of colon                           Z86.010, Personal history of colonic polyps                           K57.30, Diverticulosis of large intestine without                            perforation or abscess without bleeding CPT copyright 2019 [employer]. All rights reserved. The codes documented in this report are preliminary and upon coder review may  be revised to meet current compliance requirements. Carol Ada, MD Carol Ada, MD 10/05/2020 2:17:21 PM This report has been signed electronically. Number of Addenda: 0

## 2020-10-05 NOTE — Op Note (Signed)
[facility] Patient Name: [name] Procedure Date: 10/05/2020 MRN: [id number] Attending MD: Carol Ada , MD Date of Birth: 07-Oct-1950 CSN: [id number] Age: 70 Admit Type: Outpatient Procedure:                Small bowel enteroscopy Indications:              For therapy of angiodysplasia (of intestine) Providers:                Carol Ada, MD, Truddie Coco, RN, Tyna Jaksch                            Technician Referring MD:              Medicines:                Propofol per Anesthesia Complications:            No immediate complications. Estimated Blood Loss:     Estimated blood loss: none. Procedure:                Pre-Anesthesia Assessment:                           - Prior to the procedure, a History and Physical                            was performed, and patient medications and                            allergies were reviewed. The patient's tolerance of                            previous anesthesia was also reviewed. The risks                            and benefits of the procedure and the sedation                            options and risks were discussed with the patient.                            All questions were answered, and informed consent                            was obtained. Prior Anticoagulants: The patient has                            taken no previous anticoagulant or antiplatelet                            agents. ASA Grade Assessment: II - A patient with                            mild systemic disease. After reviewing the risks  and benefits, the patient was deemed in                            satisfactory condition to undergo the procedure.                           - Sedation was administered by an anesthesia                            professional. Deep sedation was attained.                           After obtaining informed consent, the endoscope was                            passed under  direct vision. Throughout the                            procedure, the patient's blood pressure, pulse, and                            oxygen saturations were monitored continuously. The                            PCF-H190DL ([id number]) Olympus pediatric colonscope                            was introduced through the mouth and advanced to                            the small bowel distal to the Ligament of Treitz.                            The small bowel enteroscopy was accomplished                            without difficulty. The patient tolerated the                            procedure well. Scope In: Scope Out: Findings:      The esophagus was normal.      The stomach was normal.      A single angiodysplastic lesion with no bleeding was found in the second       portion of the duodenum. Coagulation for tissue destruction using       monopolar probe was successful.      Six angiodysplastic lesions with no bleeding were found in the proximal       jejunum and in the mid-jejunum. Coagulation for tissue destruction using       monopolar probe was successful.      Application of the APC to one AVM in the mid jejunum did induce       bleeding, which was arrested with several more applications of the APC. Impression:               - Normal esophagus.                           -  Normal stomach.                           - A single non-bleeding angiodysplastic lesion in                            the duodenum. Treated with a monopolar probe.                           - Six non-bleeding angiodysplastic lesions in the                            jejunum. Treated with a monopolar probe.                           - No specimens collected. Recommendation:           - Patient has a contact number available for                            emergencies. The signs and symptoms of potential                            delayed complications were discussed with the                            patient.  Return to normal activities tomorrow.                            Written discharge instructions were provided to the                            patient.                           - Resume regular diet.                           - Follow up in the office in 3 months to check CBC.                           - Iron supplementation QD. Procedure Code(s):        --- Professional ---                           [phone], Small intestinal endoscopy, enteroscopy                            beyond second portion of duodenum, not including                            ileum; with ablation of tumor(s), polyp(s), or                            other lesion(s) not amenable to removal by hot  biopsy forceps, bipolar cautery or snare technique Diagnosis Code(s):        --- Professional ---                           K55.20, Angiodysplasia of colon without hemorrhage                           K31.819, Angiodysplasia of stomach and duodenum                            without bleeding CPT copyright 2019 American [employer] Association. All rights reserved. The codes documented in this report are preliminary and upon coder review may  be revised to meet current compliance requirements. Carol Ada, MD Carol Ada, MD 10/05/2020 2:21:58 PM This report has been signed electronically. Number of Addenda: 0

## 2020-10-06 ENCOUNTER — Encounter (HOSPITAL_COMMUNITY): Payer: Self-pay | Admitting: Gastroenterology

## 2020-10-06 LAB — SURGICAL PATHOLOGY

## 2020-11-25 ENCOUNTER — Other Ambulatory Visit: Payer: Self-pay | Admitting: Cardiology

## 2020-12-28 DIAGNOSIS — D509 Iron deficiency anemia, unspecified: Secondary | ICD-10-CM | POA: Diagnosis not present

## 2021-01-15 ENCOUNTER — Other Ambulatory Visit: Payer: Self-pay | Admitting: Internal Medicine

## 2021-01-15 DIAGNOSIS — Z1231 Encounter for screening mammogram for malignant neoplasm of breast: Secondary | ICD-10-CM

## 2021-01-30 ENCOUNTER — Ambulatory Visit: Payer: Medicare Other

## 2021-01-30 ENCOUNTER — Other Ambulatory Visit: Payer: Self-pay

## 2021-01-30 DIAGNOSIS — R0989 Other specified symptoms and signs involving the circulatory and respiratory systems: Secondary | ICD-10-CM

## 2021-01-30 DIAGNOSIS — I350 Nonrheumatic aortic (valve) stenosis: Secondary | ICD-10-CM | POA: Diagnosis not present

## 2021-01-30 DIAGNOSIS — I6523 Occlusion and stenosis of bilateral carotid arteries: Secondary | ICD-10-CM

## 2021-02-01 DIAGNOSIS — J449 Chronic obstructive pulmonary disease, unspecified: Secondary | ICD-10-CM | POA: Diagnosis not present

## 2021-02-01 DIAGNOSIS — K219 Gastro-esophageal reflux disease without esophagitis: Secondary | ICD-10-CM | POA: Diagnosis not present

## 2021-02-01 DIAGNOSIS — E1140 Type 2 diabetes mellitus with diabetic neuropathy, unspecified: Secondary | ICD-10-CM | POA: Diagnosis not present

## 2021-02-01 DIAGNOSIS — D500 Iron deficiency anemia secondary to blood loss (chronic): Secondary | ICD-10-CM | POA: Diagnosis not present

## 2021-02-01 DIAGNOSIS — E7800 Pure hypercholesterolemia, unspecified: Secondary | ICD-10-CM | POA: Diagnosis not present

## 2021-02-01 DIAGNOSIS — F17200 Nicotine dependence, unspecified, uncomplicated: Secondary | ICD-10-CM | POA: Diagnosis not present

## 2021-02-01 DIAGNOSIS — I10 Essential (primary) hypertension: Secondary | ICD-10-CM | POA: Diagnosis not present

## 2021-02-01 DIAGNOSIS — E559 Vitamin D deficiency, unspecified: Secondary | ICD-10-CM | POA: Diagnosis not present

## 2021-02-19 ENCOUNTER — Other Ambulatory Visit: Payer: Medicare Other

## 2021-02-25 ENCOUNTER — Other Ambulatory Visit: Payer: Self-pay | Admitting: Cardiology

## 2021-02-25 DIAGNOSIS — I6523 Occlusion and stenosis of bilateral carotid arteries: Secondary | ICD-10-CM

## 2021-03-01 ENCOUNTER — Ambulatory Visit: Payer: Medicare Other | Admitting: Cardiology

## 2021-03-01 ENCOUNTER — Encounter: Payer: Self-pay | Admitting: Cardiology

## 2021-03-01 ENCOUNTER — Other Ambulatory Visit: Payer: Self-pay

## 2021-03-01 VITALS — BP 119/65 | HR 66 | Temp 97.5°F | Resp 16 | Ht 60.0 in | Wt 207.0 lb

## 2021-03-01 DIAGNOSIS — E119 Type 2 diabetes mellitus without complications: Secondary | ICD-10-CM | POA: Diagnosis not present

## 2021-03-01 DIAGNOSIS — I6523 Occlusion and stenosis of bilateral carotid arteries: Secondary | ICD-10-CM

## 2021-03-01 DIAGNOSIS — I10 Essential (primary) hypertension: Secondary | ICD-10-CM

## 2021-03-01 DIAGNOSIS — I2510 Atherosclerotic heart disease of native coronary artery without angina pectoris: Secondary | ICD-10-CM | POA: Diagnosis not present

## 2021-03-01 DIAGNOSIS — F17200 Nicotine dependence, unspecified, uncomplicated: Secondary | ICD-10-CM

## 2021-03-01 DIAGNOSIS — I739 Peripheral vascular disease, unspecified: Secondary | ICD-10-CM | POA: Diagnosis not present

## 2021-03-01 DIAGNOSIS — F17210 Nicotine dependence, cigarettes, uncomplicated: Secondary | ICD-10-CM | POA: Diagnosis not present

## 2021-03-01 DIAGNOSIS — E782 Mixed hyperlipidemia: Secondary | ICD-10-CM | POA: Diagnosis not present

## 2021-03-01 NOTE — Progress Notes (Signed)
[name] Date of Birth: [birth year] MRN: [id number] Primary Care Provider:Osei-Bonsu, Iona Beard, MD Former Cardiology Providers: Jeri Lager, APRN, FNP-C Primary Cardiologist: Rex Kras, DO, [facility] (established care 08/30/2019)  Date: 03/01/21 Last Office Visit: 08/29/2020  Chief Complaint  Patient presents with   Coronary Artery Disease   Results   Follow-up   Carotid artery disesae     HPI  [name] is a 70 y.o.  female who presents to the office with a chief complaint of " 33-monthfollow-up for coronary artery disease and carotid artery disease management and review test results." Patient's past medical history and cardiovascular risk factors include: Established coronary artery disease without angina pectoris, hypertension, hyperlipidemia, active smoking, peripheral artery disease status post PV angiogram with revascularization of the SFA, non-insulin-dependent diabetes, postmenopausal female, advanced age, obesity due to excess calories, history of prior GI bleed.  Patient presents today for 612-monthollow-up for management of hypertension, coronary artery disease.  At last office visit patient was doing well from a cardiovascular standpoint.  However, since last visit patient has gained approximately 14 pounds due to dietary indiscretion and reduced functional capacity secondary to motivation.  Patient denies any chest pain at rest or with effort related activities.  Patient's shortness of breath with effort related activities remains relatively stable.  She had a repeat echocardiogram which notes relatively stable LVEF without any significant change in valvular heart disease.  On physical examination she was noted to have carotid bruits and subsequently underwent carotid duplex and found to have bilateral asymptomatic carotid artery stenosis in the range of 50-69%.  Patient is currently on aspirin and statin therapy.  Patient also has history of  peripheral artery disease denies any claudication.  Unfortunately, she continues to smoke 0.5 packs/day.  ALLERGIES: No Known Allergies  MEDICATION LIST PRIOR TO VISIT: Current Outpatient Medications on File Prior to Visit  Medication Sig Dispense Refill   albuterol (PROVENTIL HFA;VENTOLIN HFA) 108 (90 Base) MCG/ACT inhaler Inhale 1-2 puffs into the lungs every 6 (six) hours as needed for wheezing or shortness of breath.     amLODipine (NORVASC) 10 MG tablet Take 10 mg by mouth daily.      ASPIRIN LOW DOSE 81 MG EC tablet TAKE 1 TABLET(81 MG) BY MOUTH DAILY 30 tablet 0   atorvastatin (LIPITOR) 80 MG tablet Take 80 mg by mouth daily.     gabapentin (NEURONTIN) 600 MG tablet Take 1 tablet (600 mg total) by mouth at bedtime. (Patient taking differently: Take 600 mg by mouth at bedtime as needed (pain).) 30 tablet 11   hydrALAZINE (APRESOLINE) 100 MG tablet TAKE 1 TABLET BY MOUTH THREE TIMES DAILY 270 tablet 1   losartan (COZAAR) 100 MG tablet Take 100 mg by mouth daily.     metFORMIN (GLUCOPHAGE) 500 MG tablet Take 0.5 tablets (250 mg total) by mouth 2 (two) times daily with a meal.     nitroGLYCERIN (NITROSTAT) 0.4 MG SL tablet Place 1 tablet (0.4 mg total) under the tongue every 5 (five) minutes as needed for chest pain. 90 tablet 3   pantoprazole (PROTONIX) 40 MG tablet Take 40 mg by mouth daily.     spironolactone (ALDACTONE) 50 MG tablet Take 50 mg by mouth daily.     No current facility-administered medications on file prior to visit.    PAST MEDICAL HISTORY: Past Medical History:  Diagnosis Date   Anemia 12/26/2015   Anxiety    Arthritis    Blood transfusion without reported diagnosis  Chronic pain    resolved per pt 04/12/20   COPD (chronic obstructive pulmonary disease) (HCC)    Coronary artery disease    Depression    Diabetes mellitus without complication (HCC)    type 2   GERD (gastroesophageal reflux disease)    Heart murmur    since birth; Echo 02/18/19: LVEF 68%,  grade 1 diastolic dysfunction, mild AS (mean grad 12 mmHg), trace MR/PR, mild TR, PASP 32 mmHg     Hyperlipemia    Hypertension    PVD (peripheral vascular disease) ([place])    right SFA stent 02/11/17 by Dr. Einar Gip   Sleep apnea    does not use cpap   Wears glasses     PAST SURGICAL HISTORY: Past Surgical History:  Procedure Laterality Date   ABDOMINAL HYSTERECTOMY     CARDIAC CATHETERIZATION     CATARACT EXTRACTION     CESAREAN SECTION     COLONOSCOPY N/A 01/08/2013   Procedure: COLONOSCOPY;  Surgeon: Beryle Beams, MD;  Location: [facility];  Service: Endoscopy;  Laterality: N/A;   COLONOSCOPY N/A 12/28/2015   Procedure: COLONOSCOPY;  Surgeon: Carol Ada, MD;  Location: [place];  Service: Endoscopy;  Laterality: N/A;   COLONOSCOPY WITH PROPOFOL N/A 10/05/2020   Procedure: COLONOSCOPY WITH PROPOFOL;  Surgeon: Carol Ada, MD;  Location: [facility];  Service: Endoscopy;  Laterality: N/A;   ENTEROSCOPY N/A 12/28/2015   Procedure: ENTEROSCOPY;  Surgeon: Carol Ada, MD;  Location: [place];  Service: Endoscopy;  Laterality: N/A;   ENTEROSCOPY N/A 02/20/2018   Procedure: ENTEROSCOPY;  Surgeon: Carol Ada, MD;  Location: [facility];  Service: Endoscopy;  Laterality: N/A;   ENTEROSCOPY N/A 03/27/2018   Procedure: ENTEROSCOPY;  Surgeon: Carol Ada, MD;  [facility]: [facility];  Service: Endoscopy;  Laterality: N/A;   ENTEROSCOPY N/A 10/05/2020   Procedure: ENTEROSCOPY;  Surgeon: Carol Ada, MD;  [facility]: [facility];  Service: Endoscopy;  Laterality: N/A;   ESOPHAGOGASTRODUODENOSCOPY N/A 01/08/2013   Procedure: ESOPHAGOGASTRODUODENOSCOPY (EGD);  Surgeon: Beryle Beams, MD;  Location: [facility];  Service: Endoscopy;  Laterality: N/A;   EYE SURGERY     HAND SURGERY     HOT HEMOSTASIS N/A 12/28/2015   Procedure: HOT HEMOSTASIS (ARGON PLASMA COAGULATION/BICAP);  Surgeon: Carol Ada, MD;  Location: [facility] ENDOSCOPY;  Service: Endoscopy;  Laterality: N/A;   HOT HEMOSTASIS  N/A 02/20/2018   Procedure: HOT HEMOSTASIS (ARGON PLASMA COAGULATION/BICAP);  Surgeon: Carol Ada, MD;  Location: [facility];  Service: Endoscopy;  Laterality: N/A;   HOT HEMOSTASIS N/A 03/27/2018   Procedure: HOT HEMOSTASIS (ARGON PLASMA COAGULATION/BICAP);  Surgeon: Carol Ada, MD;  Location: [facility];  Service: Endoscopy;  Laterality: N/A;   HOT HEMOSTASIS N/A 10/05/2020   Procedure: HOT HEMOSTASIS (ARGON PLASMA COAGULATION/BICAP);  Surgeon: Carol Ada, MD;  Location: [facility];  Service: Endoscopy;  Laterality: N/A;   LEFT HEART CATHETERIZATION WITH CORONARY ANGIOGRAM N/A 03/09/2013   Procedure: LEFT HEART CATHETERIZATION WITH CORONARY ANGIOGRAM;  Surgeon: Laverda Page, MD;  Location: [facility] C[facility] LAB;  Service: Cardiovascular;  Laterality: N/A;   LOWER EXTREMITY ANGIOGRAM N/A 12/29/2012   Procedure: LOWER EXTREMITY ANGIOGRAM;  Surgeon: Laverda Page, MD;  Location: [facility] CATH LAB;  Service: Cardiovascular;  Laterality: N/A;   LOWER EXTREMITY ANGIOGRAM N/A 10/05/2013   Procedure: LOWER EXTREMITY ANGIOGRAM;  Surgeon: Laverda Page, MD;  Location: [facility] CATH LAB;  Service: Cardiovascular;  Laterality: N/A;   LOWER EXTREMITY ANGIOGRAPHY N/A 02/11/2017   Procedure: Lower Extremity Angiography;  Surgeon: Adrian Prows, MD;  Location: [place] CV LAB;  Service: Cardiovascular;  Laterality: N/A;   PERIPHERAL VASCULAR BALLOON ANGIOPLASTY  02/11/2017   Procedure: PERIPHERAL VASCULAR BALLOON ANGIOPLASTY;  Surgeon: Adrian Prows, MD;  Location: [place] CV LAB;  Service: Cardiovascular;;  Right SFA   PERIPHERAL VASCULAR INTERVENTION Right 02/11/2017   Procedure: PERIPHERAL VASCULAR INTERVENTION;  Surgeon: Adrian Prows, MD;  Location: [place] CV LAB;  Service: Cardiovascular;  Laterality: Right;  Rt SFA   POLYPECTOMY  10/05/2020   Procedure: POLYPECTOMY;  Surgeon: Carol Ada, MD;  Location: [facility];  Service: Endoscopy;;   stent in right leg  12/29/2012   for blood clot, Dr. Nadyne Coombes    TOTAL KNEE ARTHROPLASTY Left 04/18/2020   TOTAL KNEE ARTHROPLASTY Left 04/18/2020   Procedure: LEFT TOTAL KNEE ARTHROPLASTY;  Surgeon: Meredith Pel, MD;  Location: [place];  Service: Orthopedics;  Laterality: Left;   TYMPANOMASTOIDECTOMY Left 03/08/2014   Procedure: TYMPANOMASTOIDECTOMY LEFT;  Surgeon: Ascencion Dike, MD;  Location: [place];  Service: ENT;  Laterality: Left;   TYMPANOMASTOIDECTOMY  2015   UTERINE FIBROID SURGERY      FAMILY HISTORY: The patient's family history includes Diabetes in her brother, brother, and mother; Heart disease in her father and mother; Hypertension in her brother, brother, brother, brother, brother, father, mother, sister, and sister.   SOCIAL HISTORY:  The patient  reports that she has been smoking cigarettes. She has a 20.50 pack-year smoking history. She has never used smokeless tobacco. She reports current alcohol use. She reports current drug use. Drugs: Marijuana and Cocaine.  Review of Systems  Constitutional: Positive for weight gain. Negative for chills, fever and weight loss.  HENT:  Negative for hoarse voice and nosebleeds.   Eyes:  Negative for discharge, double vision and pain.  Cardiovascular:  Positive for dyspnea on exertion (chronic). Negative for chest pain, claudication, leg swelling, near-syncope, orthopnea, palpitations, paroxysmal nocturnal dyspnea and syncope.  Respiratory:  Negative for hemoptysis and shortness of breath.   Musculoskeletal:  Positive for arthritis and joint pain. Negative for muscle cramps and myalgias.  Gastrointestinal:  Negative for abdominal pain, constipation, diarrhea, hematemesis, hematochezia, melena, nausea and vomiting.  Neurological:  Positive for dizziness and light-headedness.   PHYSICAL EXAM: Vitals with BMI 03/01/2021 10/05/2020 10/05/2020  Height 5' 0" - -  Weight 207 lbs - -  BMI 32.67 - -  Systolic 124 580 998  Diastolic 65 66 55  Pulse 66 57 59   Orthostatic VS for the past  72 hrs (Last 3 readings):  Orthostatic BP Patient Position BP Location Cuff Size Orthostatic Pulse  03/01/21 1021 112/59 Standing Left Arm Large 58  03/01/21 1020 111/55 Sitting Left Arm Large 56  03/01/21 1019 128/59 Supine Left Arm Large 52   CONSTITUTIONAL: Well-developed and well-nourished. No acute distress.  SKIN: Skin is warm and dry. No rash noted. No cyanosis. No pallor. No jaundice HEAD: Normocephalic and atraumatic.  EYES: No scleral icterus MOUTH/THROAT: Moist oral membranes.  NECK: No JVD present. No thyromegaly noted.  Left carotid bruits  LYMPHATIC: No visible cervical adenopathy.  CHEST Normal respiratory effort. No intercostal retractions  LUNGS: Clear to auscultation bilaterally.  No stridor. No wheezes. No rales.  CARDIOVASCULAR: Regular, positive P3-A2, soft systolic ejection murmur, no gallops or rubs. ABDOMINAL: Obese, soft, nontender, nondistended, positive bowel sounds in all 4 quadrants, no apparent ascites.  EXTREMITIES: No peripheral edema, warm to touch bilaterally.  Diminished DP/PT pulses bilaterally.   HEMATOLOGIC: No significant bruising NEUROLOGIC: Oriented to  person, place, and time. Nonfocal. Normal muscle tone.  PSYCHIATRIC: Normal mood and affect. Normal behavior. Cooperative  CARDIAC DATABASE: EKG: 03/01/2021: Sinus bradycardia, 76 bpm, normal axis, without underlying ischemia or injury pattern.   Echocardiogram: 01/30/2021: Normal LV systolic function with visual EF 55-60%. Left ventricle cavity is normal in size. Mild left ventricular hypertrophy. Normal global wall motion. Normal diastolic filling pattern, normal LAP.  Mild aortic valve stenosis (Peak velocity 2.57ms, PG 267mg, MG 12.65m165m, AVA 1.4cm2).  Mild (Grade I) mitral regurgitation. Mild tricuspid regurgitation. No evidence of pulmonary hypertension. Mild pulmonic regurgitation. Compared to study 02/18/2019: AS remains stable otherwise no significant change.    Stress Testing:   Exercise myoview stress 05/19/2017: 1. Patient achieved 4.4 METS and reached 83% target heart rate. Exercise capacity low for age. Appropriate heart rate and blood pressure response. Stress symptoms included dyspnea, dizziness. Submaximal exercise stress study. 2. No stress EKG changes suggestive of ischemia. 3. Stress and rest SPECT images demonstrate homogeneous tracer distribution throughout the myocardium. Gated SPECT imaging reveals normal myocardial thickening and wall motion. The left ventricular ejection fraction was normal (65%).   4. This is a low risk submaximal exercise stress study  Heart Catheterization: Coronary angiogram 02/27/2014: Mid RCA 60-70% stenosis, mid LAD 60-70% stenosis. FFR to the LAD, lesion insignificant. Medical therapy for CAD. Moderate aortic valve calcification.  Carotid artery duplex 01/30/2021:  Duplex suggests stenosis in the right internal carotid artery (50-69%).  Duplex suggests stenosis in the right external carotid artery (<50%).  Duplex suggests stenosis in the left internal carotid artery (50-69%).  Duplex suggests stenosis in the left external carotid artery (<50%).  Antegrade right vertebral artery flow. Antegrade left vertebral artery flow.  Follow up in six months is appropriate if clinically indicated.   Peripheral arteriogram 02/11/2017: Successful PTA with DCB 5x150x2 InPact Admiral in the right proximal to distal SFA and stenting of proximal SFA with DES, [place] 6x100 mm stent. 3 vessel r/o. Left mild disease and 2 vessel R/O. Mild disease left by angiogram 10/05/2013.   Lower extremity arterial duplex 04/15/2018: No hemodynamically significant stenoses are identified in the lower extremity arterial system. Mild diffuse calcific plaque noted. This exam reveals mildly decreased perfusion of the right lower extremity, noted at the anterior tibial artery level (ABI 0.86) and normal perfusion of the left lower extremity (ABI 0.97). No signficant  change since 05/28/2017. Study suggests patent right SFA angioplasty site  LABORATORY DATA: CBC Latest Ref Rng & Units 04/12/2020 04/05/2020 07/04/2019  WBC 4.0 - 10.5 K/uL 7.7 7.6 7.8  Hemoglobin 12.0 - 15.0 g/dL 12.2 11.3(L) 9.5(L)  Hematocrit 36.0 - 46.0 % 41.0 36.4 34.3(L)  Platelets 150 - 400 K/uL 527(H) 460(H) 542(H)    CMP Latest Ref Rng & Units 04/12/2020 04/05/2020 07/23/2019  Glucose 70 - 99 mg/dL 118(H) 94 91  BUN 8 - 23 mg/dL _0 Creatinine 0.44 - 1.00 mg/dL 0.73 0.75 0.91  Sodium 135 - 145 mmol/L 140 140 142  Potassium 3.5 - 5.1 mmol/L 3.8 3.9 4.3  Chloride 98 - 111 mmol/L 105 108 100  CO2 22 - 32 mmol/L _1 Calcium 8.9 - 10.3 mg/dL 9.7 9.0 10.3  Total Protein 6.5 - 8.1 g/dL - 7.3 -  Total Bilirubin 0.3 - 1.2 mg/dL - 0.4 -  Alkaline Phos 38 - 126 U/L - 69 -  AST 15 - 41 U/L - 14(L) -  ALT 0 - 44 U/L - 9 -    Lipid Panel  Component Value Date/Time   CHOL 166 05/12/2012 0237   TRIG 196 (H) 05/12/2012 0237   HDL 37 (L) 05/12/2012 0237   CHOLHDL 4.5 05/12/2012 0237   VLDL 39 05/12/2012 0237   LDLCALC 90 05/12/2012 0237    Lab Results  Component Value Date   HGBA1C 6.3 (H) 04/12/2020   HGBA1C 5.6 12/26/2015   HGBA1C 6.7 (H) 01/10/2011   No components found for: NTPROBNP Lab Results  Component Value Date   TSH 1.528 05/11/2012   TSH 2.688 01/10/2011   TSH 1.637 12/18/2007    Cardiac Panel (last 3 results) No results for input(s): CKTOTAL, CKMB, TROPONINIHS, RELINDX in the last 72 hours.  IMPRESSION:    ICD-10-CM   1. Carotid artery stenosis, asymptomatic, bilateral  I65.23 Lipid Panel With LDL/HDL Ratio    LDL cholesterol, direct    CMP14+EGFR    2. Atherosclerosis of native coronary artery of native heart without angina pectoris  I25.10 EKG 12-Lead    3. Essential hypertension  I10     4. Non-insulin dependent type 2 diabetes mellitus ([place])  E11.9     5. PAD (peripheral artery disease) (HCC)  I73.9     6. Tobacco use disorder   F17.200     7. Mixed hyperlipidemia  E78.2 Lipid Panel With LDL/HDL Ratio    LDL cholesterol, direct    CMP14+EGFR       RECOMMENDATIONS: Libby Goehring is a 70 y.o. female whose past medical history and cardiovascular risk factors include: Established coronary artery disease without angina pectoris, hypertension, hyperlipidemia, active smoking, peripheral artery disease status post PV angiogram with revascularization of the SFA, non-insulin-dependent diabetes, postmenopausal female, advanced age, obesity due to excess calories, history of prior GI bleed.  Carotid artery stenosis, asymptomatic, bilateral Continue aspirin and statin therapy. Recheck fasting lipid profile to see if statin therapy needs to be further uptitrated. Educated on the importance of complete smoking cessation. Recheck carotid duplex in March 2023.  Atherosclerosis of native coronary artery of native heart without angina pectoris Chest pain-free. No use of sublingual nitroglycerin tablet since last office encounter. EKG is nonischemic. Repeat echocardiogram notes preserved LVEF without any significant valvular heart disease. Educated on the importance of risk factor modifications for secondary prevention.  Essential hypertension Office blood pressures within excellent control. Medications reconciled. Educated on importance of low-salt diet.  Non-insulin dependent type 2 diabetes mellitus ([place]) Educated on the importance of glycemic control given her history of CAD, PAD, carotid artery disease. Currently managed by primary care provider.  PAD (peripheral artery disease) ([place]) Denies claudication. Discussed undergoing lower extremity duplex to reevaluate disease progression. However, she would like to hold off on additional ultrasound at this time and focus on carotid artery disease management and improving her risk factors.  We will follow closely.  Tobacco use disorder Tobacco cessation  counseling: Currently smoking 0.5 packs/day   Patient was informed of the dangers of tobacco abuse including stroke, cancer, and MI, as well as benefits of tobacco cessation. Patient is not willing to quit at this time. 7 mins were spent counseling patient cessation techniques. We discussed various methods to help quit smoking, including deciding on a date to quit, joining a support group, pharmacological agents- nicotine gum/patch/lozenges, chantix.  I will reassess her progress at the next follow-up visit   FINAL MEDICATION LIST END OF ENCOUNTER: No orders of the defined types were placed in this encounter.    Current Outpatient Medications:    albuterol (PROVENTIL HFA;VENTOLIN HFA) 108 (  90 Base) MCG/ACT inhaler, Inhale 1-2 puffs into the lungs every 6 (six) hours as needed for wheezing or shortness of breath., Disp: , Rfl:    amLODipine (NORVASC) 10 MG tablet, Take 10 mg by mouth daily. , Disp: , Rfl:    ASPIRIN LOW DOSE 81 MG EC tablet, TAKE 1 TABLET(81 MG) BY MOUTH DAILY, Disp: 30 tablet, Rfl: 0   atorvastatin (LIPITOR) 80 MG tablet, Take 80 mg by mouth daily., Disp: , Rfl:    gabapentin (NEURONTIN) 600 MG tablet, Take 1 tablet (600 mg total) by mouth at bedtime. (Patient taking differently: Take 600 mg by mouth at bedtime as needed (pain).), Disp: 30 tablet, Rfl: 11   hydrALAZINE (APRESOLINE) 100 MG tablet, TAKE 1 TABLET BY MOUTH THREE TIMES DAILY, Disp: 270 tablet, Rfl: 1   losartan (COZAAR) 100 MG tablet, Take 100 mg by mouth daily., Disp: , Rfl:    metFORMIN (GLUCOPHAGE) 500 MG tablet, Take 0.5 tablets (250 mg total) by mouth 2 (two) times daily with a meal., Disp: , Rfl:    nitroGLYCERIN (NITROSTAT) 0.4 MG SL tablet, Place 1 tablet (0.4 mg total) under the tongue every 5 (five) minutes as needed for chest pain., Disp: 90 tablet, Rfl: 3   pantoprazole (PROTONIX) 40 MG tablet, Take 40 mg by mouth daily., Disp: , Rfl:    spironolactone (ALDACTONE) 50 MG tablet, Take 50 mg by mouth  daily., Disp: , Rfl:   Orders Placed This Encounter  Procedures   Lipid Panel With LDL/HDL Ratio   LDL cholesterol, direct   CMP14+EGFR   EKG 12-Lead    --Continue cardiac medications as reconciled in final medication list. --Return in about 7 months (around 09/29/2021) for Follow up carotid artery disease. . Or sooner if needed. --Continue follow-up with your primary care physician regarding the management of your other chronic comorbid conditions.  Patient's questions and concerns were addressed to her satisfaction. She voices understanding of the instructions provided during this encounter.   This note was created using a voice recognition software as a result there may be grammatical errors inadvertently enclosed that do not reflect the nature of this encounter. Every attempt is made to correct such errors.  Total time spent: 35 minutes.  Rex Kras, [place], [facility]  Pager: [phone] Office: [phone]

## 2021-03-06 ENCOUNTER — Ambulatory Visit
Admission: RE | Admit: 2021-03-06 | Discharge: 2021-03-06 | Disposition: A | Discharge status: Home / Self Care | Payer: Medicare Other | Source: Ambulatory Visit | Attending: Internal Medicine | Admitting: Internal Medicine

## 2021-03-06 ENCOUNTER — Other Ambulatory Visit: Payer: Self-pay

## 2021-03-06 DIAGNOSIS — Z1231 Encounter for screening mammogram for malignant neoplasm of breast: Secondary | ICD-10-CM

## 2021-03-09 ENCOUNTER — Other Ambulatory Visit: Payer: Self-pay | Admitting: Cardiology

## 2021-03-26 DIAGNOSIS — I6523 Occlusion and stenosis of bilateral carotid arteries: Secondary | ICD-10-CM | POA: Diagnosis not present

## 2021-03-26 DIAGNOSIS — E782 Mixed hyperlipidemia: Secondary | ICD-10-CM | POA: Diagnosis not present

## 2021-03-27 LAB — LIPID PANEL WITH LDL/HDL RATIO
Cholesterol, Total: 138 mg/dL (ref 100–199)
HDL: 54 mg/dL (ref >39)
LDL Chol Calc (NIH): 69 mg/dL (ref 0–99)
LDL/HDL Ratio: 1.3 ratio (ref 0.0–3.2)
Triglycerides: 78 mg/dL (ref 0–149)
VLDL Cholesterol Cal: 15 mg/dL (ref 5–40)

## 2021-03-27 LAB — CMP14+EGFR
ALT: 9 IU/L (ref 0–32)
AST: 15 IU/L (ref 0–40)
Albumin/Globulin Ratio: 1.5 (ref 1.2–2.2)
Albumin: 4.5 g/dL (ref 3.8–4.8)
Alkaline Phosphatase: 95 IU/L (ref 44–121)
BUN/Creatinine Ratio: 15 (ref 12–28)
BUN: 12 mg/dL (ref 8–27)
Bilirubin Total: 0.3 mg/dL (ref 0.0–1.2)
CO2: 21 mmol/L (ref 20–29)
Calcium: 10.2 mg/dL (ref 8.7–10.3)
Chloride: 100 mmol/L (ref 96–106)
Creatinine, Ser: 0.78 mg/dL (ref 0.57–1.00)
Globulin, Total: 3 g/dL (ref 1.5–4.5)
Glucose: 95 mg/dL (ref 70–99)
Potassium: 4.2 mmol/L (ref 3.5–5.2)
Sodium: 138 mmol/L (ref 134–144)
Total Protein: 7.5 g/dL (ref 6.0–8.5)
eGFR: 82 mL/min/{1.73_m2} (ref >59)

## 2021-03-27 LAB — LDL CHOLESTEROL, DIRECT: LDL Direct: 71 mg/dL (ref 0–99)

## 2021-04-04 DIAGNOSIS — J449 Chronic obstructive pulmonary disease, unspecified: Secondary | ICD-10-CM | POA: Diagnosis not present

## 2021-04-04 DIAGNOSIS — F17200 Nicotine dependence, unspecified, uncomplicated: Secondary | ICD-10-CM | POA: Diagnosis not present

## 2021-04-04 DIAGNOSIS — E1140 Type 2 diabetes mellitus with diabetic neuropathy, unspecified: Secondary | ICD-10-CM | POA: Diagnosis not present

## 2021-04-04 DIAGNOSIS — I10 Essential (primary) hypertension: Secondary | ICD-10-CM | POA: Diagnosis not present

## 2021-04-04 DIAGNOSIS — Z23 Encounter for immunization: Secondary | ICD-10-CM | POA: Diagnosis not present

## 2021-04-04 DIAGNOSIS — E559 Vitamin D deficiency, unspecified: Secondary | ICD-10-CM | POA: Diagnosis not present

## 2021-04-04 DIAGNOSIS — E7800 Pure hypercholesterolemia, unspecified: Secondary | ICD-10-CM | POA: Diagnosis not present

## 2021-04-04 DIAGNOSIS — D500 Iron deficiency anemia secondary to blood loss (chronic): Secondary | ICD-10-CM | POA: Diagnosis not present

## 2021-04-04 DIAGNOSIS — K219 Gastro-esophageal reflux disease without esophagitis: Secondary | ICD-10-CM | POA: Diagnosis not present

## 2021-04-15 ENCOUNTER — Other Ambulatory Visit: Payer: Self-pay

## 2021-04-15 ENCOUNTER — Encounter (HOSPITAL_BASED_OUTPATIENT_CLINIC_OR_DEPARTMENT_OTHER): Payer: Self-pay | Admitting: Emergency Medicine

## 2021-04-15 ENCOUNTER — Emergency Department (HOSPITAL_BASED_OUTPATIENT_CLINIC_OR_DEPARTMENT_OTHER): Payer: Medicare Other

## 2021-04-15 ENCOUNTER — Emergency Department (HOSPITAL_BASED_OUTPATIENT_CLINIC_OR_DEPARTMENT_OTHER)
Admission: EM | Admit: 2021-04-15 | Discharge: 2021-04-15 | Disposition: A | Discharge status: Home / Self Care | Payer: Medicare Other | Attending: Emergency Medicine | Admitting: Emergency Medicine

## 2021-04-15 DIAGNOSIS — F17210 Nicotine dependence, cigarettes, uncomplicated: Secondary | ICD-10-CM | POA: Insufficient documentation

## 2021-04-15 DIAGNOSIS — E119 Type 2 diabetes mellitus without complications: Secondary | ICD-10-CM | POA: Insufficient documentation

## 2021-04-15 DIAGNOSIS — I517 Cardiomegaly: Secondary | ICD-10-CM | POA: Diagnosis not present

## 2021-04-15 DIAGNOSIS — J449 Chronic obstructive pulmonary disease, unspecified: Secondary | ICD-10-CM | POA: Diagnosis not present

## 2021-04-15 DIAGNOSIS — J189 Pneumonia, unspecified organism: Secondary | ICD-10-CM | POA: Diagnosis not present

## 2021-04-15 DIAGNOSIS — Z7982 Long term (current) use of aspirin: Secondary | ICD-10-CM | POA: Insufficient documentation

## 2021-04-15 DIAGNOSIS — J441 Chronic obstructive pulmonary disease with (acute) exacerbation: Secondary | ICD-10-CM

## 2021-04-15 DIAGNOSIS — J439 Emphysema, unspecified: Secondary | ICD-10-CM | POA: Diagnosis not present

## 2021-04-15 DIAGNOSIS — I10 Essential (primary) hypertension: Secondary | ICD-10-CM | POA: Diagnosis not present

## 2021-04-15 DIAGNOSIS — Z7984 Long term (current) use of oral hypoglycemic drugs: Secondary | ICD-10-CM | POA: Diagnosis not present

## 2021-04-15 DIAGNOSIS — I2510 Atherosclerotic heart disease of native coronary artery without angina pectoris: Secondary | ICD-10-CM | POA: Diagnosis not present

## 2021-04-15 DIAGNOSIS — Z20822 Contact with and (suspected) exposure to covid-19: Secondary | ICD-10-CM | POA: Insufficient documentation

## 2021-04-15 DIAGNOSIS — R0602 Shortness of breath: Secondary | ICD-10-CM | POA: Diagnosis not present

## 2021-04-15 DIAGNOSIS — Z743 Need for continuous supervision: Secondary | ICD-10-CM | POA: Diagnosis not present

## 2021-04-15 DIAGNOSIS — R0682 Tachypnea, not elsewhere classified: Secondary | ICD-10-CM | POA: Diagnosis not present

## 2021-04-15 DIAGNOSIS — R6889 Other general symptoms and signs: Secondary | ICD-10-CM | POA: Diagnosis not present

## 2021-04-15 LAB — COMPREHENSIVE METABOLIC PANEL
ALT: 9 U/L (ref 0–44)
AST: 17 U/L (ref 15–41)
Albumin: 3.7 g/dL (ref 3.5–5.0)
Alkaline Phosphatase: 67 U/L (ref 38–126)
Anion gap: 4 — ABNORMAL LOW (ref 5–15)
BUN: 9 mg/dL (ref 8–23)
CO2: 26 mmol/L (ref 22–32)
Calcium: 9.1 mg/dL (ref 8.9–10.3)
Chloride: 105 mmol/L (ref 98–111)
Creatinine, Ser: 0.74 mg/dL (ref 0.44–1.00)
GFR, Estimated: >60 mL/min (ref >60)
Glucose, Bld: 101 mg/dL — ABNORMAL HIGH (ref 70–99)
Potassium: 3.6 mmol/L (ref 3.5–5.1)
Sodium: 135 mmol/L (ref 135–145)
Total Bilirubin: 0.4 mg/dL (ref 0.3–1.2)
Total Protein: 7.4 g/dL (ref 6.5–8.1)

## 2021-04-15 LAB — CBC WITH DIFFERENTIAL/PLATELET
Abs Immature Granulocytes: 0.03 10*3/uL (ref 0.00–0.07)
Basophils Absolute: 0.1 10*3/uL (ref 0.0–0.1)
Basophils Relative: 1 %
Eosinophils Absolute: 0.2 10*3/uL (ref 0.0–0.5)
Eosinophils Relative: 2 %
HCT: 33 % — ABNORMAL LOW (ref 36.0–46.0)
Hemoglobin: 9.4 g/dL — ABNORMAL LOW (ref 12.0–15.0)
Immature Granulocytes: 0 %
Lymphocytes Relative: 14 %
Lymphs Abs: 1.3 10*3/uL (ref 0.7–4.0)
MCH: 20.2 pg — ABNORMAL LOW (ref 26.0–34.0)
MCHC: 28.5 g/dL — ABNORMAL LOW (ref 30.0–36.0)
MCV: 70.8 fL — ABNORMAL LOW (ref 80.0–100.0)
Monocytes Absolute: 0.8 10*3/uL (ref 0.1–1.0)
Monocytes Relative: 9 %
Neutro Abs: 7 10*3/uL (ref 1.7–7.7)
Neutrophils Relative %: 74 %
Platelets: 416 10*3/uL — ABNORMAL HIGH (ref 150–400)
RBC: 4.66 MIL/uL (ref 3.87–5.11)
RDW: 18.6 % — ABNORMAL HIGH (ref 11.5–15.5)
WBC: 9.4 10*3/uL (ref 4.0–10.5)
nRBC: 0 % (ref 0.0–0.2)

## 2021-04-15 LAB — RESP PANEL BY RT-PCR (FLU A&B, COVID) ARPGX2
Influenza A by PCR: NEGATIVE
Influenza B by PCR: NEGATIVE
SARS Coronavirus 2 by RT PCR: NEGATIVE

## 2021-04-15 LAB — TROPONIN I (HIGH SENSITIVITY): Troponin I (High Sensitivity): 10 ng/L (ref <18)

## 2021-04-15 LAB — BRAIN NATRIURETIC PEPTIDE: B Natriuretic Peptide: 65.4 pg/mL (ref 0.0–100.0)

## 2021-04-15 MED ORDER — DOXYCYCLINE HYCLATE 100 MG PO TABS
100.0000 mg | ORAL_TABLET | Freq: Once | ORAL | Status: AC
  Administered 2021-04-15: 100 mg via ORAL
  Filled 2021-04-15: qty 1

## 2021-04-15 MED ORDER — DOXYCYCLINE HYCLATE 100 MG PO CAPS: 100.0000 mg | ORAL_CAPSULE | Freq: Two times a day (BID) | ORAL | 0 refills | Status: AC

## 2021-04-15 MED ORDER — IPRATROPIUM BROMIDE HFA 17 MCG/ACT IN AERS
2.0000 | INHALATION_SPRAY | Freq: Once | RESPIRATORY_TRACT | Status: AC
  Administered 2021-04-15: 2 via RESPIRATORY_TRACT
  Filled 2021-04-15: qty 12.9

## 2021-04-15 MED ORDER — PREDNISONE 50 MG PO TABS
60.0000 mg | ORAL_TABLET | Freq: Once | ORAL | Status: AC
  Administered 2021-04-15: 60 mg via ORAL
  Filled 2021-04-15: qty 1

## 2021-04-15 MED ORDER — PREDNISONE 20 MG PO TABS: 60.0000 mg | ORAL_TABLET | Freq: Every day | ORAL | 0 refills | Status: AC

## 2021-04-15 MED ORDER — ALBUTEROL SULFATE HFA 108 (90 BASE) MCG/ACT IN AERS
4.0000 | INHALATION_SPRAY | Freq: Once | RESPIRATORY_TRACT | Status: AC
  Administered 2021-04-15: 4 via RESPIRATORY_TRACT
  Filled 2021-04-15: qty 6.7

## 2021-04-15 NOTE — ED Triage Notes (Signed)
PT to ED from home with c/o SOB/congestion at home. Pt has hx of COPD and states she is experiencing Dyspnea on exertion. PT has tachypnea in triage.

## 2021-04-15 NOTE — ED Provider Notes (Addendum)
[facility] EMERGENCY DEPT Provider Note   CSN: [id number] Arrival date & time: 04/15/21  6283     History Chief Complaint  Patient presents with   Shortness of Breath    [name] is a 70 y.o. female.  The history is provided by the patient.  Shortness of Breath Severity:  Moderate Onset quality:  Gradual Duration:  1 week Timing:  Constant Progression:  Unchanged Chronicity:  Recurrent Context: URI   Relieved by:  Nothing Worsened by:  Nothing Ineffective treatments:  None tried Associated symptoms: cough and wheezing   Associated symptoms: no fever, no rash and no vomiting   Associated symptoms comment:  Nasal congestion and DOE Risk factors: no oral contraceptive use   Patient with COPD presents with cough, reported wheezing, nasal congestion and dyspnea since getting her flu vaccine over a week ago.  No f/c/r.  No edema.      Past Medical History:  Diagnosis Date   Anemia 12/26/2015   Anxiety    Arthritis    Blood transfusion without reported diagnosis    Chronic pain    resolved per pt 04/12/20   COPD (chronic obstructive pulmonary disease) ([place])    Coronary artery disease    Depression    Diabetes mellitus without complication (HCC)    type 2   GERD (gastroesophageal reflux disease)    Heart murmur    since birth; Echo 02/18/19: LVEF 66%, grade 1 diastolic dysfunction, mild AS (mean grad 12 mmHg), trace MR/PR, mild TR, PASP 32 mmHg     Hyperlipemia    Hypertension    PVD (peripheral vascular disease) ([place])    right SFA stent 02/11/17 by Dr. Einar Gip   Sleep apnea    does not use cpap   Wears glasses     Patient Active Problem List   Diagnosis Date Noted   Osteoarthritis of left knee 04/18/2020   Tobacco use disorder 09/18/2018   Iron deficiency anemia 04/09/2018   Symptomatic anemia 12/26/2015   Hypokalemia 12/26/2015   Type 2 diabetes mellitus with peripheral vascular disease ([place]) 12/26/2015   Claudication in  peripheral vascular disease ([place]) 10/05/2013   Discomfort in chest 05/11/2012   Nicotine dependence 05/11/2012   Bradycardia 05/11/2012   HTN (hypertension) 05/11/2012   Back pain 05/11/2012    Past Surgical History:  Procedure Laterality Date   ABDOMINAL HYSTERECTOMY     CARDIAC CATHETERIZATION     CATARACT EXTRACTION     CESAREAN SECTION     COLONOSCOPY N/A 01/08/2013   Procedure: COLONOSCOPY;  Surgeon: Beryle Beams, MD;  Location: [facility];  Service: Endoscopy;  Laterality: N/A;   COLONOSCOPY N/A 12/28/2015   Procedure: COLONOSCOPY;  Surgeon: Carol Ada, MD;  Location: [place];  Service: Endoscopy;  Laterality: N/A;   COLONOSCOPY WITH PROPOFOL N/A 10/05/2020   Procedure: COLONOSCOPY WITH PROPOFOL;  Surgeon: Carol Ada, MD;  Location: [facility];  Service: Endoscopy;  Laterality: N/A;   ENTEROSCOPY N/A 12/28/2015   Procedure: ENTEROSCOPY;  Surgeon: Carol Ada, MD;  Location: [place];  Service: Endoscopy;  Laterality: N/A;   ENTEROSCOPY N/A 02/20/2018   Procedure: ENTEROSCOPY;  Surgeon: Carol Ada, MD;  Location: [facility];  Service: Endoscopy;  Laterality: N/A;   ENTEROSCOPY N/A 03/27/2018   Procedure: ENTEROSCOPY;  Surgeon: Carol Ada, MD;  Location: [facility];  Service: Endoscopy;  Laterality: N/A;   ENTEROSCOPY N/A 10/05/2020   Procedure: ENTEROSCOPY;  Surgeon: Carol Ada, MD;  Location: [facility];  Service: Endoscopy;  Laterality:  N/A;   ESOPHAGOGASTRODUODENOSCOPY N/A 01/08/2013   Procedure: ESOPHAGOGASTRODUODENOSCOPY (EGD);  Surgeon: Beryle Beams, MD;  Location: [facility];  Service: Endoscopy;  Laterality: N/A;   EYE SURGERY     HAND SURGERY     HOT HEMOSTASIS N/A 12/28/2015   Procedure: HOT HEMOSTASIS (ARGON PLASMA COAGULATION/BICAP);  Surgeon: Carol Ada, MD;  Location: [facility] ENDOSCOPY;  Service: Endoscopy;  Laterality: N/A;   HOT HEMOSTASIS N/A 02/20/2018   Procedure: HOT HEMOSTASIS (ARGON PLASMA COAGULATION/BICAP);  Surgeon: Carol Ada, MD;  Location: [facility];  Service: Endoscopy;  Laterality: N/A;   HOT HEMOSTASIS N/A 03/27/2018   Procedure: HOT HEMOSTASIS (ARGON PLASMA COAGULATION/BICAP);  Surgeon: Carol Ada, MD;  Location: [facility];  Service: Endoscopy;  Laterality: N/A;   HOT HEMOSTASIS N/A 10/05/2020   Procedure: HOT HEMOSTASIS (ARGON PLASMA COAGULATION/BICAP);  Surgeon: Carol Ada, MD;  Location: [facility];  Service: Endoscopy;  Laterality: N/A;   LEFT HEART CATHETERIZATION WITH CORONARY ANGIOGRAM N/A 03/09/2013   Procedure: LEFT HEART CATHETERIZATION WITH CORONARY ANGIOGRAM;  Surgeon: Laverda Page, MD;  Location: [facility] CATH LAB;  Service: Cardiovascular;  Laterality: N/A;   LOWER EXTREMITY ANGIOGRAM N/A 12/29/2012   Procedure: LOWER EXTREMITY ANGIOGRAM;  Surgeon: Laverda Page, MD;  Location: [facility] CATH LAB;  Service: Cardiovascular;  Laterality: N/A;   LOWER EXTREMITY ANGIOGRAM N/A 10/05/2013   Procedure: LOWER EXTREMITY ANGIOGRAM;  Surgeon: Laverda Page, MD;  Location: [facility] CATH LAB;  Service: Cardiovascular;  Laterality: N/A;   LOWER EXTREMITY ANGIOGRAPHY N/A 02/11/2017   Procedure: Lower Extremity Angiography;  Surgeon: Adrian Prows, MD;  Location: [place] CV LAB;  Service: Cardiovascular;  Laterality: N/A;   PERIPHERAL VASCULAR BALLOON ANGIOPLASTY  02/11/2017   Procedure: PERIPHERAL VASCULAR BALLOON ANGIOPLASTY;  Surgeon: Adrian Prows, MD;  Location: [place] [facility] LAB;  Service: Cardiovascular;;  Right SFA   PERIPHERAL VASCULAR INTERVENTION Right 02/11/2017   Procedure: PERIPHERAL VASCULAR INTERVENTION;  Surgeon: Adrian Prows, MD;  Location: [place] CV LAB;  Service: Cardiovascular;  Laterality: Right;  Rt SFA   POLYPECTOMY  10/05/2020   Procedure: POLYPECTOMY;  Surgeon: Carol Ada, MD;  Location: [facility];  Service: Endoscopy;;   stent in right leg  12/29/2012   for blood clot, Dr. Nadyne Coombes   TOTAL KNEE ARTHROPLASTY Left 04/18/2020   TOTAL KNEE ARTHROPLASTY Left 04/18/2020    Procedure: LEFT TOTAL KNEE ARTHROPLASTY;  Surgeon: Meredith Pel, MD;  Location: [place];  Service: Orthopedics;  Laterality: Left;   TYMPANOMASTOIDECTOMY Left 03/08/2014   Procedure: TYMPANOMASTOIDECTOMY LEFT;  Surgeon: Ascencion Dike, MD;  Location: [place];  Service: ENT;  Laterality: Left;   TYMPANOMASTOIDECTOMY  2015   UTERINE FIBROID SURGERY       OB History   No obstetric history on file.     Family History  Problem Relation Age of Onset   Diabetes Mother    Hypertension Mother    Heart disease Mother    Heart disease Father    Hypertension Father    Hypertension Sister    Hypertension Brother    Diabetes Brother    Hypertension Sister    Hypertension Brother    Diabetes Brother    Hypertension Brother    Hypertension Brother    Hypertension Brother     Social History   Tobacco Use   Smoking status: Every Day    Packs/day: 0.50    Years: 41.00    Pack years: 20.50    Types: Cigarettes   Smokeless tobacco: Never  Vaping Use   Vaping Use: Never used  Substance Use Topics   Alcohol use: Yes    Comment: occasional   Drug use: Yes    Types: Marijuana, Cocaine    Comment: Last use 04/11/20    Home Medications Prior to Admission medications   Medication Sig Start Date End Date Taking? Authorizing Provider  albuterol (PROVENTIL HFA;VENTOLIN HFA) 108 (90 Base) MCG/ACT inhaler Inhale 1-2 puffs into the lungs every 6 (six) hours as needed for wheezing or shortness of breath.    [provider]  amLODipine (NORVASC) 10 MG tablet Take 10 mg by mouth daily.  07/06/19   [provider]  ASPIRIN LOW DOSE 81 MG EC tablet TAKE 1 TABLET(81 MG) BY MOUTH DAILY 06/30/20   Magnant, Charles L, PA-C  atorvastatin (LIPITOR) 80 MG tablet Take 80 mg by mouth daily. 08/08/20   [provider]  gabapentin (NEURONTIN) 600 MG tablet Take 1 tablet (600 mg total) by mouth at bedtime. Patient taking differently: Take 600 mg by mouth at bedtime as  needed (pain). 10/02/17   Hyatt, Max T, DPM  hydrALAZINE (APRESOLINE) 100 MG tablet TAKE 1 TABLET BY MOUTH THREE TIMES DAILY 11/27/20   Adrian Prows, MD  losartan (COZAAR) 100 MG tablet Take 100 mg by mouth daily. 08/29/20   [provider]  metFORMIN (GLUCOPHAGE) 500 MG tablet Take 0.5 tablets (250 mg total) by mouth 2 (two) times daily with a meal. 02/13/17   Adrian Prows, MD  nitroGLYCERIN (NITROSTAT) 0.4 MG SL tablet Place 1 tablet (0.4 mg total) under the tongue every 5 (five) minutes as needed for chest pain. 01/13/19 03/01/21  Miquel Dunn, NP  pantoprazole (PROTONIX) 40 MG tablet Take 40 mg by mouth daily. 08/23/20   [provider]  spironolactone (ALDACTONE) 50 MG tablet TAKE 1 TABLET(50 MG) BY MOUTH DAILY 03/09/21   [name], [name], DO    Allergies    Patient has no known allergies.  Review of Systems   Review of Systems  Constitutional:  Negative for fever.  HENT:  Positive for congestion.   Eyes:  Negative for redness.  Respiratory:  Positive for cough, shortness of breath and wheezing.   Cardiovascular:  Negative for leg swelling.  Gastrointestinal:  Negative for vomiting.  Musculoskeletal:  Negative for neck stiffness.  Skin:  Negative for rash.  Neurological:  Negative for facial asymmetry.  All other systems reviewed and are negative.  Physical Exam Updated Vital Signs BP (!) 152/66 (BP Location: Left Arm)   Pulse 71   Temp 98.8 F (37.1 C) (Oral)   Resp (!) 24   Ht 5' (1.524 m)   Wt 95.3 kg   LMP  (LMP Unknown)   SpO2 96%   BMI 41.01 kg/m   Physical Exam Vitals and nursing note reviewed. Exam conducted with a chaperone present.  Constitutional:      Appearance: Normal appearance. She is not diaphoretic.  HENT:     Head: Normocephalic and atraumatic.     Nose: Nose normal.  Eyes:     Conjunctiva/sclera: Conjunctivae normal.     Pupils: Pupils are equal, round, and reactive to light.  Cardiovascular:     Rate and Rhythm: Normal rate and  regular rhythm.     Pulses: Normal pulses.     Heart sounds: Normal heart sounds.  Pulmonary:     Breath sounds: Decreased air movement present. No stridor. No rhonchi or rales.  Abdominal:     General: Bowel sounds are  normal.     Palpations: Abdomen is soft.     Tenderness: There is no abdominal tenderness. There is no guarding.  Musculoskeletal:     Right lower leg: No edema.     Left lower leg: No edema.     Comments: Quick and steady gait to the room   Skin:    General: Skin is warm and dry.     Capillary Refill: Capillary refill takes less than 2 seconds.  Neurological:     General: No focal deficit present.     Mental Status: She is alert.     Deep Tendon Reflexes: Reflexes normal.  Psychiatric:        Mood and Affect: Mood normal.        Behavior: Behavior normal.    ED Results / Procedures / Treatments   Labs (all labs ordered are listed, but only abnormal results are displayed) Results for orders placed or performed during the hospital encounter of 04/15/21  CBC with Differential/Platelet  Result Value Ref Range   WBC 9.4 4.0 - 10.5 K/uL   RBC 4.66 3.87 - 5.11 MIL/uL   Hemoglobin 9.4 (L) 12.0 - 15.0 g/dL   HCT 33.0 (L) 36.0 - 46.0 %   MCV 70.8 (L) 80.0 - 100.0 fL   MCH 20.2 (L) 26.0 - 34.0 pg   MCHC 28.5 (L) 30.0 - 36.0 g/dL   RDW 18.6 (H) 11.5 - 15.5 %   Platelets 416 (H) 150 - 400 K/uL   nRBC 0.0 0.0 - 0.2 %   Neutrophils Relative % 74 %   Neutro Abs 7.0 1.7 - 7.7 K/uL   Lymphocytes Relative 14 %   Lymphs Abs 1.3 0.7 - 4.0 K/uL   Monocytes Relative 9 %   Monocytes Absolute 0.8 0.1 - 1.0 K/uL   Eosinophils Relative 2 %   Eosinophils Absolute 0.2 0.0 - 0.5 K/uL   Basophils Relative 1 %   Basophils Absolute 0.1 0.0 - 0.1 K/uL   Immature Granulocytes 0 %   Abs Immature Granulocytes 0.03 0.00 - 0.07 K/uL   No results found.  EKG  EKG Interpretation  Date/Time:  Sunday April 15 2021 06:38:02 EDT Ventricular Rate:  73 PR Interval:  154 QRS  Duration: 84 QT Interval:  371 QTC Calculation: 409 R Axis:   45 Text Interpretation: Sinus rhythm Atrial premature complex Confirmed by Randal Buba, Nobuo Nunziata ([id number]) on 04/15/2021 6:40:10 AM        Radiology No results found.  Procedures Procedures   Medications Ordered in ED   ED Course  I have reviewed the triage vital signs and the nursing notes.  Pertinent labs & imaging results that were available during my care of the patient were reviewed by me and considered in my medical decision making (see chart for details).   RT has listened to the patient and concurs there is no wheezing at this time   Normal oxygen saturation on room air.   MDM Rules/Calculators/A&P                           [name] was evaluated in Emergency Department on 04/15/2021 for the symptoms described in the history of present illness. She was evaluated in the context of the global COVID-19 pandemic, which necessitated consideration that the patient might be at risk for infection with the SARS-CoV-2 virus that causes COVID-19. Institutional protocols and algorithms that pertain to the evaluation of patients at risk for  COVID-19 are in a state of rapid change based on information released by regulatory bodies including the CDC and federal and state organizations. These policies and algorithms were followed during the patient's care in the ED.  Final Clinical Impression(s) / ED Diagnoses Final diagnoses:  None   Signed out to Dr. Ronnald Nian pending labs, troponins and reassessment     Kayson Bullis, MD 04/15/21 [phone]

## 2021-04-15 NOTE — ED Provider Notes (Signed)
Signed out to me at 7 AM.  Patient comes in with cough and congestion shortness of breath for the last several days.  Got her flu vaccine last week and has felt bad ever since.  Having lots of nasal congestion.  History of COPD.  EKG shows sinus rhythm.  Troponin normal.  BNP within normal limits.  No significant anemia or electrolyte abnormality.  No leukocytosis.  No fever.  Chest x-ray shows may be developing infection.  She does have some mild wheezing on exam and overall suspect a COPD exacerbation with may be pneumonia.  Will treat with steroids and antibiotics.  Could also be a virus.  Viral panel is still pending.  She is outside the window for any treatment given the length of her symptoms.  She was able to ambulate without any issues.  Not hypoxic both at rest and with ambulation.  Understands return precautions and discharged in ED in good condition.  This chart was dictated using voice recognition software.  Despite best efforts to proofread,  errors can occur which can change the documentation meaning.    Lennice Sites, DO 04/15/21 [phone]

## 2021-04-15 NOTE — Discharge Instructions (Addendum)
You have been prescribed antibiotics for pneumonia.  Take your next dose of antibiotic this evening around 7:00 and then daily for the next 7 days.  You have been given your first dose of steroid to help with your COPD.  Take your next dose tomorrow.  Recommend using your albuterol inhaler 4 puffs every 4 hours for the next 24 hours and then as needed.

## 2021-05-09 ENCOUNTER — Encounter (HOSPITAL_BASED_OUTPATIENT_CLINIC_OR_DEPARTMENT_OTHER): Payer: Self-pay

## 2021-05-09 ENCOUNTER — Emergency Department (HOSPITAL_BASED_OUTPATIENT_CLINIC_OR_DEPARTMENT_OTHER): Payer: Medicare Other

## 2021-05-09 ENCOUNTER — Inpatient Hospital Stay (HOSPITAL_BASED_OUTPATIENT_CLINIC_OR_DEPARTMENT_OTHER)
Admission: EM | Admit: 2021-05-09 | Discharge: 2021-05-12 | DRG: 378 | Disposition: A | Discharge status: Home / Self Care | Payer: Medicare Other | Attending: Internal Medicine | Admitting: Internal Medicine

## 2021-05-09 ENCOUNTER — Other Ambulatory Visit: Payer: Self-pay

## 2021-05-09 DIAGNOSIS — K5730 Diverticulosis of large intestine without perforation or abscess without bleeding: Secondary | ICD-10-CM | POA: Diagnosis not present

## 2021-05-09 DIAGNOSIS — Z6841 Body Mass Index (BMI) 40.0 and over, adult: Secondary | ICD-10-CM

## 2021-05-09 DIAGNOSIS — G894 Chronic pain syndrome: Secondary | ICD-10-CM | POA: Diagnosis not present

## 2021-05-09 DIAGNOSIS — I2510 Atherosclerotic heart disease of native coronary artery without angina pectoris: Secondary | ICD-10-CM | POA: Diagnosis present

## 2021-05-09 DIAGNOSIS — D62 Acute posthemorrhagic anemia: Secondary | ICD-10-CM | POA: Diagnosis not present

## 2021-05-09 DIAGNOSIS — Z90710 Acquired absence of both cervix and uterus: Secondary | ICD-10-CM

## 2021-05-09 DIAGNOSIS — D509 Iron deficiency anemia, unspecified: Secondary | ICD-10-CM | POA: Diagnosis present

## 2021-05-09 DIAGNOSIS — Z7984 Long term (current) use of oral hypoglycemic drugs: Secondary | ICD-10-CM

## 2021-05-09 DIAGNOSIS — K31811 Angiodysplasia of stomach and duodenum with bleeding: Secondary | ICD-10-CM | POA: Diagnosis not present

## 2021-05-09 DIAGNOSIS — R0689 Other abnormalities of breathing: Secondary | ICD-10-CM | POA: Diagnosis not present

## 2021-05-09 DIAGNOSIS — E785 Hyperlipidemia, unspecified: Secondary | ICD-10-CM | POA: Diagnosis not present

## 2021-05-09 DIAGNOSIS — D649 Anemia, unspecified: Secondary | ICD-10-CM | POA: Diagnosis not present

## 2021-05-09 DIAGNOSIS — Z79899 Other long term (current) drug therapy: Secondary | ICD-10-CM

## 2021-05-09 DIAGNOSIS — R011 Cardiac murmur, unspecified: Secondary | ICD-10-CM | POA: Diagnosis not present

## 2021-05-09 DIAGNOSIS — Z743 Need for continuous supervision: Secondary | ICD-10-CM | POA: Diagnosis not present

## 2021-05-09 DIAGNOSIS — Z20822 Contact with and (suspected) exposure to covid-19: Secondary | ICD-10-CM | POA: Diagnosis not present

## 2021-05-09 DIAGNOSIS — E1151 Type 2 diabetes mellitus with diabetic peripheral angiopathy without gangrene: Secondary | ICD-10-CM | POA: Diagnosis not present

## 2021-05-09 DIAGNOSIS — K219 Gastro-esophageal reflux disease without esophagitis: Secondary | ICD-10-CM | POA: Diagnosis present

## 2021-05-09 DIAGNOSIS — J449 Chronic obstructive pulmonary disease, unspecified: Secondary | ICD-10-CM | POA: Diagnosis not present

## 2021-05-09 DIAGNOSIS — Z96652 Presence of left artificial knee joint: Secondary | ICD-10-CM | POA: Diagnosis not present

## 2021-05-09 DIAGNOSIS — Z8249 Family history of ischemic heart disease and other diseases of the circulatory system: Secondary | ICD-10-CM | POA: Diagnosis not present

## 2021-05-09 DIAGNOSIS — Z7982 Long term (current) use of aspirin: Secondary | ICD-10-CM | POA: Diagnosis not present

## 2021-05-09 DIAGNOSIS — Z87891 Personal history of nicotine dependence: Secondary | ICD-10-CM

## 2021-05-09 DIAGNOSIS — I10 Essential (primary) hypertension: Secondary | ICD-10-CM | POA: Diagnosis present

## 2021-05-09 DIAGNOSIS — Z7952 Long term (current) use of systemic steroids: Secondary | ICD-10-CM

## 2021-05-09 DIAGNOSIS — J441 Chronic obstructive pulmonary disease with (acute) exacerbation: Secondary | ICD-10-CM | POA: Diagnosis not present

## 2021-05-09 DIAGNOSIS — E119 Type 2 diabetes mellitus without complications: Secondary | ICD-10-CM | POA: Diagnosis present

## 2021-05-09 DIAGNOSIS — E876 Hypokalemia: Secondary | ICD-10-CM | POA: Diagnosis not present

## 2021-05-09 DIAGNOSIS — K922 Gastrointestinal hemorrhage, unspecified: Secondary | ICD-10-CM | POA: Diagnosis present

## 2021-05-09 DIAGNOSIS — M1990 Unspecified osteoarthritis, unspecified site: Secondary | ICD-10-CM | POA: Diagnosis not present

## 2021-05-09 DIAGNOSIS — K921 Melena: Secondary | ICD-10-CM | POA: Diagnosis not present

## 2021-05-09 DIAGNOSIS — R195 Other fecal abnormalities: Secondary | ICD-10-CM

## 2021-05-09 DIAGNOSIS — Z86010 Personal history of colonic polyps: Secondary | ICD-10-CM | POA: Diagnosis not present

## 2021-05-09 DIAGNOSIS — R0600 Dyspnea, unspecified: Secondary | ICD-10-CM | POA: Diagnosis not present

## 2021-05-09 DIAGNOSIS — G4730 Sleep apnea, unspecified: Secondary | ICD-10-CM | POA: Diagnosis present

## 2021-05-09 DIAGNOSIS — Z833 Family history of diabetes mellitus: Secondary | ICD-10-CM

## 2021-05-09 DIAGNOSIS — R6889 Other general symptoms and signs: Secondary | ICD-10-CM | POA: Diagnosis not present

## 2021-05-09 DIAGNOSIS — I517 Cardiomegaly: Secondary | ICD-10-CM | POA: Diagnosis not present

## 2021-05-09 DIAGNOSIS — R0602 Shortness of breath: Secondary | ICD-10-CM | POA: Diagnosis not present

## 2021-05-09 LAB — CBC WITH DIFFERENTIAL/PLATELET
Abs Immature Granulocytes: 0.01 10*3/uL (ref 0.00–0.07)
Basophils Absolute: 0 10*3/uL (ref 0.0–0.1)
Basophils Relative: 1 %
Eosinophils Absolute: 0.2 10*3/uL (ref 0.0–0.5)
Eosinophils Relative: 3 %
HCT: 28.5 % — ABNORMAL LOW (ref 36.0–46.0)
Hemoglobin: 7.9 g/dL — ABNORMAL LOW (ref 12.0–15.0)
Immature Granulocytes: 0 %
Lymphocytes Relative: 19 %
Lymphs Abs: 1.4 10*3/uL (ref 0.7–4.0)
MCH: 19.7 pg — ABNORMAL LOW (ref 26.0–34.0)
MCHC: 27.7 g/dL — ABNORMAL LOW (ref 30.0–36.0)
MCV: 71.1 fL — ABNORMAL LOW (ref 80.0–100.0)
Monocytes Absolute: 0.8 10*3/uL (ref 0.1–1.0)
Monocytes Relative: 11 %
Neutro Abs: 4.8 10*3/uL (ref 1.7–7.7)
Neutrophils Relative %: 66 %
Platelets: 590 10*3/uL — ABNORMAL HIGH (ref 150–400)
RBC: 4.01 MIL/uL (ref 3.87–5.11)
RDW: 19.5 % — ABNORMAL HIGH (ref 11.5–15.5)
WBC: 7.2 10*3/uL (ref 4.0–10.5)
nRBC: 0 % (ref 0.0–0.2)

## 2021-05-09 LAB — TYPE AND SCREEN
ABO/RH(D): O POS
Antibody Screen: NEGATIVE

## 2021-05-09 LAB — IRON AND TIBC
Iron: 12 ug/dL — ABNORMAL LOW (ref 28–170)
Saturation Ratios: 3 % — ABNORMAL LOW (ref 10.4–31.8)
TIBC: 430 ug/dL (ref 250–450)
UIBC: 418 ug/dL

## 2021-05-09 LAB — COMPREHENSIVE METABOLIC PANEL
ALT: 11 U/L (ref 0–44)
AST: 15 U/L (ref 15–41)
Albumin: 3.5 g/dL (ref 3.5–5.0)
Alkaline Phosphatase: 66 U/L (ref 38–126)
Anion gap: 9 (ref 5–15)
BUN: 13 mg/dL (ref 8–23)
CO2: 26 mmol/L (ref 22–32)
Calcium: 9 mg/dL (ref 8.9–10.3)
Chloride: 107 mmol/L (ref 98–111)
Creatinine, Ser: 0.7 mg/dL (ref 0.44–1.00)
GFR, Estimated: >60 mL/min (ref >60)
Glucose, Bld: 114 mg/dL — ABNORMAL HIGH (ref 70–99)
Potassium: 3.8 mmol/L (ref 3.5–5.1)
Sodium: 142 mmol/L (ref 135–145)
Total Bilirubin: 0.4 mg/dL (ref 0.3–1.2)
Total Protein: 6.5 g/dL (ref 6.5–8.1)

## 2021-05-09 LAB — OCCULT BLOOD X 1 CARD TO LAB, STOOL: Fecal Occult Bld: POSITIVE — AB

## 2021-05-09 LAB — HEMOGLOBIN AND HEMATOCRIT, BLOOD
HCT: 30.6 % — ABNORMAL LOW (ref 36.0–46.0)
Hemoglobin: 8.5 g/dL — ABNORMAL LOW (ref 12.0–15.0)

## 2021-05-09 LAB — GLUCOSE, CAPILLARY
Glucose-Capillary: 98 mg/dL (ref 70–99)
Glucose-Capillary: 100 mg/dL — ABNORMAL HIGH (ref 70–99)

## 2021-05-09 LAB — CBG MONITORING, ED: Glucose-Capillary: 110 mg/dL — ABNORMAL HIGH (ref 70–99)

## 2021-05-09 LAB — FOLATE: Folate: 20.6 ng/mL (ref >5.9)

## 2021-05-09 LAB — RETICULOCYTES
Immature Retic Fract: 24 % — ABNORMAL HIGH (ref 2.3–15.9)
RBC.: 3.97 MIL/uL (ref 3.87–5.11)
Retic Count, Absolute: 56.8 10*3/uL (ref 19.0–186.0)
Retic Ct Pct: 1.4 % (ref 0.4–3.1)

## 2021-05-09 LAB — HEMOGLOBIN A1C
Hgb A1c MFr Bld: 5.8 % — ABNORMAL HIGH (ref 4.8–5.6)
Mean Plasma Glucose: 119.76 mg/dL

## 2021-05-09 LAB — VITAMIN B12: Vitamin B-12: 337 pg/mL (ref 180–914)

## 2021-05-09 LAB — FERRITIN: Ferritin: 6 ng/mL — ABNORMAL LOW (ref 11–307)

## 2021-05-09 LAB — RESP PANEL BY RT-PCR (FLU A&B, COVID) ARPGX2
Influenza A by PCR: NEGATIVE
Influenza B by PCR: NEGATIVE
SARS Coronavirus 2 by RT PCR: NEGATIVE

## 2021-05-09 LAB — TROPONIN I (HIGH SENSITIVITY): Troponin I (High Sensitivity): 11 ng/L (ref <18)

## 2021-05-09 LAB — BRAIN NATRIURETIC PEPTIDE: B Natriuretic Peptide: 98.8 pg/mL (ref 0.0–100.0)

## 2021-05-09 MED ORDER — ONDANSETRON HCL 4 MG/2ML IJ SOLN: 4.0000 mg | Freq: Four times a day (QID) | INTRAMUSCULAR | Status: DC | PRN

## 2021-05-09 MED ORDER — ACETAMINOPHEN 650 MG RE SUPP: 650.0000 mg | Freq: Four times a day (QID) | RECTAL | Status: DC | PRN

## 2021-05-09 MED ORDER — LACTATED RINGERS IV SOLN: INTRAVENOUS | Status: DC

## 2021-05-09 MED ORDER — IPRATROPIUM-ALBUTEROL 0.5-2.5 (3) MG/3ML IN SOLN
3.0000 mL | Freq: Once | RESPIRATORY_TRACT | Status: AC
  Administered 2021-05-09: 3 mL via RESPIRATORY_TRACT
  Filled 2021-05-09: qty 3

## 2021-05-09 MED ORDER — SPIRONOLACTONE 25 MG PO TABS
50.0000 mg | ORAL_TABLET | Freq: Every day | ORAL | Status: DC
  Administered 2021-05-10 – 2021-05-12 (×3): 50 mg via ORAL
  Filled 2021-05-09 (×3): qty 2

## 2021-05-09 MED ORDER — LOSARTAN POTASSIUM 50 MG PO TABS
100.0000 mg | ORAL_TABLET | Freq: Every day | ORAL | Status: DC
  Administered 2021-05-09 – 2021-05-12 (×4): 100 mg via ORAL
  Filled 2021-05-09 (×4): qty 2

## 2021-05-09 MED ORDER — HYDRALAZINE HCL 50 MG PO TABS
100.0000 mg | ORAL_TABLET | Freq: Three times a day (TID) | ORAL | Status: DC
  Administered 2021-05-09 – 2021-05-12 (×9): 100 mg via ORAL
  Filled 2021-05-09 (×9): qty 2

## 2021-05-09 MED ORDER — METFORMIN HCL 500 MG PO TABS
250.0000 mg | ORAL_TABLET | Freq: Two times a day (BID) | ORAL | Status: DC
  Administered 2021-05-10 – 2021-05-12 (×4): 250 mg via ORAL
  Filled 2021-05-09 (×5): qty 1

## 2021-05-09 MED ORDER — ALBUTEROL SULFATE (2.5 MG/3ML) 0.083% IN NEBU: 2.5000 mg | INHALATION_SOLUTION | Freq: Four times a day (QID) | RESPIRATORY_TRACT | Status: DC | PRN

## 2021-05-09 MED ORDER — IPRATROPIUM-ALBUTEROL 0.5-2.5 (3) MG/3ML IN SOLN
3.0000 mL | Freq: Three times a day (TID) | RESPIRATORY_TRACT | Status: DC
  Administered 2021-05-10 – 2021-05-12 (×7): 3 mL via RESPIRATORY_TRACT
  Filled 2021-05-09 (×7): qty 3

## 2021-05-09 MED ORDER — ACETAMINOPHEN 325 MG PO TABS
650.0000 mg | ORAL_TABLET | Freq: Four times a day (QID) | ORAL | Status: DC | PRN
  Administered 2021-05-10: 10:00:00 650 mg via ORAL
  Filled 2021-05-09: qty 2

## 2021-05-09 MED ORDER — GABAPENTIN 300 MG PO CAPS
600.0000 mg | ORAL_CAPSULE | Freq: Two times a day (BID) | ORAL | Status: DC
  Administered 2021-05-09 – 2021-05-12 (×6): 600 mg via ORAL
  Filled 2021-05-09 (×6): qty 2

## 2021-05-09 MED ORDER — PANTOPRAZOLE SODIUM 40 MG IV SOLR
40.0000 mg | Freq: Two times a day (BID) | INTRAVENOUS | Status: DC
  Administered 2021-05-09: 40 mg via INTRAVENOUS
  Filled 2021-05-09: qty 40

## 2021-05-09 MED ORDER — IPRATROPIUM-ALBUTEROL 0.5-2.5 (3) MG/3ML IN SOLN: 3.0000 mL | Freq: Four times a day (QID) | RESPIRATORY_TRACT | Status: DC

## 2021-05-09 MED ORDER — ATORVASTATIN CALCIUM 40 MG PO TABS
80.0000 mg | ORAL_TABLET | Freq: Every day | ORAL | Status: DC
  Administered 2021-05-09 – 2021-05-12 (×4): 80 mg via ORAL
  Filled 2021-05-09 (×4): qty 2

## 2021-05-09 MED ORDER — AMLODIPINE BESYLATE 10 MG PO TABS
10.0000 mg | ORAL_TABLET | Freq: Every day | ORAL | Status: DC
  Administered 2021-05-09 – 2021-05-12 (×4): 10 mg via ORAL
  Filled 2021-05-09 (×4): qty 1

## 2021-05-09 MED ORDER — IPRATROPIUM-ALBUTEROL 0.5-2.5 (3) MG/3ML IN SOLN
3.0000 mL | Freq: Four times a day (QID) | RESPIRATORY_TRACT | Status: DC
Start: 2021-05-09 — End: 2021-05-09
  Administered 2021-05-09: 3 mL via RESPIRATORY_TRACT
  Filled 2021-05-09: qty 3

## 2021-05-09 MED ORDER — PANTOPRAZOLE SODIUM 40 MG IV SOLR
40.0000 mg | Freq: Once | INTRAVENOUS | Status: AC
  Administered 2021-05-09: 40 mg via INTRAVENOUS
  Filled 2021-05-09: qty 40

## 2021-05-09 MED ORDER — ONDANSETRON HCL 4 MG PO TABS: 4.0000 mg | ORAL_TABLET | Freq: Four times a day (QID) | ORAL | Status: DC | PRN

## 2021-05-09 MED ORDER — NITROGLYCERIN 0.4 MG SL SUBL: 0.4000 mg | SUBLINGUAL_TABLET | SUBLINGUAL | Status: DC | PRN

## 2021-05-09 NOTE — Progress Notes (Signed)
[facility] will assume care on arrival to accepting facility. Until arrival, care as per EDP. However, [facility] available 24/7 for questions and assistance.   Nursing staff please page [facility] and Consults [phone]) as soon as the patient arrives to the hospital.  Kristopher Oppenheim, DO

## 2021-05-09 NOTE — ED Notes (Signed)
Pt did not want a frozen dinner.and choose crackers and like snacks

## 2021-05-09 NOTE — ED Notes (Signed)
RT Note: Glucose via finger stick obtained/RN made aware.

## 2021-05-09 NOTE — ED Provider Notes (Signed)
[facility] EMERGENCY DEPT Provider Note   CSN: [id number] Arrival date & time: 05/09/21  0150     History Chief Complaint  Patient presents with   Shortness of Breath    [name] is a 70 y.o. female.  The history is provided by the patient.  Shortness of Breath She has history of hypertension, diabetes, hyperlipidemia, coronary artery disease, peripheral vascular disease, COPD and comes in because shortness of breath and choking when she lays down.  She gets a feeling like her throat is closing off when she coughs but cough is nonproductive.  Symptoms have been getting worse over the last 3 days since she stopped smoking.  She denies chest pain, heaviness, tightness, pressure.  She denies fever, chills, sweats.  She denies nausea, vomiting, diarrhea.  She has noted some swelling in her legs.   Past Medical History:  Diagnosis Date   Anemia 12/26/2015   Anxiety    Arthritis    Blood transfusion without reported diagnosis    Chronic pain    resolved per pt 04/12/20   COPD (chronic obstructive pulmonary disease) ([place])    Coronary artery disease    Depression    Diabetes mellitus without complication (HCC)    type 2   GERD (gastroesophageal reflux disease)    Heart murmur    since birth; Echo 02/18/19: LVEF 30%, grade 1 diastolic dysfunction, mild AS (mean grad 12 mmHg), trace MR/PR, mild TR, PASP 32 mmHg     Hyperlipemia    Hypertension    PVD (peripheral vascular disease) ([place])    right SFA stent 02/11/17 by Dr. Einar Gip   Sleep apnea    does not use cpap   Wears glasses     Patient Active Problem List   Diagnosis Date Noted   Osteoarthritis of left knee 04/18/2020   Tobacco use disorder 09/18/2018   Iron deficiency anemia 04/09/2018   Symptomatic anemia 12/26/2015   Hypokalemia 12/26/2015   Type 2 diabetes mellitus with peripheral vascular disease ([place]) 12/26/2015   Claudication in peripheral vascular disease ([place]) 10/05/2013   Discomfort  in chest 05/11/2012   Nicotine dependence 05/11/2012   Bradycardia 05/11/2012   HTN (hypertension) 05/11/2012   Back pain 05/11/2012    Past Surgical History:  Procedure Laterality Date   ABDOMINAL HYSTERECTOMY     CARDIAC CATHETERIZATION     CATARACT EXTRACTION     CESAREAN SECTION     COLONOSCOPY N/A 01/08/2013   Procedure: COLONOSCOPY;  Surgeon: Beryle Beams, MD;  Location: [facility];  Service: Endoscopy;  Laterality: N/A;   COLONOSCOPY N/A 12/28/2015   Procedure: COLONOSCOPY;  Surgeon: Carol Ada, MD;  Location: [facility];  Service: Endoscopy;  Laterality: N/A;   COLONOSCOPY WITH PROPOFOL N/A 10/05/2020   Procedure: COLONOSCOPY WITH PROPOFOL;  Surgeon: Carol Ada, MD;  Location: [facility];  Service: Endoscopy;  Laterality: N/A;   ENTEROSCOPY N/A 12/28/2015   Procedure: ENTEROSCOPY;  Surgeon: Carol Ada, MD;  Location: [facility];  Service: Endoscopy;  Laterality: N/A;   ENTEROSCOPY N/A 02/20/2018   Procedure: ENTEROSCOPY;  Surgeon: Carol Ada, MD;  Location: [facility];  Service: Endoscopy;  Laterality: N/A;   ENTEROSCOPY N/A 03/27/2018   Procedure: ENTEROSCOPY;  Surgeon: Carol Ada, MD;  Location: [facility];  Service: Endoscopy;  Laterality: N/A;   ENTEROSCOPY N/A 10/05/2020   Procedure: ENTEROSCOPY;  Surgeon: Carol Ada, MD;  Location: [facility];  Service: Endoscopy;  Laterality: N/A;   ESOPHAGOGASTRODUODENOSCOPY N/A 01/08/2013   Procedure: ESOPHAGOGASTRODUODENOSCOPY (EGD);  Surgeon: Beryle Beams, MD;  Location: [facility];  Service: Endoscopy;  Laterality: N/A;   EYE SURGERY     HAND SURGERY     HOT HEMOSTASIS N/A 12/28/2015   Procedure: HOT HEMOSTASIS (ARGON PLASMA COAGULATION/BICAP);  Surgeon: Carol Ada, MD;  Location[facility] END[facility]COPY;  Service: Endoscopy;  Laterality: N/A;   HOT HEMOSTASIS N/A 02/20/2018   Procedure: HOT HEMOSTASIS (ARGON PLASMA COAGULATION/BICAP);  Surgeon: Carol Ada, MD;  Location: [facility];  Service: Endoscopy;   Laterality: N/A;   HOT HEMOSTASIS N/A 03/27/2018   Procedure: HOT HEMOSTASIS (ARGON PLASMA COAGULATION/BICAP);  Surgeon: Carol Ada, MD;  Location: [facility];  Service: Endoscopy;  Laterality: N/A;   HOT HEMOSTASIS N/A 10/05/2020   Procedure: HOT HEMOSTASIS (ARGON PLASMA COAGULATION/BICAP);  Surgeon: Carol Ada, MD;  Location: [facility];  Service: Endoscopy;  Laterality: N/A;   LEFT HEART CATHETERIZATION WITH CORONARY ANGIOGRAM N/A 03/09/2013   Procedure: LEFT HEART CATHETERIZATION WITH CORONARY ANGIOGRAM;  Surgeon: Laverda Page, MD;  Location[facility] CATH LAB;  Service: Cardiovascular;  Laterality: N/A;   LOWER EXTREMITY ANGIOGRAM N/A 12/29/2012   Procedure: LOWER EXTREMITY ANGIOGRAM;  Surgeon: Laverda Page, MD;  Location: [facility] CATH LAB;  Service: Cardiovascular;  Laterality: N/A;   LOWER EXTREMITY ANGIOGRAM N/A 10/05/2013   Procedure: LOWER EXTREMITY ANGIOGRAM;  Surgeon: Laverda Page, MD;  Location: [facility] CATH LAB;  Service: Cardiovascular;  Laterality: N/A;   LOWER EXTREMITY ANGIOGRAPHY N/A 02/11/2017   Procedure: Lower Extremity Angiography;  Surgeon: Adrian Prows, MD;  Location: [place] CV LAB;  Service: Cardiovascular;  Laterality: N/A;   PERIPHERAL VASCULAR BALLOON ANGIOPLASTY  02/11/2017   Procedure: PERIPHERAL VASCULAR BALLOON ANGIOPLASTY;  Surgeon: Adrian Prows, MD;  Location: [place] CV LAB;  Service: Cardiovascular;;  Right SFA   PERIPHERAL VASCULAR INTERVENTION Right 02/11/2017   Procedure: PERIPHERAL VASCULAR INTERVENTION;  Surgeon: Adrian Prows, MD;  Location: [place] CV LAB;  Service: Cardiovascular;  Laterality: Right;  Rt SFA   POLYPECTOMY  10/05/2020   Procedure: POLYPECTOMY;  Surgeon: Carol Ada, MD;  Location: [facility];  Service: Endoscopy;;   stent in right leg  12/29/2012   for blood clot, Dr. Nadyne Coombes   TOTAL KNEE ARTHROPLASTY Left 04/18/2020   TOTAL KNEE ARTHROPLASTY Left 04/18/2020   Procedure: LEFT TOTAL KNEE ARTHROPLASTY;  Surgeon: Meredith Pel, MD;  Location: [place];  Service: Orthopedics;  Laterality: Left;   TYMPANOMASTOIDECTOMY Left 03/08/2014   Procedure: TYMPANOMASTOIDECTOMY LEFT;  Surgeon: Ascencion Dike, MD;  Location: [place];  Service: ENT;  Laterality: Left;   TYMPANOMASTOIDECTOMY  2015   UTERINE FIBROID SURGERY       OB History   No obstetric history on file.     Family History  Problem Relation Age of Onset   Diabetes Mother    Hypertension Mother    Heart disease Mother    Heart disease Father    Hypertension Father    Hypertension Sister    Hypertension Brother    Diabetes Brother    Hypertension Sister    Hypertension Brother    Diabetes Brother    Hypertension Brother    Hypertension Brother    Hypertension Brother     Social History   Tobacco Use   Smoking status: Former    Packs/day: 0.50    Years: 41.00    Pack years: 20.50    Types: Cigarettes   Smokeless tobacco: Never  Vaping Use   Vaping Use: Never used  Substance Use Topics  Alcohol use: Yes    Comment: occasional   Drug use: Yes    Types: Marijuana, Cocaine    Comment: Last use 04/11/20    Home Medications Prior to Admission medications   Medication Sig Start Date End Date Taking? Authorizing Provider  albuterol (PROVENTIL HFA;VENTOLIN HFA) 108 (90 Base) MCG/ACT inhaler Inhale 1-2 puffs into the lungs every 6 (six) hours as needed for wheezing or shortness of breath.    [provider]  amLODipine (NORVASC) 10 MG tablet Take 10 mg by mouth daily.  07/06/19   [provider]  ASPIRIN LOW DOSE 81 MG EC tablet TAKE 1 TABLET(81 MG) BY MOUTH DAILY 06/30/20   Magnant, Charles L, PA-C  atorvastatin (LIPITOR) 80 MG tablet Take 80 mg by mouth daily. 08/08/20   [provider]  gabapentin (NEURONTIN) 600 MG tablet Take 1 tablet (600 mg total) by mouth at bedtime. Patient taking differently: Take 600 mg by mouth at bedtime as needed (pain). 10/02/17   Hyatt, Max T, DPM  hydrALAZINE (APRESOLINE)  100 MG tablet TAKE 1 TABLET BY MOUTH THREE TIMES DAILY 11/27/20   Adrian Prows, MD  losartan (COZAAR) 100 MG tablet Take 100 mg by mouth daily. 08/29/20   [provider]  metFORMIN (GLUCOPHAGE) 500 MG tablet Take 0.5 tablets (250 mg total) by mouth 2 (two) times daily with a meal. 02/13/17   Adrian Prows, MD  nitroGLYCERIN (NITROSTAT) 0.4 MG SL tablet Place 1 tablet (0.4 mg total) under the tongue every 5 (five) minutes as needed for chest pain. 01/13/19 03/01/21  Miquel Dunn, NP  pantoprazole (PROTONIX) 40 MG tablet Take 40 mg by mouth daily. 08/23/20   [provider]  spironolactone (ALDACTONE) 50 MG tablet TAKE 1 TABLET(50 MG) BY MOUTH DAILY 03/09/21   [name], [name], DO    Allergies    Patient has no known allergies.  Review of Systems   Review of Systems  Respiratory:  Positive for shortness of breath.   All other systems reviewed and are negative.  Physical Exam Updated Vital Signs BP (!) 181/64 (BP Location: Right Arm)   Pulse (!) 55   Temp 98.5 F (36.9 C) (Oral)   Resp (!) 21   Ht 5' (1.524 m)   Wt 95.3 kg   LMP  (LMP Unknown)   SpO2 97%   BMI 41.01 kg/m   Physical Exam Vitals and nursing note reviewed.  70 year old female, resting comfortably and in no acute distress. Vital signs are significant for elevated blood pressure, slightly slow heart rate and slightly elevated respiratory rate. Oxygen saturation is 97%, which is normal. Head is normocephalic and atraumatic. PERRLA, EOMI. Oropharynx is clear. Neck is nontender and supple without adenopathy or JVD. Back is nontender and there is no CVA tenderness. Lungs are clear without rales, wheezes, or rhonchi.  However, with forced exhalation, slight wheezing is noted and prolonged exhalation phase is noted. Chest is nontender. Heart has regular rate and rhythm without murmur. Abdomen is soft, flat, nontender without masses or hepatosplenomegaly and peristalsis is normoactive. Extremities have 1+ edema, full  range of motion is present. Skin is warm and dry without rash. Neurologic: Mental status is normal, cranial nerves are intact, moves all extremities equally.  ED Results / Procedures / Treatments   Labs (all labs ordered are listed, but only abnormal results are displayed) Labs Reviewed  COMPREHENSIVE METABOLIC PANEL - Abnormal; Notable for the following components:      Result Value   Glucose,  Bld 114 (*)    All other components within normal limits  CBC WITH DIFFERENTIAL/PLATELET - Abnormal; Notable for the following components:   Hemoglobin 7.9 (*)    HCT 28.5 (*)    MCV 71.1 (*)    MCH 19.7 (*)    MCHC 27.7 (*)    RDW 19.5 (*)    Platelets 590 (*)    All other components within normal limits  OCCULT BLOOD X 1 CARD TO LAB, STOOL - Abnormal; Notable for the following components:   Fecal Occult Bld POSITIVE (*)    All other components within normal limits  RETICULOCYTES - Abnormal; Notable for the following components:   Immature Retic Fract 24.0 (*)    All other components within normal limits  RESP PANEL BY RT-PCR (FLU A&B, COVID) ARPGX2  BRAIN NATRIURETIC PEPTIDE  VITAMIN B12  FOLATE  IRON AND TIBC  FERRITIN  TROPONIN I (HIGH SENSITIVITY)    EKG EKG Interpretation  Date/Time:  Wednesday May 09 2021 02:13:41 EST Ventricular Rate:  56 PR Interval:  146 QRS Duration: 85 QT Interval:  426 QTC Calculation: 412 R Axis:   69 Text Interpretation: Sinus rhythm Atrial premature complexes When compared with ECG of 04/15/2021, No significant change was found Confirmed by Delora Fuel ([id number]) on 05/09/2021 2:51:14 AM  Radiology DG Chest Port 1 View  Result Date: 05/09/2021 CLINICAL DATA:  Dyspnea EXAM: PORTABLE CHEST 1 VIEW COMPARISON:  04/15/2021 FINDINGS: Increasing opacity at the lung bases may be artifactual and related to semi upright positioning. The lungs are symmetrically well expanded and are clear. No pneumothorax or pleural effusion. Mild cardiomegaly is  stable. Pulmonary vascularity is normal. No acute bone abnormality. IMPRESSION: No active disease.  Stable cardiomegaly Electronically Signed   By: Fidela Salisbury M.D.   On: 05/09/2021 02:37    Procedures Procedures   Medications Ordered in ED Medications  ipratropium-albuterol (DUONEB) 0.5-2.5 (3) MG/3ML nebulizer solution 3 mL (3 mLs Nebulization Given 05/09/21 0214)    ED Course  I have reviewed the triage vital signs and the nursing notes.  Pertinent labs & imaging results that were available during my care of the patient were reviewed by me and considered in my medical decision making (see chart for details). Clinical Course as of 05/09/21 0700  Wed May 09, 2021  0450 Hemoglobin(!): 7.9 [CH]    Clinical Course User Index [CH] Ulyess Blossom   MDM Rules/Calculators/A&P                         Shortness of breath worse when supine suspicious for heart failure.  Consider COPD exacerbation.  Will check chest x-ray to rule out pneumonia.  Old records are reviewed, and it is noted that she was seen on 04/15/2021 with a which time she was diagnosed with COPD exacerbation.  Will check chest x-ray, ECG, screening labs and give therapeutic trial of albuterol with ipratropium.  May need to be on a diuretic given presence of edema.  ECG shows no acute changes, chest x-ray shows no cardiomegaly or pulmonary vascular congestion.  Labs show normal troponin and BNP, normal metabolic panel, but CBC shows hemoglobin of 7.9 compared with 9.4 on 04/15/2021 and 12.2 on 04/12/2020.  RBC indices are microcytic and hypochromic with elevated RDW consistent with iron deficiency anemia.  Platelet count is also elevated, but has been so chronically.  Rectal exam is done showing dark brown stool which is sent for Hemoccult testing.  Further  review of old records shows that she had seen oncology for iron deficiency anemia in 2019 and endoscopy showed GI blood loss secondary to angiodysplasia.  She is currently  taking iron once a day.  Blood is sent for anemia panel.  I discussed options for management at this point, and patient has expressed desire to be admitted for GI evaluation and possible transfusion.  Case is discussed with Dr. Vallarie Mare of [facility], who agrees to admit the patient..  Final Clinical Impression(s) / ED Diagnoses Final diagnoses:  Symptomatic anemia  Guaiac positive stools    Rx / DC Orders ED Discharge Orders     None        Delora Fuel, MD 39/03/00 [phone]

## 2021-05-09 NOTE — ED Triage Notes (Signed)
SHOB and congestion at home. Pt states that she feels that she has mucous stuck in her throat. Hx COPD

## 2021-05-09 NOTE — ED Notes (Signed)
Report given to [facility] 

## 2021-05-09 NOTE — Consult Note (Signed)
Reason for Consult: Guaiac positive stools Referring Physician: [facility]  [name] is an 70 y.o. female.  HPI: [name] is a 70 year old black female with multiple medical problems listed below presented to the emergency room by ambulance with a 2-week history of worsening fatigue and shortness of breath.  She felt her throat was closing off and that she was not getting of enough air. She therefore asked her granddaughter to call the ambulance. She is also had a cough lately which has been nonproductive. She claims she stopped smoking 3 days ago.  She was found to have guaiac positive stool with a hemoglobin of 7.9 g/dL slightly decreased from her baseline hemoglobin of 9.4 g/dL.  She denies having any problems with melena or hematochezia.  She has regular bowel movements and denies having any abdominal pain nausea vomiting.  She has had multiple small bowel enteroscopy's in the past for bleeding AVMs in the duodenum and jejunum her last procedure was done in April of this year she is also had multiple colonoscopies. Her last colonoscopy was done in April this year when multiple polyps were removed she is had multiple polyps removed in the past is also noted to have scattered diverticulosis.  Denies having any chest pain, diaphoresis or chest pressure.  She is on pantoprazole for GERD.  Past Medical History:  Diagnosis Date   Anemia 12/26/2015   Anxiety    Arthritis    Blood transfusion without reported diagnosis    Chronic pain    resolved per pt 04/12/20   COPD (chronic obstructive pulmonary disease) (HCC)    Coronary artery disease    Depression    Diabetes mellitus without complication (HCC)    type 2   GERD (gastroesophageal reflux disease)    Heart murmur    since birth; Echo 02/18/19: LVEF 48%, grade 1 diastolic dysfunction, mild AS (mean grad 12 mmHg), trace MR/PR, mild TR, PASP 32 mmHg     Hyperlipemia    Hypertension    PVD (peripheral vascular disease)  ([place])    right SFA stent 02/11/17 by Dr. Einar Gip   Sleep apnea    does not use cpap   Wears glasses    Past Surgical History:  Procedure Laterality Date   ABDOMINAL HYSTERECTOMY     CARDIAC CATHETERIZATION     CATARACT EXTRACTION     CESAREAN SECTION     COLONOSCOPY N/A 01/08/2013   Procedure: COLONOSCOPY;  Surgeon: Beryle Beams, MD;  Location: [facility];  Service: Endoscopy;  Laterality: N/A;   COLONOSCOPY N/A 12/28/2015   Procedure: COLONOSCOPY;  Surgeon: Carol Ada, MD;  Location: [facility];  Service: Endoscopy;  Laterality: N/A;   COLONOSCOPY WITH PROPOFOL N/A 10/05/2020   Procedure: COLONOSCOPY WITH PROPOFOL;  Surgeon: Carol Ada, MD;  [facility];  Service: Endoscopy;  Laterality: N/A;   ENTEROSCOPY N/A 12/28/2015   Procedure: ENTEROSCOPY;  Surgeon: Carol Ada, MD;  Location: [facility];  Service: Endoscopy;  Laterality: N/A;   ENTEROSCOPY N/A 02/20/2018   Procedure: ENTEROSCOPY;  Surgeon: Carol Ada, MD;  [facility];  Service: Endoscopy;  Laterality: N/A;   ENTEROSCOPY N/A 03/27/2018   Procedure: ENTEROSCOPY;  Surgeon: Carol Ada, MD;  [facility];  Service: Endoscopy;  Laterality: N/A;   ENTEROSCOPY N/A 10/05/2020   Procedure: ENTEROSCOPY;  Surgeon: Carol Ada, MD;  [facility];  Service: Endoscopy;  Laterality: N/A;   ESOPHAGOGASTRODUODENOSCOPY N/A 01/08/2013   Procedure: ESOPHAGOGASTRODUODENOSCOPY (EGD);  Surgeon: Tory Emerald  Benson Norway, MD;  Location: [facility];  Service: Endoscopy;  Laterality: N/A;   EYE SURGERY     HAND SURGERY     HOT HEMOSTASIS N/A 12/28/2015   Procedure: HOT HEMOSTASIS (ARGON PLASMA COAGULATION/BICAP);  Surgeon: Carol Ada, MD;  Location: [facility] ENDOSCOPY;  Service: Endoscopy;  Laterality: N/A;   HOT HEMOSTASIS N/A 02/20/2018   Procedure: HOT HEMOSTASIS (ARGON PLASMA COAGULATION/BICAP);  Surgeon: Carol Ada, MD;  Location: [facility];  Service: Endoscopy;  Laterality: N/A;   HOT HEMOSTASIS N/A  03/27/2018   Procedure: HOT HEMOSTASIS (ARGON PLASMA COAGULATION/BICAP);  Surgeon: Carol Ada, MD;  Location: [facility];  Service: Endoscopy;  Laterality: N/A;   HOT HEMOSTASIS N/A 10/05/2020   Procedure: HOT HEMOSTASIS (ARGON PLASMA COAGULATION/BICAP);  Surgeon: Carol Ada, MD;  Location: [facility];  Service: Endoscopy;  Laterality: N/A;   LEFT HEART CATHETERIZATION WITH CORONARY ANGIOGRAM N/A 03/09/2013   Procedure: LEFT HEART CATHETERIZATION WITH CORONARY ANGIOGRAM;  Surgeon: Laverda Page, MD;  Location: [facility] CATH LAB;  Service: Cardiovascular;  Laterality: N/A;   LOWER EXTREMITY ANGIOGRAM N/A 12/29/2012   Procedure: LOWER EXTREMITY ANGIOGRAM;  Surgeon: Laverda Page, MD;  Location: [facility] CATH LAB;  Service: Cardiovascular;  Laterality: N/A;   LOWER EXTREMITY ANGIOGRAM N/A 10/05/2013   Procedure: LOWER EXTREMITY ANGIOGRAM;  Surgeon: Laverda Page, MD;  Location: [facility] LAB;  Service: Cardiovascular;  Laterality: N/A;   LOWER EXTREMITY ANGIOGRAPHY N/A 02/11/2017   Procedure: Lower Extremity Angiography;  Surgeon: Adrian Prows, MD;  Location: [facility] [facility] CV LAB;  Service: Cardiovascular;  Laterality: N/A;   PERIPHERAL VASCULAR BALLOON ANGIOPLASTY  02/11/2017   Procedure: PERIPHERAL VASCULAR BALLOON ANGIOPLASTY;  Surgeon: Adrian Prows, MD;  Location: [facility] CV LAB;  Service: Cardiovascular;;  Right SFA   PERIPHERAL VASCULAR INTERVENTION Right 02/11/2017   Procedure: PERIPHERAL VASCULAR INTERVENTION;  Surgeon: Adrian Prows, MD;  Location: [place] CV LAB;  Service: Cardiovascular;  Laterality: Right;  Rt SFA   POLYPECTOMY  10/05/2020   Procedure: POLYPECTOMY;  Surgeon: Carol Ada, MD;  [facility];  Service: Endoscopy;;   stent in right leg  12/29/2012   for blood clot, Dr. Nadyne Coombes   TOTAL KNEE ARTHROPLASTY Left 04/18/2020   TOTAL KNEE ARTHROPLASTY Left 04/18/2020   Procedure: LEFT TOTAL KNEE ARTHROPLASTY;  Surgeon: Meredith Pel, MD;  Location: [place] [facility];  Service:  Orthopedics;  Laterality: Left;   TYMPANOMASTOIDECTOMY Left 03/08/2014   Procedure: TYMPANOMASTOIDECTOMY LEFT;  Surgeon: Ascencion Dike, MD;  Location: [facility];  Service: ENT;  Laterality: Left;   TYMPANOMASTOIDECTOMY  2015   UTERINE FIBROID SURGERY     Family History  Problem Relation Age of Onset   Diabetes Mother    Hypertension Mother    Heart disease Mother    Heart disease Father    Hypertension Father    Hypertension Sister    Hypertension Brother    Diabetes Brother    Hypertension Sister    Hypertension Brother    Diabetes Brother    Hypertension Brother    Hypertension Brother    Hypertension Brother    Social History:  reports that she has quit smoking. Her smoking use included cigarettes. She has a 20.50 pack-year smoking history. She has never used smokeless tobacco. She reports current alcohol use. She reports current drug use. Drugs: Marijuana and Cocaine.  Allergies: No Known Allergies  Medications: I have reviewed the patient's current medications. Prior to Admission:  Medications Prior to Admission  Medication Sig Dispense Refill Last Dose  albuterol (PROVENTIL HFA;VENTOLIN HFA) 108 (90 Base) MCG/ACT inhaler Inhale 1-2 puffs into the lungs every 6 (six) hours as needed for wheezing or shortness of breath.   05/09/2021   amLODipine (NORVASC) 10 MG tablet Take 10 mg by mouth daily.    Past Week   ASPIRIN LOW DOSE 81 MG EC tablet TAKE 1 TABLET(81 MG) BY MOUTH DAILY 30 tablet 0 05/08/2021   atorvastatin (LIPITOR) 80 MG tablet Take 80 mg by mouth daily.   05/08/2021   doxycycline (VIBRAMYCIN) 100 MG capsule Take 100 mg by mouth 2 (two) times daily. For 7 days- Needs refills   Past Week   ferrous sulfate 325 (65 FE) MG tablet Take 325 mg by mouth daily.   Past Week   gabapentin (NEURONTIN) 600 MG tablet Take 1 tablet (600 mg total) by mouth at bedtime. (Patient taking differently: Take 600 mg by mouth 2 (two) times daily.) 30 tablet 11 05/08/2021    hydrALAZINE (APRESOLINE) 100 MG tablet TAKE 1 TABLET BY MOUTH THREE TIMES DAILY 270 tablet 1 05/08/2021   losartan (COZAAR) 100 MG tablet Take 100 mg by mouth daily.   05/08/2021   metFORMIN (GLUCOPHAGE) 500 MG tablet Take 0.5 tablets (250 mg total) by mouth 2 (two) times daily with a meal.   05/08/2021   pantoprazole (PROTONIX) 40 MG tablet Take 40 mg by mouth daily.   05/08/2021   predniSONE (DELTASONE) 20 MG tablet Take 20 mg by mouth See admin instructions. Take 3 tablets by mouth daily for 4 days-needs refills   Past Week   spironolactone (ALDACTONE) 50 MG tablet TAKE 1 TABLET(50 MG) BY MOUTH DAILY 90 tablet 3 05/08/2021   nitroGLYCERIN (NITROSTAT) 0.4 MG SL tablet Place 1 tablet (0.4 mg total) under the tongue every 5 (five) minutes as needed for chest pain. 90 tablet 3    Scheduled:  amLODipine  10 mg Oral Daily   atorvastatin  80 mg Oral Daily   gabapentin  600 mg Oral BID   hydrALAZINE  100 mg Oral TID   ipratropium-albuterol  3 mL Nebulization QID   losartan  100 mg Oral Daily   metFORMIN  250 mg Oral BID WC   pantoprazole (PROTONIX) IV  40 mg Intravenous Q12H   [START ON 05/10/2021] spironolactone  50 mg Oral Daily   Continuous:  lactated ringers     JJO:[id number] **OR** acetaminophen, albuterol, nitroGLYCERIN, ondansetron **OR** ondansetron (ZOFRAN) IV  Results for orders placed or performed during the hospital encounter of 05/09/21 (from the past 48 hour(s))  Troponin I (High Sensitivity)     Status: None   Collection Time: 05/09/21  2:03 AM  Result Value Ref Range   Troponin I (High Sensitivity) 11 <18 ng/L    Comment: (NOTE) Elevated high sensitivity troponin I (hsTnI) values and significant  changes across serial measurements may suggest ACS but many other  chronic and acute conditions are known to elevate hsTnI results.  Refer to the "Links" section for chest pain algorithms and additional  guidance. Performed at [facility], [address] [place], [place] [address]   Comprehensive metabolic panel     Status: Abnormal   Collection Time: 05/09/21  2:03 AM  Result Value Ref Range   Sodium 142 135 - 145 mmol/L   Potassium 3.8 3.5 - 5.1 mmol/L   Chloride 107 98 - 111 mmol/L   CO2 26 22 - 32 mmol/L   Glucose, Bld 114 (H) 70 - 99 mg/dL    Comment: Glucose  reference range applies only to samples taken after fasting for at least 8 hours.   BUN 13 8 - 23 mg/dL   Creatinine, Ser 0.70 0.44 - 1.00 mg/dL   Calcium 9.0 8.9 - 10.3 mg/dL   Total Protein 6.5 6.5 - 8.1 g/dL   Albumin 3.5 3.5 - 5.0 g/dL   AST 15 15 - 41 U/L   ALT 11 0 - 44 U/L   Alkaline Phosphatase 66 38 - 126 U/L   Total Bilirubin 0.4 0.3 - 1.2 mg/dL   GFR, Estimated >60 >60 mL/min    Comment: (NOTE) Calculated using the CKD-EPI Creatinine Equation (2021)    Anion gap 9 5 - 15    Comment: Performed at [facility], [address] [place], [place] [address]  CBC with Differential     Status: Abnormal   Collection Time: 05/09/21  2:03 AM  Result Value Ref Range   WBC 7.2 4.0 - 10.5 K/uL   RBC 4.01 3.87 - 5.11 MIL/uL   Hemoglobin 7.9 (L) 12.0 - 15.0 g/dL    Comment: Reticulocyte Hemoglobin testing may be clinically indicated, consider ordering this additional test [id number]    HCT 28.5 (L) 36.0 - 46.0 %   MCV 71.1 (L) 80.0 - 100.0 fL   MCH 19.7 (L) 26.0 - 34.0 pg   MCHC 27.7 (L) 30.0 - 36.0 g/dL   RDW 19.5 (H) 11.5 - 15.5 %   Platelets 590 (H) 150 - 400 K/uL   nRBC 0.0 0.0 - 0.2 %   Neutrophils Relative % 66 %   Neutro Abs 4.8 1.7 - 7.7 K/uL   Lymphocytes Relative 19 %   Lymphs Abs 1.4 0.7 - 4.0 K/uL   Monocytes Relative 11 %   Monocytes Absolute 0.8 0.1 - 1.0 K/uL   Eosinophils Relative 3 %   Eosinophils Absolute 0.2 0.0 - 0.5 K/uL   Basophils Relative 1 %   Basophils Absolute 0.0 0.0 - 0.1 K/uL   Immature Granulocytes 0 %   Abs Immature Granulocytes 0.01 0.00 - 0.07 K/uL    Comment: Performed at [facility], [address], [place], [place] [address]  Brain natriuretic peptide     Status: None   Collection Time: 05/09/21  2:03 AM  Result Value Ref Range   B Natriuretic Peptide 98.8 0.0 - 100.0 pg/mL    Comment: Performed at [facility], [address] [place], [place] [address]  Vitamin B12     Status: None   Collection Time: 05/09/21  2:03 AM  Result Value Ref Range   Vitamin B-12 337 180 - 914 pg/mL    Comment: (NOTE) This assay is not validated for testing neonatal or myeloproliferative syndrome specimens for Vitamin B12 levels. Performed at [facility], [place] [address] [place], [place] [address]   Folate     Status: None   Collection Time: 05/09/21  2:03 AM  Result Value Ref Range   Folate 20.6 >5.9 ng/mL    Comment: Performed at [facility], [place] [address], [place], [place] [address]  Iron and TIBC     Status: Abnormal   Collection Time: 05/09/21  2:03 AM  Result Value Ref Range   Iron 12 (L) 28 - 170 ug/dL   TIBC 430 250 - 450 ug/dL   Saturation Ratios 3 (L) 10.4 - 31.8 %   UIBC 418 ug/dL    Comment: Performed at [facility], [place],  [place] [address]  Ferritin     Status: Abnormal   Collection Time: 05/09/21  2:03 AM  Result Value Ref Range   Ferritin 6 (L) 11 - 307 ng/mL    Comment: Performed at [facility], [place] [address], [place], [place] [address]  Reticulocytes     Status: Abnormal   Collection Time: 05/09/21  2:03 AM  Result Value Ref Range   Retic Ct Pct 1.4 0.4 - 3.1 %   RBC. 3.97 3.87 - 5.11 MIL/uL   Retic Count, Absolute 56.8 19.0 - 186.0 K/uL   Immature Retic Fract 24.0 (H) 2.3 - 15.9 %    Comment: Performed at [facility], [address] [place], [place] [address]  Resp Panel by RT-PCR (Flu A&B, Covid) Nasopharyngeal Swab     Status: None   Collection Time: 05/09/21  2:04 AM   Specimen: Nasopharyngeal Swab; Nasopharyngeal(NP) swabs in vial transport medium   Result Value Ref Range   SARS Coronavirus 2 by RT PCR NEGATIVE NEGATIVE    Comment: (NOTE) SARS-CoV-2 target nucleic acids are NOT DETECTED.  The SARS-CoV-2 RNA is generally detectable in upper respiratory specimens during the acute phase of infection. The lowest concentration of SARS-CoV-2 viral copies this assay can detect is 138 copies/mL. A negative result does not preclude SARS-Cov-2 infection and should not be used as the sole basis for treatment or other patient management decisions. A negative result may occur with  improper specimen collection/handling, submission of specimen other than nasopharyngeal swab, presence of viral mutation(s) within the areas targeted by this assay, and inadequate number of viral copies(<138 copies/mL). A negative result must be combined with clinical observations, patient history, and epidemiological information. The expected result is Negative.  Fact Sheet for Patients:  EntrepreneurPulse.com.au  Fact Sheet for Healthcare Providers:  IncredibleEmployment.be  This test is no t yet approved or cleared by the [place] FDA and  has been authorized for detection and/or diagnosis of SARS-CoV-2 by FDA under an Emergency Use Authorization (EUA). This EUA will remain  in effect (meaning this test can be used) for the duration of the COVID-19 declaration under Section 564(b)(1) of the Act, 21 U.S.C.section 360bbb-3(b)(1), unless the authorization is terminated  or revoked sooner.       Influenza A by PCR NEGATIVE NEGATIVE   Influenza B by PCR NEGATIVE NEGATIVE    Comment: (NOTE) The Xpert Xpress SARS-CoV-2/FLU/RSV plus assay is intended as an aid in the diagnosis of influenza from Nasopharyngeal swab specimens and should not be used as a sole basis for treatment. Nasal washings and aspirates are unacceptable for Xpert Xpress SARS-CoV-2/FLU/RSV testing.  Fact Sheet for  Patients: EntrepreneurPulse.com.au  Fact Sheet for Healthcare Providers: IncredibleEmployment.be  This test is not yet approved or cleared by the [place] FDA and has been authorized for detection and/or diagnosis of SARS-CoV-2 by FDA under an Emergency Use Authorization (EUA). This EUA will remain in effect (meaning this test can be used) for the duration of the COVID-19 declaration under Section 564(b)(1) of the Act, 21 U.S.C. section 360bbb-3(b)(1), unless the authorization is terminated or revoked.  Performed at [facility], [address], [place], [place] [address]   Occult blood card to lab, stool Provider will collect     Status: Abnormal   Collection Time: 05/09/21  3:20 AM  Result Value Ref Range   Fecal Occult Bld POSITIVE (A) NEGATIVE    Comment: Performed at [facility], [address] [place], [place] [address]  CBG monitoring, ED     Status: Abnormal   Collection Time: 05/09/21  9:23 AM  Result Value Ref Range   Glucose-Capillary 110 (H) 70 - 99 mg/dL    Comment: Glucose reference range applies only to samples taken after fasting for at least 8 hours.  Hemoglobin and hematocrit, blood     Status: Abnormal   Collection Time: 05/09/21  3:20 PM  Result Value Ref Range   Hemoglobin 8.5 (L) 12.0 - 15.0 g/dL   HCT 30.6 (L) 36.0 - 46.0 %    Comment: Performed at [facility], [place] [address] [place], [place] [address]  Glucose, capillary     Status: None   Collection Time: 05/09/21  4:54 PM  Result Value Ref Range   Glucose-Capillary 98 70 - 99 mg/dL    Comment: Glucose reference range applies only to samples taken after fasting for at least 8 hours.    DG Chest Port 1 View  Result Date: 05/09/2021 CLINICAL DATA:  Dyspnea EXAM: PORTABLE CHEST 1 VIEW COMPARISON:  04/15/2021 FINDINGS: Increasing opacity at the lung bases may be artifactual and related to semi upright  positioning. The lungs are symmetrically well expanded and are clear. No pneumothorax or pleural effusion. Mild cardiomegaly is stable. Pulmonary vascularity is normal. No acute bone abnormality. IMPRESSION: No active disease.  Stable cardiomegaly Electronically Signed   By: Fidela Salisbury M.D.   On: 05/09/2021 02:37    Review of Systems  Constitutional:  Positive for activity change and fatigue. Negative for appetite change, chills, diaphoresis, fever and unexpected weight change.  HENT: Negative.    Respiratory:  Positive for cough, choking and shortness of breath. Negative for apnea, chest tightness, wheezing and stridor.   Cardiovascular:  Positive for palpitations and leg swelling. Negative for chest pain.  Gastrointestinal:  Negative for abdominal distention, abdominal pain, anal bleeding, blood in stool, constipation and diarrhea.  Endocrine: Negative.   Genitourinary: Negative.   Musculoskeletal:  Positive for arthralgias and joint swelling.  Allergic/Immunologic: Negative.   Neurological:  Positive for weakness. Negative for dizziness, tremors, seizures, syncope, facial asymmetry, speech difficulty, light-headedness, numbness and headaches.  Hematological: Negative.   Psychiatric/Behavioral: Negative.    Blood pressure (!) 168/80, pulse 60, temperature 98.8 F (37.1 C), temperature source Oral, resp. rate (!) 22, height 5' (1.524 m), weight 95.3 kg, SpO2 96 %. Physical Exam Vitals reviewed.  Constitutional:      Appearance: She is obese. She is ill-appearing.  HENT:     Head: Normocephalic and atraumatic.     Mouth/Throat:     Pharynx: Oropharynx is clear.  Eyes:     Extraocular Movements: Extraocular movements intact.     Pupils: Pupils are equal, round, and reactive to light.  Cardiovascular:     Rate and Rhythm: Normal rate and regular rhythm.  Pulmonary:     Effort: Tachypnea present.     Breath sounds: Normal breath sounds.  Abdominal:     General: Bowel sounds are  normal.     Palpations: Abdomen is soft.     Comments: Is a midline scar below the umbilicus from previous C-sections  Skin:    General: Skin is warm and dry.  Neurological:     Mental Status: She is alert and oriented to person, place, and time.   Assessment/Plan: 1) Iron deficiency anemia with guaiac positive stools-patient has a history of multiple small bowel AVM's and therefore in view of this finding she will need a repeat small bowel  enteroscopy once her respiratory symptoms improve. Tranfuse as needed. 2) GERD on PPI's. 3) History of colonic polyps. 4) HTN/Hyperlipidemia. 5) AODM. 6) COPD. 7) CAD/Morbid obesity Juanita Craver 05/09/2021, 5:04 PM

## 2021-05-09 NOTE — ED Notes (Signed)
This nurse in room with Dr. Roxanne Mins for rectal exam.

## 2021-05-09 NOTE — ED Notes (Signed)
RT note: Pt. seen for RT follow up, resting comfortably in no apparent distress or c/o, own inhaler on bedside table within reach along with call bell, RT to monitor.

## 2021-05-09 NOTE — ED Notes (Addendum)
RT Note: Pt. given snack per RN request while awaiting [facility] for [place], is type II Diabetic with last B.S. of 110 mg/Dl, given cheese and crackers, Ginger Ale and made aware to notify if needed with call bell.

## 2021-05-09 NOTE — ED Notes (Signed)
Pt stated that she felt fine and is not experiencing any difficulty breathing.

## 2021-05-09 NOTE — H&P (Signed)
History and Physical    [name] JOA:[id number] DOB: 06/13/1951 DOA: 05/09/2021  PCP: Benito Mccreedy, MD  Patient coming from: Home.  I have personally briefly reviewed patient's old medical records in [place]  Chief Complaint: Shortness of breath.  HPI: [name] is a 70 y.o. female with medical history significant of iron deficiency anemia, anxiety, depression, osteoarthritis, resolved chronic pain syndrome, COPD, CAD, type II DM, GERD, hyperlipidemia, hypertension, sleep apnea not on CPAP who is coming to the emergency department due to progressively worse dyspnea for almost 2 weeks associated with significant fatigue.  The patient stated that he is unable to do her ADL without getting short of breath.  She denied fever, chills, night sweats, but states she had rhinorrhea and sore throat initially.  No sick contacts.  Denied chest pain, palpitations, diaphoresis, PND, orthopnea or pitting edema of the lower extremities.  She has had epigastric pain, but denied nausea, emesis, diarrhea, constipation, melena or hematochezia.  She stated her stool color is normal.  No dysuria, frequency or hematuria.  No polyuria, polydipsia, polyphagia or blurred vision.  ED Course: Initial vital signs were temperature 98.5 F, pulse 55, respiratory 21, BP 181/64 mmHg and O2 sat 97% on room air.  The patient received a DuoNeb in the emergency department.  Lab work: Fecal occult blood was positive.  Iron studies showed iron deficiency.  CBC showed a white count of 11.2, hemoglobin 7.9 g/dL platelets 590.  Hemoglobin level is 9.4 g/dL 24 days ago.  Troponin and BNP were normal.  CMP showed a glucose of 114 mg/dL but the rest of the measurements were unremarkable.  Imaging: One-view portable radiograph showed stable cardiomegaly but no active disease.  Review of Systems: As per HPI otherwise all other systems reviewed and are negative.  Past Medical History:  Diagnosis Date    Anemia 12/26/2015   Anxiety    Arthritis    Blood transfusion without reported diagnosis    Chronic pain    resolved per pt 04/12/20   COPD (chronic obstructive pulmonary disease) (HCC)    Coronary artery disease    Depression    Diabetes mellitus without complication (HCC)    type 2   GERD (gastroesophageal reflux disease)    Heart murmur    since birth; Echo 02/18/19: LVEF 60%, grade 1 diastolic dysfunction, mild AS (mean grad 12 mmHg), trace MR/PR, mild TR, PASP 32 mmHg     Hyperlipemia    Hypertension    PVD (peripheral vascular disease) ([place])    right SFA stent 02/11/17 by Dr. Einar Gip   Sleep apnea    does not use cpap   Wears glasses     Past Surgical History:  Procedure Laterality Date   ABDOMINAL HYSTERECTOMY     CARDIAC CATHETERIZATION     CATARACT EXTRACTION     CESAREAN SECTION     COLONOSCOPY N/A 01/08/2013   Procedure: COLONOSCOPY;  Surgeon: Beryle Beams, MD;  Location: [facility];  Service: Endoscopy;  Laterality: N/A;   COLONOSCOPY N/A 12/28/2015   Procedure: COLONOSCOPY;  Surgeon: Carol Ada, MD;  Location: [place];  Service: Endoscopy;  Laterality: N/A;   COLONOSCOPY WITH PROPOFOL N/A 10/05/2020   Procedure: COLONOSCOPY WITH PROPOFOL;  Surgeon: Carol Ada, MD;  Location: [facility];  Service: Endoscopy;  Laterality: N/A;   ENTEROSCOPY N/A 12/28/2015   Procedure: ENTEROSCOPY;  Surgeon: Carol Ada, MD;  Location: [place];  Service: Endoscopy;  Laterality: N/A;   ENTEROSCOPY N/A 02/20/2018  Procedure: ENTEROSCOPY;  Surgeon: Carol Ada, MD;  [facility];  Service: Endoscopy;  Laterality: N/A;   ENTEROSCOPY N/A 03/27/2018   Procedure: ENTEROSCOPY;  Surgeon: Carol Ada, MD;  [facility];  Service: Endoscopy;  Laterality: N/A;   ENTEROSCOPY N/A 10/05/2020   Procedure: ENTEROSCOPY;  Surgeon: Carol Ada, MD;  [facility];  Service: Endoscopy;  Laterality: N/A;   ESOPHAGOGASTRODUODENOSCOPY N/A 01/08/2013    Procedure: ESOPHAGOGASTRODUODENOSCOPY (EGD);  Surgeon: Beryle Beams, MD;  Location: [facility];  Service: Endoscopy;  Laterality: N/A;   EYE SURGERY     HAND SURGERY     HOT HEMOSTASIS N/A 12/28/2015   Procedure: HOT HEMOSTASIS (ARGON PLASMA COAGULATION/BICAP);  Surgeon: Carol Ada, MD;  Location: [facility] ENDOSCOPY;  Service: Endoscopy;  Laterality: N/A;   HOT HEMOSTASIS N/A 02/20/2018   Procedure: HOT HEMOSTASIS (ARGON PLASMA COAGULATION/BICAP);  Surgeon: Carol Ada, MD;  Location: [facility];  Service: Endoscopy;  Laterality: N/A;   HOT HEMOSTASIS N/A 03/27/2018   Procedure: HOT HEMOSTASIS (ARGON PLASMA COAGULATION/BICAP);  Surgeon: Carol Ada, MD;  Location: [facility];  Service: Endoscopy;  Laterality: N/A;   HOT HEMOSTASIS N/A 10/05/2020   Procedure: HOT HEMOSTASIS (ARGON PLASMA COAGULATION/BICAP);  Surgeon: Carol Ada, MD;  Location: [facility];  Service: Endoscopy;  Laterality: N/A;   LEFT HEART CATHETERIZATION WITH CORONARY ANGIOGRAM N/A 03/09/2013   Procedure: LEFT HEART CATHETERIZATION WITH CORONARY ANGIOGRAM;  Surgeon: Laverda Page, MD;  [facility] C[facility] LAB;  Service: Cardiovascular;  Laterality: N/A;   LOWER EXTREMITY ANGIOGRAM N/A 12/29/2012   Procedure: LOWER EXTREMITY ANGIOGRAM;  Surgeon: Laverda Page, MD;  Location: [facility] CATH LAB;  Service: Cardiovascular;  Laterality: N/A;   LOWER EXTREMITY ANGIOGRAM N/A 10/05/2013   Procedure: LOWER EXTREMITY ANGIOGRAM;  Surgeon: Laverda Page, MD;  Location: [facility] C[facility] LAB;  Service: Cardiovascular;  Laterality: N/A;   LOWER EXTREMITY ANGIOGRAPHY N/A 02/11/2017   Procedure: Lower Extremity Angiography;  Surgeon: Adrian Prows, MD;  Location: [place] CV LAB;  Service: Cardiovascular;  Laterality: N/A;   PERIPHERAL VASCULAR BALLOON ANGIOPLASTY  02/11/2017   Procedure: PERIPHERAL VASCULAR BALLOON ANGIOPLASTY;  Surgeon: Adrian Prows, MD;  Location: [place] CV LAB;  Service: Cardiovascular;;  Right SFA   PERIPHERAL VASCULAR  INTERVENTION Right 02/11/2017   Procedure: PERIPHERAL VASCULAR INTERVENTION;  Surgeon: Adrian Prows, MD;  Location: [place] CV LAB;  Service: Cardiovascular;  Laterality: Right;  Rt SFA   POLYPECTOMY  10/05/2020   Procedure: POLYPECTOMY;  Surgeon: Carol Ada, MD;  [facility];  Service: Endoscopy;;   stent in right leg  12/29/2012   for blood clot, Dr. Nadyne Coombes   TOTAL KNEE ARTHROPLASTY Left 04/18/2020   TOTAL KNEE ARTHROPLASTY Left 04/18/2020   Procedure: LEFT TOTAL KNEE ARTHROPLASTY;  Surgeon: Meredith Pel, MD;  Location: [place];  Service: Orthopedics;  Laterality: Left;   TYMPANOMASTOIDECTOMY Left 03/08/2014   Procedure: TYMPANOMASTOIDECTOMY LEFT;  Surgeon: Ascencion Dike, MD;  Location: [place];  Service: ENT;  Laterality: Left;   TYMPANOMASTOIDECTOMY  2015   [place] History  reports that she has quit smoking. Her smoking use included cigarettes. She has a 20.50 pack-year smoking history. She has never used smokeless tobacco. She reports current alcohol use. She reports current drug use. Drugs: Marijuana and Cocaine.  No Known Allergies  Family History  Problem Relation Age of Onset   Diabetes Mother    Hypertension Mother    Heart disease Mother  Heart disease Father    Hypertension Father    Hypertension Sister    Hypertension Brother    Diabetes Brother    Hypertension Sister    Hypertension Brother    Diabetes Brother    Hypertension Brother    Hypertension Brother    Hypertension Brother    Prior to Admission medications   Medication Sig Start Date End Date Taking? Authorizing Provider  albuterol (PROVENTIL HFA;VENTOLIN HFA) 108 (90 Base) MCG/ACT inhaler Inhale 1-2 puffs into the lungs every 6 (six) hours as needed for wheezing or shortness of breath.   Yes [provider]  amLODipine (NORVASC) 10 MG tablet Take 10 mg by mouth daily.  07/06/19  Yes [provider]  ASPIRIN LOW DOSE 81 MG EC  tablet TAKE 1 TABLET(81 MG) BY MOUTH DAILY 06/30/20  Yes Magnant, Charles L, PA-C  atorvastatin (LIPITOR) 80 MG tablet Take 80 mg by mouth daily. 08/08/20  Yes [provider]  doxycycline (VIBRAMYCIN) 100 MG capsule Take 100 mg by mouth 2 (two) times daily. For 7 days- Needs refills   Yes [provider]  ferrous sulfate 325 (65 FE) MG tablet Take 325 mg by mouth daily.   Yes [provider]  gabapentin (NEURONTIN) 600 MG tablet Take 1 tablet (600 mg total) by mouth at bedtime. Patient taking differently: Take 600 mg by mouth 2 (two) times daily. 10/02/17  Yes Hyatt, Max T, DPM  hydrALAZINE (APRESOLINE) 100 MG tablet TAKE 1 TABLET BY MOUTH THREE TIMES DAILY 11/27/20  Yes Adrian Prows, MD  losartan (COZAAR) 100 MG tablet Take 100 mg by mouth daily. 08/29/20  Yes [provider]  metFORMIN (GLUCOPHAGE) 500 MG tablet Take 0.5 tablets (250 mg total) by mouth 2 (two) times daily with a meal. 02/13/17  Yes Adrian Prows, MD  pantoprazole (PROTONIX) 40 MG tablet Take 40 mg by mouth daily. 08/23/20  Yes [provider]  predniSONE (DELTASONE) 20 MG tablet Take 20 mg by mouth See admin instructions. Take 3 tablets by mouth daily for 4 days-needs refills   Yes [provider]  spironolactone (ALDACTONE) 50 MG tablet TAKE 1 TABLET(50 MG) BY MOUTH DAILY 03/09/21  Yes [name], [name], DO  nitroGLYCERIN (NITROSTAT) 0.4 MG SL tablet Place 1 tablet (0.4 mg total) under the tongue every 5 (five) minutes as needed for chest pain. 01/13/19 03/01/21  Miquel Dunn, NP    Physical Exam: Vitals:   05/09/21 1227 05/09/21 1300 05/09/21 1401 05/09/21 1454  BP: (!) 150/73 (!) 161/73  (!) 168/80  Pulse: 63 (!) 58  60  Resp: (!) 24 (!) 25  (!) 22  Temp:   98.5 F (36.9 C) 98.8 F (37.1 C)  TempSrc:   Oral Oral  SpO2: 90% 90%  96%  Weight:      Height:        Constitutional: Chronically ill-appearing.  NAD, calm, comfortable Eyes: PERRL, lids and conjunctivae are  pale. ENMT: Mucous membranes are moist. Posterior pharynx clear of any exudate or lesions. Neck: normal, supple, no masses, no thyromegaly Respiratory: Mildly tachypneic, CTA.  No wheezing, no crackles. No accessory muscle use.  Cardiovascular: Regular rate and rhythm, no murmurs / rubs / gallops. No extremity edema. 2+ pedal pulses. No carotid bruits.  Abdomen: Obese, no distention.  Bowel sounds positive.  Soft, positive epigastric tenderness, no guarding or rebound, no masses palpated. No hepatosplenomegaly.  Musculoskeletal: Mild to moderate generalized weakness.  No clubbing / cyanosis. Good ROM, no contractures. Normal muscle  tone.  Skin: no rashes, lesions, ulcers on limited dermatological examination. Neurologic: CN 2-12 grossly intact. Sensation intact, DTR normal. Strength 5/5 in all 4.  Psychiatric: Normal judgment and insight. Alert and oriented x 3. Normal mood.   Labs on Admission: I have personally reviewed following labs and imaging studies  CBC: Recent Labs  Lab 05/09/21 0203 05/09/21 1520  WBC 7.2  --   NEUTROABS 4.8  --   HGB 7.9* 8.5*  HCT 28.5* 30.6*  MCV 71.1*  --   PLT 590*  --     Basic Metabolic Panel: Recent Labs  Lab 05/09/21 0203  NA 142  K 3.8  CL 107  CO2 26  GLUCOSE 114*  BUN 13  CREATININE 0.70  CALCIUM 9.0    GFR: Estimated Creatinine Clearance: 67.6 mL/min (by C-G formula based on SCr of 0.7 mg/dL).  Liver Function Tests: Recent Labs  Lab 05/09/21 0203  AST 15  ALT 11  ALKPHOS 66  BILITOT 0.4  PROT 6.5  ALBUMIN 3.5   Radiological Exams on Admission: DG Chest Port 1 View  Result Date: 05/09/2021 CLINICAL DATA:  Dyspnea EXAM: PORTABLE CHEST 1 VIEW COMPARISON:  04/15/2021 FINDINGS: Increasing opacity at the lung bases may be artifactual and related to semi upright positioning. The lungs are symmetrically well expanded and are clear. No pneumothorax or pleural effusion. Mild cardiomegaly is stable. Pulmonary vascularity is  normal. No acute bone abnormality. IMPRESSION: No active disease.  Stable cardiomegaly Electronically Signed   By: Fidela Salisbury M.D.   On: 05/09/2021 02:37    Echocardiogram 01/30/2021:  Normal LV systolic function with visual EF 55-60%. Left ventricle cavity  is normal in size. Mild left ventricular hypertrophy. Normal global wall  motion. Normal diastolic filling pattern, normal LAP.  Mild aortic valve stenosis (Peak velocity 2.46m/s, PG 65mmHg, MG 12.16mmHG,  AVA 1.4cm2).  Mild (Grade I) mitral regurgitation.  Mild tricuspid regurgitation. No evidence of pulmonary hypertension.  Mild pulmonic regurgitation.  Compared to study 02/18/2019: AS remains stable otherwise no significant  change.   EKG: Independently reviewed.  Vent. rate 56 BPM PR interval 146 ms QRS duration 85 ms QT/QTcB 426/412 ms P-R-T axes 77 69 68 Sinus rhythm Atrial premature complexes  Assessment/Plan Principal Problem:   GI bleeding Observation/MedSurg. Clear liquid diet. N.p.o. after midnight. Monitor hematocrit and hemoglobin. Transfuse as needed. Pantoprazole 40 mg IVP every 12 hours. GI has been consulted.  Active Problems:   Iron deficiency anemia Due to chronic blood loss. History of bowel AVM. Monitor hematocrit hemoglobin.   Transfuse as needed.    HTN (hypertension) Continue amlodipine 10 mg p.o. daily. Continue losartan 100 mg p.o. daily. Continue hydralazine 100 mg p.o. 3 times daily. Continue spironolactone 50 mg p.o. daily. Monitor BP, renal function and electrolytes.    Type 2 diabetes mellitus with peripheral vascular disease (HCC) Continue metformin 250 mg p.o. twice daily.   CBG monitoring 4 times a day.    Hyperlipemia Continue atorvastatin 80 mg p.o. daily.    COPD (chronic obstructive pulmonary disease) ([place]) Supplemental oxygen as needed. DuoNeb 4 times a day. Finished doxycycline and prednisone earlier this week. The patient stated that she quit smoking last  week.    Coronary artery disease Hold aspirin. Continue atorvastatin.    DVT prophylaxis: SCDs. Code Status:   Full code. Family Communication:   Disposition Plan:   Patient is from:  Home.  Anticipated DC to:  Home.  Anticipated DC date:  05/10/2021 or 05/11/2021.  Anticipated  DC barriers: Clinical status. Consults called:  Gastroenterology Carol Ada, MD). Admission status:  MedSurg/observation.   Severity of Illness: High severity after presenting with progressively worse dyspnea for over a week in the setting of respiratory infection and acute bronchitis producing COPD exacerbation.  Reubin Milan MD [facility]  How to contact the [facility] Attending or Consulting provider [place] or covering provider during after hours [place], for this patient?   Check the care team in [facility] and look for a) attending/consulting [facility] provider listed and b) the [facility] team listed Log into [email or web address] and use [place]'s universal password to access. If you do not have the password, please contact the hospital operator. Locate the [facility] provider you are looking for under [facility] and page to a number that you can be directly reached. If you still have difficulty reaching the provider, please page the [facility] (Director on Call) for the Hospitalists listed on amion for assistance.  05/09/2021, 4:06 PM   This document was prepared using Par[employer]ic and may contain some unintended transcription errors.

## 2021-05-10 DIAGNOSIS — M1990 Unspecified osteoarthritis, unspecified site: Secondary | ICD-10-CM | POA: Diagnosis present

## 2021-05-10 DIAGNOSIS — K31811 Angiodysplasia of stomach and duodenum with bleeding: Secondary | ICD-10-CM | POA: Diagnosis not present

## 2021-05-10 DIAGNOSIS — R011 Cardiac murmur, unspecified: Secondary | ICD-10-CM | POA: Diagnosis present

## 2021-05-10 DIAGNOSIS — E785 Hyperlipidemia, unspecified: Secondary | ICD-10-CM | POA: Diagnosis not present

## 2021-05-10 DIAGNOSIS — J441 Chronic obstructive pulmonary disease with (acute) exacerbation: Secondary | ICD-10-CM | POA: Diagnosis present

## 2021-05-10 DIAGNOSIS — Z7984 Long term (current) use of oral hypoglycemic drugs: Secondary | ICD-10-CM | POA: Diagnosis not present

## 2021-05-10 DIAGNOSIS — E1151 Type 2 diabetes mellitus with diabetic peripheral angiopathy without gangrene: Secondary | ICD-10-CM | POA: Diagnosis not present

## 2021-05-10 DIAGNOSIS — R0602 Shortness of breath: Secondary | ICD-10-CM | POA: Diagnosis not present

## 2021-05-10 DIAGNOSIS — Z90710 Acquired absence of both cervix and uterus: Secondary | ICD-10-CM | POA: Diagnosis not present

## 2021-05-10 DIAGNOSIS — Z20822 Contact with and (suspected) exposure to covid-19: Secondary | ICD-10-CM | POA: Diagnosis not present

## 2021-05-10 DIAGNOSIS — K31819 Angiodysplasia of stomach and duodenum without bleeding: Secondary | ICD-10-CM | POA: Diagnosis not present

## 2021-05-10 DIAGNOSIS — Z833 Family history of diabetes mellitus: Secondary | ICD-10-CM | POA: Diagnosis not present

## 2021-05-10 DIAGNOSIS — Z87891 Personal history of nicotine dependence: Secondary | ICD-10-CM | POA: Diagnosis not present

## 2021-05-10 DIAGNOSIS — R195 Other fecal abnormalities: Secondary | ICD-10-CM

## 2021-05-10 DIAGNOSIS — D62 Acute posthemorrhagic anemia: Secondary | ICD-10-CM | POA: Diagnosis not present

## 2021-05-10 DIAGNOSIS — Z6841 Body Mass Index (BMI) 40.0 and over, adult: Secondary | ICD-10-CM | POA: Diagnosis not present

## 2021-05-10 DIAGNOSIS — Z8249 Family history of ischemic heart disease and other diseases of the circulatory system: Secondary | ICD-10-CM | POA: Diagnosis not present

## 2021-05-10 DIAGNOSIS — K219 Gastro-esophageal reflux disease without esophagitis: Secondary | ICD-10-CM | POA: Diagnosis present

## 2021-05-10 DIAGNOSIS — K921 Melena: Secondary | ICD-10-CM | POA: Diagnosis not present

## 2021-05-10 DIAGNOSIS — Z96652 Presence of left artificial knee joint: Secondary | ICD-10-CM | POA: Diagnosis present

## 2021-05-10 DIAGNOSIS — I10 Essential (primary) hypertension: Secondary | ICD-10-CM | POA: Diagnosis not present

## 2021-05-10 DIAGNOSIS — Z86010 Personal history of colonic polyps: Secondary | ICD-10-CM | POA: Diagnosis not present

## 2021-05-10 DIAGNOSIS — I2510 Atherosclerotic heart disease of native coronary artery without angina pectoris: Secondary | ICD-10-CM | POA: Diagnosis not present

## 2021-05-10 DIAGNOSIS — G894 Chronic pain syndrome: Secondary | ICD-10-CM | POA: Diagnosis not present

## 2021-05-10 DIAGNOSIS — G4730 Sleep apnea, unspecified: Secondary | ICD-10-CM | POA: Diagnosis present

## 2021-05-10 DIAGNOSIS — D509 Iron deficiency anemia, unspecified: Secondary | ICD-10-CM | POA: Diagnosis not present

## 2021-05-10 DIAGNOSIS — Z7982 Long term (current) use of aspirin: Secondary | ICD-10-CM | POA: Diagnosis not present

## 2021-05-10 LAB — CBC
HCT: 29.2 % — ABNORMAL LOW (ref 36.0–46.0)
Hemoglobin: 8.1 g/dL — ABNORMAL LOW (ref 12.0–15.0)
MCH: 19.8 pg — ABNORMAL LOW (ref 26.0–34.0)
MCHC: 27.7 g/dL — ABNORMAL LOW (ref 30.0–36.0)
MCV: 71.2 fL — ABNORMAL LOW (ref 80.0–100.0)
Platelets: 665 10*3/uL — ABNORMAL HIGH (ref 150–400)
RBC: 4.1 MIL/uL (ref 3.87–5.11)
RDW: 19.9 % — ABNORMAL HIGH (ref 11.5–15.5)
WBC: 7.8 10*3/uL (ref 4.0–10.5)
nRBC: 0 % (ref 0.0–0.2)

## 2021-05-10 LAB — BASIC METABOLIC PANEL
Anion gap: 7 (ref 5–15)
BUN: 8 mg/dL (ref 8–23)
CO2: 26 mmol/L (ref 22–32)
Calcium: 8.9 mg/dL (ref 8.9–10.3)
Chloride: 108 mmol/L (ref 98–111)
Creatinine, Ser: 0.72 mg/dL (ref 0.44–1.00)
GFR, Estimated: >60 mL/min (ref >60)
Glucose, Bld: 108 mg/dL — ABNORMAL HIGH (ref 70–99)
Potassium: 3.5 mmol/L (ref 3.5–5.1)
Sodium: 141 mmol/L (ref 135–145)

## 2021-05-10 LAB — HIV ANTIBODY (ROUTINE TESTING W REFLEX): HIV Screen 4th Generation wRfx: NONREACTIVE

## 2021-05-10 LAB — GLUCOSE, CAPILLARY
Glucose-Capillary: 97 mg/dL (ref 70–99)
Glucose-Capillary: 123 mg/dL — ABNORMAL HIGH (ref 70–99)
Glucose-Capillary: 137 mg/dL — ABNORMAL HIGH (ref 70–99)

## 2021-05-10 MED ORDER — INSULIN ASPART 100 UNIT/ML IJ SOLN: 0.0000 [IU] | Freq: Every day | INTRAMUSCULAR | Status: DC

## 2021-05-10 MED ORDER — PANTOPRAZOLE SODIUM 40 MG PO TBEC
40.0000 mg | DELAYED_RELEASE_TABLET | Freq: Every day | ORAL | Status: DC
  Administered 2021-05-10 – 2021-05-12 (×3): 40 mg via ORAL
  Filled 2021-05-10 (×3): qty 1

## 2021-05-10 MED ORDER — INSULIN ASPART 100 UNIT/ML IJ SOLN
0.0000 [IU] | Freq: Three times a day (TID) | INTRAMUSCULAR | Status: DC
  Administered 2021-05-10 – 2021-05-12 (×2): 2 [IU] via SUBCUTANEOUS
  Administered 2021-05-12: 3 [IU] via SUBCUTANEOUS

## 2021-05-10 NOTE — Progress Notes (Signed)
PROGRESS NOTE    [name]  GYF:[id number] DOB: 1951-01-12 DOA: 05/09/2021 PCP: Benito Mccreedy, MD    Brief Narrative:  70 y.o. female with medical history significant of iron deficiency anemia, anxiety, depression, osteoarthritis, resolved chronic pain syndrome, COPD, CAD, type II DM, GERD, hyperlipidemia, hypertension, sleep apnea not on CPAP who is coming to the emergency department due to progressively worse dyspnea for almost 2 weeks associated with significant fatigue.  The patient stated that he is unable to do her ADL without getting short of breath.  She denied fever, chills, night sweats, but states she had rhinorrhea and sore throat initially.  No sick contacts.  Denied chest pain, palpitations, diaphoresis, PND, orthopnea or pitting edema of the lower extremities.  She has had epigastric pain, but denied nausea, emesis, diarrhea, constipation, melena or hematochezia.  She stated her stool color is normal.  No dysuria, frequency or hematuria.  No polyuria, polydipsia, polyphagia or blurred vision.  Assessment & Plan:   Principal Problem:   GI bleeding Active Problems:   HTN (hypertension)   Type 2 diabetes mellitus with peripheral vascular disease (HCC)   Iron deficiency anemia   Hyperlipemia   COPD (chronic obstructive pulmonary disease) (HCC)   Coronary artery disease   COPD exacerbation Noted to have audible wheezing and inability to speak in full sentences this AM, noted by staff and GI Pt has since received neb tx with notable improvement. Pt now w/o wheezing and able to speak in full sentences Would cont with scheduled and PRN nebs for now. If recurrent wheezing, then would start steroids Currently on minimal O2 support HTN BP stable Cont home norvasc, losartan, hydralazine, spironolactone DM2 Metformin was resumed at time of admit Continue on SSI Iron deficiency anemia with possible acute blood loss anemia Hgb stable thus far Seen by GI, recs to  hold off on intervention for now. No need for IV pantoprazole per GI HLD Continued on high dose atorvastatin Presenting LFT's within normal limits CAD Denies chest pains If hgb remains stable, would resume ASA  DVT prophylaxis: SCD's Code Status: Full Family Communication: Pt in room, family not at bedside  Status is: Observation  The patient will require care spanning > 2 midnights and should be moved to inpatient because: severity of illness   Consultants:  GI  Procedures:    Antimicrobials: Anti-infectives (From admission, onward)    None       Subjective: Reported increased sob and wheezing this AM, improved greatly after neb tx this AM. Currently speaking in full sentences, no longer sob  Objective: Vitals:   05/09/21 1928 05/09/21 2047 05/10/21 0346 05/10/21 0857  BP:  (!) 139/54 (!) 147/73   Pulse:  98 68   Resp:  20 20   Temp:  98.5 F (36.9 C) 98.2 F (36.8 C)   TempSrc:  Oral Oral   SpO2: 97% 92% 94% 95%  Weight:      Height:        Intake/Output Summary (Last 24 hours) at 05/10/2021 1449 Last data filed at 05/10/2021 0600 Gross per 24 hour  Intake 113.62 ml  Output --  Net 113.62 ml   Filed Weights   05/09/21 0156  Weight: 95.3 kg    Examination: General exam: Awake, laying in bed, in nad Respiratory system: Normal respiratory effort, no wheezing Cardiovascular system: regular rate, s1, s2 Gastrointestinal system: Soft, nondistended, positive BS Central nervous system: CN2-12 grossly intact, strength intact Extremities: Perfused, no clubbing Skin: Normal skin turgor,  no notable skin lesions seen Psychiatry: Mood normal // no visual hallucinations   Data Reviewed: I have personally reviewed following labs and imaging studies  CBC: Recent Labs  Lab 05/09/21 0203 05/09/21 1520 05/10/21 0450  WBC 7.2  --  7.8  NEUTROABS 4.8  --   --   HGB 7.9* 8.5* 8.1*  HCT 28.5* 30.6* 29.2*  MCV 71.1*  --  71.2*  PLT 590*  --  665*   Basic  Metabolic Panel: Recent Labs  Lab 05/09/21 0203 05/10/21 0450  NA 142 141  K 3.8 3.5  CL 107 108  CO2 26 26  GLUCOSE 114* 108*  BUN 13 8  CREATININE 0.70 0.72  CALCIUM 9.0 8.9   GFR: Estimated Creatinine Clearance: 67.6 mL/min (by C-G formula based on SCr of 0.72 mg/dL). Liver Function Tests: Recent Labs  Lab 05/09/21 0203  AST 15  ALT 11  ALKPHOS 66  BILITOT 0.4  PROT 6.5  ALBUMIN 3.5   No results for input(s): LIPASE, AMYLASE in the last 168 hours. No results for input(s): AMMONIA in the last 168 hours. Coagulation Profile: No results for input(s): INR, PROTIME in the last 168 hours. Cardiac Enzymes: No results for input(s): CKTOTAL, CKMB, CKMBINDEX, TROPONINI in the last 168 hours. BNP (last 3 results) No results for input(s): PROBNP in the last 8760 hours. HbA1C: Recent Labs    05/09/21 1522  HGBA1C 5.8*   CBG: Recent Labs  Lab 05/09/21 0923 05/09/21 1654 05/09/21 2049 05/10/21 0821 05/10/21 1217  GLUCAP 110* 98 100* 97 123*   Lipid Profile: No results for input(s): CHOL, HDL, LDLCALC, TRIG, CHOLHDL, LDLDIRECT in the last 72 hours. Thyroid Function Tests: No results for input(s): TSH, T4TOTAL, FREET4, T3FREE, THYROIDAB in the last 72 hours. Anemia Panel: Recent Labs    05/09/21 0203  VITAMINB12 337  FOLATE 20.6  FERRITIN 6*  TIBC 430  IRON 12*  RETICCTPCT 1.4   Sepsis Labs: No results for input(s): PROCALCITON, LATICACIDVEN in the last 168 hours.  Recent Results (from the past 240 hour(s))  Resp Panel by RT-PCR (Flu A&B, Covid) Nasopharyngeal Swab     Status: None   Collection Time: 05/09/21  2:04 AM   Specimen: Nasopharyngeal Swab; Nasopharyngeal(NP) swabs in vial transport medium  Result Value Ref Range Status   SARS Coronavirus 2 by RT PCR NEGATIVE NEGATIVE Final    Comment: (NOTE) SARS-CoV-2 target nucleic acids are NOT DETECTED.  The SARS-CoV-2 RNA is generally detectable in upper respiratory specimens during the acute phase of  infection. The lowest concentration of SARS-CoV-2 viral copies this assay can detect is 138 copies/mL. A negative result does not preclude SARS-Cov-2 infection and should not be used as the sole basis for treatment or other patient management decisions. A negative result may occur with  improper specimen collection/handling, submission of specimen other than nasopharyngeal swab, presence of viral mutation(s) within the areas targeted by this assay, and inadequate number of viral copies(<138 copies/mL). A negative result must be combined with clinical observations, patient history, and epidemiological information. The expected result is Negative.  Fact Sheet for Patients:  EntrepreneurPulse.com.au  Fact Sheet for Healthcare Providers:  IncredibleEmployment.be  This test is no t yet approved or cleared by the [place] FDA and  has been authorized for detection and/or diagnosis of SARS-CoV-2 by FDA under an Emergency Use Authorization (EUA). This EUA will remain  in effect (meaning this test can be used) for the duration of the COVID-19 declaration under Section 564(b)(1) of  the Act, 21 U.S.C.section 360bbb-3(b)(1), unless the authorization is terminated  or revoked sooner.       Influenza A by PCR NEGATIVE NEGATIVE Final   Influenza B by PCR NEGATIVE NEGATIVE Final    Comment: (NOTE) The Xpert Xpress SARS-CoV-2/FLU/RSV plus assay is intended as an aid in the diagnosis of influenza from Nasopharyngeal swab specimens and should not be used as a sole basis for treatment. Nasal washings and aspirates are unacceptable for Xpert Xpress SARS-CoV-2/FLU/RSV testing.  Fact Sheet for Patients: EntrepreneurPulse.com.au  Fact Sheet for Healthcare Providers: IncredibleEmployment.be  This test is not yet approved or cleared by the [place] FDA and has been authorized for detection and/or diagnosis of SARS-CoV-2  by FDA under an Emergency Use Authorization (EUA). This EUA will remain in effect (meaning this test can be used) for the duration of the COVID-19 declaration under Section 564(b)(1) of the Act, 21 U.S.C. section 360bbb-3(b)(1), unless the authorization is terminated or revoked.  Performed at [facility], [address], [place], [place] [address]      Radiology Studies: DG Chest [facility] 1 View  Result Date: 05/09/2021 CLINICAL DATA:  Dyspnea EXAM: PORTABLE CHEST 1 VIEW COMPARISON:  04/15/2021 FINDINGS: Increasing opacity at the lung bases may be artifactual and related to semi upright positioning. The lungs are symmetrically well expanded and are clear. No pneumothorax or pleural effusion. Mild cardiomegaly is stable. Pulmonary vascularity is normal. No acute bone abnormality. IMPRESSION: No active disease.  Stable cardiomegaly Electronically Signed   By: Fidela Salisbury M.D.   On: 05/09/2021 02:37    Scheduled Meds:  amLODipine  10 mg Oral Daily   atorvastatin  80 mg Oral Daily   gabapentin  600 mg Oral BID   hydrALAZINE  100 mg Oral TID   ipratropium-albuterol  3 mL Nebulization TID   losartan  100 mg Oral Daily   metFORMIN  250 mg Oral BID WC   pantoprazole  40 mg Oral Daily   spironolactone  50 mg Oral Daily   Continuous Infusions:  lactated ringers 10 mL/hr at 05/09/21 1819     LOS: 0 days   Marylu Lund, MD [facility] Pager On Amion  If 7PM-7AM, please contact night-coverage 05/10/2021, 2:49 PM

## 2021-05-10 NOTE — Progress Notes (Signed)
Subjective: Complaining about SOB.  Objective: Vital signs in last 24 hours: Temp:  [98.2 F (36.8 C)-98.8 F (37.1 C)] 98.2 F (36.8 C) (11/17 0346) Pulse Rate:  [58-98] 68 (11/17 0346) Resp:  [16-25] 20 (11/17 0346) BP: (139-176)/(54-88) 147/73 (11/17 0346) SpO2:  [90 %-99 %] 94 % (11/17 0346) Last BM Date: 05/08/21  Intake/Output from previous day: 11/16 0701 - 11/17 0700 In: 113.6 [I.V.:113.6] Out: -  Intake/Output this shift: No intake/output data recorded.  General appearance: alert and fatigued GI: soft, non-tender; bowel sounds normal; no masses,  no organomegaly  Lab Results: Recent Labs    05/09/21 0203 05/09/21 1520 05/10/21 0450  WBC 7.2  --  7.8  HGB 7.9* 8.5* 8.1*  HCT 28.5* 30.6* 29.2*  PLT 590*  --  665*   BMET Recent Labs    05/09/21 0203 05/10/21 0450  NA 142 141  K 3.8 3.5  CL 107 108  CO2 26 26  GLUCOSE 114* 108*  BUN 13 8  CREATININE 0.70 0.72  CALCIUM 9.0 8.9   LFT Recent Labs    05/09/21 0203  PROT 6.5  ALBUMIN 3.5  AST 15  ALT 11  ALKPHOS 66  BILITOT 0.4   PT/INR No results for input(s): LABPROT, INR in the last 72 hours. Hepatitis Panel No results for input(s): HEPBSAG, HCVAB, HEPAIGM, HEPBIGM in the last 72 hours. C-Diff No results for input(s): CDIFFTOX in the last 72 hours. Fecal Lactopherrin No results for input(s): FECLLACTOFRN in the last 72 hours.  Studies/Results: DG Chest Port 1 View  Result Date: 05/09/2021 CLINICAL DATA:  Dyspnea EXAM: PORTABLE CHEST 1 VIEW COMPARISON:  04/15/2021 FINDINGS: Increasing opacity at the lung bases may be artifactual and related to semi upright positioning. The lungs are symmetrically well expanded and are clear. No pneumothorax or pleural effusion. Mild cardiomegaly is stable. Pulmonary vascularity is normal. No acute bone abnormality. IMPRESSION: No active disease.  Stable cardiomegaly Electronically Signed   By: Fidela Salisbury M.D.   On: 05/09/2021 02:37    Medications:  Scheduled:  amLODipine  10 mg Oral Daily   atorvastatin  80 mg Oral Daily   gabapentin  600 mg Oral BID   hydrALAZINE  100 mg Oral TID   ipratropium-albuterol  3 mL Nebulization TID   losartan  100 mg Oral Daily   metFORMIN  250 mg Oral BID WC   pantoprazole (PROTONIX) IV  40 mg Intravenous Q12H   spironolactone  50 mg Oral Daily   Continuous:  lactated ringers 10 mL/hr at 05/09/21 1819    Assessment/Plan: 1) SOB. 2) COPD. 3) Anemia. 4) History of small bowel AVMs.   The patient is stable, bu she is SOB.  Her oxygen saturation in the upper 90's on RA, but she is dyspneic.  She is unable to speak in a complete sentence and she feels as if she is struggling to breath.  The patient does not feel that using her inhalers helps.  Her HGB is stable.  Plan: 1) Optimize respiratory status. 2) No need for IV pantoprazole.  D/C. 3) Follow HGB and transfuse as necessary. 4) Hold on any GI intervention for now.  Her respiratory status is concerning.  LOS: 0 days   Laylamarie Meuser D 05/10/2021, 7:40 AM

## 2021-05-10 NOTE — H&P (View-Only) (Signed)
Subjective: Complaining about SOB.  Objective: Vital signs in last 24 hours: Temp:  [98.2 F (36.8 C)-98.8 F (37.1 C)] 98.2 F (36.8 C) (11/17 0346) Pulse Rate:  [58-98] 68 (11/17 0346) Resp:  [16-25] 20 (11/17 0346) BP: (139-176)/(54-88) 147/73 (11/17 0346) SpO2:  [90 %-99 %] 94 % (11/17 0346) Last BM Date: 05/08/21  Intake/Output from previous day: 11/16 0701 - 11/17 0700 In: 113.6 [I.V.:113.6] Out: -  Intake/Output this shift: No intake/output data recorded.  General appearance: alert and fatigued GI: soft, non-tender; bowel sounds normal; no masses,  no organomegaly  Lab Results: Recent Labs    05/09/21 0203 05/09/21 1520 05/10/21 0450  WBC 7.2  --  7.8  HGB 7.9* 8.5* 8.1*  HCT 28.5* 30.6* 29.2*  PLT 590*  --  665*   BMET Recent Labs    05/09/21 0203 05/10/21 0450  NA 142 141  K 3.8 3.5  CL 107 108  CO2 26 26  GLUCOSE 114* 108*  BUN 13 8  CREATININE 0.70 0.72  CALCIUM 9.0 8.9   LFT Recent Labs    05/09/21 0203  PROT 6.5  ALBUMIN 3.5  AST 15  ALT 11  ALKPHOS 66  BILITOT 0.4   PT/INR No results for input(s): LABPROT, INR in the last 72 hours. Hepatitis Panel No results for input(s): HEPBSAG, HCVAB, HEPAIGM, HEPBIGM in the last 72 hours. C-Diff No results for input(s): CDIFFTOX in the last 72 hours. Fecal Lactopherrin No results for input(s): FECLLACTOFRN in the last 72 hours.  Studies/Results: DG Chest Port 1 View  Result Date: 05/09/2021 CLINICAL DATA:  Dyspnea EXAM: PORTABLE CHEST 1 VIEW COMPARISON:  04/15/2021 FINDINGS: Increasing opacity at the lung bases may be artifactual and related to semi upright positioning. The lungs are symmetrically well expanded and are clear. No pneumothorax or pleural effusion. Mild cardiomegaly is stable. Pulmonary vascularity is normal. No acute bone abnormality. IMPRESSION: No active disease.  Stable cardiomegaly Electronically Signed   By: Fidela Salisbury M.D.   On: 05/09/2021 02:37    Medications:  Scheduled:  amLODipine  10 mg Oral Daily   atorvastatin  80 mg Oral Daily   gabapentin  600 mg Oral BID   hydrALAZINE  100 mg Oral TID   ipratropium-albuterol  3 mL Nebulization TID   losartan  100 mg Oral Daily   metFORMIN  250 mg Oral BID WC   pantoprazole (PROTONIX) IV  40 mg Intravenous Q12H   spironolactone  50 mg Oral Daily   Continuous:  lactated ringers 10 mL/hr at 05/09/21 1819    Assessment/Plan: 1) SOB. 2) COPD. 3) Anemia. 4) History of small bowel AVMs.   The patient is stable, bu she is SOB.  Her oxygen saturation in the upper 90's on RA, but she is dyspneic.  She is unable to speak in a complete sentence and she feels as if she is struggling to breath.  The patient does not feel that using her inhalers helps.  Her HGB is stable.  Plan: 1) Optimize respiratory status. 2) No need for IV pantoprazole.  D/C. 3) Follow HGB and transfuse as necessary. 4) Hold on any GI intervention for now.  Her respiratory status is concerning.  LOS: 0 days   Marquie Aderhold D 05/10/2021, 7:40 AM

## 2021-05-11 ENCOUNTER — Inpatient Hospital Stay (HOSPITAL_COMMUNITY): Payer: Medicare Other | Admitting: Registered Nurse

## 2021-05-11 ENCOUNTER — Encounter (HOSPITAL_COMMUNITY)
Admission: EM | Disposition: A | Discharge status: Home / Self Care | Payer: Self-pay | Source: Home / Self Care | Attending: Internal Medicine

## 2021-05-11 DIAGNOSIS — J441 Chronic obstructive pulmonary disease with (acute) exacerbation: Secondary | ICD-10-CM | POA: Diagnosis not present

## 2021-05-11 DIAGNOSIS — R195 Other fecal abnormalities: Secondary | ICD-10-CM | POA: Diagnosis not present

## 2021-05-11 HISTORY — PX: ENTEROSCOPY: SHX5533

## 2021-05-11 HISTORY — PX: HOT HEMOSTASIS: SHX5433

## 2021-05-11 HISTORY — PX: HEMOSTASIS CLIP PLACEMENT: SHX6857

## 2021-05-11 LAB — GLUCOSE, CAPILLARY
Glucose-Capillary: 100 mg/dL — ABNORMAL HIGH (ref 70–99)
Glucose-Capillary: 91 mg/dL (ref 70–99)
Glucose-Capillary: 120 mg/dL — ABNORMAL HIGH (ref 70–99)
Glucose-Capillary: 144 mg/dL — ABNORMAL HIGH (ref 70–99)

## 2021-05-11 SURGERY — ENTEROSCOPY
Anesthesia: Monitor Anesthesia Care

## 2021-05-11 MED ORDER — LIDOCAINE 2% (20 MG/ML) 5 ML SYRINGE
INTRAMUSCULAR | Status: DC | PRN
  Administered 2021-05-11: 40 mg via INTRAVENOUS

## 2021-05-11 MED ORDER — PREDNISONE 20 MG PO TABS
40.0000 mg | ORAL_TABLET | Freq: Once | ORAL | Status: AC
  Administered 2021-05-11: 40 mg via ORAL
  Filled 2021-05-11: qty 2

## 2021-05-11 MED ORDER — ASPIRIN EC 81 MG PO TBEC
81.0000 mg | DELAYED_RELEASE_TABLET | Freq: Every day | ORAL | Status: DC
  Administered 2021-05-12: 81 mg via ORAL
  Filled 2021-05-11: qty 1

## 2021-05-11 MED ORDER — PROPOFOL 1000 MG/100ML IV EMUL
INTRAVENOUS | Status: AC
  Filled 2021-05-11: qty 100

## 2021-05-11 MED ORDER — PROPOFOL 10 MG/ML IV BOLUS
INTRAVENOUS | Status: DC | PRN
  Administered 2021-05-11: 20 mg via INTRAVENOUS

## 2021-05-11 MED ORDER — PROPOFOL 500 MG/50ML IV EMUL
INTRAVENOUS | Status: DC | PRN
  Administered 2021-05-11: 120 ug/kg/min via INTRAVENOUS

## 2021-05-11 MED ORDER — LACTATED RINGERS IV SOLN: INTRAVENOUS | Status: DC

## 2021-05-11 MED ORDER — SODIUM CHLORIDE 0.9 % IV SOLN: INTRAVENOUS | Status: DC

## 2021-05-11 NOTE — Progress Notes (Signed)
PROGRESS NOTE    [name]  QQP:[id number] DOB: [birth year] DOA: 05/09/2021 PCP: Benito Mccreedy, MD    Brief Narrative:  70 y.o. female with medical history significant of iron deficiency anemia, anxiety, depression, osteoarthritis, resolved chronic pain syndrome, COPD, CAD, type II DM, GERD, hyperlipidemia, hypertension, sleep apnea not on CPAP who is coming to the emergency department due to progressively worse dyspnea for almost 2 weeks associated with significant fatigue.  The patient stated that he is unable to do her ADL without getting short of breath.  She denied fever, chills, night sweats, but states she had rhinorrhea and sore throat initially.  No sick contacts.  Denied chest pain, palpitations, diaphoresis, PND, orthopnea or pitting edema of the lower extremities.  She has had epigastric pain, but denied nausea, emesis, diarrhea, constipation, melena or hematochezia.  She stated her stool color is normal.  No dysuria, frequency or hematuria.  No polyuria, polydipsia, polyphagia or blurred vision.  Assessment & Plan:   Principal Problem:   GI bleeding Active Problems:   HTN (hypertension)   Type 2 diabetes mellitus with peripheral vascular disease (HCC)   Iron deficiency anemia   Hyperlipemia   COPD (chronic obstructive pulmonary disease) (HCC)   Coronary artery disease   COPD exacerbation (HCC)   COPD exacerbation Recently noted to have audible wheezing and inability to speak in full sentences, now improved with neb tx Pt has since received neb tx with notable improvement. Pt currently w/o wheezing and able to speak in full sentences Would cont with scheduled and PRN nebs for now. Reports subjective sob on moderate exertion ambulating to bathroom Will give trial of one dose of prednisone Pt reports frequent bouts of COPD exertion prior to admit and requests home neb unit. Will place DME order for neb Recommend outpt referral to Pulmonary Currently on  minimal O2 support HTN BP stable Cont home norvasc, losartan, hydralazine, spironolactone as tolerated DM2 Metformin was resumed at time of admit Continue on SSI Iron deficiency anemia with possible acute blood loss anemia Hgb stable thus far GI following. Now s/p EGD with bleeding duodenal angiodysplasia and nonbleeding jejunal angiodysplastic lesions both treated Discussed with GI. OK to resume ASA HLD Continued on high dose atorvastatin Presenting LFT's within normal limits CAD Denies chest pains OK to resume ASA on discussion with GI  DVT prophylaxis: SCD's Code Status: Full Family Communication: Pt in room, family not at bedside  Status is: inpatient  The patient will require care spanning > 2 midnights and should be moved to inpatient because: severity of illness   Consultants:  GI  Procedures:    Antimicrobials: Anti-infectives (From admission, onward)    None       Subjective: Reports breathing better today than yesterday AM  Objective: Vitals:   05/11/21 1050 05/11/21 1100 05/11/21 1111 05/11/21 1153  BP: 120/67 (!) 137/58 (!) 162/59 (!) 154/66  Pulse: 91 85 73 65  Resp: (!) 30 (!) 28 (!) 30 (!) 22  Temp: 97.8 F (36.6 C)   98.5 F (36.9 C)  TempSrc: Oral     SpO2: 92% 96% 97% 99%  Weight:      Height:        Intake/Output Summary (Last 24 hours) at 05/11/2021 1242 Last data filed at 05/11/2021 1052 Gross per 24 hour  Intake 274.61 ml  Output --  Net 274.61 ml    Filed Weights   05/09/21 0156  Weight: 95.3 kg    Examination: General exam: Conversant, in  no acute distress Respiratory system: normal chest rise, clear, no audible wheezing Cardiovascular system: regular rhythm, s1-s2 Gastrointestinal system: Nondistended, nontender, pos BS Central nervous system: No seizures, no tremors Extremities: No cyanosis, no joint deformities Skin: No rashes, no pallor Psychiatry: Affect normal // no auditory hallucinations   Data Reviewed:  I have personally reviewed following labs and imaging studies  CBC: Recent Labs  Lab 05/09/21 0203 05/09/21 1520 05/10/21 0450  WBC 7.2  --  7.8  NEUTROABS 4.8  --   --   HGB 7.9* 8.5* 8.1*  HCT 28.5* 30.6* 29.2*  MCV 71.1*  --  71.2*  PLT 590*  --  665*    Basic Metabolic Panel: Recent Labs  Lab 05/09/21 0203 05/10/21 0450  NA 142 141  K 3.8 3.5  CL 107 108  CO2 26 26  GLUCOSE 114* 108*  BUN 13 8  CREATININE 0.70 0.72  CALCIUM 9.0 8.9    GFR: Estimated Creatinine Clearance: 67.6 mL/min (by C-G formula based on SCr of 0.72 mg/dL). Liver Function Tests: Recent Labs  Lab 05/09/21 0203  AST 15  ALT 11  ALKPHOS 66  BILITOT 0.4  PROT 6.5  ALBUMIN 3.5    No results for input(s): LIPASE, AMYLASE in the last 168 hours. No results for input(s): AMMONIA in the last 168 hours. Coagulation Profile: No results for input(s): INR, PROTIME in the last 168 hours. Cardiac Enzymes: No results for input(s): CKTOTAL, CKMB, CKMBINDEX, TROPONINI in the last 168 hours. BNP (last 3 results) No results for input(s): PROBNP in the last 8760 hours. HbA1C: Recent Labs    05/09/21 1522  HGBA1C 5.8*    CBG: Recent Labs  Lab 05/10/21 0821 05/10/21 1217 05/10/21 1641 05/11/21 0811 05/11/21 1142  GLUCAP 97 123* 137* 100* 91    Lipid Profile: No results for input(s): CHOL, HDL, LDLCALC, TRIG, CHOLHDL, LDLDIRECT in the last 72 hours. Thyroid Function Tests: No results for input(s): TSH, T4TOTAL, FREET4, T3FREE, THYROIDAB in the last 72 hours. Anemia Panel: Recent Labs    05/09/21 0203  VITAMINB12 337  FOLATE 20.6  FERRITIN 6*  TIBC 430  IRON 12*  RETICCTPCT 1.4    Sepsis Labs: No results for input(s): PROCALCITON, LATICACIDVEN in the last 168 hours.  Recent Results (from the past 240 hour(s))  Resp Panel by RT-PCR (Flu A&B, Covid) Nasopharyngeal Swab     Status: None   Collection Time: 05/09/21  2:04 AM   Specimen: Nasopharyngeal Swab; Nasopharyngeal(NP) swabs  in vial transport medium  Result Value Ref Range Status   SARS Coronavirus 2 by RT PCR NEGATIVE NEGATIVE Final    Comment: (NOTE) SARS-CoV-2 target nucleic acids are NOT DETECTED.  The SARS-CoV-2 RNA is generally detectable in upper respiratory specimens during the acute phase of infection. The lowest concentration of SARS-CoV-2 viral copies this assay can detect is 138 copies/mL. A negative result does not preclude SARS-Cov-2 infection and should not be used as the sole basis for treatment or other patient management decisions. A negative result may occur with  improper specimen collection/handling, submission of specimen other than nasopharyngeal swab, presence of viral mutation(s) within the areas targeted by this assay, and inadequate number of viral copies(<138 copies/mL). A negative result must be combined with clinical observations, patient history, and epidemiological information. The expected result is Negative.  Fact Sheet for Patients:  EntrepreneurPulse.com.au  Fact Sheet for Healthcare Providers:  IncredibleEmployment.be  This test is no t yet approved or cleared by the [place] and  has been authorized for detection and/or diagnosis of SARS-CoV-2 by FDA under an Emergency Use Authorization (EUA). This EUA will remain  in effect (meaning this test can be used) for the duration of the COVID-19 declaration under Section 564(b)(1) of the Act, 21 U.S.C.section 360bbb-3(b)(1), unless the authorization is terminated  or revoked sooner.       Influenza A by PCR NEGATIVE NEGATIVE Final   Influenza B by PCR NEGATIVE NEGATIVE Final    Comment: (NOTE) The Xpert Xpress SARS-CoV-2/FLU/RSV plus assay is intended as an aid in the diagnosis of influenza from Nasopharyngeal swab specimens and should not be used as a sole basis for treatment. Nasal washings and aspirates are unacceptable for Xpert Xpress  SARS-CoV-2/FLU/RSV testing.  Fact Sheet for Patients: EntrepreneurPulse.com.au  Fact Sheet for Healthcare Providers: IncredibleEmployment.be  This test is not yet approved or cleared by the [place] FDA and has been authorized for detection and/or diagnosis of SARS-CoV-2 by FDA under an Emergency Use Authorization (EUA). This EUA will remain in effect (meaning this test can be used) for the duration of the COVID-19 declaration under Section 564(b)(1) of the Act, 21 U.S.C. section 360bbb-3(b)(1), unless the authorization is terminated or revoked.  Performed at [facility], [address], [place],  [address]       Radiology Studies: No results found.  Scheduled Meds:  amLODipine  10 mg Oral Daily   atorvastatin  80 mg Oral Daily   gabapentin  600 mg Oral BID   hydrALAZINE  100 mg Oral TID   insulin aspart  0-15 Units Subcutaneous TID WC   insulin aspart  0-5 Units Subcutaneous QHS   ipratropium-albuterol  3 mL Nebulization TID   losartan  100 mg Oral Daily   metFORMIN  250 mg Oral BID WC   pantoprazole  40 mg Oral Daily   spironolactone  50 mg Oral Daily   Continuous Infusions:     LOS: 1 day   Marylu Lund, MD [facility] Pager On Amion  If 7PM-7AM, please contact night-coverage 05/11/2021, 12:42 PM

## 2021-05-11 NOTE — Anesthesia Postprocedure Evaluation (Signed)
Anesthesia Post Note  Patient: [name]  Procedure(s) Performed: ENTEROSCOPY HOT HEMOSTASIS (ARGON PLASMA COAGULATION/BICAP) HEMOSTASIS CLIP PLACEMENT     Patient location during evaluation: Endoscopy Anesthesia Type: MAC Level of consciousness: awake and alert Pain management: pain level controlled Vital Signs Assessment: post-procedure vital signs reviewed and stable Respiratory status: spontaneous breathing, nonlabored ventilation and respiratory function stable Cardiovascular status: blood pressure returned to baseline and stable Postop Assessment: no apparent nausea or vomiting Anesthetic complications: no   No notable events documented.  Last Vitals:  Vitals:   05/11/21 1100 05/11/21 1111  BP: (!) 137/58 (!) 162/59  Pulse: 85 73  Resp: (!) 28 (!) 30  Temp:    SpO2: 96% 97%    Last Pain:  Vitals:   05/11/21 1111  TempSrc:   PainSc: 0-No pain                 Lidia Collum

## 2021-05-11 NOTE — Transfer of Care (Signed)
Immediate Anesthesia Transfer of Care Note  Patient: [name]  Procedure(s) Performed: ENTEROSCOPY HOT HEMOSTASIS (ARGON PLASMA COAGULATION/BICAP) HEMOSTASIS CLIP PLACEMENT  Patient Location: Endoscopy Unit  Anesthesia Type:MAC  Level of Consciousness: awake  Airway & Oxygen Therapy: Patient Spontanous Breathing and Patient connected to face mask oxygen  Post-op Assessment: Report given to RN and Post -op Vital signs reviewed and stable  Post vital signs: Reviewed and stable  Last Vitals:  Vitals Value Taken Time  BP 120/67 05/11/21 1050  Temp 36.6 C 05/11/21 1050  Pulse 90 05/11/21 1053  Resp 36 05/11/21 1053  SpO2 94 % 05/11/21 1053  Vitals shown include unvalidated device data.  Last Pain:  Vitals:   05/11/21 1050  TempSrc: Oral  PainSc: 0-No pain      Patients Stated Pain Goal: 0 (70/48/88 9169)  Complications: No notable events documented.

## 2021-05-11 NOTE — Interval H&P Note (Signed)
History and Physical Interval Note:  05/11/2021 10:03 AM  [name]  has presented today for surgery, with the diagnosis of Anemia.  The various methods of treatment have been discussed with the patient and family. After consideration of risks, benefits and other options for treatment, the patient has consented to  Procedure(s): ENTEROSCOPY (N/A) as a surgical intervention.  The patient's history has been reviewed, patient examined, no change in status, stable for surgery.  I have reviewed the patient's chart and labs.  Questions were answered to the patient's satisfaction.     [name]

## 2021-05-11 NOTE — Anesthesia Preprocedure Evaluation (Signed)
Anesthesia Evaluation  Patient identified by MRN, date of birth, ID band Patient awake    Reviewed: Allergy & Precautions, NPO status , Patient's Chart, lab work & pertinent test results  History of Anesthesia Complications Negative for: history of anesthetic complications  Airway Mallampati: II  TM Distance: >3 FB Neck ROM: Full    Dental  (+) Teeth Intact   Pulmonary sleep apnea , COPD, former smoker,    Pulmonary exam normal        Cardiovascular hypertension, Pt. on medications + CAD and + Peripheral Vascular Disease  Normal cardiovascular exam   Echo 2013: EF 55-65%, no RWMA, nl diastolic fn, AV sclerosis w/o stenosis, increased RVSP, PASP 31   Neuro/Psych negative neurological ROS     GI/Hepatic Neg liver ROS, GERD  Medicated,  Endo/Other  diabetes, Type 2, Oral Hypoglycemic AgentsMorbid obesity  Renal/GU negative Renal ROS  negative genitourinary   Musculoskeletal  (+) Arthritis ,   Abdominal   Peds  Hematology  (+) Blood dyscrasia (Hgb 8.1), anemia ,   Anesthesia Other Findings   Reproductive/Obstetrics                             Anesthesia Physical Anesthesia Plan  ASA: 3  Anesthesia Plan: MAC   Post-op Pain Management:    Induction: Intravenous  PONV Risk Score and Plan: 2 and Propofol infusion, TIVA and Treatment may vary due to age or medical condition  Airway Management Planned: Natural Airway, Nasal Cannula and Simple Face Mask  Additional Equipment: None  Intra-op Plan:   Post-operative Plan:   Informed Consent: I have reviewed the patients History and Physical, chart, labs and discussed the procedure including the risks, benefits and alternatives for the proposed anesthesia with the patient or authorized representative who has indicated his/her understanding and acceptance.       Plan Discussed with:   Anesthesia Plan Comments:          Anesthesia Quick Evaluation

## 2021-05-11 NOTE — Op Note (Signed)
[facility] Patient Name: [name] Procedure Date: 05/11/2021 MRN: [id number] Attending MD: Carol Ada , MD Date of Birth: 06-Oct-1950 CSN: [id number] Age: 70 Admit Type: Inpatient Procedure:                Small bowel enteroscopy Indications:              Arteriovenous malformation in the small intestine Providers:                Carol Ada, MD, Doristine Johns, RN, Lodema Hong Technician, Technician, Cletis Athens, Technician Referring MD:              Medicines:                Propofol per Anesthesia Complications:            No immediate complications. Estimated Blood Loss:     Estimated blood loss: none. Procedure:                Pre-Anesthesia Assessment:                           - Prior to the procedure, a History and Physical                            was performed, and patient medications and                            allergies were reviewed. The patient's tolerance of                            previous anesthesia was also reviewed. The risks                            and benefits of the procedure and the sedation                            options and risks were discussed with the patient.                            All questions were answered, and informed consent                            was obtained. Prior Anticoagulants: The patient has                            taken no previous anticoagulant or antiplatelet                            agents. ASA Grade Assessment: III - A patient with                            severe systemic disease. After reviewing the risks  and benefits, the patient was deemed in                            satisfactory condition to undergo the procedure.                           - Sedation was administered by an anesthesia                            professional. Deep sedation was attained.                           After obtaining informed consent, the endoscope  was                            passed under direct vision. Throughout the                            procedure, the patient's blood pressure, pulse, and                            oxygen saturations were monitored continuously. The                            [id number]-HQ190L ([id number]) Olympus colonoscope was                            introduced through the mouth and advanced to the                            proximal jejunum. The small bowel enteroscopy was                            accomplished without difficulty. The patient                            tolerated the procedure well. Scope In: Scope Out: Findings:      The esophagus was normal.      The stomach was normal.      A few angiodysplastic lesions with bleeding were found in the second       portion of the duodenum and in the third portion of the duodenum.       Coagulation for hemostasis using monopolar probe was successful. To stop       active bleeding, three hemostatic clips were successfully placed (MR       conditional). There was no bleeding at the end of the procedure.      A few angiodysplastic lesions with no bleeding were found in the       proximal jejunum. Coagulation for tissue destruction using monopolar       probe was successful. Estimated blood loss: none. Impression:               - Normal esophagus.                           - Normal stomach.                           -  A few bleeding angiodysplastic lesions in the                            duodenum. Treated with a monopolar probe. Clips (MR                            conditional) were placed.                           - A few non-bleeding angiodysplastic lesions in the                            jejunum. Treated with a monopolar probe.                           - No specimens collected. Recommendation:           - Return patient to hospital ward for ongoing care.                           - Resume regular diet.                           - Return to GI clinic  in 4 weeks. Procedure Code(s):        --- Professional ---                           [phone], Small intestinal endoscopy, enteroscopy                            beyond second portion of duodenum, not including                            ileum; with ablation of tumor(s), polyp(s), or                            other lesion(s) not amenable to removal by hot                            biopsy forceps, bipolar cautery or snare technique                           [id number], 37, Small intestinal endoscopy, enteroscopy                            beyond second portion of duodenum, not including                            ileum; with control of bleeding (eg, injection,                            bipolar cautery, unipolar cautery, laser, heater                            probe, stapler, plasma coagulator) Diagnosis Code(s):        ---  Professional ---                           C78.938, Angiodysplasia of stomach and duodenum                            with bleeding                           K55.20, Angiodysplasia of colon without hemorrhage CPT copyright 2019 American Medical Association. All rights reserved. The codes documented in this report are preliminary and upon coder review may  be revised to meet current compliance requirements. Carol Ada, MD Carol Ada, MD 05/11/2021 10:50:26 AM This report has been signed electronically. Number of Addenda: 0

## 2021-05-12 DIAGNOSIS — R195 Other fecal abnormalities: Secondary | ICD-10-CM | POA: Diagnosis not present

## 2021-05-12 DIAGNOSIS — J441 Chronic obstructive pulmonary disease with (acute) exacerbation: Secondary | ICD-10-CM | POA: Diagnosis not present

## 2021-05-12 LAB — CBC
HCT: 30.4 % — ABNORMAL LOW (ref 36.0–46.0)
Hemoglobin: 8.3 g/dL — ABNORMAL LOW (ref 12.0–15.0)
MCH: 19.8 pg — ABNORMAL LOW (ref 26.0–34.0)
MCHC: 27.3 g/dL — ABNORMAL LOW (ref 30.0–36.0)
MCV: 72.6 fL — ABNORMAL LOW (ref 80.0–100.0)
Platelets: 586 10*3/uL — ABNORMAL HIGH (ref 150–400)
RBC: 4.19 MIL/uL (ref 3.87–5.11)
RDW: 19.6 % — ABNORMAL HIGH (ref 11.5–15.5)
WBC: 8.8 10*3/uL (ref 4.0–10.5)
nRBC: 0.2 % (ref 0.0–0.2)

## 2021-05-12 LAB — GLUCOSE, CAPILLARY
Glucose-Capillary: 144 mg/dL — ABNORMAL HIGH (ref 70–99)
Glucose-Capillary: 160 mg/dL — ABNORMAL HIGH (ref 70–99)

## 2021-05-12 MED ORDER — PREDNISONE 10 MG PO TABS: ORAL_TABLET | ORAL | 0 refills | Status: AC

## 2021-05-12 MED ORDER — PREDNISONE 20 MG PO TABS
40.0000 mg | ORAL_TABLET | Freq: Once | ORAL | Status: AC
  Administered 2021-05-12: 40 mg via ORAL
  Filled 2021-05-12: qty 2

## 2021-05-12 MED ORDER — IPRATROPIUM-ALBUTEROL 0.5-2.5 (3) MG/3ML IN SOLN
3.0000 mL | Freq: Three times a day (TID) | RESPIRATORY_TRACT | 0 refills | Status: DC
Start: 2021-05-12 — End: 2021-11-13

## 2021-05-12 NOTE — Discharge Summary (Signed)
Physician Discharge Summary  [name] MLY:[id number] DOB: 1951-02-23 DOA: 05/09/2021  PCP: Benito Mccreedy, MD  Admit date: 05/09/2021 Discharge date: 05/12/2021  Admitted From: Home Disposition:  Home  Recommendations for Outpatient Follow-up:  Follow up with PCP in 1-2 weeks Consider referral to Pulmonary as outpatient  Discharge Condition:Stable CODE STATUS:Full Diet recommendation: Diabetic   Brief/Interim Summary: 70 y.o. female with medical history significant of iron deficiency anemia, anxiety, depression, osteoarthritis, resolved chronic pain syndrome, COPD, CAD, type II DM, GERD, hyperlipidemia, hypertension, sleep apnea not on CPAP who is coming to the emergency department due to progressively worse dyspnea for almost 2 weeks associated with significant fatigue.  The patient stated that he is unable to do her ADL without getting short of breath.  She denied fever, chills, night sweats, but states she had rhinorrhea and sore throat initially.  No sick contacts.  Denied chest pain, palpitations, diaphoresis, PND, orthopnea or pitting edema of the lower extremities.  She has had epigastric pain, but denied nausea, emesis, diarrhea, constipation, melena or hematochezia.  She stated her stool color is normal.  No dysuria, frequency or hematuria.  No polyuria, polydipsia, polyphagia or blurred vision.  Discharge Diagnoses:  Principal Problem:   GI bleeding Active Problems:   HTN (hypertension)   Type 2 diabetes mellitus with peripheral vascular disease (HCC)   Iron deficiency anemia   Hyperlipemia   COPD (chronic obstructive pulmonary disease) (HCC)   Coronary artery disease   COPD exacerbation (HCC)    COPD exacerbation Recently noted to have audible wheezing and inability to speak in full sentences, now improved with neb tx Pt has since received neb tx with notable improvement. Pt currently w/o wheezing and able to speak in full sentences Improved with  scheduled and PRN nebs  Prescribed course of prednisone taper Pt reports frequent bouts of COPD exertion prior to admit and requests home neb unit.  Have placed DME order for neb Recommend outpt referral to Pulmonary Currently on minimal O2 support, able to ambulate on room air, maintaining O2 sats HTN BP stable Cont home norvasc, losartan, hydralazine, spironolactone as tolerated DM2 Metformin was resumed at time of admit Continue on SSI while in hospital Iron deficiency anemia with possible acute blood loss anemia Hgb stable thus far GI following. Now s/p EGD with bleeding duodenal angiodysplasia and nonbleeding jejunal angiodysplastic lesions both treated Discussed with GI. OK to resume ASA HLD Continued on high dose atorvastatin Presenting LFT's within normal limits CAD Denies chest pains OK to resume ASA on discussion with GI    Discharge Instructions   Allergies as of 05/12/2021   No Known Allergies      Medication List     STOP taking these medications    doxycycline 100 MG capsule Commonly known as: VIBRAMYCIN       TAKE these medications    albuterol 108 (90 Base) MCG/ACT inhaler Commonly known as: VENTOLIN HFA Inhale 1-2 puffs into the lungs every 6 (six) hours as needed for wheezing or shortness of breath.   amLODipine 10 MG tablet Commonly known as: NORVASC Take 10 mg by mouth daily.   Aspirin Low Dose 81 MG EC tablet Generic drug: aspirin TAKE 1 TABLET(81 MG) BY MOUTH DAILY   atorvastatin 80 MG tablet Commonly known as: LIPITOR Take 80 mg by mouth daily.   ferrous sulfate 325 (65 FE) MG tablet Take 325 mg by mouth daily.   gabapentin 600 MG tablet Commonly known as: NEURONTIN Take 1 tablet (600 mg  total) by mouth at bedtime. What changed: when to take this   hydrALAZINE 100 MG tablet Commonly known as: APRESOLINE TAKE 1 TABLET BY MOUTH THREE TIMES DAILY   ipratropium-albuterol 0.5-2.5 (3) MG/3ML Soln Commonly known as: DUONEB Take  3 mLs by nebulization 3 (three) times daily.   losartan 100 MG tablet Commonly known as: COZAAR Take 100 mg by mouth daily.   metFORMIN 500 MG tablet Commonly known as: GLUCOPHAGE Take 0.5 tablets (250 mg total) by mouth 2 (two) times daily with a meal.   nitroGLYCERIN 0.4 MG SL tablet Commonly known as: NITROSTAT Place 1 tablet (0.4 mg total) under the tongue every 5 (five) minutes as needed for chest pain.   pantoprazole 40 MG tablet Commonly known as: PROTONIX Take 40 mg by mouth daily.   predniSONE 10 MG tablet Commonly known as: DELTASONE Take 4 tablets (40 mg total) by mouth daily with breakfast for 2 days, THEN 2 tablets (20 mg total) daily with breakfast for 2 days, THEN 1 tablet (10 mg total) daily with breakfast for 2 days, THEN 0.5 tablets (5 mg total) daily with breakfast for 2 days. Start taking on: May 12, 2021 What changed:  medication strength See the new instructions.   spironolactone 50 MG tablet Commonly known as: ALDACTONE TAKE 1 TABLET(50 MG) BY MOUTH DAILY               Durable Medical Equipment  (From admission, onward)           Start     Ordered   05/11/21 1327  For home use only DME Nebulizer machine  Once       Question Answer Comment  Patient needs a nebulizer to treat with the following condition COPD (chronic obstructive pulmonary disease) ([place])   Length of Need 6 Months      05/11/21 1326            Follow-up Information     Benito Mccreedy, MD Follow up.   Specialty: Internal Medicine Why: Hospital follow up Contact information: [address] [place] [address] [phone]                No Known Allergies  Consultations: GI  Procedures/Studies: DG Chest Port 1 View  Result Date: 05/09/2021 CLINICAL DATA:  Dyspnea EXAM: PORTABLE CHEST 1 VIEW COMPARISON:  04/15/2021 FINDINGS: Increasing opacity at the lung bases may be artifactual and related to semi upright positioning. The  lungs are symmetrically well expanded and are clear. No pneumothorax or pleural effusion. Mild cardiomegaly is stable. Pulmonary vascularity is normal. No acute bone abnormality. IMPRESSION: No active disease.  Stable cardiomegaly Electronically Signed   By: Fidela Salisbury M.D.   On: 05/09/2021 02:37   DG Chest Portable 1 View  Result Date: 04/15/2021 CLINICAL DATA:  70 year old female with shortness of breath, congestion. COPD. Tachypnea. Testing for COVID-19. In progress. EXAM: PORTABLE CHEST 1 VIEW COMPARISON:  Chest radiographs 07/04/2019 and earlier. FINDINGS: Portable AP upright view at 0704 hours. Stable lung volumes and cardiomegaly. Visualized tracheal air column is within normal limits. No pneumothorax or pulmonary edema. No pleural effusion. Streaky opacity along the right minor fissure in the upper lobe is new from last year. Lung bases appear stable. No acute osseous abnormality identified. Paucity of bowel gas in the upper abdomen. IMPRESSION: 1. Streaky new opacity in the right upper lobe along the minor fissure, suspicious for acute infectious exacerbation in this setting. 2. No pleural effusion. Stable cardiomegaly.  Electronically Signed   By: Genevie Ann M.D.   On: 04/15/2021 07:22    Subjective: Eager to go home  Discharge Exam: Vitals:   05/12/21 0853 05/12/21 1057  BP: (!) 180/66 (!) 157/69  Pulse: 75 96  Resp: 18 19  Temp: 98.2 F (36.8 C) 98.3 F (36.8 C)  SpO2: 96% 91%   Vitals:   05/12/21 0556 05/12/21 0807 05/12/21 0853 05/12/21 1057  BP: (!) 165/78  (!) 180/66 (!) 157/69  Pulse: 64  75 96  Resp: 16  18 19   Temp: (!) 97.5 F (36.4 C)  98.2 F (36.8 C) 98.3 F (36.8 C)  TempSrc: Oral  Oral Oral  SpO2: 98% 91% 96% 91%  Weight:      Height:        General: Pt is alert, awake, not in acute distress Cardiovascular: RRR, S1/S2  Respiratory: CTA bilaterally, no wheezing, no rhonchi Abdominal: Soft, NT, ND, bowel sounds + Extremities: no edema, no  cyanosis   The results of significant diagnostics from this hospitalization (including imaging, microbiology, ancillary and laboratory) are listed below for reference.     Microbiology: Recent Results (from the past 240 hour(s))  Resp Panel by RT-PCR (Flu A&B, Covid) Nasopharyngeal Swab     Status: None   Collection Time: 05/09/21  2:04 AM   Specimen: Nasopharyngeal Swab; Nasopharyngeal(NP) swabs in vial transport medium  Result Value Ref Range Status   SARS Coronavirus 2 by RT PCR NEGATIVE NEGATIVE Final    Comment: (NOTE) SARS-CoV-2 target nucleic acids are NOT DETECTED.  The SARS-CoV-2 RNA is generally detectable in upper respiratory specimens during the acute phase of infection. The lowest concentration of SARS-CoV-2 viral copies this assay can detect is 138 copies/mL. A negative result does not preclude SARS-Cov-2 infection and should not be used as the sole basis for treatment or other patient management decisions. A negative result may occur with  improper specimen collection/handling, submission of specimen other than nasopharyngeal swab, presence of viral mutation(s) within the areas targeted by this assay, and inadequate number of viral copies(<138 copies/mL). A negative result must be combined with clinical observations, patient history, and epidemiological information. The expected result is Negative.  Fact Sheet for Patients:  EntrepreneurPulse.com.au  Fact Sheet for Healthcare Providers:  IncredibleEmployment.be  This test is no t yet approved or cleared by the [place] FDA and  has been authorized for detection and/or diagnosis of SARS-CoV-2 by FDA under an Emergency Use Authorization (EUA). This EUA will remain  in effect (meaning this test can be used) for the duration of the COVID-19 declaration under Section 564(b)(1) of the Act, 21 U.S.C.section 360bbb-3(b)(1), unless the authorization is terminated  or revoked  sooner.       Influenza A by PCR NEGATIVE NEGATIVE Final   Influenza B by PCR NEGATIVE NEGATIVE Final    Comment: (NOTE) The Xpert Xpress SARS-CoV-2/FLU/RSV plus assay is intended as an aid in the diagnosis of influenza from Nasopharyngeal swab specimens and should not be used as a sole basis for treatment. Nasal washings and aspirates are unacceptable for Xpert Xpress SARS-CoV-2/FLU/RSV testing.  Fact Sheet for Patients: EntrepreneurPulse.com.au  Fact Sheet for Healthcare Providers: IncredibleEmployment.be  This test is not yet approved or cleared by the [place] FDA and has been authorized for detection and/or diagnosis of SARS-CoV-2 by FDA under an Emergency Use Authorization (EUA). This EUA will remain in effect (meaning this test can be used) for the duration of the COVID-19 declaration under Section 564(b)(1)  of the Act, 21 U.S.C. section 360bbb-3(b)(1), unless the authorization is terminated or revoked.  Performed at [facility], [address] [place], [place] [address]      Labs: BNP (last 3 results) Recent Labs    04/15/21 0631 05/09/21 0203  BNP 65.4 54.0   Basic Metabolic Panel: Recent Labs  Lab 05/09/21 0203 05/10/21 0450  NA 142 141  K 3.8 3.5  CL 107 108  CO2 26 26  GLUCOSE 114* 108*  BUN 13 8  CREATININE 0.70 0.72  CALCIUM 9.0 8.9   Liver Function Tests: Recent Labs  Lab 05/09/21 0203  AST 15  ALT 11  ALKPHOS 66  BILITOT 0.4  PROT 6.5  ALBUMIN 3.5   No results for input(s): LIPASE, AMYLASE in the last 168 hours. No results for input(s): AMMONIA in the last 168 hours. CBC: Recent Labs  Lab 05/09/21 0203 05/09/21 1520 05/10/21 0450 05/12/21 0523  WBC 7.2  --  7.8 8.8  NEUTROABS 4.8  --   --   --   HGB 7.9* 8.5* 8.1* 8.3*  HCT 28.5* 30.6* 29.2* 30.4*  MCV 71.1*  --  71.2* 72.6*  PLT 590*  --  665* 586*   Cardiac Enzymes: No results for input(s): CKTOTAL, CKMB,  CKMBINDEX, TROPONINI in the last 168 hours. BNP: Invalid input(s): POCBNP CBG: Recent Labs  Lab 05/11/21 1142 05/11/21 1701 05/11/21 2152 05/12/21 0730 05/12/21 1127  GLUCAP 91 120* 144* 144* 160*   D-Dimer No results for input(s): DDIMER in the last 72 hours. Hgb A1c Recent Labs    05/09/21 1522  HGBA1C 5.8*   Lipid Profile No results for input(s): CHOL, HDL, LDLCALC, TRIG, CHOLHDL, LDLDIRECT in the last 72 hours. Thyroid function studies No results for input(s): TSH, T4TOTAL, T3FREE, THYROIDAB in the last 72 hours.  Invalid input(s): FREET3 Anemia work up No results for input(s): VITAMINB12, FOLATE, FERRITIN, TIBC, IRON, RETICCTPCT in the last 72 hours. Urinalysis    Component Value Date/Time   COLORURINE YELLOW 04/12/2020 1351   APPEARANCEUR HAZY (A) 04/12/2020 1351   LABSPEC 1.023 04/12/2020 1351   PHURINE 5.0 04/12/2020 1351   GLUCOSEU NEGATIVE 04/12/2020 1351   HGBUR NEGATIVE 04/12/2020 1351   BILIRUBINUR NEGATIVE 04/12/2020 1351   KETONESUR 5 (A) 04/12/2020 1351   PROTEINUR NEGATIVE 04/12/2020 1351   UROBILINOGEN 0.2 04/03/2013 1106   NITRITE NEGATIVE 04/12/2020 1351   LEUKOCYTESUR NEGATIVE 04/12/2020 1351   Sepsis Labs Invalid input(s): PROCALCITONIN,  WBC,  LACTICIDVEN Microbiology Recent Results (from the past 240 hour(s))  Resp Panel by RT-PCR (Flu A&B, Covid) Nasopharyngeal Swab     Status: None   Collection Time: 05/09/21  2:04 AM   Specimen: Nasopharyngeal Swab; Nasopharyngeal(NP) swabs in vial transport medium  Result Value Ref Range Status   SARS Coronavirus 2 by RT PCR NEGATIVE NEGATIVE Final    Comment: (NOTE) SARS-CoV-2 target nucleic acids are NOT DETECTED.  The SARS-CoV-2 RNA is generally detectable in upper respiratory specimens during the acute phase of infection. The lowest concentration of SARS-CoV-2 viral copies this assay can detect is 138 copies/mL. A negative result does not preclude SARS-Cov-2 infection and should not be used  as the sole basis for treatment or other patient management decisions. A negative result may occur with  improper specimen collection/handling, submission of specimen other than nasopharyngeal swab, presence of viral mutation(s) within the areas targeted by this assay, and inadequate number of viral copies(<138 copies/mL). A negative result must be combined with clinical observations, patient history,  and epidemiological information. The expected result is Negative.  Fact Sheet for Patients:  EntrepreneurPulse.com.au  Fact Sheet for Healthcare Providers:  IncredibleEmployment.be  This test is no t yet approved or cleared by the [place] FDA and  has been authorized for detection and/or diagnosis of SARS-CoV-2 by FDA under an Emergency Use Authorization (EUA). This EUA will remain  in effect (meaning this test can be used) for the duration of the COVID-19 declaration under Section 564(b)(1) of the Act, 21 U.S.C.section 360bbb-3(b)(1), unless the authorization is terminated  or revoked sooner.       Influenza A by PCR NEGATIVE NEGATIVE Final   Influenza B by PCR NEGATIVE NEGATIVE Final    Comment: (NOTE) The Xpert Xpress SARS-CoV-2/FLU/RSV plus assay is intended as an aid in the diagnosis of influenza from Nasopharyngeal swab specimens and should not be used as a sole basis for treatment. Nasal washings and aspirates are unacceptable for Xpert Xpress SARS-CoV-2/FLU/RSV testing.  Fact Sheet for Patients: EntrepreneurPulse.com.au  Fact Sheet for Healthcare Providers: IncredibleEmployment.be  This test is not yet approved or cleared by the [place] FDA and has been authorized for detection and/or diagnosis of SARS-CoV-2 by FDA under an Emergency Use Authorization (EUA). This EUA will remain in effect (meaning this test can be used) for the duration of the COVID-19 declaration under Section 564(b)(1)  of the Act, 21 U.S.C. section 360bbb-3(b)(1), unless the authorization is terminated or revoked.  Performed at [facility], [address] [place], [place] [address]    Time spent: 30 min  SIGNED:   Marylu Lund, MD  [facility] 05/12/2021, 11:37 AM  If 7PM-7AM, please contact night-coverage

## 2021-05-12 NOTE — Plan of Care (Signed)
Pt for discharge this afternoon home.  AVS printed and reviewed with patient at bedside.  All follow ups and new medications discussed in detail.  All questions addressed, verbalized understanding.   Problem: Health Behavior/Discharge Planning: Goal: Ability to manage health-related needs will improve 05/12/2021 1230 by Rutherford Guys, RN Outcome: Adequate for Discharge   Problem: Coping: Goal: Level of anxiety will decrease 05/12/2021 1230 by Rutherford Guys, RN Outcome: Adequate for Discharge   Problem: Pain Managment: Goal: General experience of comfort will improve 05/12/2021 1230 by Rutherford Guys, RN Outcome: Adequate for Discharge   Problem: Safety: Goal: Ability to remain free from injury will improve 05/12/2021 1230 by Rutherford Guys, RN Outcome: Adequate for Discharge

## 2021-05-12 NOTE — Progress Notes (Signed)
Ambulatory pulse oximetry complete.  Pt ambulated approx 150' on room air, lowest saturation noted at 91% No oxygen requirements at this time.  Md notified.

## 2021-05-14 ENCOUNTER — Encounter (HOSPITAL_COMMUNITY): Payer: Self-pay | Admitting: Gastroenterology

## 2021-06-06 DIAGNOSIS — Z0001 Encounter for general adult medical examination with abnormal findings: Secondary | ICD-10-CM | POA: Diagnosis not present

## 2021-06-06 DIAGNOSIS — E1140 Type 2 diabetes mellitus with diabetic neuropathy, unspecified: Secondary | ICD-10-CM | POA: Diagnosis not present

## 2021-06-06 DIAGNOSIS — G894 Chronic pain syndrome: Secondary | ICD-10-CM | POA: Diagnosis not present

## 2021-06-06 DIAGNOSIS — H903 Sensorineural hearing loss, bilateral: Secondary | ICD-10-CM | POA: Diagnosis not present

## 2021-06-06 DIAGNOSIS — F17200 Nicotine dependence, unspecified, uncomplicated: Secondary | ICD-10-CM | POA: Diagnosis not present

## 2021-06-06 DIAGNOSIS — H6982 Other specified disorders of Eustachian tube, left ear: Secondary | ICD-10-CM | POA: Diagnosis not present

## 2021-06-06 DIAGNOSIS — H6123 Impacted cerumen, bilateral: Secondary | ICD-10-CM | POA: Diagnosis not present

## 2021-06-06 DIAGNOSIS — I10 Essential (primary) hypertension: Secondary | ICD-10-CM | POA: Diagnosis not present

## 2021-06-06 DIAGNOSIS — J449 Chronic obstructive pulmonary disease, unspecified: Secondary | ICD-10-CM | POA: Diagnosis not present

## 2021-06-06 DIAGNOSIS — E7800 Pure hypercholesterolemia, unspecified: Secondary | ICD-10-CM | POA: Diagnosis not present

## 2021-06-11 DIAGNOSIS — J449 Chronic obstructive pulmonary disease, unspecified: Secondary | ICD-10-CM | POA: Diagnosis not present

## 2021-06-11 DIAGNOSIS — K31811 Angiodysplasia of stomach and duodenum with bleeding: Secondary | ICD-10-CM | POA: Diagnosis not present

## 2021-06-11 DIAGNOSIS — M549 Dorsalgia, unspecified: Secondary | ICD-10-CM | POA: Diagnosis not present

## 2021-07-03 DIAGNOSIS — J449 Chronic obstructive pulmonary disease, unspecified: Secondary | ICD-10-CM | POA: Diagnosis not present

## 2021-07-03 DIAGNOSIS — E7800 Pure hypercholesterolemia, unspecified: Secondary | ICD-10-CM | POA: Diagnosis not present

## 2021-07-03 DIAGNOSIS — I10 Essential (primary) hypertension: Secondary | ICD-10-CM | POA: Diagnosis not present

## 2021-07-03 DIAGNOSIS — F17200 Nicotine dependence, unspecified, uncomplicated: Secondary | ICD-10-CM | POA: Diagnosis not present

## 2021-07-03 DIAGNOSIS — E1140 Type 2 diabetes mellitus with diabetic neuropathy, unspecified: Secondary | ICD-10-CM | POA: Diagnosis not present

## 2021-07-03 DIAGNOSIS — G894 Chronic pain syndrome: Secondary | ICD-10-CM | POA: Diagnosis not present

## 2021-08-14 ENCOUNTER — Other Ambulatory Visit: Payer: Self-pay

## 2021-08-14 ENCOUNTER — Encounter: Payer: Self-pay | Admitting: Cardiology

## 2021-08-14 ENCOUNTER — Ambulatory Visit: Payer: Medicare Other | Admitting: Cardiology

## 2021-08-14 VITALS — BP 144/60 | HR 65 | Temp 97.8°F | Ht 60.0 in | Wt 210.0 lb

## 2021-08-14 DIAGNOSIS — M79604 Pain in right leg: Secondary | ICD-10-CM | POA: Diagnosis not present

## 2021-08-14 DIAGNOSIS — I739 Peripheral vascular disease, unspecified: Secondary | ICD-10-CM | POA: Diagnosis not present

## 2021-08-14 DIAGNOSIS — I6523 Occlusion and stenosis of bilateral carotid arteries: Secondary | ICD-10-CM | POA: Diagnosis not present

## 2021-08-14 DIAGNOSIS — F17200 Nicotine dependence, unspecified, uncomplicated: Secondary | ICD-10-CM

## 2021-08-14 DIAGNOSIS — E782 Mixed hyperlipidemia: Secondary | ICD-10-CM | POA: Diagnosis not present

## 2021-08-14 DIAGNOSIS — I2510 Atherosclerotic heart disease of native coronary artery without angina pectoris: Secondary | ICD-10-CM

## 2021-08-14 DIAGNOSIS — E119 Type 2 diabetes mellitus without complications: Secondary | ICD-10-CM

## 2021-08-14 DIAGNOSIS — I10 Essential (primary) hypertension: Secondary | ICD-10-CM | POA: Diagnosis not present

## 2021-08-14 MED ORDER — EZETIMIBE 10 MG PO TABS: 10.0000 mg | ORAL_TABLET | Freq: Every day | ORAL | 0 refills | Status: DC

## 2021-08-14 MED ORDER — CILOSTAZOL 50 MG PO TABS: 50.0000 mg | ORAL_TABLET | Freq: Two times a day (BID) | ORAL | 0 refills | Status: DC

## 2021-08-14 NOTE — Progress Notes (Signed)
[place] Date of Birth: Apr 16, 1951 MRN: [id number] Primary Care Provider:Osei-Bonsu, Iona Beard, MD Former Cardiology Providers: Jeri Lager, APRN, FNP-C Primary Cardiologist: Rex Kras, DO, [facility] (established care 08/30/2019)  Date: 08/14/21 Last Office Visit: 03/01/2021  Chief Complaint  Patient presents with   Leg Pain    HPI  [name] is a 71 y.o.  female who presents to the office with a chief complaint of " right leg pain." Patient's past medical history and cardiovascular risk factors include: Established coronary artery disease without angina pectoris, hypertension, hyperlipidemia, active smoking, peripheral artery disease status post PV angiogram with revascularization of the SFA, non-insulin-dependent diabetes, postmenopausal female, advanced age, obesity due to excess calories, history of prior GI bleed.  Patient being followed by the practice for management of CAD, carotid artery disease, and PAD.  She presents today sooner than her scheduled office visit due to right lower extremity pain.  The discomfort has been present for the last 1.5 months.  She describes the pain as " horse needle poked me."  The pain can be exacerbated after related activities at times improves with rest but not always.  She also experiences rest pain.  Majority of pain is located over the anterior and lateral aspect of the mid thigh.  No falls or syncopal event.  She has gained approximately 17 pounds over the last 1 year.  She continues to smoke despite educating her multiple times (approximately 5 cigarettes/day).  She denies chest pain at rest or with effort related activities.  She denies orthopnea, paroxysmal nocturnal dyspnea or lower extremity swelling.  Her shortness of breath is at baseline.   Her blood pressure is elevated at today's office visit but she is attributing that the pain.  ALLERGIES: No Known Allergies  MEDICATION LIST PRIOR TO VISIT: Current Outpatient  Medications on File Prior to Visit  Medication Sig Dispense Refill   albuterol (PROVENTIL HFA;VENTOLIN HFA) 108 (90 Base) MCG/ACT inhaler Inhale 1-2 puffs into the lungs every 6 (six) hours as needed for wheezing or shortness of breath.     amLODipine (NORVASC) 10 MG tablet Take 10 mg by mouth daily.      ASPIRIN LOW DOSE 81 MG EC tablet TAKE 1 TABLET(81 MG) BY MOUTH DAILY 30 tablet 0   atorvastatin (LIPITOR) 80 MG tablet Take 80 mg by mouth daily.     ferrous sulfate 325 (65 FE) MG tablet Take 325 mg by mouth daily.     gabapentin (NEURONTIN) 600 MG tablet Take 1 tablet (600 mg total) by mouth at bedtime. (Patient taking differently: Take 600 mg by mouth 2 (two) times daily.) 30 tablet 11   hydrALAZINE (APRESOLINE) 100 MG tablet TAKE 1 TABLET BY MOUTH THREE TIMES DAILY 270 tablet 1   ipratropium-albuterol (DUONEB) 0.5-2.5 (3) MG/3ML SOLN Take 3 mLs by nebulization 3 (three) times daily. 270 mL 0   losartan (COZAAR) 100 MG tablet Take 100 mg by mouth daily.     metFORMIN (GLUCOPHAGE) 500 MG tablet Take 0.5 tablets (250 mg total) by mouth 2 (two) times daily with a meal.     nitroGLYCERIN (NITROSTAT) 0.4 MG SL tablet Place 1 tablet (0.4 mg total) under the tongue every 5 (five) minutes as needed for chest pain. 90 tablet 3   pantoprazole (PROTONIX) 40 MG tablet Take 40 mg by mouth daily.     spironolactone (ALDACTONE) 50 MG tablet TAKE 1 TABLET(50 MG) BY MOUTH DAILY 90 tablet 3   No current facility-administered medications on file prior  to visit.    PAST MEDICAL HISTORY: Past Medical History:  Diagnosis Date   Anemia 12/26/2015   Anxiety    Arthritis    Blood transfusion without reported diagnosis    Chronic pain    resolved per pt 04/12/20   COPD (chronic obstructive pulmonary disease) (HCC)    Coronary artery disease    Depression    Diabetes mellitus without complication (HCC)    type 2   GERD (gastroesophageal reflux disease)    Heart murmur    since birth; Echo 02/18/19: LVEF  26%, grade 1 diastolic dysfunction, mild AS (mean grad 12 mmHg), trace MR/PR, mild TR, PASP 32 mmHg     Hyperlipemia    Hypertension    PVD (peripheral vascular disease) ([place])    right SFA stent 02/11/17 by Dr. Einar Gip   Sleep apnea    does not use cpap   Wears glasses     PAST SURGICAL HISTORY: Past Surgical History:  Procedure Laterality Date   ABDOMINAL HYSTERECTOMY     CARDIAC CATHETERIZATION     CATARACT EXTRACTION     CESAREAN SECTION     COLONOSCOPY N/A 01/08/2013   Procedure: COLONOSCOPY;  Surgeon: Beryle Beams, MD;  Location: [facility];  Service: Endoscopy;  Laterality: N/A;   COLONOSCOPY N/A 12/28/2015   Procedure: COLONOSCOPY;  Surgeon: Carol Ada, MD;  Location: [place];  Service: Endoscopy;  Laterality: N/A;   COLONOSCOPY WITH PROPOFOL N/A 10/05/2020   Procedure: COLONOSCOPY WITH PROPOFOL;  Surgeon: Carol Ada, MD;  Location: [facility];  Service: Endoscopy;  Laterality: N/A;   ENTEROSCOPY N/A 12/28/2015   Procedure: ENTEROSCOPY;  Surgeon: Carol Ada, MD;  Location: [place];  Service: Endoscopy;  Laterality: N/A;   ENTEROSCOPY N/A 02/20/2018   Procedure: ENTEROSCOPY;  Surgeon: Carol Ada, MD;  Location: [facility];  Service: Endoscopy;  Laterality: N/A;   ENTEROSCOPY N/A 03/27/2018   Procedure: ENTEROSCOPY;  Surgeon: Carol Ada, MD;  Location: [facility];  Service: Endoscopy;  Laterality: N/A;   ENTEROSCOPY N/A 10/05/2020   Procedure: ENTEROSCOPY;  Surgeon: Carol Ada, MD;  Location: [facility];  Service: Endoscopy;  Laterality: N/A;   ENTEROSCOPY N/A 05/11/2021   Procedure: ENTEROSCOPY;  Surgeon: Carol Ada, MD;  Location: [facility];  Service: Endoscopy;  Laterality: N/A;   ESOPHAGOGASTRODUODENOSCOPY N/A 01/08/2013   Procedure: ESOPHAGOGASTRODUODENOSCOPY (EGD);  Surgeon: Beryle Beams, MD;  Location: [facility];  Service: Endoscopy;  Laterality: N/A;   EYE SURGERY     HAND SURGERY     HEMOSTASIS CLIP PLACEMENT  05/11/2021    Procedure: HEMOSTASIS CLIP PLACEMENT;  Surgeon: Carol Ada, MD;  Location: [facility];  Service: Endoscopy;;   HOT HEMOSTASIS N/A 12/28/2015   Procedure: HOT HEMOSTASIS (ARGON PLASMA COAGULATION/BICAP);  Surgeon: Carol Ada, MD;  Location: [facility] ENDOSCOPY;  Service: Endoscopy;  Laterality: N/A;   HOT HEMOSTASIS N/A 02/20/2018   Procedure: HOT HEMOSTASIS (ARGON PLASMA COAGULATION/BICAP);  Surgeon: Carol Ada, MD;  Location: [facility];  Service: Endoscopy;  Laterality: N/A;   HOT HEMOSTASIS N/A 03/27/2018   Procedure: HOT HEMOSTASIS (ARGON PLASMA COAGULATION/BICAP);  Surgeon: Carol Ada, MD;  Location: [facility];  Service: Endoscopy;  Laterality: N/A;   HOT HEMOSTASIS N/A 10/05/2020   Procedure: HOT HEMOSTASIS (ARGON PLASMA COAGULATION/BICAP);  Surgeon: Carol Ada, MD;  Location: [facility];  Service: Endoscopy;  Laterality: N/A;   HOT HEMOSTASIS N/A 05/11/2021   Procedure: HOT HEMOSTASIS (ARGON PLASMA COAGULATION/BICAP);  Surgeon: Carol Ada, MD;  Location: [facility];  Service: Endoscopy;  Laterality: N/A;  LEFT HEART CATHETERIZATION WITH CORONARY ANGIOGRAM N/A 03/09/2013   Procedure: LEFT HEART CATHETERIZATION WITH CORONARY ANGIOGRAM;  Surgeon: Laverda Page, MD;  Location: [facility] CATH LAB;  Service: Cardiovascular;  Laterality: N/A;   LOWER EXTREMITY ANGIOGRAM N/A 12/29/2012   Procedure: LOWER EXTREMITY ANGIOGRAM;  Surgeon: Laverda Page, MD;  Location: [facility] CATH LAB;  Service: Cardiovascular;  Laterality: N/A;   LOWER EXTREMITY ANGIOGRAM N/A 10/05/2013   Procedure: LOWER EXTREMITY ANGIOGRAM;  Surgeon: Laverda Page, MD;  Location: [facility] CATH LAB;  Service: Cardiovascular;  Laterality: N/A;   LOWER EXTREMITY ANGIOGRAPHY N/A 02/11/2017   Procedure: Lower Extremity Angiography;  Surgeon: Adrian Prows, MD;  Location: [place] CV LAB;  Service: Cardiovascular;  Laterality: N/A;   PERIPHERAL VASCULAR BALLOON ANGIOPLASTY  02/11/2017   Procedure: PERIPHERAL VASCULAR BALLOON  ANGIOPLASTY;  Surgeon: Adrian Prows, MD;  Location: [place] CV LAB;  Service: Cardiovascular;;  Right SFA   PERIPHERAL VASCULAR INTERVENTION Right 02/11/2017   Procedure: PERIPHERAL VASCULAR INTERVENTION;  Surgeon: Adrian Prows, MD;  Location: [place] CV LAB;  Service: Cardiovascular;  Laterality: Right;  Rt SFA   POLYPECTOMY  10/05/2020   Procedure: POLYPECTOMY;  Surgeon: Carol Ada, MD;  Location: [facility];  Service: Endoscopy;;   stent in right leg  12/29/2012   for blood clot, Dr. Nadyne Coombes   TOTAL KNEE ARTHROPLASTY Left 04/18/2020   TOTAL KNEE ARTHROPLASTY Left 04/18/2020   Procedure: LEFT TOTAL KNEE ARTHROPLASTY;  Surgeon: Meredith Pel, MD;  Location: [place];  Service: Orthopedics;  Laterality: Left;   TYMPANOMASTOIDECTOMY Left 03/08/2014   Procedure: TYMPANOMASTOIDECTOMY LEFT;  Surgeon: Ascencion Dike, MD;  Location: [place];  Service: ENT;  Laterality: Left;   TYMPANOMASTOIDECTOMY  2015   UTERINE FIBROID SURGERY      FAMILY HISTORY: The patient's family history includes Diabetes in her brother, brother, and mother; Heart disease in her father and mother; Hypertension in her brother, brother, brother, brother, brother, father, mother, sister, and sister.   SOCIAL HISTORY:  The patient  reports that she has quit smoking. Her smoking use included cigarettes. She has a 20.50 pack-year smoking history. She has never used smokeless tobacco. She reports current alcohol use. She reports current drug use. Drugs: Marijuana and Cocaine.  Review of Systems  Constitutional: Positive for weight gain. Negative for chills, fever and weight loss.  HENT:  Negative for hoarse voice and nosebleeds.   Eyes:  Negative for discharge, double vision and pain.  Cardiovascular:  Positive for claudication and dyspnea on exertion (chronic). Negative for chest pain, leg swelling, near-syncope, orthopnea, palpitations, paroxysmal nocturnal dyspnea and syncope.  Respiratory:  Negative for  hemoptysis and shortness of breath.   Musculoskeletal:  Positive for arthritis and joint pain. Negative for muscle cramps and myalgias.       Right leg pain  Gastrointestinal:  Negative for abdominal pain, constipation, diarrhea, hematemesis, hematochezia, melena, nausea and vomiting.  Neurological:  Positive for dizziness and light-headedness.   PHYSICAL EXAM: Vitals with BMI 08/14/2021 05/12/2021 05/12/2021  Height 5\' 0"  - -  Weight 210 lbs - -  BMI 75.91 - -  Systolic 638 466 599  Diastolic 60 80 69  Pulse 65 83 96    CONSTITUTIONAL: Well-developed and well-nourished. No acute distress.  SKIN: Skin is warm and dry. No rash noted. No cyanosis. No pallor. No jaundice HEAD: Normocephalic and atraumatic.  EYES: No scleral icterus MOUTH/THROAT: Moist oral membranes.  NECK: No JVD present. No thyromegaly noted.  Left carotid bruits  LYMPHATIC: No visible cervical adenopathy.  CHEST Normal respiratory effort. No intercostal retractions  LUNGS: Clear to auscultation bilaterally.  No stridor. No wheezes. No rales.  CARDIOVASCULAR: Regular, positive F8-H8, soft systolic ejection murmur, no gallops or rubs. ABDOMINAL: Obese, soft, nontender, nondistended, positive bowel sounds in all 4 quadrants, no apparent ascites.  EXTREMITIES: Trace bilateral peripheral edema, warm to touch bilaterally.  1+ bilateral femoral pulses (difficult to palpate due to pannus), unable to palpate bilateral popliteal pulses or posterior tibial pulses, 2+ bilateral dorsalis pedis pulses.  Tender to touch over the anterior and lateral aspect of the mid thigh.  No discoloration/cyanosis/clubbing of the distal extremity. HEMATOLOGIC: No significant bruising NEUROLOGIC: Oriented to person, place, and time. Nonfocal. Normal muscle tone.  PSYCHIATRIC: Normal mood and affect. Normal behavior. Cooperative  CARDIAC DATABASE: EKG: 08/14/2021: NSR, 68 bpm, normal axis, PACs, without underlying injury  pattern.  Echocardiogram: 01/30/2021: Normal LV systolic function with visual EF 55-60%. Left ventricle cavity is normal in size. Mild left ventricular hypertrophy. Normal global wall motion. Normal diastolic filling pattern, normal LAP.  Mild aortic valve stenosis (Peak velocity 2.58m/s, PG 48mmHg, MG 12.50mmHG, AVA 1.4cm2).  Mild (Grade I) mitral regurgitation. Mild tricuspid regurgitation. No evidence of pulmonary hypertension. Mild pulmonic regurgitation. Compared to study 02/18/2019: AS remains stable otherwise no significant change.    Stress Testing:  Exercise myoview stress 05/19/2017: 1. Patient achieved 4.4 METS and reached 83% target heart rate. Exercise capacity low for age. Appropriate heart rate and blood pressure response. Stress symptoms included dyspnea, dizziness. Submaximal exercise stress study. 2. No stress EKG changes suggestive of ischemia. 3. Stress and rest SPECT images demonstrate homogeneous tracer distribution throughout the myocardium. Gated SPECT imaging reveals normal myocardial thickening and wall motion. The left ventricular ejection fraction was normal (65%).   4. This is a low risk submaximal exercise stress study  Heart Catheterization: Coronary angiogram 02/27/2014: Mid RCA 60-70% stenosis, mid LAD 60-70% stenosis. FFR to the LAD, lesion insignificant. Medical therapy for CAD. Moderate aortic valve calcification.  Carotid artery duplex 01/30/2021:  Duplex suggests stenosis in the right internal carotid artery (50-69%).  Duplex suggests stenosis in the right external carotid artery (<50%).  Duplex suggests stenosis in the left internal carotid artery (50-69%).  Duplex suggests stenosis in the left external carotid artery (<50%).  Antegrade right vertebral artery flow. Antegrade left vertebral artery flow.  Follow up in six months is appropriate if clinically indicated.    Peripheral arteriogram 02/11/2017: Successful PTA with DCB 5x150x2 InPact [place] in  the right proximal to distal SFA and stenting of proximal SFA with DES, [place] 6x100 mm stent. 3 vessel r/o. Left mild disease and 2 vessel R/O. Mild disease left by angiogram 10/05/2013.   Lower extremity arterial duplex 04/15/2018: No hemodynamically significant stenoses are identified in the lower extremity arterial system. Mild diffuse calcific plaque noted. This exam reveals mildly decreased perfusion of the right lower extremity, noted at the anterior tibial artery level (ABI 0.86) and normal perfusion of the left lower extremity (ABI 0.97). No signficant change since 05/28/2017. Study suggests patent right SFA angioplasty site  LABORATORY DATA: CBC Latest Ref Rng & Units 05/12/2021 05/10/2021 05/09/2021  WBC 4.0 - 10.5 K/uL 8.8 7.8 -  Hemoglobin 12.0 - 15.0 g/dL 8.3(L) 8.1(L) 8.5(L)  Hematocrit 36.0 - 46.0 % 30.4(L) 29.2(L) 30.6(L)  Platelets 150 - 400 K/uL 586(H) 665(H) -    CMP Latest Ref Rng & Units 05/10/2021 05/09/2021 04/15/2021  Glucose 70 - 99 mg/dL 108(H) 114(H) 101(H)  BUN 8 - 23 mg/dL 8 13 9   Creatinine 0.44 - 1.00 mg/dL 0.72 0.70 0.74  Sodium 135 - 145 mmol/L 141 142 135  Potassium 3.5 - 5.1 mmol/L 3.5 3.8 3.6  Chloride 98 - 111 mmol/L 108 107 105  CO2 22 - 32 mmol/L 26 26 26   Calcium 8.9 - 10.3 mg/dL 8.9 9.0 9.1  Total Protein 6.5 - 8.1 g/dL - 6.5 7.4  Total Bilirubin 0.3 - 1.2 mg/dL - 0.4 0.4  Alkaline Phos 38 - 126 U/L - 66 67  AST 15 - 41 U/L - 15 17  ALT 0 - 44 U/L - 11 9    Lipid Panel  Lab Results  Component Value Date   CHOL 138 03/26/2021   HDL 54 03/26/2021   LDLCALC 69 03/26/2021   LDLDIRECT 71 03/26/2021   TRIG 78 03/26/2021   CHOLHDL 4.5 05/12/2012     Lab Results  Component Value Date   HGBA1C 5.8 (H) 05/09/2021   HGBA1C 6.3 (H) 04/12/2020   HGBA1C 5.6 12/26/2015   No components found for: NTPROBNP Lab Results  Component Value Date   TSH 1.528 05/11/2012   TSH 2.688 01/10/2011   TSH 1.637 12/18/2007    Cardiac Panel (last 3  results) No results for input(s): CKTOTAL, CKMB, TROPONINIHS, RELINDX in the last 72 hours.  IMPRESSION:    ICD-10-CM   1. PAD (peripheral artery disease) (HCC)  I73.9 LE ARTERIAL    cilostazol (PLETAL) 50 MG tablet    ABI WITH/WO TBI    IVC/ILIACS DUPLEX    2. Pain of right lower extremity  M79.604 VAS [place] LOWER EXTREMITY VENOUS (DVT)    3. Atherosclerosis of native coronary artery of native heart without angina pectoris  I25.10 EKG 12-Lead    4. Carotid artery stenosis, asymptomatic, bilateral  I65.23     5. Essential hypertension  I10     6. Non-insulin dependent type 2 diabetes mellitus ([place])  E11.9     7. Tobacco use disorder  F17.200     8. Mixed hyperlipidemia  E78.2        RECOMMENDATIONS: Kursten Kruk is a 71 y.o. female whose past medical history and cardiovascular risk factors include: Established coronary artery disease without angina pectoris, hypertension, hyperlipidemia, active smoking, peripheral artery disease status post PV angiogram with revascularization of the SFA, non-insulin-dependent diabetes, postmenopausal female, advanced age, obesity due to excess calories, history of prior GI bleed.  PAD (peripheral artery disease) (HCC) Exacerbation, sick visit No evidence of critical limb ischemia. However his symptoms are very suggestive of claudication as well as neuropathy. She has had peripheral interventions in the past but no recent arterial duplex as she has been refusing it during prior visits. We will order lower extremity arterial duplex to evaluate for stent patency and to reevaluate the degree/severity of PAD. Unable to perform the studies in clinic as a vascular tech is unavailable. Continue aspirin and statin therapy. Start cilostazol 50 mg p.o. twice daily. Reemphasized importance of complete smoking cessation. Further recommendations to follow.  Pain of right lower extremity Patient complains of pain in the right lower extremity. No  significant asymmetry noted on physical examination. Right lower extremity venous duplex to rule out DVT  Atherosclerosis of native coronary artery of native heart without angina pectoris Denies angina pectoris.   No use of sublingual nitroglycerin tablet since last office visit. During prior office visit she has been educated on the importance of improving her modifiable cardiovascular risk factors -but  has not been successful.  Carotid artery stenosis, asymptomatic, bilateral Continue medical management. Repeat carotid duplex scheduled for March 2023  Essential hypertension Office blood pressures are slightly elevated likely due to pain/discomfort. Medications reconciled. Monitor for now  Non-insulin dependent type 2 diabetes mellitus ([place]) Educated on the importance of glycemic control given her known history of CAD/PAD/carotid artery stenosis. LDL within acceptable range.  Would recommend a goal LDL of less than 55 mg/dL given her comorbid conditions. Currently managed by primary care provider.  Tobacco use disorder Tobacco cessation counseling: Currently smoking 0.5 packs/day   Patient was informed of the dangers of tobacco abuse including stroke, cancer, and MI, as well as benefits of tobacco cessation. Patient is not willing to quit at this time. 5 mins were spent counseling patient cessation techniques. We discussed various methods to help quit smoking, including deciding on a date to quit, joining a support group, pharmacological agents- nicotine gum/patch/lozenges.  I will reassess her progress at the next follow-up visit  Total time spent: 45 minutes.  FINAL MEDICATION LIST END OF ENCOUNTER: Meds ordered this encounter  Medications   cilostazol (PLETAL) 50 MG tablet    Sig: Take 1 tablet (50 mg total) by mouth 2 (two) times daily.    Dispense:  60 tablet    Refill:  0      Current Outpatient Medications:    albuterol (PROVENTIL HFA;VENTOLIN HFA) 108 (90 Base)  MCG/ACT inhaler, Inhale 1-2 puffs into the lungs every 6 (six) hours as needed for wheezing or shortness of breath., Disp: , Rfl:    amLODipine (NORVASC) 10 MG tablet, Take 10 mg by mouth daily. , Disp: , Rfl:    ASPIRIN LOW DOSE 81 MG EC tablet, TAKE 1 TABLET(81 MG) BY MOUTH DAILY, Disp: 30 tablet, Rfl: 0   atorvastatin (LIPITOR) 80 MG tablet, Take 80 mg by mouth daily., Disp: , Rfl:    cilostazol (PLETAL) 50 MG tablet, Take 1 tablet (50 mg total) by mouth 2 (two) times daily., Disp: 60 tablet, Rfl: 0   ferrous sulfate 325 (65 FE) MG tablet, Take 325 mg by mouth daily., Disp: , Rfl:    gabapentin (NEURONTIN) 600 MG tablet, Take 1 tablet (600 mg total) by mouth at bedtime. (Patient taking differently: Take 600 mg by mouth 2 (two) times daily.), Disp: 30 tablet, Rfl: 11   hydrALAZINE (APRESOLINE) 100 MG tablet, TAKE 1 TABLET BY MOUTH THREE TIMES DAILY, Disp: 270 tablet, Rfl: 1   ipratropium-albuterol (DUONEB) 0.5-2.5 (3) MG/3ML SOLN, Take 3 mLs by nebulization 3 (three) times daily., Disp: 270 mL, Rfl: 0   losartan (COZAAR) 100 MG tablet, Take 100 mg by mouth daily., Disp: , Rfl:    metFORMIN (GLUCOPHAGE) 500 MG tablet, Take 0.5 tablets (250 mg total) by mouth 2 (two) times daily with a meal., Disp: , Rfl:    nitroGLYCERIN (NITROSTAT) 0.4 MG SL tablet, Place 1 tablet (0.4 mg total) under the tongue every 5 (five) minutes as needed for chest pain., Disp: 90 tablet, Rfl: 3   pantoprazole (PROTONIX) 40 MG tablet, Take 40 mg by mouth daily., Disp: , Rfl:    spironolactone (ALDACTONE) 50 MG tablet, TAKE 1 TABLET(50 MG) BY MOUTH DAILY, Disp: 90 tablet, Rfl: 3  Orders Placed This Encounter  Procedures   EKG 12-Lead   VAS [place] LOWER EXTREMITY VENOUS (DVT)   LE ARTERIAL   ABI WITH/WO TBI   IVC/ILIACS DUPLEX    --Continue cardiac medications as reconciled in final medication list. --Return in  about 2 weeks (around 08/28/2021) for Follow up right leg claudication and review test results. Or sooner if  needed. --Continue follow-up with your primary care physician regarding the management of your other chronic comorbid conditions.  Patient's questions and concerns were addressed to her satisfaction. She voices understanding of the instructions provided during this encounter.   This note was created using a voice recognition software as a result there may be grammatical errors inadvertently enclosed that do not reflect the nature of this encounter. Every attempt is made to correct such errors.  Rex Kras, [place], [facility]  Pager: [phone] Office: [phone]

## 2021-08-15 ENCOUNTER — Ambulatory Visit (HOSPITAL_COMMUNITY)
Admission: RE | Admit: 2021-08-15 | Discharge: 2021-08-15 | Disposition: A | Discharge status: Home / Self Care | Payer: Medicare Other | Source: Ambulatory Visit | Attending: Cardiology | Admitting: Cardiology

## 2021-08-15 DIAGNOSIS — M79604 Pain in right leg: Secondary | ICD-10-CM | POA: Diagnosis not present

## 2021-08-17 ENCOUNTER — Ambulatory Visit (INDEPENDENT_AMBULATORY_CARE_PROVIDER_SITE_OTHER)
Admission: RE | Admit: 2021-08-17 | Discharge: 2021-08-17 | Disposition: A | Discharge status: Home / Self Care | Payer: Medicare Other | Source: Ambulatory Visit | Attending: Cardiology | Admitting: Cardiology

## 2021-08-17 ENCOUNTER — Other Ambulatory Visit: Payer: Self-pay

## 2021-08-17 ENCOUNTER — Ambulatory Visit (HOSPITAL_COMMUNITY)
Admission: RE | Admit: 2021-08-17 | Discharge: 2021-08-17 | Disposition: A | Discharge status: Home / Self Care | Payer: Medicare Other | Source: Ambulatory Visit | Attending: Cardiology | Admitting: Cardiology

## 2021-08-17 DIAGNOSIS — I739 Peripheral vascular disease, unspecified: Secondary | ICD-10-CM | POA: Insufficient documentation

## 2021-08-23 ENCOUNTER — Inpatient Hospital Stay (HOSPITAL_COMMUNITY)
Admission: EM | Admit: 2021-08-23 | Discharge: 2021-08-29 | DRG: 190 | Disposition: A | Discharge status: Home / Self Care | Payer: Medicare Other | Attending: Internal Medicine | Admitting: Internal Medicine

## 2021-08-23 ENCOUNTER — Other Ambulatory Visit: Payer: Self-pay

## 2021-08-23 ENCOUNTER — Emergency Department (HOSPITAL_COMMUNITY): Payer: Medicare Other

## 2021-08-23 ENCOUNTER — Encounter (HOSPITAL_COMMUNITY): Payer: Self-pay | Admitting: Emergency Medicine

## 2021-08-23 DIAGNOSIS — I517 Cardiomegaly: Secondary | ICD-10-CM | POA: Diagnosis not present

## 2021-08-23 DIAGNOSIS — F32A Depression, unspecified: Secondary | ICD-10-CM | POA: Diagnosis not present

## 2021-08-23 DIAGNOSIS — Z833 Family history of diabetes mellitus: Secondary | ICD-10-CM

## 2021-08-23 DIAGNOSIS — R0902 Hypoxemia: Secondary | ICD-10-CM

## 2021-08-23 DIAGNOSIS — I499 Cardiac arrhythmia, unspecified: Secondary | ICD-10-CM | POA: Diagnosis not present

## 2021-08-23 DIAGNOSIS — R059 Cough, unspecified: Secondary | ICD-10-CM | POA: Diagnosis not present

## 2021-08-23 DIAGNOSIS — E1151 Type 2 diabetes mellitus with diabetic peripheral angiopathy without gangrene: Secondary | ICD-10-CM | POA: Diagnosis not present

## 2021-08-23 DIAGNOSIS — E66813 Obesity, class 3: Secondary | ICD-10-CM

## 2021-08-23 DIAGNOSIS — D509 Iron deficiency anemia, unspecified: Secondary | ICD-10-CM | POA: Diagnosis not present

## 2021-08-23 DIAGNOSIS — E119 Type 2 diabetes mellitus without complications: Secondary | ICD-10-CM | POA: Diagnosis not present

## 2021-08-23 DIAGNOSIS — Z90710 Acquired absence of both cervix and uterus: Secondary | ICD-10-CM | POA: Diagnosis not present

## 2021-08-23 DIAGNOSIS — E785 Hyperlipidemia, unspecified: Secondary | ICD-10-CM | POA: Diagnosis not present

## 2021-08-23 DIAGNOSIS — F17210 Nicotine dependence, cigarettes, uncomplicated: Secondary | ICD-10-CM | POA: Diagnosis present

## 2021-08-23 DIAGNOSIS — R5383 Other fatigue: Secondary | ICD-10-CM | POA: Diagnosis not present

## 2021-08-23 DIAGNOSIS — Z743 Need for continuous supervision: Secondary | ICD-10-CM | POA: Diagnosis not present

## 2021-08-23 DIAGNOSIS — K219 Gastro-esophageal reflux disease without esophagitis: Secondary | ICD-10-CM | POA: Diagnosis present

## 2021-08-23 DIAGNOSIS — Z8249 Family history of ischemic heart disease and other diseases of the circulatory system: Secondary | ICD-10-CM | POA: Diagnosis not present

## 2021-08-23 DIAGNOSIS — M1990 Unspecified osteoarthritis, unspecified site: Secondary | ICD-10-CM | POA: Diagnosis present

## 2021-08-23 DIAGNOSIS — F419 Anxiety disorder, unspecified: Secondary | ICD-10-CM | POA: Diagnosis present

## 2021-08-23 DIAGNOSIS — I739 Peripheral vascular disease, unspecified: Secondary | ICD-10-CM | POA: Diagnosis not present

## 2021-08-23 DIAGNOSIS — Z7982 Long term (current) use of aspirin: Secondary | ICD-10-CM

## 2021-08-23 DIAGNOSIS — Z96652 Presence of left artificial knee joint: Secondary | ICD-10-CM | POA: Diagnosis not present

## 2021-08-23 DIAGNOSIS — E876 Hypokalemia: Secondary | ICD-10-CM | POA: Diagnosis present

## 2021-08-23 DIAGNOSIS — D649 Anemia, unspecified: Secondary | ICD-10-CM

## 2021-08-23 DIAGNOSIS — Z7984 Long term (current) use of oral hypoglycemic drugs: Secondary | ICD-10-CM | POA: Diagnosis not present

## 2021-08-23 DIAGNOSIS — I2510 Atherosclerotic heart disease of native coronary artery without angina pectoris: Secondary | ICD-10-CM | POA: Diagnosis not present

## 2021-08-23 DIAGNOSIS — I10 Essential (primary) hypertension: Secondary | ICD-10-CM | POA: Diagnosis not present

## 2021-08-23 DIAGNOSIS — J9601 Acute respiratory failure with hypoxia: Secondary | ICD-10-CM | POA: Diagnosis not present

## 2021-08-23 DIAGNOSIS — Z79899 Other long term (current) drug therapy: Secondary | ICD-10-CM

## 2021-08-23 DIAGNOSIS — Z20822 Contact with and (suspected) exposure to covid-19: Secondary | ICD-10-CM | POA: Diagnosis not present

## 2021-08-23 DIAGNOSIS — R0602 Shortness of breath: Secondary | ICD-10-CM | POA: Diagnosis not present

## 2021-08-23 DIAGNOSIS — G4730 Sleep apnea, unspecified: Secondary | ICD-10-CM | POA: Diagnosis not present

## 2021-08-23 DIAGNOSIS — R6889 Other general symptoms and signs: Secondary | ICD-10-CM | POA: Diagnosis not present

## 2021-08-23 DIAGNOSIS — J441 Chronic obstructive pulmonary disease with (acute) exacerbation: Secondary | ICD-10-CM | POA: Diagnosis not present

## 2021-08-23 DIAGNOSIS — R0609 Other forms of dyspnea: Secondary | ICD-10-CM | POA: Diagnosis not present

## 2021-08-23 DIAGNOSIS — Z6841 Body Mass Index (BMI) 40.0 and over, adult: Secondary | ICD-10-CM | POA: Diagnosis not present

## 2021-08-23 LAB — COMPREHENSIVE METABOLIC PANEL
ALT: 10 U/L (ref 0–44)
AST: 16 U/L (ref 15–41)
Albumin: 3.4 g/dL — ABNORMAL LOW (ref 3.5–5.0)
Alkaline Phosphatase: 70 U/L (ref 38–126)
Anion gap: 7 (ref 5–15)
BUN: 16 mg/dL (ref 8–23)
CO2: 27 mmol/L (ref 22–32)
Calcium: 8.7 mg/dL — ABNORMAL LOW (ref 8.9–10.3)
Chloride: 106 mmol/L (ref 98–111)
Creatinine, Ser: 0.81 mg/dL (ref 0.44–1.00)
GFR, Estimated: >60 mL/min (ref >60)
Glucose, Bld: 99 mg/dL (ref 70–99)
Potassium: 2.9 mmol/L — ABNORMAL LOW (ref 3.5–5.1)
Sodium: 140 mmol/L (ref 135–145)
Total Bilirubin: 0.2 mg/dL — ABNORMAL LOW (ref 0.3–1.2)
Total Protein: 6.8 g/dL (ref 6.5–8.1)

## 2021-08-23 LAB — CBC WITH DIFFERENTIAL/PLATELET
Abs Immature Granulocytes: 0.08 10*3/uL — ABNORMAL HIGH (ref 0.00–0.07)
Basophils Absolute: 0.1 10*3/uL (ref 0.0–0.1)
Basophils Relative: 1 %
Eosinophils Absolute: 0.2 10*3/uL (ref 0.0–0.5)
Eosinophils Relative: 2 %
HCT: 29.7 % — ABNORMAL LOW (ref 36.0–46.0)
Hemoglobin: 8 g/dL — ABNORMAL LOW (ref 12.0–15.0)
Immature Granulocytes: 1 %
Lymphocytes Relative: 17 %
Lymphs Abs: 1.3 10*3/uL (ref 0.7–4.0)
MCH: 19.4 pg — ABNORMAL LOW (ref 26.0–34.0)
MCHC: 26.9 g/dL — ABNORMAL LOW (ref 30.0–36.0)
MCV: 72.1 fL — ABNORMAL LOW (ref 80.0–100.0)
Monocytes Absolute: 0.7 10*3/uL (ref 0.1–1.0)
Monocytes Relative: 9 %
Neutro Abs: 5.6 10*3/uL (ref 1.7–7.7)
Neutrophils Relative %: 70 %
Platelets: 414 10*3/uL — ABNORMAL HIGH (ref 150–400)
RBC: 4.12 MIL/uL (ref 3.87–5.11)
RDW: 19.9 % — ABNORMAL HIGH (ref 11.5–15.5)
WBC: 7.9 10*3/uL (ref 4.0–10.5)
nRBC: 0.4 % — ABNORMAL HIGH (ref 0.0–0.2)

## 2021-08-23 LAB — TROPONIN I (HIGH SENSITIVITY)
Troponin I (High Sensitivity): 18 ng/L — ABNORMAL HIGH (ref <18)
Troponin I (High Sensitivity): 17 ng/L (ref <18)

## 2021-08-23 LAB — BLOOD GAS, VENOUS
Acid-Base Excess: 4.5 mmol/L — ABNORMAL HIGH (ref 0.0–2.0)
Bicarbonate: 29.8 mmol/L — ABNORMAL HIGH (ref 20.0–28.0)
O2 Saturation: 57.1 %
Patient temperature: 37
pCO2, Ven: 46 mmHg (ref 44–60)
pH, Ven: 7.42 (ref 7.25–7.43)
pO2, Ven: 36 mmHg (ref 32–45)

## 2021-08-23 LAB — RESP PANEL BY RT-PCR (FLU A&B, COVID) ARPGX2
Influenza A by PCR: NEGATIVE
Influenza B by PCR: NEGATIVE
SARS Coronavirus 2 by RT PCR: NEGATIVE

## 2021-08-23 LAB — BRAIN NATRIURETIC PEPTIDE: B Natriuretic Peptide: 82.8 pg/mL (ref 0.0–100.0)

## 2021-08-23 LAB — CBG MONITORING, ED: Glucose-Capillary: 176 mg/dL — ABNORMAL HIGH (ref 70–99)

## 2021-08-23 MED ORDER — ENOXAPARIN SODIUM 40 MG/0.4ML IJ SOSY
40.0000 mg | PREFILLED_SYRINGE | INTRAMUSCULAR | Status: DC
  Administered 2021-08-23 – 2021-08-28 (×6): 40 mg via SUBCUTANEOUS
  Filled 2021-08-23 (×6): qty 0.4

## 2021-08-23 MED ORDER — POTASSIUM CHLORIDE CRYS ER 20 MEQ PO TBCR
40.0000 meq | EXTENDED_RELEASE_TABLET | Freq: Once | ORAL | Status: AC
  Administered 2021-08-23: 40 meq via ORAL
  Filled 2021-08-23: qty 2

## 2021-08-23 MED ORDER — ALBUTEROL SULFATE (2.5 MG/3ML) 0.083% IN NEBU: 2.5000 mg | INHALATION_SOLUTION | RESPIRATORY_TRACT | Status: DC | PRN

## 2021-08-23 MED ORDER — BUDESONIDE 0.25 MG/2ML IN SUSP
0.2500 mg | Freq: Two times a day (BID) | RESPIRATORY_TRACT | Status: DC
  Administered 2021-08-23 – 2021-08-27 (×8): 0.25 mg via RESPIRATORY_TRACT
  Filled 2021-08-23 (×8): qty 2

## 2021-08-23 MED ORDER — IPRATROPIUM BROMIDE 0.02 % IN SOLN
0.5000 mg | Freq: Once | RESPIRATORY_TRACT | Status: AC
  Administered 2021-08-23: 0.5 mg via RESPIRATORY_TRACT
  Filled 2021-08-23: qty 2.5

## 2021-08-23 MED ORDER — ACETAMINOPHEN 650 MG RE SUPP: 650.0000 mg | Freq: Four times a day (QID) | RECTAL | Status: DC | PRN

## 2021-08-23 MED ORDER — DOXYCYCLINE HYCLATE 100 MG PO TABS
100.0000 mg | ORAL_TABLET | Freq: Two times a day (BID) | ORAL | Status: DC
  Administered 2021-08-23 – 2021-08-25 (×4): 100 mg via ORAL
  Filled 2021-08-23 (×4): qty 1

## 2021-08-23 MED ORDER — ACETAMINOPHEN 325 MG PO TABS
650.0000 mg | ORAL_TABLET | Freq: Four times a day (QID) | ORAL | Status: DC | PRN
  Administered 2021-08-25 – 2021-08-27 (×2): 650 mg via ORAL
  Filled 2021-08-23 (×2): qty 2

## 2021-08-23 MED ORDER — IPRATROPIUM-ALBUTEROL 0.5-2.5 (3) MG/3ML IN SOLN
3.0000 mL | Freq: Three times a day (TID) | RESPIRATORY_TRACT | Status: DC
  Administered 2021-08-23: 3 mL via RESPIRATORY_TRACT
  Filled 2021-08-23: qty 3

## 2021-08-23 MED ORDER — GUAIFENESIN ER 600 MG PO TB12
600.0000 mg | ORAL_TABLET | Freq: Two times a day (BID) | ORAL | Status: DC
  Administered 2021-08-23 – 2021-08-29 (×12): 600 mg via ORAL
  Filled 2021-08-23 (×12): qty 1

## 2021-08-23 MED ORDER — INSULIN ASPART 100 UNIT/ML IJ SOLN
0.0000 [IU] | Freq: Every day | INTRAMUSCULAR | Status: DC
  Filled 2021-08-23: qty 0.05

## 2021-08-23 MED ORDER — METHYLPREDNISOLONE SODIUM SUCC 125 MG IJ SOLR
60.0000 mg | Freq: Two times a day (BID) | INTRAMUSCULAR | Status: DC
  Administered 2021-08-24 – 2021-08-26 (×5): 60 mg via INTRAVENOUS
  Filled 2021-08-23 (×5): qty 2

## 2021-08-23 MED ORDER — METHYLPREDNISOLONE SODIUM SUCC 125 MG IJ SOLR
125.0000 mg | Freq: Once | INTRAMUSCULAR | Status: AC
  Administered 2021-08-23: 125 mg via INTRAVENOUS
  Filled 2021-08-23: qty 2

## 2021-08-23 MED ORDER — ALBUTEROL SULFATE (2.5 MG/3ML) 0.083% IN NEBU
5.0000 mg | INHALATION_SOLUTION | Freq: Once | RESPIRATORY_TRACT | Status: AC
  Administered 2021-08-23: 5 mg via RESPIRATORY_TRACT
  Filled 2021-08-23: qty 6

## 2021-08-23 MED ORDER — INSULIN ASPART 100 UNIT/ML IJ SOLN
0.0000 [IU] | Freq: Three times a day (TID) | INTRAMUSCULAR | Status: DC
  Administered 2021-08-24: 2 [IU] via SUBCUTANEOUS
  Administered 2021-08-24 – 2021-08-26 (×5): 1 [IU] via SUBCUTANEOUS
  Administered 2021-08-26: 2 [IU] via SUBCUTANEOUS
  Administered 2021-08-26 – 2021-08-27 (×2): 1 [IU] via SUBCUTANEOUS
  Administered 2021-08-27: 3 [IU] via SUBCUTANEOUS
  Administered 2021-08-28: 2 [IU] via SUBCUTANEOUS
  Filled 2021-08-23: qty 0.09

## 2021-08-23 MED ORDER — POTASSIUM CHLORIDE 10 MEQ/100ML IV SOLN
10.0000 meq | INTRAVENOUS | Status: AC
  Administered 2021-08-23 (×2): 10 meq via INTRAVENOUS
  Filled 2021-08-23 (×2): qty 100

## 2021-08-23 NOTE — ED Triage Notes (Signed)
BIBA ?Per EMS: Pt coming from home w/ c/o SHOB x 2 days & dry cough  ?Hx asthma  ?No relief with breathing trx  ?[place] 3 L 98%  ?92% RA  ?150/82 ?90 HR   ?

## 2021-08-23 NOTE — Assessment & Plan Note (Addendum)
Blood pressure seems to be relatively stable, currently on hydralazine, bisoprolol, losartan and Aldactone. ?Blood pressure ranging 108/50 to 145/66. ?No changes made to her medication. ? ?

## 2021-08-23 NOTE — Assessment & Plan Note (Addendum)
Repleted orally now resolved. ?

## 2021-08-23 NOTE — Assessment & Plan Note (Addendum)
Currently on long-acting insulin plus sliding scale, blood glucose has remained relatively stable while on steroids. ?

## 2021-08-23 NOTE — H&P (Signed)
History and Physical    Jayline Kilburg OVF:[id number] DOB: 1951-03-07 DOA: 08/23/2021  DOS: the patient was seen and examined on 08/23/2021  PCP: Benito Mccreedy, MD   Patient coming from: Home  I have personally briefly reviewed patient's old medical records in [place]  Patient is a 71 year old female with a past medical history of hypertension, hyperlipidemia, PVD, noninsulin-dependent type 2 diabetes, CAD, COPD, chronic anemia, depression, anxiety, GERD, sleep apnea not on CPAP, history of substance abuse presented to the ED complaining of shortness of breath and cough.  In the ED, not hypoxic at rest but sats dropped to 85% on room air with ambulation.  Wheezing on exam.  No fever or leukocytosis.  Hemoglobin 8.0, stable.  Potassium 2.9.  VBG without evidence of hypercapnia.  High-sensitivity troponin 18 > 17.  BNP normal.  COVID and influenza PCR negative.  Chest x-ray showing hazy opacities in the lung bases favored to reflect artifact from positioning. Patient was given bronchodilator treatments, Solu-Medrol 125 mg, and potassium supplementation.  Patient states for the past 2.5 days she is having increasing shortness of breath, both with exertion and even at rest.  She is coughing a lot but not able to bring up mucus.  Having runny nose.  She is using albuterol inhaler as needed and DuoNeb 3 times a day.  States today her daughter thought that she was having a lot of difficulty breathing and called EMS.  Patient states she was previously smoking 1 pack of cigarettes per day but has now cut down to 0.25 packs/day.   Review of Systems:  Review of Systems  All other systems reviewed and are negative.  Past Medical History:  Diagnosis Date   Anemia 12/26/2015   Anxiety    Arthritis    Blood transfusion without reported diagnosis    Chronic pain    resolved per pt 04/12/20   COPD (chronic obstructive pulmonary disease) (HCC)    Coronary artery disease    Depression     Diabetes mellitus without complication (HCC)    type 2   GERD (gastroesophageal reflux disease)    Heart murmur    since birth; Echo 02/18/19: LVEF 84%, grade 1 diastolic dysfunction, mild AS (mean grad 12 mmHg), trace MR/PR, mild TR, PASP 32 mmHg     Hyperlipemia    Hypertension    PVD (peripheral vascular disease) ([place])    right SFA stent 02/11/17 by Dr. Einar Gip   Sleep apnea    does not use cpap   Wears glasses     Past Surgical History:  Procedure Laterality Date   ABDOMINAL HYSTERECTOMY     CARDIAC CATHETERIZATION     CATARACT EXTRACTION     CESAREAN SECTION     COLONOSCOPY N/A 01/08/2013   Procedure: COLONOSCOPY;  Surgeon: Beryle Beams, MD;  Location: [facility];  Service: Endoscopy;  Laterality: N/A;   COLONOSCOPY N/A 12/28/2015   Procedure: COLONOSCOPY;  Surgeon: Carol Ada, MD;  Location: [place];  Service: Endoscopy;  Laterality: N/A;   COLONOSCOPY WITH PROPOFOL N/A 10/05/2020   Procedure: COLONOSCOPY WITH PROPOFOL;  Surgeon: Carol Ada, MD;  Location: [facility];  Service: Endoscopy;  Laterality: N/A;   ENTEROSCOPY N/A 12/28/2015   Procedure: ENTEROSCOPY;  Surgeon: Carol Ada, MD;  Location: [place];  Service: Endoscopy;  Laterality: N/A;   ENTEROSCOPY N/A 02/20/2018   Procedure: ENTEROSCOPY;  Surgeon: Carol Ada, MD;  Location: [facility];  Service: Endoscopy;  Laterality: N/A;   ENTEROSCOPY N/A 03/27/2018  Procedure: ENTEROSCOPY;  Surgeon: Carol Ada, MD;  [facility]: [facility];  Service: Endoscopy;  Laterality: N/A;   ENTEROSCOPY N/A 10/05/2020   Procedure: ENTEROSCOPY;  Surgeon: Carol Ada, MD;  Location: [facility];  Service: Endoscopy;  Laterality: N/A;   ENTEROSCOPY N/A 05/11/2021   Procedure: ENTEROSCOPY;  Surgeon: Carol Ada, MD;  Location: [facility];  Service: Endoscopy;  Laterality: N/A;   ESOPHAGOGASTRODUODENOSCOPY N/A 01/08/2013   Procedure: ESOPHAGOGASTRODUODENOSCOPY (EGD);  Surgeon: Beryle Beams, MD;  Location: [facility];  Service: Endoscopy;  Laterality: N/A;   EYE SURGERY     HAND SURGERY     HEMOSTASIS CLIP PLACEMENT  05/11/2021   Procedure: HEMOSTASIS CLIP PLACEMENT;  Surgeon: Carol Ada, MD;  Location: [facility];  Service: Endoscopy;;   HOT HEMOSTASIS N/A 12/28/2015   Procedure: HOT HEMOSTASIS (ARGON PLASMA COAGULATION/BICAP);  Surgeon: Carol Ada, MD;  Location: [facility]COPY;  Service: Endoscopy;  Laterality: N/A;   HOT HEMOSTASIS N/A 02/20/2018   Procedure: HOT HEMOSTASIS (ARGON PLASMA COAGULATION/BICAP);  Surgeon: Carol Ada, MD;  Location: [facility];  Service: Endoscopy;  Laterality: N/A;   HOT HEMOSTASIS N/A 03/27/2018   Procedure: HOT HEMOSTASIS (ARGON PLASMA COAGULATION/BICAP);  Surgeon: Carol Ada, MD;  Location: [facility];  Service: Endoscopy;  Laterality: N/A;   HOT HEMOSTASIS N/A 10/05/2020   Procedure: HOT HEMOSTASIS (ARGON PLASMA COAGULATION/BICAP);  Surgeon: Carol Ada, MD;  Location: [facility];  Service: Endoscopy;  Laterality: N/A;   HOT HEMOSTASIS N/A 05/11/2021   Procedure: HOT HEMOSTASIS (ARGON PLASMA COAGULATION/BICAP);  Surgeon: Carol Ada, MD;  Location: [facility];  Service: Endoscopy;  Laterality: N/A;   LEFT HEART CATHETERIZATION WITH CORONARY ANGIOGRAM N/A 03/09/2013   Procedure: LEFT HEART CATHETERIZATION WITH CORONARY ANGIOGRAM;  Surgeon: Laverda Page, MD;  Location: [facility] CATH LAB;  Service: Cardiovascular;  Laterality: N/A;   LOWER EXTREMITY ANGIOGRAM N/A 12/29/2012   Procedure: LOWER EXTREMITY ANGIOGRAM;  Surgeon: Laverda Page, MD;  Location: [facility] CATH LAB;  Service: Cardiovascular;  Laterality: N/A;   LOWER EXTREMITY ANGIOGRAM N/A 10/05/2013   Procedure: LOWER EXTREMITY ANGIOGRAM;  Surgeon: Laverda Page, MD;  Location: [facility] CATH LAB;  Service: Cardiovascular;  Laterality: N/A;   LOWER EXTREMITY ANGIOGRAPHY N/A 02/11/2017   Procedure: Lower Extremity Angiography;  Surgeon: Adrian Prows, MD;  Location: [place] CV LAB;  Service:  Cardiovascular;  Laterality: N/A;   PERIPHERAL VASCULAR BALLOON ANGIOPLASTY  02/11/2017   Procedure: PERIPHERAL VASCULAR BALLOON ANGIOPLASTY;  Surgeon: Adrian Prows, MD;  [place] [facility];  Service: Cardiovascular;;  Right SFA   PERIPHERAL VASCULAR INTERVENTION Right 02/11/2017   Procedure: PERIPHERAL VASCULAR INTERVENTION;  Surgeon: Adrian Prows, MD;  Location: [place] CV LAB;  Service: Cardiovascular;  Laterality: Right;  Rt SFA   POLYPECTOMY  10/05/2020   Procedure: POLYPECTOMY;  Surgeon: Carol Ada, MD;  Location: [facility];  Service: Endoscopy;;   stent in right leg  12/29/2012   for blood clot, Dr. Nadyne Coombes   TOTAL KNEE ARTHROPLASTY Left 04/18/2020   TOTAL KNEE ARTHROPLASTY Left 04/18/2020   Procedure: LEFT TOTAL KNEE ARTHROPLASTY;  Surgeon: Meredith Pel, MD;  Location: [place];  Service: Orthopedics;  Laterality: Left;   TYMPANOMASTOIDECTOMY Left 03/08/2014   Procedure: TYMPANOMASTOIDECTOMY LEFT;  Surgeon: Ascencion Dike, MD;  Location: [place];  Service: ENT;  Laterality: Left;   TYMPANOMASTOIDECTOMY  2015   UTERINE FIBROID SURGERY       reports that she has quit smoking. Her smoking use included cigarettes. She has a 20.50 pack-year smoking  history. She has never used smokeless tobacco. She reports current alcohol use. She reports current drug use. Drugs: Marijuana and Cocaine.  No Known Allergies  Family History  Problem Relation Age of Onset   Diabetes Mother    Hypertension Mother    Heart disease Mother    Heart disease Father    Hypertension Father    Hypertension Sister    Hypertension Brother    Diabetes Brother    Hypertension Sister    Hypertension Brother    Diabetes Brother    Hypertension Brother    Hypertension Brother    Hypertension Brother     Prior to Admission medications   Medication Sig Start Date End Date Taking? Authorizing Provider  albuterol (PROVENTIL HFA;VENTOLIN HFA) 108 (90 Base) MCG/ACT inhaler Inhale 1-2  puffs into the lungs every 6 (six) hours as needed for wheezing or shortness of breath.    [provider]  amLODipine (NORVASC) 10 MG tablet Take 10 mg by mouth daily.  07/06/19   [provider]  ASPIRIN LOW DOSE 81 MG EC tablet TAKE 1 TABLET(81 MG) BY MOUTH DAILY 06/30/20   Magnant, Charles L, PA-C  atorvastatin (LIPITOR) 80 MG tablet Take 80 mg by mouth daily. 08/08/20   [provider]  cilostazol (PLETAL) 50 MG tablet Take 1 tablet (50 mg total) by mouth 2 (two) times daily. 08/14/21 09/13/21  [name], [name], DO  ezetimibe (ZETIA) 10 MG tablet Take 1 tablet (10 mg total) by mouth daily. 08/14/21 11/12/21  [name], [name], DO  ferrous sulfate 325 (65 FE) MG tablet Take 325 mg by mouth daily.    [provider]  gabapentin (NEURONTIN) 600 MG tablet Take 1 tablet (600 mg total) by mouth at bedtime. Patient taking differently: Take 600 mg by mouth 2 (two) times daily. 10/02/17   Hyatt, Max T, DPM  hydrALAZINE (APRESOLINE) 100 MG tablet TAKE 1 TABLET BY MOUTH THREE TIMES DAILY 11/27/20   Adrian Prows, MD  ipratropium-albuterol (DUONEB) 0.5-2.5 (3) MG/3ML SOLN Take 3 mLs by nebulization 3 (three) times daily. 05/12/21 08/14/21  Donne Hazel, MD  losartan (COZAAR) 100 MG tablet Take 100 mg by mouth daily. 08/29/20   [provider]  metFORMIN (GLUCOPHAGE) 500 MG tablet Take 0.5 tablets (250 mg total) by mouth 2 (two) times daily with a meal. 02/13/17   Adrian Prows, MD  nitroGLYCERIN (NITROSTAT) 0.4 MG SL tablet Place 1 tablet (0.4 mg total) under the tongue every 5 (five) minutes as needed for chest pain. 01/13/19 08/14/21  Miquel Dunn, NP  pantoprazole (PROTONIX) 40 MG tablet Take 40 mg by mouth daily. 08/23/20   [provider]  spironolactone (ALDACTONE) 50 MG tablet TAKE 1 TABLET(50 MG) BY MOUTH DAILY 03/09/21   Rex Kras, DO    Physical Exam: Vitals:   08/23/21 1815 08/23/21 1845 08/23/21 1933 08/23/21 1935  BP: (!) 154/70  138/74   Pulse: 67 77 77    Resp: (!) 24 (!) 21 18   Temp:      TempSrc:      SpO2: 100% 98% 98%   Weight:    95.7 kg  Height:    5' (1.524 m)    Physical Exam Vitals and nursing note reviewed.  Constitutional:      General: She is not in acute distress. HENT:     Head: Normocephalic and atraumatic.  Eyes:     Extraocular Movements: Extraocular movements intact.     Conjunctiva/sclera: Conjunctivae normal.  Cardiovascular:  Rate and Rhythm: Normal rate and regular rhythm.     Pulses: Normal pulses.  Pulmonary:     Breath sounds: No rales.     Comments: Mild wheezing Slightly tachypneic Abdominal:     General: Bowel sounds are normal. There is no distension.     Palpations: Abdomen is soft.     Tenderness: There is no abdominal tenderness.  Musculoskeletal:     Cervical back: Normal range of motion and neck supple.     Comments: +2 bilateral pedal edema  Skin:    General: Skin is warm and dry.  Neurological:     General: No focal deficit present.     Mental Status: She is alert and oriented to person, place, and time.     Labs on Admission: I have personally reviewed following labs and imaging studies  CBC: Recent Labs  Lab 08/23/21 1716  WBC 7.9  NEUTROABS 5.6  HGB 8.0*  HCT 29.7*  MCV 72.1*  PLT 299*   Basic Metabolic Panel: Recent Labs  Lab 08/23/21 1716  NA 140  K 2.9*  CL 106  CO2 27  GLUCOSE 99  BUN 16  CREATININE 0.81  CALCIUM 8.7*   GFR: Estimated Creatinine Clearance: 66 mL/min (by C-G formula based on SCr of 0.81 mg/dL). Liver Function Tests: Recent Labs  Lab 08/23/21 1716  AST 16  ALT 10  ALKPHOS 70  BILITOT 0.2*  PROT 6.8  ALBUMIN 3.4*   No results for input(s): LIPASE, AMYLASE in the last 168 hours. No results for input(s): AMMONIA in the last 168 hours. Coagulation Profile: No results for input(s): INR, PROTIME in the last 168 hours. Cardiac Enzymes: No results for input(s): CKTOTAL, CKMB, CKMBINDEX, TROPONINI in the last 168 hours. BNP (last 3  results) No results for input(s): PROBNP in the last 8760 hours. HbA1C: No results for input(s): HGBA1C in the last 72 hours. CBG: No results for input(s): GLUCAP in the last 168 hours. Lipid Profile: No results for input(s): CHOL, HDL, LDLCALC, TRIG, CHOLHDL, LDLDIRECT in the last 72 hours. Thyroid Function Tests: No results for input(s): TSH, T4TOTAL, FREET4, T3FREE, THYROIDAB in the last 72 hours. Anemia Panel: No results for input(s): VITAMINB12, FOLATE, FERRITIN, TIBC, IRON, RETICCTPCT in the last 72 hours. Urine analysis:    Component Value Date/Time   COLORURINE YELLOW 04/12/2020 1351   APPEARANCEUR HAZY (A) 04/12/2020 1351   LABSPEC 1.023 04/12/2020 1351   PHURINE 5.0 04/12/2020 1351   GLUCOSEU NEGATIVE 04/12/2020 1351   HGBUR NEGATIVE 04/12/2020 1351   BILIRUBINUR NEGATIVE 04/12/2020 1351   KETONESUR 5 (A) 04/12/2020 1351   PROTEINUR NEGATIVE 04/12/2020 1351   UROBILINOGEN 0.2 04/03/2013 1106   NITRITE NEGATIVE 04/12/2020 1351   LEUKOCYTESUR NEGATIVE 04/12/2020 1351    Radiological Exams on Admission: I have personally reviewed images DG Chest Port 1 View  Result Date: 08/23/2021 CLINICAL DATA:  Two days of shortness of breath and dry cough. EXAM: PORTABLE CHEST 1 VIEW COMPARISON:  May 09, 2021 FINDINGS: Similar cardiomegaly. Aortic atherosclerosis. Hazy opacities in lung bases are favored to reflect positioning. No visible pleural effusion or pneumothorax. No acute osseous abnormality. IMPRESSION: 1. Similar cardiomegaly. 2. Hazy opacities in lung bases are favored to reflect artifact from positioning. If continued clinical concern consider repeat examination with upright frontal and lateral chest radiograph. Electronically Signed   By: Dahlia Bailiff M.D.   On: 08/23/2021 17:40    EKG: I have personally reviewed EKG: Sinus rhythm, borderline shortened PR interval.  No  acute ischemic changes.  Assessment/Plan Principal Problem:   Acute exacerbation of chronic  obstructive pulmonary disease (COPD) (HCC) Active Problems:   Hypokalemia   HTN (hypertension)   PVD (peripheral vascular disease) (HCC)   Chronic anemia   Type 2 diabetes mellitus (HCC)   HLD (hyperlipidemia)    Assessment and Plan: * Acute exacerbation of chronic obstructive pulmonary disease (COPD) ([place]) Acute hypoxemic respiratory failure Not hypoxic at rest but sats dropped to 85% on room air with ambulation.  She was placed on 4 L supplemental oxygen for comfort and currently maintaining sats in the 90s but does become slightly tachypneic and dyspneic with minimal movement.  VBG without evidence of hypercapnia.  She was given Solu-Medrol 125 mg and bronchodilator treatments in the ED.  Continues to have mild wheezing.  Chest x-ray showing hazy opacities in the lung bases favored to reflect artifact from positioning.  No fever or leukocytosis.  COVID and influenza PCR negative.  BNP normal. -Solu-Medrol 60 mg every 12 hours, DuoNeb every 8 hours, Pulmicort neb twice daily, albuterol neb every 2 hours as needed, doxycycline, and Mucinex.  Pulmonary hygiene.  Continue supplemental oxygen, wean as tolerated.  She is smoking 0.25 packs of cigarettes daily, counseled on smoking cessation and offered nicotine patch but patient declines.  Hypokalemia -Potassium replacement given in the ED.  Repeat labs to check potassium level and continue to replace if low.  Check magnesium level and replace if low.  HLD (hyperlipidemia) -Continue home medications after pharmacy med rec is done.  Type 2 diabetes mellitus (HCC) Well-controlled -A1c 5.8 on 05/09/2021. -Sliding scale insulin sensitive ACHS.  Hold metformin.  Chronic anemia Hemoglobin 8.0, stable.  No signs of active bleeding.  Patient is not endorsing any symptoms of GI bleed.  PVD (peripheral vascular disease) ([place]) -Continue home medications after pharmacy med rec is done  HTN (hypertension) Stable. -Continue home medications after  pharmacy med rec is done.   DVT prophylaxis: Lovenox Code Status: Full Code (discussed with the patient) Family Communication: No family available at this time. Admission status: Inpatient, Telemetry bed It is my clinical opinion that admission to INPATIENT is reasonable and necessary because of the expectation that this patient will require hospital care that crosses at least 2 midnights to treat this condition based on the medical complexity of the problems presented.  Given the aforementioned information, the predictability of an adverse outcome is felt to be significant.   Shela Leff, MD [facility] 08/23/2021, 10:00 PM

## 2021-08-23 NOTE — Assessment & Plan Note (Addendum)
Patient on aspirin and statin therapy.  ?

## 2021-08-23 NOTE — Assessment & Plan Note (Addendum)
Acute hypoxemic respiratory failure ?Restarted on oxygen supplementation to keep saturations greater 90%. ?She was started on steroids and antibiotics completed course of antibiotics in house. ?She was transitioned to oral steroids she will continue steroid taper as an outpatient. ?She will go home on oxygen, be evaluated by her PCP in 2 weeks. ? ?

## 2021-08-23 NOTE — Subjective & Objective (Addendum)
Patient is a 71 year old female with a past medical history of hypertension, hyperlipidemia, PVD, noninsulin-dependent type 2 diabetes, CAD, COPD, chronic anemia, depression, anxiety, GERD, sleep apnea not on CPAP, history of substance abuse presented to the ED complaining of shortness of breath and cough.  In the ED, not hypoxic at rest but sats dropped to 85% on room air with ambulation.  Wheezing on exam.  No fever or leukocytosis.  Hemoglobin 8.0, stable.  Potassium 2.9.  VBG without evidence of hypercapnia.  High-sensitivity troponin 18 > 17.  BNP normal.  COVID and influenza PCR negative.  Chest x-ray showing hazy opacities in the lung bases favored to reflect artifact from positioning. Patient was given bronchodilator treatments, Solu-Medrol 125 mg, and potassium supplementation. ? ?Patient states for the past 2.5 days she is having increasing shortness of breath, both with exertion and even at rest.  She is coughing a lot but not able to bring up mucus.  Having runny nose.  She is using albuterol inhaler as needed and DuoNeb 3 times a day.  States today her daughter thought that she was having a lot of difficulty breathing and called EMS.  Patient states she was previously smoking 1 pack of cigarettes per day but has now cut down to 0.25 packs/day. ?

## 2021-08-23 NOTE — Assessment & Plan Note (Addendum)
On ezetimibe and statin therapy  ?

## 2021-08-23 NOTE — Assessment & Plan Note (Addendum)
Hemoglobin relatively stable.  ?

## 2021-08-23 NOTE — ED Provider Notes (Signed)
[facility] DEPT Provider Note   CSN: [id number] Arrival date & time: 08/23/21  1704     History  Chief Complaint  Patient presents with   Shortness of Breath    [name] is a 71 y.o. female history of COPD, anemia, diabetes, here presenting with shortness of breath.  Patient has underlying sleep apnea.  Patient states that she has been having progressive worsening shortness of breath for the last 4 to 5 days.  She states that she gets very short of breath at night.  She was supposed to use a CPAP but has not been using it for the last several years.  Patient states that she also gets very short of breath with minimal exertion.  Patient states that she was admitted for COPD exacerbation with similar symptoms last year.  Patient has a nonproductive cough as well.  Denies any fevers.  Denies any leg swelling or history of blood clots or heart failure.   The history is provided by the patient.      Home Medications Prior to Admission medications   Medication Sig Start Date End Date Taking? Authorizing Provider  albuterol (PROVENTIL HFA;VENTOLIN HFA) 108 (90 Base) MCG/ACT inhaler Inhale 1-2 puffs into the lungs every 6 (six) hours as needed for wheezing or shortness of breath.    [provider]  amLODipine (NORVASC) 10 MG tablet Take 10 mg by mouth daily.  07/06/19   [provider]  ASPIRIN LOW DOSE 81 MG EC tablet TAKE 1 TABLET(81 MG) BY MOUTH DAILY 06/30/20   Magnant, Charles L, PA-C  atorvastatin (LIPITOR) 80 MG tablet Take 80 mg by mouth daily. 08/08/20   [provider]  cilostazol (PLETAL) 50 MG tablet Take 1 tablet (50 mg total) by mouth 2 (two) times daily. 08/14/21 09/13/21  [name], [name], DO  ezetimibe (ZETIA) 10 MG tablet Take 1 tablet (10 mg total) by mouth daily. 08/14/21 11/12/21  [name], [name], DO  ferrous sulfate 325 (65 FE) MG tablet Take 325 mg by mouth daily.    [provider]  gabapentin (NEURONTIN)  600 MG tablet Take 1 tablet (600 mg total) by mouth at bedtime. Patient taking differently: Take 600 mg by mouth 2 (two) times daily. 10/02/17   Hyatt, Max T, DPM  hydrALAZINE (APRESOLINE) 100 MG tablet TAKE 1 TABLET BY MOUTH THREE TIMES DAILY 11/27/20   Adrian Prows, MD  ipratropium-albuterol (DUONEB) 0.5-2.5 (3) MG/3ML SOLN Take 3 mLs by nebulization 3 (three) times daily. 05/12/21 08/14/21  Donne Hazel, MD  losartan (COZAAR) 100 MG tablet Take 100 mg by mouth daily. 08/29/20   [provider]  metFORMIN (GLUCOPHAGE) 500 MG tablet Take 0.5 tablets (250 mg total) by mouth 2 (two) times daily with a meal. 02/13/17   Adrian Prows, MD  nitroGLYCERIN (NITROSTAT) 0.4 MG SL tablet Place 1 tablet (0.4 mg total) under the tongue every 5 (five) minutes as needed for chest pain. 01/13/19 08/14/21  Miquel Dunn, NP  pantoprazole (PROTONIX) 40 MG tablet Take 40 mg by mouth daily. 08/23/20   [provider]  spironolactone (ALDACTONE) 50 MG tablet TAKE 1 TABLET(50 MG) BY MOUTH DAILY 03/09/21   [name], [name], DO      Allergies    Patient has no known allergies.    Review of Systems   Review of Systems  Respiratory:  Positive for shortness of breath.   All other systems reviewed and are negative.  Physical Exam Updated Vital Signs BP 138/74 (  BP Location: Left Arm)    Pulse 77    Temp 98.5 F (36.9 C) (Oral)    Resp 18    Ht 5' (1.524 m)    Wt 95.7 kg    LMP  (LMP Unknown)    SpO2 98%    BMI 41.21 kg/m  Physical Exam Vitals and nursing note reviewed.  Constitutional:      Comments: Tachypneic  HENT:     Head: Normocephalic.     Mouth/Throat:     Mouth: Mucous membranes are moist.  Eyes:     Extraocular Movements: Extraocular movements intact.     Pupils: Pupils are equal, round, and reactive to light.  Cardiovascular:     Rate and Rhythm: Normal rate and regular rhythm.  Pulmonary:     Comments: Tachypneic, minimal wheezing throughout.  No retractions Musculoskeletal:         General: Normal range of motion.     Cervical back: Normal range of motion and neck supple.     Comments: 1+ edema  Skin:    General: Skin is warm.     Capillary Refill: Capillary refill takes less than 2 seconds.  Neurological:     General: No focal deficit present.     Mental Status: She is oriented to person, place, and time.  Psychiatric:        Mood and Affect: Mood normal.        Behavior: Behavior normal.    ED Results / Procedures / Treatments   Labs (all labs ordered are listed, but only abnormal results are displayed) Labs Reviewed  CBC WITH DIFFERENTIAL/PLATELET - Abnormal; Notable for the following components:      Result Value   Hemoglobin 8.0 (*)    HCT 29.7 (*)    MCV 72.1 (*)    MCH 19.4 (*)    MCHC 26.9 (*)    RDW 19.9 (*)    Platelets 414 (*)    nRBC 0.4 (*)    Abs Immature Granulocytes 0.08 (*)    All other components within normal limits  COMPREHENSIVE METABOLIC PANEL - Abnormal; Notable for the following components:   Potassium 2.9 (*)    Calcium 8.7 (*)    Albumin 3.4 (*)    Total Bilirubin 0.2 (*)    All other components within normal limits  BLOOD GAS, VENOUS - Abnormal; Notable for the following components:   Bicarbonate 29.8 (*)    Acid-Base Excess 4.5 (*)    All other components within normal limits  TROPONIN I (HIGH SENSITIVITY) - Abnormal; Notable for the following components:   Troponin I (High Sensitivity) 18 (*)    All other components within normal limits  RESP PANEL BY RT-PCR (FLU A&B, COVID) ARPGX2  BRAIN NATRIURETIC PEPTIDE  TROPONIN I (HIGH SENSITIVITY)    EKG EKG Interpretation  Date/Time:  Thursday August 23 2021 17:41:54 EST Ventricular Rate:  72 PR Interval:  117 QRS Duration: 85 QT Interval:  388 QTC Calculation: 425 R Axis:   67 Text Interpretation: Sinus rhythm Atrial premature complex Borderline short PR interval No significant change since last tracing Confirmed by Wandra Arthurs [phone]) on 08/23/2021 5:43:33  PM  Radiology DG Chest Port 1 View  Result Date: 08/23/2021 CLINICAL DATA:  Two days of shortness of breath and dry cough. EXAM: PORTABLE CHEST 1 VIEW COMPARISON:  May 09, 2021 FINDINGS: Similar cardiomegaly. Aortic atherosclerosis. Hazy opacities in lung bases are favored to reflect positioning. No visible pleural effusion or pneumothorax.  No acute osseous abnormality. IMPRESSION: 1. Similar cardiomegaly. 2. Hazy opacities in lung bases are favored to reflect artifact from positioning. If continued clinical concern consider repeat examination with upright frontal and lateral chest radiograph. Electronically Signed   By: Dahlia Bailiff M.D.   On: 08/23/2021 17:40    Procedures Procedures    CRITICAL CARE Performed by: Wandra Arthurs   Total critical care time: 30 minutes  Critical care time was exclusive of separately billable procedures and treating other patients.  Critical care was necessary to treat or prevent imminent or life-threatening deterioration.  Critical care was time spent personally by me on the following activities: development of treatment plan with patient and/or surrogate as well as nursing, discussions with consultants, evaluation of patient's response to treatment, examination of patient, obtaining history from patient or surrogate, ordering and performing treatments and interventions, ordering and review of laboratory studies, ordering and review of radiographic studies, pulse oximetry and re-evaluation of patient's condition.   Medications Ordered in ED Medications  potassium chloride 10 mEq in 100 mL IVPB (10 mEq Intravenous New Bag/Given 08/23/21 1933)  methylPREDNISolone sodium succinate (SOLU-MEDROL) 125 mg/2 mL injection 125 mg (125 mg Intravenous Given 08/23/21 1735)  albuterol (PROVENTIL) (2.5 MG/3ML) 0.083% nebulizer solution 5 mg (5 mg Nebulization Given 08/23/21 1735)  ipratropium (ATROVENT) nebulizer solution 0.5 mg (0.5 mg Nebulization Given 08/23/21 1735)   potassium chloride SA (KLOR-CON M) CR tablet 40 mEq (40 mEq Oral Given 08/23/21 1932)    ED Course/ Medical Decision Making/ A&P                           Medical Decision Making [name] is a 71 y.o. female here presenting with shortness of breath.  I think likely COPD exacerbation. Patient has a history of COPD.  Also consider sleep apnea as patient has a history of sleep apnea and is not wearing a CPAP at night.  Patient has minimal pitting edema so consider CHF as well.  Plan to get CBC and CMP and BNP and VBG and chest x-ray.  Will give albuterol and Solu-Medrol and patient likely need admission.   7:39 PM Patient labs show hemoglobin 8 which is baseline.  Potassium is 2.9 and supplemented.  Chest x-ray showed cardiomegaly but her BNP is normal. Patient tried to walk to the bathroom and desatted to 85% and was put back on oxygen.  VBG is reassuring. Patient was given albuterol and Solu-Medrol.  Patient will be admitted for hypoxia from COPD exacerbation.  Problems Addressed: COPD exacerbation ([place]): acute illness or injury Hypoxia: acute illness or injury  Amount and/or Complexity of Data Reviewed External Data Reviewed: notes. Labs: ordered. Decision-making details documented in ED Course. Radiology: ordered and independent interpretation performed. Decision-making details documented in ED Course. ECG/medicine tests: ordered.  Risk Prescription drug management. Decision regarding hospitalization.   Final Clinical Impression(s) / ED Diagnoses Final diagnoses:  COPD exacerbation ([place])  Hypoxia    Rx / DC Orders ED Discharge Orders     None         Drenda Freeze, MD 08/23/21 1941

## 2021-08-24 LAB — RESPIRATORY PANEL BY PCR

## 2021-08-24 LAB — POTASSIUM: Potassium: 3.6 mmol/L (ref 3.5–5.1)

## 2021-08-24 LAB — MAGNESIUM: Magnesium: 1.5 mg/dL — ABNORMAL LOW (ref 1.7–2.4)

## 2021-08-24 LAB — GLUCOSE, CAPILLARY
Glucose-Capillary: 121 mg/dL — ABNORMAL HIGH (ref 70–99)
Glucose-Capillary: 134 mg/dL — ABNORMAL HIGH (ref 70–99)

## 2021-08-24 LAB — CBG MONITORING, ED
Glucose-Capillary: 144 mg/dL — ABNORMAL HIGH (ref 70–99)
Glucose-Capillary: 179 mg/dL — ABNORMAL HIGH (ref 70–99)

## 2021-08-24 MED ORDER — MAGNESIUM SULFATE 2 GM/50ML IV SOLN
2.0000 g | Freq: Once | INTRAVENOUS | Status: AC
  Administered 2021-08-24: 2 g via INTRAVENOUS
  Filled 2021-08-24: qty 50

## 2021-08-24 MED ORDER — SODIUM CHLORIDE 0.9 % IV SOLN: INTRAVENOUS | Status: DC | PRN

## 2021-08-24 MED ORDER — IPRATROPIUM-ALBUTEROL 0.5-2.5 (3) MG/3ML IN SOLN
3.0000 mL | Freq: Three times a day (TID) | RESPIRATORY_TRACT | Status: DC
  Administered 2021-08-24 – 2021-08-27 (×10): 3 mL via RESPIRATORY_TRACT
  Filled 2021-08-24 (×9): qty 3

## 2021-08-24 MED ORDER — ORAL CARE MOUTH RINSE
15.0000 mL | Freq: Two times a day (BID) | OROMUCOSAL | Status: DC
  Administered 2021-08-25 – 2021-08-29 (×7): 15 mL via OROMUCOSAL

## 2021-08-24 NOTE — Progress Notes (Signed)
Spoke with patient, regarding her [place] results. Patient was still in pain and wanted you to know she is currently in the hospital, and wanted you to know in case you wanted to stop by there to see her.

## 2021-08-24 NOTE — Progress Notes (Signed)
She is at [facility] for shortness of breath evaluation. I would complete the acute issues first.   Dr. Terri Skains

## 2021-08-24 NOTE — Progress Notes (Signed)
?  Transition of Care (TOC) Screening Note ? ? ?Patient Details  ?Name: [name] ?Date of Birth: 11-Jul-1950 ? ? ?Transition of Care [facility]) CM/SW Contact:    ?Dessa Phi, RN ?Phone Number: ?08/24/2021, 1:54 PM ? ? ? ?Transition of Care Department [facility]) has reviewed patient and no TOC needs have been identified at this time. We will continue to monitor patient advancement through interdisciplinary progression rounds. If new patient transition needs arise, please place a TOC consult. ?  ?

## 2021-08-24 NOTE — Progress Notes (Signed)
?PROGRESS NOTE ? ? ? ?[name]  DQQ:[id number] DOB: Sep 03, 1950 DOA: 08/23/2021 ?PCP: Benito Mccreedy, MD  ? ? ? ?Brief Narrative:  ? ?Patient is a 71 year old female with a past medical history of hypertension, hyperlipidemia, PVD, noninsulin-dependent type 2 diabetes, CAD, COPD ? ?Subjective: ? ?Continues to have dry cough, tachypnea , on 3 L oxygen supplement , baseline not on O2 at home  ?denies chest pain ?No fever ? ?Assessment & Plan: ? Principal Problem: ?  Acute exacerbation of chronic obstructive pulmonary disease (COPD) ([place]) ?Active Problems: ?  Hypokalemia ?  HTN (hypertension) ?  PVD (peripheral vascular disease) ([place]) ?  Chronic anemia ?  Type 2 diabetes mellitus ([place]) ?  HLD (hyperlipidemia) ? ? ? ?Assessment and Plan: ? ?COPD exacerbation/acute hypoxic respiratory failure ?Sats dropped to 85 upon ambulation on room air ?COVID screening negative/flu screen negative ?Reports sick contact, check respiratory viral panel ?Continue IV steroid, nebulizer, Mucinex ?Smoking cessation education provided ? ?Hypokalemia/hypomagnesemia ?Remain low, continue replace, repeat in the morning ? ?Microcytic anemia ?Report history of multiple colonoscopy to remove polyps ?Hemoglobin appears at baseline ?Denies overt bleeding ?Report feeling progressive short of breath in the last few month ?We will check iron level ?FOBT ? ? ?Noninsulin-dependent type 2 diabetes ?Hyperlipidemia ?Hypertension ?Stable on current regimen ? ?Mild pedal edema ?Seen by cardiology recently ?Had negative venous Doppler ?Elevate legs ? ?Class III obesity: Body mass index is 41.21 kg/m?Marland Kitchen.  ? ?. ? ?I have Reviewed nursing notes, Vitals, pain scores, I/o's, Lab results and  imaging results since pt's last encounter, details please see discussion above  ?I ordered the following labs:  ?Unresulted Labs (From admission, onward)  ? ?  Start     Ordered  ? 08/25/21 0500  CBC with Differential/Platelet  Tomorrow morning,   R       ? 08/24/21  1227  ? 08/25/21 1941  Basic metabolic panel  Tomorrow morning,   R       ? 08/24/21 1227  ? 08/25/21 0500  Magnesium  Tomorrow morning,   STAT       ? 08/24/21 1227  ? 08/24/21 1226  Respiratory (~20 pathogens) panel by PCR  (Respiratory panel by PCR (~20 pathogens, ~24 hr TAT)  w precautions)  Once,   R       ? 08/24/21 1225  ? ?  ?  ? ?  ? ? ? ?DVT prophylaxis: enoxaparin (LOVENOX) injection 40 mg Start: 08/23/21 2200 ? ? ?Code Status:   Code Status: Full Code ? ?Family Communication: Patient ?Disposition:  ? ?Status is: Inpatient ? ?Dispo: The patient is from: Home ?             Anticipated d/c is to: Home ?             Anticipated d/c date is: 24 to 48 hours ? ?Antimicrobials:   ?Anti-infectives (From admission, onward)  ? ? Start     Dose/Rate Route Frequency Ordered Stop  ? 08/23/21 2200  doxycycline (VIBRA-TABS) tablet 100 mg       ? 100 mg Oral Every 12 hours 08/23/21 2156 08/28/21 2159  ? ?  ? ? ? ? ? ? ?Objective: ?Vitals:  ? 08/24/21 0600 08/24/21 0700 08/24/21 0900 08/24/21 1100  ?BP: 133/74 115/84 (!) 179/81 (!) 167/91  ?Pulse: 63 66 64 74  ?Resp: 18 15 (!) 23 (!) 26  ?Temp:      ?TempSrc:      ?SpO2: 100% 98% 98% 98%  ?  Weight:      ?Height:      ? ? ?Intake/Output Summary (Last 24 hours) at 08/24/2021 1227 ?Last data filed at 08/23/2021 2150 ?Gross per 24 hour  ?Intake 100 ml  ?Output --  ?Net 100 ml  ? ?Filed Weights  ? 08/23/21 1935  ?Weight: 95.7 kg  ? ? ?Examination: ? ?General exam: alert, awake, communicative, intermittent dry cough ?Respiratory system: Mild diffuse bilateral wheezing, tachypnea, no accessory muscle use  ?Cardiovascular system:  RRR, frequent PACs ?Gastrointestinal system: Abdomen is nondistended, soft and nontender.  Normal bowel sounds heard. ?Central nervous system: Alert and oriented. No focal neurological deficits. ?Extremities: Mild pedal edema, worse on the right side ?Skin: No rashes, lesions or ulcers ?Psychiatry: Judgement and insight appear normal. Mood & affect  appropriate.  ? ? ? ?Data Reviewed: I have personally reviewed  labs and visualized  imaging studies since the last encounter and formulate the plan  ? ? ? ? ? ? ?Scheduled Meds: ? budesonide (PULMICORT) nebulizer solution  0.25 mg Nebulization BID  ? doxycycline  100 mg Oral Q12H  ? enoxaparin (LOVENOX) injection  40 mg Subcutaneous Q24H  ? guaiFENesin  600 mg Oral BID  ? insulin aspart  0-5 Units Subcutaneous QHS  ? insulin aspart  0-9 Units Subcutaneous TID WC  ? ipratropium-albuterol  3 mL Nebulization TID  ? methylPREDNISolone (SOLU-MEDROL) injection  60 mg Intravenous Q12H  ? ?Continuous Infusions: ? magnesium sulfate bolus IVPB    ? ? ? LOS: 1 day  ? ? ?Carla Reasons, MD PhD FACP ?[facility] ? ?Available via Epic secure chat 7am-7pm for nonurgent issues ?Please page for urgent issues ?To page the attending provider between 7A-7P or the covering provider during after hours 7P-7A, please log into the web site [email or web address] and access using universal [place] password for that web site. If you do not have the password, please call the hospital operator. ? ? ? ?08/24/2021, 12:27 PM  ? ? ?

## 2021-08-25 LAB — BASIC METABOLIC PANEL
Anion gap: 7 (ref 5–15)
BUN: 20 mg/dL (ref 8–23)
CO2: 25 mmol/L (ref 22–32)
Calcium: 8.6 mg/dL — ABNORMAL LOW (ref 8.9–10.3)
Chloride: 105 mmol/L (ref 98–111)
Creatinine, Ser: 0.66 mg/dL (ref 0.44–1.00)
GFR, Estimated: >60 mL/min (ref >60)
Glucose, Bld: 141 mg/dL — ABNORMAL HIGH (ref 70–99)
Potassium: 4.1 mmol/L (ref 3.5–5.1)
Sodium: 137 mmol/L (ref 135–145)

## 2021-08-25 LAB — CBC WITH DIFFERENTIAL/PLATELET
Abs Immature Granulocytes: 0.06 10*3/uL (ref 0.00–0.07)
Basophils Absolute: 0 10*3/uL (ref 0.0–0.1)
Basophils Relative: 0 %
Eosinophils Absolute: 0 10*3/uL (ref 0.0–0.5)
Eosinophils Relative: 0 %
HCT: 30.9 % — ABNORMAL LOW (ref 36.0–46.0)
Hemoglobin: 7.9 g/dL — ABNORMAL LOW (ref 12.0–15.0)
Immature Granulocytes: 1 %
Lymphocytes Relative: 5 %
Lymphs Abs: 0.5 10*3/uL — ABNORMAL LOW (ref 0.7–4.0)
MCH: 19.1 pg — ABNORMAL LOW (ref 26.0–34.0)
MCHC: 25.6 g/dL — ABNORMAL LOW (ref 30.0–36.0)
MCV: 74.6 fL — ABNORMAL LOW (ref 80.0–100.0)
Monocytes Absolute: 0.3 10*3/uL (ref 0.1–1.0)
Monocytes Relative: 3 %
Neutro Abs: 8.8 10*3/uL — ABNORMAL HIGH (ref 1.7–7.7)
Neutrophils Relative %: 91 %
Platelets: 444 10*3/uL — ABNORMAL HIGH (ref 150–400)
RBC: 4.14 MIL/uL (ref 3.87–5.11)
RDW: 20.3 % — ABNORMAL HIGH (ref 11.5–15.5)
WBC: 9.7 10*3/uL (ref 4.0–10.5)
nRBC: 0.3 % — ABNORMAL HIGH (ref 0.0–0.2)

## 2021-08-25 LAB — TROPONIN I (HIGH SENSITIVITY)
Troponin I (High Sensitivity): 7 ng/L (ref <18)
Troponin I (High Sensitivity): 8 ng/L (ref <18)

## 2021-08-25 LAB — GLUCOSE, CAPILLARY
Glucose-Capillary: 142 mg/dL — ABNORMAL HIGH (ref 70–99)
Glucose-Capillary: 123 mg/dL — ABNORMAL HIGH (ref 70–99)
Glucose-Capillary: 132 mg/dL — ABNORMAL HIGH (ref 70–99)
Glucose-Capillary: 161 mg/dL — ABNORMAL HIGH (ref 70–99)

## 2021-08-25 LAB — FERRITIN: Ferritin: 10 ng/mL — ABNORMAL LOW (ref 11–307)

## 2021-08-25 LAB — VITAMIN B12: Vitamin B-12: 290 pg/mL (ref 180–914)

## 2021-08-25 LAB — IRON AND TIBC
Iron: 19 ug/dL — ABNORMAL LOW (ref 28–170)
Saturation Ratios: 4 % — ABNORMAL LOW (ref 10.4–31.8)
TIBC: 458 ug/dL — ABNORMAL HIGH (ref 250–450)
UIBC: 439 ug/dL

## 2021-08-25 LAB — MAGNESIUM: Magnesium: 2 mg/dL (ref 1.7–2.4)

## 2021-08-25 MED ORDER — CILOSTAZOL 100 MG PO TABS
50.0000 mg | ORAL_TABLET | Freq: Two times a day (BID) | ORAL | Status: DC
Start: 2021-08-25 — End: 2021-08-29
  Administered 2021-08-25 – 2021-08-29 (×9): 50 mg via ORAL
  Filled 2021-08-25 (×4): qty 0.5
  Filled 2021-08-25: qty 1
  Filled 2021-08-25 (×5): qty 0.5

## 2021-08-25 MED ORDER — NITROGLYCERIN 0.4 MG SL SUBL: 0.4000 mg | SUBLINGUAL_TABLET | SUBLINGUAL | Status: DC | PRN

## 2021-08-25 MED ORDER — EZETIMIBE 10 MG PO TABS
10.0000 mg | ORAL_TABLET | Freq: Every day | ORAL | Status: DC
  Administered 2021-08-25 – 2021-08-29 (×5): 10 mg via ORAL
  Filled 2021-08-25 (×5): qty 1

## 2021-08-25 MED ORDER — AMOXICILLIN-POT CLAVULANATE 875-125 MG PO TABS
1.0000 | ORAL_TABLET | Freq: Two times a day (BID) | ORAL | Status: DC
  Administered 2021-08-25 – 2021-08-27 (×5): 1 via ORAL
  Filled 2021-08-25 (×5): qty 1

## 2021-08-25 MED ORDER — ASPIRIN EC 81 MG PO TBEC
81.0000 mg | DELAYED_RELEASE_TABLET | Freq: Every day | ORAL | Status: DC
  Administered 2021-08-25 – 2021-08-29 (×5): 81 mg via ORAL
  Filled 2021-08-25 (×5): qty 1

## 2021-08-25 MED ORDER — CALCIUM CARBONATE ANTACID 500 MG PO CHEW: 1.0000 | CHEWABLE_TABLET | Freq: Three times a day (TID) | ORAL | Status: DC | PRN

## 2021-08-25 MED ORDER — PANTOPRAZOLE SODIUM 40 MG PO TBEC
40.0000 mg | DELAYED_RELEASE_TABLET | Freq: Every day | ORAL | Status: DC
  Administered 2021-08-25 – 2021-08-29 (×5): 40 mg via ORAL
  Filled 2021-08-25 (×5): qty 1

## 2021-08-25 MED ORDER — SENNOSIDES-DOCUSATE SODIUM 8.6-50 MG PO TABS
1.0000 | ORAL_TABLET | Freq: Two times a day (BID) | ORAL | Status: AC
  Administered 2021-08-25 – 2021-08-26 (×4): 1 via ORAL
  Filled 2021-08-25 (×4): qty 1

## 2021-08-25 MED ORDER — FERROUS SULFATE 325 (65 FE) MG PO TABS
325.0000 mg | ORAL_TABLET | Freq: Every day | ORAL | Status: DC
  Administered 2021-08-25: 325 mg via ORAL
  Filled 2021-08-25: qty 1

## 2021-08-25 MED ORDER — ATORVASTATIN CALCIUM 40 MG PO TABS
80.0000 mg | ORAL_TABLET | Freq: Every day | ORAL | Status: DC
  Administered 2021-08-25 – 2021-08-29 (×5): 80 mg via ORAL
  Filled 2021-08-25 (×6): qty 2

## 2021-08-25 NOTE — Progress Notes (Signed)
?PROGRESS NOTE ? ? ? ?[name]  AST:[id number] DOB: 1950-07-13 DOA: 08/23/2021 ?PCP: Benito Mccreedy, MD  ? ? ? ?Brief Narrative:  ? ?Patient is a 71 year old female with a past medical history of hypertension, hyperlipidemia, PVD, noninsulin-dependent type 2 diabetes, CAD, COPD ? ?Subjective: ? ?Continues to have dry cough, tachypnea , on 3 L oxygen supplement , baseline not on O2 at home  ?Still wheezing, reports breathing treatment helps but " it comes back" ?Reported left side chest pain this am , now resolved , reports having intermittent left side chest pain at home about once every two months, nitroglycerin helps the pain ?No edema ?No fever ?Also reports significant indigestion  ? ?Assessment & Plan: ? Principal Problem: ?  Acute exacerbation of chronic obstructive pulmonary disease (COPD) ([place]) ?Active Problems: ?  Hypokalemia ?  HTN (hypertension) ?  PVD (peripheral vascular disease) ([place]) ?  Chronic anemia ?  Type 2 diabetes mellitus ([place]) ?  HLD (hyperlipidemia) ? ? ? ?Assessment and Plan: ? ?COPD exacerbation/acute hypoxic respiratory failure ?Sats dropped to 85 upon ambulation on room air ?COVID screening negative/flu screen negative ?Reports sick contact, respiratory viral panel negative  ?Continue IV steroid, nebulizer, Mucinex, change doxy to augmentin due to c/o indigestion ?Smoking cessation education provided ? wean oxygen as able, do home O2 eval ? ?Hypokalemia/hypomagnesemia ?Replaced, improving, repeat in the morning ? ?Frequent PACs ?-Keep K above 4, mag above 2 ?Check TSH ?Consider low-dose Cardizem ?Follow-up with cardiology ? ? ?Microcytic anemia/iron deficiency anemia ?Report history of multiple colonoscopy to remove polyps/history of GI AVM, follows with GI Dr. Benson [place] ?Hemoglobin appears at baseline ?Denies overt bleeding, reports stool was brown, FOBt pending  ?Report feeling progressive short of breath in the last few month ?Will discuss with her regarding iv iron   ? ? ?Noninsulin-dependent type 2 diabetes ?Hyperlipidemia ?Hypertension ?PAD ?Stable on current regimen, ? ?Mild pedal edema ?Seen by cardiology recently ?Had negative venous Doppler ?Elevate legs, no significant edema today ? ?Class III obesity: Body mass index is 41.21 kg/m?Marland Kitchen.  ? ?. ? ?I have Reviewed nursing notes, Vitals, pain scores, I/o's, Lab results and  imaging results since pt's last encounter, details please see discussion above  ?I ordered the following labs:  ?Unresulted Labs (From admission, onward)  ? ?  Start     Ordered  ? 08/26/21 0500  CBC with Differential/Platelet  Tomorrow morning,   R       ? 08/25/21 1227  ? 08/26/21 9892  Basic metabolic panel  Tomorrow morning,   R       ? 08/25/21 1227  ? 08/26/21 0500  Magnesium  Tomorrow morning,   STAT       ? 08/25/21 1227  ? 08/26/21 0500  TSH  Tomorrow morning,   R       ? 08/25/21 1227  ? Unscheduled  Occult blood card to lab, stool  As needed,   R     ? 08/24/21 1719  ? Pending  Respiratory (~20 pathogens) panel by PCR  (Respiratory panel by PCR (~20 pathogens, ~24 hr TAT)  w precautions)  Once,   R       ? Pending  ? ?  ?  ? ?  ? ? ? ?DVT prophylaxis: enoxaparin (LOVENOX) injection 40 mg Start: 08/23/21 2200 ? ? ?Code Status:   Code Status: Full Code ? ?Family Communication: Patient ?Disposition:  ? ?Status is: Inpatient ? ?Dispo: The patient is from: Home ?  Anticipated d/c is to: Home ?             Anticipated d/c date is: 24 to 48 hours, need to wean oxygen, do home O2 eval ? ?Antimicrobials:   ?Anti-infectives (From admission, onward)  ? ? Start     Dose/Rate Route Frequency Ordered Stop  ? 08/25/21 1315  amoxicillin-clavulanate (AUGMENTIN) 875-125 MG per tablet 1 tablet       ? 1 tablet Oral Every 12 hours 08/25/21 1225    ? 08/23/21 2200  doxycycline (VIBRA-TABS) tablet 100 mg  Status:  Discontinued       ? 100 mg Oral Every 12 hours 08/23/21 2156 08/25/21 1223  ? ?  ? ? ? ? ? ? ?Objective: ?Vitals:  ? 08/25/21 0142 08/25/21 0145  08/25/21 7169 08/25/21 6789  ?BP: (!) 175/91 (!) 152/91 (!) 158/77   ?Pulse: 62 61 63   ?Resp: 20  (!) 21   ?Temp: 97.9 ?F (36.6 ?C)  98.1 ?F (36.7 ?C)   ?TempSrc: Oral  Oral   ?SpO2: 99%  98% 94%  ?Weight:      ?Height:      ? ? ?Intake/Output Summary (Last 24 hours) at 08/25/2021 1227 ?Last data filed at 08/25/2021 0930 ?Gross per 24 hour  ?Intake 782.26 ml  ?Output 750 ml  ?Net 32.26 ml  ? ?Filed Weights  ? 08/23/21 1935  ?Weight: 95.7 kg  ? ? ?Examination: ? ?General exam: alert, awake, communicative, intermittent dry cough ?Respiratory system: Diminished, no wheezing heard this morning, tachypnea has improved, no accessory muscle use  ?Cardiovascular system:  RRR, frequent PACs ?Gastrointestinal system: Abdomen is nondistended, soft and nontender.  Normal bowel sounds heard. ?Central nervous system: Alert and oriented. No focal neurological deficits. ?Extremities: Mild right-sided pedal edema appear has resolved ?Skin: No rashes, lesions or ulcers ?Psychiatry: Judgement and insight appear normal. Mood & affect appropriate.  ? ? ? ?Data Reviewed: I have personally reviewed  labs and visualized  imaging studies since the last encounter and formulate the plan  ? ? ? ? ? ? ?Scheduled Meds: ? amoxicillin-clavulanate  1 tablet Oral Q12H  ? aspirin EC  81 mg Oral Daily  ? atorvastatin  80 mg Oral Daily  ? budesonide (PULMICORT) nebulizer solution  0.25 mg Nebulization BID  ? cilostazol  50 mg Oral BID  ? enoxaparin (LOVENOX) injection  40 mg Subcutaneous Q24H  ? ezetimibe  10 mg Oral Daily  ? ferrous sulfate  325 mg Oral Daily  ? guaiFENesin  600 mg Oral BID  ? insulin aspart  0-5 Units Subcutaneous QHS  ? insulin aspart  0-9 Units Subcutaneous TID WC  ? ipratropium-albuterol  3 mL Nebulization TID  ? mouth rinse  15 mL Mouth Rinse BID  ? methylPREDNISolone (SOLU-MEDROL) injection  60 mg Intravenous Q12H  ? pantoprazole  40 mg Oral Daily  ? senna-docusate  1 tablet Oral BID  ? ?Continuous Infusions: ? sodium chloride 10  mL/hr at 08/24/21 1336  ? ? ? LOS: 2 days  ? ? ?Florencia Reasons, MD PhD FACP ?[facility] ? ?Available via Epic secure chat 7am-7pm for nonurgent issues ?Please page for urgent issues ?To page the attending provider between 7A-7P or the covering provider during after hours 7P-7A, please log into the web site [email or web address] and access using universal [place] password for that web site. If you do not have the password, please call the hospital operator. ? ? ? ?08/25/2021, 12:27 PM  ? ? ?

## 2021-08-25 NOTE — Plan of Care (Signed)
?  Problem: Education: ?Goal: Knowledge of disease or condition will improve ?Outcome: Progressing ?  ?Problem: Activity: ?Goal: Ability to tolerate increased activity will improve ?Outcome: Progressing ?  ?Problem: Respiratory: ?Goal: Ability to maintain a clear airway will improve ?Outcome: Progressing ?Goal: Levels of oxygenation will improve ?Outcome: Progressing ?  ?

## 2021-08-25 NOTE — Progress Notes (Signed)
Assumed care from previous RN. Agree with earlier assessment. No changes or c/o at present.Eulas Post, RN  ?

## 2021-08-26 LAB — BASIC METABOLIC PANEL
Anion gap: 6 (ref 5–15)
BUN: 23 mg/dL (ref 8–23)
CO2: 27 mmol/L (ref 22–32)
Calcium: 8.4 mg/dL — ABNORMAL LOW (ref 8.9–10.3)
Chloride: 105 mmol/L (ref 98–111)
Creatinine, Ser: 0.76 mg/dL (ref 0.44–1.00)
GFR, Estimated: >60 mL/min (ref >60)
Glucose, Bld: 153 mg/dL — ABNORMAL HIGH (ref 70–99)
Potassium: 4.1 mmol/L (ref 3.5–5.1)
Sodium: 138 mmol/L (ref 135–145)

## 2021-08-26 LAB — CBC WITH DIFFERENTIAL/PLATELET
Abs Immature Granulocytes: 0.09 10*3/uL — ABNORMAL HIGH (ref 0.00–0.07)
Basophils Absolute: 0 10*3/uL (ref 0.0–0.1)
Basophils Relative: 0 %
Eosinophils Absolute: 0 10*3/uL (ref 0.0–0.5)
Eosinophils Relative: 0 %
HCT: 30.6 % — ABNORMAL LOW (ref 36.0–46.0)
Hemoglobin: 7.9 g/dL — ABNORMAL LOW (ref 12.0–15.0)
Immature Granulocytes: 1 %
Lymphocytes Relative: 5 %
Lymphs Abs: 0.5 10*3/uL — ABNORMAL LOW (ref 0.7–4.0)
MCH: 19.3 pg — ABNORMAL LOW (ref 26.0–34.0)
MCHC: 25.8 g/dL — ABNORMAL LOW (ref 30.0–36.0)
MCV: 74.8 fL — ABNORMAL LOW (ref 80.0–100.0)
Monocytes Absolute: 0.4 10*3/uL (ref 0.1–1.0)
Monocytes Relative: 4 %
Neutro Abs: 9.3 10*3/uL — ABNORMAL HIGH (ref 1.7–7.7)
Neutrophils Relative %: 90 %
Platelets: 494 10*3/uL — ABNORMAL HIGH (ref 150–400)
RBC: 4.09 MIL/uL (ref 3.87–5.11)
RDW: 20 % — ABNORMAL HIGH (ref 11.5–15.5)
WBC: 10.3 10*3/uL (ref 4.0–10.5)
nRBC: 0.9 % — ABNORMAL HIGH (ref 0.0–0.2)

## 2021-08-26 LAB — GLUCOSE, CAPILLARY
Glucose-Capillary: 131 mg/dL — ABNORMAL HIGH (ref 70–99)
Glucose-Capillary: 137 mg/dL — ABNORMAL HIGH (ref 70–99)
Glucose-Capillary: 156 mg/dL — ABNORMAL HIGH (ref 70–99)
Glucose-Capillary: 158 mg/dL — ABNORMAL HIGH (ref 70–99)

## 2021-08-26 LAB — OCCULT BLOOD X 1 CARD TO LAB, STOOL: Fecal Occult Bld: POSITIVE — AB

## 2021-08-26 LAB — MAGNESIUM: Magnesium: 1.7 mg/dL (ref 1.7–2.4)

## 2021-08-26 LAB — TSH: TSH: 0.315 u[IU]/mL — ABNORMAL LOW (ref 0.350–4.500)

## 2021-08-26 MED ORDER — PREDNISONE 50 MG PO TABS
50.0000 mg | ORAL_TABLET | Freq: Every day | ORAL | Status: DC
  Administered 2021-08-27: 50 mg via ORAL
  Filled 2021-08-26: qty 1

## 2021-08-26 MED ORDER — LOSARTAN POTASSIUM 25 MG PO TABS
25.0000 mg | ORAL_TABLET | Freq: Every day | ORAL | Status: DC
  Administered 2021-08-27 – 2021-08-29 (×3): 25 mg via ORAL
  Filled 2021-08-26 (×3): qty 1

## 2021-08-26 MED ORDER — HYDRALAZINE HCL 25 MG PO TABS
25.0000 mg | ORAL_TABLET | Freq: Three times a day (TID) | ORAL | Status: DC
Start: 2021-08-26 — End: 2021-08-29
  Administered 2021-08-26 – 2021-08-29 (×8): 25 mg via ORAL
  Filled 2021-08-26 (×8): qty 1

## 2021-08-26 MED ORDER — FLUTICASONE PROPIONATE 50 MCG/ACT NA SUSP
2.0000 | Freq: Every day | NASAL | Status: DC
  Administered 2021-08-26 – 2021-08-29 (×4): 2 via NASAL
  Filled 2021-08-26: qty 16

## 2021-08-26 MED ORDER — SPIRONOLACTONE 25 MG PO TABS
50.0000 mg | ORAL_TABLET | Freq: Every day | ORAL | Status: DC
  Administered 2021-08-26 – 2021-08-29 (×4): 50 mg via ORAL
  Filled 2021-08-26 (×4): qty 2

## 2021-08-26 MED ORDER — MAGNESIUM SULFATE 2 GM/50ML IV SOLN
2.0000 g | Freq: Once | INTRAVENOUS | Status: AC
  Administered 2021-08-26: 2 g via INTRAVENOUS
  Filled 2021-08-26: qty 50

## 2021-08-26 MED ORDER — BISOPROLOL FUMARATE 5 MG PO TABS
5.0000 mg | ORAL_TABLET | Freq: Every day | ORAL | Status: DC
  Administered 2021-08-26 – 2021-08-29 (×4): 5 mg via ORAL
  Filled 2021-08-26 (×4): qty 1

## 2021-08-26 MED ORDER — SODIUM CHLORIDE 0.9 % IV SOLN
510.0000 mg | Freq: Once | INTRAVENOUS | Status: AC
  Administered 2021-08-26: 510 mg via INTRAVENOUS
  Filled 2021-08-26: qty 17

## 2021-08-26 NOTE — Progress Notes (Addendum)
?PROGRESS NOTE ? ? ? ?[name]  GTX:[id number] DOB: March 30, 1951 DOA: 08/23/2021 ?PCP: Benito Mccreedy, MD  ? ? ? ?Brief Narrative:  ? ?Patient is a 71 year old female with a past medical history of hypertension, hyperlipidemia, PVD, noninsulin-dependent type 2 diabetes, CAD, COPD, ? she was brought in by ambulance ,Reports her daughter found her huffing and puffing at home ,  ? ?Subjective: ? ?Improving, less cough, currently no wheezing, off oxygen, on room air at rest ?Reports some leg edema ?When get up to the bathroom " no huffing and puffing " anymore.Marland Kitchen  ?No chest pain ?No fever ?Reports stool is dark today, denies ab pain ? ? ?Assessment & Plan: ? Principal Problem: ?  Acute exacerbation of chronic obstructive pulmonary disease (COPD) ([place]) ?Active Problems: ?  Hypokalemia ?  HTN (hypertension) ?  PVD (peripheral vascular disease) ([place]) ?  Chronic anemia ?  Type 2 diabetes mellitus ([place]) ?  HLD (hyperlipidemia) ? ? ? ?Assessment and Plan: ? ?COPD exacerbation/acute hypoxic respiratory failure ?-Sats dropped to 85 upon ambulation on room air ?-COVID screening negative/flu screen negative ?-Reports sick contact, respiratory viral panel negative  ?-improving, taper steroids, continue  nebulizer, Mucinex, augmentin  ?-Smoking cessation education provided ? -wean oxygen as able, do home O2 eval ? ?Hypokalemia, replaced and normalized ?Hypomagnesemia, remain low, continue to replace ?repeat in the morning ? ?Frequent PACs ?-Keep K above 4, mag above 2 ?-TSH 0.315, check free t4 ?-start low dose cardioselective bisoprolol in the setting of COPD ?-Follow-up with cardiology ? ? ?Microcytic anemia/iron deficiency anemia ?Report history of multiple colonoscopy to remove polyps/history of GI AVM, follows with GI Dr. Benson Norway ?Hemoglobin appears at baseline ?Reports stool is dark on 3/5, FOBT pending  collection ?Report feeling progressive short of breath in the last few month ?Will give iv iron x1 on  3/5 ? ?Hypertension ?Started low-dose bisoprolol this hospitalization for frequent PACs ?Continue spironolactone ?Reduce losartan and hydralazine ?Hold Norvasc ? ?Noninsulin-dependent type 2 diabetes ?Hyperlipidemia ?PAD ?Stable on current regimen, ? ?Mild pedal edema, R>L ?Seen by cardiology recently ?Had negative venous Doppler ?Elevate legs,  ?Resume spironolactone ? ?Class III obesity: Body mass index is 41.21 kg/m?Marland Kitchen.  ? ?. ? ?I have Reviewed nursing notes, Vitals, pain scores, I/o's, Lab results and  imaging results since pt's last encounter, details please see discussion above  ?I ordered the following labs:  ?Unresulted Labs (From admission, onward)  ? ?  Start     Ordered  ? 08/27/21 0500  T4, free  Tomorrow morning,   R       ?Question:  Specimen collection method  Answer:  Lab=Lab collect  ? 08/26/21 0924  ? 08/27/21 0500  CBC  Tomorrow morning,   R       ?Question:  Specimen collection method  Answer:  Lab=Lab collect  ? 08/26/21 0924  ? 08/27/21 2482  Basic metabolic panel  Tomorrow morning,   R       ?Question:  Specimen collection method  Answer:  Lab=Lab collect  ? 08/26/21 0924  ? 08/27/21 0500  Magnesium  Tomorrow morning,   R       ?Question:  Specimen collection method  Answer:  Lab=Lab collect  ? 08/26/21 0924  ? Unscheduled  Occult blood card to lab, stool  As needed,   R     ? 08/24/21 1719  ? Unscheduled  Occult blood card to lab, stool RN will collect  As needed,   R     ?  Question:  Specimen to be collected by:  Answer:  RN will collect  ? 08/26/21 0909  ? Unscheduled  Occult blood card to lab, stool RN will collect  As needed,   R     ?Question:  Specimen to be collected by:  Answer:  RN will collect  ? 08/26/21 1657  ? ?  ?  ? ?  ? ? ? ?DVT prophylaxis: enoxaparin (LOVENOX) injection 40 mg Start: 08/23/21 2200 ? ? ?Code Status:   Code Status: Full Code ? ?Family Communication: Patient ?Disposition:  ? ?Status is: Inpatient ? ?Dispo: The patient is from: Home ?             Anticipated d/c is  to: Home ?             Anticipated d/c date is: 24 to 48 hours, need to wean oxygen, do home O2 eval, monitor cbc, f/u on FOBT, may need in house GI consult if FOBt+, otherwise need outpatient gi follow up ? ?Antimicrobials:   ?Anti-infectives (From admission, onward)  ? ? Start     Dose/Rate Route Frequency Ordered Stop  ? 08/25/21 1315  amoxicillin-clavulanate (AUGMENTIN) 875-125 MG per tablet 1 tablet       ? 1 tablet Oral Every 12 hours 08/25/21 1225    ? 08/23/21 2200  doxycycline (VIBRA-TABS) tablet 100 mg  Status:  Discontinued       ? 100 mg Oral Every 12 hours 08/23/21 2156 08/25/21 1223  ? ?  ? ? ? ? ? ? ?Objective: ?Vitals:  ? 08/26/21 0902 08/26/21 1331 08/26/21 1332 08/26/21 1440  ?BP:  (!) 153/66    ?Pulse:  83 (!) 104   ?Resp:  20    ?Temp:  98.3 ?F (36.8 ?C)    ?TempSrc:  Oral    ?SpO2: 100% (!) 88% 97% 98%  ?Weight:      ?Height:      ? ? ?Intake/Output Summary (Last 24 hours) at 08/26/2021 1707 ?Last data filed at 08/26/2021 1500 ?Gross per 24 hour  ?Intake 299.13 ml  ?Output 200 ml  ?Net 99.13 ml  ? ?Filed Weights  ? 08/23/21 1935  ?Weight: 95.7 kg  ? ? ?Examination: ? ?General exam: alert, awake, communicative, intermittent dry cough ?Respiratory system: No wheezing heard this morning, improved aeration , slight tachypnea, no accessory muscle use ?Cardiovascular system:  RRR, frequent PACs ?Gastrointestinal system: Abdomen is nondistended, soft and nontender.  Normal bowel sounds heard. ?Central nervous system: Alert and oriented. No focal neurological deficits. ?Extremities: Mild right-sided pedal edema  ?Skin: No rashes, lesions or ulcers ?Psychiatry: Judgement and insight appear normal. Mood & affect appropriate.  ? ? ? ?Data Reviewed: I have personally reviewed  labs and visualized  imaging studies since the last encounter and formulate the plan  ? ? ? ? ? ? ?Scheduled Meds: ? amoxicillin-clavulanate  1 tablet Oral Q12H  ? aspirin EC  81 mg Oral Daily  ? atorvastatin  80 mg Oral Daily  ?  bisoprolol  5 mg Oral Daily  ? budesonide (PULMICORT) nebulizer solution  0.25 mg Nebulization BID  ? cilostazol  50 mg Oral BID  ? enoxaparin (LOVENOX) injection  40 mg Subcutaneous Q24H  ? ezetimibe  10 mg Oral Daily  ? fluticasone  2 spray Each Nare Daily  ? guaiFENesin  600 mg Oral BID  ? insulin aspart  0-5 Units Subcutaneous QHS  ? insulin aspart  0-9 Units Subcutaneous TID WC  ? ipratropium-albuterol  3 mL Nebulization TID  ? [START ON 08/27/2021] losartan  25 mg Oral Daily  ? mouth rinse  15 mL Mouth Rinse BID  ? pantoprazole  40 mg Oral Daily  ? [START ON 08/27/2021] predniSONE  50 mg Oral Q breakfast  ? senna-docusate  1 tablet Oral BID  ? spironolactone  50 mg Oral Daily  ? ?Continuous Infusions: ? sodium chloride 10 mL/hr at 08/24/21 1336  ? ? ? LOS: 3 days  ? ? ?Florencia Reasons, MD PhD FACP ?[facility] ? ?Available via Epic secure chat 7am-7pm for nonurgent issues ?Please page for urgent issues ?To page the attending provider between 7A-7P or the covering provider during after hours 7P-7A, please log into the web site [email or web address] and access using universal [place] password for that web site. If you do not have the password, please call the hospital operator. ? ? ? ?08/26/2021, 5:07 PM  ? ? ?

## 2021-08-27 DIAGNOSIS — I10 Essential (primary) hypertension: Secondary | ICD-10-CM

## 2021-08-27 DIAGNOSIS — D649 Anemia, unspecified: Secondary | ICD-10-CM

## 2021-08-27 DIAGNOSIS — E876 Hypokalemia: Secondary | ICD-10-CM

## 2021-08-27 DIAGNOSIS — E785 Hyperlipidemia, unspecified: Secondary | ICD-10-CM

## 2021-08-27 LAB — CBC
HCT: 30.4 % — ABNORMAL LOW (ref 36.0–46.0)
Hemoglobin: 7.7 g/dL — ABNORMAL LOW (ref 12.0–15.0)
MCH: 19.1 pg — ABNORMAL LOW (ref 26.0–34.0)
MCHC: 25.3 g/dL — ABNORMAL LOW (ref 30.0–36.0)
MCV: 75.2 fL — ABNORMAL LOW (ref 80.0–100.0)
Platelets: 493 10*3/uL — ABNORMAL HIGH (ref 150–400)
RBC: 4.04 MIL/uL (ref 3.87–5.11)
RDW: 20 % — ABNORMAL HIGH (ref 11.5–15.5)
WBC: 10.8 10*3/uL — ABNORMAL HIGH (ref 4.0–10.5)
nRBC: 1.6 % — ABNORMAL HIGH (ref 0.0–0.2)

## 2021-08-27 LAB — BASIC METABOLIC PANEL
Anion gap: 6 (ref 5–15)
BUN: 26 mg/dL — ABNORMAL HIGH (ref 8–23)
CO2: 28 mmol/L (ref 22–32)
Calcium: 8.7 mg/dL — ABNORMAL LOW (ref 8.9–10.3)
Chloride: 107 mmol/L (ref 98–111)
Creatinine, Ser: 0.79 mg/dL (ref 0.44–1.00)
GFR, Estimated: >60 mL/min (ref >60)
Glucose, Bld: 101 mg/dL — ABNORMAL HIGH (ref 70–99)
Potassium: 3.9 mmol/L (ref 3.5–5.1)
Sodium: 141 mmol/L (ref 135–145)

## 2021-08-27 LAB — GLUCOSE, CAPILLARY
Glucose-Capillary: 94 mg/dL (ref 70–99)
Glucose-Capillary: 146 mg/dL — ABNORMAL HIGH (ref 70–99)
Glucose-Capillary: 203 mg/dL — ABNORMAL HIGH (ref 70–99)
Glucose-Capillary: 120 mg/dL — ABNORMAL HIGH (ref 70–99)

## 2021-08-27 LAB — MAGNESIUM: Magnesium: 2 mg/dL (ref 1.7–2.4)

## 2021-08-27 LAB — T4, FREE: Free T4: 0.74 ng/dL (ref 0.61–1.12)

## 2021-08-27 MED ORDER — IPRATROPIUM-ALBUTEROL 0.5-2.5 (3) MG/3ML IN SOLN
3.0000 mL | Freq: Four times a day (QID) | RESPIRATORY_TRACT | Status: DC
  Administered 2021-08-27 – 2021-08-28 (×5): 3 mL via RESPIRATORY_TRACT
  Filled 2021-08-27 (×5): qty 3

## 2021-08-27 MED ORDER — MOMETASONE FURO-FORMOTEROL FUM 200-5 MCG/ACT IN AERO
2.0000 | INHALATION_SPRAY | Freq: Two times a day (BID) | RESPIRATORY_TRACT | Status: DC
  Administered 2021-08-28 – 2021-08-29 (×3): 2 via RESPIRATORY_TRACT
  Filled 2021-08-27: qty 8.8

## 2021-08-27 MED ORDER — IPRATROPIUM-ALBUTEROL 0.5-2.5 (3) MG/3ML IN SOLN: 3.0000 mL | Freq: Two times a day (BID) | RESPIRATORY_TRACT | Status: DC

## 2021-08-27 MED ORDER — METHYLPREDNISOLONE SODIUM SUCC 40 MG IJ SOLR
40.0000 mg | Freq: Two times a day (BID) | INTRAMUSCULAR | Status: DC
Start: 2021-08-27 — End: 2021-08-29
  Administered 2021-08-27 – 2021-08-28 (×3): 40 mg via INTRAVENOUS
  Filled 2021-08-27 (×3): qty 1

## 2021-08-27 NOTE — Hospital Course (Addendum)
71 yo female with the past medical history of hypertension, dyslipidemia, T2DM, chronic anemia, obesity and sleep apnea who presented with dyspnea.  On her initial physical examination her oxygen saturation was 85% on room air on ambulation, blood pressure 154/70, HR 67, RR24 and oxygen saturation 98% on supplemental 02 per [place]. Lungs with bilateral wheezing, and increase respiratory effort,, being treated for an acute COPD exacerbation with acute respiratory failure with hypoxia ?

## 2021-08-27 NOTE — Care Management Important Message (Signed)
Important Message ? ?Patient Details IM Letter given to the Patient. ?Name: [name] ?MRN: [id number] ?Date of Birth: 11/12/1950 ? ? ?Medicare Important Message Given:  Yes ? ? ? ? ?Kerin Salen ?08/27/2021, 11:52 AM ?

## 2021-08-27 NOTE — Progress Notes (Signed)
?   08/27/21 1200  ?Oxygen Therapy  ?SpO2 94 %  ?O2 Device Nasal Cannula  ?O2 Flow Rate (L/min) 2 L/min  ?Patient Activity (if Appropriate) Ambulating  ?Mobility  ?Activity Ambulated with assistance in hallway  ?Level of Assistance Modified independent, requires aide device or extra time  ?Assistive Device None  ?Distance Ambulated (ft) 150 ft  ?Activity Response Tolerated well  ?$Mobility charge 1 Mobility  ? ?Pt agreeable to mobilize this morning. Ambulated about 174ft in hall with 2L [place], tolerated well. No complaints. Left pt in chair, call bell at side. RN/NT notified of session.  ? ?Rudell Cobb. ?Mobility Specialist ?Acute Rehab Services ?Office: [phone] ? ? ?

## 2021-08-27 NOTE — Progress Notes (Signed)
?  Progress Note ? ? ?Patient: [name] RPR:[id number] DOB: 1951/04/25 DOA: 08/23/2021     4 ?DOS: the patient was seen and examined on 08/27/2021 ?  ?Brief hospital course: ?No notes on file ? ?Assessment and Plan: ?* Acute exacerbation of chronic obstructive pulmonary disease (COPD) ([place]) ?Acute hypoxemic respiratory failure ?Patient continue with dyspnea and not back to her baseline. ? ?Plan to continue with bronchodilator therapy. ?Systemic and inhaled corticosteroids. ?Oxymetry monitoring and supplemental 02 per [place] ?Airway clearing techniques with flutter valve and incentive spirometer.  ? ?Hypokalemia ?Electrolytes have been corrected. ?Today serum cr at 0,79 with K at 3,9 and serum bicarbonate at 28. ?Continue close follow up of electrolytes.  ? ?HTN (hypertension) ?Continue blood pressure control with hydralazine, bisoprolol, losartan and spironolactone.  ? ?PVD (peripheral vascular disease) ([place]) ?Patient on aspirin and statin therapy.  ? ?Chronic anemia ?Cell count has been stable.  ? ?Type 2 diabetes mellitus ([place]) ?Continue glucose cover and monitoring with insulin sliding scale. ?Patient is tolerating po well.  ? ?HLD (hyperlipidemia) ?On ezetimibe and statin therapy  ? ? ? ? ?  ? ?Subjective: patient continue to have dyspnea, no chest pain  ? ?Physical Exam: ?Vitals:  ? 08/27/21 0446 08/27/21 0806 08/27/21 1200 08/27/21 1345  ?BP: (!) 156/70   123/72  ?Pulse: (!) 55   (!) 53  ?Resp: 20   (!) 21  ?Temp: 98.2 ?F (36.8 ?C)   98.2 ?F (36.8 ?C)  ?TempSrc: Oral   Oral  ?SpO2: 100% 96% 94% 95%  ?Weight:      ?Height:      ? ?Neurology awake and alert ?ENT with no pallor ?Cardiovascular with S1 and S2 present and rhythmic ?Respiratory with prolonged expiratory phase with no wheezing or rhonchi ?Abdomen soft  ?Data Reviewed: ? ? ? ?Family Communication: no family at the bedside  ? ?Disposition: ?Status is: Inpatient ?Remains inpatient appropriate because: COPD exacerbation  ? Planned Discharge  Destination: Home ? ? ? ?Author: ?Tawni Millers, MD ?08/27/2021 6:47 PM ? ?For on call review [email or web address].  ?

## 2021-08-28 ENCOUNTER — Ambulatory Visit: Payer: Medicare Other | Admitting: Cardiology

## 2021-08-28 DIAGNOSIS — E119 Type 2 diabetes mellitus without complications: Secondary | ICD-10-CM

## 2021-08-28 DIAGNOSIS — I739 Peripheral vascular disease, unspecified: Secondary | ICD-10-CM

## 2021-08-28 LAB — BASIC METABOLIC PANEL
Anion gap: 5 (ref 5–15)
BUN: 18 mg/dL (ref 8–23)
CO2: 29 mmol/L (ref 22–32)
Calcium: 8.3 mg/dL — ABNORMAL LOW (ref 8.9–10.3)
Chloride: 104 mmol/L (ref 98–111)
Creatinine, Ser: 0.67 mg/dL (ref 0.44–1.00)
GFR, Estimated: >60 mL/min (ref >60)
Glucose, Bld: 131 mg/dL — ABNORMAL HIGH (ref 70–99)
Potassium: 3.6 mmol/L (ref 3.5–5.1)
Sodium: 138 mmol/L (ref 135–145)

## 2021-08-28 LAB — CBC
HCT: 30.9 % — ABNORMAL LOW (ref 36.0–46.0)
Hemoglobin: 8.1 g/dL — ABNORMAL LOW (ref 12.0–15.0)
MCH: 19.6 pg — ABNORMAL LOW (ref 26.0–34.0)
MCHC: 26.2 g/dL — ABNORMAL LOW (ref 30.0–36.0)
MCV: 74.6 fL — ABNORMAL LOW (ref 80.0–100.0)
Platelets: 522 10*3/uL — ABNORMAL HIGH (ref 150–400)
RBC: 4.14 MIL/uL (ref 3.87–5.11)
RDW: 20 % — ABNORMAL HIGH (ref 11.5–15.5)
WBC: 12.8 10*3/uL — ABNORMAL HIGH (ref 4.0–10.5)
nRBC: 2.7 % — ABNORMAL HIGH (ref 0.0–0.2)

## 2021-08-28 LAB — GLUCOSE, CAPILLARY
Glucose-Capillary: 114 mg/dL — ABNORMAL HIGH (ref 70–99)
Glucose-Capillary: 100 mg/dL — ABNORMAL HIGH (ref 70–99)
Glucose-Capillary: 163 mg/dL — ABNORMAL HIGH (ref 70–99)
Glucose-Capillary: 174 mg/dL — ABNORMAL HIGH (ref 70–99)

## 2021-08-28 MED ORDER — IPRATROPIUM-ALBUTEROL 0.5-2.5 (3) MG/3ML IN SOLN
3.0000 mL | Freq: Two times a day (BID) | RESPIRATORY_TRACT | Status: DC
  Administered 2021-08-29: 3 mL via RESPIRATORY_TRACT
  Filled 2021-08-28: qty 3

## 2021-08-28 NOTE — Progress Notes (Signed)
PT Cancellation Note ? ?Patient Details ?Name: [name] ?MRN: [id number] ?DOB: [birth year] ? ? ?Cancelled Treatment:    Reason Eval/Treat Not Completed: PT screened, no needs identified.  Secure chat with RN who reports she is mobilizing well. Will sign off. Thanks. ? ? ? ?Doreatha Massed, PT ?Acute Rehabilitation  ?Office: [phone] ?Pager: [phone] ? ?  ?

## 2021-08-28 NOTE — Progress Notes (Signed)
?  Progress Note ? ? ?Patient: [name] WUJ:[id number] DOB: 1950/12/28 DOA: 08/23/2021     5 ?DOS: the patient was seen and examined on 08/28/2021 ?  ?Brief hospital course: ?[name] was admitted to the hospital with the working diagnosis of COPD exacerbation.  ? ?71 yo female with the past medical history of hypertension, dyslipidemia, T2DM, chronic anemia, obesity and sleep apnea who presented with dyspnea. Reported 2.5 days of worsening dyspnea on exertion, to the point where she became symptomatic with minimal efforts. Positive cough and generalized weakness.  ?On her initial physical examination her oxygen saturation was 85% on room air on ambulation, blood pressure 154/70, HR 67, RR24 and oxygen saturation 98% on supplemental 02 per [place]. Lungs with bilateral wheezing, and increase respiratory effort, heart with S1 and S2 present and rhythmic, abdomen soft and positive non pitting bilateral lower extremity edema.  ? ?Chest radiograph with no infiltrates.  ? ?Assessment and Plan: ?* Acute exacerbation of chronic obstructive pulmonary disease (COPD) ([place]) ?Acute hypoxemic respiratory failure ?Slowly improving but not yet back to baseline. ? ?Her oxymetry is 99% on 2 L/min per [place] ?Positive oxygen desaturation on ambulation on room air.  ? ?On bronchodilator therapy. ?Continue with systemic (40 mg methylprednisolone bid) and inhaled corticosteroids. ?Oxymetry monitoring and supplemental 02 per [place] ?Airway clearing techniques with flutter valve and incentive spirometer.  ?Will need supplemental home 02 at discharge.  ? ?Echocardiogram from 2013 with preserved LV and RV systolic function but elevated pulmonary pressures. ?Check follow up echocardiogram, possible worsening pulmonary hypertension, core pulmonale.  ? ?Hypokalemia ?Stable renal function with serum cr at 0,67, K is 3,6 and serum bicarbonate at 29. ?Continue close monitoring of renal function and electrolytes.  ? ?HTN (hypertension) ?On hydralazine,  bisoprolol, losartan and spironolactone for blood pressure control.  ?Systolic blood pressure 956 to 150 range.  ? ?PVD (peripheral vascular disease) ([place]) ?Patient on aspirin and statin therapy.  ? ?Chronic anemia ?Cell count has been stable.  ? ?Type 2 diabetes mellitus ([place]) ?Continue glucose cover and monitoring with insulin sliding scale. ?Patient is tolerating po well. ?Fasting glucose this am is 131.  ? ?HLD (hyperlipidemia) ?On ezetimibe and statin therapy  ? ?Class 3 obesity (HCC) ?Calculated BMI is 41.2  ? ? ? ? ?  ? ?Subjective: dyspnea has been improving but not yet back to baseline, positive lower extremity edema when seating or standing  ? ?Physical Exam: ?Vitals:  ? 08/28/21 0807 08/28/21 1103 08/28/21 1358 08/28/21 1428  ?BP:   (!) 135/56   ?Pulse:   (!) 59   ?Resp:   18   ?Temp:   97.7 ?F (36.5 ?C)   ?TempSrc:   Oral   ?SpO2: (!) 81% 95% 99% 97%  ?Weight:      ?Height:      ? ?Neurology awake and alert ?ENT with no pallor ?Cardiovascular with S1 and S2 present and rhythmic, no gallops or murmurs ?No JVD ?Positive lower extremity edema + bilaterally pitting ?Respiratory with prolonged expiratory phase with no wheezing, scattered rales and no rhonchi ?Abdomen protuberant but not distended  ?Data Reviewed: ? ? ? ?Family Communication: no family at the bedside  ? ?Disposition: ?Status is: Inpatient ?Remains inpatient appropriate because: respiratory support  ? Planned Discharge Destination: Home ? ? ? ? ? ?Author: ?Tawni Millers, MD ?08/28/2021 3:31 PM ? ?For on call review [email or web address].  ?

## 2021-08-28 NOTE — Assessment & Plan Note (Signed)
Calculated BMI is 41.2  ?

## 2021-08-28 NOTE — Progress Notes (Signed)
Received report from I. Oraegbunam RN. No change in assessment. Continue plan of care. Ladarryl Wrage, Laurel Dimmer, RN  ?

## 2021-08-28 NOTE — Progress Notes (Signed)
SATURATION QUALIFICATIONS: (This note is used to comply with regulatory documentation for home oxygen) ? ?Patient Saturations on Room Air at Rest = 93% ? ?Patient Saturations on Room Air while Ambulating = 84% ? ?Patient Saturations on 2 Liters of oxygen while Ambulating = 95% ? ?Please briefly explain why patient needs home oxygen: oxygen sat dropped and stayed low during ambulation/activity. ?

## 2021-08-28 NOTE — TOC Progression Note (Addendum)
Transition of Care (TOC) - Progression Note  ? ? ?Patient Details  ?Name: [name] ?MRN: [id number] ?Date of Birth: Jul 17, 1950 ? ?Transition of Care (TOC) CM/SW Contact  ?Dessa Phi, RN ?Phone Number: ?08/28/2021, 2:27 PM ? ?Clinical Narrative:Patient qualifies for home 02 if needed can arrange.MD notified.   ?-3:37p-qualifies for home 02-[facility] aware.No PT/OT needs. ? ? ? ?Expected Discharge Plan: Home/Self Care ?Barriers to Discharge: Continued Medical Work up ? ?Expected Discharge Plan and Services ?Expected Discharge Plan: Home/Self Care ?  ?Discharge Planning Services: CM Consult ?Post Acute Care Choice:  (home 02) ?Living arrangements for the past 2 months: [place] ?                ?  ?  ?  ?  ?  ?  ?  ?  ?  ?  ? ? ?Social Determinants of Health (SDOH) Interventions ?  ? ?Readmission Risk Interventions ?Readmission Risk Prevention Plan 08/28/2021  ?Transportation Screening Complete  ?PCP or Specialist Appt within 3-5 Days Complete  ?[place] or Home Care Consult Complete  ?Social Work Consult for [place] Planning/Counseling Complete  ?Palliative Care Screening Complete  ?Medication Review Press photographer) Complete  ?Some recent data might be hidden  ? ? ?

## 2021-08-28 NOTE — Evaluation (Signed)
Occupational Therapy Evaluation ?Patient Details ?Name: [name] ?MRN: [id number] ?DOB: Oct 16, 1950 ?Today's Date: 08/28/2021 ? ? ?History of Present Illness Patient is a 71 year old female admitted for acute exacerbation of COPD. PMH: hypertension, hyperlipidemia, PVD, noninsulin-dependent type 2 diabetes, CAD, COPD  ? ?Clinical Impression ?  ?Patient lives at home with children and is independent at baseline. Patient able to perform self care tasks and functional ambulation without any physical assistance. Patient saturations at rest on room air 100%. After ambulating to bathroom difficulty obtaining accurate wave form however finally reads 87% initially then increase to 93% on RA while seated in recliner.  Notified RN patient left on room air. No further acute OT needs at this time, will sign off. ?   ? ?Recommendations for follow up therapy are one component of a multi-disciplinary discharge planning process, led by the attending physician.  Recommendations may be updated based on patient status, additional functional criteria and insurance authorization.  ? ?Follow Up Recommendations ? No OT follow up  ?  ?Assistance Recommended at Discharge None  ?   ?Functional Status Assessment ? Patient has not had a recent decline in their functional status  ?Equipment Recommendations ? None recommended by OT  ?  ?   ?Precautions / Restrictions Precautions ?Precaution Comments: monitor sats ?Restrictions ?Weight Bearing Restrictions: No  ? ?  ? ?Mobility Bed Mobility ?  ?  ?  ?  ?  ?  ?  ?General bed mobility comments: in recliner ?  ? ?Transfers ?Overall transfer level: Independent ?Equipment used: None ?  ?  ?  ?  ?  ?  ?  ?  ?  ? ?  ?Balance Overall balance assessment: No apparent balance deficits (not formally assessed) ?  ?  ?  ?  ?  ?  ?  ?  ?  ?  ?  ?  ?  ?  ?  ?  ?  ?  ?   ? ?ADL either performed or assessed with clinical judgement  ? ?ADL Overall ADL's : Independent;At baseline ?  ?  ?  ?  ?  ?  ?  ?  ?  ?   ?  ?  ?  ?  ?  ?  ?  ?  ?  ?General ADL Comments: Patient able to perform self care tasks and functional ambulation without physical assistance.  ? ? ? ? ?Pertinent Vitals/Pain Pain Assessment ?Pain Assessment: No/denies pain  ? ? ? ?Hand Dominance Right ?  ?Extremity/Trunk Assessment Upper Extremity Assessment ?Upper Extremity Assessment: Overall WFL for tasks assessed ?  ?Lower Extremity Assessment ?Lower Extremity Assessment: Defer to PT evaluation ?  ?Cervical / Trunk Assessment ?Cervical / Trunk Assessment: Normal ?  ?Communication Communication ?Communication: No difficulties ?  ?Cognition Arousal/Alertness: Awake/alert ?Behavior During Therapy: [facility] for tasks assessed/performed ?Overall Cognitive Status: Within Functional Limits for tasks assessed ?  ?  ?  ?  ?  ?  ?  ?  ?  ?  ?  ?  ?  ?  ?  ?  ?  ?  ?  ?   ?   ?   ? ? ?Home Living Family/patient expects to be discharged to:: Private residence ?Living Arrangements: Children ?Available Help at Discharge: Family ?Type of Home: House ?Home Access: Stairs to enter ?Entrance Stairs-Number of Steps: 5 ?Entrance Stairs-Rails: Left ?Home Layout: One level ?  ?  ?Bathroom Shower/Tub: Walk-in shower ?  ?Bathroom Toilet: Handicapped height ?  ?  ?  Home Equipment: Rollator (4 wheels);Cane - single point ?  ?  ?  ? ?  ?Prior Functioning/Environment Prior Level of Function : Independent/Modified Independent ?  ?  ?  ?  ?  ?  ?  ?  ?  ? ?  ?  ?OT Problem List: Cardiopulmonary status limiting activity ?  ?   ?   ?OT Goals(Current goals can be found in the care plan section) Acute Rehab OT Goals ?Patient Stated Goal: Breathe better ?OT Goal Formulation: All assessment and education complete, DC therapy  ? ?AM-PAC OT "6 Clicks" Daily Activity     ?Outcome Measure Help from another person eating meals?: None ?Help from another person taking care of personal grooming?: None ?Help from another person toileting, which includes using toliet, bedpan, or urinal?: None ?Help from  another person bathing (including washing, rinsing, drying)?: None ?Help from another person to put on and taking off regular upper body clothing?: None ?Help from another person to put on and taking off regular lower body clothing?: None ?6 Click Score: 24 ?  ?End of Session Equipment Utilized During Treatment: Oxygen ?Nurse Communication: Mobility status;Other (comment) (O2 sats) ? ?Activity Tolerance: Patient tolerated treatment well ?Patient left: in chair;with call bell/phone within reach ? ?OT Visit Diagnosis: Other abnormalities of gait and mobility (R26.89)  ?              ?Time: 9758-8325 ?OT Time Calculation (min): 14 min ?Charges:  OT General Charges ?$OT Visit: 1 Visit ?OT Evaluation ?$OT Eval Low Complexity: 1 Low ? ?Delbert Phenix OT ?OT pager: [phone] ? ?Rosemary Holms ?08/28/2021, 9:58 AM ?

## 2021-08-28 NOTE — Progress Notes (Signed)
SATURATION QUALIFICATIONS: (This note is used to comply with regulatory documentation for home oxygen) ? ?Patient Saturations on Room Air at Rest = 100% ? ?Patient Saturations on Room Air while Ambulating = 87% ? ? ? ?Please briefly explain why patient needs home oxygen: Patient O2 saturations dropped to 87% while on room air with ADL activity.  ? ?Delbert Phenix OT ?OT pager: [phone] ? ?

## 2021-08-29 ENCOUNTER — Inpatient Hospital Stay (HOSPITAL_COMMUNITY): Payer: Medicare Other

## 2021-08-29 DIAGNOSIS — J9601 Acute respiratory failure with hypoxia: Secondary | ICD-10-CM

## 2021-08-29 DIAGNOSIS — R0609 Other forms of dyspnea: Secondary | ICD-10-CM

## 2021-08-29 LAB — ECHOCARDIOGRAM COMPLETE
AR max vel: 1.26 cm2
AV Area VTI: 1.3 cm2
AV Area mean vel: 1.25 cm2
AV Mean grad: 14.5 mmHg
AV Peak grad: 25.3 mmHg
Ao pk vel: 2.52 m/s
Area-P 1/2: 3.37 cm2
Height: 60 in
S' Lateral: 2.7 cm
Weight: 3376 oz

## 2021-08-29 LAB — GLUCOSE, CAPILLARY
Glucose-Capillary: 113 mg/dL — ABNORMAL HIGH (ref 70–99)
Glucose-Capillary: 95 mg/dL (ref 70–99)

## 2021-08-29 LAB — BASIC METABOLIC PANEL
Anion gap: 8 (ref 5–15)
BUN: 18 mg/dL (ref 8–23)
CO2: 27 mmol/L (ref 22–32)
Calcium: 8.6 mg/dL — ABNORMAL LOW (ref 8.9–10.3)
Chloride: 104 mmol/L (ref 98–111)
Creatinine, Ser: 0.75 mg/dL (ref 0.44–1.00)
GFR, Estimated: >60 mL/min (ref >60)
Glucose, Bld: 170 mg/dL — ABNORMAL HIGH (ref 70–99)
Potassium: 3.9 mmol/L (ref 3.5–5.1)
Sodium: 139 mmol/L (ref 135–145)

## 2021-08-29 MED ORDER — PREDNISONE 10 MG PO TABS: ORAL_TABLET | ORAL | 0 refills | Status: DC

## 2021-08-29 MED ORDER — AMLODIPINE BESYLATE 10 MG PO TABS: 10.0000 mg | ORAL_TABLET | Freq: Every day | ORAL | Status: DC

## 2021-08-29 MED ORDER — PREDNISONE 50 MG PO TABS
50.0000 mg | ORAL_TABLET | Freq: Every day | ORAL | Status: DC
  Filled 2021-08-29: qty 1

## 2021-08-29 NOTE — Consult Note (Signed)
[facility] CM Inpatient Consult ? ? ?08/29/2021 ? ?[name] ?July 17, 1950 ?[id number] ? ?[place] [facility] CM) ?  ?Patient chart reviewed with noted high risk score for unplanned readmission. Assessed for post hospital [facility] CM chronic disease management and care coordination services.  ? ?Spoke with patient and explained [place] outpatient services. HIPAA verified. Explained that case manager provides post hospital follow up and provides education and assistance for chronic disease management. Patient verbalizes consent for post hospital follow-up with [facility] CM RN care coordinator. ? ?Of note, [facility] Care Management services does not replace or interfere with any services that are arranged by inpatient case management or social work.  ? ?[name], MSN, RN ?[facility] Liaison ?Mobile Phone [phone]  ?Toll free office [phone] ?

## 2021-08-29 NOTE — Patient Outreach (Signed)
[facility] [facility]) Care Management ? ?08/29/2021 ? ?Annelies Conyers Winograd ?02/13/1951 ?[id number] ? ? ?Received hospital referral from [name], RN for RN case manager for post hospital chronic disease management services. Assigned to [name], RN Care Coordinator for follow up. ? ?Philmore Pali ?[place] Management Assistant ?[phone] ? ?

## 2021-08-29 NOTE — TOC Transition Note (Signed)
Transition of Care (TOC) - CM/SW Discharge Note ? ? ?Patient Details  ?Name: [name] ?MRN: [id number] ?Date of Birth: 1951/02/10 ? ?Transition of Care [facility]) CM/SW Contact:  ?Dessa Phi, RN ?Phone Number: ?08/29/2021, 10:22 AM ? ? ?Clinical Narrative: Has travel 02 tank in rm=[employer]. No furthe rCM needs.   ? ? ? ?Final next level of care: Home/Self Care ?Barriers to Discharge: No Barriers Identified ? ? ?Patient Goals and CMS Choice ?Patient states their goals for this hospitalization and ongoing recovery are:: home ?CMS Medicare.gov Compare Post Acute Care list provided to:: Patient ?Choice offered to / list presented to : Patient ? ?Discharge Placement ?  ?           ?  ?  ?  ?  ? ?Discharge Plan and Services ?  ?Discharge Planning Services: CM Consult ?Post Acute Care Choice:  (home 02)          ?  ?  ?  ?  ?  ?  ?  ?  ?  ?  ? ?Social Determinants of Health (SDOH) Interventions ?  ? ? ?Readmission Risk Interventions ?Readmission Risk Prevention Plan 08/28/2021  ?Transportation Screening Complete  ?PCP or Specialist Appt within 3-5 Days Complete  ?[place] or Home Care Consult Complete  ?Social Work Consult for [place] Planning/Counseling Complete  ?Palliative Care Screening Complete  ?Medication Review [employer] photographer) Complete  ?Some recent data might be hidden  ? ? ? ? ? ?

## 2021-08-29 NOTE — Plan of Care (Signed)

## 2021-08-29 NOTE — Discharge Summary (Signed)
Physician Discharge Summary   Patient: [name] MRN: [id number] DOB: [birth year]  Admit date:     08/23/2021  Discharge date: 08/29/21  Discharge Physician: Marinda Elk   PCP: Jackie Plum, MD   Recommendations at discharge:   Follow-up with PCP 2 to 4 weeks titrate antihypertensive medications as tolerated.  Discharge Diagnoses: Principal Problem:   Acute exacerbation of chronic obstructive pulmonary disease (COPD) (HCC) Active Problems:   Hypokalemia   HTN (hypertension)   PVD (peripheral vascular disease) (HCC)   Chronic anemia   Type 2 diabetes mellitus (HCC)   HLD (hyperlipidemia)   Class 3 obesity (HCC)  Resolved Problems:   * No resolved hospital problems. *  Hospital Course: 71 yo female with the past medical history of hypertension, dyslipidemia, T2DM, chronic anemia, obesity and sleep apnea who presented with dyspnea.  On her initial physical examination her oxygen saturation was 85% on room air on ambulation, blood pressure 154/70, HR 67, RR24 and oxygen saturation 98% on supplemental 02 per [place]. Lungs with bilateral wheezing, and increase respiratory effort,, being treated for an acute COPD exacerbation with acute respiratory failure with hypoxia  Assessment and Plan: * Acute exacerbation of chronic obstructive pulmonary disease (COPD) (HCC) Acute hypoxemic respiratory failure Restarted on oxygen supplementation to keep saturations greater 90%. She was started on steroids and antibiotics completed course of antibiotics in house. She was transitioned to oral steroids she will continue steroid taper as an outpatient. She will go home on oxygen, be evaluated by her PCP in 2 weeks.   Hypokalemia Repleted orally now resolved.  HTN (hypertension) Blood pressure seems to be relatively stable, currently on hydralazine, bisoprolol, losartan and Aldactone. Blood pressure ranging 108/50 to 145/66. No changes made to her medication.   PVD  (peripheral vascular disease) (HCC) Patient on aspirin and statin therapy.   Chronic anemia Hemoglobin relatively stable.   Type 2 diabetes mellitus (HCC) Currently on long-acting insulin plus sliding scale, blood glucose has remained relatively stable while on steroids.  HLD (hyperlipidemia) On ezetimibe and statin therapy   Class 3 obesity (HCC) Calculated BMI is 41.2          Consultants: none Procedures performed: none  Disposition: Home Diet recommendation:  Discharge Diet Orders (From admission, onward)     Start     Ordered   08/29/21 0000  Diet - low sodium heart healthy        08/29/21 0847           Carb modified diet DISCHARGE MEDICATION: Allergies as of 08/29/2021   No Known Allergies      Medication List     TAKE these medications    acetaminophen 500 MG tablet Commonly known as: TYLENOL Take 1,000 mg by mouth daily as needed for moderate pain.   albuterol 108 (90 Base) MCG/ACT inhaler Commonly known as: VENTOLIN HFA Inhale 1-2 puffs into the lungs every 6 (six) hours as needed for wheezing or shortness of breath.   amLODipine 10 MG tablet Commonly known as: NORVASC Take 1 tablet (10 mg total) by mouth daily. Start taking on: September 19, 2021 What changed: These instructions start on September 19, 2021. If you are unsure what to do until then, ask your doctor or other care provider.   Aspirin Low Dose 81 MG EC tablet Generic drug: aspirin TAKE 1 TABLET(81 MG) BY MOUTH DAILY What changed: See the new instructions.   atorvastatin 80 MG tablet Commonly known as: LIPITOR Take 80 mg by mouth  daily.   cilostazol 50 MG tablet Commonly known as: PLETAL Take 1 tablet (50 mg total) by mouth 2 (two) times daily.   ezetimibe 10 MG tablet Commonly known as: Zetia Take 1 tablet (10 mg total) by mouth daily.   ferrous sulfate 325 (65 FE) MG tablet Take 325 mg by mouth daily.   fluticasone 50 MCG/ACT nasal spray Commonly known as: FLONASE Place  1 spray into both nostrils daily as needed.   gabapentin 600 MG tablet Commonly known as: NEURONTIN Take 1 tablet (600 mg total) by mouth at bedtime. What changed: when to take this   hydrALAZINE 100 MG tablet Commonly known as: APRESOLINE TAKE 1 TABLET BY MOUTH THREE TIMES DAILY   ipratropium-albuterol 0.5-2.5 (3) MG/3ML Soln Commonly known as: DUONEB Take 3 mLs by nebulization 3 (three) times daily.   losartan 100 MG tablet Commonly known as: COZAAR Take 100 mg by mouth daily.   metFORMIN 500 MG tablet Commonly known as: GLUCOPHAGE Take 0.5 tablets (250 mg total) by mouth 2 (two) times daily with a meal.   Myrbetriq 25 MG Tb24 tablet Generic drug: mirabegron ER Take 25 mg by mouth daily.   nitroGLYCERIN 0.4 MG SL tablet Commonly known as: NITROSTAT Place 1 tablet (0.4 mg total) under the tongue every 5 (five) minutes as needed for chest pain.   pantoprazole 40 MG tablet Commonly known as: PROTONIX Take 40 mg by mouth daily.   predniSONE 10 MG tablet Commonly known as: DELTASONE Takes 6 tablets for 1 days, then 5 tablets for 1 days, then 4 tablets for 1 days, then 3 tablets for 1 days, then 2 tabs for 1 days, then 1 tab for 1 days, and then stop.   spironolactone 50 MG tablet Commonly known as: ALDACTONE TAKE 1 TABLET(50 MG) BY MOUTH DAILY What changed: See the new instructions.               Durable Medical Equipment  (From admission, onward)           Start     Ordered   08/28/21 1432  For home use only DME oxygen  Once       Question Answer Comment  Length of Need 6 Months   Mode or (Route) Nasal cannula   Liters per Minute 2   Frequency Continuous (stationary and portable oxygen unit needed)   Oxygen conserving device Yes   Oxygen delivery system Gas      08/28/21 1432            Follow-up Information     [facility] Follow up.   Why: home oxygen Contact information: [address] [place]  [address] [phone]                Discharge Exam: Ceasar Mons Weights   08/23/21 1935  Weight: 95.7 kg   General exam: In no acute distress. Respiratory system: Good air movement and clear to auscultation. Cardiovascular system: S1 & S2 heard, RRR. No JVD. Gastrointestinal system: Abdomen is nondistended, soft and nontender.  Extremities: No pedal edema. Skin: No rashes, lesions or ulcers Psychiatry: Judgement and insight appear normal. Mood & affect appropriate.  Condition at discharge: good  The results of significant diagnostics from this hospitalization (including imaging, microbiology, ancillary and laboratory) are listed below for reference.   Imaging Studies: DG Chest Port 1 View  Result Date: 08/23/2021 CLINICAL DATA:  Two days of shortness of breath and dry cough. EXAM: PORTABLE CHEST 1 VIEW COMPARISON:  May 09, 2021 FINDINGS: Similar cardiomegaly. Aortic atherosclerosis. Hazy opacities in lung bases are favored to reflect positioning. No visible pleural effusion or pneumothorax. No acute osseous abnormality. IMPRESSION: 1. Similar cardiomegaly. 2. Hazy opacities in lung bases are favored to reflect artifact from positioning. If continued clinical concern consider repeat examination with upright frontal and lateral chest radiograph. Electronically Signed   By: Maudry Mayhew M.D.   On: 08/23/2021 17:40   ABI WITH/WO TBI  Result Date: 08/17/2021  LOWER EXTREMITY DOPPLER STUDY Patient Name:  Alvesta Odell  Date of Exam:   08/17/2021 Medical Rec #: [id number]              Accession #:    [id number] Date of Birth: [birth year]              Patient Gender: F Patient Age:   52 years Exam Location:  [facility] Procedure:      VAS [place] ABI WITH/WO TBI Referring Phys: Tessa Lerner --------------------------------------------------------------------------------  Indications: Peripheral artery disease, and Patient complains of a pain in her              right thigh  that feels like a needle is poking her. High Risk Factors: Hypertension, hyperlipidemia, Diabetes, current smoker,                    coronary artery disease.  Vascular Interventions: 02/11/2017: (Abdominal arteriogram) Severe                         atherosclerotic changes of the abdominal aorta and                         ectasia. Calcification also evident. Aortoiliac                         bifurcation is widely patent.                          (Right femoral angiogram w/ distal runoff) The right SFA                         is occluded in its origin with reconstitution above the                         knee. 80-90% stenosis of profunda.                          (Intervention) Successful PTA and drug-coated balloon                         angioplasty of the mid and distal SFA. Stenting of the                         proximal SFA. Comparison Study: 04/15/2018: Rt ABI 0.86; Lt ABI 0.97. Performing Technologist: Ethelle Lyon  Examination Guidelines: A complete evaluation includes at minimum, Doppler waveform signals and systolic blood pressure reading at the level of bilateral brachial, anterior tibial, and posterior tibial arteries, when vessel segments are accessible. Bilateral testing is considered an integral part of a complete examination. Photoelectric Plethysmograph (PPG) waveforms and toe systolic pressure readings are included as required and additional duplex testing as needed. Limited examinations  for reoccurring indications may be performed as noted.  ABI Findings: +---------+------------------+-----+---------+--------+ Right    Rt Pressure (mmHg)IndexWaveform Comment  +---------+------------------+-----+---------+--------+ Brachial 152                                      +---------+------------------+-----+---------+--------+ PTA      157               1.03 triphasic         +---------+------------------+-----+---------+--------+ DP       144               0.95 biphasic           +---------+------------------+-----+---------+--------+ Great Toe145               0.95                   +---------+------------------+-----+---------+--------+ +---------+------------------+-----+---------+-------+ Left     Lt Pressure (mmHg)IndexWaveform Comment +---------+------------------+-----+---------+-------+ Brachial 148                                     +---------+------------------+-----+---------+-------+ PTA      164               1.08 triphasic        +---------+------------------+-----+---------+-------+ DP       165               1.09 triphasic        +---------+------------------+-----+---------+-------+ Great Toe98                0.64                  +---------+------------------+-----+---------+-------+ +-------+-----------+-----------+------------+------------+ ABI/TBIToday's ABIToday's TBIPrevious ABIPrevious TBI +-------+-----------+-----------+------------+------------+ Right  1.03       0.95       0.86                     +-------+-----------+-----------+------------+------------+ Left   1.09       0.64       0.97                     +-------+-----------+-----------+------------+------------+   Summary: Right: Resting right ankle-brachial index is within normal range. No evidence of significant right lower extremity arterial disease. The right toe-brachial index is normal. Left: Resting left ankle-brachial index is within normal range. No evidence of significant left lower extremity arterial disease. The left toe-brachial index is abnormal.  *See table(s) above for measurements and observations.  Electronically signed by Gerarda Fraction on 08/17/2021 at 5:48:24 PM.    Final    LE ARTERIAL  Result Date: 08/17/2021 LOWER EXTREMITY ARTERIAL DUPLEX STUDY Patient Name:  MYRNA PEBLEY  Date of Exam:   08/17/2021 Medical Rec #: [id number]              Accession #:    [id number] Date of Birth: [birth year]              Patient Gender: F  Patient Age:   21 years Exam Location:  [facility] Procedure:      VAS [place] LOWER EXTREMITY ARTERIAL DUPLEX Referring Phys: Tessa Lerner --------------------------------------------------------------------------------  Indications: Peripheral artery disease, and Patient complains of a pain in her              right  thigh that feels like a needle is poking her. High Risk Factors: Hypertension, hyperlipidemia, Diabetes, current smoker,                    coronary artery disease.  Vascular Interventions: 02/11/2017: (Abdominal arteriogram) Severe                         atherosclerotic changes of the abdominal aorta and                         ectasia. Calcification also evident. Aortoiliac                         bifurcation is widely patent.                          (Right femoral angiogram w/ distal runoff) The right SFA                         is occluded in its origin with reconstitution above the                         knee. 80-90% stenosis of profunda.                          (Intervention) Successful PTA and drug-coated balloon                         angioplasty of the mid and distal SFA. Stenting of the                         proximal SFA. Current ABI:            Rt ABI 1.03, Lt ABI 1.09 Comparison Study: None Performing Technologist: Ethelle Lyon  Examination Guidelines: A complete evaluation includes B-mode imaging, spectral Doppler, color Doppler, and power Doppler as needed of all accessible portions of each vessel. Bilateral testing is considered an integral part of a complete examination. Limited examinations for reoccurring indications may be performed as noted.  +-----------+--------+-----+---------------+---------+---------+ RIGHT      PSV cm/sRatioStenosis       Waveform Comments  +-----------+--------+-----+---------------+---------+---------+ CFA Prox   129                         triphasic          +-----------+--------+-----+---------------+---------+---------+  CFA Distal 127                         triphasic          +-----------+--------+-----+---------------+---------+---------+ DFA        269          50-74% stenosis                   +-----------+--------+-----+---------------+---------+---------+ SFA Prox                                        see stent +-----------+--------+-----+---------------+---------+---------+ SFA Mid    75  biphasic           +-----------+--------+-----+---------------+---------+---------+ SFA Distal 99                          biphasic           +-----------+--------+-----+---------------+---------+---------+ POP Prox   104                         biphasic           +-----------+--------+-----+---------------+---------+---------+ POP Distal 97                          triphasic          +-----------+--------+-----+---------------+---------+---------+ ATA Distal 53                          biphasic           +-----------+--------+-----+---------------+---------+---------+ PTA Distal 71                          biphasic           +-----------+--------+-----+---------------+---------+---------+ PERO Distal51                          biphasic           +-----------+--------+-----+---------------+---------+---------+  Right Stent(s): +---------------+--------+--------+---------+--------+ SFA            PSV cm/sStenosisWaveform Comments +---------------+--------+--------+---------+--------+ Prox to Stent  153             biphasic          +---------------+--------+--------+---------+--------+ Proximal Stent 184             biphasic          +---------------+--------+--------+---------+--------+ Mid Stent      96              biphasic          +---------------+--------+--------+---------+--------+ Distal Stent   87              triphasic         +---------------+--------+--------+---------+--------+ Distal to Stent112              biphasic          +---------------+--------+--------+---------+--------+    Summary: Right: 50-74% stenosis noted in the deep femoral artery. Patent stent with no evidence of stenosis in the superficial femoral artery.  See table(s) above for measurements and observations. Electronically signed by Gerarda Fraction on 08/17/2021 at 5:48:52 PM.    Final    VAS [place] LOWER EXTREMITY VENOUS (DVT)  Result Date: 08/15/2021  Lower Venous DVT Study Patient Name:  ELESE WICKERT  Date of Exam:   08/15/2021 Medical Rec #: [id number]              Accession #:    [id number] Date of Birth: [birth year]              Patient Gender: F Patient Age:   27 years Exam Location:  [facility] Procedure:      VAS [place] LOWER EXTREMITY VENOUS (DVT) Referring Phys: Tessa Lerner --------------------------------------------------------------------------------  Indications: Pain, and Swelling. Other Indications: Patient states that it feels like a needle is poking her in  lateral thigh area. Limitations: Body habitus.  Comparison Study: None Performing              Student assisted with scan under observation of Megan Technologist:           Vilinda Boehringer  Examination Guidelines: A complete evaluation includes B-mode imaging, spectral Doppler, color Doppler, and power Doppler as needed of all accessible portions of each vessel. Bilateral testing is considered an integral part of a complete examination. Limited examinations for reoccurring indications may be performed as noted. The reflux portion of the exam is performed with the patient in reverse Trendelenburg.  +---------+---------------+---------+-----------+----------+--------------+ RIGHT    CompressibilityPhasicitySpontaneityPropertiesThrombus Aging +---------+---------------+---------+-----------+----------+--------------+ CFV      Full           Yes      Yes                                  +---------+---------------+---------+-----------+----------+--------------+ SFJ      Full                                                        +---------+---------------+---------+-----------+----------+--------------+ FV Prox  Full                                                        +---------+---------------+---------+-----------+----------+--------------+ FV Mid   Full           Yes      Yes                                 +---------+---------------+---------+-----------+----------+--------------+ FV DistalFull                                                        +---------+---------------+---------+-----------+----------+--------------+ PFV      Full                                                        +---------+---------------+---------+-----------+----------+--------------+ POP      Full           Yes      Yes                                 +---------+---------------+---------+-----------+----------+--------------+ PTV      Full                                                        +---------+---------------+---------+-----------+----------+--------------+ PERO     Full                                                        +---------+---------------+---------+-----------+----------+--------------+  Gastroc  Full                                                        +---------+---------------+---------+-----------+----------+--------------+ GSV      Full                                                        +---------+---------------+---------+-----------+----------+--------------+ SSV      Full                                                        +---------+---------------+---------+-----------+----------+--------------+   +----+---------------+---------+-----------+----------+--------------+ LEFTCompressibilityPhasicitySpontaneityPropertiesThrombus Aging  +----+---------------+---------+-----------+----------+--------------+ CFV Full           Yes      Yes                                 +----+---------------+---------+-----------+----------+--------------+     Summary: RIGHT: - There is no evidence of deep vein thrombosis in the lower extremity. - There is no evidence of superficial venous thrombosis.  - No cystic structure found in the popliteal fossa.  LEFT: - No evidence of common femoral vein obstruction. - Results were called to Dr. Beaulah Dinning office (08/15/2021).  *See table(s) above for measurements and observations. Electronically signed by Lemar Livings MD on 08/15/2021 at 5:08:28 PM.    Final    IVC/ILIACS DUPLEX  Result Date: 08/17/2021 ABDOMINAL AORTA STUDY Patient Name:  AIMIE MASIN  Date of Exam:   08/17/2021 Medical Rec #: [id number]              Accession #:    [id number] Date of Birth: [birth year]              Patient Gender: F Patient Age:   54 years Exam Location:  [facility] Procedure:      VAS US AORTA/IVC/ILIACS Referring Phys: Tessa Lerner --------------------------------------------------------------------------------  Risk Factors: Hypertension, hyperlipidemia, Diabetes, current smoker, coronary               artery disease. Limitations: Air/bowel gas and obesity.  Comparison Study: 02/11/2017: (Abdominal arteriogram) Severe atherosclerotic                   changes of the abdominal aorta and ectasia. Calcification also                   evident. Aortoiliac bifurcation is widely patent. Performing Technologist: Ethelle Lyon  Examination Guidelines: A complete evaluation includes B-mode imaging, spectral Doppler, color Doppler, and power Doppler as needed of all accessible portions of each vessel. Bilateral testing is considered an integral part of a complete examination. Limited examinations for reoccurring indications may be performed as noted.  Abdominal Aorta Findings:  +-------------+-------+----------+----------+--------+--------+--------+ Location     AP (cm)Trans (cm)PSV (cm/s)WaveformThrombusComments +-------------+-------+----------+----------+--------+--------+--------+ Proximal                      83                                 +-------------+-------+----------+----------+--------+--------+--------+  Mid                           81                                 +-------------+-------+----------+----------+--------+--------+--------+ Distal                        77                                 +-------------+-------+----------+----------+--------+--------+--------+ RT CIA Prox                   190                                +-------------+-------+----------+----------+--------+--------+--------+ RT EIA Prox                   158                                +-------------+-------+----------+----------+--------+--------+--------+ RT EIA Mid                    115                                +-------------+-------+----------+----------+--------+--------+--------+ RT EIA Distal                 118                                +-------------+-------+----------+----------+--------+--------+--------+ LT CIA Prox                   116                                +-------------+-------+----------+----------+--------+--------+--------+ LT EIA Prox                   170                                +-------------+-------+----------+----------+--------+--------+--------+ LT EIA Mid                    173                                +-------------+-------+----------+----------+--------+--------+--------+ LT EIA Distal                 165                                +-------------+-------+----------+----------+--------+--------+--------+ Visualization of the Distal Abdominal Aorta, Right CIA and Left CIA was limited. Unable to visualize mid-distal CIA  bilaterally due to bowel gas.  Summary: Stenosis: Calcifications noted throughout the abdominal aorta. There is no evidence of significant stenosis in the aortoiliac arteries, however, this is based on limited visualization.  *  See table(s) above for measurements and observations.  Electronically signed by Gerarda Fraction on 08/17/2021 at 5:48:36 PM.    Final     Microbiology: Results for orders placed or performed during the hospital encounter of 08/23/21  Resp Panel by RT-PCR (Flu A&B, Covid) Nasopharyngeal Swab     Status: None   Collection Time: 08/23/21  5:17 PM   Specimen: Nasopharyngeal Swab; Nasopharyngeal(NP) swabs in vial transport medium  Result Value Ref Range Status   SARS Coronavirus 2 by RT PCR NEGATIVE NEGATIVE Final    Comment: (NOTE) SARS-CoV-2 target nucleic acids are NOT DETECTED.  The SARS-CoV-2 RNA is generally detectable in upper respiratory specimens during the acute phase of infection. The lowest concentration of SARS-CoV-2 viral copies this assay can detect is 138 copies/mL. A negative result does not preclude SARS-Cov-2 infection and should not be used as the sole basis for treatment or other patient management decisions. A negative result may occur with  improper specimen collection/handling, submission of specimen other than nasopharyngeal swab, presence of viral mutation(s) within the areas targeted by this assay, and inadequate number of viral copies(<138 copies/mL). A negative result must be combined with clinical observations, patient history, and epidemiological information. The expected result is Negative.  Fact Sheet for Patients:  BloggerCourse.com  Fact Sheet for Healthcare Providers:  SeriousBroker.it  This test is no t yet approved or cleared by the [place] FDA and  has been authorized for detection and/or diagnosis of SARS-CoV-2 by FDA under an Emergency Use Authorization (EUA). This EUA will  remain  in effect (meaning this test can be used) for the duration of the COVID-19 declaration under Section 564(b)(1) of the Act, 21 U.S.C.section 360bbb-3(b)(1), unless the authorization is terminated  or revoked sooner.       Influenza A by PCR NEGATIVE NEGATIVE Final   Influenza B by PCR NEGATIVE NEGATIVE Final    Comment: (NOTE) The Xpert Xpress SARS-CoV-2/FLU/RSV plus assay is intended as an aid in the diagnosis of influenza from Nasopharyngeal swab specimens and should not be used as a sole basis for treatment. Nasal washings and aspirates are unacceptable for Xpert Xpress SARS-CoV-2/FLU/RSV testing.  Fact Sheet for Patients: BloggerCourse.com  Fact Sheet for Healthcare Providers: SeriousBroker.it  This test is not yet approved or cleared by the [place] FDA and has been authorized for detection and/or diagnosis of SARS-CoV-2 by FDA under an Emergency Use Authorization (EUA). This EUA will remain in effect (meaning this test can be used) for the duration of the COVID-19 declaration under Section 564(b)(1) of the Act, 21 U.S.C. section 360bbb-3(b)(1), unless the authorization is terminated or revoked.  Performed at [facility], [address] [place], [place] [address]   Respiratory (~20 pathogens) panel by PCR     Status: None   Collection Time: 08/24/21 12:26 PM   Specimen: Nasopharyngeal Swab; Respiratory  Result Value Ref Range Status   Adenovirus NOT DETECTED NOT DETECTED Final   Coronavirus 229E NOT DETECTED NOT DETECTED Final    Comment: (NOTE) The Coronavirus on the Respiratory Panel, DOES NOT test for the novel  Coronavirus (2019 nCoV)    Coronavirus HKU1 NOT DETECTED NOT DETECTED Final   Coronavirus NL63 NOT DETECTED NOT DETECTED Final   Coronavirus OC43 NOT DETECTED NOT DETECTED Final   Metapneumovirus NOT DETECTED NOT DETECTED Final   Rhinovirus / Enterovirus NOT DETECTED NOT  DETECTED Final   Influenza A NOT DETECTED NOT DETECTED Final   Influenza B NOT DETECTED NOT DETECTED Final  Parainfluenza Virus 1 NOT DETECTED NOT DETECTED Final   Parainfluenza Virus 2 NOT DETECTED NOT DETECTED Final   Parainfluenza Virus 3 NOT DETECTED NOT DETECTED Final   Parainfluenza Virus 4 NOT DETECTED NOT DETECTED Final   Respiratory Syncytial Virus NOT DETECTED NOT DETECTED Final   Bordetella pertussis NOT DETECTED NOT DETECTED Final   Bordetella Parapertussis NOT DETECTED NOT DETECTED Final   Chlamydophila pneumoniae NOT DETECTED NOT DETECTED Final   Mycoplasma pneumoniae NOT DETECTED NOT DETECTED Final    Comment: Performed at [facility], [address] [place], [place] [address]    Labs: CBC: Recent Labs  Lab 08/23/21 1716 08/25/21 0439 08/26/21 0443 08/27/21 0416 08/28/21 0508  WBC 7.9 9.7 10.3 10.8* 12.8*  NEUTROABS 5.6 8.8* 9.3*  --   --   HGB 8.0* 7.9* 7.9* 7.7* 8.1*  HCT 29.7* 30.9* 30.6* 30.4* 30.9*  MCV 72.1* 74.6* 74.8* 75.2* 74.6*  PLT 414* 444* 494* 493* 522*   Basic Metabolic Panel: Recent Labs  Lab 08/23/21 2339 08/25/21 0439 08/26/21 0443 08/27/21 0416 08/28/21 0508 08/29/21 0453  NA  --  137 138 141 138 139  K 3.6 4.1 4.1 3.9 3.6 3.9  CL  --  105 105 107 104 104  CO2  --  25 27 28 29 27   GLUCOSE  --  141* 153* 101* 131* 170*  BUN  --  20 23 26* 18 18  CREATININE  --  0.66 0.76 0.79 0.67 0.75  CALCIUM  --  8.6* 8.4* 8.7* 8.3* 8.6*  MG 1.5* 2.0 1.7 2.0  --   --    Liver Function Tests: Recent Labs  Lab 08/23/21 1716  AST 16  ALT 10  ALKPHOS 70  BILITOT 0.2*  PROT 6.8  ALBUMIN 3.4*   CBG: Recent Labs  Lab 08/28/21 0746 08/28/21 1153 08/28/21 1624 08/28/21 2126 08/29/21 0720  GLUCAP 114* 100* 163* 174* 113*    Discharge time spent: greater than 30 minutes.  Signed: Marinda Elk, MD [facility] 08/29/2021

## 2021-08-29 NOTE — Progress Notes (Signed)
Echocardiogram ?2D Echocardiogram has been performed. ? ?[name] ?08/29/2021, 11:26 AM ?

## 2021-08-30 ENCOUNTER — Telehealth: Payer: Self-pay | Admitting: *Deleted

## 2021-08-30 NOTE — Telephone Encounter (Signed)
Ortho bundle 1 year post op call completed. ?

## 2021-08-31 ENCOUNTER — Other Ambulatory Visit: Payer: Self-pay | Admitting: *Deleted

## 2021-08-31 ENCOUNTER — Encounter: Payer: Self-pay | Admitting: *Deleted

## 2021-08-31 NOTE — Patient Instructions (Signed)
Visit Information ? ?Thank you for taking time to visit with me today. Please don't hesitate to contact me if I can be of assistance to you before our next scheduled telephone appointment. ? ?Following are the goals we discussed today:  ?Take all medications as prescribed ?Attend all scheduled provider appointments ?Call pharmacy for medication refills 3-7 days in advance of running out of medications ?Attend church or other social activities ?Perform all self care activities independently  ?Perform IADL's (shopping, preparing meals, housekeeping, managing finances) independently ?Call provider office for new concerns or questions  ?eliminate smoking in my home ?identify and avoid work-related triggers ?identify and remove indoor air pollutants ?limit outdoor activity during cold weather ?develop a rescue plan ?eliminate symptom triggers at home ?follow rescue plan if symptoms flare-up ?keep follow-up appointments: Reminder to contact provider on Monday to schedule an appointment for post-op hospitalization. ?don't eat or exercise right before bedtime ?use devices that will help like a cane, sock-puller or reacher ?do breathing exercises every day ?  ?

## 2021-08-31 NOTE — Patient Outreach (Signed)
[facility]) Care Management Telephonic RN Care Manager Note   08/31/2021 Name:  [name] MRN:  [id number] DOB:  1951/01/23  Summary: Recent hospital discharged for pneumonia/COPD. Enrolled into the COPD program and care management services for improving her ongoing management of care. Plan of care discussed as pt agreed to comply. Will notify provider of pt's disposition with [facility] services.  Recommendations/Changes made from today's visit: Discussed the plan of care and adhering to all discussed for managing her COPD. Will follow up with ongoing transition of care calls over the next month for pt's adherence.  Subjective: [name] is an 71 y.o. year old female who is a primary patient of Osei-Bonsu, Iona Beard, MD. The care management team was consulted for assistance with care management and/or care coordination needs.    Telephonic RN Care Manager completed Telephone Visit today.  Objective:   Medications Reviewed Today     Reviewed by Tobi Bastos, RN (Registered Nurse) on 08/31/21 at 1525  Med List Status: <None>   Medication Order Taking? Sig Documenting Provider Last Dose Status Informant  acetaminophen (TYLENOL) 500 MG tablet [id number] Yes Take 1,000 mg by mouth daily as needed for moderate pain. [provider] Taking Active Self  albuterol (PROVENTIL HFA;VENTOLIN HFA) 108 (90 Base) MCG/ACT inhaler [id number] Yes Inhale 1-2 puffs into the lungs every 6 (six) hours as needed for wheezing or shortness of breath. [provider] Taking Active Self  amLODipine (NORVASC) 10 MG tablet [id number] Yes Take 1 tablet (10 mg total) by mouth daily. Charlynne Cousins, MD Taking Active   ASPIRIN LOW DOSE 81 MG EC tablet [id number] Yes TAKE 1 TABLET(81 MG) BY MOUTH DAILY  Patient taking differently: Take 81 mg by mouth daily.   Magnant, Gerrianne Scale, PA-C Taking Active Self  atorvastatin (LIPITOR) 80 MG tablet [id number] Yes Take 80 mg  by mouth daily. [provider] Taking Active Self  cilostazol (PLETAL) 50 MG tablet [id number] Yes Take 1 tablet (50 mg total) by mouth 2 (two) times daily. Rex Kras, DO Taking Active Self  ezetimibe (ZETIA) 10 MG tablet [id number] Yes Take 1 tablet (10 mg total) by mouth daily. Rex Kras, DO Taking Active Self  ferrous sulfate 325 (65 FE) MG tablet [id number] Yes Take 325 mg by mouth daily. [provider] Taking Active Self  fluticasone (FLONASE) 50 MCG/ACT nasal spray [id number] Yes Place 1 spray into both nostrils daily as needed. [provider] Taking Active Self  gabapentin (NEURONTIN) 600 MG tablet [id number] Yes Take 1 tablet (600 mg total) by mouth at bedtime.  Patient taking differently: Take 600 mg by mouth 2 (two) times daily.   [place], Max T, [place] Taking Active Self  hydrALAZINE (APRESOLINE) 100 MG tablet [id number] Yes TAKE 1 TABLET BY MOUTH THREE TIMES DAILY  Patient taking differently: Take 100 mg by mouth 3 (three) times daily.   Adrian Prows, MD Taking Active Self  ipratropium-albuterol (DUONEB) 0.5-2.5 (3) MG/3ML SOLN [id number] Yes Take 3 mLs by nebulization 3 (three) times daily. Donne Hazel, MD Taking Active Self  losartan (COZAAR) 100 MG tablet [id number] Yes Take 100 mg by mouth daily. [provider] Taking Active Self  metFORMIN (GLUCOPHAGE) 500 MG tablet [id number] Yes Take 0.5 tablets (250 mg total) by mouth 2 (two) times daily with a meal. Adrian Prows, MD Taking Active Self  MYRBETRIQ 25 MG TB24 tablet [id number] Yes Take 25 mg by mouth daily. [provider] Taking Active Self  nitroGLYCERIN (NITROSTAT)  0.4 MG SL tablet [id number]  Place 1 tablet (0.4 mg total) under the tongue every 5 (five) minutes as needed for chest pain.  Patient not taking: Reported on 08/23/2021   Miquel Dunn, NP  Expired 08/14/21 2359 Self           Med Note (WHITE, Wynn Maudlin Aug 23, 2021 10:33 PM)    pantoprazole (PROTONIX) 40 MG tablet  [id number] Yes Take 40 mg by mouth daily. [provider] Taking Active Self  predniSONE (DELTASONE) 10 MG tablet [id number] Yes Takes 6 tablets for 1 days, then 5 tablets for 1 days, then 4 tablets for 1 days, then 3 tablets for 1 days, then 2 tabs for 1 days, then 1 tab for 1 days, and then stop. Charlynne Cousins, MD Taking Active   spironolactone (ALDACTONE) 50 MG tablet [id number] Yes TAKE 1 TABLET(50 MG) BY MOUTH DAILY  Patient taking differently: Take 50 mg by mouth daily.   Rex Kras, DO Taking Active Self             SDOH:  (Social Determinants of Health) assessments and interventions performed:  SDOH Interventions    Flowsheet Row Most Recent Value  SDOH Interventions   Food Insecurity Interventions Intervention Not Indicated  Transportation Interventions Intervention Not Indicated        Care Plan  Review of patient past medical history, allergies, medications, health status, including review of consultants reports, laboratory and other test data, was performed as part of comprehensive evaluation for care management services.   Care Plan : RN Care Manager Plan of Care  Updates made by Tobi Bastos, RN since 08/31/2021 12:00 AM     Problem: Knowledg Deficit related to COPD management and care coordination needs.   Priority: High     Long-Range Goal: Development of plan of care for manament of COPD   Start Date: 08/31/2021  Expected End Date: 02/21/2022  Priority: High  Note:   Current Barriers:  Knowledge Deficits related to plan of care for management of COPD   RNCM Clinical Goal(s):  Patient will verbalize understanding of plan for management of COPD as evidenced by self report and chart review  through collaboration with RN Care manager, provider, and care team.   Interventions: Inter-disciplinary care team collaboration (see longitudinal plan of care) Evaluation of current treatment plan related to  self management and patient's adherence  to plan as established by provider   COPD Interventions:  (Status:  New goal.) Long Term Goal Provided patient with basic written and verbal COPD education on self care/management/and exacerbation prevention Advised patient to track and manage COPD triggers Provided written and verbal instructions on pursed lip breathing and utilized returned demonstration as teach back Provided instruction about proper use of medications used for management of COPD including inhalers Advised patient to self assesses COPD action plan zone and make appointment with provider if in the yellow zone for 48 hours without improvement Provided education about and advised patient to utilize infection prevention strategies to reduce risk of respiratory infection Discussed the importance of adequate rest and management of fatigue with COPD Screening for signs and symptoms of depression related to chronic disease state  Assessed social determinant of health barriers  Patient Goals/Self-Care Activities: Take all medications as prescribed Attend all scheduled provider appointments Call pharmacy for medication refills 3-7 days in advance of running out of medications Attend church or other social activities Perform all self care activities independently  Perform IADL's (  shopping, preparing meals, housekeeping, managing finances) independently Call provider office for new concerns or questions  eliminate smoking in my home identify and avoid work-related triggers identify and remove indoor air pollutants limit outdoor activity during cold weather develop a rescue plan eliminate symptom triggers at home follow rescue plan if symptoms flare-up keep follow-up appointments: Reminder to contact provider on Monday to schedule an appointment for post-op hospitalization. don't eat or exercise right before bedtime use devices that will help like a cane, sock-puller or reacher do breathing exercises every day  Follow Up  Plan:  Telephone follow up appointment with care management team member scheduled for:  09/06/2021 Ongoing Transition of care The patient has been provided with contact information for the care management team and has been advised to call with any health related questions or concerns.       [name], RN Care Management Coordinator [place] Office [phone]

## 2021-09-03 ENCOUNTER — Other Ambulatory Visit: Payer: Self-pay | Admitting: Cardiology

## 2021-09-03 DIAGNOSIS — I739 Peripheral vascular disease, unspecified: Secondary | ICD-10-CM

## 2021-09-04 DIAGNOSIS — E559 Vitamin D deficiency, unspecified: Secondary | ICD-10-CM | POA: Diagnosis not present

## 2021-09-04 DIAGNOSIS — I10 Essential (primary) hypertension: Secondary | ICD-10-CM | POA: Diagnosis not present

## 2021-09-04 DIAGNOSIS — E7800 Pure hypercholesterolemia, unspecified: Secondary | ICD-10-CM | POA: Diagnosis not present

## 2021-09-04 DIAGNOSIS — J449 Chronic obstructive pulmonary disease, unspecified: Secondary | ICD-10-CM | POA: Diagnosis not present

## 2021-09-04 DIAGNOSIS — J9611 Chronic respiratory failure with hypoxia: Secondary | ICD-10-CM | POA: Diagnosis not present

## 2021-09-04 DIAGNOSIS — Z1329 Encounter for screening for other suspected endocrine disorder: Secondary | ICD-10-CM | POA: Diagnosis not present

## 2021-09-04 DIAGNOSIS — J9612 Chronic respiratory failure with hypercapnia: Secondary | ICD-10-CM | POA: Diagnosis not present

## 2021-09-04 DIAGNOSIS — Z0001 Encounter for general adult medical examination with abnormal findings: Secondary | ICD-10-CM | POA: Diagnosis not present

## 2021-09-04 DIAGNOSIS — E1140 Type 2 diabetes mellitus with diabetic neuropathy, unspecified: Secondary | ICD-10-CM | POA: Diagnosis not present

## 2021-09-04 DIAGNOSIS — Z136 Encounter for screening for cardiovascular disorders: Secondary | ICD-10-CM | POA: Diagnosis not present

## 2021-09-05 ENCOUNTER — Other Ambulatory Visit: Payer: Self-pay | Admitting: *Deleted

## 2021-09-05 DIAGNOSIS — J441 Chronic obstructive pulmonary disease with (acute) exacerbation: Secondary | ICD-10-CM | POA: Diagnosis not present

## 2021-09-05 NOTE — Patient Outreach (Signed)
?[facility]) Care Management ?Telephonic RN Care Manager Note ? ? ?09/05/2021 ?Name:  [name] MRN:  [id number] DOB:  11-25-1950 ? ?Summary: ?Update on week 3 for ongoing transition of care call. ? ?Recommendations/Changes made from today's visit: ?To continue COPD management of care related to exacerbation symptoms response and monitor COPD zones. ? ?Subjective: ?[name] is an 71 y.o. year old female who is a primary patient of Osei-Bonsu, Iona Beard, MD. The care management team was consulted for assistance with care management and/or care coordination needs.   ? ?Telephonic RN Care Manager completed Telephone Visit today. ? ?Objective:  ? ?Medications Reviewed Today   ? ? Reviewed by Tobi Bastos, RN (Registered Nurse) on 08/31/21 at 1525  Med List Status: <None>  ? ?Medication Order Taking? Sig Documenting Provider Last Dose Status Informant  ?acetaminophen (TYLENOL) 500 MG tablet [id number] Yes Take 1,000 mg by mouth daily as needed for moderate pain. [provider] Taking Active Self  ?albuterol (PROVENTIL HFA;VENTOLIN HFA) 108 (90 Base) MCG/ACT inhaler [id number] Yes Inhale 1-2 puffs into the lungs every 6 (six) hours as needed for wheezing or shortness of breath. [provider] Taking Active Self  ?amLODipine (NORVASC) 10 MG tablet [id number] Yes Take 1 tablet (10 mg total) by mouth daily. Charlynne Cousins, MD Taking Active   ?ASPIRIN LOW DOSE 81 MG EC tablet [id number] Yes TAKE 1 TABLET(81 MG) BY MOUTH DAILY  ?Patient taking differently: Take 81 mg by mouth daily.  ? Magnant, Gerrianne Scale, PA-C Taking Active Self  ?atorvastatin (LIPITOR) 80 MG tablet [id number] Yes Take 80 mg by mouth daily. [provider] Taking Active Self  ?cilostazol (PLETAL) 50 MG tablet [id number] Yes Take 1 tablet (50 mg total) by mouth 2 (two) times daily. Rex Kras, DO Taking Active Self  ?ezetimibe (ZETIA) 10 MG tablet [id number] Yes Take 1 tablet (10 mg total)  by mouth daily. Rex Kras, DO Taking Active Self  ?ferrous sulfate 325 (65 FE) MG tablet [id number] Yes Take 325 mg by mouth daily. [provider] Taking Active Self  ?fluticasone (FLONASE) 50 MCG/ACT nasal spray [id number] Yes Place 1 spray into both nostrils daily as needed. [provider] Taking Active Self  ?gabapentin (NEURONTIN) 600 MG tablet [id number] Yes Take 1 tablet (600 mg total) by mouth at bedtime.  ?Patient taking differently: Take 600 mg by mouth 2 (two) times daily.  ? [place], Max T, [place] Taking Active Self  ?hydrALAZINE (APRESOLINE) 100 MG tablet [id number] Yes TAKE 1 TABLET BY MOUTH THREE TIMES DAILY  ?Patient taking differently: Take 100 mg by mouth 3 (three) times daily.  ? Adrian Prows, MD Taking Active Self  ?ipratropium-albuterol (DUONEB) 0.5-2.5 (3) MG/3ML SOLN [id number] Yes Take 3 mLs by nebulization 3 (three) times daily. Donne Hazel, MD Taking Active Self  ?losartan (COZAAR) 100 MG tablet [id number] Yes Take 100 mg by mouth daily. [provider] Taking Active Self  ?metFORMIN (GLUCOPHAGE) 500 MG tablet [id number] Yes Take 0.5 tablets (250 mg total) by mouth 2 (two) times daily with a meal. Adrian Prows, MD Taking Active Self  ?MYRBETRIQ 25 MG TB24 tablet [id number] Yes Take 25 mg by mouth daily. [provider] Taking Active Self  ?nitroGLYCERIN (NITROSTAT) 0.4 MG SL tablet [id number]  Place 1 tablet (0.4 mg total) under the tongue every 5 (five) minutes as needed for chest pain.  ?Patient not taking: Reported on 08/23/2021  ? Miquel Dunn, NP  Expired 08/14/21 2359 Self  ?         ?  Med Note (WHITE, Wynn Maudlin Aug 23, 2021 10:33 PM)    ?pantoprazole (PROTONIX) 40 MG tablet [id number] Yes Take 40 mg by mouth daily. [provider] Taking Active Self  ?predniSONE (DELTASONE) 10 MG tablet [id number] Yes Takes 6 tablets for 1 days, then 5 tablets for 1 days, then 4 tablets for 1 days, then 3 tablets for 1 days, then 2 tabs for 1 days, then 1 tab  for 1 days, and then stop. Charlynne Cousins, MD Taking Active   ?spironolactone (ALDACTONE) 50 MG tablet [id number] Yes TAKE 1 TABLET(50 MG) BY MOUTH DAILY  ?Patient taking differently: Take 50 mg by mouth daily.  ? Rex Kras, DO Taking Active Self  ? ?  ?  ? ?  ? ? ? ?SDOH:  (Social Determinants of Health) assessments and interventions performed:  ? ? ? ?Care Plan ? ?Review of patient past medical history, allergies, medications, health status, including review of consultants reports, laboratory and other test data, was performed as part of comprehensive evaluation for care management services.  ? ?Care Plan : RN Care Manager Plan of Care  ?Updates made by Tobi Bastos, RN since 09/05/2021 12:00 AM  ?  ? ?Problem: Knowledg Deficit related to COPD management and care coordination needs.   ?Priority: High  ?  ? ?Long-Range Goal: Development of plan of care for manament of COPD   ?Start Date: 08/31/2021  ?Expected End Date: 02/21/2022  ?This Visit's Progress: On track  ?Priority: High  ?Note:   ?Current Barriers:  ?Knowledge Deficits related to plan of care for management of COPD  ? ?RNCM Clinical Goal(s):  ?Patient will verbalize understanding of plan for management of COPD as evidenced by self report and chart review  through collaboration with RN Care manager, provider, and care team.  ? ?Interventions: ?Inter-disciplinary care team collaboration (see longitudinal plan of care) ?Evaluation of current treatment plan related to  self management and patient's adherence to plan as established by provider ? ? ?COPD Interventions:  (Status:  New goal.) Long Term Goal ?Provided patient with basic written and verbal COPD education on self care/management/and exacerbation prevention ?Advised patient to track and manage COPD triggers ?Provided written and verbal instructions on pursed lip breathing and utilized returned demonstration as teach back ?Provided instruction about proper use of medications used for  management of COPD including inhalers ?Advised patient to self assesses COPD action plan zone and make appointment with provider if in the yellow zone for 48 hours without improvement ?Provided education about and advised patient to utilize infection prevention strategies to reduce risk of respiratory infection ?Discussed the importance of adequate rest and management of fatigue with COPD ?Screening for signs and symptoms of depression related to chronic disease state  ?Assessed social determinant of health barriers ? ?TOC week #3-3/15: Pt continues to do well however "alittle under the weather" today but remains in the GREEN zone with her ongoing COPD and management of care. Continue to have support from her daughter. No acute issues or related symptoms with exacerbation related to her COPD. Pt remains aware to follow up with provider with acute needs in the YELLOW zone. Will follow up next week with ongoing transition of care call next week. ? ?Patient Goals/Self-Care Activities: ?Take all medications as prescribed ?Attend all scheduled provider appointments ?Call pharmacy for medication refills 3-7 days in advance of running out of medications ?Attend church or other social activities ?Perform all self care activities independently  ?Perform IADL's (shopping,  preparing meals, housekeeping, managing finances) independently ?Call provider office for new concerns or questions  ?eliminate smoking in my home ?identify and avoid work-related triggers ?identify and remove indoor air pollutants ?limit outdoor activity during cold weather ?develop a rescue plan ?eliminate symptom triggers at home ?follow rescue plan if symptoms flare-up ?keep follow-up appointments: Reminder to contact provider on Monday to schedule an appointment for post-op hospitalization. ?don't eat or exercise right before bedtime ?use devices that will help like a cane, sock-puller or reacher ?do breathing exercises every day ? ?Follow Up Plan:   Telephone follow up appointment with care management team member scheduled for:  09/13/2021 Ongoing Transition of care ?The patient has been provided with contact information for the care management team and ha

## 2021-09-10 ENCOUNTER — Encounter: Payer: Self-pay | Admitting: Cardiology

## 2021-09-10 ENCOUNTER — Ambulatory Visit: Payer: Medicare Other | Admitting: Cardiology

## 2021-09-10 ENCOUNTER — Other Ambulatory Visit: Payer: Self-pay

## 2021-09-10 VITALS — BP 107/52 | HR 88 | Temp 97.0°F | Resp 16 | Ht 60.0 in | Wt 203.0 lb

## 2021-09-10 DIAGNOSIS — Z87891 Personal history of nicotine dependence: Secondary | ICD-10-CM

## 2021-09-10 DIAGNOSIS — I2510 Atherosclerotic heart disease of native coronary artery without angina pectoris: Secondary | ICD-10-CM

## 2021-09-10 DIAGNOSIS — E782 Mixed hyperlipidemia: Secondary | ICD-10-CM | POA: Diagnosis not present

## 2021-09-10 DIAGNOSIS — I10 Essential (primary) hypertension: Secondary | ICD-10-CM | POA: Diagnosis not present

## 2021-09-10 DIAGNOSIS — E1151 Type 2 diabetes mellitus with diabetic peripheral angiopathy without gangrene: Secondary | ICD-10-CM

## 2021-09-10 DIAGNOSIS — I6523 Occlusion and stenosis of bilateral carotid arteries: Secondary | ICD-10-CM

## 2021-09-10 DIAGNOSIS — E119 Type 2 diabetes mellitus without complications: Secondary | ICD-10-CM

## 2021-09-10 DIAGNOSIS — I739 Peripheral vascular disease, unspecified: Secondary | ICD-10-CM | POA: Diagnosis not present

## 2021-09-10 MED ORDER — CILOSTAZOL 100 MG PO TABS: 100.0000 mg | ORAL_TABLET | Freq: Two times a day (BID) | ORAL | 1 refills | Status: DC

## 2021-09-10 NOTE — Progress Notes (Signed)
? ?Bailie Loa Idler ?Date of Birth: December 22, 1950 ?MRN: [id number] ?Primary Care Provider:Osei-Bonsu, Iona Beard, MD ?Former Cardiology Providers: Jeri Lager, APRN, FNP-C ?Primary Cardiologist: Rex Kras, DO, [facility] (established care 08/30/2019) ? ?Date: 09/10/21 ?Last Office Visit: 08/14/2021 ? ?Chief Complaint  ?Patient presents with  ? Hospitalization Follow-up  ? peripheral artery disease  ? ? ?HPI  ?Shalin Tequisha Maahs is a 71 y.o.  female whose past medical history and cardiovascular risk factors include: Established coronary artery disease without angina pectoris, hypertension, hyperlipidemia, former smoker, peripheral artery disease status post PV angiogram with revascularization of the SFA, non-insulin-dependent diabetes, postmenopausal female, advanced age, obesity due to excess calories, history of prior GI bleed, COPD on 2 L nasal cannula oxygen. ? ?Patient being followed by the practice for management of CAD, carotid artery disease, and PAD. ? ?During the last office visit patient was experiencing right lower extremity pain which she described as " horse needle poked me."  Pain is more pronounced with effort related activities and improves with resting.  She does have history of right SFA intervention and therefore was recommended to undergo ankle-brachial index and arterial duplex.  Results reviewed with her in great detail and noted below for further reference.  She was also started on cilostazol 50 mg p.o. twice daily and now presents for follow-up. ? ?Patient continues to have claudication but her symptoms have improved.  Since last office visit she has stopped smoking and is tolerating cilostazol well.  Unfortunately she was hospitalized for COPD exacerbation and now is on chronic O2 which is new for her. ? ?ALLERGIES: ?No Known Allergies ? ?MEDICATION LIST PRIOR TO VISIT: ?Current Outpatient Medications on File Prior to Visit  ?Medication Sig Dispense Refill  ? acetaminophen (TYLENOL) 500 MG  tablet Take 1,000 mg by mouth daily as needed for moderate pain.    ? albuterol (PROVENTIL HFA;VENTOLIN HFA) 108 (90 Base) MCG/ACT inhaler Inhale 1-2 puffs into the lungs every 6 (six) hours as needed for wheezing or shortness of breath.    ? [START ON 09/19/2021] amLODipine (NORVASC) 10 MG tablet Take 1 tablet (10 mg total) by mouth daily.    ? ASPIRIN LOW DOSE 81 MG EC tablet TAKE 1 TABLET(81 MG) BY MOUTH DAILY (Patient taking differently: Take 81 mg by mouth daily.) 30 tablet 0  ? atorvastatin (LIPITOR) 80 MG tablet Take 80 mg by mouth daily.    ? ezetimibe (ZETIA) 10 MG tablet Take 1 tablet (10 mg total) by mouth daily. 90 tablet 0  ? ferrous sulfate 325 (65 FE) MG tablet Take 325 mg by mouth daily.    ? fluticasone (FLONASE) 50 MCG/ACT nasal spray Place 1 spray into both nostrils daily as needed.    ? gabapentin (NEURONTIN) 600 MG tablet Take 1 tablet (600 mg total) by mouth at bedtime. (Patient taking differently: Take 600 mg by mouth 2 (two) times daily.) 30 tablet 11  ? hydrALAZINE (APRESOLINE) 100 MG tablet TAKE 1 TABLET BY MOUTH THREE TIMES DAILY (Patient taking differently: Take 100 mg by mouth 3 (three) times daily.) 270 tablet 1  ? ipratropium-albuterol (DUONEB) 0.5-2.5 (3) MG/3ML SOLN Take 3 mLs by nebulization 3 (three) times daily. 270 mL 0  ? losartan (COZAAR) 100 MG tablet Take 100 mg by mouth daily.    ? metFORMIN (GLUCOPHAGE) 500 MG tablet Take 0.5 tablets (250 mg total) by mouth 2 (two) times daily with a meal.    ? MYRBETRIQ 25 MG TB24 tablet Take 25 mg by mouth daily.    ?  nitroGLYCERIN (NITROSTAT) 0.4 MG SL tablet Place 1 tablet (0.4 mg total) under the tongue every 5 (five) minutes as needed for chest pain. 90 tablet 3  ? pantoprazole (PROTONIX) 40 MG tablet Take 40 mg by mouth daily.    ? spironolactone (ALDACTONE) 50 MG tablet TAKE 1 TABLET(50 MG) BY MOUTH DAILY (Patient taking differently: Take 50 mg by mouth daily.) 90 tablet 3  ? ?No current facility-administered medications on file  prior to visit.  ? ? ?PAST MEDICAL HISTORY: ?Past Medical History:  ?Diagnosis Date  ? Anemia 12/26/2015  ? Anxiety   ? Arthritis   ? Blood transfusion without reported diagnosis   ? Chronic pain   ? resolved per pt 04/12/20  ? COPD (chronic obstructive pulmonary disease) ([place])   ? Coronary artery disease   ? Depression   ? Diabetes mellitus without complication ([place])   ? type 2  ? GERD (gastroesophageal reflux disease)   ? Heart murmur   ? since birth; Echo 02/18/19: LVEF 84%, grade 1 diastolic dysfunction, mild AS (mean grad 12 mmHg), trace MR/PR, mild TR, PASP 32 mmHg    ? Hyperlipemia   ? Hypertension   ? PVD (peripheral vascular disease) ([place])   ? right SFA stent 02/11/17 by Dr. Einar Gip  ? Sleep apnea   ? does not use cpap  ? Wears glasses   ? ? ?PAST SURGICAL HISTORY: ?Past Surgical History:  ?Procedure Laterality Date  ? ABDOMINAL HYSTERECTOMY    ? CARDIAC CATHETERIZATION    ? CATARACT EXTRACTION    ? CESAREAN SECTION    ? COLONOSCOPY N/A 01/08/2013  ? Procedure: COLONOSCOPY;  Surgeon: Beryle Beams, MD;  Location: [facility] ENDOSCOPY;  Service: Endoscopy;  Laterality: N/A;  ? COLONOSCOPY N/A 12/28/2015  ? Procedure: COLONOSCOPY;  Surgeon: Carol Ada, MD;  Location: [facility] ENDOSCOPY;  Service: Endoscopy;  Laterality: N/A;  ? COLONOSCOPY WITH PROPOFOL N/A 10/05/2020  ? Procedure: COLONOSCOPY WITH PROPOFOL;  Surgeon: Carol Ada, MD;  Location: [facility] ENDOSCOPY;  Service: Endoscopy;  Laterality: N/A;  ? ENTEROSCOPY N/A 12/28/2015  ? Procedure: ENTEROSCOPY;  Surgeon: Carol Ada, MD;  Location: [place];  Service: Endoscopy;  Laterality: N/A;  ? ENTEROSCOPY N/A 02/20/2018  ? Procedure: ENTEROSCOPY;  Surgeon: Carol Ada, MD;  Location: [facility] ENDOSCOPY;  Service: Endoscopy;  Laterality: N/A;  ? ENTEROSCOPY N/A 03/27/2018  ? Procedure: ENTEROSCOPY;  Surgeon: Carol Ada, MD;  Location: [facility] ENDOSCOPY;  Service: Endoscopy;  Laterality: N/A;  ? ENTEROSCOPY N/A 10/05/2020  ? Procedure: ENTEROSCOPY;  Surgeon: Carol Ada, MD;   Location: [facility] ENDOSCOPY;  Service: Endoscopy;  Laterality: N/A;  ? ENTEROSCOPY N/A 05/11/2021  ? Procedure: ENTEROSCOPY;  Surgeon: Carol Ada, MD;  Location: [facility] ENDOSCOPY;  Service: Endoscopy;  Laterality: N/A;  ? ESOPHAGOGASTRODUODENOSCOPY N/A 01/08/2013  ? Procedure: ESOPHAGOGASTRODUODENOSCOPY (EGD);  Surgeon: Beryle Beams, MD;  Location: [facility] ENDOSCOPY;  Service: Endoscopy;  Laterality: N/A;  ? EYE SURGERY    ? HAND SURGERY    ? HEMOSTASIS CLIP PLACEMENT  05/11/2021  ? Procedure: HEMOSTASIS CLIP PLACEMENT;  Surgeon: Carol Ada, MD;  Location: [facility] ENDOSCOPY;  Service: Endoscopy;;  ? HOT HEMOSTASIS N/A 12/28/2015  ? Procedure: HOT HEMOSTASIS (ARGON PLASMA COAGULATION/BICAP);  Surgeon: Carol Ada, MD;  Location: [facility] ENDOSCOPY;  Service: Endoscopy;  Laterality: N/A;  ? HOT HEMOSTASIS N/A 02/20/2018  ? Procedure: HOT HEMOSTASIS (ARGON PLASMA COAGULATION/BICAP);  Surgeon: Carol Ada, MD;  Location: [facility] ENDOSCOPY;  Service: Endoscopy;  Laterality: N/A;  ? HOT HEMOSTASIS N/A 03/27/2018  ? Procedure: HOT  HEMOSTASIS (ARGON PLASMA COAGULATION/BICAP);  Surgeon: Carol Ada, MD;  Location: [facility] ENDOSCOPY;  Service: Endoscopy;  Laterality: N/A;  ? HOT HEMOSTASIS N/A 10/05/2020  ? Procedure: HOT HEMOSTASIS (ARGON PLASMA COAGULATION/BICAP);  Surgeon: Carol Ada, MD;  Location: [facility] ENDOSCOPY;  Service: Endoscopy;  Laterality: N/A;  ? HOT HEMOSTASIS N/A 05/11/2021  ? Procedure: HOT HEMOSTASIS (ARGON PLASMA COAGULATION/BICAP);  Surgeon: Carol Ada, MD;  Location: [facility] ENDOSCOPY;  Service: Endoscopy;  Laterality: N/A;  ? LEFT HEART CATHETERIZATION WITH CORONARY ANGIOGRAM N/A 03/09/2013  ? Procedure: LEFT HEART CATHETERIZATION WITH CORONARY ANGIOGRAM;  Surgeon: Laverda Page, MD;  Location: [facility] CATH LAB;  Service: Cardiovascular;  Laterality: N/A;  ? LOWER EXTREMITY ANGIOGRAM N/A 12/29/2012  ? Procedure: LOWER EXTREMITY ANGIOGRAM;  Surgeon: Laverda Page, MD;  Location: [facility] CATH LAB;  Service: Cardiovascular;  Laterality: N/A;   ? LOWER EXTREMITY ANGIOGRAM N/A 10/05/2013  ? Procedure: LOWER EXTREMITY ANGIOGRAM;  Surgeon: Laverda Page, MD;  Location: [facility] CATH LAB;  Service: Cardiovascular;  Laterality: N/A;  ? LOWER EXTREMITY ANGIOGRAPH

## 2021-09-13 ENCOUNTER — Ambulatory Visit: Payer: Self-pay | Admitting: *Deleted

## 2021-09-17 ENCOUNTER — Other Ambulatory Visit: Payer: Self-pay | Admitting: *Deleted

## 2021-09-17 NOTE — Patient Outreach (Signed)
[facility]) Care Management ? ?09/17/2021 ? ?[name] ?10-10-1950 ?[id number] ? ?Transition of care-Unsuccessful ? ?RN attempted outreach however unsuccessful. RN able to leave a HIPAA approved voice message requesting a call back. ? ?Will rescheduled another outreach call over the next week for ongoing [facility] services. ? ?[name], RN ?Care Management Coordinator ?[place] ?Main Office [phone]  ? ?

## 2021-09-18 ENCOUNTER — Other Ambulatory Visit: Payer: Self-pay | Admitting: *Deleted

## 2021-09-18 NOTE — Patient Outreach (Signed)
?[facility]) Care Management ?Telephonic RN Care Manager Note ? ? ?09/18/2021 ?Name:  [name] MRN:  [id number] DOB:  Sep 15, 1950 ? ?Summary: ?Pt had a recent hospital visit 3/22 for "coughing up blood". Pt much improved with no additional symptoms. Pt continue to take all COPD medications and uses her home O2 remaining in the GREEN zone.  ? ?Recommendations/Changes made from today's visit: ?Will continue to verified ongoing plan of care and verified pt remains in the GREEN zone with no residual symptoms. Will encouraged pt contact her insurance carrier for covered on the requested DME. ? ?Subjective: ?[name] is an 71 y.o. year old female who is a primary patient of Osei-Bonsu, Iona Beard, MD. The care management team was consulted for assistance with care management and/or care coordination needs.   ? ?Telephonic RN Care Manager completed Telephone Visit today. ? ?Objective:  ? ?Medications Reviewed Today   ? ? Reviewed by Rex Kras, DO (Physician) on 09/10/21 at 1359  Med List Status: <None>  ? ?Medication Order Taking? Sig Documenting Provider Last Dose Status Informant  ?acetaminophen (TYLENOL) 500 MG tablet [id number] Yes Take 1,000 mg by mouth daily as needed for moderate pain. [provider] Taking Active Self  ?albuterol (PROVENTIL HFA;VENTOLIN HFA) 108 (90 Base) MCG/ACT inhaler [id number] Yes Inhale 1-2 puffs into the lungs every 6 (six) hours as needed for wheezing or shortness of breath. [provider] Taking Active Self  ?amLODipine (NORVASC) 10 MG tablet [id number] Yes Take 1 tablet (10 mg total) by mouth daily. Charlynne Cousins, MD Taking Active   ?ASPIRIN LOW DOSE 81 MG EC tablet [id number] Yes TAKE 1 TABLET(81 MG) BY MOUTH DAILY  ?Patient taking differently: Take 81 mg by mouth daily.  ? Magnant, Gerrianne Scale, PA-C Taking Active Self  ?atorvastatin (LIPITOR) 80 MG tablet [id number] Yes Take 80 mg by mouth daily. [provider]  Taking Active Self  ?cilostazol (PLETAL) 100 MG tablet [id number]  Take 1 tablet (100 mg total) by mouth 2 (two) times daily. Terri Skains, [name], DO  Active   ?ezetimibe (ZETIA) 10 MG tablet [id number] Yes Take 1 tablet (10 mg total) by mouth daily. Rex Kras, DO Taking Active Self  ?ferrous sulfate 325 (65 FE) MG tablet [id number] Yes Take 325 mg by mouth daily. [provider] Taking Active Self  ?fluticasone (FLONASE) 50 MCG/ACT nasal spray [id number] Yes Place 1 spray into both nostrils daily as needed. [provider] Taking Active Self  ?gabapentin (NEURONTIN) 600 MG tablet [id number] Yes Take 1 tablet (600 mg total) by mouth at bedtime.  ?Patient taking differently: Take 600 mg by mouth 2 (two) times daily.  ? [place], Max T, [place] Taking Active Self  ?hydrALAZINE (APRESOLINE) 100 MG tablet [id number] Yes TAKE 1 TABLET BY MOUTH THREE TIMES DAILY  ?Patient taking differently: Take 100 mg by mouth 3 (three) times daily.  ? Adrian Prows, MD Taking Active Self  ?ipratropium-albuterol (DUONEB) 0.5-2.5 (3) MG/3ML SOLN [id number] Yes Take 3 mLs by nebulization 3 (three) times daily. Donne Hazel, MD Taking Active Self  ?losartan (COZAAR) 100 MG tablet [id number] Yes Take 100 mg by mouth daily. [provider] Taking Active Self  ?metFORMIN (GLUCOPHAGE) 500 MG tablet [id number] Yes Take 0.5 tablets (250 mg total) by mouth 2 (two) times daily with a meal. Adrian Prows, MD Taking Active Self  ?MYRBETRIQ 25 MG TB24 tablet [id number] Yes Take 25 mg by mouth daily. [provider] Taking Active Self  ?nitroGLYCERIN (NITROSTAT) 0.4 MG  SL tablet [id number] Yes Place 1 tablet (0.4 mg total) under the tongue every 5 (five) minutes as needed for chest pain. Miquel Dunn, NP Taking Active Self  ?         ?Med Note (WHITE, Wynn Maudlin Aug 23, 2021 10:33 PM)    ?pantoprazole (PROTONIX) 40 MG tablet [id number] Yes Take 40 mg by mouth daily. [provider] Taking Active Self  ?spironolactone  (ALDACTONE) 50 MG tablet [id number] Yes TAKE 1 TABLET(50 MG) BY MOUTH DAILY  ?Patient taking differently: Take 50 mg by mouth daily.  ? Rex Kras, DO Taking Active Self  ? ?  ?  ? ?  ? ? ? ?SDOH:  (Social Determinants of Health) assessments and interventions performed:  ? ? ? ?Care Plan ? ?Review of patient past medical history, allergies, medications, health status, including review of consultants reports, laboratory and other test data, was performed as part of comprehensive evaluation for care management services.  ? ?Care Plan : RN Care Manager Plan of Care  ?Updates made by Tobi Bastos, RN since 09/18/2021 12:00 AM  ?  ? ?Problem: Knowledg Deficit related to COPD management and care coordination needs.   ?Priority: High  ?  ? ?Long-Range Goal: Development of plan of care for manament of COPD   ?Start Date: 08/31/2021  ?Expected End Date: 02/21/2022  ?This Visit's Progress: On track  ?Recent Progress: On track  ?Priority: High  ?Note:   ?Current Barriers:  ?Knowledge Deficits related to plan of care for management of COPD  ? ?RNCM Clinical Goal(s):  ?Patient will verbalize understanding of plan for management of COPD as evidenced by self report and chart review  through collaboration with RN Care manager, provider, and care team.  ? ?Interventions: ?Inter-disciplinary care team collaboration (see longitudinal plan of care) ?Evaluation of current treatment plan related to  self management and patient's adherence to plan as established by provider ? ? ?COPD Interventions:  (Status:  New goal.) Long Term Goal ?Provided patient with basic written and verbal COPD education on self care/management/and exacerbation prevention ?Advised patient to track and manage COPD triggers ?Provided written and verbal instructions on pursed lip breathing and utilized returned demonstration as teach back ?Provided instruction about proper use of medications used for management of COPD including inhalers ?Advised patient to self  assesses COPD action plan zone and make appointment with provider if in the yellow zone for 48 hours without improvement ?Provided education about and advised patient to utilize infection prevention strategies to reduce risk of respiratory infection ?Discussed the importance of adequate rest and management of fatigue with COPD ?Screening for signs and symptoms of depression related to chronic disease state  ?Assessed social determinant of health barriers ? ?TOC week #3-3/15: Pt continues to do well however "alittle under the weather" today but remains in the GREEN zone with her ongoing COPD and management of care. Continue to have support from her daughter. No acute issues or related symptoms with exacerbation related to her COPD. Pt remains aware to follow up with provider with acute needs in the YELLOW zone. Will follow up next week with ongoing transition of care call next week. ?TOC week #4-3/28: Readmit on 3/22 "coughing up blood". Pt denies any additional symptoms however continue to utilize her ongoing COPD treatment and home O2 with some sputum that is clear, no additional bleeding. Pt feeling better and verifies she is taking all her prescribed medications with no acute issues and sufficient transportation to  all medical appointments. Spoke with pt today and who inquired on received DME for a pure-wick and hospital bed. This has been discussed in the pass and pt  has spoken with her provider and started this process however DME agency having issues with getting pt's insurance to cover this request. Pt aware to call her insurance customer services for assist with DME coverage. Pt called [facility] hotline today assuming this is something that we can approved. Pt educated on services once again and the use of the [facility] RN hotline, daughter also updated as they were not aware. No other issues reported. RN reiterated on the plan of care and answer all inquires and questions. Strongly encourage adherence with pt's  following the plan of care. Will follow up next week with any acute needs and continue to address issues accordingly.  ? ?Patient Goals/Self-Care Activities: ?Take all medications as prescribed ?Attend all scheduled

## 2021-09-19 DIAGNOSIS — J449 Chronic obstructive pulmonary disease, unspecified: Secondary | ICD-10-CM | POA: Diagnosis not present

## 2021-09-19 DIAGNOSIS — N3281 Overactive bladder: Secondary | ICD-10-CM | POA: Diagnosis not present

## 2021-09-19 DIAGNOSIS — I959 Hypotension, unspecified: Secondary | ICD-10-CM | POA: Diagnosis not present

## 2021-09-19 DIAGNOSIS — D500 Iron deficiency anemia secondary to blood loss (chronic): Secondary | ICD-10-CM | POA: Diagnosis not present

## 2021-09-19 DIAGNOSIS — R9431 Abnormal electrocardiogram [ECG] [EKG]: Secondary | ICD-10-CM | POA: Diagnosis not present

## 2021-09-19 DIAGNOSIS — I10 Essential (primary) hypertension: Secondary | ICD-10-CM | POA: Diagnosis not present

## 2021-09-19 DIAGNOSIS — R051 Acute cough: Secondary | ICD-10-CM | POA: Diagnosis not present

## 2021-09-21 ENCOUNTER — Other Ambulatory Visit: Payer: Self-pay

## 2021-09-21 ENCOUNTER — Encounter (HOSPITAL_COMMUNITY): Payer: Self-pay | Admitting: Emergency Medicine

## 2021-09-21 ENCOUNTER — Inpatient Hospital Stay (HOSPITAL_COMMUNITY)
Admission: EM | Admit: 2021-09-21 | Discharge: 2021-09-25 | DRG: 377 | Disposition: A | Discharge status: Home / Self Care | Payer: Medicare Other | Attending: Internal Medicine | Admitting: Internal Medicine

## 2021-09-21 ENCOUNTER — Ambulatory Visit: Payer: Self-pay | Admitting: *Deleted

## 2021-09-21 ENCOUNTER — Emergency Department (HOSPITAL_COMMUNITY): Payer: Medicare Other

## 2021-09-21 DIAGNOSIS — E785 Hyperlipidemia, unspecified: Secondary | ICD-10-CM | POA: Diagnosis not present

## 2021-09-21 DIAGNOSIS — D649 Anemia, unspecified: Principal | ICD-10-CM

## 2021-09-21 DIAGNOSIS — Z87891 Personal history of nicotine dependence: Secondary | ICD-10-CM | POA: Diagnosis not present

## 2021-09-21 DIAGNOSIS — K921 Melena: Secondary | ICD-10-CM | POA: Diagnosis not present

## 2021-09-21 DIAGNOSIS — Z7982 Long term (current) use of aspirin: Secondary | ICD-10-CM

## 2021-09-21 DIAGNOSIS — D62 Acute posthemorrhagic anemia: Secondary | ICD-10-CM | POA: Diagnosis present

## 2021-09-21 DIAGNOSIS — Z8249 Family history of ischemic heart disease and other diseases of the circulatory system: Secondary | ICD-10-CM

## 2021-09-21 DIAGNOSIS — I10 Essential (primary) hypertension: Secondary | ICD-10-CM | POA: Diagnosis not present

## 2021-09-21 DIAGNOSIS — K3189 Other diseases of stomach and duodenum: Secondary | ICD-10-CM | POA: Diagnosis not present

## 2021-09-21 DIAGNOSIS — U071 COVID-19: Secondary | ICD-10-CM | POA: Diagnosis present

## 2021-09-21 DIAGNOSIS — J449 Chronic obstructive pulmonary disease, unspecified: Secondary | ICD-10-CM | POA: Diagnosis present

## 2021-09-21 DIAGNOSIS — F32A Depression, unspecified: Secondary | ICD-10-CM | POA: Diagnosis present

## 2021-09-21 DIAGNOSIS — R011 Cardiac murmur, unspecified: Secondary | ICD-10-CM | POA: Diagnosis present

## 2021-09-21 DIAGNOSIS — K219 Gastro-esophageal reflux disease without esophagitis: Secondary | ICD-10-CM | POA: Diagnosis not present

## 2021-09-21 DIAGNOSIS — K319 Disease of stomach and duodenum, unspecified: Secondary | ICD-10-CM | POA: Diagnosis present

## 2021-09-21 DIAGNOSIS — D75839 Thrombocytosis, unspecified: Secondary | ICD-10-CM | POA: Diagnosis not present

## 2021-09-21 DIAGNOSIS — K5520 Angiodysplasia of colon without hemorrhage: Secondary | ICD-10-CM | POA: Diagnosis not present

## 2021-09-21 DIAGNOSIS — Z9981 Dependence on supplemental oxygen: Secondary | ICD-10-CM | POA: Diagnosis not present

## 2021-09-21 DIAGNOSIS — F419 Anxiety disorder, unspecified: Secondary | ICD-10-CM | POA: Diagnosis present

## 2021-09-21 DIAGNOSIS — G4733 Obstructive sleep apnea (adult) (pediatric): Secondary | ICD-10-CM | POA: Diagnosis present

## 2021-09-21 DIAGNOSIS — D500 Iron deficiency anemia secondary to blood loss (chronic): Secondary | ICD-10-CM | POA: Diagnosis not present

## 2021-09-21 DIAGNOSIS — K922 Gastrointestinal hemorrhage, unspecified: Secondary | ICD-10-CM | POA: Diagnosis present

## 2021-09-21 DIAGNOSIS — J9811 Atelectasis: Secondary | ICD-10-CM | POA: Diagnosis present

## 2021-09-21 DIAGNOSIS — T183XXA Foreign body in small intestine, initial encounter: Secondary | ICD-10-CM | POA: Diagnosis not present

## 2021-09-21 DIAGNOSIS — E669 Obesity, unspecified: Secondary | ICD-10-CM | POA: Diagnosis present

## 2021-09-21 DIAGNOSIS — K5521 Angiodysplasia of colon with hemorrhage: Secondary | ICD-10-CM | POA: Diagnosis not present

## 2021-09-21 DIAGNOSIS — Z79899 Other long term (current) drug therapy: Secondary | ICD-10-CM

## 2021-09-21 DIAGNOSIS — E876 Hypokalemia: Secondary | ICD-10-CM | POA: Diagnosis not present

## 2021-09-21 DIAGNOSIS — I739 Peripheral vascular disease, unspecified: Secondary | ICD-10-CM | POA: Diagnosis present

## 2021-09-21 DIAGNOSIS — D509 Iron deficiency anemia, unspecified: Secondary | ICD-10-CM | POA: Diagnosis not present

## 2021-09-21 DIAGNOSIS — Z96652 Presence of left artificial knee joint: Secondary | ICD-10-CM | POA: Diagnosis not present

## 2021-09-21 DIAGNOSIS — R079 Chest pain, unspecified: Secondary | ICD-10-CM

## 2021-09-21 DIAGNOSIS — Z833 Family history of diabetes mellitus: Secondary | ICD-10-CM

## 2021-09-21 DIAGNOSIS — E1151 Type 2 diabetes mellitus with diabetic peripheral angiopathy without gangrene: Secondary | ICD-10-CM | POA: Diagnosis present

## 2021-09-21 DIAGNOSIS — R0789 Other chest pain: Secondary | ICD-10-CM | POA: Diagnosis not present

## 2021-09-21 DIAGNOSIS — J9611 Chronic respiratory failure with hypoxia: Secondary | ICD-10-CM | POA: Diagnosis not present

## 2021-09-21 DIAGNOSIS — Z7984 Long term (current) use of oral hypoglycemic drugs: Secondary | ICD-10-CM

## 2021-09-21 DIAGNOSIS — I2510 Atherosclerotic heart disease of native coronary artery without angina pectoris: Secondary | ICD-10-CM | POA: Diagnosis not present

## 2021-09-21 DIAGNOSIS — E119 Type 2 diabetes mellitus without complications: Secondary | ICD-10-CM

## 2021-09-21 DIAGNOSIS — I517 Cardiomegaly: Secondary | ICD-10-CM | POA: Diagnosis not present

## 2021-09-21 DIAGNOSIS — Z6839 Body mass index (BMI) 39.0-39.9, adult: Secondary | ICD-10-CM

## 2021-09-21 DIAGNOSIS — R933 Abnormal findings on diagnostic imaging of other parts of digestive tract: Secondary | ICD-10-CM | POA: Diagnosis not present

## 2021-09-21 DIAGNOSIS — R0602 Shortness of breath: Secondary | ICD-10-CM | POA: Diagnosis not present

## 2021-09-21 LAB — COMPREHENSIVE METABOLIC PANEL
ALT: 14 U/L (ref 0–44)
AST: 13 U/L — ABNORMAL LOW (ref 15–41)
Albumin: 2.8 g/dL — ABNORMAL LOW (ref 3.5–5.0)
Alkaline Phosphatase: 46 U/L (ref 38–126)
Anion gap: 6 (ref 5–15)
BUN: 10 mg/dL (ref 8–23)
CO2: 27 mmol/L (ref 22–32)
Calcium: 8.8 mg/dL — ABNORMAL LOW (ref 8.9–10.3)
Chloride: 108 mmol/L (ref 98–111)
Creatinine, Ser: 0.82 mg/dL (ref 0.44–1.00)
GFR, Estimated: >60 mL/min (ref >60)
Glucose, Bld: 124 mg/dL — ABNORMAL HIGH (ref 70–99)
Potassium: 3.7 mmol/L (ref 3.5–5.1)
Sodium: 141 mmol/L (ref 135–145)
Total Bilirubin: 0.6 mg/dL (ref 0.3–1.2)
Total Protein: 6.2 g/dL — ABNORMAL LOW (ref 6.5–8.1)

## 2021-09-21 LAB — PROTIME-INR
INR: 1.1 (ref 0.8–1.2)
Prothrombin Time: 14.5 seconds (ref 11.4–15.2)

## 2021-09-21 LAB — CBC
HCT: 20.3 % — ABNORMAL LOW (ref 36.0–46.0)
Hemoglobin: 5.3 g/dL — CL (ref 12.0–15.0)
MCH: 22 pg — ABNORMAL LOW (ref 26.0–34.0)
MCHC: 26.1 g/dL — ABNORMAL LOW (ref 30.0–36.0)
MCV: 84.2 fL (ref 80.0–100.0)
Platelets: 795 10*3/uL — ABNORMAL HIGH (ref 150–400)
RBC: 2.41 MIL/uL — ABNORMAL LOW (ref 3.87–5.11)
RDW: 25.8 % — ABNORMAL HIGH (ref 11.5–15.5)
WBC: 8.9 10*3/uL (ref 4.0–10.5)
nRBC: 0.2 % (ref 0.0–0.2)

## 2021-09-21 LAB — HEMOGLOBIN A1C
Hgb A1c MFr Bld: 4.9 % (ref 4.8–5.6)
Mean Plasma Glucose: 93.93 mg/dL

## 2021-09-21 LAB — RETICULOCYTES
Immature Retic Fract: 42.8 % — ABNORMAL HIGH (ref 2.3–15.9)
RBC.: 2.41 MIL/uL — ABNORMAL LOW (ref 3.87–5.11)
Retic Count, Absolute: 134.5 10*3/uL (ref 19.0–186.0)
Retic Ct Pct: 5.6 % — ABNORMAL HIGH (ref 0.4–3.1)

## 2021-09-21 LAB — TROPONIN I (HIGH SENSITIVITY): Troponin I (High Sensitivity): 14 ng/L (ref <18)

## 2021-09-21 LAB — IRON AND TIBC
Iron: 33 ug/dL (ref 28–170)
Saturation Ratios: 8 % — ABNORMAL LOW (ref 10.4–31.8)
TIBC: 406 ug/dL (ref 250–450)
UIBC: 373 ug/dL

## 2021-09-21 LAB — CBG MONITORING, ED: Glucose-Capillary: 79 mg/dL (ref 70–99)

## 2021-09-21 LAB — PREPARE RBC (CROSSMATCH)

## 2021-09-21 LAB — FOLATE: Folate: 13.7 ng/mL (ref >5.9)

## 2021-09-21 LAB — VITAMIN B12: Vitamin B-12: 481 pg/mL (ref 180–914)

## 2021-09-21 LAB — POC OCCULT BLOOD, ED: Fecal Occult Bld: POSITIVE — AB

## 2021-09-21 LAB — FERRITIN: Ferritin: 13 ng/mL (ref 11–307)

## 2021-09-21 MED ORDER — PANTOPRAZOLE SODIUM 40 MG IV SOLR
40.0000 mg | Freq: Two times a day (BID) | INTRAVENOUS | Status: DC
  Administered 2021-09-22 – 2021-09-25 (×5): 40 mg via INTRAVENOUS
  Filled 2021-09-21 (×5): qty 10

## 2021-09-21 MED ORDER — SODIUM CHLORIDE 0.9 % IV SOLN
10.0000 mL/h | Freq: Once | INTRAVENOUS | Status: AC
  Administered 2021-09-21: 10 mL/h via INTRAVENOUS

## 2021-09-21 MED ORDER — INSULIN ASPART 100 UNIT/ML IJ SOLN
0.0000 [IU] | INTRAMUSCULAR | Status: DC
  Administered 2021-09-24: 1 [IU] via SUBCUTANEOUS

## 2021-09-21 MED ORDER — ACETAMINOPHEN 650 MG RE SUPP
650.0000 mg | Freq: Four times a day (QID) | RECTAL | Status: DC | PRN
Start: 2021-09-21 — End: 2021-09-25

## 2021-09-21 MED ORDER — PANTOPRAZOLE 80MG IVPB - SIMPLE MED
80.0000 mg | Freq: Once | INTRAVENOUS | Status: AC
  Administered 2021-09-21: 80 mg via INTRAVENOUS
  Filled 2021-09-21: qty 80

## 2021-09-21 MED ORDER — ACETAMINOPHEN 325 MG PO TABS
650.0000 mg | ORAL_TABLET | Freq: Four times a day (QID) | ORAL | Status: DC | PRN
  Administered 2021-09-22 – 2021-09-25 (×2): 650 mg via ORAL
  Filled 2021-09-21 (×2): qty 2

## 2021-09-21 NOTE — ED Provider Notes (Signed)
? ?Emergency Department Provider Note ? ? ?I have reviewed the triage vital signs and the nursing notes. ? ? ?HISTORY ? ?Chief Complaint ?Anemia ? ? ?HPI ?[name] is a 71 y.o. female with past medical history reviewed below including prior GI bleeding and anemia requiring blood transfusion presents with generalized weakness and low hemoglobin found by her PCP.  Patient states she is been feeling fatigued along with exertional shortness of breath over the past 3 weeks.  No fevers or chest pain.  No abdominal pain.  She states over the past several days her stools have been black. No vomiting blood or CP. Patient is not anticoagulated.  ? ? ?Past Medical History:  ?Diagnosis Date  ? Anemia 12/26/2015  ? Anxiety   ? Arthritis   ? Blood transfusion without reported diagnosis   ? Chronic pain   ? resolved per pt 04/12/20  ? COPD (chronic obstructive pulmonary disease) ([place])   ? Coronary artery disease   ? Depression   ? Diabetes mellitus without complication ([place])   ? type 2  ? GERD (gastroesophageal reflux disease)   ? Heart murmur   ? since birth; Echo 02/18/19: LVEF 84%, grade 1 diastolic dysfunction, mild AS (mean grad 12 mmHg), trace MR/PR, mild TR, PASP 32 mmHg    ? Hyperlipemia   ? Hypertension   ? PVD (peripheral vascular disease) ([place])   ? right SFA stent 02/11/17 by Dr. Einar Gip  ? Sleep apnea   ? does not use cpap  ? Wears glasses   ? ? ?Review of Systems ? ?Constitutional: No fever/chills. Positive generalized weakness.  ?Eyes: No visual changes. ?ENT: No sore throat. ?Cardiovascular: Denies chest pain. ?Respiratory: Positive exertional shortness of breath. ?Gastrointestinal: No abdominal pain.  No nausea, no vomiting.  No diarrhea.  No constipation. ?Genitourinary: Negative for dysuria. ?Musculoskeletal: Negative for back pain. ?Skin: Negative for rash. ?Neurological: Negative for headaches, focal weakness or numbness. ? ? ?____________________________________________ ? ? ?PHYSICAL EXAM: ? ?VITAL  SIGNS: ?ED Triage Vitals  ?Enc Vitals Group  ?   BP 09/21/21 1449 (!) 106/50  ?   Pulse Rate 09/21/21 1449 (!) 109  ?   Resp 09/21/21 1449 20  ?   Temp 09/21/21 1449 (!) 97.5 ?F (36.4 ?C)  ?   Temp Source 09/21/21 1607 Oral  ?   SpO2 09/21/21 1449 100 %  ? ?Constitutional: Alert and oriented. Well appearing and in no acute distress. ?Eyes: Conjunctivae are normal.  ?Head: Atraumatic. ?Nose: No congestion/rhinnorhea. ?Mouth/Throat: Mucous membranes are moist. ?Neck: No stridor.   ?Cardiovascular: Mild tachycardia. Good peripheral circulation. Grossly normal heart sounds.   ?Respiratory: Normal respiratory effort.  No retractions. Lungs CTAB. ?Gastrointestinal: Soft and nontender. No distention.  Rectal exam performed with patient's verbal consent and nurse chaperone.  There is small volume melena. No BRB.  ?Musculoskeletal:  No gross deformities of extremities. ?Neurologic:  Normal speech and language.  ?Skin:  Skin is warm, dry and intact. No rash noted. ? ? ?____________________________________________ ?  ?LABS ?(all labs ordered are listed, but only abnormal results are displayed) ? ?Labs Reviewed  ?RESP PANEL BY RT-PCR (FLU A&B, COVID) ARPGX2 - Abnormal; Notable for the following components:  ?    Result Value  ? SARS Coronavirus 2 by RT PCR POSITIVE (*)   ? All other components within normal limits  ?COMPREHENSIVE METABOLIC PANEL - Abnormal; Notable for the following components:  ? Glucose, Bld 124 (*)   ? Calcium 8.8 (*)   ?  Total Protein 6.2 (*)   ? Albumin 2.8 (*)   ? AST 13 (*)   ? All other components within normal limits  ?CBC - Abnormal; Notable for the following components:  ? RBC 2.41 (*)   ? Hemoglobin 5.3 (*)   ? HCT 20.3 (*)   ? MCH 22.0 (*)   ? MCHC 26.1 (*)   ? RDW 25.8 (*)   ? Platelets 795 (*)   ? All other components within normal limits  ?IRON AND TIBC - Abnormal; Notable for the following components:  ? Saturation Ratios 8 (*)   ? All other components within normal limits  ?RETICULOCYTES -  Abnormal; Notable for the following components:  ? Retic Ct Pct 5.6 (*)   ? RBC. 2.41 (*)   ? Immature Retic Fract 42.8 (*)   ? All other components within normal limits  ?CBC - Abnormal; Notable for the following components:  ? RBC 3.05 (*)   ? Hemoglobin 7.1 (*)   ? HCT 24.7 (*)   ? MCH 23.3 (*)   ? MCHC 28.7 (*)   ? RDW 21.9 (*)   ? Platelets 617 (*)   ? nRBC 0.5 (*)   ? All other components within normal limits  ?GLUCOSE, CAPILLARY - Abnormal; Notable for the following components:  ? Glucose-Capillary 136 (*)   ? All other components within normal limits  ?CBC - Abnormal; Notable for the following components:  ? RBC 3.36 (*)   ? Hemoglobin 7.9 (*)   ? HCT 27.4 (*)   ? MCH 23.5 (*)   ? MCHC 28.8 (*)   ? RDW 22.1 (*)   ? Platelets 747 (*)   ? nRBC 0.4 (*)   ? All other components within normal limits  ?CBC - Abnormal; Notable for the following components:  ? RBC 3.18 (*)   ? Hemoglobin 7.5 (*)   ? HCT 26.4 (*)   ? MCH 23.6 (*)   ? MCHC 28.4 (*)   ? RDW 21.9 (*)   ? Platelets 735 (*)   ? nRBC 0.6 (*)   ? All other components within normal limits  ?CBC - Abnormal; Notable for the following components:  ? RBC 3.35 (*)   ? Hemoglobin 8.0 (*)   ? HCT 27.3 (*)   ? MCH 23.9 (*)   ? MCHC 29.3 (*)   ? RDW 21.9 (*)   ? Platelets 762 (*)   ? nRBC 0.4 (*)   ? All other components within normal limits  ?COMPREHENSIVE METABOLIC PANEL - Abnormal; Notable for the following components:  ? Glucose, Bld 114 (*)   ? Calcium 8.6 (*)   ? Total Protein 5.9 (*)   ? Albumin 2.7 (*)   ? Total Bilirubin <0.1 (*)   ? Anion gap 4 (*)   ? All other components within normal limits  ?CBC WITH DIFFERENTIAL/PLATELET - Abnormal; Notable for the following components:  ? RBC 3.24 (*)   ? Hemoglobin 7.6 (*)   ? HCT 26.8 (*)   ? MCH 23.5 (*)   ? MCHC 28.4 (*)   ? RDW 22.2 (*)   ? Platelets 744 (*)   ? nRBC 0.4 (*)   ? nRBC 1 (*)   ? Abs Immature Granulocytes 0.10 (*)   ? All other components within normal limits  ?GLUCOSE, CAPILLARY - Abnormal; Notable  for the following components:  ? Glucose-Capillary 109 (*)   ? All other components within normal limits  ?  GLUCOSE, CAPILLARY - Abnormal; Notable for the following components:  ? Glucose-Capillary 116 (*)   ? All other components within normal limits  ?GLUCOSE, CAPILLARY - Abnormal; Notable for the following components:  ? Glucose-Capillary 105 (*)   ? All other components within normal limits  ?GLUCOSE, CAPILLARY - Abnormal; Notable for the following components:  ? Glucose-Capillary 102 (*)   ? All other components within normal limits  ?CBC - Abnormal; Notable for the following components:  ? RBC 3.27 (*)   ? Hemoglobin 7.7 (*)   ? HCT 27.4 (*)   ? MCH 23.5 (*)   ? MCHC 28.1 (*)   ? RDW 22.2 (*)   ? Platelets 781 (*)   ? All other components within normal limits  ?GLUCOSE, CAPILLARY - Abnormal; Notable for the following components:  ? Glucose-Capillary 110 (*)   ? All other components within normal limits  ?MAGNESIUM - Abnormal; Notable for the following components:  ? Magnesium 1.6 (*)   ? All other components within normal limits  ?COMPREHENSIVE METABOLIC PANEL - Abnormal; Notable for the following components:  ? Glucose, Bld 111 (*)   ? Calcium 8.7 (*)   ? Total Protein 6.0 (*)   ? Albumin 2.6 (*)   ? AST 13 (*)   ? Total Bilirubin 0.2 (*)   ? All other components within normal limits  ?CBC WITH DIFFERENTIAL/PLATELET - Abnormal; Notable for the following components:  ? RBC 3.21 (*)   ? Hemoglobin 7.8 (*)   ? HCT 26.8 (*)   ? MCH 24.3 (*)   ? MCHC 29.1 (*)   ? RDW 22.4 (*)   ? Platelets 751 (*)   ? Neutro Abs 7.9 (*)   ? All other components within normal limits  ?GLUCOSE, CAPILLARY - Abnormal; Notable for the following components:  ? Glucose-Capillary 109 (*)   ? All other components within normal limits  ?GLUCOSE, CAPILLARY - Abnormal; Notable for the following components:  ? Glucose-Capillary 120 (*)   ? All other components within normal limits  ?GLUCOSE, CAPILLARY - Abnormal; Notable for the following  components:  ? Glucose-Capillary 111 (*)   ? All other components within normal limits  ?COMPREHENSIVE METABOLIC PANEL - Abnormal; Notable for the following components:  ? Glucose, Bld 110 (*)   ? Calcium 8.4

## 2021-09-21 NOTE — Assessment & Plan Note (Signed)
Likely due to anemia.  High-sensitivity troponin negative. ?-Cardiac monitoring ?-Stat EKG ?-Check second set of troponin ?

## 2021-09-21 NOTE — Assessment & Plan Note (Signed)
-  Hold aspirin ?-Resume statin after pharmacy med rec is done ?

## 2021-09-21 NOTE — Assessment & Plan Note (Signed)
-  Check A1c ?-Sliding scale insulin sensitive every 4 hours ?

## 2021-09-21 NOTE — Assessment & Plan Note (Signed)
Symptomatic acute blood loss anemia ?Patient is presenting with a 2-week history of melena.  She takes aspirin 81 mg daily.  Denies NSAID or alcohol use.  No hematemesis.  On rectal exam done in the ED, noted to have scant melena.  FOBT positive.  She is hemodynamically stable.  Hemoglobin 5.3, was 8.1 on labs done 3 weeks ago. EGD done in November 2022 revealed angiodysplastic lesions in the duodenum and jejunum which were treated.  [facility] consulted and recommended Protonix twice daily and keeping n.p.o. after midnight for likely EGD tomorrow. ?-Patient was given IV Protonix 80 mg in the ED.  Continue IV Protonix 40 mg every 12 hours. ?-N.p.o. after midnight ?-Hold home aspirin ?-2 units PRBCs ordered in the ED ?-Posttransfusion H&H ?-Transfuse PRBCs if hemoglobin less than 7 ?

## 2021-09-21 NOTE — ED Notes (Signed)
Called report to [employer] on 5W ?

## 2021-09-21 NOTE — Assessment & Plan Note (Addendum)
Possibly due to history of cigarette smoking.  Patient reports past history of heavy cigarette smoking, quit now. Platelet count 795k, chronically elevated and was 522k on labs done 3 weeks ago.  ?-Continue to monitor ?-Outpatient follow-up with PCP, may benefit from hematology referral ?

## 2021-09-21 NOTE — Assessment & Plan Note (Signed)
Stable. ?-Hold home antihypertensives at this time in the setting of acute GI bleed. ?

## 2021-09-21 NOTE — Assessment & Plan Note (Signed)
Chronic hypoxic respiratory failure ?Stable, not wheezing.  Satting well on 2 L home oxygen. ?-Resume home inhalers after pharmacy med rec is done ?-Continue supplemental oxygen ?

## 2021-09-21 NOTE — ED Provider Triage Note (Signed)
Emergency Medicine Provider Triage Evaluation Note ? ?[name] , a 71 y.o. female  was evaluated in triage.  Pt complains of generalized weakness, lightheadedness and dizziness.  Patient reports that she was recently discharged from the facility on 3/2 for COPD exacerbation.  Patient reports that since this time she has had the symptoms described above.  Patient reports she was seen at PCP recently, had labs drawn and was noted to be anemic with low hemoglobin.  Patient sent here for evaluation.  Patient also reports that she was recently diagnosed with pneumonia and placed on Robitussin.  Question the history here.  Patient also reports that she has been short of breath and having chest pain with exertion that radiates into her left arm since this time. ? ?Review of Systems  ?Positive: Chest pain, shortness of breath, lightheadedness, dizziness, weakness ?Negative: Fevers, nausea, vomiting, diarrhea ? ?Physical Exam  ?BP (!) 106/50 (BP Location: Left Arm)   Pulse (!) 109   Temp (!) 97.5 ?F (36.4 ?C)   Resp 20   LMP  (LMP Unknown)   SpO2 100%  ?Gen:   Awake, no distress   ?Resp:  Normal effort  ?MSK:   Moves extremities without difficulty  ?Other:  Lungs clear ? ?Medical Decision Making  ?Medically screening exam initiated at 3:02 PM.  Appropriate orders placed.  [name] was informed that the remainder of the evaluation will be completed by another provider, this initial triage assessment does not replace that evaluation, and the importance of remaining in the ED until their evaluation is complete. ? ? ?  ?Azucena Cecil, PA-C ?09/21/21 1503 ? ?

## 2021-09-21 NOTE — H&P (Signed)
?History and Physical  ? ? ?[name] OZD:[id number] DOB: 1951/05/17 DOA: 09/21/2021 ? ?PCP: Benito Mccreedy, MD ? ?Patient coming from: Home ? ?Chief Complaint: Dark stools ? ?HPI: [name] is a 71 y.o. female with medical history significant of chronic anemia, chronic thrombocytosis, anxiety, depression, arthritis, COPD, chronic hypoxic respiratory failure on 2 L home oxygen, CAD, non-insulin-dependent type 2 diabetes, GERD, hypertension, hyperlipidemia, PVD status post PV angiogram with revascularization of the SFA, sleep apnea, obesity, history of prior GI bleed presents to the ED for evaluation of dark stools, generalized weakness, and low hemoglobin found by her PCP.  Scant melena on exam and FOBT positive.  Hemodynamically stable.  Hemoglobin 5.3, was 8.1 on labs done 3 weeks ago.  Platelet count 795k, chronically elevated and was 522k on labs done 3 weeks ago.  High-sensitivity troponin negative.  Chest x-ray showing stable mild cardiomegaly and right midlung atelectasis. ED physician discussed the case with Dr. Rush Landmark with [facility], recommended Protonix twice daily and keeping n.p.o. after midnight for likely EGD tomorrow.  Patient was given IV Protonix 80 mg and 2 units PRBCs ordered in the ED. ? ?Patient reports 2-week history of dark stools.  Denies abdominal pain and has not vomited any blood.  She takes a baby aspirin daily but no other blood thinners.  Denies NSAID or alcohol use.  Reports fatigue, lightheadedness, dyspnea on exertion, and intermittent episodes of substernal chest pain for the past 1.5 weeks.  No other complaints. ? ?Review of Systems:  ?Review of Systems  ?All other systems reviewed and are negative. ? ?Past Medical History:  ?Diagnosis Date  ? Anemia 12/26/2015  ? Anxiety   ? Arthritis   ? Blood transfusion without reported diagnosis   ? Chronic pain   ? resolved per pt 04/12/20  ? COPD (chronic obstructive pulmonary disease) ([place])   ? Coronary artery  disease   ? Depression   ? Diabetes mellitus without complication ([place])   ? type 2  ? GERD (gastroesophageal reflux disease)   ? Heart murmur   ? since birth; Echo 02/18/19: LVEF 25%, grade 1 diastolic dysfunction, mild AS (mean grad 12 mmHg), trace MR/PR, mild TR, PASP 32 mmHg    ? Hyperlipemia   ? Hypertension   ? PVD (peripheral vascular disease) ([place])   ? right SFA stent 02/11/17 by Dr. Einar Gip  ? Sleep apnea   ? does not use cpap  ? Wears glasses   ? ? ?Past Surgical History:  ?Procedure Laterality Date  ? ABDOMINAL HYSTERECTOMY    ? CARDIAC CATHETERIZATION    ? CATARACT EXTRACTION    ? CESAREAN SECTION    ? COLONOSCOPY N/A 01/08/2013  ? Procedure: COLONOSCOPY;  Surgeon: Beryle Beams, MD;  Location: [facility] ENDOSCOPY;  Service: Endoscopy;  Laterality: N/A;  ? COLONOSCOPY N/A 12/28/2015  ? Procedure: COLONOSCOPY;  Surgeon: Carol Ada, MD;  Location: [facility] ENDOSCOPY;  Service: Endoscopy;  Laterality: N/A;  ? COLONOSCOPY WITH PROPOFOL N/A 10/05/2020  ? Procedure: COLONOSCOPY WITH PROPOFOL;  Surgeon: Carol Ada, MD;  Location: [facility] ENDOSCOPY;  Service: Endoscopy;  Laterality: N/A;  ? ENTEROSCOPY N/A 12/28/2015  ? Procedure: ENTEROSCOPY;  Surgeon: Carol Ada, MD;  Location: [facility];  Service: Endoscopy;  Laterality: N/A;  ? ENTEROSCOPY N/A 02/20/2018  ? Procedure: ENTEROSCOPY;  Surgeon: Carol Ada, MD;  Location: [facility]OS[facility];  Service: Endoscopy;  Laterality: N/A;  ? ENTEROSCOPY N/A 03/27/2018  ? Procedure: ENTEROSCOPY;  Surgeon: Carol Ada, MD;  Location: [facility]OS[facility];  Service: Endoscopy;  Laterality: N/A;  ? ENTEROSCOPY N/A 10/05/2020  ? Procedure: ENTEROSCOPY;  Surgeon: Carol Ada, MD;  Location: [facility]OS[facility];  Service: Endoscopy;  Laterality: N/A;  ? ENTEROSCOPY N/A 05/11/2021  ? Procedure: ENTEROSCOPY;  Surgeon: Carol Ada, MD;  Location: [facility]OS[facility];  Service: Endoscopy;  Laterality: N/A;  ? ESOPHAGOGASTRODUODENOSCOPY N/A 01/08/2013  ? Procedure: ESOPHAGOGASTRODUODENOSCOPY (EGD);  Surgeon: Beryle Beams,  MD;  Location: [facility] ENDOSCOPY;  Service: Endoscopy;  Laterality: N/A;  ? EYE SURGERY    ? HAND SURGERY    ? HEMOSTASIS CLIP PLACEMENT  05/11/2021  ? Procedure: HEMOSTASIS CLIP PLACEMENT;  Surgeon: Carol Ada, MD;  Location: [facility] ENDOSCOPY;  Service: Endoscopy;;  ? HOT HEMOSTASIS N/A 12/28/2015  ? Procedure: HOT HEMOSTASIS (ARGON PLASMA COAGULATION/BICAP);  Surgeon: Carol Ada, MD;  Location: [facility] ENDOSCOPY;  Service: Endoscopy;  Laterality: N/A;  ? HOT HEMOSTASIS N/A 02/20/2018  ? Procedure: HOT HEMOSTASIS (ARGON PLASMA COAGULATION/BICAP);  Surgeon: Carol Ada, MD;  Location: [facility] ENDOSCOPY;  Service: Endoscopy;  Laterality: N/A;  ? HOT HEMOSTASIS N/A 03/27/2018  ? Procedure: HOT HEMOSTASIS (ARGON PLASMA COAGULATION/BICAP);  Surgeon: Carol Ada, MD;  Location: [facility] ENDOSCOPY;  Service: Endoscopy;  Laterality: N/A;  ? HOT HEMOSTASIS N/A 10/05/2020  ? Procedure: HOT HEMOSTASIS (ARGON PLASMA COAGULATION/BICAP);  Surgeon: Carol Ada, MD;  Location: [facility] ENDOSCOPY;  Service: Endoscopy;  Laterality: N/A;  ? HOT HEMOSTASIS N/A 05/11/2021  ? Procedure: HOT HEMOSTASIS (ARGON PLASMA COAGULATION/BICAP);  Surgeon: Carol Ada, MD;  Location: [facility] ENDOSCOPY;  Service: Endoscopy;  Laterality: N/A;  ? LEFT HEART CATHETERIZATION WITH CORONARY ANGIOGRAM N/A 03/09/2013  ? Procedure: LEFT HEART CATHETERIZATION WITH CORONARY ANGIOGRAM;  Surgeon: Laverda Page, MD;  Location[facility] CATH LAB;  Service: Cardiovascular;  Laterality: N/A;  ? LOWER EXTREMITY ANGIOGRAM N/A 12/29/2012  ? Procedure: LOWER EXTREMITY ANGIOGRAM;  Surgeon: Laverda Page, MD;  Location: [facility] CATH LAB;  Service: Cardiovascular;  Laterality: N/A;  ? LOWER EXTREMITY ANGIOGRAM N/A 10/05/2013  ? Procedure: LOWER EXTREMITY ANGIOGRAM;  Surgeon: Laverda Page, MD;  Location: [facility] CATH LAB;  Service: Cardiovascular;  Laterality: N/A;  ? LOWER EXTREMITY ANGIOGRAPHY N/A 02/11/2017  ? Procedure: Lower Extremity Angiography;  Surgeon: Adrian Prows, MD;  Location: [place] CV LAB;   Service: Cardiovascular;  Laterality: N/A;  ? PERIPHERAL VASCULAR BALLOON ANGIOPLASTY  02/11/2017  ? Procedure: PERIPHERAL VASCULAR BALLOON ANGIOPLASTY;  Surgeon: Adrian Prows, MD;  Location: [place] CV LAB;  Service: Cardiovascular;;  Right SFA  ? PERIPHERAL VASCULAR INTERVENTION Right 02/11/2017  ? Procedure: PERIPHERAL VASCULAR INTERVENTION;  Surgeon: Adrian Prows, MD;  Location: [place] CV LAB;  Service: Cardiovascular;  Laterality: Right;  Rt SFA  ? POLYPECTOMY  10/05/2020  ? Procedure: POLYPECTOMY;  Surgeon: Carol Ada, MD;  Location: [facility] ENDOSCOPY;  Service: Endoscopy;;  ? stent in right leg  12/29/2012  ? for blood clot, Dr. Nadyne Coombes  ? TOTAL KNEE ARTHROPLASTY Left 04/18/2020  ? TOTAL KNEE ARTHROPLASTY Left 04/18/2020  ? Procedure: LEFT TOTAL KNEE ARTHROPLASTY;  Surgeon: Meredith Pel, MD;  Location: [facility];  Service: Orthopedics;  Laterality: Left;  ? TYMPANOMASTOIDECTOMY Left 03/08/2014  ? Procedure: TYMPANOMASTOIDECTOMY LEFT;  Surgeon: Ascencion Dike, MD;  Location: [facility];  Service: ENT;  Laterality: Left;  ? TYMPANOMASTOIDECTOMY  2015  ? UTERINE FIBROID SURGERY    ? ? ? reports that she has quit smoking. Her smoking use included cigarettes. She has a 20.50 pack-year smoking history. She has never used smokeless tobacco. She reports current alcohol use.  She reports current drug use. Drugs: Marijuana and Cocaine. ? ?No Known Allergies ? ?Family History  ?Problem Relation Age of Onset  ? Diabetes Mother   ? Hypertension Mother   ? Heart disease Mother   ? Heart disease Father   ? Hypertension Father   ? Hypertension Sister   ? Hypertension Brother   ? Diabetes Brother   ? Hypertension Sister   ? Hypertension Brother   ? Diabetes Brother   ? Hypertension Brother   ? Hypertension Brother   ? Hypertension Brother   ? ? ?Prior to Admission medications   ?Medication Sig Start Date End Date Taking? Authorizing Provider  ?acetaminophen (TYLENOL) 500 MG tablet Take 1,000 mg by mouth daily as  needed for moderate pain.    [provider]  ?albuterol (PROVENTIL HFA;VENTOLIN HFA) 108 (90 Base) MCG/ACT inhaler Inhale 1-2 puffs into the lungs every 6 (six) hours as needed for wheezing or

## 2021-09-21 NOTE — ED Triage Notes (Signed)
Patient who state she has been feeling unwell for the last month was called by PCP and told to go to ED for hemoglobin 5.9. Patient reports dizziness that is worse when bending over, reports exertional shortness of breath. Patient denies known source of bleeding. ?

## 2021-09-22 DIAGNOSIS — K922 Gastrointestinal hemorrhage, unspecified: Secondary | ICD-10-CM | POA: Diagnosis not present

## 2021-09-22 DIAGNOSIS — D509 Iron deficiency anemia, unspecified: Secondary | ICD-10-CM | POA: Diagnosis not present

## 2021-09-22 DIAGNOSIS — R933 Abnormal findings on diagnostic imaging of other parts of digestive tract: Secondary | ICD-10-CM

## 2021-09-22 DIAGNOSIS — K5520 Angiodysplasia of colon without hemorrhage: Secondary | ICD-10-CM

## 2021-09-22 DIAGNOSIS — D500 Iron deficiency anemia secondary to blood loss (chronic): Secondary | ICD-10-CM | POA: Diagnosis not present

## 2021-09-22 LAB — CBC
HCT: 24.7 % — ABNORMAL LOW (ref 36.0–46.0)
HCT: 26.4 % — ABNORMAL LOW (ref 36.0–46.0)
HCT: 27.3 % — ABNORMAL LOW (ref 36.0–46.0)
HCT: 27.4 % — ABNORMAL LOW (ref 36.0–46.0)
Hemoglobin: 7.1 g/dL — ABNORMAL LOW (ref 12.0–15.0)
Hemoglobin: 7.5 g/dL — ABNORMAL LOW (ref 12.0–15.0)
Hemoglobin: 8 g/dL — ABNORMAL LOW (ref 12.0–15.0)
Hemoglobin: 7.9 g/dL — ABNORMAL LOW (ref 12.0–15.0)
MCH: 23.3 pg — ABNORMAL LOW (ref 26.0–34.0)
MCH: 23.6 pg — ABNORMAL LOW (ref 26.0–34.0)
MCH: 23.9 pg — ABNORMAL LOW (ref 26.0–34.0)
MCH: 23.5 pg — ABNORMAL LOW (ref 26.0–34.0)
MCHC: 28.7 g/dL — ABNORMAL LOW (ref 30.0–36.0)
MCHC: 28.4 g/dL — ABNORMAL LOW (ref 30.0–36.0)
MCHC: 29.3 g/dL — ABNORMAL LOW (ref 30.0–36.0)
MCHC: 28.8 g/dL — ABNORMAL LOW (ref 30.0–36.0)
MCV: 81 fL (ref 80.0–100.0)
MCV: 83 fL (ref 80.0–100.0)
MCV: 81.5 fL (ref 80.0–100.0)
MCV: 81.5 fL (ref 80.0–100.0)
Platelets: 617 10*3/uL — ABNORMAL HIGH (ref 150–400)
Platelets: 735 10*3/uL — ABNORMAL HIGH (ref 150–400)
Platelets: 762 10*3/uL — ABNORMAL HIGH (ref 150–400)
Platelets: 747 10*3/uL — ABNORMAL HIGH (ref 150–400)
RBC: 3.05 MIL/uL — ABNORMAL LOW (ref 3.87–5.11)
RBC: 3.18 MIL/uL — ABNORMAL LOW (ref 3.87–5.11)
RBC: 3.35 MIL/uL — ABNORMAL LOW (ref 3.87–5.11)
RBC: 3.36 MIL/uL — ABNORMAL LOW (ref 3.87–5.11)
RDW: 21.9 % — ABNORMAL HIGH (ref 11.5–15.5)
RDW: 21.9 % — ABNORMAL HIGH (ref 11.5–15.5)
RDW: 21.9 % — ABNORMAL HIGH (ref 11.5–15.5)
RDW: 22.1 % — ABNORMAL HIGH (ref 11.5–15.5)
WBC: 9.3 10*3/uL (ref 4.0–10.5)
WBC: 9.7 10*3/uL (ref 4.0–10.5)
WBC: 9.2 10*3/uL (ref 4.0–10.5)
WBC: 8.9 10*3/uL (ref 4.0–10.5)
nRBC: 0.5 % — ABNORMAL HIGH (ref 0.0–0.2)
nRBC: 0.6 % — ABNORMAL HIGH (ref 0.0–0.2)
nRBC: 0.4 % — ABNORMAL HIGH (ref 0.0–0.2)
nRBC: 0.4 % — ABNORMAL HIGH (ref 0.0–0.2)

## 2021-09-22 LAB — GLUCOSE, CAPILLARY
Glucose-Capillary: 109 mg/dL — ABNORMAL HIGH (ref 70–99)
Glucose-Capillary: 136 mg/dL — ABNORMAL HIGH (ref 70–99)
Glucose-Capillary: 93 mg/dL (ref 70–99)
Glucose-Capillary: 94 mg/dL (ref 70–99)
Glucose-Capillary: 98 mg/dL (ref 70–99)
Glucose-Capillary: 99 mg/dL (ref 70–99)

## 2021-09-22 LAB — TYPE AND SCREEN
ABO/RH(D): O POS
Antibody Screen: NEGATIVE
Unit division: 0
Unit division: 0

## 2021-09-22 LAB — RESP PANEL BY RT-PCR (FLU A&B, COVID) ARPGX2
Influenza A by PCR: NEGATIVE
Influenza B by PCR: NEGATIVE
SARS Coronavirus 2 by RT PCR: POSITIVE — AB

## 2021-09-22 LAB — BPAM RBC
Blood Product Expiration Date: 202304272359
Blood Product Expiration Date: 202304272359
ISSUE DATE / TIME: 202303311939
ISSUE DATE / TIME: 202303312336
Unit Type and Rh: 5100
Unit Type and Rh: 5100

## 2021-09-22 LAB — TROPONIN I (HIGH SENSITIVITY): Troponin I (High Sensitivity): 10 ng/L (ref <18)

## 2021-09-22 MED ORDER — FERROUS SULFATE 325 (65 FE) MG PO TABS
325.0000 mg | ORAL_TABLET | Freq: Every day | ORAL | Status: DC
  Administered 2021-09-25: 325 mg via ORAL
  Filled 2021-09-22 (×2): qty 1

## 2021-09-22 MED ORDER — ALUM & MAG HYDROXIDE-SIMETH 200-200-20 MG/5ML PO SUSP
60.0000 mL | Freq: Once | ORAL | Status: AC
  Administered 2021-09-22: 60 mL via ORAL
  Filled 2021-09-22: qty 60

## 2021-09-22 MED ORDER — LISINOPRIL 5 MG PO TABS
5.0000 mg | ORAL_TABLET | Freq: Every day | ORAL | Status: DC
  Administered 2021-09-25: 5 mg via ORAL
  Filled 2021-09-22: qty 1

## 2021-09-22 MED ORDER — ALBUTEROL SULFATE HFA 108 (90 BASE) MCG/ACT IN AERS: 1.0000 | INHALATION_SPRAY | Freq: Four times a day (QID) | RESPIRATORY_TRACT | Status: DC | PRN

## 2021-09-22 MED ORDER — ALBUTEROL SULFATE (2.5 MG/3ML) 0.083% IN NEBU: 2.5000 mg | INHALATION_SOLUTION | Freq: Four times a day (QID) | RESPIRATORY_TRACT | Status: DC | PRN

## 2021-09-22 MED ORDER — IPRATROPIUM-ALBUTEROL 0.5-2.5 (3) MG/3ML IN SOLN
3.0000 mL | Freq: Three times a day (TID) | RESPIRATORY_TRACT | Status: DC
  Administered 2021-09-22 – 2021-09-24 (×7): 3 mL via RESPIRATORY_TRACT
  Filled 2021-09-22 (×6): qty 3

## 2021-09-22 MED ORDER — METOPROLOL TARTRATE 5 MG/5ML IV SOLN: 5.0000 mg | INTRAVENOUS | Status: DC | PRN

## 2021-09-22 MED ORDER — SPIRONOLACTONE 25 MG PO TABS
50.0000 mg | ORAL_TABLET | Freq: Every day | ORAL | Status: DC
  Administered 2021-09-25: 50 mg via ORAL
  Filled 2021-09-22: qty 2

## 2021-09-22 MED ORDER — METOPROLOL TARTRATE 50 MG PO TABS
50.0000 mg | ORAL_TABLET | Freq: Two times a day (BID) | ORAL | Status: DC
Start: 2021-09-22 — End: 2021-09-25
  Administered 2021-09-22 – 2021-09-25 (×6): 50 mg via ORAL
  Filled 2021-09-22 (×6): qty 1

## 2021-09-22 MED ORDER — HYDRALAZINE HCL 50 MG PO TABS
50.0000 mg | ORAL_TABLET | Freq: Three times a day (TID) | ORAL | Status: DC
  Filled 2021-09-22: qty 1

## 2021-09-22 MED ORDER — EZETIMIBE 10 MG PO TABS
10.0000 mg | ORAL_TABLET | Freq: Every day | ORAL | Status: DC
  Administered 2021-09-25: 10 mg via ORAL
  Filled 2021-09-22: qty 1

## 2021-09-22 MED ORDER — ATORVASTATIN CALCIUM 80 MG PO TABS
80.0000 mg | ORAL_TABLET | Freq: Every day | ORAL | Status: DC
  Administered 2021-09-25: 80 mg via ORAL
  Filled 2021-09-22: qty 1

## 2021-09-22 MED ORDER — METOPROLOL TARTRATE 25 MG PO TABS: 25.0000 mg | ORAL_TABLET | Freq: Two times a day (BID) | ORAL | Status: DC

## 2021-09-22 NOTE — H&P (View-Only) (Signed)
? ? ? ? ? Consultation ? ?Referring Provider:   Dr. Candiss Norse ?Primary Care Physician:  Benito Mccreedy, MD ?Primary Gastroenterologist: Dr. Benson Norway       ?Reason for Consultation:     Anemia ? ? Impression   ? ?Melena without hemodynamic compromise ?patient with longstanding history of multiple angiodysplastic lesions in duodenum and jejunum.  Last SVE 05/11/2021 with Dr. Benson Norway. ?10/05/2020 colonoscopy with Dr. Benson Norway shows diverticulosis, polyps ranging 2 to 7 mm in size 7 total.  Recall 3 years  ?HGB 7.1 MCV 81.0 Platelets 617 ?BUN 10 Cr 0.82 ?Patient did not have elevation of BUN on admission. ?Patient status post 2 units packed red blood cells 09/21/2021 and last4/01/23.  Completed at 1:00  ? ?Normocytic anemia, acute on chronic in setting of melena. ?CBC on 09/22/2021   ?WBC 9.3 HGB 7.1 MCV 81.0 Platelets 617 ?Anemia studies on 09/21/2021  ?Iron 33 Ferritin 13 B12 481 ? ?COPD with chronic hypoxemia ?Chronically on 2 L oxygen at home. ? ?Coronary artery disease ?Having chest pain, negative troponins and EKG. ?Echo 08/2021 ejection fraction 64-33, grade 2 diastolic dysfunction, moderately elevated pulmonary artery systolic pressure, mild AS ? ?Type 2 diabetes ? ?Peripheral vascular disease ?tatus post right SFA stent 2018 ? ? Plan  ? ?-Pending today's CBC ?-Protonix 40 mg IV BID ?-Clear liquid diet, NPO at midnight. ?--Continue to monitor H&H with transfusion as needed to maintain hemoglobin greater than 8 given cardiac history. ?-Plan for small bowel enteroscopy today if anesthesia allows, if negative consider colonoscopy with capsule endoscopy.Marland Kitchen  ?I thoroughly discussed the procedure to include nature, alternatives, benefits, and risks including but not limited to bleeding, perforation, infection, anesthesia/cardiac and pulmonary complications.  ?With patient's multiple comorbidities, she understands she is high risk for procedures. ?Patient provides understanding and gave verbal consent to proceed. ?-Would give 10 mg  IV Reglan 30 minutes prior to endoscopy ? ? ?Thank you for your kind consultation, we will continue to follow. ?       ? HPI:   ?[name] is a 71 y.o. female with past medical history significant for hypertension, hyperlipidemia, sleep apnea without CPAP use, peripheral arterial disease status post right SFA stent 2018, type 2 diabetes, coronary artery disease (Echo 08/2021 ejection fraction 29-51, grade 2 diastolic dysfunction, moderately elevated pulmonary artery systolic pressure, mild AS), COPD with chronic hypoxic respiratory failure on 2 L home oxygen, GERD, history of multiple small bowel enteroscopy's in the past for bleeding AVMs in duodenum and jejunum requiring blood transfusions. ?On outpatient Protonix 40 mg daily. ?Last hospitalization 04/2021 for anemia- SVE 05/11/2021 with Dr. Almyra Free a few bleeding angiodysplastic lesions duodenum status post MR clips, nonbleeding angiodysplastic lesions jejunum treated with monopolar probe. ? ?Patient presented to the ER 09/21/2021 with weakness and melena for 1 week.   ?Had scant melena on exam and FOBT positive.  Hemoglobin found to be 5.3 was 8.13 weeks ago. ?Thrombocytosis platelet count 795. ?Negative troponin, chest x-ray cardiomegaly stable. ?Patient given IV Protonix 80 mg and 2 units packed red blood cells. ? ?Patient states has been having melena for the past week, some loose stools, could be small to large volume.  Denies hematochezia.  Denies abdominal pain, nausea, vomiting. ?Last bowel movement was yesterday morning small volume. ?Patient was having dyspnea with exertion, substernal chest pain. ?Shortness of breath has improved some, she continues on home requirement of oxygen 2 L nasal cannula. ?Patient has had some chest discomfort this morning, given Mylanta this morning with some  improvement.  Has not had troponin negative. ?Patient does complain of worsening bilateral leg edema which is started in the last 6 months ?She denies NSAID or  EtOH use. ? ? ?Past Medical History:  ?Diagnosis Date  ? Anemia 12/26/2015  ? Anxiety   ? Arthritis   ? Blood transfusion without reported diagnosis   ? Chronic pain   ? resolved per pt 04/12/20  ? COPD (chronic obstructive pulmonary disease) ([place])   ? Coronary artery disease   ? Depression   ? Diabetes mellitus without complication ([place])   ? type 2  ? GERD (gastroesophageal reflux disease)   ? Heart murmur   ? since birth; Echo 02/18/19: LVEF 62%, grade 1 diastolic dysfunction, mild AS (mean grad 12 mmHg), trace MR/PR, mild TR, PASP 32 mmHg    ? Hyperlipemia   ? Hypertension   ? PVD (peripheral vascular disease) ([place])   ? right SFA stent 02/11/17 by Dr. Einar Gip  ? Sleep apnea   ? does not use cpap  ? Wears glasses   ? ? ?Surgical History:  ?She  has a past surgical history that includes Cesarean section; Uterine fibroid surgery; Hand surgery; Abdominal hysterectomy; stent in right leg (12/29/2012); Esophagogastroduodenoscopy (N/A, 01/08/2013); Colonoscopy (N/A, 01/08/2013); tympanomastoidectomy (Left, 03/08/2014); lower extremity angiogram (N/A, 12/29/2012); left heart catheterization with coronary angiogram (N/A, 03/09/2013); lower extremity angiogram (N/A, 10/05/2013); tympanomastoidectomy (2015); enteroscopy (N/A, 12/28/2015); Colonoscopy (N/A, 12/28/2015); Hot hemostasis (N/A, 12/28/2015); Lower Extremity Angiography (N/A, 02/11/2017); PERIPHERAL VASCULAR BALLOON ANGIOPLASTY (02/11/2017); PERIPHERAL VASCULAR INTERVENTION (Right, 02/11/2017); enteroscopy (N/A, 02/20/2018); Hot hemostasis (N/A, 02/20/2018); Cataract extraction; Eye surgery; enteroscopy (N/A, 03/27/2018); Hot hemostasis (N/A, 03/27/2018); Cardiac catheterization; Total knee arthroplasty (Left, 04/18/2020); Total knee arthroplasty (Left, 04/18/2020); enteroscopy (N/A, 10/05/2020); Colonoscopy with propofol (N/A, 10/05/2020); Hot hemostasis (N/A, 10/05/2020); polypectomy (10/05/2020); enteroscopy (N/A, 05/11/2021); Hot hemostasis (N/A, 05/11/2021); and Hemostasis clip placement  (05/11/2021). ?Family History:  ?Her family history includes Diabetes in her brother, brother, and mother; Heart disease in her father and mother; Hypertension in her brother, brother, brother, brother, brother, father, mother, sister, and sister. ?Social History:  ? reports that she has quit smoking. Her smoking use included cigarettes. She has a 20.50 pack-year smoking history. She has never used smokeless tobacco. She reports current alcohol use. She reports current drug use. Drugs: Marijuana and Cocaine. ? ?Prior to Admission medications   ?Medication Sig Start Date End Date Taking? Authorizing Provider  ?acetaminophen (TYLENOL) 500 MG tablet Take 1,000 mg by mouth daily as needed for moderate pain.   Yes [provider]  ?albuterol (PROVENTIL HFA;VENTOLIN HFA) 108 (90 Base) MCG/ACT inhaler Inhale 1-2 puffs into the lungs every 6 (six) hours as needed for wheezing or shortness of breath.   Yes [provider]  ?ASPIRIN LOW DOSE 81 MG EC tablet TAKE 1 TABLET(81 MG) BY MOUTH DAILY ?Patient taking differently: Take 81 mg by mouth daily. 06/30/20  Yes Magnant, Charles L, PA-C  ?atorvastatin (LIPITOR) 80 MG tablet Take 80 mg by mouth daily. 08/08/20  Yes [provider]  ?cilostazol (PLETAL) 100 MG tablet Take 1 tablet (100 mg total) by mouth 2 (two) times daily. 09/10/21 03/09/22 Yes [name], [name], DO  ?doxycycline (VIBRAMYCIN) 100 MG capsule Take 100 mg by mouth See admin instructions. Bid x 10 days 09/19/21  Yes [provider]  ?ezetimibe (ZETIA) 10 MG tablet Take 1 tablet (10 mg total) by mouth daily. 08/14/21 11/12/21 Yes [name], [name], DO  ?ferrous sulfate 325 (65 FE) MG tablet Take 325 mg by mouth  daily.   Yes [provider]  ?fluticasone (FLONASE) 50 MCG/ACT nasal spray Place 1 spray into both nostrils daily as needed for allergies. 08/14/21  Yes [provider]  ?gabapentin (NEURONTIN) 600 MG tablet Take 1 tablet (600 mg total) by mouth at bedtime. ?Patient taking  differently: Take 600 mg by mouth 2 (two) times daily. 10/02/17  Yes Hyatt, Max T, DPM  ?ipratropium-albuterol (DUONEB) 0.5-2.5 (3) MG/3ML SOLN Take 3 mLs by nebulization 3 (three) times daily. 11/19/

## 2021-09-22 NOTE — Consult Note (Signed)
? ? ? ? ? Consultation ? ?Referring Provider:   Dr. Candiss Norse ?Primary Care Physician:  Benito Mccreedy, MD ?Primary Gastroenterologist: Dr. Benson Norway       ?Reason for Consultation:     Anemia ? ? Impression   ? ?Melena without hemodynamic compromise ?patient with longstanding history of multiple angiodysplastic lesions in duodenum and jejunum.  Last SVE 05/11/2021 with Dr. Benson Norway. ?10/05/2020 colonoscopy with Dr. Benson Norway shows diverticulosis, polyps ranging 2 to 7 mm in size 7 total.  Recall 3 years  ?HGB 7.1 MCV 81.0 Platelets 617 ?BUN 10 Cr 0.82 ?Patient did not have elevation of BUN on admission. ?Patient status post 2 units packed red blood cells 09/21/2021 and last4/01/23.  Completed at 1:00  ? ?Normocytic anemia, acute on chronic in setting of melena. ?CBC on 09/22/2021   ?WBC 9.3 HGB 7.1 MCV 81.0 Platelets 617 ?Anemia studies on 09/21/2021  ?Iron 33 Ferritin 13 B12 481 ? ?COPD with chronic hypoxemia ?Chronically on 2 L oxygen at home. ? ?Coronary artery disease ?Having chest pain, negative troponins and EKG. ?Echo 08/2021 ejection fraction 60-45, grade 2 diastolic dysfunction, moderately elevated pulmonary artery systolic pressure, mild AS ? ?Type 2 diabetes ? ?Peripheral vascular disease ?tatus post right SFA stent 2018 ? ? Plan  ? ?-Pending today's CBC ?-Protonix 40 mg IV BID ?-Clear liquid diet, NPO at midnight. ?--Continue to monitor H&H with transfusion as needed to maintain hemoglobin greater than 8 given cardiac history. ?-Plan for small bowel enteroscopy today if anesthesia allows, if negative consider colonoscopy with capsule endoscopy.Marland Kitchen  ?I thoroughly discussed the procedure to include nature, alternatives, benefits, and risks including but not limited to bleeding, perforation, infection, anesthesia/cardiac and pulmonary complications.  ?With patient's multiple comorbidities, she understands she is high risk for procedures. ?Patient provides understanding and gave verbal consent to proceed. ?-Would give 10 mg  IV Reglan 30 minutes prior to endoscopy ? ? ?Thank you for your kind consultation, we will continue to follow. ?       ? HPI:   ?[name] is a 71 y.o. female with past medical history significant for hypertension, hyperlipidemia, sleep apnea without CPAP use, peripheral arterial disease status post right SFA stent 2018, type 2 diabetes, coronary artery disease (Echo 08/2021 ejection fraction 40-98, grade 2 diastolic dysfunction, moderately elevated pulmonary artery systolic pressure, mild AS), COPD with chronic hypoxic respiratory failure on 2 L home oxygen, GERD, history of multiple small bowel enteroscopy's in the past for bleeding AVMs in duodenum and jejunum requiring blood transfusions. ?On outpatient Protonix 40 mg daily. ?Last hospitalization 04/2021 for anemia- SVE 05/11/2021 with Dr. Almyra Free a few bleeding angiodysplastic lesions duodenum status post MR clips, nonbleeding angiodysplastic lesions jejunum treated with monopolar probe. ? ?Patient presented to the ER 09/21/2021 with weakness and melena for 1 week.   ?Had scant melena on exam and FOBT positive.  Hemoglobin found to be 5.3 was 8.13 weeks ago. ?Thrombocytosis platelet count 795. ?Negative troponin, chest x-ray cardiomegaly stable. ?Patient given IV Protonix 80 mg and 2 units packed red blood cells. ? ?Patient states has been having melena for the past week, some loose stools, could be small to large volume.  Denies hematochezia.  Denies abdominal pain, nausea, vomiting. ?Last bowel movement was yesterday morning small volume. ?Patient was having dyspnea with exertion, substernal chest pain. ?Shortness of breath has improved some, she continues on home requirement of oxygen 2 L nasal cannula. ?Patient has had some chest discomfort this morning, given Mylanta this morning with some  improvement.  Has not had troponin negative. ?Patient does complain of worsening bilateral leg edema which is started in the last 6 months ?She denies NSAID or  EtOH use. ? ? ?Past Medical History:  ?Diagnosis Date  ? Anemia 12/26/2015  ? Anxiety   ? Arthritis   ? Blood transfusion without reported diagnosis   ? Chronic pain   ? resolved per pt 04/12/20  ? COPD (chronic obstructive pulmonary disease) ([place])   ? Coronary artery disease   ? Depression   ? Diabetes mellitus without complication ([place])   ? type 2  ? GERD (gastroesophageal reflux disease)   ? Heart murmur   ? since birth; Echo 02/18/19: LVEF 85%, grade 1 diastolic dysfunction, mild AS (mean grad 12 mmHg), trace MR/PR, mild TR, PASP 32 mmHg    ? Hyperlipemia   ? Hypertension   ? PVD (peripheral vascular disease) ([place])   ? right SFA stent 02/11/17 by Dr. Einar Gip  ? Sleep apnea   ? does not use cpap  ? Wears glasses   ? ? ?Surgical History:  ?She  has a past surgical history that includes Cesarean section; Uterine fibroid surgery; Hand surgery; Abdominal hysterectomy; stent in right leg (12/29/2012); Esophagogastroduodenoscopy (N/A, 01/08/2013); Colonoscopy (N/A, 01/08/2013); tympanomastoidectomy (Left, 03/08/2014); lower extremity angiogram (N/A, 12/29/2012); left heart catheterization with coronary angiogram (N/A, 03/09/2013); lower extremity angiogram (N/A, 10/05/2013); tympanomastoidectomy (2015); enteroscopy (N/A, 12/28/2015); Colonoscopy (N/A, 12/28/2015); Hot hemostasis (N/A, 12/28/2015); Lower Extremity Angiography (N/A, 02/11/2017); PERIPHERAL VASCULAR BALLOON ANGIOPLASTY (02/11/2017); PERIPHERAL VASCULAR INTERVENTION (Right, 02/11/2017); enteroscopy (N/A, 02/20/2018); Hot hemostasis (N/A, 02/20/2018); Cataract extraction; Eye surgery; enteroscopy (N/A, 03/27/2018); Hot hemostasis (N/A, 03/27/2018); Cardiac catheterization; Total knee arthroplasty (Left, 04/18/2020); Total knee arthroplasty (Left, 04/18/2020); enteroscopy (N/A, 10/05/2020); Colonoscopy with propofol (N/A, 10/05/2020); Hot hemostasis (N/A, 10/05/2020); polypectomy (10/05/2020); enteroscopy (N/A, 05/11/2021); Hot hemostasis (N/A, 05/11/2021); and Hemostasis clip placement  (05/11/2021). ?Family History:  ?Her family history includes Diabetes in her brother, brother, and mother; Heart disease in her father and mother; Hypertension in her brother, brother, brother, brother, brother, father, mother, sister, and sister. ?Social History:  ? reports that she has quit smoking. Her smoking use included cigarettes. She has a 20.50 pack-year smoking history. She has never used smokeless tobacco. She reports current alcohol use. She reports current drug use. Drugs: Marijuana and Cocaine. ? ?Prior to Admission medications   ?Medication Sig Start Date End Date Taking? Authorizing Provider  ?acetaminophen (TYLENOL) 500 MG tablet Take 1,000 mg by mouth daily as needed for moderate pain.   Yes [provider]  ?albuterol (PROVENTIL HFA;VENTOLIN HFA) 108 (90 Base) MCG/ACT inhaler Inhale 1-2 puffs into the lungs every 6 (six) hours as needed for wheezing or shortness of breath.   Yes [provider]  ?ASPIRIN LOW DOSE 81 MG EC tablet TAKE 1 TABLET(81 MG) BY MOUTH DAILY ?Patient taking differently: Take 81 mg by mouth daily. 06/30/20  Yes Magnant, Charles L, PA-C  ?atorvastatin (LIPITOR) 80 MG tablet Take 80 mg by mouth daily. 08/08/20  Yes [provider]  ?cilostazol (PLETAL) 100 MG tablet Take 1 tablet (100 mg total) by mouth 2 (two) times daily. 09/10/21 03/09/22 Yes [name], [name], DO  ?doxycycline (VIBRAMYCIN) 100 MG capsule Take 100 mg by mouth See admin instructions. Bid x 10 days 09/19/21  Yes [provider]  ?ezetimibe (ZETIA) 10 MG tablet Take 1 tablet (10 mg total) by mouth daily. 08/14/21 11/12/21 Yes [name], [name], DO  ?ferrous sulfate 325 (65 FE) MG tablet Take 325 mg by mouth  daily.   Yes [provider]  ?fluticasone (FLONASE) 50 MCG/ACT nasal spray Place 1 spray into both nostrils daily as needed for allergies. 08/14/21  Yes [provider]  ?gabapentin (NEURONTIN) 600 MG tablet Take 1 tablet (600 mg total) by mouth at bedtime. ?Patient taking  differently: Take 600 mg by mouth 2 (two) times daily. 10/02/17  Yes Hyatt, Max T, DPM  ?ipratropium-albuterol (DUONEB) 0.5-2.5 (3) MG/3ML SOLN Take 3 mLs by nebulization 3 (three) times daily. 11/19/

## 2021-09-22 NOTE — Evaluation (Signed)
Physical Therapy Evaluation and Discharge ?Patient Details ?Name: [name] ?MRN: [id number] ?DOB: 10-06-1950 ?Today's Date: 09/22/2021 ? ?History of Present Illness ? 71 y.o. female presents to the ED 09/21/21 for evaluation of dark stools, generalized weakness, and low hemoglobin (5.3) found by her PCP. Given 2 units PRBC for upper GI bleed. PMH significant of chronic anemia, chronic thrombocytosis, anxiety, depression, arthritis, COPD, chronic hypoxic respiratory failure on 2 L home oxygen, CAD, non-insulin-dependent type 2 diabetes, GERD, hypertension, hyperlipidemia, PVD,  sleep apnea, obesity, history of prior GI bleed  ?Clinical Impression ?  ?Patient evaluated by Physical Therapy with no further acute PT needs identified. Patient did require 2L of oxygen to ambulate 150 ft with no device. PT is signing off. Thank you for this referral. ?   ?   ? ?Recommendations for follow up therapy are one component of a multi-disciplinary discharge planning process, led by the attending physician.  Recommendations may be updated based on patient status, additional functional criteria and insurance authorization. ? ?Follow Up Recommendations No PT follow up ? ?  ?Assistance Recommended at Discharge None  ?Patient can return home with the following ?   ? ?  ?Equipment Recommendations None recommended by PT  ?Recommendations for Other Services ? OT consult (for ADLs and energy conservation)  ?  ?Functional Status Assessment Patient has not had a recent decline in their functional status  ? ?  ?Precautions / Restrictions Precautions ?Precautions: Fall ?Restrictions ?Weight Bearing Restrictions: No  ? ?  ? ?Mobility ? Bed Mobility ?Overal bed mobility: Modified Independent ?  ?  ?  ?  ?  ?  ?General bed mobility comments: HOB elevated ~30 degrees ?  ? ?Transfers ?Overall transfer level: Independent ?Equipment used: None ?  ?  ?  ?  ?  ?  ?  ?  ?  ? ?Ambulation/Gait ?Ambulation/Gait assistance: Independent ?Gait Distance  (Feet): 150 Feet ?Assistive device: None ?Gait Pattern/deviations: WFL(Within Functional Limits) ?  ?  ?  ?  ? ?Stairs ?  ?  ?  ?  ?  ? ?Wheelchair Mobility ?  ? ?Modified Rankin (Stroke Patients Only) ?  ? ?  ? ?Balance Overall balance assessment: Independent ?  ?  ?  ?  ?  ?  ?  ?  ?  ?  ?  ?  ?  ?  ?  ?  ?  ?  ?   ? ? ? ?Pertinent Vitals/Pain Pain Assessment ?Pain Assessment: No/denies pain  ? ? ?Home Living Family/patient expects to be discharged to:: Private residence ?Living Arrangements: Children ?Available Help at Discharge: Family ?Type of Home: House ?Home Access: Stairs to enter ?Entrance Stairs-Rails: Left ?Entrance Stairs-Number of Steps: 5 ?  ?Home Layout: One level ?Home Equipment: Rollator (4 wheels);Cane - single point;Other (comment) (home O2) ?   ?  ?Prior Function Prior Level of Function : Independent/Modified Independent ?  ?  ?  ?  ?  ?  ?Mobility Comments: denies use of assistive device; denies falls ?  ?  ? ? ?Hand Dominance  ? Dominant Hand: Right ? ?  ?Extremity/Trunk Assessment  ? Upper Extremity Assessment ?Upper Extremity Assessment: Defer to OT evaluation ?  ? ?Lower Extremity Assessment ?Lower Extremity Assessment: Overall WFL for tasks assessed ?  ? ?Cervical / Trunk Assessment ?Cervical / Trunk Assessment: Normal  ?Communication  ? Communication: No difficulties  ?Cognition Arousal/Alertness: Awake/alert ?Behavior During Therapy: [facility] for tasks assessed/performed ?Overall Cognitive Status: Within Functional Limits for tasks  assessed ?  ?  ?  ?  ?  ?  ?  ?  ?  ?  ?  ?  ?  ?  ?  ?  ?  ?  ?  ? ?  ?General Comments General comments (skin integrity, edema, etc.): see ambulatory oxygen assessment note ? ?  ?Exercises    ? ?Assessment/Plan  ?  ?PT Assessment Patient does not need any further PT services  ?PT Problem List   ? ?   ?  ?PT Treatment Interventions     ? ?PT Goals (Current goals can be found in the Care Plan section)  ?Acute Rehab PT Goals ?Patient Stated Goal: go home  tomorrow ?PT Goal Formulation: All assessment and education complete, DC therapy ? ?  ?Frequency   ?  ? ? ?Co-evaluation   ?  ?  ?  ?  ? ? ?  ?AM-PAC PT "6 Clicks" Mobility  ?Outcome Measure Help needed turning from your back to your side while in a flat bed without using bedrails?: None ?Help needed moving from lying on your back to sitting on the side of a flat bed without using bedrails?: None ?Help needed moving to and from a bed to a chair (including a wheelchair)?: None ?Help needed standing up from a chair using your arms (e.g., wheelchair or bedside chair)?: None ?Help needed to walk in hospital room?: None ?Help needed climbing 3-5 steps with a railing? : None ?6 Click Score: 24 ? ?  ?End of Session Equipment Utilized During Treatment: Oxygen ?Activity Tolerance: Patient tolerated treatment well ?Patient left: in bed;with call bell/phone within reach;with bed alarm set ?Nurse Communication: Mobility status ?PT Visit Diagnosis: Difficulty in walking, not elsewhere classified (R26.2) ?  ? ?Time: 1287-8676 ?PT Time Calculation (min) (ACUTE ONLY): 16 min ? ? ?Charges:   PT Evaluation ?$PT Eval Low Complexity: 1 Low ?  ?  ?   ? ? ? ?Arby Barrette, PT ?Acute Rehabilitation Services  ?Pager [phone] ?Office [phone] ? ? ?Jeanie Cooks Lacorey Brusca ?09/22/2021, 10:22 AM ? ?

## 2021-09-22 NOTE — Care Management Obs Status (Signed)
MEDICARE OBSERVATION STATUS NOTIFICATION ? ? ?Patient Details  ?Name: [name] ?MRN: [id number] ?Date of Birth: [birth year] ? ? ?Medicare Observation Status Notification Given:  Yes ? ? ? ?[name], RN ?09/22/2021, 4:55 PM ?

## 2021-09-22 NOTE — Care Management (Signed)
After entering room, notified by nurse tech that patient had +COVID test.  ?Notified MD and placed verbal order for isolation precautions.  ?

## 2021-09-22 NOTE — Progress Notes (Signed)
SATURATION QUALIFICATIONS: (This note is used to comply with regulatory documentation for home oxygen) ? ?Patient Saturations on Room Air at Rest = 90% ? ?Patient Saturations on Room Air while Ambulating = 86% ? ?Patient Saturations on 2 Liters of oxygen while Ambulating = 90% ? ?Please briefly explain why patient needs home oxygen: ? ?To maintain oxygen saturation >87% during functional activities.  ? ? ?Arby Barrette, PT ?Acute Rehabilitation Services  ?Pager [phone] ?Office [phone] ? ?

## 2021-09-22 NOTE — Evaluation (Signed)
Occupational Therapy Evaluation ?Patient Details ?Name: [name] ?MRN: [id number] ?DOB: Feb 06, 1951 ?Today's Date: 09/22/2021 ? ? ?History of Present Illness 71 y.o. female presents to the ED 09/21/21 for evaluation of dark stools, generalized weakness, and low hemoglobin (5.3) found by her PCP. Given 2 units PRBC for upper GI bleed. PMH significant of chronic anemia, chronic thrombocytosis, anxiety, depression, arthritis, COPD, chronic hypoxic respiratory failure on 2 L home oxygen, CAD, non-insulin-dependent type 2 diabetes, GERD, hypertension, hyperlipidemia, PVD,  sleep apnea, obesity, history of prior GI bleed  ? ?Clinical Impression ?  ?Pt admitted for concerns listed above. PTA pt reported that she was independent with all ADL's and IADL's, including driving. At this time, pt presents with mild weakness and O2 needs, however she is able to perform BADL's and functional mobility at her baseline with no assist or AD. She has no further OT needs at this time and acute OT will sign off.   ?   ? ?Recommendations for follow up therapy are one component of a multi-disciplinary discharge planning process, led by the attending physician.  Recommendations may be updated based on patient status, additional functional criteria and insurance authorization.  ? ?Follow Up Recommendations ? No OT follow up  ?  ?Assistance Recommended at Discharge None  ?Patient can return home with the following   ? ?  ?Functional Status Assessment ? Patient has had a recent decline in their functional status and demonstrates the ability to make significant improvements in function in a reasonable and predictable amount of time.  ?Equipment Recommendations ? None recommended by OT  ?  ?Recommendations for Other Services   ? ? ?  ?Precautions / Restrictions Precautions ?Precautions: Fall ?Restrictions ?Weight Bearing Restrictions: No  ? ?  ? ?Mobility Bed Mobility ?Overal bed mobility: Modified Independent ?  ?  ?  ?  ?  ?  ?  ?   ? ?Transfers ?Overall transfer level: Independent ?Equipment used: None ?  ?  ?  ?  ?  ?  ?  ?  ?  ? ?  ?Balance Overall balance assessment: No apparent balance deficits (not formally assessed) ?  ?  ?  ?  ?  ?  ?  ?  ?  ?  ?  ?  ?  ?  ?  ?  ?  ?  ?   ? ?ADL either performed or assessed with clinical judgement  ? ?ADL Overall ADL's : Modified independent;At baseline ?  ?  ?  ?  ?  ?  ?  ?  ?  ?  ?  ?  ?  ?  ?  ?  ?  ?  ?  ?General ADL Comments: Pt hasno difficulties with ADL's, overall at her baseline. Mild weakness noted, however is able to mobilize and complete tasks with no physical assist.  ? ? ? ?Vision Baseline Vision/History: 1 Wears glasses ?Ability to See in Adequate Light: 0 Adequate ?Patient Visual Report: No change from baseline ?Vision Assessment?: No apparent visual deficits  ?   ?Perception   ?  ?Praxis   ?  ? ?Pertinent Vitals/Pain Pain Assessment ?Pain Assessment: No/denies pain  ? ? ? ?Hand Dominance Right ?  ?Extremity/Trunk Assessment Upper Extremity Assessment ?Upper Extremity Assessment: Overall WFL for tasks assessed ?  ?Lower Extremity Assessment ?Lower Extremity Assessment: Defer to PT evaluation ?  ?Cervical / Trunk Assessment ?Cervical / Trunk Assessment: Normal ?  ?Communication Communication ?Communication: No difficulties ?  ?Cognition Arousal/Alertness: Awake/alert ?  Behavior During Therapy: [facility] for tasks assessed/performed ?Overall Cognitive Status: Within Functional Limits for tasks assessed ?  ?  ?  ?  ?  ?  ?  ?  ?  ?  ?  ?  ?  ?  ?  ?  ?  ?  ?  ?General Comments  VSS on 2L, ? ?  ?Exercises   ?  ?Shoulder Instructions    ? ? ?Home Living Family/patient expects to be discharged to:: Private residence ?Living Arrangements: Children ?Available Help at Discharge: Family ?Type of Home: House ?Home Access: Stairs to enter ?Entrance Stairs-Number of Steps: 5 ?Entrance Stairs-Rails: Left ?Home Layout: One level ?  ?  ?Bathroom Shower/Tub: Walk-in shower ?  ?Bathroom Toilet: Handicapped  height ?Bathroom Accessibility: Yes ?How Accessible: Accessible via walker ?Home Equipment: Rollator (4 wheels);Cane - single point;Other (comment) ?  ?  ?  ? ?  ?Prior Functioning/Environment Prior Level of Function : Independent/Modified Independent ?  ?  ?  ?  ?  ?  ?Mobility Comments: denies use of assistive device; denies falls ?  ?  ? ?  ?  ?OT Problem List: Decreased strength;Decreased activity tolerance;Cardiopulmonary status limiting activity ?  ?   ?OT Treatment/Interventions:    ?  ?OT Goals(Current goals can be found in the care plan section) Acute Rehab OT Goals ?Patient Stated Goal: To get better, eat, and go home ?OT Goal Formulation: All assessment and education complete, DC therapy ?Time For Goal Achievement: 09/22/21 ?Potential to Achieve Goals: Good  ?OT Frequency:   ?  ? ?Co-evaluation   ?  ?  ?  ?  ? ?  ?AM-PAC OT "6 Clicks" Daily Activity     ?Outcome Measure Help from another person eating meals?: None ?Help from another person taking care of personal grooming?: None ?Help from another person toileting, which includes using toliet, bedpan, or urinal?: None ?Help from another person bathing (including washing, rinsing, drying)?: None ?Help from another person to put on and taking off regular upper body clothing?: None ?Help from another person to put on and taking off regular lower body clothing?: None ?6 Click Score: 24 ?  ?End of Session Equipment Utilized During Treatment: Oxygen ?Nurse Communication: Mobility status ? ?Activity Tolerance: Patient tolerated treatment well ?Patient left: in bed;with call bell/phone within reach ? ?OT Visit Diagnosis: Unsteadiness on feet (R26.81);Other abnormalities of gait and mobility (R26.89);Muscle weakness (generalized) (M62.81)  ?              ?Time: 1610-9604 ?OT Time Calculation (min): 27 min ?Charges:  OT General Charges ?$OT Visit: 1 Visit ?OT Evaluation ?$OT Eval Moderate Complexity: 1 Mod ?OT Treatments ?$Self Care/Home Management : 8-22  mins ? ?Oveda Dadamo H., OTR/L ?Acute Rehabilitation ? ?Brynnan Rodenbaugh Elane Yolanda Bonine ?09/22/2021, 6:37 PM ?

## 2021-09-22 NOTE — Progress Notes (Addendum)
?                                  PROGRESS NOTE                                             ?                                                                                                                     ?                                         ? ? Patient Demographics:  ? ? [name], is a 71 y.o. female, DOB - March 01, 1951, EHU:[id number] ? ?Outpatient Primary MD for the patient is Osei-Bonsu, Iona Beard, MD    LOS - 0  Admit date - 09/21/2021   ? ?Chief Complaint  ?Patient presents with  ? Anemia  ?    ? ?Brief Narrative (HPI from H&P)   71 y.o. female with medical history significant of chronic anemia, chronic thrombocytosis, anxiety, depression, arthritis, COPD, chronic hypoxic respiratory failure on 2 L home oxygen, CAD, non-insulin-dependent type 2 diabetes, GERD, hypertension, hyperlipidemia, PVD status post PV angiogram with revascularization of the SFA, sleep apnea, obesity, history of prior GI bleed presents to the ED for evaluation of dark stools, in the ER work-up was positive for severe anemia along with heme positive stools and she was admitted for further work-up ? ? Subjective:  ? ? [place] today has, No headache, +ve 7 am chest pain, No abdominal pain - No Nausea, No new weakness tingling or numbness, no SOB ? ? Assessment  & Plan :  ? ?Acute upper GI bleed - Symptomatic acute blood loss anemia -  Patient is presenting with a 2-week history of melena.  She takes aspirin 81 mg daily.  Occult positive, getting 2 units of packed RBC transfusion, IV PPI to be continued, GI on board going for EGD later today.  Continue to monitor H&H transfuse as needed goal to keep hemoglobin around 7.5. ? ?Thrombocytosis - Possibly due to history of cigarette smoking.  Patient reports past history of heavy cigarette smoking, quit now. Platelet count 795k, chronically elevated and was 522k on labs done 3 weeks ago.  EP to continue to monitor if needed outpatient hematology  follow-up. ?  ?Chest pain - Likely due to anemia.  High-sensitivity troponin negative.  EKG unremarkable this 7am, could be from gastritis or esophagitis, PPI along with single dose Maalox.  Monitor. ?  ?Coronary artery disease - Hold aspirin, zoom statin and placed on beta-blocker for secondary prevention. ? ?COPD (chronic obstructive pulmonary disease) (HCC) Chronic hypoxic respiratory failure  - Stable, not  wheezing.  Satting well on 2 L home oxygen.  Home inhalers continued. ?  ?PVD (peripheral vascular disease) (HCC)  - Hold aspirin and statin for secondary prevention. ? ?HTN (hypertension) - Stable. Start beta-blocker.  Half home dose hydralazine later today and low-dose ACE inhibitor from tomorrow if blood pressure tolerates. ? ?Non-insulin dependent type 2 diabetes mellitus (HCC) -  Check A1c, Sliding scale insulin sensitive every 4 hours. ? ?Lab Results  ?Component Value Date  ? HGBA1C 4.9 09/21/2021  ? ? ?CBG (last 3)  ?Recent Labs  ?  09/22/21 ?0020 09/22/21 ?0451 09/22/21 ?1610  ?GLUCAP 136* 99 93  ? ? ?Incidental Covid +ve - testing in the ER - precautions. ? ? ?   ? ?Condition -  Guarded ? ?Family Communication  :  daughter Rutherford Nail [phone] on 09/22/2021 ? ?Code Status :  Full ? ?Consults  :  GI ? ?PUD Prophylaxis : PPI ? ? Procedures  :    ? ? ? ?   ? ?Disposition Plan  :   ? ?Status is: Observation ? ?DVT Prophylaxis  :   ? ?SCDs Start: 09/21/21 1947 ? ?  ? ?Lab Results  ?Component Value Date  ? PLT 735 (H) 09/22/2021  ? ? ?Diet :  ?Diet Order   ? ?       ?  Diet NPO time specified  Diet effective midnight       ?  ? ?  ?  ? ?  ?  ? ?Inpatient Medications ? ?Scheduled Meds: ? insulin aspart  0-9 Units Subcutaneous Q4H  ? pantoprazole (PROTONIX) IV  40 mg Intravenous Q12H  ? ?Continuous Infusions: ?PRN Meds:.acetaminophen **OR** acetaminophen ? ?Antibiotics  :   ? ?Anti-infectives (From admission, onward)  ? ? None  ? ?  ? ? ? Time Spent in minutes  30 ? ? ?Lala Lund M.D on 09/22/2021 at 10:01  AM ? ?To page go to [email or web address]  ? ?[facility] -  Office  [phone] ? ?See all Orders from today for further details ? ? ? Objective:  ? ?Vitals:  ? 09/22/21 0015 09/22/21 0100 09/22/21 0417 09/22/21 0728  ?BP:  114/60 116/76 122/76  ?Pulse: 78 80 80 75  ?Resp: 20 17 (!) 24 (!) 23  ?Temp:  98.1 ?F (36.7 ?C) 98.2 ?F (36.8 ?C) 97.9 ?F (36.6 ?C)  ?TempSrc:  Oral Oral Oral  ?SpO2: 100% 100% 99% 100%  ?Weight:      ?Height:      ? ? ?Wt Readings from Last 3 Encounters:  ?09/21/21 92 kg  ?09/10/21 92.1 kg  ?08/23/21 95.7 kg  ? ? ? ?Intake/Output Summary (Last 24 hours) at 09/22/2021 1001 ?Last data filed at 09/22/2021 0100 ?Gross per 24 hour  ?Intake 1435.29 ml  ?Output --  ?Net 1435.29 ml  ? ? ? ?Physical Exam ? ?Awake Alert, No new F.N deficits, Normal affect ?[place].AT,PERRAL ?Supple Neck, No JVD,   ?Symmetrical Chest wall movement, Good air movement bilaterally, CTAB ?RRR,No Gallops,Rubs or new Murmurs,  ?+ve B.Sounds, Abd Soft, No tenderness,   ?No Cyanosis, Clubbing or edema  ?  ?  ? ? Data Review:  ? ? ?CBC ?Recent Labs  ?Lab 09/21/21 ?1500 09/22/21 ?0130 09/22/21 ?0928  ?WBC 8.9 9.3 9.7  ?HGB 5.3* 7.1* 7.5*  ?HCT 20.3* 24.7* 26.4*  ?PLT 795* 617* 735*  ?MCV 84.2 81.0 83.0  ?MCH 22.0* 23.3* 23.6*  ?MCHC 26.1* 28.7* 28.4*  ?RDW 25.8* 21.9* 21.9*  ? ? ?Electrolytes ?Recent  Labs  ?Lab 09/21/21 ?1500 09/21/21 ?1816  ?NA 141  --   ?K 3.7  --   ?CL 108  --   ?CO2 27  --   ?GLUCOSE 124*  --   ?BUN 10  --   ?CREATININE 0.82  --   ?CALCIUM 8.8*  --   ?AST 13*  --   ?ALT 14  --   ?ALKPHOS 46  --   ?BILITOT 0.6  --   ?ALBUMIN 2.8*  --   ?INR  --  1.1  ?HGBA1C 4.9  --   ? ? ?------------------------------------------------------------------------------------------------------------------ ?No results for input(s): CHOL, HDL, LDLCALC, TRIG, CHOLHDL, LDLDIRECT in the last 72 hours. ? ?Lab Results  ?Component Value Date  ? HGBA1C 4.9 09/21/2021  ? ? ?No results for input(s): TSH, T4TOTAL, T3FREE, THYROIDAB in the last 72  hours. ? ?Invalid input(s): FREET3 ?------------------------------------------------------------------------------------------------------------------ ?ID Labs ?Recent Labs  ?Lab 09/21/21 ?1500 09/22/21 ?0130 09/22/21 ?0928  ?WBC 8.9 9.3 9.7  ?PLT 795* 617* 735*  ?CREATININE 0.82  --   --   ? ?Cardiac Enzymes ?No results for input(s): CKMB, TROPONINI, MYOGLOBIN in the last 168 hours. ? ?Invalid input(s): CK ? ?Radiology Reports ?DG Chest 2 View ? ?Result Date: 09/21/2021 ?CLINICAL DATA:  Chest pain with shortness of breath. EXAM: CHEST - 2 VIEW COMPARISON:  Chest x-ray 08/23/2021 FINDINGS: The heart is mildly enlarged, unchanged. There is some atelectatic changes in the right mid lung. No pleural effusion or pneumothorax identified. No acute fractures. IMPRESSION: 1. Stable mild cardiomegaly. 2. Right mid lung atelectasis. Electronically Signed   By: Ronney Asters M.D.   On: 09/21/2021 15:23    ? ? ?

## 2021-09-23 ENCOUNTER — Observation Stay (HOSPITAL_COMMUNITY): Payer: Medicare Other | Admitting: Certified Registered Nurse Anesthetist

## 2021-09-23 ENCOUNTER — Encounter (HOSPITAL_COMMUNITY)
Admission: EM | Disposition: A | Discharge status: Home / Self Care | Payer: Self-pay | Source: Home / Self Care | Attending: Internal Medicine

## 2021-09-23 ENCOUNTER — Encounter (HOSPITAL_COMMUNITY): Payer: Self-pay | Admitting: Internal Medicine

## 2021-09-23 DIAGNOSIS — K921 Melena: Secondary | ICD-10-CM

## 2021-09-23 DIAGNOSIS — D500 Iron deficiency anemia secondary to blood loss (chronic): Secondary | ICD-10-CM

## 2021-09-23 DIAGNOSIS — K5521 Angiodysplasia of colon with hemorrhage: Secondary | ICD-10-CM

## 2021-09-23 DIAGNOSIS — K3189 Other diseases of stomach and duodenum: Secondary | ICD-10-CM

## 2021-09-23 DIAGNOSIS — K922 Gastrointestinal hemorrhage, unspecified: Secondary | ICD-10-CM | POA: Diagnosis not present

## 2021-09-23 DIAGNOSIS — T183XXA Foreign body in small intestine, initial encounter: Secondary | ICD-10-CM

## 2021-09-23 DIAGNOSIS — K5520 Angiodysplasia of colon without hemorrhage: Secondary | ICD-10-CM | POA: Diagnosis not present

## 2021-09-23 HISTORY — PX: ENTEROSCOPY: SHX5533

## 2021-09-23 LAB — CBC WITH DIFFERENTIAL/PLATELET
Abs Immature Granulocytes: 0.1 10*3/uL — ABNORMAL HIGH (ref 0.00–0.07)
Band Neutrophils: 1 %
Basophils Absolute: 0 10*3/uL (ref 0.0–0.1)
Basophils Relative: 0 %
Eosinophils Absolute: 0.5 10*3/uL (ref 0.0–0.5)
Eosinophils Relative: 5 %
HCT: 26.8 % — ABNORMAL LOW (ref 36.0–46.0)
Hemoglobin: 7.6 g/dL — ABNORMAL LOW (ref 12.0–15.0)
Lymphocytes Relative: 10 %
Lymphs Abs: 0.9 10*3/uL (ref 0.7–4.0)
MCH: 23.5 pg — ABNORMAL LOW (ref 26.0–34.0)
MCHC: 28.4 g/dL — ABNORMAL LOW (ref 30.0–36.0)
MCV: 82.7 fL (ref 80.0–100.0)
Monocytes Absolute: 0.4 10*3/uL (ref 0.1–1.0)
Monocytes Relative: 4 %
Myelocytes: 1 %
Neutro Abs: 7.3 10*3/uL (ref 1.7–7.7)
Neutrophils Relative %: 79 %
Platelets: 744 10*3/uL — ABNORMAL HIGH (ref 150–400)
RBC: 3.24 MIL/uL — ABNORMAL LOW (ref 3.87–5.11)
RDW: 22.2 % — ABNORMAL HIGH (ref 11.5–15.5)
Smear Review: INCREASED
WBC: 9.1 10*3/uL (ref 4.0–10.5)
nRBC: 0.4 % — ABNORMAL HIGH (ref 0.0–0.2)
nRBC: 1 /100 WBC — ABNORMAL HIGH

## 2021-09-23 LAB — CBC
HCT: 27.4 % — ABNORMAL LOW (ref 36.0–46.0)
Hemoglobin: 7.7 g/dL — ABNORMAL LOW (ref 12.0–15.0)
MCH: 23.5 pg — ABNORMAL LOW (ref 26.0–34.0)
MCHC: 28.1 g/dL — ABNORMAL LOW (ref 30.0–36.0)
MCV: 83.8 fL (ref 80.0–100.0)
Platelets: 781 10*3/uL — ABNORMAL HIGH (ref 150–400)
RBC: 3.27 MIL/uL — ABNORMAL LOW (ref 3.87–5.11)
RDW: 22.2 % — ABNORMAL HIGH (ref 11.5–15.5)
WBC: 9.4 10*3/uL (ref 4.0–10.5)
nRBC: 0.2 % (ref 0.0–0.2)

## 2021-09-23 LAB — GLUCOSE, CAPILLARY
Glucose-Capillary: 102 mg/dL — ABNORMAL HIGH (ref 70–99)
Glucose-Capillary: 105 mg/dL — ABNORMAL HIGH (ref 70–99)
Glucose-Capillary: 109 mg/dL — ABNORMAL HIGH (ref 70–99)
Glucose-Capillary: 110 mg/dL — ABNORMAL HIGH (ref 70–99)
Glucose-Capillary: 116 mg/dL — ABNORMAL HIGH (ref 70–99)
Glucose-Capillary: 120 mg/dL — ABNORMAL HIGH (ref 70–99)
Glucose-Capillary: 98 mg/dL (ref 70–99)

## 2021-09-23 LAB — COMPREHENSIVE METABOLIC PANEL
ALT: 11 U/L (ref 0–44)
AST: 16 U/L (ref 15–41)
Albumin: 2.7 g/dL — ABNORMAL LOW (ref 3.5–5.0)
Alkaline Phosphatase: 55 U/L (ref 38–126)
Anion gap: 4 — ABNORMAL LOW (ref 5–15)
BUN: 11 mg/dL (ref 8–23)
CO2: 29 mmol/L (ref 22–32)
Calcium: 8.6 mg/dL — ABNORMAL LOW (ref 8.9–10.3)
Chloride: 106 mmol/L (ref 98–111)
Creatinine, Ser: 0.76 mg/dL (ref 0.44–1.00)
GFR, Estimated: >60 mL/min (ref >60)
Glucose, Bld: 114 mg/dL — ABNORMAL HIGH (ref 70–99)
Potassium: 3.9 mmol/L (ref 3.5–5.1)
Sodium: 139 mmol/L (ref 135–145)
Total Bilirubin: <0.1 mg/dL — ABNORMAL LOW (ref 0.3–1.2)
Total Protein: 5.9 g/dL — ABNORMAL LOW (ref 6.5–8.1)

## 2021-09-23 LAB — C-REACTIVE PROTEIN: CRP: 0.6 mg/dL (ref <1.0)

## 2021-09-23 LAB — MAGNESIUM: Magnesium: 1.7 mg/dL (ref 1.7–2.4)

## 2021-09-23 SURGERY — CANCELLED PROCEDURE

## 2021-09-23 SURGERY — ENTEROSCOPY
Anesthesia: Monitor Anesthesia Care

## 2021-09-23 MED ORDER — PHENYLEPHRINE 40 MCG/ML (10ML) SYRINGE FOR IV PUSH (FOR BLOOD PRESSURE SUPPORT)
PREFILLED_SYRINGE | INTRAVENOUS | Status: DC | PRN
  Administered 2021-09-23: 120 ug via INTRAVENOUS
  Administered 2021-09-23: 80 ug via INTRAVENOUS

## 2021-09-23 MED ORDER — PROPOFOL 500 MG/50ML IV EMUL
INTRAVENOUS | Status: DC | PRN
  Administered 2021-09-23: 125 ug/kg/min via INTRAVENOUS

## 2021-09-23 MED ORDER — LACTATED RINGERS IV SOLN: INTRAVENOUS | Status: DC | PRN

## 2021-09-23 MED ORDER — PROPOFOL 10 MG/ML IV BOLUS
INTRAVENOUS | Status: DC | PRN
  Administered 2021-09-23: 20 mg via INTRAVENOUS

## 2021-09-23 NOTE — Transfer of Care (Signed)
Immediate Anesthesia Transfer of Care Note ? ?Patient: Sueann Brownley ? ?Procedure(s) Performed: ENTEROSCOPY ? ?Patient Location: Endoscopy Unit ? ?Anesthesia Type:MAC ? ?Level of Consciousness: drowsy ? ?Airway & Oxygen Therapy: Patient Spontanous Breathing and Patient connected to face mask oxygen ? ?Post-op Assessment: Report given to RN and Post -op Vital signs reviewed and stable ? ?Post vital signs: Reviewed and stable ? ?Last Vitals:  ?Vitals Value Taken Time  ?BP 125/79   ?Temp    ?Pulse 63   ?Resp 14   ?SpO2 100   ? ? ?Last Pain:  ?Vitals:  ? 09/23/21 0934  ?TempSrc: Tympanic  ?PainSc:   ?   ? ?  ? ?Complications: No notable events documented. ?

## 2021-09-23 NOTE — Progress Notes (Addendum)
?                                  PROGRESS NOTE                                             ?                                                                                                                     ?                                         ? ? Patient Demographics:  ? ? [name], is a 71 y.o. female, DOB - [birth year], LZJ:[id number] ? ?Outpatient Primary MD for the patient is Osei-Bonsu, Iona Beard, MD    LOS - 0  Admit date - 09/21/2021   ? ?Chief Complaint  ?Patient presents with  ? Anemia  ?    ? ?Brief Narrative (HPI from H&P)   71 y.o. female with medical history significant of chronic anemia, chronic thrombocytosis, anxiety, depression, arthritis, COPD, chronic hypoxic respiratory failure on 2 L home oxygen, CAD, non-insulin-dependent type 2 diabetes, GERD, hypertension, hyperlipidemia, PVD status post PV angiogram with revascularization of the SFA, sleep apnea, obesity, history of prior GI bleed presents to the ED for evaluation of dark stools, in the ER work-up was positive for severe anemia along with heme positive stools and she was admitted for further work-up ? ? Subjective:  ? ?Patient in bed, appears comfortable, denies any headache, no fever, no chest pain or pressure, no shortness of breath , no abdominal pain. No new focal weakness. ? ? ? Assessment  & Plan :  ? ?Acute upper GI bleed - Symptomatic acute blood loss anemia with history of small bowel AVMs -  Patient is presenting with a 2-week history of melena.  She takes aspirin 81 mg daily.  Occult positive, so far has received 2 units of packed RBC transfusion on 09/22/2021, IV PPI to be continued, GI on board she is s/p small bowel enteroscopy on 09/23/2021 which did not show any active bleed, likely small bowel capsule endoscopy on 09/24/2021, continue to monitor H&H, slight downtrend in posttransfusion H&H may require further transfusion will monitor.  ? ? ?Thrombocytosis - Possibly due to history of  cigarette smoking.  Patient reports past history of heavy cigarette smoking, quit now. Platelet count 795k, chronically elevated and was 522k on labs done 3 weeks ago.  EP to continue to monitor if needed outpatient hematology follow-up. ?  ?Chest pain - Likely due to anemia.  High-sensitivity troponin negative.  EKG unremarkable this 7am, could be from gastritis or esophagitis, PPI along with single dose Maalox.  Monitor. ?  ?Coronary artery disease - Hold aspirin, zoom statin and placed on beta-blocker for secondary prevention. ? ?COPD (chronic obstructive pulmonary disease) (HCC) Chronic hypoxic respiratory failure  - Stable, not wheezing.  Satting well on 2 L home oxygen.  Home inhalers continued. ?  ?PVD (peripheral vascular disease) (HCC)  - Hold aspirin and statin for secondary prevention. ? ?HTN (hypertension) - Stable. Start beta-blocker.  Half home dose hydralazine later today and low-dose ACE inhibitor from tomorrow if blood pressure tolerates. ? ?Non-insulin dependent type 2 diabetes mellitus (HCC) -  Check A1c, Sliding scale insulin sensitive every 4 hours. ? ?Lab Results  ?Component Value Date  ? HGBA1C 4.9 09/21/2021  ? ? ?CBG (last 3)  ?Recent Labs  ?  09/23/21 ?0034 09/23/21 ?8295 09/23/21 ?6213  ?GLUCAP 116* 105* 102*  ? ? ?Incidental Covid +ve - testing in the ER - precautions.  CT count is 28.  Stable CRP and no pulmonary symptoms to speak of after transfusion. ? ? ?   ? ?Condition -  Guarded ? ?Family Communication  :  daughter Rutherford Nail [phone] on 09/22/2021 ? ?Code Status :  Full ? ?Consults  :  GI ? ?PUD Prophylaxis : PPI ? ? Procedures  :    ? ?Small bowel enteroscopy. 09/23/21 ? ?Impression:               - The examined portion of the jejunum was normal. ?                          - Mucosal duodenal nodule(s). ?                          - Duodenal foreign body. ?                          - No specimens collected. ?Recommendation:           - Return patient to hospital ward for ongoing  care. ?                          - Resume regular diet. ?                          - CBC this evening and tomorrow AM ?                          Observe in hospital for any passage of melena that  ?                          might occur. ?                          Dr. Benson Norway to assme consult care tomorrow and  ?                          determine need for and timing/location of small  ?                          bowel capsule study. ? ?   ? ?Disposition Plan  :   ? ?Status is: Observation ? ?DVT Prophylaxis  :   ? ?  SCDs Start: 09/21/21 1947 ? ?  ? ?Lab Results  ?Component Value Date  ? PLT 744 (H) 09/23/2021  ? ? ?Diet :  ?Diet Order   ? ?       ?  Diet NPO time specified  Diet effective now       ?  ? ?  ?  ? ?  ?  ? ?Inpatient Medications ? ?Scheduled Meds: ? atorvastatin  80 mg Oral Daily  ? ezetimibe  10 mg Oral Daily  ? ferrous sulfate  325 mg Oral Daily  ? insulin aspart  0-9 Units Subcutaneous Q4H  ? ipratropium-albuterol  3 mL Nebulization TID  ? lisinopril  5 mg Oral Daily  ? metoprolol tartrate  50 mg Oral BID  ? pantoprazole (PROTONIX) IV  40 mg Intravenous Q12H  ? spironolactone  50 mg Oral Daily  ? ?Continuous Infusions: ?PRN Meds:.acetaminophen **OR** acetaminophen, albuterol, metoprolol tartrate ? ?Antibiotics  :   ? ?Anti-infectives (From admission, onward)  ? ? None  ? ?  ? ? ? Time Spent in minutes  30 ? ? ?Lala Lund M.D on 09/23/2021 at 11:08 AM ? ?To page go to [email or web address]  ? ?[facility] -  Office  [phone] ? ?See all Orders from today for further details ? ? ? Objective:  ? ?Vitals:  ? 09/23/21 1004 09/23/21 1005 09/23/21 1014 09/23/21 1017  ?BP: 99/89 124/87 (!) 124/59   ?Pulse: (!) 59 (!) 57 (!) 57 (!) 59  ?Resp:   17   ?Temp:      ?TempSrc:      ?SpO2: 100% 100% 96% 98%  ?Weight:      ?Height:      ? ? ?Wt Readings from Last 3 Encounters:  ?09/23/21 92 kg  ?09/10/21 92.1 kg  ?08/23/21 95.7 kg  ? ? ? ?Intake/Output Summary (Last 24 hours) at 09/23/2021 1108 ?Last data filed at  09/23/2021 2703 ?Gross per 24 hour  ?Intake 100 ml  ?Output --  ?Net 100 ml  ? ? ? ?Physical Exam ? ?Awake Alert, No new F.N deficits, Normal affect ?[place].AT,PERRAL ?Supple Neck, No JVD,   ?Symmetrical Chest wall movement, Good air movement bilaterally, CTAB ?RRR,No Gallops, Rubs or new Murmurs,  ?+ve B.Sounds, Abd Soft, No tenderness,   ?No Cyanosis, Clubbing or edema  ? ?  ? ? Data Review:  ? ? ?CBC ?Recent Labs  ?Lab 09/22/21 ?0130 09/22/21 ?0928 09/22/21 ?1326 09/22/21 ?1744 09/23/21 ?0145  ?WBC 9.3 9.7 9.2 8.9 9.1  ?HGB 7.1* 7.5* 8.0* 7.9* 7.6*  ?HCT 24.7* 26.4* 27.3* 27.4* 26.8*  ?PLT 617* 735* 762* 747* 744*  ?MCV 81.0 83.0 81.5 81.5 82.7  ?MCH 23.3* 23.6* 23.9* 23.5* 23.5*  ?MCHC 28.7* 28.4* 29.3* 28.8* 28.4*  ?RDW 21.9* 21.9* 21.9* 22.1* 22.2*  ?LYMPHSABS  --   --   --   --  0.9  ?MONOABS  --   --   --   --  0.4  ?EOSABS  --   --   --   --  0.5  ?BASOSABS  --   --   --   --  0.0  ? ? ?Electrolytes ?Recent Labs  ?Lab 09/21/21 ?1500 09/21/21 ?1816 09/23/21 ?0145 09/23/21 ?5009  ?NA 141  --  139  --   ?K 3.7  --  3.9  --   ?CL 108  --  106  --   ?CO2 27  --  29  --   ?GLUCOSE 124*  --  114*  --   ?BUN 10  --  11  --   ?CREATININE 0.82  --  0.76  --   ?CALCIUM 8.8*  --  8.6*  --   ?AST 13*  --  16  --   ?ALT 14  --  11  --   ?ALKPHOS 46  --  55  --   ?BILITOT 0.6  --  <0.1*  --   ?ALBUMIN 2.8*  --  2.7*  --   ?MG  --   --  1.7  --   ?CRP  --   --   --  0.6  ?INR  --  1.1  --   --   ?HGBA1C 4.9  --   --   --   ? ? ?------------------------------------------------------------------------------------------------------------------ ?No results for input(s): CHOL, HDL, LDLCALC, TRIG, CHOLHDL, LDLDIRECT in the last 72 hours. ? ?Lab Results  ?Component Value Date  ? HGBA1C 4.9 09/21/2021  ? ? ?No results for input(s): TSH, T4TOTAL, T3FREE, THYROIDAB in the last 72 hours. ? ?Invalid input(s): FREET3 ?------------------------------------------------------------------------------------------------------------------ ?ID  Labs ?Recent Labs  ?Lab 09/21/21 ?1500 09/22/21 ?0130 09/22/21 ?0928 09/22/21 ?1326 09/22/21 ?1744 09/23/21 ?0145 09/23/21 ?1779  ?WBC 8.9 9.3 9.7 9.2 8.9 9.1  --   ?PLT 795* 617* 735* 762* 747* 744*  --   ?CRP  --   --   -

## 2021-09-23 NOTE — Plan of Care (Signed)

## 2021-09-23 NOTE — Interval H&P Note (Signed)
History and Physical Interval Note: ? ?09/23/2021 ?9:24 AM ? ?[name]  has presented today for surgery, with the diagnosis of anemia.  The various methods of treatment have been discussed with the patient and family. After consideration of risks, benefits and other options for treatment, the patient has consented to  Procedure(s): ?ENTEROSCOPY (N/A) as a surgical intervention.  The patient's history has been reviewed, patient examined, no change in status, stable for surgery.  I have reviewed the patient's chart and labs.  Questions were answered to the patient's satisfaction.   ? ?Signout received from Dr. Rush Landmark, and I performed a chart review.  GI consult note from yesterday reviewed in detail.  Patient seen and examined in the endoscopy room before the procedure. ?Patient known to Dr. Almyra Free for recurrent small bowel AVM bleeding with acute on chronic anemia. ?Also oxygen dependent COPD with pulmonary hypertension and current COVID infection. ? ?Plan is for small bowel enteroscopy with possible AVM ablation. ?Risk and benefits were reviewed with patient yesterday as noted in the GI consult report.  It should be additionally noted this patient is at increased risk for sedation and procedure related complications due to her medical comorbidities. ?Hemoglobin 7.6 today ? ?[name] ? ? ?

## 2021-09-23 NOTE — Anesthesia Preprocedure Evaluation (Signed)
Anesthesia Evaluation  ?Patient identified by MRN, date of birth, ID band ?Patient awake ? ? ? ?Reviewed: ?Allergy & Precautions, H&P , NPO status , Patient's Chart, lab work & pertinent test results ? ?Airway ?Mallampati: II ? ? ?Neck ROM: full ? ? ? Dental ?  ?Pulmonary ?sleep apnea , COPD, former smoker,  ?COVID+ ?  ?breath sounds clear to auscultation ? ? ? ? ? ? Cardiovascular ?hypertension, + CAD and + Peripheral Vascular Disease  ? ?Rhythm:regular Rate:Normal ? ? ?  ?Neuro/Psych ?  ? GI/Hepatic ?GERD  ,  ?Endo/Other  ?diabetes, Type 2 ? Renal/GU ?  ? ?  ?Musculoskeletal ? ?(+) Arthritis ,  ? Abdominal ?  ?Peds ? Hematology ? ?(+) Blood dyscrasia, anemia ,   ?Anesthesia Other Findings ? ? Reproductive/Obstetrics ? ?  ? ? ? ? ? ? ? ? ? ? ? ? ? ?  ?  ? ? ? ? ? ? ? ? ?Anesthesia Physical ?Anesthesia Plan ? ?ASA: 3 ? ?Anesthesia Plan: MAC  ? ?Post-op Pain Management:   ? ?Induction: Intravenous ? ?PONV Risk Score and Plan: 2 and Propofol infusion and Treatment may vary due to age or medical condition ? ?Airway Management Planned: Simple Face Mask ? ?Additional Equipment:  ? ?Intra-op Plan:  ? ?Post-operative Plan:  ? ?Informed Consent: I have reviewed the patients History and Physical, chart, labs and discussed the procedure including the risks, benefits and alternatives for the proposed anesthesia with the patient or authorized representative who has indicated his/her understanding and acceptance.  ? ? ? ?Dental advisory given ? ?Plan Discussed with: CRNA, Anesthesiologist and Surgeon ? ?Anesthesia Plan Comments:   ? ? ? ? ? ? ?Anesthesia Quick Evaluation ? ?

## 2021-09-23 NOTE — Anesthesia Procedure Notes (Signed)
Procedure Name: [place] ?Date/Time: 09/23/2021 9:49 AM ?Performed by: Dorthea Cove, CRNA ?Pre-anesthesia Checklist: Patient identified, Emergency Drugs available, Suction available, Patient being monitored and Timeout performed ?Patient Re-evaluated:Patient Re-evaluated prior to induction ?Oxygen Delivery Method: Simple face mask ?Preoxygenation: Pre-oxygenation with 100% oxygen ?Induction Type: IV induction ?Placement Confirmation: CO2 detector and positive ETCO2 ? ? ? ? ?

## 2021-09-23 NOTE — Anesthesia Postprocedure Evaluation (Signed)
Anesthesia Post Note ? ?Patient: [name] ? ?Procedure(s) Performed: ENTEROSCOPY ? ?  ? ?Patient location during evaluation: Endoscopy ?Anesthesia Type: MAC ?Level of consciousness: awake and alert ?Pain management: pain level controlled ?Vital Signs Assessment: post-procedure vital signs reviewed and stable ?Respiratory status: spontaneous breathing, nonlabored ventilation, respiratory function stable and patient connected to nasal cannula oxygen ?Cardiovascular status: stable and blood pressure returned to baseline ?Postop Assessment: no apparent nausea or vomiting ?Anesthetic complications: no ? ? ?No notable events documented. ? ?Last Vitals:  ?Vitals:  ? 09/23/21 1424 09/23/21 1634  ?BP:  134/85  ?Pulse: 72   ?Resp: 18 20  ?Temp:  36.7 ?C  ?SpO2: 98%   ?  ?Last Pain:  ?Vitals:  ? 09/23/21 1634  ?TempSrc: Oral  ?PainSc:   ? ? ?  ?  ?  ?  ?  ?  ? ?[place] S ? ? ? ? ?

## 2021-09-23 NOTE — Op Note (Signed)
[facility] ?Patient Name: [name] ?Procedure Date : 09/23/2021 ?MRN: [id number] ?Attending MD: Estill Cotta. Loletha Carrow , MD ?Date of Birth: [birth year] ?CSN: [id number] ?Age: 71 ?Admit Type: Inpatient ?Procedure:                Small bowel enteroscopy ?Indications:              Iron deficiency anemia secondary to chronic blood  ?                          loss, Melena, Angioectasia (multiple prior SBE with  ?                          APC ablation of AVMs, most recently Nov 2022) ?                          recent black stool at home reported by patient - no  ?                          melena documented in hospital ?Providers:                Mallie Mussel L. Loletha Carrow, MD, Elmer Ramp. Tilden Dome, RN, [place]  ?                          [place], [employer] ?Referring MD:             [facility] and Dr. Carol Ada ?Medicines:                Monitored Anesthesia Care ?Complications:            No immediate complications. ?Estimated Blood Loss:     Estimated blood loss: none. ?Procedure:                Pre-Anesthesia Assessment: ?                          - Prior to the procedure, a History and Physical  ?                          was performed, and patient medications and  ?                          allergies were reviewed. The patient's tolerance of  ?                          previous anesthesia was also reviewed. The risks  ?                          and benefits of the procedure and the sedation  ?                          options and risks were discussed with the patient.  ?                          All questions were answered, and informed consent  ?  was obtained. Prior Anticoagulants: The patient has  ?                          taken no previous anticoagulant or antiplatelet  ?                          agents. ASA Grade Assessment: IV - A patient with  ?                          severe systemic disease that is a constant threat  ?                          to life. After reviewing the risks  and benefits,  ?                          the patient was deemed in satisfactory condition to  ?                          undergo the procedure. ?                          After obtaining informed consent, the endoscope was  ?                          passed under direct vision. Throughout the  ?                          procedure, the patient's blood pressure, pulse, and  ?                          oxygen saturations were monitored continuously. The  ?                          [id number]-HQ190L ([id number]) Olympus colonoscope was  ?                          introduced through the mouth and advanced to the  ?                          proximal jejunum. The small bowel enteroscopy was  ?                          somewhat difficult due to coughing and chronic  ?                          respiratory status. The patient tolerated the  ?                          procedure well. ?Scope In: ?Scope Out: ?Findings: ?     There was no evidence of significant pathology in the proximal jejunum.  ?     (scope not likely passed more than 20cm past ligament of Treitz due to  ?     scope looping in the stomach) ?     Multiple small  mucosal nodules were found in the entire examined  ?     duodenum. ?     A foreign body (clip placed during Nov 2022 SBE) was found in the  ?     second/third portion of the duodenum, well adhered to the mucosa. No  ?     AVMs were seen to the extent of scope insertion. No fresh or old blood  ?     seen. ?Impression:               - The examined portion of the jejunum was normal. ?                          - Mucosal duodenal nodule(s). ?                          - Duodenal foreign body. ?                          - No specimens collected. ?Recommendation:           - Return patient to hospital ward for ongoing care. ?                          - Resume regular diet. ?                          - CBC this evening and tomorrow AM ?                          Observe in hospital for any passage of melena that  ?                           might occur. ?                          Dr. Benson Norway to assme consult care tomorrow and  ?                          determine need for and timing/location of small  ?                          bowel capsule study. ?Procedure Code(s):        --- Professional --- ?                          [phone], Small intestinal endoscopy, enteroscopy  ?                          beyond second portion of duodenum, not including  ?                          ileum; diagnostic, including collection of  ?                          specimen(s) by brushing or washing, when performed  ?                          (  separate procedure) ?Diagnosis Code(s):        --- Professional --- ?                          K31.89, Other diseases of stomach and duodenum ?                          T18.3XXS, Foreign body in small intestine, sequela ?                          D50.0, Iron deficiency anemia secondary to blood  ?                          loss (chronic) ?                          K92.1, Melena (includes Hematochezia) ?                          K55.20, Angiodysplasia of colon without hemorrhage ?CPT copyright 2019 American Medical Association. All rights reserved. ?The codes documented in this report are preliminary and upon coder review may  ?be revised to meet current compliance requirements. ?Poonam Woehrle L. Loletha Carrow, MD ?09/23/2021 10:04:54 AM ?This report has been signed electronically. ?Number of Addenda: 0 ?

## 2021-09-24 ENCOUNTER — Encounter (HOSPITAL_COMMUNITY)
Admission: EM | Disposition: A | Discharge status: Home / Self Care | Payer: Self-pay | Source: Home / Self Care | Attending: Internal Medicine

## 2021-09-24 ENCOUNTER — Encounter (HOSPITAL_COMMUNITY): Payer: Self-pay | Admitting: Internal Medicine

## 2021-09-24 DIAGNOSIS — D75839 Thrombocytosis, unspecified: Secondary | ICD-10-CM | POA: Diagnosis present

## 2021-09-24 DIAGNOSIS — K31819 Angiodysplasia of stomach and duodenum without bleeding: Secondary | ICD-10-CM | POA: Diagnosis not present

## 2021-09-24 DIAGNOSIS — I2510 Atherosclerotic heart disease of native coronary artery without angina pectoris: Secondary | ICD-10-CM | POA: Diagnosis present

## 2021-09-24 DIAGNOSIS — K921 Melena: Secondary | ICD-10-CM | POA: Diagnosis not present

## 2021-09-24 DIAGNOSIS — G4733 Obstructive sleep apnea (adult) (pediatric): Secondary | ICD-10-CM | POA: Diagnosis present

## 2021-09-24 DIAGNOSIS — D509 Iron deficiency anemia, unspecified: Secondary | ICD-10-CM | POA: Diagnosis not present

## 2021-09-24 DIAGNOSIS — I10 Essential (primary) hypertension: Secondary | ICD-10-CM | POA: Diagnosis present

## 2021-09-24 DIAGNOSIS — D62 Acute posthemorrhagic anemia: Secondary | ICD-10-CM | POA: Diagnosis present

## 2021-09-24 DIAGNOSIS — Z9981 Dependence on supplemental oxygen: Secondary | ICD-10-CM | POA: Diagnosis not present

## 2021-09-24 DIAGNOSIS — E669 Obesity, unspecified: Secondary | ICD-10-CM | POA: Diagnosis present

## 2021-09-24 DIAGNOSIS — F32A Depression, unspecified: Secondary | ICD-10-CM | POA: Diagnosis present

## 2021-09-24 DIAGNOSIS — K219 Gastro-esophageal reflux disease without esophagitis: Secondary | ICD-10-CM | POA: Diagnosis present

## 2021-09-24 DIAGNOSIS — Z96652 Presence of left artificial knee joint: Secondary | ICD-10-CM | POA: Diagnosis present

## 2021-09-24 DIAGNOSIS — D473 Essential (hemorrhagic) thrombocythemia: Secondary | ICD-10-CM | POA: Diagnosis not present

## 2021-09-24 DIAGNOSIS — Z87891 Personal history of nicotine dependence: Secondary | ICD-10-CM | POA: Diagnosis not present

## 2021-09-24 DIAGNOSIS — F419 Anxiety disorder, unspecified: Secondary | ICD-10-CM | POA: Diagnosis present

## 2021-09-24 DIAGNOSIS — Z7982 Long term (current) use of aspirin: Secondary | ICD-10-CM | POA: Diagnosis not present

## 2021-09-24 DIAGNOSIS — J9611 Chronic respiratory failure with hypoxia: Secondary | ICD-10-CM | POA: Diagnosis present

## 2021-09-24 DIAGNOSIS — U071 COVID-19: Secondary | ICD-10-CM | POA: Diagnosis present

## 2021-09-24 DIAGNOSIS — E785 Hyperlipidemia, unspecified: Secondary | ICD-10-CM | POA: Diagnosis present

## 2021-09-24 DIAGNOSIS — R011 Cardiac murmur, unspecified: Secondary | ICD-10-CM | POA: Diagnosis present

## 2021-09-24 DIAGNOSIS — J449 Chronic obstructive pulmonary disease, unspecified: Secondary | ICD-10-CM | POA: Diagnosis present

## 2021-09-24 DIAGNOSIS — E1151 Type 2 diabetes mellitus with diabetic peripheral angiopathy without gangrene: Secondary | ICD-10-CM | POA: Diagnosis present

## 2021-09-24 DIAGNOSIS — K922 Gastrointestinal hemorrhage, unspecified: Secondary | ICD-10-CM | POA: Diagnosis not present

## 2021-09-24 DIAGNOSIS — E876 Hypokalemia: Secondary | ICD-10-CM | POA: Diagnosis not present

## 2021-09-24 DIAGNOSIS — K319 Disease of stomach and duodenum, unspecified: Secondary | ICD-10-CM | POA: Diagnosis present

## 2021-09-24 DIAGNOSIS — K5521 Angiodysplasia of colon with hemorrhage: Secondary | ICD-10-CM | POA: Diagnosis present

## 2021-09-24 DIAGNOSIS — J9811 Atelectasis: Secondary | ICD-10-CM | POA: Diagnosis present

## 2021-09-24 HISTORY — PX: GIVENS CAPSULE STUDY: SHX5432

## 2021-09-24 LAB — COMPREHENSIVE METABOLIC PANEL
ALT: 11 U/L (ref 0–44)
AST: 13 U/L — ABNORMAL LOW (ref 15–41)
Albumin: 2.6 g/dL — ABNORMAL LOW (ref 3.5–5.0)
Alkaline Phosphatase: 54 U/L (ref 38–126)
Anion gap: 6 (ref 5–15)
BUN: 13 mg/dL (ref 8–23)
CO2: 29 mmol/L (ref 22–32)
Calcium: 8.7 mg/dL — ABNORMAL LOW (ref 8.9–10.3)
Chloride: 105 mmol/L (ref 98–111)
Creatinine, Ser: 0.81 mg/dL (ref 0.44–1.00)
GFR, Estimated: >60 mL/min (ref >60)
Glucose, Bld: 111 mg/dL — ABNORMAL HIGH (ref 70–99)
Potassium: 3.9 mmol/L (ref 3.5–5.1)
Sodium: 140 mmol/L (ref 135–145)
Total Bilirubin: 0.2 mg/dL — ABNORMAL LOW (ref 0.3–1.2)
Total Protein: 6 g/dL — ABNORMAL LOW (ref 6.5–8.1)

## 2021-09-24 LAB — GLUCOSE, CAPILLARY
Glucose-Capillary: 98 mg/dL (ref 70–99)
Glucose-Capillary: 111 mg/dL — ABNORMAL HIGH (ref 70–99)
Glucose-Capillary: 78 mg/dL (ref 70–99)
Glucose-Capillary: 81 mg/dL (ref 70–99)
Glucose-Capillary: 124 mg/dL — ABNORMAL HIGH (ref 70–99)

## 2021-09-24 LAB — CBC WITH DIFFERENTIAL/PLATELET
Abs Immature Granulocytes: 0.05 10*3/uL (ref 0.00–0.07)
Basophils Absolute: 0 10*3/uL (ref 0.0–0.1)
Basophils Relative: 0 %
Eosinophils Absolute: 0.2 10*3/uL (ref 0.0–0.5)
Eosinophils Relative: 2 %
HCT: 26.8 % — ABNORMAL LOW (ref 36.0–46.0)
Hemoglobin: 7.8 g/dL — ABNORMAL LOW (ref 12.0–15.0)
Immature Granulocytes: 1 %
Lymphocytes Relative: 13 %
Lymphs Abs: 1.3 10*3/uL (ref 0.7–4.0)
MCH: 24.3 pg — ABNORMAL LOW (ref 26.0–34.0)
MCHC: 29.1 g/dL — ABNORMAL LOW (ref 30.0–36.0)
MCV: 83.5 fL (ref 80.0–100.0)
Monocytes Absolute: 1 10*3/uL (ref 0.1–1.0)
Monocytes Relative: 9 %
Neutro Abs: 7.9 10*3/uL — ABNORMAL HIGH (ref 1.7–7.7)
Neutrophils Relative %: 75 %
Platelets: 751 10*3/uL — ABNORMAL HIGH (ref 150–400)
RBC: 3.21 MIL/uL — ABNORMAL LOW (ref 3.87–5.11)
RDW: 22.4 % — ABNORMAL HIGH (ref 11.5–15.5)
WBC: 10.5 10*3/uL (ref 4.0–10.5)
nRBC: 0 % (ref 0.0–0.2)

## 2021-09-24 LAB — MAGNESIUM: Magnesium: 1.6 mg/dL — ABNORMAL LOW (ref 1.7–2.4)

## 2021-09-24 LAB — C-REACTIVE PROTEIN: CRP: 0.8 mg/dL (ref <1.0)

## 2021-09-24 SURGERY — IMAGING PROCEDURE, GI TRACT, INTRALUMINAL, VIA CAPSULE

## 2021-09-24 MED ORDER — IPRATROPIUM-ALBUTEROL 0.5-2.5 (3) MG/3ML IN SOLN: 3.0000 mL | Freq: Two times a day (BID) | RESPIRATORY_TRACT | Status: DC

## 2021-09-24 MED ORDER — POTASSIUM CHLORIDE CRYS ER 20 MEQ PO TBCR
40.0000 meq | EXTENDED_RELEASE_TABLET | Freq: Once | ORAL | Status: AC
  Administered 2021-09-24: 40 meq via ORAL
  Filled 2021-09-24: qty 2

## 2021-09-24 MED ORDER — IPRATROPIUM-ALBUTEROL 0.5-2.5 (3) MG/3ML IN SOLN
3.0000 mL | Freq: Two times a day (BID) | RESPIRATORY_TRACT | Status: DC
  Administered 2021-09-24 – 2021-09-25 (×2): 3 mL via RESPIRATORY_TRACT
  Filled 2021-09-24 (×2): qty 3

## 2021-09-24 MED ORDER — MAGNESIUM SULFATE 4 GM/100ML IV SOLN
4.0000 g | Freq: Once | INTRAVENOUS | Status: AC
  Administered 2021-09-24: 4 g via INTRAVENOUS
  Filled 2021-09-24: qty 100

## 2021-09-24 SURGICAL SUPPLY — 1 items: TOWEL COTTON PACK 4EA (MISCELLANEOUS) ×4 IMPLANT

## 2021-09-24 NOTE — Progress Notes (Addendum)
Subjective: ?Kassey Laforest is a 71 year old black female with multiple medical problems including chronic anemia with chronic thrombocytosis, anxiety and depression, arthritis, COPD. NIDDM, GERD Hypertension, Hyperlipidemia, peripheral vascular disease, OSA, obesity, chronic hypoxia with respiratory failure on 2 L of home oxygen who gives a 2-week history of melenic stools she was found to be occult positive in the ER was given 2 units of packed red blood cells. Due to her  comorbidities a a capsule study was advised and the capsule has been administered this morning.  Patient cannot remember why she had a bowel movement today but she remembers having black stools yesterday. She is also on iron supplements which can cause the stool to turn black. Small bowel enteroscopy done on 09/23/2021 showed normal-appearing jejunum with the hemostat clips in place from previous application at Q0-G8 junction. ? ?Objective: ?Vital signs in last 24 hours: ?Temp:  [97.8 ?F (36.6 ?C)-98.4 ?F (36.9 ?C)] 97.8 ?F (36.6 ?C) (04/03 1212) ?Pulse Rate:  [66-76] 71 (04/03 1212) ?Resp:  [19-20] 20 (04/03 1212) ?BP: (124-147)/(58-85) 147/60 (04/03 1212) ?SpO2:  [94 %-100 %] 96 % (04/03 1436) ?Last BM Date : 09/24/21 ? ?Intake/Output from previous day: ?04/02 0701 - 04/03 0700 ?In: 340 [P.O.:240; I.V.:100] ?Out: 600 [Urine:600] ?Intake/Output this shift: ?Total I/O ?In: 64 [P.O.:60] ?Out: -  ? ?General appearance: alert, cooperative, appears stated age, fatigued, no distress, and morbidly obese ?Resp: clear to auscultation bilaterally ?Cardio: regular rate and rhythm, S1, S2 normal, no murmur, click, rub or gallop ?GI: soft, non-tender; bowel sounds normal; no masses,  no organomegaly ? ?Lab Results: ?Recent Labs  ?  09/23/21 ?0145 09/23/21 ?1750 09/24/21 ?0108  ?WBC 9.1 9.4 10.5  ?HGB 7.6* 7.7* 7.8*  ?HCT 26.8* 27.4* 26.8*  ?PLT 744* 781* 751*  ? ?BMET ?Recent Labs  ?  09/21/21 ?1500 09/23/21 ?0145 09/24/21 ?0108  ?NA 141 139 140  ?K 3.7 3.9  3.9  ?CL 108 106 105  ?CO2 27 29 29   ?GLUCOSE 124* 114* 111*  ?BUN 10 11 13   ?CREATININE 0.82 0.76 0.81  ?CALCIUM 8.8* 8.6* 8.7*  ? ?LFT ?Recent Labs  ?  09/24/21 ?0108  ?PROT 6.0*  ?ALBUMIN 2.6*  ?AST 13*  ?ALT 11  ?ALKPHOS 54  ?BILITOT 0.2*  ? ?PT/INR ?Recent Labs  ?  09/21/21 ?1816  ?LABPROT 14.5  ?INR 1.1  ? ?Studies/Results: ?No results found. ? ?Medications: I have reviewed the patient's current medications. ?Prior to Admission:  ?Medications Prior to Admission  ?Medication Sig Dispense Refill Last Dose  ? albuterol (PROVENTIL HFA;VENTOLIN HFA) 108 (90 Base) MCG/ACT inhaler Inhale 1-2 puffs into the lungs every 6 (six) hours as needed for wheezing or shortness of breath.   Past Month  ? ASPIRIN LOW DOSE 81 MG EC tablet TAKE 1 TABLET(81 MG) BY MOUTH DAILY (Patient taking differently: Take 81 mg by mouth daily.) 30 tablet 0 09/20/2021  ? atorvastatin (LIPITOR) 80 MG tablet Take 80 mg by mouth daily.   09/21/2021  ? cilostazol (PLETAL) 100 MG tablet Take 1 tablet (100 mg total) by mouth 2 (two) times daily. 180 tablet 1 09/21/2021  ? doxycycline (VIBRAMYCIN) 100 MG capsule Take 100 mg by mouth See admin instructions. Bid x 10 days   09/21/2021  ? ezetimibe (ZETIA) 10 MG tablet Take 1 tablet (10 mg total) by mouth daily. 90 tablet 0 09/21/2021  ? ferrous sulfate 325 (65 FE) MG tablet Take 325 mg by mouth daily.   09/21/2021  ? fluticasone (FLONASE) 50 MCG/ACT nasal  spray Place 1 spray into both nostrils daily as needed for allergies.   Past Month  ? gabapentin (NEURONTIN) 600 MG tablet Take 1 tablet (600 mg total) by mouth at bedtime. (Patient taking differently: Take 600 mg by mouth 2 (two) times daily.) 30 tablet 11 09/21/2021  ? ipratropium-albuterol (DUONEB) 0.5-2.5 (3) MG/3ML SOLN Take 3 mLs by nebulization 3 (three) times daily. 270 mL 0 09/21/2021  ? metFORMIN (GLUCOPHAGE) 500 MG tablet Take 0.5 tablets (250 mg total) by mouth 2 (two) times daily with a meal.   09/21/2021  ? MYRBETRIQ 25 MG TB24 tablet Take 25 mg by  mouth daily.   09/21/2021  ? nitroGLYCERIN (NITROSTAT) 0.4 MG SL tablet Place 1 tablet (0.4 mg total) under the tongue every 5 (five) minutes as needed for chest pain. 90 tablet 3 unk  ? OXYGEN Inhale 2 L into the lungs continuous.     ? pantoprazole (PROTONIX) 40 MG tablet Take 40 mg by mouth daily.   09/21/2021  ? spironolactone (ALDACTONE) 50 MG tablet TAKE 1 TABLET(50 MG) BY MOUTH DAILY (Patient taking differently: Take 50 mg by mouth daily.) 90 tablet 3 09/21/2021  ? acetaminophen (TYLENOL) 500 MG tablet Take 1,000 mg by mouth daily as needed for moderate pain.     ? amLODipine (NORVASC) 10 MG tablet Take 1 tablet (10 mg total) by mouth daily. (Patient not taking: Reported on 09/21/2021)   Not Taking  ? hydrALAZINE (APRESOLINE) 100 MG tablet TAKE 1 TABLET BY MOUTH THREE TIMES DAILY (Patient not taking: Reported on 09/21/2021) 270 tablet 1 Not Taking  ? losartan (COZAAR) 100 MG tablet Take 100 mg by mouth daily. (Patient not taking: Reported on 09/21/2021)   Not Taking  ? ?Scheduled: ? atorvastatin  80 mg Oral Daily  ? ezetimibe  10 mg Oral Daily  ? ferrous sulfate  325 mg Oral Daily  ? insulin aspart  0-9 Units Subcutaneous Q4H  ? [START ON 09/25/2021] ipratropium-albuterol  3 mL Nebulization BID  ? lisinopril  5 mg Oral Daily  ? metoprolol tartrate  50 mg Oral BID  ? pantoprazole (PROTONIX) IV  40 mg Intravenous Q12H  ? spironolactone  50 mg Oral Daily  ? ?Continuous: ? ?Assessment/Plan: ?1) GI bleed with anemia and melena-VCE in progress. ?2) Chronic thrombocytosis. ?3) COPD-on 2 L of oxygen.Marland Kitchen ?4) CAD/HTN/NIDDM/PVD.  ? LOS: 0 days  ? ?Cornisha Zetino ?09/24/2021, 2:45 PM ? ? ?

## 2021-09-24 NOTE — Progress Notes (Signed)
[facility] Video Capsule Endoscopy ordered by MD Benson Norway. Pt ingested capsule at 1001. ? ?Per [facility] capsule instructions, pt to remain NPO until 1201 at which time she may progress to clear liquids. Pt may have a small snack at 1401. Pt may return to ordered diet at 1801. ? ?The study will conclude at 2201 at which time the recorder and belt can be removed. Endoscopy staff will pick up the recorder and belt in the AM. ? ?[facility] Capsule instructions provided to pt. Pt expressed understanding. ? ?Please reach out to endo at [phone] with any questions or concerns before 1700. After 1700, please call the hospital operator and ask for the endoscopy nurse on call. ? ?Debarah Crape ?09/24/21 ?10:10 AM ? ?

## 2021-09-24 NOTE — Progress Notes (Signed)
?  Transition of Care (TOC) Screening Note ? ? ?Patient Details  ?Name: [name] ?Date of Birth: 09/16/1950 ? ? ?Transition of Care (TOC) CM/SW Contact:    ?Cyndi Bender, RN ?Phone Number: ?09/24/2021, 9:34 AM ? ? ? ?Transition of Care Department [facility]) has reviewed patient and no TOC needs have been identified at this time. We will continue to monitor patient advancement through interdisciplinary progression rounds. If new patient transition needs arise, please place a TOC consult. ? ? ?

## 2021-09-24 NOTE — Progress Notes (Addendum)
?                                  PROGRESS NOTE                                             ?                                                                                                                     ?                                         ? ? Patient Demographics:  ? ? [name], is a 71 y.o. female, DOB - May 22, 1951, TGG:[id number] ? ?Outpatient Primary MD for the patient is Osei-Bonsu, Iona Beard, MD    LOS - 0  Admit date - 09/21/2021   ? ?Chief Complaint  ?Patient presents with  ? Anemia  ?    ? ?Brief Narrative (HPI from H&P)   71 y.o. female with medical history significant of chronic anemia, chronic thrombocytosis, anxiety, depression, arthritis, COPD, chronic hypoxic respiratory failure on 2 L home oxygen, CAD, non-insulin-dependent type 2 diabetes, GERD, hypertension, hyperlipidemia, PVD status post PV angiogram with revascularization of the SFA, sleep apnea, obesity, history of prior GI bleed presents to the ED for evaluation of dark stools, in the ER work-up was positive for severe anemia along with heme positive stools and she was admitted for further work-up ? ? Subjective:  ? ?Patient in bed, appears comfortable, denies any headache, no fever, no chest pain or pressure, no shortness of breath , no abdominal pain. No new focal weakness.  Still has some black stools but likely old blood. ? ? Assessment  & Plan :  ? ?Acute upper GI bleed - Symptomatic acute blood loss anemia with history of small bowel AVMs -  Patient is presenting with a 2-week history of melena.  She takes aspirin 81 mg daily.  Occult positive, so far has received 2 units of packed RBC transfusion on 09/22/2021, IV PPI to be continued, GI on board she is s/p small bowel enteroscopy on 09/23/2021 which did not show any active bleed, small bowel capsule endoscopy started on 09/24/2021, continue to monitor H&H, slight downtrend but overall looks stable continue to monitor. ? ?Thrombocytosis - Possibly  due to history of cigarette smoking.  Patient reports past history of heavy cigarette smoking, quit now. Platelet count 795k, chronically elevated and was 522k on labs done 3 weeks ago.  EP to continue to monitor if needed outpatient hematology follow-up. ?  ?Chest pain - Likely due to anemia.  High-sensitivity troponin negative.  EKG unremarkable this 7am, could be from gastritis or esophagitis, PPI along  with single dose Maalox.  Monitor. ?  ?Coronary artery disease - Hold aspirin, zoom statin and placed on beta-blocker for secondary prevention. ? ?COPD (chronic obstructive pulmonary disease) (HCC) Chronic hypoxic respiratory failure  - Stable, not wheezing.  Satting well on 2 L home oxygen.  Home inhalers continued. ?  ?PVD (peripheral vascular disease) (HCC)  - Hold aspirin and statin for secondary prevention. ? ?HTN (hypertension) - Stable. Start beta-blocker.  Half home dose hydralazine later today and low-dose ACE inhibitor from tomorrow if blood pressure tolerates. ? ?Hypokalemia and hypomagnesemia.  Replaced.   ? ?Non-insulin dependent type 2 diabetes mellitus (HCC) -  Check A1c, Sliding scale insulin sensitive every 4 hours. ? ?Lab Results  ?Component Value Date  ? HGBA1C 4.9 09/21/2021  ? ? ?CBG (last 3)  ?Recent Labs  ?  09/23/21 ?2351 09/24/21 ?9528 09/24/21 ?4132  ?GLUCAP 120* 98 111*  ? ? ?Incidental Covid +ve - testing in the ER - precautions.  CT count is 28.  Stable CRP and no pulmonary symptoms to speak of after transfusion. ? ? ?   ? ?Condition -  Guarded ? ?Family Communication  :  daughter Rutherford Nail [phone] on 09/22/2021 ? ?Code Status :  Full ? ?Consults  :  GI ? ?PUD Prophylaxis : PPI ? ? Procedures  :    ? ?Small bowel enteroscopy. 09/23/21 ? ?Impression:               - The examined portion of the jejunum was normal. ?                          - Mucosal duodenal nodule(s). ?                          - Duodenal foreign body. ?                          - No specimens  collected. ?Recommendation:           - Return patient to hospital ward for ongoing care. ?                          - Resume regular diet. ?                          - CBC this evening and tomorrow AM ?                          Observe in hospital for any passage of melena that  ?                          might occur. ?                          Dr. Benson Norway to assme consult care tomorrow and  ?                          determine need for and timing/location of small  ?                          bowel capsule study. ? ?   ? ?  Disposition Plan  :   ? ?Status is: Observation ? ?DVT Prophylaxis  :   ? ?SCDs Start: 09/21/21 1947 ? ?  ? ?Lab Results  ?Component Value Date  ? PLT 751 (H) 09/24/2021  ? ? ?Diet :  ?Diet Order   ? ?       ?  Diet NPO time specified Except for: Sips with Meds  Diet effective now       ?  ? ?  ?  ? ?  ?  ? ?Inpatient Medications ? ?Scheduled Meds: ? atorvastatin  80 mg Oral Daily  ? ezetimibe  10 mg Oral Daily  ? ferrous sulfate  325 mg Oral Daily  ? insulin aspart  0-9 Units Subcutaneous Q4H  ? ipratropium-albuterol  3 mL Nebulization TID  ? lisinopril  5 mg Oral Daily  ? metoprolol tartrate  50 mg Oral BID  ? pantoprazole (PROTONIX) IV  40 mg Intravenous Q12H  ? spironolactone  50 mg Oral Daily  ? ?Continuous Infusions: ? magnesium sulfate bolus IVPB 4 g (09/24/21 0831)  ? ?PRN Meds:.acetaminophen **OR** acetaminophen, albuterol, metoprolol tartrate ? ?Antibiotics  :   ? ?Anti-infectives (From admission, onward)  ? ? None  ? ?  ? ? ? Time Spent in minutes  30 ? ? ?Lala Lund M.D on 09/24/2021 at 10:21 AM ? ?To page go to [email or web address]  ? ?[facility] -  Office  [phone] ? ?See all Orders from today for further details ? ? ? Objective:  ? ?Vitals:  ? 09/23/21 2350 09/24/21 0025 09/24/21 0350 09/24/21 0901  ?BP: 125/71 127/68 (!) 124/58   ?Pulse: 76 73 66   ?Resp: 19  19   ?Temp: 98.2 ?F (36.8 ?C)  98.4 ?F (36.9 ?C)   ?TempSrc: Oral  Oral   ?SpO2: 94%  100% 100%  ?Weight:      ?Height:       ? ? ?Wt Readings from Last 3 Encounters:  ?09/23/21 92 kg  ?09/10/21 92.1 kg  ?08/23/21 95.7 kg  ? ? ? ?Intake/Output Summary (Last 24 hours) at 09/24/2021 1021 ?Last data filed at 09/24/2021 3151 ?Gross per 24 hour  ?Intake 300 ml  ?Output 600 ml  ?Net -300 ml  ? ? ? ?Physical Exam ? ?Awake Alert, No new F.N deficits, Normal affect ?[place].AT,PERRAL ?Supple Neck, No JVD,   ?Symmetrical Chest wall movement, Good air movement bilaterally, CTAB ?RRR,No Gallops, Rubs or new Murmurs,  ?+ve B.Sounds, Abd Soft, No tenderness,   ?No Cyanosis, Clubbing or edema  ? ? ? Data Review:  ? ? ?CBC ?Recent Labs  ?Lab 09/22/21 ?1326 09/22/21 ?1744 09/23/21 ?0145 09/23/21 ?1750 09/24/21 ?0108  ?WBC 9.2 8.9 9.1 9.4 10.5  ?HGB 8.0* 7.9* 7.6* 7.7* 7.8*  ?HCT 27.3* 27.4* 26.8* 27.4* 26.8*  ?PLT 762* 747* 744* 781* 751*  ?MCV 81.5 81.5 82.7 83.8 83.5  ?MCH 23.9* 23.5* 23.5* 23.5* 24.3*  ?MCHC 29.3* 28.8* 28.4* 28.1* 29.1*  ?RDW 21.9* 22.1* 22.2* 22.2* 22.4*  ?LYMPHSABS  --   --  0.9  --  1.3  ?MONOABS  --   --  0.4  --  1.0  ?EOSABS  --   --  0.5  --  0.2  ?BASOSABS  --   --  0.0  --  0.0  ? ? ?Electrolytes ?Recent Labs  ?Lab 09/21/21 ?1500 09/21/21 ?1816 09/23/21 ?0145 09/23/21 ?7616 09/24/21 ?0737  ?NA 141  --  139  --  140  ?K 3.7  --  3.9  --  3.9  ?CL 108  --  106  --  105  ?CO2 27  --  29  --  29  ?GLUCOSE 124*  --  114*  --  111*  ?BUN 10  --  11  --  13  ?CREATININE 0.82  --  0.76  --  0.81  ?CALCIUM 8.8*  --  8.6*  --  8.7*  ?AST 13*  --  16  --  13*  ?ALT 14  --  11  --  11  ?ALKPHOS 46  --  55  --  54  ?BILITOT 0.6  --  <0.1*  --  0.2*  ?ALBUMIN 2.8*  --  2.7*  --  2.6*  ?MG  --   --  1.7  --  1.6*  ?CRP  --   --   --  0.6 0.8  ?INR  --  1.1  --   --   --   ?HGBA1C 4.9  --   --   --   --   ? ? ?------------------------------------------------------------------------------------------------------------------ ?No results for input(s): CHOL, HDL, LDLCALC, TRIG, CHOLHDL, LDLDIRECT in the last 72 hours. ? ?Lab Results  ?Component Value  Date  ? HGBA1C 4.9 09/21/2021  ? ? ?No results for input(s): TSH, T4TOTAL, T3FREE, THYROIDAB in the last 72 hours. ? ?Invalid input(s): FREET3 ?---------------------------------------------------------------------------------

## 2021-09-24 NOTE — Plan of Care (Signed)

## 2021-09-25 ENCOUNTER — Encounter (HOSPITAL_COMMUNITY): Payer: Self-pay | Admitting: Gastroenterology

## 2021-09-25 DIAGNOSIS — K922 Gastrointestinal hemorrhage, unspecified: Secondary | ICD-10-CM | POA: Diagnosis not present

## 2021-09-25 LAB — CBC WITH DIFFERENTIAL/PLATELET
Abs Immature Granulocytes: 0.02 10*3/uL (ref 0.00–0.07)
Basophils Absolute: 0 10*3/uL (ref 0.0–0.1)
Basophils Relative: 1 %
Eosinophils Absolute: 0.3 10*3/uL (ref 0.0–0.5)
Eosinophils Relative: 3 %
HCT: 26.7 % — ABNORMAL LOW (ref 36.0–46.0)
Hemoglobin: 7.7 g/dL — ABNORMAL LOW (ref 12.0–15.0)
Immature Granulocytes: 0 %
Lymphocytes Relative: 15 %
Lymphs Abs: 1.3 10*3/uL (ref 0.7–4.0)
MCH: 24.1 pg — ABNORMAL LOW (ref 26.0–34.0)
MCHC: 28.8 g/dL — ABNORMAL LOW (ref 30.0–36.0)
MCV: 83.4 fL (ref 80.0–100.0)
Monocytes Absolute: 0.9 10*3/uL (ref 0.1–1.0)
Monocytes Relative: 11 %
Neutro Abs: 6.1 10*3/uL (ref 1.7–7.7)
Neutrophils Relative %: 70 %
Platelets: 726 10*3/uL — ABNORMAL HIGH (ref 150–400)
RBC: 3.2 MIL/uL — ABNORMAL LOW (ref 3.87–5.11)
RDW: 22.1 % — ABNORMAL HIGH (ref 11.5–15.5)
WBC: 8.6 10*3/uL (ref 4.0–10.5)
nRBC: 0.2 % (ref 0.0–0.2)

## 2021-09-25 LAB — COMPREHENSIVE METABOLIC PANEL
ALT: 10 U/L (ref 0–44)
AST: 11 U/L — ABNORMAL LOW (ref 15–41)
Albumin: 2.7 g/dL — ABNORMAL LOW (ref 3.5–5.0)
Alkaline Phosphatase: 57 U/L (ref 38–126)
Anion gap: 5 (ref 5–15)
BUN: 12 mg/dL (ref 8–23)
CO2: 28 mmol/L (ref 22–32)
Calcium: 8.4 mg/dL — ABNORMAL LOW (ref 8.9–10.3)
Chloride: 108 mmol/L (ref 98–111)
Creatinine, Ser: 0.71 mg/dL (ref 0.44–1.00)
GFR, Estimated: >60 mL/min (ref >60)
Glucose, Bld: 110 mg/dL — ABNORMAL HIGH (ref 70–99)
Potassium: 4.4 mmol/L (ref 3.5–5.1)
Sodium: 141 mmol/L (ref 135–145)
Total Bilirubin: <0.1 mg/dL — ABNORMAL LOW (ref 0.3–1.2)
Total Protein: 6 g/dL — ABNORMAL LOW (ref 6.5–8.1)

## 2021-09-25 LAB — MAGNESIUM: Magnesium: 1.9 mg/dL (ref 1.7–2.4)

## 2021-09-25 LAB — GLUCOSE, CAPILLARY
Glucose-Capillary: 103 mg/dL — ABNORMAL HIGH (ref 70–99)
Glucose-Capillary: 103 mg/dL — ABNORMAL HIGH (ref 70–99)
Glucose-Capillary: 107 mg/dL — ABNORMAL HIGH (ref 70–99)
Glucose-Capillary: 116 mg/dL — ABNORMAL HIGH (ref 70–99)
Glucose-Capillary: 129 mg/dL — ABNORMAL HIGH (ref 70–99)

## 2021-09-25 LAB — C-REACTIVE PROTEIN: CRP: 0.9 mg/dL (ref <1.0)

## 2021-09-25 MED ORDER — PANTOPRAZOLE SODIUM 40 MG PO TBEC: 40.0000 mg | DELAYED_RELEASE_TABLET | Freq: Two times a day (BID) | ORAL | 0 refills | Status: AC

## 2021-09-25 MED ORDER — SODIUM CHLORIDE 0.9 % IV SOLN: INTRAVENOUS | Status: DC

## 2021-09-25 MED ORDER — AMLODIPINE BESYLATE 10 MG PO TABS: 10.0000 mg | ORAL_TABLET | Freq: Every day | ORAL | 11 refills | Status: DC

## 2021-09-25 MED ORDER — METOPROLOL TARTRATE 50 MG PO TABS: 50.0000 mg | ORAL_TABLET | Freq: Two times a day (BID) | ORAL | 2 refills | Status: DC

## 2021-09-25 MED ORDER — PANTOPRAZOLE SODIUM 40 MG PO TBEC: 40.0000 mg | DELAYED_RELEASE_TABLET | Freq: Every day | ORAL | 0 refills | Status: DC

## 2021-09-25 NOTE — Progress Notes (Signed)
Pt and pt belongings transported to entrance A via wheelchair. AVS paperwork and education given. IV removed. ?

## 2021-09-25 NOTE — Discharge Summary (Addendum)
?                                                                                ? ?[name] JGG:[id number] DOB: [birth year] DOA: 09/21/2021 ? ?PCP: Benito Mccreedy, MD ? ?Admit date: 09/21/2021  Discharge date: 09/25/2021 ? ?Admitted From: Home   Disposition:  Home ? ? ?Recommendations for Outpatient Follow-up:  ? ?Follow up with PCP in 1-2 weeks ? ?PCP Please obtain BMP/CBC, 2 view CXR in 1week,  (see Discharge instructions)  ? ?PCP Please follow up on the following pending results: monitor CBC ? ? ?Home Health: None   ?Equipment/Devices: None  ?Consultations: GI ?Discharge Condition: Stable    ?CODE STATUS: Full    ?Diet Recommendation: Heart Healthy  ?  ? ?Chief Complaint  ?Patient presents with  ? Anemia  ?  ? ?Brief history of present illness from the day of admission and additional interim summary   ? ?71 y.o. female with medical history significant of chronic anemia, chronic thrombocytosis, anxiety, depression, arthritis, COPD, chronic hypoxic respiratory failure on 2 L home oxygen, CAD, non-insulin-dependent type 2 diabetes, GERD, hypertension, hyperlipidemia, PVD status post PV angiogram with revascularization of the SFA, sleep apnea, obesity, history of prior GI bleed presents to the ED for evaluation of dark stools, in the ER work-up was positive for severe anemia along with heme positive stools and she was admitted for further work-up ? ?                                                               Hospital Course  ? ? ?Acute upper GI bleed - Symptomatic acute blood loss anemia with history of small bowel AVMs -  Patient is presenting with a 2-week history of melena.  She takes aspirin 81 mg daily.  Occult positive, so far has received 2 units of packed RBC transfusion on 09/22/2021, IV PPI to be continued, GI on board she is s/p small bowel enteroscopy on 09/23/2021 which did not show any active bleed,  small bowel capsule endoscopy started on 09/24/2021 showed non bleeding AVMS, DW Dr Benson Norway DC home on ASA-Pletal, PPI, continue to monitor H&H by PCP. ?  ?Thrombocytosis - Possibly due to history of cigarette smoking.  Patient reports past history of heavy cigarette smoking, quit now. Platelet count 795k, chronically elevated and was 522k on labs done 3 weeks ago.  EP to continue to monitor if needed outpatient hematology follow-up. ?  ?Coronary artery disease - was on statin continue Aspirin and Pletal resumed with caution.   ?  ?COPD (chronic obstructive pulmonary disease) (HCC) Chronic hypoxic respiratory failure  - Stable, not wheezing.  Satting well on 2 L home oxygen.  Home inhalers continued. ?  ?PVD (peripheral vascular disease) ([place])  - on aspirin and statin for secondary prevention at home.  Aspirin was held here . ?  ?HTN (hypertension) - Stable. Placed on Norvasc. ?  ?Hypokalemia and hypomagnesemia.  Replaced.   ?  ?  Incidental Covid +ve - testing in the ER - precautions.  CT count is 28.  Stable CRP and no pulmonary symptoms to speak of after transfusion. ?  ?Non-insulin dependent type 2 diabetes mellitus ([place]) -continue home regimen. ? ?Lab Results  ?Component Value Date  ? HGBA1C 4.9 09/21/2021  ? ? ?Discharge diagnosis   ? ? ?Principal Problem: ?  Acute upper GI bleed ?Active Problems: ?  Chest pain ?  Thrombocytosis ?  HTN (hypertension) ?  PVD (peripheral vascular disease) ([place]) ?  COPD (chronic obstructive pulmonary disease) ([place]) ?  Coronary artery disease ?  Non-insulin dependent type 2 diabetes mellitus ([place]) ?  Melena ?  Anemia due to chronic blood loss ?  AVM (arteriovenous malformation) of small bowel, acquired with hemorrhage ? ? ? ?Discharge instructions   ? ?Discharge Instructions   ? ? Diet - low sodium heart healthy   Complete by: As directed ?  ? Discharge instructions   Complete by: As directed ?  ? Follow with Primary MD Benito Mccreedy, MD and your gastroenterologist in 7 days  ? ?Get  CBC, CMP, 2 view Chest X ray -  checked next visit within 1 week by Primary MD   ? ?Activity: As tolerated with Full fall precautions use walker/cane & assistance as needed ? ?Disposition Home   ? ?Diet: Heart Healthy   ? ?Special Instructions: If you have smoked or chewed Tobacco  in the last 2 yrs please stop smoking, stop any regular Alcohol  and or any Recreational drug use. ? ?On your next visit with your primary care physician please Get Medicines reviewed and adjusted. ? ?Please request your Prim.MD to go over all Hospital Tests and Procedure/Radiological results at the follow up, please get all Hospital records sent to your Prim MD by signing hospital release before you go home. ? ?If you experience worsening of your admission symptoms, develop shortness of breath, life threatening emergency, suicidal or homicidal thoughts you must seek medical attention immediately by calling 911 or calling your MD immediately  if symptoms less severe. ? ?You Must read complete instructions/literature along with all the possible adverse reactions/side effects for all the Medicines you take and that have been prescribed to you. Take any new Medicines after you have completely understood and accpet all the possible adverse reactions/side effects.  ? Increase activity slowly   Complete by: As directed ?  ? ?  ? ? ?Discharge Medications  ? ?Allergies as of 09/25/2021   ?No Known Allergies ?  ? ?  ?Medication List  ?  ? ?STOP taking these medications   ? ?hydrALAZINE 100 MG tablet ?Commonly known as: APRESOLINE ?  ?losartan 100 MG tablet ?Commonly known as: COZAAR ?  ? ?  ? ?TAKE these medications   ? ?acetaminophen 500 MG tablet ?Commonly known as: TYLENOL ?Take 1,000 mg by mouth daily as needed for moderate pain. ?  ?albuterol 108 (90 Base) MCG/ACT inhaler ?Commonly known as: VENTOLIN HFA ?Inhale 1-2 puffs into the lungs every 6 (six) hours as needed for wheezing or shortness of breath. ?  ?amLODipine 10 MG tablet ?Commonly  known as: NORVASC ?Take 1 tablet (10 mg total) by mouth daily. ?  ?Aspirin Low Dose 81 MG EC tablet ?Generic drug: aspirin ?TAKE 1 TABLET(81 MG) BY MOUTH DAILY ?What changed: See the new instructions. ?  ?atorvastatin 80 MG tablet ?Commonly known as: LIPITOR ?Take 80 mg by mouth daily. ?  ?cilostazol 100 MG tablet ?Commonly known as: PLETAL ?Take  1 tablet (100 mg total) by mouth 2 (two) times daily. ?  ?doxycycline 100 MG capsule ?Commonly known as: VIBRAMYCIN ?Take 100 mg by mouth See admin instructions. Bid x 10 days ?  ?ezetimibe 10 MG tablet ?Commonly known as: Zetia ?Take 1 tablet (10 mg total) by mouth daily. ?  ?ferrous sulfate 325 (65 FE) MG tablet ?Take 325 mg by mouth daily. ?  ?fluticasone 50 MCG/ACT nasal spray ?Commonly known as: FLONASE ?Place 1 spray into both nostrils daily as needed for allergies. ?  ?gabapentin 600 MG tablet ?Commonly known as: NEURONTIN ?Take 1 tablet (600 mg total) by mouth at bedtime. ?What changed: when to take this ?  ?ipratropium-albuterol 0.5-2.5 (3) MG/3ML Soln ?Commonly known as: DUONEB ?Take 3 mLs by nebulization 3 (three) times daily. ?  ?metFORMIN 500 MG tablet ?Commonly known as: GLUCOPHAGE ?Take 0.5 tablets (250 mg total) by mouth 2 (two) times daily with a meal. ?  ?Myrbetriq 25 MG Tb24 tablet ?Generic drug: mirabegron ER ?Take 25 mg by mouth daily. ?  ?nitroGLYCERIN 0.4 MG SL tablet ?Commonly known as: NITROSTAT ?Place 1 tablet (0.4 mg total) under the tongue every 5 (five) minutes as needed for chest pain. ?  ?OXYGEN ?Inhale 2 L into the lungs continuous. ?  ?pantoprazole 40 MG tablet ?Commonly known as: PROTONIX ?Take 1 tablet (40 mg total) by mouth 2 (two) times daily. ?What changed: when to take this ?  ?spironolactone 50 MG tablet ?Commonly known as: ALDACTONE ?TAKE 1 TABLET(50 MG) BY MOUTH DAILY ?What changed: See the new instructions. ?  ? ?  ? ? ? Follow-up Information   ? ? Benito Mccreedy, MD. Schedule an appointment as soon as possible for a visit in 1  week(s).   ?Specialty: Internal Medicine ?Why: Also follow-up with your gastroenterologist within a week of discharge. ?Contact information: ?[address] ?[address] ?[place] [address] ?[phone]

## 2021-09-25 NOTE — Discharge Instructions (Addendum)
Follow with Primary MD Benito Mccreedy, MD and your gastroenterologist in 7 days  ? ?Get CBC, CMP, 2 view Chest X ray -  checked next visit within 1 week by Primary MD   ? ?Activity: As tolerated with Full fall precautions use walker/cane & assistance as needed ? ?Disposition Home   ? ?Diet: Heart Healthy   ? ?Special Instructions: If you have smoked or chewed Tobacco  in the last 2 yrs please stop smoking, stop any regular Alcohol  and or any Recreational drug use. ? ?On your next visit with your primary care physician please Get Medicines reviewed and adjusted. ? ?Please request your Prim.MD to go over all Hospital Tests and Procedure/Radiological results at the follow up, please get all Hospital records sent to your Prim MD by signing hospital release before you go home. ? ?If you experience worsening of your admission symptoms, develop shortness of breath, life threatening emergency, suicidal or homicidal thoughts you must seek medical attention immediately by calling 911 or calling your MD immediately  if symptoms less severe. ? ?You Must read complete instructions/literature along with all the possible adverse reactions/side effects for all the Medicines you take and that have been prescribed to you. Take any new Medicines after you have completely understood and accpet all the possible adverse reactions/side effects.  ? ?  ?

## 2021-09-26 ENCOUNTER — Other Ambulatory Visit: Payer: Medicare Other

## 2021-09-27 DIAGNOSIS — D500 Iron deficiency anemia secondary to blood loss (chronic): Secondary | ICD-10-CM | POA: Diagnosis not present

## 2021-09-27 DIAGNOSIS — E876 Hypokalemia: Secondary | ICD-10-CM | POA: Diagnosis not present

## 2021-09-28 ENCOUNTER — Ambulatory Visit: Payer: Medicare Other | Admitting: Cardiology

## 2021-09-28 ENCOUNTER — Other Ambulatory Visit: Payer: Self-pay | Admitting: *Deleted

## 2021-09-28 NOTE — Patient Outreach (Signed)
?[facility]) Care Management ?Telephonic RN Care Manager Note ? ? ?09/28/2021 ?Name:  [name] MRN:  [id number] DOB:  [birth year] ? ?Summary: ?Recent hospitalization for GI bleed being followed by GI w/ an pending appointment on 4/12. Pt recovering well with no additional acute symptoms. COPD remains in the GREEN zone with no acute issues.  ? ?Recommendations/Changes made from today's visit: ?Offered to review d/c instructions however pt opt to decline and fully aware of all instructions with the support from her daughter. No other issues to address at this time.  ? ?Subjective: ?[name] is an 71 y.o. year old female who is a primary patient of Osei-Bonsu, Iona Beard, MD. The care management team was consulted for assistance with care management and/or care coordination needs.   ? ?Telephonic RN Care Manager completed Telephone Visit today. ? ?Objective:  ? ?Medications Reviewed Today   ? ? Reviewed by Debarah Crape, RN (Registered Nurse) on 09/24/21 at [phone]  Med List Status: Complete  ? ?Medication Order Taking? Sig Documenting Provider Last Dose Status Informant  ?acetaminophen (TYLENOL) 500 MG tablet [id number]  Take 1,000 mg by mouth daily as needed for moderate pain. [provider]  Active Self  ?albuterol (PROVENTIL HFA;VENTOLIN HFA) 108 (90 Base) MCG/ACT inhaler [id number] Yes Inhale 1-2 puffs into the lungs every 6 (six) hours as needed for wheezing or shortness of breath. [provider] Past Month Active Self  ?amLODipine (NORVASC) 10 MG tablet [id number] No Take 1 tablet (10 mg total) by mouth daily.  ?Patient not taking: Reported on 09/21/2021  ? Charlynne Cousins, MD Not Taking Active Self  ?ASPIRIN LOW DOSE 81 MG EC tablet [id number] Yes TAKE 1 TABLET(81 MG) BY MOUTH DAILY  ?Patient taking differently: Take 81 mg by mouth daily.  ? Rosalin Hawking 09/20/2021 Active Self  ?atorvastatin (LIPITOR) 80 MG tablet [id number] Yes Take 80 mg by  mouth daily. [provider] 09/21/2021 Active Self  ?cilostazol (PLETAL) 100 MG tablet [id number] Yes Take 1 tablet (100 mg total) by mouth 2 (two) times daily. Rex Kras, DO 09/21/2021 Active Self  ?doxycycline (VIBRAMYCIN) 100 MG capsule [id number] Yes Take 100 mg by mouth See admin instructions. Bid x 10 days [provider] 09/21/2021 Active Self  ?         ?Med Note [place] Crane, MISTY D   Fri Sep 21, 2021  9:11 PM) Course began 09/21/21 pt had 1 dose  ?ezetimibe (ZETIA) 10 MG tablet [id number] Yes Take 1 tablet (10 mg total) by mouth daily. Rex Kras, DO 09/21/2021 Active Self  ?ferrous sulfate 325 (65 FE) MG tablet [id number] Yes Take 325 mg by mouth daily. [provider] 09/21/2021 Active Self  ?fluticasone (FLONASE) 50 MCG/ACT nasal spray [id number] Yes Place 1 spray into both nostrils daily as needed for allergies. [provider] Past Month Active Self  ?gabapentin (NEURONTIN) 600 MG tablet [id number] Yes Take 1 tablet (600 mg total) by mouth at bedtime.  ?Patient taking differently: Take 600 mg by mouth 2 (two) times daily.  ? [place], [place] T, [place] 09/21/2021 Active Self  ?hydrALAZINE (APRESOLINE) 100 MG tablet [id number] No TAKE 1 TABLET BY MOUTH THREE TIMES DAILY  ?Patient not taking: Reported on 09/21/2021  ? Adrian Prows, MD Not Taking Active Self  ?ipratropium-albuterol (DUONEB) 0.5-2.5 (3) MG/3ML SOLN [id number] Yes Take 3 mLs by nebulization 3 (three) times daily. Donne Hazel, MD 09/21/2021 Active Self  ?losartan (COZAAR) 100 MG tablet [id number] No Take 100  mg by mouth daily.  ?Patient not taking: Reported on 09/21/2021  ? [provider] Not Taking Active Self  ?metFORMIN (GLUCOPHAGE) 500 MG tablet [id number] Yes Take 0.5 tablets (250 mg total) by mouth 2 (two) times daily with a meal. Adrian Prows, MD 09/21/2021 Active Self  ?MYRBETRIQ 25 MG TB24 tablet [id number] Yes Take 25 mg by mouth daily. [provider] 09/21/2021 Active Self  ?nitroGLYCERIN (NITROSTAT) 0.4 MG  SL tablet [id number] Yes Place 1 tablet (0.4 mg total) under the tongue every 5 (five) minutes as needed for chest pain. Miquel Dunn, NP unk Expired 09/21/21 2359 Self  ?         ?Med Note (WHITE, Wynn Maudlin Aug 23, 2021 10:33 PM)    ?OXYGEN [id number] Yes Inhale 2 L into the lungs continuous. [provider]  Active Self  ?pantoprazole (PROTONIX) 40 MG tablet [id number] Yes Take 40 mg by mouth daily. [provider] 09/21/2021 Active Self  ?spironolactone (ALDACTONE) 50 MG tablet [id number] Yes TAKE 1 TABLET(50 MG) BY MOUTH DAILY  ?Patient taking differently: Take 50 mg by mouth daily.  ? Rex Kras, DO 09/21/2021 Active Self  ? ?  ?  ? ?  ? ? ? ?SDOH:  (Social Determinants of Health) assessments and interventions performed:  ? ? ? ?Care Plan ? ?Review of patient past medical history, allergies, medications, health status, including review of consultants reports, laboratory and other test data, was performed as part of comprehensive evaluation for care management services.  ? ?Care Plan : RN Care Manager Plan of Care  ?Updates made by Tobi Bastos, RN since 09/28/2021 12:00 AM  ?  ? ?Problem: Knowledg Deficit related to COPD management and care coordination needs.   ?Priority: High  ?  ? ?Long-Range Goal: Development of plan of care for manament of COPD   ?Start Date: 08/31/2021  ?Expected End Date: 02/21/2022  ?This Visit's Progress: On track  ?Recent Progress: On track  ?Priority: High  ?Note:   ?Current Barriers:  ?Knowledge Deficits related to plan of care for management of COPD  ? ?RNCM Clinical Goal(s):  ?Patient will verbalize understanding of plan for management of COPD as evidenced by self report and chart review  through collaboration with RN Care manager, provider, and care team.  ? ?Interventions: ?Inter-disciplinary care team collaboration (see longitudinal plan of care) ?Evaluation of current treatment plan related to  self management and patient's adherence to plan as  established by provider ? ? ?COPD Interventions:  (Status:  New goal.) Long Term Goal ?Provided patient with basic written and verbal COPD education on self care/management/and exacerbation prevention ?Advised patient to track and manage COPD triggers ?Provided written and verbal instructions on pursed lip breathing and utilized returned demonstration as teach back ?Provided instruction about proper use of medications used for management of COPD including inhalers ?Advised patient to self assesses COPD action plan zone and make appointment with provider if in the yellow zone for 48 hours without improvement ?Provided education about and advised patient to utilize infection prevention strategies to reduce risk of respiratory infection ?Discussed the importance of adequate rest and management of fatigue with COPD ?Screening for signs and symptoms of depression related to chronic disease state  ?Assessed social determinant of health barriers ? ?TOC week #3-3/15: Pt continues to do well however "alittle under the weather" today but remains in the GREEN zone with her ongoing COPD and management of care. Continue to have support from her daughter. No  acute issues or related symptoms with exacerbation related to her COPD. Pt remains aware to follow up with provider with acute needs in the YELLOW zone. Will follow up next week with ongoing transition of care call next week. ?TOC week #4-3/28: Readmit on 3/22 "coughing up blood". Pt denies any additional symptoms however continue to utilize her ongoing COPD treatment and home O2 with some sputum that is clear, no additional bleeding. Pt feeling better and verifies she is taking all her prescribed medications with no acute issues and sufficient transportation to all medical appointments. Spoke with pt today and who inquired on received DME for a pure-wick and hospital bed. This has been discussed in the pass and pt  has spoken with her provider and started this process  however DME agency having issues with getting pt's insurance to cover this request. Pt aware to call her insurance customer services for assist with DME coverage. Pt called [facility] hotline today assuming this is so

## 2021-10-01 ENCOUNTER — Other Ambulatory Visit: Payer: Self-pay | Admitting: *Deleted

## 2021-10-02 NOTE — Patient Outreach (Signed)
[facility]) Care Management ? ?10/01/2021 ? ?[name] ?13-Nov-1950 ?[id number] ? ? ?EMMI-GENERAL DISCHARGE-RESOLVED ?RED ON EMMI ALERT ?Day # 4 ?Date: 09/30/2021 ?Red Alert Reason:Lost of interest. ? ?RN spoke with pt today concerning the above emmi. Pt states she is fine and "just answered the question" but denies any depression or suicidal thoughts. Pt declined any counseling and does not feel she needs interventions with consultation or medication via her provider. Pt states she is "fine" and her daughter has provided her with some form of entertainment with a garden that she can tend to. She is excited about utilizing this garden and will focus on that to improve her level of interest. ? ?No interventions at this time however RN encouraged to participate in some form of activity to assist with her current state of mind. No other issues mentioned to address at this time. ? ?[name], RN ?Care Management Coordinator ?[place] ?Main Office [phone]  ? ?

## 2021-10-03 DIAGNOSIS — K31819 Angiodysplasia of stomach and duodenum without bleeding: Secondary | ICD-10-CM | POA: Diagnosis not present

## 2021-10-03 DIAGNOSIS — D509 Iron deficiency anemia, unspecified: Secondary | ICD-10-CM | POA: Diagnosis not present

## 2021-10-03 DIAGNOSIS — R609 Edema, unspecified: Secondary | ICD-10-CM | POA: Diagnosis not present

## 2021-10-05 ENCOUNTER — Other Ambulatory Visit: Payer: Self-pay | Admitting: *Deleted

## 2021-10-05 NOTE — Patient Outreach (Signed)
[facility]) Care Management Telephonic RN Care Manager Note   10/05/2021 Name:  [name] MRN:  [id number] DOB:  July 21, 1950  Summary: Pt continue to do well with no acute needs. COPD in the GREEN with ongoing use of her prescribed medications. Currently awaiting refills on nebulizer medications via local pharmacy.   Recommendations/Changes made from today's visit: Will scheduled accordingly to pt's request for monthly follow up calls and continue to encouraged adherence with the current plan of care discussed today. Will continue to encouraged pt to contact her provider with any abnormal symptoms of COPD when in the YELLOW zone for possible intervention.  Subjective: [name] is an 71 y.o. year old female who is a primary patient of Osei-Bonsu, Greggory Stallion, MD. The care management team was consulted for assistance with care management and/or care coordination needs.    Telephonic RN Care Manager completed Telephone Visit today.  Objective:   Medications Reviewed Today     Reviewed by Eulas Post, RN (Registered Nurse) on 09/24/21 at [phone]  Med List Status: Complete   Medication Order Taking? Sig Documenting Provider Last Dose Status Informant  acetaminophen (TYLENOL) 500 MG tablet [id number]  Take 1,000 mg by mouth daily as needed for moderate pain. [provider]  Active Self  albuterol (PROVENTIL HFA;VENTOLIN HFA) 108 (90 Base) MCG/ACT inhaler [id number] Yes Inhale 1-2 puffs into the lungs every 6 (six) hours as needed for wheezing or shortness of breath. [provider] Past Month Active Self  amLODipine (NORVASC) 10 MG tablet [id number] No Take 1 tablet (10 mg total) by mouth daily.  Patient not taking: Reported on 09/21/2021   Marinda Elk, MD Not Taking Active Self  ASPIRIN LOW DOSE 81 MG EC tablet [id number] Yes TAKE 1 TABLET(81 MG) BY MOUTH DAILY  Patient taking differently: Take 81 mg by mouth daily.    Domingo Dimes 09/20/2021 Active Self  atorvastatin (LIPITOR) 80 MG tablet [id number] Yes Take 80 mg by mouth daily. [provider] 09/21/2021 Active Self  cilostazol (PLETAL) 100 MG tablet [id number] Yes Take 1 tablet (100 mg total) by mouth 2 (two) times daily. Tessa Lerner, DO 09/21/2021 Active Self  doxycycline (VIBRAMYCIN) 100 MG capsule [id number] Yes Take 100 mg by mouth See admin instructions. Bid x 10 days [provider] 09/21/2021 Active Self           Med Note Margo Aye, MISTY D   Fri Sep 21, 2021  9:11 PM) Course began 09/21/21 pt had 1 dose  ezetimibe (ZETIA) 10 MG tablet [id number] Yes Take 1 tablet (10 mg total) by mouth daily. Tessa Lerner, DO 09/21/2021 Active Self  ferrous sulfate 325 (65 FE) MG tablet [id number] Yes Take 325 mg by mouth daily. [provider] 09/21/2021 Active Self  fluticasone (FLONASE) 50 MCG/ACT nasal spray [id number] Yes Place 1 spray into both nostrils daily as needed for allergies. [provider] Past Month Active Self  gabapentin (NEURONTIN) 600 MG tablet [id number] Yes Take 1 tablet (600 mg total) by mouth at bedtime.  Patient taking differently: Take 600 mg by mouth 2 (two) times daily.   [place], [place] T, [place] 09/21/2021 Active Self  hydrALAZINE (APRESOLINE) 100 MG tablet [id number] No TAKE 1 TABLET BY MOUTH THREE TIMES DAILY  Patient not taking: Reported on 09/21/2021   Yates Decamp, MD Not Taking Active Self  ipratropium-albuterol (DUONEB) 0.5-2.5 (3) MG/3ML SOLN [id number] Yes Take 3 mLs by nebulization 3 (three) times daily. Jerald Kief, MD 09/21/2021  Active Self  losartan (COZAAR) 100 MG tablet [id number] No Take 100 mg by mouth daily.  Patient not taking: Reported on 09/21/2021   [provider] Not Taking Active Self  metFORMIN (GLUCOPHAGE) 500 MG tablet [id number] Yes Take 0.5 tablets (250 mg total) by mouth 2 (two) times daily with a meal. Yates Decamp, MD 09/21/2021 Active Self  MYRBETRIQ 25 MG TB24 tablet [id number]  Yes Take 25 mg by mouth daily. [provider] 09/21/2021 Active Self  nitroGLYCERIN (NITROSTAT) 0.4 MG SL tablet [id number] Yes Place 1 tablet (0.4 mg total) under the tongue every 5 (five) minutes as needed for chest pain. Toniann Fail, NP unk Expired 09/21/21 2359 Self           Med Note (WHITE, Karlene Lineman Aug 23, 2021 10:33 PM)    OXYGEN [id number] Yes Inhale 2 L into the lungs continuous. [provider]  Active Self  pantoprazole (PROTONIX) 40 MG tablet [id number] Yes Take 40 mg by mouth daily. [provider] 09/21/2021 Active Self  spironolactone (ALDACTONE) 50 MG tablet [id number] Yes TAKE 1 TABLET(50 MG) BY MOUTH DAILY  Patient taking differently: Take 50 mg by mouth daily.   Tessa Lerner, DO 09/21/2021 Active Self             SDOH:  (Social Determinants of Health) assessments and interventions performed:     Care Plan  Review of patient past medical history, allergies, medications, health status, including review of consultants reports, laboratory and other test data, was performed as part of comprehensive evaluation for care management services.   Care Plan : RN Care Manager Plan of Care  Updates made by Alejandro Mulling, RN since 10/05/2021 12:00 AM     Problem: Knowledg Deficit related to COPD management and care coordination needs.   Priority: High     Long-Range Goal: Development of plan of care for manament of COPD   Start Date: 08/31/2021  Expected End Date: 02/21/2022  This Visit's Progress: On track  Recent Progress: On track  Priority: High  Note:   Current Barriers:  Knowledge Deficits related to plan of care for management of COPD   RNCM Clinical Goal(s):  Patient will verbalize understanding of plan for management of COPD as evidenced by self report and chart review  through collaboration with RN Care manager, provider, and care team.   Interventions: Inter-disciplinary care team collaboration (see longitudinal plan of  care) Evaluation of current treatment plan related to  self management and patient's adherence to plan as established by provider   COPD Interventions:  (Status:  New goal.) Long Term Goal Provided patient with basic written and verbal COPD education on self care/management/and exacerbation prevention Advised patient to track and manage COPD triggers Provided written and verbal instructions on pursed lip breathing and utilized returned demonstration as teach back Provided instruction about proper use of medications used for management of COPD including inhalers Advised patient to self assesses COPD action plan zone and make appointment with provider if in the yellow zone for 48 hours without improvement Provided education about and advised patient to utilize infection prevention strategies to reduce risk of respiratory infection Discussed the importance of adequate rest and management of fatigue with COPD Screening for signs and symptoms of depression related to chronic disease state  Assessed social determinant of health barriers  TOC week #3-3/15: Pt continues to do well however "alittle under the weather" today but remains in the GREEN zone with her ongoing COPD  and management of care. Continue to have support from her daughter. No acute issues or related symptoms with exacerbation related to her COPD. Pt remains aware to follow up with provider with acute needs in the YELLOW zone. Will follow up next week with ongoing transition of care call next week. TOC week #4-3/28: Readmit on 3/22 "coughing up blood". Pt denies any additional symptoms however continue to utilize her ongoing COPD treatment and home O2 with some sputum that is clear, no additional bleeding. Pt feeling better and verifies she is taking all her prescribed medications with no acute issues and sufficient transportation to all medical appointments. Spoke with pt today and who inquired on received DME for a pure-wick and hospital  bed. This has been discussed in the pass and pt  has spoken with her provider and started this process however DME agency having issues with getting pt's insurance to cover this request. Pt aware to call her insurance customer services for assist with DME coverage. Pt called [facility] hotline today assuming this is something that we can approved. Pt educated on services once again and the use of the [facility] RN hotline, daughter also updated as they were not aware. No other issues reported. RN reiterated on the plan of care and answer all inquires and questions. Strongly encourage adherence with pt's following the plan of care. Will follow up next week with any acute needs and continue to address issues accordingly.  TOC last hospital 3/31-4/4 for Acute upper GI bleed: RN spoke with pt today and received an update. Pt continues to recover well with no signs of bleeding however pt reports unable to find the source of the bleed. Pt states her breathing is much improved with her ongoing home O2 2 liters. Pt remains in the GREEN zone with no symptoms at this time. Pt remains aware on who to call with any symptoms of bleed and currently pending an appointment with GI on 4/12 to follow up on her bleeding. Denies any dizziness or related symptoms of lost of blood and aware to use caution with all activities until the source is found. Pt continue to have a supportive daughter to assist with any acute issues.  10/05/2021 Update: Pt continues to do well with no acute issues or encounters. Pt continue to remains in the GREEN zone with her COPD. Denies any symptoms of coughing up blood as experienced last month. Currently awaiting pharmacy to receive orders for her ongoing nebulizer medications. Primary care visit end of April. Reports CBG for  her diabetes remain stable with baseline readings of 135 this AM fasting. Denies any current issues or needs at this time. Will review the current plan of care and intervention accordingly. Pt  prefers monthly follow up call moving forward and feel she does not need a weekly contact.  Patient Goals/Self-Care Activities: Take all medications as prescribed Attend all scheduled provider appointments Call pharmacy for medication refills 3-7 days in advance of running out of medications Attend church or other social activities Perform all self care activities independently  Perform IADL's (shopping, preparing meals, housekeeping, managing finances) independently Call provider office for new concerns or questions  eliminate smoking in my home identify and avoid work-related triggers identify and remove indoor air pollutants limit outdoor activity during cold weather develop a rescue plan eliminate symptom triggers at home follow rescue plan if symptoms flare-up keep follow-up appointments: Reminder to contact provider on Monday to schedule an appointment for post-op hospitalization. don't eat or exercise right before  bedtime use devices that will help like a cane, sock-puller or reacher do breathing exercises every day  Follow Up Plan:  Telephone follow up appointment with care management team member scheduled for:  May 2023 The patient has been provided with contact information for the care management team and has been advised to call with any health related questions or concerns.        Elliot Cousin, RN Care Management Coordinator [facility] Main Office [phone]

## 2021-10-15 ENCOUNTER — Other Ambulatory Visit: Payer: Self-pay | Admitting: Cardiology

## 2021-10-15 DIAGNOSIS — I739 Peripheral vascular disease, unspecified: Secondary | ICD-10-CM

## 2021-10-15 DIAGNOSIS — I2510 Atherosclerotic heart disease of native coronary artery without angina pectoris: Secondary | ICD-10-CM

## 2021-10-15 DIAGNOSIS — I6523 Occlusion and stenosis of bilateral carotid arteries: Secondary | ICD-10-CM

## 2021-10-15 DIAGNOSIS — E119 Type 2 diabetes mellitus without complications: Secondary | ICD-10-CM

## 2021-10-22 ENCOUNTER — Ambulatory Visit (INDEPENDENT_AMBULATORY_CARE_PROVIDER_SITE_OTHER): Payer: Medicare Other | Admitting: Surgical

## 2021-10-22 DIAGNOSIS — J441 Chronic obstructive pulmonary disease with (acute) exacerbation: Secondary | ICD-10-CM | POA: Diagnosis not present

## 2021-10-22 DIAGNOSIS — J449 Chronic obstructive pulmonary disease, unspecified: Secondary | ICD-10-CM | POA: Diagnosis not present

## 2021-10-22 DIAGNOSIS — R600 Localized edema: Secondary | ICD-10-CM

## 2021-10-22 DIAGNOSIS — M545 Low back pain, unspecified: Secondary | ICD-10-CM | POA: Diagnosis not present

## 2021-10-22 DIAGNOSIS — D500 Iron deficiency anemia secondary to blood loss (chronic): Secondary | ICD-10-CM | POA: Diagnosis not present

## 2021-10-22 MED ORDER — METHOCARBAMOL 500 MG PO TABS: 500.0000 mg | ORAL_TABLET | Freq: Three times a day (TID) | ORAL | 0 refills | Status: DC | PRN

## 2021-10-23 ENCOUNTER — Telehealth: Payer: Self-pay | Admitting: Orthopedic Surgery

## 2021-10-23 NOTE — Telephone Encounter (Signed)
Patient came in yesterday and was told her prescription for her Lasix would be put in and it was not, that is needed and would like it to be sent to the preferred pharmacy. ?

## 2021-10-23 NOTE — Telephone Encounter (Signed)
Pt called and states that she was suppose to have lasix called in?  ? ?CB [phone]  ?

## 2021-10-23 NOTE — Telephone Encounter (Signed)
I sent in a prescription for a muscle relaxer and recommended she discuss the possibility of talking a diuretic with her PCP due to her moderate amount of edema in both legs

## 2021-10-23 NOTE — Telephone Encounter (Signed)
I told her to discuss with her PCP when she saw him later that day

## 2021-10-24 NOTE — Telephone Encounter (Signed)
Sounds good, if her daughter has questions she can call and we can discuss

## 2021-10-24 NOTE — Telephone Encounter (Signed)
Patient aware.

## 2021-10-28 ENCOUNTER — Encounter: Payer: Self-pay | Admitting: Orthopedic Surgery

## 2021-10-28 NOTE — Progress Notes (Signed)
? ?Office Visit Note ?  ?Patient: [name]           ?Date of Birth: 1951-03-20           ?MRN: [id number] ?Visit Date: 10/22/2021 ?Requested by: Benito Mccreedy, MD ?[address] ?[address] ?[place],  [place] [address] ?PCP: Benito Mccreedy, MD ? ?Subjective: ?Chief Complaint  ?Patient presents with  ? Other  ?  Bil leg pain along with swelling and discoloration   ? ? ?HPI: [name] is a 71 y.o. female who presents to the office complaining of bilateral leg swelling.  Patient has history of left total knee arthroplasty on 04/18/2020.  Left knee is doing very well and she has no complaints regarding her knee.  Her main complaint today is bilateral leg swelling in both calves.  She denies any new shortness of breath or chest pain.  She denies any history of CHF or any use of diuretics like furosemide.  She does have a history of COPD.  She had recent echocardiogram on 08/29/2021 that did not show any significantly compelling evidence of heart failure.  She has compression socks but does not use them because they are too hard to get on.  She has seen her PCP who recommended she be more active and walk but she was not pleased with this advice.  She returns to see her PCP this afternoon.Marland Kitchen   ?             ?ROS: All systems reviewed are negative as they relate to the chief complaint within the history of present illness.  Patient denies fevers or chills. ? ?Assessment & Plan: ?Visit Diagnoses:  ?1. Bilateral edema of lower extremity   ? ? ?Plan: Patient is a 71 year old female who presents for evaluation of bilateral lower extremity edematous swelling.  Does not seem related to her total knee arthroplasty that was done about a year and a half ago.  There is no asymmetry to the amount of swelling she has.  She has pitting edema in both lower extremities.  She has had this for a while and has seen her PCP who recommended walking program.  She also has compression socks but these are too hard to  get on.  Gave her prescription for compression socks to help with the swelling with the prescription for the lowest compression in order to help her get the socks on.  She will discuss potential other solutions to the edema with her PCP this afternoon. ? ?Follow-Up Instructions: No follow-ups on file.  ? ?Orders:  ?No orders of the defined types were placed in this encounter. ? ?Meds ordered this encounter  ?Medications  ? methocarbamol (ROBAXIN) 500 MG tablet  ?  Sig: Take 1 tablet (500 mg total) by mouth every 8 (eight) hours as needed for muscle spasms.  ?  Dispense:  30 tablet  ?  Refill:  0  ? ? ? ? Procedures: ?No procedures performed ? ? ?Clinical Data: ?No additional findings. ? ?Objective: ?Vital Signs: LMP  (LMP Unknown)  ? ?Physical Exam:  ?Constitutional: Patient appears well-developed ?HEENT:  ?Head: Normocephalic ?Eyes:EOM are normal ?Neck: Normal range of motion ?Cardiovascular: Normal rate ?Pulmonary/chest: Effort normal ?Neurologic: Patient is alert ?Skin: Skin is warm ?Psychiatric: Patient has normal mood and affect ? ?Ortho Exam: Ortho exam demonstrates left knee with well-healed incision from prior total knee arthroplasty.  No effusion noted.  No evidence of infection or dehiscence of the incision.  No asymmetric calf  tenderness.  She does have significant pitting edema of both lower extremities.  Intact ankle dorsiflexion, plantarflexion, inversion, eversion.  No bruising or ecchymosis noted.  No evidence of cellulitis with no erythema.  There is some slight discoloration of the left shin consistent with venous stasis ? ?Specialty Comments:  ?No specialty comments available. ? ?Imaging: ?No results found. ? ? ?PMFS History: ?Patient Active Problem List  ? Diagnosis Date Noted  ? Melena   ? Anemia due to chronic blood loss   ? AVM (arteriovenous malformation) of small bowel, acquired with hemorrhage   ? Acute upper GI bleed 09/21/2021  ? Chest pain 09/21/2021  ? Thrombocytosis 09/21/2021  ?  Non-insulin dependent type 2 diabetes mellitus ([place]) 09/21/2021  ? Class 3 obesity ([place]) 08/27/2021  ? Acute exacerbation of chronic obstructive pulmonary disease (COPD) ([place]) 08/23/2021  ? COPD exacerbation ([place]) 05/10/2021  ? GI bleeding 05/09/2021  ? HLD (hyperlipidemia)   ? COPD (chronic obstructive pulmonary disease) ([place])   ? Coronary artery disease   ? Osteoarthritis of left knee 04/18/2020  ? Tobacco use disorder 09/18/2018  ? Iron deficiency anemia 04/09/2018  ? Chronic anemia 12/26/2015  ? Hypokalemia 12/26/2015  ? PVD (peripheral vascular disease) ([place]) 10/05/2013  ? Discomfort in chest 05/11/2012  ? Nicotine dependence 05/11/2012  ? Bradycardia 05/11/2012  ? HTN (hypertension) 05/11/2012  ? Back pain 05/11/2012  ? ?Past Medical History:  ?Diagnosis Date  ? Anemia 12/26/2015  ? Anxiety   ? Arthritis   ? Blood transfusion without reported diagnosis   ? Chronic pain   ? resolved per pt 04/12/20  ? COPD (chronic obstructive pulmonary disease) ([place])   ? Coronary artery disease   ? Depression   ? Diabetes mellitus without complication ([place])   ? type 2  ? GERD (gastroesophageal reflux disease)   ? Heart murmur   ? since birth; Echo 02/18/19: LVEF 73%, grade 1 diastolic dysfunction, mild AS (mean grad 12 mmHg), trace MR/PR, mild TR, PASP 32 mmHg    ? Hyperlipemia   ? Hypertension   ? PVD (peripheral vascular disease) ([place])   ? right SFA stent 02/11/17 by Dr. Einar Gip  ? Sleep apnea   ? does not use cpap  ? Wears glasses   ?  ?Family History  ?Problem Relation Age of Onset  ? Diabetes Mother   ? Hypertension Mother   ? Heart disease Mother   ? Heart disease Father   ? Hypertension Father   ? Hypertension Sister   ? Hypertension Brother   ? Diabetes Brother   ? Hypertension Sister   ? Hypertension Brother   ? Diabetes Brother   ? Hypertension Brother   ? Hypertension Brother   ? Hypertension Brother   ?  ?Past Surgical History:  ?Procedure Laterality Date  ? ABDOMINAL HYSTERECTOMY    ? CARDIAC CATHETERIZATION    ?  CATARACT EXTRACTION    ? CESAREAN SECTION    ? COLONOSCOPY N/A 01/08/2013  ? Procedure: COLONOSCOPY;  Surgeon: Beryle Beams, MD;  Location: [facility] ENDOSCOPY;  Service: Endoscopy;  Laterality: N/A;  ? COLONOSCOPY N/A 12/28/2015  ? Procedure: COLONOSCOPY;  Surgeon: Carol Ada, MD;  Location: [facility] END[facility]COPY;  Service: Endoscopy;  Laterality: N/A;  ? COLONOSCOPY WITH PROPOFOL N/A 10/05/2020  ? Procedure: COLONOSCOPY WITH PROPOFOL;  Surgeon: Carol Ada, MD;  Location: [facility];  Service: Endoscopy;  Laterality: N/A;  ? ENTEROSCOPY N/A 12/28/2015  ? Procedure: ENTEROSCOPY;  Surgeon: Carol Ada, MD;  Location: [place];  Service: Endoscopy;  Laterality: N/A;  ? ENTEROSCOPY N/A 02/20/2018  ? Procedure: ENTEROSCOPY;  Surgeon: Carol Ada, MD;  Location: [facility];  Service: Endoscopy;  Laterality: N/A;  ? ENTEROSCOPY N/A 03/27/2018  ? Procedure: ENTEROSCOPY;  Surgeon: Carol Ada, MD;  Location: [facility];  Service: Endoscopy;  Laterality: N/A;  ? ENTEROSCOPY N/A 10/05/2020  ? Procedure: ENTEROSCOPY;  Surgeon: Carol Ada, MD;  Location: [facility];  Service: Endoscopy;  Laterality: N/A;  ? ENTEROSCOPY N/A 05/11/2021  ? Procedure: ENTEROSCOPY;  Surgeon: Carol Ada, MD;  Location: [facility];  Service: Endoscopy;  Laterality: N/A;  ? ENTEROSCOPY N/A 09/23/2021  ? Procedure: ENTEROSCOPY;  Surgeon: Doran Stabler, MD;  Location: [place];  Service: Gastroenterology;  Laterality: N/A;  ? ESOPHAGOGASTRODUODENOSCOPY N/A 01/08/2013  ? Procedure: ESOPHAGOGASTRODUODENOSCOPY (EGD);  Surgeon: Beryle Beams, MD;  Location: [facility];  Service: Endoscopy;  Laterality: N/A;  ? EYE SURGERY    ? GIVENS CAPSULE STUDY N/A 09/24/2021  ? Procedure: GIVENS CAPSULE STUDY;  Surgeon: Carol Ada, MD;  Location: [place];  Service: Gastroenterology;  Laterality: N/A;  ? HAND SURGERY    ? HEMOSTASIS CLIP PLACEMENT  05/11/2021  ? Procedure: HEMOSTASIS CLIP PLACEMENT;  Surgeon: Carol Ada, MD;  Location: [facility]  ENDOSCOPY;  Service: Endoscopy;;  ? HOT HEMOSTASIS N/A 12/28/2015  ? Procedure: HOT HEMOSTASIS (ARGON PLASMA COAGULATION/BICAP);  Surgeon: Carol Ada, MD;  Location: [facility] ENDOSCOPY;  Service: Endoscopy;  Laterality: N/A

## 2021-11-06 ENCOUNTER — Other Ambulatory Visit: Payer: Self-pay | Admitting: *Deleted

## 2021-11-06 NOTE — Patient Outreach (Signed)
[facility]) Care Management Telephonic RN Care Manager Note   11/06/2021 Name:  [name] MRN:  [id number] DOB:  [birth year]  Summary: Pt reports GREEN zone with her COPD however issues with fatigue and pt will follow up this week for possible intervention. Pt will have some one transport her to the office visit and pt currently able to afford her medications.  Recommendations/Changes made from today's visit: Encouraged pt to attend all scheduled medical office visits with her ongoing providers. Will follow up in a few weeks for possible interventions initiated by the provider. Will encouraged pt to continue using her prescribed medications to prevent acute symptoms from occurring.  Subjective: [name] is an 71 y.o. year old female who is a primary patient of Osei-Bonsu, Greggory Stallion, MD. The care management team was consulted for assistance with care management and/or care coordination needs.    Telephonic RN Care Manager completed Telephone Visit today.  Objective:   Medications Reviewed Today     Reviewed by Domingo Dimes (Physician Assistant Certified) on 10/28/21 at 1329  Med List Status: <None>   Medication Order Taking? Sig Documenting Provider Last Dose Status Informant  acetaminophen (TYLENOL) 500 MG tablet [id number]  Take 1,000 mg by mouth daily as needed for moderate pain. [provider]  Active Self  albuterol (PROVENTIL HFA;VENTOLIN HFA) 108 (90 Base) MCG/ACT inhaler [id number] No Inhale 1-2 puffs into the lungs every 6 (six) hours as needed for wheezing or shortness of breath. [provider] Past Month Active Self  amLODipine (NORVASC) 10 MG tablet [id number]  Take 1 tablet (10 mg total) by mouth daily. Leroy Sea, MD  Active   ASPIRIN LOW DOSE 81 MG EC tablet [id number] No TAKE 1 TABLET(81 MG) BY MOUTH DAILY  Patient taking differently: Take 81 mg by mouth daily.   Domingo Dimes 09/20/2021  Active Self  atorvastatin (LIPITOR) 80 MG tablet [id number] No Take 80 mg by mouth daily. [provider] 09/21/2021 Active Self  cilostazol (PLETAL) 100 MG tablet [id number] No Take 1 tablet (100 mg total) by mouth 2 (two) times daily. Tessa Lerner, DO 09/21/2021 Active Self  doxycycline (VIBRAMYCIN) 100 MG capsule [id number] No Take 100 mg by mouth See admin instructions. Bid x 10 days [provider] 09/21/2021 Active Self           Med Note Margo Aye, MISTY D   Fri Sep 21, 2021  9:11 PM) Course began 09/21/21 pt had 1 dose  ezetimibe (ZETIA) 10 MG tablet [id number]  TAKE 1 TABLET BY MOUTH DAILY [name], [name], DO  Active   ferrous sulfate 325 (65 FE) MG tablet [id number] No Take 325 mg by mouth daily. [provider] 09/21/2021 Active Self  fluticasone (FLONASE) 50 MCG/ACT nasal spray [id number] No Place 1 spray into both nostrils daily as needed for allergies. [provider] Past Month Active Self  gabapentin (NEURONTIN) 600 MG tablet [id number] No Take 1 tablet (600 mg total) by mouth at bedtime.  Patient taking differently: Take 600 mg by mouth 2 (two) times daily.   [place], [place] T, [place] 09/21/2021 Active Self  ipratropium-albuterol (DUONEB) 0.5-2.5 (3) MG/3ML SOLN [id number] No Take 3 mLs by nebulization 3 (three) times daily. Jerald Kief, MD 09/21/2021 Active Self  metFORMIN (GLUCOPHAGE) 500 MG tablet [id number] No Take 0.5 tablets (250 mg total) by mouth 2 (two) times daily with a meal. Yates Decamp, MD 09/21/2021 Active Self  methocarbamol (ROBAXIN) 500 MG tablet [id number] Yes  Take 1 tablet (500 mg total) by mouth every 8 (eight) hours as needed for muscle spasms. Domingo Dimes  Active   MYRBETRIQ 25 MG TB24 tablet [id number] No Take 25 mg by mouth daily. [provider] 09/21/2021 Active Self  nitroGLYCERIN (NITROSTAT) 0.4 MG SL tablet [id number] No Place 1 tablet (0.4 mg total) under the tongue every 5 (five) minutes as needed for chest pain. Toniann Fail, NP unk Expired 09/21/21 2359 Self           Med Note (WHITE, Karlene Lineman Aug 23, 2021 10:33 PM)    OXYGEN [id number]  Inhale 2 L into the lungs continuous. [provider]  Active Self  pantoprazole (PROTONIX) 40 MG tablet [id number]  Take 1 tablet (40 mg total) by mouth 2 (two) times daily. Leroy Sea, MD  Active   spironolactone (ALDACTONE) 50 MG tablet [id number] No TAKE 1 TABLET(50 MG) BY MOUTH DAILY  Patient taking differently: Take 50 mg by mouth daily.   Tessa Lerner, DO 09/21/2021 Active Self             SDOH:  (Social Determinants of Health) assessments and interventions performed:     Care Plan  Review of patient past medical history, allergies, medications, health status, including review of consultants reports, laboratory and other test data, was performed as part of comprehensive evaluation for care management services.   Care Plan : RN Care Manager Plan of Care  Updates made by Alejandro Mulling, RN since 11/06/2021 12:00 AM     Problem: Knowledg Deficit related to COPD management and care coordination needs.   Priority: High     Long-Range Goal: Development of plan of care for manament of COPD   Start Date: 08/31/2021  Expected End Date: 02/21/2022  This Visit's Progress: On track  Recent Progress: On track  Priority: High  Note:   Current Barriers:  Knowledge Deficits related to plan of care for management of COPD   RNCM Clinical Goal(s):  Patient will verbalize understanding of plan for management of COPD as evidenced by self report and chart review  through collaboration with RN Care manager, provider, and care team.   Interventions: Inter-disciplinary care team collaboration (see longitudinal plan of care) Evaluation of current treatment plan related to  self management and patient's adherence to plan as established by provider   COPD Interventions:  (Status:  New goal.) Long Term Goal Provided patient with basic written and  verbal COPD education on self care/management/and exacerbation prevention Advised patient to track and manage COPD triggers Provided written and verbal instructions on pursed lip breathing and utilized returned demonstration as teach back Provided instruction about proper use of medications used for management of COPD including inhalers Advised patient to self assesses COPD action plan zone and make appointment with provider if in the yellow zone for 48 hours without improvement Provided education about and advised patient to utilize infection prevention strategies to reduce risk of respiratory infection Discussed the importance of adequate rest and management of fatigue with COPD Screening for signs and symptoms of depression related to chronic disease state  Assessed social determinant of health barriers  TOC week #3-3/15: Pt continues to do well however "alittle under the weather" today but remains in the GREEN zone with her ongoing COPD and management of care. Continue to have support from her daughter. No acute issues or related symptoms with exacerbation related to her COPD. Pt remains aware to follow up with  provider with acute needs in the YELLOW zone. Will follow up next week with ongoing transition of care call next week. TOC week #4-3/28: Readmit on 3/22 "coughing up blood". Pt denies any additional symptoms however continue to utilize her ongoing COPD treatment and home O2 with some sputum that is clear, no additional bleeding. Pt feeling better and verifies she is taking all her prescribed medications with no acute issues and sufficient transportation to all medical appointments. Spoke with pt today and who inquired on received DME for a pure-wick and hospital bed. This has been discussed in the pass and pt  has spoken with her provider and started this process however DME agency having issues with getting pt's insurance to cover this request. Pt aware to call her insurance customer services  for assist with DME coverage. Pt called [facility] hotline today assuming this is something that we can approved. Pt educated on services once again and the use of the [facility] RN hotline, daughter also updated as they were not aware. No other issues reported. RN reiterated on the plan of care and answer all inquires and questions. Strongly encourage adherence with pt's following the plan of care. Will follow up next week with any acute needs and continue to address issues accordingly.  TOC last hospital 3/31-4/4 for Acute upper GI bleed: RN spoke with pt today and received an update. Pt continues to recover well with no signs of bleeding however pt reports unable to find the source of the bleed. Pt states her breathing is much improved with her ongoing home O2 2 liters. Pt remains in the GREEN zone with no symptoms at this time. Pt remains aware on who to call with any symptoms of bleed and currently pending an appointment with GI on 4/12 to follow up on her bleeding. Denies any dizziness or related symptoms of lost of blood and aware to use caution with all activities until the source is found. Pt continue to have a supportive daughter to assist with any acute issues.  10/05/2021 Update: Pt continues to do well with no acute issues or encounters. Pt continue to remains in the GREEN zone with her COPD. Denies any symptoms of coughing up blood as experienced last month. Currently awaiting pharmacy to receive orders for her ongoing nebulizer medications. Primary care visit end of April. Reports CBG for  her diabetes remain stable with baseline readings of 135 this AM fasting. Denies any current issues or needs at this time. Will review the current plan of care and intervention accordingly. Pt prefers monthly follow up call moving forward and feel she does not need a weekly contact.  5/16 Update:Pt reports no issues related to her breathing however issues with being very "fatigue" not related to her breathing but very tired  and weak. Pt has a follow up appointment with her provider and will address this issues and change. Pt verified she is in the GREEN zone with no acute needs or issues to address at this time. Verified adherence to all her medications and sufficient transportation to get to all her medical appointments. No other issues or needs presented as pt is managing her COPD with no reported issues.   Patient Goals/Self-Care Activities: Take all medications as prescribed Attend all scheduled provider appointments Call pharmacy for medication refills 3-7 days in advance of running out of medications Attend church or other social activities Perform all self care activities independently  Perform IADL's (shopping, preparing meals, housekeeping, managing finances) independently Call provider office for  new concerns or questions  eliminate smoking in my home identify and avoid work-related triggers identify and remove indoor air pollutants limit outdoor activity during cold weather develop a rescue plan eliminate symptom triggers at home follow rescue plan if symptoms flare-up keep follow-up appointments: Reminder to contact provider on Monday to schedule an appointment for post-op hospitalization. don't eat or exercise right before bedtime use devices that will help like a cane, sock-puller or reacher do breathing exercises every day  Follow Up Plan:  Telephone follow up appointment with care management team member scheduled for:  June 2023 The patient has been provided with contact information for the care management team and has been advised to call with any health related questions or concerns.        Elliot Cousin, RN Care Management Coordinator [facility] Main Office [phone]

## 2021-11-07 ENCOUNTER — Emergency Department (HOSPITAL_COMMUNITY): Payer: Medicare Other

## 2021-11-07 ENCOUNTER — Encounter (HOSPITAL_COMMUNITY): Payer: Self-pay | Admitting: Internal Medicine

## 2021-11-07 ENCOUNTER — Inpatient Hospital Stay (HOSPITAL_COMMUNITY)
Admission: EM | Admit: 2021-11-07 | Discharge: 2021-11-13 | DRG: 377 | Disposition: A | Discharge status: Home Health | Payer: Medicare Other | Attending: Internal Medicine | Admitting: Internal Medicine

## 2021-11-07 ENCOUNTER — Other Ambulatory Visit: Payer: Self-pay

## 2021-11-07 DIAGNOSIS — R0602 Shortness of breath: Secondary | ICD-10-CM | POA: Diagnosis not present

## 2021-11-07 DIAGNOSIS — F419 Anxiety disorder, unspecified: Secondary | ICD-10-CM | POA: Diagnosis present

## 2021-11-07 DIAGNOSIS — I10 Essential (primary) hypertension: Secondary | ICD-10-CM | POA: Diagnosis present

## 2021-11-07 DIAGNOSIS — J208 Acute bronchitis due to other specified organisms: Secondary | ICD-10-CM | POA: Diagnosis not present

## 2021-11-07 DIAGNOSIS — J9611 Chronic respiratory failure with hypoxia: Secondary | ICD-10-CM | POA: Diagnosis present

## 2021-11-07 DIAGNOSIS — I110 Hypertensive heart disease with heart failure: Secondary | ICD-10-CM | POA: Diagnosis not present

## 2021-11-07 DIAGNOSIS — Z90710 Acquired absence of both cervix and uterus: Secondary | ICD-10-CM

## 2021-11-07 DIAGNOSIS — Z7902 Long term (current) use of antithrombotics/antiplatelets: Secondary | ICD-10-CM

## 2021-11-07 DIAGNOSIS — Z9981 Dependence on supplemental oxygen: Secondary | ICD-10-CM | POA: Diagnosis not present

## 2021-11-07 DIAGNOSIS — E119 Type 2 diabetes mellitus without complications: Secondary | ICD-10-CM | POA: Diagnosis not present

## 2021-11-07 DIAGNOSIS — K219 Gastro-esophageal reflux disease without esophagitis: Secondary | ICD-10-CM | POA: Diagnosis present

## 2021-11-07 DIAGNOSIS — J449 Chronic obstructive pulmonary disease, unspecified: Secondary | ICD-10-CM | POA: Diagnosis present

## 2021-11-07 DIAGNOSIS — E1169 Type 2 diabetes mellitus with other specified complication: Secondary | ICD-10-CM | POA: Diagnosis not present

## 2021-11-07 DIAGNOSIS — Z20822 Contact with and (suspected) exposure to covid-19: Secondary | ICD-10-CM | POA: Diagnosis present

## 2021-11-07 DIAGNOSIS — I21A1 Myocardial infarction type 2: Secondary | ICD-10-CM | POA: Diagnosis present

## 2021-11-07 DIAGNOSIS — Z7982 Long term (current) use of aspirin: Secondary | ICD-10-CM

## 2021-11-07 DIAGNOSIS — R5381 Other malaise: Secondary | ICD-10-CM | POA: Diagnosis not present

## 2021-11-07 DIAGNOSIS — I501 Left ventricular failure: Secondary | ICD-10-CM | POA: Diagnosis present

## 2021-11-07 DIAGNOSIS — Z7984 Long term (current) use of oral hypoglycemic drugs: Secondary | ICD-10-CM | POA: Diagnosis not present

## 2021-11-07 DIAGNOSIS — F32A Depression, unspecified: Secondary | ICD-10-CM | POA: Diagnosis not present

## 2021-11-07 DIAGNOSIS — G4733 Obstructive sleep apnea (adult) (pediatric): Secondary | ICD-10-CM | POA: Diagnosis not present

## 2021-11-07 DIAGNOSIS — Z8249 Family history of ischemic heart disease and other diseases of the circulatory system: Secondary | ICD-10-CM

## 2021-11-07 DIAGNOSIS — E1151 Type 2 diabetes mellitus with diabetic peripheral angiopathy without gangrene: Secondary | ICD-10-CM | POA: Diagnosis present

## 2021-11-07 DIAGNOSIS — J441 Chronic obstructive pulmonary disease with (acute) exacerbation: Secondary | ICD-10-CM | POA: Diagnosis not present

## 2021-11-07 DIAGNOSIS — I2510 Atherosclerotic heart disease of native coronary artery without angina pectoris: Secondary | ICD-10-CM | POA: Diagnosis not present

## 2021-11-07 DIAGNOSIS — R7989 Other specified abnormal findings of blood chemistry: Secondary | ICD-10-CM | POA: Diagnosis present

## 2021-11-07 DIAGNOSIS — R778 Other specified abnormalities of plasma proteins: Secondary | ICD-10-CM | POA: Diagnosis present

## 2021-11-07 DIAGNOSIS — Z87891 Personal history of nicotine dependence: Secondary | ICD-10-CM

## 2021-11-07 DIAGNOSIS — J9621 Acute and chronic respiratory failure with hypoxia: Secondary | ICD-10-CM | POA: Diagnosis not present

## 2021-11-07 DIAGNOSIS — Z833 Family history of diabetes mellitus: Secondary | ICD-10-CM

## 2021-11-07 DIAGNOSIS — D649 Anemia, unspecified: Principal | ICD-10-CM

## 2021-11-07 DIAGNOSIS — B9689 Other specified bacterial agents as the cause of diseases classified elsewhere: Secondary | ICD-10-CM | POA: Diagnosis not present

## 2021-11-07 DIAGNOSIS — J209 Acute bronchitis, unspecified: Secondary | ICD-10-CM | POA: Diagnosis present

## 2021-11-07 DIAGNOSIS — D62 Acute posthemorrhagic anemia: Secondary | ICD-10-CM | POA: Diagnosis present

## 2021-11-07 DIAGNOSIS — I5033 Acute on chronic diastolic (congestive) heart failure: Secondary | ICD-10-CM | POA: Diagnosis present

## 2021-11-07 DIAGNOSIS — E782 Mixed hyperlipidemia: Secondary | ICD-10-CM | POA: Diagnosis present

## 2021-11-07 DIAGNOSIS — K922 Gastrointestinal hemorrhage, unspecified: Principal | ICD-10-CM | POA: Diagnosis present

## 2021-11-07 DIAGNOSIS — Z79899 Other long term (current) drug therapy: Secondary | ICD-10-CM

## 2021-11-07 DIAGNOSIS — Z6841 Body Mass Index (BMI) 40.0 and over, adult: Secondary | ICD-10-CM

## 2021-11-07 DIAGNOSIS — Z96652 Presence of left artificial knee joint: Secondary | ICD-10-CM | POA: Diagnosis present

## 2021-11-07 LAB — BASIC METABOLIC PANEL
Anion gap: 6 (ref 5–15)
BUN: 11 mg/dL (ref 8–23)
CO2: 26 mmol/L (ref 22–32)
Calcium: 9.4 mg/dL (ref 8.9–10.3)
Chloride: 104 mmol/L (ref 98–111)
Creatinine, Ser: 0.77 mg/dL (ref 0.44–1.00)
GFR, Estimated: >60 mL/min (ref >60)
Glucose, Bld: 111 mg/dL — ABNORMAL HIGH (ref 70–99)
Potassium: 3.5 mmol/L (ref 3.5–5.1)
Sodium: 136 mmol/L (ref 135–145)

## 2021-11-07 LAB — POC OCCULT BLOOD, ED: Fecal Occult Bld: POSITIVE — AB

## 2021-11-07 LAB — HEPATIC FUNCTION PANEL
ALT: 10 U/L (ref 0–44)
AST: 16 U/L (ref 15–41)
Albumin: 3.4 g/dL — ABNORMAL LOW (ref 3.5–5.0)
Alkaline Phosphatase: 61 U/L (ref 38–126)
Bilirubin, Direct: <0.1 mg/dL (ref 0.0–0.2)
Total Bilirubin: 0.5 mg/dL (ref 0.3–1.2)
Total Protein: 7.5 g/dL (ref 6.5–8.1)

## 2021-11-07 LAB — CBC
HCT: 21 % — ABNORMAL LOW (ref 36.0–46.0)
Hemoglobin: 5.2 g/dL — CL (ref 12.0–15.0)
MCH: 19.3 pg — ABNORMAL LOW (ref 26.0–34.0)
MCHC: 24.8 g/dL — ABNORMAL LOW (ref 30.0–36.0)
MCV: 78.1 fL — ABNORMAL LOW (ref 80.0–100.0)
Platelets: 444 10*3/uL — ABNORMAL HIGH (ref 150–400)
RBC: 2.69 MIL/uL — ABNORMAL LOW (ref 3.87–5.11)
RDW: 20.2 % — ABNORMAL HIGH (ref 11.5–15.5)
WBC: 12.2 10*3/uL — ABNORMAL HIGH (ref 4.0–10.5)
nRBC: 0.7 % — ABNORMAL HIGH (ref 0.0–0.2)

## 2021-11-07 LAB — TROPONIN I (HIGH SENSITIVITY)
Troponin I (High Sensitivity): 17 ng/L (ref <18)
Troponin I (High Sensitivity): 18 ng/L — ABNORMAL HIGH (ref <18)

## 2021-11-07 LAB — PREPARE RBC (CROSSMATCH)

## 2021-11-07 LAB — RESP PANEL BY RT-PCR (FLU A&B, COVID) ARPGX2
Influenza A by PCR: NEGATIVE
Influenza B by PCR: NEGATIVE
SARS Coronavirus 2 by RT PCR: NEGATIVE

## 2021-11-07 LAB — BRAIN NATRIURETIC PEPTIDE: B Natriuretic Peptide: 84.2 pg/mL (ref 0.0–100.0)

## 2021-11-07 MED ORDER — IPRATROPIUM-ALBUTEROL 0.5-2.5 (3) MG/3ML IN SOLN
3.0000 mL | Freq: Once | RESPIRATORY_TRACT | Status: AC
  Administered 2021-11-07: 3 mL via RESPIRATORY_TRACT
  Filled 2021-11-07: qty 3

## 2021-11-07 MED ORDER — METHYLPREDNISOLONE SODIUM SUCC 125 MG IJ SOLR
125.0000 mg | INTRAMUSCULAR | Status: AC
  Administered 2021-11-07: 125 mg via INTRAVENOUS
  Filled 2021-11-07: qty 2

## 2021-11-07 MED ORDER — PANTOPRAZOLE SODIUM 40 MG IV SOLR
40.0000 mg | Freq: Two times a day (BID) | INTRAVENOUS | Status: DC
  Administered 2021-11-08 (×2): 40 mg via INTRAVENOUS
  Filled 2021-11-07 (×2): qty 10

## 2021-11-07 MED ORDER — SODIUM CHLORIDE 0.9 % IV SOLN
10.0000 mL/h | Freq: Once | INTRAVENOUS | Status: DC
Start: 2021-11-07 — End: 2021-11-13
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
## 2021-11-07 NOTE — ED Notes (Signed)
Lab called for add-on labs.  ?

## 2021-11-07 NOTE — H&P (Signed)
History and Physical    Patient: [name] MRN: [id number] DOA: 11/07/2021  Date of Service: the patient was seen and examined on 11/08/2021  Patient coming from: Home  Chief Complaint:  Chief Complaint  Patient presents with   Shortness of Breath    HPI:   71 year old female with past medical history of COPD, chronic respiratory failure (on 2lpm via [place] at home), non-insulin-dependent diabetes mellitus type 2, hypertension, hyperlipidemia, gastroesophageal reflux disease, coronary artery disease, peripheral artery disease status post PV angiogram S/P right SFA stent, obstructive sleep apnea and hospitalizations for GI bleeding in the past who presents to [facility] emergency department with complaints of shortness of breath.  Of note, patient has been hospitalized multiple times for gastrointestinal bleeding in the past.  In 04/2021 patient was found to have multiple bleeding AVMs in the duodenum status post MR clips along with additional nonbleeding angiodysplastic lesions in the jejunum treated with monopolar probe.  Most recently patient was hospitalized at [facility] from 3/31 until 4/4 with 2-week history of melena requiring 2 unit packed red blood cell transfusion and small bowel capsule enteroscopy performed on 4/3 revealing nonbleeding AVMs managed conservatively.  Patient was discharged home on ASA/Pletal and PPI therapy.  Patient now presents to [facility] with complaints of shortness of breath.  Patient states that for approximate the past month she has been experiencing progressively worsening shortness of breath.  Initially this was mild in intensity but progressively has become more more severe.  Shortness of breath is worse with exertion and improved with rest.  Patient complains of associated generalized weakness and lightheadedness.  Patient denies any associated chest pain.  Patient also complains of a 2-week history of productive cough  with yellow sputum.  Patient denies fever.  Patient denies melena, hematemesis or bloody stools.  Due to progressively worsening symptoms the patient eventually presented to [facility] emergency department for evaluation.  Upon evaluation in the emergency department rectal exam was performed which did not reveal any gross evidence of bleeding but was Hemoccult positive.  Patient was found to have a markedly low hemoglobin of 5.2 concerning for recurrent gastrointestinal bleeding.  ER provider initiated a 1 unit packed red blood cell transfusion.  ER provider additionally sent a secure chat message to Dr. Fuller Plan questing consultation in the morning.  Hospitalist group was then called to assess the patient for admission the hospital.   Review of Systems: Review of Systems  Respiratory:  Positive for shortness of breath.   Neurological:  Positive for weakness.  All other systems reviewed and are negative.   Past Medical History:  Diagnosis Date   Anemia 12/26/2015   Anxiety    Arthritis    Blood transfusion without reported diagnosis    Chronic pain    resolved per pt 04/12/20   COPD (chronic obstructive pulmonary disease) (HCC)    Coronary artery disease    Depression    Diabetes mellitus without complication (HCC)    type 2   GERD (gastroesophageal reflux disease)    Heart murmur    since birth; Echo 02/18/19: LVEF 88%, grade 1 diastolic dysfunction, mild AS (mean grad 12 mmHg), trace MR/PR, mild TR, PASP 32 mmHg     Hyperlipemia    Hypertension    PVD (peripheral vascular disease) ([place])    right SFA stent 02/11/17 by Dr. Einar Gip   Sleep apnea    does not use cpap   Wears glasses  Past Surgical History:  Procedure Laterality Date   ABDOMINAL HYSTERECTOMY     CARDIAC CATHETERIZATION     CATARACT EXTRACTION     CESAREAN SECTION     COLONOSCOPY N/A 01/08/2013   Procedure: COLONOSCOPY;  Surgeon: Beryle Beams, MD;  Location: [facility] ENDOSCOPY;  Service: Endoscopy;   Laterality: N/A;   COLONOSCOPY N/A 12/28/2015   Procedure: COLONOSCOPY;  Surgeon: Carol Ada, MD;  Location: [place];  Service: Endoscopy;  Laterality: N/A;   COLONOSCOPY WITH PROPOFOL N/A 10/05/2020   Procedure: COLONOSCOPY WITH PROPOFOL;  Surgeon: Carol Ada, MD;  Location: [facility];  Service: Endoscopy;  Laterality: N/A;   ENTEROSCOPY N/A 12/28/2015   Procedure: ENTEROSCOPY;  Surgeon: Carol Ada, MD;  Location: [place];  Service: Endoscopy;  Laterality: N/A;   ENTEROSCOPY N/A 02/20/2018   Procedure: ENTEROSCOPY;  Surgeon: Carol Ada, MD;  Location: [facility];  Service: Endoscopy;  Laterality: N/A;   ENTEROSCOPY N/A 03/27/2018   Procedure: ENTEROSCOPY;  Surgeon: Carol Ada, MD;  Location: [facility];  Service: Endoscopy;  Laterality: N/A;   ENTEROSCOPY N/A 10/05/2020   Procedure: ENTEROSCOPY;  Surgeon: Carol Ada, MD;  [facility];  Service: Endoscopy;  Laterality: N/A;   ENTEROSCOPY N/A 05/11/2021   Procedure: ENTEROSCOPY;  Surgeon: Carol Ada, MD;  Location: [facility];  Service: Endoscopy;  Laterality: N/A;   ENTEROSCOPY N/A 09/23/2021   Procedure: ENTEROSCOPY;  Surgeon: Doran Stabler, MD;  Location: [place];  Service: Gastroenterology;  Laterality: N/A;   ESOPHAGOGASTRODUODENOSCOPY N/A 01/08/2013   Procedure: ESOPHAGOGASTRODUODENOSCOPY (EGD);  Surgeon: Beryle Beams, MD;  Location: [facility];  Service: Endoscopy;  Laterality: N/A;   EYE SURGERY     GIVENS CAPSULE STUDY N/A 09/24/2021   Procedure: GIVENS CAPSULE STUDY;  Surgeon: Carol Ada, MD;  Location: [place];  Service: Gastroenterology;  Laterality: N/A;   HAND SURGERY     HEMOSTASIS CLIP PLACEMENT  05/11/2021   Procedure: HEMOSTASIS CLIP PLACEMENT;  Surgeon: Carol Ada, MD;  [facility];  Service: Endoscopy;;   HOT HEMOSTASIS N/A 12/28/2015   Procedure: HOT HEMOSTASIS (ARGON PLASMA COAGULATION/BICAP);  Surgeon: Carol Ada, MD;  Location: [facility] ENDOSCOPY;   Service: Endoscopy;  Laterality: N/A;   HOT HEMOSTASIS N/A 02/20/2018   Procedure: HOT HEMOSTASIS (ARGON PLASMA COAGULATION/BICAP);  Surgeon: Carol Ada, MD;  Location: [facility];  Service: Endoscopy;  Laterality: N/A;   HOT HEMOSTASIS N/A 03/27/2018   Procedure: HOT HEMOSTASIS (ARGON PLASMA COAGULATION/BICAP);  Surgeon: Carol Ada, MD;  Location: [facility];  Service: Endoscopy;  Laterality: N/A;   HOT HEMOSTASIS N/A 10/05/2020   Procedure: HOT HEMOSTASIS (ARGON PLASMA COAGULATION/BICAP);  Surgeon: Carol Ada, MD;  Location: [facility];  Service: Endoscopy;  Laterality: N/A;   HOT HEMOSTASIS N/A 05/11/2021   Procedure: HOT HEMOSTASIS (ARGON PLASMA COAGULATION/BICAP);  Surgeon: Carol Ada, MD;  Location: [facility];  Service: Endoscopy;  Laterality: N/A;   LEFT HEART CATHETERIZATION WITH CORONARY ANGIOGRAM N/A 03/09/2013   Procedure: LEFT HEART CATHETERIZATION WITH CORONARY ANGIOGRAM;  Surgeon: Laverda Page, MD;  Location: [facility] CATH LAB;  Service: Cardiovascular;  Laterality: N/A;   LOWER EXTREMITY ANGIOGRAM N/A 12/29/2012   Procedure: LOWER EXTREMITY ANGIOGRAM;  Surgeon: Laverda Page, MD;  Location: [facility] CATH LAB;  Service: Cardiovascular;  Laterality: N/A;   LOWER EXTREMITY ANGIOGRAM N/A 10/05/2013   Procedure: LOWER EXTREMITY ANGIOGRAM;  Surgeon: Laverda Page, MD;  Location: [facility] CATH LAB;  Service: Cardiovascular;  Laterality: N/A;   LOWER EXTREMITY ANGIOGRAPHY N/A 02/11/2017   Procedure: Lower Extremity  Angiography;  Surgeon: Adrian Prows, MD;  Location: [place] CV LAB;  Service: Cardiovascular;  Laterality: N/A;   PERIPHERAL VASCULAR BALLOON ANGIOPLASTY  02/11/2017   Procedure: PERIPHERAL VASCULAR BALLOON ANGIOPLASTY;  Surgeon: Adrian Prows, MD;  Location: [place] CV LAB;  Service: Cardiovascular;;  Right SFA   PERIPHERAL VASCULAR INTERVENTION Right 02/11/2017   Procedure: PERIPHERAL VASCULAR INTERVENTION;  Surgeon: Adrian Prows, MD;  Location: [place] CV LAB;   Service: Cardiovascular;  Laterality: Right;  Rt SFA   POLYPECTOMY  10/05/2020   Procedure: POLYPECTOMY;  Surgeon: Carol Ada, MD;  Location: [facility]OS[facility];  Service: Endoscopy;;   stent in right leg  12/29/2012   for blood clot, Dr. Nadyne Coombes   TOTAL KNEE ARTHROPLASTY Left 04/18/2020   TOTAL KNEE ARTHROPLASTY Left 04/18/2020   Procedure: LEFT TOTAL KNEE ARTHROPLASTY;  Surgeon: Meredith Pel, MD;  Location: [place];  Service: Orthopedics;  Laterality: Left;   TYMPANOMASTOIDECTOMY Left 03/08/2014   Procedure: TYMPANOMASTOIDECTOMY LEFT;  Surgeon: Ascencion Dike, MD;  Location: [place];  Service: ENT;  Laterality: Left;   TYMPANOMASTOIDECTOMY  2015   UTERINE FIBROID SURGERY      Social History:  reports that she has quit smoking. Her smoking use included cigarettes. She has a 20.50 pack-year smoking history. She has never used smokeless tobacco. She reports current alcohol use. She reports current drug use. Drugs: Marijuana and Cocaine.  No Known Allergies  Family History  Problem Relation Age of Onset   Diabetes Mother    Hypertension Mother    Heart disease Mother    Heart disease Father    Hypertension Father    Hypertension Sister    Hypertension Brother    Diabetes Brother    Hypertension Sister    Hypertension Brother    Diabetes Brother    Hypertension Brother    Hypertension Brother    Hypertension Brother     Prior to Admission medications   Medication Sig Start Date End Date Taking? Authorizing Provider  acetaminophen (TYLENOL) 500 MG tablet Take 1,000 mg by mouth daily as needed for moderate pain.    [provider]  albuterol (PROVENTIL HFA;VENTOLIN HFA) 108 (90 Base) MCG/ACT inhaler Inhale 1-2 puffs into the lungs every 6 (six) hours as needed for wheezing or shortness of breath.    [provider]  amLODipine (NORVASC) 10 MG tablet Take 1 tablet (10 mg total) by mouth daily. 09/25/21 09/25/22  Thurnell Lose, MD  ASPIRIN LOW DOSE 81 MG  EC tablet TAKE 1 TABLET(81 MG) BY MOUTH DAILY Patient taking differently: Take 81 mg by mouth daily. 06/30/20   Magnant, Charles L, PA-C  atorvastatin (LIPITOR) 80 MG tablet Take 80 mg by mouth daily. 08/08/20   [provider]  cilostazol (PLETAL) 100 MG tablet Take 1 tablet (100 mg total) by mouth 2 (two) times daily. 09/10/21 03/09/22  [name], [name], DO  doxycycline (VIBRAMYCIN) 100 MG capsule Take 100 mg by mouth See admin instructions. Bid x 10 days 09/19/21   [provider]  ezetimibe (ZETIA) 10 MG tablet TAKE 1 TABLET BY MOUTH DAILY 10/15/21   [name], [name], DO  ferrous sulfate 325 (65 FE) MG tablet Take 325 mg by mouth daily.    [provider]  fluticasone (FLONASE) 50 MCG/ACT nasal spray Place 1 spray into both nostrils daily as needed for allergies. 08/14/21   [provider]  gabapentin (NEURONTIN) 600 MG tablet Take 1 tablet (600 mg total) by mouth at bedtime. Patient  taking differently: Take 600 mg by mouth 2 (two) times daily. 10/02/17   Hyatt, Max T, DPM  ipratropium-albuterol (DUONEB) 0.5-2.5 (3) MG/3ML SOLN Take 3 mLs by nebulization 3 (three) times daily. 05/12/21 08/24/22  Donne Hazel, MD  metFORMIN (GLUCOPHAGE) 500 MG tablet Take 0.5 tablets (250 mg total) by mouth 2 (two) times daily with a meal. 02/13/17   Adrian Prows, MD  methocarbamol (ROBAXIN) 500 MG tablet Take 1 tablet (500 mg total) by mouth every 8 (eight) hours as needed for muscle spasms. 10/22/21   Magnant, Charles L, PA-C  MYRBETRIQ 25 MG TB24 tablet Take 25 mg by mouth daily. 08/10/21   [provider]  nitroGLYCERIN (NITROSTAT) 0.4 MG SL tablet Place 1 tablet (0.4 mg total) under the tongue every 5 (five) minutes as needed for chest pain. 01/13/19 09/21/21  Miquel Dunn, NP  OXYGEN Inhale 2 L into the lungs continuous.    [provider]  pantoprazole (PROTONIX) 40 MG tablet Take 1 tablet (40 mg total) by mouth 2 (two) times daily. 09/25/21   Thurnell Lose, MD   spironolactone (ALDACTONE) 50 MG tablet TAKE 1 TABLET(50 MG) BY MOUTH DAILY Patient taking differently: Take 50 mg by mouth daily. 03/09/21   Rex Kras, DO    Physical Exam:  Vitals:   11/07/21 2000 11/07/21 2031 11/07/21 2133 11/07/21 2221  BP: 127/84 (!) 151/55 140/68 (!) 146/71  Pulse: (!) 108 (!) 103 (!) 101 99  Resp: (!) 24 20 18  (!) 21  Temp:      TempSrc:      SpO2: 97% 98% 98% 100%    Constitutional: Lethargic but arousable, oriented x3, no associated distress.   Skin: no rashes, no lesions, good skin turgor noted. Eyes: Pupils are equally reactive to light.  No evidence of scleral icterus or conjunctival pallor.  ENMT: Moist mucous membranes noted.  Posterior pharynx clear of any exudate or lesions.   Neck: normal, supple, no masses, no thyromegaly.  No evidence of jugular venous distension.   Respiratory: Scattered rhonchi bilaterally, notable expiratory wheezing in all fields.   no crackles. Normal respiratory effort. No accessory muscle use.  Cardiovascular: Regular rate and rhythm, no murmurs / rubs / gallops. No extremity edema. 2+ pedal pulses. No carotid bruits.  Chest:   Nontender without crepitus or deformity.   Back:   Nontender without crepitus or deformity. Abdomen: Abdomen is soft and nontender.  No evidence of intra-abdominal masses.  Positive bowel sounds noted in all quadrants.   Musculoskeletal: No joint deformity upper and lower extremities. Good ROM, no contractures. Normal muscle tone.  Neurologic: CN 2-12 grossly intact. Sensation intact.  Patient moving all 4 extremities spontaneously.  Patient is following all commands.  Patient is responsive to verbal stimuli.   Psychiatric: Patient exhibits normal mood with appropriate affect.  Patient seems to possess insight as to their current situation.    Data Reviewed:  I have personally reviewed and interpreted labs, imaging.  Significant findings are:   COVID-19 PCR testing negative. Hemoglobin noted  to be 5.2, down from 7.7 on 4/4.   Troponin noted to be 17 followed by 18.   Stool Hemoccult found to be positive Chest x-ray personally reviewed revealing faint patchy bibasilar opacities concerning for early cardiogenic pulmonary edema  EKG: Personally reviewed.  Rhythm is sinus tachycardia with heart rate of 113 bpm.  Notable PVCs.  No dynamic ST segment changes appreciated.    Assessment and Plan: * Acute on chronic blood  loss anemia Patient presenting with progressively worsening shortness of breath Shortness of breath possibly secondary to symptomatic anemia as patient's hemoglobin is currently 5.2, down from 7.7 several weeks prior No gross bleeding upon evaluation in the emergency department however stool Hemoccult is positive ER providers already ordered 1 unit packed red blood cell transfusion, I will add order an additional 1 unit to be transfused We will perform serial CBCs every 6 hours to ensure stability of H/H after transfusion is complete ER provider has reached out to Dr. Fuller Plan with gastroenterology secure chat to request consultation in the morning We will keep patient n.p.o. after midnight in case of endoscopic intervention Protonix 40 mg IV every 12 hours is being administered Temporarily holding home regimen of aspirin and Pletal.   Acute upper gastrointestinal bleeding See assessment and plan above.  Acute cardiogenic pulmonary edema (HCC) Evidence of mild pulmonary edema on chest x-ray Possibly multifactorial thought to be secondary to symptomatic anemia in addition to a degree of cardiogenic pulm edema possibly from transient from high-output heart failure  less likely due to an exacerbation of COPD We will administer a dose of intravenous Lasix in addition to correcting anemia with blood transfusion We will concurrently provide patient with as needed bronchodilator therapy Monitor for symptomatic improvement  Chronic respiratory failure with hypoxia  ([place]) Per review of records patient is on 2 L of oxygen via nasal cannula at home We will continue to titrate supplemental oxygen to maintain oxygen saturations between 89 to 92%.  Elevated troponin level not due myocardial infarction Minimally elevated troponin Patient is currently chest pain-free No evidence of ST segment change on EKG Minimally elevated troponin likely secondary to supply/demand mismatch, unlikely to be secondary to plaque rupture.  COPD (chronic obstructive pulmonary disease) (HCC) No evidence of COPD exacerbation this time Continue home regimen of maintenance inhalers As needed bronchodilator therapy for episodic shortness of breath and wheezing.   Type 2 diabetes mellitus without complication, without long-term current use of insulin (HCC) Patient been placed on Accu-Cheks before every meal and nightly with sliding scale insulin Holding home regimen of hypoglycemics Hemoglobin A1C 4.9% 08/2021 Once NPO discontinued will place on Diabetic Diet   Coronary artery disease involving native coronary artery of native heart without angina pectoris Patient is currently chest pain free Monitoring patient on telemetry Temporarily holding antiplatelet therapy Continue home regimen of lipid lowering therapy   Essential hypertension Resume patients home regimen of oral antihypertensives Titrate antihypertensive regimen as necessary to achieve adequate BP control PRN intravenous antihypertensives for excessively elevated blood pressure    Mixed diabetic hyperlipidemia associated with type 2 diabetes mellitus ([place]) Continuing home regimen of lipid lowering therapy.        Code Status:  Full code  code status decision has been confirmed with: patient Family Communication: deferred   Consults: EDP sent secure chat to Dr. Fuller Plan with GI requesting consult.   Severity of Illness:  The appropriate patient status for this patient is OBSERVATION. Observation status  is judged to be reasonable and necessary in order to provide the required intensity of service to ensure the patient's safety. The patient's presenting symptoms, physical exam findings, and initial radiographic and laboratory data in the context of their medical condition is felt to place them at decreased risk for further clinical deterioration. Furthermore, it is anticipated that the patient will be medically stable for discharge from the hospital within 2 midnights of admission.   Author:  Vernelle Emerald MD  11/08/2021  12:32 AM

## 2021-11-07 NOTE — ED Provider Notes (Signed)
?[place] ?Provider Note ? ? ?CSN: [id number] ?Arrival date & time: 11/07/21  1756 ? ?  ? ?History ? ?Chief Complaint  ?Patient presents with  ? Shortness of Breath  ? ? ?[name] is a 71 y.o. female. ? ?HPI ? 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  Patient states this has been worsening over the past month, at its most severe today.  She is increased her home nasal cannula from 2 to 3 L.  She has a wet cough productive of white/yellow phlegm but denies any fever.  She had intermittent chest discomfort and endorses swelling of her bilateral lower extremities.  No vomiting or diarrhea.  Follows with gastroenterology as an outpatient, Dr. Benson Norway. ? ?Home Medications ?Prior to Admission medications   ?Medication Sig Start Date End Date Taking? Authorizing Provider  ?acetaminophen (TYLENOL) 500 MG tablet Take 1,000 mg by mouth daily as needed for moderate pain.    [provider]  ?albuterol (PROVENTIL HFA;VENTOLIN HFA) 108 (90 Base) MCG/ACT inhaler Inhale 1-2 puffs into the lungs every 6 (six) hours as needed for wheezing or shortness of breath.    [provider]  ?amLODipine (NORVASC) 10 MG tablet Take 1 tablet (10 mg total) by mouth daily. 09/25/21 09/25/22  Thurnell Lose, MD  ?ASPIRIN LOW DOSE 81 MG EC tablet TAKE 1 TABLET(81 MG) BY MOUTH DAILY ?Patient taking differently: Take 81 mg by mouth daily. 06/30/20   Magnant, Charles L, PA-C  ?atorvastatin (LIPITOR) 80 MG tablet Take 80 mg by mouth daily. 08/08/20   [provider]  ?cilostazol (PLETAL) 100 MG tablet Take 1 tablet (100 mg total) by mouth 2 (two) times daily. 09/10/21 03/09/22  [name], [name], DO  ?doxycycline (VIBRAMYCIN) 100 MG capsule Take 100 mg by mouth See admin instructions. Bid x  10 days 09/19/21   [provider]  ?ezetimibe (ZETIA) 10 MG tablet TAKE 1 TABLET BY MOUTH DAILY 10/15/21   [name], [name], DO  ?ferrous sulfate 325 (65 FE) MG tablet Take 325 mg by mouth daily.    [provider]  ?fluticasone (FLONASE) 50 MCG/ACT nasal spray Place 1 spray into both nostrils daily as needed for allergies. 08/14/21   [provider]  ?gabapentin (NEURONTIN) 600 MG tablet Take 1 tablet (600 mg total) by mouth at bedtime. ?Patient taking differently: Take 600 mg by mouth 2 (two) times daily. 10/02/17   Hyatt, Max T, DPM  ?ipratropium-albuterol (DUONEB) 0.5-2.5 (3) MG/3ML SOLN Take 3 mLs by nebulization 3 (three) times daily. 05/12/21 08/24/22  Donne Hazel, MD  ?metFORMIN (GLUCOPHAGE) 500 MG tablet Take 0.5 tablets (250 mg total) by mouth 2 (two) times daily with a meal. 02/13/17   Adrian Prows, MD  ?methocarbamol (ROBAXIN) 500 MG tablet Take 1 tablet (500 mg total) by mouth every 8 (eight) hours as needed for muscle spasms. 10/22/21   Magnant, Gerrianne Scale, PA-C  ?MYRBETRIQ 25 MG TB24 tablet Take 25 mg by mouth daily. 08/10/21   [provider]  ?nitroGLYCERIN (NITROSTAT) 0.4 MG SL tablet Place 1 tablet (0.4 mg total) under the tongue every 5 (five) minutes as needed for chest pain. 01/13/19 09/21/21  Miquel Dunn, NP  ?OXYGEN Inhale 2 L into the lungs continuous.    [provider]  ?pantoprazole (PROTONIX) 40 MG tablet Take 1 tablet (  40 mg total) by mouth 2 (two) times daily. 09/25/21   Thurnell Lose, MD  ?spironolactone (ALDACTONE) 50 MG tablet TAKE 1 TABLET(50 MG) BY MOUTH DAILY ?Patient taking differently: Take 50 mg by mouth daily. 03/09/21   Rex Kras, DO  ?   ? ?Allergies    ?Patient has no known allergies.   ? ?Review of Systems   ?Review of Systems  ?Constitutional:  Positive for fatigue. Negative for fever.  ?Respiratory:  Positive for cough, chest tightness and shortness of breath.   ?Cardiovascular:  Positive for leg swelling. Negative for chest  pain and palpitations.  ?Gastrointestinal:  Negative for abdominal pain, diarrhea and vomiting.  ?Musculoskeletal:  Negative for back pain.  ?Skin:  Negative for rash.  ?Neurological:  Positive for light-headedness. Negative for headaches.  ? ?Physical Exam ?Updated Vital Signs ?BP (!) 151/55   Pulse (!) 103   Temp 99.8 ?F (37.7 ?C) (Oral)   Resp 20   LMP  (LMP Unknown)   SpO2 98%  ?Physical Exam ?Vitals and nursing note reviewed.  ?Constitutional:   ?   General: She is not in acute distress. ?   Appearance: Normal appearance. She is not diaphoretic.  ?HENT:  ?   Head: Normocephalic.  ?   Mouth/Throat:  ?   Mouth: Mucous membranes are moist.  ?Cardiovascular:  ?   Rate and Rhythm: Tachycardia present.  ?Pulmonary:  ?   Effort: Pulmonary effort is normal. Tachypnea present. No respiratory distress.  ?   Breath sounds: Decreased breath sounds, wheezing and rales present.  ?Abdominal:  ?   Palpations: Abdomen is soft.  ?   Tenderness: There is no abdominal tenderness.  ?Musculoskeletal:  ?   Right lower leg: Edema present.  ?   Left lower leg: Edema present.  ?Skin: ?   General: Skin is warm.  ?Neurological:  ?   Mental Status: She is alert and oriented to person, place, and time. Mental status is at baseline.  ?Psychiatric:     ?   Mood and Affect: Mood normal.  ? ? ?ED Results / Procedures / Treatments   ?Labs ?(all labs ordered are listed, but only abnormal results are displayed) ?Labs Reviewed  ?CBC - Abnormal; Notable for the following components:  ?    Result Value  ? WBC 12.2 (*)   ? RBC 2.69 (*)   ? Hemoglobin 5.2 (*)   ? HCT 21.0 (*)   ? MCV 78.1 (*)   ? MCH 19.3 (*)   ? MCHC 24.8 (*)   ? RDW 20.2 (*)   ? Platelets 444 (*)   ? nRBC 0.7 (*)   ? All other components within normal limits  ?BASIC METABOLIC PANEL - Abnormal; Notable for the following components:  ? Glucose, Bld 111 (*)   ? All other components within normal limits  ?HEPATIC FUNCTION PANEL - Abnormal; Notable for the following components:  ?  Albumin 3.4 (*)   ? All other components within normal limits  ?RESP PANEL BY RT-PCR (FLU A&B, COVID) [id number]  ?BRAIN NATRIURETIC PEPTIDE  ?POC OCCULT BLOOD, ED  ?TYPE AND SCREEN  ?TROPONIN I (HIGH SENSITIVITY)  ?TROPONIN I (HIGH SENSITIVITY)  ? ? ?EKG ?EKG Interpretation ? ?Date/Time:  Wednesday Nov 07 2021 18:38:24 EDT ?Ventricular Rate:  113 ?PR Interval:  134 ?QRS Duration: 76 ?QT Interval:  302 ?QTC Calculation: 414 ?R Axis:   83 ?Text Interpretation: Sinus tachycardia with frequent Premature ventricular complexes Otherwise normal ECG When compared with  ECG of 21-Sep-2021 20:08, PREVIOUS ECG IS PRESENT Confirmed by Lavenia Atlas [phone]) on 11/07/2021 7:39:01 PM ? ?Radiology ?DG Chest 2 View ? ?Result Date: 11/07/2021 ?CLINICAL DATA:  Shortness of breath EXAM: CHEST - 2 VIEW COMPARISON:  Chest radiograph dated 09/21/2021. FINDINGS: The heart is enlarged. Vascular calcifications are seen in the aortic arch. Moderate bilateral lower lung predominant interstitial opacities are noted. There is no pleural effusion or pneumothorax. Degenerative changes are seen in the spine. IMPRESSION: Cardiomegaly and bilateral interstitial opacities may represent congestive heart failure. Aortic Atherosclerosis (ICD10-I70.0). Electronically Signed   By: Zerita Boers M.D.   On: 11/07/2021 19:35   ? ?Procedures ?Marland KitchenCritical Care ?Performed by: Lorelle Gibbs, DO ?Authorized by: Lorelle Gibbs, DO  ? ?Critical care provider statement:  ?  Critical care time (minutes):  60 ?  Critical care was necessary to treat or prevent imminent or life-threatening deterioration of the following conditions:  Cardiac failure and metabolic crisis ?  Critical care was time spent personally by me on the following activities:  Development of treatment plan with patient or surrogate, discussions with consultants, evaluation of patient's response to treatment, examination of patient, ordering and review of laboratory studies, ordering and review of  radiographic studies, ordering and performing treatments and interventions, pulse oximetry, re-evaluation of patient's condition and review of old charts ?  I assumed direction of critical care for this patient

## 2021-11-07 NOTE — ED Provider Triage Note (Signed)
Emergency Medicine Provider Triage Evaluation Note ? ?[name] , a 71 y.o. female  was evaluated in triage.  Pt complains of shortness of breath seems that has been ongoing worsening over the past 1 month.  She tells me she uses her inhalers.  My evaluation is somewhat limited by her tachypnea and difficulty breathing and short sentence structure. ? ?No fevers at home. ? ?Wears oxygen 3 L at home ? ?Review of Systems  ?Positive: SOB, chest pressure ?Negative: Fever  ? ?Physical Exam  ?BP 133/78 (BP Location: Left Arm)   Pulse 88   Temp 99.8 ?F (37.7 ?C) (Oral)   Resp (!) 24   LMP  (LMP Unknown)   SpO2 (!) 89%  ?Gen:   Awake, ill-appearing, answering questions ?Resp:  Normal phonation, speaking in shortened sentences, prolonged expiration phase, wheezing, tachypnea ?MSK:   Moves extremities without difficulty  ?Other:  Wheezing, tachypnea, prolonged expiratory phase, ill-appearing ? ?Medical Decision Making  ?Medically screening exam initiated at 6:47 PM.  Appropriate orders placed.  Daryl Aundra Pung was informed that the remainder of the evaluation will be completed by another provider, this initial triage assessment does not replace that evaluation, and the importance of remaining in the ED until their evaluation is complete. ? ?Charge aware of pt - will need bed placement ASAP ?  ?Tedd Sias, [place] ?11/07/21 1848 ? ?

## 2021-11-08 ENCOUNTER — Observation Stay (HOSPITAL_COMMUNITY): Payer: Medicare Other

## 2021-11-08 DIAGNOSIS — J9621 Acute and chronic respiratory failure with hypoxia: Secondary | ICD-10-CM | POA: Diagnosis not present

## 2021-11-08 DIAGNOSIS — K921 Melena: Secondary | ICD-10-CM | POA: Diagnosis not present

## 2021-11-08 DIAGNOSIS — K922 Gastrointestinal hemorrhage, unspecified: Secondary | ICD-10-CM | POA: Diagnosis present

## 2021-11-08 DIAGNOSIS — Z6841 Body Mass Index (BMI) 40.0 and over, adult: Secondary | ICD-10-CM | POA: Diagnosis not present

## 2021-11-08 DIAGNOSIS — F32A Depression, unspecified: Secondary | ICD-10-CM | POA: Diagnosis not present

## 2021-11-08 DIAGNOSIS — E1169 Type 2 diabetes mellitus with other specified complication: Secondary | ICD-10-CM | POA: Diagnosis not present

## 2021-11-08 DIAGNOSIS — Z9981 Dependence on supplemental oxygen: Secondary | ICD-10-CM | POA: Diagnosis not present

## 2021-11-08 DIAGNOSIS — R0602 Shortness of breath: Secondary | ICD-10-CM | POA: Diagnosis not present

## 2021-11-08 DIAGNOSIS — R059 Cough, unspecified: Secondary | ICD-10-CM | POA: Diagnosis not present

## 2021-11-08 DIAGNOSIS — D509 Iron deficiency anemia, unspecified: Secondary | ICD-10-CM | POA: Diagnosis not present

## 2021-11-08 DIAGNOSIS — I110 Hypertensive heart disease with heart failure: Secondary | ICD-10-CM | POA: Diagnosis not present

## 2021-11-08 DIAGNOSIS — R079 Chest pain, unspecified: Secondary | ICD-10-CM | POA: Diagnosis not present

## 2021-11-08 DIAGNOSIS — Z7984 Long term (current) use of oral hypoglycemic drugs: Secondary | ICD-10-CM | POA: Diagnosis not present

## 2021-11-08 DIAGNOSIS — Z7902 Long term (current) use of antithrombotics/antiplatelets: Secondary | ICD-10-CM | POA: Diagnosis not present

## 2021-11-08 DIAGNOSIS — E1151 Type 2 diabetes mellitus with diabetic peripheral angiopathy without gangrene: Secondary | ICD-10-CM | POA: Diagnosis present

## 2021-11-08 DIAGNOSIS — R5381 Other malaise: Secondary | ICD-10-CM | POA: Diagnosis present

## 2021-11-08 DIAGNOSIS — K31811 Angiodysplasia of stomach and duodenum with bleeding: Secondary | ICD-10-CM | POA: Diagnosis not present

## 2021-11-08 DIAGNOSIS — B9689 Other specified bacterial agents as the cause of diseases classified elsewhere: Secondary | ICD-10-CM

## 2021-11-08 DIAGNOSIS — I21A1 Myocardial infarction type 2: Secondary | ICD-10-CM | POA: Diagnosis not present

## 2021-11-08 DIAGNOSIS — I5033 Acute on chronic diastolic (congestive) heart failure: Secondary | ICD-10-CM

## 2021-11-08 DIAGNOSIS — D62 Acute posthemorrhagic anemia: Secondary | ICD-10-CM | POA: Diagnosis not present

## 2021-11-08 DIAGNOSIS — J441 Chronic obstructive pulmonary disease with (acute) exacerbation: Secondary | ICD-10-CM | POA: Diagnosis not present

## 2021-11-08 DIAGNOSIS — J209 Acute bronchitis, unspecified: Secondary | ICD-10-CM | POA: Diagnosis present

## 2021-11-08 DIAGNOSIS — Z20822 Contact with and (suspected) exposure to covid-19: Secondary | ICD-10-CM | POA: Diagnosis not present

## 2021-11-08 DIAGNOSIS — Z7982 Long term (current) use of aspirin: Secondary | ICD-10-CM | POA: Diagnosis not present

## 2021-11-08 DIAGNOSIS — I2510 Atherosclerotic heart disease of native coronary artery without angina pectoris: Secondary | ICD-10-CM | POA: Diagnosis not present

## 2021-11-08 DIAGNOSIS — G4733 Obstructive sleep apnea (adult) (pediatric): Secondary | ICD-10-CM | POA: Diagnosis present

## 2021-11-08 DIAGNOSIS — E782 Mixed hyperlipidemia: Secondary | ICD-10-CM | POA: Diagnosis present

## 2021-11-08 DIAGNOSIS — K219 Gastro-esophageal reflux disease without esophagitis: Secondary | ICD-10-CM | POA: Diagnosis not present

## 2021-11-08 DIAGNOSIS — I10 Essential (primary) hypertension: Secondary | ICD-10-CM | POA: Diagnosis not present

## 2021-11-08 LAB — PROTIME-INR
INR: 1.2 (ref 0.8–1.2)
Prothrombin Time: 14.6 seconds (ref 11.4–15.2)

## 2021-11-08 LAB — COMPREHENSIVE METABOLIC PANEL
ALT: 8 U/L (ref 0–44)
AST: 13 U/L — ABNORMAL LOW (ref 15–41)
Albumin: 3.2 g/dL — ABNORMAL LOW (ref 3.5–5.0)
Alkaline Phosphatase: 53 U/L (ref 38–126)
Anion gap: 7 (ref 5–15)
BUN: 11 mg/dL (ref 8–23)
CO2: 26 mmol/L (ref 22–32)
Calcium: 8.9 mg/dL (ref 8.9–10.3)
Chloride: 103 mmol/L (ref 98–111)
Creatinine, Ser: 0.69 mg/dL (ref 0.44–1.00)
GFR, Estimated: >60 mL/min (ref >60)
Glucose, Bld: 112 mg/dL — ABNORMAL HIGH (ref 70–99)
Potassium: 3.7 mmol/L (ref 3.5–5.1)
Sodium: 136 mmol/L (ref 135–145)
Total Bilirubin: 0.9 mg/dL (ref 0.3–1.2)
Total Protein: 6.7 g/dL (ref 6.5–8.1)

## 2021-11-08 LAB — PREPARE RBC (CROSSMATCH)

## 2021-11-08 LAB — HEMOGLOBIN AND HEMATOCRIT, BLOOD
HCT: 24.5 % — ABNORMAL LOW (ref 36.0–46.0)
HCT: 25.9 % — ABNORMAL LOW (ref 36.0–46.0)
Hemoglobin: 7.2 g/dL — ABNORMAL LOW (ref 12.0–15.0)
Hemoglobin: 7.5 g/dL — ABNORMAL LOW (ref 12.0–15.0)

## 2021-11-08 LAB — BRAIN NATRIURETIC PEPTIDE: B Natriuretic Peptide: 217.8 pg/mL — ABNORMAL HIGH (ref 0.0–100.0)

## 2021-11-08 LAB — ECHOCARDIOGRAM COMPLETE
AR max vel: 1.98 cm2
AV Area VTI: 1.69 cm2
AV Area mean vel: 1.76 cm2
AV Mean grad: 12 mmHg
AV Peak grad: 16.1 mmHg
Ao pk vel: 2.01 m/s
Area-P 1/2: 3.37 cm2
Height: 60 in
S' Lateral: 3.05 cm
Weight: 3587.33 oz

## 2021-11-08 LAB — APTT: aPTT: 27 seconds (ref 24–36)

## 2021-11-08 LAB — GLUCOSE, CAPILLARY
Glucose-Capillary: 160 mg/dL — ABNORMAL HIGH (ref 70–99)
Glucose-Capillary: 138 mg/dL — ABNORMAL HIGH (ref 70–99)
Glucose-Capillary: 124 mg/dL — ABNORMAL HIGH (ref 70–99)
Glucose-Capillary: 93 mg/dL (ref 70–99)
Glucose-Capillary: 113 mg/dL — ABNORMAL HIGH (ref 70–99)

## 2021-11-08 LAB — MAGNESIUM: Magnesium: 1.7 mg/dL (ref 1.7–2.4)

## 2021-11-08 MED ORDER — ACETAMINOPHEN 325 MG PO TABS
650.0000 mg | ORAL_TABLET | Freq: Four times a day (QID) | ORAL | Status: DC | PRN
Start: 2021-11-08 — End: 2021-11-13
  Administered 2021-11-08 – 2021-11-12 (×5): 650 mg via ORAL
  Filled 2021-11-08 (×5): qty 2

## 2021-11-08 MED ORDER — ONDANSETRON HCL 4 MG PO TABS
4.0000 mg | ORAL_TABLET | Freq: Four times a day (QID) | ORAL | Status: DC | PRN
Start: 2021-11-08 — End: 2021-11-13

## 2021-11-08 MED ORDER — NITROGLYCERIN 0.4 MG SL SUBL
0.4000 mg | SUBLINGUAL_TABLET | SUBLINGUAL | Status: DC | PRN
  Administered 2021-11-12 – 2021-11-13 (×4): 0.4 mg via SUBLINGUAL
  Filled 2021-11-08 (×2): qty 1

## 2021-11-08 MED ORDER — FLUTICASONE PROPIONATE 50 MCG/ACT NA SUSP: 1.0000 | Freq: Every day | NASAL | Status: DC | PRN

## 2021-11-08 MED ORDER — ACETAMINOPHEN 650 MG RE SUPP: 650.0000 mg | Freq: Four times a day (QID) | RECTAL | Status: DC | PRN

## 2021-11-08 MED ORDER — HYDRALAZINE HCL 20 MG/ML IJ SOLN
10.0000 mg | Freq: Four times a day (QID) | INTRAMUSCULAR | Status: DC | PRN
Start: 2021-11-08 — End: 2021-11-13

## 2021-11-08 MED ORDER — EZETIMIBE 10 MG PO TABS
10.0000 mg | ORAL_TABLET | Freq: Every day | ORAL | Status: DC
  Administered 2021-11-08 – 2021-11-13 (×6): 10 mg via ORAL
  Filled 2021-11-08 (×6): qty 1

## 2021-11-08 MED ORDER — POLYETHYLENE GLYCOL 3350 17 G PO PACK: 17.0000 g | PACK | Freq: Every day | ORAL | Status: DC | PRN

## 2021-11-08 MED ORDER — SODIUM CHLORIDE 0.9 % IV SOLN
100.0000 mg | Freq: Two times a day (BID) | INTRAVENOUS | Status: DC
  Administered 2021-11-08 (×2): 100 mg via INTRAVENOUS
  Filled 2021-11-08 (×4): qty 100

## 2021-11-08 MED ORDER — ONDANSETRON HCL 4 MG/2ML IJ SOLN
4.0000 mg | Freq: Four times a day (QID) | INTRAMUSCULAR | Status: DC | PRN
Start: 2021-11-08 — End: 2021-11-13

## 2021-11-08 MED ORDER — FUROSEMIDE 10 MG/ML IJ SOLN
20.0000 mg | Freq: Once | INTRAMUSCULAR | Status: AC
  Administered 2021-11-08: 20 mg via INTRAVENOUS
  Filled 2021-11-08: qty 2

## 2021-11-08 MED ORDER — IPRATROPIUM-ALBUTEROL 0.5-2.5 (3) MG/3ML IN SOLN
3.0000 mL | RESPIRATORY_TRACT | Status: DC | PRN
Start: 2021-11-08 — End: 2021-11-09
  Administered 2021-11-09: 3 mL via RESPIRATORY_TRACT
  Filled 2021-11-08: qty 3

## 2021-11-08 MED ORDER — INSULIN ASPART 100 UNIT/ML IJ SOLN
0.0000 [IU] | Freq: Three times a day (TID) | INTRAMUSCULAR | Status: DC
  Administered 2021-11-08 (×2): 2 [IU] via SUBCUTANEOUS
  Administered 2021-11-09 (×3): 3 [IU] via SUBCUTANEOUS

## 2021-11-08 MED ORDER — MIRABEGRON ER 25 MG PO TB24
25.0000 mg | ORAL_TABLET | Freq: Every day | ORAL | Status: DC
  Administered 2021-11-08 – 2021-11-13 (×6): 25 mg via ORAL
  Filled 2021-11-08 (×6): qty 1

## 2021-11-08 MED ORDER — FUROSEMIDE 10 MG/ML IJ SOLN
40.0000 mg | Freq: Once | INTRAMUSCULAR | Status: AC
  Administered 2021-11-08: 40 mg via INTRAVENOUS
  Filled 2021-11-08: qty 4

## 2021-11-08 MED ORDER — ATORVASTATIN CALCIUM 80 MG PO TABS
80.0000 mg | ORAL_TABLET | Freq: Every day | ORAL | Status: DC
  Administered 2021-11-08 – 2021-11-13 (×6): 80 mg via ORAL
  Filled 2021-11-08 (×6): qty 1

## 2021-11-08 MED ORDER — AMLODIPINE BESYLATE 10 MG PO TABS: 10.0000 mg | ORAL_TABLET | Freq: Every day | ORAL | Status: DC

## 2021-11-08 MED ORDER — IPRATROPIUM-ALBUTEROL 0.5-2.5 (3) MG/3ML IN SOLN
3.0000 mL | Freq: Three times a day (TID) | RESPIRATORY_TRACT | Status: DC
  Administered 2021-11-08 – 2021-11-10 (×6): 3 mL via RESPIRATORY_TRACT
  Filled 2021-11-08 (×7): qty 3

## 2021-11-08 MED ORDER — PANTOPRAZOLE SODIUM 40 MG PO TBEC: 40.0000 mg | DELAYED_RELEASE_TABLET | Freq: Two times a day (BID) | ORAL | Status: DC

## 2021-11-08 MED ORDER — GABAPENTIN 300 MG PO CAPS
600.0000 mg | ORAL_CAPSULE | Freq: Two times a day (BID) | ORAL | Status: DC
  Administered 2021-11-08 – 2021-11-13 (×12): 600 mg via ORAL
  Filled 2021-11-08 (×12): qty 2

## 2021-11-08 MED ORDER — SODIUM CHLORIDE 0.9% IV SOLUTION: Freq: Once | INTRAVENOUS | Status: AC

## 2021-11-08 NOTE — Assessment & Plan Note (Signed)
   No evidence of COPD exacerbation this time  Continue home regimen of maintenance inhalers  As needed bronchodilator therapy for episodic shortness of breath and wheezing.  

## 2021-11-08 NOTE — Assessment & Plan Note (Signed)
   See assessment and plan above.

## 2021-11-08 NOTE — Assessment & Plan Note (Signed)
.   Resume patients home regimen of oral antihypertensives . Titrate antihypertensive regimen as necessary to achieve adequate BP control . PRN intravenous antihypertensives for excessively elevated blood pressure   

## 2021-11-08 NOTE — Plan of Care (Signed)
  Problem: Education: Goal: Knowledge of General Education information will improve Description: Including pain rating scale, medication(s)/side effects and non-pharmacologic comfort measures Outcome: Progressing   Problem: Health Behavior/Discharge Planning: Goal: Ability to manage health-related needs will improve Outcome: Progressing   Problem: Clinical Measurements: Goal: Ability to maintain clinical measurements within normal limits will improve Outcome: Progressing Goal: Will remain free from infection Outcome: Progressing Goal: Diagnostic test results will improve Outcome: Progressing Goal: Respiratory complications will improve Outcome: Progressing Goal: Cardiovascular complication will be avoided Outcome: Progressing   Problem: Activity: Goal: Risk for activity intolerance will decrease Outcome: Progressing   Problem: Nutrition: Goal: Adequate nutrition will be maintained Outcome: Progressing   Problem: Coping: Goal: Level of anxiety will decrease Outcome: Progressing   Problem: Elimination: Goal: Will not experience complications related to bowel motility Outcome: Progressing Goal: Will not experience complications related to urinary retention Outcome: Progressing   Problem: Elimination: Goal: Will not experience complications related to urinary retention Outcome: Progressing   Problem: Pain Managment: Goal: General experience of comfort will improve Outcome: Progressing

## 2021-11-08 NOTE — Assessment & Plan Note (Signed)
   Patient presenting with progressively worsening shortness of breath  Shortness of breath possibly secondary to symptomatic anemia as patient's hemoglobin is currently 5.2, down from 7.7 several weeks prior  No gross bleeding upon evaluation in the emergency department however stool Hemoccult is positive  ER providers already ordered 1 unit packed red blood cell transfusion, I will add order an additional 1 unit to be transfused  We will perform serial CBCs every 6 hours to ensure stability of H/H after transfusion is complete  ER provider has reached out to Dr. Fuller Plan with gastroenterology secure chat to request consultation in the morning  We will keep patient n.p.o. after midnight in case of endoscopic intervention  Protonix 40 mg IV every 12 hours is being administered  Temporarily holding home regimen of aspirin and Pletal.

## 2021-11-08 NOTE — Assessment & Plan Note (Signed)
.   Continuing home regimen of lipid lowering therapy.  

## 2021-11-08 NOTE — Assessment & Plan Note (Signed)
   Per review of records patient is on 2 L of oxygen via nasal cannula at home  We will continue to titrate supplemental oxygen to maintain oxygen saturations between 89 to 92%.

## 2021-11-08 NOTE — Progress Notes (Signed)
PROGRESS NOTE  [name]  VHQ:[id number] DOB: 05/06/1951 DOA: 11/07/2021 PCP: Benito Mccreedy, MD   Brief Narrative:  Patient is a 71 year old female with past medical history of COPD, chronic hypoxic respiratory failure on 2 L at home, not on insulin dependent diabetes type 2, hypertension, hyperlipidemia, GERD, coronary artery disease, peripheral vascular disease, OSA, hospitalization for GI bleed from AVMs in the past who presented with a complaint of shortness of breath.  In the emergency department, she was found to have hemoglobin of 5.2.  Hemoccult was positive.  Patient was transfused with PRBCs and admitted for further evaluation and management.  Assessment & Plan:  Principal Problem:   Acute on chronic blood loss anemia Active Problems:   Acute upper gastrointestinal bleeding   Acute cardiogenic pulmonary edema (HCC)   Chronic respiratory failure with hypoxia (HCC)   Acute bacterial bronchitis   Elevated troponin level not due myocardial infarction   COPD (chronic obstructive pulmonary disease) (HCC)   Coronary artery disease involving native coronary artery of native heart without angina pectoris   Type 2 diabetes mellitus without complication, without long-term current use of insulin ([place])   Essential hypertension   Mixed diabetic hyperlipidemia associated with type 2 diabetes mellitus ([place])     Acute on chronic blood loss anemia/acute upper GI bleed: Presented with progressive worsening shortness of breath.  Hemoglobin was found to be in the range of 5 on presentation, last hemoglobin was in the range of 7 several weeks ago.  She has history of chronic GI bleed.  She was admitted last time on March this year when she presented with 2-week history of melena.  She was evaluated by GI at that time and she underwent small bowel capsule enteroscopy revealing nonbleeding AVMs.  GI consulted again here.   She was transfused with 2 units of  PRBCs,repeat Hb this mrng is  in the range of 7.Continue monitor H&H. continue Protonix.  She takes aspirin and Pletal at home  Acute on chronic hypoxic respiratory failure: Presented with shortness of breath.  On 2-3 L of oxygen at home.  Needed 3 L here.  Chest x-ray showed mild pulmonary edema.   Acute on chronic diastolic congestive heart failure: BNP is elevated.  She has history of COPD.  She was given a dose of IV Lasix.  Patient is still sounds congested, she has bilateral lower extremity edema.  We will give her another dose of Lasix 40 mg IV once.  Checking echocardiogram.  Last echo on 08/1121 showed EF of 41%, grade 2 diastolic dysfunction.  History of COPD: Currently not in frank exacerbation.  Also started on doxycycline by the admitting physician.  Continue home inhalers, bronchodilator therapy. Wean the oxygen  Elevated troponin: Secondary to supply demand ischemia, type II MI from anemia.  No chest pain.  History of coronary artery disease: Currently chest pain-free.  Takes aspirin, Pletal at home: Currently on hold  Type 2 diabetes: Recent hemoglobin A1c of 4.9.  Continue sliding-scale insulin.  Hypertension: Monitor blood pressure.  Takes antihypertensives at home.  Currently normotensive  Hyperlipidemia: Takes Lipitor  Morbid obesity: BMI of 43.7    DVT prophylaxis:SCDs Start: 11/08/21 0014     Code Status: Full Code  Family Communication: Called and discussed with daughter on phone on 5/18  Patient status: Inpatient  Patient is from : Home  Anticipated discharge to: Home  Estimated DC date: In 1 to 2 days   Consultants: GI  Procedures: None yet  Antimicrobials:  Anti-infectives (From admission, onward)    Start     Dose/Rate Route Frequency Ordered Stop   11/08/21 0600  doxycycline (VIBRAMYCIN) 100 mg in sodium chloride 0.9 % 250 mL IVPB        100 mg 125 mL/hr over 120 Minutes Intravenous 2 times daily 11/08/21 0453         Subjective: Patient seen and examined at the  bedside this morning.  Hemodynamically stable.  On 2 L of oxygen per minute.  Has some bilateral lower extremity edema.  Coughing but not in any respiratory distress.  No report of frank hematochezia or melena after hospitalization  Objective: Vitals:   11/08/21 0436 11/08/21 0506 11/08/21 0725 11/08/21 0803  BP: (!) 146/75 103/72 118/61   Pulse:      Resp: (!) 22 (!) 23 (!) 25   Temp: 98.5 F (36.9 C) 98.5 F (36.9 C) 98.5 F (36.9 C)   TempSrc: Oral Oral Oral   SpO2:   100% 100%  Weight:      Height:        Intake/Output Summary (Last 24 hours) at 11/08/2021 0840 Last data filed at 11/08/2021 0724 Gross per 24 hour  Intake 639.58 ml  Output 1000 ml  Net -360.42 ml   Filed Weights   11/08/21 0131  Weight: 101.7 kg    Examination:  General exam: Overall comfortable, not in distress, morbidly obese HEENT: PERRL Respiratory system: Mild bibasilar crackles, diminished air sounds, no wheezes  Cardiovascular system: S1 & S2 heard, RRR.  Gastrointestinal system: Abdomen is nondistended, soft and nontender. Central nervous system: Alert and oriented Extremities: 1+ bilateral lower extremity edema, no clubbing ,no cyanosis Skin: No rashes, no ulcers,no icterus     Data Reviewed: I have personally reviewed following labs and imaging studies  CBC: Recent Labs  Lab 11/07/21 1852  WBC 12.2*  HGB 5.2*  HCT 21.0*  MCV 78.1*  PLT 161*   Basic Metabolic Panel: Recent Labs  Lab 11/07/21 1852  NA 136  K 3.5  CL 104  CO2 26  GLUCOSE 111*  BUN 11  CREATININE 0.77  CALCIUM 9.4     Recent Results (from the past 240 hour(s))  Resp Panel by RT-PCR (Flu A&B, Covid) Nasopharyngeal Swab     Status: None   Collection Time: 11/07/21  5:56 PM   Specimen: Nasopharyngeal Swab; Nasopharyngeal(NP) swabs in vial transport medium  Result Value Ref Range Status   SARS Coronavirus 2 by RT PCR NEGATIVE NEGATIVE Final    Comment: (NOTE) SARS-CoV-2 target nucleic acids are NOT  DETECTED.  The SARS-CoV-2 RNA is generally detectable in upper respiratory specimens during the acute phase of infection. The lowest concentration of SARS-CoV-2 viral copies this assay can detect is 138 copies/mL. A negative result does not preclude SARS-Cov-2 infection and should not be used as the sole basis for treatment or other patient management decisions. A negative result may occur with  improper specimen collection/handling, submission of specimen other than nasopharyngeal swab, presence of viral mutation(s) within the areas targeted by this assay, and inadequate number of viral copies(<138 copies/mL). A negative result must be combined with clinical observations, patient history, and epidemiological information. The expected result is Negative.  Fact Sheet for Patients:  EntrepreneurPulse.com.au  Fact Sheet for Healthcare Providers:  IncredibleEmployment.be  This test is no t yet approved or cleared by the [place] FDA and  has been authorized for detection and/or diagnosis of SARS-CoV-2 by FDA under an Emergency Use Authorization (EUA).  This EUA will remain  in effect (meaning this test can be used) for the duration of the COVID-19 declaration under Section 564(b)(1) of the Act, 21 U.S.C.section 360bbb-3(b)(1), unless the authorization is terminated  or revoked sooner.       Influenza A by PCR NEGATIVE NEGATIVE Final   Influenza B by PCR NEGATIVE NEGATIVE Final    Comment: (NOTE) The Xpert Xpress SARS-CoV-2/FLU/RSV plus assay is intended as an aid in the diagnosis of influenza from Nasopharyngeal swab specimens and should not be used as a sole basis for treatment. Nasal washings and aspirates are unacceptable for Xpert Xpress SARS-CoV-2/FLU/RSV testing.  Fact Sheet for Patients: EntrepreneurPulse.com.au  Fact Sheet for Healthcare Providers: IncredibleEmployment.be  This test is not yet  approved or cleared by the [place] FDA and has been authorized for detection and/or diagnosis of SARS-CoV-2 by FDA under an Emergency Use Authorization (EUA). This EUA will remain in effect (meaning this test can be used) for the duration of the COVID-19 declaration under Section 564(b)(1) of the Act, 21 U.S.C. section 360bbb-3(b)(1), unless the authorization is terminated or revoked.  Performed at [facility], [place] [address], [place],  [address]      Radiology Studies: DG Chest 2 View  Result Date: 11/07/2021 CLINICAL DATA:  Shortness of breath EXAM: CHEST - 2 VIEW COMPARISON:  Chest radiograph dated 09/21/2021. FINDINGS: The heart is enlarged. Vascular calcifications are seen in the aortic arch. Moderate bilateral lower lung predominant interstitial opacities are noted. There is no pleural effusion or pneumothorax. Degenerative changes are seen in the spine. IMPRESSION: Cardiomegaly and bilateral interstitial opacities may represent congestive heart failure. Aortic Atherosclerosis (ICD10-I70.0). Electronically Signed   By: Zerita Boers M.D.   On: 11/07/2021 19:35    Scheduled Meds:  amLODipine  10 mg Oral Daily   atorvastatin  80 mg Oral Daily   ezetimibe  10 mg Oral Daily   gabapentin  600 mg Oral BID   insulin aspart  0-15 Units Subcutaneous TID AC & HS   ipratropium-albuterol  3 mL Nebulization TID   mirabegron ER  25 mg Oral Daily   pantoprazole (PROTONIX) IV  40 mg Intravenous Q12H   Continuous Infusions:  sodium chloride     doxycycline (VIBRAMYCIN) IV       LOS: 0 days   Shelly Coss, MD [facility] P5/18/2023, 8:40 AM

## 2021-11-08 NOTE — Progress Notes (Signed)
Mobility Specialist Progress Note    11/08/21 1130  Mobility  Activity Ambulated independently in hallway  Level of Assistance Standby assist, set-up cues, supervision of patient - no hands on  Assistive Device None  Distance Ambulated (ft) 250 ft  Activity Response Tolerated well  $Mobility charge 1 Mobility   During Mobility: 100% SpO2 Post-Mobility: 100% SpO2  Pt received in bed and agreeable. No complaints on walk. Returned to chair with call bell in reach.    Hildred Alamin Mobility Specialist  Primary: 5N M.S. Phone: [phone] Secondary: 6N M.S. Phone: [phone]

## 2021-11-08 NOTE — Progress Notes (Signed)
  Transition of Care [facility]) Screening Note   Patient Details  Name: [name] Date of Birth: [birth year]   Transition of Care [facility]) CM/SW Contact:    Sharin Mons, RN Phone Number: 11/08/2021, 4:46 PM   Presents with acute on chronic blood loss. From home resides with daughter [name]. PTA independent with ADL's. Home oxygen dependent, provider [employer].  Transition of Care Department [facility]) has reviewed patient and no present TOC needs have been identified. We will continue to monitor patient advancement through interdisciplinary progression rounds.  For needs. If new patient transition needs arise, please place a TOC consult.

## 2021-11-08 NOTE — Care Management Obs Status (Signed)
[place] NOTIFICATION   Patient Details  Name: [name] MRN: [id number] Date of Birth: 31-May-1951   Medicare Observation Status Notification Given:  Yes    Verdell Carmine, RN 11/08/2021, 11:55 AM

## 2021-11-08 NOTE — Assessment & Plan Note (Signed)
   Patient complaining of 2-week history of increasingly productive cough with yellow sputum  Chest x-ray does not reveal any definitive pneumonia  Patient may therefore be suffering from an acute bacterial bronchitis in the setting of advanced COPD  We will place patient on a course of intravenous doxycycline for now which can be transitioned to oral doxycycline once patient is transitioned off n.p.o.

## 2021-11-08 NOTE — Consult Note (Signed)
Reason for Consult: IDA and heme positive stools Referring Physician: [facility]  Gladyce Mazi Schuff HPI: This is a 71 year old female with multiple medical problems admitted for complaints of SOB.  Further evaluation with a CBC showed that her HGB was at 5.2 g/dL.  She received two units of PRBC and her HGB increased to 7.2 g/dL.  The patient denied any issues with hematochezia or melena, but she was noted to have heme positive stools.  She was recently evaluated for GI bleeding and a VCE was performed the beginning of April.  The VCE showed several nonbleeding AVMs scattered throughout the entire small bowel.  It was not amenable to endoscopic treatment as they were too far distally.  She is on ASA and Pletal for her PVD, but these medications were held as a result of the anemia.  Past Medical History:  Diagnosis Date   Anemia 12/26/2015   Anxiety    Arthritis    Blood transfusion without reported diagnosis    Chronic pain    resolved per pt 04/12/20   COPD (chronic obstructive pulmonary disease) (HCC)    Coronary artery disease    Depression    Diabetes mellitus without complication (HCC)    type 2   GERD (gastroesophageal reflux disease)    Heart murmur    since birth; Echo 02/18/19: LVEF 54%, grade 1 diastolic dysfunction, mild AS (mean grad 12 mmHg), trace MR/PR, mild TR, PASP 32 mmHg     Hyperlipemia    Hypertension    PVD (peripheral vascular disease) ([place])    right SFA stent 02/11/17 by Dr. Einar Gip   Sleep apnea    does not use cpap   Wears glasses     Past Surgical History:  Procedure Laterality Date   ABDOMINAL HYSTERECTOMY     CARDIAC CATHETERIZATION     CATARACT EXTRACTION     CESAREAN SECTION     COLONOSCOPY N/A 01/08/2013   Procedure: COLONOSCOPY;  Surgeon: Beryle Beams, MD;  Location: [facility];  Service: Endoscopy;  Laterality: N/A;   COLONOSCOPY N/A 12/28/2015   Procedure: COLONOSCOPY;  Surgeon: Carol Ada, MD;  Location: [place];  Service:  Endoscopy;  Laterality: N/A;   COLONOSCOPY WITH PROPOFOL N/A 10/05/2020   Procedure: COLONOSCOPY WITH PROPOFOL;  Surgeon: Carol Ada, MD;  Location: [facility];  Service: Endoscopy;  Laterality: N/A;   ENTEROSCOPY N/A 12/28/2015   Procedure: ENTEROSCOPY;  Surgeon: Carol Ada, MD;  Location: [place];  Service: Endoscopy;  Laterality: N/A;   ENTEROSCOPY N/A 02/20/2018   Procedure: ENTEROSCOPY;  Surgeon: Carol Ada, MD;  [facility];  Service: Endoscopy;  Laterality: N/A;   ENTEROSCOPY N/A 03/27/2018   Procedure: ENTEROSCOPY;  Surgeon: Carol Ada, MD;  [facility];  Service: Endoscopy;  Laterality: N/A;   ENTEROSCOPY N/A 10/05/2020   Procedure: ENTEROSCOPY;  Surgeon: Carol Ada, MD;  [facility];  Service: Endoscopy;  Laterality: N/A;   ENTEROSCOPY N/A 05/11/2021   Procedure: ENTEROSCOPY;  Surgeon: Carol Ada, MD;  [facility];  Service: Endoscopy;  Laterality: N/A;   ENTEROSCOPY N/A 09/23/2021   Procedure: ENTEROSCOPY;  Surgeon: Doran Stabler, MD;  Location: [place];  Service: Gastroenterology;  Laterality: N/A;   ESOPHAGOGASTRODUODENOSCOPY N/A 01/08/2013   Procedure: ESOPHAGOGASTRODUODENOSCOPY (EGD);  Surgeon: Beryle Beams, MD;  Location: [facility];  Service: Endoscopy;  Laterality: N/A;   EYE SURGERY     GIVENS CAPSULE STUDY N/A 09/24/2021   Procedure: GIVENS CAPSULE STUDY;  Surgeon: Benson Norway,  Saralyn Pilar, MD;  Location: [place];  Service: Gastroenterology;  Laterality: N/A;   HAND SURGERY     HEMOSTASIS CLIP PLACEMENT  05/11/2021   Procedure: HEMOSTASIS CLIP PLACEMENT;  Surgeon: Carol Ada, MD;  [facility];  Service: Endoscopy;;   HOT HEMOSTASIS N/A 12/28/2015   Procedure: HOT HEMOSTASIS (ARGON PLASMA COAGULATION/BICAP);  Surgeon: Carol Ada, MD;  Location: [facility] ENDOSCOPY;  Service: Endoscopy;  Laterality: N/A;   HOT HEMOSTASIS N/A 02/20/2018   Procedure: HOT HEMOSTASIS (ARGON PLASMA COAGULATION/BICAP);   Surgeon: Carol Ada, MD;  Location: [facility];  Service: Endoscopy;  Laterality: N/A;   HOT HEMOSTASIS N/A 03/27/2018   Procedure: HOT HEMOSTASIS (ARGON PLASMA COAGULATION/BICAP);  Surgeon: Carol Ada, MD;  Location: [facility];  Service: Endoscopy;  Laterality: N/A;   HOT HEMOSTASIS N/A 10/05/2020   Procedure: HOT HEMOSTASIS (ARGON PLASMA COAGULATION/BICAP);  Surgeon: Carol Ada, MD;  Location: [facility];  Service: Endoscopy;  Laterality: N/A;   HOT HEMOSTASIS N/A 05/11/2021   Procedure: HOT HEMOSTASIS (ARGON PLASMA COAGULATION/BICAP);  Surgeon: Carol Ada, MD;  Location: [facility];  Service: Endoscopy;  Laterality: N/A;   LEFT HEART CATHETERIZATION WITH CORONARY ANGIOGRAM N/A 03/09/2013   Procedure: LEFT HEART CATHETERIZATION WITH CORONARY ANGIOGRAM;  Surgeon: Laverda Page, MD;  Location: [facility] CATH LAB;  Service: Cardiovascular;  Laterality: N/A;   LOWER EXTREMITY ANGIOGRAM N/A 12/29/2012   Procedure: LOWER EXTREMITY ANGIOGRAM;  Surgeon: Laverda Page, MD;  Location: [facility] C[facility] LAB;  Service: Cardiovascular;  Laterality: N/A;   LOWER EXTREMITY ANGIOGRAM N/A 10/05/2013   Procedure: LOWER EXTREMITY ANGIOGRAM;  Surgeon: Laverda Page, MD;  Location: [facility] CATH LAB;  Service: Cardiovascular;  Laterality: N/A;   LOWER EXTREMITY ANGIOGRAPHY N/A 02/11/2017   Procedure: Lower Extremity Angiography;  Surgeon: Adrian Prows, MD;  Location: [place] CV LAB;  Service: Cardiovascular;  Laterality: N/A;   PERIPHERAL VASCULAR BALLOON ANGIOPLASTY  02/11/2017   Procedure: PERIPHERAL VASCULAR BALLOON ANGIOPLASTY;  Surgeon: Adrian Prows, MD;  Location: [place] CV LAB;  Service: Cardiovascular;;  Right SFA   PERIPHERAL VASCULAR INTERVENTION Right 02/11/2017   Procedure: PERIPHERAL VASCULAR INTERVENTION;  Surgeon: Adrian Prows, MD;  Location: [place] CV LAB;  Service: Cardiovascular;  Laterality: Right;  Rt SFA   POLYPECTOMY  10/05/2020   Procedure: POLYPECTOMY;  Surgeon: Carol Ada, MD;   Location: [facility];  Service: Endoscopy;;   stent in right leg  12/29/2012   for blood clot, Dr. Nadyne Coombes   TOTAL KNEE ARTHROPLASTY Left 04/18/2020   TOTAL KNEE ARTHROPLASTY Left 04/18/2020   Procedure: LEFT TOTAL KNEE ARTHROPLASTY;  Surgeon: Meredith Pel, MD;  Location: [place];  Service: Orthopedics;  Laterality: Left;   TYMPANOMASTOIDECTOMY Left 03/08/2014   Procedure: TYMPANOMASTOIDECTOMY LEFT;  Surgeon: Ascencion Dike, MD;  Location: [place];  Service: ENT;  Laterality: Left;   TYMPANOMASTOIDECTOMY  2015   UTERINE FIBROID SURGERY      Family History  Problem Relation Age of Onset   Diabetes Mother    Hypertension Mother    Heart disease Mother    Heart disease Father    Hypertension Father    Hypertension Sister    Hypertension Brother    Diabetes Brother    Hypertension Sister    Hypertension Brother    Diabetes Brother    Hypertension Brother    Hypertension Brother    Hypertension Brother     Social History:  reports that she has quit smoking. Her smoking use included cigarettes. She has a 20.50 pack-year smoking  history. She has never used smokeless tobacco. She reports current alcohol use. She reports current drug use. Drugs: Marijuana and Cocaine.  Allergies: No Known Allergies  Medications: Scheduled:  atorvastatin  80 mg Oral Daily   ezetimibe  10 mg Oral Daily   gabapentin  600 mg Oral BID   insulin aspart  0-15 Units Subcutaneous TID AC & HS   ipratropium-albuterol  3 mL Nebulization TID   mirabegron ER  25 mg Oral Daily   pantoprazole (PROTONIX) IV  40 mg Intravenous Q12H   Continuous:  sodium chloride     doxycycline (VIBRAMYCIN) IV 100 mg (11/08/21 0930)    Results for orders placed or performed during the hospital encounter of 11/07/21 (from the past 24 hour(s))  Resp Panel by RT-PCR (Flu A&B, Covid) Nasopharyngeal Swab     Status: None   Collection Time: 11/07/21  5:56 PM   Specimen: Nasopharyngeal Swab; Nasopharyngeal(NP) swabs  in vial transport medium  Result Value Ref Range   SARS Coronavirus 2 by RT PCR NEGATIVE NEGATIVE   Influenza A by PCR NEGATIVE NEGATIVE   Influenza B by PCR NEGATIVE NEGATIVE  CBC     Status: Abnormal   Collection Time: 11/07/21  6:52 PM  Result Value Ref Range   WBC 12.2 (H) 4.0 - 10.5 K/uL   RBC 2.69 (L) 3.87 - 5.11 MIL/uL   Hemoglobin 5.2 (LL) 12.0 - 15.0 g/dL   HCT 21.0 (L) 36.0 - 46.0 %   MCV 78.1 (L) 80.0 - 100.0 fL   MCH 19.3 (L) 26.0 - 34.0 pg   MCHC 24.8 (L) 30.0 - 36.0 g/dL   RDW 20.2 (H) 11.5 - 15.5 %   Platelets 444 (H) 150 - 400 K/uL   nRBC 0.7 (H) 0.0 - 0.2 %  Basic metabolic panel     Status: Abnormal   Collection Time: 11/07/21  6:52 PM  Result Value Ref Range   Sodium 136 135 - 145 mmol/L   Potassium 3.5 3.5 - 5.1 mmol/L   Chloride 104 98 - 111 mmol/L   CO2 26 22 - 32 mmol/L   Glucose, Bld 111 (H) 70 - 99 mg/dL   BUN 11 8 - 23 mg/dL   Creatinine, Ser 0.77 0.44 - 1.00 mg/dL   Calcium 9.4 8.9 - 10.3 mg/dL   GFR, Estimated >60 >60 mL/min   Anion gap 6 5 - 15  Troponin I (High Sensitivity)     Status: None   Collection Time: 11/07/21  6:52 PM  Result Value Ref Range   Troponin I (High Sensitivity) 17 <18 ng/L  Brain natriuretic peptide     Status: None   Collection Time: 11/07/21  7:33 PM  Result Value Ref Range   B Natriuretic Peptide 84.2 0.0 - 100.0 pg/mL  Hepatic function panel     Status: Abnormal   Collection Time: 11/07/21  7:33 PM  Result Value Ref Range   Total Protein 7.5 6.5 - 8.1 g/dL   Albumin 3.4 (L) 3.5 - 5.0 g/dL   AST 16 15 - 41 U/L   ALT 10 0 - 44 U/L   Alkaline Phosphatase 61 38 - 126 U/L   Total Bilirubin 0.5 0.3 - 1.2 mg/dL   Bilirubin, Direct <0.1 0.0 - 0.2 mg/dL   Indirect Bilirubin NOT CALCULATED 0.3 - 0.9 mg/dL  Type and screen Amanda     Status: None (Preliminary result)   Collection Time: 11/07/21  8:20 PM  Result Value Ref Range  ABO/RH(D) O POS    Antibody Screen POS    Sample Expiration  11/10/2021,2359    Antibody Identification      ANTI K Performed at [facility], [place] [address] [place], [place] [address]    Unit Number [id number]    Blood Component Type RED CELLS,LR    Unit division 00    Status of Unit ISSUED    Transfusion Status OK TO TRANSFUSE    Crossmatch Result COMPATIBLE    Donor AG Type NEGATIVE FOR KELL ANTIGEN    Unit Number [id number]    Blood Component Type RED CELLS,LR    Unit division 00    Status of Unit ISSUED    Transfusion Status OK TO TRANSFUSE    Crossmatch Result COMPATIBLE    Donor AG Type NEGATIVE FOR KELL ANTIGEN    Unit Number [id number]    Blood Component Type RED CELLS,LR    Unit division 00    Status of Unit ALLOCATED    Transfusion Status OK TO TRANSFUSE    Crossmatch Result COMPATIBLE    Donor AG Type NEGATIVE FOR KELL ANTIGEN    Unit Number [id number]    Blood Component Type RED CELLS,LR    Unit division 00    Status of Unit ALLOCATED    Transfusion Status OK TO TRANSFUSE    Crossmatch Result COMPATIBLE    Donor AG Type NEGATIVE FOR KELL ANTIGEN   Troponin I (High Sensitivity)     Status: Abnormal   Collection Time: 11/07/21  9:20 PM  Result Value Ref Range   Troponin I (High Sensitivity) 18 (H) <18 ng/L  POC occult blood, ED     Status: Abnormal   Collection Time: 11/07/21  9:24 PM  Result Value Ref Range   Fecal Occult Bld POSITIVE (A) NEGATIVE  Prepare RBC (crossmatch)     Status: None   Collection Time: 11/07/21 10:30 PM  Result Value Ref Range   Order Confirmation      ORDER PROCESSED BY BLOOD BANK Performed at [facility], [address], [place], [place] [address]   Prepare RBC (crossmatch)     Status: None   Collection Time: 11/08/21  1:00 AM  Result Value Ref Range   Order Confirmation      ORDER PROCESSED BY BLOOD BANK Performed at [facility], [place] [address] [place], [place] [address]   Glucose, capillary     Status: Abnormal   Collection Time: 11/08/21  1:09 AM   Result Value Ref Range   Glucose-Capillary 160 (H) 70 - 99 mg/dL  Glucose, capillary     Status: Abnormal   Collection Time: 11/08/21  7:23 AM  Result Value Ref Range   Glucose-Capillary 138 (H) 70 - 99 mg/dL  Comprehensive metabolic panel     Status: Abnormal   Collection Time: 11/08/21  9:37 AM  Result Value Ref Range   Sodium 136 135 - 145 mmol/L   Potassium 3.7 3.5 - 5.1 mmol/L   Chloride 103 98 - 111 mmol/L   CO2 26 22 - 32 mmol/L   Glucose, Bld 112 (H) 70 - 99 mg/dL   BUN 11 8 - 23 mg/dL   Creatinine, Ser 0.69 0.44 - 1.00 mg/dL   Calcium 8.9 8.9 - 10.3 mg/dL   Total Protein 6.7 6.5 - 8.1 g/dL   Albumin 3.2 (L) 3.5 - 5.0 g/dL   AST 13 (L) 15 - 41 U/L   ALT 8 0 - 44  U/L   Alkaline Phosphatase 53 38 - 126 U/L   Total Bilirubin 0.9 0.3 - 1.2 mg/dL   GFR, Estimated >60 >60 mL/min   Anion gap 7 5 - 15  Magnesium     Status: None   Collection Time: 11/08/21  9:37 AM  Result Value Ref Range   Magnesium 1.7 1.7 - 2.4 mg/dL  APTT     Status: None   Collection Time: 11/08/21  9:37 AM  Result Value Ref Range   aPTT 27 24 - 36 seconds  Protime-INR     Status: None   Collection Time: 11/08/21  9:37 AM  Result Value Ref Range   Prothrombin Time 14.6 11.4 - 15.2 seconds   INR 1.2 0.8 - 1.2  Brain natriuretic peptide     Status: Abnormal   Collection Time: 11/08/21  9:37 AM  Result Value Ref Range   B Natriuretic Peptide 217.8 (H) 0.0 - 100.0 pg/mL  Hemoglobin and hematocrit, blood     Status: Abnormal   Collection Time: 11/08/21  9:37 AM  Result Value Ref Range   Hemoglobin 7.2 (L) 12.0 - 15.0 g/dL   HCT 24.5 (L) 36.0 - 46.0 %  Glucose, capillary     Status: Abnormal   Collection Time: 11/08/21 11:40 AM  Result Value Ref Range   Glucose-Capillary 124 (H) 70 - 99 mg/dL     DG Chest 2 View  Result Date: 11/07/2021 CLINICAL DATA:  Shortness of breath EXAM: CHEST - 2 VIEW COMPARISON:  Chest radiograph dated 09/21/2021. FINDINGS: The heart is enlarged. Vascular  calcifications are seen in the aortic arch. Moderate bilateral lower lung predominant interstitial opacities are noted. There is no pleural effusion or pneumothorax. Degenerative changes are seen in the spine. IMPRESSION: Cardiomegaly and bilateral interstitial opacities may represent congestive heart failure. Aortic Atherosclerosis (ICD10-I70.0). Electronically Signed   By: Zerita Boers M.D.   On: 11/07/2021 19:35    ROS:  As stated above in the HPI otherwise negative.  Blood pressure 118/61, pulse 97, temperature 98.5 F (36.9 C), temperature source Oral, resp. rate (!) 25, height 5' (1.524 m), weight 101.7 kg, SpO2 100 %.    PE: Gen: NAD, Alert and Oriented HEENT:  [place]/AT, EOMI Neck: Supple, no LAD Lungs: CTA Bilaterally CV: RRR without M/G/R ABD: Soft, NTND, +BS Ext: No C/C/E  Assessment/Plan: 1) IDA.Marland Kitchen 2) Small bowel AVMs. 3) PVD.   The patient has a long history of GI bleeding.  The AVMs were noted on the most recent VCE, but the were too far to be reached.  She does require some form of anticoagulation, but this puts her at risk for rebleeding.  Plan: 1) Monitor HGB and transfuse as necessary. 2) No need for pantoprazole. 3) Minimize anticoagulation if possible. 4) Outpatient referral to [facility] for spiral enteroscopy. 5) Advance to a low salt diet.  Warnie Belair D 11/08/2021, 1:28 PM

## 2021-11-08 NOTE — Assessment & Plan Note (Signed)
?   Patient been placed on Accu-Cheks before every meal and nightly with sliding scale insulin ?? Holding home regimen of hypoglycemics ?? Hemoglobin A1C 4.9% 08/2021 ?? Once NPO discontinued will place on Diabetic Diet ? ?

## 2021-11-08 NOTE — Assessment & Plan Note (Addendum)
   Evidence of mild pulmonary edema on chest x-ray  Possibly multifactorial thought to be secondary to symptomatic anemia in addition to a degree of cardiogenic pulm edema possibly from transient from high-output heart failure   less likely due to an exacerbation of COPD  We will administer a dose of intravenous Lasix in addition to correcting anemia with blood transfusion  We will concurrently provide patient with as needed bronchodilator therapy  Monitor for symptomatic improvement

## 2021-11-08 NOTE — Assessment & Plan Note (Signed)
?   Patient is currently chest pain free ?? Monitoring patient on telemetry ?? Temporarily holding antiplatelet therapy ?? Continue home regimen of lipid lowering therapy ? ?

## 2021-11-08 NOTE — Plan of Care (Signed)

## 2021-11-08 NOTE — Assessment & Plan Note (Signed)
   Minimally elevated troponin  Patient is currently chest pain-free  No evidence of ST segment change on EKG  Minimally elevated troponin likely secondary to supply/demand mismatch, unlikely to be secondary to plaque rupture.

## 2021-11-09 ENCOUNTER — Inpatient Hospital Stay (HOSPITAL_COMMUNITY): Payer: Medicare Other

## 2021-11-09 DIAGNOSIS — D62 Acute posthemorrhagic anemia: Secondary | ICD-10-CM | POA: Diagnosis not present

## 2021-11-09 LAB — BASIC METABOLIC PANEL
Anion gap: 10 (ref 5–15)
BUN: 14 mg/dL (ref 8–23)
CO2: 26 mmol/L (ref 22–32)
Calcium: 8.9 mg/dL (ref 8.9–10.3)
Chloride: 103 mmol/L (ref 98–111)
Creatinine, Ser: 0.76 mg/dL (ref 0.44–1.00)
GFR, Estimated: >60 mL/min (ref >60)
Glucose, Bld: 125 mg/dL — ABNORMAL HIGH (ref 70–99)
Potassium: 3.9 mmol/L (ref 3.5–5.1)
Sodium: 139 mmol/L (ref 135–145)

## 2021-11-09 LAB — HEMOGLOBIN AND HEMATOCRIT, BLOOD
HCT: 29.3 % — ABNORMAL LOW (ref 36.0–46.0)
Hemoglobin: 7.9 g/dL — ABNORMAL LOW (ref 12.0–15.0)

## 2021-11-09 LAB — GLUCOSE, CAPILLARY
Glucose-Capillary: 154 mg/dL — ABNORMAL HIGH (ref 70–99)
Glucose-Capillary: 67 mg/dL — ABNORMAL LOW (ref 70–99)
Glucose-Capillary: 155 mg/dL — ABNORMAL HIGH (ref 70–99)
Glucose-Capillary: 162 mg/dL — ABNORMAL HIGH (ref 70–99)

## 2021-11-09 MED ORDER — METHYLPREDNISOLONE SODIUM SUCC 125 MG IJ SOLR
80.0000 mg | INTRAMUSCULAR | Status: DC
  Administered 2021-11-09 – 2021-11-12 (×4): 80 mg via INTRAVENOUS
  Filled 2021-11-09 (×4): qty 2

## 2021-11-09 MED ORDER — IPRATROPIUM-ALBUTEROL 0.5-2.5 (3) MG/3ML IN SOLN
3.0000 mL | RESPIRATORY_TRACT | Status: DC | PRN
  Administered 2021-11-11: 3 mL via RESPIRATORY_TRACT
  Filled 2021-11-09: qty 3

## 2021-11-09 MED ORDER — DOXYCYCLINE HYCLATE 100 MG PO TABS
100.0000 mg | ORAL_TABLET | Freq: Two times a day (BID) | ORAL | Status: DC
Start: 2021-11-09 — End: 2021-11-13
  Administered 2021-11-09 – 2021-11-13 (×9): 100 mg via ORAL
  Filled 2021-11-09 (×9): qty 1

## 2021-11-09 MED ORDER — FUROSEMIDE 10 MG/ML IJ SOLN
40.0000 mg | Freq: Once | INTRAMUSCULAR | Status: AC
  Administered 2021-11-09: 40 mg via INTRAVENOUS
  Filled 2021-11-09: qty 4

## 2021-11-09 MED ORDER — DM-GUAIFENESIN ER 30-600 MG PO TB12
1.0000 | ORAL_TABLET | Freq: Once | ORAL | Status: AC
  Administered 2021-11-09: 1 via ORAL
  Filled 2021-11-09: qty 1

## 2021-11-09 MED ORDER — GUAIFENESIN ER 600 MG PO TB12
600.0000 mg | ORAL_TABLET | Freq: Two times a day (BID) | ORAL | Status: DC
  Administered 2021-11-09 – 2021-11-10 (×3): 600 mg via ORAL
  Filled 2021-11-09 (×3): qty 1

## 2021-11-09 NOTE — Progress Notes (Signed)
PROGRESS NOTE  [name]  FTD:[id number] DOB: 03-09-1951 DOA: 11/07/2021 PCP: Benito Mccreedy, MD   Brief Narrative:  Patient is a 71 year old female with past medical history of COPD, chronic hypoxic respiratory failure on 2 L at home, not on insulin dependent diabetes type 2, hypertension, hyperlipidemia, GERD, coronary artery disease, peripheral vascular disease, OSA, hospitalization for GI bleed from AVMs in the past who presented with a complaint of shortness of breath.  In the emergency department, she was found to have hemoglobin of 5.2.  Hemoccult was positive.  Patient was transfused with PRBCs and admitted for further evaluation and management.  Hospital course also remarkable for fluid overload, COPD exacerbation.  Assessment & Plan:  Principal Problem:   Acute on chronic blood loss anemia Active Problems:   Acute upper gastrointestinal bleeding   Acute cardiogenic pulmonary edema (HCC)   Chronic respiratory failure with hypoxia (HCC)   Acute bacterial bronchitis   Elevated troponin level not due myocardial infarction   COPD (chronic obstructive pulmonary disease) (HCC)   Coronary artery disease involving native coronary artery of native heart without angina pectoris   Type 2 diabetes mellitus without complication, without long-term current use of insulin ([place])   Essential hypertension   Mixed diabetic hyperlipidemia associated with type 2 diabetes mellitus (HCC)   GI bleed     Acute on chronic blood loss anemia/acute upper GI bleed: Presented with progressive worsening shortness of breath.  Hemoglobin was found to be in the range of 5 on presentation, last hemoglobin was in the range of 7 several weeks ago.  She has history of chronic GI bleed.  She was admitted last time on March this year when she presented with 2-week history of melena.  She was evaluated by GI at that time and she underwent small bowel capsule endoscopy revealing nonbleeding AVMs.  GI  consulted again here.  GI is not planning for further work-up here and recommended outpatient follow-up with [facility]. She was transfused with 2 units of  PRBCs, Hb this mrng is in the range of 7.Continue monitor H&H.She takes aspirin and Pletal at home which we will resume on dc  Acute on chronic hypoxic respiratory failure: Presented with shortness of breath.  On 2-3 L of oxygen at home.  Needed 3 L here.  Chest x-ray earlier showed mild pulmonary edema. Chest x-ray done on 5/19 did not show significant pulmonary edema.  Acute on chronic diastolic congestive heart failure: BNP is elevated.  Also had lower extremity edema which improved with IV Lasix.  Given intermittent dose of IV Lasix.  Last echo on 09/01/21 showed EF of 06%, grade 2 diastolic dysfunction.  New echo done here showed EF of 55 to 23%, grade 1 diastolic dysfunction, diminished right ventricular function, severe elevated pulmonary artery pressure  History of COPD: Wheezing today, started on Solu-Medrol.  Continue bronchodilators.  Elevated troponin: Secondary to supply demand ischemia, type II MI from anemia.  No chest pain.  History of coronary artery disease: Currently chest pain-free.  Takes aspirin, Pletal at home: Currently on hold  Type 2 diabetes: Recent hemoglobin A1c of 4.9.  Continue sliding-scale insulin.  Hypertension: Monitor blood pressure.  Takes antihypertensives at home.  Currently normotensive  Hyperlipidemia: Takes Lipitor  Morbid obesity: BMI of 43.7    DVT prophylaxis:SCDs Start: 11/08/21 0014     Code Status: Full Code  Family Communication: Called and discussed with daughter on phone on 5/18  Patient status: Inpatient  Patient is from : Home  Anticipated discharge to: Home  Estimated DC date: In 1 to 2 days   Consultants: GI  Procedures: None yet  Antimicrobials:  Anti-infectives (From admission, onward)    Start     Dose/Rate Route Frequency Ordered Stop   11/09/21 1215   doxycycline (VIBRA-TABS) tablet 100 mg        100 mg Oral Every 12 hours 11/09/21 1121     11/08/21 0600  doxycycline (VIBRAMYCIN) 100 mg in sodium chloride 0.9 % 250 mL IVPB  Status:  Discontinued        100 mg 125 mL/hr over 120 Minutes Intravenous 2 times daily 11/08/21 0453 11/09/21 1121       Subjective: Patient seen and examined at the bedside this morning.  Current issue is breathing.  She was wheezing during my evaluation.  Also appeared congested but chest x-ray did not show any frank pulmonary edema.  No report of bloody bowel movement or melena.  Objective: Vitals:   11/09/21 0135 11/09/21 0400 11/09/21 0757 11/09/21 0806  BP:  (!) 141/82 119/74   Pulse:  86 89 94  Resp:  20 20 (!) 29  Temp:  98.7 F (37.1 C) 98 F (36.7 C)   TempSrc:  Oral    SpO2: 98% 99% 95% 94%  Weight:      Height:        Intake/Output Summary (Last 24 hours) at 11/09/2021 1121 Last data filed at 11/09/2021 0500 Gross per 24 hour  Intake 980 ml  Output 1050 ml  Net -70 ml   Filed Weights   11/08/21 0131  Weight: 101.7 kg    Examination:  General exam: Overall comfortable, not in distress, obese HEENT: PERRL Respiratory system: Diminished sounds, bilateral wheezing Cardiovascular system: S1 & S2 heard, RRR.  Gastrointestinal system: Abdomen is nondistended, soft and nontender. Central nervous system: Alert and oriented Extremities: No edema, no clubbing ,no cyanosis Skin: No rashes, no ulcers,no icterus     Data Reviewed: I have personally reviewed following labs and imaging studies  CBC: Recent Labs  Lab 11/07/21 1852 11/08/21 0937 11/08/21 2157 11/09/21 1019  WBC 12.2*  --   --   --   HGB 5.2* 7.2* 7.5* 7.9*  HCT 21.0* 24.5* 25.9* 29.3*  MCV 78.1*  --   --   --   PLT 444*  --   --   --    Basic Metabolic Panel: Recent Labs  Lab 11/07/21 1852 11/08/21 0937  NA 136 136  K 3.5 3.7  CL 104 103  CO2 26 26  GLUCOSE 111* 112*  BUN 11 11  CREATININE 0.77 0.69   CALCIUM 9.4 8.9  MG  --  1.7     Recent Results (from the past 240 hour(s))  Resp Panel by RT-PCR (Flu A&B, Covid) Nasopharyngeal Swab     Status: None   Collection Time: 11/07/21  5:56 PM   Specimen: Nasopharyngeal Swab; Nasopharyngeal(NP) swabs in vial transport medium  Result Value Ref Range Status   SARS Coronavirus 2 by RT PCR NEGATIVE NEGATIVE Final    Comment: (NOTE) SARS-CoV-2 target nucleic acids are NOT DETECTED.  The SARS-CoV-2 RNA is generally detectable in upper respiratory specimens during the acute phase of infection. The lowest concentration of SARS-CoV-2 viral copies this assay can detect is 138 copies/mL. A negative result does not preclude SARS-Cov-2 infection and should not be used as the sole basis for treatment or other patient management decisions. A negative result may occur with  improper  specimen collection/handling, submission of specimen other than nasopharyngeal swab, presence of viral mutation(s) within the areas targeted by this assay, and inadequate number of viral copies(<138 copies/mL). A negative result must be combined with clinical observations, patient history, and epidemiological information. The expected result is Negative.  Fact Sheet for Patients:  EntrepreneurPulse.com.au  Fact Sheet for Healthcare Providers:  IncredibleEmployment.be  This test is no t yet approved or cleared by the [place] FDA and  has been authorized for detection and/or diagnosis of SARS-CoV-2 by FDA under an Emergency Use Authorization (EUA). This EUA will remain  in effect (meaning this test can be used) for the duration of the COVID-19 declaration under Section 564(b)(1) of the Act, 21 U.S.C.section 360bbb-3(b)(1), unless the authorization is terminated  or revoked sooner.       Influenza A by PCR NEGATIVE NEGATIVE Final   Influenza B by PCR NEGATIVE NEGATIVE Final    Comment: (NOTE) The Xpert Xpress  SARS-CoV-2/FLU/RSV plus assay is intended as an aid in the diagnosis of influenza from Nasopharyngeal swab specimens and should not be used as a sole basis for treatment. Nasal washings and aspirates are unacceptable for Xpert Xpress SARS-CoV-2/FLU/RSV testing.  Fact Sheet for Patients: EntrepreneurPulse.com.au  Fact Sheet for Healthcare Providers: IncredibleEmployment.be  This test is not yet approved or cleared by the [place] FDA and has been authorized for detection and/or diagnosis of SARS-CoV-2 by FDA under an Emergency Use Authorization (EUA). This EUA will remain in effect (meaning this test can be used) for the duration of the COVID-19 declaration under Section 564(b)(1) of the Act, 21 U.S.C. section 360bbb-3(b)(1), unless the authorization is terminated or revoked.  Performed at [facility], [place] [address] [place], [place] [address]      Radiology Studies: DG Chest 2 View  Result Date: 11/07/2021 CLINICAL DATA:  Shortness of breath EXAM: CHEST - 2 VIEW COMPARISON:  Chest radiograph dated 09/21/2021. FINDINGS: The heart is enlarged. Vascular calcifications are seen in the aortic arch. Moderate bilateral lower lung predominant interstitial opacities are noted. There is no pleural effusion or pneumothorax. Degenerative changes are seen in the spine. IMPRESSION: Cardiomegaly and bilateral interstitial opacities may represent congestive heart failure. Aortic Atherosclerosis (ICD10-I70.0). Electronically Signed   By: Zerita Boers M.D.   On: 11/07/2021 19:35   DG CHEST PORT 1 VIEW  Result Date: 11/09/2021 CLINICAL DATA:  Shortness of breath, cough EXAM: PORTABLE CHEST 1 VIEW COMPARISON:  11/07/2021 FINDINGS: Cardiomegaly. No frank interstitial edema. No pleural effusion or pneumothorax. Thoracic aortic atherosclerosis. IMPRESSION: Cardiomegaly. No frank interstitial edema. Electronically Signed   By: Julian Hy M.D.   On:  11/09/2021 02:04   ECHOCARDIOGRAM COMPLETE  Result Date: 11/08/2021    ECHOCARDIOGRAM REPORT   Patient Name:   ANGINETTE ESPEJO Morea Date of Exam: 11/08/2021 Medical Rec #:  [id number]             Height:       60.0 in Accession #:    [id number]            Weight:       224.2 lb Date of Birth:  July 05, 1950             BSA:          1.959 m Patient Age:    41 years              BP:           118/61 mmHg Patient Gender: F  HR:           84 bpm. Exam Location:  Inpatient Procedure: 2D Echo, Cardiac Doppler and Color Doppler Indications:    CHF  History:        Patient has no prior history of Echocardiogram examinations and                 Patient has prior history of Echocardiogram examinations, most                 recent 08/29/2021. CAD, COPD; Risk Factors:Hypertension and                 Diabetes.  Sonographer:    Jefferey Pica Referring Phys: [id number] Chasin Findling IMPRESSIONS  1. Left ventricular ejection fraction, by estimation, is 55 to 60%. The left ventricle has normal function. The left ventricle has no regional wall motion abnormalities. Left ventricular diastolic parameters are consistent with Grade I diastolic dysfunction (impaired relaxation).  2. Right ventricular systolic function is moderately reduced. The right ventricular size is moderately enlarged. There is severely elevated pulmonary artery systolic pressure.  3. Right atrial size was moderately dilated.  4. The mitral valve is abnormal. Trivial mitral valve regurgitation. No evidence of mitral stenosis.  5. Tricuspid valve regurgitation is mild to moderate.  6. Gradients stable to slightly lower compared to TTE done 08/29/21. The aortic valve is tricuspid. There is moderate calcification of the aortic valve. There is moderate thickening of the aortic valve. Aortic valve regurgitation is not visualized. Mild aortic valve stenosis.  7. Aortic dilatation noted. There is dilatation of the ascending aorta, measuring 43 mm.  8. The  inferior vena cava is dilated in size with >50% respiratory variability, suggesting right atrial pressure of 8 mmHg. FINDINGS  Left Ventricle: Left ventricular ejection fraction, by estimation, is 55 to 60%. The left ventricle has normal function. The left ventricle has no regional wall motion abnormalities. The left ventricular internal cavity size was normal in size. There is  no left ventricular hypertrophy. Left ventricular diastolic parameters are consistent with Grade I diastolic dysfunction (impaired relaxation). Right Ventricle: The right ventricular size is moderately enlarged. No increase in right ventricular wall thickness. Right ventricular systolic function is moderately reduced. There is severely elevated pulmonary artery systolic pressure. The tricuspid regurgitant velocity is 3.37 m/s, and with an assumed right atrial pressure of 15 mmHg, the estimated right ventricular systolic pressure is 45.4 mmHg. Left Atrium: Left atrial size was normal in size. Right Atrium: Right atrial size was moderately dilated. Pericardium: There is no evidence of pericardial effusion. Mitral Valve: The mitral valve is abnormal. There is mild thickening of the mitral valve leaflet(s). There is mild calcification of the mitral valve leaflet(s). Trivial mitral valve regurgitation. No evidence of mitral valve stenosis. Tricuspid Valve: The tricuspid valve is normal in structure. Tricuspid valve regurgitation is mild to moderate. No evidence of tricuspid stenosis. Aortic Valve: Gradients stable to slightly lower compared to TTE done 08/29/21. The aortic valve is tricuspid. There is moderate calcification of the aortic valve. There is moderate thickening of the aortic valve. Aortic valve regurgitation is not visualized. Mild aortic stenosis is present. Aortic valve mean gradient measures 12.0 mmHg. Aortic valve peak gradient measures 16.1 mmHg. Aortic valve area, by VTI measures 1.69 cm. Pulmonic Valve: The pulmonic valve was  normal in structure. Pulmonic valve regurgitation is mild. No evidence of pulmonic stenosis. Aorta: Aortic dilatation noted. There is dilatation of the ascending aorta, measuring 43 mm. Venous:  The inferior vena cava is dilated in size with greater than 50% respiratory variability, suggesting right atrial pressure of 8 mmHg. IAS/Shunts: The interatrial septum was not well visualized.  LEFT VENTRICLE PLAX 2D LVIDd:         4.20 cm   Diastology LVIDs:         3.05 cm   LV e' medial:    5.57 cm/s LV PW:         1.10 cm   LV E/e' medial:  14.1 LV IVS:        0.95 cm   LV e' lateral:   7.72 cm/s LVOT diam:     1.80 cm   LV E/e' lateral: 10.2 LV SV:         79 LV SV Index:   41 LVOT Area:     2.54 cm  RIGHT VENTRICLE             IVC RV S prime:     12.10 cm/s  IVC diam: 2.50 cm TAPSE (M-mode): 2.0 cm LEFT ATRIUM             Index        RIGHT ATRIUM           Index LA diam:        3.40 cm 1.74 cm/m   RA Area:     12.90 cm LA Vol (A2C):   39.4 ml 20.11 ml/m  RA Volume:   31.90 ml  16.28 ml/m LA Vol (A4C):   44.5 ml 22.71 ml/m LA Biplane Vol: 42.2 ml 21.54 ml/m  AORTIC VALVE                     PULMONIC VALVE AV Area (Vmax):    1.98 cm      PV Vmax:       1.17 m/s AV Area (Vmean):   1.76 cm      PV Peak grad:  5.5 mmHg AV Area (VTI):     1.69 cm AV Vmax:           200.68 cm/s AV Vmean:          159.000 cm/s AV VTI:            0.470 m AV Peak Grad:      16.1 mmHg AV Mean Grad:      12.0 mmHg LVOT Vmax:         156.00 cm/s LVOT Vmean:        110.000 cm/s LVOT VTI:          0.312 m LVOT/AV VTI ratio: 0.66  AORTA Ao Root diam: 3.25 cm Ao Asc diam:  4.30 cm MITRAL VALVE                TRICUSPID VALVE MV Area (PHT): 3.37 cm     TR Peak grad:   45.4 mmHg MV Decel Time: 225 msec     TR Vmax:        337.00 cm/s MV E velocity: 78.60 cm/s MV A velocity: 138.00 cm/s  SHUNTS MV E/A ratio:  0.57         Systemic VTI:  0.31 m                             Systemic Diam: 1.80 cm Jenkins Rouge MD Electronically signed by Jenkins Rouge MD Signature Date/Time: 11/08/2021/2:23:15 PM    Final  Scheduled Meds:  atorvastatin  80 mg Oral Daily   doxycycline  100 mg Oral Q12H   ezetimibe  10 mg Oral Daily   furosemide  40 mg Intravenous Once   gabapentin  600 mg Oral BID   guaiFENesin  600 mg Oral BID   insulin aspart  0-15 Units Subcutaneous TID AC & HS   ipratropium-albuterol  3 mL Nebulization TID   methylPREDNISolone (SOLU-MEDROL) injection  80 mg Intravenous Q24H   mirabegron ER  25 mg Oral Daily   Continuous Infusions:  sodium chloride       LOS: 1 day   Shelly Coss, MD [facility] P5/19/2023, 11:21 AM

## 2021-11-09 NOTE — Evaluation (Signed)
Physical Therapy Evaluation Patient Details Name: [name] MRN: [id number] DOB: 1951/03/25 Today's Date: 11/09/2021  History of Present Illness  Pt is a 71 y/o female admitted secondary to anemia likely from GI bleed. PMH includes HTN, COPD on supplemental O2, CAD, DM, GI bleed, and CHF.  Clinical Impression  Pt admitted secondary to problem above with deficits below. Pt requiring supervision for safety during mobility tasks. Pt requiring 1 rest break secondary to fatigue. Feel pt would benefit from HHPT at d/c. Will continue to follow acutely to maximize functional mobility independence and safety.        Recommendations for follow up therapy are one component of a multi-disciplinary discharge planning process, led by the attending physician.  Recommendations may be updated based on patient status, additional functional criteria and insurance authorization.  Follow Up Recommendations Home health PT    Assistance Recommended at Discharge Intermittent Supervision/Assistance  Patient can return home with the following  Assistance with cooking/housework    Equipment Recommendations None recommended by PT  Recommendations for Other Services       Functional Status Assessment Patient has had a recent decline in their functional status and demonstrates the ability to make significant improvements in function in a reasonable and predictable amount of time.     Precautions / Restrictions Precautions Precautions: Fall Restrictions Weight Bearing Restrictions: No      Mobility  Bed Mobility Overal bed mobility: Modified Independent                  Transfers Overall transfer level: Needs assistance Equipment used: None Transfers: Sit to/from Stand Sit to Stand: Supervision           General transfer comment: supervision for safety    Ambulation/Gait Ambulation/Gait assistance: Supervision Gait Distance (Feet): 250 Feet Assistive device: None Gait  Pattern/deviations: Step-through pattern, Decreased stride length Gait velocity: Decreased     General Gait Details: Slow, cautious gait. Standing rest X1 secondary to fatigue. Supervision for safety.  Stairs            Wheelchair Mobility    Modified Rankin (Stroke Patients Only)       Balance Overall balance assessment: Mild deficits observed, not formally tested                                           Pertinent Vitals/Pain Pain Assessment Pain Assessment: No/denies pain    Home Living Family/patient expects to be discharged to:: Private residence Living Arrangements: Children Available Help at Discharge: Family Type of Home: House Home Access: Stairs to enter Entrance Stairs-Rails: Left Entrance Stairs-Number of Steps: 5   Home Layout: One level Home Equipment: Rollator (4 wheels);Cane - single point      Prior Function Prior Level of Function : Independent/Modified Independent                     Hand Dominance        Extremity/Trunk Assessment   Upper Extremity Assessment Upper Extremity Assessment: Defer to OT evaluation    Lower Extremity Assessment Lower Extremity Assessment: Generalized weakness    Cervical / Trunk Assessment Cervical / Trunk Assessment: Normal  Communication   Communication: No difficulties  Cognition Arousal/Alertness: Awake/alert Behavior During Therapy: WFL for tasks assessed/performed Overall Cognitive Status: Within Functional Limits for tasks assessed  General Comments      Exercises     Assessment/Plan    PT Assessment Patient needs continued PT services  PT Problem List Cardiopulmonary status limiting activity;Decreased activity tolerance;Decreased balance;Decreased mobility       PT Treatment Interventions DME instruction;Gait training;Stair training;Therapeutic activities;Functional mobility training;Therapeutic  exercise;Balance training;Patient/family education    PT Goals (Current goals can be found in the Care Plan section)  Acute Rehab PT Goals Patient Stated Goal: to go home PT Goal Formulation: With patient Time For Goal Achievement: 11/23/21 Potential to Achieve Goals: Good    Frequency Min 2X/week     Co-evaluation               AM-PAC PT "6 Clicks" Mobility  Outcome Measure Help needed turning from your back to your side while in a flat bed without using bedrails?: None Help needed moving from lying on your back to sitting on the side of a flat bed without using bedrails?: None Help needed moving to and from a bed to a chair (including a wheelchair)?: A Little Help needed standing up from a chair using your arms (e.g., wheelchair or bedside chair)?: A Little Help needed to walk in hospital room?: A Little Help needed climbing 3-5 steps with a railing? : A Little 6 Click Score: 20    End of Session Equipment Utilized During Treatment: Gait belt;Oxygen Activity Tolerance: Patient limited by fatigue Patient left: in bed;with call bell/phone within reach;with family/visitor present Nurse Communication: Mobility status PT Visit Diagnosis: Other abnormalities of gait and mobility (R26.89)    Time: 3790-2409 PT Time Calculation (min) (ACUTE ONLY): 13 min   Charges:   PT Evaluation $PT Eval Low Complexity: 1 Low          Lou Miner, DPT  Acute Rehabilitation Services  Pager: [phone] Office: [phone]   Rudean Hitt 11/09/2021, 4:08 PM

## 2021-11-09 NOTE — Progress Notes (Signed)
Inpatient Diabetes Program Recommendations  AACE/ADA: New Consensus Statement on Inpatient Glycemic Control (2015)  Target Ranges:  Prepandial:   less than 140 mg/dL      Peak postprandial:   less than 180 mg/dL (1-2 hours)      Critically ill patients:  140 - 180 mg/dL   Lab Results  Component Value Date   GLUCAP 67 (L) 11/09/2021   HGBA1C 4.9 09/21/2021    Review of Glycemic Control  Latest Reference Range & Units 11/08/21 07:23 11/08/21 11:40 11/08/21 16:26 11/08/21 19:40 11/09/21 08:34 11/09/21 11:54  Glucose-Capillary 70 - 99 mg/dL 138 (H) 124 (H) 93 113 (H) 154 (H) 67 (L)   Diabetes history: DM 2 Outpatient Diabetes medications: metformin 250 mg bid Current orders for Inpatient glycemic control:  Novolog 0-15 units tid and hs  Solumedrol 80 mg Q24 hours  Inpatient Diabetes Program Recommendations:    Pt had hypoglycemia after Novolog 3 units for a glucose of 153 on current correction scale.   -  Reduce Novolog Correction to "very sensitive 0-6 units tid and hs  Thanks,  Tama Headings RN, MSN, BC-ADM Inpatient Diabetes Coordinator Team Pager [phone] (8a-5p)

## 2021-11-09 NOTE — Plan of Care (Signed)

## 2021-11-10 DIAGNOSIS — D62 Acute posthemorrhagic anemia: Secondary | ICD-10-CM | POA: Diagnosis not present

## 2021-11-10 LAB — BASIC METABOLIC PANEL
Anion gap: 7 (ref 5–15)
BUN: 16 mg/dL (ref 8–23)
CO2: 32 mmol/L (ref 22–32)
Calcium: 8.9 mg/dL (ref 8.9–10.3)
Chloride: 102 mmol/L (ref 98–111)
Creatinine, Ser: 0.78 mg/dL (ref 0.44–1.00)
GFR, Estimated: >60 mL/min (ref >60)
Glucose, Bld: 107 mg/dL — ABNORMAL HIGH (ref 70–99)
Potassium: 3.6 mmol/L (ref 3.5–5.1)
Sodium: 141 mmol/L (ref 135–145)

## 2021-11-10 LAB — HEMOGLOBIN AND HEMATOCRIT, BLOOD
HCT: 28.7 % — ABNORMAL LOW (ref 36.0–46.0)
Hemoglobin: 7.7 g/dL — ABNORMAL LOW (ref 12.0–15.0)

## 2021-11-10 LAB — GLUCOSE, CAPILLARY
Glucose-Capillary: 144 mg/dL — ABNORMAL HIGH (ref 70–99)
Glucose-Capillary: 85 mg/dL (ref 70–99)
Glucose-Capillary: 184 mg/dL — ABNORMAL HIGH (ref 70–99)
Glucose-Capillary: 120 mg/dL — ABNORMAL HIGH (ref 70–99)

## 2021-11-10 MED ORDER — GUAIFENESIN-DM 100-10 MG/5ML PO SYRP
10.0000 mL | ORAL_SOLUTION | Freq: Four times a day (QID) | ORAL | Status: DC
  Administered 2021-11-10 – 2021-11-13 (×13): 10 mL via ORAL
  Filled 2021-11-10 (×13): qty 10

## 2021-11-10 MED ORDER — FUROSEMIDE 10 MG/ML IJ SOLN
40.0000 mg | Freq: Two times a day (BID) | INTRAMUSCULAR | Status: AC
  Administered 2021-11-10 – 2021-11-12 (×6): 40 mg via INTRAVENOUS
  Filled 2021-11-10 (×6): qty 4

## 2021-11-10 MED ORDER — GUAIFENESIN ER 600 MG PO TB12
1200.0000 mg | ORAL_TABLET | Freq: Two times a day (BID) | ORAL | Status: DC
  Administered 2021-11-10 – 2021-11-13 (×6): 1200 mg via ORAL
  Filled 2021-11-10 (×6): qty 2

## 2021-11-10 MED ORDER — INSULIN ASPART 100 UNIT/ML IJ SOLN
0.0000 [IU] | Freq: Three times a day (TID) | INTRAMUSCULAR | Status: DC
  Administered 2021-11-10: 2 [IU] via SUBCUTANEOUS
  Administered 2021-11-11: 1 [IU] via SUBCUTANEOUS
  Administered 2021-11-11: 3 [IU] via SUBCUTANEOUS
  Administered 2021-11-12: 2 [IU] via SUBCUTANEOUS
  Administered 2021-11-12 – 2021-11-13 (×3): 1 [IU] via SUBCUTANEOUS

## 2021-11-10 NOTE — Progress Notes (Signed)
Mobility Specialist Progress Note   11/10/21 1220  Mobility  Activity Ambulated with assistance in hallway  Level of Assistance Modified independent, requires aide device or extra time  Assistive Device None  Distance Ambulated (ft) 192 ft  Activity Response Tolerated well  $Mobility charge 1 Mobility   Pre Mobility: 73 HR, 98% SpO2 During Mobility: 96 HR, 90% - 92% SpO2 Post Mobility: 84 HR, BP, 95% SpO2  Received pt in chair on 4LO2 having no complaints and agreeable to mobility. Ambulated w/o fault but upon returning to room pt c/o of SOB. SpO2 lvls sitting at 92% on 4LO2, encouraged PLB and pt felt better, left in chair w/ call bell in reach and all needs met.  Holland Falling Mobility Specialist Phone Number [phone]

## 2021-11-10 NOTE — Evaluation (Signed)
Occupational Therapy Evaluation Patient Details Name: Caprice Mccaffrey MRN: [id number] DOB: Aug 21, 1950 Today's Date: 11/10/2021   History of Present Illness Pt is a 71 y/o female admitted secondary to anemia likely from GI bleed. PMH includes HTN, COPD on supplemental O2, CAD, DM, GI bleed, and CHF.   Clinical Impression   Zephyr was evaluated s/p the above admission list, she is generally indep at baseline and lives with her daugther. Upon evaluation pt was limited by decreased activity tolerance, generalized weakness and limited insight or problem solving. She required min G fro transfers and ambulation with RW. However, she required up to mod A for LB Adls. Pt will benefit from OT acutely. Recommend d/c to home with [facility]     Recommendations for follow up therapy are one component of a multi-disciplinary discharge planning process, led by the attending physician.  Recommendations may be updated based on patient status, additional functional criteria and insurance authorization.   Follow Up Recommendations  Home health OT    Assistance Recommended at Discharge Frequent or constant Supervision/Assistance  Patient can return home with the following A little help with walking and/or transfers;A little help with bathing/dressing/bathroom;Assistance with cooking/housework;Direct supervision/assist for medications management;Assist for transportation;Help with stairs or ramp for entrance    Functional Status Assessment  Patient has had a recent decline in their functional status and demonstrates the ability to make significant improvements in function in a reasonable and predictable amount of time.  Equipment Recommendations  Tub/shower seat    Recommendations for Other Services       Precautions / Restrictions Precautions Precautions: Fall Restrictions Weight Bearing Restrictions: No      Mobility Bed Mobility Overal bed mobility: Modified Independent                   Transfers Overall transfer level: Needs assistance Equipment used: None Transfers: Sit to/from Stand Sit to Stand: Min guard           General transfer comment: incr WOB noted      Balance Overall balance assessment: Mild deficits observed, not formally tested                                         ADL either performed or assessed with clinical judgement   ADL Overall ADL's : Needs assistance/impaired Eating/Feeding: Independent;Sitting   Grooming: Min guard;Standing   Upper Body Bathing: Set up;Sitting   Lower Body Bathing: Minimal assistance;Sit to/from stand   Upper Body Dressing : Set up;Sitting   Lower Body Dressing: Moderate assistance;Sit to/from stand Lower Body Dressing Details (indicate cue type and reason): pt states her daughter assists wtih socks Toilet Transfer: Min guard;Ambulation   Toileting- Clothing Manipulation and Hygiene: Min guard;Sitting/lateral lean       Functional mobility during ADLs: Min guard;Rolling walker (2 wheels)       Vision Baseline Vision/History: 1 Wears glasses Ability to See in Adequate Light: 0 Adequate Vision Assessment?: No apparent visual deficits     Perception     Praxis      Pertinent Vitals/Pain Pain Assessment Pain Assessment: Faces Faces Pain Scale: No hurt Pain Intervention(s): Monitored during session     Hand Dominance Right   Extremity/Trunk Assessment Upper Extremity Assessment Upper Extremity Assessment: Generalized weakness   Lower Extremity Assessment Lower Extremity Assessment: Defer to PT evaluation   Cervical / Trunk Assessment Cervical / Trunk Assessment: Normal  Communication Communication Communication: No difficulties   Cognition Arousal/Alertness: Awake/alert Behavior During Therapy: WFL for tasks assessed/performed, Agitated Overall Cognitive Status: Within Functional Limits for tasks assessed                                 General  Comments: a littel agitated with therapist, stating the reason why she is in the hospital was becuase the doctor told her to walk. educated on the importance of frequent mobility and activity tolerance, pt disagreeing     General Comments  VSS on 2L    Exercises     Shoulder Instructions      Home Living Family/patient expects to be discharged to:: Private residence Living Arrangements: Children Available Help at Discharge: Family Type of Home: House Home Access: Stairs to enter Technical brewer of Steps: 5 Entrance Stairs-Rails: Left Home Layout: One level     Bathroom Shower/Tub: Occupational psychologist: Handicapped height Bathroom Accessibility: Yes   Home Equipment: [place] (4 wheels);Cane - single point          Prior Functioning/Environment Prior Level of Function : Independent/Modified Independent             Mobility Comments: denies use of assistive device; denies falls          OT Problem List: Decreased strength;Decreased range of motion;Decreased activity tolerance;Impaired balance (sitting and/or standing);Decreased safety awareness;Decreased knowledge of precautions      OT Treatment/Interventions: Self-care/ADL training;Therapeutic exercise;Balance training;Patient/family education;Therapeutic activities;DME and/or AE instruction    OT Goals(Current goals can be found in the care plan section) Acute Rehab OT Goals Patient Stated Goal: go home OT Goal Formulation: With patient Time For Goal Achievement: 11/24/21 Potential to Achieve Goals: Good  OT Frequency: Min 2X/week    Co-evaluation              AM-PAC OT "6 Clicks" Daily Activity     Outcome Measure Help from another person eating meals?: None Help from another person taking care of personal grooming?: A Little   Help from another person bathing (including washing, rinsing, drying)?: A Lot Help from another person to put on and taking off regular upper body  clothing?: A Little Help from another person to put on and taking off regular lower body clothing?: A Lot 6 Click Score: 14   End of Session Equipment Utilized During Treatment: Gait belt;Oxygen Nurse Communication: Mobility status  Activity Tolerance: Patient tolerated treatment well Patient left: in chair;with call bell/phone within reach;with chair alarm set  OT Visit Diagnosis: Unsteadiness on feet (R26.81);Other abnormalities of gait and mobility (R26.89);Muscle weakness (generalized) (M62.81)                Time: 0626-9485 OT Time Calculation (min): 11 min Charges:  OT General Charges $OT Visit: 1 Visit OT Evaluation $OT Eval Moderate Complexity: 1 Mod   Shadow Schedler A Ekam Bonebrake 11/10/2021, 1:21 PM

## 2021-11-10 NOTE — Progress Notes (Signed)
PROGRESS NOTE  [name]  WUJ:[id number] DOB: 12/04/1950 DOA: 11/07/2021 PCP: Benito Mccreedy, MD   Brief Narrative:  Patient is a 71 year old female with past medical history of COPD, chronic hypoxic respiratory failure on 2 L at home, not on insulin dependent diabetes type 2, hypertension, hyperlipidemia, GERD, coronary artery disease, peripheral vascular disease, OSA, hospitalization for GI bleed from AVMs in the past who presented with a complaint of shortness of breath.  In the emergency department, she was found to have hemoglobin of 5.2.  Hemoccult was positive.  Patient was transfused with PRBCs and admitted for further evaluation and management.  Hospital course also remarkable for fluid overload, COPD exacerbation.  Assessment & Plan:  Principal Problem:   Acute on chronic blood loss anemia Active Problems:   Acute upper gastrointestinal bleeding   Acute cardiogenic pulmonary edema (HCC)   Chronic respiratory failure with hypoxia (HCC)   Acute bacterial bronchitis   Elevated troponin level not due myocardial infarction   COPD (chronic obstructive pulmonary disease) (HCC)   Coronary artery disease involving native coronary artery of native heart without angina pectoris   Type 2 diabetes mellitus without complication, without long-term current use of insulin ([place])   Essential hypertension   Mixed diabetic hyperlipidemia associated with type 2 diabetes mellitus (HCC)   GI bleed     Acute on chronic blood loss anemia/acute upper GI bleed: Presented with progressive worsening shortness of breath.  Hemoglobin was found to be in the range of 5 on presentation, last hemoglobin was in the range of 7 several weeks ago.  She has history of chronic GI bleed.  She was admitted last time on March this year when she presented with 2-week history of melena.  She was evaluated by GI at that time and she underwent small bowel capsule endoscopy revealing nonbleeding AVMs.  GI  consulted again here.  GI is not planning for further work-up here and recommended outpatient follow-up with [facility]. She was transfused with 2 units of  PRBCs, Hb this mrng is in the range of 7.Continue monitor H&H.She takes aspirin and Pletal at home which we will resume on dc  Acute on chronic hypoxic respiratory failure: Likely secondary to CHF, COPD combination.  Presented with shortness of breath.  On 2-3 L of oxygen at home.  Needed 3 L here.  Chest x-ray earlier showed mild pulmonary edema. Chest x-ray done on 5/19 did not show significant pulmonary edema.  Acute on chronic diastolic congestive heart failure: BNP is elevated.  Also had lower extremity edema .  Continue IV Lasix 40 mg twice a day.  Last echo on 09/01/21 showed EF of 95%, grade 2 diastolic dysfunction.  New echo done here showed EF of 55 to 62%, grade 1 diastolic dysfunction, diminished right ventricular function, severe elevated pulmonary artery pressure  History of COPD: Started on Solu-Medrol.  Continue bronchodilators.  Elevated troponin: Secondary to supply demand ischemia, type II MI from anemia.  No chest pain.  History of coronary artery disease: Currently chest pain-free.  Takes aspirin, Pletal at home: Currently on hold  Type 2 diabetes: Recent hemoglobin A1c of 4.9.  Continue sliding-scale insulin.  Hypertension: Monitor blood pressure.  Takes antihypertensives at home.  Currently normotensive  Hyperlipidemia: Takes Lipitor  Morbid obesity: BMI of 43.7  Debility/deconditioning: PT recommended home health on discharge    DVT prophylaxis:SCDs Start: 11/08/21 0014     Code Status: Full Code  Family Communication: Called and discussed with daughter on phone on  5/18  Patient status: Inpatient  Patient is from : Home  Anticipated discharge to: Home  Estimated DC date: In 1 to 2 days   Consultants: GI  Procedures: None yet  Antimicrobials:  Anti-infectives (From admission, onward)    Start      Dose/Rate Route Frequency Ordered Stop   11/09/21 1215  doxycycline (VIBRA-TABS) tablet 100 mg        100 mg Oral Every 12 hours 11/09/21 1121     11/08/21 0600  doxycycline (VIBRAMYCIN) 100 mg in sodium chloride 0.9 % 250 mL IVPB  Status:  Discontinued        100 mg 125 mL/hr over 120 Minutes Intravenous 2 times daily 11/08/21 0453 11/09/21 1121       Subjective: Patient seen and examined at the bedside this morning.  She is bothered with cough, shortness of breath.  He persistent bilateral lower extremity edema.  Started on scheduled IV Lasix.  No report of new hematochezia or melena.  Objective: Vitals:   11/09/21 2052 11/10/21 0459 11/10/21 0749 11/10/21 0836  BP: (!) 116/56 103/83 129/80   Pulse: 88 88 77 86  Resp: 20 20 17    Temp: 97.7 F (36.5 C) 98.7 F (37.1 C) 98.3 F (36.8 C)   TempSrc: Oral Oral Oral   SpO2: 95% 98% 100% 100%  Weight:      Height:        Intake/Output Summary (Last 24 hours) at 11/10/2021 1131 Last data filed at 11/10/2021 0700 Gross per 24 hour  Intake 720 ml  Output 2700 ml  Net -1980 ml   Filed Weights   11/08/21 0131  Weight: 101.7 kg    Examination:  General exam: Overall comfortable, not in distress,obese HEENT: PERRL Respiratory system: Diminished sounds bilaterally, bilateral expiratory wheezing, basal crackles  Cardiovascular system: S1 & S2 heard, RRR.  Gastrointestinal system: Abdomen is nondistended, soft and nontender. Central nervous system: Alert and oriented Extremities: 1+ bilateral lower extremity edema, no clubbing ,no cyanosis Skin: No rashes, no ulcers,no icterus     Data Reviewed: I have personally reviewed following labs and imaging studies  CBC: Recent Labs  Lab 11/07/21 1852 11/08/21 0937 11/08/21 2157 11/09/21 1019 11/10/21 0219  WBC 12.2*  --   --   --   --   HGB 5.2* 7.2* 7.5* 7.9* 7.7*  HCT 21.0* 24.5* 25.9* 29.3* 28.7*  MCV 78.1*  --   --   --   --   PLT 444*  --   --   --   --    Basic  Metabolic Panel: Recent Labs  Lab 11/07/21 1852 11/08/21 0937 11/09/21 1218 11/10/21 0219  NA 136 136 139 141  K 3.5 3.7 3.9 3.6  CL 104 103 103 102  CO2 26 26 26  32  GLUCOSE 111* 112* 125* 107*  BUN 11 11 14 16   CREATININE 0.77 0.69 0.76 0.78  CALCIUM 9.4 8.9 8.9 8.9  MG  --  1.7  --   --      Recent Results (from the past 240 hour(s))  Resp Panel by RT-PCR (Flu A&B, Covid) Nasopharyngeal Swab     Status: None   Collection Time: 11/07/21  5:56 PM   Specimen: Nasopharyngeal Swab; Nasopharyngeal(NP) swabs in vial transport medium  Result Value Ref Range Status   SARS Coronavirus 2 by RT PCR NEGATIVE NEGATIVE Final    Comment: (NOTE) SARS-CoV-2 target nucleic acids are NOT DETECTED.  The SARS-CoV-2 RNA is generally detectable in  upper respiratory specimens during the acute phase of infection. The lowest concentration of SARS-CoV-2 viral copies this assay can detect is 138 copies/mL. A negative result does not preclude SARS-Cov-2 infection and should not be used as the sole basis for treatment or other patient management decisions. A negative result may occur with  improper specimen collection/handling, submission of specimen other than nasopharyngeal swab, presence of viral mutation(s) within the areas targeted by this assay, and inadequate number of viral copies(<138 copies/mL). A negative result must be combined with clinical observations, patient history, and epidemiological information. The expected result is Negative.  Fact Sheet for Patients:  EntrepreneurPulse.com.au  Fact Sheet for Healthcare Providers:  IncredibleEmployment.be  This test is no t yet approved or cleared by the [place] FDA and  has been authorized for detection and/or diagnosis of SARS-CoV-2 by FDA under an Emergency Use Authorization (EUA). This EUA will remain  in effect (meaning this test can be used) for the duration of the COVID-19 declaration under  Section 564(b)(1) of the Act, 21 U.S.C.section 360bbb-3(b)(1), unless the authorization is terminated  or revoked sooner.       Influenza A by PCR NEGATIVE NEGATIVE Final   Influenza B by PCR NEGATIVE NEGATIVE Final    Comment: (NOTE) The Xpert Xpress SARS-CoV-2/FLU/RSV plus assay is intended as an aid in the diagnosis of influenza from Nasopharyngeal swab specimens and should not be used as a sole basis for treatment. Nasal washings and aspirates are unacceptable for Xpert Xpress SARS-CoV-2/FLU/RSV testing.  Fact Sheet for Patients: EntrepreneurPulse.com.au  Fact Sheet for Healthcare Providers: IncredibleEmployment.be  This test is not yet approved or cleared by the [place] FDA and has been authorized for detection and/or diagnosis of SARS-CoV-2 by FDA under an Emergency Use Authorization (EUA). This EUA will remain in effect (meaning this test can be used) for the duration of the COVID-19 declaration under Section 564(b)(1) of the Act, 21 U.S.C. section 360bbb-3(b)(1), unless the authorization is terminated or revoked.  Performed at [facility], [place] [address] [place], [place] [address]      Radiology Studies: DG CHEST PORT 1 VIEW  Result Date: 11/09/2021 CLINICAL DATA:  Shortness of breath, cough EXAM: PORTABLE CHEST 1 VIEW COMPARISON:  11/07/2021 FINDINGS: Cardiomegaly. No frank interstitial edema. No pleural effusion or pneumothorax. Thoracic aortic atherosclerosis. IMPRESSION: Cardiomegaly. No frank interstitial edema. Electronically Signed   By: Julian Hy M.D.   On: 11/09/2021 02:04   ECHOCARDIOGRAM COMPLETE  Result Date: 11/08/2021    ECHOCARDIOGRAM REPORT   Patient Name:   RALONDA TARTT Ruminski Date of Exam: 11/08/2021 Medical Rec #:  [id number]             Height:       60.0 in Accession #:    [id number]            Weight:       224.2 lb Date of Birth:  03/20/1951             BSA:          1.959 m Patient Age:     29 years              BP:           118/61 mmHg Patient Gender: F                     HR:           84 bpm. Exam Location:  Inpatient Procedure:  2D Echo, Cardiac Doppler and Color Doppler Indications:    CHF  History:        Patient has no prior history of Echocardiogram examinations and                 Patient has prior history of Echocardiogram examinations, most                 recent 08/29/2021. CAD, COPD; Risk Factors:Hypertension and                 Diabetes.  Sonographer:    Jefferey Pica Referring Phys: [id number] Teela Narducci IMPRESSIONS  1. Left ventricular ejection fraction, by estimation, is 55 to 60%. The left ventricle has normal function. The left ventricle has no regional wall motion abnormalities. Left ventricular diastolic parameters are consistent with Grade I diastolic dysfunction (impaired relaxation).  2. Right ventricular systolic function is moderately reduced. The right ventricular size is moderately enlarged. There is severely elevated pulmonary artery systolic pressure.  3. Right atrial size was moderately dilated.  4. The mitral valve is abnormal. Trivial mitral valve regurgitation. No evidence of mitral stenosis.  5. Tricuspid valve regurgitation is mild to moderate.  6. Gradients stable to slightly lower compared to TTE done 08/29/21. The aortic valve is tricuspid. There is moderate calcification of the aortic valve. There is moderate thickening of the aortic valve. Aortic valve regurgitation is not visualized. Mild aortic valve stenosis.  7. Aortic dilatation noted. There is dilatation of the ascending aorta, measuring 43 mm.  8. The inferior vena cava is dilated in size with >50% respiratory variability, suggesting right atrial pressure of 8 mmHg. FINDINGS  Left Ventricle: Left ventricular ejection fraction, by estimation, is 55 to 60%. The left ventricle has normal function. The left ventricle has no regional wall motion abnormalities. The left ventricular internal cavity size was  normal in size. There is  no left ventricular hypertrophy. Left ventricular diastolic parameters are consistent with Grade I diastolic dysfunction (impaired relaxation). Right Ventricle: The right ventricular size is moderately enlarged. No increase in right ventricular wall thickness. Right ventricular systolic function is moderately reduced. There is severely elevated pulmonary artery systolic pressure. The tricuspid regurgitant velocity is 3.37 m/s, and with an assumed right atrial pressure of 15 mmHg, the estimated right ventricular systolic pressure is 82.9 mmHg. Left Atrium: Left atrial size was normal in size. Right Atrium: Right atrial size was moderately dilated. Pericardium: There is no evidence of pericardial effusion. Mitral Valve: The mitral valve is abnormal. There is mild thickening of the mitral valve leaflet(s). There is mild calcification of the mitral valve leaflet(s). Trivial mitral valve regurgitation. No evidence of mitral valve stenosis. Tricuspid Valve: The tricuspid valve is normal in structure. Tricuspid valve regurgitation is mild to moderate. No evidence of tricuspid stenosis. Aortic Valve: Gradients stable to slightly lower compared to TTE done 08/29/21. The aortic valve is tricuspid. There is moderate calcification of the aortic valve. There is moderate thickening of the aortic valve. Aortic valve regurgitation is not visualized. Mild aortic stenosis is present. Aortic valve mean gradient measures 12.0 mmHg. Aortic valve peak gradient measures 16.1 mmHg. Aortic valve area, by VTI measures 1.69 cm. Pulmonic Valve: The pulmonic valve was normal in structure. Pulmonic valve regurgitation is mild. No evidence of pulmonic stenosis. Aorta: Aortic dilatation noted. There is dilatation of the ascending aorta, measuring 43 mm. Venous: The inferior vena cava is dilated in size with greater than 50% respiratory variability, suggesting right atrial pressure of  8 mmHg. IAS/Shunts: The interatrial  septum was not well visualized.  LEFT VENTRICLE PLAX 2D LVIDd:         4.20 cm   Diastology LVIDs:         3.05 cm   LV e' medial:    5.57 cm/s LV PW:         1.10 cm   LV E/e' medial:  14.1 LV IVS:        0.95 cm   LV e' lateral:   7.72 cm/s LVOT diam:     1.80 cm   LV E/e' lateral: 10.2 LV SV:         79 LV SV Index:   41 LVOT Area:     2.54 cm  RIGHT VENTRICLE             IVC RV S prime:     12.10 cm/s  IVC diam: 2.50 cm TAPSE (M-mode): 2.0 cm LEFT ATRIUM             Index        RIGHT ATRIUM           Index LA diam:        3.40 cm 1.74 cm/m   RA Area:     12.90 cm LA Vol (A2C):   39.4 ml 20.11 ml/m  RA Volume:   31.90 ml  16.28 ml/m LA Vol (A4C):   44.5 ml 22.71 ml/m LA Biplane Vol: 42.2 ml 21.54 ml/m  AORTIC VALVE                     PULMONIC VALVE AV Area (Vmax):    1.98 cm      PV Vmax:       1.17 m/s AV Area (Vmean):   1.76 cm      PV Peak grad:  5.5 mmHg AV Area (VTI):     1.69 cm AV Vmax:           200.68 cm/s AV Vmean:          159.000 cm/s AV VTI:            0.470 m AV Peak Grad:      16.1 mmHg AV Mean Grad:      12.0 mmHg LVOT Vmax:         156.00 cm/s LVOT Vmean:        110.000 cm/s LVOT VTI:          0.312 m LVOT/AV VTI ratio: 0.66  AORTA Ao Root diam: 3.25 cm Ao Asc diam:  4.30 cm MITRAL VALVE                TRICUSPID VALVE MV Area (PHT): 3.37 cm     TR Peak grad:   45.4 mmHg MV Decel Time: 225 msec     TR Vmax:        337.00 cm/s MV E velocity: 78.60 cm/s MV A velocity: 138.00 cm/s  SHUNTS MV E/A ratio:  0.57         Systemic VTI:  0.31 m                             Systemic Diam: 1.80 cm Jenkins Rouge MD Electronically signed by Jenkins Rouge MD Signature Date/Time: 11/08/2021/2:23:15 PM    Final     Scheduled Meds:  atorvastatin  80 mg Oral Daily   doxycycline  100 mg Oral  Q12H   ezetimibe  10 mg Oral Daily   furosemide  40 mg Intravenous Q12H   gabapentin  600 mg Oral BID   guaiFENesin  1,200 mg Oral BID   guaiFENesin-dextromethorphan  10 mL Oral Q6H   insulin aspart  0-9 Units  Subcutaneous TID WC   ipratropium-albuterol  3 mL Nebulization TID   methylPREDNISolone (SOLU-MEDROL) injection  80 mg Intravenous Q24H   mirabegron ER  25 mg Oral Daily   Continuous Infusions:  sodium chloride       LOS: 2 days   Shelly Coss, MD [facility] P5/20/2023, 11:31 AM

## 2021-11-10 NOTE — Plan of Care (Signed)

## 2021-11-11 DIAGNOSIS — D62 Acute posthemorrhagic anemia: Secondary | ICD-10-CM | POA: Diagnosis not present

## 2021-11-11 LAB — BASIC METABOLIC PANEL
Anion gap: 6 (ref 5–15)
Anion gap: 7 (ref 5–15)
BUN: 21 mg/dL (ref 8–23)
BUN: 19 mg/dL (ref 8–23)
CO2: 35 mmol/L — ABNORMAL HIGH (ref 22–32)
CO2: 37 mmol/L — ABNORMAL HIGH (ref 22–32)
Calcium: 8.9 mg/dL (ref 8.9–10.3)
Calcium: 9 mg/dL (ref 8.9–10.3)
Chloride: 98 mmol/L (ref 98–111)
Chloride: 97 mmol/L — ABNORMAL LOW (ref 98–111)
Creatinine, Ser: 0.82 mg/dL (ref 0.44–1.00)
Creatinine, Ser: 0.77 mg/dL (ref 0.44–1.00)
GFR, Estimated: >60 mL/min (ref >60)
GFR, Estimated: >60 mL/min (ref >60)
Glucose, Bld: 109 mg/dL — ABNORMAL HIGH (ref 70–99)
Glucose, Bld: 99 mg/dL (ref 70–99)
Potassium: 4.1 mmol/L (ref 3.5–5.1)
Potassium: 3.6 mmol/L (ref 3.5–5.1)
Sodium: 139 mmol/L (ref 135–145)
Sodium: 141 mmol/L (ref 135–145)

## 2021-11-11 LAB — BPAM RBC
Blood Product Expiration Date: 202305312359
Blood Product Expiration Date: 202306042359
Blood Product Expiration Date: 202306112359
Blood Product Expiration Date: 202306112359
ISSUE DATE / TIME: 202305180118
ISSUE DATE / TIME: 202305180437
Unit Type and Rh: 5100
Unit Type and Rh: 5100
Unit Type and Rh: 5100
Unit Type and Rh: 5100

## 2021-11-11 LAB — TYPE AND SCREEN
ABO/RH(D): O POS
Antibody Screen: POSITIVE
Donor AG Type: NEGATIVE
Donor AG Type: NEGATIVE
Donor AG Type: NEGATIVE
Donor AG Type: NEGATIVE
Unit division: 0
Unit division: 0
Unit division: 0
Unit division: 0

## 2021-11-11 LAB — GLUCOSE, CAPILLARY
Glucose-Capillary: 188 mg/dL — ABNORMAL HIGH (ref 70–99)
Glucose-Capillary: 142 mg/dL — ABNORMAL HIGH (ref 70–99)
Glucose-Capillary: 314 mg/dL — ABNORMAL HIGH (ref 70–99)
Glucose-Capillary: 243 mg/dL — ABNORMAL HIGH (ref 70–99)
Glucose-Capillary: 119 mg/dL — ABNORMAL HIGH (ref 70–99)

## 2021-11-11 LAB — HEMOGLOBIN AND HEMATOCRIT, BLOOD
HCT: 28.9 % — ABNORMAL LOW (ref 36.0–46.0)
Hemoglobin: 8 g/dL — ABNORMAL LOW (ref 12.0–15.0)

## 2021-11-11 LAB — MAGNESIUM: Magnesium: 1.6 mg/dL — ABNORMAL LOW (ref 1.7–2.4)

## 2021-11-11 MED ORDER — INSULIN GLARGINE-YFGN 100 UNIT/ML ~~LOC~~ SOLN
10.0000 [IU] | Freq: Once | SUBCUTANEOUS | Status: AC
Start: 2021-11-11 — End: 2021-11-11
  Administered 2021-11-11: 10 [IU] via SUBCUTANEOUS
  Filled 2021-11-11: qty 0.1

## 2021-11-11 MED ORDER — MAGNESIUM OXIDE -MG SUPPLEMENT 400 (240 MG) MG PO TABS
800.0000 mg | ORAL_TABLET | Freq: Once | ORAL | Status: AC
  Administered 2021-11-11: 800 mg via ORAL
  Filled 2021-11-11: qty 2

## 2021-11-11 MED ORDER — MAGNESIUM SULFATE 2 GM/50ML IV SOLN
2.0000 g | Freq: Once | INTRAVENOUS | Status: DC
  Filled 2021-11-11: qty 50

## 2021-11-11 NOTE — Progress Notes (Signed)
PROGRESS NOTE  Desha Bitner  YYT:[id number] DOB: Oct 15, 1950 DOA: 11/07/2021 PCP: Benito Mccreedy, MD   Brief Narrative:  Patient is a 71 year old female with past medical history of COPD, chronic hypoxic respiratory failure on 2 L at home, not on insulin dependent diabetes type 2, hypertension, hyperlipidemia, GERD, coronary artery disease, peripheral vascular disease, OSA, hospitalization for GI bleed from AVMs in the past who presented with a complaint of shortness of breath.  In the emergency department, she was found to have hemoglobin of 5.2.  Hemoccult was positive.  Patient was transfused with PRBCs and admitted for further evaluation and management.  Hospital course also remarkable for fluid overload, COPD exacerbation.  Assessment & Plan:  Principal Problem:   Acute on chronic blood loss anemia Active Problems:   Acute upper gastrointestinal bleeding   Acute cardiogenic pulmonary edema (HCC)   Chronic respiratory failure with hypoxia (HCC)   Acute bacterial bronchitis   Elevated troponin level not due myocardial infarction   COPD (chronic obstructive pulmonary disease) (HCC)   Coronary artery disease involving native coronary artery of native heart without angina pectoris   Type 2 diabetes mellitus without complication, without long-term current use of insulin ([place])   Essential hypertension   Mixed diabetic hyperlipidemia associated with type 2 diabetes mellitus (HCC)   GI bleed     Acute on chronic blood loss anemia/acute upper GI bleed: Presented with progressive worsening shortness of breath.  Hemoglobin was found to be in the range of 5 on presentation, last hemoglobin was in the range of 7 several weeks ago.  She has history of chronic GI bleed.  She was admitted last time on March this year when she presented with 2-week history of melena.  She was evaluated by GI at that time and she underwent small bowel capsule endoscopy revealing nonbleeding AVMs.  GI  consulted again here.  GI is not planning for further work-up here and recommended outpatient follow-up with [facility]. She was transfused with 2 units of  PRBCs, Hb this mrng is in the range of 8.She takes aspirin and Pletal at home which we will resume on dc  Acute on chronic hypoxic respiratory failure: Likely secondary to CHF, COPD combination.  Presented with shortness of breath.  On 2-3 L of oxygen at home.  Needed 3 L here.  Chest x-ray earlier showed mild pulmonary edema. Chest x-ray done on 5/19 did not show significant pulmonary edema.  Acute on chronic diastolic congestive heart failure: BNP is elevated.  Also had lower extremity edema .  Continue IV Lasix 40 mg twice a day.  Last echo on 09/01/21 showed EF of 27%, grade 2 diastolic dysfunction.  New echo done here showed EF of 55 to 51%, grade 1 diastolic dysfunction, diminished right ventricular function, severe elevated pulmonary artery pressure  History of COPD: Started on Solu-Medrol,continue for today.  Continue bronchodilators.  Elevated troponin: Secondary to supply demand ischemia, type II MI from anemia.  No chest pain.  History of coronary artery disease: Currently chest pain-free.  Takes aspirin, Pletal at home: Currently on hold  Type 2 diabetes: Recent hemoglobin A1c of 4.9.  Continue sliding-scale insulin.  Hypertension: Monitor blood pressure.  Takes antihypertensives at home.  Currently normotensive  Hyperlipidemia: Takes Lipitor  Hypomagnesemia: Supplemented with admission  Morbid obesity: BMI of 43.7  Debility/deconditioning: PT/OT recommended home health on discharge    DVT prophylaxis:SCDs Start: 11/08/21 0014     Code Status: Full Code  Family Communication: Called and discussed  with daughter on phone on 5/18  Patient status: Inpatient  Patient is from : Home  Anticipated discharge to: Home  Estimated DC date: In 1 to 2 days.  Currently needing IV steroids and IV Lasix   Consultants:  GI  Procedures: None yet  Antimicrobials:  Anti-infectives (From admission, onward)    Start     Dose/Rate Route Frequency Ordered Stop   11/09/21 1215  doxycycline (VIBRA-TABS) tablet 100 mg        100 mg Oral Every 12 hours 11/09/21 1121     11/08/21 0600  doxycycline (VIBRAMYCIN) 100 mg in sodium chloride 0.9 % 250 mL IVPB  Status:  Discontinued        100 mg 125 mL/hr over 120 Minutes Intravenous 2 times daily 11/08/21 0453 11/09/21 1121       Subjective: Patient seen and examined at the bedside this morning.  Feels slightly better today.  Surgical complaints of congestion, some cough, mucus.  Bilateral lower EXTR edema has improved.  Continue Lasix and IV steroids for today  Objective: Vitals:   11/10/21 1332 11/10/21 2022 11/11/21 0429 11/11/21 0819  BP: (!) 122/57 122/62 (!) 126/52 120/70  Pulse: 89 82 77 95  Resp:  20  18  Temp: 98.3 F (36.8 C) 98.4 F (36.9 C) 98.2 F (36.8 C) 98 F (36.7 C)  TempSrc: Oral Oral Oral   SpO2: 100% 100% 100% 91%  Weight:      Height:        Intake/Output Summary (Last 24 hours) at 11/11/2021 1139 Last data filed at 11/11/2021 1132 Gross per 24 hour  Intake --  Output 4100 ml  Net -4100 ml   Filed Weights   11/08/21 0131  Weight: 101.7 kg    Examination:  General exam: not in distress, deconditioned, chronically ill looking, weak, obese HEENT: PERRL Respiratory system: Bilateral scattered rhonchi, few wheezing Cardiovascular system: S1 & S2 heard, RRR.  Gastrointestinal system: Abdomen is nondistended, soft and nontender. Central nervous system: Alert and oriented Extremities: Trace bilateral lower extremity  edema, no clubbing ,no cyanosis Skin: No rashes, no ulcers,no icterus       Data Reviewed: I have personally reviewed following labs and imaging studies  CBC: Recent Labs  Lab 11/07/21 1852 11/08/21 0937 11/08/21 2157 11/09/21 1019 11/10/21 0219 11/11/21 0127  WBC 12.2*  --   --   --   --   --   HGB  5.2* 7.2* 7.5* 7.9* 7.7* 8.0*  HCT 21.0* 24.5* 25.9* 29.3* 28.7* 28.9*  MCV 78.1*  --   --   --   --   --   PLT 444*  --   --   --   --   --    Basic Metabolic Panel: Recent Labs  Lab 11/08/21 0937 11/09/21 1218 11/10/21 0219 11/11/21 0127 11/11/21 0555  NA 136 139 141 139 141  K 3.7 3.9 3.6 4.1 3.6  CL 103 103 102 98 97*  CO2 26 26 32 35* 37*  GLUCOSE 112* 125* 107* 109* 99  BUN 11 14 16 21 19   CREATININE 0.69 0.76 0.78 0.82 0.77  CALCIUM 8.9 8.9 8.9 8.9 9.0  MG 1.7  --   --   --  1.6*     Recent Results (from the past 240 hour(s))  Resp Panel by RT-PCR (Flu A&B, Covid) Nasopharyngeal Swab     Status: None   Collection Time: 11/07/21  5:56 PM   Specimen: Nasopharyngeal Swab; Nasopharyngeal(NP) swabs  in vial transport medium  Result Value Ref Range Status   SARS Coronavirus 2 by RT PCR NEGATIVE NEGATIVE Final    Comment: (NOTE) SARS-CoV-2 target nucleic acids are NOT DETECTED.  The SARS-CoV-2 RNA is generally detectable in upper respiratory specimens during the acute phase of infection. The lowest concentration of SARS-CoV-2 viral copies this assay can detect is 138 copies/mL. A negative result does not preclude SARS-Cov-2 infection and should not be used as the sole basis for treatment or other patient management decisions. A negative result may occur with  improper specimen collection/handling, submission of specimen other than nasopharyngeal swab, presence of viral mutation(s) within the areas targeted by this assay, and inadequate number of viral copies(<138 copies/mL). A negative result must be combined with clinical observations, patient history, and epidemiological information. The expected result is Negative.  Fact Sheet for Patients:  EntrepreneurPulse.com.au  Fact Sheet for Healthcare Providers:  IncredibleEmployment.be  This test is no t yet approved or cleared by the [place] FDA and  has been authorized for  detection and/or diagnosis of SARS-CoV-2 by FDA under an Emergency Use Authorization (EUA). This EUA will remain  in effect (meaning this test can be used) for the duration of the COVID-19 declaration under Section 564(b)(1) of the Act, 21 U.S.C.section 360bbb-3(b)(1), unless the authorization is terminated  or revoked sooner.       Influenza A by PCR NEGATIVE NEGATIVE Final   Influenza B by PCR NEGATIVE NEGATIVE Final    Comment: (NOTE) The Xpert Xpress SARS-CoV-2/FLU/RSV plus assay is intended as an aid in the diagnosis of influenza from Nasopharyngeal swab specimens and should not be used as a sole basis for treatment. Nasal washings and aspirates are unacceptable for Xpert Xpress SARS-CoV-2/FLU/RSV testing.  Fact Sheet for Patients: EntrepreneurPulse.com.au  Fact Sheet for Healthcare Providers: IncredibleEmployment.be  This test is not yet approved or cleared by the [place] FDA and has been authorized for detection and/or diagnosis of SARS-CoV-2 by FDA under an Emergency Use Authorization (EUA). This EUA will remain in effect (meaning this test can be used) for the duration of the COVID-19 declaration under Section 564(b)(1) of the Act, 21 U.S.C. section 360bbb-3(b)(1), unless the authorization is terminated or revoked.  Performed at [facility], [place] [address] [place], [place] [address]      Radiology Studies: No results found.  Scheduled Meds:  atorvastatin  80 mg Oral Daily   doxycycline  100 mg Oral Q12H   ezetimibe  10 mg Oral Daily   furosemide  40 mg Intravenous Q12H   gabapentin  600 mg Oral BID   guaiFENesin  1,200 mg Oral BID   guaiFENesin-dextromethorphan  10 mL Oral Q6H   insulin aspart  0-9 Units Subcutaneous TID WC   methylPREDNISolone (SOLU-MEDROL) injection  80 mg Intravenous Q24H   mirabegron ER  25 mg Oral Daily   Continuous Infusions:  sodium chloride     magnesium sulfate bolus IVPB        LOS: 3 days   Shelly Coss, MD [facility] P5/21/2023, 11:39 AM

## 2021-11-12 DIAGNOSIS — I21A1 Myocardial infarction type 2: Secondary | ICD-10-CM | POA: Diagnosis not present

## 2021-11-12 DIAGNOSIS — Z20822 Contact with and (suspected) exposure to covid-19: Secondary | ICD-10-CM | POA: Diagnosis not present

## 2021-11-12 DIAGNOSIS — K922 Gastrointestinal hemorrhage, unspecified: Secondary | ICD-10-CM | POA: Diagnosis not present

## 2021-11-12 DIAGNOSIS — I5033 Acute on chronic diastolic (congestive) heart failure: Secondary | ICD-10-CM | POA: Diagnosis not present

## 2021-11-12 DIAGNOSIS — D62 Acute posthemorrhagic anemia: Secondary | ICD-10-CM | POA: Diagnosis not present

## 2021-11-12 LAB — HEMOGLOBIN AND HEMATOCRIT, BLOOD
HCT: 32.5 % — ABNORMAL LOW (ref 36.0–46.0)
Hemoglobin: 8.9 g/dL — ABNORMAL LOW (ref 12.0–15.0)

## 2021-11-12 LAB — GLUCOSE, CAPILLARY
Glucose-Capillary: 179 mg/dL — ABNORMAL HIGH (ref 70–99)
Glucose-Capillary: 129 mg/dL — ABNORMAL HIGH (ref 70–99)
Glucose-Capillary: 145 mg/dL — ABNORMAL HIGH (ref 70–99)
Glucose-Capillary: 229 mg/dL — ABNORMAL HIGH (ref 70–99)

## 2021-11-12 LAB — BASIC METABOLIC PANEL
Anion gap: 7 (ref 5–15)
BUN: 20 mg/dL (ref 8–23)
CO2: 39 mmol/L — ABNORMAL HIGH (ref 22–32)
Calcium: 9.2 mg/dL (ref 8.9–10.3)
Chloride: 95 mmol/L — ABNORMAL LOW (ref 98–111)
Creatinine, Ser: 0.81 mg/dL (ref 0.44–1.00)
GFR, Estimated: >60 mL/min (ref >60)
Glucose, Bld: 111 mg/dL — ABNORMAL HIGH (ref 70–99)
Potassium: 3.6 mmol/L (ref 3.5–5.1)
Sodium: 141 mmol/L (ref 135–145)

## 2021-11-12 MED ORDER — ALUM & MAG HYDROXIDE-SIMETH 200-200-20 MG/5ML PO SUSP: 30.0000 mL | Freq: Four times a day (QID) | ORAL | Status: DC | PRN

## 2021-11-12 MED ORDER — LIDOCAINE VISCOUS HCL 2 % MT SOLN
15.0000 mL | Freq: Four times a day (QID) | OROMUCOSAL | Status: DC | PRN
  Filled 2021-11-12: qty 15

## 2021-11-12 MED ORDER — PANTOPRAZOLE SODIUM 40 MG PO TBEC: 40.0000 mg | DELAYED_RELEASE_TABLET | Freq: Two times a day (BID) | ORAL | Status: DC

## 2021-11-12 MED ORDER — PANTOPRAZOLE SODIUM 40 MG PO TBEC
40.0000 mg | DELAYED_RELEASE_TABLET | Freq: Every day | ORAL | Status: DC
  Administered 2021-11-12 – 2021-11-13 (×2): 40 mg via ORAL
  Filled 2021-11-12 (×2): qty 1

## 2021-11-12 MED ORDER — MORPHINE SULFATE (PF) 2 MG/ML IV SOLN: 1.0000 mg | Freq: Once | INTRAVENOUS | Status: DC

## 2021-11-12 MED ORDER — ALUM & MAG HYDROXIDE-SIMETH 200-200-20 MG/5ML PO SUSP
30.0000 mL | Freq: Once | ORAL | Status: AC
  Administered 2021-11-12: 30 mL via ORAL
  Filled 2021-11-12: qty 30

## 2021-11-12 MED ORDER — PREDNISONE 20 MG PO TABS
40.0000 mg | ORAL_TABLET | Freq: Every day | ORAL | Status: DC
  Administered 2021-11-13: 40 mg via ORAL
  Filled 2021-11-12: qty 2

## 2021-11-12 MED ORDER — FUROSEMIDE 40 MG PO TABS
40.0000 mg | ORAL_TABLET | Freq: Two times a day (BID) | ORAL | Status: DC
  Administered 2021-11-13: 40 mg via ORAL
  Filled 2021-11-12: qty 1

## 2021-11-12 MED ORDER — LIDOCAINE VISCOUS HCL 2 % MT SOLN: 15.0000 mL | Freq: Once | OROMUCOSAL | Status: DC

## 2021-11-12 NOTE — Progress Notes (Signed)
PROGRESS NOTE  [name]  YJE:[id number] DOB: 07/28/1950 DOA: 11/07/2021 PCP: Benito Mccreedy, MD   Brief Narrative:  Patient is a 71 year old female with past medical history of COPD, chronic hypoxic respiratory failure on 2 L at home, not on insulin dependent diabetes type 2, hypertension, hyperlipidemia, GERD, coronary artery disease, peripheral vascular disease, OSA, hospitalization for GI bleed from AVMs in the past who presented with a complaint of shortness of breath.  In the emergency department, she was found to have hemoglobin of 5.2.  Hemoccult was positive.  Patient was transfused with PRBCs and admitted for further evaluation and management.  Hospital course also remarkable for fluid overload, COPD exacerbation.  Assessment & Plan:  Principal Problem:   Acute on chronic blood loss anemia Active Problems:   Acute upper gastrointestinal bleeding   Acute cardiogenic pulmonary edema (HCC)   Chronic respiratory failure with hypoxia (HCC)   Acute bacterial bronchitis   Elevated troponin level not due myocardial infarction   COPD (chronic obstructive pulmonary disease) (HCC)   Coronary artery disease involving native coronary artery of native heart without angina pectoris   Type 2 diabetes mellitus without complication, without long-term current use of insulin ([place])   Essential hypertension   Mixed diabetic hyperlipidemia associated with type 2 diabetes mellitus (HCC)   GI bleed     Acute on chronic blood loss anemia/acute upper GI bleed: Presented with progressive worsening shortness of breath.  Hemoglobin was found to be in the range of 5 on presentation, last hemoglobin was in the range of 7 several weeks ago.  She has history of chronic GI bleed.  She was admitted last time on March this year when she presented with 2-week history of melena.  She was evaluated by GI at that time and she underwent small bowel capsule endoscopy revealing nonbleeding AVMs.  GI  consulted again here.  GI is not planning for further work-up here and recommended outpatient follow-up with [facility]. She was transfused with 2 units of  PRBCs, Hb this mrng is in the range of 8.She takes aspirin and Pletal at home which we will resume on dc  Acute on chronic hypoxic respiratory failure: Likely secondary to CHF, COPD combination.  Presented with shortness of breath.  On 2-3 L of oxygen at home.  Needed 3 L here.  Chest x-ray earlier showed mild pulmonary edema. Chest x-ray done on 5/19 did not show significant pulmonary edema.  Acute on chronic diastolic congestive heart failure: BNP is elevated.  Also had lower extremity edema .  Continue IV Lasix 40 mg twice a day for today with plan for oral Lasix tomorrow.  Last echo on 09/01/21 showed EF of 63%, grade 2 diastolic dysfunction.  New echo done here showed EF of 55 to 78%, grade 1 diastolic dysfunction, diminished right ventricular function, severe elevated pulmonary artery pressure  History of COPD: Started on Solu-Medrol,continue for today.  Continue bronchodilators.  Elevated troponin: Secondary to supply demand ischemia, type II MI from anemia. History of coronary artery disease: Currently chest pain-free.  Takes aspirin, Pletal at home: Currently on hold  Type 2 diabetes: Recent hemoglobin A1c of 4.9.  Continue sliding-scale insulin.  Hypertension: Monitor blood pressure.  Takes antihypertensives at home.  Currently normotensive  Hyperlipidemia: Takes Lipitor  Hypomagnesemia: Supplemented with admission  Morbid obesity: BMI of 43.7  Debility/deconditioning: PT/OT recommended home health on discharge    DVT prophylaxis:SCDs Start: 11/08/21 0014     Code Status: Full Code  Family Communication:  Called and discussed with daughter on phone on 5/18  Patient status: Inpatient  Patient is from : Home  Anticipated discharge to: Home  Estimated DC date: tomorrow.  Currently needing IV steroids and IV  Lasix   Consultants: GI  Procedures: None yet  Antimicrobials:  Anti-infectives (From admission, onward)    Start     Dose/Rate Route Frequency Ordered Stop   11/09/21 1215  doxycycline (VIBRA-TABS) tablet 100 mg        100 mg Oral Every 12 hours 11/09/21 1121     11/08/21 0600  doxycycline (VIBRAMYCIN) 100 mg in sodium chloride 0.9 % 250 mL IVPB  Status:  Discontinued        100 mg 125 mL/hr over 120 Minutes Intravenous 2 times daily 11/08/21 0453 11/09/21 1121       Subjective: Patient seen and examined at the bedside this morning.  Feels better today.  Cough is better.  She had some chest discomfort today morning.  Volume status getting better with IV Lasix  Objective: Vitals:   11/11/21 2046 11/12/21 0344 11/12/21 0812 11/12/21 0930  BP: 135/70 (!) 132/93 126/64 (!) 140/94  Pulse: 86 83 75   Resp: 16 18 20    Temp: 98.7 F (37.1 C) 98.1 F (36.7 C) 98 F (36.7 C)   TempSrc: Oral Oral Oral   SpO2: 100% 96% 100%   Weight:      Height:        Intake/Output Summary (Last 24 hours) at 11/12/2021 1119 Last data filed at 11/12/2021 0600 Gross per 24 hour  Intake 691.07 ml  Output 3450 ml  Net -2758.93 ml   Filed Weights   11/08/21 0131  Weight: 101.7 kg    Examination:  General exam: Overall comfortable, not in distress,obese HEENT: PERRL Respiratory system: Diminished sounds bilaterally Cardiovascular system: S1 & S2 heard, RRR.  Gastrointestinal system: Abdomen is nondistended, soft and nontender. Central nervous system: Alert and oriented Extremities: trace lower extremity edema, no clubbing ,no cyanosis Skin: No rashes, no ulcers,no icterus       Data Reviewed: I have personally reviewed following labs and imaging studies  CBC: Recent Labs  Lab 11/07/21 1852 11/08/21 0937 11/08/21 2157 11/09/21 1019 11/10/21 0219 11/11/21 0127 11/12/21 0407  WBC 12.2*  --   --   --   --   --   --   HGB 5.2*   < > 7.5* 7.9* 7.7* 8.0* 8.9*  HCT 21.0*   < >  25.9* 29.3* 28.7* 28.9* 32.5*  MCV 78.1*  --   --   --   --   --   --   PLT 444*  --   --   --   --   --   --    < > = values in this interval not displayed.   Basic Metabolic Panel: Recent Labs  Lab 11/08/21 0937 11/09/21 1218 11/10/21 0219 11/11/21 0127 11/11/21 0555 11/12/21 0407  NA 136 139 141 139 141 141  K 3.7 3.9 3.6 4.1 3.6 3.6  CL 103 103 102 98 97* 95*  CO2 26 26 32 35* 37* 39*  GLUCOSE 112* 125* 107* 109* 99 111*  BUN 11 14 16 21 19 20   CREATININE 0.69 0.76 0.78 0.82 0.77 0.81  CALCIUM 8.9 8.9 8.9 8.9 9.0 9.2  MG 1.7  --   --   --  1.6*  --      Recent Results (from the past 240 hour(s))  Resp Panel  by RT-PCR (Flu A&B, Covid) Nasopharyngeal Swab     Status: None   Collection Time: 11/07/21  5:56 PM   Specimen: Nasopharyngeal Swab; Nasopharyngeal(NP) swabs in vial transport medium  Result Value Ref Range Status   SARS Coronavirus 2 by RT PCR NEGATIVE NEGATIVE Final    Comment: (NOTE) SARS-CoV-2 target nucleic acids are NOT DETECTED.  The SARS-CoV-2 RNA is generally detectable in upper respiratory specimens during the acute phase of infection. The lowest concentration of SARS-CoV-2 viral copies this assay can detect is 138 copies/mL. A negative result does not preclude SARS-Cov-2 infection and should not be used as the sole basis for treatment or other patient management decisions. A negative result may occur with  improper specimen collection/handling, submission of specimen other than nasopharyngeal swab, presence of viral mutation(s) within the areas targeted by this assay, and inadequate number of viral copies(<138 copies/mL). A negative result must be combined with clinical observations, patient history, and epidemiological information. The expected result is Negative.  Fact Sheet for Patients:  EntrepreneurPulse.com.au  Fact Sheet for Healthcare Providers:  IncredibleEmployment.be  This test is no t yet approved  or cleared by the [place] FDA and  has been authorized for detection and/or diagnosis of SARS-CoV-2 by FDA under an Emergency Use Authorization (EUA). This EUA will remain  in effect (meaning this test can be used) for the duration of the COVID-19 declaration under Section 564(b)(1) of the Act, 21 U.S.C.section 360bbb-3(b)(1), unless the authorization is terminated  or revoked sooner.       Influenza A by PCR NEGATIVE NEGATIVE Final   Influenza B by PCR NEGATIVE NEGATIVE Final    Comment: (NOTE) The Xpert Xpress SARS-CoV-2/FLU/RSV plus assay is intended as an aid in the diagnosis of influenza from Nasopharyngeal swab specimens and should not be used as a sole basis for treatment. Nasal washings and aspirates are unacceptable for Xpert Xpress SARS-CoV-2/FLU/RSV testing.  Fact Sheet for Patients: EntrepreneurPulse.com.au  Fact Sheet for Healthcare Providers: IncredibleEmployment.be  This test is not yet approved or cleared by the [place] FDA and has been authorized for detection and/or diagnosis of SARS-CoV-2 by FDA under an Emergency Use Authorization (EUA). This EUA will remain in effect (meaning this test can be used) for the duration of the COVID-19 declaration under Section 564(b)(1) of the Act, 21 U.S.C. section 360bbb-3(b)(1), unless the authorization is terminated or revoked.  Performed at [facility], [place] [address] [place], [place] [address]      Radiology Studies: No results found.  Scheduled Meds:  atorvastatin  80 mg Oral Daily   doxycycline  100 mg Oral Q12H   ezetimibe  10 mg Oral Daily   furosemide  40 mg Intravenous Q12H   gabapentin  600 mg Oral BID   guaiFENesin  1,200 mg Oral BID   guaiFENesin-dextromethorphan  10 mL Oral Q6H   insulin aspart  0-9 Units Subcutaneous TID WC   methylPREDNISolone (SOLU-MEDROL) injection  80 mg Intravenous Q24H   mirabegron ER  25 mg Oral Daily   Continuous  Infusions:  sodium chloride     magnesium sulfate bolus IVPB Stopped (11/11/21 1141)     LOS: 4 days   Shelly Coss, MD [facility] P5/22/2023, 11:19 AM

## 2021-11-12 NOTE — TOC Progression Note (Signed)
Transition of Care [facility]) - Progression Note    Patient Details  Name: Shameria Trimarco MRN: [id number] Date of Birth: 04-Nov-1950  Transition [facility] CM/SW Contact  Bartholomew Crews, RN Phone Number: [phone] 11/12/2021, 4:20 PM  Clinical Narrative:     [facility] PT referral accepted by [facility]. HH/Face to Face order in place. TOC following for transition needs.   Expected Discharge Plan: [place] Barriers to Discharge: Continued Medical Work up  Expected Discharge Plan and Services Expected Discharge Plan: [place] In-house Referral: NA Discharge Planning Services: CM Consult Post Acute Care Choice: [place] arrangements for the past 2 months: Single Family Home                 DME Arranged: N/A DME Agency: NA       HH Arranged: PT HH Agency: [place] Date [place]: 11/12/21 Time [place]: 1620 Representative spoke with at [place]: [place] ([place]) Interventions    Readmission Risk Interventions    08/28/2021   10:31 AM  Readmission Risk Prevention Plan  Transportation Screening Complete  PCP or Specialist Appt within 3-5 Days Complete  HRI or [place] Complete  Social Work Consult for [place] Planning/Counseling Complete  Palliative Care Screening Complete  Medication Review [employer]) Complete

## 2021-11-12 NOTE — TOC Initial Note (Signed)
Transition of Care [facility]) - Initial/Assessment Note    Patient Details  Name: [name] MRN: [id number] Date of Birth: Sep 13, 1950  Transition of Care [facility]) CM/SW Contact:    Bartholomew Crews, RN Phone Number: [phone] 11/12/2021, 2:00 PM  Clinical Narrative:                  Spoke with patient at the bedside to discuss post acute transition. Demographics and PCP verified as correct in Epic. Patient lives with daughter and family.   Patient has a rollator, cane, and home oxygen from [employer].   Discussed recommendations for [facility] PT, patient is agreeable - referral pending acceptance by a home health agency. Once accepted, patient will need HH PT Face to Face order.   Patient asked about going somewhere for a break. Advised that this would be custodial care and would be private pay or Medicaid. She stated that her daughter was looking into it.   Family to provide transportation home.   Expected Discharge Plan: [place] Barriers to Discharge: Continued Medical Work up   Patient Goals and CMS Choice Patient states their goals for this hospitalization and ongoing recovery are:: home with family CMS Medicare.gov Compare Post Acute Care list provided to:: Patient Choice offered to / list presented to : Patient  Expected Discharge Plan and Services Expected Discharge Plan: [place] In-house Referral: NA Discharge Planning Services: CM Consult Post Acute Care Choice: [place] arrangements for the past 2 months: Single Family Home                 DME Arranged: N/A DME Agency: NA       HH Arranged: PT          Prior Living Arrangements/Services Living arrangements for the past 2 months: Single Family Home Lives with:: Self, Adult Children Patient language and need for interpreter reviewed:: Yes Do you feel safe going back to the place where you live?: Yes      Need for Family Participation in Patient Care: Yes  (Comment) Care giver support system in place?: Yes (comment) Current home services: DME (rollator; SPC; oxygen ([employer])) Criminal Activity/Legal Involvement Pertinent to Current Situation/Hospitalization: No - Comment as needed  Activities of Daily Living Home Assistive Devices/Equipment: Cane (specify quad or straight), Walker (specify type) ADL Screening (condition at time of admission) Patient's cognitive ability adequate to safely complete daily activities?: Yes Is the patient deaf or have difficulty hearing?: No Does the patient have difficulty seeing, even when wearing glasses/contacts?: No Does the patient have difficulty concentrating, remembering, or making decisions?: No Patient able to express need for assistance with ADLs?: Yes Does the patient have difficulty dressing or bathing?: Yes Independently performs ADLs?: No Communication: Independent Dressing (OT): Needs assistance Is this a change from baseline?: Pre-admission baseline Grooming: Independent Feeding: Independent Bathing: Needs assistance Is this a change from baseline?: Pre-admission baseline Toileting: Needs assistance Is this a change from baseline?: Pre-admission baseline In/Out Bed: Needs assistance Is this a change from baseline?: Pre-admission baseline Walks in Home: Needs assistance Is this a change from baseline?: Pre-admission baseline Does the patient have difficulty walking or climbing stairs?: Yes Weakness of Legs: Both Weakness of Arms/Hands: None  Permission Sought/Granted                  Emotional Assessment Appearance:: Appears stated age Attitude/Demeanor/Rapport: Engaged Affect (typically observed): Accepting Orientation: : Oriented to Self, Oriented to Place, Oriented to  Time, Oriented to Situation Alcohol / Substance Use: Not Applicable Psych Involvement: No (comment)  Admission diagnosis:  Acute upper GI bleed [K92.2] Gastrointestinal hemorrhage, unspecified  gastrointestinal hemorrhage type [K92.2] Anemia, unspecified type [D64.9] GI bleed [K92.2] Patient Active Problem List   Diagnosis Date Noted   Acute bacterial bronchitis 11/08/2021   GI bleed 11/08/2021   Elevated troponin level not due myocardial infarction 11/07/2021   Acute cardiogenic pulmonary edema ([place]) 11/07/2021   Chronic respiratory failure with hypoxia ([place]) 11/07/2021   Mixed diabetic hyperlipidemia associated with type 2 diabetes mellitus ([place]) 11/07/2021   Melena    Anemia due to chronic blood loss    AVM (arteriovenous malformation) of small bowel, acquired with hemorrhage    Acute on chronic blood loss anemia 09/21/2021   Thrombocytosis 09/21/2021   Type 2 diabetes mellitus without complication, without long-term current use of insulin ([place]) 09/21/2021   Class 3 obesity ([place]) 08/27/2021   Acute upper gastrointestinal bleeding 05/09/2021   COPD (chronic obstructive pulmonary disease) ([place])    Coronary artery disease involving native coronary artery of native heart without angina pectoris    Osteoarthritis of left knee 04/18/2020   Tobacco use disorder 09/18/2018   Iron deficiency anemia 04/09/2018   Chronic anemia 12/26/2015   PVD (peripheral vascular disease) ([place]) 10/05/2013   Discomfort in chest 05/11/2012   Nicotine dependence 05/11/2012   Bradycardia 05/11/2012   Essential hypertension 05/11/2012   Back pain 05/11/2012   PCP:  Benito Mccreedy, MD Pharmacy:   [facility] ([facility] ) - [place], [place] - [phone] [place] [address] Phone: [phone] Fax: [place], [place] - [address] AT [facility] [place] [address] Phone: [phone] Fax: [phone]     Social Determinants of Health (SDOH) Interventions    Readmission Risk Interventions    08/28/2021   10:31 AM  Readmission Risk  Prevention Plan  Transportation Screening Complete  PCP or Specialist Appt within 3-5 Days Complete  HRI or Home Care Consult Complete  Social Work Consult for [place] Planning/Counseling Complete  Palliative Care Screening Complete  Medication Review Press photographer) Complete

## 2021-11-12 NOTE — Care Management Important Message (Signed)
Important Message  Patient Details  Name: [name] MRN: [id number] Date of Birth: [birth year]   Medicare Important Message Given:  Yes     [name] [address] Circle 11/12/2021, 3:50 PM

## 2021-11-12 NOTE — Progress Notes (Signed)
Occupational Therapy Treatment Patient Details Name: [name] MRN: [id number] DOB: 1950-12-23 Today's Date: 11/12/2021   History of present illness Pt is a 70 y/o female admitted secondary to anemia likely from GI bleed. PMH includes HTN, COPD on supplemental O2, CAD, DM, GI bleed, and CHF.   OT comments  Pt making excellent progress towards OT goals, able to mobilize and complete ADLs assessed without AD and no more than Setup Assist. Emphasis on consistent O2 wear at home as pt surprised to be asymptomatic during session with consistent supplemental O2 wear. Collaborated on energy conservation strategies for ADLs/IADLs with pt implementing many of these strategies already. Based on progress, updated DC recs to no OT follow-up needed. Will focus next sessions on breathing techniques and O2 cord mgmt during functional tasks.    Recommendations for follow up therapy are one component of a multi-disciplinary discharge planning process, led by the attending physician.  Recommendations may be updated based on patient status, additional functional criteria and insurance authorization.    Follow Up Recommendations  No OT follow up    Assistance Recommended at Discharge Set up Supervision/Assistance  Patient can return home with the following  Assistance with cooking/housework;Assist for transportation;Help with stairs or ramp for entrance;Direct supervision/assist for medications management;Direct supervision/assist for financial management   Equipment Recommendations  Other (comment) (pt reports she has already ordered a shower chair online)    Recommendations for Other Services      Precautions / Restrictions Precautions Precautions: Fall Precaution Comments: monitor O2 (wears 2-3 L O2 at baseline per pt) Restrictions Weight Bearing Restrictions: No       Mobility Bed Mobility               General bed mobility comments: up in chair on entry    Transfers Overall  transfer level: Independent Equipment used: None Transfers: Sit to/from Stand Sit to Stand: Independent                 Balance Overall balance assessment: No apparent balance deficits (not formally assessed)                                         ADL either performed or assessed with clinical judgement   ADL Overall ADL's : Needs assistance/impaired     Grooming: Set up;Standing;Oral care;Applying deodorant;Wash/dry face Grooming Details (indicate cue type and reason): no AD and no safety concerns                             Functional mobility during ADLs: Set up General ADL Comments: Emphasis on energy conservation education for ADLs/IADLs at home. Pt reports she has ordered a shower chair at home, leans on sink in kitchen for rest breaks. Encouraged pt to sit at table in kitchen for meal prep, rest breaks. Educated to wear O2 with activities due to reported SOB during tasks though often removes and not wearing it at home    Extremity/Trunk Assessment Upper Extremity Assessment Upper Extremity Assessment: Overall WFL for tasks assessed   Lower Extremity Assessment Lower Extremity Assessment: Defer to PT evaluation        Vision   Vision Assessment?: No apparent visual deficits   Perception     Praxis      Cognition Arousal/Alertness: Awake/alert Behavior During Therapy: WFL for tasks assessed/performed, Agitated Overall Cognitive  Status: Within Functional Limits for tasks assessed                                 General Comments: likely at baseline, some decreased insight into medical complexities (was planning to remove O2 to walk to bathroom, removes it at home despite reported SOB), also insistent on keeping purewick despite pt independently managing it, ambulating without assist        Exercises      Shoulder Instructions       General Comments VSS on 2-3 L O2    Pertinent Vitals/ Pain       Pain  Assessment Pain Assessment: No/denies pain  Home Living                                          Prior Functioning/Environment              Frequency  Min 2X/week        Progress Toward Goals  OT Goals(current goals can now be found in the care plan section)  Progress towards OT goals: Progressing toward goals  Acute Rehab OT Goals Patient Stated Goal: improve breathing, walk in hallway OT Goal Formulation: With patient Time For Goal Achievement: 11/24/21 Potential to Achieve Goals: Good ADL Goals Pt Will Perform Lower Body Dressing: sit to/from stand;sitting/lateral leans;Independently Pt Will Transfer to Toilet: Independently;ambulating Additional ADL Goal #1: Pt to demo ability to manage O2 cord Independently during ADLs/mobility to maximize O2 compliance at home Additional ADL Goal #2: Pt to verbalize at least 3 energy conservation strategies to implement during ADLs/IADLs  Plan Discharge plan needs to be updated    Co-evaluation                 AM-PAC OT "6 Clicks" Daily Activity     Outcome Measure   Help from another person eating meals?: None Help from another person taking care of personal grooming?: A Little Help from another person toileting, which includes using toliet, bedpan, or urinal?: A Little Help from another person bathing (including washing, rinsing, drying)?: A Little Help from another person to put on and taking off regular upper body clothing?: A Little Help from another person to put on and taking off regular lower body clothing?: A Little 6 Click Score: 19    End of Session Equipment Utilized During Treatment: Oxygen  OT Visit Diagnosis: Unsteadiness on feet (R26.81);Other abnormalities of gait and mobility (R26.89);Muscle weakness (generalized) (M62.81)   Activity Tolerance Patient tolerated treatment well   Patient Left in chair;with call bell/phone within reach   Nurse Communication Mobility status;Other  (comment) (O2, no need for purewick)        Time: 6063-0160 OT Time Calculation (min): 19 min  Charges: OT General Charges $OT Visit: 1 Visit OT Treatments $Self Care/Home Management : 8-22 mins  Malachy Chamber, OTR/L Acute Rehab Services Office: [phone]   Layla Maw 11/12/2021, 12:30 PM

## 2021-11-12 NOTE — Progress Notes (Signed)
Received a call regarding the patient having chest pain, 8/10 and radiating to her left arm.  Presented at bedside and patient is having epigastric tenderness with palpation that is radiating to her chest centrally and her left arm.  After a few minutes of being in the room, she belched and her chest pain completely resolved.  Reviewed her medical records.  It revealed multiple small mucosal duodenal nodules seen on small bowel endoscopy done on 09/23/2021.  Patient also has a history of bleeding gastric, small bowel AVM status post APC on 01/08/2013.  Twelve-lead EKG done at bedside with no evidence of acute ischemia.  No QTc prolongation.  Restarted home PPI, Protonix 40 mg daily.  Added GI cocktail as needed.  Recommend close follow-up with GI outpatient due to epigastric tenderness with palpation, chronic iron deficiency anemia, and chronic blood loss anemia.  The patient is on doxycycline for COPD exacerbation.  No documented history of dysmotility or stricture which is reassuring.  Advised the patient with bedside RN present in the room to sit up and remain upright for a few minutes when taking doxycycline, with plenty of water to avoid pill esophagitis.  We will continue to closely monitor and treat as indicated.

## 2021-11-13 ENCOUNTER — Encounter: Payer: Self-pay | Admitting: Internal Medicine

## 2021-11-13 ENCOUNTER — Other Ambulatory Visit (HOSPITAL_COMMUNITY): Payer: Self-pay

## 2021-11-13 DIAGNOSIS — D62 Acute posthemorrhagic anemia: Secondary | ICD-10-CM | POA: Diagnosis not present

## 2021-11-13 LAB — BASIC METABOLIC PANEL
Anion gap: 9 (ref 5–15)
BUN: 24 mg/dL — ABNORMAL HIGH (ref 8–23)
CO2: 39 mmol/L — ABNORMAL HIGH (ref 22–32)
Calcium: 9.3 mg/dL (ref 8.9–10.3)
Chloride: 91 mmol/L — ABNORMAL LOW (ref 98–111)
Creatinine, Ser: 0.77 mg/dL (ref 0.44–1.00)
GFR, Estimated: >60 mL/min (ref >60)
Glucose, Bld: 126 mg/dL — ABNORMAL HIGH (ref 70–99)
Potassium: 3.4 mmol/L — ABNORMAL LOW (ref 3.5–5.1)
Sodium: 139 mmol/L (ref 135–145)

## 2021-11-13 LAB — GLUCOSE, CAPILLARY
Glucose-Capillary: 109 mg/dL — ABNORMAL HIGH (ref 70–99)
Glucose-Capillary: 122 mg/dL — ABNORMAL HIGH (ref 70–99)

## 2021-11-13 MED ORDER — LOSARTAN POTASSIUM 25 MG PO TABS
25.0000 mg | ORAL_TABLET | Freq: Every day | ORAL | 0 refills | Status: DC
  Filled 2021-11-13: qty 30, 30d supply, fill #0

## 2021-11-13 MED ORDER — POTASSIUM CHLORIDE CRYS ER 20 MEQ PO TBCR
20.0000 meq | EXTENDED_RELEASE_TABLET | Freq: Every day | ORAL | 0 refills | Status: DC
  Filled 2021-11-13: qty 30, 30d supply, fill #0

## 2021-11-13 MED ORDER — POTASSIUM CHLORIDE CRYS ER 20 MEQ PO TBCR
40.0000 meq | EXTENDED_RELEASE_TABLET | Freq: Once | ORAL | Status: AC
  Administered 2021-11-13: 40 meq via ORAL
  Filled 2021-11-13: qty 2

## 2021-11-13 MED ORDER — ALBUTEROL SULFATE HFA 108 (90 BASE) MCG/ACT IN AERS
1.0000 | INHALATION_SPRAY | Freq: Four times a day (QID) | RESPIRATORY_TRACT | 1 refills | Status: AC | PRN
  Filled 2021-11-13: qty 8.5, 25d supply, fill #0

## 2021-11-13 MED ORDER — FLUTICASONE FUROATE-VILANTEROL 100-25 MCG/ACT IN AEPB
1.0000 | INHALATION_SPRAY | Freq: Every day | RESPIRATORY_TRACT | 1 refills | Status: DC
  Filled 2021-11-13: qty 60, 30d supply, fill #0

## 2021-11-13 MED ORDER — PREDNISONE 10 MG PO TABS
10.0000 mg | ORAL_TABLET | Freq: Every day | ORAL | 0 refills | Status: DC
  Filled 2021-11-13: qty 26, 11d supply, fill #0

## 2021-11-13 MED ORDER — FUROSEMIDE 40 MG PO TABS
40.0000 mg | ORAL_TABLET | Freq: Every day | ORAL | 1 refills | Status: DC
  Filled 2021-11-13: qty 30, 30d supply, fill #0

## 2021-11-13 MED ORDER — GUAIFENESIN ER 600 MG PO TB12
600.0000 mg | ORAL_TABLET | Freq: Two times a day (BID) | ORAL | 0 refills | Status: AC
  Filled 2021-11-13: qty 14, 7d supply, fill #0

## 2021-11-13 MED ORDER — METOPROLOL TARTRATE 25 MG PO TABS
25.0000 mg | ORAL_TABLET | Freq: Two times a day (BID) | ORAL | 11 refills | Status: DC
  Filled 2021-11-13: qty 60, 30d supply, fill #0

## 2021-11-13 MED ORDER — IPRATROPIUM-ALBUTEROL 0.5-2.5 (3) MG/3ML IN SOLN
3.0000 mL | Freq: Three times a day (TID) | RESPIRATORY_TRACT | 1 refills | Status: DC
  Filled 2021-11-13: qty 90, 10d supply, fill #0

## 2021-11-13 NOTE — Progress Notes (Signed)
Physical Therapy Treatment Patient Details Name: [name] MRN: [id number] DOB: [birth year] Today's Date: 11/13/2021   History of Present Illness Pt is a 71 y/o female admitted secondary to anemia likely from GI bleed. PMH includes HTN, COPD on supplemental O2, CAD, DM, GI bleed, and CHF.    PT Comments    Pt received supine and agreeable to session with continued progress towards goals. Pt able to demonstrate all bed mobility and transfers at modified independent level with light assist to manage lines, and ambulation with supervision for safety. Emphasis throughout session on continued O2 wear at home, pacing, and energy conservation at pt continues to fatigue quickly, pt verbalizing understanding. Pt able to ascend/descend 5 steps in stairwell without fault with light cues for step-to-step negotiation for increased safety and stability. Pt continues to benefit from skilled PT services to progress toward functional mobility goals.     Recommendations for follow up therapy are one component of a multi-disciplinary discharge planning process, led by the attending physician.  Recommendations may be updated based on patient status, additional functional criteria and insurance authorization.  Follow Up Recommendations  Home health PT     Assistance Recommended at Discharge Intermittent Supervision/Assistance  Patient can return home with the following Assistance with cooking/housework   Equipment Recommendations  None recommended by PT    Recommendations for Other Services       Precautions / Restrictions Precautions Precautions: Fall Precaution Comments: monitor O2 (wears 2-3 L O2 at baseline per pt) Restrictions Weight Bearing Restrictions: No     Mobility  Bed Mobility Overal bed mobility: Modified Independent                  Transfers Overall transfer level: Independent Equipment used: None Transfers: Sit to/from Stand Sit to Stand: Independent                 Ambulation/Gait Ambulation/Gait assistance: Supervision Gait Distance (Feet): 220 Feet (x 2 with seated recovery break) Assistive device: None Gait Pattern/deviations: Step-through pattern, Decreased stride length Gait velocity: Decreased     General Gait Details: Seated rest x1 and standing rest X1 secondary to fatigue. Supervision for safety. Encouraged pacing, gait slowing with fatigue   Stairs Stairs: Yes Stairs assistance: Min guard Stair Management: One rail Right, One rail Left, Alternating pattern, Step to pattern, Forwards Number of Stairs: 5 General stair comments: up/down stairs in stairwell, alternating pattern up, step pattern down for safety, no LOB   Wheelchair Mobility    Modified Rankin (Stroke Patients Only)       Balance Overall balance assessment: No apparent balance deficits (not formally assessed)                                          Cognition Arousal/Alertness: Awake/alert Behavior During Therapy: WFL for tasks assessed/performed, Agitated Overall Cognitive Status: Within Functional Limits for tasks assessed                                 General Comments: some decreased insight, encouraged pacing and importance of O2 wear at home        Exercises      General Comments General comments (skin integrity, edema, etc.): VSS on 2L O2      Pertinent Vitals/Pain Pain Assessment Pain Assessment: No/denies pain  Home Living                          Prior Function            PT Goals (current goals can now be found in the care plan section) Acute Rehab PT Goals Patient Stated Goal: to go home PT Goal Formulation: With patient Time For Goal Achievement: 11/23/21    Frequency    Min 2X/week      PT Plan      Co-evaluation              AM-PAC PT "6 Clicks" Mobility   Outcome Measure  Help needed turning from your back to your side while in a flat bed without  using bedrails?: None Help needed moving from lying on your back to sitting on the side of a flat bed without using bedrails?: None Help needed moving to and from a bed to a chair (including a wheelchair)?: A Little Help needed standing up from a chair using your arms (e.g., wheelchair or bedside chair)?: A Little Help needed to walk in hospital room?: A Little Help needed climbing 3-5 steps with a railing? : A Little 6 Click Score: 20    End of Session Equipment Utilized During Treatment: Gait belt;Oxygen Activity Tolerance: Patient tolerated treatment well Patient left: in bed;with call bell/phone within reach Nurse Communication: Mobility status PT Visit Diagnosis: Other abnormalities of gait and mobility (R26.89)     Time: 2458-0998 PT Time Calculation (min) (ACUTE ONLY): 23 min  Charges:  $Gait Training: 23-37 mins                     Makinzi Prieur R. PTA Acute Rehabilitation Services Office: [place] 11/13/2021, 9:27 AM

## 2021-11-13 NOTE — Plan of Care (Signed)

## 2021-11-13 NOTE — Consult Note (Signed)
   [facility] Inpatient Consult   11/13/2021  [name] September 07, 1950 [id number]   [facility] [ACO] Patient: [employer] Medicare  Primary Care Provider:  Benito Mccreedy, MD [facility]   Patient is currently active with [facility] for chronic disease management services.  Patient has been engaged by a [facility].  Our community based plan of care has focused on disease management and community resource support.    Patient will receive a post hospital call and will be evaluated for assessments and disease process education.    Plan:  Ongoing community follow up and updates to [place].  Of note, [facility] Care Management services does not replace or interfere with any services that are needed or arranged by inpatient [facility] care management team.  For additional questions or referrals please contact:  Natividad Brood, RN BSN [facility] Liaison  [phone] business mobile phone Toll free office [phone]  Fax number: [phone] [email or web address]

## 2021-11-13 NOTE — Progress Notes (Signed)
At around 2150, patient c/o chest pain, rated 7/10. BP 123/80 MAP 93, HR 97, RR 21, O2Sat 98% on 2LNC. On call provider notified, EKG done, nitro given x3. Pain increased to 9/10 and radiating to left arm. While provider assessed patient, patient burped and chest pain was gone. Maalox given, per order.  Around 0100, pt again c/o chest pain, rated 8/10, and on call provider notified. Patient was given diet lemon lime soda, patient belched and pain gone. No other incidents after. Will continue to monitor.

## 2021-11-13 NOTE — Discharge Summary (Addendum)
Physician Discharge Summary  [name] VXB:[id number] DOB: 05-Apr-1951 DOA: 11/07/2021  PCP: Benito Mccreedy, MD  Admit date: 11/07/2021 Discharge date: 11/13/2021  Admitted From: Home Disposition:  Home  Discharge Condition:Stable CODE STATUS:FULL Diet recommendation: Heart Healthy    Brief/Interim Summary:  Patient is a 71 year old female with past medical history of COPD, chronic hypoxic respiratory failure on 2 L at home, not on insulin dependent diabetes type 2, hypertension, hyperlipidemia, GERD, coronary artery disease, peripheral vascular disease, OSA, hospitalization for GI bleed from AVMs in the past who presented with a complaint of shortness of breath.  In the emergency department, she was found to have hemoglobin of 5.2.  Hemoccult was positive.  Patient was transfused with PRBCs and admitted for further evaluation and management.  Hospital course also remarkable for fluid overload, COPD exacerbation needing IV Lasix, IV steroids.  Currently overall status has improved.  She is medically stable for discharge home today.  She will follow-up with cardiology as an outpatient.  Following problems were addressed during her hospitalization:   Acute on chronic blood loss anemia/acute upper GI bleed: Presented with progressive worsening shortness of breath.  Hemoglobin was found to be in the range of 5 on presentation, last hemoglobin was in the range of 7 several weeks ago.  She has history of chronic GI bleed.  She was admitted last time on March this year when she presented with 2-week history of melena.  She was evaluated by GI at that time and she underwent small bowel capsule endoscopy revealing nonbleeding AVMs.  GI consulted again here.  GI is not planning for further work-up here and recommended outpatient follow-up with [facility]. She was transfused with 2 units of  PRBCs, Hb is in the range of 8.She takes aspirin  which we will resume on dc but discontinue pletal    Acute on chronic hypoxic respiratory failure: Likely secondary to CHF, COPD combination.  Presented with shortness of breath.  On 2-3 L of oxygen at home.  Needed 3 L here.  Chest x-ray earlier showed mild pulmonary edema. Chest x-ray done on 5/19 did not show significant pulmonary edema.   Acute on chronic diastolic congestive heart failure: BNP is elevated.  Also had lower extremity edema . She was on iv lasix.  Last echo on 09/01/21 showed EF of 33%, grade 2 diastolic dysfunction.  New echo done here showed EF of 55 to 00%, grade 1 diastolic dysfunction, diminished right ventricular function, severe elevated pulmonary artery pressure.Continue Lasix 40 mg daily at home along with potassium..  Continue spironolactone   History of COPD: Started on Solu-Medrol.  Steroids changed to oral.  Continue DuoNeb at home as needed.  Also prescribed Symbicort, albuterol inhaler.  She needs  to follow-up with pulmonology as an outpatient   elevated troponin: Secondary to supply demand ischemia, type II MI from anemia.No plan for further work-up  History of coronary artery disease: Hospital course remarkable for on and off chest pain, atypical.  Continue aspirin on discharge.  She will follow-up with Dr. Arleta Creek as an outpatient.  Continue losartan, metoprolol   type 2 diabetes: Recent hemoglobin A1c of 4.9.    Hypertension: Monitor blood pressure.   Currently normotensive.  Hydralazine discontinued.  Resumed losartan and metoprolol at low-dose.   Hyperlipidemia: Takes Lipitor   Morbid obesity: BMI of 43.7   Debility/deconditioning: PT recommended home health on discharge       Discharge Diagnoses:  Principal Problem:   Acute on chronic blood loss  anemia Active Problems:   Acute upper gastrointestinal bleeding   Acute cardiogenic pulmonary edema (HCC)   Chronic respiratory failure with hypoxia (HCC)   Acute bacterial bronchitis   Elevated troponin level not due myocardial infarction   COPD (chronic  obstructive pulmonary disease) (HCC)   Coronary artery disease involving native coronary artery of native heart without angina pectoris   Type 2 diabetes mellitus without complication, without long-term current use of insulin ([place])   Essential hypertension   Mixed diabetic hyperlipidemia associated with type 2 diabetes mellitus ([place])   GI bleed    Discharge Instructions  Discharge Instructions     Diet - low sodium heart healthy   Complete by: As directed    Discharge instructions   Complete by: As directed    1)Please take prescribed medications as instructed 2)Follow up with your PCP in a week.Do a BMP test during the follow up 3)Follow up with cardiology on 11/27/21.Name and number of the provider has been attached 4)Follow up with pulmonology as an outpatient. Call for appointment,name and number of the provider has been attached 5)Follow up with [facility]. 6)Restrict fluid intake to less than 1.2 L a  day and salt intake to less than 2 gm a day.Monitor ur weight at home   Increase activity slowly   Complete by: As directed       Allergies as of 11/13/2021   No Known Allergies      Medication List     STOP taking these medications    amLODipine 10 MG tablet Commonly known as: NORVASC   cilostazol 100 MG tablet Commonly known as: PLETAL   hydrALAZINE 100 MG tablet Commonly known as: APRESOLINE   meloxicam 7.5 MG tablet Commonly known as: MOBIC   methocarbamol 500 MG tablet Commonly known as: ROBAXIN       TAKE these medications    acetaminophen 500 MG tablet Commonly known as: TYLENOL Take 1,000 mg by mouth daily as needed for moderate pain.   albuterol (2.5 MG/3ML) 0.083% nebulizer solution Commonly known as: PROVENTIL Take 2.5 mg by nebulization every 6 (six) hours as needed for wheezing or shortness of breath.   albuterol 108 (90 Base) MCG/ACT inhaler Commonly known as: VENTOLIN HFA Inhale 1-2 puffs into the lungs every 6 (six) hours  as needed for wheezing or shortness of breath.   Aspirin Low Dose 81 MG tablet Generic drug: aspirin EC TAKE 1 TABLET(81 MG) BY MOUTH DAILY What changed: See the new instructions.   atorvastatin 80 MG tablet Commonly known as: LIPITOR Take 80 mg by mouth daily.   Breo Ellipta 100-25 MCG/ACT Aepb Generic drug: fluticasone furoate-vilanterol Inhale 1 puff into the lungs daily.   ezetimibe 10 MG tablet Commonly known as: ZETIA TAKE 1 TABLET BY MOUTH DAILY   ferrous sulfate 325 (65 FE) MG tablet Take 325 mg by mouth daily.   fluticasone 50 MCG/ACT nasal spray Commonly known as: FLONASE Place 1 spray into both nostrils daily as needed for allergies.   furosemide 40 MG tablet Commonly known as: Lasix Take 1 tablet (40 mg total) by mouth daily.   gabapentin 600 MG tablet Commonly known as: NEURONTIN Take 1 tablet (600 mg total) by mouth at bedtime.   ipratropium-albuterol 0.5-2.5 (3) MG/3ML Soln Commonly known as: DUONEB Take 3 mLs by nebulization 3 (three) times daily.   losartan 25 MG tablet Commonly known as: Cozaar Take 1 tablet (25 mg total) by mouth daily. What changed:  medication strength how much to  take   metFORMIN 500 MG tablet Commonly known as: GLUCOPHAGE Take 0.5 tablets (250 mg total) by mouth 2 (two) times daily with a meal.   metoprolol tartrate 25 MG tablet Commonly known as: LOPRESSOR Take 1 tablet (25 mg total) by mouth 2 (two) times daily. What changed:  medication strength how much to take   Myrbetriq 25 MG Tb24 tablet Generic drug: mirabegron ER Take 25 mg by mouth daily.   nitroGLYCERIN 0.4 MG SL tablet Commonly known as: NITROSTAT Place 1 tablet (0.4 mg total) under the tongue every 5 (five) minutes as needed for chest pain.   OXYGEN Inhale 2 L into the lungs continuous.   pantoprazole 40 MG tablet Commonly known as: PROTONIX Take 1 tablet (40 mg total) by mouth 2 (two) times daily.   potassium chloride SA 20 MEQ  tablet Commonly known as: KLOR-CON M Take 1 tablet (20 mEq total) by mouth daily.   predniSONE 10 MG tablet Commonly known as: DELTASONE Take 4 tabs daily for 2 days then 3 tabs daily for 3 days then 2 tabs daily for 3 days then 1 tab daily for 3 days then stop Start taking on: Nov 14, 2021   SM Mucus Relief 600 MG 12 hr tablet Generic drug: guaiFENesin Take 1 tablet (600 mg total) by mouth 2 (two) times daily for 7 days.   spironolactone 50 MG tablet Commonly known as: ALDACTONE TAKE 1 TABLET(50 MG) BY MOUTH DAILY What changed: See the new instructions.        Follow-up Information     Benito Mccreedy, MD Follow up.   Specialty: Internal Medicine Contact information: [address] [place] [address] [phone]         [facility] [place] Follow up.   Specialty: [place] Why: Someone from the office will call to schedule home health physical therapy visits Contact information: [place] [address] [phone]         Rex Kras, DO Follow up on 11/27/2021.   Specialties: Cardiology, Vascular Surgery Why: 11/27/21 @2 :30 PM. Bring all medications to the appointment Contact information: [place] [address] [phone]         Garner Nash, DO. Schedule an appointment as soon as possible for a visit in 2 week(s).   Specialty: Pulmonary Disease Contact information: [address] [place]  [address] [phone]                No Known Allergies  Consultations: Cardiology   Procedures/Studies: DG Chest 2 View  Result Date: 11/07/2021 CLINICAL DATA:  Shortness of breath EXAM: CHEST - 2 VIEW COMPARISON:  Chest radiograph dated 09/21/2021. FINDINGS: The heart is enlarged. Vascular calcifications are seen in the aortic arch. Moderate bilateral lower lung predominant interstitial opacities are noted. There is no pleural effusion or  pneumothorax. Degenerative changes are seen in the spine. IMPRESSION: Cardiomegaly and bilateral interstitial opacities may represent congestive heart failure. Aortic Atherosclerosis (ICD10-I70.0). Electronically Signed   By: Zerita Boers M.D.   On: 11/07/2021 19:35   DG CHEST PORT 1 VIEW  Result Date: 11/09/2021 CLINICAL DATA:  Shortness of breath, cough EXAM: PORTABLE CHEST 1 VIEW COMPARISON:  11/07/2021 FINDINGS: Cardiomegaly. No frank interstitial edema. No pleural effusion or pneumothorax. Thoracic aortic atherosclerosis. IMPRESSION: Cardiomegaly. No frank interstitial edema. Electronically Signed   By: Julian Hy M.D.   On: 11/09/2021 02:04   ECHOCARDIOGRAM COMPLETE  Result Date: 11/08/2021    ECHOCARDIOGRAM REPORT   Patient Name:   [name] Date of Exam: 11/08/2021 Medical Rec #:  [id number]             Height:       60.0 in Accession #:    [id number]            Weight:       224.2 lb Date of Birth:  January 15, 1951             BSA:          1.959 m Patient Age:    89 years              BP:           118/61 mmHg Patient Gender: F                     HR:           84 bpm. Exam Location:  Inpatient Procedure: 2D Echo, Cardiac Doppler and Color Doppler Indications:    CHF  History:        Patient has no prior history of Echocardiogram examinations and                 Patient has prior history of Echocardiogram examinations, most                 recent 08/29/2021. CAD, COPD; Risk Factors:Hypertension and                 Diabetes.  Sonographer:    Jefferey Pica Referring Phys: [id number] Clariece Roesler IMPRESSIONS  1. Left ventricular ejection fraction, by estimation, is 55 to 60%. The left ventricle has normal function. The left ventricle has no regional wall motion abnormalities. Left ventricular diastolic parameters are consistent with Grade I diastolic dysfunction (impaired relaxation).  2. Right ventricular systolic function is moderately reduced. The right ventricular size is moderately  enlarged. There is severely elevated pulmonary artery systolic pressure.  3. Right atrial size was moderately dilated.  4. The mitral valve is abnormal. Trivial mitral valve regurgitation. No evidence of mitral stenosis.  5. Tricuspid valve regurgitation is mild to moderate.  6. Gradients stable to slightly lower compared to TTE done 08/29/21. The aortic valve is tricuspid. There is moderate calcification of the aortic valve. There is moderate thickening of the aortic valve. Aortic valve regurgitation is not visualized. Mild aortic valve stenosis.  7. Aortic dilatation noted. There is dilatation of the ascending aorta, measuring 43 mm.  8. The inferior vena cava is dilated in size with >50% respiratory variability, suggesting right atrial pressure of 8 mmHg. FINDINGS  Left Ventricle: Left ventricular ejection fraction, by estimation, is 55 to 60%. The left ventricle has normal function. The left ventricle has no regional wall motion abnormalities. The left ventricular internal cavity size was normal in size. There is  no left ventricular hypertrophy. Left ventricular diastolic parameters are consistent with Grade I diastolic dysfunction (impaired relaxation). Right Ventricle: The right ventricular size is moderately enlarged. No increase in right ventricular wall thickness. Right ventricular systolic function is moderately reduced. There is severely elevated pulmonary artery systolic pressure. The tricuspid regurgitant velocity is 3.37 m/s, and with an assumed right atrial pressure of 15 mmHg, the estimated right ventricular systolic pressure is 14.4 mmHg. Left Atrium: Left atrial size was normal in size. Right Atrium: Right atrial size was moderately dilated. Pericardium: There is no evidence of pericardial  effusion. Mitral Valve: The mitral valve is abnormal. There is mild thickening of the mitral valve leaflet(s). There is mild calcification of the mitral valve leaflet(s). Trivial mitral valve regurgitation. No  evidence of mitral valve stenosis. Tricuspid Valve: The tricuspid valve is normal in structure. Tricuspid valve regurgitation is mild to moderate. No evidence of tricuspid stenosis. Aortic Valve: Gradients stable to slightly lower compared to TTE done 08/29/21. The aortic valve is tricuspid. There is moderate calcification of the aortic valve. There is moderate thickening of the aortic valve. Aortic valve regurgitation is not visualized. Mild aortic stenosis is present. Aortic valve mean gradient measures 12.0 mmHg. Aortic valve peak gradient measures 16.1 mmHg. Aortic valve area, by VTI measures 1.69 cm. Pulmonic Valve: The pulmonic valve was normal in structure. Pulmonic valve regurgitation is mild. No evidence of pulmonic stenosis. Aorta: Aortic dilatation noted. There is dilatation of the ascending aorta, measuring 43 mm. Venous: The inferior vena cava is dilated in size with greater than 50% respiratory variability, suggesting right atrial pressure of 8 mmHg. IAS/Shunts: The interatrial septum was not well visualized.  LEFT VENTRICLE PLAX 2D LVIDd:         4.20 cm   Diastology LVIDs:         3.05 cm   LV e' medial:    5.57 cm/s LV PW:         1.10 cm   LV E/e' medial:  14.1 LV IVS:        0.95 cm   LV e' lateral:   7.72 cm/s LVOT diam:     1.80 cm   LV E/e' lateral: 10.2 LV SV:         79 LV SV Index:   41 LVOT Area:     2.54 cm  RIGHT VENTRICLE             IVC RV S prime:     12.10 cm/s  IVC diam: 2.50 cm TAPSE (M-mode): 2.0 cm LEFT ATRIUM             Index        RIGHT ATRIUM           Index LA diam:        3.40 cm 1.74 cm/m   RA Area:     12.90 cm LA Vol (A2C):   39.4 ml 20.11 ml/m  RA Volume:   31.90 ml  16.28 ml/m LA Vol (A4C):   44.5 ml 22.71 ml/m LA Biplane Vol: 42.2 ml 21.54 ml/m  AORTIC VALVE                     PULMONIC VALVE AV Area (Vmax):    1.98 cm      PV Vmax:       1.17 m/s AV Area (Vmean):   1.76 cm      PV Peak grad:  5.5 mmHg AV Area (VTI):     1.69 cm AV Vmax:           200.68 cm/s  AV Vmean:          159.000 cm/s AV VTI:            0.470 m AV Peak Grad:      16.1 mmHg AV Mean Grad:      12.0 mmHg LVOT Vmax:         156.00 cm/s LVOT Vmean:        110.000 cm/s LVOT VTI:  0.312 m LVOT/AV VTI ratio: 0.66  AORTA Ao Root diam: 3.25 cm Ao Asc diam:  4.30 cm MITRAL VALVE                TRICUSPID VALVE MV Area (PHT): 3.37 cm     TR Peak grad:   45.4 mmHg MV Decel Time: 225 msec     TR Vmax:        337.00 cm/s MV E velocity: 78.60 cm/s MV A velocity: 138.00 cm/s  SHUNTS MV E/A ratio:  0.57         Systemic VTI:  0.31 m                             Systemic Diam: 1.80 cm Jenkins Rouge MD Electronically signed by Jenkins Rouge MD Signature Date/Time: 11/08/2021/2:23:15 PM    Final       Subjective: Patient seen and examined at the bedside this morning.  Hemodynamically stable today.  On 2 L of oxygen per minute.  Her shortness of breath is improved but she still having some cough.  Medically stable for discharge.  Discussed with daughter on phone  Discharge Exam: Vitals:   11/13/21 0800 11/13/21 1511  BP: 120/69 135/70  Pulse: 69 74  Resp: 17 19  Temp: 98.5 F (36.9 C) 98.5 F (36.9 C)  SpO2:     Vitals:   11/12/21 2200 11/13/21 0428 11/13/21 0800 11/13/21 1511  BP: 128/69 136/84 120/69 135/70  Pulse:  72 69 74  Resp: (!) 21 20 17 19   Temp:  98.3 F (36.8 C) 98.5 F (36.9 C) 98.5 F (36.9 C)  TempSrc:  Oral Oral Oral  SpO2:  100%    Weight:      Height:        General: Pt is alert, awake, not in acute distress Cardiovascular: RRR, S1/S2 +, no rubs, no gallops Respiratory: Mild bilateral wheezing abdominal: Soft, NT, ND, bowel sounds + Extremities: no edema, no cyanosis    The results of significant diagnostics from this hospitalization (including imaging, microbiology, ancillary and laboratory) are listed below for reference.     Microbiology: Recent Results (from the past 240 hour(s))  Resp Panel by RT-PCR (Flu A&B, Covid) Nasopharyngeal Swab     Status:  None   Collection Time: 11/07/21  5:56 PM   Specimen: Nasopharyngeal Swab; Nasopharyngeal(NP) swabs in vial transport medium  Result Value Ref [place] Status   SARS Coronavirus 2 by RT PCR NEGATIVE NEGATIVE Final    Comment: (NOTE) SARS-CoV-2 target nucleic acids are NOT DETECTED.  The SARS-CoV-2 RNA is generally detectable in upper respiratory specimens during the acute phase of infection. The lowest concentration of SARS-CoV-2 viral copies this assay can detect is 138 copies/mL. A negative result does not preclude SARS-Cov-2 infection and should not be used as the sole basis for treatment or other patient management decisions. A negative result may occur with  improper specimen collection/handling, submission of specimen other than nasopharyngeal swab, presence of viral mutation(s) within the areas targeted by this assay, and inadequate number of viral copies(<138 copies/mL). A negative result must be combined with clinical observations, patient history, and epidemiological information. The expected result is Negative.  Fact Sheet for Patients:  EntrepreneurPulse.com.au  Fact Sheet for Healthcare Providers:  IncredibleEmployment.be  This test is no t yet approved or cleared by the [place] FDA and  has been authorized for detection and/or diagnosis of SARS-CoV-2 by FDA under  an Emergency Use Authorization (EUA). This EUA will remain  in effect (meaning this test can be used) for the duration of the COVID-19 declaration under Section 564(b)(1) of the Act, 21 U.S.C.section 360bbb-3(b)(1), unless the authorization is terminated  or revoked sooner.       Influenza A by PCR NEGATIVE NEGATIVE Final   Influenza B by PCR NEGATIVE NEGATIVE Final    Comment: (NOTE) The Xpert Xpress SARS-CoV-2/FLU/RSV plus assay is intended as an aid in the diagnosis of influenza from Nasopharyngeal swab specimens and should not be used as a sole basis for  treatment. Nasal washings and aspirates are unacceptable for Xpert Xpress SARS-CoV-2/FLU/RSV testing.  Fact Sheet for Patients: EntrepreneurPulse.com.au  Fact Sheet for Healthcare Providers: IncredibleEmployment.be  This test is not yet approved or cleared by the [place] FDA and has been authorized for detection and/or diagnosis of SARS-CoV-2 by FDA under an Emergency Use Authorization (EUA). This EUA will remain in effect (meaning this test can be used) for the duration of the COVID-19 declaration under Section 564(b)(1) of the Act, 21 U.S.C. section 360bbb-3(b)(1), unless the authorization is terminated or revoked.  Performed at [facility], [place] [address], [place], [place] [address]      Labs: BNP (last 3 results) Recent Labs    08/23/21 1716 11/07/21 1933 11/08/21 0937  BNP 82.8 84.2 952.8*   Basic Metabolic Panel: Recent Labs  Lab 11/08/21 0937 11/09/21 1218 11/10/21 0219 11/11/21 0127 11/11/21 0555 11/12/21 0407 11/13/21 0133  NA 136   < > 141 139 141 141 139  K 3.7   < > 3.6 4.1 3.6 3.6 3.4*  CL 103   < > 102 98 97* 95* 91*  CO2 26   < > 32 35* 37* 39* 39*  GLUCOSE 112*   < > 107* 109* 99 111* 126*  BUN 11   < > 16 21 19 20  24*  CREATININE 0.69   < > 0.78 0.82 0.77 0.81 0.77  CALCIUM 8.9   < > 8.9 8.9 9.0 9.2 9.3  MG 1.7  --   --   --  1.6*  --   --    < > = values in this interval not displayed.   Liver Function Tests: Recent Labs  Lab 11/07/21 1933 11/08/21 0937  AST 16 13*  ALT 10 8  ALKPHOS 61 53  BILITOT 0.5 0.9  PROT 7.5 6.7  ALBUMIN 3.4* 3.2*   No results for input(s): LIPASE, AMYLASE in the last 168 hours. No results for input(s): AMMONIA in the last 168 hours. CBC: Recent Labs  Lab 11/07/21 1852 11/08/21 0937 11/08/21 2157 11/09/21 1019 11/10/21 0219 11/11/21 0127 11/12/21 0407  WBC 12.2*  --   --   --   --   --   --   HGB 5.2*   < > 7.5* 7.9* 7.7* 8.0* 8.9*  HCT 21.0*   < >  25.9* 29.3* 28.7* 28.9* 32.5*  MCV 78.1*  --   --   --   --   --   --   PLT 444*  --   --   --   --   --   --    < > = values in this interval not displayed.   Cardiac Enzymes: No results for input(s): CKTOTAL, CKMB, CKMBINDEX, TROPONINI in the last 168 hours. BNP: Invalid input(s): POCBNP CBG: Recent Labs  Lab 11/12/21 1116 11/12/21 1632 11/12/21 1951 11/13/21 0729 11/13/21 1141  GLUCAP 129*  145* 229* 109* 122*   D-Dimer No results for input(s): DDIMER in the last 72 hours. Hgb A1c No results for input(s): HGBA1C in the last 72 hours. Lipid Profile No results for input(s): CHOL, HDL, LDLCALC, TRIG, CHOLHDL, LDLDIRECT in the last 72 hours. Thyroid function studies No results for input(s): TSH, T4TOTAL, T3FREE, THYROIDAB in the last 72 hours.  Invalid input(s): FREET3 Anemia work up No results for input(s): VITAMINB12, FOLATE, FERRITIN, TIBC, IRON, RETICCTPCT in the last 72 hours. Urinalysis    Component Value Date/Time   COLORURINE YELLOW 04/12/2020 1351   APPEARANCEUR HAZY (A) 04/12/2020 1351   LABSPEC 1.023 04/12/2020 1351   PHURINE 5.0 04/12/2020 1351   GLUCOSEU NEGATIVE 04/12/2020 1351   HGBUR NEGATIVE 04/12/2020 1351   BILIRUBINUR NEGATIVE 04/12/2020 1351   KETONESUR 5 (A) 04/12/2020 1351   PROTEINUR NEGATIVE 04/12/2020 1351   UROBILINOGEN 0.2 04/03/2013 1106   NITRITE NEGATIVE 04/12/2020 1351   LEUKOCYTESUR NEGATIVE 04/12/2020 1351   Sepsis Labs Invalid input(s): PROCALCITONIN,  WBC,  LACTICIDVEN Microbiology Recent Results (from the past 240 hour(s))  Resp Panel by RT-PCR (Flu A&B, Covid) Nasopharyngeal Swab     Status: None   Collection Time: 11/07/21  5:56 PM   Specimen: Nasopharyngeal Swab; Nasopharyngeal(NP) swabs in vial transport medium  Result Value Ref [place] Status   SARS Coronavirus 2 by RT PCR NEGATIVE NEGATIVE Final    Comment: (NOTE) SARS-CoV-2 target nucleic acids are NOT DETECTED.  The SARS-CoV-2 RNA is generally detectable in upper  respiratory specimens during the acute phase of infection. The lowest concentration of SARS-CoV-2 viral copies this assay can detect is 138 copies/mL. A negative result does not preclude SARS-Cov-2 infection and should not be used as the sole basis for treatment or other patient management decisions. A negative result may occur with  improper specimen collection/handling, submission of specimen other than nasopharyngeal swab, presence of viral mutation(s) within the areas targeted by this assay, and inadequate number of viral copies(<138 copies/mL). A negative result must be combined with clinical observations, patient history, and epidemiological information. The expected result is Negative.  Fact Sheet for Patients:  EntrepreneurPulse.com.au  Fact Sheet for Healthcare Providers:  IncredibleEmployment.be  This test is no t yet approved or cleared by the [place] FDA and  has been authorized for detection and/or diagnosis of SARS-CoV-2 by FDA under an Emergency Use Authorization (EUA). This EUA will remain  in effect (meaning this test can be used) for the duration of the COVID-19 declaration under Section 564(b)(1) of the Act, 21 U.S.C.section 360bbb-3(b)(1), unless the authorization is terminated  or revoked sooner.       Influenza A by PCR NEGATIVE NEGATIVE Final   Influenza B by PCR NEGATIVE NEGATIVE Final    Comment: (NOTE) The Xpert Xpress SARS-CoV-2/FLU/RSV plus assay is intended as an aid in the diagnosis of influenza from Nasopharyngeal swab specimens and should not be used as a sole basis for treatment. Nasal washings and aspirates are unacceptable for Xpert Xpress SARS-CoV-2/FLU/RSV testing.  Fact Sheet for Patients: EntrepreneurPulse.com.au  Fact Sheet for Healthcare Providers: IncredibleEmployment.be  This test is not yet approved or cleared by the [place] FDA and has been  authorized for detection and/or diagnosis of SARS-CoV-2 by FDA under an Emergency Use Authorization (EUA). This EUA will remain in effect (meaning this test can be used) for the duration of the COVID-19 declaration under Section 564(b)(1) of the Act, 21 U.S.C. section 360bbb-3(b)(1), unless the authorization is terminated or revoked.  Performed at [facility]  [facility], [place] [address] [place], [place] [address]     Please note: You were cared for by a hospitalist during your hospital stay. Once you are discharged, your primary care physician will handle any further medical issues. Please note that NO REFILLS for any discharge medications will be authorized once you are discharged, as it is imperative that you return to your primary care physician (or establish a relationship with a primary care physician if you do not have one) for your post hospital discharge needs so that they can reassess your need for medications and monitor your lab values.    Time coordinating discharge: 40 minutes  SIGNED:   Shelly Coss, MD  [facility] 11/13/2021, 3:53 PM Pager [phone]  If 7PM-7AM, please contact night-coverage [email or web address] Password [email or web address]

## 2021-11-13 NOTE — Consult Note (Signed)
CARDIOLOGY CONSULT NOTE  Patient ID: [name] MRN: [id number] DOB/AGE: 09-10-1950 71 y.o.  Admit date: 11/07/2021 Referring Physician  Shelly Coss, MD Primary Physician:  Benito Mccreedy, MD Reason for Consultation  Chest pain  Patient ID: [name], female    DOB: 1951-03-25, 71 y.o.   MRN: [id number]  Chief Complaint  Patient presents with   Shortness of Breath   HPI:    [name]  is a 71 y.o. African-American female patient with moderate coronary artery disease of 60% in the RCA and LAD by angiography in 2015, primary hypertension, hyperlipidemia, tobacco use disorder who has quit smoking recently, underlying COPD, peripheral arterial disease with right SFA stenting in 2018 with mild disease on the left leg, prior history of GI bleed in 2014 showing AV malformation and colonic polyps admitted to the hospital with shortness of breath, COPD exacerbation and GI bleed with heme positive stool and found to have hemoglobin of 5.2, underwent blood transfusion and GI work-up, last night complained of chest discomfort hence referral.  Chest pain symptoms in the retrosternal area lasted 2 hours, she had no relief with nitroglycerin, her evaluation by hospitalist evaluated, she felt well after she had a belching and GI cocktail was given.  In spite of 2 hours of chest pain, her EKG essentially normal.  Patient states that she has not had any further episodes of chest pain since last night, she slept well.  Denies symptoms of claudication, denies abdominal discomfort or black stool.  Past Medical History:  Diagnosis Date   Anemia 12/26/2015   Anxiety    Arthritis    Blood transfusion without reported diagnosis    Chronic pain    resolved per pt 04/12/20   COPD (chronic obstructive pulmonary disease) (HCC)    Coronary artery disease    Depression    Diabetes mellitus without complication (HCC)    type 2   GERD (gastroesophageal reflux disease)     Heart murmur    since birth; Echo 02/18/19: LVEF 40%, grade 1 diastolic dysfunction, mild AS (mean grad 12 mmHg), trace MR/PR, mild TR, PASP 32 mmHg     Hyperlipemia    Hypertension    PVD (peripheral vascular disease) ([place])    right SFA stent 02/11/17 by Dr. Einar Gip   Sleep apnea    does not use cpap   Wears glasses    Past Surgical History:  Procedure Laterality Date   ABDOMINAL HYSTERECTOMY     CARDIAC CATHETERIZATION     CATARACT EXTRACTION     CESAREAN SECTION     COLONOSCOPY N/A 01/08/2013   Procedure: COLONOSCOPY;  Surgeon: Beryle Beams, MD;  Location: [facility];  Service: Endoscopy;  Laterality: N/A;   COLONOSCOPY N/A 12/28/2015   Procedure: COLONOSCOPY;  Surgeon: Carol Ada, MD;  Location: [place];  Service: Endoscopy;  Laterality: N/A;   COLONOSCOPY WITH PROPOFOL N/A 10/05/2020   Procedure: COLONOSCOPY WITH PROPOFOL;  Surgeon: Carol Ada, MD;  Location: [facility];  Service: Endoscopy;  Laterality: N/A;   ENTEROSCOPY N/A 12/28/2015   Procedure: ENTEROSCOPY;  Surgeon: Carol Ada, MD;  Location: [place];  Service: Endoscopy;  Laterality: N/A;   ENTEROSCOPY N/A 02/20/2018   Procedure: ENTEROSCOPY;  Surgeon: Carol Ada, MD;  [facility];  Service: Endoscopy;  Laterality: N/A;   ENTEROSCOPY N/A 03/27/2018   Procedure: ENTEROSCOPY;  Surgeon: Carol Ada, MD;  [facility];  Service: Endoscopy;  Laterality: N/A;   ENTEROSCOPY N/A 10/05/2020   Procedure: ENTEROSCOPY;  Surgeon: Carol Ada, MD;  Location: [facility];  Service: Endoscopy;  Laterality: N/A;   ENTEROSCOPY N/A 05/11/2021   Procedure: ENTEROSCOPY;  Surgeon: Carol Ada, MD;  Location: [facility];  Service: Endoscopy;  Laterality: N/A;   ENTEROSCOPY N/A 09/23/2021   Procedure: ENTEROSCOPY;  Surgeon: Doran Stabler, MD;  Location: [place];  Service: Gastroenterology;  Laterality: N/A;   ESOPHAGOGASTRODUODENOSCOPY N/A 01/08/2013   Procedure: ESOPHAGOGASTRODUODENOSCOPY  (EGD);  Surgeon: Beryle Beams, MD;  Location: [facility];  Service: Endoscopy;  Laterality: N/A;   EYE SURGERY     GIVENS CAPSULE STUDY N/A 09/24/2021   Procedure: GIVENS CAPSULE STUDY;  Surgeon: Carol Ada, MD;  Location: [place];  Service: Gastroenterology;  Laterality: N/A;   HAND SURGERY     HEMOSTASIS CLIP PLACEMENT  05/11/2021   Procedure: HEMOSTASIS CLIP PLACEMENT;  Surgeon: Carol Ada, MD;  [facility];  Service: Endoscopy;;   HOT HEMOSTASIS N/A 12/28/2015   Procedure: HOT HEMOSTASIS (ARGON PLASMA COAGULATION/BICAP);  Surgeon: Carol Ada, MD;  Location: [facility] ENDOSCOPY;  Service: Endoscopy;  Laterality: N/A;   HOT HEMOSTASIS N/A 02/20/2018   Procedure: HOT HEMOSTASIS (ARGON PLASMA COAGULATION/BICAP);  Surgeon: Carol Ada, MD;  Location: [facility];  Service: Endoscopy;  Laterality: N/A;   HOT HEMOSTASIS N/A 03/27/2018   Procedure: HOT HEMOSTASIS (ARGON PLASMA COAGULATION/BICAP);  Surgeon: Carol Ada, MD;  Location: [facility];  Service: Endoscopy;  Laterality: N/A;   HOT HEMOSTASIS N/A 10/05/2020   Procedure: HOT HEMOSTASIS (ARGON PLASMA COAGULATION/BICAP);  Surgeon: Carol Ada, MD;  Location: [facility];  Service: Endoscopy;  Laterality: N/A;   HOT HEMOSTASIS N/A 05/11/2021   Procedure: HOT HEMOSTASIS (ARGON PLASMA COAGULATION/BICAP);  Surgeon: Carol Ada, MD;  Location: [facility];  Service: Endoscopy;  Laterality: N/A;   LEFT HEART CATHETERIZATION WITH CORONARY ANGIOGRAM N/A 03/09/2013   Procedure: LEFT HEART CATHETERIZATION WITH CORONARY ANGIOGRAM;  Surgeon: Laverda Page, MD;  Location: [facility] CATH LAB;  Service: Cardiovascular;  Laterality: N/A;   LOWER EXTREMITY ANGIOGRAM N/A 12/29/2012   Procedure: LOWER EXTREMITY ANGIOGRAM;  Surgeon: Laverda Page, MD;  Location: [facility] CATH LAB;  Service: Cardiovascular;  Laterality: N/A;   LOWER EXTREMITY ANGIOGRAM N/A 10/05/2013   Procedure: LOWER EXTREMITY ANGIOGRAM;  Surgeon: Laverda Page, MD;   Location: [facility] C[facility] LAB;  Service: Cardiovascular;  Laterality: N/A;   LOWER EXTREMITY ANGIOGRAPHY N/A 02/11/2017   Procedure: Lower Extremity Angiography;  Surgeon: Adrian Prows, MD;  Location: [place] CV LAB;  Service: Cardiovascular;  Laterality: N/A;   PERIPHERAL VASCULAR BALLOON ANGIOPLASTY  02/11/2017   Procedure: PERIPHERAL VASCULAR BALLOON ANGIOPLASTY;  Surgeon: Adrian Prows, MD;  Location: [place] CV LAB;  Service: Cardiovascular;;  Right SFA   PERIPHERAL VASCULAR INTERVENTION Right 02/11/2017   Procedure: PERIPHERAL VASCULAR INTERVENTION;  Surgeon: Adrian Prows, MD;  Location: [place] CV LAB;  Service: Cardiovascular;  Laterality: Right;  Rt SFA   POLYPECTOMY  10/05/2020   Procedure: POLYPECTOMY;  Surgeon: Carol Ada, MD;  Location: [facility];  Service: Endoscopy;;   stent in right leg  12/29/2012   for blood clot, Dr. Nadyne Coombes   TOTAL KNEE ARTHROPLASTY Left 04/18/2020   TOTAL KNEE ARTHROPLASTY Left 04/18/2020   Procedure: LEFT TOTAL KNEE ARTHROPLASTY;  Surgeon: Meredith Pel, MD;  Location: [place];  Service: Orthopedics;  Laterality: Left;   TYMPANOMASTOIDECTOMY Left 03/08/2014   Procedure: TYMPANOMASTOIDECTOMY LEFT;  Surgeon: Ascencion Dike, MD;  Location: [place];  Service: ENT;  Laterality: Left;   TYMPANOMASTOIDECTOMY  2015  UTERINE FIBROID SURGERY     Social History   Tobacco Use   Smoking status: Former    Packs/day: 0.50    Years: 41.00    Pack years: 20.50    Types: Cigarettes   Smokeless tobacco: Never  Substance Use Topics   Alcohol use: Yes    Comment: occasional    Family History  Problem Relation Age of Onset   Diabetes Mother    Hypertension Mother    Heart disease Mother    Heart disease Father    Hypertension Father    Hypertension Sister    Hypertension Brother    Diabetes Brother    Hypertension Sister    Hypertension Brother    Diabetes Brother    Hypertension Brother    Hypertension Brother    Hypertension Brother      Marital Status: Divorced  ROS  Review of Systems  Cardiovascular:  Positive for dyspnea on exertion. Negative for chest pain and leg swelling.  Respiratory:  Positive for cough and wheezing.   Objective      11/13/2021    8:00 AM 11/13/2021    4:28 AM 11/12/2021   10:00 PM  Vitals with BMI  Systolic 408 144 818  Diastolic 69 84 69  Pulse 69 72     Blood pressure 120/69, pulse 69, temperature 98.5 F (36.9 C), temperature source Oral, resp. rate 17, height 5' (1.524 m), weight 101.7 kg, SpO2 100 %.    Physical Exam Neck:     Vascular: No JVD.  Cardiovascular:     Rate and Rhythm: Normal rate and regular rhythm.     Pulses:          Carotid pulses are  on the right side with bruit and  on the left side with bruit.      Dorsalis pedis pulses are 0 on the right side and 1+ on the left side.       Posterior tibial pulses are 0 on the right side and 0 on the left side.     Heart sounds: Murmur heard.  Harsh midsystolic murmur is present with a grade of 2/6 at the upper right sternal border.    No gallop.  Pulmonary:     Effort: Pulmonary effort is normal.     Breath sounds: Rhonchi (diffuse bilateral) present.  Abdominal:     General: Bowel sounds are normal.     Palpations: Abdomen is soft.  Musculoskeletal:     Right lower leg: No edema.     Left lower leg: No edema.   Laboratory examination:   Recent Labs    11/11/21 0555 11/12/21 0407 11/13/21 0133  NA 141 141 139  K 3.6 3.6 3.4*  CL 97* 95* 91*  CO2 37* 39* 39*  GLUCOSE 99 111* 126*  BUN 19 20 24*  CREATININE 0.77 0.81 0.77  CALCIUM 9.0 9.2 9.3  GFRNONAA >60 >60 >60   estimated creatinine clearance is 69.2 mL/min (by C-G formula based on SCr of 0.77 mg/dL).     Latest Ref Rng & Units 11/13/2021    1:33 AM 11/12/2021    4:07 AM 11/11/2021    5:55 AM  CMP  Glucose 70 - 99 mg/dL 126   111   99    BUN 8 - 23 mg/dL 24   20   19     Creatinine 0.44 - 1.00 mg/dL 0.77   0.81   0.77    Sodium 135 - 145 mmol/L 139  141   141    Potassium 3.5 - 5.1 mmol/L 3.4   3.6   3.6    Chloride 98 - 111 mmol/L 91   95   97    CO2 22 - 32 mmol/L 39   39   37    Calcium 8.9 - 10.3 mg/dL 9.3   9.2   9.0        Latest Ref Rng & Units 11/12/2021    4:07 AM 11/11/2021    1:27 AM 11/10/2021    2:19 AM  CBC  Hemoglobin 12.0 - 15.0 g/dL 8.9   8.0   7.7    Hematocrit 36.0 - 46.0 % 32.5   28.9   28.7     Lipid Panel Recent Labs    03/26/21 1328  CHOL 138  TRIG 78  LDLCALC 69  HDL 54  LDLDIRECT 71    HEMOGLOBIN A1C Lab Results  Component Value Date   HGBA1C 4.9 09/21/2021   MPG 93.93 09/21/2021   TSH Recent Labs    08/26/21 0443  TSH 0.315*   BNP (last 3 results) Recent Labs    08/23/21 1716 11/07/21 1933 11/08/21 0937  BNP 82.8 84.2 217.8*   Cardiac Panel (last 3 results) No results for input(s): CKTOTAL, CKMB, TROPONINIHS, RELINDX in the last 72 hours.  Medications and allergies  No Known Allergies   Current Meds  Medication Sig   acetaminophen (TYLENOL) 500 MG tablet Take 1,000 mg by mouth daily as needed for moderate pain.   albuterol (PROVENTIL HFA;VENTOLIN HFA) 108 (90 Base) MCG/ACT inhaler Inhale 1-2 puffs into the lungs every 6 (six) hours as needed for wheezing or shortness of breath.   albuterol (PROVENTIL) (2.5 MG/3ML) 0.083% nebulizer solution Take 2.5 mg by nebulization every 6 (six) hours as needed for wheezing or shortness of breath.   amLODipine (NORVASC) 10 MG tablet Take 1 tablet (10 mg total) by mouth daily.   ASPIRIN LOW DOSE 81 MG EC tablet TAKE 1 TABLET(81 MG) BY MOUTH DAILY (Patient taking differently: Take 81 mg by mouth daily.)   atorvastatin (LIPITOR) 80 MG tablet Take 80 mg by mouth daily.   cilostazol (PLETAL) 100 MG tablet Take 1 tablet (100 mg total) by mouth 2 (two) times daily.   ezetimibe (ZETIA) 10 MG tablet TAKE 1 TABLET BY MOUTH DAILY (Patient taking differently: Take 10 mg by mouth daily.)   ferrous sulfate 325 (65 FE) MG tablet Take 325 mg by mouth daily.    fluticasone (FLONASE) 50 MCG/ACT nasal spray Place 1 spray into both nostrils daily as needed for allergies.   gabapentin (NEURONTIN) 600 MG tablet Take 1 tablet (600 mg total) by mouth at bedtime.   meloxicam (MOBIC) 7.5 MG tablet Take 7.5 mg by mouth daily.   metFORMIN (GLUCOPHAGE) 500 MG tablet Take 0.5 tablets (250 mg total) by mouth 2 (two) times daily with a meal.   MYRBETRIQ 25 MG TB24 tablet Take 25 mg by mouth daily.   pantoprazole (PROTONIX) 40 MG tablet Take 1 tablet (40 mg total) by mouth 2 (two) times daily.   spironolactone (ALDACTONE) 50 MG tablet TAKE 1 TABLET(50 MG) BY MOUTH DAILY (Patient taking differently: Take 50 mg by mouth daily.)    Scheduled Meds:  atorvastatin  80 mg Oral Daily   doxycycline  100 mg Oral Q12H   ezetimibe  10 mg Oral Daily   furosemide  40 mg Oral BID   gabapentin  600 mg Oral BID   guaiFENesin  1,200 mg Oral BID   guaiFENesin-dextromethorphan  10 mL Oral Q6H   insulin aspart  0-9 Units Subcutaneous TID WC   mirabegron ER  25 mg Oral Daily    morphine injection  1 mg Intravenous Once   pantoprazole  40 mg Oral Daily   predniSONE  40 mg Oral Q breakfast   Continuous Infusions:  sodium chloride     magnesium sulfate bolus IVPB Stopped (11/11/21 1141)   PRN Meds:.acetaminophen **OR** acetaminophen, alum & mag hydroxide-simeth **AND** lidocaine, fluticasone, hydrALAZINE, ipratropium-albuterol, nitroGLYCERIN, ondansetron **OR** ondansetron (ZOFRAN) IV, polyethylene glycol   I/O last 3 completed shifts: In: 750 [P.O.:750] Out: 2950 [Urine:2950] Total I/O In: 22 [P.O.:480] Out: -     Radiology:   Chest x-ray 11/09/2021: Cardiomegaly. No frank interstitial edema. No pleural effusion or pneumothorax. Thoracic aortic atherosclerosis.  Cardiac Studies:  Heart Catheterization: Coronary angiogram 02/27/2014: Mid RCA 60-70% stenosis, mid LAD 60-70% stenosis. FFR to the LAD, lesion insignificant. Medical therapy for CAD. Moderate aortic valve  calcification.  ABI  08/17/2021 Right: Resting right ankle-brachial index is within normal range. No evidence of significant right lower extremity arterial disease. The right toe-brachial index is normal.   Left: Resting left ankle-brachial index is within normal range. No evidence of significant left lower extremity arterial disease. The left toe-brachial index is abnormal.   Lower extremity arterial duplex: 08/17/2021 Right: 50-74% stenosis noted in the deep femoral artery. Patent stent with no evidence of stenosis in the superficial femoral artery  EKG:  Normal sinus rhythm without ischemia  Assessment   1.  Chest pain most probably suggestive of GERD, do not suspect unstable angina. 2.  Moderate coronary artery disease 3.  Peripheral arterial disease with stable symptoms of claudication, patient was on aspirin 81 mg and cilostazol 50 mg twice daily. 4.  GI bleed, plans on being referred to [facility] for enteroscopy.  Hemoglobin has remained stable.  Recommendations:   [name] is a 71 y.o. African-American female patient with moderate coronary artery disease of 60% in the RCA and LAD by angiography in 2015, primary hypertension, hyperlipidemia, tobacco use disorder who has quit smoking recently, underlying COPD, peripheral arterial disease with right SFA stenting in 2018 with mild disease on the left leg, admitted to the hospital with shortness of breath, COPD exacerbation and GI bleed with heme positive stool and found to have hemoglobin of 5.2, underwent blood transfusion and GI work-up, last night complained of chest discomfort hence referral.  Chest pain symptoms in the retrosternal area lasted 2 hours, she had no relief with nitroglycerin, her evaluation by hospitalist evaluated, she felt well after she had a belching and GI cocktail was given.  In spite of 2 hours of chest pain, her EKG essentially normal.  Hence do not suspect ACS or progression of coronary artery  disease.  For now would recommend continuing aspirin alone 81 mg daily and discontinue Pletal.  With regard to hypertension, all medications have been on hold due to low blood pressure.  At home she has had resistant blood pressure, probably related to poor diet.  Would recommend discontinuing hydralazine, resuming losartan at 50 mg dose instead of 100 mg dose, continue spironolactone and for fluid overload state probably related to blood transfusion and also acute on chronic diastolic heart failure, continue Lasix 40 mg daily until seen by [place].  I have made an appointment for her to follow-up with [place].  With regard to peripheral artery disease, symptoms of claudication has remained stable.  She still has shortness of breath related to underlying COPD, she has coarse crackles and rhonchi bilaterally.  Her dyspnea is predominantly related to COPD than heart failure.  Thank for the consultation.  I have discussed personally with primary team regarding course of action.   Adrian Prows, MD, [facility] 11/13/2021, 1:58 PM Office: [phone]

## 2021-11-14 ENCOUNTER — Other Ambulatory Visit: Payer: Self-pay | Admitting: *Deleted

## 2021-11-14 NOTE — Patient Outreach (Signed)
[facility]) Care Management Telephonic RN Care Manager Note   11/14/2021 Name:  [name] MRN:  [id number] DOB:  [birth year]  Summary: Discharged from hospital after being admitted for anemia/GI bleed 5/17-5/23.  Denies any urgent concerns, encouraged to contact this care manager with questions.  Agrees to follow up within the next month.   Recommendations/Changes made from today's visit: Call  Pulmonary to schedule follow up.   If no call from [facility], call local GI to follow up on referral.   Have daughter review medications comparing to discharge instruction.  Subjective: [name] is an 71 y.o. year old female who is a primary patient of Osei-Bonsu, Iona Beard, MD. The care management team was consulted for assistance with care management and/or care coordination needs.    Telephonic RN Care Manager completed Telephone Visit today.  Objective:   Medications Reviewed Today     Reviewed by Cicero Duck, CPhT (Pharmacy Technician) on 11/08/21 at [place] List Status: Complete   Medication Order Taking? Sig Documenting Provider Last Dose Status Informant  acetaminophen (TYLENOL) 500 MG tablet [id number] Yes Take 1,000 mg by mouth daily as needed for moderate pain. [provider] Past Week Active Self, Family Member  albuterol (PROVENTIL HFA;VENTOLIN HFA) 108 (90 Base) MCG/ACT inhaler [id number] Yes Inhale 1-2 puffs into the lungs every 6 (six) hours as needed for wheezing or shortness of breath. [provider] 11/07/2021 Active Self, Family Member, Pharmacy Records  albuterol (PROVENTIL) (2.5 MG/3ML) 0.083% nebulizer solution [id number] Yes Take 2.5 mg by nebulization every 6 (six) hours as needed for wheezing or shortness of breath. [provider] 11/07/2021 Active Self, Family Member, Pharmacy Records  amLODipine (NORVASC) 10 MG tablet [id number] Yes Take 1 tablet (10 mg total) by mouth daily. Thurnell Lose, MD 11/07/2021 Active Self, Family Member, Pharmacy Records  ASPIRIN LOW DOSE 81 MG EC tablet [id number] Yes TAKE 1 TABLET(81 MG) BY MOUTH DAILY  Patient taking differently: Take 81 mg by mouth daily.   Rosalin Hawking 11/07/2021 Active Self, Family Member  atorvastatin (LIPITOR) 80 MG tablet [id number] Yes Take 80 mg by mouth daily. [provider] 11/07/2021 Active Self, Family Member, Pharmacy Records  cilostazol (PLETAL) 100 MG tablet [id number] Yes Take 1 tablet (100 mg total) by mouth 2 (two) times daily. Rex Kras, DO 11/07/2021 Active Self, Family Member, Pharmacy Records  ezetimibe (ZETIA) 10 MG tablet [id number] Yes TAKE 1 TABLET BY MOUTH DAILY  Patient taking differently: Take 10 mg by mouth daily.   Rex Kras, DO 11/07/2021 Active Self, Family Member, Pharmacy Records  ferrous sulfate 325 (65 FE) MG tablet [id number] Yes Take 325 mg by mouth daily. [provider] 11/07/2021 Active Self, Family Member  fluticasone (FLONASE) 50 MCG/ACT nasal spray [id number] Yes Place 1 spray into both nostrils daily as needed for allergies. [provider] 11/07/2021 Active Self, Family Member, Pharmacy Records  gabapentin (NEURONTIN) 600 MG tablet [id number] Yes Take 1 tablet (600 mg total) by mouth at bedtime. Hyatt, Max T, DPM Past Week Active Self, Family Member, Pharmacy Records  hydrALAZINE (APRESOLINE) 100 MG tablet [id number] No Take 100 mg by mouth 3 (three) times daily.  Patient not taking: Reported on 11/08/2021   [provider] Not Taking Active Self, Family Member, Pharmacy Records           Med Note (COFFELL, Cyril Loosen Nov 08, 2021  8:32 AM) Pt's grand daughter states pt is no  longer taking this medication. LF 07/31/21 #270, 90 DS  ipratropium-albuterol (DUONEB) 0.5-2.5 (3) MG/3ML SOLN [id number] No Take 3 mLs by nebulization 3 (three) times daily.  Patient not taking: Reported on 11/08/2021   Donne Hazel, MD Not Taking Active Self, Family  Member, Pharmacy Records  losartan (COZAAR) 100 MG tablet [id number] No Take 100 mg by mouth daily.  Patient not taking: Reported on 11/08/2021   [provider] Not Taking Active Self, Family Member, Pharmacy Records           Med Note (COFFELL, Cyril Loosen Nov 08, 2021  8:31 AM) Pt's grand daughter states pt is no longer taking this medication. LF 09/10/21 #90, 90 DS.  meloxicam (MOBIC) 7.5 MG tablet [id number] Yes Take 7.5 mg by mouth daily. [provider] 11/07/2021 Active Self, Family Member, Pharmacy Records  metFORMIN (GLUCOPHAGE) 500 MG tablet [id number] Yes Take 0.5 tablets (250 mg total) by mouth 2 (two) times daily with a meal. Adrian Prows, MD 11/07/2021 Active Self, Family Member, Pharmacy Records  methocarbamol (ROBAXIN) 500 MG tablet [id number] No Take 1 tablet (500 mg total) by mouth every 8 (eight) hours as needed for muscle spasms.  Patient not taking: Reported on 11/08/2021   Donella Stade, PA-C Not Taking Active Self, Family Member, Pharmacy Records  metoprolol tartrate (LOPRESSOR) 50 MG tablet [id number] No Take 50 mg by mouth 2 (two) times daily.  Patient not taking: Reported on 11/08/2021   [provider] Not Taking Active Self, Family Member, Pharmacy Records           Med Note (COFFELL, Dionne Bucy   Thu Nov 08, 2021  8:25 AM) Pt states she is out of medication, that her bottle is empty on her nightstand. She was told by pharmacy to contact her MD for refill, has not had a response. LF 09/25/2021 #180, 90 DS. By the dates she should still have a supply, but, pt insists she does not.   MYRBETRIQ 25 MG TB24 tablet [id number] Yes Take 25 mg by mouth daily. [provider] 11/07/2021 Active Self, Family Member, Pharmacy Records           Med Note (COFFELL, Dionne Bucy   Thu Nov 08, 2021  8:25 AM)    nitroGLYCERIN (NITROSTAT) 0.4 MG SL tablet [id number] No Place 1 tablet (0.4 mg total) under the tongue every 5 (five) minutes as needed for chest pain.   Patient not taking: Reported on 11/08/2021   Miquel Dunn, NP Not Taking Active Self, Family Member, Pharmacy Records           Med Note (COFFELL, Dionne Bucy   Thu Nov 08, 2021  8:23 AM) Pt was told to throw her old supply away as they were expired. Pt needs new RX for this medication.  OXYGEN [id number]  Inhale 2 L into the lungs continuous. [provider]  Active Self, Family Member  pantoprazole (PROTONIX) 40 MG tablet [id number] Yes Take 1 tablet (40 mg total) by mouth 2 (two) times daily. Thurnell Lose, MD 11/07/2021 Active Self, Family Member, Pharmacy Records  spironolactone (ALDACTONE) 50 MG tablet [id number] Yes TAKE 1 TABLET(50 MG) BY MOUTH DAILY  Patient taking differently: Take 50 mg by mouth daily.   Rex Kras, DO 11/07/2021 Active Self, Family Member, Pharmacy Records             SDOH:  (Social Determinants of Health) assessments and interventions performed:     Care Plan  Review  of patient past medical history, allergies, medications, health status, including review of consultants reports, laboratory and other test data, was performed as part of comprehensive evaluation for care management services.   Care Plan : RN Care Manager Plan of Care  Updates made by Valente David, RN since 11/14/2021 12:00 AM     Problem: Knowledg Deficit related to COPD management and care coordination needs.   Priority: High     Long-Range Goal: Development of plan of care for manament of COPD   Start Date: 08/31/2021  Expected End Date: 02/21/2022  This Visit's Progress: On track  Recent Progress: On track  Priority: High  Note:   Current Barriers:  Knowledge Deficits related to plan of care for management of COPD   RNCM Clinical Goal(s):  Patient will verbalize understanding of plan for management of COPD as evidenced by self report and chart review  through collaboration with RN Care manager, provider, and care team.   Interventions: Inter-disciplinary care  team collaboration (see longitudinal plan of care) Evaluation of current treatment plan related to  self management and patient's adherence to plan as established by provider   COPD Interventions:  (Status:  Goal on track:  Yes.) Long Term Goal Provided patient with basic written and verbal COPD education on self care/management/and exacerbation prevention Advised patient to track and manage COPD triggers Provided written and verbal instructions on pursed lip breathing and utilized returned demonstration as teach back Provided instruction about proper use of medications used for management of COPD including inhalers Advised patient to self assesses COPD action plan zone and make appointment with provider if in the yellow zone for 48 hours without improvement Provided education about and advised patient to utilize infection prevention strategies to reduce risk of respiratory infection Discussed the importance of adequate rest and management of fatigue with COPD Screening for signs and symptoms of depression related to chronic disease state  Assessed social determinant of health barriers  TOC week #3-3/15: Pt continues to do well however "alittle under the weather" today but remains in the GREEN zone with her ongoing COPD and management of care. Continue to have support from her daughter. No acute issues or related symptoms with exacerbation related to her COPD. Pt remains aware to follow up with provider with acute needs in the YELLOW zone. Will follow up next week with ongoing transition of care call next week. TOC week #4-3/28: Readmit on 3/22 "coughing up blood". Pt denies any additional symptoms however continue to utilize her ongoing COPD treatment and home O2 with some sputum that is clear, no additional bleeding. Pt feeling better and verifies she is taking all her prescribed medications with no acute issues and sufficient transportation to all medical appointments. Spoke with pt today and who  inquired on received DME for a pure-wick and hospital bed. This has been discussed in the pass and pt  has spoken with her provider and started this process however DME agency having issues with getting pt's insurance to cover this request. Pt aware to call her insurance customer services for assist with DME coverage. Pt called [facility] hotline today assuming this is something that we can approved. Pt educated on services once again and the use of the [facility] RN hotline, daughter also updated as they were not aware. No other issues reported. RN reiterated on the plan of care and answer all inquires and questions. Strongly encourage adherence with pt's following the plan of care. Will follow up next week with any acute needs  and continue to address issues accordingly.  TOC last hospital 3/31-4/4 for Acute upper GI bleed: RN spoke with pt today and received an update. Pt continues to recover well with no signs of bleeding however pt reports unable to find the source of the bleed. Pt states her breathing is much improved with her ongoing home O2 2 liters. Pt remains in the GREEN zone with no symptoms at this time. Pt remains aware on who to call with any symptoms of bleed and currently pending an appointment with GI on 4/12 to follow up on her bleeding. Denies any dizziness or related symptoms of lost of blood and aware to use caution with all activities until the source is found. Pt continue to have a supportive daughter to assist with any acute issues.  10/05/2021 Update: Pt continues to do well with no acute issues or encounters. Pt continue to remains in the GREEN zone with her COPD. Denies any symptoms of coughing up blood as experienced last month. Currently awaiting pharmacy to receive orders for her ongoing nebulizer medications. Primary care visit end of April. Reports CBG for  her diabetes remain stable with baseline readings of 135 this AM fasting. Denies any current issues or needs at this time. Will review the  current plan of care and intervention accordingly. Pt prefers monthly follow up call moving forward and feel she does not need a weekly contact.  5/16 Update:Pt reports no issues related to her breathing however issues with being very "fatigue" not related to her breathing but very tired and weak. Pt has a follow up appointment with her provider and will address this issues and change. Pt verified she is in the GREEN zone with no acute needs or issues to address at this time. Verified adherence to all her medications and sufficient transportation to get to all her medical appointments. No other issues or needs presented as pt is managing her COPD with no reported issues.   11/14/2021 Update (TOC program restarted post discharge) - Hospitalized for GI bleed requiring transfusion of PRBC.  Denies any further bleeding since discharge home.  Local GI placed referral to [facility] specialist for further testing, she is waiting on call to schedule follow up.  Will see cardiology on 6/6, state she had "fluid on my lungs" while in hospital, started on Lasix. Denies any swelling or additional shortness of breath other than COPD.  Recommended to see pulmonary, advised of where to find contact information for office, state she will call to schedule.  Follow up with PCP scheduled for 6/17.  She has received call from [facility], will make first home visit tomorrow.     Patient Goals/Self-Care Activities: Take all medications as prescribed Attend all scheduled provider appointments Call pharmacy for medication refills 3-7 days in advance of running out of medications Attend church or other social activities Perform all self care activities independently  Perform IADL's (shopping, preparing meals, housekeeping, managing finances) independently Call provider office for new concerns or questions  eliminate smoking in my home identify and avoid work-related triggers identify and remove indoor air pollutants limit outdoor  activity during cold weather develop a rescue plan eliminate symptom triggers at home follow rescue plan if symptoms flare-up keep follow-up appointments: Reminder to contact provider on Monday to schedule an appointment for post-op hospitalization. don't eat or exercise right before bedtime use devices that will help like a cane, sock-puller or reacher do breathing exercises every day  Follow Up Plan:  Telephone follow up appointment  with care management team member scheduled for:  in 1 week The patient has been provided with contact information for the care management team and has been advised to call with any health related questions or concerns.        Plan:  Telephone follow up appointment with care management team member scheduled for:  1 week The patient has been provided with contact information for the care management team and has been advised to call with any health related questions or concerns.   Valente David, RN, MSN, [place] Manager [phone]

## 2021-11-16 DIAGNOSIS — E1151 Type 2 diabetes mellitus with diabetic peripheral angiopathy without gangrene: Secondary | ICD-10-CM | POA: Diagnosis not present

## 2021-11-16 DIAGNOSIS — K219 Gastro-esophageal reflux disease without esophagitis: Secondary | ICD-10-CM | POA: Diagnosis not present

## 2021-11-16 DIAGNOSIS — J440 Chronic obstructive pulmonary disease with acute lower respiratory infection: Secondary | ICD-10-CM | POA: Diagnosis not present

## 2021-11-16 DIAGNOSIS — I2510 Atherosclerotic heart disease of native coronary artery without angina pectoris: Secondary | ICD-10-CM | POA: Diagnosis not present

## 2021-11-16 DIAGNOSIS — J208 Acute bronchitis due to other specified organisms: Secondary | ICD-10-CM | POA: Diagnosis not present

## 2021-11-16 DIAGNOSIS — E1169 Type 2 diabetes mellitus with other specified complication: Secondary | ICD-10-CM | POA: Diagnosis not present

## 2021-11-16 DIAGNOSIS — D500 Iron deficiency anemia secondary to blood loss (chronic): Secondary | ICD-10-CM | POA: Diagnosis not present

## 2021-11-16 DIAGNOSIS — E782 Mixed hyperlipidemia: Secondary | ICD-10-CM | POA: Diagnosis not present

## 2021-11-16 DIAGNOSIS — B9689 Other specified bacterial agents as the cause of diseases classified elsewhere: Secondary | ICD-10-CM | POA: Diagnosis not present

## 2021-11-16 DIAGNOSIS — Z9981 Dependence on supplemental oxygen: Secondary | ICD-10-CM | POA: Diagnosis not present

## 2021-11-16 DIAGNOSIS — D75839 Thrombocytosis, unspecified: Secondary | ICD-10-CM | POA: Diagnosis not present

## 2021-11-16 DIAGNOSIS — I110 Hypertensive heart disease with heart failure: Secondary | ICD-10-CM | POA: Diagnosis not present

## 2021-11-16 DIAGNOSIS — I5033 Acute on chronic diastolic (congestive) heart failure: Secondary | ICD-10-CM | POA: Diagnosis not present

## 2021-11-16 DIAGNOSIS — K921 Melena: Secondary | ICD-10-CM | POA: Diagnosis not present

## 2021-11-16 DIAGNOSIS — J441 Chronic obstructive pulmonary disease with (acute) exacerbation: Secondary | ICD-10-CM | POA: Diagnosis not present

## 2021-11-16 DIAGNOSIS — F32A Depression, unspecified: Secondary | ICD-10-CM | POA: Diagnosis not present

## 2021-11-16 DIAGNOSIS — K922 Gastrointestinal hemorrhage, unspecified: Secondary | ICD-10-CM | POA: Diagnosis not present

## 2021-11-16 DIAGNOSIS — G4733 Obstructive sleep apnea (adult) (pediatric): Secondary | ICD-10-CM | POA: Diagnosis not present

## 2021-11-16 DIAGNOSIS — F17200 Nicotine dependence, unspecified, uncomplicated: Secondary | ICD-10-CM | POA: Diagnosis not present

## 2021-11-16 DIAGNOSIS — M1712 Unilateral primary osteoarthritis, left knee: Secondary | ICD-10-CM | POA: Diagnosis not present

## 2021-11-16 DIAGNOSIS — J9621 Acute and chronic respiratory failure with hypoxia: Secondary | ICD-10-CM | POA: Diagnosis not present

## 2021-11-21 ENCOUNTER — Other Ambulatory Visit: Payer: Self-pay | Admitting: *Deleted

## 2021-11-21 DIAGNOSIS — D500 Iron deficiency anemia secondary to blood loss (chronic): Secondary | ICD-10-CM | POA: Diagnosis not present

## 2021-11-21 DIAGNOSIS — J449 Chronic obstructive pulmonary disease, unspecified: Secondary | ICD-10-CM | POA: Diagnosis not present

## 2021-11-21 DIAGNOSIS — G894 Chronic pain syndrome: Secondary | ICD-10-CM | POA: Diagnosis not present

## 2021-11-21 DIAGNOSIS — E1140 Type 2 diabetes mellitus with diabetic neuropathy, unspecified: Secondary | ICD-10-CM | POA: Diagnosis not present

## 2021-11-21 DIAGNOSIS — F17200 Nicotine dependence, unspecified, uncomplicated: Secondary | ICD-10-CM | POA: Diagnosis not present

## 2021-11-21 DIAGNOSIS — I10 Essential (primary) hypertension: Secondary | ICD-10-CM | POA: Diagnosis not present

## 2021-11-21 DIAGNOSIS — E7800 Pure hypercholesterolemia, unspecified: Secondary | ICD-10-CM | POA: Diagnosis not present

## 2021-11-21 NOTE — Patient Outreach (Signed)
[name]) Care Management Telephonic RN Care Manager Note   11/21/2021 Name:  [name] MRN:  [id number] DOB:  Nov 17, 1950  Summary: Transition of care call completed as pt continues to visit her providers as scheduled. COPD GREEN zone with no related symptoms however pt continues to have some dizziness and cramps (history anemia). Will report to her provider on pending appointment today. Currently awaiting a call back from her [facility] provider.  Recommendations/Changes made from today's visit: Will continue to review the current plan of care and encouraged adherence along with pending follow ups calls and appointments. Will continue to encouraged adherence with all discussed today and inquired on ongoing follow up calls. Pt prefers monthly calls instead of weekly transition of care due to her schedule.  Subjective: [name] is an 71 y.o. year old female who is a primary patient of Osei-Bonsu, Iona Beard, MD. The care management team was consulted for assistance with care management and/or care coordination needs.    Telephonic RN Care Manager completed Telephone Visit today.  Objective:   Medications Reviewed Today     Reviewed by Cicero Duck, CPhT (Pharmacy Technician) on 11/08/21 at [place] List Status: Complete   Medication Order Taking? Sig Documenting Provider Last Dose Status Informant  acetaminophen (TYLENOL) 500 MG tablet [id number] Yes Take 1,000 mg by mouth daily as needed for moderate pain. [provider] Past Week Active Self, Family Member  albuterol (PROVENTIL HFA;VENTOLIN HFA) 108 (90 Base) MCG/ACT inhaler [id number] Yes Inhale 1-2 puffs into the lungs every 6 (six) hours as needed for wheezing or shortness of breath. [provider] 11/07/2021 Active Self, Family Member, Pharmacy Records  albuterol (PROVENTIL) (2.5 MG/3ML) 0.083% nebulizer solution [id number] Yes Take 2.5 mg by nebulization every 6  (six) hours as needed for wheezing or shortness of breath. [provider] 11/07/2021 Active Self, Family Member, Pharmacy Records  amLODipine (NORVASC) 10 MG tablet [id number] Yes Take 1 tablet (10 mg total) by mouth daily. Thurnell Lose, MD 11/07/2021 Active Self, Family Member, Pharmacy Records  ASPIRIN LOW DOSE 81 MG EC tablet [id number] Yes TAKE 1 TABLET(81 MG) BY MOUTH DAILY  Patient taking differently: Take 81 mg by mouth daily.   Rosalin Hawking 11/07/2021 Active Self, Family Member  atorvastatin (LIPITOR) 80 MG tablet [id number] Yes Take 80 mg by mouth daily. [provider] 11/07/2021 Active Self, Family Member, Pharmacy Records  cilostazol (PLETAL) 100 MG tablet [id number] Yes Take 1 tablet (100 mg total) by mouth 2 (two) times daily. Rex Kras, DO 11/07/2021 Active Self, Family Member, Pharmacy Records  ezetimibe (ZETIA) 10 MG tablet [id number] Yes TAKE 1 TABLET BY MOUTH DAILY  Patient taking differently: Take 10 mg by mouth daily.   Rex Kras, DO 11/07/2021 Active Self, Family Member, Pharmacy Records  ferrous sulfate 325 (65 FE) MG tablet [id number] Yes Take 325 mg by mouth daily. [provider] 11/07/2021 Active Self, Family Member  fluticasone (FLONASE) 50 MCG/ACT nasal spray [id number] Yes Place 1 spray into both nostrils daily as needed for allergies. [provider] 11/07/2021 Active Self, Family Member, Pharmacy Records  gabapentin (NEURONTIN) 600 MG tablet [id number] Yes Take 1 tablet (600 mg total) by mouth at bedtime. Hyatt, Max T, DPM Past Week Active Self, Family Member, Pharmacy Records  hydrALAZINE (APRESOLINE) 100 MG tablet [id number] No Take 100 mg by mouth 3 (three) times daily.  Patient not taking: Reported on 11/08/2021   [provider] Not Taking Active Self,  Family Member, Pharmacy Records           Med Note (COFFELL, Dionne Bucy   Thu Nov 08, 2021  8:32 AM) Pt's grand daughter states pt is no longer taking this  medication. LF 07/31/21 #270, 90 DS  ipratropium-albuterol (DUONEB) 0.5-2.5 (3) MG/3ML SOLN [id number] No Take 3 mLs by nebulization 3 (three) times daily.  Patient not taking: Reported on 11/08/2021   Donne Hazel, MD Not Taking Active Self, Family Member, Pharmacy Records  losartan (COZAAR) 100 MG tablet [id number] No Take 100 mg by mouth daily.  Patient not taking: Reported on 11/08/2021   [provider] Not Taking Active Self, Family Member, Pharmacy Records           Med Note (COFFELL, Cyril Loosen Nov 08, 2021  8:31 AM) Pt's grand daughter states pt is no longer taking this medication. LF 09/10/21 #90, 90 DS.  meloxicam (MOBIC) 7.5 MG tablet [id number] Yes Take 7.5 mg by mouth daily. [provider] 11/07/2021 Active Self, Family Member, Pharmacy Records  metFORMIN (GLUCOPHAGE) 500 MG tablet [id number] Yes Take 0.5 tablets (250 mg total) by mouth 2 (two) times daily with a meal. Adrian Prows, MD 11/07/2021 Active Self, Family Member, Pharmacy Records  methocarbamol (ROBAXIN) 500 MG tablet [id number] No Take 1 tablet (500 mg total) by mouth every 8 (eight) hours as needed for muscle spasms.  Patient not taking: Reported on 11/08/2021   Donella Stade, PA-C Not Taking Active Self, Family Member, Pharmacy Records  metoprolol tartrate (LOPRESSOR) 50 MG tablet [id number] No Take 50 mg by mouth 2 (two) times daily.  Patient not taking: Reported on 11/08/2021   [provider] Not Taking Active Self, Family Member, Pharmacy Records           Med Note (COFFELL, Dionne Bucy   Thu Nov 08, 2021  8:25 AM) Pt states she is out of medication, that her bottle is empty on her nightstand. She was told by pharmacy to contact her MD for refill, has not had a response. LF 09/25/2021 #180, 90 DS. By the dates she should still have a supply, but, pt insists she does not.   MYRBETRIQ 25 MG TB24 tablet [id number] Yes Take 25 mg by mouth daily. [provider] 11/07/2021 Active Self, Family  Member, Pharmacy Records           Med Note (COFFELL, Dionne Bucy   Thu Nov 08, 2021  8:25 AM)    nitroGLYCERIN (NITROSTAT) 0.4 MG SL tablet [id number] No Place 1 tablet (0.4 mg total) under the tongue every 5 (five) minutes as needed for chest pain.  Patient not taking: Reported on 11/08/2021   Miquel Dunn, NP Not Taking Active Self, Family Member, Pharmacy Records           Med Note (COFFELL, Dionne Bucy   Thu Nov 08, 2021  8:23 AM) Pt was told to throw her old supply away as they were expired. Pt needs new RX for this medication.  OXYGEN [id number]  Inhale 2 L into the lungs continuous. [provider]  Active Self, Family Member  pantoprazole (PROTONIX) 40 MG tablet [id number] Yes Take 1 tablet (40 mg total) by mouth 2 (two) times daily. Thurnell Lose, MD 11/07/2021 Active Self, Family Member, Pharmacy Records  spironolactone (ALDACTONE) 50 MG tablet [id number] Yes TAKE 1 TABLET(50 MG) BY MOUTH DAILY  Patient taking differently: Take 50 mg by mouth daily.   Rex Kras, DO 11/07/2021 Active  Self, Family Member, Pharmacy Records             SDOH:  (Social Determinants of Health) assessments and interventions performed:     Care Plan  Review of patient past medical history, allergies, medications, health status, including review of consultants reports, laboratory and other test data, was performed as part of comprehensive evaluation for care management services.   Care Plan : RN Care Manager Plan of Care  Updates made by Tobi Bastos, RN since 11/21/2021 12:00 AM     Problem: Knowledg Deficit related to COPD management and care coordination needs.   Priority: High     Long-Range Goal: Development of plan of care for manament of COPD   Start Date: 08/31/2021  Expected End Date: 02/21/2022  This Visit's Progress: On track  Recent Progress: On track  Priority: High  Note:   Current Barriers:  Knowledge Deficits related to plan of care for management of COPD    RNCM Clinical Goal(s):  Patient will verbalize understanding of plan for management of COPD as evidenced by self report and chart review  through collaboration with RN Care manager, provider, and care team.   Interventions: Inter-disciplinary care team collaboration (see longitudinal plan of care) Evaluation of current treatment plan related to  self management and patient's adherence to plan as established by provider   COPD Interventions:  (Status:  Goal on track:  Yes.) Long Term Goal Provided patient with basic written and verbal COPD education on self care/management/and exacerbation prevention Advised patient to track and manage COPD triggers Provided written and verbal instructions on pursed lip breathing and utilized returned demonstration as teach back Provided instruction about proper use of medications used for management of COPD including inhalers Advised patient to self assesses COPD action plan zone and make appointment with provider if in the yellow zone for 48 hours without improvement Provided education about and advised patient to utilize infection prevention strategies to reduce risk of respiratory infection Discussed the importance of adequate rest and management of fatigue with COPD Screening for signs and symptoms of depression related to chronic disease state  Assessed social determinant of health barriers  TOC week #3-3/15: Pt continues to do well however "alittle under the weather" today but remains in the GREEN zone with her ongoing COPD and management of care. Continue to have support from her daughter. No acute issues or related symptoms with exacerbation related to her COPD. Pt remains aware to follow up with provider with acute needs in the YELLOW zone. Will follow up next week with ongoing transition of care call next week. TOC week #4-3/28: Readmit on 3/22 "coughing up blood". Pt denies any additional symptoms however continue to utilize her ongoing COPD  treatment and home O2 with some sputum that is clear, no additional bleeding. Pt feeling better and verifies she is taking all her prescribed medications with no acute issues and sufficient transportation to all medical appointments. Spoke with pt today and who inquired on received DME for a pure-wick and hospital bed. This has been discussed in the pass and pt  has spoken with her provider and started this process however DME agency having issues with getting pt's insurance to cover this request. Pt aware to call her insurance customer services for assist with DME coverage. Pt called [facility] hotline today assuming this is something that we can approved. Pt educated on services once again and the use of the [facility] RN hotline, daughter also updated as they were not aware.  No other issues reported. RN reiterated on the plan of care and answer all inquires and questions. Strongly encourage adherence with pt's following the plan of care. Will follow up next week with any acute needs and continue to address issues accordingly.  TOC last hospital 3/31-4/4 for Acute upper GI bleed: RN spoke with pt today and received an update. Pt continues to recover well with no signs of bleeding however pt reports unable to find the source of the bleed. Pt states her breathing is much improved with her ongoing home O2 2 liters. Pt remains in the GREEN zone with no symptoms at this time. Pt remains aware on who to call with any symptoms of bleed and currently pending an appointment with GI on 4/12 to follow up on her bleeding. Denies any dizziness or related symptoms of lost of blood and aware to use caution with all activities until the source is found. Pt continue to have a supportive daughter to assist with any acute issues.  10/05/2021 Update: Pt continues to do well with no acute issues or encounters. Pt continue to remains in the GREEN zone with her COPD. Denies any symptoms of coughing up blood as experienced last month. Currently  awaiting pharmacy to receive orders for her ongoing nebulizer medications. Primary care visit end of April. Reports CBG for  her diabetes remain stable with baseline readings of 135 this AM fasting. Denies any current issues or needs at this time. Will review the current plan of care and intervention accordingly. Pt prefers monthly follow up call moving forward and feel she does not need a weekly contact.  5/16 Update:Pt reports no issues related to her breathing however issues with being very "fatigue" not related to her breathing but very tired and weak. Pt has a follow up appointment with her provider and will address this issues and change. Pt verified she is in the GREEN zone with no acute needs or issues to address at this time. Verified adherence to all her medications and sufficient transportation to get to all her medical appointments. No other issues or needs presented as pt is managing her COPD with no reported issues.   11/14/2021 Update (TOC program restarted post discharge) - Hospitalized for GI bleed requiring transfusion of PRBC.  Denies any further bleeding since discharge home.  Local GI placed referral to [facility] specialist for further testing, she is waiting on call to schedule follow up.  Will see cardiology on 6/6, state she had "fluid on my lungs" while in hospital, started on Lasix. Denies any swelling or additional shortness of breath other than COPD.  Recommended to see pulmonary, advised of where to find contact information for office, state she will call to schedule.  Follow up with PCP scheduled for 6/17.  She has received call from [facility], will make first home visit tomorrow.    11/21/2021 TOC Update: Spoke with pt today who reports she will visit with her PCP today. States she has called [facility] and currently awaiting a call back to scheduled an appointment. Pt denies any breathing issues and continues to be in the GREEN zone for her COPD however pt continue to complain about  ongoing dizziness and cramps and will address these issues with her primary provider today. No falls related to her dizziness however encouraged pt to utilize her assistive devices on fall prevention. Pt feels she is doing well and has opted to receive monthly follow up contacts instead of weekly TOC. Offered to follow up in  approximately three weeks for a telephone assessment (receptive).  Will update provider on pt's disposition with [facility] services.   Patient Goals/Self-Care Activities: Take all medications as prescribed Attend all scheduled provider appointments Call pharmacy for medication refills 3-7 days in advance of running out of medications Attend church or other social activities Perform all self care activities independently  Perform IADL's (shopping, preparing meals, housekeeping, managing finances) independently Call provider office for new concerns or questions  eliminate smoking in my home identify and avoid work-related triggers identify and remove indoor air pollutants limit outdoor activity during cold weather develop a rescue plan eliminate symptom triggers at home follow rescue plan if symptoms flare-up keep follow-up appointments: Continued to encourage pt to follow up with her speciality provider and announce her recent discharge from the hospital for a possible sooner appointment. don't eat or exercise right before bedtime use devices that will help like a cane, sock-puller or reacher do breathing exercises every day  Follow Up Plan:  Telephone follow up appointment with care management team member scheduled for:  June 2023 The patient has been provided with contact information for the care management team and has been advised to call with any health related questions or concerns.        [name], RN Care Management Coordinator [place] Office [phone]

## 2021-11-27 ENCOUNTER — Ambulatory Visit: Payer: Medicare Other | Admitting: Cardiology

## 2021-11-27 ENCOUNTER — Ambulatory Visit: Payer: Medicare Other | Admitting: Student

## 2021-11-27 DIAGNOSIS — G4733 Obstructive sleep apnea (adult) (pediatric): Secondary | ICD-10-CM | POA: Diagnosis not present

## 2021-11-27 DIAGNOSIS — D75839 Thrombocytosis, unspecified: Secondary | ICD-10-CM | POA: Diagnosis not present

## 2021-11-27 DIAGNOSIS — E1151 Type 2 diabetes mellitus with diabetic peripheral angiopathy without gangrene: Secondary | ICD-10-CM | POA: Diagnosis not present

## 2021-11-27 DIAGNOSIS — J9621 Acute and chronic respiratory failure with hypoxia: Secondary | ICD-10-CM | POA: Diagnosis not present

## 2021-11-27 DIAGNOSIS — I5033 Acute on chronic diastolic (congestive) heart failure: Secondary | ICD-10-CM | POA: Diagnosis not present

## 2021-11-27 DIAGNOSIS — E1169 Type 2 diabetes mellitus with other specified complication: Secondary | ICD-10-CM | POA: Diagnosis not present

## 2021-11-27 DIAGNOSIS — B9689 Other specified bacterial agents as the cause of diseases classified elsewhere: Secondary | ICD-10-CM | POA: Diagnosis not present

## 2021-11-27 DIAGNOSIS — K921 Melena: Secondary | ICD-10-CM | POA: Diagnosis not present

## 2021-11-27 DIAGNOSIS — M1712 Unilateral primary osteoarthritis, left knee: Secondary | ICD-10-CM | POA: Diagnosis not present

## 2021-11-27 DIAGNOSIS — K219 Gastro-esophageal reflux disease without esophagitis: Secondary | ICD-10-CM | POA: Diagnosis not present

## 2021-11-27 DIAGNOSIS — F32A Depression, unspecified: Secondary | ICD-10-CM | POA: Diagnosis not present

## 2021-11-27 DIAGNOSIS — J441 Chronic obstructive pulmonary disease with (acute) exacerbation: Secondary | ICD-10-CM | POA: Diagnosis not present

## 2021-11-27 DIAGNOSIS — Z9981 Dependence on supplemental oxygen: Secondary | ICD-10-CM | POA: Diagnosis not present

## 2021-11-27 DIAGNOSIS — D500 Iron deficiency anemia secondary to blood loss (chronic): Secondary | ICD-10-CM | POA: Diagnosis not present

## 2021-11-27 DIAGNOSIS — J440 Chronic obstructive pulmonary disease with acute lower respiratory infection: Secondary | ICD-10-CM | POA: Diagnosis not present

## 2021-11-27 DIAGNOSIS — F17200 Nicotine dependence, unspecified, uncomplicated: Secondary | ICD-10-CM | POA: Diagnosis not present

## 2021-11-27 DIAGNOSIS — K922 Gastrointestinal hemorrhage, unspecified: Secondary | ICD-10-CM | POA: Diagnosis not present

## 2021-11-27 DIAGNOSIS — J208 Acute bronchitis due to other specified organisms: Secondary | ICD-10-CM | POA: Diagnosis not present

## 2021-11-27 DIAGNOSIS — I110 Hypertensive heart disease with heart failure: Secondary | ICD-10-CM | POA: Diagnosis not present

## 2021-11-27 DIAGNOSIS — E782 Mixed hyperlipidemia: Secondary | ICD-10-CM | POA: Diagnosis not present

## 2021-11-27 DIAGNOSIS — I2510 Atherosclerotic heart disease of native coronary artery without angina pectoris: Secondary | ICD-10-CM | POA: Diagnosis not present

## 2021-11-28 ENCOUNTER — Emergency Department (HOSPITAL_COMMUNITY): Payer: Medicare Other

## 2021-11-28 ENCOUNTER — Encounter (HOSPITAL_COMMUNITY): Payer: Self-pay

## 2021-11-28 ENCOUNTER — Inpatient Hospital Stay (HOSPITAL_COMMUNITY)
Admission: EM | Admit: 2021-11-28 | Discharge: 2021-12-04 | DRG: 871 | Disposition: A | Discharge status: Home Health | Payer: Medicare Other | Attending: Internal Medicine | Admitting: Internal Medicine

## 2021-11-28 DIAGNOSIS — A419 Sepsis, unspecified organism: Secondary | ICD-10-CM | POA: Diagnosis not present

## 2021-11-28 DIAGNOSIS — N179 Acute kidney failure, unspecified: Secondary | ICD-10-CM | POA: Diagnosis present

## 2021-11-28 DIAGNOSIS — J9611 Chronic respiratory failure with hypoxia: Secondary | ICD-10-CM | POA: Diagnosis not present

## 2021-11-28 DIAGNOSIS — J440 Chronic obstructive pulmonary disease with acute lower respiratory infection: Secondary | ICD-10-CM | POA: Diagnosis present

## 2021-11-28 DIAGNOSIS — E1151 Type 2 diabetes mellitus with diabetic peripheral angiopathy without gangrene: Secondary | ICD-10-CM | POA: Diagnosis present

## 2021-11-28 DIAGNOSIS — Z743 Need for continuous supervision: Secondary | ICD-10-CM | POA: Diagnosis not present

## 2021-11-28 DIAGNOSIS — I499 Cardiac arrhythmia, unspecified: Secondary | ICD-10-CM | POA: Diagnosis not present

## 2021-11-28 DIAGNOSIS — E119 Type 2 diabetes mellitus without complications: Secondary | ICD-10-CM | POA: Diagnosis not present

## 2021-11-28 DIAGNOSIS — J154 Pneumonia due to other streptococci: Secondary | ICD-10-CM | POA: Diagnosis not present

## 2021-11-28 DIAGNOSIS — Z20822 Contact with and (suspected) exposure to covid-19: Secondary | ICD-10-CM | POA: Diagnosis not present

## 2021-11-28 DIAGNOSIS — I110 Hypertensive heart disease with heart failure: Secondary | ICD-10-CM | POA: Diagnosis present

## 2021-11-28 DIAGNOSIS — I5032 Chronic diastolic (congestive) heart failure: Secondary | ICD-10-CM | POA: Diagnosis not present

## 2021-11-28 DIAGNOSIS — Z95820 Peripheral vascular angioplasty status with implants and grafts: Secondary | ICD-10-CM

## 2021-11-28 DIAGNOSIS — R5381 Other malaise: Secondary | ICD-10-CM | POA: Diagnosis present

## 2021-11-28 DIAGNOSIS — Z7952 Long term (current) use of systemic steroids: Secondary | ICD-10-CM

## 2021-11-28 DIAGNOSIS — F32A Depression, unspecified: Secondary | ICD-10-CM | POA: Diagnosis not present

## 2021-11-28 DIAGNOSIS — Z8616 Personal history of COVID-19: Secondary | ICD-10-CM

## 2021-11-28 DIAGNOSIS — K219 Gastro-esophageal reflux disease without esophagitis: Secondary | ICD-10-CM | POA: Diagnosis present

## 2021-11-28 DIAGNOSIS — R0902 Hypoxemia: Secondary | ICD-10-CM | POA: Diagnosis not present

## 2021-11-28 DIAGNOSIS — Z7984 Long term (current) use of oral hypoglycemic drugs: Secondary | ICD-10-CM

## 2021-11-28 DIAGNOSIS — D500 Iron deficiency anemia secondary to blood loss (chronic): Secondary | ICD-10-CM | POA: Diagnosis not present

## 2021-11-28 DIAGNOSIS — E785 Hyperlipidemia, unspecified: Secondary | ICD-10-CM | POA: Diagnosis not present

## 2021-11-28 DIAGNOSIS — A403 Sepsis due to Streptococcus pneumoniae: Principal | ICD-10-CM | POA: Diagnosis present

## 2021-11-28 DIAGNOSIS — F419 Anxiety disorder, unspecified: Secondary | ICD-10-CM | POA: Diagnosis present

## 2021-11-28 DIAGNOSIS — I471 Supraventricular tachycardia: Secondary | ICD-10-CM | POA: Diagnosis present

## 2021-11-28 DIAGNOSIS — B955 Unspecified streptococcus as the cause of diseases classified elsewhere: Secondary | ICD-10-CM

## 2021-11-28 DIAGNOSIS — R6889 Other general symptoms and signs: Secondary | ICD-10-CM | POA: Diagnosis not present

## 2021-11-28 DIAGNOSIS — G4730 Sleep apnea, unspecified: Secondary | ICD-10-CM | POA: Diagnosis present

## 2021-11-28 DIAGNOSIS — Z9981 Dependence on supplemental oxygen: Secondary | ICD-10-CM | POA: Diagnosis not present

## 2021-11-28 DIAGNOSIS — K922 Gastrointestinal hemorrhage, unspecified: Secondary | ICD-10-CM | POA: Diagnosis present

## 2021-11-28 DIAGNOSIS — R0602 Shortness of breath: Secondary | ICD-10-CM | POA: Diagnosis not present

## 2021-11-28 DIAGNOSIS — Z96652 Presence of left artificial knee joint: Secondary | ICD-10-CM | POA: Diagnosis present

## 2021-11-28 DIAGNOSIS — Z6841 Body Mass Index (BMI) 40.0 and over, adult: Secondary | ICD-10-CM

## 2021-11-28 DIAGNOSIS — J441 Chronic obstructive pulmonary disease with (acute) exacerbation: Secondary | ICD-10-CM | POA: Diagnosis not present

## 2021-11-28 DIAGNOSIS — R7881 Bacteremia: Secondary | ICD-10-CM | POA: Diagnosis not present

## 2021-11-28 DIAGNOSIS — K5900 Constipation, unspecified: Secondary | ICD-10-CM | POA: Diagnosis not present

## 2021-11-28 DIAGNOSIS — J189 Pneumonia, unspecified organism: Secondary | ICD-10-CM | POA: Diagnosis not present

## 2021-11-28 DIAGNOSIS — R6520 Severe sepsis without septic shock: Secondary | ICD-10-CM | POA: Diagnosis not present

## 2021-11-28 DIAGNOSIS — Z833 Family history of diabetes mellitus: Secondary | ICD-10-CM

## 2021-11-28 DIAGNOSIS — J449 Chronic obstructive pulmonary disease, unspecified: Secondary | ICD-10-CM | POA: Diagnosis not present

## 2021-11-28 DIAGNOSIS — I2510 Atherosclerotic heart disease of native coronary artery without angina pectoris: Secondary | ICD-10-CM | POA: Diagnosis present

## 2021-11-28 DIAGNOSIS — Z87891 Personal history of nicotine dependence: Secondary | ICD-10-CM

## 2021-11-28 DIAGNOSIS — R000 Tachycardia, unspecified: Secondary | ICD-10-CM | POA: Diagnosis not present

## 2021-11-28 DIAGNOSIS — Z79899 Other long term (current) drug therapy: Secondary | ICD-10-CM

## 2021-11-28 DIAGNOSIS — Z8249 Family history of ischemic heart disease and other diseases of the circulatory system: Secondary | ICD-10-CM

## 2021-11-28 DIAGNOSIS — R059 Cough, unspecified: Secondary | ICD-10-CM | POA: Diagnosis not present

## 2021-11-28 DIAGNOSIS — Z7982 Long term (current) use of aspirin: Secondary | ICD-10-CM

## 2021-11-28 DIAGNOSIS — Z90710 Acquired absence of both cervix and uterus: Secondary | ICD-10-CM

## 2021-11-28 LAB — LACTIC ACID, PLASMA: Lactic Acid, Venous: 2.5 mmol/L (ref 0.5–1.9)

## 2021-11-28 LAB — CBC WITH DIFFERENTIAL/PLATELET
Abs Immature Granulocytes: 0.57 10*3/uL — ABNORMAL HIGH (ref 0.00–0.07)
Basophils Absolute: 0.1 10*3/uL (ref 0.0–0.1)
Basophils Relative: 0 %
Eosinophils Absolute: 0 10*3/uL (ref 0.0–0.5)
Eosinophils Relative: 0 %
HCT: 27.9 % — ABNORMAL LOW (ref 36.0–46.0)
Hemoglobin: 7.7 g/dL — ABNORMAL LOW (ref 12.0–15.0)
Immature Granulocytes: 2 %
Lymphocytes Relative: 1 %
Lymphs Abs: 0.4 10*3/uL — ABNORMAL LOW (ref 0.7–4.0)
MCH: 21.6 pg — ABNORMAL LOW (ref 26.0–34.0)
MCHC: 27.6 g/dL — ABNORMAL LOW (ref 30.0–36.0)
MCV: 78.4 fL — ABNORMAL LOW (ref 80.0–100.0)
Monocytes Absolute: 1.5 10*3/uL — ABNORMAL HIGH (ref 0.1–1.0)
Monocytes Relative: 5 %
Neutro Abs: 25.7 10*3/uL — ABNORMAL HIGH (ref 1.7–7.7)
Neutrophils Relative %: 92 %
Platelets: 341 10*3/uL (ref 150–400)
RBC: 3.56 MIL/uL — ABNORMAL LOW (ref 3.87–5.11)
RDW: 20.6 % — ABNORMAL HIGH (ref 11.5–15.5)
WBC: 28.2 10*3/uL — ABNORMAL HIGH (ref 4.0–10.5)
nRBC: 0.1 % (ref 0.0–0.2)

## 2021-11-28 LAB — PROTIME-INR
INR: 1.3 — ABNORMAL HIGH (ref 0.8–1.2)
Prothrombin Time: 15.8 seconds — ABNORMAL HIGH (ref 11.4–15.2)

## 2021-11-28 LAB — COMPREHENSIVE METABOLIC PANEL
ALT: 18 U/L (ref 0–44)
AST: 32 U/L (ref 15–41)
Albumin: 2.5 g/dL — ABNORMAL LOW (ref 3.5–5.0)
Alkaline Phosphatase: 59 U/L (ref 38–126)
Anion gap: 8 (ref 5–15)
BUN: 18 mg/dL (ref 8–23)
CO2: 23 mmol/L (ref 22–32)
Calcium: 8.7 mg/dL — ABNORMAL LOW (ref 8.9–10.3)
Chloride: 105 mmol/L (ref 98–111)
Creatinine, Ser: 1.4 mg/dL — ABNORMAL HIGH (ref 0.44–1.00)
GFR, Estimated: 40 mL/min — ABNORMAL LOW (ref >60)
Glucose, Bld: 268 mg/dL — ABNORMAL HIGH (ref 70–99)
Potassium: 4.9 mmol/L (ref 3.5–5.1)
Sodium: 136 mmol/L (ref 135–145)
Total Bilirubin: 0.7 mg/dL (ref 0.3–1.2)
Total Protein: 6.6 g/dL (ref 6.5–8.1)

## 2021-11-28 LAB — APTT: aPTT: 29 seconds (ref 24–36)

## 2021-11-28 LAB — TROPONIN I (HIGH SENSITIVITY): Troponin I (High Sensitivity): 36 ng/L — ABNORMAL HIGH (ref <18)

## 2021-11-28 LAB — SARS CORONAVIRUS 2 BY RT PCR: SARS Coronavirus 2 by RT PCR: NEGATIVE

## 2021-11-28 MED ORDER — SODIUM CHLORIDE 0.9 % IV SOLN
2.0000 g | Freq: Two times a day (BID) | INTRAVENOUS | Status: DC
Start: 2021-11-29 — End: 2021-11-29
  Administered 2021-11-29: 2 g via INTRAVENOUS
  Filled 2021-11-28: qty 12.5

## 2021-11-28 MED ORDER — LACTATED RINGERS IV BOLUS
1000.0000 mL | Freq: Once | INTRAVENOUS | Status: AC
  Administered 2021-11-29: 1000 mL via INTRAVENOUS

## 2021-11-28 MED ORDER — VANCOMYCIN VARIABLE DOSE PER UNSTABLE RENAL FUNCTION (PHARMACIST DOSING): Status: DC

## 2021-11-28 MED ORDER — VANCOMYCIN HCL 2000 MG/400ML IV SOLN
2000.0000 mg | Freq: Once | INTRAVENOUS | Status: AC
Start: 2021-11-28 — End: 2021-11-29
  Administered 2021-11-28: 2000 mg via INTRAVENOUS
  Filled 2021-11-28: qty 400

## 2021-11-28 MED ORDER — VANCOMYCIN HCL IN DEXTROSE 1-5 GM/200ML-% IV SOLN
1000.0000 mg | Freq: Once | INTRAVENOUS | Status: DC
  Filled 2021-11-28: qty 200

## 2021-11-28 MED ORDER — ACETAMINOPHEN 325 MG PO TABS
650.0000 mg | ORAL_TABLET | Freq: Once | ORAL | Status: AC
Start: 2021-11-28 — End: 2021-11-28
  Administered 2021-11-28: 650 mg via ORAL
  Filled 2021-11-28: qty 2

## 2021-11-28 MED ORDER — LACTATED RINGERS IV SOLN: INTRAVENOUS | Status: DC

## 2021-11-28 MED ORDER — LACTATED RINGERS IV BOLUS (SEPSIS)
1000.0000 mL | Freq: Once | INTRAVENOUS | Status: AC
  Administered 2021-11-28: 1000 mL via INTRAVENOUS

## 2021-11-28 MED ORDER — SODIUM CHLORIDE 0.9 % IV SOLN
2.0000 g | Freq: Once | INTRAVENOUS | Status: AC
  Administered 2021-11-28: 2 g via INTRAVENOUS
  Filled 2021-11-28: qty 12.5

## 2021-11-28 NOTE — ED Notes (Signed)
PA at bedside.

## 2021-11-28 NOTE — Sepsis Progress Note (Signed)
Monitoring for the code sepsis protocol. °

## 2021-11-28 NOTE — ED Triage Notes (Signed)
PER EMS: pt from home with c/o SOB x 3 days and productive cough. She was recently a patient here for a blood transfusion for anemia. States the SOB started a couple days after she got home but states it is worse today unrelieved with home albuterol. Denies pain.  EMS adm duo neb and 100 cc NS en route. BP- 120/66, HR- 100. She wears 4L of baseline O2, but ems unable to get initial sat. Pt placed on 6L [place] and O2 was 100%.

## 2021-11-28 NOTE — ED Notes (Signed)
Rebekah PA informed of pts lactic 2.5 via epic secure chat

## 2021-11-28 NOTE — ED Provider Notes (Signed)
[facility] EMERGENCY DEPARTMENT Provider Note   CSN: [id number] Arrival date & time: 11/28/21  2159     History  Chief Complaint  Patient presents with   Shortness of Breath    [name] is a 71 y.o. female who presents via EMS with concern for SOB x 3 days with productive cough. Patient is a poor historian; when asked why she came to the hospital she states that she "just haven't been feeling it".  Per EMS patient required 6 L of oxygen in route due to hypoxia, patient on 4 L supplemental oxygen at baseline.  She does endorse feeling like she had a few referrals for the last few days.  She states that her cough is no worse than normal since she has been diagnosed with COPD but also states that she has been urinating more frequently than normal for her though this appears to be an ongoing problem per patient. Level 5 caveat due to patient's acuity of presentation upon arrival.   I have personally reviewed this patient's medical records.  She was admitted admitted for acute on chronic blood loss anemia secondary to upper GI bleed with acute on chronic respiratory failure.   In addition to the above listed, patient has history of hypertension, bradycardia, type 2 diabetes, COPD, CAD, and depression.  She is not anticoagulated.  HPI     Home Medications Prior to Admission medications   Medication Sig Start Date End Date Taking? Authorizing Provider  acetaminophen (TYLENOL) 500 MG tablet Take 1,000 mg by mouth daily as needed for moderate pain.    [provider]  albuterol (PROVENTIL) (2.5 MG/3ML) 0.083% nebulizer solution Take 2.5 mg by nebulization every 6 (six) hours as needed for wheezing or shortness of breath.    [provider]  albuterol (VENTOLIN HFA) 108 (90 Base) MCG/ACT inhaler Inhale 1-2 puffs into the lungs every 6 (six) hours as needed for wheezing or shortness of breath. 11/13/21   Adhikari, Tamsen Meek, MD  ASPIRIN LOW DOSE 81 MG EC  tablet TAKE 1 TABLET(81 MG) BY MOUTH DAILY Patient taking differently: Take 81 mg by mouth daily. 06/30/20   Magnant, Charles L, PA-C  atorvastatin (LIPITOR) 80 MG tablet Take 80 mg by mouth daily. 08/08/20   [provider]  ezetimibe (ZETIA) 10 MG tablet TAKE 1 TABLET BY MOUTH DAILY Patient taking differently: Take 10 mg by mouth daily. 10/15/21   [name], [name], DO  ferrous sulfate 325 (65 FE) MG tablet Take 325 mg by mouth daily.    [provider]  fluticasone (FLONASE) 50 MCG/ACT nasal spray Place 1 spray into both nostrils daily as needed for allergies. 08/14/21   [provider]  fluticasone furoate-vilanterol (BREO ELLIPTA) 100-25 MCG/ACT AEPB Inhale 1 puff into the lungs daily. 11/13/21   Shelly Coss, MD  furosemide (LASIX) 40 MG tablet Take 1 tablet (40 mg total) by mouth daily. 11/13/21 01/12/22  Shelly Coss, MD  gabapentin (NEURONTIN) 600 MG tablet Take 1 tablet (600 mg total) by mouth at bedtime. 10/02/17   Hyatt, Max T, DPM  ipratropium-albuterol (DUONEB) 0.5-2.5 (3) MG/3ML SOLN Take 3 mLs by nebulization 3 (three) times daily. 11/13/21 01/12/22  Shelly Coss, MD  losartan (COZAAR) 25 MG tablet Take 1 tablet (25 mg total) by mouth daily. 11/13/21 11/13/22  Shelly Coss, MD  metFORMIN (GLUCOPHAGE) 500 MG tablet Take 0.5 tablets (250 mg total) by mouth 2 (two) times daily with a meal. 02/13/17   Adrian Prows, MD  metoprolol tartrate (LOPRESSOR) 25 MG tablet Take 1 tablet (25 mg total) by mouth 2 (two) times daily. 11/13/21 11/13/22  Shelly Coss, MD  MYRBETRIQ 25 MG TB24 tablet Take 25 mg by mouth daily. 08/10/21   [provider]  nitroGLYCERIN (NITROSTAT) 0.4 MG SL tablet Place 1 tablet (0.4 mg total) under the tongue every 5 (five) minutes as needed for chest pain. Patient not taking: Reported on 11/08/2021 01/13/19 12/29/21  Miquel Dunn, NP  OXYGEN Inhale 2 L into the lungs continuous.    [provider]  pantoprazole (PROTONIX) 40 MG  tablet Take 1 tablet (40 mg total) by mouth 2 (two) times daily. 09/25/21   Thurnell Lose, MD  potassium chloride SA (KLOR-CON M) 20 MEQ tablet Take 1 tablet (20 mEq total) by mouth daily. 11/13/21   Shelly Coss, MD  predniSONE (DELTASONE) 10 MG tablet Take 4 tabs daily for 2 days then 3 tabs daily for 3 days then 2 tabs daily for 3 days then 1 tab daily for 3 days then stop 11/14/21   Shelly Coss, MD  spironolactone (ALDACTONE) 50 MG tablet TAKE 1 TABLET(50 MG) BY MOUTH DAILY Patient taking differently: Take 50 mg by mouth daily. 03/09/21   Rex Kras, DO      Allergies    Patient has no known allergies.    Review of Systems   Review of Systems  Constitutional:  Positive for activity change, appetite change, chills, fatigue and fever.  HENT: Negative.    Respiratory:  Positive for cough, shortness of breath and wheezing.   Cardiovascular: Negative.   Gastrointestinal: Negative.        Dark stools  Genitourinary:  Positive for frequency. Negative for dysuria and urgency.  Musculoskeletal: Negative.   Neurological: Negative.     Physical Exam Updated Vital Signs BP (!) 111/45 (BP Location: Right Arm)   Pulse (!) 109   Temp (!) 103.1 F (39.5 C) (Oral)   Resp (!) 25   Wt 94.3 kg   LMP  (LMP Unknown)   SpO2 96%   BMI 40.62 kg/m  Physical Exam Vitals and nursing note reviewed.  Constitutional:      Appearance: She is obese. She is ill-appearing. She is not toxic-appearing.  HENT:     Head: Normocephalic and atraumatic.     Nose: Nose normal.     Mouth/Throat:     Mouth: Mucous membranes are moist.     Pharynx: Oropharynx is clear. Uvula midline. No oropharyngeal exudate or posterior oropharyngeal erythema.     Tonsils: No tonsillar exudate.  Eyes:     General: Lids are normal. Vision grossly intact.        Right eye: No discharge.        Left eye: No discharge.     Extraocular Movements: Extraocular movements intact.     Conjunctiva/sclera: Conjunctivae normal.      Pupils: Pupils are equal, round, and reactive to light.  Neck:     Trachea: Trachea and phonation normal.  Cardiovascular:     Rate and Rhythm: Regular rhythm. Tachycardia present.     Pulses: Normal pulses.     Heart sounds: No murmur heard. Pulmonary:     Effort: Tachypnea and accessory muscle usage present. No respiratory distress.     Breath sounds: No stridor. Examination of the left-lower field reveals wheezing and rales. Wheezing and rales present.     Comments: On 6L to maintain saturations>90% Chest:     Chest wall: No  mass, lacerations, deformity, swelling, tenderness, crepitus or edema.  Abdominal:     General: Bowel sounds are normal. There is no distension.     Palpations: Abdomen is soft.     Tenderness: There is no abdominal tenderness. There is no right CVA tenderness, left CVA tenderness, guarding or rebound.  Musculoskeletal:        General: No deformity.     Cervical back: Normal range of motion and neck supple.     Right lower leg: No edema.     Left lower leg: No edema.  Lymphadenopathy:     Cervical: No cervical adenopathy.  Skin:    General: Skin is warm and dry.     Capillary Refill: Capillary refill takes less than 2 seconds.     Findings: No rash.  Neurological:     Mental Status: She is alert and oriented to person, place, and time. Mental status is at baseline.     GCS: GCS eye subscore is 4. GCS verbal subscore is 5. GCS motor subscore is 6.  Psychiatric:        Mood and Affect: Mood normal.     ED Results / Procedures / Treatments   Labs (all labs ordered are listed, but only abnormal results are displayed) Labs Reviewed  CULTURE, BLOOD (ROUTINE X 2)  CULTURE, BLOOD (ROUTINE X 2)  SARS CORONAVIRUS 2 BY RT PCR  RESP PANEL BY RT-PCR (FLU A&B, COVID) ARPGX2  URINE CULTURE  COMPREHENSIVE METABOLIC PANEL  LACTIC ACID, PLASMA  LACTIC ACID, PLASMA  CBC WITH DIFFERENTIAL/PLATELET  PROTIME-INR  URINALYSIS, ROUTINE W REFLEX MICROSCOPIC  APTT   BRAIN NATRIURETIC PEPTIDE    EKG None  Radiology DG CHEST PORT 1 VIEW  Result Date: 11/28/2021 CLINICAL DATA:  Shortness of breath and cough. EXAM: PORTABLE CHEST 1 VIEW COMPARISON:  Chest x-ray 11/09/2021. FINDINGS: Heart is enlarged, unchanged. There are scattered patchy opacities in the left mid and lower lung. The costophrenic angles are clear. No pneumothorax. No acute fractures. IMPRESSION: 1. Patchy left lower lung opacities may represent atelectasis or infection. 2.  Mild cardiomegaly is unchanged. Electronically Signed   By: Ronney Asters M.D.   On: 11/28/2021 22:30    Procedures .Critical Care  Performed by: Emeline Darling, PA-C Authorized by: Emeline Darling, PA-C   Critical care provider statement:    Critical care time (minutes):  45   Critical care was time spent personally by me on the following activities:  Development of treatment plan with patient or surrogate, discussions with consultants, evaluation of patient's response to treatment, examination of patient, obtaining history from patient or surrogate, ordering and performing treatments and interventions, ordering and review of laboratory studies, ordering and review of radiographic studies, pulse oximetry and re-evaluation of patient's condition    Medications Ordered in ED Medications  lactated ringers infusion (has no administration in time range)  vancomycin (VANCOCIN) IVPB 1000 mg/200 mL premix (has no administration in time range)  ceFEPIme (MAXIPIME) 2 g in sodium chloride 0.9 % 100 mL IVPB (has no administration in time range)  lactated ringers bolus 1,000 mL (has no administration in time range)    ED Course/ Medical Decision Making/ A&P Clinical Course as of 11/29/21 0600  Thu Nov 29, 2021  0036 Consult to hospitalist Dr. Hal Hope, who is agreeable to admitting this patient for sepsis secondary to pneumonia. I appreciate his collaboration in the care of this patient.  [RS]    Clinical  Course User Index [RS] Silverio Decamp  Alfonso Patten, PA-C                           Medical Decision Making 71 year old female presents with concern for shortness of breath.  She was febrile on intake to 103 F , tachycardic, tachypneic, and hypoxic to the 80s.  Cardiac exam with tachycardia with regular rhythm, mild murmur, left lung base with wheezing and crackles.  Increased work of breathing with accessory muscle use, tachypnea without retractions, stridor, or grunting.  Oxygen saturations improved to the mid 90s on 6 L by nasal cannula.    Patient does meet code sepsis criteria on intake given fever, tachycardia, and tachypnea. Code sepsis activated, we will proceed with antibiotics at this time for suspected pneumonia, HCAP given recent admission to the hospital.  Differential includes but is not limited to Pneumonia, CHF, pleural effusion, PE, ACS.      Amount and/or Complexity of Data Reviewed Labs: ordered.    Details: CBC with leukocytosis of 28,000, anemia with hemoglobin of 7.7, ANC 25.  CMP with AKI with creatinine of 1.4 doubled from patient's baseline of 0.7.  INR elevated 1.3.  Troponin mildly elevated to 36 likely demand ischemia from increased work of breathing.  COVID test is negative.   . Radiology: ordered.    Details: Chest x-ray visualized by this provider with patchy left lower lung opacity consistent with suspected diagnosis of pneumonia, likely HCAP given recent admission. ECG/medicine tests: ordered.    Details: EKG with sinus rhythm without STEMI  Risk OTC drugs. Prescription drug management. Decision regarding hospitalization.   Sepsis order set used for 30/kg bolus in context of normal EF on echo and broad-spectrum for HCAP ordered.   Patient will require admission to the hospital for sepsis secondary to her pneumonia.  Consult to hospitalist as above.  Shaneeka  voiced understanding of her medical evaluation and treatment plan. Each of their questions  answered to their expressed satisfaction.  She is amenable to plan for admission. This chart was dictated using voice recognition software, Dragon. Despite the best efforts of this provider to proofread and correct errors, errors may still occur which can change documentation meaning.   Final Clinical Impression(s) / ED Diagnoses Final diagnoses:  None    Rx / DC Orders ED Discharge Orders     None         Aura Dials 11/29/21 0962    Charlesetta Shanks, MD 12/02/21 1627

## 2021-11-28 NOTE — Progress Notes (Signed)
[name] a 71 y.o. female admitted on 11/28/2021 with shortness of breath on 6L  (baseline 4L for COPD) and concern for pneumonia.  Pharmacy has been consulted for vancomycin/cefepime dosing.  11/28/2021: Scr 1.40 (BL ~0.8), WBC 28.2  Vital Signs: Tm 103.1, HR elevated, BP soft  Estimated Creatinine Clearance: 37.8 mL/min (A) (by C-G formula based on SCr of 1.4 mg/dL (H)).  Plan: START Cefepime 2g IV Q12H GIVE Vancomycin 2,000 mg IV x1 (Wt used: 94 kg)  Vancomycin dosing per unstable renal function (Patient noted to have AKI) Monitor renal function, clinical status, de-escalation, C/S, levels as indicated  F/U MRSA PCR   Allergies:  No Known Allergies  Filed Weights   11/28/21 2204  Weight: 94.3 kg (208 lb)       Latest Ref Rng & Units 11/28/2021   10:23 PM 11/12/2021    4:07 AM 11/11/2021    1:27 AM  CBC  WBC 4.0 - 10.5 K/uL 28.2      Hemoglobin 12.0 - 15.0 g/dL 7.7   8.9   8.0    Hematocrit 36.0 - 46.0 % 27.9   32.5   28.9    Platelets 150 - 400 K/uL 341        Antibiotics Given (last 72 hours)     Date/Time Action Medication Dose Rate   11/28/21 2306 New Bag/Given   ceFEPIme (MAXIPIME) 2 g in sodium chloride 0.9 % 100 mL IVPB 2 g 200 mL/hr       Antimicrobials this admission: Vancomycin 11/28/2021>>  Cefepime 11/28/2021>>  Microbiology results: 11/28/2021 Bcx: sent 11/28/2021 Ucx: sent  11/28/2021 MRSA PCR: sent   Thank you for allowing pharmacy to be a part of this patient's care.  Adria Dill, PharmD PGY-1 Acute Care Resident  11/28/2021 11:29 PM

## 2021-11-28 NOTE — ED Provider Notes (Signed)
I provided a substantive portion of the care of this patient.  I personally performed the entirety of the exam for this encounter.     Patient reports she just does not feel good.  She cannot specify quality or character.  She reports she has had a productive cough as long as she has had COPD.  Patient brought by EMS reporting shortness of breath for several days.  Reportedly on EMS arrival they were not able to read an actual oxygen saturation and had placed the patient on 6 L oxygen.  At baseline patient is on 4 L oxygen.  Patient is awake and alert.  She has mild to moderate increased work of breathing at rest.  She is speaking in full short sentences.  Adequate airflow with some crackle and wheeze at the left base.  Tachycardia.  Abdomen soft nontender.  No significant peripheral edema.  Patient is febrile at 103 with tachycardia and tachypnea.  I agree with initiating sepsis protocol.  I agree with Plan of management.   Charlesetta Shanks, MD 11/28/21 2233

## 2021-11-29 ENCOUNTER — Encounter (HOSPITAL_COMMUNITY): Payer: Self-pay | Admitting: Internal Medicine

## 2021-11-29 ENCOUNTER — Other Ambulatory Visit: Payer: Self-pay | Admitting: *Deleted

## 2021-11-29 DIAGNOSIS — B955 Unspecified streptococcus as the cause of diseases classified elsewhere: Secondary | ICD-10-CM | POA: Diagnosis not present

## 2021-11-29 DIAGNOSIS — J9611 Chronic respiratory failure with hypoxia: Secondary | ICD-10-CM

## 2021-11-29 DIAGNOSIS — D500 Iron deficiency anemia secondary to blood loss (chronic): Secondary | ICD-10-CM | POA: Diagnosis present

## 2021-11-29 DIAGNOSIS — E1151 Type 2 diabetes mellitus with diabetic peripheral angiopathy without gangrene: Secondary | ICD-10-CM | POA: Diagnosis not present

## 2021-11-29 DIAGNOSIS — F32A Depression, unspecified: Secondary | ICD-10-CM | POA: Diagnosis present

## 2021-11-29 DIAGNOSIS — I110 Hypertensive heart disease with heart failure: Secondary | ICD-10-CM | POA: Diagnosis not present

## 2021-11-29 DIAGNOSIS — A403 Sepsis due to Streptococcus pneumoniae: Secondary | ICD-10-CM | POA: Diagnosis not present

## 2021-11-29 DIAGNOSIS — J449 Chronic obstructive pulmonary disease, unspecified: Secondary | ICD-10-CM | POA: Diagnosis not present

## 2021-11-29 DIAGNOSIS — R6520 Severe sepsis without septic shock: Secondary | ICD-10-CM | POA: Diagnosis not present

## 2021-11-29 DIAGNOSIS — I471 Supraventricular tachycardia: Secondary | ICD-10-CM | POA: Diagnosis not present

## 2021-11-29 DIAGNOSIS — K922 Gastrointestinal hemorrhage, unspecified: Secondary | ICD-10-CM | POA: Diagnosis present

## 2021-11-29 DIAGNOSIS — Z20822 Contact with and (suspected) exposure to covid-19: Secondary | ICD-10-CM | POA: Diagnosis not present

## 2021-11-29 DIAGNOSIS — Z6841 Body Mass Index (BMI) 40.0 and over, adult: Secondary | ICD-10-CM | POA: Diagnosis not present

## 2021-11-29 DIAGNOSIS — E785 Hyperlipidemia, unspecified: Secondary | ICD-10-CM | POA: Diagnosis present

## 2021-11-29 DIAGNOSIS — J189 Pneumonia, unspecified organism: Secondary | ICD-10-CM | POA: Diagnosis present

## 2021-11-29 DIAGNOSIS — K219 Gastro-esophageal reflux disease without esophagitis: Secondary | ICD-10-CM | POA: Diagnosis present

## 2021-11-29 DIAGNOSIS — Z9981 Dependence on supplemental oxygen: Secondary | ICD-10-CM | POA: Diagnosis not present

## 2021-11-29 DIAGNOSIS — Z8616 Personal history of COVID-19: Secondary | ICD-10-CM | POA: Diagnosis not present

## 2021-11-29 DIAGNOSIS — I5032 Chronic diastolic (congestive) heart failure: Secondary | ICD-10-CM | POA: Diagnosis not present

## 2021-11-29 DIAGNOSIS — A419 Sepsis, unspecified organism: Secondary | ICD-10-CM | POA: Diagnosis not present

## 2021-11-29 DIAGNOSIS — Z95820 Peripheral vascular angioplasty status with implants and grafts: Secondary | ICD-10-CM | POA: Diagnosis not present

## 2021-11-29 DIAGNOSIS — N179 Acute kidney failure, unspecified: Secondary | ICD-10-CM | POA: Diagnosis not present

## 2021-11-29 DIAGNOSIS — J154 Pneumonia due to other streptococci: Secondary | ICD-10-CM | POA: Diagnosis present

## 2021-11-29 DIAGNOSIS — R5381 Other malaise: Secondary | ICD-10-CM | POA: Diagnosis present

## 2021-11-29 DIAGNOSIS — J440 Chronic obstructive pulmonary disease with acute lower respiratory infection: Secondary | ICD-10-CM | POA: Diagnosis not present

## 2021-11-29 DIAGNOSIS — F419 Anxiety disorder, unspecified: Secondary | ICD-10-CM | POA: Diagnosis present

## 2021-11-29 DIAGNOSIS — I2510 Atherosclerotic heart disease of native coronary artery without angina pectoris: Secondary | ICD-10-CM | POA: Diagnosis present

## 2021-11-29 DIAGNOSIS — R7881 Bacteremia: Secondary | ICD-10-CM | POA: Diagnosis not present

## 2021-11-29 DIAGNOSIS — E119 Type 2 diabetes mellitus without complications: Secondary | ICD-10-CM | POA: Diagnosis not present

## 2021-11-29 LAB — HIV ANTIBODY (ROUTINE TESTING W REFLEX): HIV Screen 4th Generation wRfx: NONREACTIVE

## 2021-11-29 LAB — URINALYSIS, ROUTINE W REFLEX MICROSCOPIC
Bilirubin Urine: NEGATIVE
Glucose, UA: NEGATIVE mg/dL
Ketones, ur: NEGATIVE mg/dL
Leukocytes,Ua: NEGATIVE
Nitrite: NEGATIVE
Protein, ur: 100 mg/dL — AB
Specific Gravity, Urine: 1.02 (ref 1.005–1.030)
pH: 6 (ref 5.0–8.0)

## 2021-11-29 LAB — BLOOD CULTURE ID PANEL (REFLEXED) - BCID2

## 2021-11-29 LAB — CBC
HCT: 27.8 % — ABNORMAL LOW (ref 36.0–46.0)
Hemoglobin: 7.5 g/dL — ABNORMAL LOW (ref 12.0–15.0)
MCH: 21.5 pg — ABNORMAL LOW (ref 26.0–34.0)
MCHC: 27 g/dL — ABNORMAL LOW (ref 30.0–36.0)
MCV: 79.7 fL — ABNORMAL LOW (ref 80.0–100.0)
Platelets: 310 10*3/uL (ref 150–400)
RBC: 3.49 MIL/uL — ABNORMAL LOW (ref 3.87–5.11)
RDW: 20.8 % — ABNORMAL HIGH (ref 11.5–15.5)
WBC: 33 10*3/uL — ABNORMAL HIGH (ref 4.0–10.5)
nRBC: 0.2 % (ref 0.0–0.2)

## 2021-11-29 LAB — GLUCOSE, CAPILLARY
Glucose-Capillary: 140 mg/dL — ABNORMAL HIGH (ref 70–99)
Glucose-Capillary: 144 mg/dL — ABNORMAL HIGH (ref 70–99)

## 2021-11-29 LAB — BASIC METABOLIC PANEL
Anion gap: 10 (ref 5–15)
BUN: 19 mg/dL (ref 8–23)
CO2: 21 mmol/L — ABNORMAL LOW (ref 22–32)
Calcium: 8.4 mg/dL — ABNORMAL LOW (ref 8.9–10.3)
Chloride: 103 mmol/L (ref 98–111)
Creatinine, Ser: 1.24 mg/dL — ABNORMAL HIGH (ref 0.44–1.00)
GFR, Estimated: 47 mL/min — ABNORMAL LOW (ref >60)
Glucose, Bld: 180 mg/dL — ABNORMAL HIGH (ref 70–99)
Potassium: 4.7 mmol/L (ref 3.5–5.1)
Sodium: 134 mmol/L — ABNORMAL LOW (ref 135–145)

## 2021-11-29 LAB — STREP PNEUMONIAE URINARY ANTIGEN: Strep Pneumo Urinary Antigen: NEGATIVE

## 2021-11-29 LAB — CBG MONITORING, ED
Glucose-Capillary: 166 mg/dL — ABNORMAL HIGH (ref 70–99)
Glucose-Capillary: 146 mg/dL — ABNORMAL HIGH (ref 70–99)

## 2021-11-29 LAB — TROPONIN I (HIGH SENSITIVITY): Troponin I (High Sensitivity): 50 ng/L — ABNORMAL HIGH (ref <18)

## 2021-11-29 LAB — LACTIC ACID, PLASMA
Lactic Acid, Venous: 2.3 mmol/L (ref 0.5–1.9)
Lactic Acid, Venous: 1.6 mmol/L (ref 0.5–1.9)
Lactic Acid, Venous: 1.4 mmol/L (ref 0.5–1.9)

## 2021-11-29 LAB — BRAIN NATRIURETIC PEPTIDE: B Natriuretic Peptide: 232.1 pg/mL — ABNORMAL HIGH (ref 0.0–100.0)

## 2021-11-29 MED ORDER — ASPIRIN 81 MG PO TBEC
81.0000 mg | DELAYED_RELEASE_TABLET | Freq: Every day | ORAL | Status: DC
  Administered 2021-11-30 – 2021-12-04 (×5): 81 mg via ORAL
  Filled 2021-11-29 (×5): qty 1

## 2021-11-29 MED ORDER — PANTOPRAZOLE SODIUM 40 MG PO TBEC
40.0000 mg | DELAYED_RELEASE_TABLET | Freq: Two times a day (BID) | ORAL | Status: DC
  Administered 2021-11-29 – 2021-12-04 (×11): 40 mg via ORAL
  Filled 2021-11-29 (×11): qty 1

## 2021-11-29 MED ORDER — EZETIMIBE 10 MG PO TABS
10.0000 mg | ORAL_TABLET | Freq: Every day | ORAL | Status: DC
  Administered 2021-11-29 – 2021-12-04 (×6): 10 mg via ORAL
  Filled 2021-11-29 (×6): qty 1

## 2021-11-29 MED ORDER — ATORVASTATIN CALCIUM 80 MG PO TABS
80.0000 mg | ORAL_TABLET | Freq: Every day | ORAL | Status: DC
  Administered 2021-11-29 – 2021-12-04 (×6): 80 mg via ORAL
  Filled 2021-11-29 (×4): qty 1
  Filled 2021-11-29: qty 2
  Filled 2021-11-29: qty 1

## 2021-11-29 MED ORDER — IPRATROPIUM-ALBUTEROL 0.5-2.5 (3) MG/3ML IN SOLN
3.0000 mL | RESPIRATORY_TRACT | Status: DC | PRN
  Filled 2021-11-29: qty 3

## 2021-11-29 MED ORDER — INSULIN ASPART 100 UNIT/ML IJ SOLN
0.0000 [IU] | Freq: Three times a day (TID) | INTRAMUSCULAR | Status: DC
  Administered 2021-11-29: 1 [IU] via SUBCUTANEOUS
  Administered 2021-11-29: 2 [IU] via SUBCUTANEOUS
  Administered 2021-11-29 – 2021-11-30 (×3): 1 [IU] via SUBCUTANEOUS
  Administered 2021-11-30 – 2021-12-01 (×2): 2 [IU] via SUBCUTANEOUS
  Administered 2021-12-01 – 2021-12-02 (×2): 1 [IU] via SUBCUTANEOUS
  Administered 2021-12-02: 3 [IU] via SUBCUTANEOUS
  Administered 2021-12-03: 1 [IU] via SUBCUTANEOUS
  Administered 2021-12-04: 2 [IU] via SUBCUTANEOUS

## 2021-11-29 MED ORDER — HEPARIN SODIUM (PORCINE) 5000 UNIT/ML IJ SOLN
5000.0000 [IU] | Freq: Three times a day (TID) | INTRAMUSCULAR | Status: DC
  Administered 2021-11-29 – 2021-12-04 (×15): 5000 [IU] via SUBCUTANEOUS
  Filled 2021-11-29 (×15): qty 1

## 2021-11-29 MED ORDER — LACTATED RINGERS IV BOLUS
800.0000 mL | Freq: Once | INTRAVENOUS | Status: AC
  Administered 2021-11-29: 800 mL via INTRAVENOUS

## 2021-11-29 MED ORDER — SODIUM CHLORIDE 0.9 % IV SOLN
500.0000 mg | INTRAVENOUS | Status: DC
  Administered 2021-11-29: 500 mg via INTRAVENOUS
  Filled 2021-11-29: qty 5

## 2021-11-29 MED ORDER — ARFORMOTEROL TARTRATE 15 MCG/2ML IN NEBU
15.0000 ug | INHALATION_SOLUTION | Freq: Two times a day (BID) | RESPIRATORY_TRACT | Status: DC
  Administered 2021-11-29 – 2021-12-04 (×11): 15 ug via RESPIRATORY_TRACT
  Filled 2021-11-29 (×11): qty 2

## 2021-11-29 MED ORDER — MIRABEGRON ER 25 MG PO TB24
25.0000 mg | ORAL_TABLET | Freq: Every day | ORAL | Status: DC
  Administered 2021-11-29 – 2021-12-04 (×6): 25 mg via ORAL
  Filled 2021-11-29 (×6): qty 1

## 2021-11-29 MED ORDER — SODIUM CHLORIDE 0.9 % IV SOLN
2.0000 g | INTRAVENOUS | Status: DC
  Administered 2021-11-29 – 2021-12-03 (×5): 2 g via INTRAVENOUS
  Filled 2021-11-29 (×6): qty 20

## 2021-11-29 MED ORDER — ACETAMINOPHEN 325 MG PO TABS
650.0000 mg | ORAL_TABLET | Freq: Four times a day (QID) | ORAL | Status: DC | PRN
  Administered 2021-12-01: 650 mg via ORAL
  Filled 2021-11-29: qty 2

## 2021-11-29 MED ORDER — ACETAMINOPHEN 650 MG RE SUPP: 650.0000 mg | Freq: Four times a day (QID) | RECTAL | Status: DC | PRN

## 2021-11-29 MED ORDER — REVEFENACIN 175 MCG/3ML IN SOLN
175.0000 ug | Freq: Every day | RESPIRATORY_TRACT | Status: DC
  Administered 2021-11-29 – 2021-12-04 (×6): 175 ug via RESPIRATORY_TRACT
  Filled 2021-11-29 (×6): qty 3

## 2021-11-29 MED ORDER — BUDESONIDE 0.25 MG/2ML IN SUSP
0.2500 mg | Freq: Two times a day (BID) | RESPIRATORY_TRACT | Status: DC
  Administered 2021-11-29 – 2021-12-04 (×11): 0.25 mg via RESPIRATORY_TRACT
  Filled 2021-11-29 (×11): qty 2

## 2021-11-29 MED ORDER — FERROUS SULFATE 325 (65 FE) MG PO TABS
325.0000 mg | ORAL_TABLET | Freq: Every day | ORAL | Status: DC
  Administered 2021-11-29 – 2021-12-04 (×6): 325 mg via ORAL
  Filled 2021-11-29 (×6): qty 1

## 2021-11-29 MED ORDER — GUAIFENESIN ER 600 MG PO TB12
600.0000 mg | ORAL_TABLET | Freq: Two times a day (BID) | ORAL | Status: DC
  Administered 2021-11-29 – 2021-12-04 (×10): 600 mg via ORAL
  Filled 2021-11-29 (×10): qty 1

## 2021-11-29 MED ORDER — LACTATED RINGERS IV SOLN: INTRAVENOUS | Status: AC

## 2021-11-29 MED ORDER — GABAPENTIN 600 MG PO TABS
600.0000 mg | ORAL_TABLET | Freq: Every day | ORAL | Status: DC
  Administered 2021-11-29 – 2021-12-03 (×6): 600 mg via ORAL
  Filled 2021-11-29 (×6): qty 1

## 2021-11-29 MED ORDER — FLUTICASONE FUROATE-VILANTEROL 100-25 MCG/ACT IN AEPB
1.0000 | INHALATION_SPRAY | Freq: Every day | RESPIRATORY_TRACT | Status: DC
Start: 2021-11-29 — End: 2021-11-29

## 2021-11-29 NOTE — Progress Notes (Signed)
PHARMACY - PHYSICIAN COMMUNICATION CRITICAL VALUE ALERT - BLOOD CULTURE IDENTIFICATION (BCID)  [name] is an 71 y.o. female who presented to [facility] on [facility]/28/2021 with a chief complaint of SOB x 3 days with productive cough and determined to have sepsis due to pneumonia. Patient was empirically started on cefepime, vancomycin and azithromycin on 11/28/21.  Assessment:   6/7 Blood cx: 1/4 bottles (aerobic) Streptococcus pneumoniae   Name of physician (or Provider) Contacted: Dr. Oren Binet  Current antibiotics: Cefepime, Azithromycin, and Vancomycin  Changes to prescribed antibiotics recommended:  Discontinue cefepime, azithromycin, and vancomycin Start ceftriaxone 2g IV q24h Recommendations accepted by provider  Results for orders placed or performed during the hospital encounter of 11/28/21  Blood Culture ID Panel (Reflexed) (Collected: 11/28/2021 10:23 PM)  Result Value Ref Range   Enterococcus faecalis NOT DETECTED NOT DETECTED   Enterococcus Faecium NOT DETECTED NOT DETECTED   Listeria monocytogenes NOT DETECTED NOT DETECTED   Staphylococcus species NOT DETECTED NOT DETECTED   Staphylococcus aureus (BCID) NOT DETECTED NOT DETECTED   Staphylococcus epidermidis NOT DETECTED NOT DETECTED   Staphylococcus lugdunensis NOT DETECTED NOT DETECTED   Streptococcus species DETECTED (A) NOT DETECTED   Streptococcus agalactiae NOT DETECTED NOT DETECTED   Streptococcus pneumoniae DETECTED (A) NOT DETECTED   Streptococcus pyogenes NOT DETECTED NOT DETECTED   A.calcoaceticus-baumannii NOT DETECTED NOT DETECTED   Bacteroides fragilis NOT DETECTED NOT DETECTED   Enterobacterales NOT DETECTED NOT DETECTED   Enterobacter cloacae complex NOT DETECTED NOT DETECTED   Escherichia coli NOT DETECTED NOT DETECTED   Klebsiella aerogenes NOT DETECTED NOT DETECTED   Klebsiella oxytoca NOT DETECTED NOT DETECTED   Klebsiella pneumoniae NOT DETECTED NOT DETECTED   Proteus species NOT  DETECTED NOT DETECTED   Salmonella species NOT DETECTED NOT DETECTED   Serratia marcescens NOT DETECTED NOT DETECTED   Haemophilus influenzae NOT DETECTED NOT DETECTED   Neisseria meningitidis NOT DETECTED NOT DETECTED   Pseudomonas aeruginosa NOT DETECTED NOT DETECTED   Stenotrophomonas maltophilia NOT DETECTED NOT DETECTED   Candida albicans NOT DETECTED NOT DETECTED   Candida auris NOT DETECTED NOT DETECTED   Candida glabrata NOT DETECTED NOT DETECTED   Candida krusei NOT DETECTED NOT DETECTED   Candida parapsilosis NOT DETECTED NOT DETECTED   Candida tropicalis NOT DETECTED NOT DETECTED   Cryptococcus neoformans/gattii NOT DETECTED NOT DETECTED    Kaleen Mask 11/29/2021  4:05 PM

## 2021-11-29 NOTE — ED Notes (Signed)
MD notified of second troponin being 50, compared to first 36.  No new orders at this time.

## 2021-11-29 NOTE — Progress Notes (Signed)
PROGRESS NOTE        PATIENT DETAILS Name: [name] Age: 71 y.o. Sex: female Date of Birth: 1951/02/15 Admit Date: 11/28/2021 Admitting Physician Rise Patience, MD SAY:[id number], Iona Beard, MD  Brief Summary: Patient is a 71 y.o.  female with history of COPD on 4 L of oxygen at home, HFpEF, PAD, HTN, chronic GI bleeding due to AVMs-presented with fever, cough, shortness of breath-was found to have sepsis due to PNA and subsequently admitted to the hospitalist service.  Significant events: 6/07>> admit to [facility] for sepsis due to PNA  Significant studies: 5/18>> TTE: EF 23-55%, grade 1 diastolic dysfunction 7/32>> CXR: Left PNA  Significant microbiology data: 6/07>> COVID PCR: Negative 6/07>> blood culture: Pending  Procedures: None  Consults: None   Subjective: Sitting up in bed-appears comfortable-stable on around 4 L of oxygen.  Objective: Vitals: Blood pressure (!) 112/46, pulse 85, temperature 99.1 F (37.3 C), temperature source Oral, resp. rate (!) 32, weight 94.3 kg, SpO2 99 %.   Exam: Gen Exam:Alert awake-not in any distress HEENT:atraumatic, normocephalic Chest: Some scattered rhonchi--otherwise moving air well. CVS:S1S2 regular Abdomen:soft non tender, non distended Extremities:no edema Neurology: Non focal Skin: no rash  Pertinent Labs/Radiology:    Latest Ref Rng & Units 11/29/2021    2:44 AM 11/28/2021   10:23 PM 11/12/2021    4:07 AM  CBC  WBC 4.0 - 10.5 K/uL 33.0  28.2    Hemoglobin 12.0 - 15.0 g/dL 7.5  7.7  8.9   Hematocrit 36.0 - 46.0 % 27.8  27.9  32.5   Platelets 150 - 400 K/uL 310  341      Lab Results  Component Value Date   NA 134 (L) 11/29/2021   K 4.7 11/29/2021   CL 103 11/29/2021   CO2 21 (L) 11/29/2021      Assessment/Plan: Sepsis due to community-acquired pneumonia: Overall stable but continues to have significant leukocytosis-continue antibiotics-follow cultures-monitor  closely.  AKI: Likely hemodynamically mediated in the setting of sepsis/ARB/hypotension.  Renal function improving-continue to follow closely.  COPD: Not in exacerbation-continue bronchodilators.  No indication for steroids at this point.  Chronic hypoxic respiratory failure: On 3-4 L of oxygen at baseline.  Chronic GI bleeding with chronic blood loss anemia: In the setting of AVMs-reviewed prior GI notes-patient has been referred to [facility].  Follow CBC closely and transfuse if significant drop.  HFpEF: Was hypotensive on initial presentation-blood pressure still in the low 100s range-continue to hold diuretics.  CAD: No anginal symptoms.  Continue aspirin/statin  PAD-s/p right SFA PCI 2014: Continue aspirin/statin  HTN: BP currently soft-continue to hold furosemide, losartan, metoprolol, Aldactone.  DM-2 (A1c 4.9 on 3/21): Continue SSI and follow CBG trend.  Resume metformin on discharge.  Recent Labs    11/29/21 0741  GLUCAP 166*    Morbid Obesity: Estimated body mass index is 40.62 kg/m as calculated from the following:   Height as of 11/08/21: 5' (1.524 m).   Weight as of this encounter: 94.3 kg.   Code status:   Code Status: Full Code   DVT Prophylaxis: heparin injection 5,000 Units Start: 11/29/21 0600   Family Communication: Daughter-Danielle-[phone] updated over the phone on 6/8   Disposition Plan: Status is: Inpatient Remains inpatient appropriate because: Sepsis due to PNA-not yet stable for discharge.   Planned Discharge Destination: Home health versus SNF  Diet: Diet Order             Diet heart healthy/carb modified Room service appropriate? Yes; Fluid consistency: Thin; Fluid restriction: 1200 mL Fluid  Diet effective now                     Antimicrobial agents: Anti-infectives (From admission, onward)    Start     Dose/Rate Route Frequency Ordered Stop   11/29/21 1100  ceFEPIme (MAXIPIME) 2 g in sodium chloride 0.9 % 100 mL IVPB         2 g 200 mL/hr over 30 Minutes Intravenous Every 12 hours 11/28/21 2328     11/29/21 0100  azithromycin (ZITHROMAX) 500 mg in sodium chloride 0.9 % 250 mL IVPB        500 mg 250 mL/hr over 60 Minutes Intravenous Every 24 hours 11/29/21 0054 12/04/21 0559   11/28/21 2335  vancomycin variable dose per unstable renal function (pharmacist dosing)         Does not apply See admin instructions 11/28/21 2335     11/28/21 2330  vancomycin (VANCOREADY) IVPB 2000 mg/400 mL        2,000 mg 200 mL/hr over 120 Minutes Intravenous  Once 11/28/21 2328 11/29/21 0222   11/28/21 2245  vancomycin (VANCOCIN) IVPB 1000 mg/200 mL premix  Status:  Discontinued        1,000 mg 200 mL/hr over 60 Minutes Intravenous  Once 11/28/21 2235 11/28/21 2328   11/28/21 2245  ceFEPIme (MAXIPIME) 2 g in sodium chloride 0.9 % 100 mL IVPB        2 g 200 mL/hr over 30 Minutes Intravenous  Once 11/28/21 2235 11/28/21 2336        MEDICATIONS: Scheduled Meds:  atorvastatin  80 mg Oral Daily   ezetimibe  10 mg Oral Daily   ferrous sulfate  325 mg Oral Daily   fluticasone furoate-vilanterol  1 puff Inhalation Daily   gabapentin  600 mg Oral QHS   heparin  5,000 Units Subcutaneous Q8H   insulin aspart  0-9 Units Subcutaneous TID WC   mirabegron ER  25 mg Oral Daily   pantoprazole  40 mg Oral BID   vancomycin variable dose per unstable renal function (pharmacist dosing)   Does not apply See admin instructions   Continuous Infusions:  azithromycin Stopped (11/29/21 0420)   ceFEPime (MAXIPIME) IV     lactated ringers 125 mL/hr at 11/29/21 0221   PRN Meds:.acetaminophen **OR** acetaminophen   I have personally reviewed following labs and imaging studies  LABORATORY DATA: CBC: Recent Labs  Lab 11/28/21 2223 11/29/21 0244  WBC 28.2* 33.0*  NEUTROABS 25.7*  --   HGB 7.7* 7.5*  HCT 27.9* 27.8*  MCV 78.4* 79.7*  PLT 341 242    Basic Metabolic Panel: Recent Labs  Lab 11/28/21 2223 11/29/21 0244  NA 136 134*   K 4.9 4.7  CL 105 103  CO2 23 21*  GLUCOSE 268* 180*  BUN 18 19  CREATININE 1.40* 1.24*  CALCIUM 8.7* 8.4*    GFR: Estimated Creatinine Clearance: 42.7 mL/min (A) (by C-G formula based on SCr of 1.24 mg/dL (H)).  Liver Function Tests: Recent Labs  Lab 11/28/21 2223  AST 32  ALT 18  ALKPHOS 59  BILITOT 0.7  PROT 6.6  ALBUMIN 2.5*   No results for input(s): "LIPASE", "AMYLASE" in the last 168 hours. No results for input(s): "AMMONIA" in the last 168 hours.  Coagulation Profile: Recent Labs  Lab 11/28/21 2223  INR 1.3*    Cardiac Enzymes: No results for input(s): "CKTOTAL", "CKMB", "CKMBINDEX", "TROPONINI" in the last 168 hours.  BNP (last 3 results) No results for input(s): "PROBNP" in the last 8760 hours.  Lipid Profile: No results for input(s): "CHOL", "HDL", "LDLCALC", "TRIG", "CHOLHDL", "LDLDIRECT" in the last 72 hours.  Thyroid Function Tests: No results for input(s): "TSH", "T4TOTAL", "FREET4", "T3FREE", "THYROIDAB" in the last 72 hours.  Anemia Panel: No results for input(s): "VITAMINB12", "FOLATE", "FERRITIN", "TIBC", "IRON", "RETICCTPCT" in the last 72 hours.  Urine analysis:    Component Value Date/Time   COLORURINE YELLOW 04/12/2020 1351   APPEARANCEUR HAZY (A) 04/12/2020 1351   LABSPEC 1.023 04/12/2020 1351   PHURINE 5.0 04/12/2020 1351   GLUCOSEU NEGATIVE 04/12/2020 1351   HGBUR NEGATIVE 04/12/2020 1351   BILIRUBINUR NEGATIVE 04/12/2020 1351   KETONESUR 5 (A) 04/12/2020 1351   PROTEINUR NEGATIVE 04/12/2020 1351   UROBILINOGEN 0.2 04/03/2013 1106   NITRITE NEGATIVE 04/12/2020 1351   LEUKOCYTESUR NEGATIVE 04/12/2020 1351    Sepsis Labs: Lactic Acid, Venous    Component Value Date/Time   LATICACIDVEN 1.4 11/29/2021 0441    MICROBIOLOGY: Recent Results (from the past 240 hour(s))  SARS Coronavirus 2 by RT PCR (hospital order, performed in [place] hospital lab) *cepheid single result test* Anterior Nasal Swab     Status: None    Collection Time: 11/28/21 10:25 PM   Specimen: Anterior Nasal Swab  Result Value Ref Range Status   SARS Coronavirus 2 by RT PCR NEGATIVE NEGATIVE Final    Comment: (NOTE) SARS-CoV-2 target nucleic acids are NOT DETECTED.  The SARS-CoV-2 RNA is generally detectable in upper and lower respiratory specimens during the acute phase of infection. The lowest concentration of SARS-CoV-2 viral copies this assay can detect is 250 copies / mL. A negative result does not preclude SARS-CoV-2 infection and should not be used as the sole basis for treatment or other patient management decisions.  A negative result may occur with improper specimen collection / handling, submission of specimen other than nasopharyngeal swab, presence of viral mutation(s) within the areas targeted by this assay, and inadequate number of viral copies (<250 copies / mL). A negative result must be combined with clinical observations, patient history, and epidemiological information.  Fact Sheet for Patients:   https://www.patel.info/  Fact Sheet for Healthcare Providers: https://hall.com/  This test is not yet approved or  cleared by the [place] FDA and has been authorized for detection and/or diagnosis of SARS-CoV-2 by FDA under an Emergency Use Authorization (EUA).  This EUA will remain in effect (meaning this test can be used) for the duration of the COVID-19 declaration under Section 564(b)(1) of the Act, 21 U.S.C. section 360bbb-3(b)(1), unless the authorization is terminated or revoked sooner.  Performed at [facility], [place] [address] [place], [place] [address]     RADIOLOGY STUDIES/RESULTS: DG CHEST PORT 1 VIEW  Result Date: 11/28/2021 CLINICAL DATA:  Shortness of breath and cough. EXAM: PORTABLE CHEST 1 VIEW COMPARISON:  Chest x-ray 11/09/2021. FINDINGS: Heart is enlarged, unchanged. There are scattered patchy opacities in the left mid and lower  lung. The costophrenic angles are clear. No pneumothorax. No acute fractures. IMPRESSION: 1. Patchy left lower lung opacities may represent atelectasis or infection. 2.  Mild cardiomegaly is unchanged. Electronically Signed   By: Ronney Asters M.D.   On: 11/28/2021 22:30     LOS: 0 days   Oren Binet, MD  [facility]  To contact the attending provider between 7A-7P or the covering provider during after hours 7P-7A, please log into the web site [email or web address] and access using universal [place] password for that web site. If you do not have the password, please call the hospital operator.  11/29/2021, 9:56 AM

## 2021-11-29 NOTE — Patient Outreach (Signed)
[facility]) Care Management  11/29/2021  [name] 1951-03-26 [id number]  Telephone Assessment-Successful   RN responding to a call to the 24 nurse hotline.  RN spoke with pt's daughter [name] [phone] concerning the pt (mother of the caller). Daughter explains the pt went to the hospital on yesterday via EMS and admitted for pneumonia and a viral infection. States pt will be at the hospital for at least one week for treatment then possible go to a SNF for ongoing therapy.   Also states pt will be moving around the same time to a large space with her daughter.   RN will provider daughter with a direct contact number and alert the hospital liaison Natividad Brood, RN) of pt's admission and admission status. Team will alert this RN on pt's discharge disposition. Pt currently remains in the ED.  Will follow up accordingly on pt's disposition with [facility] services.  [name], RN Care Management Coordinator [place] Office [phone]

## 2021-11-29 NOTE — H&P (Addendum)
History and Physical    [name] IRS:[id number] DOB: 08-Sep-1950 DOA: 11/28/2021  PCP: Benito Mccreedy, MD  Patient coming from: Home.  Chief Complaint: Shortness of breath.  HPI: [name] is a 71 y.o. female with history of chronic respiratory failure on 4 L oxygen at home with history of COPD, diastolic CHF, peripheral vascular disease, hypertension, anemia with history of chronic GI bleed presents to the ER because of increasing shortness of breath with productive cough ongoing for the last 1 week.  Denies any chest pain.  Has been having subjective feeling of fever chills.  Cough is productive of sputum which is discolored.  ED Course: In the ER patient is found to be hypotensive with elevated lactic acid and significantly elevated leukocytosis and patient was febrile with temperature 103 F.  Chest x-ray shows features consistent with pneumonia.  Patient was started on empiric antibiotics for pneumonia and admitted for further work-up.  Patient blood pressure was responding to fluids.  Review of Systems: As per HPI, rest all negative.   Past Medical History:  Diagnosis Date   Anemia 12/26/2015   Anxiety    Arthritis    Blood transfusion without reported diagnosis    Chronic pain    resolved per pt 04/12/20   COPD (chronic obstructive pulmonary disease) (HCC)    Coronary artery disease    Depression    Diabetes mellitus without complication (HCC)    type 2   GERD (gastroesophageal reflux disease)    Heart murmur    since birth; Echo 02/18/19: LVEF 00%, grade 1 diastolic dysfunction, mild AS (mean grad 12 mmHg), trace MR/PR, mild TR, PASP 32 mmHg     Hyperlipemia    Hypertension    PVD (peripheral vascular disease) ([place])    right SFA stent 02/11/17 by Dr. Einar Gip   Sleep apnea    does not use cpap   Wears glasses     Past Surgical History:  Procedure Laterality Date   ABDOMINAL HYSTERECTOMY     CARDIAC CATHETERIZATION     CATARACT EXTRACTION      CESAREAN SECTION     COLONOSCOPY N/A 01/08/2013   Procedure: COLONOSCOPY;  Surgeon: Beryle Beams, MD;  Location: [facility];  Service: Endoscopy;  Laterality: N/A;   COLONOSCOPY N/A 12/28/2015   Procedure: COLONOSCOPY;  Surgeon: Carol Ada, MD;  Location: [place];  Service: Endoscopy;  Laterality: N/A;   COLONOSCOPY WITH PROPOFOL N/A 10/05/2020   Procedure: COLONOSCOPY WITH PROPOFOL;  Surgeon: Carol Ada, MD;  [facility];  Service: Endoscopy;  Laterality: N/A;   ENTEROSCOPY N/A 12/28/2015   Procedure: ENTEROSCOPY;  Surgeon: Carol Ada, MD;  Location: [place];  Service: Endoscopy;  Laterality: N/A;   ENTEROSCOPY N/A 02/20/2018   Procedure: ENTEROSCOPY;  Surgeon: Carol Ada, MD;  [facility];  Service: Endoscopy;  Laterality: N/A;   ENTEROSCOPY N/A 03/27/2018   Procedure: ENTEROSCOPY;  Surgeon: Carol Ada, MD;  [facility];  Service: Endoscopy;  Laterality: N/A;   ENTEROSCOPY N/A 10/05/2020   Procedure: ENTEROSCOPY;  Surgeon: Carol Ada, MD;  [facility];  Service: Endoscopy;  Laterality: N/A;   ENTEROSCOPY N/A 05/11/2021   Procedure: ENTEROSCOPY;  Surgeon: Carol Ada, MD;  [facility];  Service: Endoscopy;  Laterality: N/A;   ENTEROSCOPY N/A 09/23/2021   Procedure: ENTEROSCOPY;  Surgeon: Doran Stabler, MD;  Location: [place];  Service: Gastroenterology;  Laterality: N/A;   ESOPHAGOGASTRODUODENOSCOPY N/A 01/08/2013   Procedure: ESOPHAGOGASTRODUODENOSCOPY (EGD);  Surgeon: Saralyn Pilar  Renee Ramus, MD;  Location: [facility];  Service: Endoscopy;  Laterality: N/A;   EYE SURGERY     GIVENS CAPSULE STUDY N/A 09/24/2021   Procedure: GIVENS CAPSULE STUDY;  Surgeon: Carol Ada, MD;  Location: [place];  Service: Gastroenterology;  Laterality: N/A;   HAND SURGERY     HEMOSTASIS CLIP PLACEMENT  05/11/2021   Procedure: HEMOSTASIS CLIP PLACEMENT;  Surgeon: Carol Ada, MD;  [facility];  Service: Endoscopy;;    HOT HEMOSTASIS N/A 12/28/2015   Procedure: HOT HEMOSTASIS (ARGON PLASMA COAGULATION/BICAP);  Surgeon: Carol Ada, MD;  Location: [facility] ENDOSCOPY;  Service: Endoscopy;  Laterality: N/A;   HOT HEMOSTASIS N/A 02/20/2018   Procedure: HOT HEMOSTASIS (ARGON PLASMA COAGULATION/BICAP);  Surgeon: Carol Ada, MD;  Location: [facility];  Service: Endoscopy;  Laterality: N/A;   HOT HEMOSTASIS N/A 03/27/2018   Procedure: HOT HEMOSTASIS (ARGON PLASMA COAGULATION/BICAP);  Surgeon: Carol Ada, MD;  Location: [facility];  Service: Endoscopy;  Laterality: N/A;   HOT HEMOSTASIS N/A 10/05/2020   Procedure: HOT HEMOSTASIS (ARGON PLASMA COAGULATION/BICAP);  Surgeon: Carol Ada, MD;  Location: [facility];  Service: Endoscopy;  Laterality: N/A;   HOT HEMOSTASIS N/A 05/11/2021   Procedure: HOT HEMOSTASIS (ARGON PLASMA COAGULATION/BICAP);  Surgeon: Carol Ada, MD;  Location: [facility];  Service: Endoscopy;  Laterality: N/A;   LEFT HEART CATHETERIZATION WITH CORONARY ANGIOGRAM N/A 03/09/2013   Procedure: LEFT HEART CATHETERIZATION WITH CORONARY ANGIOGRAM;  Surgeon: Laverda Page, MD;  Location: [facility] CATH LAB;  Service: Cardiovascular;  Laterality: N/A;   LOWER EXTREMITY ANGIOGRAM N/A 12/29/2012   Procedure: LOWER EXTREMITY ANGIOGRAM;  Surgeon: Laverda Page, MD;  Location: [facility] CATH LAB;  Service: Cardiovascular;  Laterality: N/A;   LOWER EXTREMITY ANGIOGRAM N/A 10/05/2013   Procedure: LOWER EXTREMITY ANGIOGRAM;  Surgeon: Laverda Page, MD;  Location: [facility] LAB;  Service: Cardiovascular;  Laterality: N/A;   LOWER EXTREMITY ANGIOGRAPHY N/A 02/11/2017   Procedure: Lower Extremity Angiography;  Surgeon: Adrian Prows, MD;  Location: [place] CV LAB;  Service: Cardiovascular;  Laterality: N/A;   PERIPHERAL VASCULAR BALLOON ANGIOPLASTY  02/11/2017   Procedure: PERIPHERAL VASCULAR BALLOON ANGIOPLASTY;  Surgeon: Adrian Prows, MD;  Location: [place] CV LAB;  Service: Cardiovascular;;  Right SFA   PERIPHERAL  VASCULAR INTERVENTION Right 02/11/2017   Procedure: PERIPHERAL VASCULAR INTERVENTION;  Surgeon: Adrian Prows, MD;  Location: [place] CV LAB;  Service: Cardiovascular;  Laterality: Right;  Rt SFA   POLYPECTOMY  10/05/2020   Procedure: POLYPECTOMY;  Surgeon: Carol Ada, MD;  [facility];  Service: Endoscopy;;   stent in right leg  12/29/2012   for blood clot, Dr. Nadyne Coombes   TOTAL KNEE ARTHROPLASTY Left 04/18/2020   TOTAL KNEE ARTHROPLASTY Left 04/18/2020   Procedure: LEFT TOTAL KNEE ARTHROPLASTY;  Surgeon: Meredith Pel, MD;  Location: [place];  Service: Orthopedics;  Laterality: Left;   TYMPANOMASTOIDECTOMY Left 03/08/2014   Procedure: TYMPANOMASTOIDECTOMY LEFT;  Surgeon: Ascencion Dike, MD;  Location: [place];  Service: ENT;  Laterality: Left;   TYMPANOMASTOIDECTOMY  2015   UTERINE FIBROID SURGERY       reports that she has quit smoking. Her smoking use included cigarettes. She has a 20.50 pack-year smoking history. She has never used smokeless tobacco. She reports current alcohol use. She reports current drug use. Drugs: Marijuana and Cocaine.  No Known Allergies  Family History  Problem Relation Age of Onset   Diabetes Mother    Hypertension Mother    Heart disease Mother  Heart disease Father    Hypertension Father    Hypertension Sister    Hypertension Brother    Diabetes Brother    Hypertension Sister    Hypertension Brother    Diabetes Brother    Hypertension Brother    Hypertension Brother    Hypertension Brother     Prior to Admission medications   Medication Sig Start Date End Date Taking? Authorizing Provider  acetaminophen (TYLENOL) 500 MG tablet Take 1,000 mg by mouth daily as needed for moderate pain.    [provider]  albuterol (PROVENTIL) (2.5 MG/3ML) 0.083% nebulizer solution Take 2.5 mg by nebulization every 6 (six) hours as needed for wheezing or shortness of breath.    [provider]  albuterol (VENTOLIN HFA)  108 (90 Base) MCG/ACT inhaler Inhale 1-2 puffs into the lungs every 6 (six) hours as needed for wheezing or shortness of breath. 11/13/21   Adhikari, Tamsen Meek, MD  ASPIRIN LOW DOSE 81 MG EC tablet TAKE 1 TABLET(81 MG) BY MOUTH DAILY Patient taking differently: Take 81 mg by mouth daily. 06/30/20   Magnant, Charles L, PA-C  atorvastatin (LIPITOR) 80 MG tablet Take 80 mg by mouth daily. 08/08/20   [provider]  ezetimibe (ZETIA) 10 MG tablet TAKE 1 TABLET BY MOUTH DAILY Patient taking differently: Take 10 mg by mouth daily. 10/15/21   [name], [name], DO  ferrous sulfate 325 (65 FE) MG tablet Take 325 mg by mouth daily.    [provider]  fluticasone (FLONASE) 50 MCG/ACT nasal spray Place 1 spray into both nostrils daily as needed for allergies. 08/14/21   [provider]  fluticasone furoate-vilanterol (BREO ELLIPTA) 100-25 MCG/ACT AEPB Inhale 1 puff into the lungs daily. 11/13/21   Shelly Coss, MD  furosemide (LASIX) 40 MG tablet Take 1 tablet (40 mg total) by mouth daily. 11/13/21 01/12/22  Shelly Coss, MD  gabapentin (NEURONTIN) 600 MG tablet Take 1 tablet (600 mg total) by mouth at bedtime. 10/02/17   Hyatt, Max T, DPM  ipratropium-albuterol (DUONEB) 0.5-2.5 (3) MG/3ML SOLN Take 3 mLs by nebulization 3 (three) times daily. 11/13/21 01/12/22  Shelly Coss, MD  losartan (COZAAR) 25 MG tablet Take 1 tablet (25 mg total) by mouth daily. 11/13/21 11/13/22  Shelly Coss, MD  metFORMIN (GLUCOPHAGE) 500 MG tablet Take 0.5 tablets (250 mg total) by mouth 2 (two) times daily with a meal. 02/13/17   Adrian Prows, MD  metoprolol tartrate (LOPRESSOR) 25 MG tablet Take 1 tablet (25 mg total) by mouth 2 (two) times daily. 11/13/21 11/13/22  Shelly Coss, MD  MYRBETRIQ 25 MG TB24 tablet Take 25 mg by mouth daily. 08/10/21   [provider]  nitroGLYCERIN (NITROSTAT) 0.4 MG SL tablet Place 1 tablet (0.4 mg total) under the tongue every 5 (five) minutes as needed for chest  pain. Patient not taking: Reported on 11/08/2021 01/13/19 12/29/21  Miquel Dunn, NP  OXYGEN Inhale 2 L into the lungs continuous.    [provider]  pantoprazole (PROTONIX) 40 MG tablet Take 1 tablet (40 mg total) by mouth 2 (two) times daily. 09/25/21   Thurnell Lose, MD  potassium chloride SA (KLOR-CON M) 20 MEQ tablet Take 1 tablet (20 mEq total) by mouth daily. 11/13/21   Shelly Coss, MD  predniSONE (DELTASONE) 10 MG tablet Take 4 tabs daily for 2 days then 3 tabs daily for 3 days then 2 tabs daily for 3 days then 1 tab daily for 3 days then stop 11/14/21  Shelly Coss, MD  spironolactone (ALDACTONE) 50 MG tablet TAKE 1 TABLET(50 MG) BY MOUTH DAILY Patient taking differently: Take 50 mg by mouth daily. 03/09/21   Rex Kras, DO    Physical Exam: Constitutional: Moderately built and nourished. Vitals:   11/28/21 2315 11/28/21 2330 11/28/21 2345 11/28/21 2353  BP: (!) 101/49 (!) 103/47 (!) 99/56   Pulse: 92 93 88   Resp: (!) 23 17 20    Temp:    (!) 102 F (38.9 C)  TempSrc:    Oral  SpO2: 96% 96% 98%   Weight:       Eyes: Anicteric no pallor. ENMT: No discharge from the ears eyes nose and mouth: Neck: No mass felt.  No neck rigidity. Respiratory: Mild expiratory wheezing or crepitations. Cardiovascular: S1-S2 heard. Abdomen: Soft nontender bowel sound present. Musculoskeletal: No edema. Skin: No rash. Neurologic: Alert awake oriented time place and person.  Moves all extremities. Psychiatric: Appears normal.  Normal affect.   Labs on Admission: I have personally reviewed following labs and imaging studies  CBC: Recent Labs  Lab 11/28/21 2223  WBC 28.2*  NEUTROABS 25.7*  HGB 7.7*  HCT 27.9*  MCV 78.4*  PLT 621   Basic Metabolic Panel: Recent Labs  Lab 11/28/21 2223  NA 136  K 4.9  CL 105  CO2 23  GLUCOSE 268*  BUN 18  CREATININE 1.40*  CALCIUM 8.7*   GFR: Estimated Creatinine Clearance: 37.8 mL/min (A) (by C-G formula based on  SCr of 1.4 mg/dL (H)). Liver Function Tests: Recent Labs  Lab 11/28/21 2223  AST 32  ALT 18  ALKPHOS 59  BILITOT 0.7  PROT 6.6  ALBUMIN 2.5*   No results for input(s): LIPASE, AMYLASE in the last 168 hours. No results for input(s): AMMONIA in the last 168 hours. Coagulation Profile: Recent Labs  Lab 11/28/21 2223  INR 1.3*   Cardiac Enzymes: No results for input(s): CKTOTAL, CKMB, CKMBINDEX, TROPONINI in the last 168 hours. BNP (last 3 results) No results for input(s): PROBNP in the last 8760 hours. HbA1C: No results for input(s): HGBA1C in the last 72 hours. CBG: No results for input(s): GLUCAP in the last 168 hours. Lipid Profile: No results for input(s): CHOL, HDL, LDLCALC, TRIG, CHOLHDL, LDLDIRECT in the last 72 hours. Thyroid Function Tests: No results for input(s): TSH, T4TOTAL, FREET4, T3FREE, THYROIDAB in the last 72 hours. Anemia Panel: No results for input(s): VITAMINB12, FOLATE, FERRITIN, TIBC, IRON, RETICCTPCT in the last 72 hours. Urine analysis:    Component Value Date/Time   COLORURINE YELLOW 04/12/2020 1351   APPEARANCEUR HAZY (A) 04/12/2020 1351   LABSPEC 1.023 04/12/2020 1351   PHURINE 5.0 04/12/2020 1351   GLUCOSEU NEGATIVE 04/12/2020 1351   HGBUR NEGATIVE 04/12/2020 1351   BILIRUBINUR NEGATIVE 04/12/2020 1351   KETONESUR 5 (A) 04/12/2020 1351   PROTEINUR NEGATIVE 04/12/2020 1351   UROBILINOGEN 0.2 04/03/2013 1106   NITRITE NEGATIVE 04/12/2020 1351   LEUKOCYTESUR NEGATIVE 04/12/2020 1351   Sepsis Labs: @LABRCNTIP (procalcitonin:4,lacticidven:4) ) Recent Results (from the past 240 hour(s))  SARS Coronavirus 2 by RT PCR (hospital order, performed in [place] hospital lab) *cepheid single result test* Anterior Nasal Swab     Status: None   Collection Time: 11/28/21 10:25 PM   Specimen: Anterior Nasal Swab  Result Value Ref Range Status   SARS Coronavirus 2 by RT PCR NEGATIVE NEGATIVE Final    Comment: (NOTE) SARS-CoV-2 target nucleic acids  are NOT DETECTED.  The SARS-CoV-2 RNA is generally detectable in upper  and lower respiratory specimens during the acute phase of infection. The lowest concentration of SARS-CoV-2 viral copies this assay can detect is 250 copies / mL. A negative result does not preclude SARS-CoV-2 infection and should not be used as the sole basis for treatment or other patient management decisions.  A negative result may occur with improper specimen collection / handling, submission of specimen other than nasopharyngeal swab, presence of viral mutation(s) within the areas targeted by this assay, and inadequate number of viral copies (<250 copies / mL). A negative result must be combined with clinical observations, patient history, and epidemiological information.  Fact Sheet for Patients:   https://www.patel.info/  Fact Sheet for Healthcare Providers: https://hall.com/  This test is not yet approved or  cleared by the [place] FDA and has been authorized for detection and/or diagnosis of SARS-CoV-2 by FDA under an Emergency Use Authorization (EUA).  This EUA will remain in effect (meaning this test can be used) for the duration of the COVID-19 declaration under Section 564(b)(1) of the Act, 21 U.S.C. section 360bbb-3(b)(1), unless the authorization is terminated or revoked sooner.  Performed at [facility], [place] [address] [place], [place] [address]      Radiological Exams on Admission: DG CHEST PORT 1 VIEW  Result Date: 11/28/2021 CLINICAL DATA:  Shortness of breath and cough. EXAM: PORTABLE CHEST 1 VIEW COMPARISON:  Chest x-ray 11/09/2021. FINDINGS: Heart is enlarged, unchanged. There are scattered patchy opacities in the left mid and lower lung. The costophrenic angles are clear. No pneumothorax. No acute fractures. IMPRESSION: 1. Patchy left lower lung opacities may represent atelectasis or infection. 2.  Mild cardiomegaly is unchanged.  Electronically Signed   By: Ronney Asters M.D.   On: 11/28/2021 22:30    EKG: Independently reviewed.  Normal sinus rhythm.  Assessment/Plan Principal Problem:   Sepsis ([place]) Active Problems:   Chronic respiratory failure with hypoxia (HCC)   COPD (chronic obstructive pulmonary disease) (HCC)   Type 2 diabetes mellitus without complication, without long-term current use of insulin ([place])   CAP (community acquired pneumonia)    Sepsis secondary to pneumonia for which patient has been placed on empiric antibiotics.  Follow cultures.  For now continue with hydration follow lactic acid levels.  Check urine for strep antigen and Legionella. Acute renal failure likely from hypotension and patient is also on ARB in the setting of hypotension.  We will hold antihypertensive continue hydration follow metabolic panel UA is pending.  Follow intake output. COPD with mild wheezing at this time with patient at this time requiring 6 L of oxygen.  Likely worsened because of pneumonia.  We will continue with antibiotics and nebulizer.  May need steroids if wheezing worsens. Diabetes mellitus type 2 we will keep patient on sliding scale coverage. Chronic GI bleed with anemia hemoglobin is at baseline.  May need transfusion if there is any further decline. Peripheral vascular disease usually takes aspirin.  Continue with this no decline in hemoglobin. Moderate CAD denies any chest pain we will trend cardiac markers. History of CHF and hypertension holding diuretics and antihypertensives due to hypotension at presentation.  Closely follow blood pressure trends.  Note that patient is receiving fluids at this time so closely follow respiratory status.   Since patient appears septic at presentation with hypotension elevated lactic acid and leukocytosis fever and pneumonia will need close monitoring for any further worsening inpatient status.   DVT prophylaxis: Heparin. Code Status: Full code. Family  Communication: Discussed with patient.  Disposition Plan: Home. Consults called: None. Admission status: Inpatient.   Rise Patience MD [facility] Pager [phone].  If 7PM-7AM, please contact night-coverage [email or web address] Password [email or web address]  11/29/2021, 12:54 AM

## 2021-11-29 NOTE — ED Notes (Signed)
Pt aware a urine sample is needed. Attempted BSC but she only voided a small amount, not enough to send to lab

## 2021-11-30 DIAGNOSIS — J449 Chronic obstructive pulmonary disease, unspecified: Secondary | ICD-10-CM | POA: Diagnosis not present

## 2021-11-30 DIAGNOSIS — A419 Sepsis, unspecified organism: Secondary | ICD-10-CM | POA: Diagnosis not present

## 2021-11-30 DIAGNOSIS — J9611 Chronic respiratory failure with hypoxia: Secondary | ICD-10-CM | POA: Diagnosis not present

## 2021-11-30 DIAGNOSIS — J189 Pneumonia, unspecified organism: Secondary | ICD-10-CM | POA: Diagnosis not present

## 2021-11-30 LAB — RESP PANEL BY RT-PCR (FLU A&B, COVID) ARPGX2
Influenza A by PCR: NEGATIVE
Influenza B by PCR: NEGATIVE
SARS Coronavirus 2 by RT PCR: NEGATIVE

## 2021-11-30 LAB — CBC
HCT: 26.1 % — ABNORMAL LOW (ref 36.0–46.0)
Hemoglobin: 7 g/dL — ABNORMAL LOW (ref 12.0–15.0)
MCH: 21.1 pg — ABNORMAL LOW (ref 26.0–34.0)
MCHC: 26.8 g/dL — ABNORMAL LOW (ref 30.0–36.0)
MCV: 78.9 fL — ABNORMAL LOW (ref 80.0–100.0)
Platelets: 303 10*3/uL (ref 150–400)
RBC: 3.31 MIL/uL — ABNORMAL LOW (ref 3.87–5.11)
RDW: 20.4 % — ABNORMAL HIGH (ref 11.5–15.5)
WBC: 13.4 10*3/uL — ABNORMAL HIGH (ref 4.0–10.5)
nRBC: 0.3 % — ABNORMAL HIGH (ref 0.0–0.2)

## 2021-11-30 LAB — BASIC METABOLIC PANEL
Anion gap: 9 (ref 5–15)
BUN: 12 mg/dL (ref 8–23)
CO2: 22 mmol/L (ref 22–32)
Calcium: 8.6 mg/dL — ABNORMAL LOW (ref 8.9–10.3)
Chloride: 106 mmol/L (ref 98–111)
Creatinine, Ser: 0.76 mg/dL (ref 0.44–1.00)
GFR, Estimated: >60 mL/min (ref >60)
Glucose, Bld: 104 mg/dL — ABNORMAL HIGH (ref 70–99)
Potassium: 4.7 mmol/L (ref 3.5–5.1)
Sodium: 137 mmol/L (ref 135–145)

## 2021-11-30 LAB — GLUCOSE, CAPILLARY
Glucose-Capillary: 152 mg/dL — ABNORMAL HIGH (ref 70–99)
Glucose-Capillary: 200 mg/dL — ABNORMAL HIGH (ref 70–99)
Glucose-Capillary: 121 mg/dL — ABNORMAL HIGH (ref 70–99)
Glucose-Capillary: 137 mg/dL — ABNORMAL HIGH (ref 70–99)
Glucose-Capillary: 147 mg/dL — ABNORMAL HIGH (ref 70–99)

## 2021-11-30 LAB — URINE CULTURE: Culture: NO GROWTH

## 2021-11-30 MED ORDER — NITROGLYCERIN 0.4 MG SL SUBL: 0.4000 mg | SUBLINGUAL_TABLET | SUBLINGUAL | Status: DC | PRN

## 2021-11-30 MED ORDER — MORPHINE SULFATE (PF) 2 MG/ML IV SOLN
2.0000 mg | Freq: Once | INTRAVENOUS | Status: AC
  Administered 2021-11-30: 2 mg via INTRAVENOUS
  Filled 2021-11-30: qty 1

## 2021-11-30 NOTE — Evaluation (Signed)
Occupational Therapy Evaluation Patient Details Name: [name] MRN: [id number] DOB: 08-17-1950 Today's Date: 11/30/2021   History of Present Illness 71 yo female admitted 6/7 with SOB and sepsis due to PNA. PMHx: COPD on 4L, HFpEF, PAD, HTN, GIB, DM   Clinical Impression   Pt admitted for concerns listed above. PTA pt reported that she was independent with all ADL's and functional mobility, using no AD. At this time, pt continues to be independent physically, however she is requiring increased time and increased O2 with mobility. Reviewed energy conservation techniques, pt has no further skilled OT needs and acute OT will sign off.       Recommendations for follow up therapy are one component of a multi-disciplinary discharge planning process, led by the attending physician.  Recommendations may be updated based on patient status, additional functional criteria and insurance authorization.   Follow Up Recommendations  No OT follow up    Assistance Recommended at Discharge Set up Supervision/Assistance  Patient can return home with the following Assistance with cooking/housework;Help with stairs or ramp for entrance    Functional Status Assessment  Patient has had a recent decline in their functional status and demonstrates the ability to make significant improvements in function in a reasonable and predictable amount of time.  Equipment Recommendations  None recommended by OT    Recommendations for Other Services       Precautions / Restrictions Precautions Precautions: Fall Precaution Comments: monitor O2 (wears 3 L O2 at baseline) Restrictions Weight Bearing Restrictions: No      Mobility Bed Mobility Overal bed mobility: Modified Independent                  Transfers Overall transfer level: Independent Equipment used: None Transfers: Sit to/from Stand Sit to Stand: Independent           General transfer comment: incr WOB noted       Balance Overall balance assessment: No apparent balance deficits (not formally assessed)                                         ADL either performed or assessed with clinical judgement   ADL Overall ADL's : Modified independent;At baseline                                       General ADL Comments: Pt able to complee BADL's with increased time, no assist needed. Reviewed eneergy conservation techniques from prior hospitalization.     Vision Baseline Vision/History: 1 Wears glasses Ability to See in Adequate Light: 0 Adequate Patient Visual Report: No change from baseline Vision Assessment?: No apparent visual deficits     Perception     Praxis      Pertinent Vitals/Pain Pain Assessment Pain Assessment: No/denies pain     Hand Dominance Right   Extremity/Trunk Assessment Upper Extremity Assessment Upper Extremity Assessment: Overall WFL for tasks assessed   Lower Extremity Assessment Lower Extremity Assessment: Defer to PT evaluation   Cervical / Trunk Assessment Cervical / Trunk Assessment: Normal   Communication Communication Communication: No difficulties   Cognition Arousal/Alertness: Awake/alert Behavior During Therapy: WFL for tasks assessed/performed Overall Cognitive Status: Within Functional Limits for tasks assessed  General Comments  VSS on 4L with mobility, 3L at rest    Exercises     Shoulder Instructions      Home Living Family/patient expects to be discharged to:: Private residence Living Arrangements: Children Available Help at Discharge: Family;Available PRN/intermittently Type of Home: House Home Access: Stairs to enter [employer] of Steps: 5 Entrance Stairs-Rails: Left Home Layout: One level     Bathroom Shower/Tub: Walk-in shower;Tub/shower unit   Bathroom Toilet: Handicapped height Bathroom Accessibility: Yes How Accessible:  Accessible via walker Home Equipment: Rollator (4 wheels);Cane - single point;Shower seat          Prior Functioning/Environment Prior Level of Function : Independent/Modified Independent             Mobility Comments: denies use of assistive device; denies falls ADLs Comments: reports indep        OT Problem List: Decreased strength;Decreased range of motion;Decreased activity tolerance;Impaired balance (sitting and/or standing);Decreased safety awareness;Decreased knowledge of precautions      OT Treatment/Interventions:      OT Goals(Current goals can be found in the care plan section) Acute Rehab OT Goals Patient Stated Goal: To go home OT Goal Formulation: With patient Time For Goal Achievement: 11/30/21 Potential to Achieve Goals: Good  OT Frequency:      Co-evaluation              AM-PAC OT "6 Clicks" Daily Activity     Outcome Measure Help from another person eating meals?: None Help from another person taking care of personal grooming?: None Help from another person toileting, which includes using toliet, bedpan, or urinal?: None Help from another person bathing (including washing, rinsing, drying)?: None Help from another person to put on and taking off regular upper body clothing?: None Help from another person to put on and taking off regular lower body clothing?: None 6 Click Score: 24   End of Session Equipment Utilized During Treatment: Oxygen Nurse Communication: Mobility status  Activity Tolerance: Patient tolerated treatment well Patient left: in bed;with call bell/phone within reach  OT Visit Diagnosis: Unsteadiness on feet (R26.81);Other abnormalities of gait and mobility (R26.89);Muscle weakness (generalized) (M62.81)                Time: 2263-3354 OT Time Calculation (min): 11 min Charges:  OT General Charges $OT Visit: 1 Visit OT Evaluation $OT Eval Low Complexity: [place]., OTR/L Acute Rehabilitation  Syvilla Martin [place]ane  Yolanda Bonine 11/30/2021, 11:35 AM

## 2021-11-30 NOTE — Progress Notes (Signed)
PROGRESS NOTE        PATIENT DETAILS Name: [name] Age: 71 y.o. Sex: female Date of Birth: 1950/11/19 Admit Date: 11/28/2021 Admitting Physician Rise Patience, MD [id number], Iona Beard, MD  Brief Summary: Patient is a 71 y.o.  female with history of COPD on 4 L of oxygen at home, HFpEF, PAD, HTN, chronic GI bleeding due to AVMs-presented with fever, cough, shortness of breath-was found to have sepsis due to PNA and subsequently admitted to the hospitalist service.  Significant events: 6/07>> admit to [facility] for sepsis due to PNA  Significant studies: 5/18>> TTE: EF 84-69%, grade 1 diastolic dysfunction 6/29>> CXR: Left PNA  Significant microbiology data: 6/07>> COVID PCR: Negative 6/07>> blood culture: 1/2 strep pneumoniae  Procedures: None  Consults: None   Subjective: Feels better-much more comfortable than yesterday.  On around 3 L of oxygen.  Objective: Vitals: Blood pressure 134/72, pulse 83, temperature 97.6 F (36.4 C), temperature source Oral, resp. rate 18, height 5' (1.524 m), weight 97.9 kg, SpO2 91 %.   Exam: Gen Exam:Alert awake-not in any distress HEENT:atraumatic, normocephalic Chest: B/L clear to auscultation anteriorly CVS:S1S2 regular Abdomen:soft non tender, non distended Extremities:no edema Neurology: Non focal Skin: no rash   Pertinent Labs/Radiology:    Latest Ref Rng & Units 11/30/2021    6:41 AM 11/29/2021    2:44 AM 11/28/2021   10:23 PM  CBC  WBC 4.0 - 10.5 K/uL 13.4  33.0  28.2   Hemoglobin 12.0 - 15.0 g/dL 7.0  7.5  7.7   Hematocrit 36.0 - 46.0 % 26.1  27.8  27.9   Platelets 150 - 400 K/uL 303  310  341     Lab Results  Component Value Date   NA 137 11/30/2021   K 4.7 11/30/2021   CL 106 11/30/2021   CO2 22 11/30/2021     Assessment/Plan: Sepsis due to Streptococcus pneumonia bacteremia and community-acquired pneumonia: Sepsis physiology improving-continue IV Rocephin and follow  clinical course.  Await final culture sensitivity results.  AKI: Likely hemodynamically mediated in the setting of sepsis/ARB/hypotension.  Creatinine now has normalized.  COPD: Not in exacerbation-continue bronchodilators.  No indication for steroids at this point.  Chronic hypoxic respiratory failure: On 3-4 L of oxygen at baseline.  Chronic GI bleeding with chronic blood loss anemia: In the setting of AVMs-reviewed prior GI notes-patient has been referred to [facility].  Follow CBC closely and transfuse if Hb<7  Chronic HFpEF: Volume status stable-diuretics remain on hold-we will resume over the next few days when able.  CAD: No anginal symptoms.  Continue aspirin/statin  PAD-s/p right SFA PCI 2014: Continue aspirin/statin  HTN: BP stable-continue to hold furosemide, losartan, metoprolol and Aldactone-resume when blood pressure increases a bit more.    DM-2 (A1c 4.9 on 3/21): Continue SSI and follow CBG trend.  Resume metformin on discharge.  Recent Labs    11/30/21 0739 11/30/21 0844 11/30/21 1248  GLUCAP 152* 200* 121*     Morbid Obesity: Estimated body mass index is 42.15 kg/m as calculated from the following:   Height as of this encounter: 5' (1.524 m).   Weight as of this encounter: 97.9 kg.   Code status:   Code Status: Full Code   DVT Prophylaxis: heparin injection 5,000 Units Start: 11/29/21 0600   Family Communication: Daughter-[phone] updated over the phone on 6/9  Disposition Plan: Status is: Inpatient Remains inpatient appropriate because: Sepsis due to PNA-not yet stable for discharge.   Planned Discharge Destination: Home health versus SNF   Diet: Diet Order             Diet heart healthy/carb modified Room service appropriate? Yes; Fluid consistency: Thin; Fluid restriction: 1200 mL Fluid  Diet effective now                     Antimicrobial agents: Anti-infectives (From admission, onward)    Start     Dose/Rate Route  Frequency Ordered Stop   11/29/21 2200  cefTRIAXone (ROCEPHIN) 2 g in sodium chloride 0.9 % 100 mL IVPB        2 g 200 mL/hr over 30 Minutes Intravenous Every 24 hours 11/29/21 1625     11/29/21 1100  ceFEPIme (MAXIPIME) 2 g in sodium chloride 0.9 % 100 mL IVPB  Status:  Discontinued        2 g 200 mL/hr over 30 Minutes Intravenous Every 12 hours 11/28/21 2328 11/29/21 1625   11/29/21 0100  azithromycin (ZITHROMAX) 500 mg in sodium chloride 0.9 % 250 mL IVPB  Status:  Discontinued        500 mg 250 mL/hr over 60 Minutes Intravenous Every 24 hours 11/29/21 0054 11/29/21 1625   11/28/21 2335  vancomycin variable dose per unstable renal function (pharmacist dosing)  Status:  Discontinued         Does not apply See admin instructions 11/28/21 2335 11/29/21 1625   11/28/21 2330  vancomycin (VANCOREADY) IVPB 2000 mg/400 mL        2,000 mg 200 mL/hr over 120 Minutes Intravenous  Once 11/28/21 2328 11/29/21 0222   11/28/21 2245  vancomycin (VANCOCIN) IVPB 1000 mg/200 mL premix  Status:  Discontinued        1,000 mg 200 mL/hr over 60 Minutes Intravenous  Once 11/28/21 2235 11/28/21 2328   11/28/21 2245  ceFEPIme (MAXIPIME) 2 g in sodium chloride 0.9 % 100 mL IVPB        2 g 200 mL/hr over 30 Minutes Intravenous  Once 11/28/21 2235 11/28/21 2336        MEDICATIONS: Scheduled Meds:  arformoterol  15 mcg Nebulization BID   aspirin EC  81 mg Oral Daily   atorvastatin  80 mg Oral Daily   budesonide (PULMICORT) nebulizer solution  0.25 mg Nebulization BID   ezetimibe  10 mg Oral Daily   ferrous sulfate  325 mg Oral Daily   gabapentin  600 mg Oral QHS   guaiFENesin  600 mg Oral BID   heparin  5,000 Units Subcutaneous Q8H   insulin aspart  0-9 Units Subcutaneous TID WC   mirabegron ER  25 mg Oral Daily   pantoprazole  40 mg Oral BID   revefenacin  175 mcg Nebulization Daily   Continuous Infusions:  cefTRIAXone (ROCEPHIN)  IV 2 g (11/29/21 2230)   PRN Meds:.acetaminophen **OR**  acetaminophen, ipratropium-albuterol, nitroGLYCERIN   I have personally reviewed following labs and imaging studies  LABORATORY DATA: CBC: Recent Labs  Lab 11/28/21 2223 11/29/21 0244 11/30/21 0641  WBC 28.2* 33.0* 13.4*  NEUTROABS 25.7*  --   --   HGB 7.7* 7.5* 7.0*  HCT 27.9* 27.8* 26.1*  MCV 78.4* 79.7* 78.9*  PLT 341 310 303     Basic Metabolic Panel: Recent Labs  Lab 11/28/21 2223 11/29/21 0244 11/30/21 0641  NA 136 134* 137  K 4.9 4.7 4.7  CL 105 103 106  CO2 23 21* 22  GLUCOSE 268* 180* 104*  BUN 18 19 12   CREATININE 1.40* 1.24* 0.76  CALCIUM 8.7* 8.4* 8.6*     GFR: Estimated Creatinine Clearance: 67.7 mL/min (by C-G formula based on SCr of 0.76 mg/dL).  Liver Function Tests: Recent Labs  Lab 11/28/21 2223  AST 32  ALT 18  ALKPHOS 59  BILITOT 0.7  PROT 6.6  ALBUMIN 2.5*    No results for input(s): "LIPASE", "AMYLASE" in the last 168 hours. No results for input(s): "AMMONIA" in the last 168 hours.  Coagulation Profile: Recent Labs  Lab 11/28/21 2223  INR 1.3*     Cardiac Enzymes: No results for input(s): "CKTOTAL", "CKMB", "CKMBINDEX", "TROPONINI" in the last 168 hours.  BNP (last 3 results) No results for input(s): "PROBNP" in the last 8760 hours.  Lipid Profile: No results for input(s): "CHOL", "HDL", "LDLCALC", "TRIG", "CHOLHDL", "LDLDIRECT" in the last 72 hours.  Thyroid Function Tests: No results for input(s): "TSH", "T4TOTAL", "FREET4", "T3FREE", "THYROIDAB" in the last 72 hours.  Anemia Panel: No results for input(s): "VITAMINB12", "FOLATE", "FERRITIN", "TIBC", "IRON", "RETICCTPCT" in the last 72 hours.  Urine analysis:    Component Value Date/Time   COLORURINE YELLOW 11/29/2021 1200   APPEARANCEUR HAZY (A) 11/29/2021 1200   LABSPEC 1.020 11/29/2021 1200   PHURINE 6.0 11/29/2021 1200   GLUCOSEU NEGATIVE 11/29/2021 1200   HGBUR SMALL (A) 11/29/2021 1200   BILIRUBINUR NEGATIVE 11/29/2021 1200   KETONESUR NEGATIVE  11/29/2021 1200   PROTEINUR 100 (A) 11/29/2021 1200   UROBILINOGEN 0.2 04/03/2013 1106   NITRITE NEGATIVE 11/29/2021 1200   LEUKOCYTESUR NEGATIVE 11/29/2021 1200    Sepsis Labs: Lactic Acid, Venous    Component Value Date/Time   LATICACIDVEN 1.4 11/29/2021 0441    MICROBIOLOGY: Recent Results (from the past 240 hour(s))  Culture, blood (Routine x 2)     Status: Abnormal (Preliminary result)   Collection Time: 11/28/21 10:23 PM   Specimen: BLOOD  Result Value Ref Range Status   Specimen Description BLOOD RIGHT ANTECUBITAL  Final   Special Requests   Final    BOTTLES DRAWN AEROBIC AND ANAEROBIC Blood Culture adequate volume   Culture  Setup Time   Final    GRAM POSITIVE COCCI IN CHAINS AEROBIC BOTTLE ONLY CRITICAL RESULT CALLED TO, READ BACK BY AND VERIFIED WITH: PHARMD CARLA JARDIN ON 11/29/21 @ 1606 BY DRT    Culture (A)  Final    STREPTOCOCCUS PNEUMONIAE SUSCEPTIBILITIES TO FOLLOW Performed at [facility], [place] [address], [place], [place] [address]    Report Status PENDING  Incomplete  Blood Culture ID Panel (Reflexed)     Status: Abnormal   Collection Time: 11/28/21 10:23 PM  Result Value Ref Range Status   Enterococcus faecalis NOT DETECTED NOT DETECTED Final   Enterococcus Faecium NOT DETECTED NOT DETECTED Final   Listeria monocytogenes NOT DETECTED NOT DETECTED Final   Staphylococcus species NOT DETECTED NOT DETECTED Final   Staphylococcus aureus (BCID) NOT DETECTED NOT DETECTED Final   Staphylococcus epidermidis NOT DETECTED NOT DETECTED Final   Staphylococcus lugdunensis NOT DETECTED NOT DETECTED Final   Streptococcus species DETECTED (A) NOT DETECTED Final    Comment: CRITICAL RESULT CALLED TO, READ BACK BY AND VERIFIED WITH: PHARMD CARLA JARDIN ON 11/29/21 @ 1606 BY DRT    Streptococcus agalactiae NOT DETECTED NOT DETECTED Final   Streptococcus pneumoniae DETECTED (A) NOT DETECTED Final    Comment: CRITICAL RESULT CALLED TO, READ BACK  BY AND VERIFIED  WITH: PHARMD CARLA JARDIN ON 11/29/21 @ 1606 BY DRT    Streptococcus pyogenes NOT DETECTED NOT DETECTED Final   A.calcoaceticus-baumannii NOT DETECTED NOT DETECTED Final   Bacteroides fragilis NOT DETECTED NOT DETECTED Final   Enterobacterales NOT DETECTED NOT DETECTED Final   Enterobacter cloacae complex NOT DETECTED NOT DETECTED Final   Escherichia coli NOT DETECTED NOT DETECTED Final   Klebsiella aerogenes NOT DETECTED NOT DETECTED Final   Klebsiella oxytoca NOT DETECTED NOT DETECTED Final   Klebsiella pneumoniae NOT DETECTED NOT DETECTED Final   Proteus species NOT DETECTED NOT DETECTED Final   Salmonella species NOT DETECTED NOT DETECTED Final   Serratia marcescens NOT DETECTED NOT DETECTED Final   Haemophilus influenzae NOT DETECTED NOT DETECTED Final   Neisseria meningitidis NOT DETECTED NOT DETECTED Final   Pseudomonas aeruginosa NOT DETECTED NOT DETECTED Final   Stenotrophomonas maltophilia NOT DETECTED NOT DETECTED Final   Candida albicans NOT DETECTED NOT DETECTED Final   Candida auris NOT DETECTED NOT DETECTED Final   Candida glabrata NOT DETECTED NOT DETECTED Final   Candida krusei NOT DETECTED NOT DETECTED Final   Candida parapsilosis NOT DETECTED NOT DETECTED Final   Candida tropicalis NOT DETECTED NOT DETECTED Final   Cryptococcus neoformans/gattii NOT DETECTED NOT DETECTED Final    Comment: Performed at [facility], [address], [place], [place] [address]  Culture, blood (Routine x 2)     Status: None (Preliminary result)   Collection Time: 11/28/21 10:25 PM   Specimen: BLOOD RIGHT HAND  Result Value Ref Range Status   Specimen Description BLOOD RIGHT HAND  Final   Special Requests   Final    BOTTLES DRAWN AEROBIC AND ANAEROBIC Blood Culture results may not be optimal due to an excessive volume of blood received in culture bottles   Culture   Final    NO GROWTH 2 DAYS Performed at [facility], [place] [address], [place],  [address]    Report  Status PENDING  Incomplete  SARS Coronavirus 2 by RT PCR (hospital order, performed in [facility] hospital lab) *cepheid single result test* Anterior Nasal Swab     Status: None   Collection Time: 11/28/21 10:25 PM   Specimen: Anterior Nasal Swab  Result Value Ref Range Status   SARS Coronavirus 2 by RT PCR NEGATIVE NEGATIVE Final    Comment: (NOTE) SARS-CoV-2 target nucleic acids are NOT DETECTED.  The SARS-CoV-2 RNA is generally detectable in upper and lower respiratory specimens during the acute phase of infection. The lowest concentration of SARS-CoV-2 viral copies this assay can detect is 250 copies / mL. A negative result does not preclude SARS-CoV-2 infection and should not be used as the sole basis for treatment or other patient management decisions.  A negative result may occur with improper specimen collection / handling, submission of specimen other than nasopharyngeal swab, presence of viral mutation(s) within the areas targeted by this assay, and inadequate number of viral copies (<250 copies / mL). A negative result must be combined with clinical observations, patient history, and epidemiological information.  Fact Sheet for Patients:   https://www.patel.info/  Fact Sheet for Healthcare Providers: https://hall.com/  This test is not yet approved or  cleared by the [place] FDA and has been authorized for detection and/or diagnosis of SARS-CoV-2 by FDA under an Emergency Use Authorization (EUA).  This EUA will remain in effect (meaning this test can be used) for the duration of the COVID-19 declaration under  Section 564(b)(1) of the Act, 21 U.S.C. section 360bbb-3(b)(1), unless the authorization is terminated or revoked sooner.  Performed at [facility], [place] [address] [place], [place] [address]   Urine Culture     Status: None   Collection Time: 11/29/21  3:43 PM   Specimen: In/Out Cath Urine  Result Value  Ref Range Status   Specimen Description IN/OUT CATH URINE  Final   Special Requests NONE  Final   Culture   Final    NO GROWTH Performed at [facility], [place] [address] [place], [place] [address]    Report Status 11/30/2021 FINAL  Final    RADIOLOGY STUDIES/RESULTS: DG CHEST PORT 1 VIEW  Result Date: 11/28/2021 CLINICAL DATA:  Shortness of breath and cough. EXAM: PORTABLE CHEST 1 VIEW COMPARISON:  Chest x-ray 11/09/2021. FINDINGS: Heart is enlarged, unchanged. There are scattered patchy opacities in the left mid and lower lung. The costophrenic angles are clear. No pneumothorax. No acute fractures. IMPRESSION: 1. Patchy left lower lung opacities may represent atelectasis or infection. 2.  Mild cardiomegaly is unchanged. Electronically Signed   By: Ronney Asters M.D.   On: 11/28/2021 22:30     LOS: 1 day   Oren Binet, MD  [facility]    To contact the attending provider between 7A-7P or the covering provider during after hours 7P-7A, please log into the web site [email or web address] and access using universal [place] password for that web site. If you do not have the password, please call the hospital operator.  11/30/2021, 2:34 PM

## 2021-11-30 NOTE — Evaluation (Signed)
Physical Therapy Evaluation Patient Details Name: [name] MRN: [id number] DOB: June 16, 1951 Today's Date: 11/30/2021  History of Present Illness  71 yo female admitted 6/7 with SOB and sepsis due to PNA. PMHx: COPD on 4L, HFpEF, PAD, HTN, GIB, DM  Clinical Impression  Pt pleasant and lives at home with daughter and grandchildren on 3L at baseline but reports she only checks SpO2 3-4x /wk. Pt with decreased activity tolerance requiring 4L for mobility in addition to energy conservation and pursed lip breathing to maintain SPo2 >90%. PT with decreased activity tolerance and cardiopulmonary function who will benefit from acute therapy to maximize safety and independence.   HR 101-115 SPO2 99% on 3L at rest, drop to 84% with attempt at 2L sitting EOB, 4L with sats 91-95% with brief drop to 87% during gait which recovered within 5 sec of standing rest       Recommendations for follow up therapy are one component of a multi-disciplinary discharge planning process, led by the attending physician.  Recommendations may be updated based on patient status, additional functional criteria and insurance authorization.  Follow Up Recommendations Home health PT (active with HHPT)    Assistance Recommended at Discharge Intermittent Supervision/Assistance  Patient can return home with the following  Assistance with cooking/housework;Assist for transportation    Equipment Recommendations None recommended by PT  Recommendations for Other Services       Functional Status Assessment Patient has had a recent decline in their functional status and demonstrates the ability to make significant improvements in function in a reasonable and predictable amount of time.     Precautions / Restrictions Precautions Precaution Comments: monitor O2 (wears 3 L O2 at baseline)      Mobility  Bed Mobility Overal bed mobility: Modified Independent             General bed mobility comments: with rail  and HOB 10 degrees    Transfers Overall transfer level: Independent                      Ambulation/Gait Ambulation/Gait assistance: Supervision Gait Distance (Feet): 130 Feet Assistive device: None Gait Pattern/deviations: Step-through pattern, Decreased stride length   Gait velocity interpretation: 1.31 - 2.62 ft/sec, indicative of limited community ambulator   General Gait Details: pt with 2 standing rest breaks with cues for pursed lip breathing, activity tolerance and energy conservation. Activity limited by fatigue with SPO2 dropping to 87% during initial rest on 4L and required standing to recover to 93%  Stairs            Wheelchair Mobility    Modified Rankin (Stroke Patients Only)       Balance Overall balance assessment: No apparent balance deficits (not formally assessed)                                           Pertinent Vitals/Pain Pain Assessment Pain Assessment: No/denies pain    Home Living Family/patient expects to be discharged to:: Private residence Living Arrangements: Children Available Help at Discharge: Family;Available PRN/intermittently Type of Home: House Home Access: Stairs to enter Entrance Stairs-Rails: Left Entrance Stairs-Number of Steps: 5   Home Layout: One level Home Equipment: Rollator (4 wheels);Cane - single point      Prior Function Prior Level of Function : Independent/Modified Independent  Hand Dominance        Extremity/Trunk Assessment   Upper Extremity Assessment Upper Extremity Assessment: Overall WFL for tasks assessed    Lower Extremity Assessment Lower Extremity Assessment: Overall WFL for tasks assessed    Cervical / Trunk Assessment Cervical / Trunk Assessment: Normal  Communication   Communication: No difficulties  Cognition Arousal/Alertness: Awake/alert Behavior During Therapy: WFL for tasks assessed/performed Overall Cognitive Status:  Within Functional Limits for tasks assessed                                          General Comments      Exercises     Assessment/Plan    PT Assessment Patient needs continued PT services  PT Problem List Cardiopulmonary status limiting activity;Decreased activity tolerance;Decreased balance;Decreased mobility       PT Treatment Interventions DME instruction;Gait training;Stair training;Therapeutic activities;Functional mobility training;Therapeutic exercise;Balance training;Patient/family education    PT Goals (Current goals can be found in the Care Plan section)  Acute Rehab PT Goals Patient Stated Goal: to go home PT Goal Formulation: With patient Time For Goal Achievement: 12/14/21 Potential to Achieve Goals: Good    Frequency Min 3X/week     Co-evaluation               AM-PAC PT "6 Clicks" Mobility  Outcome Measure Help needed turning from your back to your side while in a flat bed without using bedrails?: None Help needed moving from lying on your back to sitting on the side of a flat bed without using bedrails?: None Help needed moving to and from a bed to a chair (including a wheelchair)?: None Help needed standing up from a chair using your arms (e.g., wheelchair or bedside chair)?: A Little Help needed to walk in hospital room?: A Little Help needed climbing 3-5 steps with a railing? : A Little 6 Click Score: 21    End of Session Equipment Utilized During Treatment: Oxygen Activity Tolerance: Patient tolerated treatment well Patient left: in chair;with call bell/phone within reach Nurse Communication: Mobility status PT Visit Diagnosis: Other abnormalities of gait and mobility (R26.89);Difficulty in walking, not elsewhere classified (R26.2)    Time: 5852-7782 PT Time Calculation (min) (ACUTE ONLY): 23 min   Charges:   PT Evaluation $PT Eval Moderate Complexity: 1 Mod          [place], PT Acute Rehabilitation  Services Office: [phone]   Sandy Salaam Lorriane Dehart 11/30/2021, 9:17 AM

## 2021-11-30 NOTE — TOC Initial Note (Addendum)
Transition of Care [facility]) - Initial/Assessment Note    Patient Details  Name: [name] MRN: [id number] Date of Birth: Jul 15, 1950  Transition of Care [facility]) CM/SW Contact:    Verdell Carmine, RN Phone Number: 11/30/2021, 3:29 PM  Clinical Narrative:                  Patient presented with [facility], on oxygen at home 3LPM with adapt. Patient also has [facility] with [facility]. She lives with her daughter and grandchildren.    Is accepting of continuing PT at home. PT has seen the paitent and continues to recommend this. Daughter Andee Poles had question regarding discharge planning. Called Danielle and left confidential voice message to return call to discuss. Permission to speak to daughter granted by patient.  [facility] messaged to confirm. Marjory Lies from [facility] called back Primary care MD refusing to sign plan of care/ orders for [facility] so patient only received 3 sessions.   Called PCP Osei- Bonsu  for discussion on Home Health. He and his nurse are unavailable at this time.    CM will follow for needs, recommendations, and transitions of care Expected Discharge Plan: [place] Barriers to Discharge: Continued Medical Work up   Patient Goals and CMS Choice Patient states their goals for this hospitalization and ongoing recovery are:: home   Choice offered to / list presented to : Patient  Expected Discharge Plan and Services Expected Discharge Plan: [place]   Discharge Planning Services: CM Consult                                          Prior Living Arrangements/Services   Lives with:: Self, Adult Children Patient language and need for interpreter reviewed:: Yes        Need for Family Participation in Patient Care: Yes (Comment) Care giver support system in place?: Yes (comment)   Criminal Activity/Legal Involvement Pertinent to Current Situation/Hospitalization: No - Comment as needed  Activities of Daily Living Home  Assistive Devices/Equipment: Cane (specify quad or straight), Walker (specify type) ADL Screening (condition at time of admission) Patient's cognitive ability adequate to safely complete daily activities?: Yes Is the patient deaf or have difficulty hearing?: No Does the patient have difficulty seeing, even when wearing glasses/contacts?: No Does the patient have difficulty concentrating, remembering, or making decisions?: No Patient able to express need for assistance with ADLs?: Yes Does the patient have difficulty dressing or bathing?: No Independently performs ADLs?: Yes (appropriate for developmental age) Communication: Independent Dressing (OT): Independent Does the patient have difficulty walking or climbing stairs?: Yes Weakness of Legs: Both Weakness of Arms/Hands: None  Permission Sought/Granted                  Emotional Assessment Appearance:: Appears stated age Attitude/Demeanor/Rapport: Engaged Affect (typically observed): Accepting Orientation: : Oriented to Self, Oriented to Place, Oriented to  Time, Oriented to Situation   Psych Involvement: No (comment)  Admission diagnosis:  CAP (community acquired pneumonia) [J18.9] Sepsis ([place]) [A41.9] Sepsis with acute renal failure without septic shock, due to unspecified organism, unspecified acute renal failure type ([place]) [A41.9, R65.20, N17.9] Patient Active Problem List   Diagnosis Date Noted   CAP (community acquired pneumonia) 11/29/2021   Sepsis ([place]) 11/29/2021   Acute bacterial bronchitis 11/08/2021   GI bleed 11/08/2021   Elevated troponin level not due myocardial  infarction 11/07/2021   Acute cardiogenic pulmonary edema ([place]) 11/07/2021   Chronic respiratory failure with hypoxia ([place]) 11/07/2021   Mixed diabetic hyperlipidemia associated with type 2 diabetes mellitus ([place]) 11/07/2021   [place]ena    Anemia due to chronic blood loss    AVM (arteriovenous malformation) of small bowel, acquired with hemorrhage     Acute on chronic blood loss anemia 09/21/2021   Thrombocytosis 09/21/2021   Type 2 diabetes mellitus without complication, without long-term current use of insulin ([place]) 09/21/2021   Class 3 obesity ([place]) 08/27/2021   Acute upper gastrointestinal bleeding 05/09/2021   COPD (chronic obstructive pulmonary disease) ([place])    Coronary artery disease involving native coronary artery of native heart without angina pectoris    Osteoarthritis of left knee 04/18/2020   Tobacco use disorder 09/18/2018   Iron deficiency anemia 04/09/2018   Chronic anemia 12/26/2015   PVD (peripheral vascular disease) ([place]) 10/05/2013   Discomfort in chest 05/11/2012   Nicotine dependence 05/11/2012   Bradycardia 05/11/2012   Essential hypertension 05/11/2012   Back pain 05/11/2012   PCP:  Benito Mccreedy, MD Pharmacy:   [facility] ) - [place], [address] [place] [address] Phone: [phone] Fax: [place], [place] - [address] AT [address] [place] [address] Phone: [phone] Fax: [phone]  [facility] [address] [place] [address] Phone: [phone] Fax: [phone]     Social Determinants of Health (SDOH) Interventions    Readmission Risk Interventions    08/28/2021   10:31 AM  Readmission Risk Prevention Plan  Transportation Screening Complete  PCP or Specialist Appt within 3-5 Days Complete  HRI or [place] Complete  Social Work Consult for [place] Planning/Counseling Complete  Palliative Care Screening Complete  Medication Review Press [employer]) Complete

## 2021-11-30 NOTE — Progress Notes (Signed)
Complaints of chest pain overnight. Patient states she gets it intermittently and has had it for multiple years. Normally takes sublingual nitro at home. Paged physician for possible PRN order. Due to admission for sepsis, 1 time IV morphine ordered and given. No complaints of pain post-med; able to rest.

## 2021-11-30 NOTE — Consult Note (Addendum)
   [facility] CM Inpatient Consult   11/30/2021  Stayce Delancy [birth year] [id number]  [place]  Accountable Care Organization [ACO] Patient: [place] [employer]  Alerted by [place] that patient in ED is being admitted last evening.  Patient with extreme high risk score with less than 30 days readmission noted  Primary Care Provider:  Benito Mccreedy, MD, [facility]  Patient is currently active with [facility] for chronic disease management services.  Patient has been engaged by a [place].  Our community based plan of care has focused on disease management and community resource support.   12:25 Met with the patient at the bedside, eating lunch.  She states she is feeling a little better but her concerns is that she would like for the doctors to address her concerns about two recent tick bites within the month and hoping they're addressing it with antibiotics. Encourage patient to mention this to the MD as it may have been addressed with the lab work already obtained. Patient request that this writer "just let the doctor know." Sent non urgent message to MD via secure chat.  She endorses follow up with [facility] RNCM and PCP. Patient given a reminder card for post hospital follow up appointment and 24 hour nurse advise line magnet.  SDOH reviewed for housing, food insecurity, and transportation, currently no needs assessed.    Plan: [facility] RNCM to follow for post hospital needs.   Of note, [facility] Care Management services does not replace or interfere with any services that are needed or arranged by inpatient [facility] care management team.  For additional questions or referrals please contact:

## 2021-12-01 DIAGNOSIS — E119 Type 2 diabetes mellitus without complications: Secondary | ICD-10-CM

## 2021-12-01 DIAGNOSIS — A419 Sepsis, unspecified organism: Secondary | ICD-10-CM | POA: Diagnosis not present

## 2021-12-01 DIAGNOSIS — J449 Chronic obstructive pulmonary disease, unspecified: Secondary | ICD-10-CM | POA: Diagnosis not present

## 2021-12-01 DIAGNOSIS — J189 Pneumonia, unspecified organism: Secondary | ICD-10-CM | POA: Diagnosis not present

## 2021-12-01 LAB — CULTURE, BLOOD (ROUTINE X 2): Special Requests: ADEQUATE

## 2021-12-01 LAB — GLUCOSE, CAPILLARY
Glucose-Capillary: 160 mg/dL — ABNORMAL HIGH (ref 70–99)
Glucose-Capillary: 100 mg/dL — ABNORMAL HIGH (ref 70–99)
Glucose-Capillary: 149 mg/dL — ABNORMAL HIGH (ref 70–99)
Glucose-Capillary: 176 mg/dL — ABNORMAL HIGH (ref 70–99)

## 2021-12-01 LAB — BASIC METABOLIC PANEL
Anion gap: 9 (ref 5–15)
BUN: 9 mg/dL (ref 8–23)
CO2: 22 mmol/L (ref 22–32)
Calcium: 9 mg/dL (ref 8.9–10.3)
Chloride: 104 mmol/L (ref 98–111)
Creatinine, Ser: 0.84 mg/dL (ref 0.44–1.00)
GFR, Estimated: >60 mL/min (ref >60)
Glucose, Bld: 140 mg/dL — ABNORMAL HIGH (ref 70–99)
Potassium: 4.4 mmol/L (ref 3.5–5.1)
Sodium: 135 mmol/L (ref 135–145)

## 2021-12-01 MED ORDER — FUROSEMIDE 40 MG PO TABS
40.0000 mg | ORAL_TABLET | Freq: Every day | ORAL | Status: DC
  Administered 2021-12-01 – 2021-12-04 (×4): 40 mg via ORAL
  Filled 2021-12-01 (×4): qty 1

## 2021-12-01 MED ORDER — HYDRALAZINE HCL 20 MG/ML IJ SOLN: 10.0000 mg | Freq: Four times a day (QID) | INTRAMUSCULAR | Status: DC | PRN

## 2021-12-01 MED ORDER — GUAIFENESIN-DM 100-10 MG/5ML PO SYRP
5.0000 mL | ORAL_SOLUTION | ORAL | Status: DC | PRN
  Administered 2021-12-01: 5 mL via ORAL
  Filled 2021-12-01: qty 5

## 2021-12-01 NOTE — Progress Notes (Signed)
PROGRESS NOTE        PATIENT DETAILS Name: [name] Age: 71 y.o. Sex: female Date of Birth: February 17, 1951 Admit Date: 11/28/2021 Admitting Physician Rise Patience, MD [id number], Iona Beard, MD  Brief Summary: Patient is a 71 y.o.  female with history of COPD on 4 L of oxygen at home, HFpEF, PAD, HTN, chronic GI bleeding due to AVMs-presented with fever, cough, shortness of breath-was found to have sepsis due to PNA and subsequently admitted to the hospitalist service.  Significant events: 6/07>> admit to [facility] for sepsis due to PNA  Significant studies: 5/18>> TTE: EF 76-22%, grade 1 diastolic dysfunction 6/33>> CXR: Left PNA  Significant microbiology data: 6/07>> COVID PCR: Negative 6/07>> blood culture: 1/2 strep pneumoniae 6/10>> blood culture: Pending  Procedures: None  Consults: None   Subjective: Comfortable-no major issues overnight.  Stable on 3 L of oxygen.  Feels weak but otherwise does not have any specific complaints.  Objective: Vitals: Blood pressure (!) 146/81, pulse 83, temperature 98.6 F (37 C), temperature source Oral, resp. rate 18, height 5' (1.524 m), weight 96.4 kg, SpO2 98 %.   Exam: Gen Exam:Alert awake-not in any distress HEENT:atraumatic, normocephalic Chest: Some scattered rhonchi. CVS:S1S2 regular Abdomen:soft non tender, non distended Extremities:no edema Neurology: Non focal Skin: no rash    Pertinent Labs/Radiology:    Latest Ref Rng & Units 11/30/2021    6:41 AM 11/29/2021    2:44 AM 11/28/2021   10:23 PM  CBC  WBC 4.0 - 10.5 K/uL 13.4  33.0  28.2   Hemoglobin 12.0 - 15.0 g/dL 7.0  7.5  7.7   Hematocrit 36.0 - 46.0 % 26.1  27.8  27.9   Platelets 150 - 400 K/uL 303  310  341     Lab Results  Component Value Date   NA 135 12/01/2021   K 4.4 12/01/2021   CL 104 12/01/2021   CO2 22 12/01/2021     Assessment/Plan: Sepsis due to Streptococcus pneumonia bacteremia and community-acquired  pneumonia: Sepsis physiology improving-continue Rocephin-repeat cultures today.  Continue to follow clinical course.  AKI: Likely hemodynamically mediated in the setting of sepsis/ARB/hypotension.  Creatinine now has normalized.  COPD: Not in exacerbation-continue bronchodilators.  No indication for steroids at this point.  Chronic hypoxic respiratory failure: On 3-4 L of oxygen at baseline.  Chronic GI bleeding with chronic blood loss anemia: In the setting of AVMs-reviewed prior GI notes-patient has been [facility] to [facility].  Follow CBC closely and transfuse if Hb<7  Chronic HFpEF: Volume status stable-now that BP has stabilized-resume home dose of furosemide.    CAD: No anginal symptoms.  Continue aspirin/statin  PAD-s/p right SFA PCI 2014: Continue aspirin/statin  HTN: BP stable-starting furosemide today-continue to hold losartan/metoprolol/Aldactone-we will resume slowly over the next few days.    DM-2 (A1c 4.9 on 3/21): Continue SSI and follow CBG trend.  Resume metformin on discharge.  Recent Labs    11/30/21 2206 12/01/21 0814 12/01/21 1215  GLUCAP 147* 160* 100*     Morbid Obesity: Estimated body mass index is 41.52 kg/m as calculated from the following:   Height as of this encounter: 5' (1.524 m).   Weight as of this encounter: 96.4 kg.   Code status:   Code Status: Full Code   DVT Prophylaxis: heparin injection 5,000 Units Start: 11/29/21 0600   Family Communication: [phone]  updated over the phone on 6/9   Disposition Plan: Status is: Inpatient Remains inpatient appropriate because: Sepsis due to PNA-not yet stable for discharge.   Planned Discharge Destination: Home health versus SNF   Diet: Diet Order             Diet heart healthy/carb modified Room service appropriate? Yes; Fluid consistency: Thin; Fluid restriction: 1200 mL Fluid  Diet effective now                     Antimicrobial agents: Anti-infectives (From  admission, onward)    Start     Dose/Rate Route Frequency Ordered Stop   11/29/21 2200  cefTRIAXone (ROCEPHIN) 2 g in sodium chloride 0.9 % 100 mL IVPB        2 g 200 mL/hr over 30 Minutes Intravenous Every 24 hours 11/29/21 1625     11/29/21 1100  ceFEPIme (MAXIPIME) 2 g in sodium chloride 0.9 % 100 mL IVPB  Status:  Discontinued        2 g 200 mL/hr over 30 Minutes Intravenous Every 12 hours 11/28/21 2328 11/29/21 1625   11/29/21 0100  azithromycin (ZITHROMAX) 500 mg in sodium chloride 0.9 % 250 mL IVPB  Status:  Discontinued        500 mg 250 mL/hr over 60 Minutes Intravenous Every 24 hours 11/29/21 0054 11/29/21 1625   11/28/21 2335  vancomycin variable dose per unstable renal function (pharmacist dosing)  Status:  Discontinued         Does not apply See admin instructions 11/28/21 2335 11/29/21 1625   11/28/21 2330  vancomycin (VANCOREADY) IVPB 2000 mg/400 mL        2,000 mg 200 mL/hr over 120 Minutes Intravenous  Once 11/28/21 2328 11/29/21 0222   11/28/21 2245  vancomycin (VANCOCIN) IVPB 1000 mg/200 mL premix  Status:  Discontinued        1,000 mg 200 mL/hr over 60 Minutes Intravenous  Once 11/28/21 2235 11/28/21 2328   11/28/21 2245  ceFEPIme (MAXIPIME) 2 g in sodium chloride 0.9 % 100 mL IVPB        2 g 200 mL/hr over 30 Minutes Intravenous  Once 11/28/21 2235 11/28/21 2336        MEDICATIONS: Scheduled Meds:  arformoterol  15 mcg Nebulization BID   aspirin EC  81 mg Oral Daily   atorvastatin  80 mg Oral Daily   budesonide (PULMICORT) nebulizer solution  0.25 mg Nebulization BID   ezetimibe  10 mg Oral Daily   ferrous sulfate  325 mg Oral Daily   gabapentin  600 mg Oral QHS   guaiFENesin  600 mg Oral BID   heparin  5,000 Units Subcutaneous Q8H   insulin aspart  0-9 Units Subcutaneous TID WC   mirabegron ER  25 mg Oral Daily   pantoprazole  40 mg Oral BID   revefenacin  175 mcg Nebulization Daily   Continuous Infusions:  cefTRIAXone (ROCEPHIN)  IV 2 g (11/30/21  2215)   PRN Meds:.acetaminophen **OR** acetaminophen, ipratropium-albuterol, nitroGLYCERIN   I have personally reviewed following labs and imaging studies  LABORATORY DATA: CBC: Recent Labs  Lab 11/28/21 2223 11/29/21 0244 11/30/21 0641  WBC 28.2* 33.0* 13.4*  NEUTROABS 25.7*  --   --   HGB 7.7* 7.5* 7.0*  HCT 27.9* 27.8* 26.1*  MCV 78.4* 79.7* 78.9*  PLT 341 310 303     Basic Metabolic Panel: Recent Labs  Lab 11/28/21 2223 11/29/21 0244 11/30/21 0641 12/01/21 0138  NA 136 134* 137 135  K 4.9 4.7 4.7 4.4  CL 105 103 106 104  CO2 23 21* 22 22  GLUCOSE 268* 180* 104* 140*  BUN 18 19 12 9   CREATININE 1.40* 1.24* 0.76 0.84  CALCIUM 8.7* 8.4* 8.6* 9.0     GFR: Estimated Creatinine Clearance: 63.9 mL/min (by C-G formula based on SCr of 0.84 mg/dL).  Liver Function Tests: Recent Labs  Lab 11/28/21 2223  AST 32  ALT 18  ALKPHOS 59  BILITOT 0.7  PROT 6.6  ALBUMIN 2.5*    No results for input(s): "LIPASE", "AMYLASE" in the last 168 hours. No results for input(s): "AMMONIA" in the last 168 hours.  Coagulation Profile: Recent Labs  Lab 11/28/21 2223  INR 1.3*     Cardiac Enzymes: No results for input(s): "CKTOTAL", "CKMB", "CKMBINDEX", "TROPONINI" in the last 168 hours.  BNP (last 3 results) No results for input(s): "PROBNP" in the last 8760 hours.  Lipid Profile: No results for input(s): "CHOL", "HDL", "LDLCALC", "TRIG", "CHOLHDL", "LDLDIRECT" in the last 72 hours.  Thyroid Function Tests: No results for input(s): "TSH", "T4TOTAL", "FREET4", "T3FREE", "THYROIDAB" in the last 72 hours.  Anemia Panel: No results for input(s): "VITAMINB12", "FOLATE", "FERRITIN", "TIBC", "IRON", "RETICCTPCT" in the last 72 hours.  Urine analysis:    Component Value Date/Time   COLORURINE YELLOW 11/29/2021 1200   APPEARANCEUR HAZY (A) 11/29/2021 1200   LABSPEC 1.020 11/29/2021 1200   PHURINE 6.0 11/29/2021 1200   GLUCOSEU NEGATIVE 11/29/2021 1200   HGBUR SMALL  (A) 11/29/2021 1200   BILIRUBINUR NEGATIVE 11/29/2021 1200   KETONESUR NEGATIVE 11/29/2021 1200   PROTEINUR 100 (A) 11/29/2021 1200   UROBILINOGEN 0.2 04/03/2013 1106   NITRITE NEGATIVE 11/29/2021 1200   LEUKOCYTESUR NEGATIVE 11/29/2021 1200    Sepsis Labs: Lactic Acid, Venous    Component Value Date/Time   LATICACIDVEN 1.4 11/29/2021 0441    MICROBIOLOGY: Recent Results (from the past 240 hour(s))  Culture, blood (Routine x 2)     Status: Abnormal   Collection Time: 11/28/21 10:23 PM   Specimen: BLOOD  Result Value Ref Range Status   Specimen Description BLOOD RIGHT ANTECUBITAL  Final   Special Requests   Final    BOTTLES DRAWN AEROBIC AND ANAEROBIC Blood Culture adequate volume   Culture  Setup Time   Final    GRAM POSITIVE COCCI IN CHAINS AEROBIC BOTTLE ONLY CRITICAL RESULT CALLED TO, READ BACK BY AND VERIFIED WITH: PHARMD CARLA JARDIN ON 11/29/21 @ 1606 BY DRT Performed at [facility], [place] [address] [place], [place] [address]    Culture STREPTOCOCCUS PNEUMONIAE (A)  Final   Report Status 12/01/2021 FINAL  Final   Organism ID, Bacteria STREPTOCOCCUS PNEUMONIAE  Final      Susceptibility   Streptococcus pneumoniae - MIC*    ERYTHROMYCIN <=0.12 SENSITIVE Sensitive     LEVOFLOXACIN 0.5 SENSITIVE Sensitive     VANCOMYCIN 0.5 SENSITIVE Sensitive     PENICILLIN (meningitis) <=0.06 SENSITIVE Sensitive     PENO - penicillin <=0.06      PENICILLIN (non-meningitis) <=0.06 SENSITIVE Sensitive     PENICILLIN (oral) <=0.06 SENSITIVE Sensitive     CEFTRIAXONE (non-meningitis) <=0.12 SENSITIVE Sensitive     CEFTRIAXONE (meningitis) <=0.12 SENSITIVE Sensitive     * STREPTOCOCCUS PNEUMONIAE  Blood Culture ID Panel (Reflexed)     Status: Abnormal   Collection Time: 11/28/21 10:23 PM  Result Value Ref Range Status   Enterococcus faecalis NOT DETECTED NOT DETECTED Final  Enterococcus Faecium NOT DETECTED NOT DETECTED Final   Listeria monocytogenes NOT DETECTED NOT DETECTED  Final   Staphylococcus species NOT DETECTED NOT DETECTED Final   Staphylococcus aureus (BCID) NOT DETECTED NOT DETECTED Final   Staphylococcus epidermidis NOT DETECTED NOT DETECTED Final   Staphylococcus lugdunensis NOT DETECTED NOT DETECTED Final   Streptococcus species DETECTED (A) NOT DETECTED Final    Comment: CRITICAL RESULT CALLED TO, READ BACK BY AND VERIFIED WITH: PHARMD CARLA JARDIN ON 11/29/21 @ 1606 BY DRT    Streptococcus agalactiae NOT DETECTED NOT DETECTED Final   Streptococcus pneumoniae DETECTED (A) NOT DETECTED Final    Comment: CRITICAL RESULT CALLED TO, READ BACK BY AND VERIFIED WITH: PHARMD CARLA JARDIN ON 11/29/21 @ 1606 BY DRT    Streptococcus pyogenes NOT DETECTED NOT DETECTED Final   A.calcoaceticus-baumannii NOT DETECTED NOT DETECTED Final   Bacteroides fragilis NOT DETECTED NOT DETECTED Final   Enterobacterales NOT DETECTED NOT DETECTED Final   Enterobacter cloacae complex NOT DETECTED NOT DETECTED Final   Escherichia coli NOT DETECTED NOT DETECTED Final   Klebsiella aerogenes NOT DETECTED NOT DETECTED Final   Klebsiella oxytoca NOT DETECTED NOT DETECTED Final   Klebsiella pneumoniae NOT DETECTED NOT DETECTED Final   Proteus species NOT DETECTED NOT DETECTED Final   Salmonella species NOT DETECTED NOT DETECTED Final   Serratia marcescens NOT DETECTED NOT DETECTED Final   Haemophilus influenzae NOT DETECTED NOT DETECTED Final   Neisseria meningitidis NOT DETECTED NOT DETECTED Final   Pseudomonas aeruginosa NOT DETECTED NOT DETECTED Final   Stenotrophomonas maltophilia NOT DETECTED NOT DETECTED Final   Candida albicans NOT DETECTED NOT DETECTED Final   Candida auris NOT DETECTED NOT DETECTED Final   Candida glabrata NOT DETECTED NOT DETECTED Final   Candida krusei NOT DETECTED NOT DETECTED Final   Candida parapsilosis NOT DETECTED NOT DETECTED Final   Candida tropicalis NOT DETECTED NOT DETECTED Final   Cryptococcus neoformans/gattii NOT DETECTED NOT DETECTED  Final    Comment: Performed at [facility], [place] [address] [place], [place] [address]  Culture, blood (Routine x 2)     Status: None (Preliminary result)   Collection Time: 11/28/21 10:25 PM   Specimen: BLOOD RIGHT HAND  Result Value Ref Range Status   Specimen Description BLOOD RIGHT HAND  Final   Special Requests   Final    BOTTLES DRAWN AEROBIC AND ANAEROBIC Blood Culture results may not be optimal due to an excessive volume of blood received in culture bottles   Culture   Final    NO GROWTH 3 DAYS Performed at [facility], [place] [address] [place], [place] [address]    Report Status PENDING  Incomplete  SARS Coronavirus 2 by RT PCR (hospital order, performed in [facility] hospital lab) *cepheid single result test* Anterior Nasal Swab     Status: None   Collection Time: 11/28/21 10:25 PM   Specimen: Anterior Nasal Swab  Result Value Ref Range Status   SARS Coronavirus 2 by RT PCR NEGATIVE NEGATIVE Final    Comment: (NOTE) SARS-CoV-2 target nucleic acids are NOT DETECTED.  The SARS-CoV-2 RNA is generally detectable in upper and lower respiratory specimens during the acute phase of infection. The lowest concentration of SARS-CoV-2 viral copies this assay can detect is 250 copies / mL. A negative result does not preclude SARS-CoV-2 infection and should not be used as the sole basis for treatment or other patient management decisions.  A negative result may occur with improper specimen  collection / handling, submission of specimen other than nasopharyngeal swab, presence of viral mutation(s) within the areas targeted by this assay, and inadequate number of viral copies (<250 copies / mL). A negative result must be combined with clinical observations, patient history, and epidemiological information.  Fact Sheet for Patients:   https://www.patel.info/  Fact Sheet for Healthcare Providers: https://hall.com/  This test is  not yet approved or  cleared by the [place] FDA and has been authorized for detection and/or diagnosis of SARS-CoV-2 by FDA under an Emergency Use Authorization (EUA).  This EUA will remain in effect (meaning this test can be used) for the duration of the COVID-19 declaration under Section 564(b)(1) of the Act, 21 U.S.C. section 360bbb-3(b)(1), unless the authorization is terminated or revoked sooner.  Performed at [facility], [place] [address] [place], [place] [address]   Resp Panel by RT-PCR (Flu A&B, Covid) Anterior Nasal Swab     Status: None   Collection Time: 11/28/21 10:34 PM   Specimen: Anterior Nasal Swab  Result Value Ref Range Status   SARS Coronavirus 2 by RT PCR NEGATIVE NEGATIVE Final    Comment: (NOTE) SARS-CoV-2 target nucleic acids are NOT DETECTED.  The SARS-CoV-2 RNA is generally detectable in upper respiratory specimens during the acute phase of infection. The lowest concentration of SARS-CoV-2 viral copies this assay can detect is 138 copies/mL. A negative result does not preclude SARS-Cov-2 infection and should not be used as the sole basis for treatment or other patient management decisions. A negative result may occur with  improper specimen collection/handling, submission of specimen other than nasopharyngeal swab, presence of viral mutation(s) within the areas targeted by this assay, and inadequate number of viral copies(<138 copies/mL). A negative result must be combined with clinical observations, patient history, and epidemiological information. The expected result is Negative.  Fact Sheet for Patients:  EntrepreneurPulse.com.au  Fact Sheet for Healthcare Providers:  IncredibleEmployment.be  This test is no t yet approved or cleared by the [place] FDA and  has been authorized for detection and/or diagnosis of SARS-CoV-2 by FDA under an Emergency Use Authorization (EUA). This EUA will remain  in  effect (meaning this test can be used) for the duration of the COVID-19 declaration under Section 564(b)(1) of the Act, 21 U.S.C.section 360bbb-3(b)(1), unless the authorization is terminated  or revoked sooner.       Influenza A by PCR NEGATIVE NEGATIVE Final   Influenza B by PCR NEGATIVE NEGATIVE Final    Comment: (NOTE) The Xpert Xpress SARS-CoV-2/FLU/RSV plus assay is intended as an aid in the diagnosis of influenza from Nasopharyngeal swab specimens and should not be used as a sole basis for treatment. Nasal washings and aspirates are unacceptable for Xpert Xpress SARS-CoV-2/FLU/RSV testing.  Fact Sheet for Patients: EntrepreneurPulse.com.au  Fact Sheet for Healthcare Providers: IncredibleEmployment.be  This test is not yet approved or cleared by the [place] FDA and has been authorized for detection and/or diagnosis of SARS-CoV-2 by FDA under an Emergency Use Authorization (EUA). This EUA will remain in effect (meaning this test can be used) for the duration of the COVID-19 declaration under Section 564(b)(1) of the Act, 21 U.S.C. section 360bbb-3(b)(1), unless the authorization is terminated or revoked.  Performed at [facility], [place] [address], [place],  [address]   Urine Culture     Status: None   Collection Time: 11/29/21  3:43 PM   Specimen: In/Out Cath Urine  Result Value Ref Range Status   Specimen  Description IN/OUT CATH URINE  Final   Special Requests NONE  Final   Culture   Final    NO GROWTH Performed at [facility], [place] [address] [place], [place] [address]    Report Status 11/30/2021 FINAL  Final    RADIOLOGY STUDIES/RESULTS: No results found.   LOS: 2 days   Oren Binet, MD  [facility]    To contact the attending provider between 7A-7P or the covering provider during after hours 7P-7A, please log into the web site [email or web address] and access using universal   password for that web site. If you do not have the password, please call the hospital operator.  12/01/2021, 1:40 PM

## 2021-12-02 DIAGNOSIS — J449 Chronic obstructive pulmonary disease, unspecified: Secondary | ICD-10-CM | POA: Diagnosis not present

## 2021-12-02 DIAGNOSIS — A419 Sepsis, unspecified organism: Secondary | ICD-10-CM | POA: Diagnosis not present

## 2021-12-02 DIAGNOSIS — J9611 Chronic respiratory failure with hypoxia: Secondary | ICD-10-CM | POA: Diagnosis not present

## 2021-12-02 DIAGNOSIS — J189 Pneumonia, unspecified organism: Secondary | ICD-10-CM | POA: Diagnosis not present

## 2021-12-02 LAB — BASIC METABOLIC PANEL
Anion gap: 8 (ref 5–15)
BUN: 7 mg/dL — ABNORMAL LOW (ref 8–23)
CO2: 25 mmol/L (ref 22–32)
Calcium: 8.4 mg/dL — ABNORMAL LOW (ref 8.9–10.3)
Chloride: 103 mmol/L (ref 98–111)
Creatinine, Ser: 0.75 mg/dL (ref 0.44–1.00)
GFR, Estimated: >60 mL/min (ref >60)
Glucose, Bld: 127 mg/dL — ABNORMAL HIGH (ref 70–99)
Potassium: 4.1 mmol/L (ref 3.5–5.1)
Sodium: 136 mmol/L (ref 135–145)

## 2021-12-02 LAB — GLUCOSE, CAPILLARY
Glucose-Capillary: 219 mg/dL — ABNORMAL HIGH (ref 70–99)
Glucose-Capillary: 112 mg/dL — ABNORMAL HIGH (ref 70–99)
Glucose-Capillary: 127 mg/dL — ABNORMAL HIGH (ref 70–99)
Glucose-Capillary: 107 mg/dL — ABNORMAL HIGH (ref 70–99)

## 2021-12-02 LAB — CBC
HCT: 27.1 % — ABNORMAL LOW (ref 36.0–46.0)
Hemoglobin: 7.3 g/dL — ABNORMAL LOW (ref 12.0–15.0)
MCH: 21 pg — ABNORMAL LOW (ref 26.0–34.0)
MCHC: 26.9 g/dL — ABNORMAL LOW (ref 30.0–36.0)
MCV: 77.9 fL — ABNORMAL LOW (ref 80.0–100.0)
Platelets: 430 10*3/uL — ABNORMAL HIGH (ref 150–400)
RBC: 3.48 MIL/uL — ABNORMAL LOW (ref 3.87–5.11)
RDW: 20.6 % — ABNORMAL HIGH (ref 11.5–15.5)
WBC: 11.8 10*3/uL — ABNORMAL HIGH (ref 4.0–10.5)
nRBC: 0.4 % — ABNORMAL HIGH (ref 0.0–0.2)

## 2021-12-02 MED ORDER — METOPROLOL TARTRATE 25 MG PO TABS
25.0000 mg | ORAL_TABLET | Freq: Two times a day (BID) | ORAL | Status: DC
  Administered 2021-12-02 – 2021-12-04 (×5): 25 mg via ORAL
  Filled 2021-12-02 (×5): qty 1

## 2021-12-02 MED ORDER — BENZONATATE 100 MG PO CAPS
200.0000 mg | ORAL_CAPSULE | Freq: Three times a day (TID) | ORAL | Status: DC | PRN
  Administered 2021-12-02 – 2021-12-03 (×4): 200 mg via ORAL
  Filled 2021-12-02 (×4): qty 2

## 2021-12-02 NOTE — Progress Notes (Signed)
RN notified of patient having frequent PACs and PVCs, also run of MAT.  Dr Sloan Leiter notified, stated he would take a look at tele. No new orders received.

## 2021-12-02 NOTE — Progress Notes (Signed)
PROGRESS NOTE        PATIENT DETAILS Name: [name] Age: 71 y.o. Sex: female Date of Birth: 1950-10-05 Admit Date: 11/28/2021 Admitting Physician Rise Patience, MD GMW:[id number], Iona Beard, MD  Brief Summary: Patient is a 71 y.o.  female with history of COPD on 4 L of oxygen at home, HFpEF, PAD, HTN, chronic GI bleeding due to AVMs-presented with fever, cough, shortness of breath-was found to have sepsis due to PNA and subsequently admitted to the hospitalist service.  Significant events: 6/07>> admit to [facility] for sepsis due to PNA  Significant studies: 5/18>> TTE: EF 44-03%, grade 1 diastolic dysfunction 4/74>> CXR: Left PNA  Significant microbiology data: 6/07>> COVID PCR: Negative 6/07>> blood culture: 1/2 strep pneumoniae 6/10>> blood culture: Pending  Procedures: None  Consults: None   Subjective: Complains of weakness-but otherwise appears comfortable.  Inquiring about SNF.  Objective: Vitals: Blood pressure 137/67, pulse 89, temperature 99.3 F (37.4 C), temperature source Oral, resp. rate 20, height 5' (1.524 m), weight 97.9 kg, SpO2 96 %.   Exam: Gen Exam:Alert awake-not in any distress HEENT:atraumatic, normocephalic Chest: B/L clear to auscultation anteriorly CVS:S1S2 regular Abdomen:soft non tender, non distended Extremities:no edema Neurology: Non focal Skin: no rash    Pertinent Labs/Radiology:    Latest Ref Rng & Units 12/02/2021   12:30 AM 11/30/2021    6:41 AM 11/29/2021    2:44 AM  CBC  WBC 4.0 - 10.5 K/uL 11.8  13.4  33.0   Hemoglobin 12.0 - 15.0 g/dL 7.3  7.0  7.5   Hematocrit 36.0 - 46.0 % 27.1  26.1  27.8   Platelets 150 - 400 K/uL 430  303  310     Lab Results  Component Value Date   NA 136 12/02/2021   K 4.1 12/02/2021   CL 103 12/02/2021   CO2 25 12/02/2021     Assessment/Plan: Sepsis due to Streptococcus pneumonia bacteremia and community-acquired pneumonia: Sepsis physiology  improving-continue Rocephin-await repeat cultures.    AKI: Likely hemodynamically mediated in the setting of sepsis/ARB/hypotension.  Creatinine now has normalized.  COPD: Not in exacerbation-continue bronchodilators.  No indication for steroids at this point.  Chronic hypoxic respiratory failure: On 3-4 L of oxygen at baseline.  Chronic GI bleeding with chronic blood loss anemia: In the setting of AVMs-reviewed prior GI notes-patient has been [facility] to [facility].  Follow CBC closely and transfuse if Hb<7  Chronic HFpEF: Volume status stable-continue Lasix.  CAD: No anginal symptoms.  Continue aspirin/statin  PAD-s/p right SFA PCI 2014: Continue aspirin/statin  HTN: BP stable-resume metoprolol today.  Resume losartan/Aldactone over the next few days.   MAT/atrial tachycardia: Resuming metoprolol today-continue telemetry monitoring.  DM-2 (A1c 4.9 on 3/21): Continue SSI and follow CBG trend.  Resume metformin on discharge.  Recent Labs    12/01/21 1620 12/01/21 2148 12/02/21 0801  GLUCAP 149* 176* 219*   Debility/deconditioning: Complains of ongoing weakness-inquiring about SNF-we will have physical therapy reevaluate patient to see if home health is still appropriate.  She may need SNF.  Morbid Obesity: Estimated body mass index is 42.15 kg/m as calculated from the following:   Height as of this encounter: 5' (1.524 m).   Weight as of this encounter: 97.9 kg.   Code status:   Code Status: Full Code   DVT Prophylaxis: heparin injection 5,000 Units Start: 11/29/21 0600  Family Communication: Daughter-Danielle-[phone] updated over the phone on 6/11   Disposition Plan: Status is: Inpatient Remains inpatient appropriate because: Sepsis due to PNA-not yet stable for discharge.   Planned Discharge Destination: Home health versus SNF   Diet: Diet Order             Diet heart healthy/carb modified Room service appropriate? Yes; Fluid consistency: Thin; Fluid  restriction: 1200 mL Fluid  Diet effective now                     Antimicrobial agents: Anti-infectives (From admission, onward)    Start     Dose/Rate Route Frequency Ordered Stop   11/29/21 2200  cefTRIAXone (ROCEPHIN) 2 g in sodium chloride 0.9 % 100 mL IVPB        2 g 200 mL/hr over 30 Minutes Intravenous Every 24 hours 11/29/21 1625     11/29/21 1100  ceFEPIme (MAXIPIME) 2 g in sodium chloride 0.9 % 100 mL IVPB  Status:  Discontinued        2 g 200 mL/hr over 30 Minutes Intravenous Every 12 hours 11/28/21 2328 11/29/21 1625   11/29/21 0100  azithromycin (ZITHROMAX) 500 mg in sodium chloride 0.9 % 250 mL IVPB  Status:  Discontinued        500 mg 250 mL/hr over 60 Minutes Intravenous Every 24 hours 11/29/21 0054 11/29/21 1625   11/28/21 2335  vancomycin variable dose per unstable renal function (pharmacist dosing)  Status:  Discontinued         Does not apply See admin instructions 11/28/21 2335 11/29/21 1625   11/28/21 2330  vancomycin (VANCOREADY) IVPB 2000 mg/400 mL        2,000 mg 200 mL/hr over 120 Minutes Intravenous  Once 11/28/21 2328 11/29/21 0222   11/28/21 2245  vancomycin (VANCOCIN) IVPB 1000 mg/200 mL premix  Status:  Discontinued        1,000 mg 200 mL/hr over 60 Minutes Intravenous  Once 11/28/21 2235 11/28/21 2328   11/28/21 2245  ceFEPIme (MAXIPIME) 2 g in sodium chloride 0.9 % 100 mL IVPB        2 g 200 mL/hr over 30 Minutes Intravenous  Once 11/28/21 2235 11/28/21 2336        MEDICATIONS: Scheduled Meds:  arformoterol  15 mcg Nebulization BID   aspirin EC  81 mg Oral Daily   atorvastatin  80 mg Oral Daily   budesonide (PULMICORT) nebulizer solution  0.25 mg Nebulization BID   ezetimibe  10 mg Oral Daily   ferrous sulfate  325 mg Oral Daily   furosemide  40 mg Oral Daily   gabapentin  600 mg Oral QHS   guaiFENesin  600 mg Oral BID   heparin  5,000 Units Subcutaneous Q8H   insulin aspart  0-9 Units Subcutaneous TID WC   metoprolol tartrate  25  mg Oral BID   mirabegron ER  25 mg Oral Daily   pantoprazole  40 mg Oral BID   revefenacin  175 mcg Nebulization Daily   Continuous Infusions:  cefTRIAXone (ROCEPHIN)  IV 2 g (12/01/21 2229)   PRN Meds:.acetaminophen **OR** acetaminophen, benzonatate, guaiFENesin-dextromethorphan, hydrALAZINE, ipratropium-albuterol, nitroGLYCERIN   I have personally reviewed following labs and imaging studies  LABORATORY DATA: CBC: Recent Labs  Lab 11/28/21 2223 11/29/21 0244 11/30/21 0641 12/02/21 0030  WBC 28.2* 33.0* 13.4* 11.8*  NEUTROABS 25.7*  --   --   --   HGB 7.7* 7.5* 7.0* 7.3*  HCT 27.9* 27.8*  26.1* 27.1*  MCV 78.4* 79.7* 78.9* 77.9*  PLT 341 310 303 430*    Basic Metabolic Panel: Recent Labs  Lab 11/28/21 2223 11/29/21 0244 11/30/21 0641 12/01/21 0138 12/02/21 0030  NA 136 134* 137 135 136  K 4.9 4.7 4.7 4.4 4.1  CL 105 103 106 104 103  CO2 23 21* 22 22 25   GLUCOSE 268* 180* 104* 140* 127*  BUN 18 19 12 9  7*  CREATININE 1.40* 1.24* 0.76 0.84 0.75  CALCIUM 8.7* 8.4* 8.6* 9.0 8.4*    GFR: Estimated Creatinine Clearance: 67.7 mL/min (by C-G formula based on SCr of 0.75 mg/dL).  Liver Function Tests: Recent Labs  Lab 11/28/21 2223  AST 32  ALT 18  ALKPHOS 59  BILITOT 0.7  PROT 6.6  ALBUMIN 2.5*   No results for input(s): "LIPASE", "AMYLASE" in the last 168 hours. No results for input(s): "AMMONIA" in the last 168 hours.  Coagulation Profile: Recent Labs  Lab 11/28/21 2223  INR 1.3*    Cardiac Enzymes: No results for input(s): "CKTOTAL", "CKMB", "CKMBINDEX", "TROPONINI" in the last 168 hours.  BNP (last 3 results) No results for input(s): "PROBNP" in the last 8760 hours.  Lipid Profile: No results for input(s): "CHOL", "HDL", "LDLCALC", "TRIG", "CHOLHDL", "LDLDIRECT" in the last 72 hours.  Thyroid Function Tests: No results for input(s): "TSH", "T4TOTAL", "FREET4", "T3FREE", "THYROIDAB" in the last 72 hours.  Anemia Panel: No results for  input(s): "VITAMINB12", "FOLATE", "FERRITIN", "TIBC", "IRON", "RETICCTPCT" in the last 72 hours.  Urine analysis:    Component Value Date/Time   COLORURINE YELLOW 11/29/2021 1200   APPEARANCEUR HAZY (A) 11/29/2021 1200   LABSPEC 1.020 11/29/2021 1200   PHURINE 6.0 11/29/2021 1200   GLUCOSEU NEGATIVE 11/29/2021 1200   HGBUR SMALL (A) 11/29/2021 1200   BILIRUBINUR NEGATIVE 11/29/2021 1200   KETONESUR NEGATIVE 11/29/2021 1200   PROTEINUR 100 (A) 11/29/2021 1200   UROBILINOGEN 0.2 04/03/2013 1106   NITRITE NEGATIVE 11/29/2021 1200   LEUKOCYTESUR NEGATIVE 11/29/2021 1200    Sepsis Labs: Lactic Acid, Venous    Component Value Date/Time   LATICACIDVEN 1.4 11/29/2021 0441    MICROBIOLOGY: Recent Results (from the past 240 hour(s))  Culture, blood (Routine x 2)     Status: Abnormal   Collection Time: 11/28/21 10:23 PM   Specimen: BLOOD  Result Value Ref Range Status   Specimen Description BLOOD RIGHT ANTECUBITAL  Final   Special Requests   Final    BOTTLES DRAWN AEROBIC AND ANAEROBIC Blood Culture adequate volume   Culture  Setup Time   Final    GRAM POSITIVE COCCI IN CHAINS AEROBIC BOTTLE ONLY CRITICAL RESULT CALLED TO, READ BACK BY AND VERIFIED WITH: PHARMD CARLA JARDIN ON 11/29/21 @ 1606 BY DRT Performed at [facility], [place] [address], [place], [place] [address]    Culture STREPTOCOCCUS PNEUMONIAE (A)  Final   Report Status 12/01/2021 FINAL  Final   Organism ID, Bacteria STREPTOCOCCUS PNEUMONIAE  Final      Susceptibility   Streptococcus pneumoniae - MIC*    ERYTHROMYCIN <=0.12 SENSITIVE Sensitive     LEVOFLOXACIN 0.5 SENSITIVE Sensitive     VANCOMYCIN 0.5 SENSITIVE Sensitive     PENICILLIN (meningitis) <=0.06 SENSITIVE Sensitive     PENO - penicillin <=0.06      PENICILLIN (non-meningitis) <=0.06 SENSITIVE Sensitive     PENICILLIN (oral) <=0.06 SENSITIVE Sensitive     CEFTRIAXONE (non-meningitis) <=0.12 SENSITIVE Sensitive     CEFTRIAXONE (meningitis) <=0.12  SENSITIVE Sensitive     *  STREPTOCOCCUS PNEUMONIAE  Blood Culture ID Panel (Reflexed)     Status: Abnormal   Collection Time: 11/28/21 10:23 PM  Result Value Ref Range Status   Enterococcus faecalis NOT DETECTED NOT DETECTED Final   Enterococcus Faecium NOT DETECTED NOT DETECTED Final   Listeria monocytogenes NOT DETECTED NOT DETECTED Final   Staphylococcus species NOT DETECTED NOT DETECTED Final   Staphylococcus aureus (BCID) NOT DETECTED NOT DETECTED Final   Staphylococcus epidermidis NOT DETECTED NOT DETECTED Final   Staphylococcus lugdunensis NOT DETECTED NOT DETECTED Final   Streptococcus species DETECTED (A) NOT DETECTED Final    Comment: CRITICAL RESULT CALLED TO, READ BACK BY AND VERIFIED WITH: PHARMD CARLA JARDIN ON 11/29/21 @ 1606 BY DRT    Streptococcus agalactiae NOT DETECTED NOT DETECTED Final   Streptococcus pneumoniae DETECTED (A) NOT DETECTED Final    Comment: CRITICAL RESULT CALLED TO, READ BACK BY AND VERIFIED WITH: PHARMD CARLA JARDIN ON 11/29/21 @ 1606 BY DRT    Streptococcus pyogenes NOT DETECTED NOT DETECTED Final   A.calcoaceticus-baumannii NOT DETECTED NOT DETECTED Final   Bacteroides fragilis NOT DETECTED NOT DETECTED Final   Enterobacterales NOT DETECTED NOT DETECTED Final   Enterobacter cloacae complex NOT DETECTED NOT DETECTED Final   Escherichia coli NOT DETECTED NOT DETECTED Final   Klebsiella aerogenes NOT DETECTED NOT DETECTED Final   Klebsiella oxytoca NOT DETECTED NOT DETECTED Final   Klebsiella pneumoniae NOT DETECTED NOT DETECTED Final   Proteus species NOT DETECTED NOT DETECTED Final   Salmonella species NOT DETECTED NOT DETECTED Final   Serratia marcescens NOT DETECTED NOT DETECTED Final   Haemophilus influenzae NOT DETECTED NOT DETECTED Final   Neisseria meningitidis NOT DETECTED NOT DETECTED Final   Pseudomonas aeruginosa NOT DETECTED NOT DETECTED Final   Stenotrophomonas maltophilia NOT DETECTED NOT DETECTED Final   Candida albicans NOT  DETECTED NOT DETECTED Final   Candida auris NOT DETECTED NOT DETECTED Final   Candida glabrata NOT DETECTED NOT DETECTED Final   Candida krusei NOT DETECTED NOT DETECTED Final   Candida parapsilosis NOT DETECTED NOT DETECTED Final   Candida tropicalis NOT DETECTED NOT DETECTED Final   Cryptococcus neoformans/gattii NOT DETECTED NOT DETECTED Final    Comment: Performed at [facility], [place]. [address], [place], [place] [address]  Culture, blood (Routine x 2)     Status: None (Preliminary result)   Collection Time: 11/28/21 10:25 PM   Specimen: BLOOD RIGHT HAND  Result Value Ref Range Status   Specimen Description BLOOD RIGHT HAND  Final   Special Requests   Final    BOTTLES DRAWN AEROBIC AND ANAEROBIC Blood Culture results may not be optimal due to an excessive volume of blood received in culture bottles   Culture   Final    NO GROWTH 3 DAYS Performed at [facility], [place] [address], [place], [place] [address]    Report Status PENDING  Incomplete  SARS Coronavirus 2 by RT PCR (hospital order, performed in [facility] hospital lab) *cepheid single result test* Anterior Nasal Swab     Status: None   Collection Time: 11/28/21 10:25 PM   Specimen: Anterior Nasal Swab  Result Value Ref Range Status   SARS Coronavirus 2 by RT PCR NEGATIVE NEGATIVE Final    Comment: (NOTE) SARS-CoV-2 target nucleic acids are NOT DETECTED.  The SARS-CoV-2 RNA is generally detectable in upper and lower respiratory specimens during the acute phase of infection. The lowest concentration of SARS-CoV-2 viral copies this assay can detect is  250 copies / mL. A negative result does not preclude SARS-CoV-2 infection and should not be used as the sole basis for treatment or other patient management decisions.  A negative result may occur with improper specimen collection / handling, submission of specimen other than nasopharyngeal swab, presence of viral mutation(s) within the areas targeted by this  assay, and inadequate number of viral copies (<250 copies / mL). A negative result must be combined with clinical observations, patient history, and epidemiological information.  Fact Sheet for Patients:   https://www.patel.info/  Fact Sheet for Healthcare Providers: https://hall.com/  This test is not yet approved or  cleared by the [place] FDA and has been authorized for detection and/or diagnosis of SARS-CoV-2 by FDA under an Emergency Use Authorization (EUA).  This EUA will remain in effect (meaning this test can be used) for the duration of the COVID-19 declaration under Section 564(b)(1) of the Act, 21 U.S.C. section 360bbb-3(b)(1), unless the authorization is terminated or revoked sooner.  Performed at [facility], [place] [address] [place], [place] [address]   Resp Panel by RT-PCR (Flu A&B, Covid) Anterior Nasal Swab     Status: None   Collection Time: 11/28/21 10:34 PM   Specimen: Anterior Nasal Swab  Result Value Ref Range Status   SARS Coronavirus 2 by RT PCR NEGATIVE NEGATIVE Final    Comment: (NOTE) SARS-CoV-2 target nucleic acids are NOT DETECTED.  The SARS-CoV-2 RNA is generally detectable in upper respiratory specimens during the acute phase of infection. The lowest concentration of SARS-CoV-2 viral copies this assay can detect is 138 copies/mL. A negative result does not preclude SARS-Cov-2 infection and should not be used as the sole basis for treatment or other patient management decisions. A negative result may occur with  improper specimen collection/handling, submission of specimen other than nasopharyngeal swab, presence of viral mutation(s) within the areas targeted by this assay, and inadequate number of viral copies(<138 copies/mL). A negative result must be combined with clinical observations, patient history, and epidemiological information. The expected result is Negative.  Fact Sheet for  Patients:  EntrepreneurPulse.com.au  Fact Sheet for Healthcare Providers:  IncredibleEmployment.be  This test is no t yet approved or cleared by the [place] FDA and  has been authorized for detection and/or diagnosis of SARS-CoV-2 by FDA under an Emergency Use Authorization (EUA). This EUA will remain  in effect (meaning this test can be used) for the duration of the COVID-19 declaration under Section 564(b)(1) of the Act, 21 U.S.C.section 360bbb-3(b)(1), unless the authorization is terminated  or revoked sooner.       Influenza A by PCR NEGATIVE NEGATIVE Final   Influenza B by PCR NEGATIVE NEGATIVE Final    Comment: (NOTE) The Xpert Xpress SARS-CoV-2/FLU/RSV plus assay is intended as an aid in the diagnosis of influenza from Nasopharyngeal swab specimens and should not be used as a sole basis for treatment. Nasal washings and aspirates are unacceptable for Xpert Xpress SARS-CoV-2/FLU/RSV testing.  Fact Sheet for Patients: EntrepreneurPulse.com.au  Fact Sheet for Healthcare Providers: IncredibleEmployment.be  This test is not yet approved or cleared by the [place] FDA and has been authorized for detection and/or diagnosis of SARS-CoV-2 by FDA under an Emergency Use Authorization (EUA). This EUA will remain in effect (meaning this test can be used) for the duration of the COVID-19 declaration under Section 564(b)(1) of the Act, 21 U.S.C. section 360bbb-3(b)(1), unless the authorization is terminated or revoked.  Performed at [facility], [place]  [address], [place], [place] [address]   Urine Culture     Status: None   Collection Time: 11/29/21  3:43 PM   Specimen: In/Out Cath Urine  Result Value Ref Range Status   Specimen Description IN/OUT CATH URINE  Final   Special Requests NONE  Final   Culture   Final    NO GROWTH Performed at [facility], [place] [address]  [place], [place] [address]    Report Status 11/30/2021 FINAL  Final    RADIOLOGY STUDIES/RESULTS: No results found.   LOS: 3 days   Oren Binet, MD  [facility]    To contact the attending provider between 7A-7P or the covering provider during after hours 7P-7A, please log into the web site [email or web address] and access using universal [place] password for that web site. If you do not have the password, please call the hospital operator.  12/02/2021, 11:18 AM

## 2021-12-03 ENCOUNTER — Other Ambulatory Visit: Payer: Self-pay

## 2021-12-03 ENCOUNTER — Inpatient Hospital Stay (HOSPITAL_COMMUNITY): Payer: Medicare Other

## 2021-12-03 DIAGNOSIS — N179 Acute kidney failure, unspecified: Secondary | ICD-10-CM

## 2021-12-03 DIAGNOSIS — R7881 Bacteremia: Secondary | ICD-10-CM

## 2021-12-03 DIAGNOSIS — R6520 Severe sepsis without septic shock: Secondary | ICD-10-CM

## 2021-12-03 DIAGNOSIS — J9611 Chronic respiratory failure with hypoxia: Secondary | ICD-10-CM | POA: Diagnosis not present

## 2021-12-03 DIAGNOSIS — B955 Unspecified streptococcus as the cause of diseases classified elsewhere: Secondary | ICD-10-CM

## 2021-12-03 DIAGNOSIS — J189 Pneumonia, unspecified organism: Secondary | ICD-10-CM | POA: Diagnosis not present

## 2021-12-03 DIAGNOSIS — J154 Pneumonia due to other streptococci: Secondary | ICD-10-CM

## 2021-12-03 DIAGNOSIS — J449 Chronic obstructive pulmonary disease, unspecified: Secondary | ICD-10-CM | POA: Diagnosis not present

## 2021-12-03 DIAGNOSIS — A419 Sepsis, unspecified organism: Secondary | ICD-10-CM | POA: Diagnosis not present

## 2021-12-03 LAB — CBC
HCT: 28.2 % — ABNORMAL LOW (ref 36.0–46.0)
Hemoglobin: 7.6 g/dL — ABNORMAL LOW (ref 12.0–15.0)
MCH: 20.9 pg — ABNORMAL LOW (ref 26.0–34.0)
MCHC: 27 g/dL — ABNORMAL LOW (ref 30.0–36.0)
MCV: 77.5 fL — ABNORMAL LOW (ref 80.0–100.0)
Platelets: 537 10*3/uL — ABNORMAL HIGH (ref 150–400)
RBC: 3.64 MIL/uL — ABNORMAL LOW (ref 3.87–5.11)
RDW: 20.7 % — ABNORMAL HIGH (ref 11.5–15.5)
WBC: 13.3 10*3/uL — ABNORMAL HIGH (ref 4.0–10.5)
nRBC: 1.1 % — ABNORMAL HIGH (ref 0.0–0.2)

## 2021-12-03 LAB — GLUCOSE, CAPILLARY
Glucose-Capillary: 135 mg/dL — ABNORMAL HIGH (ref 70–99)
Glucose-Capillary: 113 mg/dL — ABNORMAL HIGH (ref 70–99)
Glucose-Capillary: 115 mg/dL — ABNORMAL HIGH (ref 70–99)
Glucose-Capillary: 169 mg/dL — ABNORMAL HIGH (ref 70–99)

## 2021-12-03 LAB — BASIC METABOLIC PANEL
Anion gap: 11 (ref 5–15)
BUN: 8 mg/dL (ref 8–23)
CO2: 27 mmol/L (ref 22–32)
Calcium: 8.9 mg/dL (ref 8.9–10.3)
Chloride: 101 mmol/L (ref 98–111)
Creatinine, Ser: 0.74 mg/dL (ref 0.44–1.00)
GFR, Estimated: >60 mL/min (ref >60)
Glucose, Bld: 174 mg/dL — ABNORMAL HIGH (ref 70–99)
Potassium: 3.7 mmol/L (ref 3.5–5.1)
Sodium: 139 mmol/L (ref 135–145)

## 2021-12-03 LAB — CULTURE, BLOOD (ROUTINE X 2): Culture: NO GROWTH

## 2021-12-03 LAB — ECHOCARDIOGRAM LIMITED
Height: 60 in
Weight: 3410.96 oz

## 2021-12-03 LAB — LEGIONELLA PNEUMOPHILA SEROGP 1 UR AG: L. pneumophila Serogp 1 Ur Ag: NEGATIVE

## 2021-12-03 LAB — MAGNESIUM: Magnesium: 1.5 mg/dL — ABNORMAL LOW (ref 1.7–2.4)

## 2021-12-03 MED ORDER — MAGNESIUM SULFATE 2 GM/50ML IV SOLN
2.0000 g | Freq: Once | INTRAVENOUS | Status: AC
  Administered 2021-12-03: 2 g via INTRAVENOUS
  Filled 2021-12-03: qty 50

## 2021-12-03 MED ORDER — BISACODYL 5 MG PO TBEC
5.0000 mg | DELAYED_RELEASE_TABLET | Freq: Every day | ORAL | Status: DC | PRN
  Administered 2021-12-03: 5 mg via ORAL
  Filled 2021-12-03: qty 1

## 2021-12-03 MED ORDER — POLYETHYLENE GLYCOL 3350 17 G PO PACK
17.0000 g | PACK | Freq: Every day | ORAL | Status: DC | PRN
  Administered 2021-12-03: 17 g via ORAL
  Filled 2021-12-03: qty 1

## 2021-12-03 MED ORDER — POLYETHYLENE GLYCOL 3350 17 G PO PACK
17.0000 g | PACK | Freq: Two times a day (BID) | ORAL | Status: DC
Start: 2021-12-03 — End: 2021-12-04
  Administered 2021-12-04: 17 g via ORAL
  Filled 2021-12-03 (×2): qty 1

## 2021-12-03 NOTE — Progress Notes (Signed)
Pt reports she was awaken suddenly by unknown reason, feels as though if she closes her eyes she won't wake up. No c/o pain or nausea. Pt describes feeling as fullness in abdomen, appeared [facility] for several minutes, O2 sat >95% on 2L [place]. Last BM-very small amount 6/8. Restarted on metoprolol 6/11 d/t frequent runs of PACs, PVCs. Mag lab not drawn since prior admission in May.   Dr Alcario Drought paged & orders received Mag lab & daily Miralax Magnesium result = 1.5 Order for 2g IV magnesium

## 2021-12-03 NOTE — Care Management Important Message (Signed)
Important Message  Patient Details  Name: Amsi Grimley MRN: [id number] Date of Birth: 09/05/1950   Medicare Important Message Given:  Yes     Shelda Altes 12/03/2021, 8:29 AM

## 2021-12-03 NOTE — Progress Notes (Signed)
Physical Therapy Treatment Patient Details Name: [name] MRN: [id number] DOB: 07/20/1950 Today's Date: 12/03/2021   History of Present Illness 71 yo female admitted 6/7 with SOB and sepsis due to PNA. PMHx: COPD on 4L, HFpEF, PAD, HTN, GIB, DM    PT Comments    PT pleasant and reports frustration with social situation at home with grandkids not working but having great grands and asking for her food stamps. Pt with increased ambulation distance with RW and maintained sats >90% on 4L with activity and 2L at rest. Pt reports she does not know how to adjust oxygen at home and that only her daughter checks her pulse ox. Pt encouraged to monitor her own oxygen and to have education from family and HHPT to adjust oxygen. Pt encouraged to be OOB to toilet acutely. Will continue to follow.   HR 78-90    Recommendations for follow up therapy are one component of a multi-disciplinary discharge planning process, led by the attending physician.  Recommendations may be updated based on patient status, additional functional criteria and insurance authorization.  Follow Up Recommendations  Home health PT     Assistance Recommended at Discharge Intermittent Supervision/Assistance  Patient can return home with the following Assistance with cooking/housework;Assist for transportation   Equipment Recommendations  None recommended by PT    Recommendations for Other Services       Precautions / Restrictions Precautions Precautions: Fall Precaution Comments: monitor O2 (wears 3 L O2 at baseline)     Mobility  Bed Mobility Overal bed mobility: Modified Independent             General bed mobility comments: with rail and HOB 10 degrees    Transfers Overall transfer level: Modified independent                 General transfer comment: pt able to rise from bed and lower to chair without physical assist    Ambulation/Gait Ambulation/Gait assistance: Supervision Gait  Distance (Feet): 200 Feet Assistive device: Rolling walker (2 wheels) Gait Pattern/deviations: Step-through pattern, Decreased stride length   Gait velocity interpretation: 1.31 - 2.62 ft/sec, indicative of limited community ambulator   General Gait Details: pt with improved gait distance with use of RW with 3-4L during gait with SpO2 90-97%, cues for pursed lip breathing with 2 standing rest breaks and encouraged use of rollator at home   Stairs Stairs:  (pt declined attempting)           Wheelchair Mobility    Modified Rankin (Stroke Patients Only)       Balance Overall balance assessment: Mild deficits observed, not formally tested                                          Cognition Arousal/Alertness: Awake/alert Behavior During Therapy: WFL for tasks assessed/performed Overall Cognitive Status: Within Functional Limits for tasks assessed                                 General Comments: pt reports she does not monitor SPO2 and does not know how adjust or work condenser at home (afraid she will mess up something)        Exercises      General Comments        Pertinent Vitals/Pain Pain Assessment Pain Assessment: No/denies  pain    Home Living                          Prior Function            PT Goals (current goals can now be found in the care plan section) Progress towards PT goals: Progressing toward goals    Frequency    Min 3X/week      PT Plan Current plan remains appropriate    Co-evaluation              AM-PAC PT "6 Clicks" Mobility   Outcome Measure  Help needed turning from your back to your side while in a flat bed without using bedrails?: None Help needed moving from lying on your back to sitting on the side of a flat bed without using bedrails?: None Help needed moving to and from a bed to a chair (including a wheelchair)?: None Help needed standing up from a chair using your  arms (e.g., wheelchair or bedside chair)?: A Little Help needed to walk in hospital room?: A Little Help needed climbing 3-5 steps with a railing? : A Little 6 Click Score: 21    End of Session Equipment Utilized During Treatment: Oxygen Activity Tolerance: Patient tolerated treatment well Patient left: in chair;with call bell/phone within reach;with chair alarm set Nurse Communication: Mobility status PT Visit Diagnosis: Other abnormalities of gait and mobility (R26.89);Difficulty in walking, not elsewhere classified (R26.2)     Time: 5615-3794 PT Time Calculation (min) (ACUTE ONLY): 39 min  Charges:  $Gait Training: 23-37 mins                     Bayard Males, PT Acute Rehabilitation Services Office: [place] 12/03/2021, 11:47 AM

## 2021-12-03 NOTE — Consult Note (Signed)
[facility]    Date of Admission:  11/28/2021      Total days of antibiotics 6  Ceftriaxone 6/07 >>     Reason for Consult: Strep pneumo bacteremia     Referring Provider: Sloan Leiter  Primary Care Provider: Benito Mccreedy, MD   Assessment: [name] is a 71 y.o. female with COPD admitted for acute community acquired pneumonia with secondary bacteremia with Strep pneumoniae in 1/4 bottles drawn. She has clinically improved from infection stand point with resolution of her fevers and downward trend of wbc (33.0 >> 13.3K). She states her breathing is baseline, which is not good, sputum decreased. As long as TTE is OK (I suspect it will be), seems like a good time to switch to oral antibiotics now - would continue with PO omnicef to complete 14 days of treatment.   COPD, un-staged - H/O COVID 33m ago. Explained natural progression of COPD in relation to her current trouble with SOB, activity intolerance and needing to take frequent breaks to provide care for herself. She and her daughter do live together.  She has an appointment coming up with [facility] late July. Encouraged her to keep this appointment to help with maintenance medications for hope to improve her breathing and feel better.    Plan: TTE pending If no changes from recent study in May switch to PO omnicef to finish treatment FU with Pulm outpatient for COPD maintenance care      Principal Problem:   Sepsis ([place]) Active Problems:   COPD (chronic obstructive pulmonary disease) ([place])   Type 2 diabetes mellitus without complication, without long-term current use of insulin (HCC)   Chronic respiratory failure with hypoxia (HCC)   CAP (community acquired pneumonia)    arformoterol  15 mcg Nebulization BID   aspirin EC  81 mg Oral Daily   atorvastatin  80 mg Oral Daily   budesonide (PULMICORT) nebulizer solution  0.25 mg Nebulization BID   ezetimibe  10 mg Oral Daily    ferrous sulfate  325 mg Oral Daily   furosemide  40 mg Oral Daily   gabapentin  600 mg Oral QHS   guaiFENesin  600 mg Oral BID   heparin  5,000 Units Subcutaneous Q8H   insulin aspart  0-9 Units Subcutaneous TID WC   metoprolol tartrate  25 mg Oral BID   mirabegron ER  25 mg Oral Daily   pantoprazole  40 mg Oral BID   polyethylene glycol  17 g Oral BID   revefenacin  175 mcg Nebulization Daily    HPI: [name] is a 71 y.o. female admitted from home with fever, cough and SOB.   She has a history of COPD on 4 LPM home O2, HFpEF, PAD, HTN, chronic GIB.   She is frustrated because of frequency of hospitalizations since November 2022 for various medical problems. She states she is not ready to go home at this time because she is still quite week and unable to hlep care for herself well. She says she has not been on home O2 very long and has not seen outpatient pulmonology yet. She says her daughter made her come this time to the hospital when her breathing "got bad" and had high fevers.  She has not had any further episodes of fevers since she was started on IV antibiotics. Wants more explanation of the blood infection as to what that's about.   No hardware  in place anywhere.  No headaches, neck pain.    Review of Systems: Review of Systems  Constitutional:  Negative for chills, fever and malaise/fatigue.  Respiratory:  Positive for cough and shortness of breath.   Cardiovascular:  Negative for leg swelling.  Gastrointestinal:  Negative for abdominal pain, diarrhea and vomiting.  Genitourinary: Negative.   Musculoskeletal:  Negative for back pain and joint pain.  Skin:  Negative for rash.  Neurological:  Negative for dizziness, weakness and headaches.    Past Medical History:  Diagnosis Date   Anemia 12/26/2015   Anxiety    Arthritis    Blood transfusion without reported diagnosis    Chronic pain    resolved per pt 04/12/20   COPD (chronic obstructive pulmonary  disease) ([place])    Coronary artery disease    Depression    Diabetes mellitus without complication (HCC)    type 2   GERD (gastroesophageal reflux disease)    Heart murmur    since birth; Echo 02/18/19: LVEF 37%, grade 1 diastolic dysfunction, mild AS (mean grad 12 mmHg), trace MR/PR, mild TR, PASP 32 mmHg     Hyperlipemia    Hypertension    PVD (peripheral vascular disease) ([place])    right SFA stent 02/11/17 by Dr. Einar Gip   Sleep apnea    does not use cpap   Wears glasses     Social History   Tobacco Use   Smoking status: Former    Packs/day: 0.50    Years: 41.00    Total pack years: 20.50    Types: Cigarettes   Smokeless tobacco: Never  Vaping Use   Vaping Use: Never used  Substance Use Topics   Alcohol use: Yes    Comment: occasional   Drug use: Yes    Types: Marijuana, Cocaine    Comment: Last use 04/11/20    Family History  Problem Relation Age of Onset   Diabetes Mother    Hypertension Mother    Heart disease Mother    Heart disease Father    Hypertension Father    Hypertension Sister    Hypertension Brother    Diabetes Brother    Hypertension Sister    Hypertension Brother    Diabetes Brother    Hypertension Brother    Hypertension Brother    Hypertension Brother    No Known Allergies  OBJECTIVE: Blood pressure (!) 113/55, pulse 81, temperature 98.5 F (36.9 C), temperature source Oral, resp. rate 20, height 5' (1.524 m), weight 96.7 kg, SpO2 100 %.  Physical Exam Constitutional:      Appearance: She is well-developed. She is not ill-appearing.     Comments: Sitting up in chair watching TV.   Pulmonary:     Effort: Pulmonary effort is normal. No tachypnea or accessory muscle usage.     Breath sounds: Normal breath sounds.  Skin:    General: Skin is warm and dry.     Capillary Refill: Capillary refill takes less than 2 seconds.  Neurological:     Mental Status: She is alert.     Lab Results Lab Results  Component Value Date   WBC 13.3 (H)  12/03/2021   HGB 7.6 (L) 12/03/2021   HCT 28.2 (L) 12/03/2021   MCV 77.5 (L) 12/03/2021   PLT 537 (H) 12/03/2021    Lab Results  Component Value Date   CREATININE 0.74 12/03/2021   BUN 8 12/03/2021   NA 139 12/03/2021   K 3.7 12/03/2021   CL 101  12/03/2021   CO2 27 12/03/2021    Lab Results  Component Value Date   ALT 18 11/28/2021   AST 32 11/28/2021   ALKPHOS 59 11/28/2021   BILITOT 0.7 11/28/2021     Microbiology: Recent Results (from the past 240 hour(s))  Culture, blood (Routine x 2)     Status: Abnormal   Collection Time: 11/28/21 10:23 PM   Specimen: BLOOD  Result Value Ref Range Status   Specimen Description BLOOD RIGHT ANTECUBITAL  Final   Special Requests   Final    BOTTLES DRAWN AEROBIC AND ANAEROBIC Blood Culture adequate volume   Culture  Setup Time   Final    GRAM POSITIVE COCCI IN CHAINS AEROBIC BOTTLE ONLY CRITICAL RESULT CALLED TO, READ BACK BY AND VERIFIED WITH: PHARMD CARLA JARDIN ON 11/29/21 @ 1606 BY DRT Performed at [facility], [place] [address] [place], [place] [address]    Culture STREPTOCOCCUS PNEUMONIAE (A)  Final   Report Status 12/01/2021 FINAL  Final   Organism ID, Bacteria STREPTOCOCCUS PNEUMONIAE  Final      Susceptibility   Streptococcus pneumoniae - MIC*    ERYTHROMYCIN <=0.12 SENSITIVE Sensitive     LEVOFLOXACIN 0.5 SENSITIVE Sensitive     VANCOMYCIN 0.5 SENSITIVE Sensitive     PENICILLIN (meningitis) <=0.06 SENSITIVE Sensitive     PENO - penicillin <=0.06      PENICILLIN (non-meningitis) <=0.06 SENSITIVE Sensitive     PENICILLIN (oral) <=0.06 SENSITIVE Sensitive     CEFTRIAXONE (non-meningitis) <=0.12 SENSITIVE Sensitive     CEFTRIAXONE (meningitis) <=0.12 SENSITIVE Sensitive     * STREPTOCOCCUS PNEUMONIAE  Blood Culture ID Panel (Reflexed)     Status: Abnormal   Collection Time: 11/28/21 10:23 PM  Result Value Ref Range Status   Enterococcus faecalis NOT DETECTED NOT DETECTED Final   Enterococcus Faecium NOT DETECTED NOT  DETECTED Final   Listeria monocytogenes NOT DETECTED NOT DETECTED Final   Staphylococcus species NOT DETECTED NOT DETECTED Final   Staphylococcus aureus (BCID) NOT DETECTED NOT DETECTED Final   Staphylococcus epidermidis NOT DETECTED NOT DETECTED Final   Staphylococcus lugdunensis NOT DETECTED NOT DETECTED Final   Streptococcus species DETECTED (A) NOT DETECTED Final    Comment: CRITICAL RESULT CALLED TO, READ BACK BY AND VERIFIED WITH: PHARMD CARLA JARDIN ON 11/29/21 @ 1606 BY DRT    Streptococcus agalactiae NOT DETECTED NOT DETECTED Final   Streptococcus pneumoniae DETECTED (A) NOT DETECTED Final    Comment: CRITICAL RESULT CALLED TO, READ BACK BY AND VERIFIED WITH: PHARMD CARLA JARDIN ON 11/29/21 @ 1606 BY DRT    Streptococcus pyogenes NOT DETECTED NOT DETECTED Final   A.calcoaceticus-baumannii NOT DETECTED NOT DETECTED Final   Bacteroides fragilis NOT DETECTED NOT DETECTED Final   Enterobacterales NOT DETECTED NOT DETECTED Final   Enterobacter cloacae complex NOT DETECTED NOT DETECTED Final   Escherichia coli NOT DETECTED NOT DETECTED Final   Klebsiella aerogenes NOT DETECTED NOT DETECTED Final   Klebsiella oxytoca NOT DETECTED NOT DETECTED Final   Klebsiella pneumoniae NOT DETECTED NOT DETECTED Final   Proteus species NOT DETECTED NOT DETECTED Final   Salmonella species NOT DETECTED NOT DETECTED Final   Serratia marcescens NOT DETECTED NOT DETECTED Final   Haemophilus influenzae NOT DETECTED NOT DETECTED Final   Neisseria meningitidis NOT DETECTED NOT DETECTED Final   Pseudomonas aeruginosa NOT DETECTED NOT DETECTED Final   Stenotrophomonas maltophilia NOT DETECTED NOT DETECTED Final   Candida albicans NOT DETECTED NOT DETECTED Final   Candida auris NOT  DETECTED NOT DETECTED Final   Candida glabrata NOT DETECTED NOT DETECTED Final   Candida krusei NOT DETECTED NOT DETECTED Final   Candida parapsilosis NOT DETECTED NOT DETECTED Final   Candida tropicalis NOT DETECTED NOT DETECTED  Final   Cryptococcus neoformans/gattii NOT DETECTED NOT DETECTED Final    Comment: Performed at [facility], [place] [address] [place], [place] [address]  Culture, blood (Routine x 2)     Status: None   Collection Time: 11/28/21 10:25 PM   Specimen: BLOOD RIGHT HAND  Result Value Ref Range Status   Specimen Description BLOOD RIGHT HAND  Final   Special Requests   Final    BOTTLES DRAWN AEROBIC AND ANAEROBIC Blood Culture results may not be optimal due to an excessive volume of blood received in culture bottles   Culture   Final    NO GROWTH 5 DAYS Performed at [facility], [place] [address], [place], [place] [address]    Report Status 12/03/2021 FINAL  Final  SARS Coronavirus 2 by RT PCR (hospital order, performed in [facility] hospital lab) *cepheid single result test* Anterior Nasal Swab     Status: None   Collection Time: 11/28/21 10:25 PM   Specimen: Anterior Nasal Swab  Result Value Ref Range Status   SARS Coronavirus 2 by RT PCR NEGATIVE NEGATIVE Final    Comment: (NOTE) SARS-CoV-2 target nucleic acids are NOT DETECTED.  The SARS-CoV-2 RNA is generally detectable in upper and lower respiratory specimens during the acute phase of infection. The lowest concentration of SARS-CoV-2 viral copies this assay can detect is 250 copies / mL. A negative result does not preclude SARS-CoV-2 infection and should not be used as the sole basis for treatment or other patient management decisions.  A negative result may occur with improper specimen collection / handling, submission of specimen other than nasopharyngeal swab, presence of viral mutation(s) within the areas targeted by this assay, and inadequate number of viral copies (<250 copies / mL). A negative result must be combined with clinical observations, patient history, and epidemiological information.  Fact Sheet for Patients:   https://www.patel.info/  Fact Sheet for Healthcare  Providers: https://hall.com/  This test is not yet approved or  cleared by the [place] FDA and has been authorized for detection and/or diagnosis of SARS-CoV-2 by FDA under an Emergency Use Authorization (EUA).  This EUA will remain in effect (meaning this test can be used) for the duration of the COVID-19 declaration under Section 564(b)(1) of the Act, 21 U.S.C. section 360bbb-3(b)(1), unless the authorization is terminated or revoked sooner.  Performed at [facility], [place] [address] [place], [place] [address]   Resp Panel by RT-PCR (Flu A&B, Covid) Anterior Nasal Swab     Status: None   Collection Time: 11/28/21 10:34 PM   Specimen: Anterior Nasal Swab  Result Value Ref Range Status   SARS Coronavirus 2 by RT PCR NEGATIVE NEGATIVE Final    Comment: (NOTE) SARS-CoV-2 target nucleic acids are NOT DETECTED.  The SARS-CoV-2 RNA is generally detectable in upper respiratory specimens during the acute phase of infection. The lowest concentration of SARS-CoV-2 viral copies this assay can detect is 138 copies/mL. A negative result does not preclude SARS-Cov-2 infection and should not be used as the sole basis for treatment or other patient management decisions. A negative result may occur with  improper specimen collection/handling, submission of specimen other than nasopharyngeal swab, presence of viral mutation(s) within the areas targeted by  this assay, and inadequate number of viral copies(<138 copies/mL). A negative result must be combined with clinical observations, patient history, and epidemiological information. The expected result is Negative.  Fact Sheet for Patients:  EntrepreneurPulse.com.au  Fact Sheet for Healthcare Providers:  IncredibleEmployment.be  This test is no t yet approved or cleared by the [place] FDA and  has been authorized for detection and/or diagnosis of SARS-CoV-2 by FDA  under an Emergency Use Authorization (EUA). This EUA will remain  in effect (meaning this test can be used) for the duration of the COVID-19 declaration under Section 564(b)(1) of the Act, 21 U.S.C.section 360bbb-3(b)(1), unless the authorization is terminated  or revoked sooner.       Influenza A by PCR NEGATIVE NEGATIVE Final   Influenza B by PCR NEGATIVE NEGATIVE Final    Comment: (NOTE) The Xpert Xpress SARS-CoV-2/FLU/RSV plus assay is intended as an aid in the diagnosis of influenza from Nasopharyngeal swab specimens and should not be used as a sole basis for treatment. Nasal washings and aspirates are unacceptable for Xpert Xpress SARS-CoV-2/FLU/RSV testing.  Fact Sheet for Patients: EntrepreneurPulse.com.au  Fact Sheet for Healthcare Providers: IncredibleEmployment.be  This test is not yet approved or cleared by the [place] FDA and has been authorized for detection and/or diagnosis of SARS-CoV-2 by FDA under an Emergency Use Authorization (EUA). This EUA will remain in effect (meaning this test can be used) for the duration of the COVID-19 declaration under Section 564(b)(1) of the Act, 21 U.S.C. section 360bbb-3(b)(1), unless the authorization is terminated or revoked.  Performed at [facility], [place] [address] [place], [place] [address]   Urine Culture     Status: None   Collection Time: 11/29/21  3:43 PM   Specimen: In/Out Cath Urine  Result Value Ref Range Status   Specimen Description IN/OUT CATH URINE  Final   Special Requests NONE  Final   Culture   Final    NO GROWTH Performed at [facility], [place] [address] [place], [place] [address]    Report Status 11/30/2021 FINAL  Final  Culture, blood (Routine X 2) w Reflex to ID Panel     Status: None (Preliminary result)   Collection Time: 12/01/21  9:01 AM   Specimen: BLOOD  Result Value Ref Range Status   Specimen Description BLOOD LEFT ANTECUBITAL  Final    Special Requests   Final    BOTTLES DRAWN AEROBIC AND ANAEROBIC Blood Culture adequate volume   Culture   Final    NO GROWTH 2 DAYS Performed at [facility], [place] [address], [place], [place] [address]    Report Status PENDING  Incomplete  Culture, blood (Routine X 2) w Reflex to ID Panel     Status: None (Preliminary result)   Collection Time: 12/01/21  9:09 AM   Specimen: BLOOD LEFT HAND  Result Value Ref Range Status   Specimen Description BLOOD LEFT HAND  Final   Special Requests   Final    BOTTLES DRAWN AEROBIC AND ANAEROBIC Blood Culture adequate volume   Culture   Final    NO GROWTH 2 DAYS Performed at [facility], [place] [address] [place], [place] [address]    Report Status PENDING  Incomplete     Janene Madeira, MSN, NP-C [facility] [email or web address] Pager: [phone] Office: [phone] [facility] Main Line: [place] Communication Welcome

## 2021-12-03 NOTE — TOC Progression Note (Signed)
Transition of Care [facility]) - Progression Note    Patient Details  Name: Ysenia Filice MRN: [id number] Date of Birth: 1951-04-07  [facility] CM/SW Contact  Graves-Bigelow, Ocie Cornfield, RN Phone Number: 12/03/2021, 3:55 PM  Clinical Narrative: Case Manager called daughter Andee Poles to discuss plan of care- no answer. Case Manager called [facility] and they will not accept the patient back for services since the PCP will not sign orders. Case Manager then called [facility]- the agency is reaching out to the PCP office to see if they will sign for home health orders. Agency to call Case Manager back in the morning. Case Manager will continue to follow for additional transition of care needs.   Expected Discharge Plan: [place] Barriers to Discharge: Continued Medical Work up  Expected Discharge Plan and Services Expected Discharge Plan: [place]   Discharge Planning Services: CM Consult       Readmission Risk Interventions    08/28/2021   10:31 AM  Readmission Risk Prevention Plan  Transportation Screening Complete  PCP or Specialist Appt within 3-5 Days Complete  HRI or [place] Complete  Social Work Consult for [place] Planning/Counseling Complete  Palliative Care Screening Complete  Medication Review Press photographer) Complete

## 2021-12-03 NOTE — Progress Notes (Addendum)
PROGRESS NOTE        PATIENT DETAILS Name: [name] Age: 71 y.o. Sex: female Date of Birth: October 16, 1950 Admit Date: 11/28/2021 Admitting Physician Rise Patience, MD [id number], Iona Beard, MD  Brief Summary: Patient is a 71 y.o.  female with history of COPD on 4 L of oxygen at home, HFpEF, PAD, HTN, chronic GI bleeding due to AVMs-presented with fever, cough, shortness of breath-was found to have sepsis due to PNA and subsequently admitted to the hospitalist service.  Significant events: 6/07>> admit to [facility] for sepsis due to PNA  Significant studies: 5/18>> TTE: EF 23-53%, grade 1 diastolic dysfunction 6/14>> CXR: Left PNA  Significant microbiology data: 6/07>> COVID PCR: Negative 6/07>> blood culture: 1/2 strep pneumoniae 6/10>> blood culture: negative  Procedures: None  Consults: None   Subjective: Complains of weakness but otherwise appears stable.  No major issues overnight.  Only complaint is constipation.  Objective: Vitals: Blood pressure (!) 113/55, pulse 81, temperature 98.5 F (36.9 C), temperature source Oral, resp. rate 20, height 5' (1.524 m), weight 96.7 kg, SpO2 100 %.   Exam: Gen Exam:Alert awake-not in any distress HEENT:atraumatic, normocephalic Chest: B/L clear to auscultation anteriorly CVS:S1S2 regular Abdomen:soft non tender, non distended Extremities:no edema Neurology: Non focal Skin: no rash    Pertinent Labs/Radiology:    Latest Ref Rng & Units 12/03/2021    1:34 AM 12/02/2021   12:30 AM 11/30/2021    6:41 AM  CBC  WBC 4.0 - 10.5 K/uL 13.3  11.8  13.4   Hemoglobin 12.0 - 15.0 g/dL 7.6  7.3  7.0   Hematocrit 36.0 - 46.0 % 28.2  27.1  26.1   Platelets 150 - 400 K/uL 537  430  303     Lab Results  Component Value Date   NA 139 12/03/2021   K 3.7 12/03/2021   CL 101 12/03/2021   CO2 27 12/03/2021     Assessment/Plan: Sepsis due to Streptococcus pneumonia bacteremia and community-acquired  pneumonia: Sepsis physiology has resolved-clinically improved-repeat cultures negative-chart reviewed with  ID MD-Dr. Douglass Rivers limited echo to ensure no obvious vegetations-plan on 14 days of treatment from 6/10 (negative blood cultures).  For now continue Rocephin-when closer to discharge-we will switch to an oral antibiotic agent.  AKI: Likely hemodynamically mediated in the setting of sepsis/ARB/hypotension.  Creatinine now has normalized.  COPD: Not in exacerbation-continue bronchodilators.  No indication for steroids at this point.  Chronic hypoxic respiratory failure: On 3-4 L of oxygen at baseline.  Chronic GI bleeding with chronic blood loss anemia: In the setting of AVMs-reviewed prior GI notes-patient has been referred to [facility].  Follow CBC closely and transfuse if Hb<7  Chronic HFpEF: Volume status stable-continue Lasix.  CAD: No anginal symptoms.  Continue aspirin/statin  PAD-s/p right SFA PCI 2014: Continue aspirin/statin  HTN: BP stable-continue metoprolol/furosemide.   Resume losartan/Aldactone over the next few days.   MAT/atrial tachycardia: No further episodes-continue telemetry monitoring-on beta-blocker.  Hypomagnesemia: We will give a total of 4 g of magnesium today-repeat electrolytes tomorrow.  DM-2 (A1c 4.9 on 3/21): Continue SSI and follow CBG trend.  Resume metformin on discharge.  Recent Labs    12/02/21 2130 12/03/21 0741 12/03/21 1136  GLUCAP 107* 135* 113*    Debility/deconditioning: Complains of ongoing weakness-inquiring about SNF-we will have physical therapy reevaluate patient to see if home health is  still appropriate.  She may need SNF.  Morbid Obesity: Estimated body mass index is 41.63 kg/m as calculated from the following:   Height as of this encounter: 5' (1.524 m).   Weight as of this encounter: 96.7 kg.   Code status:   Code Status: Full Code   DVT Prophylaxis: heparin injection 5,000 Units Start: 11/29/21 0600   Family  Communication: Daughter-Danielle-[phone] updated over the phone on 6/12   Disposition Plan: Status is: Inpatient Remains inpatient appropriate because: Sepsis due to PNA-not yet stable for discharge.  Possible discharge on 6/13 if echo stable and clinically stable.   Planned Discharge Destination: Home health   Diet: Diet Order             Diet heart healthy/carb modified Room service appropriate? Yes; Fluid consistency: Thin; Fluid restriction: 1200 mL Fluid  Diet effective now                     Antimicrobial agents: Anti-infectives (From admission, onward)    Start     Dose/Rate Route Frequency Ordered Stop   11/29/21 2200  cefTRIAXone (ROCEPHIN) 2 g in sodium chloride 0.9 % 100 mL IVPB        2 g 200 mL/hr over 30 Minutes Intravenous Every 24 hours 11/29/21 1625     11/29/21 1100  ceFEPIme (MAXIPIME) 2 g in sodium chloride 0.9 % 100 mL IVPB  Status:  Discontinued        2 g 200 mL/hr over 30 Minutes Intravenous Every 12 hours 11/28/21 2328 11/29/21 1625   11/29/21 0100  azithromycin (ZITHROMAX) 500 mg in sodium chloride 0.9 % 250 mL IVPB  Status:  Discontinued        500 mg 250 mL/hr over 60 Minutes Intravenous Every 24 hours 11/29/21 0054 11/29/21 1625   11/28/21 2335  vancomycin variable dose per unstable renal function (pharmacist dosing)  Status:  Discontinued         Does not apply See admin instructions 11/28/21 2335 11/29/21 1625   11/28/21 2330  vancomycin (VANCOREADY) IVPB 2000 mg/400 mL        2,000 mg 200 mL/hr over 120 Minutes Intravenous  Once 11/28/21 2328 11/29/21 0222   11/28/21 2245  vancomycin (VANCOCIN) IVPB 1000 mg/200 mL premix  Status:  Discontinued        1,000 mg 200 mL/hr over 60 Minutes Intravenous  Once 11/28/21 2235 11/28/21 2328   11/28/21 2245  ceFEPIme (MAXIPIME) 2 g in sodium chloride 0.9 % 100 mL IVPB        2 g 200 mL/hr over 30 Minutes Intravenous  Once 11/28/21 2235 11/28/21 2336        MEDICATIONS: Scheduled Meds:   arformoterol  15 mcg Nebulization BID   aspirin EC  81 mg Oral Daily   atorvastatin  80 mg Oral Daily   budesonide (PULMICORT) nebulizer solution  0.25 mg Nebulization BID   ezetimibe  10 mg Oral Daily   ferrous sulfate  325 mg Oral Daily   furosemide  40 mg Oral Daily   gabapentin  600 mg Oral QHS   guaiFENesin  600 mg Oral BID   heparin  5,000 Units Subcutaneous Q8H   insulin aspart  0-9 Units Subcutaneous TID WC   metoprolol tartrate  25 mg Oral BID   mirabegron ER  25 mg Oral Daily   pantoprazole  40 mg Oral BID   revefenacin  175 mcg Nebulization Daily   Continuous Infusions:  cefTRIAXone (  ROCEPHIN)  IV 2 g (12/02/21 2138)   PRN Meds:.acetaminophen **OR** acetaminophen, benzonatate, guaiFENesin-dextromethorphan, hydrALAZINE, ipratropium-albuterol, nitroGLYCERIN, polyethylene glycol   I have personally reviewed following labs and imaging studies  LABORATORY DATA: CBC: Recent Labs  Lab 11/28/21 2223 11/29/21 0244 11/30/21 0641 12/02/21 0030 12/03/21 0134  WBC 28.2* 33.0* 13.4* 11.8* 13.3*  NEUTROABS 25.7*  --   --   --   --   HGB 7.7* 7.5* 7.0* 7.3* 7.6*  HCT 27.9* 27.8* 26.1* 27.1* 28.2*  MCV 78.4* 79.7* 78.9* 77.9* 77.5*  PLT 341 310 303 430* 537*     Basic Metabolic Panel: Recent Labs  Lab 11/29/21 0244 11/30/21 0641 12/01/21 0138 12/02/21 0030 12/03/21 0134  NA 134* 137 135 136 139  K 4.7 4.7 4.4 4.1 3.7  CL 103 106 104 103 101  CO2 21* 22 22 25 27   GLUCOSE 180* 104* 140* 127* 174*  BUN 19 12 9  7* 8  CREATININE 1.24* 0.76 0.84 0.75 0.74  CALCIUM 8.4* 8.6* 9.0 8.4* 8.9  MG  --   --   --   --  1.5*     GFR: Estimated Creatinine Clearance: 67.2 mL/min (by C-G formula based on SCr of 0.74 mg/dL).  Liver Function Tests: Recent Labs  Lab 11/28/21 2223  AST 32  ALT 18  ALKPHOS 59  BILITOT 0.7  PROT 6.6  ALBUMIN 2.5*    No results for input(s): "LIPASE", "AMYLASE" in the last 168 hours. No results for input(s): "AMMONIA" in the last 168  hours.  Coagulation Profile: Recent Labs  Lab 11/28/21 2223  INR 1.3*     Cardiac Enzymes: No results for input(s): "CKTOTAL", "CKMB", "CKMBINDEX", "TROPONINI" in the last 168 hours.  BNP (last 3 results) No results for input(s): "PROBNP" in the last 8760 hours.  Lipid Profile: No results for input(s): "CHOL", "HDL", "LDLCALC", "TRIG", "CHOLHDL", "LDLDIRECT" in the last 72 hours.  Thyroid Function Tests: No results for input(s): "TSH", "T4TOTAL", "FREET4", "T3FREE", "THYROIDAB" in the last 72 hours.  Anemia Panel: No results for input(s): "VITAMINB12", "FOLATE", "FERRITIN", "TIBC", "IRON", "RETICCTPCT" in the last 72 hours.  Urine analysis:    Component Value Date/Time   COLORURINE YELLOW 11/29/2021 1200   APPEARANCEUR HAZY (A) 11/29/2021 1200   LABSPEC 1.020 11/29/2021 1200   PHURINE 6.0 11/29/2021 1200   GLUCOSEU NEGATIVE 11/29/2021 1200   HGBUR SMALL (A) 11/29/2021 1200   BILIRUBINUR NEGATIVE 11/29/2021 1200   KETONESUR NEGATIVE 11/29/2021 1200   PROTEINUR 100 (A) 11/29/2021 1200   UROBILINOGEN 0.2 04/03/2013 1106   NITRITE NEGATIVE 11/29/2021 1200   LEUKOCYTESUR NEGATIVE 11/29/2021 1200    Sepsis Labs: Lactic Acid, Venous    Component Value Date/Time   LATICACIDVEN 1.4 11/29/2021 0441    MICROBIOLOGY: Recent Results (from the past 240 hour(s))  Culture, blood (Routine x 2)     Status: Abnormal   Collection Time: 11/28/21 10:23 PM   Specimen: BLOOD  Result Value Ref Range Status   Specimen Description BLOOD RIGHT ANTECUBITAL  Final   Special Requests   Final    BOTTLES DRAWN AEROBIC AND ANAEROBIC Blood Culture adequate volume   Culture  Setup Time   Final    GRAM POSITIVE COCCI IN CHAINS AEROBIC BOTTLE ONLY CRITICAL RESULT CALLED TO, READ BACK BY AND VERIFIED WITH: PHARMD CARLA JARDIN ON 11/29/21 @ 1606 BY DRT Performed at [facility], [place] [address], [place], [place] [address]    Culture STREPTOCOCCUS PNEUMONIAE (A)  Final   Report Status  12/01/2021 FINAL  Final   Organism ID, Bacteria STREPTOCOCCUS PNEUMONIAE  Final      Susceptibility   Streptococcus pneumoniae - MIC*    ERYTHROMYCIN <=0.12 SENSITIVE Sensitive     LEVOFLOXACIN 0.5 SENSITIVE Sensitive     VANCOMYCIN 0.5 SENSITIVE Sensitive     PENICILLIN (meningitis) <=0.06 SENSITIVE Sensitive     PENO - penicillin <=0.06      PENICILLIN (non-meningitis) <=0.06 SENSITIVE Sensitive     PENICILLIN (oral) <=0.06 SENSITIVE Sensitive     CEFTRIAXONE (non-meningitis) <=0.12 SENSITIVE Sensitive     CEFTRIAXONE (meningitis) <=0.12 SENSITIVE Sensitive     * STREPTOCOCCUS PNEUMONIAE  Blood Culture ID Panel (Reflexed)     Status: Abnormal   Collection Time: 11/28/21 10:23 PM  Result Value Ref Range Status   Enterococcus faecalis NOT DETECTED NOT DETECTED Final   Enterococcus Faecium NOT DETECTED NOT DETECTED Final   Listeria monocytogenes NOT DETECTED NOT DETECTED Final   Staphylococcus species NOT DETECTED NOT DETECTED Final   Staphylococcus aureus (BCID) NOT DETECTED NOT DETECTED Final   Staphylococcus epidermidis NOT DETECTED NOT DETECTED Final   Staphylococcus lugdunensis NOT DETECTED NOT DETECTED Final   Streptococcus species DETECTED (A) NOT DETECTED Final    Comment: CRITICAL RESULT CALLED TO, READ BACK BY AND VERIFIED WITH: PHARMD CARLA JARDIN ON 11/29/21 @ 1606 BY DRT    Streptococcus agalactiae NOT DETECTED NOT DETECTED Final   Streptococcus pneumoniae DETECTED (A) NOT DETECTED Final    Comment: CRITICAL RESULT CALLED TO, READ BACK BY AND VERIFIED WITH: PHARMD CARLA JARDIN ON 11/29/21 @ 1606 BY DRT    Streptococcus pyogenes NOT DETECTED NOT DETECTED Final   A.calcoaceticus-baumannii NOT DETECTED NOT DETECTED Final   Bacteroides fragilis NOT DETECTED NOT DETECTED Final   Enterobacterales NOT DETECTED NOT DETECTED Final   Enterobacter cloacae complex NOT DETECTED NOT DETECTED Final   Escherichia coli NOT DETECTED NOT DETECTED Final   Klebsiella aerogenes NOT DETECTED  NOT DETECTED Final   Klebsiella oxytoca NOT DETECTED NOT DETECTED Final   Klebsiella pneumoniae NOT DETECTED NOT DETECTED Final   Proteus species NOT DETECTED NOT DETECTED Final   Salmonella species NOT DETECTED NOT DETECTED Final   Serratia marcescens NOT DETECTED NOT DETECTED Final   Haemophilus influenzae NOT DETECTED NOT DETECTED Final   Neisseria meningitidis NOT DETECTED NOT DETECTED Final   Pseudomonas aeruginosa NOT DETECTED NOT DETECTED Final   Stenotrophomonas maltophilia NOT DETECTED NOT DETECTED Final   Candida albicans NOT DETECTED NOT DETECTED Final   Candida auris NOT DETECTED NOT DETECTED Final   Candida glabrata NOT DETECTED NOT DETECTED Final   Candida krusei NOT DETECTED NOT DETECTED Final   Candida parapsilosis NOT DETECTED NOT DETECTED Final   Candida tropicalis NOT DETECTED NOT DETECTED Final   Cryptococcus neoformans/gattii NOT DETECTED NOT DETECTED Final    Comment: Performed at [facility], [place] [address] [place], [place] [address]  Culture, blood (Routine x 2)     Status: None   Collection Time: 11/28/21 10:25 PM   Specimen: BLOOD RIGHT HAND  Result Value Ref Range Status   Specimen Description BLOOD RIGHT HAND  Final   Special Requests   Final    BOTTLES DRAWN AEROBIC AND ANAEROBIC Blood Culture results may not be optimal due to an excessive volume of blood received in culture bottles   Culture   Final    NO GROWTH 5 DAYS Performed at [facility], [place] [address] [place], [place] [address]    Report  Status 12/03/2021 FINAL  Final  SARS Coronavirus 2 by RT PCR (hospital order, performed in [facility] hospital lab) *cepheid single result test* Anterior Nasal Swab     Status: None   Collection Time: 11/28/21 10:25 PM   Specimen: Anterior Nasal Swab  Result Value Ref Range Status   SARS Coronavirus 2 by RT PCR NEGATIVE NEGATIVE Final    Comment: (NOTE) SARS-CoV-2 target nucleic acids are NOT DETECTED.  The SARS-CoV-2 RNA is generally  detectable in upper and lower respiratory specimens during the acute phase of infection. The lowest concentration of SARS-CoV-2 viral copies this assay can detect is 250 copies / mL. A negative result does not preclude SARS-CoV-2 infection and should not be used as the sole basis for treatment or other patient management decisions.  A negative result may occur with improper specimen collection / handling, submission of specimen other than nasopharyngeal swab, presence of viral mutation(s) within the areas targeted by this assay, and inadequate number of viral copies (<250 copies / mL). A negative result must be combined with clinical observations, patient history, and epidemiological information.  Fact Sheet for Patients:   https://www.patel.info/  Fact Sheet for Healthcare Providers: https://hall.com/  This test is not yet approved or  cleared by the [place] FDA and has been authorized for detection and/or diagnosis of SARS-CoV-2 by FDA under an Emergency Use Authorization (EUA).  This EUA will remain in effect (meaning this test can be used) for the duration of the COVID-19 declaration under Section 564(b)(1) of the Act, 21 U.S.C. section 360bbb-3(b)(1), unless the authorization is terminated or revoked sooner.  Performed at [facility], [place] [address], [place], [place] [address]   Resp Panel by RT-PCR (Flu A&B, Covid) Anterior Nasal Swab     Status: None   Collection Time: 11/28/21 10:34 PM   Specimen: Anterior Nasal Swab  Result Value Ref Range Status   SARS Coronavirus 2 by RT PCR NEGATIVE NEGATIVE Final    Comment: (NOTE) SARS-CoV-2 target nucleic acids are NOT DETECTED.  The SARS-CoV-2 RNA is generally detectable in upper respiratory specimens during the acute phase of infection. The lowest concentration of SARS-CoV-2 viral copies this assay can detect is 138 copies/mL. A negative result does not preclude  SARS-Cov-2 infection and should not be used as the sole basis for treatment or other patient management decisions. A negative result may occur with  improper specimen collection/handling, submission of specimen other than nasopharyngeal swab, presence of viral mutation(s) within the areas targeted by this assay, and inadequate number of viral copies(<138 copies/mL). A negative result must be combined with clinical observations, patient history, and epidemiological information. The expected result is Negative.  Fact Sheet for Patients:  EntrepreneurPulse.com.au  Fact Sheet for Healthcare Providers:  IncredibleEmployment.be  This test is no t yet approved or cleared by the [place] FDA and  has been authorized for detection and/or diagnosis of SARS-CoV-2 by FDA under an Emergency Use Authorization (EUA). This EUA will remain  in effect (meaning this test can be used) for the duration of the COVID-19 declaration under Section 564(b)(1) of the Act, 21 U.S.C.section 360bbb-3(b)(1), unless the authorization is terminated  or revoked sooner.       Influenza A by PCR NEGATIVE NEGATIVE Final   Influenza B by PCR NEGATIVE NEGATIVE Final    Comment: (NOTE) The Xpert Xpress SARS-CoV-2/FLU/RSV plus assay is intended as an aid in the diagnosis of influenza from Nasopharyngeal swab specimens and should not be used as a  sole basis for treatment. Nasal washings and aspirates are unacceptable for Xpert Xpress SARS-CoV-2/FLU/RSV testing.  Fact Sheet for Patients: EntrepreneurPulse.com.au  Fact Sheet for Healthcare Providers: IncredibleEmployment.be  This test is not yet approved or cleared by the [place] FDA and has been authorized for detection and/or diagnosis of SARS-CoV-2 by FDA under an Emergency Use Authorization (EUA). This EUA will remain in effect (meaning this test can be used) for the duration of  the COVID-19 declaration under Section 564(b)(1) of the Act, 21 U.S.C. section 360bbb-3(b)(1), unless the authorization is terminated or revoked.  Performed at [facility], [place] [address], [place], [place] [address]   Urine Culture     Status: None   Collection Time: 11/29/21  3:43 PM   Specimen: In/Out Cath Urine  Result Value Ref Range Status   Specimen Description IN/OUT CATH URINE  Final   Special Requests NONE  Final   Culture   Final    NO GROWTH Performed at [facility], [place] [address] [place], [place] [address]    Report Status 11/30/2021 FINAL  Final  Culture, blood (Routine X 2) w Reflex to ID Panel     Status: None (Preliminary result)   Collection Time: 12/01/21  9:01 AM   Specimen: BLOOD  Result Value Ref Range Status   Specimen Description BLOOD LEFT ANTECUBITAL  Final   Special Requests   Final    BOTTLES DRAWN AEROBIC AND ANAEROBIC Blood Culture adequate volume   Culture   Final    NO GROWTH 2 DAYS Performed at [facility], [place] [address], [place], [place] [address]    Report Status PENDING  Incomplete  Culture, blood (Routine X 2) w Reflex to ID Panel     Status: None (Preliminary result)   Collection Time: 12/01/21  9:09 AM   Specimen: BLOOD LEFT HAND  Result Value Ref Range Status   Specimen Description BLOOD LEFT HAND  Final   Special Requests   Final    BOTTLES DRAWN AEROBIC AND ANAEROBIC Blood Culture adequate volume   Culture   Final    NO GROWTH 2 DAYS Performed at [facility], [place] [address], [place], [place] [address]    Report Status PENDING  Incomplete    RADIOLOGY STUDIES/RESULTS: No results found.   LOS: 4 days   Oren Binet, MD  [facility]    To contact the attending provider between 7A-7P or the covering provider during after hours 7P-7A, please log into the web site [email or web address] and access using universal [place] password for that web site. If you do not have the password, please call  the hospital operator.  12/03/2021, 11:50 AM

## 2021-12-03 NOTE — Progress Notes (Signed)
  Echocardiogram 2D Echocardiogram has been performed.  [name] 12/03/2021, 3:21 PM

## 2021-12-04 ENCOUNTER — Other Ambulatory Visit (HOSPITAL_COMMUNITY): Payer: Self-pay

## 2021-12-04 LAB — GLUCOSE, CAPILLARY
Glucose-Capillary: 157 mg/dL — ABNORMAL HIGH (ref 70–99)
Glucose-Capillary: 119 mg/dL — ABNORMAL HIGH (ref 70–99)

## 2021-12-04 LAB — BASIC METABOLIC PANEL
Anion gap: 6 (ref 5–15)
BUN: 7 mg/dL — ABNORMAL LOW (ref 8–23)
CO2: 29 mmol/L (ref 22–32)
Calcium: 8.6 mg/dL — ABNORMAL LOW (ref 8.9–10.3)
Chloride: 103 mmol/L (ref 98–111)
Creatinine, Ser: 0.76 mg/dL (ref 0.44–1.00)
GFR, Estimated: >60 mL/min (ref >60)
Glucose, Bld: 131 mg/dL — ABNORMAL HIGH (ref 70–99)
Potassium: 3.7 mmol/L (ref 3.5–5.1)
Sodium: 138 mmol/L (ref 135–145)

## 2021-12-04 LAB — MAGNESIUM: Magnesium: 1.8 mg/dL (ref 1.7–2.4)

## 2021-12-04 MED ORDER — CEFPODOXIME PROXETIL 200 MG PO TABS
200.0000 mg | ORAL_TABLET | Freq: Two times a day (BID) | ORAL | 0 refills | Status: DC
  Filled 2021-12-04: qty 16, 8d supply, fill #0

## 2021-12-04 NOTE — TOC Transition Note (Signed)
Transition of Care [facility]) - CM/SW Discharge Note   Patient Details  Name: [name] MRN: [id number] Date of Birth: Oct 27, 1950  [facility] of [facility]) CM/SW Contact:  Bethena Roys, RN Phone Number: 12/04/2021, 9:18 AM   Clinical Narrative:  Case Manager received notification from [facility] that they can accept the patient for Home Health PT/CSW. The office will call the patient within 24-48 hours post transition home. No further needs from Case Manager at this time.    Barriers to Discharge: No Barriers Identified   Patient Goals and CMS Choice Patient states their goals for this hospitalization and ongoing recovery are:: home   Choice offered to / list presented to : Patient    Discharge Plan and Services   Discharge Planning Services: CM Consult                Readmission Risk Interventions    08/28/2021   10:31 AM  Readmission Risk Prevention Plan  Transportation Screening Complete  PCP or Specialist Appt within 3-5 Days Complete  HRI or [place] Complete  Social Work Consult for [place] Planning/Counseling Complete  Palliative Care Screening Complete  Medication Review [employer]) Complete

## 2021-12-04 NOTE — Progress Notes (Signed)
Brief ID Note:   D/W Dr. Sloan Leiter - repeated TTE similar appearing compared to last with MV thicking. No changes. Cardiology follow up will be arranged after hospitalization and antibiotic treatment.   Recommend PO Cefpodoxime 200 mg PO BID for 2 weeks of treatment through 6/21.     Janene Madeira, MSN, NP-C [facility] [email or web address] Pager: [phone] Office: [phone] [facility] Main Line: [place] Communication Welcome

## 2021-12-04 NOTE — Discharge Summary (Addendum)
PATIENT DETAILS Name: [name] Age: 71 y.o. Sex: female Date of Birth: October 20, 1950 MRN: [id number]. Admitting Physician: Rise Patience, MD IRC:[id number], Iona Beard, MD  Admit Date: 11/28/2021 Discharge date: 12/04/2021  Recommendations for Outpatient Follow-up:  Follow up with PCP in 1-2 weeks Please obtain CMP/CBC in one week Please ensure follow-up with cardiology.  Admitted From:  Home  Disposition: Home   Discharge Condition: good  CODE STATUS:   Code Status: Full Code   Diet recommendation:  Diet Order             Diet - low sodium heart healthy           Diet Carb Modified           Diet heart healthy/carb modified Room service appropriate? Yes; Fluid consistency: Thin; Fluid restriction: 1200 mL Fluid  Diet effective now                    Brief Summary: Patient is a 71 y.o.  female with history of COPD on 4 L of oxygen at home, HFpEF, PAD, HTN, chronic GI bleeding due to AVMs-presented with fever, cough, shortness of breath-was found to have sepsis due to PNA and subsequently admitted to the hospitalist service.   Significant events: 6/07>> admit to [facility] for sepsis due to PNA   Significant studies: 5/18>> TTE: EF 51-02%, grade 1 diastolic dysfunction 5/85>> CXR: Left PNA 6/12>> Limited TTE: No obvious vegetation-has mitral wall thickening (discussed with primary cardiologist-Dr. Lavone Orn similar thickening in prior echo)   Significant microbiology data: 6/07>> COVID PCR: Negative 6/07>> blood culture: 1/2 strep pneumoniae 6/10>> blood culture: negative   Procedures: None   Consults: ID  Brief Hospital Course: Sepsis due to Streptococcus pneumonia bacteremia and community-acquired pneumonia: Sepsis physiology has resolved-clinically improved-repeat cultures negative-limited echo without any obvious vegetation-reviewed with cardiologist-Dr. Larna Daughters valve thickening was present on prior echo as well.  Very unlikely to have  endocarditis-recommendations from ID and cardiology are to treat with antibiotics x2 weeks-and follow closely in the outpatient setting.  Per ID (discussed on 6/13)-okay to complete a total of 14 days of antibiotics as this is likely from PNA.   AKI: Likely hemodynamically mediated in the setting of sepsis/ARB/hypotension.  Creatinine now has normalized.  COPD: Not in exacerbation-continue bronchodilators.  No indication for steroids at this point.   Chronic hypoxic respiratory failure: On 3-4 L of oxygen at baseline.  Chronic GI bleeding with chronic blood loss anemia: In the setting of AVMs-reviewed prior GI notes-patient has been referred to [facility].  Follow CBC closely and transfuse if Hb<7   Chronic HFpEF: Volume status stable-continue Lasix.   CAD: No anginal symptoms.  Continue aspirin/statin   PAD-s/p right SFA PCI 2014: Continue aspirin/statin   HTN: BP stable-continue metoprolol/furosemide.   Resume losartan/Aldactone on discharge.   MAT/atrial tachycardia: No further episodes-continue beta-blocker   Hypomagnesemia: Repleted.  DM-2 (A1c 4.9 on 3/21): CBGs stable on SSI.  Resume metformin on discharge.  Debility/deconditioning: Evaluated by PT/OT-recommendations are for home health.  Family wanted SNF but per PT-she is currently too functional to go to SNF.  Home health services have been ordered.  Morbid Obesity: Estimated body mass index is 41.63 kg/m as calculated from the following:   Height as of this encounter: 5' (1.524 m).   Weight as of this encounter: 96.7 kg.     Discharge Diagnoses:  Principal Problem:   Sepsis ([place]) Active Problems:   Chronic respiratory failure with hypoxia ([place])  COPD (chronic obstructive pulmonary disease) (H[facility])   Type 2 diabetes mellitus without complication, without long-term current use of insulin (HCC)   CAP (community acquired pneumonia)   Streptococcal pneumonia ([place])   Streptococcal bacteremia   Discharge  Instructions:  Activity:  As tolerated   Discharge Instructions     (HEART FAILURE PATIENTS) Call MD:  Anytime you have any of the following symptoms: 1) 3 pound weight gain in 24 hours or 5 pounds in 1 week 2) shortness of breath, with or without a dry hacking cough 3) swelling in the hands, feet or stomach 4) if you have to sleep on extra pillows at night in order to breathe.   Complete by: As directed    Call MD for:  difficulty breathing, headache or visual disturbances   Complete by: As directed    Diet - low sodium heart healthy   Complete by: As directed    Diet Carb Modified   Complete by: As directed    Discharge instructions   Complete by: As directed    Follow with Primary MD  Benito Mccreedy, MD in 1-2 weeks  Please follow-up with your cardiologist after he completed a course of antibiotics-in around 2 weeks time.  Please get a complete blood count and chemistry panel checked by your Primary MD at your next visit, and again as instructed by your Primary MD.  Get Medicines reviewed and adjusted: Please take all your medications with you for your next visit with your Primary MD  Laboratory/radiological data: Please request your Primary MD to go over all hospital tests and procedure/radiological results at the follow up, please ask your Primary MD to get all Hospital records sent to his/her office.  In some cases, they will be blood work, cultures and biopsy results pending at the time of your discharge. Please request that your primary care M.D. follows up on these results.  Also Note the following: If you experience worsening of your admission symptoms, develop shortness of breath, life threatening emergency, suicidal or homicidal thoughts you must seek medical attention immediately by calling 911 or calling your MD immediately  if symptoms less severe.  You must read complete instructions/literature along with all the possible adverse reactions/side effects for all  the Medicines you take and that have been prescribed to you. Take any new Medicines after you have completely understood and accpet all the possible adverse reactions/side effects.   Do not drive when taking Pain medications or sleeping medications (Benzodaizepines)  Do not take more than prescribed Pain, Sleep and Anxiety Medications. It is not advisable to combine anxiety,sleep and pain medications without talking with your primary care practitioner  Special Instructions: If you have smoked or chewed Tobacco  in the last 2 yrs please stop smoking, stop any regular Alcohol  and or any Recreational drug use.  Wear Seat belts while driving.  Please note: You were cared for by a hospitalist during your hospital stay. Once you are discharged, your primary care physician will handle any further medical issues. Please note that NO REFILLS for any discharge medications will be authorized once you are discharged, as it is imperative that you return to your primary care physician (or establish a relationship with a primary care physician if you do not have one) for your post hospital discharge needs so that they can reassess your need for medications and monitor your lab values.   Increase activity slowly   Complete by: As directed       Allergies  as of 12/04/2021   No Known Allergies      Medication List     STOP taking these medications    predniSONE 10 MG tablet Commonly known as: DELTASONE       TAKE these medications    acetaminophen 500 MG tablet Commonly known as: TYLENOL Take 1,000 mg by mouth daily as needed for moderate pain.   albuterol (2.5 MG/3ML) 0.083% nebulizer solution Commonly known as: PROVENTIL Take 2.5 mg by nebulization every 6 (six) hours as needed for wheezing or shortness of breath. What changed: Another medication with the same name was changed. Make sure you understand how and when to take each.   albuterol 108 (90 Base) MCG/ACT inhaler Commonly known  as: VENTOLIN HFA Inhale 1-2 puffs into the lungs every 6 (six) hours as needed for wheezing or shortness of breath. What changed: how much to take   Aspirin Low Dose 81 MG tablet Generic drug: aspirin EC TAKE 1 TABLET(81 MG) BY MOUTH DAILY What changed: See the new instructions.   atorvastatin 80 MG tablet Commonly known as: LIPITOR Take 80 mg by mouth daily.   Breo Ellipta 100-25 MCG/ACT Aepb Generic drug: fluticasone furoate-vilanterol Inhale 1 puff into the lungs daily.   cefpodoxime 200 MG tablet Commonly known as: VANTIN Take 1 tablet (200 mg total) by mouth 2 (two) times daily.   ezetimibe 10 MG tablet Commonly known as: ZETIA TAKE 1 TABLET BY MOUTH DAILY   ferrous sulfate 325 (65 FE) MG tablet Take 325 mg by mouth daily.   fluticasone 50 MCG/ACT nasal spray Commonly known as: FLONASE Place 1 spray into both nostrils daily as needed for allergies.   furosemide 40 MG tablet Commonly known as: Lasix Take 1 tablet (40 mg total) by mouth daily.   gabapentin 600 MG tablet Commonly known as: NEURONTIN Take 1 tablet (600 mg total) by mouth at bedtime.   ipratropium-albuterol 0.5-2.5 (3) MG/3ML Soln Commonly known as: DUONEB Take 3 mLs by nebulization 3 (three) times daily. What changed:  when to take this reasons to take this   losartan 25 MG tablet Commonly known as: Cozaar Take 1 tablet (25 mg total) by mouth daily.   metFORMIN 500 MG tablet Commonly known as: GLUCOPHAGE Take 0.5 tablets (250 mg total) by mouth 2 (two) times daily with a meal.   metoprolol tartrate 25 MG tablet Commonly known as: LOPRESSOR Take 1 tablet (25 mg total) by mouth 2 (two) times daily.   Myrbetriq 25 MG Tb24 tablet Generic drug: mirabegron ER Take 25 mg by mouth daily.   nitroGLYCERIN 0.4 MG SL tablet Commonly known as: NITROSTAT Place 1 tablet (0.4 mg total) under the tongue every 5 (five) minutes as needed for chest pain.   OXYGEN Inhale 2 L into the lungs  continuous.   pantoprazole 40 MG tablet Commonly known as: PROTONIX Take 1 tablet (40 mg total) by mouth 2 (two) times daily. What changed: when to take this   potassium chloride SA 20 MEQ tablet Commonly known as: KLOR-CON M Take 1 tablet (20 mEq total) by mouth daily.   spironolactone 50 MG tablet Commonly known as: ALDACTONE TAKE 1 TABLET(50 MG) BY MOUTH DAILY What changed: See the new instructions.   VITAMIN D-3 PO Take 1 capsule by mouth 2 (two) times daily.        Follow-up Information     Benito Mccreedy, MD Follow up.   Specialty: Internal Medicine Why: POst hospital follow upo Contact information: [address] [place] [address] [phone]         Care, [facility] Follow up.   Why: Home Health Physical Therapy/Clinical Social Worker-office to call patient with visit hours. Contact information: [place] [address] [phone]         Rex Kras, DO. Schedule an appointment as soon as possible for a visit in 2 week(s).   Specialties: Cardiology, Vascular Surgery Contact information: [place] [address] [phone]                No Known Allergies   Other Procedures/Studies: ECHOCARDIOGRAM LIMITED  Result Date: 12/03/2021    ECHOCARDIOGRAM LIMITED REPORT   Patient Name:   [name] Date of Exam: 12/03/2021 Medical Rec #:  [id number]             Height:       60.0 in Accession #:    [id number]            Weight:       213.2 lb Date of Birth:  09/16/1950             BSA:          1.918 m Patient Age:    86 years              BP:           113/55 mmHg Patient Gender: F                     HR:           68 bpm. Exam Location:  Inpatient Procedure: Limited Echo and Limited Color Doppler Indications:    Bacteremia R78.81  History:        Patient has prior history of Echocardiogram examinations, most                 recent 11/08/2021. CAD, COPD; Risk  Factors:Diabetes,                 Dyslipidemia, Hypertension, GERD and Sleep Apnea.  Sonographer:    Bernadene Person RDCS Referring Phys: [place]  1. Left ventricular ejection fraction, by estimation, is 55 to 60%. The left ventricle has normal function.  2. Right ventricular systolic function is normal. The right ventricular size is normal.  3. Anterior mitral valve leaflet is thickened  4. No large vegetations visualized  5. Consider TEE if suspicion is high for infective endocarditis FINDINGS  Left Ventricle: Left ventricular ejection fraction, by estimation, is 55 to 60%. The left ventricle has normal function. Right Ventricle: The right ventricular size is normal. Right ventricular systolic function is normal. Left Atrium: Left atrial size was normal in size. Right Atrium: Right atrial size was normal in size. Mitral Valve: There is mild thickening of the mitral valve leaflet(s). Phineas Inches Electronically signed by Phineas Inches Signature Date/Time: 12/03/2021/3:55:16 PM    Final    DG CHEST PORT 1 VIEW  Result Date: 11/28/2021 CLINICAL DATA:  Shortness of breath and cough. EXAM: PORTABLE CHEST 1 VIEW COMPARISON:  Chest x-ray 11/09/2021. FINDINGS: Heart is enlarged, unchanged. There are scattered patchy opacities in the left mid and lower lung. The costophrenic angles are clear. No pneumothorax. No acute fractures. IMPRESSION: 1. Patchy left lower lung opacities may represent atelectasis or infection. 2.  Mild cardiomegaly is unchanged. Electronically Signed   By: Ronney Asters M.D.   On: 11/28/2021  22:30   DG CHEST PORT 1 VIEW  Result Date: 11/09/2021 CLINICAL DATA:  Shortness of breath, cough EXAM: PORTABLE CHEST 1 VIEW COMPARISON:  11/07/2021 FINDINGS: Cardiomegaly. No frank interstitial edema. No pleural effusion or pneumothorax. Thoracic aortic atherosclerosis. IMPRESSION: Cardiomegaly. No frank interstitial edema. Electronically Signed   By: Julian Hy M.D.   On:  11/09/2021 02:04   ECHOCARDIOGRAM COMPLETE  Result Date: 11/08/2021    ECHOCARDIOGRAM REPORT   Patient Name:   [name] Date of Exam: 11/08/2021 Medical Rec #:  [id number]             Height:       60.0 in Accession #:    [id number]            Weight:       224.2 lb Date of Birth:  1950/09/07             BSA:          1.959 m Patient Age:    77 years              BP:           118/61 mmHg Patient Gender: F                     HR:           84 bpm. Exam Location:  Inpatient Procedure: 2D Echo, Cardiac Doppler and Color Doppler Indications:    CHF  History:        Patient has no prior history of Echocardiogram examinations and                 Patient has prior history of Echocardiogram examinations, most                 recent 08/29/2021. CAD, COPD; Risk Factors:Hypertension and                 Diabetes.  Sonographer:    Jefferey Pica Referring Phys: [id number] AMRIT ADHIKARI IMPRESSIONS  1. Left ventricular ejection fraction, by estimation, is 55 to 60%. The left ventricle has normal function. The left ventricle has no regional wall motion abnormalities. Left ventricular diastolic parameters are consistent with Grade I diastolic dysfunction (impaired relaxation).  2. Right ventricular systolic function is moderately reduced. The right ventricular size is moderately enlarged. There is severely elevated pulmonary artery systolic pressure.  3. Right atrial size was moderately dilated.  4. The mitral valve is abnormal. Trivial mitral valve regurgitation. No evidence of mitral stenosis.  5. Tricuspid valve regurgitation is mild to moderate.  6. Gradients stable to slightly lower compared to TTE done 08/29/21. The aortic valve is tricuspid. There is moderate calcification of the aortic valve. There is moderate thickening of the aortic valve. Aortic valve regurgitation is not visualized. Mild aortic valve stenosis.  7. Aortic dilatation noted. There is dilatation of the ascending aorta, measuring 43 mm.  8. The  inferior vena cava is dilated in size with >50% respiratory variability, suggesting right atrial pressure of 8 mmHg. FINDINGS  Left Ventricle: Left ventricular ejection fraction, by estimation, is 55 to 60%. The left ventricle has normal function. The left ventricle has no regional wall motion abnormalities. The left ventricular internal cavity size was normal in size. There is  no left ventricular hypertrophy. Left ventricular diastolic parameters are consistent with Grade I diastolic dysfunction (impaired relaxation). Right Ventricle: The right ventricular size is moderately enlarged. No increase in right ventricular  wall thickness. Right ventricular systolic function is moderately reduced. There is severely elevated pulmonary artery systolic pressure. The tricuspid regurgitant velocity is 3.37 m/s, and with an assumed right atrial pressure of 15 mmHg, the estimated right ventricular systolic pressure is 31.5 mmHg. Left Atrium: Left atrial size was normal in size. Right Atrium: Right atrial size was moderately dilated. Pericardium: There is no evidence of pericardial effusion. Mitral Valve: The mitral valve is abnormal. There is mild thickening of the mitral valve leaflet(s). There is mild calcification of the mitral valve leaflet(s). Trivial mitral valve regurgitation. No evidence of mitral valve stenosis. Tricuspid Valve: The tricuspid valve is normal in structure. Tricuspid valve regurgitation is mild to moderate. No evidence of tricuspid stenosis. Aortic Valve: Gradients stable to slightly lower compared to TTE done 08/29/21. The aortic valve is tricuspid. There is moderate calcification of the aortic valve. There is moderate thickening of the aortic valve. Aortic valve regurgitation is not visualized. Mild aortic stenosis is present. Aortic valve mean gradient measures 12.0 mmHg. Aortic valve peak gradient measures 16.1 mmHg. Aortic valve area, by VTI measures 1.69 cm. Pulmonic Valve: The pulmonic valve was  normal in structure. Pulmonic valve regurgitation is mild. No evidence of pulmonic stenosis. Aorta: Aortic dilatation noted. There is dilatation of the ascending aorta, measuring 43 mm. Venous: The inferior vena cava is dilated in size with greater than 50% respiratory variability, suggesting right atrial pressure of 8 mmHg. IAS/Shunts: The interatrial septum was not well visualized.  LEFT VENTRICLE PLAX 2D LVIDd:         4.20 cm   Diastology LVIDs:         3.05 cm   LV e' medial:    5.57 cm/s LV PW:         1.10 cm   LV E/e' medial:  14.1 LV IVS:        0.95 cm   LV e' lateral:   7.72 cm/s LVOT diam:     1.80 cm   LV E/e' lateral: 10.2 LV SV:         79 LV SV Index:   41 LVOT Area:     2.54 cm  RIGHT VENTRICLE             IVC RV S prime:     12.10 cm/s  IVC diam: 2.50 cm TAPSE (M-mode): 2.0 cm LEFT ATRIUM             Index        RIGHT ATRIUM           Index LA diam:        3.40 cm 1.74 cm/m   RA Area:     12.90 cm LA Vol (A2C):   39.4 ml 20.11 ml/m  RA Volume:   31.90 ml  16.28 ml/m LA Vol (A4C):   44.5 ml 22.71 ml/m LA Biplane Vol: 42.2 ml 21.54 ml/m  AORTIC VALVE                     PULMONIC VALVE AV Area (Vmax):    1.98 cm      PV Vmax:       1.17 m/s AV Area (Vmean):   1.76 cm      PV Peak grad:  5.5 mmHg AV Area (VTI):     1.69 cm AV Vmax:           200.68 cm/s AV Vmean:          159.000  cm/s AV VTI:            0.470 m AV Peak Grad:      16.1 mmHg AV Mean Grad:      12.0 mmHg LVOT Vmax:         156.00 cm/s LVOT Vmean:        110.000 cm/s LVOT VTI:          0.312 m LVOT/AV VTI ratio: 0.66  AORTA Ao Root diam: 3.25 cm Ao Asc diam:  4.30 cm MITRAL VALVE                TRICUSPID VALVE MV Area (PHT): 3.37 cm     TR Peak grad:   45.4 mmHg MV Decel Time: 225 msec     TR Vmax:        337.00 cm/s MV E velocity: 78.60 cm/s MV A velocity: 138.00 cm/s  SHUNTS MV E/A ratio:  0.57         Systemic VTI:  0.31 m                             Systemic Diam: 1.80 cm Jenkins Rouge MD Electronically signed by Jenkins Rouge MD Signature Date/Time: 11/08/2021/2:23:15 PM    Final    DG Chest 2 View  Result Date: 11/07/2021 CLINICAL DATA:  Shortness of breath EXAM: CHEST - 2 VIEW COMPARISON:  Chest radiograph dated 09/21/2021. FINDINGS: The heart is enlarged. Vascular calcifications are seen in the aortic arch. Moderate bilateral lower lung predominant interstitial opacities are noted. There is no pleural effusion or pneumothorax. Degenerative changes are seen in the spine. IMPRESSION: Cardiomegaly and bilateral interstitial opacities may represent congestive heart failure. Aortic Atherosclerosis (ICD10-I70.0). Electronically Signed   By: Zerita Boers M.D.   On: 11/07/2021 19:35     TODAY-DAY OF DISCHARGE:  Subjective:   [name] today has no headache,no chest abdominal pain,no new weakness tingling or numbness, feels much better wants to go home today.  Objective:   Blood pressure (!) 125/50, pulse 92, temperature 99.2 F (37.3 C), temperature source Oral, resp. rate 20, height 5' (1.524 m), weight 96.5 kg, SpO2 100 %.  Intake/Output Summary (Last 24 hours) at 12/04/2021 1018 Last data filed at 12/04/2021 0900 Gross per 24 hour  Intake 840 ml  Output 1600 ml  Net -760 ml   Filed Weights   12/02/21 0500 12/03/21 0400 12/04/21 0402  Weight: 97.9 kg 96.7 kg 96.5 kg    Exam: Awake Alert, Oriented *3, No new F.N deficits, Normal affect [place].AT,PERRAL Supple Neck,No JVD, No cervical lymphadenopathy appriciated.  Symmetrical Chest wall movement, Good air movement bilaterally, CTAB RRR,No Gallops,Rubs or new Murmurs, No Parasternal Heave +ve B.Sounds, Abd Soft, Non tender, No organomegaly appriciated, No rebound -guarding or rigidity. No Cyanosis, Clubbing or edema, No new Rash or bruise   PERTINENT RADIOLOGIC STUDIES: ECHOCARDIOGRAM LIMITED  Result Date: 12/03/2021    ECHOCARDIOGRAM LIMITED REPORT   Patient Name:   [name] Date of Exam: 12/03/2021 Medical Rec #:  [id number]              Height:       60.0 in Accession #:    [id number]            Weight:       213.2 lb Date of Birth:  August 10, 1950             BSA:  1.918 m Patient Age:    78 years              BP:           113/55 mmHg Patient Gender: F                     HR:           68 bpm. Exam Location:  Inpatient Procedure: Limited Echo and Limited Color Doppler Indications:    Bacteremia R78.81  History:        Patient has prior history of Echocardiogram examinations, most                 recent 11/08/2021. CAD, COPD; Risk Factors:Diabetes,                 Dyslipidemia, Hypertension, GERD and Sleep Apnea.  Sonographer:    Bernadene Person RDCS Referring Phys: [place]  1. Left ventricular ejection fraction, by estimation, is 55 to 60%. The left ventricle has normal function.  2. Right ventricular systolic function is normal. The right ventricular size is normal.  3. Anterior mitral valve leaflet is thickened  4. No large vegetations visualized  5. Consider TEE if suspicion is high for infective endocarditis FINDINGS  Left Ventricle: Left ventricular ejection fraction, by estimation, is 55 to 60%. The left ventricle has normal function. Right Ventricle: The right ventricular size is normal. Right ventricular systolic function is normal. Left Atrium: Left atrial size was normal in size. Right Atrium: Right atrial size was normal in size. Mitral Valve: There is mild thickening of the mitral valve leaflet(s). Phineas Inches Electronically signed by Phineas Inches Signature Date/Time: 12/03/2021/3:55:16 PM    Final      PERTINENT LAB RESULTS: CBC: Recent Labs    12/02/21 0030 12/03/21 0134  WBC 11.8* 13.3*  HGB 7.3* 7.6*  HCT 27.1* 28.2*  PLT 430* 537*   CMET CMP     Component Value Date/Time   NA 138 12/04/2021 0207   NA 138 03/26/2021 1328   K 3.7 12/04/2021 0207   CL 103 12/04/2021 0207   CO2 29 12/04/2021 0207   GLUCOSE 131 (H) 12/04/2021 0207   BUN 7 (L) 12/04/2021 0207   BUN 12 03/26/2021  1328   CREATININE 0.76 12/04/2021 0207   CALCIUM 8.6 (L) 12/04/2021 0207   PROT 6.6 11/28/2021 2223   PROT 7.5 03/26/2021 1328   ALBUMIN 2.5 (L) 11/28/2021 2223   ALBUMIN 4.5 03/26/2021 1328   AST 32 11/28/2021 2223   ALT 18 11/28/2021 2223   ALKPHOS 59 11/28/2021 2223   BILITOT 0.7 11/28/2021 2223   BILITOT 0.3 03/26/2021 1328   GFRNONAA >60 12/04/2021 0207   GFRAA 75 07/23/2019 0930    GFR Estimated Creatinine Clearance: 67.1 mL/min (by C-G formula based on SCr of 0.76 mg/dL). No results for input(s): "LIPASE", "AMYLASE" in the last 72 hours. No results for input(s): "CKTOTAL", "CKMB", "CKMBINDEX", "TROPONINI" in the last 72 hours. Invalid input(s): "POCBNP" No results for input(s): "DDIMER" in the last 72 hours. No results for input(s): "HGBA1C" in the last 72 hours. No results for input(s): "CHOL", "HDL", "LDLCALC", "TRIG", "CHOLHDL", "LDLDIRECT" in the last 72 hours. No results for input(s): "TSH", "T4TOTAL", "T3FREE", "THYROIDAB" in the last 72 hours.  Invalid input(s): "FREET3" No results for input(s): "VITAMINB12", "FOLATE", "FERRITIN", "TIBC", "IRON", "RETICCTPCT" in the last 72 hours. Coags: No results for input(s): "INR" in the last 72 hours.  Invalid  input(s): "PT" Microbiology: Recent Results (from the past 240 hour(s))  Culture, blood (Routine x 2)     Status: Abnormal   Collection Time: 11/28/21 10:23 PM   Specimen: BLOOD  Result Value Ref Range Status   Specimen Description BLOOD RIGHT ANTECUBITAL  Final   Special Requests   Final    BOTTLES DRAWN AEROBIC AND ANAEROBIC Blood Culture adequate volume   Culture  Setup Time   Final    GRAM POSITIVE COCCI IN CHAINS AEROBIC BOTTLE ONLY CRITICAL RESULT CALLED TO, READ BACK BY AND VERIFIED WITH: PHARMD CARLA JARDIN ON 11/29/21 @ 1606 BY DRT Performed at [facility], [place] [address] [place], [place] [address]    Culture STREPTOCOCCUS PNEUMONIAE (A)  Final   Report Status 12/01/2021 FINAL  Final   Organism  ID, Bacteria STREPTOCOCCUS PNEUMONIAE  Final      Susceptibility   Streptococcus pneumoniae - MIC*    ERYTHROMYCIN <=0.12 SENSITIVE Sensitive     LEVOFLOXACIN 0.5 SENSITIVE Sensitive     VANCOMYCIN 0.5 SENSITIVE Sensitive     PENICILLIN (meningitis) <=0.06 SENSITIVE Sensitive     PENO - penicillin <=0.06      PENICILLIN (non-meningitis) <=0.06 SENSITIVE Sensitive     PENICILLIN (oral) <=0.06 SENSITIVE Sensitive     CEFTRIAXONE (non-meningitis) <=0.12 SENSITIVE Sensitive     CEFTRIAXONE (meningitis) <=0.12 SENSITIVE Sensitive     * STREPTOCOCCUS PNEUMONIAE  Blood Culture ID Panel (Reflexed)     Status: Abnormal   Collection Time: 11/28/21 10:23 PM  Result Value Ref Range Status   Enterococcus faecalis NOT DETECTED NOT DETECTED Final   Enterococcus Faecium NOT DETECTED NOT DETECTED Final   Listeria monocytogenes NOT DETECTED NOT DETECTED Final   Staphylococcus species NOT DETECTED NOT DETECTED Final   Staphylococcus aureus (BCID) NOT DETECTED NOT DETECTED Final   Staphylococcus epidermidis NOT DETECTED NOT DETECTED Final   Staphylococcus lugdunensis NOT DETECTED NOT DETECTED Final   Streptococcus species DETECTED (A) NOT DETECTED Final    Comment: CRITICAL RESULT CALLED TO, READ BACK BY AND VERIFIED WITH: PHARMD CARLA JARDIN ON 11/29/21 @ 1606 BY DRT    Streptococcus agalactiae NOT DETECTED NOT DETECTED Final   Streptococcus pneumoniae DETECTED (A) NOT DETECTED Final    Comment: CRITICAL RESULT CALLED TO, READ BACK BY AND VERIFIED WITH: PHARMD CARLA JARDIN ON 11/29/21 @ 1606 BY DRT    Streptococcus pyogenes NOT DETECTED NOT DETECTED Final   A.calcoaceticus-baumannii NOT DETECTED NOT DETECTED Final   Bacteroides fragilis NOT DETECTED NOT DETECTED Final   Enterobacterales NOT DETECTED NOT DETECTED Final   Enterobacter cloacae complex NOT DETECTED NOT DETECTED Final   Escherichia coli NOT DETECTED NOT DETECTED Final   Klebsiella aerogenes NOT DETECTED NOT DETECTED Final   Klebsiella  oxytoca NOT DETECTED NOT DETECTED Final   Klebsiella pneumoniae NOT DETECTED NOT DETECTED Final   Proteus species NOT DETECTED NOT DETECTED Final   Salmonella species NOT DETECTED NOT DETECTED Final   Serratia marcescens NOT DETECTED NOT DETECTED Final   Haemophilus influenzae NOT DETECTED NOT DETECTED Final   Neisseria meningitidis NOT DETECTED NOT DETECTED Final   Pseudomonas aeruginosa NOT DETECTED NOT DETECTED Final   Stenotrophomonas maltophilia NOT DETECTED NOT DETECTED Final   Candida albicans NOT DETECTED NOT DETECTED Final   Candida auris NOT DETECTED NOT DETECTED Final   Candida glabrata NOT DETECTED NOT DETECTED Final   Candida krusei NOT DETECTED NOT DETECTED Final   Candida parapsilosis NOT DETECTED NOT DETECTED Final   Candida tropicalis NOT  DETECTED NOT DETECTED Final   Cryptococcus neoformans/gattii NOT DETECTED NOT DETECTED Final    Comment: Performed at [facility], [place] [address], [place], [place] [address]  Culture, blood (Routine x 2)     Status: None   Collection Time: 11/28/21 10:25 PM   Specimen: BLOOD RIGHT HAND  Result Value Ref Range Status   Specimen Description BLOOD RIGHT HAND  Final   Special Requests   Final    BOTTLES DRAWN AEROBIC AND ANAEROBIC Blood Culture results may not be optimal due to an excessive volume of blood received in culture bottles   Culture   Final    NO GROWTH 5 DAYS Performed at [facility], [place] [address] [place], [place] [address]    Report Status 12/03/2021 FINAL  Final  SARS Coronavirus 2 by RT PCR (hospital order, performed in [facility] hospital lab) *cepheid single result test* Anterior Nasal Swab     Status: None   Collection Time: 11/28/21 10:25 PM   Specimen: Anterior Nasal Swab  Result Value Ref Range Status   SARS Coronavirus 2 by RT PCR NEGATIVE NEGATIVE Final    Comment: (NOTE) SARS-CoV-2 target nucleic acids are NOT DETECTED.  The SARS-CoV-2 RNA is generally detectable in upper and  lower respiratory specimens during the acute phase of infection. The lowest concentration of SARS-CoV-2 viral copies this assay can detect is 250 copies / mL. A negative result does not preclude SARS-CoV-2 infection and should not be used as the sole basis for treatment or other patient management decisions.  A negative result may occur with improper specimen collection / handling, submission of specimen other than nasopharyngeal swab, presence of viral mutation(s) within the areas targeted by this assay, and inadequate number of viral copies (<250 copies / mL). A negative result must be combined with clinical observations, patient history, and epidemiological information.  Fact Sheet for Patients:   https://www.patel.info/  Fact Sheet for Healthcare Providers: https://hall.com/  This test is not yet approved or  cleared by the [place] FDA and has been authorized for detection and/or diagnosis of SARS-CoV-2 by FDA under an Emergency Use Authorization (EUA).  This EUA will remain in effect (meaning this test can be used) for the duration of the COVID-19 declaration under Section 564(b)(1) of the Act, 21 U.S.C. section 360bbb-3(b)(1), unless the authorization is terminated or revoked sooner.  Performed at [facility], [place] [address], [place], [place] [address]   Resp Panel by RT-PCR (Flu A&B, Covid) Anterior Nasal Swab     Status: None   Collection Time: 11/28/21 10:34 PM   Specimen: Anterior Nasal Swab  Result Value Ref Range Status   SARS Coronavirus 2 by RT PCR NEGATIVE NEGATIVE Final    Comment: (NOTE) SARS-CoV-2 target nucleic acids are NOT DETECTED.  The SARS-CoV-2 RNA is generally detectable in upper respiratory specimens during the acute phase of infection. The lowest concentration of SARS-CoV-2 viral copies this assay can detect is 138 copies/mL. A negative result does not preclude SARS-Cov-2 infection and should  not be used as the sole basis for treatment or other patient management decisions. A negative result may occur with  improper specimen collection/handling, submission of specimen other than nasopharyngeal swab, presence of viral mutation(s) within the areas targeted by this assay, and inadequate number of viral copies(<138 copies/mL). A negative result must be combined with clinical observations, patient history, and epidemiological information. The expected result is Negative.  Fact Sheet for Patients:  EntrepreneurPulse.com.au  Fact Sheet for Healthcare Providers:  IncredibleEmployment.be  This test is no t yet approved or cleared by the [place] FDA and  has been authorized for detection and/or diagnosis of SARS-CoV-2 by FDA under an Emergency Use Authorization (EUA). This EUA will remain  in effect (meaning this test can be used) for the duration of the COVID-19 declaration under Section 564(b)(1) of the Act, 21 U.S.C.section 360bbb-3(b)(1), unless the authorization is terminated  or revoked sooner.       Influenza A by PCR NEGATIVE NEGATIVE Final   Influenza B by PCR NEGATIVE NEGATIVE Final    Comment: (NOTE) The Xpert Xpress SARS-CoV-2/FLU/RSV plus assay is intended as an aid in the diagnosis of influenza from Nasopharyngeal swab specimens and should not be used as a sole basis for treatment. Nasal washings and aspirates are unacceptable for Xpert Xpress SARS-CoV-2/FLU/RSV testing.  Fact Sheet for Patients: EntrepreneurPulse.com.au  Fact Sheet for Healthcare Providers: IncredibleEmployment.be  This test is not yet approved or cleared by the [place] FDA and has been authorized for detection and/or diagnosis of SARS-CoV-2 by FDA under an Emergency Use Authorization (EUA). This EUA will remain in effect (meaning this test can be used) for the duration of the COVID-19 declaration under  Section 564(b)(1) of the Act, 21 U.S.C. section 360bbb-3(b)(1), unless the authorization is terminated or revoked.  Performed at [facility], [place] [address] [place], [place] [address]   Urine Culture     Status: None   Collection Time: 11/29/21  3:43 PM   Specimen: In/Out Cath Urine  Result Value Ref Range Status   Specimen Description IN/OUT CATH URINE  Final   Special Requests NONE  Final   Culture   Final    NO GROWTH Performed at [facility], [place] [address] [place], [place] [address]    Report Status 11/30/2021 FINAL  Final  Culture, blood (Routine X 2) w Reflex to ID Panel     Status: None (Preliminary result)   Collection Time: 12/01/21  9:01 AM   Specimen: BLOOD  Result Value Ref Range Status   Specimen Description BLOOD LEFT ANTECUBITAL  Final   Special Requests   Final    BOTTLES DRAWN AEROBIC AND ANAEROBIC Blood Culture adequate volume   Culture   Final    NO GROWTH 2 DAYS Performed at [facility], [place] [address] [place], [place] [address]    Report Status PENDING  Incomplete  Culture, blood (Routine X 2) w Reflex to ID Panel     Status: None (Preliminary result)   Collection Time: 12/01/21  9:09 AM   Specimen: BLOOD LEFT HAND  Result Value Ref Range Status   Specimen Description BLOOD LEFT HAND  Final   Special Requests   Final    BOTTLES DRAWN AEROBIC AND ANAEROBIC Blood Culture adequate volume   Culture   Final    NO GROWTH 2 DAYS Performed at [facility], [place] [address], [place], [place] [address]    Report Status PENDING  Incomplete    FURTHER DISCHARGE INSTRUCTIONS:  Get Medicines reviewed and adjusted: Please take all your medications with you for your next visit with your Primary MD  Laboratory/radiological data: Please request your Primary MD to go over all hospital tests and procedure/radiological results at the follow up, please ask your Primary MD to get all Hospital records sent to his/her office.  In some  cases, they will be blood work, cultures and biopsy results pending  at the time of your discharge. Please request that your primary care M.D. goes through all the records of your hospital data and follows up on these results.  Also Note the following: If you experience worsening of your admission symptoms, develop shortness of breath, life threatening emergency, suicidal or homicidal thoughts you must seek medical attention immediately by calling 911 or calling your MD immediately  if symptoms less severe.  You must read complete instructions/literature along with all the possible adverse reactions/side effects for all the Medicines you take and that have been prescribed to you. Take any new Medicines after you have completely understood and accpet all the possible adverse reactions/side effects.   Do not drive when taking Pain medications or sleeping medications (Benzodaizepines)  Do not take more than prescribed Pain, Sleep and Anxiety Medications. It is not advisable to combine anxiety,sleep and pain medications without talking with your primary care practitioner  Special Instructions: If you have smoked or chewed Tobacco  in the last 2 yrs please stop smoking, stop any regular Alcohol  and or any Recreational drug use.  Wear Seat belts while driving.  Please note: You were cared for by a hospitalist during your hospital stay. Once you are discharged, your primary care physician will handle any further medical issues. Please note that NO REFILLS for any discharge medications will be authorized once you are discharged, as it is imperative that you return to your primary care physician (or establish a relationship with a primary care physician if you do not have one) for your post hospital discharge needs so that they can reassess your need for medications and monitor your lab values.  Total Time spent coordinating discharge including counseling, education and face to face time equals greater than  30 minutes.  SignedOren Binet 12/04/2021 10:18 AM

## 2021-12-06 ENCOUNTER — Ambulatory Visit: Payer: Medicare Other | Admitting: Student

## 2021-12-06 ENCOUNTER — Ambulatory Visit: Payer: Medicare Other | Admitting: Cardiology

## 2021-12-06 ENCOUNTER — Encounter: Payer: Self-pay | Admitting: Student

## 2021-12-06 VITALS — BP 111/56 | HR 60 | Temp 98.2°F | Resp 16 | Ht 60.0 in | Wt 212.8 lb

## 2021-12-06 DIAGNOSIS — R600 Localized edema: Secondary | ICD-10-CM | POA: Diagnosis not present

## 2021-12-06 DIAGNOSIS — J441 Chronic obstructive pulmonary disease with (acute) exacerbation: Secondary | ICD-10-CM | POA: Diagnosis not present

## 2021-12-06 DIAGNOSIS — I739 Peripheral vascular disease, unspecified: Secondary | ICD-10-CM

## 2021-12-06 DIAGNOSIS — I6523 Occlusion and stenosis of bilateral carotid arteries: Secondary | ICD-10-CM

## 2021-12-06 DIAGNOSIS — I2510 Atherosclerotic heart disease of native coronary artery without angina pectoris: Secondary | ICD-10-CM | POA: Diagnosis not present

## 2021-12-06 LAB — CULTURE, BLOOD (ROUTINE X 2)
Culture: NO GROWTH
Culture: NO GROWTH
Special Requests: ADEQUATE
Special Requests: ADEQUATE

## 2021-12-06 NOTE — Progress Notes (Signed)
[name] Date of Birth: 1950/07/13 MRN: [id number] Primary Care Provider:Osei-Bonsu, Iona Beard, MD Former Cardiology Providers: Jeri Lager, APRN, FNP-C Primary Cardiologist: Alethia Berthold, PA-C, [facility] (established care 08/30/2019)  Date: 12/06/21 Last Office Visit: 08/14/2021  No chief complaint on file.   HPI  [name] is a 71 y.o.  female whose past medical history and cardiovascular risk factors include: Established coronary artery disease without angina pectoris, hypertension, hyperlipidemia, former smoker, peripheral artery disease status post PV angiogram with revascularization of the SFA, non-insulin-dependent diabetes, postmenopausal female, advanced age, obesity due to excess calories, history of prior GI bleed, COPD on oxygen via nasal cannula  Patient being followed by the practice for management of CAD, carotid artery disease, and PAD.    Patient was last seen in the office 09/10/2021 at which time increase cilostazol from 50 mg to 100 mg p.o. twice daily. Since last office visit patient has had multiple hospitalizations.  First 11/08/2021 - 11/13/2021 with acute on chronic anemia due to upper GI bleed, at that time cilostazol as well as amlodipine and hydralazine were discontinued.  Patient was again admitted 11/29/2021 - 12/04/2021 with sepsis secondary to pneumonia as well as AKI.  During hospitalization there was some concern that echocardiogram revealed thickening of the mitral valve, however this was personally reviewed by Dr. Terri Skains and is unchanged compared to previous, no evidence of endocarditis.  Patient was discharged on 2 weeks of antibiotics with follow-up with PCP and pulmonology.  From a cardiac standpoint patient was most recently discharged on the following: Aspirin, atorvastatin, Zetia, furosemide 40 mg p.o. daily, losartan, metoprolol tartrate, spironolactone, potassium, as well as as needed nitroglycerin.  Patient now presents for follow-up  with her daughter present at bedside.  Patient reports general fatigue as well as ongoing shortness of breath since discharge.  Reports symptoms of claudication are stable although physical activity is currently limited.  Denies orthopnea, PND.  She does have bilateral leg edema.  Patient admits to high sodium intake.   ALLERGIES: No Known Allergies  MEDICATION LIST PRIOR TO VISIT: Current Outpatient Medications on File Prior to Visit  Medication Sig Dispense Refill   acetaminophen (TYLENOL) 500 MG tablet Take 1,000 mg by mouth daily as needed for moderate pain.     albuterol (PROVENTIL) (2.5 MG/3ML) 0.083% nebulizer solution Take 2.5 mg by nebulization every 6 (six) hours as needed for wheezing or shortness of breath.     albuterol (VENTOLIN HFA) 108 (90 Base) MCG/ACT inhaler Inhale 1-2 puffs into the lungs every 6 (six) hours as needed for wheezing or shortness of breath. (Patient taking differently: Inhale 2 puffs into the lungs every 6 (six) hours as needed for wheezing or shortness of breath.) 8.5 g 1   ASPIRIN LOW DOSE 81 MG EC tablet TAKE 1 TABLET(81 MG) BY MOUTH DAILY (Patient taking differently: Take 81 mg by mouth daily.) 30 tablet 0   atorvastatin (LIPITOR) 80 MG tablet Take 80 mg by mouth daily.     cefpodoxime (VANTIN) 200 MG tablet Take 1 tablet (200 mg total) by mouth 2 (two) times daily. 16 tablet 0   Cholecalciferol (VITAMIN D-3 PO) Take 1 capsule by mouth 2 (two) times daily.     ezetimibe (ZETIA) 10 MG tablet TAKE 1 TABLET BY MOUTH DAILY (Patient taking differently: Take 10 mg by mouth daily.) 90 tablet 3   ferrous sulfate 325 (65 FE) MG tablet Take 325 mg by mouth daily.     fluticasone (FLONASE) 50 MCG/ACT nasal  spray Place 1 spray into both nostrils daily as needed for allergies.     fluticasone furoate-vilanterol (BREO ELLIPTA) 100-25 MCG/ACT AEPB Inhale 1 puff into the lungs daily. 60 each 1   furosemide (LASIX) 40 MG tablet Take 1 tablet (40 mg total) by mouth daily. 30  tablet 1   gabapentin (NEURONTIN) 600 MG tablet Take 1 tablet (600 mg total) by mouth at bedtime. 30 tablet 11   ipratropium-albuterol (DUONEB) 0.5-2.5 (3) MG/3ML SOLN Take 3 mLs by nebulization 3 (three) times daily. (Patient taking differently: Take 3 mLs by nebulization 3 (three) times daily as needed (wheezing).) 270 mL 1   losartan (COZAAR) 25 MG tablet Take 1 tablet (25 mg total) by mouth daily. 30 tablet 0   metFORMIN (GLUCOPHAGE) 500 MG tablet Take 0.5 tablets (250 mg total) by mouth 2 (two) times daily with a meal.     metoprolol tartrate (LOPRESSOR) 25 MG tablet Take 1 tablet (25 mg total) by mouth 2 (two) times daily. 60 tablet 11   MYRBETRIQ 25 MG TB24 tablet Take 25 mg by mouth daily.     nitroGLYCERIN (NITROSTAT) 0.4 MG SL tablet Place 1 tablet (0.4 mg total) under the tongue every 5 (five) minutes as needed for chest pain. 90 tablet 3   OXYGEN Inhale 2 L into the lungs continuous.     pantoprazole (PROTONIX) 40 MG tablet Take 1 tablet (40 mg total) by mouth 2 (two) times daily. (Patient taking differently: Take 40 mg by mouth daily.) 60 tablet 0   potassium chloride SA (KLOR-CON M) 20 MEQ tablet Take 1 tablet (20 mEq total) by mouth daily. 30 tablet 0   spironolactone (ALDACTONE) 50 MG tablet TAKE 1 TABLET(50 MG) BY MOUTH DAILY (Patient taking differently: Take 50 mg by mouth daily.) 90 tablet 3   No current facility-administered medications on file prior to visit.    PAST MEDICAL HISTORY: Past Medical History:  Diagnosis Date   Anemia 12/26/2015   Anxiety    Arthritis    Blood transfusion without reported diagnosis    Chronic pain    resolved per pt 04/12/20   COPD (chronic obstructive pulmonary disease) (HCC)    Coronary artery disease    Depression    Diabetes mellitus without complication (HCC)    type 2   GERD (gastroesophageal reflux disease)    Heart murmur    since birth; Echo 02/18/19: LVEF 16%, grade 1 diastolic dysfunction, mild AS (mean grad 12 mmHg), trace  MR/PR, mild TR, PASP 32 mmHg     Hyperlipemia    Hypertension    PVD (peripheral vascular disease) ([place])    right SFA stent 02/11/17 by Dr. Einar Gip   Sleep apnea    does not use cpap   Wears glasses     PAST SURGICAL HISTORY: Past Surgical History:  Procedure Laterality Date   ABDOMINAL HYSTERECTOMY     CARDIAC CATHETERIZATION     CATARACT EXTRACTION     CESAREAN SECTION     COLONOSCOPY N/A 01/08/2013   Procedure: COLONOSCOPY;  Surgeon: Beryle Beams, MD;  Location: [facility] ENDOSCOPY;  Service: Endoscopy;  Laterality: N/A;   COLONOSCOPY N/A 12/28/2015   Procedure: COLONOSCOPY;  Surgeon: Carol Ada, MD;  Location: [place];  Service: Endoscopy;  Laterality: N/A;   COLONOSCOPY WITH PROPOFOL N/A 10/05/2020   Procedure: COLONOSCOPY WITH PROPOFOL;  Surgeon: Carol Ada, MD;  Location: [facility];  Service: Endoscopy;  Laterality: N/A;   ENTEROSCOPY N/A 12/28/2015   Procedure: ENTEROSCOPY;  Surgeon:  Carol Ada, MD;  Location: [facility] ENDOSCOPY;  Service: Endoscopy;  Laterality: N/A;   ENTEROSCOPY N/A 02/20/2018   Procedure: ENTEROSCOPY;  Surgeon: Carol Ada, MD;  Location: [facility];  Service: Endoscopy;  Laterality: N/A;   ENTEROSCOPY N/A 03/27/2018   Procedure: ENTEROSCOPY;  Surgeon: Carol Ada, MD;  Location: [facility];  Service: Endoscopy;  Laterality: N/A;   ENTEROSCOPY N/A 10/05/2020   Procedure: ENTEROSCOPY;  Surgeon: Carol Ada, MD;  Location: [facility];  Service: Endoscopy;  Laterality: N/A;   ENTEROSCOPY N/A 05/11/2021   Procedure: ENTEROSCOPY;  Surgeon: Carol Ada, MD;  Location: [facility];  Service: Endoscopy;  Laterality: N/A;   ENTEROSCOPY N/A 09/23/2021   Procedure: ENTEROSCOPY;  Surgeon: Doran Stabler, MD;  Location: [place];  Service: Gastroenterology;  Laterality: N/A;   ESOPHAGOGASTRODUODENOSCOPY N/A 01/08/2013   Procedure: ESOPHAGOGASTRODUODENOSCOPY (EGD);  Surgeon: Beryle Beams, MD;  Location: [facility];  Service: Endoscopy;  Laterality:  N/A;   EYE SURGERY     GIVENS CAPSULE STUDY N/A 09/24/2021   Procedure: GIVENS CAPSULE STUDY;  Surgeon: Carol Ada, MD;  Location: [place];  Service: Gastroenterology;  Laterality: N/A;   HAND SURGERY     HEMOSTASIS CLIP PLACEMENT  05/11/2021   Procedure: HEMOSTASIS CLIP PLACEMENT;  Surgeon: Carol Ada, MD;  Location: [facility];  Service: Endoscopy;;   HOT HEMOSTASIS N/A 12/28/2015   Procedure: HOT HEMOSTASIS (ARGON PLASMA COAGULATION/BICAP);  Surgeon: Carol Ada, MD;  Location: [facility] ENDOSCOPY;  Service: Endoscopy;  Laterality: N/A;   HOT HEMOSTASIS N/A 02/20/2018   Procedure: HOT HEMOSTASIS (ARGON PLASMA COAGULATION/BICAP);  Surgeon: Carol Ada, MD;  Location: [facility];  Service: Endoscopy;  Laterality: N/A;   HOT HEMOSTASIS N/A 03/27/2018   Procedure: HOT HEMOSTASIS (ARGON PLASMA COAGULATION/BICAP);  Surgeon: Carol Ada, MD;  Location: [facility];  Service: Endoscopy;  Laterality: N/A;   HOT HEMOSTASIS N/A 10/05/2020   Procedure: HOT HEMOSTASIS (ARGON PLASMA COAGULATION/BICAP);  Surgeon: Carol Ada, MD;  Location: [facility];  Service: Endoscopy;  Laterality: N/A;   HOT HEMOSTASIS N/A 05/11/2021   Procedure: HOT HEMOSTASIS (ARGON PLASMA COAGULATION/BICAP);  Surgeon: Carol Ada, MD;  Location: [facility];  Service: Endoscopy;  Laterality: N/A;   LEFT HEART CATHETERIZATION WITH CORONARY ANGIOGRAM N/A 03/09/2013   Procedure: LEFT HEART CATHETERIZATION WITH CORONARY ANGIOGRAM;  Surgeon: Laverda Page, MD;  [facility] C[facility] LAB;  Service: Cardiovascular;  Laterality: N/A;   LOWER EXTREMITY ANGIOGRAM N/A 12/29/2012   Procedure: LOWER EXTREMITY ANGIOGRAM;  Surgeon: Laverda Page, MD;  Location: [facility] CATH LAB;  Service: Cardiovascular;  Laterality: N/A;   LOWER EXTREMITY ANGIOGRAM N/A 10/05/2013   Procedure: LOWER EXTREMITY ANGIOGRAM;  Surgeon: Laverda Page, MD;  Location: [facility] CATH LAB;  Service: Cardiovascular;  Laterality: N/A;   LOWER EXTREMITY ANGIOGRAPHY N/A  02/11/2017   Procedure: Lower Extremity Angiography;  Surgeon: Adrian Prows, MD;  Location: [place] CV LAB;  Service: Cardiovascular;  Laterality: N/A;   PERIPHERAL VASCULAR BALLOON ANGIOPLASTY  02/11/2017   Procedure: PERIPHERAL VASCULAR BALLOON ANGIOPLASTY;  Surgeon: Adrian Prows, MD;  Location: [place] CV LAB;  Service: Cardiovascular;;  Right SFA   PERIPHERAL VASCULAR INTERVENTION Right 02/11/2017   Procedure: PERIPHERAL VASCULAR INTERVENTION;  Surgeon: Adrian Prows, MD;  Location: [place] CV LAB;  Service: Cardiovascular;  Laterality: Right;  Rt SFA   POLYPECTOMY  10/05/2020   Procedure: POLYPECTOMY;  Surgeon: Carol Ada, MD;  Location: [facility]OS[facility];  Service: Endoscopy;;   stent in right leg  12/29/2012   for blood clot, Dr. Nadyne Coombes  TOTAL KNEE ARTHROPLASTY Left 04/18/2020   TOTAL KNEE ARTHROPLASTY Left 04/18/2020   Procedure: LEFT TOTAL KNEE ARTHROPLASTY;  Surgeon: Meredith Pel, MD;  Location: [place];  Service: Orthopedics;  Laterality: Left;   TYMPANOMASTOIDECTOMY Left 03/08/2014   Procedure: TYMPANOMASTOIDECTOMY LEFT;  Surgeon: Ascencion Dike, MD;  Location: [place];  Service: ENT;  Laterality: Left;   TYMPANOMASTOIDECTOMY  2015   UTERINE FIBROID SURGERY      FAMILY HISTORY: The patient's family history includes Diabetes in her brother, brother, and mother; Heart disease in her father and mother; Hypertension in her brother, brother, brother, brother, brother, father, mother, sister, and sister.   SOCIAL HISTORY:  The patient  reports that she has quit smoking. Her smoking use included cigarettes. She has a 20.50 pack-year smoking history. She has never used smokeless tobacco. She reports current alcohol use. She reports current drug use. Drugs: Marijuana and Cocaine.  Review of Systems  Constitutional: Negative for chills and fever.  HENT:  Negative for hoarse voice and nosebleeds.   Eyes:  Negative for discharge, double vision and pain.  Cardiovascular:   Positive for claudication (stable), dyspnea on exertion and leg swelling. Negative for chest pain, near-syncope, orthopnea, palpitations, paroxysmal nocturnal dyspnea and syncope.  Respiratory:  Negative for hemoptysis and shortness of breath.   Musculoskeletal:  Positive for arthritis and joint pain. Negative for muscle cramps and myalgias.  Gastrointestinal:  Negative for abdominal pain, constipation, diarrhea, hematemesis, hematochezia, melena, nausea and vomiting.  Neurological:  Negative for dizziness and light-headedness.    PHYSICAL EXAM:    12/06/2021    3:36 PM 12/04/2021    8:18 AM 12/04/2021    7:00 AM  Vitals with BMI  Height 5\' 0"     Weight 212 lbs 13 oz    BMI 69.48    Systolic 546 270   Diastolic 56 50   Pulse 60 92 81    CONSTITUTIONAL: Well-developed and well-nourished. No acute distress.  SKIN: Skin is warm and dry. No rash noted. No cyanosis. No pallor. No jaundice HEAD: Normocephalic and atraumatic.  EYES: No scleral icterus MOUTH/THROAT: Moist oral membranes.  NECK: No JVD present. No thyromegaly noted.  Left carotid bruits  LYMPHATIC: No visible cervical adenopathy.  CHEST Normal respiratory effort. No intercostal retractions  LUNGS: Clear to auscultation bilaterally.  No stridor. No wheezes. No rales.  CARDIOVASCULAR: Regular, positive J5-K0, soft systolic ejection murmur, no gallops or rubs. ABDOMINAL: Obese, soft, nontender, nondistended, positive bowel sounds in all 4 quadrants, no apparent ascites.  EXTREMITIES: 1+ pitting bilateral peripheral edema to mid shin, warm to touch bilaterally.  1+ bilateral femoral pulses (difficult to palpate due to pannus), unable to palpate bilateral popliteal pulses or posterior tibial pulses, 2+ bilateral dorsalis pedis pulses.  Tender to touch over the anterior and lateral aspect of the mid thigh.  No discoloration/cyanosis/clubbing of the distal extremity. HEMATOLOGIC: No significant bruising NEUROLOGIC: Oriented to  person, place, and time. Nonfocal. Normal muscle tone.  PSYCHIATRIC: Normal mood and affect. Normal behavior. Cooperative  CARDIAC DATABASE: EKG: 08/14/2021: NSR, 68 bpm, normal axis, PACs, without underlying injury pattern. 09/10/2021: NSR, 94bpm, occasional PVCs.   Echocardiogram: 01/30/2021: Normal LV systolic function with visual EF 55-60%. Left ventricle cavity is normal in size. Mild left ventricular hypertrophy. Normal global wall motion. Normal diastolic filling pattern, normal LAP.  Mild aortic valve stenosis (Peak velocity 2.82m/s, PG 5mmHg, MG 12.74mmHG, AVA 1.4cm2).  Mild (Grade I) mitral regurgitation. Mild tricuspid regurgitation. No evidence of pulmonary  hypertension. Mild pulmonic regurgitation. Compared to study 02/18/2019: AS remains stable otherwise no significant change.   12/03/2021: 1. Left ventricular ejection fraction, by estimation, is 55 to 60%. The left ventricle has normal function.   2. Right ventricular systolic function is normal. The right ventricular size is normal.   3. Anterior mitral valve leaflet is thickened   4. No large vegetations visualized   5. Consider TEE if suspicion is high for infective endocarditis  Echocardiogram was personally reviewed by Dr. Wynonia Lawman who notes that there is no change compared to previous echo particularly in mitral valve.  No evidence of endocarditis.   Stress Testing:  Exercise myoview stress 05/19/2017: 1. Patient achieved 4.4 METS and reached 83% target heart rate. Exercise capacity low for age. Appropriate heart rate and blood pressure response. Stress symptoms included dyspnea, dizziness. Submaximal exercise stress study. 2. No stress EKG changes suggestive of ischemia. 3. Stress and rest SPECT images demonstrate homogeneous tracer distribution throughout the myocardium. Gated SPECT imaging reveals normal myocardial thickening and wall motion. The left ventricular ejection fraction was normal (65%).   4. This is a low  risk submaximal exercise stress study  Heart Catheterization: Coronary angiogram 02/27/2014: Mid RCA 60-70% stenosis, mid LAD 60-70% stenosis. FFR to the LAD, lesion insignificant. Medical therapy for CAD. Moderate aortic valve calcification.  Carotid artery duplex 01/30/2021:  Duplex suggests stenosis in the right internal carotid artery (50-69%).  Duplex suggests stenosis in the right external carotid artery (<50%).  Duplex suggests stenosis in the left internal carotid artery (50-69%).  Duplex suggests stenosis in the left external carotid artery (<50%).  Antegrade right vertebral artery flow. Antegrade left vertebral artery flow.  Follow up in six months is appropriate if clinically indicated.    Peripheral arteriogram 02/11/2017: Successful PTA with DCB 5x150x2 InPact Admiral in the right proximal to distal SFA and stenting of proximal SFA with DES, [place] 6x100 mm stent. 3 vessel r/o. Left mild disease and 2 vessel R/O. Mild disease left by angiogram 10/05/2013.   VAS US Aorta / IVC/Iliacs 08/17/2021 Calcifications noted throughout the abdominal aorta. There is no evidence of significant stenosis in the aortoiliac arteries, however, this is based on limited visualization.  ABI  08/17/2021 Right: Resting right ankle-brachial index is within normal range. No evidence of significant right lower extremity arterial disease. The right toe-brachial index is normal.   Left: Resting left ankle-brachial index is within normal range. No evidence of significant left lower extremity arterial disease. The left toe-brachial index is abnormal.  Lower extremity arterial duplex: 08/17/2021 Right: 50-74% stenosis noted in the deep femoral artery. Patent stent with no evidence of stenosis in the superficial femoral artery.  LABORATORY DATA:    Latest Ref Rng & Units 12/03/2021    1:34 AM 12/02/2021   12:30 AM 11/30/2021    6:41 AM  CBC  WBC 4.0 - 10.5 K/uL 13.3  11.8  13.4   Hemoglobin 12.0 - 15.0  g/dL 7.6  7.3  7.0   Hematocrit 36.0 - 46.0 % 28.2  27.1  26.1   Platelets 150 - 400 K/uL 537  430  303        Latest Ref Rng & Units 12/04/2021    2:07 AM 12/03/2021    1:34 AM 12/02/2021   12:30 AM  CMP  Glucose 70 - 99 mg/dL 131  174  127   BUN 8 - 23 mg/dL 7  8  7    Creatinine 0.44 - 1.00 mg/dL 0.76  0.74  0.75   Sodium 135 - 145 mmol/L 138  139  136   Potassium 3.5 - 5.1 mmol/L 3.7  3.7  4.1   Chloride 98 - 111 mmol/L 103  101  103   CO2 22 - 32 mmol/L 29  27  25    Calcium 8.9 - 10.3 mg/dL 8.6  8.9  8.4     Lipid Panel  Lab Results  Component Value Date   CHOL 138 03/26/2021   HDL 54 03/26/2021   LDLCALC 69 03/26/2021   LDLDIRECT 71 03/26/2021   TRIG 78 03/26/2021   CHOLHDL 4.5 05/12/2012     Lab Results  Component Value Date   HGBA1C 4.9 09/21/2021   HGBA1C 5.8 (H) 05/09/2021   HGBA1C 6.3 (H) 04/12/2020   No components found for: "NTPROBNP" Lab Results  Component Value Date   TSH 0.315 (L) 08/26/2021   TSH 1.528 05/11/2012   TSH 2.688 01/10/2011    Cardiac Panel (last 3 results) No results for input(s): "CKTOTAL", "CKMB", "TROPONINIHS", "RELINDX" in the last 72 hours.  IMPRESSION:  No diagnosis found.    RECOMMENDATIONS: [name] is a 71 y.o. female whose past medical history and cardiovascular risk factors include: Established coronary artery disease without angina pectoris, hypertension, hyperlipidemia, former smoker, peripheral artery disease status post PV angiogram with revascularization of the SFA, non-insulin-dependent diabetes, postmenopausal female, advanced age, obesity due to excess calories, history of prior GI bleed, COPD on supplemental oxygen via nasal cannula.   Reviewed and discussed with patient and her daughter recent hospitalization.  Discussed results of echocardiogram done during admission.  Addressed patient's questions and concerns from a cardiac standpoint.  PAD (peripheral artery disease) (HCC) Stable  No  significant critical limb ischemia. Continue statin therapy and Zetia.  Continue aspirin  Atherosclerosis of native coronary artery of native heart without angina pectoris No symptoms of angina pectoris.  Continue present medical therapy including atorvastatin, Zetia, aspirin  Carotid artery stenosis, asymptomatic, bilateral Continue current medical therapy Schedule patient for previously ordered surveillance of carotid arteries.  Essential hypertension Pressure remains soft, although patient remains relatively asymptomatic from this.  We will continue to monitor. Continue present medications.  Non-insulin dependent type 2 diabetes mellitus ([place]) Defer management to PCP.  Bilateral leg edema: Continue Lasix Recommended Dash diet as edema is likely related to high dietary sodium intake.  COPD/former smoker: Patient recently hospitalized with sepsis secondary to pneumonia.  Recommend close follow-up with pulmonology  FINAL MEDICATION LIST END OF ENCOUNTER: No orders of the defined types were placed in this encounter.     Current Outpatient Medications:    acetaminophen (TYLENOL) 500 MG tablet, Take 1,000 mg by mouth daily as needed for moderate pain., Disp: , Rfl:    albuterol (PROVENTIL) (2.5 MG/3ML) 0.083% nebulizer solution, Take 2.5 mg by nebulization every 6 (six) hours as needed for wheezing or shortness of breath., Disp: , Rfl:    albuterol (VENTOLIN HFA) 108 (90 Base) MCG/ACT inhaler, Inhale 1-2 puffs into the lungs every 6 (six) hours as needed for wheezing or shortness of breath. (Patient taking differently: Inhale 2 puffs into the lungs every 6 (six) hours as needed for wheezing or shortness of breath.), Disp: 8.5 g, Rfl: 1   ASPIRIN LOW DOSE 81 MG EC tablet, TAKE 1 TABLET(81 MG) BY MOUTH DAILY (Patient taking differently: Take 81 mg by mouth daily.), Disp: 30 tablet, Rfl: 0   atorvastatin (LIPITOR) 80 MG tablet, Take 80 mg by mouth daily., Disp: , Rfl:  cefpodoxime  (VANTIN) 200 MG tablet, Take 1 tablet (200 mg total) by mouth 2 (two) times daily., Disp: 16 tablet, Rfl: 0   Cholecalciferol (VITAMIN D-3 PO), Take 1 capsule by mouth 2 (two) times daily., Disp: , Rfl:    ezetimibe (ZETIA) 10 MG tablet, TAKE 1 TABLET BY MOUTH DAILY (Patient taking differently: Take 10 mg by mouth daily.), Disp: 90 tablet, Rfl: 3   ferrous sulfate 325 (65 FE) MG tablet, Take 325 mg by mouth daily., Disp: , Rfl:    fluticasone (FLONASE) 50 MCG/ACT nasal spray, Place 1 spray into both nostrils daily as needed for allergies., Disp: , Rfl:    fluticasone furoate-vilanterol (BREO ELLIPTA) 100-25 MCG/ACT AEPB, Inhale 1 puff into the lungs daily., Disp: 60 each, Rfl: 1   furosemide (LASIX) 40 MG tablet, Take 1 tablet (40 mg total) by mouth daily., Disp: 30 tablet, Rfl: 1   gabapentin (NEURONTIN) 600 MG tablet, Take 1 tablet (600 mg total) by mouth at bedtime., Disp: 30 tablet, Rfl: 11   ipratropium-albuterol (DUONEB) 0.5-2.5 (3) MG/3ML SOLN, Take 3 mLs by nebulization 3 (three) times daily. (Patient taking differently: Take 3 mLs by nebulization 3 (three) times daily as needed (wheezing).), Disp: 270 mL, Rfl: 1   losartan (COZAAR) 25 MG tablet, Take 1 tablet (25 mg total) by mouth daily., Disp: 30 tablet, Rfl: 0   metFORMIN (GLUCOPHAGE) 500 MG tablet, Take 0.5 tablets (250 mg total) by mouth 2 (two) times daily with a meal., Disp: , Rfl:    metoprolol tartrate (LOPRESSOR) 25 MG tablet, Take 1 tablet (25 mg total) by mouth 2 (two) times daily., Disp: 60 tablet, Rfl: 11   MYRBETRIQ 25 MG TB24 tablet, Take 25 mg by mouth daily., Disp: , Rfl:    nitroGLYCERIN (NITROSTAT) 0.4 MG SL tablet, Place 1 tablet (0.4 mg total) under the tongue every 5 (five) minutes as needed for chest pain., Disp: 90 tablet, Rfl: 3   OXYGEN, Inhale 2 L into the lungs continuous., Disp: , Rfl:    pantoprazole (PROTONIX) 40 MG tablet, Take 1 tablet (40 mg total) by mouth 2 (two) times daily. (Patient taking differently:  Take 40 mg by mouth daily.), Disp: 60 tablet, Rfl: 0   potassium chloride SA (KLOR-CON M) 20 MEQ tablet, Take 1 tablet (20 mEq total) by mouth daily., Disp: 30 tablet, Rfl: 0   spironolactone (ALDACTONE) 50 MG tablet, TAKE 1 TABLET(50 MG) BY MOUTH DAILY (Patient taking differently: Take 50 mg by mouth daily.), Disp: 90 tablet, Rfl: 3  No orders of the defined types were placed in this encounter.   --Continue cardiac medications as reconciled in final medication list. --No follow-ups on file. Or sooner if needed. --Continue follow-up with your primary care physician regarding the management of your other chronic comorbid conditions.  Patient's questions and concerns were addressed to her satisfaction. She voices understanding of the instructions provided during this encounter.   This note was created using a voice recognition software as a result there may be grammatical errors inadvertently enclosed that do not reflect the nature of this encounter. Every attempt is made to correct such errors.   Alethia Berthold, PA-C 12/06/2021, 4:24 PM Office: [phone]

## 2021-12-10 ENCOUNTER — Other Ambulatory Visit: Payer: Self-pay | Admitting: *Deleted

## 2021-12-10 DIAGNOSIS — R7881 Bacteremia: Secondary | ICD-10-CM | POA: Diagnosis not present

## 2021-12-10 DIAGNOSIS — F17210 Nicotine dependence, cigarettes, uncomplicated: Secondary | ICD-10-CM | POA: Diagnosis not present

## 2021-12-10 DIAGNOSIS — D500 Iron deficiency anemia secondary to blood loss (chronic): Secondary | ICD-10-CM | POA: Diagnosis not present

## 2021-12-10 DIAGNOSIS — I110 Hypertensive heart disease with heart failure: Secondary | ICD-10-CM | POA: Diagnosis not present

## 2021-12-10 DIAGNOSIS — Z9981 Dependence on supplemental oxygen: Secondary | ICD-10-CM | POA: Diagnosis not present

## 2021-12-10 DIAGNOSIS — A419 Sepsis, unspecified organism: Secondary | ICD-10-CM | POA: Diagnosis not present

## 2021-12-10 DIAGNOSIS — K922 Gastrointestinal hemorrhage, unspecified: Secondary | ICD-10-CM | POA: Diagnosis not present

## 2021-12-10 DIAGNOSIS — J9611 Chronic respiratory failure with hypoxia: Secondary | ICD-10-CM | POA: Diagnosis not present

## 2021-12-10 DIAGNOSIS — I5030 Unspecified diastolic (congestive) heart failure: Secondary | ICD-10-CM | POA: Diagnosis not present

## 2021-12-10 DIAGNOSIS — E1151 Type 2 diabetes mellitus with diabetic peripheral angiopathy without gangrene: Secondary | ICD-10-CM | POA: Diagnosis not present

## 2021-12-10 DIAGNOSIS — J440 Chronic obstructive pulmonary disease with acute lower respiratory infection: Secondary | ICD-10-CM | POA: Diagnosis not present

## 2021-12-10 DIAGNOSIS — I2510 Atherosclerotic heart disease of native coronary artery without angina pectoris: Secondary | ICD-10-CM | POA: Diagnosis not present

## 2021-12-10 DIAGNOSIS — Z7984 Long term (current) use of oral hypoglycemic drugs: Secondary | ICD-10-CM | POA: Diagnosis not present

## 2021-12-10 NOTE — Patient Outreach (Signed)
[facility]) Care Management Telephonic RN Care Manager Note   12/10/2021 Name:  [name] MRN:  [id number] DOB:  15-Apr-1951  Summary: Pt recovering well with no additional acute episodes exacerbation of PNA or GI bleeds. Pt is in the GREEN zone with her COPD at rest however continues with some SOB with mobility. [facility] involved with therapies and a social work.  Recommendations/Changes made from today's visit: Strongly encourage pt to follow up with her provider's office for an appointment and the [place] specialist for pending appointments for both the provider's office.  Subjective: [name] is an 71 y.o. year old female who is a primary patient of Osei-Bonsu, Iona Beard, MD. The care management team was consulted for assistance with care management and/or care coordination needs.    Telephonic RN Care Manager completed Telephone Visit today.  Objective:   Medications Reviewed Today     Reviewed by Gaye Alken, [place] (Certified Medical Assistant) on 12/06/21 at [place] List Status: <None>   Medication Order Taking? Sig Documenting Provider Last Dose Status Informant  acetaminophen (TYLENOL) 500 MG tablet [id number] Yes Take 1,000 mg by mouth daily as needed for moderate pain. [provider] Taking Active Child  albuterol (PROVENTIL) (2.5 MG/3ML) 0.083% nebulizer solution [id number] Yes Take 2.5 mg by nebulization every 6 (six) hours as needed for wheezing or shortness of breath. [provider] Taking Active Self, Pharmacy Records, Child  albuterol (VENTOLIN HFA) 108 (90 Base) MCG/ACT inhaler [id number] Yes Inhale 1-2 puffs into the lungs every 6 (six) hours as needed for wheezing or shortness of breath.  Patient taking differently: Inhale 2 puffs into the lungs every 6 (six) hours as needed for wheezing or shortness of breath.   Shelly Coss, MD Taking Active Child, Self, Pharmacy Records  ASPIRIN LOW DOSE 81 MG EC tablet  [id number] Yes TAKE 1 TABLET(81 MG) BY MOUTH DAILY  Patient taking differently: Take 81 mg by mouth daily.   Magnant, Gerrianne Scale, PA-C Taking Active Child  atorvastatin (LIPITOR) 80 MG tablet [id number] Yes Take 80 mg by mouth daily. [provider] Taking Active Pharmacy Records, Child  cefpodoxime (VANTIN) 200 MG tablet [id number]  Take 1 tablet (200 mg total) by mouth 2 (two) times daily. Carlyle Basques, MD  Active   Cholecalciferol (VITAMIN D-3 PO) [id number] Yes Take 1 capsule by mouth 2 (two) times daily. [provider] Taking Active Child  ezetimibe (ZETIA) 10 MG tablet [id number] Yes TAKE 1 TABLET BY MOUTH DAILY  Patient taking differently: Take 10 mg by mouth daily.   Rex Kras, DO Taking Active Pharmacy Records, Child  ferrous sulfate 325 (65 FE) MG tablet [id number] Yes Take 325 mg by mouth daily. [provider] Taking Active Child  fluticasone (FLONASE) 50 MCG/ACT nasal spray [id number] Yes Place 1 spray into both nostrils daily as needed for allergies. [provider] Taking Active Pharmacy Records, Child  fluticasone furoate-vilanterol (BREO ELLIPTA) 100-25 MCG/ACT AEPB [id number] Yes Inhale 1 puff into the lungs daily. Shelly Coss, MD Taking Active Child, Pharmacy Records  furosemide (LASIX) 40 MG tablet [id number] Yes Take 1 tablet (40 mg total) by mouth daily. Shelly Coss, MD Taking Active Child, Pharmacy Records  gabapentin (NEURONTIN) 600 MG tablet [id number] Yes Take 1 tablet (600 mg total) by mouth at bedtime. Garrel Ridgel, DPM Taking Active Pharmacy Records, Child  ipratropium-albuterol (DUONEB) 0.5-2.5 (3) MG/3ML SOLN [id number] Yes Take 3 mLs by nebulization 3 (three) times daily.  Patient taking differently: Take 3  mLs by nebulization 3 (three) times daily as needed (wheezing).   Shelly Coss, MD Taking Active Child, Pharmacy Records  losartan (COZAAR) 25 MG tablet [id number] Yes Take 1 tablet (25 mg total) by mouth daily. Shelly Coss, MD Taking Active Child, Pharmacy Records  metFORMIN (GLUCOPHAGE) 500 MG tablet [id number] Yes Take 0.5 tablets (250 mg total) by mouth 2 (two) times daily with a meal. Adrian Prows, MD Taking Active Pharmacy Records, Child           Med Note (COFFELL, Dionne Bucy   Thu Nov 29, 2021  7:56 AM) Per pt's daughter, pt is out of medication as hasn't been taking this. Per dispense report pt should still have enough. Pt's daughter insists she is out of medication.  metoprolol tartrate (LOPRESSOR) 25 MG tablet [id number] Yes Take 1 tablet (25 mg total) by mouth 2 (two) times daily. Shelly Coss, MD Taking Active Child, Pharmacy Records  MYRBETRIQ 25 [place] TB24 tablet [id number] Yes Take 25 mg by mouth daily. [provider] Taking Active Pharmacy Records, Child           Med Note (COFFELL, Dionne Bucy   Thu Nov 08, 2021  8:25 AM)    nitroGLYCERIN (NITROSTAT) 0.4 MG SL tablet [id number] Yes Place 1 tablet (0.4 mg total) under the tongue every 5 (five) minutes as needed for chest pain. Miquel Dunn, NP Taking Active Pharmacy Records, Child           Med Note (COFFELL, Dionne Bucy   Thu Nov 29, 2021  7:29 AM)    OXYGEN [id number] Yes Inhale 2 L into the lungs continuous. [provider] Taking Active Child  pantoprazole (PROTONIX) 40 MG tablet [id number] Yes Take 1 tablet (40 mg total) by mouth 2 (two) times daily.  Patient taking differently: Take 40 mg by mouth daily.   Thurnell Lose, MD Taking Active Pharmacy Records, Child  potassium chloride SA (KLOR-CON M) 20 MEQ tablet [id number] Yes Take 1 tablet (20 mEq total) by mouth daily. Shelly Coss, MD Taking Active Child, Pharmacy Records  spironolactone (ALDACTONE) 50 MG tablet [id number] Yes TAKE 1 TABLET(50 MG) BY MOUTH DAILY  Patient taking differently: Take 50 mg by mouth daily.   Rex Kras, DO Taking Active Pharmacy Records, Child             SDOH:  (Social Determinants of Health) assessments and interventions performed:      Care Plan  Review of patient past medical history, allergies, medications, health status, including review of consultants reports, laboratory and other test data, was performed as part of comprehensive evaluation for care management services.   Care Plan : RN Care Manager Plan of Care  Updates made by Tobi Bastos, RN since 12/10/2021 12:00 AM     Problem: Knowledg Deficit related to COPD management and care coordination needs.   Priority: High     Long-Range Goal: Development of plan of care for manament of COPD   Start Date: 08/31/2021  Expected End Date: 02/21/2022  This Visit's Progress: On track  Recent Progress: On track  Priority: High  Note:   Current Barriers:  Knowledge Deficits related to plan of care for management of COPD   RNCM Clinical Goal(s):  Patient will verbalize understanding of plan for management of COPD as evidenced by self report and chart review  through collaboration with RN Care manager, provider, and care team.   Interventions: Inter-disciplinary care team collaboration (see longitudinal plan of care) Evaluation of current  treatment plan related to  self management and patient's adherence to plan as established by provider   COPD Interventions:  (Status:  Goal on track:  Yes.) Long Term Goal Provided patient with basic written and verbal COPD education on self care/management/and exacerbation prevention Advised patient to track and manage COPD triggers Provided written and verbal instructions on pursed lip breathing and utilized returned demonstration as teach back Provided instruction about proper use of medications used for management of COPD including inhalers Advised patient to self assesses COPD action plan zone and make appointment with provider if in the yellow zone for 48 hours without improvement Provided education about and advised patient to utilize infection prevention strategies to reduce risk of respiratory infection Discussed  the importance of adequate rest and management of fatigue with COPD Screening for signs and symptoms of depression related to chronic disease state  Assessed social determinant of health barriers  TOC week #3-3/15: Pt continues to do well however "alittle under the weather" today but remains in the GREEN zone with her ongoing COPD and management of care. Continue to have support from her daughter. No acute issues or related symptoms with exacerbation related to her COPD. Pt remains aware to follow up with provider with acute needs in the YELLOW zone. Will follow up next week with ongoing transition of care call next week. TOC week #4-3/28: Readmit on 3/22 "coughing up blood". Pt denies any additional symptoms however continue to utilize her ongoing COPD treatment and home O2 with some sputum that is clear, no additional bleeding. Pt feeling better and verifies she is taking all her prescribed medications with no acute issues and sufficient transportation to all medical appointments. Spoke with pt today and who inquired on received DME for a pure-wick and hospital bed. This has been discussed in the pass and pt  has spoken with her provider and started this process however DME agency having issues with getting pt's insurance to cover this request. Pt aware to call her insurance customer services for assist with DME coverage. Pt called [facility] hotline today assuming this is something that we can approved. Pt educated on services once again and the use of the [facility] RN hotline, daughter also updated as they were not aware. No other issues reported. RN reiterated on the plan of care and answer all inquires and questions. Strongly encourage adherence with pt's following the plan of care. Will follow up next week with any acute needs and continue to address issues accordingly.  TOC last hospital 3/31-4/4 for Acute upper GI bleed: RN spoke with pt today and received an update. Pt continues to recover well with no signs of  bleeding however pt reports unable to find the source of the bleed. Pt states her breathing is much improved with her ongoing home O2 2 liters. Pt remains in the GREEN zone with no symptoms at this time. Pt remains aware on who to call with any symptoms of bleed and currently pending an appointment with GI on 4/12 to follow up on her bleeding. Denies any dizziness or related symptoms of lost of blood and aware to use caution with all activities until the source is found. Pt continue to have a supportive daughter to assist with any acute issues.  10/05/2021 Update: Pt continues to do well with no acute issues or encounters. Pt continue to remains in the GREEN zone with her COPD. Denies any symptoms of coughing up blood as experienced last month. Currently awaiting pharmacy to receive orders for  her ongoing nebulizer medications. Primary care visit end of April. Reports CBG for  her diabetes remain stable with baseline readings of 135 this AM fasting. Denies any current issues or needs at this time. Will review the current plan of care and intervention accordingly. Pt prefers monthly follow up call moving forward and feel she does not need a weekly contact.  5/16 Update:Pt reports no issues related to her breathing however issues with being very "fatigue" not related to her breathing but very tired and weak. Pt has a follow up appointment with her provider and will address this issues and change. Pt verified she is in the GREEN zone with no acute needs or issues to address at this time. Verified adherence to all her medications and sufficient transportation to get to all her medical appointments. No other issues or needs presented as pt is managing her COPD with no reported issues.   11/14/2021 Update (TOC program restarted post discharge) - Hospitalized for GI bleed requiring transfusion of PRBC.  Denies any further bleeding since discharge home.  Local GI placed referral to [facility] specialist for further testing,  she is waiting on call to schedule follow up.  Will see cardiology on 6/6, state she had "fluid on my lungs" while in hospital, started on Lasix. Denies any swelling or additional shortness of breath other than COPD.  Recommended to see pulmonary, advised of where to find contact information for office, state she will call to schedule.  Follow up with PCP scheduled for 6/17.  She has received call from [facility], will make first home visit tomorrow.    11/21/2021 TOC Update: Spoke with pt today who reports she will visit with her PCP today. States she has called [facility] and currently awaiting a call back to scheduled an appointment. Pt denies any breathing issues and continues to be in the GREEN zone for her COPD however pt continue to complain about ongoing dizziness and cramps and will address these issues with her primary provider today. No falls related to her dizziness however encouraged pt to utilize her assistive devices on fall prevention. Pt feels she is doing well and has opted to receive monthly follow up contacts instead of weekly TOC. Offered to follow up in approximately three weeks for a telephone assessment (receptive).  Will update provider on pt's disposition with [facility] services.  12/10/2021 post d/c Update: Pt reports she continue to do well with her breathing "sitting still" however upon mobility she becomes very SOB. Pt was informed to follow up with the [place] specialist to rescheduled an appointment due to her recent hospital admission. Continues home O2 currently on 3 liters. Verified adherence to all her medications as her daughter continues to fill her pillbox and did not wish to review. States she has completed all antibiotics and short term medications. Pt with her granddaughter during the day and lives with her daughter [name]. [place] initial visit has occurred pending other disciplines to visit in the home, including a Education officer, museum. No acute needs or issues  presented today. RN offered weekly TOC outreach calls as this has been the choice in the past after her discharge however pt opt to monthly with no weekly call "necessary" Offered to call in 3 weeks to inquire if the pulmonologist appointment has been established (receptive). Pt remains aware to utilize the [facility] nurse's hotline for any immediate needs if RN case management is not available or the [facility] office is closed.   Patient Goals/Self-Care Activities:  Take all medications as prescribed Attend all scheduled provider appointments Call pharmacy for medication refills 3-7 days in advance of running out of medications Attend church or other social activities Perform all self care activities independently  Perform IADL's (shopping, preparing meals, housekeeping, managing finances) independently Call provider office for new concerns or questions  eliminate smoking in my home identify and avoid work-related triggers identify and remove indoor air pollutants limit outdoor activity during cold weather develop a rescue plan eliminate symptom triggers at home follow rescue plan if symptoms flare-up keep follow-up appointments: Continued to encourage pt to follow up with her speciality provider and announce her recent discharge from the hospital for a possible sooner appointment. don't eat or exercise right before bedtime use devices that will help like a cane, sock-puller or reacher do breathing exercises every day  Follow Up Plan:  Telephone follow up appointment with care management team member scheduled for:  July 2023 The patient has been provided with contact information for the care management team and has been advised to call with any health related questions or concerns.        [name], RN Care Management Coordinator [place] Office [phone]

## 2021-12-12 DIAGNOSIS — Z9981 Dependence on supplemental oxygen: Secondary | ICD-10-CM | POA: Diagnosis not present

## 2021-12-12 DIAGNOSIS — I10 Essential (primary) hypertension: Secondary | ICD-10-CM | POA: Diagnosis not present

## 2021-12-12 DIAGNOSIS — Z7984 Long term (current) use of oral hypoglycemic drugs: Secondary | ICD-10-CM | POA: Diagnosis not present

## 2021-12-12 DIAGNOSIS — F17210 Nicotine dependence, cigarettes, uncomplicated: Secondary | ICD-10-CM | POA: Diagnosis not present

## 2021-12-12 DIAGNOSIS — I110 Hypertensive heart disease with heart failure: Secondary | ICD-10-CM | POA: Diagnosis not present

## 2021-12-12 DIAGNOSIS — I2510 Atherosclerotic heart disease of native coronary artery without angina pectoris: Secondary | ICD-10-CM | POA: Diagnosis not present

## 2021-12-12 DIAGNOSIS — J440 Chronic obstructive pulmonary disease with acute lower respiratory infection: Secondary | ICD-10-CM | POA: Diagnosis not present

## 2021-12-12 DIAGNOSIS — E1140 Type 2 diabetes mellitus with diabetic neuropathy, unspecified: Secondary | ICD-10-CM | POA: Diagnosis not present

## 2021-12-12 DIAGNOSIS — E7800 Pure hypercholesterolemia, unspecified: Secondary | ICD-10-CM | POA: Diagnosis not present

## 2021-12-12 DIAGNOSIS — A419 Sepsis, unspecified organism: Secondary | ICD-10-CM | POA: Diagnosis not present

## 2021-12-12 DIAGNOSIS — F17200 Nicotine dependence, unspecified, uncomplicated: Secondary | ICD-10-CM | POA: Diagnosis not present

## 2021-12-12 DIAGNOSIS — J449 Chronic obstructive pulmonary disease, unspecified: Secondary | ICD-10-CM | POA: Diagnosis not present

## 2021-12-12 DIAGNOSIS — K922 Gastrointestinal hemorrhage, unspecified: Secondary | ICD-10-CM | POA: Diagnosis not present

## 2021-12-12 DIAGNOSIS — I5030 Unspecified diastolic (congestive) heart failure: Secondary | ICD-10-CM | POA: Diagnosis not present

## 2021-12-12 DIAGNOSIS — E1151 Type 2 diabetes mellitus with diabetic peripheral angiopathy without gangrene: Secondary | ICD-10-CM | POA: Diagnosis not present

## 2021-12-12 DIAGNOSIS — G894 Chronic pain syndrome: Secondary | ICD-10-CM | POA: Diagnosis not present

## 2021-12-12 DIAGNOSIS — J9611 Chronic respiratory failure with hypoxia: Secondary | ICD-10-CM | POA: Diagnosis not present

## 2021-12-12 DIAGNOSIS — R7881 Bacteremia: Secondary | ICD-10-CM | POA: Diagnosis not present

## 2021-12-12 DIAGNOSIS — D500 Iron deficiency anemia secondary to blood loss (chronic): Secondary | ICD-10-CM | POA: Diagnosis not present

## 2021-12-13 ENCOUNTER — Ambulatory Visit: Payer: Medicare Other | Admitting: *Deleted

## 2021-12-13 DIAGNOSIS — J440 Chronic obstructive pulmonary disease with acute lower respiratory infection: Secondary | ICD-10-CM | POA: Diagnosis not present

## 2021-12-13 DIAGNOSIS — I5030 Unspecified diastolic (congestive) heart failure: Secondary | ICD-10-CM | POA: Diagnosis not present

## 2021-12-13 DIAGNOSIS — E1151 Type 2 diabetes mellitus with diabetic peripheral angiopathy without gangrene: Secondary | ICD-10-CM | POA: Diagnosis not present

## 2021-12-13 DIAGNOSIS — J9611 Chronic respiratory failure with hypoxia: Secondary | ICD-10-CM | POA: Diagnosis not present

## 2021-12-13 DIAGNOSIS — R7881 Bacteremia: Secondary | ICD-10-CM | POA: Diagnosis not present

## 2021-12-13 DIAGNOSIS — I2510 Atherosclerotic heart disease of native coronary artery without angina pectoris: Secondary | ICD-10-CM | POA: Diagnosis not present

## 2021-12-13 DIAGNOSIS — D500 Iron deficiency anemia secondary to blood loss (chronic): Secondary | ICD-10-CM | POA: Diagnosis not present

## 2021-12-13 DIAGNOSIS — K922 Gastrointestinal hemorrhage, unspecified: Secondary | ICD-10-CM | POA: Diagnosis not present

## 2021-12-13 DIAGNOSIS — F17210 Nicotine dependence, cigarettes, uncomplicated: Secondary | ICD-10-CM | POA: Diagnosis not present

## 2021-12-13 DIAGNOSIS — Z9981 Dependence on supplemental oxygen: Secondary | ICD-10-CM | POA: Diagnosis not present

## 2021-12-13 DIAGNOSIS — Z7984 Long term (current) use of oral hypoglycemic drugs: Secondary | ICD-10-CM | POA: Diagnosis not present

## 2021-12-13 DIAGNOSIS — A419 Sepsis, unspecified organism: Secondary | ICD-10-CM | POA: Diagnosis not present

## 2021-12-13 DIAGNOSIS — I110 Hypertensive heart disease with heart failure: Secondary | ICD-10-CM | POA: Diagnosis not present

## 2021-12-17 ENCOUNTER — Other Ambulatory Visit: Payer: Self-pay | Admitting: Cardiology

## 2021-12-17 DIAGNOSIS — J9611 Chronic respiratory failure with hypoxia: Secondary | ICD-10-CM | POA: Diagnosis not present

## 2021-12-17 DIAGNOSIS — J440 Chronic obstructive pulmonary disease with acute lower respiratory infection: Secondary | ICD-10-CM | POA: Diagnosis not present

## 2021-12-17 DIAGNOSIS — F17210 Nicotine dependence, cigarettes, uncomplicated: Secondary | ICD-10-CM | POA: Diagnosis not present

## 2021-12-17 DIAGNOSIS — K922 Gastrointestinal hemorrhage, unspecified: Secondary | ICD-10-CM | POA: Diagnosis not present

## 2021-12-17 DIAGNOSIS — I110 Hypertensive heart disease with heart failure: Secondary | ICD-10-CM | POA: Diagnosis not present

## 2021-12-17 DIAGNOSIS — Z9981 Dependence on supplemental oxygen: Secondary | ICD-10-CM | POA: Diagnosis not present

## 2021-12-17 DIAGNOSIS — R7881 Bacteremia: Secondary | ICD-10-CM | POA: Diagnosis not present

## 2021-12-17 DIAGNOSIS — Z7984 Long term (current) use of oral hypoglycemic drugs: Secondary | ICD-10-CM | POA: Diagnosis not present

## 2021-12-17 DIAGNOSIS — I2510 Atherosclerotic heart disease of native coronary artery without angina pectoris: Secondary | ICD-10-CM | POA: Diagnosis not present

## 2021-12-17 DIAGNOSIS — E1151 Type 2 diabetes mellitus with diabetic peripheral angiopathy without gangrene: Secondary | ICD-10-CM | POA: Diagnosis not present

## 2021-12-17 DIAGNOSIS — I5030 Unspecified diastolic (congestive) heart failure: Secondary | ICD-10-CM | POA: Diagnosis not present

## 2021-12-17 DIAGNOSIS — D500 Iron deficiency anemia secondary to blood loss (chronic): Secondary | ICD-10-CM | POA: Diagnosis not present

## 2021-12-17 DIAGNOSIS — A419 Sepsis, unspecified organism: Secondary | ICD-10-CM | POA: Diagnosis not present

## 2021-12-18 ENCOUNTER — Other Ambulatory Visit (HOSPITAL_COMMUNITY): Payer: Self-pay

## 2021-12-19 DIAGNOSIS — I5030 Unspecified diastolic (congestive) heart failure: Secondary | ICD-10-CM | POA: Diagnosis not present

## 2021-12-19 DIAGNOSIS — D500 Iron deficiency anemia secondary to blood loss (chronic): Secondary | ICD-10-CM | POA: Diagnosis not present

## 2021-12-19 DIAGNOSIS — J440 Chronic obstructive pulmonary disease with acute lower respiratory infection: Secondary | ICD-10-CM | POA: Diagnosis not present

## 2021-12-19 DIAGNOSIS — J9611 Chronic respiratory failure with hypoxia: Secondary | ICD-10-CM | POA: Diagnosis not present

## 2021-12-19 DIAGNOSIS — E1151 Type 2 diabetes mellitus with diabetic peripheral angiopathy without gangrene: Secondary | ICD-10-CM | POA: Diagnosis not present

## 2021-12-19 DIAGNOSIS — Z7984 Long term (current) use of oral hypoglycemic drugs: Secondary | ICD-10-CM | POA: Diagnosis not present

## 2021-12-19 DIAGNOSIS — Z9981 Dependence on supplemental oxygen: Secondary | ICD-10-CM | POA: Diagnosis not present

## 2021-12-19 DIAGNOSIS — I110 Hypertensive heart disease with heart failure: Secondary | ICD-10-CM | POA: Diagnosis not present

## 2021-12-19 DIAGNOSIS — K922 Gastrointestinal hemorrhage, unspecified: Secondary | ICD-10-CM | POA: Diagnosis not present

## 2021-12-19 DIAGNOSIS — A419 Sepsis, unspecified organism: Secondary | ICD-10-CM | POA: Diagnosis not present

## 2021-12-19 DIAGNOSIS — F17210 Nicotine dependence, cigarettes, uncomplicated: Secondary | ICD-10-CM | POA: Diagnosis not present

## 2021-12-19 DIAGNOSIS — R7881 Bacteremia: Secondary | ICD-10-CM | POA: Diagnosis not present

## 2021-12-19 DIAGNOSIS — I2510 Atherosclerotic heart disease of native coronary artery without angina pectoris: Secondary | ICD-10-CM | POA: Diagnosis not present

## 2021-12-20 DIAGNOSIS — R7881 Bacteremia: Secondary | ICD-10-CM | POA: Diagnosis not present

## 2021-12-20 DIAGNOSIS — Z9981 Dependence on supplemental oxygen: Secondary | ICD-10-CM | POA: Diagnosis not present

## 2021-12-20 DIAGNOSIS — J9611 Chronic respiratory failure with hypoxia: Secondary | ICD-10-CM | POA: Diagnosis not present

## 2021-12-20 DIAGNOSIS — A419 Sepsis, unspecified organism: Secondary | ICD-10-CM | POA: Diagnosis not present

## 2021-12-20 DIAGNOSIS — I110 Hypertensive heart disease with heart failure: Secondary | ICD-10-CM | POA: Diagnosis not present

## 2021-12-20 DIAGNOSIS — I2510 Atherosclerotic heart disease of native coronary artery without angina pectoris: Secondary | ICD-10-CM | POA: Diagnosis not present

## 2021-12-20 DIAGNOSIS — Z7984 Long term (current) use of oral hypoglycemic drugs: Secondary | ICD-10-CM | POA: Diagnosis not present

## 2021-12-20 DIAGNOSIS — F17210 Nicotine dependence, cigarettes, uncomplicated: Secondary | ICD-10-CM | POA: Diagnosis not present

## 2021-12-20 DIAGNOSIS — D500 Iron deficiency anemia secondary to blood loss (chronic): Secondary | ICD-10-CM | POA: Diagnosis not present

## 2021-12-20 DIAGNOSIS — I5030 Unspecified diastolic (congestive) heart failure: Secondary | ICD-10-CM | POA: Diagnosis not present

## 2021-12-20 DIAGNOSIS — K922 Gastrointestinal hemorrhage, unspecified: Secondary | ICD-10-CM | POA: Diagnosis not present

## 2021-12-20 DIAGNOSIS — J440 Chronic obstructive pulmonary disease with acute lower respiratory infection: Secondary | ICD-10-CM | POA: Diagnosis not present

## 2021-12-20 DIAGNOSIS — E1151 Type 2 diabetes mellitus with diabetic peripheral angiopathy without gangrene: Secondary | ICD-10-CM | POA: Diagnosis not present

## 2021-12-24 DIAGNOSIS — J440 Chronic obstructive pulmonary disease with acute lower respiratory infection: Secondary | ICD-10-CM | POA: Diagnosis not present

## 2021-12-24 DIAGNOSIS — R7881 Bacteremia: Secondary | ICD-10-CM | POA: Diagnosis not present

## 2021-12-24 DIAGNOSIS — K922 Gastrointestinal hemorrhage, unspecified: Secondary | ICD-10-CM | POA: Diagnosis not present

## 2021-12-24 DIAGNOSIS — I2510 Atherosclerotic heart disease of native coronary artery without angina pectoris: Secondary | ICD-10-CM | POA: Diagnosis not present

## 2021-12-24 DIAGNOSIS — J9611 Chronic respiratory failure with hypoxia: Secondary | ICD-10-CM | POA: Diagnosis not present

## 2021-12-24 DIAGNOSIS — A419 Sepsis, unspecified organism: Secondary | ICD-10-CM | POA: Diagnosis not present

## 2021-12-24 DIAGNOSIS — D500 Iron deficiency anemia secondary to blood loss (chronic): Secondary | ICD-10-CM | POA: Diagnosis not present

## 2021-12-24 DIAGNOSIS — E1151 Type 2 diabetes mellitus with diabetic peripheral angiopathy without gangrene: Secondary | ICD-10-CM | POA: Diagnosis not present

## 2021-12-24 DIAGNOSIS — Z7984 Long term (current) use of oral hypoglycemic drugs: Secondary | ICD-10-CM | POA: Diagnosis not present

## 2021-12-24 DIAGNOSIS — Z9981 Dependence on supplemental oxygen: Secondary | ICD-10-CM | POA: Diagnosis not present

## 2021-12-24 DIAGNOSIS — F17210 Nicotine dependence, cigarettes, uncomplicated: Secondary | ICD-10-CM | POA: Diagnosis not present

## 2021-12-24 DIAGNOSIS — I110 Hypertensive heart disease with heart failure: Secondary | ICD-10-CM | POA: Diagnosis not present

## 2021-12-24 DIAGNOSIS — I5030 Unspecified diastolic (congestive) heart failure: Secondary | ICD-10-CM | POA: Diagnosis not present

## 2021-12-26 DIAGNOSIS — Z7984 Long term (current) use of oral hypoglycemic drugs: Secondary | ICD-10-CM | POA: Diagnosis not present

## 2021-12-26 DIAGNOSIS — D500 Iron deficiency anemia secondary to blood loss (chronic): Secondary | ICD-10-CM | POA: Diagnosis not present

## 2021-12-26 DIAGNOSIS — K922 Gastrointestinal hemorrhage, unspecified: Secondary | ICD-10-CM | POA: Diagnosis not present

## 2021-12-26 DIAGNOSIS — J9611 Chronic respiratory failure with hypoxia: Secondary | ICD-10-CM | POA: Diagnosis not present

## 2021-12-26 DIAGNOSIS — Z9981 Dependence on supplemental oxygen: Secondary | ICD-10-CM | POA: Diagnosis not present

## 2021-12-26 DIAGNOSIS — I110 Hypertensive heart disease with heart failure: Secondary | ICD-10-CM | POA: Diagnosis not present

## 2021-12-26 DIAGNOSIS — F17210 Nicotine dependence, cigarettes, uncomplicated: Secondary | ICD-10-CM | POA: Diagnosis not present

## 2021-12-26 DIAGNOSIS — J440 Chronic obstructive pulmonary disease with acute lower respiratory infection: Secondary | ICD-10-CM | POA: Diagnosis not present

## 2021-12-26 DIAGNOSIS — A419 Sepsis, unspecified organism: Secondary | ICD-10-CM | POA: Diagnosis not present

## 2021-12-26 DIAGNOSIS — E1151 Type 2 diabetes mellitus with diabetic peripheral angiopathy without gangrene: Secondary | ICD-10-CM | POA: Diagnosis not present

## 2021-12-26 DIAGNOSIS — I2510 Atherosclerotic heart disease of native coronary artery without angina pectoris: Secondary | ICD-10-CM | POA: Diagnosis not present

## 2021-12-26 DIAGNOSIS — I5030 Unspecified diastolic (congestive) heart failure: Secondary | ICD-10-CM | POA: Diagnosis not present

## 2021-12-26 DIAGNOSIS — R7881 Bacteremia: Secondary | ICD-10-CM | POA: Diagnosis not present

## 2021-12-27 DIAGNOSIS — K922 Gastrointestinal hemorrhage, unspecified: Secondary | ICD-10-CM | POA: Diagnosis not present

## 2021-12-27 DIAGNOSIS — I2510 Atherosclerotic heart disease of native coronary artery without angina pectoris: Secondary | ICD-10-CM | POA: Diagnosis not present

## 2021-12-27 DIAGNOSIS — A419 Sepsis, unspecified organism: Secondary | ICD-10-CM | POA: Diagnosis not present

## 2021-12-27 DIAGNOSIS — I5030 Unspecified diastolic (congestive) heart failure: Secondary | ICD-10-CM | POA: Diagnosis not present

## 2021-12-27 DIAGNOSIS — E1151 Type 2 diabetes mellitus with diabetic peripheral angiopathy without gangrene: Secondary | ICD-10-CM | POA: Diagnosis not present

## 2021-12-27 DIAGNOSIS — I110 Hypertensive heart disease with heart failure: Secondary | ICD-10-CM | POA: Diagnosis not present

## 2021-12-27 DIAGNOSIS — Z9981 Dependence on supplemental oxygen: Secondary | ICD-10-CM | POA: Diagnosis not present

## 2021-12-27 DIAGNOSIS — F17210 Nicotine dependence, cigarettes, uncomplicated: Secondary | ICD-10-CM | POA: Diagnosis not present

## 2021-12-27 DIAGNOSIS — J9611 Chronic respiratory failure with hypoxia: Secondary | ICD-10-CM | POA: Diagnosis not present

## 2021-12-27 DIAGNOSIS — R7881 Bacteremia: Secondary | ICD-10-CM | POA: Diagnosis not present

## 2021-12-27 DIAGNOSIS — D500 Iron deficiency anemia secondary to blood loss (chronic): Secondary | ICD-10-CM | POA: Diagnosis not present

## 2021-12-27 DIAGNOSIS — Z7984 Long term (current) use of oral hypoglycemic drugs: Secondary | ICD-10-CM | POA: Diagnosis not present

## 2021-12-27 DIAGNOSIS — J440 Chronic obstructive pulmonary disease with acute lower respiratory infection: Secondary | ICD-10-CM | POA: Diagnosis not present

## 2021-12-28 DIAGNOSIS — Z7984 Long term (current) use of oral hypoglycemic drugs: Secondary | ICD-10-CM | POA: Diagnosis not present

## 2021-12-28 DIAGNOSIS — I5030 Unspecified diastolic (congestive) heart failure: Secondary | ICD-10-CM | POA: Diagnosis not present

## 2021-12-28 DIAGNOSIS — I2510 Atherosclerotic heart disease of native coronary artery without angina pectoris: Secondary | ICD-10-CM | POA: Diagnosis not present

## 2021-12-28 DIAGNOSIS — D500 Iron deficiency anemia secondary to blood loss (chronic): Secondary | ICD-10-CM | POA: Diagnosis not present

## 2021-12-28 DIAGNOSIS — J441 Chronic obstructive pulmonary disease with (acute) exacerbation: Secondary | ICD-10-CM | POA: Diagnosis not present

## 2021-12-28 DIAGNOSIS — K922 Gastrointestinal hemorrhage, unspecified: Secondary | ICD-10-CM | POA: Diagnosis not present

## 2021-12-28 DIAGNOSIS — A419 Sepsis, unspecified organism: Secondary | ICD-10-CM | POA: Diagnosis not present

## 2021-12-28 DIAGNOSIS — J9611 Chronic respiratory failure with hypoxia: Secondary | ICD-10-CM | POA: Diagnosis not present

## 2021-12-28 DIAGNOSIS — J440 Chronic obstructive pulmonary disease with acute lower respiratory infection: Secondary | ICD-10-CM | POA: Diagnosis not present

## 2021-12-28 DIAGNOSIS — R7881 Bacteremia: Secondary | ICD-10-CM | POA: Diagnosis not present

## 2021-12-28 DIAGNOSIS — E1151 Type 2 diabetes mellitus with diabetic peripheral angiopathy without gangrene: Secondary | ICD-10-CM | POA: Diagnosis not present

## 2021-12-28 DIAGNOSIS — F17210 Nicotine dependence, cigarettes, uncomplicated: Secondary | ICD-10-CM | POA: Diagnosis not present

## 2021-12-28 DIAGNOSIS — I110 Hypertensive heart disease with heart failure: Secondary | ICD-10-CM | POA: Diagnosis not present

## 2021-12-28 DIAGNOSIS — Z9981 Dependence on supplemental oxygen: Secondary | ICD-10-CM | POA: Diagnosis not present

## 2021-12-31 DIAGNOSIS — Z7984 Long term (current) use of oral hypoglycemic drugs: Secondary | ICD-10-CM | POA: Diagnosis not present

## 2021-12-31 DIAGNOSIS — A419 Sepsis, unspecified organism: Secondary | ICD-10-CM | POA: Diagnosis not present

## 2021-12-31 DIAGNOSIS — J440 Chronic obstructive pulmonary disease with acute lower respiratory infection: Secondary | ICD-10-CM | POA: Diagnosis not present

## 2021-12-31 DIAGNOSIS — R7881 Bacteremia: Secondary | ICD-10-CM | POA: Diagnosis not present

## 2021-12-31 DIAGNOSIS — I5030 Unspecified diastolic (congestive) heart failure: Secondary | ICD-10-CM | POA: Diagnosis not present

## 2021-12-31 DIAGNOSIS — E1151 Type 2 diabetes mellitus with diabetic peripheral angiopathy without gangrene: Secondary | ICD-10-CM | POA: Diagnosis not present

## 2021-12-31 DIAGNOSIS — Z9981 Dependence on supplemental oxygen: Secondary | ICD-10-CM | POA: Diagnosis not present

## 2021-12-31 DIAGNOSIS — I110 Hypertensive heart disease with heart failure: Secondary | ICD-10-CM | POA: Diagnosis not present

## 2021-12-31 DIAGNOSIS — D500 Iron deficiency anemia secondary to blood loss (chronic): Secondary | ICD-10-CM | POA: Diagnosis not present

## 2021-12-31 DIAGNOSIS — I2510 Atherosclerotic heart disease of native coronary artery without angina pectoris: Secondary | ICD-10-CM | POA: Diagnosis not present

## 2021-12-31 DIAGNOSIS — F17210 Nicotine dependence, cigarettes, uncomplicated: Secondary | ICD-10-CM | POA: Diagnosis not present

## 2021-12-31 DIAGNOSIS — J9611 Chronic respiratory failure with hypoxia: Secondary | ICD-10-CM | POA: Diagnosis not present

## 2021-12-31 DIAGNOSIS — K922 Gastrointestinal hemorrhage, unspecified: Secondary | ICD-10-CM | POA: Diagnosis not present

## 2022-01-01 ENCOUNTER — Ambulatory Visit: Payer: Medicare Other | Admitting: *Deleted

## 2022-01-01 ENCOUNTER — Telehealth: Payer: Self-pay | Admitting: Cardiology

## 2022-01-01 DIAGNOSIS — D500 Iron deficiency anemia secondary to blood loss (chronic): Secondary | ICD-10-CM | POA: Diagnosis not present

## 2022-01-01 DIAGNOSIS — J9611 Chronic respiratory failure with hypoxia: Secondary | ICD-10-CM | POA: Diagnosis not present

## 2022-01-01 DIAGNOSIS — R7881 Bacteremia: Secondary | ICD-10-CM | POA: Diagnosis not present

## 2022-01-01 DIAGNOSIS — J440 Chronic obstructive pulmonary disease with acute lower respiratory infection: Secondary | ICD-10-CM | POA: Diagnosis not present

## 2022-01-01 DIAGNOSIS — A419 Sepsis, unspecified organism: Secondary | ICD-10-CM | POA: Diagnosis not present

## 2022-01-01 DIAGNOSIS — I110 Hypertensive heart disease with heart failure: Secondary | ICD-10-CM | POA: Diagnosis not present

## 2022-01-01 DIAGNOSIS — Z7984 Long term (current) use of oral hypoglycemic drugs: Secondary | ICD-10-CM | POA: Diagnosis not present

## 2022-01-01 DIAGNOSIS — Z9981 Dependence on supplemental oxygen: Secondary | ICD-10-CM | POA: Diagnosis not present

## 2022-01-01 DIAGNOSIS — I5030 Unspecified diastolic (congestive) heart failure: Secondary | ICD-10-CM | POA: Diagnosis not present

## 2022-01-01 DIAGNOSIS — E1151 Type 2 diabetes mellitus with diabetic peripheral angiopathy without gangrene: Secondary | ICD-10-CM | POA: Diagnosis not present

## 2022-01-01 DIAGNOSIS — F17210 Nicotine dependence, cigarettes, uncomplicated: Secondary | ICD-10-CM | POA: Diagnosis not present

## 2022-01-01 DIAGNOSIS — I2510 Atherosclerotic heart disease of native coronary artery without angina pectoris: Secondary | ICD-10-CM | POA: Diagnosis not present

## 2022-01-01 DIAGNOSIS — K922 Gastrointestinal hemorrhage, unspecified: Secondary | ICD-10-CM | POA: Diagnosis not present

## 2022-01-01 NOTE — Telephone Encounter (Signed)
Rory Percy, home health aide calling for patient. Says she had a dramatic weight increase of nearly 10 lbs, has complained of itching, may be from zetia. Please call Ivin Booty back at [phone].

## 2022-01-02 DIAGNOSIS — E875 Hyperkalemia: Secondary | ICD-10-CM | POA: Diagnosis not present

## 2022-01-03 DIAGNOSIS — F17210 Nicotine dependence, cigarettes, uncomplicated: Secondary | ICD-10-CM | POA: Diagnosis not present

## 2022-01-03 DIAGNOSIS — Z7984 Long term (current) use of oral hypoglycemic drugs: Secondary | ICD-10-CM | POA: Diagnosis not present

## 2022-01-03 DIAGNOSIS — R7881 Bacteremia: Secondary | ICD-10-CM | POA: Diagnosis not present

## 2022-01-03 DIAGNOSIS — K922 Gastrointestinal hemorrhage, unspecified: Secondary | ICD-10-CM | POA: Diagnosis not present

## 2022-01-03 DIAGNOSIS — E1151 Type 2 diabetes mellitus with diabetic peripheral angiopathy without gangrene: Secondary | ICD-10-CM | POA: Diagnosis not present

## 2022-01-03 DIAGNOSIS — I2510 Atherosclerotic heart disease of native coronary artery without angina pectoris: Secondary | ICD-10-CM | POA: Diagnosis not present

## 2022-01-03 DIAGNOSIS — J440 Chronic obstructive pulmonary disease with acute lower respiratory infection: Secondary | ICD-10-CM | POA: Diagnosis not present

## 2022-01-03 DIAGNOSIS — I110 Hypertensive heart disease with heart failure: Secondary | ICD-10-CM | POA: Diagnosis not present

## 2022-01-03 DIAGNOSIS — J9611 Chronic respiratory failure with hypoxia: Secondary | ICD-10-CM | POA: Diagnosis not present

## 2022-01-03 DIAGNOSIS — I5030 Unspecified diastolic (congestive) heart failure: Secondary | ICD-10-CM | POA: Diagnosis not present

## 2022-01-03 DIAGNOSIS — D500 Iron deficiency anemia secondary to blood loss (chronic): Secondary | ICD-10-CM | POA: Diagnosis not present

## 2022-01-03 DIAGNOSIS — Z9981 Dependence on supplemental oxygen: Secondary | ICD-10-CM | POA: Diagnosis not present

## 2022-01-03 DIAGNOSIS — A419 Sepsis, unspecified organism: Secondary | ICD-10-CM | POA: Diagnosis not present

## 2022-01-04 ENCOUNTER — Other Ambulatory Visit: Payer: Self-pay | Admitting: *Deleted

## 2022-01-04 NOTE — Telephone Encounter (Signed)
Called and spoke to patient home health aid and she advised me to speak with the pt. Called pt and she stated that she is SOB and is gaining weight. She has gained about 10 pounds or more. Her BP has been normal. She stated that the itching started about 3 weeks ago. She said the itching is terrible at night. She went to her PCP regarding these issues and he stated that her potassium was high and prescribed her a powder medication to bring down these levels. She is still gaining weight and is unsure of what to do. Please advise.

## 2022-01-04 NOTE — Patient Outreach (Signed)
  Care Coordination   Follow Up Visit Note   01/04/2022 Name: [name] MRN: [id number] DOB: 09/02/1950  [name] is a 71 y.o. year old female who sees Osei-Bonsu, Iona Beard, MD for primary care. I  unable to reach pt today.  What matters to the patients health and wellness today?  N/A   Goals Addressed   None     SDOH assessments and interventions completed:   No   Care Coordination Interventions Activated:  No Care Coordination Interventions:   No, not indicated  Follow up plan:  Will reschedule  Encounter Outcome:  No Answer   [name], RN Care Management Coordinator [place] Office [phone]

## 2022-01-04 NOTE — Telephone Encounter (Signed)
Called the patient but had to leave a voicemail.   Dr. Terri Skains

## 2022-01-05 DIAGNOSIS — J441 Chronic obstructive pulmonary disease with (acute) exacerbation: Secondary | ICD-10-CM | POA: Diagnosis not present

## 2022-01-08 DIAGNOSIS — E7800 Pure hypercholesterolemia, unspecified: Secondary | ICD-10-CM | POA: Diagnosis not present

## 2022-01-10 ENCOUNTER — Encounter: Payer: Self-pay | Admitting: *Deleted

## 2022-01-10 ENCOUNTER — Ambulatory Visit: Payer: Self-pay | Admitting: *Deleted

## 2022-01-10 NOTE — Patient Outreach (Signed)
  Care Coordination   Follow Up Visit Note   01/10/2022 Name: [name] MRN: [id number] DOB: [birth year]  [name] is a 71 y.o. year old female who sees Osei-Bonsu, Iona Beard, MD for primary care. I spoke with  [name] by phone today  What matters to the patients health and wellness today?  Nothing to address at this time.   Goals Addressed               This Visit's Progress     Nothing to address at this time (pt-stated)        Care Coordination Interventions: Advised patient to Contact her providers office with any acute needs moving forward Provided education to patient re: stress the importance of self monitoring for her CHF weights related to fluid retention (information has been sent over the last few months) and use of medication and inhalers top avoid exacerbation of her COPD (use of her IS verified). Verified pt uses home O2 on 2 liters and uses at ordered. Reviewed medications with patient and discussed purpose of all medications and verified no needed refills. Reviewed scheduled/upcoming provider appointments including 7/26 (pulmonologist hospital f/u), 8/2 ([place] pulmonologist), 9/21.         SDOH assessments and interventions completed:   Yes SDOH Interventions Today    Flowsheet Row Most Recent Value  SDOH Interventions   Food Insecurity Interventions Intervention Not Indicated  Housing Interventions Intervention Not Indicated  Transportation Interventions Intervention Not Indicated       Care Coordination Interventions Activated:  Yes Care Coordination Interventions:   Yes, provided  Follow up plan: No further intervention required.  Encounter Outcome:  Pt. Visit Completed  [name], RN Care Management Coordinator [place] Office [phone]

## 2022-01-14 DIAGNOSIS — J440 Chronic obstructive pulmonary disease with acute lower respiratory infection: Secondary | ICD-10-CM | POA: Diagnosis not present

## 2022-01-14 DIAGNOSIS — F17210 Nicotine dependence, cigarettes, uncomplicated: Secondary | ICD-10-CM | POA: Diagnosis not present

## 2022-01-14 DIAGNOSIS — A419 Sepsis, unspecified organism: Secondary | ICD-10-CM | POA: Diagnosis not present

## 2022-01-14 DIAGNOSIS — J9611 Chronic respiratory failure with hypoxia: Secondary | ICD-10-CM | POA: Diagnosis not present

## 2022-01-14 DIAGNOSIS — I110 Hypertensive heart disease with heart failure: Secondary | ICD-10-CM | POA: Diagnosis not present

## 2022-01-14 DIAGNOSIS — I5030 Unspecified diastolic (congestive) heart failure: Secondary | ICD-10-CM | POA: Diagnosis not present

## 2022-01-14 DIAGNOSIS — K922 Gastrointestinal hemorrhage, unspecified: Secondary | ICD-10-CM | POA: Diagnosis not present

## 2022-01-14 DIAGNOSIS — R7881 Bacteremia: Secondary | ICD-10-CM | POA: Diagnosis not present

## 2022-01-14 DIAGNOSIS — Z7984 Long term (current) use of oral hypoglycemic drugs: Secondary | ICD-10-CM | POA: Diagnosis not present

## 2022-01-14 DIAGNOSIS — Z9981 Dependence on supplemental oxygen: Secondary | ICD-10-CM | POA: Diagnosis not present

## 2022-01-14 DIAGNOSIS — D500 Iron deficiency anemia secondary to blood loss (chronic): Secondary | ICD-10-CM | POA: Diagnosis not present

## 2022-01-14 DIAGNOSIS — I2510 Atherosclerotic heart disease of native coronary artery without angina pectoris: Secondary | ICD-10-CM | POA: Diagnosis not present

## 2022-01-14 DIAGNOSIS — E1151 Type 2 diabetes mellitus with diabetic peripheral angiopathy without gangrene: Secondary | ICD-10-CM | POA: Diagnosis not present

## 2022-01-16 ENCOUNTER — Ambulatory Visit (INDEPENDENT_AMBULATORY_CARE_PROVIDER_SITE_OTHER): Payer: Medicare Other | Admitting: Pulmonary Disease

## 2022-01-16 ENCOUNTER — Encounter: Payer: Self-pay | Admitting: Pulmonary Disease

## 2022-01-16 VITALS — BP 98/58 | HR 57 | Ht 60.0 in | Wt 214.1 lb

## 2022-01-16 DIAGNOSIS — F17200 Nicotine dependence, unspecified, uncomplicated: Secondary | ICD-10-CM | POA: Diagnosis not present

## 2022-01-16 MED ORDER — TRELEGY ELLIPTA 100-62.5-25 MCG/ACT IN AEPB: 1.0000 | INHALATION_SPRAY | Freq: Every day | RESPIRATORY_TRACT | 0 refills | Status: DC

## 2022-01-16 NOTE — Progress Notes (Deleted)
Swelling, shortness of breath causing her to feel unwell  Very short distances before short of breath, has gotten worse over several months  No cough, it's gone Wheezing is there but better Says she is still on duoneb.  Got breo ellipta at the hospital. It's used up and not much difference sine being without it Does have albuterol neb and inhaler; uses neb 3x/day and inhaler when out and about without oxygen, not often that she's using it

## 2022-01-16 NOTE — Patient Instructions (Signed)
Thank you for visiting Dr. Valeta Harms at [facility]. Today we recommend the following:  Orders Placed This Encounter  Procedures   Ambulatory Referral for Lung Cancer Scre   Pulmonary Function Test   Meds ordered this encounter  Medications   DISCONTD: Fluticasone-Umeclidin-Vilant (TRELEGY ELLIPTA) 100-62.5-25 MCG/ACT AEPB    Sig: Inhale 1 puff into the lungs daily.    Dispense:  1 each    Refill:  0   Fluticasone-Umeclidin-Vilant (TRELEGY ELLIPTA) 100-62.5-25 MCG/ACT AEPB    Sig: Inhale 1 puff into the lungs daily.    Dispense:  1 each    Refill:  0    Order Specific Question:   Lot Number?    Answer:   [id number]    Order Specific Question:   Expiration Date?    Answer:   07/25/2023    Order Specific Question:   Manufacturer?    Answer:   [employer] [12]    Order Specific Question:   Quantity    Answer:   2   Return in about 6 weeks (around 02/27/2022) for with APP .    Please do your part to reduce the spread of COVID-19.

## 2022-01-16 NOTE — Progress Notes (Signed)
Synopsis: Referred in June 2023 for CAP by Benito Mccreedy, MD  Subjective:   PATIENT ID: [name]: female DOB: 1951-03-15, MRN: [id number]  Chief Complaint  Patient presents with   Consult    Pt consult hospital f/u, pt had pneumonia, also has COPD. Uses 2L/m at home, did not bring it today     [name] presents today for hospital follow-up from recent admission due to streptococcus pneumonia bacteremia and community-acquired pneumonia. Complicated by baseline COPD on 2L home oxygen.  She states that over the last several months she has been felling quite unwell due to increased swelling with pain in her legs and worsened dyspnea. In December she was able to ambulate freely without significant dyspnea however now struggles to make the short trip to the bathroom at home without significant dyspnea. She previously had a cough but this has dissipated. She does endorse some wheezing but states that this too has improved. She was given a DuoNeb and [place] Patter at hospital discharge however is now out of [place]. She does not feel strongly that she has noticed a difference in her symptoms without it. She uses an albuterol nebulizer three times daily and carries an albuterol inhaler with her when she leaves the home so infrequently uses this.  She did not understand why she has had such severe swelling in her legs and fluid in her lungs, nor did she know why she is on lasix. We discussed in depth why lasix helps with fluid build up both in the lungs and legs and that she has been diagnosed with heart failure. We discussed how the combination of her COPD and heart failure exacerbations can cause her breathing to be so much worse as well.  She is discouraged by the recent decline in her functional status. She is hopeful that getting a good medication regimen will better control her symptoms and allow her to regain some of her previous function. She does ask if she has to wear  her home oxygen at all times, even if she does not feel symptomatic, to which I explained that she does not.   Past Medical History:  Diagnosis Date   Anemia 12/26/2015   Anxiety    Arthritis    Blood transfusion without reported diagnosis    Chronic pain    resolved per pt 04/12/20   COPD (chronic obstructive pulmonary disease) (HCC)    Coronary artery disease    Depression    Diabetes mellitus without complication (HCC)    type 2   GERD (gastroesophageal reflux disease)    Heart murmur    since birth; Echo 02/18/19: LVEF 63%, grade 1 diastolic dysfunction, mild AS (mean grad 12 mmHg), trace MR/PR, mild TR, PASP 32 mmHg     Hyperlipemia    Hypertension    PVD (peripheral vascular disease) ([place])    right SFA stent 02/11/17 by Dr. Einar Gip   Sleep apnea    does not use cpap   Wears glasses      Family History  Problem Relation Age of Onset   Diabetes Mother    Hypertension Mother    Heart disease Mother    Heart disease Father    Hypertension Father    Hypertension Sister    Hypertension Brother    Diabetes Brother    Hypertension Sister    Hypertension Brother    Diabetes Brother    Hypertension Brother    Hypertension Brother    Hypertension Brother  Past Surgical History:  Procedure Laterality Date   ABDOMINAL HYSTERECTOMY     CARDIAC CATHETERIZATION     CATARACT EXTRACTION     CESAREAN SECTION     COLONOSCOPY N/A 01/08/2013   Procedure: COLONOSCOPY;  Surgeon: Beryle Beams, MD;  Location: [facility];  Service: Endoscopy;  Laterality: N/A;   COLONOSCOPY N/A 12/28/2015   Procedure: COLONOSCOPY;  Surgeon: Carol Ada, MD;  Location: [place];  Service: Endoscopy;  Laterality: N/A;   COLONOSCOPY WITH PROPOFOL N/A 10/05/2020   Procedure: COLONOSCOPY WITH PROPOFOL;  Surgeon: Carol Ada, MD;  [facility];  Service: Endoscopy;  Laterality: N/A;   ENTEROSCOPY N/A 12/28/2015   Procedure: ENTEROSCOPY;  Surgeon: Carol Ada, MD;  Location: [place];  Service: Endoscopy;  Laterality: N/A;   ENTEROSCOPY N/A 02/20/2018   Procedure: ENTEROSCOPY;  Surgeon: Carol Ada, MD;  [facility];  Service: Endoscopy;  Laterality: N/A;   ENTEROSCOPY N/A 03/27/2018   Procedure: ENTEROSCOPY;  Surgeon: Carol Ada, MD;  [facility];  Service: Endoscopy;  Laterality: N/A;   ENTEROSCOPY N/A 10/05/2020   Procedure: ENTEROSCOPY;  Surgeon: Carol Ada, MD;  [facility];  Service: Endoscopy;  Laterality: N/A;   ENTEROSCOPY N/A 05/11/2021   Procedure: ENTEROSCOPY;  Surgeon: Carol Ada, MD;  [facility];  Service: Endoscopy;  Laterality: N/A;   ENTEROSCOPY N/A 09/23/2021   Procedure: ENTEROSCOPY;  Surgeon: Doran Stabler, MD;  Location: [place];  Service: Gastroenterology;  Laterality: N/A;   ESOPHAGOGASTRODUODENOSCOPY N/A 01/08/2013   Procedure: ESOPHAGOGASTRODUODENOSCOPY (EGD);  Surgeon: Beryle Beams, MD;  Location: [facility];  Service: Endoscopy;  Laterality: N/A;   EYE SURGERY     GIVENS CAPSULE STUDY N/A 09/24/2021   Procedure: GIVENS CAPSULE STUDY;  Surgeon: Carol Ada, MD;  Location: [place];  Service: Gastroenterology;  Laterality: N/A;   HAND SURGERY     HEMOSTASIS CLIP PLACEMENT  05/11/2021   Procedure: HEMOSTASIS CLIP PLACEMENT;  Surgeon: Carol Ada, MD;  [facility];  Service: Endoscopy;;   HOT HEMOSTASIS N/A 12/28/2015   Procedure: HOT HEMOSTASIS (ARGON PLASMA COAGULATION/BICAP);  Surgeon: Carol Ada, MD;  Location: [facility];  Service: Endoscopy;  Laterality: N/A;   HOT HEMOSTASIS N/A 02/20/2018   Procedure: HOT HEMOSTASIS (ARGON PLASMA COAGULATION/BICAP);  Surgeon: Carol Ada, MD;  Location: [facility];  Service: Endoscopy;  Laterality: N/A;   HOT HEMOSTASIS N/A 03/27/2018   Procedure: HOT HEMOSTASIS (ARGON PLASMA COAGULATION/BICAP);  Surgeon: Carol Ada, MD;  Location: [facility];  Service: Endoscopy;  Laterality: N/A;   HOT HEMOSTASIS N/A  10/05/2020   Procedure: HOT HEMOSTASIS (ARGON PLASMA COAGULATION/BICAP);  Surgeon: Carol Ada, MD;  Location: [facility];  Service: Endoscopy;  Laterality: N/A;   HOT HEMOSTASIS N/A 05/11/2021   Procedure: HOT HEMOSTASIS (ARGON PLASMA COAGULATION/BICAP);  Surgeon: Carol Ada, MD;  Location: [facility];  Service: Endoscopy;  Laterality: N/A;   LEFT HEART CATHETERIZATION WITH CORONARY ANGIOGRAM N/A 03/09/2013   Procedure: LEFT HEART CATHETERIZATION WITH CORONARY ANGIOGRAM;  Surgeon: Laverda Page, MD;  Location: [facility] CATH LAB;  Service: Cardiovascular;  Laterality: N/A;   LOWER EXTREMITY ANGIOGRAM N/A 12/29/2012   Procedure: LOWER EXTREMITY ANGIOGRAM;  Surgeon: Laverda Page, MD;  Location: [facility] CATH LAB;  Service: Cardiovascular;  Laterality: N/A;   LOWER EXTREMITY ANGIOGRAM N/A 10/05/2013   Procedure: LOWER EXTREMITY ANGIOGRAM;  Surgeon: Laverda Page, MD;  Location: [facility] CATH LAB;  Service: Cardiovascular;  Laterality: N/A;   LOWER EXTREMITY ANGIOGRAPHY N/A 02/11/2017   Procedure: Lower Extremity  Angiography;  Surgeon: Adrian Prows, MD;  Location: [place] CV LAB;  Service: Cardiovascular;  Laterality: N/A;   PERIPHERAL VASCULAR BALLOON ANGIOPLASTY  02/11/2017   Procedure: PERIPHERAL VASCULAR BALLOON ANGIOPLASTY;  Surgeon: Adrian Prows, MD;  Location: [place] CV LAB;  Service: Cardiovascular;;  Right SFA   PERIPHERAL VASCULAR INTERVENTION Right 02/11/2017   Procedure: PERIPHERAL VASCULAR INTERVENTION;  Surgeon: Adrian Prows, MD;  Location: [place] CV LAB;  Service: Cardiovascular;  Laterality: Right;  Rt SFA   POLYPECTOMY  10/05/2020   Procedure: POLYPECTOMY;  Surgeon: Carol Ada, MD;  [facility];  Service: Endoscopy;;   stent in right leg  12/29/2012   for blood clot, Dr. Nadyne Coombes   TOTAL KNEE ARTHROPLASTY Left 04/18/2020   TOTAL KNEE ARTHROPLASTY Left 04/18/2020   Procedure: LEFT TOTAL KNEE ARTHROPLASTY;  Surgeon: Meredith Pel, MD;  Location: [place];  Service:  Orthopedics;  Laterality: Left;   TYMPANOMASTOIDECTOMY Left 03/08/2014   Procedure: TYMPANOMASTOIDECTOMY LEFT;  Surgeon: Ascencion Dike, MD;  Location: [place];  Service: ENT;  Laterality: Left;   TYMPANOMASTOIDECTOMY  2015   UTERINE FIBROID SURGERY      Social History   Socioeconomic History   Marital status: Divorced    Spouse name: Not on file   Number of children: 3   Years of education: Not on file   Highest education level: Not on file  Occupational History   Not on file  Tobacco Use   Smoking status: Former    Packs/day: 0.50    Years: 54.00    Total pack years: 27.00    Types: Cigarettes    Start date: 08/18/1966    Quit date: 05/25/2021    Years since quitting: 0.6   Smokeless tobacco: Never  Vaping Use   Vaping Use: Never used  Substance and Sexual Activity   Alcohol use: Yes    Comment: occasional   Drug use: Yes    Types: Marijuana, Cocaine    Comment: Last use 04/11/20   Sexual activity: Not Currently    Birth control/protection: Surgical    Comment: Hysterectomy  Other Topics Concern   Not on file  Social History Narrative   Not on file   Social Determinants of Health   Financial Resource Strain: Not on file  Food Insecurity: No Food Insecurity (01/10/2022)   Hunger Vital Sign    Worried About Running Out of Food in the Last Year: Never true    Ran Out of Food in the Last Year: Never true  Transportation Needs: No Transportation Needs (01/10/2022)   PRAPARE - Hydrologist (Medical): No    Lack of Transportation (Non-Medical): No  Physical Activity: Not on file  Stress: Not on file  Social Connections: Not on file  Intimate Partner Violence: Not At Risk (01/10/2022)   Humiliation, Afraid, Rape, and Kick questionnaire    Fear of Current or Ex-Partner: No    Emotionally Abused: No    Physically Abused: No    Sexually Abused: No     No Known Allergies   Outpatient Medications Prior to Visit  Medication  Sig Dispense Refill   acetaminophen (TYLENOL) 500 MG tablet Take 1,000 mg by mouth daily as needed for moderate pain.     albuterol (PROVENTIL) (2.5 MG/3ML) 0.083% nebulizer solution Take 2.5 mg by nebulization every 6 (six) hours as needed for wheezing or shortness of breath.     albuterol (VENTOLIN HFA) 108 (90 Base) MCG/ACT inhaler  Inhale 1-2 puffs into the lungs every 6 (six) hours as needed for wheezing or shortness of breath. (Patient taking differently: Inhale 2 puffs into the lungs every 6 (six) hours as needed for wheezing or shortness of breath.) 8.5 g 1   ASPIRIN LOW DOSE 81 MG EC tablet TAKE 1 TABLET(81 MG) BY MOUTH DAILY (Patient taking differently: Take 81 mg by mouth daily.) 30 tablet 0   atorvastatin (LIPITOR) 80 MG tablet Take 80 mg by mouth daily.     cefpodoxime (VANTIN) 200 MG tablet Take 1 tablet (200 mg total) by mouth 2 (two) times daily. 16 tablet 0   Cholecalciferol (VITAMIN D-3 PO) Take 1 capsule by mouth 2 (two) times daily.     ezetimibe (ZETIA) 10 MG tablet TAKE 1 TABLET BY MOUTH DAILY (Patient taking differently: Take 10 mg by mouth daily.) 90 tablet 3   ferrous sulfate 325 (65 FE) MG tablet Take 325 mg by mouth daily.     fluticasone (FLONASE) 50 MCG/ACT nasal spray Place 1 spray into both nostrils daily as needed for allergies.     furosemide (LASIX) 40 MG tablet Take 1 tablet (40 mg total) by mouth daily. 30 tablet 1   gabapentin (NEURONTIN) 600 MG tablet Take 1 tablet (600 mg total) by mouth at bedtime. 30 tablet 11   losartan (COZAAR) 25 MG tablet Take 1 tablet (25 mg total) by mouth daily. 30 tablet 0   metFORMIN (GLUCOPHAGE) 500 MG tablet Take 0.5 tablets (250 mg total) by mouth 2 (two) times daily with a meal.     metoprolol tartrate (LOPRESSOR) 25 MG tablet Take 1 tablet (25 mg total) by mouth 2 (two) times daily. 60 tablet 11   MYRBETRIQ 25 MG TB24 tablet Take 25 mg by mouth daily.     OXYGEN Inhale 2 L into the lungs continuous.     pantoprazole (PROTONIX)  40 MG tablet Take 1 tablet (40 mg total) by mouth 2 (two) times daily. (Patient taking differently: Take 40 mg by mouth daily.) 60 tablet 0   potassium chloride SA (KLOR-CON M) 20 MEQ tablet Take 1 tablet (20 mEq total) by mouth daily. 30 tablet 0   spironolactone (ALDACTONE) 50 MG tablet TAKE 1 TABLET BY MOUTH DAILY 100 tablet 2   fluticasone furoate-vilanterol (BREO ELLIPTA) 100-25 MCG/ACT AEPB Inhale 1 puff into the lungs daily. (Patient not taking: Reported on 01/16/2022) 60 each 1   ipratropium-albuterol (DUONEB) 0.5-2.5 (3) MG/3ML SOLN Take 3 mLs by nebulization 3 (three) times daily. (Patient taking differently: Take 3 mLs by nebulization 3 (three) times daily as needed (wheezing).) 270 mL 1   nitroGLYCERIN (NITROSTAT) 0.4 MG SL tablet Place 1 tablet (0.4 mg total) under the tongue every 5 (five) minutes as needed for chest pain. 90 tablet 3   No facility-administered medications prior to visit.    Review of Systems  Constitutional:  Positive for malaise/fatigue.  Respiratory:  Positive for cough, shortness of breath and wheezing.   Cardiovascular:  Positive for orthopnea and leg swelling. Negative for chest pain.  Musculoskeletal:  Positive for myalgias.  Neurological:  Positive for weakness. Negative for focal weakness.     Objective:  Constitutional:Chronically ill appearing female in no acute distress. Cardio:Regular rate and rhythm. Systolic murmur appreciated. Pulm:Mild RLL crackles. Normal work of breathing on room air. YIF:OYDXAJOIN 1+ pitting edema.  Skin:Warm and dry. Neuro:Awake, alert, and oriented. No focal deficit noted. Psych:Normal mood and affect.   Vitals:   01/16/22 1539  BP: Marland Kitchen)  98/58  Pulse: (!) 57  SpO2: 92%  Weight: 214 lb 1.6 oz (97.1 kg)  Height: 5' (1.524 m)   92% on RA at rest BMI Readings from Last 3 Encounters:  01/16/22 41.81 kg/m  12/06/21 41.56 kg/m  12/04/21 41.55 kg/m   Wt Readings from Last 3 Encounters:  01/16/22 214 lb 1.6 oz  (97.1 kg)  12/06/21 212 lb 12.8 oz (96.5 kg)  12/04/21 212 lb 11.9 oz (96.5 kg)     CBC    Component Value Date/Time   WBC 13.3 (H) 12/03/2021 0134   RBC 3.64 (L) 12/03/2021 0134   HGB 7.6 (L) 12/03/2021 0134   HGB 12.3 04/09/2018 1241   HCT 28.2 (L) 12/03/2021 0134   HCT 26.8 (L) 12/26/2015 0539   PLT 537 (H) 12/03/2021 0134   PLT 404 (H) 04/09/2018 1241   MCV 77.5 (L) 12/03/2021 0134   MCH 20.9 (L) 12/03/2021 0134   MCHC 27.0 (L) 12/03/2021 0134   RDW 20.7 (H) 12/03/2021 0134   LYMPHSABS 0.4 (L) 11/28/2021 2223   MONOABS 1.5 (H) 11/28/2021 2223   EOSABS 0.0 11/28/2021 2223   BASOSABS 0.1 11/28/2021 2223    Chest Imaging: Chest x-ray 11/2021: 1. Patchy left lower lung opacities may represent atelectasis or infection. 2.  Mild cardiomegaly is unchanged.  Pulmonary Functions Testing Results:     No data to display          Echocardiogram: 11/2021 1. Left ventricular ejection fraction, by estimation, is 55 to 60%. The  left ventricle has normal function.  2. Right ventricular systolic function is normal. The right ventricular size is normal.  3. Anterior mitral valve leaflet is thickened. 4. No large vegetations visualized. 5. Consider TEE if suspicion is high for infective endocarditis.     Assessment & Plan:     ICD-10-CM   1. Smoker  F17.200 Pulmonary Function Test    Ambulatory Referral for Lung Cancer Scre      Discussion: Patient presents to establish care for COPD and for hospital follow-up for recent CAP. She seems most unhappy about peripheral edema and though she has dyspnea that is worse from last year, this seems to be doing okay. She wears oxygen at home full-time but tolerates leaving the house without dyspnea and has adequate oxygen saturations in clinic today at rest. She is using albuterol nebulizer frequently throughout the day on top of DuoNeb. She has an extensive smoking history and has not had prior PFTs.  -Low-dose CT for lung cancer  screening -PFTs; consider referral for pulmonary rehab based on these results -Trial Trelegy -Ambulatory O2 sats--likely will need to continue to wear O2 when ambulating   Current Outpatient Medications:    acetaminophen (TYLENOL) 500 MG tablet, Take 1,000 mg by mouth daily as needed for moderate pain., Disp: , Rfl:    albuterol (PROVENTIL) (2.5 MG/3ML) 0.083% nebulizer solution, Take 2.5 mg by nebulization every 6 (six) hours as needed for wheezing or shortness of breath., Disp: , Rfl:    albuterol (VENTOLIN HFA) 108 (90 Base) MCG/ACT inhaler, Inhale 1-2 puffs into the lungs every 6 (six) hours as needed for wheezing or shortness of breath. (Patient taking differently: Inhale 2 puffs into the lungs every 6 (six) hours as needed for wheezing or shortness of breath.), Disp: 8.5 g, Rfl: 1   ASPIRIN LOW DOSE 81 MG EC tablet, TAKE 1 TABLET(81 MG) BY MOUTH DAILY (Patient taking differently: Take 81 mg by mouth daily.), Disp: 30 tablet, Rfl: 0  atorvastatin (LIPITOR) 80 MG tablet, Take 80 mg by mouth daily., Disp: , Rfl:    cefpodoxime (VANTIN) 200 MG tablet, Take 1 tablet (200 mg total) by mouth 2 (two) times daily., Disp: 16 tablet, Rfl: 0   Cholecalciferol (VITAMIN D-3 PO), Take 1 capsule by mouth 2 (two) times daily., Disp: , Rfl:    ezetimibe (ZETIA) 10 MG tablet, TAKE 1 TABLET BY MOUTH DAILY (Patient taking differently: Take 10 mg by mouth daily.), Disp: 90 tablet, Rfl: 3   ferrous sulfate 325 (65 FE) MG tablet, Take 325 mg by mouth daily., Disp: , Rfl:    fluticasone (FLONASE) 50 MCG/ACT nasal spray, Place 1 spray into both nostrils daily as needed for allergies., Disp: , Rfl:    furosemide (LASIX) 40 MG tablet, Take 1 tablet (40 mg total) by mouth daily., Disp: 30 tablet, Rfl: 1   gabapentin (NEURONTIN) 600 MG tablet, Take 1 tablet (600 mg total) by mouth at bedtime., Disp: 30 tablet, Rfl: 11   losartan (COZAAR) 25 MG tablet, Take 1 tablet (25 mg total) by mouth daily., Disp: 30 tablet, Rfl:  0   metFORMIN (GLUCOPHAGE) 500 MG tablet, Take 0.5 tablets (250 mg total) by mouth 2 (two) times daily with a meal., Disp: , Rfl:    metoprolol tartrate (LOPRESSOR) 25 MG tablet, Take 1 tablet (25 mg total) by mouth 2 (two) times daily., Disp: 60 tablet, Rfl: 11   MYRBETRIQ 25 MG TB24 tablet, Take 25 mg by mouth daily., Disp: , Rfl:    OXYGEN, Inhale 2 L into the lungs continuous., Disp: , Rfl:    pantoprazole (PROTONIX) 40 MG tablet, Take 1 tablet (40 mg total) by mouth 2 (two) times daily. (Patient taking differently: Take 40 mg by mouth daily.), Disp: 60 tablet, Rfl: 0   potassium chloride SA (KLOR-CON M) 20 MEQ tablet, Take 1 tablet (20 mEq total) by mouth daily., Disp: 30 tablet, Rfl: 0   spironolactone (ALDACTONE) 50 MG tablet, TAKE 1 TABLET BY MOUTH DAILY, Disp: 100 tablet, Rfl: 2   fluticasone furoate-vilanterol (BREO ELLIPTA) 100-25 MCG/ACT AEPB, Inhale 1 puff into the lungs daily. (Patient not taking: Reported on 01/16/2022), Disp: 60 each, Rfl: 1   ipratropium-albuterol (DUONEB) 0.5-2.5 (3) MG/3ML SOLN, Take 3 mLs by nebulization 3 (three) times daily. (Patient taking differently: Take 3 mLs by nebulization 3 (three) times daily as needed (wheezing).), Disp: 270 mL, Rfl: 1   nitroGLYCERIN (NITROSTAT) 0.4 MG SL tablet, Place 1 tablet (0.4 mg total) under the tongue every 5 (five) minutes as needed for chest pain., Disp: 90 tablet, Rfl: 3  I spent 45 minutes dedicated to the care of this patient on the date of this encounter to include pre-visit review of records, face-to-face time with the patient discussing conditions above, post visit ordering of testing, clinical documentation with the electronic health record, making appropriate referrals as documented, and communicating necessary findings to members of the patients care team.   Farrel Gordon, DO [facility] 01/16/2022 4:34 PM

## 2022-01-16 NOTE — Progress Notes (Signed)
PCCM:   This is a 71 year old female past medical history of COPD, no PFTs, coronary artery disease, type 2 diabetes, systolic murmur, hyperlipidemia, hypertension, sleep apnea not on CPAP.  Presents for hospital follow-up after being diagnosed with streptococcal pneumonia.  Discharge summary from Dr. Sloan Leiter, MD reviewed.  BP (!) 98/58   Pulse (!) 57   Ht 5' (1.524 m)   Wt 214 lb 1.6 oz (97.1 kg)   LMP  (LMP Unknown)   SpO2 92%   BMI 41.81 kg/m   General: Obese elderly female resting comfortably in chair HEENT: Tracking appropriately NCAT : Heart: Regular rate rhythm, N4-O2, systolic murmur Lungs: Clear to auscultation bilaterally no crackles no wheeze Abdomen: Soft nontender  Labs: Reviewed Chest x-ray: Bilateral lower lobe infiltrates.  Assessment: Recent streptococcal pneumonia, resolved chronic hypoxemic respiratory failure requiring nasal cannula O2 supplementation. - Not using oxygen today, we will walk to ensure no desaturations Possible COPD, no PFTs Longstanding history of tobacco use, quit in December  Plan: Ambulatory referral to lung cancer screening program Stop Breo, start Trelegy 100 New prescription and refills for Trelegy New samples for Trelegy if we have them. Walk today in the office to see if she qualifies for oxygen with exertion. Would like to have a POC portable oxygen if qualifies for O2. We will repeat a chest x-ray since I think she qualifies for lung cancer screening will be getting a CT anyways.  I agree with documentation from Farrel Gordon, DO, PGY 2.  Garner Nash, DO [facility] 01/16/2022 12:20 PM

## 2022-01-17 DIAGNOSIS — J440 Chronic obstructive pulmonary disease with acute lower respiratory infection: Secondary | ICD-10-CM | POA: Diagnosis not present

## 2022-01-17 DIAGNOSIS — E1151 Type 2 diabetes mellitus with diabetic peripheral angiopathy without gangrene: Secondary | ICD-10-CM | POA: Diagnosis not present

## 2022-01-17 DIAGNOSIS — K922 Gastrointestinal hemorrhage, unspecified: Secondary | ICD-10-CM | POA: Diagnosis not present

## 2022-01-17 DIAGNOSIS — F17210 Nicotine dependence, cigarettes, uncomplicated: Secondary | ICD-10-CM | POA: Diagnosis not present

## 2022-01-17 DIAGNOSIS — J9611 Chronic respiratory failure with hypoxia: Secondary | ICD-10-CM | POA: Diagnosis not present

## 2022-01-17 DIAGNOSIS — Z9981 Dependence on supplemental oxygen: Secondary | ICD-10-CM | POA: Diagnosis not present

## 2022-01-17 DIAGNOSIS — R7881 Bacteremia: Secondary | ICD-10-CM | POA: Diagnosis not present

## 2022-01-17 DIAGNOSIS — I5030 Unspecified diastolic (congestive) heart failure: Secondary | ICD-10-CM | POA: Diagnosis not present

## 2022-01-17 DIAGNOSIS — Z7984 Long term (current) use of oral hypoglycemic drugs: Secondary | ICD-10-CM | POA: Diagnosis not present

## 2022-01-17 DIAGNOSIS — D500 Iron deficiency anemia secondary to blood loss (chronic): Secondary | ICD-10-CM | POA: Diagnosis not present

## 2022-01-17 DIAGNOSIS — I2510 Atherosclerotic heart disease of native coronary artery without angina pectoris: Secondary | ICD-10-CM | POA: Diagnosis not present

## 2022-01-17 DIAGNOSIS — I110 Hypertensive heart disease with heart failure: Secondary | ICD-10-CM | POA: Diagnosis not present

## 2022-01-17 DIAGNOSIS — A419 Sepsis, unspecified organism: Secondary | ICD-10-CM | POA: Diagnosis not present

## 2022-01-28 DIAGNOSIS — J441 Chronic obstructive pulmonary disease with (acute) exacerbation: Secondary | ICD-10-CM | POA: Diagnosis not present

## 2022-01-29 DIAGNOSIS — A419 Sepsis, unspecified organism: Secondary | ICD-10-CM | POA: Diagnosis not present

## 2022-01-29 DIAGNOSIS — I110 Hypertensive heart disease with heart failure: Secondary | ICD-10-CM | POA: Diagnosis not present

## 2022-01-29 DIAGNOSIS — R7881 Bacteremia: Secondary | ICD-10-CM | POA: Diagnosis not present

## 2022-01-29 DIAGNOSIS — Z7984 Long term (current) use of oral hypoglycemic drugs: Secondary | ICD-10-CM | POA: Diagnosis not present

## 2022-01-29 DIAGNOSIS — J9611 Chronic respiratory failure with hypoxia: Secondary | ICD-10-CM | POA: Diagnosis not present

## 2022-01-29 DIAGNOSIS — I5030 Unspecified diastolic (congestive) heart failure: Secondary | ICD-10-CM | POA: Diagnosis not present

## 2022-01-29 DIAGNOSIS — Z9981 Dependence on supplemental oxygen: Secondary | ICD-10-CM | POA: Diagnosis not present

## 2022-01-29 DIAGNOSIS — K922 Gastrointestinal hemorrhage, unspecified: Secondary | ICD-10-CM | POA: Diagnosis not present

## 2022-01-29 DIAGNOSIS — D500 Iron deficiency anemia secondary to blood loss (chronic): Secondary | ICD-10-CM | POA: Diagnosis not present

## 2022-01-29 DIAGNOSIS — F17210 Nicotine dependence, cigarettes, uncomplicated: Secondary | ICD-10-CM | POA: Diagnosis not present

## 2022-01-29 DIAGNOSIS — I2510 Atherosclerotic heart disease of native coronary artery without angina pectoris: Secondary | ICD-10-CM | POA: Diagnosis not present

## 2022-01-29 DIAGNOSIS — E1151 Type 2 diabetes mellitus with diabetic peripheral angiopathy without gangrene: Secondary | ICD-10-CM | POA: Diagnosis not present

## 2022-01-29 DIAGNOSIS — J440 Chronic obstructive pulmonary disease with acute lower respiratory infection: Secondary | ICD-10-CM | POA: Diagnosis not present

## 2022-02-04 ENCOUNTER — Other Ambulatory Visit: Payer: Self-pay | Admitting: Internal Medicine

## 2022-02-04 ENCOUNTER — Ambulatory Visit: Payer: Self-pay | Admitting: Licensed Clinical Social Worker

## 2022-02-04 DIAGNOSIS — Z1231 Encounter for screening mammogram for malignant neoplasm of breast: Secondary | ICD-10-CM

## 2022-02-04 NOTE — Patient Outreach (Signed)
  Care Coordination   Initial Visit Note   02/04/2022 Name: [name] MRN: [id number] DOB: 04/10/1951  [name] is a 71 y.o. year old female who sees Osei-Bonsu, Iona Beard, MD for primary care. I spoke with  [name] by phone today  What matters to the patients health and wellness today?  Client wants to have adequate transport assistance. She wants to walk without being short of breath. She wants to continue to receive help as needed from her daughter. She wants to receive nursing support as needed.     Goals Addressed             This Visit's Progress    Patient wants to walk without being short of breath.  She wants to continue to receive care as needed from her daughter. She wants to have adequate transport assistance.       Care Coordination Interventions:  Active listening / Reflection utilized  Emotional Support Provided Problem [place] strategies reviewed Discussed transportation needs of client. Encouraged client to call [facility] insurance provider to talk with this provider about transport benefits for her as a [facility] recipient Discussed walking challenges. She said she does receive physical therapy support in the home Discussed breathing challenges. She said she gets short of breath when walking across a room . She uses oxygen daily as needed Discussed family support . She and her daughter reside together and she receives support from her daughter Reviewed medication procurement. Reviewed pain issues of client Discussed food procurement. She has [employer] U card and uses U card for food needs each month    SDOH assessments and interventions completed:  Yes  SDOH Interventions Today    Flowsheet Row Most Recent Value  SDOH Interventions   Physical Activity Interventions Other (Comments)  [walking challeges. uses a cane or a walker to help her walk]  Stress Interventions Provide Counseling  [client has stress related to managing medical  needs. She get short of breath easily. she fatigues easily. She is using oxygen daily as prescribed]        Care Coordination Interventions Activated:  Yes  Care Coordination Interventions:  Yes, provided   Follow up plan: Follow up call scheduled for 02/13/22 at 11:00 AM     Encounter Outcome:  Pt. Visit Completed

## 2022-02-04 NOTE — Patient Instructions (Addendum)
Visit Information  Thank you for taking time to visit with me today. Please don't hesitate to contact me if I can be of assistance to you before our next scheduled telephone appointment.  Following are the goals we discussed today:   Our next appointment is by telephone on 02/13/22 at 11:00 AM   Please call the care guide team at [phone] if you need to cancel or reschedule your appointment.   If you are experiencing a Mental Health or [place] or need someone to talk to, please go to [facility] [place] [phone])   Following is a copy of your full plan of care:   Care Coordination Interventions:  Active listening / Reflection utilized  Emotional Support Provided Problem Orange strategies reviewed Discussed transportation needs of client. Encouraged client to call [facility] insurance provider to talk with this provider about transport benefits for her as a [facility] recipient Discussed walking challenges. She said she does receive physical therapy support in the home Discussed breathing challenges. She said she gets short of breath when walking across a room . She uses oxygen daily as needed Discussed family support . She and her daughter reside together and she receives support from her daughter Reviewed medication procurement. Reviewed pain issues of client Discussed food procurement. She has [employer] U card and uses U card for food needs each month   Ms. Mcelvain was given information about Care Management services by the embedded care coordination team including:  Care Management services include personalized support from designated clinical staff supervised by her physician, including individualized plan of care and coordination with other care providers 24/7 contact phone numbers for assistance for urgent and routine care needs. The patient may stop CCM services at any time (effective at the end of the  month) by phone call to the office staff.  Patient agreed to services and verbal consent obtained.   [name].Amorie Rentz MSW, [place] Holiday representative [facility] Care Management [phone]

## 2022-02-05 DIAGNOSIS — J441 Chronic obstructive pulmonary disease with (acute) exacerbation: Secondary | ICD-10-CM | POA: Diagnosis not present

## 2022-02-07 DIAGNOSIS — R7881 Bacteremia: Secondary | ICD-10-CM | POA: Diagnosis not present

## 2022-02-07 DIAGNOSIS — F17210 Nicotine dependence, cigarettes, uncomplicated: Secondary | ICD-10-CM | POA: Diagnosis not present

## 2022-02-07 DIAGNOSIS — D500 Iron deficiency anemia secondary to blood loss (chronic): Secondary | ICD-10-CM | POA: Diagnosis not present

## 2022-02-07 DIAGNOSIS — I5030 Unspecified diastolic (congestive) heart failure: Secondary | ICD-10-CM | POA: Diagnosis not present

## 2022-02-07 DIAGNOSIS — Z7984 Long term (current) use of oral hypoglycemic drugs: Secondary | ICD-10-CM | POA: Diagnosis not present

## 2022-02-07 DIAGNOSIS — J440 Chronic obstructive pulmonary disease with acute lower respiratory infection: Secondary | ICD-10-CM | POA: Diagnosis not present

## 2022-02-07 DIAGNOSIS — E1151 Type 2 diabetes mellitus with diabetic peripheral angiopathy without gangrene: Secondary | ICD-10-CM | POA: Diagnosis not present

## 2022-02-07 DIAGNOSIS — I2510 Atherosclerotic heart disease of native coronary artery without angina pectoris: Secondary | ICD-10-CM | POA: Diagnosis not present

## 2022-02-07 DIAGNOSIS — J9611 Chronic respiratory failure with hypoxia: Secondary | ICD-10-CM | POA: Diagnosis not present

## 2022-02-07 DIAGNOSIS — K922 Gastrointestinal hemorrhage, unspecified: Secondary | ICD-10-CM | POA: Diagnosis not present

## 2022-02-07 DIAGNOSIS — I110 Hypertensive heart disease with heart failure: Secondary | ICD-10-CM | POA: Diagnosis not present

## 2022-02-07 DIAGNOSIS — A419 Sepsis, unspecified organism: Secondary | ICD-10-CM | POA: Diagnosis not present

## 2022-02-07 DIAGNOSIS — Z9981 Dependence on supplemental oxygen: Secondary | ICD-10-CM | POA: Diagnosis not present

## 2022-02-13 ENCOUNTER — Telehealth: Payer: Self-pay | Admitting: Pulmonary Disease

## 2022-02-13 ENCOUNTER — Ambulatory Visit: Payer: Self-pay | Admitting: Licensed Clinical Social Worker

## 2022-02-13 MED ORDER — TRELEGY ELLIPTA 100-62.5-25 MCG/ACT IN AEPB: 1.0000 | INHALATION_SPRAY | Freq: Every day | RESPIRATORY_TRACT | 5 refills | Status: AC

## 2022-02-13 NOTE — Patient Outreach (Signed)
  Care Coordination   Follow Up Visit Note   02/13/2022 Name: [name] MRN: [id number] DOB: [birth year]  [name] is a 71 y.o. year old female who sees [name] for primary care. I spoke with  [name] by phone today  What matters to the patients health and wellness today?  Client is looking for an apartment or ALF she can move to.  She is receiving physical therapy support in the home    Goals Addressed             This Visit's Progress    Patient Stated she is looking for an apartment or ALF where she can move to.       Care Coordination Interventions:  Solution-Focused Strategies employed:  Active listening / Reflection utilized  Emotional Support Provided Reviewed Care Coordination support services with [name] Discussed client needs. Client said she is looking for either an apartment of ALF facility where she could move. Reviewed physical therapy support. Client receives physical therapy support in home. She said she also has periodic nurse visits in the home       SDOH assessments and interventions completed:  No   Care Coordination Interventions Activated:  No  Care Coordination Interventions:  No, not indicated   Follow up plan: Follow up call scheduled for 03/15/22 at 2:30 PM with [name] (phone visit)    Encounter Outcome:  Pt. Visit Completed

## 2022-02-13 NOTE — Patient Instructions (Signed)
Visit Information  Thank you for taking time to visit with me today. Please don't hesitate to contact me if I can be of assistance to you before our next scheduled telephone appointment.  Following are the goals we discussed today:   Our next appointment is by telephone on 0922/23 at 2:30 PM   Please call the care guide team at [phone] if you need to cancel or reschedule your appointment.   If you are experiencing a Mental Health or [place] or need someone to talk to, please go to [facility] [place] [phone])   Following is a copy of your full plan of care:   Care Coordination Interventions:  Solution-Focused Strategies employed:  Active listening / Reflection utilized  Emotional Support Provided Reviewed Care Coordination support services with [name] Discussed client needs. Client said she is looking for either an apartment of ALF facility where she could move. Reviewed physical therapy support. Client receives physical therapy support in home. She said she also has periodic nurse visits in the home   [name] was given information about Care Management services by the embedded care coordination team including:  Care Management services include personalized support from designated clinical staff supervised by her physician, including individualized plan of care and coordination with other care providers 24/7 contact phone numbers for assistance for urgent and routine care needs. The patient may stop CCM services at any time (effective at the end of the month) by phone call to the office staff.  Patient agreed to services and verbal consent obtained.   [name].[name] MSW, [place] Holiday representative [facility] Care Management [phone]

## 2022-02-13 NOTE — Telephone Encounter (Signed)
Called patient and left her a voicemail that I was sending in refills of her Trelegy and that I was sending them to [facility] on [address] as requested. Nothing further needed

## 2022-02-14 DIAGNOSIS — I2510 Atherosclerotic heart disease of native coronary artery without angina pectoris: Secondary | ICD-10-CM | POA: Diagnosis not present

## 2022-02-14 DIAGNOSIS — J9611 Chronic respiratory failure with hypoxia: Secondary | ICD-10-CM | POA: Diagnosis not present

## 2022-02-14 DIAGNOSIS — K922 Gastrointestinal hemorrhage, unspecified: Secondary | ICD-10-CM | POA: Diagnosis not present

## 2022-02-14 DIAGNOSIS — Z7984 Long term (current) use of oral hypoglycemic drugs: Secondary | ICD-10-CM | POA: Diagnosis not present

## 2022-02-14 DIAGNOSIS — E1151 Type 2 diabetes mellitus with diabetic peripheral angiopathy without gangrene: Secondary | ICD-10-CM | POA: Diagnosis not present

## 2022-02-14 DIAGNOSIS — Z9981 Dependence on supplemental oxygen: Secondary | ICD-10-CM | POA: Diagnosis not present

## 2022-02-14 DIAGNOSIS — F17210 Nicotine dependence, cigarettes, uncomplicated: Secondary | ICD-10-CM | POA: Diagnosis not present

## 2022-02-14 DIAGNOSIS — I5030 Unspecified diastolic (congestive) heart failure: Secondary | ICD-10-CM | POA: Diagnosis not present

## 2022-02-14 DIAGNOSIS — D500 Iron deficiency anemia secondary to blood loss (chronic): Secondary | ICD-10-CM | POA: Diagnosis not present

## 2022-02-14 DIAGNOSIS — J449 Chronic obstructive pulmonary disease, unspecified: Secondary | ICD-10-CM | POA: Diagnosis not present

## 2022-02-14 DIAGNOSIS — I110 Hypertensive heart disease with heart failure: Secondary | ICD-10-CM | POA: Diagnosis not present

## 2022-02-21 ENCOUNTER — Other Ambulatory Visit: Payer: Self-pay

## 2022-02-21 DIAGNOSIS — J449 Chronic obstructive pulmonary disease, unspecified: Secondary | ICD-10-CM | POA: Diagnosis not present

## 2022-02-21 DIAGNOSIS — D500 Iron deficiency anemia secondary to blood loss (chronic): Secondary | ICD-10-CM | POA: Diagnosis not present

## 2022-02-21 DIAGNOSIS — I110 Hypertensive heart disease with heart failure: Secondary | ICD-10-CM | POA: Diagnosis not present

## 2022-02-21 DIAGNOSIS — I5030 Unspecified diastolic (congestive) heart failure: Secondary | ICD-10-CM | POA: Diagnosis not present

## 2022-02-21 DIAGNOSIS — E1151 Type 2 diabetes mellitus with diabetic peripheral angiopathy without gangrene: Secondary | ICD-10-CM | POA: Diagnosis not present

## 2022-02-21 DIAGNOSIS — F17210 Nicotine dependence, cigarettes, uncomplicated: Secondary | ICD-10-CM | POA: Diagnosis not present

## 2022-02-21 DIAGNOSIS — I10 Essential (primary) hypertension: Secondary | ICD-10-CM | POA: Diagnosis not present

## 2022-02-21 DIAGNOSIS — K922 Gastrointestinal hemorrhage, unspecified: Secondary | ICD-10-CM | POA: Diagnosis not present

## 2022-02-21 DIAGNOSIS — I2510 Atherosclerotic heart disease of native coronary artery without angina pectoris: Secondary | ICD-10-CM | POA: Diagnosis not present

## 2022-02-21 DIAGNOSIS — Z7984 Long term (current) use of oral hypoglycemic drugs: Secondary | ICD-10-CM | POA: Diagnosis not present

## 2022-02-21 DIAGNOSIS — K219 Gastro-esophageal reflux disease without esophagitis: Secondary | ICD-10-CM | POA: Diagnosis not present

## 2022-02-21 DIAGNOSIS — Z9981 Dependence on supplemental oxygen: Secondary | ICD-10-CM | POA: Diagnosis not present

## 2022-02-21 DIAGNOSIS — I6523 Occlusion and stenosis of bilateral carotid arteries: Secondary | ICD-10-CM

## 2022-02-21 DIAGNOSIS — J9611 Chronic respiratory failure with hypoxia: Secondary | ICD-10-CM | POA: Diagnosis not present

## 2022-02-25 ENCOUNTER — Inpatient Hospital Stay (HOSPITAL_COMMUNITY)
Admission: EM | Admit: 2022-02-25 | Discharge: 2022-03-01 | DRG: 291 | Disposition: A | Discharge status: Home Health | Payer: Medicare Other | Attending: Internal Medicine | Admitting: Internal Medicine

## 2022-02-25 ENCOUNTER — Emergency Department (HOSPITAL_COMMUNITY): Payer: Medicare Other

## 2022-02-25 ENCOUNTER — Other Ambulatory Visit: Payer: Self-pay

## 2022-02-25 ENCOUNTER — Encounter (HOSPITAL_COMMUNITY): Payer: Self-pay

## 2022-02-25 DIAGNOSIS — I2510 Atherosclerotic heart disease of native coronary artery without angina pectoris: Secondary | ICD-10-CM

## 2022-02-25 DIAGNOSIS — Z7984 Long term (current) use of oral hypoglycemic drugs: Secondary | ICD-10-CM | POA: Diagnosis not present

## 2022-02-25 DIAGNOSIS — E1151 Type 2 diabetes mellitus with diabetic peripheral angiopathy without gangrene: Secondary | ICD-10-CM | POA: Diagnosis not present

## 2022-02-25 DIAGNOSIS — J9601 Acute respiratory failure with hypoxia: Secondary | ICD-10-CM | POA: Diagnosis present

## 2022-02-25 DIAGNOSIS — I10 Essential (primary) hypertension: Secondary | ICD-10-CM | POA: Diagnosis not present

## 2022-02-25 DIAGNOSIS — J449 Chronic obstructive pulmonary disease, unspecified: Secondary | ICD-10-CM | POA: Diagnosis not present

## 2022-02-25 DIAGNOSIS — E1142 Type 2 diabetes mellitus with diabetic polyneuropathy: Secondary | ICD-10-CM | POA: Diagnosis not present

## 2022-02-25 DIAGNOSIS — Z6841 Body Mass Index (BMI) 40.0 and over, adult: Secondary | ICD-10-CM | POA: Diagnosis not present

## 2022-02-25 DIAGNOSIS — Z23 Encounter for immunization: Secondary | ICD-10-CM

## 2022-02-25 DIAGNOSIS — I5032 Chronic diastolic (congestive) heart failure: Secondary | ICD-10-CM | POA: Diagnosis present

## 2022-02-25 DIAGNOSIS — I2729 Other secondary pulmonary hypertension: Secondary | ICD-10-CM | POA: Diagnosis not present

## 2022-02-25 DIAGNOSIS — I5033 Acute on chronic diastolic (congestive) heart failure: Secondary | ICD-10-CM | POA: Diagnosis present

## 2022-02-25 DIAGNOSIS — J441 Chronic obstructive pulmonary disease with (acute) exacerbation: Secondary | ICD-10-CM | POA: Diagnosis not present

## 2022-02-25 DIAGNOSIS — Z743 Need for continuous supervision: Secondary | ICD-10-CM | POA: Diagnosis not present

## 2022-02-25 DIAGNOSIS — G8929 Other chronic pain: Secondary | ICD-10-CM | POA: Diagnosis present

## 2022-02-25 DIAGNOSIS — Z79899 Other long term (current) drug therapy: Secondary | ICD-10-CM | POA: Diagnosis not present

## 2022-02-25 DIAGNOSIS — Z8249 Family history of ischemic heart disease and other diseases of the circulatory system: Secondary | ICD-10-CM

## 2022-02-25 DIAGNOSIS — Z9981 Dependence on supplemental oxygen: Secondary | ICD-10-CM

## 2022-02-25 DIAGNOSIS — E876 Hypokalemia: Secondary | ICD-10-CM | POA: Diagnosis not present

## 2022-02-25 DIAGNOSIS — E785 Hyperlipidemia, unspecified: Secondary | ICD-10-CM

## 2022-02-25 DIAGNOSIS — Z96652 Presence of left artificial knee joint: Secondary | ICD-10-CM | POA: Diagnosis present

## 2022-02-25 DIAGNOSIS — Z20822 Contact with and (suspected) exposure to covid-19: Secondary | ICD-10-CM | POA: Diagnosis not present

## 2022-02-25 DIAGNOSIS — R0602 Shortness of breath: Secondary | ICD-10-CM | POA: Diagnosis not present

## 2022-02-25 DIAGNOSIS — J9621 Acute and chronic respiratory failure with hypoxia: Secondary | ICD-10-CM

## 2022-02-25 DIAGNOSIS — I5082 Biventricular heart failure: Secondary | ICD-10-CM | POA: Diagnosis present

## 2022-02-25 DIAGNOSIS — D509 Iron deficiency anemia, unspecified: Secondary | ICD-10-CM | POA: Diagnosis present

## 2022-02-25 DIAGNOSIS — Z90710 Acquired absence of both cervix and uterus: Secondary | ICD-10-CM

## 2022-02-25 DIAGNOSIS — Z833 Family history of diabetes mellitus: Secondary | ICD-10-CM | POA: Diagnosis not present

## 2022-02-25 DIAGNOSIS — K219 Gastro-esophageal reflux disease without esophagitis: Secondary | ICD-10-CM | POA: Diagnosis present

## 2022-02-25 DIAGNOSIS — F419 Anxiety disorder, unspecified: Secondary | ICD-10-CM | POA: Diagnosis present

## 2022-02-25 DIAGNOSIS — I739 Peripheral vascular disease, unspecified: Secondary | ICD-10-CM

## 2022-02-25 DIAGNOSIS — F32A Depression, unspecified: Secondary | ICD-10-CM | POA: Diagnosis not present

## 2022-02-25 DIAGNOSIS — I471 Supraventricular tachycardia: Secondary | ICD-10-CM | POA: Diagnosis not present

## 2022-02-25 DIAGNOSIS — I493 Ventricular premature depolarization: Secondary | ICD-10-CM | POA: Diagnosis not present

## 2022-02-25 DIAGNOSIS — Z87891 Personal history of nicotine dependence: Secondary | ICD-10-CM

## 2022-02-25 DIAGNOSIS — I110 Hypertensive heart disease with heart failure: Principal | ICD-10-CM | POA: Diagnosis present

## 2022-02-25 DIAGNOSIS — G4730 Sleep apnea, unspecified: Secondary | ICD-10-CM | POA: Diagnosis present

## 2022-02-25 DIAGNOSIS — Z79890 Hormone replacement therapy (postmenopausal): Secondary | ICD-10-CM

## 2022-02-25 LAB — CBC WITH DIFFERENTIAL/PLATELET
Abs Immature Granulocytes: 0.03 10*3/uL (ref 0.00–0.07)
Basophils Absolute: 0 10*3/uL (ref 0.0–0.1)
Basophils Relative: 1 %
Eosinophils Absolute: 0.2 10*3/uL (ref 0.0–0.5)
Eosinophils Relative: 2 %
HCT: 32.2 % — ABNORMAL LOW (ref 36.0–46.0)
Hemoglobin: 8.8 g/dL — ABNORMAL LOW (ref 12.0–15.0)
Immature Granulocytes: 0 %
Lymphocytes Relative: 17 %
Lymphs Abs: 1.2 10*3/uL (ref 0.7–4.0)
MCH: 22.4 pg — ABNORMAL LOW (ref 26.0–34.0)
MCHC: 27.3 g/dL — ABNORMAL LOW (ref 30.0–36.0)
MCV: 81.9 fL (ref 80.0–100.0)
Monocytes Absolute: 0.5 10*3/uL (ref 0.1–1.0)
Monocytes Relative: 6 %
Neutro Abs: 5.3 10*3/uL (ref 1.7–7.7)
Neutrophils Relative %: 74 %
Platelets: 476 10*3/uL — ABNORMAL HIGH (ref 150–400)
RBC: 3.93 MIL/uL (ref 3.87–5.11)
RDW: 17.6 % — ABNORMAL HIGH (ref 11.5–15.5)
WBC: 7.2 10*3/uL (ref 4.0–10.5)
nRBC: 0 % (ref 0.0–0.2)

## 2022-02-25 LAB — COMPREHENSIVE METABOLIC PANEL
ALT: 8 U/L (ref 0–44)
AST: 14 U/L — ABNORMAL LOW (ref 15–41)
Albumin: 3.3 g/dL — ABNORMAL LOW (ref 3.5–5.0)
Alkaline Phosphatase: 67 U/L (ref 38–126)
Anion gap: 8 (ref 5–15)
BUN: 12 mg/dL (ref 8–23)
CO2: 27 mmol/L (ref 22–32)
Calcium: 8.6 mg/dL — ABNORMAL LOW (ref 8.9–10.3)
Chloride: 110 mmol/L (ref 98–111)
Creatinine, Ser: 0.86 mg/dL (ref 0.44–1.00)
GFR, Estimated: >60 mL/min (ref >60)
Glucose, Bld: 108 mg/dL — ABNORMAL HIGH (ref 70–99)
Potassium: 3.3 mmol/L — ABNORMAL LOW (ref 3.5–5.1)
Sodium: 145 mmol/L (ref 135–145)
Total Bilirubin: 0.7 mg/dL (ref 0.3–1.2)
Total Protein: 7.2 g/dL (ref 6.5–8.1)

## 2022-02-25 LAB — BRAIN NATRIURETIC PEPTIDE: B Natriuretic Peptide: 178.7 pg/mL — ABNORMAL HIGH (ref 0.0–100.0)

## 2022-02-25 LAB — TROPONIN I (HIGH SENSITIVITY)
Troponin I (High Sensitivity): 12 ng/L (ref <18)
Troponin I (High Sensitivity): 5 ng/L (ref <18)

## 2022-02-25 MED ORDER — ASPIRIN 81 MG PO TBEC
81.0000 mg | DELAYED_RELEASE_TABLET | Freq: Every day | ORAL | Status: DC
  Administered 2022-02-26 – 2022-03-01 (×4): 81 mg via ORAL
  Filled 2022-02-25 (×4): qty 1

## 2022-02-25 MED ORDER — TRAZODONE HCL 50 MG PO TABS: 25.0000 mg | ORAL_TABLET | Freq: Every evening | ORAL | Status: DC | PRN

## 2022-02-25 MED ORDER — ACETAMINOPHEN 650 MG RE SUPP: 650.0000 mg | Freq: Four times a day (QID) | RECTAL | Status: DC | PRN

## 2022-02-25 MED ORDER — SPIRONOLACTONE 25 MG PO TABS
50.0000 mg | ORAL_TABLET | Freq: Every day | ORAL | Status: DC
  Administered 2022-02-26 – 2022-03-01 (×4): 50 mg via ORAL
  Filled 2022-02-25 (×4): qty 2

## 2022-02-25 MED ORDER — MAGNESIUM HYDROXIDE 400 MG/5ML PO SUSP: 30.0000 mL | Freq: Every day | ORAL | Status: DC | PRN

## 2022-02-25 MED ORDER — PANTOPRAZOLE SODIUM 40 MG PO TBEC
40.0000 mg | DELAYED_RELEASE_TABLET | Freq: Every day | ORAL | Status: DC
  Administered 2022-02-26 – 2022-03-01 (×4): 40 mg via ORAL
  Filled 2022-02-25 (×4): qty 1

## 2022-02-25 MED ORDER — ONDANSETRON HCL 4 MG PO TABS: 4.0000 mg | ORAL_TABLET | Freq: Four times a day (QID) | ORAL | Status: DC | PRN

## 2022-02-25 MED ORDER — MIRABEGRON ER 25 MG PO TB24
25.0000 mg | ORAL_TABLET | Freq: Every day | ORAL | Status: DC
  Administered 2022-02-26 – 2022-03-01 (×4): 25 mg via ORAL
  Filled 2022-02-25 (×4): qty 1

## 2022-02-25 MED ORDER — ALBUTEROL SULFATE (2.5 MG/3ML) 0.083% IN NEBU
2.5000 mg | INHALATION_SOLUTION | Freq: Four times a day (QID) | RESPIRATORY_TRACT | Status: DC | PRN
Start: 2022-02-25 — End: 2022-03-01

## 2022-02-25 MED ORDER — POTASSIUM CHLORIDE CRYS ER 20 MEQ PO TBCR: 20.0000 meq | EXTENDED_RELEASE_TABLET | Freq: Every day | ORAL | Status: DC

## 2022-02-25 MED ORDER — VITAMIN D 25 MCG (1000 UNIT) PO TABS
5000.0000 [IU] | ORAL_TABLET | Freq: Two times a day (BID) | ORAL | Status: DC
  Administered 2022-02-26 – 2022-03-01 (×8): 5000 [IU] via ORAL
  Filled 2022-02-25 (×8): qty 5

## 2022-02-25 MED ORDER — LOSARTAN POTASSIUM 25 MG PO TABS
25.0000 mg | ORAL_TABLET | Freq: Every day | ORAL | Status: DC
  Administered 2022-02-26 – 2022-03-01 (×4): 25 mg via ORAL
  Filled 2022-02-25 (×4): qty 1

## 2022-02-25 MED ORDER — FLUTICASONE PROPIONATE 50 MCG/ACT NA SUSP: 1.0000 | Freq: Every day | NASAL | Status: DC | PRN

## 2022-02-25 MED ORDER — FUROSEMIDE 10 MG/ML IJ SOLN
80.0000 mg | Freq: Once | INTRAMUSCULAR | Status: AC
  Administered 2022-02-25: 80 mg via INTRAVENOUS
  Filled 2022-02-25: qty 8

## 2022-02-25 MED ORDER — ACETAMINOPHEN 325 MG PO TABS
650.0000 mg | ORAL_TABLET | Freq: Four times a day (QID) | ORAL | Status: DC | PRN
  Administered 2022-03-01: 650 mg via ORAL
  Filled 2022-02-25: qty 2

## 2022-02-25 MED ORDER — FERROUS SULFATE 325 (65 FE) MG PO TABS
325.0000 mg | ORAL_TABLET | Freq: Every day | ORAL | Status: DC
  Administered 2022-02-26 – 2022-03-01 (×4): 325 mg via ORAL
  Filled 2022-02-25 (×4): qty 1

## 2022-02-25 MED ORDER — IPRATROPIUM-ALBUTEROL 0.5-2.5 (3) MG/3ML IN SOLN
3.0000 mL | Freq: Four times a day (QID) | RESPIRATORY_TRACT | Status: DC
  Administered 2022-02-26: 3 mL via RESPIRATORY_TRACT
  Filled 2022-02-25: qty 3

## 2022-02-25 MED ORDER — NITROGLYCERIN 0.4 MG SL SUBL: 0.4000 mg | SUBLINGUAL_TABLET | SUBLINGUAL | Status: DC | PRN

## 2022-02-25 MED ORDER — ENOXAPARIN SODIUM 40 MG/0.4ML IJ SOSY
40.0000 mg | PREFILLED_SYRINGE | INTRAMUSCULAR | Status: DC
  Administered 2022-02-26 – 2022-03-01 (×4): 40 mg via SUBCUTANEOUS
  Filled 2022-02-25 (×4): qty 0.4

## 2022-02-25 MED ORDER — ATORVASTATIN CALCIUM 40 MG PO TABS
80.0000 mg | ORAL_TABLET | Freq: Every day | ORAL | Status: DC
  Administered 2022-02-26 – 2022-03-01 (×4): 80 mg via ORAL
  Filled 2022-02-25 (×4): qty 2

## 2022-02-25 MED ORDER — ONDANSETRON HCL 4 MG/2ML IJ SOLN: 4.0000 mg | Freq: Four times a day (QID) | INTRAMUSCULAR | Status: DC | PRN

## 2022-02-25 MED ORDER — FUROSEMIDE 10 MG/ML IJ SOLN: 60.0000 mg | Freq: Two times a day (BID) | INTRAMUSCULAR | Status: DC

## 2022-02-25 MED ORDER — METOPROLOL TARTRATE 25 MG PO TABS
25.0000 mg | ORAL_TABLET | Freq: Two times a day (BID) | ORAL | Status: DC
  Administered 2022-02-26 – 2022-03-01 (×7): 25 mg via ORAL
  Filled 2022-02-25 (×8): qty 1

## 2022-02-25 MED ORDER — GABAPENTIN 300 MG PO CAPS
600.0000 mg | ORAL_CAPSULE | Freq: Every day | ORAL | Status: DC
  Administered 2022-02-26 – 2022-02-28 (×3): 600 mg via ORAL
  Filled 2022-02-25 (×4): qty 2

## 2022-02-25 MED ORDER — EZETIMIBE 10 MG PO TABS
10.0000 mg | ORAL_TABLET | Freq: Every day | ORAL | Status: DC
  Administered 2022-02-26 – 2022-03-01 (×4): 10 mg via ORAL
  Filled 2022-02-25 (×4): qty 1

## 2022-02-25 NOTE — ED Notes (Signed)
ED TO INPATIENT HANDOFF REPORT  ED Nurse Name and Phone #: Elpidio Eric [phone]  S Name/Age/Gender [name] 71 y.o. female Room/Bed: WA21/WA21  Code Status   Code Status: Full Code  Home/SNF/Other Home Patient oriented to: self, place, time, and situation Is this baseline? Yes   Triage Complete: Triage complete  Chief Complaint Acute on chronic diastolic CHF (congestive heart failure) ([place]) [I50.33]  Triage Note PT BIB EMS from home for SOB. Per EMS pt's home was very hot. Pt tried to go to the bathroom then got hot and SOB.   Hx COPD and Heart Murmur Home oxygen at 3L  150/70 4L 93 128 cbg    Allergies No Known Allergies  Level of Care/Admitting Diagnosis ED Disposition     ED Disposition  Admit   Condition  --   Comment  Hospital Area: [place] [[id number]]  Level of Care: Telemetry [5]  Admit to tele based on following criteria: Other see comments  Comments: CHF  May admit patient to [facility] or [facility] if equivalent level of care is available:: No  Covid Evaluation: Asymptomatic - no recent exposure (last 10 days) testing not required  Diagnosis: Acute on chronic diastolic CHF (congestive heart failure) [facility]) [147829]  Admitting Physician: Christel Mormon [[id number]]  Attending Physician: Christel Mormon [[id number]]  Certification:: I certify this patient will need inpatient services for at least 2 midnights  Estimated Length of Stay: 2          B Medical/Surgery History Past Medical History:  Diagnosis Date   Anemia 12/26/2015   Anxiety    Arthritis    Blood transfusion without reported diagnosis    Chronic pain    resolved per pt 04/12/20   COPD (chronic obstructive pulmonary disease) ([place])    Coronary artery disease    Depression    Diabetes mellitus without complication (HCC)    type 2   GERD (gastroesophageal reflux disease)    Heart murmur    since birth; Echo 02/18/19: LVEF 62%, grade 1 diastolic  dysfunction, mild AS (mean grad 12 mmHg), trace MR/PR, mild TR, PASP 32 mmHg     Hyperlipemia    Hypertension    PVD (peripheral vascular disease) ([place])    right SFA stent 02/11/17 by Dr. Einar Gip   Sleep apnea    does not use cpap   Wears glasses    Past Surgical History:  Procedure Laterality Date   ABDOMINAL HYSTERECTOMY     CARDIAC CATHETERIZATION     CATARACT EXTRACTION     CESAREAN SECTION     COLONOSCOPY N/A 01/08/2013   Procedure: COLONOSCOPY;  Surgeon: Beryle Beams, MD;  [facility]: [facility];  Service: Endoscopy;  Laterality: N/A;   COLONOSCOPY N/A 12/28/2015   Procedure: COLONOSCOPY;  Surgeon: Carol Ada, MD;  Location: [place];  Service: Endoscopy;  Laterality: N/A;   COLONOSCOPY WITH PROPOFOL N/A 10/05/2020   Procedure: COLONOSCOPY WITH PROPOFOL;  Surgeon: Carol Ada, MD;  [facility];  Service: Endoscopy;  Laterality: N/A;   ENTEROSCOPY N/A 12/28/2015   Procedure: ENTEROSCOPY;  Surgeon: Carol Ada, MD;  Location: [place];  Service: Endoscopy;  Laterality: N/A;   ENTEROSCOPY N/A 02/20/2018   Procedure: ENTEROSCOPY;  Surgeon: Carol Ada, MD;  [facility];  Service: Endoscopy;  Laterality: N/A;   ENTEROSCOPY N/A 03/27/2018   Procedure: ENTEROSCOPY;  Surgeon: Carol Ada, MD;  [facility];  Service: Endoscopy;  Laterality: N/A;   ENTEROSCOPY N/A 10/05/2020  Procedure: ENTEROSCOPY;  Surgeon: Carol Ada, MD;  [facility];  Service: Endoscopy;  Laterality: N/A;   ENTEROSCOPY N/A 05/11/2021   Procedure: ENTEROSCOPY;  Surgeon: Carol Ada, MD;  [facility];  Service: Endoscopy;  Laterality: N/A;   ENTEROSCOPY N/A 09/23/2021   Procedure: ENTEROSCOPY;  Surgeon: Doran Stabler, MD;  Location: [place];  Service: Gastroenterology;  Laterality: N/A;   ESOPHAGOGASTRODUODENOSCOPY N/A 01/08/2013   Procedure: ESOPHAGOGASTRODUODENOSCOPY (EGD);  Surgeon: Beryle Beams, MD;  [facility];  Service:  Endoscopy;  Laterality: N/A;   EYE SURGERY     GIVENS CAPSULE STUDY N/A 09/24/2021   Procedure: GIVENS CAPSULE STUDY;  Surgeon: Carol Ada, MD;  Location: [place];  Service: Gastroenterology;  Laterality: N/A;   HAND SURGERY     HEMOSTASIS CLIP PLACEMENT  05/11/2021   Procedure: HEMOSTASIS CLIP PLACEMENT;  Surgeon: Carol Ada, MD;  [facility];  Service: Endoscopy;;   HOT HEMOSTASIS N/A 12/28/2015   Procedure: HOT HEMOSTASIS (ARGON PLASMA COAGULATION/BICAP);  Surgeon: Carol Ada, MD;  [facility] ENDOSCOPY;  Service: Endoscopy;  Laterality: N/A;   HOT HEMOSTASIS N/A 02/20/2018   Procedure: HOT HEMOSTASIS (ARGON PLASMA COAGULATION/BICAP);  Surgeon: Carol Ada, MD;  [facility];  Service: Endoscopy;  Laterality: N/A;   HOT HEMOSTASIS N/A 03/27/2018   Procedure: HOT HEMOSTASIS (ARGON PLASMA COAGULATION/BICAP);  Surgeon: Carol Ada, MD;  [facility];  Service: Endoscopy;  Laterality: N/A;   HOT HEMOSTASIS N/A 10/05/2020   Procedure: HOT HEMOSTASIS (ARGON PLASMA COAGULATION/BICAP);  Surgeon: Carol Ada, MD;  Location: [facility];  Service: Endoscopy;  Laterality: N/A;   HOT HEMOSTASIS N/A 05/11/2021   Procedure: HOT HEMOSTASIS (ARGON PLASMA COAGULATION/BICAP);  Surgeon: Carol Ada, MD;  Location[facility];  Service: Endoscopy;  Laterality: N/A;   LEFT HEART CATHETERIZATION WITH CORONARY ANGIOGRAM N/A 03/09/2013   Procedure: LEFT HEART CATHETERIZATION WITH CORONARY ANGIOGRAM;  Surgeon: Laverda Page, MD;  Location: [facility] CATH LAB;  Service: Cardiovascular;  Laterality: N/A;   LOWER EXTREMITY ANGIOGRAM N/A 12/29/2012   Procedure: LOWER EXTREMITY ANGIOGRAM;  Surgeon: Laverda Page, MD;  Location: [facility] CATH LAB;  Service: Cardiovascular;  Laterality: N/A;   LOWER EXTREMITY ANGIOGRAM N/A 10/05/2013   Procedure: LOWER EXTREMITY ANGIOGRAM;  Surgeon: Laverda Page, MD;  Location: [facility] CATH LAB;  Service: Cardiovascular;  Laterality: N/A;   LOWER  EXTREMITY ANGIOGRAPHY N/A 02/11/2017   Procedure: Lower Extremity Angiography;  Surgeon: Adrian Prows, MD;  Location: [place] CV LAB;  Service: Cardiovascular;  Laterality: N/A;   PERIPHERAL VASCULAR BALLOON ANGIOPLASTY  02/11/2017   Procedure: PERIPHERAL VASCULAR BALLOON ANGIOPLASTY;  Surgeon: Adrian Prows, MD;  Location: [place] CV LAB;  Service: Cardiovascular;;  Right SFA   PERIPHERAL VASCULAR INTERVENTION Right 02/11/2017   Procedure: PERIPHERAL VASCULAR INTERVENTION;  Surgeon: Adrian Prows, MD;  Location: [place] CV LAB;  Service: Cardiovascular;  Laterality: Right;  Rt SFA   POLYPECTOMY  10/05/2020   Procedure: POLYPECTOMY;  Surgeon: Carol Ada, MD;  [facility];  Service: Endoscopy;;   stent in right leg  12/29/2012   for blood clot, Dr. Nadyne Coombes   TOTAL KNEE ARTHROPLASTY Left 04/18/2020   TOTAL KNEE ARTHROPLASTY Left 04/18/2020   Procedure: LEFT TOTAL KNEE ARTHROPLASTY;  Surgeon: Meredith Pel, MD;  Location: [place];  Service: Orthopedics;  Laterality: Left;   TYMPANOMASTOIDECTOMY Left 03/08/2014   Procedure: TYMPANOMASTOIDECTOMY LEFT;  Surgeon: Ascencion Dike, MD;  Location: [place];  Service: ENT;  Laterality: Left;   TYMPANOMASTOIDECTOMY  2015   UTERINE FIBROID SURGERY       A IV Location/Drains/Wounds Patient Lines/Drains/Airways Status     Active Line/Drains/Airways     Name Placement date Placement time Site Days   External Urinary Catheter 09/23/21  2356  --  155   External Urinary Catheter 11/30/21  1900  --  87            Intake/Output Last 24 hours No intake or output data in the 24 hours ending 02/25/22 2346  Labs/Imaging Results for orders placed or performed during the hospital encounter of 02/25/22 (from the past 48 hour(s))  Comprehensive metabolic panel     Status: Abnormal   Collection Time: 02/25/22  9:45 PM  Result Value Ref Range   Sodium 145 135 - 145 mmol/L   Potassium 3.3 (L) 3.5 - 5.1 mmol/L   Chloride 110 98 -  111 mmol/L   CO2 27 22 - 32 mmol/L   Glucose, Bld 108 (H) 70 - 99 mg/dL    Comment: Glucose reference range applies only to samples taken after fasting for at least 8 hours.   BUN 12 8 - 23 mg/dL   Creatinine, Ser 0.86 0.44 - 1.00 mg/dL   Calcium 8.6 (L) 8.9 - 10.3 mg/dL   Total Protein 7.2 6.5 - 8.1 g/dL   Albumin 3.3 (L) 3.5 - 5.0 g/dL   AST 14 (L) 15 - 41 U/L   ALT 8 0 - 44 U/L   Alkaline Phosphatase 67 38 - 126 U/L   Total Bilirubin 0.7 0.3 - 1.2 mg/dL   GFR, Estimated >60 >60 mL/min    Comment: (NOTE) Calculated using the CKD-EPI Creatinine Equation (2021)    Anion gap 8 5 - 15    Comment: Performed at [facility], [place] [address] [place], [place] [address]  CBC with Differential     Status: Abnormal   Collection Time: 02/25/22  9:45 PM  Result Value Ref Range   WBC 7.2 4.0 - 10.5 K/uL   RBC 3.93 3.87 - 5.11 MIL/uL   Hemoglobin 8.8 (L) 12.0 - 15.0 g/dL   HCT 32.2 (L) 36.0 - 46.0 %   MCV 81.9 80.0 - 100.0 fL   MCH 22.4 (L) 26.0 - 34.0 pg   MCHC 27.3 (L) 30.0 - 36.0 g/dL   RDW 17.6 (H) 11.5 - 15.5 %   Platelets 476 (H) 150 - 400 K/uL   nRBC 0.0 0.0 - 0.2 %   Neutrophils Relative % 74 %   Neutro Abs 5.3 1.7 - 7.7 K/uL   Lymphocytes Relative 17 %   Lymphs Abs 1.2 0.7 - 4.0 K/uL   Monocytes Relative 6 %   Monocytes Absolute 0.5 0.1 - 1.0 K/uL   Eosinophils Relative 2 %   Eosinophils Absolute 0.2 0.0 - 0.5 K/uL   Basophils Relative 1 %   Basophils Absolute 0.0 0.0 - 0.1 K/uL   Immature Granulocytes 0 %   Abs Immature Granulocytes 0.03 0.00 - 0.07 K/uL    Comment: Performed at [facility], [place] [address] [place], [place] [address]  Troponin I (High Sensitivity)     Status: None   Collection Time: 02/25/22  9:45 PM  Result Value Ref Range   Troponin I (High Sensitivity) 12 <18 ng/L    Comment: (NOTE) Elevated high sensitivity troponin I (hsTnI) values and significant  changes across serial measurements may suggest ACS but many  other  chronic and acute conditions are known to  elevate hsTnI results.  Refer to the "Links" section for chest pain algorithms and additional  guidance. Performed at [facility], [place] [address], [place], [place] [address]   Brain natriuretic peptide     Status: Abnormal   Collection Time: 02/25/22  9:45 PM  Result Value Ref Range   B Natriuretic Peptide 178.7 (H) 0.0 - 100.0 pg/mL    Comment: Performed at [facility], [place] [address] [place], [place] [address]  Troponin I (High Sensitivity)     Status: None   Collection Time: 02/25/22 11:10 PM  Result Value Ref Range   Troponin I (High Sensitivity) 5 <18 ng/L    Comment: (NOTE) Elevated high sensitivity troponin I (hsTnI) values and significant  changes across serial measurements may suggest ACS but many other  chronic and acute conditions are known to elevate hsTnI results.  Refer to the "Links" section for chest pain algorithms and additional  guidance. Performed at [facility], [place] [address], [place], [place] [address]    DG Chest Port 1 View  Result Date: 02/25/2022 CLINICAL DATA:  Shortness of breath.  History of COPD. EXAM: PORTABLE CHEST 1 VIEW COMPARISON:  Radiograph 11/29/2018. FINDINGS: Cardiomegaly, mild increase. Stable mediastinal contours. Progression in diffuse peribronchial thickening from prior. No large pleural effusion. Mild scarring at the left lung base. No pneumothorax or confluent consolidation. IMPRESSION: 1. Progression in diffuse peribronchial thickening from prior exam, pulmonary edema versus acute bronchitis/COPD exacerbation. 2. Slight increase in cardiomegaly. Electronically Signed   By: Keith Rake M.D.   On: 02/25/2022 21:54    Pending Labs Unresulted Labs (From admission, onward)     Start     Ordered   02/26/22 1324  Basic metabolic panel  Tomorrow morning,   R        02/25/22 2344   02/26/22 0500  CBC  Tomorrow morning,   R         02/25/22 2344   02/25/22 2326  SARS Coronavirus 2 by RT PCR (hospital order, performed in [facility] hospital lab) *cepheid single result test* Anterior Nasal Swab  Once,   URGENT        02/25/22 2325            Vitals/Pain Today's Vitals   02/25/22 1955 02/25/22 1956 02/25/22 1959 02/25/22 2215  BP:   (!) 162/76 136/68  Pulse:   80 (!) 111  Resp:   (!) 30 (!) 24  Temp:   98.2 F (36.8 C)   TempSrc:   Oral   SpO2: 90%  94% 98%  Weight:  101.6 kg    Height:  5' (1.524 m)    PainSc:  0-No pain      Isolation Precautions No active isolations  Medications Medications  aspirin EC tablet 81 mg (has no administration in time range)  atorvastatin (LIPITOR) tablet 80 mg (has no administration in time range)  ezetimibe (ZETIA) tablet 10 mg (has no administration in time range)  losartan (COZAAR) tablet 25 mg (has no administration in time range)  metoprolol tartrate (LOPRESSOR) tablet 25 mg (has no administration in time range)  nitroGLYCERIN (NITROSTAT) SL tablet 0.4 mg (has no administration in time range)  spironolactone (ALDACTONE) tablet 50 mg (has no administration in time range)  pantoprazole (PROTONIX) EC tablet 40 mg (has no administration in time range)  mirabegron ER (MYRBETRIQ) tablet 25 mg (has no administration in time range)  ferrous sulfate tablet 325 mg (has no administration in  time range)  gabapentin (NEURONTIN) tablet 600 mg (has no administration in time range)  Vitamin D-3 TABS (has no administration in time range)  potassium chloride SA (KLOR-CON M) CR tablet 20 mEq (has no administration in time range)  albuterol (PROVENTIL) (2.5 MG/3ML) 0.083% nebulizer solution 2.5 mg (has no administration in time range)  fluticasone (FLONASE) 50 MCG/ACT nasal spray 1 spray (has no administration in time range)  ipratropium-albuterol (DUONEB) 0.5-2.5 (3) MG/3ML nebulizer solution 3 mL (has no administration in time range)  furosemide (LASIX) injection 60 mg (has no  administration in time range)  enoxaparin (LOVENOX) injection 40 mg (has no administration in time range)  acetaminophen (TYLENOL) tablet 650 mg (has no administration in time range)    Or  acetaminophen (TYLENOL) suppository 650 mg (has no administration in time range)  traZODone (DESYREL) tablet 25 mg (has no administration in time range)  magnesium hydroxide (MILK OF MAGNESIA) suspension 30 mL (has no administration in time range)  ondansetron (ZOFRAN) tablet 4 mg (has no administration in time range)    Or  ondansetron (ZOFRAN) injection 4 mg (has no administration in time range)  furosemide (LASIX) injection 80 mg (80 mg Intravenous Given 02/25/22 2301)    Mobility walks Moderate fall risk   Focused Assessments    R Recommendations: See Admitting Provider Note  Report given to:   Additional Notes:

## 2022-02-25 NOTE — H&P (Signed)
PATIENT NAME: [name]    MR#:  [id number]  DATE OF BIRTH:  [birth year]  DATE OF ADMISSION:  02/25/2022  PRIMARY CARE PHYSICIAN: Benito Mccreedy, MD   Patient is coming from: Home  REQUESTING/REFERRING PHYSICIAN: Malvin Johns, MD  CHIEF COMPLAINT:   Chief Complaint  Patient presents with   Shortness of Breath    HISTORY OF PRESENT ILLNESS:  [name] is a 71 y.o. African-American female with medical history significant for COPD, diastolic CHF, coronary artery disease, depression, type diabetes mellitus, GERD, hypertension and dyslipidemia as well as sleep apnea, presented to the emergency room with acute onset of worsening dyspnea over the last 3 days with associated lower extremity edema.  She admits to paroxysmal nocturnal dyspnea as well as dyspnea on exertion, occasional wheezing without cough.  No fever or chills.  She denies any chest pain or palpitations.  She required 4 L rather than her baseline 3 L of home O2 today.  No dysuria, oliguria or hematuria or flank pain.  No nausea or vomiting or abdominal pain.  ED Course: When she came to the ER, BP was 162/76 with respiratory rate of 30 and pulse symmetry was 94% on 4 L of O2 by nasal cannula.  Labs revealed hypokalemia of 3.3 with a BNP of 178.7 and CBC showed anemia with hemoglobin 8.8 hematocrit 32.2 better than previous levels. EKG as reviewed by me : EKG showed sinus rhythm with ectopy with a rate of 73 with PVCs and Q waves anteriorly. Imaging: Portable chest ray showed progression and diffuse peribronchial thickening from prior exam, pulmonary edema versus acute bronchitis/COPD exacerbation with slight increase in cardiomegaly.  The patient was given 80 mg of IV Lasix.  She will be admitted to a telemetry bed for further evaluation and management. PAST MEDICAL HISTORY:   Past Medical History:  Diagnosis Date   Anemia 12/26/2015   Anxiety    Arthritis    Blood transfusion  without reported diagnosis    Chronic pain    resolved per pt 04/12/20   COPD (chronic obstructive pulmonary disease) (HCC)    Coronary artery disease    Depression    Diabetes mellitus without complication (HCC)    type 2   GERD (gastroesophageal reflux disease)    Heart murmur    since birth; Echo 02/18/19: LVEF 56%, grade 1 diastolic dysfunction, mild AS (mean grad 12 mmHg), trace MR/PR, mild TR, PASP 32 mmHg     Hyperlipemia    Hypertension    PVD (peripheral vascular disease) ([place])    right SFA stent 02/11/17 by Dr. Einar Gip   Sleep apnea    does not use cpap   Wears glasses     PAST SURGICAL HISTORY:   Past Surgical History:  Procedure Laterality Date   ABDOMINAL HYSTERECTOMY     CARDIAC CATHETERIZATION     CATARACT EXTRACTION     CESAREAN SECTION     COLONOSCOPY N/A 01/08/2013   Procedure: COLONOSCOPY;  Surgeon: Beryle Beams, MD;  Location: [facility];  Service: Endoscopy;  Laterality: N/A;   COLONOSCOPY N/A 12/28/2015   Procedure: COLONOSCOPY;  Surgeon: Carol Ada, MD;  Location: [place];  Service: Endoscopy;  Laterality: N/A;   COLONOSCOPY WITH PROPOFOL N/A 10/05/2020   Procedure: COLONOSCOPY WITH PROPOFOL;  Surgeon: Carol Ada, MD;  Location: [facility];  Service: Endoscopy;  Laterality: N/A;   ENTEROSCOPY N/A 12/28/2015   Procedure: ENTEROSCOPY;  Surgeon: Carol Ada, MD;  Location: [facility];  Service: Endoscopy;  Laterality: N/A;   ENTEROSCOPY N/A 02/20/2018   Procedure: ENTEROSCOPY;  Surgeon: Carol Ada, MD;  Location: [facility];  Service: Endoscopy;  Laterality: N/A;   ENTEROSCOPY N/A 03/27/2018   Procedure: ENTEROSCOPY;  Surgeon: Carol Ada, MD;  [facility]: [facility];  Service: Endoscopy;  Laterality: N/A;   ENTEROSCOPY N/A 10/05/2020   Procedure: ENTEROSCOPY;  Surgeon: Carol Ada, MD;  [facility]: [facility];  Service: Endoscopy;  Laterality: N/A;   ENTEROSCOPY N/A 05/11/2021   Procedure: ENTEROSCOPY;  Surgeon: Carol Ada, MD;   Location: [facility];  Service: Endoscopy;  Laterality: N/A;   ENTEROSCOPY N/A 09/23/2021   Procedure: ENTEROSCOPY;  Surgeon: Doran Stabler, MD;  Location: [facility];  Service: Gastroenterology;  Laterality: N/A;   ESOPHAGOGASTRODUODENOSCOPY N/A 01/08/2013   Procedure: ESOPHAGOGASTRODUODENOSCOPY (EGD);  Surgeon: Beryle Beams, MD;  Location: [facility];  Service: Endoscopy;  Laterality: N/A;   EYE SURGERY     GIVENS CAPSULE STUDY N/A 09/24/2021   Procedure: GIVENS CAPSULE STUDY;  Surgeon: Carol Ada, MD;  Location: [place];  Service: Gastroenterology;  Laterality: N/A;   HAND SURGERY     HEMOSTASIS CLIP PLACEMENT  05/11/2021   Procedure: HEMOSTASIS CLIP PLACEMENT;  Surgeon: Carol Ada, MD;  [facility];  Service: Endoscopy;;   HOT HEMOSTASIS N/A 12/28/2015   Procedure: HOT HEMOSTASIS (ARGON PLASMA COAGULATION/BICAP);  Surgeon: Carol Ada, MD;  Location: [facility] ENDOSCOPY;  Service: Endoscopy;  Laterality: N/A;   HOT HEMOSTASIS N/A 02/20/2018   Procedure: HOT HEMOSTASIS (ARGON PLASMA COAGULATION/BICAP);  Surgeon: Carol Ada, MD;  Location: [facility];  Service: Endoscopy;  Laterality: N/A;   HOT HEMOSTASIS N/A 03/27/2018   Procedure: HOT HEMOSTASIS (ARGON PLASMA COAGULATION/BICAP);  Surgeon: Carol Ada, MD;  Location: [facility];  Service: Endoscopy;  Laterality: N/A;   HOT HEMOSTASIS N/A 10/05/2020   Procedure: HOT HEMOSTASIS (ARGON PLASMA COAGULATION/BICAP);  Surgeon: Carol Ada, MD;  Location: [facility];  Service: Endoscopy;  Laterality: N/A;   HOT HEMOSTASIS N/A 05/11/2021   Procedure: HOT HEMOSTASIS (ARGON PLASMA COAGULATION/BICAP);  Surgeon: Carol Ada, MD;  Location: [facility];  Service: Endoscopy;  Laterality: N/A;   LEFT HEART CATHETERIZATION WITH CORONARY ANGIOGRAM N/A 03/09/2013   Procedure: LEFT HEART CATHETERIZATION WITH CORONARY ANGIOGRAM;  Surgeon: Laverda Page, MD;  Location: [facility] CATH LAB;  Service: Cardiovascular;  Laterality: N/A;    LOWER EXTREMITY ANGIOGRAM N/A 12/29/2012   Procedure: LOWER EXTREMITY ANGIOGRAM;  Surgeon: Laverda Page, MD;  Location: [facility] CATH LAB;  Service: Cardiovascular;  Laterality: N/A;   LOWER EXTREMITY ANGIOGRAM N/A 10/05/2013   Procedure: LOWER EXTREMITY ANGIOGRAM;  Surgeon: Laverda Page, MD;  Location: [facility] CATH LAB;  Service: Cardiovascular;  Laterality: N/A;   LOWER EXTREMITY ANGIOGRAPHY N/A 02/11/2017   Procedure: Lower Extremity Angiography;  Surgeon: Adrian Prows, MD;  Location: [place] CV LAB;  Service: Cardiovascular;  Laterality: N/A;   PERIPHERAL VASCULAR BALLOON ANGIOPLASTY  02/11/2017   Procedure: PERIPHERAL VASCULAR BALLOON ANGIOPLASTY;  Surgeon: Adrian Prows, MD;  Location: [place] CV LAB;  Service: Cardiovascular;;  Right SFA   PERIPHERAL VASCULAR INTERVENTION Right 02/11/2017   Procedure: PERIPHERAL VASCULAR INTERVENTION;  Surgeon: Adrian Prows, MD;  Location: [place] CV LAB;  Service: Cardiovascular;  Laterality: Right;  Rt SFA   POLYPECTOMY  10/05/2020   Procedure: POLYPECTOMY;  Surgeon: Carol Ada, MD;  Location: [facility];  Service: Endoscopy;;   stent in right leg  12/29/2012   for blood clot, Dr. Nadyne Coombes   TOTAL KNEE  ARTHROPLASTY Left 04/18/2020   TOTAL KNEE ARTHROPLASTY Left 04/18/2020   Procedure: LEFT TOTAL KNEE ARTHROPLASTY;  Surgeon: Meredith Pel, MD;  Location: [place];  Service: Orthopedics;  Laterality: Left;   TYMPANOMASTOIDECTOMY Left 03/08/2014   Procedure: TYMPANOMASTOIDECTOMY LEFT;  Surgeon: Ascencion Dike, MD;  Location: [place];  Service: ENT;  Laterality: Left;   TYMPANOMASTOIDECTOMY  2015   UTERINE FIBROID SURGERY      SOCIAL HISTORY:   Social History   Tobacco Use   Smoking status: Former    Packs/day: 0.50    Years: 54.00    Total pack years: 27.00    Types: Cigarettes    Start date: 08/18/1966    Quit date: 05/25/2021    Years since quitting: 0.7   Smokeless tobacco: Never  Substance Use Topics   Alcohol use: Yes     Comment: occasional    FAMILY HISTORY:   Family History  Problem Relation Age of Onset   Diabetes Mother    Hypertension Mother    Heart disease Mother    Heart disease Father    Hypertension Father    Hypertension Sister    Hypertension Brother    Diabetes Brother    Hypertension Sister    Hypertension Brother    Diabetes Brother    Hypertension Brother    Hypertension Brother    Hypertension Brother     DRUG ALLERGIES:  No Known Allergies  REVIEW OF SYSTEMS:   ROS As per history of present illness. All pertinent systems were reviewed above. Constitutional, HEENT, cardiovascular, respiratory, GI, GU, musculoskeletal, neuro, psychiatric, endocrine, integumentary and hematologic systems were reviewed and are otherwise negative/unremarkable except for positive findings mentioned above in the HPI.   MEDICATIONS AT HOME:   Prior to Admission medications   Medication Sig Start Date End Date Taking? Authorizing Provider  acetaminophen (TYLENOL) 500 MG tablet Take 1,000 mg by mouth daily as needed for moderate pain.    [provider]  albuterol (PROVENTIL) (2.5 MG/3ML) 0.083% nebulizer solution Take 2.5 mg by nebulization every 6 (six) hours as needed for wheezing or shortness of breath.    [provider]  albuterol (VENTOLIN HFA) 108 (90 Base) MCG/ACT inhaler Inhale 1-2 puffs into the lungs every 6 (six) hours as needed for wheezing or shortness of breath. Patient taking differently: Inhale 2 puffs into the lungs every 6 (six) hours as needed for wheezing or shortness of breath. 11/13/21   Adhikari, Tamsen Meek, MD  ASPIRIN LOW DOSE 81 MG EC tablet TAKE 1 TABLET(81 MG) BY MOUTH DAILY Patient taking differently: Take 81 mg by mouth daily. 06/30/20   Magnant, Charles L, PA-C  atorvastatin (LIPITOR) 80 MG tablet Take 80 mg by mouth daily. 08/08/20   [provider]  cefpodoxime (VANTIN) 200 MG tablet Take 1 tablet (200 mg total) by mouth 2 (two) times daily.  12/04/21   Carlyle Basques, MD  Cholecalciferol (VITAMIN D-3 PO) Take 1 capsule by mouth 2 (two) times daily.    [provider]  ezetimibe (ZETIA) 10 MG tablet TAKE 1 TABLET BY MOUTH DAILY Patient taking differently: Take 10 mg by mouth daily. 10/15/21   [name], [name], DO  ferrous sulfate 325 (65 FE) MG tablet Take 325 mg by mouth daily.    [provider]  fluticasone (FLONASE) 50 MCG/ACT nasal spray Place 1 spray into both nostrils daily as needed for allergies. 08/14/21   [provider]  fluticasone furoate-vilanterol (BREO ELLIPTA) 100-25 MCG/ACT AEPB Inhale  1 puff into the lungs daily. Patient not taking: Reported on 01/16/2022 11/13/21   Shelly Coss, MD  Fluticasone-Umeclidin-Vilant (TRELEGY ELLIPTA) 100-62.5-25 MCG/ACT AEPB Inhale 1 puff into the lungs daily. 02/13/22   Icard, Octavio Graves, DO  furosemide (LASIX) 40 MG tablet Take 1 tablet (40 mg total) by mouth daily. 11/13/21 01/16/22  Shelly Coss, MD  gabapentin (NEURONTIN) 600 MG tablet Take 1 tablet (600 mg total) by mouth at bedtime. 10/02/17   Hyatt, Max T, DPM  ipratropium-albuterol (DUONEB) 0.5-2.5 (3) MG/3ML SOLN Take 3 mLs by nebulization 3 (three) times daily. Patient taking differently: Take 3 mLs by nebulization 3 (three) times daily as needed (wheezing). 11/13/21 01/12/22  Shelly Coss, MD  losartan (COZAAR) 25 MG tablet Take 1 tablet (25 mg total) by mouth daily. 11/13/21 11/13/22  Shelly Coss, MD  metFORMIN (GLUCOPHAGE) 500 MG tablet Take 0.5 tablets (250 mg total) by mouth 2 (two) times daily with a meal. 02/13/17   Adrian Prows, MD  metoprolol tartrate (LOPRESSOR) 25 MG tablet Take 1 tablet (25 mg total) by mouth 2 (two) times daily. 11/13/21 11/13/22  Shelly Coss, MD  MYRBETRIQ 25 MG TB24 tablet Take 25 mg by mouth daily. 08/10/21   [provider]  nitroGLYCERIN (NITROSTAT) 0.4 MG SL tablet Place 1 tablet (0.4 mg total) under the tongue every 5 (five) minutes as needed for chest pain.  01/13/19 12/29/21  Miquel Dunn, NP  OXYGEN Inhale 2 L into the lungs continuous.    [provider]  pantoprazole (PROTONIX) 40 MG tablet Take 1 tablet (40 mg total) by mouth 2 (two) times daily. Patient taking differently: Take 40 mg by mouth daily. 09/25/21   Thurnell Lose, MD  potassium chloride SA (KLOR-CON M) 20 MEQ tablet Take 1 tablet (20 mEq total) by mouth daily. 11/13/21   Shelly Coss, MD  spironolactone (ALDACTONE) 50 MG tablet TAKE 1 TABLET BY MOUTH DAILY 12/17/21   [name], [name], DO      VITAL SIGNS:  Blood pressure 137/60, pulse 63, temperature 98.3 F (36.8 C), temperature source Oral, resp. rate 20, height 5' (1.524 m), weight 101.6 kg, SpO2 98 %.  PHYSICAL EXAMINATION:  Physical Exam  GENERAL:  71 y.o.-year-old African-American female patient semi-lying in the bed with moderate respiratory distress with conversational dyspnea.  EYES: Pupils equal, round, reactive to light and accommodation. No scleral icterus. Extraocular muscles intact.  HEENT: Head atraumatic, normocephalic. Oropharynx and nasopharynx clear.  NECK:  Supple, no jugular venous distention. No thyroid enlargement, no tenderness.  LUNGS: Diminished bibasilar breath sounds with bibasal rales.  No use of accessory muscles of respiration.  CARDIOVASCULAR: Regular rate and rhythm, S1, S2 normal. No murmurs, rubs, or gallops.  ABDOMEN: Soft, nondistended, nontender. Bowel sounds present. No organomegaly or mass.  EXTREMITIES: 2+ bilateral lower extremity pitting edema with no cyanosis, or clubbing.  NEUROLOGIC: Cranial nerves II through XII are intact. Muscle strength 5/5 in all extremities. Sensation intact. Gait not checked.  PSYCHIATRIC: The patient is alert and oriented x 3.  Normal affect and good eye contact. SKIN: No obvious rash, lesion, or ulcer.   LABORATORY PANEL:   CBC Recent Labs  Lab 02/25/22 2145  WBC 7.2  HGB 8.8*  HCT 32.2*  PLT 476*    ------------------------------------------------------------------------------------------------------------------  Chemistries  Recent Labs  Lab 02/25/22 2145  NA 145  K 3.3*  CL 110  CO2 27  GLUCOSE 108*  BUN 12  CREATININE 0.86  CALCIUM 8.6*  AST 14*  ALT 8  ALKPHOS 67  BILITOT 0.7   ------------------------------------------------------------------------------------------------------------------  Cardiac Enzymes No results for input(s): "TROPONINI" in the last 168 hours. ------------------------------------------------------------------------------------------------------------------  RADIOLOGY:  DG Chest Port 1 View  Result Date: 02/25/2022 CLINICAL DATA:  Shortness of breath.  History of COPD. EXAM: PORTABLE CHEST 1 VIEW COMPARISON:  Radiograph 11/29/2018. FINDINGS: Cardiomegaly, mild increase. Stable mediastinal contours. Progression in diffuse peribronchial thickening from prior. No large pleural effusion. Mild scarring at the left lung base. No pneumothorax or confluent consolidation. IMPRESSION: 1. Progression in diffuse peribronchial thickening from prior exam, pulmonary edema versus acute bronchitis/COPD exacerbation. 2. Slight increase in cardiomegaly. Electronically Signed   By: Keith Rake M.D.   On: 02/25/2022 21:54      IMPRESSION AND PLAN:  Assessment and Plan: * Acute on chronic diastolic CHF (congestive heart failure) ([place]) - The patient will be admitted to a telemetry bed. - We will continue diuresis with IV Lasix. -Her most recent 2D echo on 12/03/2021 revealed an EF of 55 to 60% with thickened anterior mitral valve leaflets. - We will follow I's and O's and daily weights. - We will follow serial troponins.  Acute on chronic respiratory failure with hypoxia (HCC) - This is clearly secondary to #1. - O2 protocol will be followed. - Management otherwise as above.  Coronary artery disease - We will continue with blocker therapy with Toprol-XL  as well as as needed sublingual nitroglycerin and aspirin in addition to statin therapy as mentioned above.  Essential hypertension - We will continue her antihypertensives.  PAD (peripheral artery disease) (HCC) - We will Pletal and aspirin  COPD without exacerbation ([place]) -We will hold off Trelegy given acute CHF and place the patient on scheduled and as needed DuoNebs.  Type 2 diabetes mellitus with peripheral neuropathy (HCC) - We will place the patient on supplemental NovoLog. - We will continue Neurontin.   Dyslipidemia - We will continue statin therapy as well as Zetia.       DVT prophylaxis: Lovenox.  Advanced Care Planning:  Code Status: full code.  Family Communication:  The plan of care was discussed in details with the patient (and family). I answered all questions. The patient agreed to proceed with the above mentioned plan. Further management will depend upon hospital course. Disposition Plan: Back to previous home environment Consults called: none.  All the records are reviewed and case discussed with ED provider.  Status is: Inpatient    At the time of the admission, it appears that the appropriate admission status for this patient is inpatient.  This is judged to be reasonable and necessary in order to provide the required intensity of service to ensure the patient's safety given the presenting symptoms, physical exam findings and initial radiographic and laboratory data in the context of comorbid conditions.  The patient requires inpatient status due to high intensity of service, high risk of further deterioration and high frequency of surveillance required.  I certify that at the time of admission, it is my clinical judgment that the patient will require inpatient hospital care extending more than 2 midnights.                            Dispo: The patient is from: Home              Anticipated d/c is to: Home              Patient currently is not medically  stable to d/c.  Difficult to place patient: No  Christel Mormon M.D on 02/26/2022 at 3:09 AM  [facility]   From 7 PM-7 AM, contact night-coverage [email or web address]  CC: Primary care physician; Benito Mccreedy, MD

## 2022-02-25 NOTE — ED Provider Notes (Signed)
[facility] DEPT Provider Note   CSN: [id number] Arrival date & time: 02/25/22  1946     History  Chief Complaint  Patient presents with   Shortness of Breath    [name] is a 71 y.o. female.  Patient is a 71 year old female who presents with shortness of breath.  She has a history of COPD, CHF, hypertension.  She reports a 3-day history of worsening shortness of breath.  She says she is at the point where she is having a hard time even walking to the bathroom which is very short distance from her bedroom.  She denies any associated cough or congestion.  No chest discomfort.  She has had some increase in her baseline leg swelling.  She is on home oxygen at 3 L/min.  She denies any fevers.  She has been using her albuterol nebulizer machine at home with some improvement of symptoms but still remains short of breath.       Home Medications Prior to Admission medications   Medication Sig Start Date End Date Taking? Authorizing Provider  acetaminophen (TYLENOL) 500 MG tablet Take 1,000 mg by mouth daily as needed for moderate pain.    [provider]  albuterol (PROVENTIL) (2.5 MG/3ML) 0.083% nebulizer solution Take 2.5 mg by nebulization every 6 (six) hours as needed for wheezing or shortness of breath.    [provider]  albuterol (VENTOLIN HFA) 108 (90 Base) MCG/ACT inhaler Inhale 1-2 puffs into the lungs every 6 (six) hours as needed for wheezing or shortness of breath. Patient taking differently: Inhale 2 puffs into the lungs every 6 (six) hours as needed for wheezing or shortness of breath. 11/13/21   Adhikari, Tamsen Meek, MD  ASPIRIN LOW DOSE 81 MG EC tablet TAKE 1 TABLET(81 MG) BY MOUTH DAILY Patient taking differently: Take 81 mg by mouth daily. 06/30/20   Magnant, Charles L, PA-C  atorvastatin (LIPITOR) 80 MG tablet Take 80 mg by mouth daily. 08/08/20   [provider]  cefpodoxime (VANTIN) 200 MG tablet Take 1 tablet  (200 mg total) by mouth 2 (two) times daily. 12/04/21   Carlyle Basques, MD  Cholecalciferol (VITAMIN D-3 PO) Take 1 capsule by mouth 2 (two) times daily.    [provider]  ezetimibe (ZETIA) 10 MG tablet TAKE 1 TABLET BY MOUTH DAILY Patient taking differently: Take 10 mg by mouth daily. 10/15/21   [name], [name], DO  ferrous sulfate 325 (65 FE) MG tablet Take 325 mg by mouth daily.    [provider]  fluticasone (FLONASE) 50 MCG/ACT nasal spray Place 1 spray into both nostrils daily as needed for allergies. 08/14/21   [provider]  fluticasone furoate-vilanterol (BREO ELLIPTA) 100-25 MCG/ACT AEPB Inhale 1 puff into the lungs daily. Patient not taking: Reported on 01/16/2022 11/13/21   Shelly Coss, MD  Fluticasone-Umeclidin-Vilant (TRELEGY ELLIPTA) 100-62.5-25 MCG/ACT AEPB Inhale 1 puff into the lungs daily. 02/13/22   Icard, Octavio Graves, DO  furosemide (LASIX) 40 MG tablet Take 1 tablet (40 mg total) by mouth daily. 11/13/21 01/16/22  Shelly Coss, MD  gabapentin (NEURONTIN) 600 MG tablet Take 1 tablet (600 mg total) by mouth at bedtime. 10/02/17   Hyatt, Max T, DPM  ipratropium-albuterol (DUONEB) 0.5-2.5 (3) MG/3ML SOLN Take 3 mLs by nebulization 3 (three) times daily. Patient taking differently: Take 3 mLs by nebulization 3 (three) times daily as needed (wheezing). 11/13/21 01/12/22  Shelly Coss, MD  losartan (COZAAR) 25 MG tablet Take 1 tablet (25  mg total) by mouth daily. 11/13/21 11/13/22  Shelly Coss, MD  metFORMIN (GLUCOPHAGE) 500 MG tablet Take 0.5 tablets (250 mg total) by mouth 2 (two) times daily with a meal. 02/13/17   Adrian Prows, MD  metoprolol tartrate (LOPRESSOR) 25 MG tablet Take 1 tablet (25 mg total) by mouth 2 (two) times daily. 11/13/21 11/13/22  Shelly Coss, MD  MYRBETRIQ 25 MG TB24 tablet Take 25 mg by mouth daily. 08/10/21   [provider]  nitroGLYCERIN (NITROSTAT) 0.4 MG SL tablet Place 1 tablet (0.4 mg total) under the tongue every 5  (five) minutes as needed for chest pain. 01/13/19 12/29/21  Miquel Dunn, NP  OXYGEN Inhale 2 L into the lungs continuous.    [provider]  pantoprazole (PROTONIX) 40 MG tablet Take 1 tablet (40 mg total) by mouth 2 (two) times daily. Patient taking differently: Take 40 mg by mouth daily. 09/25/21   Thurnell Lose, MD  potassium chloride SA (KLOR-CON M) 20 MEQ tablet Take 1 tablet (20 mEq total) by mouth daily. 11/13/21   Shelly Coss, MD  spironolactone (ALDACTONE) 50 MG tablet TAKE 1 TABLET BY MOUTH DAILY 12/17/21   [name], [name], DO      Allergies    Patient has no known allergies.    Review of Systems   Review of Systems  Constitutional:  Positive for fatigue. Negative for chills, diaphoresis and fever.  HENT:  Negative for congestion, rhinorrhea and sneezing.   Eyes: Negative.   Respiratory:  Positive for shortness of breath. Negative for cough and chest tightness.   Cardiovascular:  Positive for leg swelling. Negative for chest pain.  Gastrointestinal:  Negative for abdominal pain, blood in stool, diarrhea, nausea and vomiting.  Genitourinary:  Negative for difficulty urinating, flank pain, frequency and hematuria.  Musculoskeletal:  Negative for arthralgias and back pain.  Skin:  Negative for rash.  Neurological:  Negative for dizziness, speech difficulty, weakness, numbness and headaches.    Physical Exam Updated Vital Signs BP 136/68   Pulse (!) 111   Temp 98.2 F (36.8 C) (Oral)   Resp (!) 24   Ht 5' (1.524 m)   Wt 101.6 kg   LMP  (LMP Unknown)   SpO2 98%   BMI 43.75 kg/m  Physical Exam Constitutional:      Appearance: She is well-developed.  HENT:     Head: Normocephalic and atraumatic.  Eyes:     Pupils: Pupils are equal, round, and reactive to light.  Cardiovascular:     Rate and Rhythm: Normal rate and regular rhythm.     Heart sounds: Normal heart sounds.  Pulmonary:     Effort: Pulmonary effort is normal. Tachypnea present. No  respiratory distress.     Breath sounds: Decreased breath sounds and rales present. No wheezing.  Chest:     Chest wall: No tenderness.  Abdominal:     General: Bowel sounds are normal.     Palpations: Abdomen is soft.     Tenderness: There is no abdominal tenderness. There is no guarding or rebound.  Musculoskeletal:        General: Normal range of motion.     Cervical back: Normal range of motion and neck supple.     Comments: 3+ pitting edema to lower extremities bilaterally  Lymphadenopathy:     Cervical: No cervical adenopathy.  Skin:    General: Skin is warm and dry.     Findings: No rash.  Neurological:     Mental  Status: She is alert and oriented to person, place, and time.     ED Results / Procedures / Treatments   Labs (all labs ordered are listed, but only abnormal results are displayed) Labs Reviewed  COMPREHENSIVE METABOLIC PANEL - Abnormal; Notable for the following components:      Result Value   Potassium 3.3 (*)    Glucose, Bld 108 (*)    Calcium 8.6 (*)    Albumin 3.3 (*)    AST 14 (*)    All other components within normal limits  CBC WITH DIFFERENTIAL/PLATELET - Abnormal; Notable for the following components:   Hemoglobin 8.8 (*)    HCT 32.2 (*)    MCH 22.4 (*)    MCHC 27.3 (*)    RDW 17.6 (*)    Platelets 476 (*)    All other components within normal limits  SARS CORONAVIRUS 2 BY RT PCR  BRAIN NATRIURETIC PEPTIDE  TROPONIN I (HIGH SENSITIVITY)  TROPONIN I (HIGH SENSITIVITY)    EKG EKG Interpretation  Date/Time:  Monday February 25 2022 19:58:43 EDT Ventricular Rate:  73 PR Interval:    QRS Duration: 120 QT Interval:  382 QTC Calculation: 421 R Axis:   77 Text Interpretation: probable sinus rhythm with ectopy Paired ventricular premature complexes IVCD, consider atypical RBBB Consider anterior infarct Minimal ST elevation, inferior leads Baseline wander in lead(s) II Confirmed by Malvin Johns ([id number]) on 02/25/2022 10:30:32  PM  Radiology DG Chest Port 1 View  Result Date: 02/25/2022 CLINICAL DATA:  Shortness of breath.  History of COPD. EXAM: PORTABLE CHEST 1 VIEW COMPARISON:  Radiograph 11/29/2018. FINDINGS: Cardiomegaly, mild increase. Stable mediastinal contours. Progression in diffuse peribronchial thickening from prior. No large pleural effusion. Mild scarring at the left lung base. No pneumothorax or confluent consolidation. IMPRESSION: 1. Progression in diffuse peribronchial thickening from prior exam, pulmonary edema versus acute bronchitis/COPD exacerbation. 2. Slight increase in cardiomegaly. Electronically Signed   By: Keith Rake M.D.   On: 02/25/2022 21:54    Procedures Procedures    Medications Ordered in ED Medications  furosemide (LASIX) injection 80 mg (80 mg Intravenous Given 02/25/22 2301)    ED Course/ Medical Decision Making/ A&P                           Medical Decision Making Amount and/or Complexity of Data Reviewed Labs: ordered. Radiology: ordered.  Risk Prescription drug management.   Patient is a 71 year old female who presents with shortness of breath.  She has a history of COPD and CHF.  Her lungs sound congested.  She has had some increased leg swelling.  No fevers.  No history of chest pain.  No ischemic changes on EKG.  Troponin is negative.  This x-ray was performed which was interpreted by me and confirmed by the radiologist to show some increased lung markings consistent with probable pulmonary edema.  COPD cannot be ruled out.  No obvious infiltrate.  She was given a dose of IV Lasix.  She is urinated quite a bit per her report.  She feels like her breathing is better.  Her oxygen is at 4 L/min currently.  She baseline has 3 L/min.  We will plan admission for further treatment. I spoke with Dr. Sidney Ace who will admit the pt.  Final Clinical Impression(s) / ED Diagnoses Final diagnoses:  SOB (shortness of breath)  Acute respiratory failure with hypoxia ([place])    Rx  / DC Orders ED Discharge  Orders     None         Malvin Johns, MD 02/25/22 2336

## 2022-02-25 NOTE — ED Triage Notes (Signed)
PT BIB EMS from home for SOB. Per EMS pt's home was very hot. Pt tried to go to the bathroom then got hot and SOB.   Hx COPD and Heart Murmur Home oxygen at 3L  150/70 4L 93 128 cbg

## 2022-02-26 ENCOUNTER — Other Ambulatory Visit: Payer: Medicare Other

## 2022-02-26 ENCOUNTER — Encounter: Payer: Self-pay | Admitting: Internal Medicine

## 2022-02-26 DIAGNOSIS — J449 Chronic obstructive pulmonary disease, unspecified: Secondary | ICD-10-CM

## 2022-02-26 DIAGNOSIS — J9621 Acute and chronic respiratory failure with hypoxia: Secondary | ICD-10-CM

## 2022-02-26 DIAGNOSIS — D509 Iron deficiency anemia, unspecified: Secondary | ICD-10-CM

## 2022-02-26 DIAGNOSIS — I5033 Acute on chronic diastolic (congestive) heart failure: Secondary | ICD-10-CM | POA: Diagnosis not present

## 2022-02-26 DIAGNOSIS — E785 Hyperlipidemia, unspecified: Secondary | ICD-10-CM

## 2022-02-26 DIAGNOSIS — E1142 Type 2 diabetes mellitus with diabetic polyneuropathy: Secondary | ICD-10-CM

## 2022-02-26 DIAGNOSIS — J9601 Acute respiratory failure with hypoxia: Secondary | ICD-10-CM | POA: Diagnosis present

## 2022-02-26 LAB — FERRITIN: Ferritin: 12 ng/mL (ref 11–307)

## 2022-02-26 LAB — CBC
HCT: 33.2 % — ABNORMAL LOW (ref 36.0–46.0)
Hemoglobin: 9.1 g/dL — ABNORMAL LOW (ref 12.0–15.0)
MCH: 22.2 pg — ABNORMAL LOW (ref 26.0–34.0)
MCHC: 27.4 g/dL — ABNORMAL LOW (ref 30.0–36.0)
MCV: 81.2 fL (ref 80.0–100.0)
Platelets: 505 10*3/uL — ABNORMAL HIGH (ref 150–400)
RBC: 4.09 MIL/uL (ref 3.87–5.11)
RDW: 17.6 % — ABNORMAL HIGH (ref 11.5–15.5)
WBC: 7.6 10*3/uL (ref 4.0–10.5)
nRBC: 0 % (ref 0.0–0.2)

## 2022-02-26 LAB — BASIC METABOLIC PANEL
Anion gap: 8 (ref 5–15)
BUN: 11 mg/dL (ref 8–23)
CO2: 32 mmol/L (ref 22–32)
Calcium: 8.8 mg/dL — ABNORMAL LOW (ref 8.9–10.3)
Chloride: 107 mmol/L (ref 98–111)
Creatinine, Ser: 0.74 mg/dL (ref 0.44–1.00)
GFR, Estimated: >60 mL/min (ref >60)
Glucose, Bld: 105 mg/dL — ABNORMAL HIGH (ref 70–99)
Potassium: 2.9 mmol/L — ABNORMAL LOW (ref 3.5–5.1)
Sodium: 147 mmol/L — ABNORMAL HIGH (ref 135–145)

## 2022-02-26 LAB — IRON AND TIBC
Iron: 28 ug/dL (ref 28–170)
Saturation Ratios: 6 % — ABNORMAL LOW (ref 10.4–31.8)
TIBC: 460 ug/dL — ABNORMAL HIGH (ref 250–450)
UIBC: 432 ug/dL

## 2022-02-26 LAB — MAGNESIUM: Magnesium: 1.3 mg/dL — ABNORMAL LOW (ref 1.7–2.4)

## 2022-02-26 LAB — GLUCOSE, CAPILLARY
Glucose-Capillary: 116 mg/dL — ABNORMAL HIGH (ref 70–99)
Glucose-Capillary: 111 mg/dL — ABNORMAL HIGH (ref 70–99)
Glucose-Capillary: 79 mg/dL (ref 70–99)
Glucose-Capillary: 124 mg/dL — ABNORMAL HIGH (ref 70–99)

## 2022-02-26 LAB — SARS CORONAVIRUS 2 BY RT PCR: SARS Coronavirus 2 by RT PCR: NEGATIVE

## 2022-02-26 MED ORDER — POTASSIUM CHLORIDE CRYS ER 20 MEQ PO TBCR
40.0000 meq | EXTENDED_RELEASE_TABLET | Freq: Once | ORAL | Status: AC
Start: 2022-02-26 — End: 2022-02-26
  Administered 2022-02-26: 40 meq via ORAL
  Filled 2022-02-26: qty 2

## 2022-02-26 MED ORDER — POTASSIUM CHLORIDE CRYS ER 20 MEQ PO TBCR
40.0000 meq | EXTENDED_RELEASE_TABLET | ORAL | Status: AC
  Administered 2022-02-26 (×2): 40 meq via ORAL
  Filled 2022-02-26 (×2): qty 2

## 2022-02-26 MED ORDER — INSULIN ASPART 100 UNIT/ML IJ SOLN: 0.0000 [IU] | Freq: Every day | INTRAMUSCULAR | Status: DC

## 2022-02-26 MED ORDER — CILOSTAZOL 100 MG PO TABS
50.0000 mg | ORAL_TABLET | Freq: Two times a day (BID) | ORAL | Status: DC
  Administered 2022-02-26 – 2022-03-01 (×6): 50 mg via ORAL
  Filled 2022-02-26 (×6): qty 1

## 2022-02-26 MED ORDER — IPRATROPIUM-ALBUTEROL 0.5-2.5 (3) MG/3ML IN SOLN: 3.0000 mL | Freq: Three times a day (TID) | RESPIRATORY_TRACT | Status: DC | PRN

## 2022-02-26 MED ORDER — NA FERRIC GLUC CPLX IN SUCROSE 12.5 MG/ML IV SOLN
250.0000 mg | Freq: Once | INTRAVENOUS | Status: AC
  Administered 2022-02-26: 250 mg via INTRAVENOUS
  Filled 2022-02-26: qty 20

## 2022-02-26 MED ORDER — FUROSEMIDE 10 MG/ML IJ SOLN
40.0000 mg | Freq: Two times a day (BID) | INTRAMUSCULAR | Status: DC
  Administered 2022-02-26 – 2022-02-28 (×5): 40 mg via INTRAVENOUS
  Filled 2022-02-26 (×6): qty 4

## 2022-02-26 MED ORDER — IPRATROPIUM-ALBUTEROL 0.5-2.5 (3) MG/3ML IN SOLN
3.0000 mL | Freq: Three times a day (TID) | RESPIRATORY_TRACT | Status: DC
  Administered 2022-02-26 – 2022-03-01 (×9): 3 mL via RESPIRATORY_TRACT
  Filled 2022-02-26 (×9): qty 3

## 2022-02-26 MED ORDER — MAGNESIUM SULFATE 4 GM/100ML IV SOLN
4.0000 g | Freq: Once | INTRAVENOUS | Status: AC
  Administered 2022-02-26: 4 g via INTRAVENOUS
  Filled 2022-02-26: qty 100

## 2022-02-26 MED ORDER — INSULIN ASPART 100 UNIT/ML IJ SOLN
0.0000 [IU] | Freq: Three times a day (TID) | INTRAMUSCULAR | Status: DC
  Administered 2022-02-27 – 2022-03-01 (×4): 2 [IU] via SUBCUTANEOUS

## 2022-02-26 NOTE — Assessment & Plan Note (Signed)
-   We will continue with blocker therapy with Toprol-XL as well as as needed sublingual nitroglycerin and aspirin in addition to statin therapy as mentioned above.

## 2022-02-26 NOTE — Assessment & Plan Note (Signed)
-  We will hold off Trelegy given acute CHF and place the patient on scheduled and as needed DuoNebs.

## 2022-02-26 NOTE — Assessment & Plan Note (Signed)
-   We will Pletal and aspirin

## 2022-02-26 NOTE — Progress Notes (Signed)
PT states she has Trelegy from home. This RT left Sticky Note for MD to order so it will appear on [facility] for documentation, (as RT cannot order or discontinue maintenance medications).

## 2022-02-26 NOTE — TOC Initial Note (Addendum)
Transition of Care [facility]) - Initial/Assessment Note    Patient Details  Name: [name] MRN: [id number] Date of Birth: [birth year]  Transition of Care [facility]) CM/SW Contact:    Roseanne Kaufman, RN Phone Number: 02/26/2022, 4:06 PM  Clinical Narrative:     Jackquline [place] consult for CHF screening, patient qualifies for CHF protocol.  Pharmacy: [facility]  Prior to admission DME: home oxygen with [employer], walker ordered with her [place]          Patient wants a rolling walker or wheelchair, advised to discuss with MD. Transportation at discharge: daughter Zerita Boers will transport her home        TOC will continue to follow.   Expected Discharge Plan: [place] Barriers to Discharge: Continued Medical Work up   Patient Goals and CMS Choice        Expected Discharge Plan and Services Expected Discharge Plan: [place] In-house Referral: NA Discharge Planning Services: CM Consult   Living arrangements for the past 2 months: Single Family Home                 DME Arranged: Walker rolling                    Prior Living Arrangements/Services Living arrangements for the past 2 months: Single Family Home Lives with:: Self Patient language and need for interpreter reviewed:: Yes        Need for Family Participation in Patient Care: Yes (Comment) Care giver support system in place?: Yes (comment) Current home services: DME (walker, home oxygen with [employer]) Criminal Activity/Legal Involvement Pertinent to Current Situation/Hospitalization: No - Comment as needed  Activities of Daily Living Home Assistive Devices/Equipment: Oxygen, Walker (specify type) ADL Screening (condition at time of admission) Patient's cognitive ability adequate to safely complete daily activities?: Yes Is the patient deaf or have difficulty hearing?: No Does the patient have difficulty seeing, even when wearing  glasses/contacts?: No Does the patient have difficulty concentrating, remembering, or making decisions?: No Patient able to express need for assistance with ADLs?: Yes Does the patient have difficulty dressing or bathing?: No Independently performs ADLs?: Yes (appropriate for developmental age) Does the patient have difficulty walking or climbing stairs?: No Weakness of Legs: None Weakness of Arms/Hands: None  Permission Sought/Granted Permission sought to share information with : Case Manager Permission granted to share information with : Yes, Verbal Permission Granted  Share Information with NAME: Case Manager           Emotional Assessment Appearance:: Appears stated age Attitude/Demeanor/Rapport: Gracious Affect (typically observed): Accepting Orientation: : Oriented to Place, Oriented to Self, Oriented to  Time, Oriented to Situation Alcohol / Substance Use: Not Applicable Psych Involvement: No (comment)  Admission diagnosis:  SOB (shortness of breath) [R06.02] Acute respiratory failure with hypoxia (HCC) [J96.01] Acute on chronic diastolic CHF (congestive heart failure) (HCC) [I50.33] Acute hypoxemic respiratory failure (HCC) [J96.01] Patient Active Problem List   Diagnosis Date Noted   Dyslipidemia 02/26/2022   Type 2 diabetes mellitus with peripheral neuropathy ([place]) 02/26/2022   COPD without exacerbation ([place]) 02/26/2022   Acute on chronic respiratory failure with hypoxia ([place]) 02/26/2022   Acute hypoxemic respiratory failure ([place]) 02/26/2022   Acute on chronic diastolic CHF (congestive heart failure) ([place]) 02/25/2022   Streptococcal pneumonia ([place])    Streptococcal bacteremia    CAP (community acquired pneumonia) 11/29/2021   Sepsis ([place]) 11/29/2021   Acute  bacterial bronchitis 11/08/2021   GI bleed 11/08/2021   Elevated troponin level not due myocardial infarction 11/07/2021   Acute cardiogenic pulmonary edema ([place]) 11/07/2021   Chronic respiratory failure with  hypoxia ([place]) 11/07/2021   Mixed diabetic hyperlipidemia associated with type 2 diabetes mellitus ([place]) 11/07/2021   Melena    Anemia due to chronic blood loss    AVM (arteriovenous malformation) of small bowel, acquired with hemorrhage    Acute on chronic blood loss anemia 09/21/2021   Thrombocytosis 09/21/2021   Type 2 diabetes mellitus without complication, without long-term current use of insulin ([place]) 09/21/2021   Class 3 obesity ([place]) 08/27/2021   Acute upper gastrointestinal bleeding 05/09/2021   COPD (chronic obstructive pulmonary disease) ([place])    Coronary artery disease    Osteoarthritis of left knee 04/18/2020   Tobacco use disorder 09/18/2018   Iron deficiency anemia 04/09/2018   Chronic anemia 12/26/2015   PAD (peripheral artery disease) ([place]) 10/05/2013   Discomfort in chest 05/11/2012   Nicotine dependence 05/11/2012   Bradycardia 05/11/2012   Essential hypertension 05/11/2012   Back pain 05/11/2012   PCP:  Benito Mccreedy, MD Pharmacy:   [facility] [place], [place] - [address] AT [facility] [address] [place] [address] Phone: [phone] Fax: [phone]     Social Determinants of Health (SDOH) Interventions    Readmission Risk Interventions    08/28/2021   10:31 AM  Readmission Risk Prevention Plan  Transportation Screening Complete  PCP or Specialist Appt within 3-5 Days Complete  HRI or [place] Complete  Social Work Consult for [place] Planning/Counseling Complete  Palliative Care Screening Complete  Medication Review Press photographer) Complete

## 2022-02-26 NOTE — Progress Notes (Signed)
Mobility Specialist - Progress Note   Pre-mobility: 59 bpm HR, 96% SpO2 During mobility: 78 bpm HR, 85% SpO2 Post-mobility: 62 bpm HR, 93% SPO2  02/26/22 1124  Oxygen Therapy  O2 Device Nasal Cannula  O2 Flow Rate (L/min) 3 L/min  Patient Activity (if Appropriate) Ambulating  Mobility  Activity Ambulated with assistance in hallway  Level of Assistance Standby assist, set-up cues, supervision of patient - no hands on  Assistive Device None  Distance Ambulated (ft) 150 ft  Activity Response Tolerated well  $Mobility charge 1 Mobility   Pt was found in recliner chair and agreeable to mobilize. During the half way mark Pt stated feeling fatigued and towards the EOS SpO2 had dropped as low as 79%. Upon returning to recliner chair practiced pursed lipped breathing with Pt and she was able to bring up her SpO2 and maintain it at or higher than 90%.   Ferd Hibbs Mobility Specialist

## 2022-02-26 NOTE — Assessment & Plan Note (Signed)
-   We will continue her antihypertensives. 

## 2022-02-26 NOTE — Assessment & Plan Note (Signed)
-   This is clearly secondary to #1. - O2 protocol will be followed. - Management otherwise as above.

## 2022-02-26 NOTE — Assessment & Plan Note (Signed)
-   The patient will be admitted to a telemetry bed. - We will continue diuresis with IV Lasix. -Her most recent 2D echo on 12/03/2021 revealed an EF of 55 to 60% with thickened anterior mitral valve leaflets. - We will follow I's and O's and daily weights. - We will follow serial troponins.

## 2022-02-26 NOTE — Progress Notes (Signed)
SATURATION QUALIFICATIONS: (This note is used to comply with regulatory documentation for home oxygen)  Patient Saturations on Room Air at Rest = na%  Patient Saturations on Room Air while Ambulating = na%  Patient Saturations on 3 Liters of oxygen while Ambulating =88%   Please briefly explain why patient needs home oxygen: patient on 3 liters at baseline, on 4 liters at rest 92% prior to ambulation.  Ambulated on 3 liters, dropped to 88% but able to rebound quickly once back at rest.

## 2022-02-26 NOTE — Assessment & Plan Note (Addendum)
-   We will continue statin therapy as well as Zetia.

## 2022-02-26 NOTE — Progress Notes (Signed)
[facility] Progress Note  Patient: [name]     KGM:[id number]  DOA: 02/25/2022   PCP: Benito Mccreedy, MD       Brief hospital course: This is a 71 year old female with chronic diastolic heart failure, essential hypertension, type 2 diabetes mellitus, iron deficiency anemia, COPD with chronic respiratory failure on 3 L of oxygen at baseline. The presents for shortness of breath.  She states that she has been drinking a lot of water and eating a lot of ice lately.  Her Lasix was increased from 40 to 80 mg but despite this her legs stay swollen and her shortness of breath has increased.  She used to be about 204 pounds but over the past few months this has increased to 224.  At one point she was gaining 3 pounds a day.  Was last admitted in June for sepsis related to strep pneumo bacteremia and CAP.  Admitted for acute pulmonary edema and started on IV lasix.  . Subjective:  Feels shortness of breath has improved since yesterday but still feels quite short of breath when she ambulates.  Ankle edema has resolved. Assessment and Plan: Principal Problem:   Acute on chronic diastolic CHF (congestive heart failure) with Essential HTN Right heart failure, chronic   Acute on chronic respiratory failure with hypoxia  -Continues to have extensive bibasilar crackles - Remains hypoxic with pulse ox dropping to the 70s when ambulating - Prior echoes have shown severe pulmonary hypertension, grade 2 diastolic heart failure and poor RV function -Have advised her to reduce her oral intake of ice and fluids if possible - cont IV Lasix , Aldactone, Cozaar  - Norvasc on hold    Active Problems:   History of iron deficiency anemia  - with increased ice intake -She states she takes 1 iron tablet a day - Iron levels checked-ferritin is 12 and iron saturation is 6- will order IV iron - I recommend increasing her oral Iron upon dc - may nee hematology consult to monitor iron  levels as outpt  Hypokalemia, hypomagnesemia - Continue to replace aggressively and follow  Short run of SVT today - She had a short run of SVT today- cont telemetry - hold off an starting a Beta blocker today and diurese for now     PAD (peripheral artery disease) (HCC) - Pletal      Type 2 diabetes mellitus  - holding Metformin - cont SSI    COPD without exacerbation   - chronically on 3 L O2  Morbid Obesity Body mass index is 44.43 kg/m.      DVT prophylaxis:  enoxaparin (LOVENOX) injection 40 mg Start: 02/26/22 1000   Code Status: Full Code  Consultants: none Level of Care: Level of care: Telemetry Disposition Plan:  Status is: Inpatient Remains inpatient appropriate because: IV diuretics  Objective:   Vitals:   02/26/22 0832 02/26/22 0909 02/26/22 1132 02/26/22 1445  BP:  137/65 131/65   Pulse:  91 (!) 57   Resp:  (!) 22 20   Temp:  98.1 F (36.7 C) 97.7 F (36.5 C)   TempSrc:  Oral Oral   SpO2: 95% 92% 97% 96%  Weight:      Height:       Filed Weights   02/25/22 1956 02/26/22 0500  Weight: 101.6 kg 103.2 kg   Exam: General exam: Appears comfortable  HEENT: oral mucosa moist Respiratory system: b/l crackles up to mid lung fields Cardiovascular system: S1 & S2 heard  Gastrointestinal system: Abdomen soft, non-tender, nondistended. Normal bowel sounds   Extremities: No cyanosis, clubbing or edema Psychiatry:  Mood & affect appropriate.    Imaging and lab data was personally reviewed    CBC: Recent Labs  Lab 02/25/22 2145 02/26/22 0402  WBC 7.2 7.6  NEUTROABS 5.3  --   HGB 8.8* 9.1*  HCT 32.2* 33.2*  MCV 81.9 81.2  PLT 476* 056*   Basic Metabolic Panel: Recent Labs  Lab 02/25/22 2145 02/26/22 0402  NA 145 147*  K 3.3* 2.9*  CL 110 107  CO2 27 32  GLUCOSE 108* 105*  BUN 12 11  CREATININE 0.86 0.74  CALCIUM 8.6* 8.8*  MG  --  1.3*   GFR: Estimated Creatinine Clearance: 69.9 mL/min (by C-G formula based on SCr of 0.74  mg/dL).  Scheduled Meds:  aspirin EC  81 mg Oral Daily   atorvastatin  80 mg Oral Daily   cholecalciferol  5,000 Units Oral BID   enoxaparin (LOVENOX) injection  40 mg Subcutaneous Q24H   ezetimibe  10 mg Oral Daily   ferrous sulfate  325 mg Oral Daily   furosemide  40 mg Intravenous BID   gabapentin  600 mg Oral QHS   insulin aspart  0-15 Units Subcutaneous TID WC   insulin aspart  0-5 Units Subcutaneous QHS   ipratropium-albuterol  3 mL Nebulization TID   losartan  25 mg Oral Daily   metoprolol tartrate  25 mg Oral BID   mirabegron ER  25 mg Oral Daily   pantoprazole  40 mg Oral Daily   potassium chloride  40 mEq Oral Q4H   spironolactone  50 mg Oral Daily   Continuous Infusions:  magnesium sulfate bolus IVPB       LOS: 1 day   Author: Debbe Odea  02/26/2022 3:09 PM

## 2022-02-26 NOTE — Assessment & Plan Note (Addendum)
-   We will place the patient on supplemental NovoLog. - We will continue Neurontin.

## 2022-02-27 DIAGNOSIS — I5033 Acute on chronic diastolic (congestive) heart failure: Secondary | ICD-10-CM | POA: Diagnosis not present

## 2022-02-27 LAB — BASIC METABOLIC PANEL
Anion gap: 6 (ref 5–15)
BUN: 12 mg/dL (ref 8–23)
CO2: 33 mmol/L — ABNORMAL HIGH (ref 22–32)
Calcium: 8.6 mg/dL — ABNORMAL LOW (ref 8.9–10.3)
Chloride: 102 mmol/L (ref 98–111)
Creatinine, Ser: 0.71 mg/dL (ref 0.44–1.00)
GFR, Estimated: >60 mL/min (ref >60)
Glucose, Bld: 103 mg/dL — ABNORMAL HIGH (ref 70–99)
Potassium: 3.7 mmol/L (ref 3.5–5.1)
Sodium: 141 mmol/L (ref 135–145)

## 2022-02-27 LAB — GLUCOSE, CAPILLARY
Glucose-Capillary: 127 mg/dL — ABNORMAL HIGH (ref 70–99)
Glucose-Capillary: 81 mg/dL (ref 70–99)
Glucose-Capillary: 139 mg/dL — ABNORMAL HIGH (ref 70–99)
Glucose-Capillary: 131 mg/dL — ABNORMAL HIGH (ref 70–99)

## 2022-02-27 LAB — MAGNESIUM: Magnesium: 1.7 mg/dL (ref 1.7–2.4)

## 2022-02-27 MED ORDER — PNEUMOCOCCAL 20-VAL CONJ VACC 0.5 ML IM SUSY
0.5000 mL | PREFILLED_SYRINGE | INTRAMUSCULAR | Status: AC
  Administered 2022-02-28: 0.5 mL via INTRAMUSCULAR
  Filled 2022-02-27: qty 0.5

## 2022-02-27 NOTE — Progress Notes (Signed)
PROGRESS NOTE  [name]  [id number]:[id number] DOB: Dec 13, 1950 DOA: 02/25/2022 PCP: Benito Mccreedy, MD   Brief Narrative:  Patient is a 71 year old female with history of chronic diastolic CHF, hypertension, diabetes type 2, iron deficiency anemia, COPD on chronic 3L of oxygen at baseline who presented with shortness of breath.  She thought  she drank a lot of water and ice.  Her Lasix was recently increased from 40 to 80  mg but despite that her legs remain swollen, she remained dyspneic.  She gained about 20 pounds.  On presentation, she  was mildly  hypertensive.  Lab work showed elevated BNP, low K.  Chest x-ray showed progression and  diffuse peribronchial thickening likely from pulm edema .  Patient was admitted for the management of acute on chronic diastolic CHF exacerbation.  Started on IV Lasix.  Assessment & Plan:  Principal Problem:   Acute on chronic diastolic CHF (congestive heart failure) (HCC) Active Problems:   Acute on chronic respiratory failure with hypoxia (HCC)   Coronary artery disease   Essential hypertension   PAD (peripheral artery disease) (HCC)   Iron deficiency anemia   Dyslipidemia   Type 2 diabetes mellitus with peripheral neuropathy (HCC)   COPD without exacerbation (HCC)   Acute hypoxemic respiratory failure (HCC)   Acute on chronic diastolic CHF: Presented with dyspnea, weight gain, bilateral lower extremity edema.  Started on IV Lasix.  She is having good diuresis.  Continue to monitor input/output, daily weight.  Also on Aldactone, Cozaar.  Acute on chronic hypoxic respiratory failure: Usually on 3 L of oxygen at baseline, requiring 4 L on presentation.  She was desaturating on ambulation.  We will try to wean to her baseline.  She had crackles bilaterally on presentation.  Chest imaging showed features of pulmonary edema.  History of iron deficiency anemia: Continue supplementation.  Currently hemoglobin stable.  Iron level checked, low  saturation,low normal iron, given IV iron here.  Hypomagnesemia/hypomagnesemia: Continue to monitor and supplement.  Short run of  SVT: currently in normal sinus rhythm.  We will continue her home beta-blocker  Peripheral vascular disease: On Pletal  Type 2 diabetes: On metformin at home.  Continue sliding-scale insulin  History of COPD: Currently without exacerbation.  On oxygen at home  Morbid obesity: BMI of 44.4         DVT prophylaxis:enoxaparin (LOVENOX) injection 40 mg Start: 02/26/22 1000     Code Status: Full Code  Family Communication: None at bedside  Patient status:Inpatient  Patient is from :Home  Anticipated discharge HG:DJME  Estimated DC date:1-2 days   Consultants: None  Procedures:None  Antimicrobials:  Anti-infectives (From admission, onward)    None       Subjective: Patient seen and examined at the bedside this morning.  Hemodynamically stable.  Lying on chair.  On 3 L oxygen today.  Denies any shortness of breath or cough but complains of persistent swelling of the thigh and legs.  Objective: Vitals:   02/27/22 0437 02/27/22 0450 02/27/22 0844 02/27/22 1114  BP: 111/60     Pulse: (!) 55   67  Resp: 18     Temp: 98.7 F (37.1 C)     TempSrc: Oral     SpO2: 92%  93%   Weight:  103.4 kg    Height:        Intake/Output Summary (Last 24 hours) at 02/27/2022 1135 Last data filed at 02/27/2022 1125 Gross per 24 hour  Intake 1590.18 ml  Output 3100 ml  Net -1509.82 ml   Filed Weights   02/25/22 1956 02/26/22 0500 02/27/22 0450  Weight: 101.6 kg 103.2 kg 103.4 kg    Examination:  General exam: Overall comfortable, not in distress, obese HEENT: PERRL Respiratory system: Mild bibasilar crackles Cardiovascular system: S1 & S2 heard, RRR.  Gastrointestinal system: Abdomen is nondistended, soft and nontender. Central nervous system: Alert and oriented Extremities: 1-2+ bilateral lower extremity pitting edema, no clubbing ,no  cyanosis Skin: No rashes, no ulcers,no icterus     Data Reviewed: I have personally reviewed following labs and imaging studies  CBC: Recent Labs  Lab 02/25/22 2145 02/26/22 0402  WBC 7.2 7.6  NEUTROABS 5.3  --   HGB 8.8* 9.1*  HCT 32.2* 33.2*  MCV 81.9 81.2  PLT 476* 662*   Basic Metabolic Panel: Recent Labs  Lab 02/25/22 2145 02/26/22 0402 02/27/22 0427  NA 145 147* 141  K 3.3* 2.9* 3.7  CL 110 107 102  CO2 27 32 33*  GLUCOSE 108* 105* 103*  BUN 12 11 12   CREATININE 0.86 0.74 0.71  CALCIUM 8.6* 8.8* 8.6*  MG  --  1.3* 1.7     Recent Results (from the past 240 hour(s))  SARS Coronavirus 2 by RT PCR (hospital order, performed in [facility] hospital lab) *cepheid single result test* Anterior Nasal Swab     Status: None   Collection Time: 02/25/22  2:02 AM   Specimen: Anterior Nasal Swab  Result Value Ref Range Status   SARS Coronavirus 2 by RT PCR NEGATIVE NEGATIVE Final    Comment: (NOTE) SARS-CoV-2 target nucleic acids are NOT DETECTED.  The SARS-CoV-2 RNA is generally detectable in upper and lower respiratory specimens during the acute phase of infection. The lowest concentration of SARS-CoV-2 viral copies this assay can detect is 250 copies / mL. A negative result does not preclude SARS-CoV-2 infection and should not be used as the sole basis for treatment or other patient management decisions.  A negative result may occur with improper specimen collection / handling, submission of specimen other than nasopharyngeal swab, presence of viral mutation(s) within the areas targeted by this assay, and inadequate number of viral copies (<250 copies / mL). A negative result must be combined with clinical observations, patient history, and epidemiological information.  Fact Sheet for Patients:   https://www.patel.info/  Fact Sheet for Healthcare Providers: https://hall.com/  This test is not yet approved or  cleared  by the [place] FDA and has been authorized for detection and/or diagnosis of SARS-CoV-2 by FDA under an Emergency Use Authorization (EUA).  This EUA will remain in effect (meaning this test can be used) for the duration of the COVID-19 declaration under Section 564(b)(1) of the Act, 21 U.S.C. section 360bbb-3(b)(1), unless the authorization is terminated or revoked sooner.  Performed at [facility], [place] [address], [place], [place] [address]      Radiology Studies: [facility] Chest Port 1 View  Result Date: 02/25/2022 CLINICAL DATA:  Shortness of breath.  History of COPD. EXAM: PORTABLE CHEST 1 VIEW COMPARISON:  Radiograph 11/29/2018. FINDINGS: Cardiomegaly, mild increase. Stable mediastinal contours. Progression in diffuse peribronchial thickening from prior. No large pleural effusion. Mild scarring at the left lung base. No pneumothorax or confluent consolidation. IMPRESSION: 1. Progression in diffuse peribronchial thickening from prior exam, pulmonary edema versus acute bronchitis/COPD exacerbation. 2. Slight increase in cardiomegaly. Electronically Signed   By: Keith Rake M.D.   On: 02/25/2022 21:54    Scheduled  Meds:  aspirin EC  81 mg Oral Daily   atorvastatin  80 mg Oral Daily   cholecalciferol  5,000 Units Oral BID   cilostazol  50 mg Oral BID   enoxaparin (LOVENOX) injection  40 mg Subcutaneous Q24H   ezetimibe  10 mg Oral Daily   ferrous sulfate  325 mg Oral Daily   furosemide  40 mg Intravenous BID   gabapentin  600 mg Oral QHS   insulin aspart  0-15 Units Subcutaneous TID WC   insulin aspart  0-5 Units Subcutaneous QHS   ipratropium-albuterol  3 mL Nebulization TID   losartan  25 mg Oral Daily   metoprolol tartrate  25 mg Oral BID   mirabegron ER  25 mg Oral Daily   pantoprazole  40 mg Oral Daily   [START ON 02/28/2022] pneumococcal 20-valent conjugate vaccine  0.5 mL Intramuscular Tomorrow-1000   spironolactone  50 mg Oral Daily   Continuous  Infusions:   LOS: 2 days   Shelly Coss, MD [facility] P9/11/2021, 11:35 AM

## 2022-02-27 NOTE — Progress Notes (Signed)
NT found pt asleep without [place] on, SatO2 in 60's%. [place] at 3L replaced and SatO2 in 88%. RN increased to 4L at 92%. Pt did not remember taking off [place], and RN observed it was on during previous rounding a couple hours ago.

## 2022-02-27 NOTE — Progress Notes (Signed)
Mobility Specialist - Progress Note     02/27/22 1039  Mobility  HOB Elevated/Bed Position Self regulated  Activity Transferred to/from [facility]  Range of Motion/Exercises Active  Level of Assistance Contact guard assist, steadying assist  Assistive Device BSC  Distance Ambulated (ft) 5 ft  Activity Response Tolerated well  $Mobility charge 1 Mobility   Pt stated having to use BSC upon arriving in room. Assisted in transfer and upon transferring back to bed left with all necessities in reach.  Ferd Hibbs Mobility Specialist

## 2022-02-27 NOTE — Progress Notes (Signed)
Patient reports "not huffing and puffing" since receiving dose of Lasix this morning.  She has been ambulating independently in room and to bathroom w/out c/o of dyspnea.  Has diuresed 1000 cc since receiving IV lasix this morning.  Angie Fava, RN

## 2022-02-27 NOTE — Progress Notes (Signed)
Mobility Specialist - Progress Note  Pre-mobility: 62 bpm HR, 91% SpO2 During mobility: 84 bpm HR, 77% SpO2 Post-mobility: 65 bpm HR, 90% SPO2  02/27/22 1512  Oxygen Therapy  O2 Device Nasal Cannula  O2 Flow Rate (L/min) 3 L/min  Mobility  HOB Elevated/Bed Position Self regulated  Activity Ambulated with assistance in hallway  Range of Motion/Exercises Active  Level of Assistance Modified independent, requires aide device or extra time  Assistive Device None  Distance Ambulated (ft) 190 ft  Activity Response Tolerated well  $Mobility charge 1 Mobility   Pt was found in recliner chair and agreeable to mobilize. Had no complaints and stated she did not feel SOB. SpO2 did not drop down lower than 77% and she was able to bring it back up to 90% in ~2 min. Was left in recliner chair with all necessities in reach and RN in room.  Ferd Hibbs Mobility Specialist

## 2022-02-28 DIAGNOSIS — I5033 Acute on chronic diastolic (congestive) heart failure: Secondary | ICD-10-CM | POA: Diagnosis not present

## 2022-02-28 LAB — BASIC METABOLIC PANEL
Anion gap: 8 (ref 5–15)
BUN: 15 mg/dL (ref 8–23)
CO2: 34 mmol/L — ABNORMAL HIGH (ref 22–32)
Calcium: 8.8 mg/dL — ABNORMAL LOW (ref 8.9–10.3)
Chloride: 100 mmol/L (ref 98–111)
Creatinine, Ser: 0.78 mg/dL (ref 0.44–1.00)
GFR, Estimated: >60 mL/min (ref >60)
Glucose, Bld: 106 mg/dL — ABNORMAL HIGH (ref 70–99)
Potassium: 3.3 mmol/L — ABNORMAL LOW (ref 3.5–5.1)
Sodium: 142 mmol/L (ref 135–145)

## 2022-02-28 LAB — GLUCOSE, CAPILLARY
Glucose-Capillary: 107 mg/dL — ABNORMAL HIGH (ref 70–99)
Glucose-Capillary: 91 mg/dL (ref 70–99)
Glucose-Capillary: 87 mg/dL (ref 70–99)
Glucose-Capillary: 123 mg/dL — ABNORMAL HIGH (ref 70–99)

## 2022-02-28 MED ORDER — POTASSIUM CHLORIDE CRYS ER 20 MEQ PO TBCR
40.0000 meq | EXTENDED_RELEASE_TABLET | Freq: Every day | ORAL | Status: DC
  Administered 2022-02-28 – 2022-03-01 (×2): 40 meq via ORAL
  Filled 2022-02-28 (×2): qty 2

## 2022-02-28 NOTE — Progress Notes (Signed)
Mobility Specialist - Progress Note   02/28/22 1333  Mobility  HOB Elevated/Bed Position Self regulated  Activity Ambulated independently in hallway  Range of Motion/Exercises Active  Level of Assistance Independent  Assistive Device None  Distance Ambulated (ft) 160 ft  Activity Response Tolerated well  Transport method Ambulatory  $Mobility charge 1 Mobility   Pt received in recliner and agreeable to mobility. Pt SOB at EOS.  Pt to recliner after session with all needs met.    [facility]

## 2022-02-28 NOTE — Progress Notes (Signed)
PROGRESS NOTE  [name]  [id number]:[id number] DOB: 1950/12/08 DOA: 02/25/2022 PCP: Benito Mccreedy, MD   Brief Narrative:  Patient is a 71 year old female with history of chronic diastolic CHF, hypertension, diabetes type 2, iron deficiency anemia, COPD on chronic 3L of oxygen at baseline who presented with shortness of breath.  She thought  she drank a lot of water and ice.  Her Lasix was recently increased from 40 to 80  mg but despite that her legs remain swollen, she remained dyspneic.  She gained about 20 pounds.  On presentation, she  was mildly  hypertensive.  Lab work showed elevated BNP, low K.  Chest x-ray showed progression and  diffuse peribronchial thickening likely from pulm edema .  Patient was admitted for the management of acute on chronic diastolic CHF exacerbation.  Started on IV Lasix.  Assessment & Plan:  Principal Problem:   Acute on chronic diastolic CHF (congestive heart failure) (HCC) Active Problems:   Acute on chronic respiratory failure with hypoxia (HCC)   Coronary artery disease   Essential hypertension   PAD (peripheral artery disease) (HCC)   Iron deficiency anemia   Dyslipidemia   Type 2 diabetes mellitus with peripheral neuropathy (HCC)   COPD without exacerbation (HCC)   Acute hypoxemic respiratory failure (HCC)   Acute on chronic diastolic CHF: Presented with dyspnea, weight gain, bilateral lower extremity edema.  Started on IV Lasix.  She is having good diuresis.  Continue to monitor input/output, daily weight.  Also on Aldactone, Cozaar.  Continue IV Lasix for today  Acute on chronic hypoxic respiratory failure: Usually on 3 L of oxygen at baseline, requiring 4 L on presentation.  She was desaturating on ambulation.  Now at baseline.  She had crackles bilaterally on presentation.  Chest imaging showed features of pulmonary edema.  History of iron deficiency anemia: Continue supplementation.  Currently hemoglobin stable.  Iron level checked,  low saturation,low normal iron, given IV iron here.  Hypomagnesemia/hypomagnesemia: Continue to monitor and supplement.  Short run of  SVT: currently in normal sinus rhythm.  We will continue her home beta-blocker  Peripheral vascular disease: On Pletal  Type 2 diabetes: On metformin at home.  Continue sliding-scale insulin  History of COPD: Currently without exacerbation.  On oxygen at home  Morbid obesity: BMI of 44.4         DVT prophylaxis:enoxaparin (LOVENOX) injection 40 mg Start: 02/26/22 1000     Code Status: Full Code  Family Communication: None at bedside  Patient status:Inpatient  Patient is from :Home  Anticipated discharge KG:YJEH  Estimated DC date:1-2 days   Consultants: None  Procedures:None  Antimicrobials:  Anti-infectives (From admission, onward)    None       Subjective: Patient seen and examined at the bedside today.  She feels comfortable.  On 3 days of oxygen.  Denies any worsening shortness of breath or cough.  Lower extremity edema improving but he still has trace edema so we will continue Lasix today  Objective: Vitals:   02/28/22 0526 02/28/22 0810 02/28/22 0833 02/28/22 1043  BP:   124/64 137/64  Pulse:   65 72  Resp:   18 20  Temp:   98.6 F (37 C) 98.4 F (36.9 C)  TempSrc:   Oral Oral  SpO2:  100% 99% 95%  Weight: 100.6 kg     Height:        Intake/Output Summary (Last 24 hours) at 02/28/2022 1137 Last data filed at 02/27/2022 1846 Gross per  24 hour  Intake 540 ml  Output 1200 ml  Net -660 ml   Filed Weights   02/26/22 0500 02/27/22 0450 02/28/22 0526  Weight: 103.2 kg 103.4 kg 100.6 kg    Examination:  General exam: Overall comfortable, not in distress, morbidly obese HEENT: PERRL Respiratory system:  no wheezes or crackles  Cardiovascular system: S1 & S2 heard, RRR.  Gastrointestinal system: Abdomen is nondistended, soft and nontender. Central nervous system: Alert and oriented Extremities: Trace  bilateral lower extremity pitting edema, no clubbing ,no cyanosis Skin: No rashes, no ulcers,no icterus     Data Reviewed: I have personally reviewed following labs and imaging studies  CBC: Recent Labs  Lab 02/25/22 2145 02/26/22 0402  WBC 7.2 7.6  NEUTROABS 5.3  --   HGB 8.8* 9.1*  HCT 32.2* 33.2*  MCV 81.9 81.2  PLT 476* 096*   Basic Metabolic Panel: Recent Labs  Lab 02/25/22 2145 02/26/22 0402 02/27/22 0427 02/28/22 0452  NA 145 147* 141 142  K 3.3* 2.9* 3.7 3.3*  CL 110 107 102 100  CO2 27 32 33* 34*  GLUCOSE 108* 105* 103* 106*  BUN 12 11 12 15   CREATININE 0.86 0.74 0.71 0.78  CALCIUM 8.6* 8.8* 8.6* 8.8*  MG  --  1.3* 1.7  --      Recent Results (from the past 240 hour(s))  SARS Coronavirus 2 by RT PCR (hospital order, performed in [facility] hospital lab) *cepheid single result test* Anterior Nasal Swab     Status: None   Collection Time: 02/25/22  2:02 AM   Specimen: Anterior Nasal Swab  Result Value Ref Range Status   SARS Coronavirus 2 by RT PCR NEGATIVE NEGATIVE Final    Comment: (NOTE) SARS-CoV-2 target nucleic acids are NOT DETECTED.  The SARS-CoV-2 RNA is generally detectable in upper and lower respiratory specimens during the acute phase of infection. The lowest concentration of SARS-CoV-2 viral copies this assay can detect is 250 copies / mL. A negative result does not preclude SARS-CoV-2 infection and should not be used as the sole basis for treatment or other patient management decisions.  A negative result may occur with improper specimen collection / handling, submission of specimen other than nasopharyngeal swab, presence of viral mutation(s) within the areas targeted by this assay, and inadequate number of viral copies (<250 copies / mL). A negative result must be combined with clinical observations, patient history, and epidemiological information.  Fact Sheet for Patients:   https://www.patel.info/  Fact Sheet for  Healthcare Providers: https://hall.com/  This test is not yet approved or  cleared by the [place] FDA and has been authorized for detection and/or diagnosis of SARS-CoV-2 by FDA under an Emergency Use Authorization (EUA).  This EUA will remain in effect (meaning this test can be used) for the duration of the COVID-19 declaration under Section 564(b)(1) of the Act, 21 U.S.C. section 360bbb-3(b)(1), unless the authorization is terminated or revoked sooner.  Performed at [facility], [place] [address], [place], [place] [address]      Radiology Studies: No results found.  Scheduled Meds:  aspirin EC  81 mg Oral Daily   atorvastatin  80 mg Oral Daily   cholecalciferol  5,000 Units Oral BID   cilostazol  50 mg Oral BID   enoxaparin (LOVENOX) injection  40 mg Subcutaneous Q24H   ezetimibe  10 mg Oral Daily   ferrous sulfate  325 mg Oral Daily   furosemide  40 mg Intravenous BID  gabapentin  600 mg Oral QHS   insulin aspart  0-15 Units Subcutaneous TID WC   insulin aspart  0-5 Units Subcutaneous QHS   ipratropium-albuterol  3 mL Nebulization TID   losartan  25 mg Oral Daily   metoprolol tartrate  25 mg Oral BID   mirabegron ER  25 mg Oral Daily   pantoprazole  40 mg Oral Daily   potassium chloride  40 mEq Oral Daily   spironolactone  50 mg Oral Daily   Continuous Infusions:   LOS: 3 days   Shelly Coss, MD [facility] P9/12/2021, 11:37 AM

## 2022-02-28 NOTE — Progress Notes (Signed)
Mobility Specialist Cancellation/Refusal Note:   Reason for Cancellation/Refusal: Pt declined mobility at this time. Pt sleeping peacefully, upon wakeup mentioned wanting to be seen last today. Will check back as schedule permits.  ]    Danelle Berry Mobility Specialist

## 2022-03-01 DIAGNOSIS — I5033 Acute on chronic diastolic (congestive) heart failure: Secondary | ICD-10-CM | POA: Diagnosis not present

## 2022-03-01 LAB — BASIC METABOLIC PANEL
Anion gap: 6 (ref 5–15)
BUN: 17 mg/dL (ref 8–23)
CO2: 37 mmol/L — ABNORMAL HIGH (ref 22–32)
Calcium: 9.1 mg/dL (ref 8.9–10.3)
Chloride: 99 mmol/L (ref 98–111)
Creatinine, Ser: 1 mg/dL (ref 0.44–1.00)
GFR, Estimated: >60 mL/min (ref >60)
Glucose, Bld: 112 mg/dL — ABNORMAL HIGH (ref 70–99)
Potassium: 4 mmol/L (ref 3.5–5.1)
Sodium: 142 mmol/L (ref 135–145)

## 2022-03-01 LAB — GLUCOSE, CAPILLARY
Glucose-Capillary: 123 mg/dL — ABNORMAL HIGH (ref 70–99)
Glucose-Capillary: 131 mg/dL — ABNORMAL HIGH (ref 70–99)

## 2022-03-01 MED ORDER — POTASSIUM CHLORIDE CRYS ER 20 MEQ PO TBCR: 20.0000 meq | EXTENDED_RELEASE_TABLET | Freq: Every day | ORAL | 0 refills | Status: DC

## 2022-03-01 MED ORDER — IPRATROPIUM-ALBUTEROL 0.5-2.5 (3) MG/3ML IN SOLN: 3.0000 mL | Freq: Two times a day (BID) | RESPIRATORY_TRACT | Status: DC

## 2022-03-01 MED ORDER — FUROSEMIDE 40 MG PO TABS
40.0000 mg | ORAL_TABLET | Freq: Two times a day (BID) | ORAL | 0 refills | Status: DC
Start: 2022-03-01 — End: 2023-01-31

## 2022-03-01 NOTE — Progress Notes (Signed)
AVS given to patient and explained at the bedside. Medications and follow up appointments have been explained with pt verbalizing understanding.  

## 2022-03-01 NOTE — Consult Note (Addendum)
   [facility] CM Inpatient Consult   03/01/2022  [name] 1951-02-10 [id number]  [place] [facility] [ACO] Patient: [employer] Medicare  Primary Care Provider:  Benito Mccreedy, MD, [facility] Liaison coverage for [facility]    Patient is currently active with [facility] for chronic disease management services.  Patient has been engaged by a [facility] and .  Our community based plan of care has focused on disease management and community resource support.    Patient will receive a post hospital call and will be evaluated for assessments and disease process education. Patient transitioned home with [facility] and DME noted per inpatient TOC RNCM notes and [facility] LCSW for care coordination  Plan: Addendum: corrected - Will follow up with [facility] LCSW noted as patient has transitioned, today.  Of note, [facility] Care Management services does not replace or interfere with any services that are needed or arranged by inpatient [facility] care management team.  For additional questions or referrals please contact:  Natividad Brood, RN BSN [facility] Liaison  [phone] business mobile phone Toll free office [phone]  Fax number: [phone] [email or web address]

## 2022-03-01 NOTE — Progress Notes (Signed)
Mobility Specialist - Progress Note   Pre-mobility: 65 bpm HR, 90% SpO2 During mobility: 104 bpm HR, 82% SpO2 Post-mobility: 74 bpm HR, 92% SPO2   03/01/22 0941  Oxygen Therapy  O2 Device Nasal Cannula  O2 Flow Rate (L/min) 3 L/min  Mobility  HOB Elevated/Bed Position Self regulated  Activity Ambulated with assistance in hallway  Range of Motion/Exercises Active  Level of Assistance Modified independent, requires aide device or extra time  Assistive Device Front wheel walker  Distance Ambulated (ft) 150 ft  Activity Response Tolerated well  $Mobility charge 1 Mobility   Pt was found in bed and agreeable to mobilize. Pt was assisted to bathroom before ambulating and only used a RW due to having L. Hip pain. Was left at EOB with all necessities in reach.  Ferd Hibbs Mobility Specialist

## 2022-03-01 NOTE — Care Management Important Message (Signed)
Important Message  Patient Details IM Letter given to the Patient. Name: [name] MRN: [id number] Date of Birth: [birth year]   Medicare Important Message Given:  Yes     Kerin Salen 03/01/2022, 9:51 AM

## 2022-03-01 NOTE — Discharge Summary (Signed)
Physician Discharge Summary  [name] FAO:[id number] DOB: August 26, 1950 DOA: 02/25/2022  PCP: Benito Mccreedy, MD  Admit date: 02/25/2022 Discharge date: 03/01/2022  Admitted From: Home Disposition:  Home  Discharge Condition:Stable CODE STATUS:FULL Diet recommendation: Heart Healthy   Brief/Interim Summary:  Patient is a 71 year old female with history of chronic diastolic CHF, hypertension, diabetes type 2, iron deficiency anemia, COPD on chronic 3L of oxygen at baseline who presented with shortness of breath.  She thought  she drank a lot of water and ice.  Her Lasix was recently increased from 40 to 80  mg but despite that her legs remain swollen, she remained dyspneic.  She gained about 20 pounds.  On presentation, she  was mildly  hypertensive.  Lab work showed elevated BNP, low K.  Chest x-ray showed progression and  diffuse peribronchial thickening likely from pulm edema .  Patient was admitted for the management of acute on chronic diastolic CHF exacerbation.  Started on IV Lasix.  She has been diuresed quite well, her bilateral lower extremity swelling has resolved.  She is almost euvolemic.  She is medically stable for discharge to home today with Lasix 40 mg twice a day.  Following problems were addressed during her hospitalization:  Acute on chronic diastolic CHF: Presented with dyspnea, weight gain, bilateral lower extremity edema.  Started on IV Lasix.  She had good diuresis.  Also on Aldactone, Cozaar.  Continue PO Lasix    Acute on chronic hypoxic respiratory failure: Usually on 3 L of oxygen at baseline, requiring 4 L on presentation.  She was desaturating on ambulation.  Now at baseline.  She had crackles bilaterally on presentation.  Chest imaging showed features of pulmonary edema.  Lungs are clear on auscultation today.   History of iron deficiency anemia: Continue supplementation.  Currently hemoglobin stable.  Iron level checked, low saturation,low normal iron,  given IV iron here.   Hypokalemia: Continue potassium supplementation.   Short run of  SVT: currently in normal sinus rhythm.  We will continue her home beta-blocker   Peripheral vascular disease: On Pletal,aspirin   Type 2 diabetes: On metformin at home.     History of COPD: Currently without exacerbation.  On oxygen at home   Morbid obesity: BMI of 44.4  Discharge Diagnoses:  Principal Problem:   Acute on chronic diastolic CHF (congestive heart failure) (HCC) Active Problems:   Acute on chronic respiratory failure with hypoxia (HCC)   Coronary artery disease   Essential hypertension   PAD (peripheral artery disease) (HCC)   Iron deficiency anemia   Dyslipidemia   Type 2 diabetes mellitus with peripheral neuropathy (HCC)   COPD without exacerbation ([place])   Acute hypoxemic respiratory failure (HCC)    Discharge Instructions  Discharge Instructions     Diet - low sodium heart healthy   Complete by: As directed    Discharge instructions   Complete by: As directed    1)Please take prescribed medications as instructed 2)Limit fluid intake to less than 1.5 L a day, salt intake to less than 2 g a day.  Monitor your weight at home 3)Follow up with your PCP in a week   Increase activity slowly   Complete by: As directed       Allergies as of 03/01/2022   No Known Allergies      Medication List     TAKE these medications    albuterol (2.5 MG/3ML) 0.083% nebulizer solution Commonly known as: PROVENTIL Take 2.5 mg by nebulization  every 6 (six) hours as needed for wheezing or shortness of breath. What changed: Another medication with the same name was changed. Make sure you understand how and when to take each.   albuterol 108 (90 Base) MCG/ACT inhaler Commonly known as: VENTOLIN HFA Inhale 1-2 puffs into the lungs every 6 (six) hours as needed for wheezing or shortness of breath. What changed: how much to take   amLODipine 10 MG tablet Commonly known as:  NORVASC Take 10 mg by mouth daily.   Aspirin Low Dose 81 MG tablet Generic drug: aspirin EC TAKE 1 TABLET(81 MG) BY MOUTH DAILY What changed: See the new instructions.   atorvastatin 80 MG tablet Commonly known as: LIPITOR Take 80 mg by mouth daily.   cilostazol 50 MG tablet Commonly known as: PLETAL Take 50 mg by mouth 2 (two) times daily.   ezetimibe 10 MG tablet Commonly known as: ZETIA TAKE 1 TABLET BY MOUTH DAILY   ferrous sulfate 325 (65 FE) MG tablet Take 325 mg by mouth daily.   fluticasone 50 MCG/ACT nasal spray Commonly known as: FLONASE Place 1 spray into both nostrils daily as needed for allergies.   furosemide 40 MG tablet Commonly known as: Lasix Take 1 tablet (40 mg total) by mouth 2 (two) times daily. What changed: when to take this   gabapentin 600 MG tablet Commonly known as: NEURONTIN Take 1 tablet (600 mg total) by mouth at bedtime.   ipratropium-albuterol 0.5-2.5 (3) MG/3ML Soln Commonly known as: DUONEB Take 3 mLs by nebulization 3 (three) times daily. What changed:  when to take this reasons to take this   Linzess 145 MCG Caps capsule Generic drug: linaclotide Take 145 mcg by mouth daily as needed (Constipation).   losartan 25 MG tablet Commonly known as: Cozaar Take 1 tablet (25 mg total) by mouth daily.   metFORMIN 500 MG tablet Commonly known as: GLUCOPHAGE Take 0.5 tablets (250 mg total) by mouth 2 (two) times daily with a meal.   metoprolol tartrate 25 MG tablet Commonly known as: LOPRESSOR Take 1 tablet (25 mg total) by mouth 2 (two) times daily.   Myrbetriq 25 MG Tb24 tablet Generic drug: mirabegron ER Take 25 mg by mouth daily.   nitroGLYCERIN 0.4 MG SL tablet Commonly known as: NITROSTAT Place 1 tablet (0.4 mg total) under the tongue every 5 (five) minutes as needed for chest pain.   OXYGEN Inhale 3 L into the lungs continuous.   pantoprazole 40 MG tablet Commonly known as: PROTONIX Take 1 tablet (40 mg total) by  mouth 2 (two) times daily. What changed: when to take this   potassium chloride SA 20 MEQ tablet Commonly known as: KLOR-CON M Take 1 tablet (20 mEq total) by mouth daily. Start taking on: March 02, 2022   spironolactone 50 MG tablet Commonly known as: ALDACTONE TAKE 1 TABLET BY MOUTH DAILY   Trelegy Ellipta 100-62.5-25 MCG/ACT Aepb Generic drug: Fluticasone-Umeclidin-Vilant Inhale 1 puff into the lungs daily.   VITAMIN D-3 PO Take 1 capsule by mouth 2 (two) times daily.        Follow-up Information     Osei-Bonsu, Iona Beard, MD. Schedule an appointment as soon as possible for a visit in 1 week(s).   Specialty: Internal Medicine Contact information: [address] [place] [address] [phone]                No Known Allergies  Consultations: None   Procedures/Studies: DG Chest [facility]  Result Date: 02/25/2022 CLINICAL DATA:  Shortness of breath.  History of COPD. EXAM: PORTABLE CHEST 1 VIEW COMPARISON:  Radiograph 11/29/2018. FINDINGS: Cardiomegaly, mild increase. Stable mediastinal contours. Progression in diffuse peribronchial thickening from prior. No large pleural effusion. Mild scarring at the left lung base. No pneumothorax or confluent consolidation. IMPRESSION: 1. Progression in diffuse peribronchial thickening from prior exam, pulmonary edema versus acute bronchitis/COPD exacerbation. 2. Slight increase in cardiomegaly. Electronically Signed   By: Keith Rake M.D.   On: 02/25/2022 21:54      Subjective: Patient seen and examined at the bedside today.  Hemodynamically stable for discharge.  Discharge Exam: Vitals:   03/01/22 0530 03/01/22 0753  BP: 115/68   Pulse: 61   Resp: 20   Temp: 98.3 F (36.8 C)   SpO2: 100% 99%   Vitals:   02/28/22 2136 03/01/22 0525 03/01/22 0530 03/01/22 0753  BP: 139/81  115/68   Pulse: 70  61   Resp: 18  20   Temp: 98.4 F (36.9 C)  98.3 F (36.8 C)   TempSrc: Oral  Oral   SpO2:  95%  100% 99%  Weight:  97.9 kg    Height:        General: Pt is alert, awake, not in acute distress Cardiovascular: RRR, S1/S2 +, no rubs, no gallops Respiratory: CTA bilaterally, no wheezing, no rhonchi Abdominal: Soft, NT, ND, bowel sounds + Extremities: no edema, no cyanosis    The results of significant diagnostics from this hospitalization (including imaging, microbiology, ancillary and laboratory) are listed below for reference.     Microbiology: Recent Results (from the past 240 hour(s))  SARS Coronavirus 2 by RT PCR (hospital order, performed in [facility] hospital lab) *cepheid single result test* Anterior Nasal Swab     Status: None   Collection Time: 02/25/22  2:02 AM   Specimen: Anterior Nasal Swab  Result Value Ref Range Status   SARS Coronavirus 2 by RT PCR NEGATIVE NEGATIVE Final    Comment: (NOTE) SARS-CoV-2 target nucleic acids are NOT DETECTED.  The SARS-CoV-2 RNA is generally detectable in upper and lower respiratory specimens during the acute phase of infection. The lowest concentration of SARS-CoV-2 viral copies this assay can detect is 250 copies / mL. A negative result does not preclude SARS-CoV-2 infection and should not be used as the sole basis for treatment or other patient management decisions.  A negative result may occur with improper specimen collection / handling, submission of specimen other than nasopharyngeal swab, presence of viral mutation(s) within the areas targeted by this assay, and inadequate number of viral copies (<250 copies / mL). A negative result must be combined with clinical observations, patient history, and epidemiological information.  Fact Sheet for Patients:   https://www.patel.info/  Fact Sheet for Healthcare Providers: https://hall.com/  This test is not yet approved or  cleared by the [place] FDA and has been authorized for detection and/or diagnosis of SARS-CoV-2  by FDA under an Emergency Use Authorization (EUA).  This EUA will remain in effect (meaning this test can be used) for the duration of the COVID-19 declaration under Section 564(b)(1) of the Act, 21 U.S.C. section 360bbb-3(b)(1), unless the authorization is terminated or revoked sooner.  Performed at [facility], [place] [address], [place], [place] [address]      Labs: BNP (last 3 results) Recent Labs    11/08/21 0937 11/29/21 0244 02/25/22 2145  BNP 217.8* 232.1* 627.0*   Basic Metabolic Panel: Recent Labs  Lab 02/25/22 2145 02/26/22 0402 02/27/22 0427 02/28/22 0452 03/01/22 0426  NA 145 147* 141 142 142  K 3.3* 2.9* 3.7 3.3* 4.0  CL 110 107 102 100 99  CO2 27 32 33* 34* 37*  GLUCOSE 108* 105* 103* 106* 112*  BUN 12 11 12 15 17   CREATININE 0.86 0.74 0.71 0.78 1.00  CALCIUM 8.6* 8.8* 8.6* 8.8* 9.1  MG  --  1.3* 1.7  --   --    Liver Function Tests: Recent Labs  Lab 02/25/22 2145  AST 14*  ALT 8  ALKPHOS 67  BILITOT 0.7  PROT 7.2  ALBUMIN 3.3*   No results for input(s): "LIPASE", "AMYLASE" in the last 168 hours. No results for input(s): "AMMONIA" in the last 168 hours. CBC: Recent Labs  Lab 02/25/22 2145 02/26/22 0402  WBC 7.2 7.6  NEUTROABS 5.3  --   HGB 8.8* 9.1*  HCT 32.2* 33.2*  MCV 81.9 81.2  PLT 476* 505*   Cardiac Enzymes: No results for input(s): "CKTOTAL", "CKMB", "CKMBINDEX", "TROPONINI" in the last 168 hours. BNP: Invalid input(s): "POCBNP" CBG: Recent Labs  Lab 02/28/22 0732 02/28/22 1214 02/28/22 1647 02/28/22 2105 03/01/22 0720  GLUCAP 107* 91 87 123* 123*   D-Dimer No results for input(s): "DDIMER" in the last 72 hours. Hgb A1c No results for input(s): "HGBA1C" in the last 72 hours. Lipid Profile No results for input(s): "CHOL", "HDL", "LDLCALC", "TRIG", "CHOLHDL", "LDLDIRECT" in the last 72 hours. Thyroid function studies No results for input(s): "TSH", "T4TOTAL", "T3FREE", "THYROIDAB" in the last 72  hours.  Invalid input(s): "FREET3" Anemia work up No results for input(s): "VITAMINB12", "FOLATE", "FERRITIN", "TIBC", "IRON", "RETICCTPCT" in the last 72 hours. Urinalysis    Component Value Date/Time   COLORURINE YELLOW 11/29/2021 1200   APPEARANCEUR HAZY (A) 11/29/2021 1200   LABSPEC 1.020 11/29/2021 1200   PHURINE 6.0 11/29/2021 1200   GLUCOSEU NEGATIVE 11/29/2021 1200   HGBUR SMALL (A) 11/29/2021 1200   BILIRUBINUR NEGATIVE 11/29/2021 1200   KETONESUR NEGATIVE 11/29/2021 1200   PROTEINUR 100 (A) 11/29/2021 1200   UROBILINOGEN 0.2 04/03/2013 1106   NITRITE NEGATIVE 11/29/2021 1200   LEUKOCYTESUR NEGATIVE 11/29/2021 1200   Sepsis Labs Recent Labs  Lab 02/25/22 2145 02/26/22 0402  WBC 7.2 7.6   Microbiology Recent Results (from the past 240 hour(s))  SARS Coronavirus 2 by RT PCR (hospital order, performed in [facility] hospital lab) *cepheid single result test* Anterior Nasal Swab     Status: None   Collection Time: 02/25/22  2:02 AM   Specimen: Anterior Nasal Swab  Result Value Ref Range Status   SARS Coronavirus 2 by RT PCR NEGATIVE NEGATIVE Final    Comment: (NOTE) SARS-CoV-2 target nucleic acids are NOT DETECTED.  The SARS-CoV-2 RNA is generally detectable in upper and lower respiratory specimens during the acute phase of infection. The lowest concentration of SARS-CoV-2 viral copies this assay can detect is 250 copies / mL. A negative result does not preclude SARS-CoV-2 infection and should not be used as the sole basis for treatment or other patient management decisions.  A negative result may occur with improper specimen collection / handling, submission of specimen other than nasopharyngeal swab, presence of viral mutation(s) within the areas targeted by this assay, and inadequate number of viral copies (<250 copies / mL). A negative result must be combined with clinical observations, patient history, and epidemiological information.  Fact Sheet for  Patients:   https://www.patel.info/  Fact Sheet for Healthcare Providers: https://hall.com/  This test is not yet approved or  cleared by the [place] and has been authorized for detection and/or diagnosis of SARS-CoV-2 by FDA under an Emergency Use Authorization (EUA).  This EUA will remain in effect (meaning this test can be used) for the duration of the COVID-19 declaration under Section 564(b)(1) of the Act, 21 U.S.C. section 360bbb-3(b)(1), unless the authorization is terminated or revoked sooner.  Performed at [facility], [place] [address], [place], [place] [address]     Please note: You were cared for by a hospitalist during your hospital stay. Once you are discharged, your primary care physician will handle any further medical issues. Please note that NO REFILLS for any discharge medications will be authorized once you are discharged, as it is imperative that you return to your primary care physician (or establish a relationship with a primary care physician if you do not have one) for your post hospital discharge needs so that they can reassess your need for medications and monitor your lab values.    Time coordinating discharge: 40 minutes  SIGNED:   Shelly Coss, MD  [facility] 03/01/2022, 10:20 AM Pager [phone]  If 7PM-7AM, please contact night-coverage [email or web address] Password [email or web address]

## 2022-03-02 DIAGNOSIS — F17210 Nicotine dependence, cigarettes, uncomplicated: Secondary | ICD-10-CM | POA: Diagnosis not present

## 2022-03-02 DIAGNOSIS — D500 Iron deficiency anemia secondary to blood loss (chronic): Secondary | ICD-10-CM | POA: Diagnosis not present

## 2022-03-02 DIAGNOSIS — K922 Gastrointestinal hemorrhage, unspecified: Secondary | ICD-10-CM | POA: Diagnosis not present

## 2022-03-02 DIAGNOSIS — I110 Hypertensive heart disease with heart failure: Secondary | ICD-10-CM | POA: Diagnosis not present

## 2022-03-02 DIAGNOSIS — J9611 Chronic respiratory failure with hypoxia: Secondary | ICD-10-CM | POA: Diagnosis not present

## 2022-03-02 DIAGNOSIS — J449 Chronic obstructive pulmonary disease, unspecified: Secondary | ICD-10-CM | POA: Diagnosis not present

## 2022-03-02 DIAGNOSIS — I2510 Atherosclerotic heart disease of native coronary artery without angina pectoris: Secondary | ICD-10-CM | POA: Diagnosis not present

## 2022-03-02 DIAGNOSIS — Z9981 Dependence on supplemental oxygen: Secondary | ICD-10-CM | POA: Diagnosis not present

## 2022-03-02 DIAGNOSIS — E1151 Type 2 diabetes mellitus with diabetic peripheral angiopathy without gangrene: Secondary | ICD-10-CM | POA: Diagnosis not present

## 2022-03-02 DIAGNOSIS — I5030 Unspecified diastolic (congestive) heart failure: Secondary | ICD-10-CM | POA: Diagnosis not present

## 2022-03-02 DIAGNOSIS — Z7984 Long term (current) use of oral hypoglycemic drugs: Secondary | ICD-10-CM | POA: Diagnosis not present

## 2022-03-06 ENCOUNTER — Ambulatory Visit (INDEPENDENT_AMBULATORY_CARE_PROVIDER_SITE_OTHER): Payer: Medicare Other | Admitting: Pulmonary Disease

## 2022-03-06 ENCOUNTER — Encounter: Payer: Self-pay | Admitting: Pulmonary Disease

## 2022-03-06 ENCOUNTER — Ambulatory Visit: Payer: Medicare Other

## 2022-03-06 VITALS — BP 80/50 | HR 50 | Ht 59.0 in | Wt 214.8 lb

## 2022-03-06 DIAGNOSIS — J449 Chronic obstructive pulmonary disease, unspecified: Secondary | ICD-10-CM | POA: Diagnosis not present

## 2022-03-06 DIAGNOSIS — I5033 Acute on chronic diastolic (congestive) heart failure: Secondary | ICD-10-CM | POA: Diagnosis not present

## 2022-03-06 DIAGNOSIS — F17200 Nicotine dependence, unspecified, uncomplicated: Secondary | ICD-10-CM

## 2022-03-06 DIAGNOSIS — J9601 Acute respiratory failure with hypoxia: Secondary | ICD-10-CM

## 2022-03-06 NOTE — Progress Notes (Signed)
PFT done today. 

## 2022-03-06 NOTE — Patient Instructions (Signed)
Thank you for visiting Dr. Valeta Harms at [facility]. Today we recommend the following:  Must stay on oxygen, goal O2 sats >90%  Continue use of trelegy  Must take fluid pills and follow up with cardiology   Return in about 3 months (around 06/05/2022) for with APP or Dr. Valeta Harms.    Please do your part to reduce the spread of COVID-19.

## 2022-03-06 NOTE — Progress Notes (Signed)
Synopsis: Referred in June 2023 for CAP by Benito Mccreedy, MD  Subjective:   PATIENT ID: [name]: female DOB: 1950-12-29, MRN: [id number]  Chief Complaint  Patient presents with   Follow-up    [name] presents today for hospital follow-up from recent admission due to streptococcus pneumonia bacteremia and community-acquired pneumonia. Complicated by baseline COPD on 2L home oxygen.  She states that over the last several months she has been felling quite unwell due to increased swelling with pain in her legs and worsened dyspnea. In December she was able to ambulate freely without significant dyspnea however now struggles to make the short trip to the bathroom at home without significant dyspnea. She previously had a cough but this has dissipated. She does endorse some wheezing but states that this too has improved. She was given a DuoNeb and Adair Patter at hospital discharge however is now out of [place]. She does not feel strongly that she has noticed a difference in her symptoms without it. She uses an albuterol nebulizer three times daily and carries an albuterol inhaler with her when she leaves the home so infrequently uses this.  She did not understand why she has had such severe swelling in her legs and fluid in her lungs, nor did she know why she is on lasix. We discussed in depth why lasix helps with fluid build up both in the lungs and legs and that she has been diagnosed with heart failure. We discussed how the combination of her COPD and heart failure exacerbations can cause her breathing to be so much worse as well.  She is discouraged by the recent decline in her functional status. She is hopeful that getting a good medication regimen will better control her symptoms and allow her to regain some of her previous function. She does ask if she has to wear her home oxygen at all times, even if she does not feel symptomatic, to which I explained that she  does not.  OV 03/06/2022: Here today for follow-up.  Last seen in the office in July.  She was recently admitted on 02/25/2022 to [facility] hospital for shortness of breath.  Discharge summary from Dr. Tawanna Solo reviewed.  Felt like she had acute on chronic diastolic heart failure was treated with diuretics, Aldactone, losartan discharged on oral Lasix.  She was requiring oxygen at time of discharge.  Discharged on same inhaler regimen with Trelegy Ellipta.  She comes today to clinic not using her oxygen.  Her initial O2 sats on room air were 80%.  She was placed on 2 L and recovered to 95%.  Her blood pressure was also initially low.  She states that she has been taking her medications.  She does have lower extremity edema.  She still feels short of breath when she exerts herself.   Past Medical History:  Diagnosis Date   Anemia 12/26/2015   Anxiety    Arthritis    Blood transfusion without reported diagnosis    Chronic pain    resolved per pt 04/12/20   COPD (chronic obstructive pulmonary disease) (HCC)    Coronary artery disease    Depression    Diabetes mellitus without complication (HCC)    type 2   GERD (gastroesophageal reflux disease)    Heart murmur    since birth; Echo 02/18/19: LVEF 02%, grade 1 diastolic dysfunction, mild AS (mean grad 12 mmHg), trace MR/PR, mild TR, PASP 32 mmHg     Hyperlipemia  Hypertension    PVD (peripheral vascular disease) (HCC)    right SFA stent 02/11/17 by Dr. Einar Gip   Sleep apnea    does not use cpap   Wears glasses      Family History  Problem Relation Age of Onset   Diabetes Mother    Hypertension Mother    Heart disease Mother    Heart disease Father    Hypertension Father    Hypertension Sister    Hypertension Brother    Diabetes Brother    Hypertension Sister    Hypertension Brother    Diabetes Brother    Hypertension Brother    Hypertension Brother    Hypertension Brother      Past Surgical History:  Procedure Laterality Date    ABDOMINAL HYSTERECTOMY     CARDIAC CATHETERIZATION     CATARACT EXTRACTION     CESAREAN SECTION     COLONOSCOPY N/A 01/08/2013   Procedure: COLONOSCOPY;  Surgeon: Beryle Beams, MD;  Location: [facility];  Service: Endoscopy;  Laterality: N/A;   COLONOSCOPY N/A 12/28/2015   Procedure: COLONOSCOPY;  Surgeon: Carol Ada, MD;  Location: [place];  Service: Endoscopy;  Laterality: N/A;   COLONOSCOPY WITH PROPOFOL N/A 10/05/2020   Procedure: COLONOSCOPY WITH PROPOFOL;  Surgeon: Carol Ada, MD;  [facility]: [facility];  Service: Endoscopy;  Laterality: N/A;   ENTEROSCOPY N/A 12/28/2015   Procedure: ENTEROSCOPY;  Surgeon: Carol Ada, MD;  Location: [place];  Service: Endoscopy;  Laterality: N/A;   ENTEROSCOPY N/A 02/20/2018   Procedure: ENTEROSCOPY;  Surgeon: Carol Ada, MD;  [facility];  Service: Endoscopy;  Laterality: N/A;   ENTEROSCOPY N/A 03/27/2018   Procedure: ENTEROSCOPY;  Surgeon: Carol Ada, MD;  [facility];  Service: Endoscopy;  Laterality: N/A;   ENTEROSCOPY N/A 10/05/2020   Procedure: ENTEROSCOPY;  Surgeon: Carol Ada, MD;  [facility];  Service: Endoscopy;  Laterality: N/A;   ENTEROSCOPY N/A 05/11/2021   Procedure: ENTEROSCOPY;  Surgeon: Carol Ada, MD;  [facility];  Service: Endoscopy;  Laterality: N/A;   ENTEROSCOPY N/A 09/23/2021   Procedure: ENTEROSCOPY;  Surgeon: Doran Stabler, MD;  Location: [place];  Service: Gastroenterology;  Laterality: N/A;   ESOPHAGOGASTRODUODENOSCOPY N/A 01/08/2013   Procedure: ESOPHAGOGASTRODUODENOSCOPY (EGD);  Surgeon: Beryle Beams, MD;  Location: [facility];  Service: Endoscopy;  Laterality: N/A;   EYE SURGERY     GIVENS CAPSULE STUDY N/A 09/24/2021   Procedure: GIVENS CAPSULE STUDY;  Surgeon: Carol Ada, MD;  Location: [place];  Service: Gastroenterology;  Laterality: N/A;   HAND SURGERY     HEMOSTASIS CLIP PLACEMENT  05/11/2021   Procedure: HEMOSTASIS CLIP  PLACEMENT;  Surgeon: Carol Ada, MD;  [facility];  Service: Endoscopy;;   HOT HEMOSTASIS N/A 12/28/2015   Procedure: HOT HEMOSTASIS (ARGON PLASMA COAGULATION/BICAP);  Surgeon: Carol Ada, MD;  Location: [facility] ENDOSCOPY;  Service: Endoscopy;  Laterality: N/A;   HOT HEMOSTASIS N/A 02/20/2018   Procedure: HOT HEMOSTASIS (ARGON PLASMA COAGULATION/BICAP);  Surgeon: Carol Ada, MD;  Location: [facility];  Service: Endoscopy;  Laterality: N/A;   HOT HEMOSTASIS N/A 03/27/2018   Procedure: HOT HEMOSTASIS (ARGON PLASMA COAGULATION/BICAP);  Surgeon: Carol Ada, MD;  Location: [facility];  Service: Endoscopy;  Laterality: N/A;   HOT HEMOSTASIS N/A 10/05/2020   Procedure: HOT HEMOSTASIS (ARGON PLASMA COAGULATION/BICAP);  Surgeon: Carol Ada, MD;  Location: [facility];  Service: Endoscopy;  Laterality: N/A;   HOT HEMOSTASIS N/A 05/11/2021   Procedure: HOT HEMOSTASIS (ARGON PLASMA COAGULATION/BICAP);  Surgeon: Carol Ada, MD;  Location: [facility];  Service: Endoscopy;  Laterality: N/A;   LEFT HEART CATHETERIZATION WITH CORONARY ANGIOGRAM N/A 03/09/2013   Procedure: LEFT HEART CATHETERIZATION WITH CORONARY ANGIOGRAM;  Surgeon: Laverda Page, MD;  Location: [facility] CATH LAB;  Service: Cardiovascular;  Laterality: N/A;   LOWER EXTREMITY ANGIOGRAM N/A 12/29/2012   Procedure: LOWER EXTREMITY ANGIOGRAM;  Surgeon: Laverda Page, MD;  Location: [facility] CATH LAB;  Service: Cardiovascular;  Laterality: N/A;   LOWER EXTREMITY ANGIOGRAM N/A 10/05/2013   Procedure: LOWER EXTREMITY ANGIOGRAM;  Surgeon: Laverda Page, MD;  Location: [facility] C[facility] LAB;  Service: Cardiovascular;  Laterality: N/A;   LOWER EXTREMITY ANGIOGRAPHY N/A 02/11/2017   Procedure: Lower Extremity Angiography;  Surgeon: Adrian Prows, MD;  Location: [place] CV LAB;  Service: Cardiovascular;  Laterality: N/A;   PERIPHERAL VASCULAR BALLOON ANGIOPLASTY  02/11/2017   Procedure: PERIPHERAL VASCULAR BALLOON ANGIOPLASTY;  Surgeon: Adrian Prows,  MD;  Location: [place] CV LAB;  Service: Cardiovascular;;  Right SFA   PERIPHERAL VASCULAR INTERVENTION Right 02/11/2017   Procedure: PERIPHERAL VASCULAR INTERVENTION;  Surgeon: Adrian Prows, MD;  Location: [place] CV LAB;  Service: Cardiovascular;  Laterality: Right;  Rt SFA   POLYPECTOMY  10/05/2020   Procedure: POLYPECTOMY;  Surgeon: Carol Ada, MD;  Location: [facility];  Service: Endoscopy;;   stent in right leg  12/29/2012   for blood clot, Dr. Nadyne Coombes   TOTAL KNEE ARTHROPLASTY Left 04/18/2020   TOTAL KNEE ARTHROPLASTY Left 04/18/2020   Procedure: LEFT TOTAL KNEE ARTHROPLASTY;  Surgeon: Meredith Pel, MD;  Location: [place];  Service: Orthopedics;  Laterality: Left;   TYMPANOMASTOIDECTOMY Left 03/08/2014   Procedure: TYMPANOMASTOIDECTOMY LEFT;  Surgeon: Ascencion Dike, MD;  Location: [place];  Service: ENT;  Laterality: Left;   TYMPANOMASTOIDECTOMY  2015   UTERINE FIBROID SURGERY      Social History   Socioeconomic History   Marital status: Divorced    Spouse name: Not on file   Number of children: 3   Years of education: Not on file   Highest education level: Not on file  Occupational History   Not on file  Tobacco Use   Smoking status: Former    Packs/day: 0.50    Years: 54.00    Total pack years: 27.00    Types: Cigarettes    Start date: 08/18/1966    Quit date: 05/25/2021    Years since quitting: 0.7   Smokeless tobacco: Never  Vaping Use   Vaping Use: Never used  Substance and Sexual Activity   Alcohol use: Yes    Comment: occasional   Drug use: Yes    Types: Marijuana, Cocaine    Comment: Last use 04/11/20   Sexual activity: Not Currently    Birth control/protection: Surgical    Comment: Hysterectomy  Other Topics Concern   Not on file  Social History Narrative   Not on file   Social Determinants of Health   Financial Resource Strain: Not on file  Food Insecurity: No Food Insecurity (02/26/2022)   Hunger Vital Sign    Worried  About Running Out of Food in the Last Year: Never true    Ran Out of Food in the Last Year: Never true  Transportation Needs: No Transportation Needs (02/26/2022)   PRAPARE - Hydrologist (Medical): No    Lack of Transportation (Non-Medical): No  Physical Activity: Insufficiently Active (02/04/2022)   Exercise Vital Sign    Days  of Exercise per Week: 1 day    Minutes of Exercise per Session: 10 min  Stress: Stress Concern Present (02/04/2022)   [place]    Feeling of Stress : To some extent  Social Connections: Not on file  Intimate Partner Violence: Not At Risk (02/26/2022)   Humiliation, Afraid, Rape, and Kick questionnaire    Fear of Current or Ex-Partner: No    Emotionally Abused: No    Physically Abused: No    Sexually Abused: No     No Known Allergies   Outpatient Medications Prior to Visit  Medication Sig Dispense Refill   albuterol (PROVENTIL) (2.5 MG/3ML) 0.083% nebulizer solution Take 2.5 mg by nebulization every 6 (six) hours as needed for wheezing or shortness of breath.     albuterol (VENTOLIN HFA) 108 (90 Base) MCG/ACT inhaler Inhale 1-2 puffs into the lungs every 6 (six) hours as needed for wheezing or shortness of breath. (Patient taking differently: Inhale 2 puffs into the lungs every 6 (six) hours as needed for wheezing or shortness of breath.) 8.5 g 1   fluticasone (FLONASE) 50 MCG/ACT nasal spray Place 1 spray into both nostrils daily as needed for allergies.     Fluticasone-Umeclidin-Vilant (TRELEGY ELLIPTA) 100-62.5-25 MCG/ACT AEPB Inhale 1 puff into the lungs daily. 1 each 5   ipratropium-albuterol (DUONEB) 0.5-2.5 (3) MG/3ML SOLN Take 3 mLs by nebulization 3 (three) times daily. (Patient taking differently: Take 3 mLs by nebulization 3 (three) times daily as needed (wheezing).) 270 mL 1   amLODipine (NORVASC) 10 MG tablet Take 10 mg by mouth daily.     ASPIRIN LOW DOSE 81  MG EC tablet TAKE 1 TABLET(81 MG) BY MOUTH DAILY (Patient taking differently: Take 81 mg by mouth daily.) 30 tablet 0   atorvastatin (LIPITOR) 80 MG tablet Take 80 mg by mouth daily.     Cholecalciferol (VITAMIN D-3 PO) Take 1 capsule by mouth 2 (two) times daily.     cilostazol (PLETAL) 50 MG tablet Take 50 mg by mouth 2 (two) times daily.     ezetimibe (ZETIA) 10 MG tablet TAKE 1 TABLET BY MOUTH DAILY (Patient taking differently: Take 10 mg by mouth daily.) 90 tablet 3   ferrous sulfate 325 (65 FE) MG tablet Take 325 mg by mouth daily.     furosemide (LASIX) 40 MG tablet Take 1 tablet (40 mg total) by mouth 2 (two) times daily. 60 tablet 0   gabapentin (NEURONTIN) 600 MG tablet Take 1 tablet (600 mg total) by mouth at bedtime. 30 tablet 11   LINZESS 145 MCG CAPS capsule Take 145 mcg by mouth daily as needed (Constipation).     losartan (COZAAR) 25 MG tablet Take 1 tablet (25 mg total) by mouth daily. 30 tablet 0   metFORMIN (GLUCOPHAGE) 500 MG tablet Take 0.5 tablets (250 mg total) by mouth 2 (two) times daily with a meal.     metoprolol tartrate (LOPRESSOR) 25 MG tablet Take 1 tablet (25 mg total) by mouth 2 (two) times daily. 60 tablet 11   MYRBETRIQ 25 MG TB24 tablet Take 25 mg by mouth daily.     nitroGLYCERIN (NITROSTAT) 0.4 MG SL tablet Place 1 tablet (0.4 mg total) under the tongue every 5 (five) minutes as needed for chest pain. 90 tablet 3   OXYGEN Inhale 3 L into the lungs continuous.     pantoprazole (PROTONIX) 40 MG tablet Take 1 tablet (40 mg total) by mouth 2 (two)  times daily. (Patient taking differently: Take 40 mg by mouth daily.) 60 tablet 0   potassium chloride SA (KLOR-CON M) 20 MEQ tablet Take 1 tablet (20 mEq total) by mouth daily. 30 tablet 0   spironolactone (ALDACTONE) 50 MG tablet TAKE 1 TABLET BY MOUTH DAILY (Patient taking differently: Take 50 mg by mouth daily.) 100 tablet 2   No facility-administered medications prior to visit.    Review of Systems   Constitutional:  Negative for chills, fever, malaise/fatigue and weight loss.  HENT:  Negative for hearing loss, sore throat and tinnitus.   Eyes:  Negative for blurred vision and double vision.  Respiratory:  Positive for shortness of breath. Negative for cough, hemoptysis, sputum production, wheezing and stridor.   Cardiovascular:  Positive for leg swelling. Negative for chest pain, palpitations, orthopnea and PND.  Gastrointestinal:  Negative for abdominal pain, constipation, diarrhea, heartburn, nausea and vomiting.  Genitourinary:  Negative for dysuria, hematuria and urgency.  Musculoskeletal:  Negative for joint pain and myalgias.  Skin:  Negative for itching and rash.  Neurological:  Negative for dizziness, tingling, weakness and headaches.  Endo/Heme/Allergies:  Negative for environmental allergies. Does not bruise/bleed easily.  Psychiatric/Behavioral:  Negative for depression. The patient is not nervous/anxious and does not have insomnia.   All other systems reviewed and are negative.    Objective:  Physical Exam Constitutional:      Appearance: She is obese.  HENT:     Head: Normocephalic and atraumatic.  Eyes:     Pupils: Pupils are equal, round, and reactive to light.  Cardiovascular:     Rate and Rhythm: Normal rate and regular rhythm.     Heart sounds: Normal heart sounds. No murmur heard. Pulmonary:     Effort: Pulmonary effort is normal. No respiratory distress.     Breath sounds: Normal breath sounds. No wheezing or rales.  Abdominal:     Palpations: Abdomen is soft.  Musculoskeletal:     Cervical back: Neck supple.     Right lower leg: Edema present.     Left lower leg: Edema present.  Skin:    General: Skin is warm and dry.  Neurological:     General: No focal deficit present.  Psychiatric:        Mood and Affect: Mood normal.      Vitals:   03/06/22 1039  BP: (!) 80/50  Pulse: (!) 50  SpO2: 95%  Weight: 214 lb 12.8 oz (97.4 kg)  Height: 4'  11" (1.499 m)   95% on RA at rest BMI Readings from Last 3 Encounters:  03/06/22 43.38 kg/m  03/01/22 42.15 kg/m  01/16/22 41.81 kg/m   Wt Readings from Last 3 Encounters:  03/06/22 214 lb 12.8 oz (97.4 kg)  03/01/22 215 lb 13.3 oz (97.9 kg)  01/16/22 214 lb 1.6 oz (97.1 kg)     CBC    Component Value Date/Time   WBC 7.6 02/26/2022 0402   RBC 4.09 02/26/2022 0402   HGB 9.1 (L) 02/26/2022 0402   HGB 12.3 04/09/2018 1241   HCT 33.2 (L) 02/26/2022 0402   HCT 26.8 (L) 12/26/2015 0539   PLT 505 (H) 02/26/2022 0402   PLT 404 (H) 04/09/2018 1241   MCV 81.2 02/26/2022 0402   MCH 22.2 (L) 02/26/2022 0402   MCHC 27.4 (L) 02/26/2022 0402   RDW 17.6 (H) 02/26/2022 0402   LYMPHSABS 1.2 02/25/2022 2145   MONOABS 0.5 02/25/2022 2145   EOSABS 0.2 02/25/2022 2145   BASOSABS  0.0 02/25/2022 2145    Chest Imaging: Chest x-ray 11/2021: 1. Patchy left lower lung opacities may represent atelectasis or infection. 2.  Mild cardiomegaly is unchanged.  Chest x-ray 02/25/2022: Pulmonary vascular congestion. The patient's images have been independently reviewed by me.    Pulmonary Functions Testing Results:    Latest Ref Rng & Units 03/06/2022    9:42 AM  PFT Results  FVC-Pre L 1.26  P  FVC-Predicted Pre % 53  P  FVC-Post L 1.20  P  FVC-Predicted Post % 50  P  Pre FEV1/FVC % % 91  P  Post FEV1/FCV % % 93  P  FEV1-Pre L 1.15  P  FEV1-Predicted Pre % 64  P  FEV1-Post L 1.12  P  DLCO uncorrected ml/min/mmHg 7.98  P  DLCO UNC% % 49  P  DLCO corrected ml/min/mmHg 9.53  P  DLCO COR %Predicted % 58  P  DLVA Predicted % 85  P  TLC L 3.03  P  TLC % Predicted % 70  P  RV % Predicted % 80  P    P Preliminary result    Echocardiogram: 11/2021 1. Left ventricular ejection fraction, by estimation, is 55 to 60%. The  left ventricle has normal function.  2. Right ventricular systolic function is normal. The right ventricular size is normal.  3. Anterior mitral valve leaflet is  thickened. 4. No large vegetations visualized. 5. Consider TEE if suspicion is high for infective endocarditis.     Assessment & Plan:     ICD-10-CM   1. Stage 2 moderate COPD by GOLD classification ([place])  J44.9     2. Acute hypoxemic respiratory failure (HCC)  J96.01     3. Acute on chronic diastolic heart failure (HCC)  I50.33        Discussion:  This is a 72 year old female, stage II COPD on PFTs completed today.  We reviewed them in the office.  She has diastolic heart failure and lower extremity edema.  She is doing better than she was when she was in the hospital.  She does state that she is compliant with medications.  Plan: Continue Trelegy She needs oxygen supplementation.  She did not bring her oxygen with her to the office.  We will not discharge her from clinic to return home without oxygen supply. She is going to have to call her family to see if they can bring her oxygen to the office so they can transport her home safely. Her initial O2 sat readings were in the 80s. She needs to follow-up with her cardiologist, she has an appointment to see Dr. Einar Gip next week. She needs to continue her diuretics and I encouraged her to watch her lower extremity edema and watch her weight daily.  If those increase the need to let us know or her cardiologist know that her weight is increasing.     Current Outpatient Medications:    albuterol (PROVENTIL) (2.5 MG/3ML) 0.083% nebulizer solution, Take 2.5 mg by nebulization every 6 (six) hours as needed for wheezing or shortness of breath., Disp: , Rfl:    albuterol (VENTOLIN HFA) 108 (90 Base) MCG/ACT inhaler, Inhale 1-2 puffs into the lungs every 6 (six) hours as needed for wheezing or shortness of breath. (Patient taking differently: Inhale 2 puffs into the lungs every 6 (six) hours as needed for wheezing or shortness of breath.), Disp: 8.5 g, Rfl: 1   fluticasone (FLONASE) 50 MCG/ACT nasal spray, Place 1 spray into both nostrils  daily  as needed for allergies., Disp: , Rfl:    Fluticasone-Umeclidin-Vilant (TRELEGY ELLIPTA) 100-62.5-25 MCG/ACT AEPB, Inhale 1 puff into the lungs daily., Disp: 1 each, Rfl: 5   ipratropium-albuterol (DUONEB) 0.5-2.5 (3) MG/3ML SOLN, Take 3 mLs by nebulization 3 (three) times daily. (Patient taking differently: Take 3 mLs by nebulization 3 (three) times daily as needed (wheezing).), Disp: 270 mL, Rfl: 1   amLODipine (NORVASC) 10 MG tablet, Take 10 mg by mouth daily., Disp: , Rfl:    ASPIRIN LOW DOSE 81 MG EC tablet, TAKE 1 TABLET(81 MG) BY MOUTH DAILY (Patient taking differently: Take 81 mg by mouth daily.), Disp: 30 tablet, Rfl: 0   atorvastatin (LIPITOR) 80 MG tablet, Take 80 mg by mouth daily., Disp: , Rfl:    Cholecalciferol (VITAMIN D-3 PO), Take 1 capsule by mouth 2 (two) times daily., Disp: , Rfl:    cilostazol (PLETAL) 50 MG tablet, Take 50 mg by mouth 2 (two) times daily., Disp: , Rfl:    ezetimibe (ZETIA) 10 MG tablet, TAKE 1 TABLET BY MOUTH DAILY (Patient taking differently: Take 10 mg by mouth daily.), Disp: 90 tablet, Rfl: 3   ferrous sulfate 325 (65 FE) MG tablet, Take 325 mg by mouth daily., Disp: , Rfl:    furosemide (LASIX) 40 MG tablet, Take 1 tablet (40 mg total) by mouth 2 (two) times daily., Disp: 60 tablet, Rfl: 0   gabapentin (NEURONTIN) 600 MG tablet, Take 1 tablet (600 mg total) by mouth at bedtime., Disp: 30 tablet, Rfl: 11   LINZESS 145 MCG CAPS capsule, Take 145 mcg by mouth daily as needed (Constipation)., Disp: , Rfl:    losartan (COZAAR) 25 MG tablet, Take 1 tablet (25 mg total) by mouth daily., Disp: 30 tablet, Rfl: 0   metFORMIN (GLUCOPHAGE) 500 MG tablet, Take 0.5 tablets (250 mg total) by mouth 2 (two) times daily with a meal., Disp: , Rfl:    metoprolol tartrate (LOPRESSOR) 25 MG tablet, Take 1 tablet (25 mg total) by mouth 2 (two) times daily., Disp: 60 tablet, Rfl: 11   MYRBETRIQ 25 MG TB24 tablet, Take 25 mg by mouth daily., Disp: , Rfl:    nitroGLYCERIN  (NITROSTAT) 0.4 MG SL tablet, Place 1 tablet (0.4 mg total) under the tongue every 5 (five) minutes as needed for chest pain., Disp: 90 tablet, Rfl: 3   OXYGEN, Inhale 3 L into the lungs continuous., Disp: , Rfl:    pantoprazole (PROTONIX) 40 MG tablet, Take 1 tablet (40 mg total) by mouth 2 (two) times daily. (Patient taking differently: Take 40 mg by mouth daily.), Disp: 60 tablet, Rfl: 0   potassium chloride SA (KLOR-CON M) 20 MEQ tablet, Take 1 tablet (20 mEq total) by mouth daily., Disp: 30 tablet, Rfl: 0   spironolactone (ALDACTONE) 50 MG tablet, TAKE 1 TABLET BY MOUTH DAILY (Patient taking differently: Take 50 mg by mouth daily.), Disp: 100 tablet, Rfl: 2    Garner Nash, DO [facility] 03/06/2022 11:21 AM

## 2022-03-08 ENCOUNTER — Ambulatory Visit
Admission: RE | Admit: 2022-03-08 | Discharge: 2022-03-08 | Disposition: A | Discharge status: Home / Self Care | Payer: Medicare Other | Source: Ambulatory Visit | Attending: Internal Medicine | Admitting: Internal Medicine

## 2022-03-08 ENCOUNTER — Encounter: Payer: Self-pay | Admitting: Internal Medicine

## 2022-03-08 DIAGNOSIS — Z1231 Encounter for screening mammogram for malignant neoplasm of breast: Secondary | ICD-10-CM

## 2022-03-08 DIAGNOSIS — J441 Chronic obstructive pulmonary disease with (acute) exacerbation: Secondary | ICD-10-CM | POA: Diagnosis not present

## 2022-03-11 ENCOUNTER — Other Ambulatory Visit: Payer: Medicare Other

## 2022-03-11 LAB — PULMONARY FUNCTION TEST
DL/VA % pred: 85 %
DL/VA: 3.66 ml/min/mmHg/L
DLCO cor % pred: 58 %
DLCO cor: 9.53 ml/min/mmHg
DLCO unc % pred: 49 %
DLCO unc: 7.98 ml/min/mmHg
FEF 25-75 Post: 1.79 L/sec
FEF 25-75 Pre: 1.56 L/sec
FEF2575-%Change-Post: 14 %
FEF2575-%Pred-Post: 113 %
FEF2575-%Pred-Pre: 99 %
FEV1-%Change-Post: -2 %
FEV1-%Pred-Post: 63 %
FEV1-%Pred-Pre: 64 %
FEV1-Post: 1.12 L
FEV1-Pre: 1.15 L
FEV1FVC-%Change-Post: 2 %
FEV1FVC-%Pred-Pre: 119 %
FEV6-%Change-Post: -4 %
FEV6-%Pred-Post: 53 %
FEV6-%Pred-Pre: 55 %
FEV6-Post: 1.2 L
FEV6-Pre: 1.26 L
FEV6FVC-%Pred-Post: 105 %
FEV6FVC-%Pred-Pre: 105 %
FVC-%Change-Post: -4 %
FVC-%Pred-Post: 50 %
FVC-%Pred-Pre: 53 %
FVC-Post: 1.2 L
FVC-Pre: 1.26 L
Post FEV1/FVC ratio: 93 %
Post FEV6/FVC ratio: 100 %
Pre FEV1/FVC ratio: 91 %
Pre FEV6/FVC Ratio: 100 %
RV % pred: 80 %
RV: 1.58 L
TLC % pred: 70 %
TLC: 3.03 L

## 2022-03-12 ENCOUNTER — Other Ambulatory Visit (HOSPITAL_COMMUNITY): Payer: Self-pay

## 2022-03-13 DIAGNOSIS — D500 Iron deficiency anemia secondary to blood loss (chronic): Secondary | ICD-10-CM | POA: Diagnosis not present

## 2022-03-13 DIAGNOSIS — F17200 Nicotine dependence, unspecified, uncomplicated: Secondary | ICD-10-CM | POA: Diagnosis not present

## 2022-03-13 DIAGNOSIS — I10 Essential (primary) hypertension: Secondary | ICD-10-CM | POA: Diagnosis not present

## 2022-03-13 DIAGNOSIS — E1140 Type 2 diabetes mellitus with diabetic neuropathy, unspecified: Secondary | ICD-10-CM | POA: Diagnosis not present

## 2022-03-13 DIAGNOSIS — J449 Chronic obstructive pulmonary disease, unspecified: Secondary | ICD-10-CM | POA: Diagnosis not present

## 2022-03-13 DIAGNOSIS — E7800 Pure hypercholesterolemia, unspecified: Secondary | ICD-10-CM | POA: Diagnosis not present

## 2022-03-13 DIAGNOSIS — G894 Chronic pain syndrome: Secondary | ICD-10-CM | POA: Diagnosis not present

## 2022-03-14 ENCOUNTER — Ambulatory Visit: Payer: Medicare Other | Admitting: Cardiology

## 2022-03-14 ENCOUNTER — Encounter: Payer: Self-pay | Admitting: Cardiology

## 2022-03-14 VITALS — BP 145/61 | HR 57 | Temp 98.2°F | Resp 16 | Ht 59.0 in | Wt 210.0 lb

## 2022-03-14 DIAGNOSIS — I10 Essential (primary) hypertension: Secondary | ICD-10-CM | POA: Diagnosis not present

## 2022-03-14 DIAGNOSIS — I2510 Atherosclerotic heart disease of native coronary artery without angina pectoris: Secondary | ICD-10-CM | POA: Diagnosis not present

## 2022-03-14 DIAGNOSIS — E1151 Type 2 diabetes mellitus with diabetic peripheral angiopathy without gangrene: Secondary | ICD-10-CM | POA: Diagnosis not present

## 2022-03-14 DIAGNOSIS — I5032 Chronic diastolic (congestive) heart failure: Secondary | ICD-10-CM

## 2022-03-14 DIAGNOSIS — E782 Mixed hyperlipidemia: Secondary | ICD-10-CM

## 2022-03-14 DIAGNOSIS — I6523 Occlusion and stenosis of bilateral carotid arteries: Secondary | ICD-10-CM

## 2022-03-14 DIAGNOSIS — I739 Peripheral vascular disease, unspecified: Secondary | ICD-10-CM

## 2022-03-14 DIAGNOSIS — Z87891 Personal history of nicotine dependence: Secondary | ICD-10-CM

## 2022-03-14 DIAGNOSIS — E119 Type 2 diabetes mellitus without complications: Secondary | ICD-10-CM

## 2022-03-14 NOTE — Progress Notes (Signed)
[place] Date of Birth: 1950/12/06 MRN: [id number] Primary Care Provider:Osei-Bonsu, Iona Beard, MD Former Cardiology Providers: Jeri Lager, APRN, FNP-C Primary Cardiologist: Rex Kras, DO, [facility] (established care 08/30/2019)  Date: 03/14/22 Last Office Visit: 12/06/2021  Chief Complaint  Patient presents with   Follow-up    Posthospitalization    HPI  [name] is a 71 y.o.  female whose past medical history and cardiovascular risk factors include: Established coronary artery disease without angina pectoris, hypertension, hyperlipidemia, former smoker, peripheral artery disease status post PV angiogram with revascularization of the SFA, non-insulin-dependent diabetes, postmenopausal female, advanced age, obesity due to excess calories, history of prior GI bleed, COPD on oxygen via nasal cannula  Patient being followed by the practice for management of CAD, carotid artery disease, and PAD.    Since last office visit patient was hospitalized in September 2023 for shortness of breath and weight gain (per documentation 20 pounds). She was treated for HFpEF and discharged home and now she presents for follow-up.  Patient denies anginal discomfort or heart failure symptoms.  No significant claudication but her overall ambulation is limited due to shortness of breath and obesity.  Her shortness of breath is multifactorial.  Patient did not bring her medications with her at perform a more accurate med reconciliation.  I did review the discharge summary.   ALLERGIES: No Known Allergies  MEDICATION LIST PRIOR TO VISIT: Current Outpatient Medications on File Prior to Visit  Medication Sig Dispense Refill   albuterol (PROVENTIL) (2.5 MG/3ML) 0.083% nebulizer solution Take 2.5 mg by nebulization every 6 (six) hours as needed for wheezing or shortness of breath.     albuterol (VENTOLIN HFA) 108 (90 Base) MCG/ACT inhaler Inhale 1-2 puffs into the lungs every 6 (six) hours  as needed for wheezing or shortness of breath. (Patient taking differently: Inhale 2 puffs into the lungs every 6 (six) hours as needed for wheezing or shortness of breath.) 8.5 g 1   amLODipine (NORVASC) 10 MG tablet Take 10 mg by mouth daily.     ASPIRIN LOW DOSE 81 MG EC tablet TAKE 1 TABLET(81 MG) BY MOUTH DAILY (Patient taking differently: Take 81 mg by mouth daily.) 30 tablet 0   atorvastatin (LIPITOR) 80 MG tablet Take 80 mg by mouth daily.     Cholecalciferol (VITAMIN D-3 PO) Take 1 capsule by mouth 2 (two) times daily.     cilostazol (PLETAL) 50 MG tablet Take 50 mg by mouth 2 (two) times daily.     ezetimibe (ZETIA) 10 MG tablet TAKE 1 TABLET BY MOUTH DAILY (Patient taking differently: Take 10 mg by mouth daily.) 90 tablet 3   ferrous sulfate 325 (65 FE) MG tablet Take 325 mg by mouth daily.     fluticasone (FLONASE) 50 MCG/ACT nasal spray Place 1 spray into both nostrils daily as needed for allergies.     Fluticasone-Umeclidin-Vilant (TRELEGY ELLIPTA) 100-62.5-25 MCG/ACT AEPB Inhale 1 puff into the lungs daily. 1 each 5   furosemide (LASIX) 40 MG tablet Take 1 tablet (40 mg total) by mouth 2 (two) times daily. 60 tablet 0   gabapentin (NEURONTIN) 600 MG tablet Take 1 tablet (600 mg total) by mouth at bedtime. 30 tablet 11   ipratropium-albuterol (DUONEB) 0.5-2.5 (3) MG/3ML SOLN Take 3 mLs by nebulization 3 (three) times daily. (Patient taking differently: Take 3 mLs by nebulization 3 (three) times daily as needed (wheezing).) 270 mL 1   LINZESS 145 MCG CAPS capsule Take 145 mcg by mouth daily  as needed (Constipation).     losartan (COZAAR) 25 MG tablet Take 1 tablet (25 mg total) by mouth daily. 30 tablet 0   metFORMIN (GLUCOPHAGE) 500 MG tablet Take 0.5 tablets (250 mg total) by mouth 2 (two) times daily with a meal.     metoprolol tartrate (LOPRESSOR) 25 MG tablet Take 1 tablet (25 mg total) by mouth 2 (two) times daily. 60 tablet 11   MYRBETRIQ 25 MG TB24 tablet Take 25 mg by mouth  daily.     nitroGLYCERIN (NITROSTAT) 0.4 MG SL tablet Place 1 tablet (0.4 mg total) under the tongue every 5 (five) minutes as needed for chest pain. 90 tablet 3   OXYGEN Inhale 3 L into the lungs continuous.     pantoprazole (PROTONIX) 40 MG tablet Take 1 tablet (40 mg total) by mouth 2 (two) times daily. (Patient taking differently: Take 40 mg by mouth daily.) 60 tablet 0   potassium chloride SA (KLOR-CON M) 20 MEQ tablet Take 1 tablet (20 mEq total) by mouth daily. 30 tablet 0   spironolactone (ALDACTONE) 50 MG tablet TAKE 1 TABLET BY MOUTH DAILY (Patient taking differently: Take 50 mg by mouth daily.) 100 tablet 2   No current facility-administered medications on file prior to visit.    PAST MEDICAL HISTORY: Past Medical History:  Diagnosis Date   Anemia 12/26/2015   Anxiety    Arthritis    Blood transfusion without reported diagnosis    Chronic pain    resolved per pt 04/12/20   COPD (chronic obstructive pulmonary disease) (HCC)    Coronary artery disease    Depression    Diabetes mellitus without complication (HCC)    type 2   GERD (gastroesophageal reflux disease)    Heart murmur    since birth; Echo 02/18/19: LVEF 06%, grade 1 diastolic dysfunction, mild AS (mean grad 12 mmHg), trace MR/PR, mild TR, PASP 32 mmHg     Hyperlipemia    Hypertension    PVD (peripheral vascular disease) ([place])    right SFA stent 02/11/17 by Dr. Einar Gip   Sleep apnea    does not use cpap   Wears glasses     PAST SURGICAL HISTORY: Past Surgical History:  Procedure Laterality Date   ABDOMINAL HYSTERECTOMY     CARDIAC CATHETERIZATION     CATARACT EXTRACTION     CESAREAN SECTION     COLONOSCOPY N/A 01/08/2013   Procedure: COLONOSCOPY;  Surgeon: Beryle Beams, MD;  Location: [facility];  Service: Endoscopy;  Laterality: N/A;   COLONOSCOPY N/A 12/28/2015   Procedure: COLONOSCOPY;  Surgeon: Carol Ada, MD;  Location: [place];  Service: Endoscopy;  Laterality: N/A;   COLONOSCOPY WITH  PROPOFOL N/A 10/05/2020   Procedure: COLONOSCOPY WITH PROPOFOL;  Surgeon: Carol Ada, MD;  Location: [facility];  Service: Endoscopy;  Laterality: N/A;   ENTEROSCOPY N/A 12/28/2015   Procedure: ENTEROSCOPY;  Surgeon: Carol Ada, MD;  Location: [place];  Service: Endoscopy;  Laterality: N/A;   ENTEROSCOPY N/A 02/20/2018   Procedure: ENTEROSCOPY;  Surgeon: Carol Ada, MD;  [facility]: [facility];  Service: Endoscopy;  Laterality: N/A;   ENTEROSCOPY N/A 03/27/2018   Procedure: ENTEROSCOPY;  Surgeon: Carol Ada, MD;  [facility]: [facility];  Service: Endoscopy;  Laterality: N/A;   ENTEROSCOPY N/A 10/05/2020   Procedure: ENTEROSCOPY;  Surgeon: Carol Ada, MD;  [facility];  Service: Endoscopy;  Laterality: N/A;   ENTEROSCOPY N/A 05/11/2021   Procedure: ENTEROSCOPY;  Surgeon: Carol Ada, MD;  [facility];  Service: Endoscopy;  Laterality: N/A;   ENTEROSCOPY N/A 09/23/2021   Procedure: ENTEROSCOPY;  Surgeon: Doran Stabler, MD;  Location: [place];  Service: Gastroenterology;  Laterality: N/A;   ESOPHAGOGASTRODUODENOSCOPY N/A 01/08/2013   Procedure: ESOPHAGOGASTRODUODENOSCOPY (EGD);  Surgeon: Beryle Beams, MD;  Location: [facility];  Service: Endoscopy;  Laterality: N/A;   EYE SURGERY     GIVENS CAPSULE STUDY N/A 09/24/2021   Procedure: GIVENS CAPSULE STUDY;  Surgeon: Carol Ada, MD;  Location: [place];  Service: Gastroenterology;  Laterality: N/A;   HAND SURGERY     HEMOSTASIS CLIP PLACEMENT  05/11/2021   Procedure: HEMOSTASIS CLIP PLACEMENT;  Surgeon: Carol Ada, MD;  [facility];  Service: Endoscopy;;   HOT HEMOSTASIS N/A 12/28/2015   Procedure: HOT HEMOSTASIS (ARGON PLASMA COAGULATION/BICAP);  Surgeon: Carol Ada, MD;  Location: [facility] ENDOSCOPY;  Service: Endoscopy;  Laterality: N/A;   HOT HEMOSTASIS N/A 02/20/2018   Procedure: HOT HEMOSTASIS (ARGON PLASMA COAGULATION/BICAP);  Surgeon: Carol Ada, MD;  Location: [facility];   Service: Endoscopy;  Laterality: N/A;   HOT HEMOSTASIS N/A 03/27/2018   Procedure: HOT HEMOSTASIS (ARGON PLASMA COAGULATION/BICAP);  Surgeon: Carol Ada, MD;  Location: [facility];  Service: Endoscopy;  Laterality: N/A;   HOT HEMOSTASIS N/A 10/05/2020   Procedure: HOT HEMOSTASIS (ARGON PLASMA COAGULATION/BICAP);  Surgeon: Carol Ada, MD;  Location: [facility];  Service: Endoscopy;  Laterality: N/A;   HOT HEMOSTASIS N/A 05/11/2021   Procedure: HOT HEMOSTASIS (ARGON PLASMA COAGULATION/BICAP);  Surgeon: Carol Ada, MD;  Location: [facility];  Service: Endoscopy;  Laterality: N/A;   LEFT HEART CATHETERIZATION WITH CORONARY ANGIOGRAM N/A 03/09/2013   Procedure: LEFT HEART CATHETERIZATION WITH CORONARY ANGIOGRAM;  Surgeon: Laverda Page, MD;  Location: [facility] CATH LAB;  Service: Cardiovascular;  Laterality: N/A;   LOWER EXTREMITY ANGIOGRAM N/A 12/29/2012   Procedure: LOWER EXTREMITY ANGIOGRAM;  Surgeon: Laverda Page, MD;  [facility] LAB;  Service: Cardiovascular;  Laterality: N/A;   LOWER EXTREMITY ANGIOGRAM N/A 10/05/2013   Procedure: LOWER EXTREMITY ANGIOGRAM;  Surgeon: Laverda Page, MD;  Location: [facility] CATH LAB;  Service: Cardiovascular;  Laterality: N/A;   LOWER EXTREMITY ANGIOGRAPHY N/A 02/11/2017   Procedure: Lower Extremity Angiography;  Surgeon: Adrian Prows, MD;  Location: [place] CV LAB;  Service: Cardiovascular;  Laterality: N/A;   PERIPHERAL VASCULAR BALLOON ANGIOPLASTY  02/11/2017   Procedure: PERIPHERAL VASCULAR BALLOON ANGIOPLASTY;  Surgeon: Adrian Prows, MD;  Location: [place] CV LAB;  Service: Cardiovascular;;  Right SFA   PERIPHERAL VASCULAR INTERVENTION Right 02/11/2017   Procedure: PERIPHERAL VASCULAR INTERVENTION;  Surgeon: Adrian Prows, MD;  Location: [place] CV LAB;  Service: Cardiovascular;  Laterality: Right;  Rt SFA   POLYPECTOMY  10/05/2020   Procedure: POLYPECTOMY;  Surgeon: Carol Ada, MD;  Location: [facility];  Service: Endoscopy;;   stent  in right leg  12/29/2012   for blood clot, Dr. Nadyne Coombes   TOTAL KNEE ARTHROPLASTY Left 04/18/2020   TOTAL KNEE ARTHROPLASTY Left 04/18/2020   Procedure: LEFT TOTAL KNEE ARTHROPLASTY;  Surgeon: Meredith Pel, MD;  Location: [place];  Service: Orthopedics;  Laterality: Left;   TYMPANOMASTOIDECTOMY Left 03/08/2014   Procedure: TYMPANOMASTOIDECTOMY LEFT;  Surgeon: Ascencion Dike, MD;  Location: [place];  Service: ENT;  Laterality: Left;   TYMPANOMASTOIDECTOMY  2015   UTERINE FIBROID SURGERY      FAMILY HISTORY: The patient's family history includes Diabetes in her brother, brother, and mother; Heart disease in her father and mother; Hypertension in her  brother, brother, brother, brother, brother, father, mother, sister, and sister.   SOCIAL HISTORY:  The patient  reports that she quit smoking about 9 months ago. Her smoking use included cigarettes. She started smoking about 55 years ago. She has a 27.00 pack-year smoking history. She has never used smokeless tobacco. She reports current alcohol use. She reports current drug use. Drugs: Marijuana and Cocaine.  Review of Systems  Constitutional: Negative for chills and fever.  HENT:  Negative for hoarse voice and nosebleeds.   Eyes:  Negative for discharge, double vision and pain.  Cardiovascular:  Positive for claudication (stable), dyspnea on exertion and leg swelling. Negative for chest pain, near-syncope, orthopnea, palpitations, paroxysmal nocturnal dyspnea and syncope.  Respiratory:  Negative for hemoptysis and shortness of breath.   Musculoskeletal:  Positive for arthritis and joint pain. Negative for muscle cramps and myalgias.  Gastrointestinal:  Negative for abdominal pain, constipation, diarrhea, hematemesis, hematochezia, melena, nausea and vomiting.  Neurological:  Negative for dizziness and light-headedness.    PHYSICAL EXAM:    03/14/2022    9:51 AM 03/06/2022   10:39 AM 03/01/2022    5:30 AM  Vitals with BMI   Height 4\' 11"  4\' 11"    Weight 210 lbs 214 lbs 13 oz   BMI 03.47 42.59   Systolic 563 80 875  Diastolic 61 50 68  Pulse 57 50 61    CONSTITUTIONAL: Well-developed and well-nourished. No acute distress.  SKIN: Skin is warm and dry. No rash noted. No cyanosis. No pallor. No jaundice HEAD: Normocephalic and atraumatic.  EYES: No scleral icterus MOUTH/THROAT: Moist oral membranes.  Nasal cannula oxygen NECK: No JVD present. No thyromegaly noted.  Left carotid bruit CHEST Normal respiratory effort. No intercostal retractions  LUNGS: Clear to auscultation bilaterally.  No stridor. No wheezes. No rales.  CARDIOVASCULAR: Regular, positive I4-P3, soft systolic ejection murmur, no gallops or rubs. ABDOMINAL: Obese, soft, nontender, nondistended, positive bowel sounds in all 4 quadrants, no apparent ascites.  EXTREMITIES: trace pitting bilateral peripheral edema to mid shin, warm to touch bilaterally.  1+ bilateral femoral pulses (difficult to palpate due to pannus), unable to palpate bilateral popliteal pulses or posterior tibial pulses, 2+ bilateral dorsalis pedis pulses.  Tender to touch over the anterior and lateral aspect of the mid thigh.  No discoloration/cyanosis/clubbing of the distal extremity. HEMATOLOGIC: No significant bruising NEUROLOGIC: Oriented to person, place, and time. Nonfocal. Normal muscle tone.  PSYCHIATRIC: Normal mood and affect. Normal behavior. Cooperative  CARDIAC DATABASE: EKG: 08/14/2021: NSR, 68 bpm, normal axis, PACs, without underlying injury pattern. 09/10/2021: NSR, 94bpm, occasional PVCs.  03/14/2022: Sinus bradycardia, 55 bpm, normal axis, without underlying ischemia or injury pattern.  Echocardiogram: 11/08/2021:  1. Left ventricular ejection fraction, by estimation, is 55 to 60%. The left ventricle has normal function. The left ventricle has no regional wall motion abnormalities. Left ventricular diastolic parameters are  consistent with Grade I diastolic   dysfunction (impaired relaxation).   2. Right ventricular systolic function is moderately reduced. The right ventricular size is moderately enlarged. There is severely elevated pulmonary artery systolic pressure.   3. Right atrial size was moderately dilated.   4. The mitral valve is abnormal. Trivial mitral valve regurgitation. No evidence of mitral stenosis.   5. Tricuspid valve regurgitation is mild to moderate.   6. Gradients stable to slightly lower compared to TTE done 08/29/21. The aortic valve is tricuspid. There is moderate calcification of the aortic valve. There is moderate thickening of the aortic valve. Aortic  valve  regurgitation is not visualized. Mild  aortic valve stenosis.   7. Aortic dilatation noted. There is dilatation of the ascending aorta, measuring 43 mm.   8. The inferior vena cava is dilated in size with >50% respiratory variability, suggesting right atrial pressure of 8 mmHg   Stress Testing:  Exercise myoview stress 05/19/2017: 1. Patient achieved 4.4 METS and reached 83% target heart rate. Exercise capacity low for age. Appropriate heart rate and blood pressure response. Stress symptoms included dyspnea, dizziness. Submaximal exercise stress study. 2. No stress EKG changes suggestive of ischemia. 3. Stress and rest SPECT images demonstrate homogeneous tracer distribution throughout the myocardium. Gated SPECT imaging reveals normal myocardial thickening and wall motion. The left ventricular ejection fraction was normal (65%).   4. This is a low risk submaximal exercise stress study  Heart Catheterization: Coronary angiogram 02/27/2014: Mid RCA 60-70% stenosis, mid LAD 60-70% stenosis. FFR to the LAD, lesion insignificant. Medical therapy for CAD. Moderate aortic valve calcification.  Carotid artery duplex  01/30/2021:  Duplex suggests stenosis in the right internal carotid artery (50-69%).  Duplex suggests stenosis in the right external carotid artery (<50%).  Duplex  suggests stenosis in the left internal carotid artery (50-69%).  Duplex suggests stenosis in the left external carotid artery (<50%).  Antegrade right vertebral artery flow. Antegrade left vertebral artery flow.  Follow up in six months is appropriate if clinically indicated.    Peripheral arteriogram 02/11/2017: Successful PTA with DCB 5x150x2 InPact Admiral in the right proximal to distal SFA and stenting of proximal SFA with DES, [place] 6x100 mm stent. 3 vessel r/o. Left mild disease and 2 vessel R/O. Mild disease left by angiogram 10/05/2013.   VAS US Aorta / IVC/Iliacs 08/17/2021 Calcifications noted throughout the abdominal aorta. There is no evidence of significant stenosis in the aortoiliac arteries, however, this is based on limited visualization.  ABI  08/17/2021 Right: Resting right ankle-brachial index is within normal range. No evidence of significant right lower extremity arterial disease. The right toe-brachial index is normal.   Left: Resting left ankle-brachial index is within normal range. No evidence of significant left lower extremity arterial disease. The left toe-brachial index is abnormal.  Lower extremity arterial duplex: 08/17/2021 Right: 50-74% stenosis noted in the deep femoral artery. Patent stent with no evidence of stenosis in the superficial femoral artery.  LABORATORY DATA:    Latest Ref Rng & Units 02/26/2022    4:02 AM 02/25/2022    9:45 PM 12/03/2021    1:34 AM  CBC  WBC 4.0 - 10.5 K/uL 7.6  7.2  13.3   Hemoglobin 12.0 - 15.0 g/dL 9.1  8.8  7.6   Hematocrit 36.0 - 46.0 % 33.2  32.2  28.2   Platelets 150 - 400 K/uL 505  476  537        Latest Ref Rng & Units 03/01/2022    4:26 AM 02/28/2022    4:52 AM 02/27/2022    4:27 AM  CMP  Glucose 70 - 99 mg/dL 112  106  103   BUN 8 - 23 mg/dL 17  15  12    Creatinine 0.44 - 1.00 mg/dL 1.00  0.78  0.71   Sodium 135 - 145 mmol/L 142  142  141   Potassium 3.5 - 5.1 mmol/L 4.0  3.3  3.7   Chloride 98 - 111 mmol/L 99   100  102   CO2 22 - 32 mmol/L 37  34  33   Calcium 8.9 -  10.3 mg/dL 9.1  8.8  8.6     Lipid Panel  Lab Results  Component Value Date   CHOL 138 03/26/2021   HDL 54 03/26/2021   LDLCALC 69 03/26/2021   LDLDIRECT 71 03/26/2021   TRIG 78 03/26/2021   CHOLHDL 4.5 05/12/2012     Lab Results  Component Value Date   HGBA1C 4.9 09/21/2021   HGBA1C 5.8 (H) 05/09/2021   HGBA1C 6.3 (H) 04/12/2020   No components found for: "NTPROBNP" Lab Results  Component Value Date   TSH 0.315 (L) 08/26/2021   TSH 1.528 05/11/2012   TSH 2.688 01/10/2011    Cardiac Panel (last 3 results) No results for input(s): "CKTOTAL", "CKMB", "TROPONINIHS", "RELINDX" in the last 72 hours.  IMPRESSION:    ICD-10-CM   1. Chronic heart failure with preserved ejection fraction (HFpEF) (HCC)  I50.32     2. Atherosclerosis of native coronary artery of native heart without angina pectoris  I25.10     3. PAD (peripheral artery disease) (HCC)  I73.9     4. Carotid artery stenosis, asymptomatic, bilateral  I65.23 EKG 12-Lead    PCV CAROTID DUPLEX (BILATERAL)    5. Essential hypertension  I10     6. Non-insulin dependent type 2 diabetes mellitus ([place])  E11.9     7. Type 2 diabetes mellitus with peripheral vascular disease (HCC)  E11.51     8. Mixed hyperlipidemia  E78.2     9. Former smoker  Z87.891         RECOMMENDATIONS: [name] is a 71 y.o. female whose past medical history and cardiovascular risk factors include: Established coronary artery disease without angina pectoris, hypertension, hyperlipidemia, former smoker, peripheral artery disease status post PV angiogram with revascularization of the SFA, non-insulin-dependent diabetes, postmenopausal female, advanced age, obesity due to excess calories, history of prior GI bleed, COPD on supplemental oxygen via nasal cannula.   Chronic heart failure with preserved ejection fraction (HFpEF) ([place]) Last hospitalization September 2023.   Stage C, NYHA class II/III.  Reemphasized importance of a low-salt diet. Patient is quite upset to learn that she can red meat, pork, canned foods.  Unable to perform med reconciliation as she does not however medications with her. I would like to uptitrate her GDMT after knowing exactly what she is taking. I will however enroll into remote patient monitoring for CHF to hopefully prevent rehospitalizations and symptom relief.  Monitor for now  Atherosclerosis of native coronary artery of native heart without angina pectoris Denies angina pectoris. EKG nonischemic. Reviewed the last echocardiogram and ischemic evaluation as part of today's office encounter.  PAD (peripheral artery disease) (HCC) ABIs are within acceptable limits bilaterally. SFA stent is patent. She does have disease in the right deep femoral artery based on the arterial duplex; however, overall is asymptomatic and therefore we will treat medically. Recommend continuation of statin therapy, Zetia, cilostazol.  Increase ambulation as tolerated.  Carotid artery stenosis, asymptomatic, bilateral Asymptomatic. Continue current medical therapy. Patient has stopped smoking completely. We will check a carotid duplex prior to the next office was  Essential hypertension Within acceptable limits. Not changing any medications as she do not know exactly which she is taking. Reemphasized importance of a low-salt diet.  Non-insulin dependent type 2 diabetes mellitus (HCC) Last hemoglobin A1c as of March 2023 well-controlled. Currently managed by primary care provider.  Former smoker Quit smoking in March 2023. Currently diagnoses COPD on oxygen dependence. Continues to follow with pulmonary medicine.  FINAL MEDICATION  LIST END OF ENCOUNTER: No orders of the defined types were placed in this encounter.     Current Outpatient Medications:    albuterol (PROVENTIL) (2.5 MG/3ML) 0.083% nebulizer solution, Take 2.5 mg by  nebulization every 6 (six) hours as needed for wheezing or shortness of breath., Disp: , Rfl:    albuterol (VENTOLIN HFA) 108 (90 Base) MCG/ACT inhaler, Inhale 1-2 puffs into the lungs every 6 (six) hours as needed for wheezing or shortness of breath. (Patient taking differently: Inhale 2 puffs into the lungs every 6 (six) hours as needed for wheezing or shortness of breath.), Disp: 8.5 g, Rfl: 1   amLODipine (NORVASC) 10 MG tablet, Take 10 mg by mouth daily., Disp: , Rfl:    ASPIRIN LOW DOSE 81 MG EC tablet, TAKE 1 TABLET(81 MG) BY MOUTH DAILY (Patient taking differently: Take 81 mg by mouth daily.), Disp: 30 tablet, Rfl: 0   atorvastatin (LIPITOR) 80 MG tablet, Take 80 mg by mouth daily., Disp: , Rfl:    Cholecalciferol (VITAMIN D-3 PO), Take 1 capsule by mouth 2 (two) times daily., Disp: , Rfl:    cilostazol (PLETAL) 50 MG tablet, Take 50 mg by mouth 2 (two) times daily., Disp: , Rfl:    ezetimibe (ZETIA) 10 MG tablet, TAKE 1 TABLET BY MOUTH DAILY (Patient taking differently: Take 10 mg by mouth daily.), Disp: 90 tablet, Rfl: 3   ferrous sulfate 325 (65 FE) MG tablet, Take 325 mg by mouth daily., Disp: , Rfl:    fluticasone (FLONASE) 50 MCG/ACT nasal spray, Place 1 spray into both nostrils daily as needed for allergies., Disp: , Rfl:    Fluticasone-Umeclidin-Vilant (TRELEGY ELLIPTA) 100-62.5-25 MCG/ACT AEPB, Inhale 1 puff into the lungs daily., Disp: 1 each, Rfl: 5   furosemide (LASIX) 40 MG tablet, Take 1 tablet (40 mg total) by mouth 2 (two) times daily., Disp: 60 tablet, Rfl: 0   gabapentin (NEURONTIN) 600 MG tablet, Take 1 tablet (600 mg total) by mouth at bedtime., Disp: 30 tablet, Rfl: 11   ipratropium-albuterol (DUONEB) 0.5-2.5 (3) MG/3ML SOLN, Take 3 mLs by nebulization 3 (three) times daily. (Patient taking differently: Take 3 mLs by nebulization 3 (three) times daily as needed (wheezing).), Disp: 270 mL, Rfl: 1   LINZESS 145 MCG CAPS capsule, Take 145 mcg by mouth daily as needed  (Constipation)., Disp: , Rfl:    losartan (COZAAR) 25 MG tablet, Take 1 tablet (25 mg total) by mouth daily., Disp: 30 tablet, Rfl: 0   metFORMIN (GLUCOPHAGE) 500 MG tablet, Take 0.5 tablets (250 mg total) by mouth 2 (two) times daily with a meal., Disp: , Rfl:    metoprolol tartrate (LOPRESSOR) 25 MG tablet, Take 1 tablet (25 mg total) by mouth 2 (two) times daily., Disp: 60 tablet, Rfl: 11   MYRBETRIQ 25 MG TB24 tablet, Take 25 mg by mouth daily., Disp: , Rfl:    nitroGLYCERIN (NITROSTAT) 0.4 MG SL tablet, Place 1 tablet (0.4 mg total) under the tongue every 5 (five) minutes as needed for chest pain., Disp: 90 tablet, Rfl: 3   OXYGEN, Inhale 3 L into the lungs continuous., Disp: , Rfl:    pantoprazole (PROTONIX) 40 MG tablet, Take 1 tablet (40 mg total) by mouth 2 (two) times daily. (Patient taking differently: Take 40 mg by mouth daily.), Disp: 60 tablet, Rfl: 0   potassium chloride SA (KLOR-CON M) 20 MEQ tablet, Take 1 tablet (20 mEq total) by mouth daily., Disp: 30 tablet, Rfl: 0   spironolactone (ALDACTONE) 50  MG tablet, TAKE 1 TABLET BY MOUTH DAILY (Patient taking differently: Take 50 mg by mouth daily.), Disp: 100 tablet, Rfl: 2  Orders Placed This Encounter  Procedures   EKG 12-Lead   PCV CAROTID DUPLEX (BILATERAL)   --Continue cardiac medications as reconciled in final medication list. --Return in about 6 months (around 09/12/2022) for Follow up, heart failure management.. Or sooner if needed. --Continue follow-up with your primary care physician regarding the management of your other chronic comorbid conditions.  Patient's questions and concerns were addressed to her satisfaction. She voices understanding of the instructions provided during this encounter.   This note was created using a voice recognition software as a result there may be grammatical errors inadvertently enclosed that do not reflect the nature of this encounter. Every attempt is made to correct such errors.  Rex Kras, [place], [facility]  Pager: [phone] Office: [phone]

## 2022-03-15 ENCOUNTER — Ambulatory Visit: Payer: Self-pay | Admitting: Licensed Clinical Social Worker

## 2022-03-15 NOTE — Patient Outreach (Signed)
  Care Coordination   Follow Up Visit Note   03/15/2022 Name: [name] MRN: [id number] DOB: 1950/09/25  [name] is a 71 y.o. year old female who sees Osei-Bonsu, Iona Beard, MD for primary care. I spoke with  [name] by phone today.  What matters to the patients health and wellness today? She wants to walk without being short of breath. She wants to continue to receive help as needed from her daughter. She wants transport support as needed.    Goals Addressed               This Visit's Progress     Patient wants to walk without being short of breath.  She wants to continue to receive care as needed from her daughter. She wants to have adequate transport assistance. (pt-stated)        Care Coordination Interventions:  Active Listening utillized Discussed transportation needs of client. Encouraged client to call [facility] insurance provider to talk with this provider about transport benefits for her as a [facility] recipient . Client has some transport help from her family members. Encouraged client to use Medicaid transport benefit as available. She said she has been using that benefit occasionally Discussed walking challenges. She has rollator walker. She completed in home physical therapy and has exercises listed on sheet of paper for her to do as able.  Discussed breathing challenges. She said she gets short of breath when walking across a room . She uses oxygen daily as needed Discussed family support . She and her daughter reside together and she receives support from her daughter Discussed food procurement. She has [employer] U card and uses U card for food needs each month Discussed utility needs. She did not mention any utility needs. She does have some difficultly in sleeping She said that due to swelling in her legs she cannot be as active as she would like . She would like RN to call her to discuss nursing needs. LCSW to make referral to RN Thea Silversmith to call  client to discuss client nursing needs Provided some counseling support for client. Client described some episodes of dizziness.  Discussed weight fluctuation. She thinks this is due to fluid in her system.  She said she takes prescribed diuretic 2X daily. She said she goes to bathroom about 5-6 times per night    SDOH assessments and interventions completed:  Yes  SDOH Interventions Today    Flowsheet Row Most Recent Value  SDOH Interventions   Physical Activity Interventions Other (Comments)  [low activity level. she has walker to use as needed. has swelling in lower extremities]  Stress Interventions Provide Counseling        Care Coordination Interventions Activated:  Yes  Care Coordination Interventions:  Yes, provided   Follow up plan: Referral made to RN Thea Silversmith to please call client in next 2 weeks to discuss nursing needs of client    Encounter Outcome:  Pt. Visit Completed

## 2022-03-15 NOTE — Patient Instructions (Signed)
Visit Information  Thank you for taking time to visit with me today. Please don't hesitate to contact me if I can be of assistance to you before our next scheduled telephone appointment.  Following are the goals we discussed today:    RN Thea Silversmith to call client in next 2 weeks to discuss nursing needs of client. Client agreed to this plan  Please call the care guide team at [phone] if you need to cancel or reschedule your appointment.   If you are experiencing a Mental Health or [place] or need someone to talk to, please go to [facility] [place] [phone])   Following is a copy of your full plan of care:   Care Coordination Interventions:  Active Listening utillized Discussed transportation needs of client. Encouraged client to call [facility] insurance provider to talk with this provider about transport benefits for her as a [employer] [facility] recipient . Client has some transport help from her family members. Encouraged client to use Medicaid transport benefit as available. She said she has been using that benefit occasionally Discussed walking challenges. She has rollator walker. She completed in home physical therapy and has exercises listed on sheet of paper for her to do as able.  Discussed breathing challenges. She said she gets short of breath when walking across a room . She uses oxygen daily as needed Discussed family support . She and her daughter reside together and she receives support from her daughter Discussed food procurement. She has [employer] U card and uses U card for food needs each month Discussed utility needs. She did not mention any utility needs. She does have some difficultly in sleeping She said that due to swelling in her legs she cannot be as active as she would like . She would like RN to call her to discuss nursing needs. LCSW to make referral to RN Thea Silversmith to call client to discuss client nursing  needs Provided some counseling support for client. Client described some episodes of dizziness.  Discussed weight fluctuation. She thinks this is due to fluid in her system.  She said she takes prescribed diuretic 2X daily. She said she goes to bathroom about 5-6 times per night  Ms. Decarlo was given information about Care Management services by the embedded care coordination team including:  Care Management services include personalized support from designated clinical staff supervised by her physician, including individualized plan of care and coordination with other care providers 24/7 contact phone numbers for assistance for urgent and routine care needs. The patient may stop CCM services at any time (effective at the end of the month) by phone call to the office staff.  Patient agreed to services and verbal consent obtained.   [name].Antawn Sison MSW, LCSW Licensed Clinical Social Worker [facility] Care Management [phone]. [phone]

## 2022-03-21 ENCOUNTER — Telehealth: Payer: Self-pay

## 2022-03-21 ENCOUNTER — Other Ambulatory Visit: Payer: Self-pay

## 2022-03-21 ENCOUNTER — Ambulatory Visit: Payer: Self-pay

## 2022-03-21 DIAGNOSIS — I739 Peripheral vascular disease, unspecified: Secondary | ICD-10-CM

## 2022-03-21 DIAGNOSIS — R0789 Other chest pain: Secondary | ICD-10-CM

## 2022-03-21 MED ORDER — NITROGLYCERIN 0.4 MG SL SUBL: 0.4000 mg | SUBLINGUAL_TABLET | SUBLINGUAL | 3 refills | Status: AC | PRN

## 2022-03-21 NOTE — Patient Outreach (Signed)
  Care Coordination   Initial Visit Note   03/21/2022 Name: [name] MRN: [id number] DOB: [birth year]  [name] is a 71 y.o. year old female who sees [name], Carla Beard, MD for primary care. I spoke with  [name] by phone today.  What matters to the patients health and wellness today?  Recent admission 02/26/27-03/01/22 for CHF, acute on chronic respiratory failure. [name] reports "I feel a whole lot better". History of COPD. Uses oxygen at 3l/[place] continuous. She denies any concerns regarding COPD and feels it is managed. Regarding CHF she reports she is being monitored remotely. She reports incident last night where she woke up with chest pain. She reports it went away after one ntg. She states this has been going on "for years", adding she usually drinks water and it goes away. Per review of chart patient reported episode to cardiology today and she states her cardiologist is aware of these episodes. She also reports cramping to side that bothers her when she bend over that has been going on for about 6 months.  Per chart review last visit with cardiologist 03/14/22; last visit with PCP 03/13/22; latest office visit with pulmonologist 03/06/22. She denies any concerns at this time.     Goals Addressed               This Visit's Progress     HF management        Care Coordination Interventions: Reviewed recent instruction from cardiology visit on 03/06/22 Discussed remote cardiac monitoring and provided positive feedback regarding monitoring Encouraged to continue to take medications as prescribed Encouraged patient to contact provider if any questions, concerns or worsening condition Discussed low salt diet and the importance of monitoring salt intake Reviewed upcoming/scheduled appointment. Referral to care guide re: transportation resource need for an appointment at [facility] Discussed steps to take to find provider resources             SDOH assessments  and interventions completed:  Yes   Care Coordination Interventions Activated:  Yes  Care Coordination Interventions:  Yes, provided   Follow up plan: Follow up call scheduled for 04/04/22    Encounter Outcome:  Pt. Visit Completed   Carla Silversmith, RN, MSN, BSN, [place] Coordinator [phone]

## 2022-03-21 NOTE — Patient Instructions (Addendum)
Visit Information  Thank you for taking time to visit with me today. Please don't hesitate to contact me if I can be of assistance to you.   Following are the goals we discussed today:   Goals Addressed               This Visit's Progress     HF management        Care Coordination Interventions: Reviewed recent instruction from cardiology visit on 03/06/22 Discussed remote cardiac monitoring and provided positive feedback regarding monitoring Encouraged to continue to take medications as prescribed Encouraged patient to contact provider if any questions, concerns or worsening condition Discussed low salt diet and the importance of monitoring salt intake Reviewed upcoming/scheduled appointment. Referral to care guide re: transportation resource need for an appointment at [facility] Discussed steps to take to find provider resources          Our next appointment is by telephone on 04/04/22 at 11:30 am  Please call the care guide team at [phone] if you need to cancel or reschedule your appointment.   If you are experiencing a Mental Health or [place] or need someone to talk to, please call the Suicide and Crisis Lifeline: 988  Patient verbalizes understanding of instructions and care plan provided today and agrees to view in [place]. Active MyChart status and patient understanding of how to access instructions and care plan via MyChart confirmed with patient.     Thea Silversmith, RN, MSN, BSN, [place] Coordinator [phone]

## 2022-03-21 NOTE — Telephone Encounter (Signed)
Spoke for RPM 1 week follow-up. Patient states she is doing well. Having issues with scale, we are trying to work through. Patient complained of chest pain in between her breast. She describes as a sharp pain that can last for 10 mins. She usually drinks water it will go away, if it doesn't, she will then use nitroglycerin tablet. Patient states it happens at least 3 times a month, and last night woke her up.  Calling daughter to obtain complete med rec, she assembles medications for patient into a pill box.    BP readings: 03/21/2022 Thursday at 08:14 AM 141 / 73      03/20/2022 Wednesday at 07:47 AM 127 / 82      03/19/2022 Tuesday at 09:04 AM 118 / 72      03/18/2022 Monday at 09:45 AM 136 / 70      03/16/2022 Saturday at 09:04 AM 128 / 78      03/15/2022 Friday at 06:43 AM 137 / 74      03/14/2022 Thursday at 10:57 AM 130 / 84      03/14/2022 Thursday at 10:48 AM 142 / 112

## 2022-03-21 NOTE — Telephone Encounter (Signed)
Spoke with patient's daughter Andee Poles (assembles patient's pillbox weekly) and completed med rec.  1. amlodipine 10 mg daily  2. ASA 81 mg daily  3. atorvastatin 80 mg daily  4. cilostazol 50 mg BID  5. Zetia 10 mg daily  6. furosemide 40 mg BID  7. metoprolol tartrate 25 mg BID  8. nitroglycerin PRN  9. spironolactone 50 mg daily

## 2022-03-22 ENCOUNTER — Other Ambulatory Visit: Payer: Self-pay | Admitting: Cardiology

## 2022-03-22 DIAGNOSIS — R072 Precordial pain: Secondary | ICD-10-CM

## 2022-03-22 DIAGNOSIS — E119 Type 2 diabetes mellitus without complications: Secondary | ICD-10-CM

## 2022-03-22 DIAGNOSIS — E1151 Type 2 diabetes mellitus with diabetic peripheral angiopathy without gangrene: Secondary | ICD-10-CM

## 2022-03-22 DIAGNOSIS — E782 Mixed hyperlipidemia: Secondary | ICD-10-CM

## 2022-03-22 DIAGNOSIS — I2510 Atherosclerotic heart disease of native coronary artery without angina pectoris: Secondary | ICD-10-CM

## 2022-03-22 NOTE — Progress Notes (Signed)
Continues to have precordial discomfort with mixed cardiac and noncardiac features. Unable to exercise on treadmill. We will proceed with pharmacological stress test to evaluate for reversible ischemia. Educated on seeking medical attention sooner by going to the closest ER via EMS if the symptoms increase in intensity, frequency, duration, or has typical chest pain as discussed in the office.  Patient verbalized understanding.  Dvaughn Fickle Corrigan, DO, [facility]

## 2022-03-22 NOTE — Telephone Encounter (Signed)
Start Entresto 24/26mg  po bid.   When she starts Entresto have her decrease lasix to once daily and stop norvsac.   Labs in one week -please order and release BMP, NT-proBNP, and Magnesium level.  Medical assistance: please arrange a Lexiscan stress test and inform the patient to go to ED if her pain increase in intensity, frequency, duration.  Tiamarie Furnari Villanueva, DO, [facility]

## 2022-03-23 DIAGNOSIS — K219 Gastro-esophageal reflux disease without esophagitis: Secondary | ICD-10-CM | POA: Diagnosis not present

## 2022-03-23 DIAGNOSIS — I10 Essential (primary) hypertension: Secondary | ICD-10-CM | POA: Diagnosis not present

## 2022-03-25 ENCOUNTER — Other Ambulatory Visit: Payer: Self-pay

## 2022-03-25 DIAGNOSIS — I5033 Acute on chronic diastolic (congestive) heart failure: Secondary | ICD-10-CM

## 2022-03-25 MED ORDER — ENTRESTO 24-26 MG PO TABS: 1.0000 | ORAL_TABLET | Freq: Two times a day (BID) | ORAL | 2 refills | Status: AC

## 2022-03-26 DIAGNOSIS — J449 Chronic obstructive pulmonary disease, unspecified: Secondary | ICD-10-CM | POA: Diagnosis not present

## 2022-03-26 DIAGNOSIS — D500 Iron deficiency anemia secondary to blood loss (chronic): Secondary | ICD-10-CM | POA: Diagnosis not present

## 2022-03-26 DIAGNOSIS — Z7984 Long term (current) use of oral hypoglycemic drugs: Secondary | ICD-10-CM | POA: Diagnosis not present

## 2022-03-26 DIAGNOSIS — J9621 Acute and chronic respiratory failure with hypoxia: Secondary | ICD-10-CM | POA: Diagnosis not present

## 2022-03-26 DIAGNOSIS — K922 Gastrointestinal hemorrhage, unspecified: Secondary | ICD-10-CM | POA: Diagnosis not present

## 2022-03-26 DIAGNOSIS — I5033 Acute on chronic diastolic (congestive) heart failure: Secondary | ICD-10-CM | POA: Diagnosis not present

## 2022-03-26 DIAGNOSIS — J9622 Acute and chronic respiratory failure with hypercapnia: Secondary | ICD-10-CM | POA: Diagnosis not present

## 2022-03-26 DIAGNOSIS — I2510 Atherosclerotic heart disease of native coronary artery without angina pectoris: Secondary | ICD-10-CM | POA: Diagnosis not present

## 2022-03-26 DIAGNOSIS — F17210 Nicotine dependence, cigarettes, uncomplicated: Secondary | ICD-10-CM | POA: Diagnosis not present

## 2022-03-26 DIAGNOSIS — E1151 Type 2 diabetes mellitus with diabetic peripheral angiopathy without gangrene: Secondary | ICD-10-CM | POA: Diagnosis not present

## 2022-03-26 DIAGNOSIS — Z9981 Dependence on supplemental oxygen: Secondary | ICD-10-CM | POA: Diagnosis not present

## 2022-03-26 DIAGNOSIS — I110 Hypertensive heart disease with heart failure: Secondary | ICD-10-CM | POA: Diagnosis not present

## 2022-03-27 DIAGNOSIS — K5521 Angiodysplasia of colon with hemorrhage: Secondary | ICD-10-CM | POA: Diagnosis not present

## 2022-03-27 DIAGNOSIS — D500 Iron deficiency anemia secondary to blood loss (chronic): Secondary | ICD-10-CM | POA: Diagnosis not present

## 2022-03-29 DIAGNOSIS — J9621 Acute and chronic respiratory failure with hypoxia: Secondary | ICD-10-CM | POA: Diagnosis not present

## 2022-03-29 DIAGNOSIS — I110 Hypertensive heart disease with heart failure: Secondary | ICD-10-CM | POA: Diagnosis not present

## 2022-03-29 DIAGNOSIS — Z9981 Dependence on supplemental oxygen: Secondary | ICD-10-CM | POA: Diagnosis not present

## 2022-03-29 DIAGNOSIS — I2510 Atherosclerotic heart disease of native coronary artery without angina pectoris: Secondary | ICD-10-CM | POA: Diagnosis not present

## 2022-03-29 DIAGNOSIS — J9622 Acute and chronic respiratory failure with hypercapnia: Secondary | ICD-10-CM | POA: Diagnosis not present

## 2022-03-29 DIAGNOSIS — E1151 Type 2 diabetes mellitus with diabetic peripheral angiopathy without gangrene: Secondary | ICD-10-CM | POA: Diagnosis not present

## 2022-03-29 DIAGNOSIS — K922 Gastrointestinal hemorrhage, unspecified: Secondary | ICD-10-CM | POA: Diagnosis not present

## 2022-03-29 DIAGNOSIS — J449 Chronic obstructive pulmonary disease, unspecified: Secondary | ICD-10-CM | POA: Diagnosis not present

## 2022-03-29 DIAGNOSIS — D500 Iron deficiency anemia secondary to blood loss (chronic): Secondary | ICD-10-CM | POA: Diagnosis not present

## 2022-03-29 DIAGNOSIS — F17210 Nicotine dependence, cigarettes, uncomplicated: Secondary | ICD-10-CM | POA: Diagnosis not present

## 2022-03-29 DIAGNOSIS — I5033 Acute on chronic diastolic (congestive) heart failure: Secondary | ICD-10-CM | POA: Diagnosis not present

## 2022-03-29 DIAGNOSIS — Z7984 Long term (current) use of oral hypoglycemic drugs: Secondary | ICD-10-CM | POA: Diagnosis not present

## 2022-03-30 DIAGNOSIS — J441 Chronic obstructive pulmonary disease with (acute) exacerbation: Secondary | ICD-10-CM | POA: Diagnosis not present

## 2022-04-02 ENCOUNTER — Ambulatory Visit: Payer: Medicare Other

## 2022-04-02 DIAGNOSIS — E1151 Type 2 diabetes mellitus with diabetic peripheral angiopathy without gangrene: Secondary | ICD-10-CM

## 2022-04-02 DIAGNOSIS — E782 Mixed hyperlipidemia: Secondary | ICD-10-CM

## 2022-04-02 DIAGNOSIS — R072 Precordial pain: Secondary | ICD-10-CM | POA: Diagnosis not present

## 2022-04-02 DIAGNOSIS — I2510 Atherosclerotic heart disease of native coronary artery without angina pectoris: Secondary | ICD-10-CM | POA: Diagnosis not present

## 2022-04-02 DIAGNOSIS — E119 Type 2 diabetes mellitus without complications: Secondary | ICD-10-CM | POA: Diagnosis not present

## 2022-04-04 ENCOUNTER — Ambulatory Visit: Payer: Self-pay

## 2022-04-04 NOTE — Patient Outreach (Signed)
  Care Coordination   Follow Up Visit Note   04/04/2022 Name: [name] MRN: [id number] DOB: 11/30/1950  [name] is a 71 y.o. year old female who sees Osei-Bonsu, Iona Beard, MD for primary care. I spoke with  [name] by phone today.  What matters to the patients health and wellness today?  Hospital admisstion 02/25/22-03/01/22. [name] reports that she had a stress test with Dr. Einar Gip, cardiologist on 04/02/22. She reports she has followed up with pulmonary provider on 03/06/22. She is being monitored remotely and sends blood pressure and weights daily. She denies any questions or concern at this time.    Goals Addressed             This Visit's Progress    HF management       Care Coordination Interventions: Reviewed signs/symptoms of HF exacerbation and COPD exacerbation and encouraged patient to contact provider if any questions, concerns or worsening condition Encouraged to continue to take medications as prescribed Reinforced importance of limiting salt to <2 gm/day, limiting fluid intake to 1.5L per day(per recommendation noted in chart).  Discussed upcoming Holidays and encouraged to continue to eat Healthy Provided positive feedback regarding self management of health      SDOH assessments and interventions completed:  No  Care Coordination Interventions Activated:  Yes  Care Coordination Interventions:  Yes, provided   Follow up plan: Follow up call scheduled for 05/09/22    Encounter Outcome:  Pt. Visit Completed   Thea Silversmith, RN, MSN, BSN, [place] Coordinator [phone]

## 2022-04-04 NOTE — Patient Instructions (Signed)
Visit Information  Thank you for taking time to visit with me today. Please don't hesitate to contact me if I can be of assistance to you.   Following are the goals we discussed today:   Goals Addressed             This Visit's Progress    HF management       Care Coordination Interventions: Reviewed signs/symptoms of HF exacerbation and COPD exacerbation and encouraged patient to contact provider if any questions, concerns or worsening condition Encouraged to continue to take medications as prescribed Reinforced importance of limiting salt to <2 gm/day, limiting fluid intake to 1.5L per day(per recommendation noted in chart).  Discussed upcoming Holidays and encouraged to continue to eat Healthy Provided positive feedback regarding self management of health      Our next appointment is by telephone on 05/09/22 at 10:30 am  Please call the care guide team at [phone] if you need to cancel or reschedule your appointment.   If you are experiencing a Mental Health or [place] or need someone to talk to, please call the Suicide and Crisis Lifeline: 988  Patient verbalizes understanding of instructions and care plan provided today and agrees to view in [place]. Active MyChart status and patient understanding of how to access instructions and care plan via MyChart confirmed with patient.     Thea Silversmith, RN, MSN, BSN, [place] Coordinator [phone]

## 2022-04-05 DIAGNOSIS — I5033 Acute on chronic diastolic (congestive) heart failure: Secondary | ICD-10-CM | POA: Diagnosis not present

## 2022-04-05 DIAGNOSIS — Z9981 Dependence on supplemental oxygen: Secondary | ICD-10-CM | POA: Diagnosis not present

## 2022-04-05 DIAGNOSIS — E1151 Type 2 diabetes mellitus with diabetic peripheral angiopathy without gangrene: Secondary | ICD-10-CM | POA: Diagnosis not present

## 2022-04-05 DIAGNOSIS — J9622 Acute and chronic respiratory failure with hypercapnia: Secondary | ICD-10-CM | POA: Diagnosis not present

## 2022-04-05 DIAGNOSIS — K922 Gastrointestinal hemorrhage, unspecified: Secondary | ICD-10-CM | POA: Diagnosis not present

## 2022-04-05 DIAGNOSIS — F17210 Nicotine dependence, cigarettes, uncomplicated: Secondary | ICD-10-CM | POA: Diagnosis not present

## 2022-04-05 DIAGNOSIS — I2510 Atherosclerotic heart disease of native coronary artery without angina pectoris: Secondary | ICD-10-CM | POA: Diagnosis not present

## 2022-04-05 DIAGNOSIS — J449 Chronic obstructive pulmonary disease, unspecified: Secondary | ICD-10-CM | POA: Diagnosis not present

## 2022-04-05 DIAGNOSIS — J9621 Acute and chronic respiratory failure with hypoxia: Secondary | ICD-10-CM | POA: Diagnosis not present

## 2022-04-05 DIAGNOSIS — I110 Hypertensive heart disease with heart failure: Secondary | ICD-10-CM | POA: Diagnosis not present

## 2022-04-05 DIAGNOSIS — Z7984 Long term (current) use of oral hypoglycemic drugs: Secondary | ICD-10-CM | POA: Diagnosis not present

## 2022-04-05 DIAGNOSIS — D500 Iron deficiency anemia secondary to blood loss (chronic): Secondary | ICD-10-CM | POA: Diagnosis not present

## 2022-04-07 DIAGNOSIS — J441 Chronic obstructive pulmonary disease with (acute) exacerbation: Secondary | ICD-10-CM | POA: Diagnosis not present

## 2022-04-08 NOTE — Progress Notes (Signed)
Called and spoke with patient regarding her recent stress test results.

## 2022-04-12 DIAGNOSIS — D75839 Thrombocytosis, unspecified: Secondary | ICD-10-CM | POA: Diagnosis not present

## 2022-04-12 DIAGNOSIS — K5521 Angiodysplasia of colon with hemorrhage: Secondary | ICD-10-CM | POA: Diagnosis not present

## 2022-04-12 DIAGNOSIS — I2510 Atherosclerotic heart disease of native coronary artery without angina pectoris: Secondary | ICD-10-CM | POA: Diagnosis not present

## 2022-04-12 DIAGNOSIS — D500 Iron deficiency anemia secondary to blood loss (chronic): Secondary | ICD-10-CM | POA: Diagnosis not present

## 2022-04-12 DIAGNOSIS — I5032 Chronic diastolic (congestive) heart failure: Secondary | ICD-10-CM | POA: Diagnosis not present

## 2022-04-12 DIAGNOSIS — J449 Chronic obstructive pulmonary disease, unspecified: Secondary | ICD-10-CM | POA: Diagnosis not present

## 2022-04-12 DIAGNOSIS — J9611 Chronic respiratory failure with hypoxia: Secondary | ICD-10-CM | POA: Diagnosis not present

## 2022-04-12 DIAGNOSIS — K219 Gastro-esophageal reflux disease without esophagitis: Secondary | ICD-10-CM | POA: Diagnosis not present

## 2022-04-12 DIAGNOSIS — I10 Essential (primary) hypertension: Secondary | ICD-10-CM | POA: Diagnosis not present

## 2022-04-12 DIAGNOSIS — E119 Type 2 diabetes mellitus without complications: Secondary | ICD-10-CM | POA: Diagnosis not present

## 2022-04-12 DIAGNOSIS — I739 Peripheral vascular disease, unspecified: Secondary | ICD-10-CM | POA: Diagnosis not present

## 2022-04-15 DIAGNOSIS — I10 Essential (primary) hypertension: Secondary | ICD-10-CM | POA: Diagnosis not present

## 2022-04-15 DIAGNOSIS — I209 Angina pectoris, unspecified: Secondary | ICD-10-CM | POA: Diagnosis not present

## 2022-04-15 DIAGNOSIS — Z1159 Encounter for screening for other viral diseases: Secondary | ICD-10-CM | POA: Diagnosis not present

## 2022-04-15 DIAGNOSIS — E1151 Type 2 diabetes mellitus with diabetic peripheral angiopathy without gangrene: Secondary | ICD-10-CM | POA: Diagnosis not present

## 2022-04-15 DIAGNOSIS — D500 Iron deficiency anemia secondary to blood loss (chronic): Secondary | ICD-10-CM | POA: Diagnosis not present

## 2022-04-15 DIAGNOSIS — I2510 Atherosclerotic heart disease of native coronary artery without angina pectoris: Secondary | ICD-10-CM | POA: Diagnosis not present

## 2022-04-15 DIAGNOSIS — Z0001 Encounter for general adult medical examination with abnormal findings: Secondary | ICD-10-CM | POA: Diagnosis not present

## 2022-04-15 DIAGNOSIS — D75839 Thrombocytosis, unspecified: Secondary | ICD-10-CM | POA: Diagnosis not present

## 2022-04-15 DIAGNOSIS — Z23 Encounter for immunization: Secondary | ICD-10-CM | POA: Diagnosis not present

## 2022-04-15 DIAGNOSIS — K5521 Angiodysplasia of colon with hemorrhage: Secondary | ICD-10-CM | POA: Diagnosis not present

## 2022-04-15 DIAGNOSIS — I110 Hypertensive heart disease with heart failure: Secondary | ICD-10-CM | POA: Diagnosis not present

## 2022-04-17 ENCOUNTER — Ambulatory Visit: Payer: Self-pay | Admitting: Licensed Clinical Social Worker

## 2022-04-17 NOTE — Patient Instructions (Signed)
Visit Information  Thank you for taking time to visit with me today. Please don't hesitate to contact me if I can be of assistance to you before our next scheduled telephone appointment.  Following are the goals we discussed today:   Our next appointment is by telephone on 05/06/22 at 10:30 AM  Please call the care guide team at [phone] if you need to cancel or reschedule your appointment.   If you are experiencing a Mental Health or [place] or need someone to talk to, please go to [facility] [place] [phone])   Following is a copy of your full plan of care:   Care Coordination Interventions:  LCSW called client phone number 2 times today but was unable to speak via phone with client. LCSW left phone message for client asking client to return call to LCSW at [phone].  [name] was given information about Care Management services by the embedded care coordination team including:  Care Management services include personalized support from designated clinical staff supervised by her physician, including individualized plan of care and coordination with other care providers 24/7 contact phone numbers for assistance for urgent and routine care needs. The patient may stop CCM services at any time (effective at the end of the month) by phone call to the office staff.  Patient agreed to services and verbal consent obtained.   [name] MSW, [place] representative [facility] Care Management [phone]

## 2022-04-17 NOTE — Patient Outreach (Signed)
  Care Coordination   04/17/2022 Name: [name] MRN: [id number] DOB: [birth year]   Care Coordination Outreach Attempts:  An unsuccessful telephone outreach was attempted today to offer the patient information about available care coordination services as a benefit of their health plan.   Follow Up Plan:  Additional outreach attempts will be made to offer the patient care coordination information and services.   Encounter Outcome:  No Answer  Care Coordination Interventions Activated:  Yes   Care Coordination Interventions:  Yes, provided    [name].Sylvan Lahm MSW, [place] Holiday representative [facility] Care Management [phone]

## 2022-04-18 DIAGNOSIS — J9621 Acute and chronic respiratory failure with hypoxia: Secondary | ICD-10-CM | POA: Diagnosis not present

## 2022-04-18 DIAGNOSIS — D500 Iron deficiency anemia secondary to blood loss (chronic): Secondary | ICD-10-CM | POA: Diagnosis not present

## 2022-04-18 DIAGNOSIS — J9622 Acute and chronic respiratory failure with hypercapnia: Secondary | ICD-10-CM | POA: Diagnosis not present

## 2022-04-18 DIAGNOSIS — I5033 Acute on chronic diastolic (congestive) heart failure: Secondary | ICD-10-CM | POA: Diagnosis not present

## 2022-04-18 DIAGNOSIS — F17210 Nicotine dependence, cigarettes, uncomplicated: Secondary | ICD-10-CM | POA: Diagnosis not present

## 2022-04-18 DIAGNOSIS — Z9981 Dependence on supplemental oxygen: Secondary | ICD-10-CM | POA: Diagnosis not present

## 2022-04-18 DIAGNOSIS — E1151 Type 2 diabetes mellitus with diabetic peripheral angiopathy without gangrene: Secondary | ICD-10-CM | POA: Diagnosis not present

## 2022-04-18 DIAGNOSIS — Z7984 Long term (current) use of oral hypoglycemic drugs: Secondary | ICD-10-CM | POA: Diagnosis not present

## 2022-04-18 DIAGNOSIS — I110 Hypertensive heart disease with heart failure: Secondary | ICD-10-CM | POA: Diagnosis not present

## 2022-04-18 DIAGNOSIS — J449 Chronic obstructive pulmonary disease, unspecified: Secondary | ICD-10-CM | POA: Diagnosis not present

## 2022-04-18 DIAGNOSIS — I2510 Atherosclerotic heart disease of native coronary artery without angina pectoris: Secondary | ICD-10-CM | POA: Diagnosis not present

## 2022-04-18 DIAGNOSIS — K922 Gastrointestinal hemorrhage, unspecified: Secondary | ICD-10-CM | POA: Diagnosis not present

## 2022-04-23 DIAGNOSIS — I10 Essential (primary) hypertension: Secondary | ICD-10-CM | POA: Diagnosis not present

## 2022-04-23 DIAGNOSIS — K219 Gastro-esophageal reflux disease without esophagitis: Secondary | ICD-10-CM | POA: Diagnosis not present

## 2022-04-29 ENCOUNTER — Other Ambulatory Visit: Payer: Self-pay | Admitting: Family Medicine

## 2022-04-29 DIAGNOSIS — E2839 Other primary ovarian failure: Secondary | ICD-10-CM

## 2022-05-21 ENCOUNTER — Telehealth: Payer: Self-pay | Admitting: *Deleted

## 2022-05-21 NOTE — Progress Notes (Signed)
  Care Coordination Note  05/21/2022 Name: Reta Norgren MRN: [id number] DOB: Jul 04, 1950  Tera Partridge Tweedy is a 71 y.o. year old female who is a primary care patient of Osei-Bonsu, Iona Beard, MD and is actively engaged with the care management team. I reached out to [employer] by phone today to assist with re-scheduling a follow up visit with the RN Case Manager  Follow up plan: Unsuccessful telephone outreach attempt made. A HIPAA compliant phone message was left for the patient providing contact information and requesting a return call.   Julian Hy, [place] Direct Dial: [phone]

## 2022-05-28 ENCOUNTER — Telehealth: Payer: Self-pay | Admitting: Pharmacist

## 2022-05-28 ENCOUNTER — Encounter: Payer: Self-pay | Admitting: Acute Care

## 2022-05-28 ENCOUNTER — Ambulatory Visit (INDEPENDENT_AMBULATORY_CARE_PROVIDER_SITE_OTHER): Payer: Medicare Other | Admitting: Acute Care

## 2022-05-28 VITALS — BP 118/70 | HR 61 | Temp 98.0°F | Ht 59.0 in | Wt 198.8 lb

## 2022-05-28 DIAGNOSIS — Z87891 Personal history of nicotine dependence: Secondary | ICD-10-CM

## 2022-05-28 DIAGNOSIS — J449 Chronic obstructive pulmonary disease, unspecified: Secondary | ICD-10-CM

## 2022-05-28 NOTE — Patient Instructions (Addendum)
It is good to see you today.  Continue lasix , Aldactone, losartan as you have been doing Weigh daily, and call cardiology if you have weight gain  or increase in the swelling in your legs. Low salt diet.  Follow up with cardiology >> Echo scheduled 08/05/2022 Consider Cardio-Pulmonary Rehab or [place] [facility] Continue albuterol as needed for shortness of breath or wheezing 2 puffs as needed, do not exceed 4 times a day, if you need the medication this frequently, you need to call to be seen.  Wear oxygen at 2 L [place] with exertion. Monitor saturations , sat goal is > 92% at all times.  Please place an order for evaluation to [facility] for POC and home oxygen.  If your oxygen level drops below this number, you must wear your oxygen.  We will refer you to Lung Cancer Screening . You will get a call to get this scheduled. Please contact office for sooner follow up if symptoms do not improve or worsen or seek emergency care

## 2022-05-28 NOTE — Progress Notes (Signed)
[place] Team  Medication Adherence Quality Measures  05/28/22  [name] 06/03/1951  Provider:  Dr. Vista Lawman  Practice:  [facility]   Medication Adherence Measure(s):   Medication Adherence for Cholesterol Medication  Medication Adherence Findings:  Placed telephonic outreach to patient, she confirms that she is in need of a refill for Atorvastatin 80 mg. Permission granted to coordinate a refill through [place] with patient today. Patient is also waiting for a refill request to be approved for ezetimibe 10 mg. Will contact office on behalf of patient.  Patient appreciative of call.   Loretha Brasil, PharmD [place] Pharmacist Office: [phone]

## 2022-05-28 NOTE — Progress Notes (Signed)
History of Present Illness [name] is a 71 y.o. female former smoker ( Quit 2022 with a  27 pack year smoking history) with recent hospital admission due to streptococcus pneumonia bacteremia and community-acquired pneumonia, and heart failure, all Complicated by baseline pulmonary restriction  on 2L home oxygen.  She is followed by Dr. Valeta Harms.   Synopsis Admission to [facility] with  9/4-9/8 2023 with shortness of breath and lower extremity edema,  diagnosis of acute on chronic heart failure.>> Treated with diuretics, Aldactone, losartan discharged on oral Lasix. She was requiring oxygen at time of discharge. Discharged on same inhaler regimen with Trelegy Ellipta.  Seen by Dr. Valeta Harms 03/06/2022. Not wearing her oxygen and saturations on arrival 80%.She had stopped taking her inhaler as she felt it did not help her breathing. Dr. Valeta Harms did extensive education regarding heart failure and COPD Plan was to follow up with Cardiology( she had a visit with Dr. Tamsen Snider) continue her diuretics, wear her oxygen,  and she was encouraged her to watch her lower extremity edema and watch her weight daily, and call cardiology for increase in her weight or edema.      05/28/2022 Pt. Presents for follow up. She states she has been doing well. She has been compliant with her medications and she has noted she has been less short of breath. She is weighing herself daily. She has lost weight from 120 to 196 lbs. She is wearing her oxygen at 3 L [place].Sats are 99%. Her lower extremity edema is much better. She is feeling well enough now that she would like to be more active and she would like an [employer] so she can walk, and ride her bike. We will request [employer] supply her oxygen and POC. She states she has not been smoking, but her clothes do smell of smoke. She states her daughter does smoke around her.   Test Results: PFT's 03/06/2022               CARDIAC DATABASE: EKG: 08/14/2021: NSR, 68 bpm, normal  axis, PACs, without underlying injury pattern. 09/10/2021: NSR, 94bpm, occasional PVCs.  03/14/2022: Sinus bradycardia, 55 bpm, normal axis, without underlying ischemia or injury pattern.   Echocardiogram: 11/08/2021:  1. Left ventricular ejection fraction, by estimation, is 55 to 60%. The left ventricle has normal function. The left ventricle has no regional wall motion abnormalities. Left ventricular diastolic parameters are  consistent with Grade I diastolic  dysfunction (impaired relaxation).   2. Right ventricular systolic function is moderately reduced. The right ventricular size is moderately enlarged. There is severely elevated pulmonary artery systolic pressure.   3. Right atrial size was moderately dilated.   4. The mitral valve is abnormal. Trivial mitral valve regurgitation. No evidence of mitral stenosis.   5. Tricuspid valve regurgitation is mild to moderate.   6. Gradients stable to slightly lower compared to TTE done 08/29/21. The aortic valve is tricuspid. There is moderate calcification of the aortic valve. There is moderate thickening of the aortic valve. Aortic valve  regurgitation is not visualized. Mild  aortic valve stenosis.   7. Aortic dilatation noted. There is dilatation of the ascending aorta, measuring 43 mm.   8. The inferior vena cava is dilated in size with >50% respiratory variability, suggesting right atrial pressure of 8 mmHg   Stress Testing:  Exercise myoview stress 05/19/2017: 1. Patient achieved 4.4 METS and reached 83% target heart rate. Exercise capacity low for age. Appropriate heart rate and blood pressure response.  Stress symptoms included dyspnea, dizziness. Submaximal exercise stress study. 2. No stress EKG changes suggestive of ischemia. 3. Stress and rest SPECT images demonstrate homogeneous tracer distribution throughout the myocardium. Gated SPECT imaging reveals normal myocardial thickening and wall motion. The left ventricular ejection fraction was  normal (65%).   4. This is a low risk submaximal exercise stress study   Heart Catheterization: Coronary angiogram 02/27/2014: Mid RCA 60-70% stenosis, mid LAD 60-70% stenosis. FFR to the LAD, lesion insignificant. Medical therapy for CAD. Moderate aortic valve calcification.   Carotid artery duplex  01/30/2021:  Duplex suggests stenosis in the right internal carotid artery (50-69%).  Duplex suggests stenosis in the right external carotid artery (<50%).  Duplex suggests stenosis in the left internal carotid artery (50-69%).  Duplex suggests stenosis in the left external carotid artery (<50%).  Antegrade right vertebral artery flow. Antegrade left vertebral artery flow.  Follow up in six months is appropriate if clinically indicated.      Peripheral arteriogram 02/11/2017: Successful PTA with DCB 5x150x2 InPact Admiral in the right proximal to distal SFA and stenting of proximal SFA with DES, Zilver 6x100 mm stent. 3 vessel r/o. Left mild disease and 2 vessel R/O. Mild disease left by angiogram 10/05/2013.    VAS US Aorta / IVC/Iliacs 08/17/2021 Calcifications noted throughout the abdominal aorta. There is no evidence of significant stenosis in the aortoiliac arteries, however, this is based on limited visualization.   ABI  08/17/2021 Right: Resting right ankle-brachial index is within normal range. No evidence of significant right lower extremity arterial disease. The right toe-brachial index is normal.   Left: Resting left ankle-brachial index is within normal range. No evidence of significant left lower extremity arterial disease. The left toe-brachial index is abnormal.   Lower extremity arterial duplex: 08/17/2021 Right: 50-74% stenosis noted in the deep femoral artery. Patent stent with no evidence of stenosis in the superficial femoral artery.       Latest Ref Rng & Units 02/26/2022    4:02 AM 02/25/2022    9:45 PM 12/03/2021    1:34 AM  CBC  WBC 4.0 - 10.5 K/uL 7.6  7.2  13.3    Hemoglobin 12.0 - 15.0 g/dL 9.1  8.8  7.6   Hematocrit 36.0 - 46.0 % 33.2  32.2  28.2   Platelets 150 - 400 K/uL 505  476  537        Latest Ref Rng & Units 03/01/2022    4:26 AM 02/28/2022    4:52 AM 02/27/2022    4:27 AM  BMP  Glucose 70 - 99 mg/dL 112  106  103   BUN 8 - 23 mg/dL 17  15  12    Creatinine 0.44 - 1.00 mg/dL 1.00  0.78  0.71   Sodium 135 - 145 mmol/L 142  142  141   Potassium 3.5 - 5.1 mmol/L 4.0  3.3  3.7   Chloride 98 - 111 mmol/L 99  100  102   CO2 22 - 32 mmol/L 37  34  33   Calcium 8.9 - 10.3 mg/dL 9.1  8.8  8.6     BNP    Component Value Date/Time   BNP 178.7 (H) 02/25/2022 2145    ProBNP    Component Value Date/Time   PROBNP 8.1 01/10/2011 1344    PFT    Component Value Date/Time   FEV1PRE 1.15 03/06/2022 0942   FEV1POST 1.12 03/06/2022 0942   FVCPRE 1.26 03/06/2022 0942   FVCPOST 1.20  03/06/2022 0942   TLC 3.03 03/06/2022 0942   DLCOUNC 7.98 03/06/2022 0942   PREFEV1FVCRT 91 03/06/2022 0942   PSTFEV1FVCRT 93 03/06/2022 0942    No results found.   Past medical hx Past Medical History:  Diagnosis Date   Anemia 12/26/2015   Anxiety    Arthritis    Blood transfusion without reported diagnosis    Chronic pain    resolved per pt 04/12/20   COPD (chronic obstructive pulmonary disease) (HCC)    Coronary artery disease    Depression    Diabetes mellitus without complication (HCC)    type 2   GERD (gastroesophageal reflux disease)    Heart murmur    since birth; Echo 02/18/19: LVEF 21%, grade 1 diastolic dysfunction, mild AS (mean grad 12 mmHg), trace MR/PR, mild TR, PASP 32 mmHg     Hyperlipemia    Hypertension    PVD (peripheral vascular disease) ([place])    right SFA stent 02/11/17 by Dr. Einar Gip   Sleep apnea    does not use cpap   Wears glasses      Social History   Tobacco Use   Smoking status: Former    Packs/day: 0.50    Years: 54.00    Total pack years: 27.00    Types: Cigarettes    Start date: 08/18/1966    Quit date:  05/25/2021    Years since quitting: 1.0   Smokeless tobacco: Never  Vaping Use   Vaping Use: Never used  Substance Use Topics   Alcohol use: Yes    Comment: occasional   Drug use: Yes    Types: Marijuana, Cocaine    Comment: Last use 04/11/20    Ms.Clermont reports that she quit smoking about a year ago. Her smoking use included cigarettes. She started smoking about 55 years ago. She has a 27.00 pack-year smoking history. She has never used smokeless tobacco. She reports current alcohol use. She reports current drug use. Drugs: Marijuana and Cocaine.  Tobacco Cessation: Former smoker , Quit 2022 with a 27 pack year smoking history   Past surgical hx, Family hx, Social hx all reviewed.  Current Outpatient Medications on File Prior to Visit  Medication Sig   acetaminophen (TYLENOL) 500 MG tablet Take 500 mg by mouth every 6 (six) hours as needed.   albuterol (PROVENTIL) (2.5 MG/3ML) 0.083% nebulizer solution Take 2.5 mg by nebulization every 6 (six) hours as needed for wheezing or shortness of breath.   albuterol (VENTOLIN HFA) 108 (90 Base) MCG/ACT inhaler Inhale 1-2 puffs into the lungs every 6 (six) hours as needed for wheezing or shortness of breath. (Patient taking differently: Inhale 2 puffs into the lungs every 6 (six) hours as needed for wheezing or shortness of breath.)   ASPIRIN LOW DOSE 81 MG EC tablet TAKE 1 TABLET(81 MG) BY MOUTH DAILY (Patient taking differently: Take 81 mg by mouth daily.)   atorvastatin (LIPITOR) 80 MG tablet Take 80 mg by mouth daily.   Cholecalciferol (VITAMIN D-3 PO) Take 1 capsule by mouth 2 (two) times daily.   cilostazol (PLETAL) 50 MG tablet Take 50 mg by mouth 2 (two) times daily.   ezetimibe (ZETIA) 10 MG tablet TAKE 1 TABLET BY MOUTH DAILY (Patient taking differently: Take 10 mg by mouth daily.)   ferrous sulfate 325 (65 FE) MG tablet Take 325 mg by mouth daily.   fluticasone (FLONASE) 50 MCG/ACT nasal spray Place 1 spray into both nostrils daily  as needed for allergies.   Fluticasone-Umeclidin-Vilant (  TRELEGY ELLIPTA) 100-62.5-25 MCG/ACT AEPB Inhale 1 puff into the lungs daily.   furosemide (LASIX) 40 MG tablet Take 1 tablet (40 mg total) by mouth 2 (two) times daily.   gabapentin (NEURONTIN) 600 MG tablet Take 1 tablet (600 mg total) by mouth at bedtime.   ipratropium-albuterol (DUONEB) 0.5-2.5 (3) MG/3ML SOLN Take 3 mLs by nebulization 3 (three) times daily. (Patient taking differently: Take 3 mLs by nebulization 3 (three) times daily as needed (wheezing).)   LINZESS 145 MCG CAPS capsule Take 145 mcg by mouth daily as needed (Constipation). (Patient not taking: Reported on 04/17/2022)   losartan (COZAAR) 25 MG tablet Take 1 tablet (25 mg total) by mouth daily. (Patient not taking: Reported on 03/21/2022)   metFORMIN (GLUCOPHAGE) 500 MG tablet Take 0.5 tablets (250 mg total) by mouth 2 (two) times daily with a meal.   metoprolol tartrate (LOPRESSOR) 25 MG tablet Take 1 tablet (25 mg total) by mouth 2 (two) times daily.   MYRBETRIQ 25 MG TB24 tablet Take 25 mg by mouth daily. (Patient not taking: Reported on 04/17/2022)   nitroGLYCERIN (NITROSTAT) 0.4 MG SL tablet Place 1 tablet (0.4 mg total) under the tongue every 5 (five) minutes as needed for chest pain.   OXYGEN Inhale 3 L into the lungs continuous.   pantoprazole (PROTONIX) 40 MG tablet Take 1 tablet (40 mg total) by mouth 2 (two) times daily. (Patient taking differently: Take 40 mg by mouth daily.)   potassium chloride SA (KLOR-CON M) 20 MEQ tablet Take 1 tablet (20 mEq total) by mouth daily.   sacubitril-valsartan (ENTRESTO) 24-26 MG Take 1 tablet by mouth 2 (two) times daily.   spironolactone (ALDACTONE) 50 MG tablet TAKE 1 TABLET BY MOUTH DAILY (Patient taking differently: Take 50 mg by mouth daily.)   No current facility-administered medications on file prior to visit.     No Known Allergies  Review Of Systems:  Constitutional:   No  weight loss, night sweats,  Fevers,  chills, fatigue, or  lassitude.  HEENT:   No headaches,  Difficulty swallowing,  Tooth/dental problems, or  Sore throat,                No sneezing, itching, ear ache, nasal congestion, post nasal drip,   CV:  No chest pain,  Orthopnea, PND, swelling in lower extremities, anasarca, dizziness, palpitations, syncope.   GI  No heartburn, indigestion, abdominal pain, nausea, vomiting, diarrhea, change in bowel habits, loss of appetite, bloody stools.   Resp: No shortness of breath with exertion or at rest.  No excess mucus, no productive cough,  No non-productive cough,  No coughing up of blood.  No change in color of mucus.  No wheezing.  No chest wall deformity  Skin: no rash or lesions.  GU: no dysuria, change in color of urine, no urgency or frequency.  No flank pain, no hematuria   MS:  No joint pain or swelling.  No decreased range of motion.  No back pain.  Psych:  No change in mood or affect. No depression or anxiety.  No memory loss.   Vital Signs LMP  (LMP Unknown)    Physical Exam:  General- No distress,  A&Ox3 ENT: No sinus tenderness, TM clear, pale nasal mucosa, no oral exudate,no post nasal drip, no LAN Cardiac: S1, S2, regular rate and rhythm, no murmur Chest: No wheeze/ rales/ dullness; no accessory muscle use, no nasal flaring, no sternal retractions Abd.: Soft Non-tender Ext: No clubbing cyanosis, edema Neuro:  normal strength Skin: No rashes, warm and dry Psych: normal mood and behavior   Assessment/Plan  Heart Failure Plan Continue diuretics, Aldactone, losartan as you have been doing Weigh daily, and call cardiology if you have weight gain  or increase in the swelling in your legs. Low salt diet.  Follow up with cardiology >> Echo scheduled 08/05/2022 Consider Cardio-Pulmonary Rehab [facility]  Dyspnea / Pulmonary restriction on PFT Plan Continue albuterol as needed for shortness of breath or wheezing 2 puffs as needed, do not exceed 4  times a day, if you need the medication this frequently, you need to call to be seen.  Wear oxygen at 2 L [place] with exertion. Monitor saturations , sat goal is > 92% at all times.  If your oxygen level drops below this number, you must wear your oxygen.   Obesity 95.3 KG Plan Continue working on weight loss.    I spent 40  minutes dedicated to the care of this patient on the date of this encounter to include pre-visit review of records, face-to-face time with the patient discussing conditions above, post visit ordering of testing, clinical documentation with the electronic health record, making appropriate referrals as documented, and communicating necessary information to the patient's healthcare team.    Magdalen Spatz, NP 05/28/2022  10:39 AM

## 2022-05-29 ENCOUNTER — Other Ambulatory Visit: Payer: Self-pay | Admitting: *Deleted

## 2022-05-29 DIAGNOSIS — Z87891 Personal history of nicotine dependence: Secondary | ICD-10-CM

## 2022-05-29 DIAGNOSIS — Z122 Encounter for screening for malignant neoplasm of respiratory organs: Secondary | ICD-10-CM

## 2022-05-30 ENCOUNTER — Telehealth: Payer: Self-pay | Admitting: Acute Care

## 2022-05-30 NOTE — Telephone Encounter (Signed)
Patient has never had a 6 minute walk. Patient has had full PFTs and looks like no oxygen is in use. Nothing further needed

## 2022-06-18 ENCOUNTER — Other Ambulatory Visit: Payer: Self-pay

## 2022-06-18 DIAGNOSIS — K5521 Angiodysplasia of colon with hemorrhage: Secondary | ICD-10-CM

## 2022-06-20 ENCOUNTER — Inpatient Hospital Stay: Payer: Medicare Other

## 2022-06-20 ENCOUNTER — Other Ambulatory Visit: Payer: Self-pay | Admitting: Internal Medicine

## 2022-06-20 ENCOUNTER — Inpatient Hospital Stay: Payer: Medicare Other | Attending: Internal Medicine | Admitting: Internal Medicine

## 2022-06-20 ENCOUNTER — Other Ambulatory Visit: Payer: Self-pay

## 2022-06-20 VITALS — BP 109/69 | HR 70 | Temp 98.3°F | Resp 17 | Wt 225.4 lb

## 2022-06-20 DIAGNOSIS — Z87891 Personal history of nicotine dependence: Secondary | ICD-10-CM | POA: Diagnosis not present

## 2022-06-20 DIAGNOSIS — I10 Essential (primary) hypertension: Secondary | ICD-10-CM | POA: Insufficient documentation

## 2022-06-20 DIAGNOSIS — D500 Iron deficiency anemia secondary to blood loss (chronic): Secondary | ICD-10-CM

## 2022-06-20 DIAGNOSIS — E611 Iron deficiency: Secondary | ICD-10-CM | POA: Diagnosis not present

## 2022-06-20 DIAGNOSIS — E1151 Type 2 diabetes mellitus with diabetic peripheral angiopathy without gangrene: Secondary | ICD-10-CM | POA: Insufficient documentation

## 2022-06-20 DIAGNOSIS — Z79899 Other long term (current) drug therapy: Secondary | ICD-10-CM | POA: Insufficient documentation

## 2022-06-20 DIAGNOSIS — K5521 Angiodysplasia of colon with hemorrhage: Secondary | ICD-10-CM

## 2022-06-20 DIAGNOSIS — D539 Nutritional anemia, unspecified: Secondary | ICD-10-CM

## 2022-06-20 DIAGNOSIS — Z90710 Acquired absence of both cervix and uterus: Secondary | ICD-10-CM

## 2022-06-20 DIAGNOSIS — D649 Anemia, unspecified: Secondary | ICD-10-CM | POA: Insufficient documentation

## 2022-06-20 DIAGNOSIS — E119 Type 2 diabetes mellitus without complications: Secondary | ICD-10-CM | POA: Diagnosis not present

## 2022-06-20 LAB — CMP (CANCER CENTER ONLY)
ALT: 6 U/L (ref 0–44)
AST: 12 U/L — ABNORMAL LOW (ref 15–41)
Albumin: 4 g/dL (ref 3.5–5.0)
Alkaline Phosphatase: 72 U/L (ref 38–126)
Anion gap: 7 (ref 5–15)
BUN: 21 mg/dL (ref 8–23)
CO2: 32 mmol/L (ref 22–32)
Calcium: 10.1 mg/dL (ref 8.9–10.3)
Chloride: 101 mmol/L (ref 98–111)
Creatinine: 0.71 mg/dL (ref 0.44–1.00)
GFR, Estimated: >60 mL/min (ref >60)
Glucose, Bld: 123 mg/dL — ABNORMAL HIGH (ref 70–99)
Potassium: 3.5 mmol/L (ref 3.5–5.1)
Sodium: 140 mmol/L (ref 135–145)
Total Bilirubin: 0.3 mg/dL (ref 0.3–1.2)
Total Protein: 7.9 g/dL (ref 6.5–8.1)

## 2022-06-20 LAB — CBC WITH DIFFERENTIAL (CANCER CENTER ONLY)
Abs Immature Granulocytes: 0.02 10*3/uL (ref 0.00–0.07)
Basophils Absolute: 0 10*3/uL (ref 0.0–0.1)
Basophils Relative: 0 %
Eosinophils Absolute: 0.2 10*3/uL (ref 0.0–0.5)
Eosinophils Relative: 2 %
HCT: 29.9 % — ABNORMAL LOW (ref 36.0–46.0)
Hemoglobin: 9.4 g/dL — ABNORMAL LOW (ref 12.0–15.0)
Immature Granulocytes: 0 %
Lymphocytes Relative: 16 %
Lymphs Abs: 1.5 10*3/uL (ref 0.7–4.0)
MCH: 26.9 pg (ref 26.0–34.0)
MCHC: 31.4 g/dL (ref 30.0–36.0)
MCV: 85.4 fL (ref 80.0–100.0)
Monocytes Absolute: 1 10*3/uL (ref 0.1–1.0)
Monocytes Relative: 11 %
Neutro Abs: 6.4 10*3/uL (ref 1.7–7.7)
Neutrophils Relative %: 71 %
Platelet Count: 473 10*3/uL — ABNORMAL HIGH (ref 150–400)
RBC: 3.5 MIL/uL — ABNORMAL LOW (ref 3.87–5.11)
RDW: 16.1 % — ABNORMAL HIGH (ref 11.5–15.5)
WBC Count: 9.1 10*3/uL (ref 4.0–10.5)
nRBC: 0 % (ref 0.0–0.2)

## 2022-06-20 LAB — LACTATE DEHYDROGENASE: LDH: 142 U/L (ref 98–192)

## 2022-06-20 LAB — IRON AND IRON BINDING CAPACITY (CC-WL,HP ONLY)
Iron: 22 ug/dL — ABNORMAL LOW (ref 28–170)
Saturation Ratios: 5 % — ABNORMAL LOW (ref 10.4–31.8)
TIBC: 482 ug/dL — ABNORMAL HIGH (ref 250–450)
UIBC: 460 ug/dL — ABNORMAL HIGH (ref 148–442)

## 2022-06-20 LAB — FOLATE: Folate: 11.4 ng/mL (ref >5.9)

## 2022-06-20 LAB — VITAMIN B12: Vitamin B-12: 241 pg/mL (ref 180–914)

## 2022-06-20 LAB — TSH: TSH: 2.703 u[IU]/mL (ref 0.350–4.500)

## 2022-06-20 LAB — FERRITIN: Ferritin: 8 ng/mL — ABNORMAL LOW (ref 11–307)

## 2022-06-20 NOTE — Progress Notes (Signed)
[place] [phone]   [phone]  CONSULT NOTE  REFERRING PHYSICIAN: Cleta Alberts, MD  REASON FOR CONSULTATION:  71 years old African-American female with persistent anemia.  HPI [name] is a 71 y.o. female with past medical history significant for multiple medical problems including history of COPD, diabetes mellitus, hypertension, depression, coronary artery disease, GERD, dyslipidemia, peripheral vascular disease, sleep apnea, and anxiety as well as history of anemia.  I have seen the patient in 2019 for evaluation of a similar problem with persistent anemia.  That time her hemoglobin was down to 6.6.  She had a colonoscopy at that time by Dr. Benson Norway that was unremarkable.  She had extensive workup that were unremarkable except for the iron deficiency.  She received 2 units of PRBCs transfusion when seen on 02/14/2018.  She also received iron infusion with Feraheme for 2 doses the last 1 was given on 03/06/2022.  The patient started treatment with Integra +1 capsule p.o. daily.  She was supposed to follow-up with me in October 2019 but she was lost to follow-up since that time.  The patient switched primary care physician and currently seen by Dr. Cleta Alberts with [facility].  She was found to have significant anemia.  She is taken over the counter ferrous sulfate once daily with some vitamin B but no improvement in her anemia.  She has occasional dysphagia and she also noted some blood in her stool and she was referred back to Dr. Benson Norway for gastrointestinal evaluation.  She was also referred to me today for recommendation regarding her anemia. When seen today she continues to complain of fatigue and sleepy most of the time.  She also has occasional dizzy spells and shortness of breath with exertion and she is currently on home oxygen secondary to COPD and she is followed by her primary care physician and pulmonary medicine.  She denied  having any significant chest pain, cough or hemoptysis.  She has occasional nausea with no vomiting, diarrhea or constipation.  She has no headache or visual changes. Family history significant for father and mother with heart disease and hypertension. The patient is divorced and has 3 children she used to work in [employer].  She has a history of smoking for around 55 years she also has a history of alcohol and drug abuse but quit everything in November 2022 according to her. HPI  Past Medical History:  Diagnosis Date   Anemia 12/26/2015   Anxiety    Arthritis    Blood transfusion without reported diagnosis    Chronic pain    resolved per pt 04/12/20   COPD (chronic obstructive pulmonary disease) (HCC)    Coronary artery disease    Depression    Diabetes mellitus without complication (HCC)    type 2   GERD (gastroesophageal reflux disease)    Heart murmur    since birth; Echo 02/18/19: LVEF 93%, grade 1 diastolic dysfunction, mild AS (mean grad 12 mmHg), trace MR/PR, mild TR, PASP 32 mmHg     Hyperlipemia    Hypertension    PVD (peripheral vascular disease) ([place])    right SFA stent 02/11/17 by Dr. Einar Gip   Sleep apnea    does not use cpap   Wears glasses     Past Surgical History:  Procedure Laterality Date   ABDOMINAL HYSTERECTOMY     CARDIAC CATHETERIZATION     CATARACT EXTRACTION     CESAREAN SECTION  COLONOSCOPY N/A 01/08/2013   Procedure: COLONOSCOPY;  Surgeon: Beryle Beams, MD;  Location: [facility];  Service: Endoscopy;  Laterality: N/A;   COLONOSCOPY N/A 12/28/2015   Procedure: COLONOSCOPY;  Surgeon: Carol Ada, MD;  Location: [place];  Service: Endoscopy;  Laterality: N/A;   COLONOSCOPY WITH PROPOFOL N/A 10/05/2020   Procedure: COLONOSCOPY WITH PROPOFOL;  Surgeon: Carol Ada, MD;  [facility];  Service: Endoscopy;  Laterality: N/A;   ENTEROSCOPY N/A 12/28/2015   Procedure: ENTEROSCOPY;  Surgeon: Carol Ada, MD;  Location: [place];   Service: Endoscopy;  Laterality: N/A;   ENTEROSCOPY N/A 02/20/2018   Procedure: ENTEROSCOPY;  Surgeon: Carol Ada, MD;  [facility];  Service: Endoscopy;  Laterality: N/A;   ENTEROSCOPY N/A 03/27/2018   Procedure: ENTEROSCOPY;  Surgeon: Carol Ada, MD;  [facility];  Service: Endoscopy;  Laterality: N/A;   ENTEROSCOPY N/A 10/05/2020   Procedure: ENTEROSCOPY;  Surgeon: Carol Ada, MD;  [facility];  Service: Endoscopy;  Laterality: N/A;   ENTEROSCOPY N/A 05/11/2021   Procedure: ENTEROSCOPY;  Surgeon: Carol Ada, MD;  [facility];  Service: Endoscopy;  Laterality: N/A;   ENTEROSCOPY N/A 09/23/2021   Procedure: ENTEROSCOPY;  Surgeon: Doran Stabler, MD;  Location: [place];  Service: Gastroenterology;  Laterality: N/A;   ESOPHAGOGASTRODUODENOSCOPY N/A 01/08/2013   Procedure: ESOPHAGOGASTRODUODENOSCOPY (EGD);  Surgeon: Beryle Beams, MD;  Location: [facility];  Service: Endoscopy;  Laterality: N/A;   EYE SURGERY     GIVENS CAPSULE STUDY N/A 09/24/2021   Procedure: GIVENS CAPSULE STUDY;  Surgeon: Carol Ada, MD;  Location: [place];  Service: Gastroenterology;  Laterality: N/A;   HAND SURGERY     HEMOSTASIS CLIP PLACEMENT  05/11/2021   Procedure: HEMOSTASIS CLIP PLACEMENT;  Surgeon: Carol Ada, MD;  [facility];  Service: Endoscopy;;   HOT HEMOSTASIS N/A 12/28/2015   Procedure: HOT HEMOSTASIS (ARGON PLASMA COAGULATION/BICAP);  Surgeon: Carol Ada, MD;  Location: [facility] ENDOSCOPY;  Service: Endoscopy;  Laterality: N/A;   HOT HEMOSTASIS N/A 02/20/2018   Procedure: HOT HEMOSTASIS (ARGON PLASMA COAGULATION/BICAP);  Surgeon: Carol Ada, MD;  Location: [facility];  Service: Endoscopy;  Laterality: N/A;   HOT HEMOSTASIS N/A 03/27/2018   Procedure: HOT HEMOSTASIS (ARGON PLASMA COAGULATION/BICAP);  Surgeon: Carol Ada, MD;  Location: [facility];  Service: Endoscopy;  Laterality: N/A;   HOT HEMOSTASIS N/A 10/05/2020    Procedure: HOT HEMOSTASIS (ARGON PLASMA COAGULATION/BICAP);  Surgeon: Carol Ada, MD;  Location: [facility];  Service: Endoscopy;  Laterality: N/A;   HOT HEMOSTASIS N/A 05/11/2021   Procedure: HOT HEMOSTASIS (ARGON PLASMA COAGULATION/BICAP);  Surgeon: Carol Ada, MD;  Location: [facility];  Service: Endoscopy;  Laterality: N/A;   LEFT HEART CATHETERIZATION WITH CORONARY ANGIOGRAM N/A 03/09/2013   Procedure: LEFT HEART CATHETERIZATION WITH CORONARY ANGIOGRAM;  Surgeon: Laverda Page, MD;  Location: [facility] C[facility] LAB;  Service: Cardiovascular;  Laterality: N/A;   LOWER EXTREMITY ANGIOGRAM N/A 12/29/2012   Procedure: LOWER EXTREMITY ANGIOGRAM;  Surgeon: Laverda Page, MD;  Location: [facility] CATH LAB;  Service: Cardiovascular;  Laterality: N/A;   LOWER EXTREMITY ANGIOGRAM N/A 10/05/2013   Procedure: LOWER EXTREMITY ANGIOGRAM;  Surgeon: Laverda Page, MD;  Location: [facility] CATH LAB;  Service: Cardiovascular;  Laterality: N/A;   LOWER EXTREMITY ANGIOGRAPHY N/A 02/11/2017   Procedure: Lower Extremity Angiography;  Surgeon: Adrian Prows, MD;  Location: [place] CV LAB;  Service: Cardiovascular;  Laterality: N/A;   PERIPHERAL VASCULAR BALLOON ANGIOPLASTY  02/11/2017   Procedure: PERIPHERAL VASCULAR BALLOON ANGIOPLASTY;  Surgeon: Adrian Prows, MD;  Location: [place] CV LAB;  Service: Cardiovascular;;  Right SFA   PERIPHERAL VASCULAR INTERVENTION Right 02/11/2017   Procedure: PERIPHERAL VASCULAR INTERVENTION;  Surgeon: Adrian Prows, MD;  Location: [place] CV LAB;  Service: Cardiovascular;  Laterality: Right;  Rt SFA   POLYPECTOMY  10/05/2020   Procedure: POLYPECTOMY;  Surgeon: Carol Ada, MD;  Location: [facility];  Service: Endoscopy;;   stent in right leg  12/29/2012   for blood clot, Dr. Nadyne Coombes   TOTAL KNEE ARTHROPLASTY Left 04/18/2020   TOTAL KNEE ARTHROPLASTY Left 04/18/2020   Procedure: LEFT TOTAL KNEE ARTHROPLASTY;  Surgeon: Meredith Pel, MD;  Location: [place];  Service: Orthopedics;   Laterality: Left;   TYMPANOMASTOIDECTOMY Left 03/08/2014   Procedure: TYMPANOMASTOIDECTOMY LEFT;  Surgeon: Ascencion Dike, MD;  Location: [place];  Service: ENT;  Laterality: Left;   TYMPANOMASTOIDECTOMY  2015   UTERINE FIBROID SURGERY      Family History  Problem Relation Age of Onset   Diabetes Mother    Hypertension Mother    Heart disease Mother    Heart disease Father    Hypertension Father    Hypertension Sister    Hypertension Brother    Diabetes Brother    Hypertension Sister    Hypertension Brother    Diabetes Brother    Hypertension Brother    Hypertension Brother    Hypertension Brother     Social History Social History   Tobacco Use   Smoking status: Former    Packs/day: 0.50    Years: 54.00    Total pack years: 27.00    Types: Cigarettes    Start date: 08/18/1966    Quit date: 05/25/2021    Years since quitting: 1.0   Smokeless tobacco: Never  Vaping Use   Vaping Use: Never used  Substance Use Topics   Alcohol use: Yes    Comment: occasional   Drug use: Yes    Types: Marijuana, Cocaine    Comment: Last use 04/11/20    No Known Allergies  Current Outpatient Medications  Medication Sig Dispense Refill   acetaminophen (TYLENOL) 500 MG tablet Take 500 mg by mouth every 6 (six) hours as needed.     albuterol (PROVENTIL) (2.5 MG/3ML) 0.083% nebulizer solution Take 2.5 mg by nebulization every 6 (six) hours as needed for wheezing or shortness of breath.     albuterol (VENTOLIN HFA) 108 (90 Base) MCG/ACT inhaler Inhale 1-2 puffs into the lungs every 6 (six) hours as needed for wheezing or shortness of breath. (Patient taking differently: Inhale 2 puffs into the lungs every 6 (six) hours as needed for wheezing or shortness of breath.) 8.5 g 1   ASPIRIN LOW DOSE 81 MG EC tablet TAKE 1 TABLET(81 MG) BY MOUTH DAILY (Patient taking differently: Take 81 mg by mouth daily.) 30 tablet 0   atorvastatin (LIPITOR) 80 MG tablet Take 80 mg by mouth daily.      Cholecalciferol (VITAMIN D-3 PO) Take 1 capsule by mouth 2 (two) times daily.     cilostazol (PLETAL) 50 MG tablet Take 50 mg by mouth 2 (two) times daily.     ezetimibe (ZETIA) 10 MG tablet TAKE 1 TABLET BY MOUTH DAILY (Patient taking differently: Take 10 mg by mouth daily.) 90 tablet 3   ferrous sulfate 325 (65 FE) MG tablet Take 325 mg by mouth daily.     fluticasone (FLONASE) 50 MCG/ACT nasal spray Place 1 spray into both nostrils daily  as needed for allergies.     Fluticasone-Umeclidin-Vilant (TRELEGY ELLIPTA) 100-62.5-25 MCG/ACT AEPB Inhale 1 puff into the lungs daily. 1 each 5   furosemide (LASIX) 40 MG tablet Take 1 tablet (40 mg total) by mouth 2 (two) times daily. 60 tablet 0   gabapentin (NEURONTIN) 600 MG tablet Take 1 tablet (600 mg total) by mouth at bedtime. 30 tablet 11   ipratropium-albuterol (DUONEB) 0.5-2.5 (3) MG/3ML SOLN Take 3 mLs by nebulization 3 (three) times daily. (Patient taking differently: Take 3 mLs by nebulization 3 (three) times daily as needed (wheezing).) 270 mL 1   LINZESS 145 MCG CAPS capsule Take 145 mcg by mouth daily as needed (Constipation).     losartan (COZAAR) 25 MG tablet Take 1 tablet (25 mg total) by mouth daily. 30 tablet 0   metFORMIN (GLUCOPHAGE) 500 MG tablet Take 0.5 tablets (250 mg total) by mouth 2 (two) times daily with a meal.     metoprolol tartrate (LOPRESSOR) 25 MG tablet Take 1 tablet (25 mg total) by mouth 2 (two) times daily. 60 tablet 11   MYRBETRIQ 25 MG TB24 tablet Take 25 mg by mouth daily.     nitroGLYCERIN (NITROSTAT) 0.4 MG SL tablet Place 1 tablet (0.4 mg total) under the tongue every 5 (five) minutes as needed for chest pain. 90 tablet 3   OXYGEN Inhale 3 L into the lungs continuous.     pantoprazole (PROTONIX) 40 MG tablet Take 1 tablet (40 mg total) by mouth 2 (two) times daily. (Patient taking differently: Take 40 mg by mouth daily.) 60 tablet 0   potassium chloride SA (KLOR-CON M) 20 MEQ tablet Take 1 tablet (20 mEq total)  by mouth daily. 30 tablet 0   sacubitril-valsartan (ENTRESTO) 24-26 MG Take 1 tablet by mouth 2 (two) times daily. 60 tablet 2   spironolactone (ALDACTONE) 50 MG tablet TAKE 1 TABLET BY MOUTH DAILY (Patient taking differently: Take 50 mg by mouth daily.) 100 tablet 2   No current facility-administered medications for this visit.    Review of Systems  Constitutional: positive for fatigue Eyes: negative Ears, nose, mouth, throat, and face: negative Respiratory: positive for dyspnea on exertion and pleurisy/chest pain Cardiovascular: negative Gastrointestinal: positive for melena and nausea Genitourinary:negative Integument/breast: negative Hematologic/lymphatic: negative Musculoskeletal:positive for arthralgias Neurological: negative Behavioral/Psych: negative Endocrine: negative Allergic/Immunologic: negative  Physical Exam  UMP:NTIRW, healthy, no distress, well nourished, and well developed SKIN: skin color, texture, turgor are normal, no rashes or significant lesions HEAD: Normocephalic, No masses, lesions, tenderness or abnormalities EYES: normal, PERRLA, Conjunctiva are pink and non-injected EARS: External ears normal, Canals clear OROPHARYNX:no exudate, no erythema, and lips, buccal mucosa, and tongue normal  NECK: supple, no adenopathy, no JVD LYMPH:  no palpable lymphadenopathy, no hepatosplenomegaly BREAST:not examined LUNGS: clear to auscultation , and palpation HEART: regular rate & rhythm, no murmurs, and no gallops ABDOMEN:abdomen soft, non-tender, normal bowel sounds, and no masses or organomegaly BACK: Back symmetric, no curvature., No CVA tenderness EXTREMITIES:no joint deformities, effusion, or inflammation, no edema  NEURO: alert & oriented x 3 with fluent speech, no focal motor/sensory deficits  PERFORMANCE STATUS: ECOG 1  LABORATORY DATA: Lab Results  Component Value Date   WBC 9.1 06/20/2022   HGB 9.4 (L) 06/20/2022   HCT 29.9 (L) 06/20/2022   MCV  85.4 06/20/2022   PLT 473 (H) 06/20/2022      Chemistry      Component Value Date/Time   NA 140 06/20/2022 1008   NA 138 03/26/2021  1328   K 3.5 06/20/2022 1008   CL 101 06/20/2022 1008   CO2 32 06/20/2022 1008   BUN 21 06/20/2022 1008   BUN 12 03/26/2021 1328   CREATININE 0.71 06/20/2022 1008      Component Value Date/Time   CALCIUM 10.1 06/20/2022 1008   ALKPHOS 72 06/20/2022 1008   AST 12 (L) 06/20/2022 1008   ALT 6 06/20/2022 1008   BILITOT 0.3 06/20/2022 1008       RADIOGRAPHIC STUDIES: No results found.  ASSESSMENT: This is a pleasant 71 years old African-American female with history of iron deficiency anemia likely secondary to gastrointestinal blood loss and the patient has black tarry stool and she was referred to Dr. Benson Norway for further evaluation.  In the past for similar problem but she was lost to follow-up.   PLAN: I had a lengthy discussion with the patient today about her current condition and treatment options. I ordered several studies today for reevaluation of her anemia including repeat CBC that showed hemoglobin of 9.4 and hematocrit 29.9%.  MCV is 85.4.  Iron studies showed low serum iron of 22 with iron saturation of 5% and TIBC of 482.  Ferritin level was low at 8. Ordered other studies including vitamin B12 serum folate that were normal.  She also has normal LDH.  Blood for heavy metals and SPEP with immunofixation still pending. I recommended for the patient to proceed with iron infusion with Venofer 300 Mg IV weekly for 3 weeks. I will see her back for follow-up visit in 3 months for evaluation with repeat CBC, iron study and ferritin. The patient was advised to call immediately if she has any other concerning symptoms in the interval.   The patient voices understanding of current disease status and treatment options and is in agreement with the current care plan.  All questions were answered. The patient knows to call the clinic with any problems,  questions or concerns. We can certainly see the patient much sooner if necessary.  Thank you so much for allowing me to participate in the care of [name]. I will continue to follow up the patient with you and assist in her care.  The total time spent in the appointment was 60 minutes.  Disclaimer: This note was dictated with voice recognition software. Similar sounding words can inadvertently be transcribed and may not be corrected upon review.   Eilleen Kempf June 20, 2022, 11:18 AM

## 2022-06-21 ENCOUNTER — Encounter: Payer: Self-pay | Admitting: Internal Medicine

## 2022-06-25 ENCOUNTER — Ambulatory Visit
Admission: RE | Admit: 2022-06-25 | Discharge: 2022-06-25 | Disposition: A | Discharge status: Home / Self Care | Payer: Medicare HMO | Source: Ambulatory Visit | Attending: Physical Medicine & Rehabilitation | Admitting: Physical Medicine & Rehabilitation

## 2022-06-25 ENCOUNTER — Other Ambulatory Visit: Payer: Self-pay | Admitting: Physical Medicine & Rehabilitation

## 2022-06-25 ENCOUNTER — Encounter: Payer: Self-pay | Admitting: Internal Medicine

## 2022-06-25 DIAGNOSIS — M62830 Muscle spasm of back: Secondary | ICD-10-CM

## 2022-06-25 DIAGNOSIS — M5451 Vertebrogenic low back pain: Secondary | ICD-10-CM

## 2022-06-25 LAB — PROTEIN ELECTROPHORESIS, SERUM, WITH REFLEX
A/G Ratio: 1 (ref 0.7–1.7)
Albumin ELP: 3.7 g/dL (ref 2.9–4.4)
Alpha-1-Globulin: 0.3 g/dL (ref 0.0–0.4)
Alpha-2-Globulin: 0.7 g/dL (ref 0.4–1.0)
Beta Globulin: 1.1 g/dL (ref 0.7–1.3)
Gamma Globulin: 1.4 g/dL (ref 0.4–1.8)
Globulin, Total: 3.6 g/dL (ref 2.2–3.9)
Total Protein ELP: 7.3 g/dL (ref 6.0–8.5)

## 2022-06-26 LAB — HEAVY METALS, BLOOD
Arsenic: 3 ug/L (ref 0–9)
Lead: <1 ug/dL (ref 0.0–3.4)
Mercury: <1 ug/L (ref 0.0–14.9)

## 2022-06-27 NOTE — Progress Notes (Signed)
  Care Coordination Note  06/27/2022 Name: [name] MRN: [id number] DOB: 1950-12-18  [name] is a 72 y.o. year old female who is a primary care patient of Cleta Alberts, MD and is actively engaged with the care management team. I reached out to [name] by phone today to assist with re-scheduling a follow up visit with the RN Case Manager  Follow up plan: Telephone appointment with care management team member scheduled for: 07/01/2022  Julian Hy, [place] Direct Dial: [phone]

## 2022-06-28 ENCOUNTER — Other Ambulatory Visit: Payer: Self-pay

## 2022-06-28 ENCOUNTER — Telehealth: Payer: Self-pay | Admitting: Pharmacy Technician

## 2022-06-28 ENCOUNTER — Other Ambulatory Visit: Payer: Self-pay | Admitting: Pharmacy Technician

## 2022-06-28 NOTE — Progress Notes (Signed)
Confirmed with patient on the phone today that she is not taking the losartan.  Patient is taking Entresto.

## 2022-06-28 NOTE — Telephone Encounter (Signed)
Dr. Julien Nordmann, Juluis Rainier note:  Auth Submission: NO AUTH NEEDED Payer: [employer] Medication & CPT/J Code(s) submitted: Venofer (Iron Sucrose) J1756 Route of submission (phone, fax, portal):  Phone # Fax # Auth type: Buy/Bill Units/visits requested: X3 Reference number:  Approval from: 06/28/22 to 09/27/22

## 2022-07-01 ENCOUNTER — Ambulatory Visit: Payer: Self-pay

## 2022-07-01 NOTE — Patient Outreach (Signed)
  Care Coordination   Follow Up Visit Note   07/01/2022 Name: [name] MRN: [id number] DOB: [birth year]  [name] is a 72 y.o. year old female who sees Carla Alberts, MD for primary care. I spoke with  [name] by phone today.  What matters to the patients health and wellness today?  [name] reports she has transitioned to Dr. Cleta [name] with [facility]. She states she was seen last November 2023. She reports she has seen cardiologist 03/14/22, pulmonologist on 05/28/22 and oncology 06/20/22. She states her blood pressure and weights are being transmitted to Dr. Irven Shelling office daily. She reports her blood pressure was running a little low this morning. She states repeat blood pressure was 90/54 this morning and states is feeling a little dizzy since last night. She states she is going to call cardiologist office to discuss today.  She states she is to receive infusions and will follow up with Dr. Worthy Flank office for scheduling.   Goals Addressed             This Visit's Progress    HF management       Care Coordination Interventions: Reviewed upcoming/scheduled appointments and encouraged to continue to attend as scheduled Encouraged patient to contact [facility] primary care provider health questions or concerns as needed Encouraged to contact cardiologist if any questions or concerns regarding blood pressure, weight or signs/symptoms of heart failure exacerbation Encouraged to contact pulmonology for any pulmonary health concerns as needed Provided positive feedback regarding self management of health RNCM scheduled to follow up with patient tomorrow regarding patient's blood pressure. Per [name]. Blood pressure and weight transmitted to cardiology office daily. [name] to contact cardiologist to discuss.      SDOH assessments and interventions completed:  No  Care Coordination Interventions:  Yes, provided   Follow up  plan: Follow up call scheduled for 07/02/22    Encounter Outcome:  Pt. Visit Completed   Carla Silversmith, RN, MSN, BSN, [employer] Coordinator [phone]

## 2022-07-01 NOTE — Patient Instructions (Signed)
Visit Information  Thank you for taking time to visit with me today. Please don't hesitate to contact me if I can be of assistance to you.   Following are the goals we discussed today:   Goals Addressed             This Visit's Progress    HF management       Care Coordination Interventions: Reviewed upcoming/scheduled appointments and encouraged to continue to attend as scheduled Encouraged patient to contact [facility] primary care provider health questions or concerns as needed Encouraged to contact cardiologist if any questions or concerns regarding blood pressure, weight or signs/symptoms of heart failure exacerbation Encouraged to contact pulmonology for any pulmonary health concerns as needed Provided positive feedback regarding self management of health RNCM scheduled to follow up with patient tomorrow regarding patient's blood pressure. Per Ms. Banfill. Blood pressure and weight transmitted to cardiology office daily. Ms. Iannello to contact cardiologist to discuss.      Our next appointment is by telephone on 07/02/22 at 2:00 pm  Please call the care guide team at [phone] if you need to cancel or reschedule your appointment.   If you are experiencing a Mental Health or [place] or need someone to talk to, please call the Suicide and Crisis Lifeline: [place], RN, MSN, BSN, [place] [phone]

## 2022-07-02 ENCOUNTER — Telehealth: Payer: Self-pay

## 2022-07-02 NOTE — Patient Outreach (Signed)
  Care Coordination   07/02/2022 Name: [name] MRN: [id number] DOB: 1951/01/12   Care Coordination Outreach Attempts:  An unsuccessful telephone outreach was attempted for a scheduled appointment today.  Follow Up Plan: Outreach to follow up with patient re: her BP. Patient was to outreach to cardiology on 07/01/22. Additional outreach attempts will be made to offer the patient care coordination information and services.   Encounter Outcome:  No Answer   Care Coordination Interventions:  No, not indicated    Thea Silversmith, RN, MSN, BSN, [place] Coordinator [phone]

## 2022-07-08 ENCOUNTER — Ambulatory Visit (INDEPENDENT_AMBULATORY_CARE_PROVIDER_SITE_OTHER): Payer: Medicare HMO

## 2022-07-08 VITALS — BP 124/74 | HR 68 | Temp 97.7°F | Resp 20 | Ht 59.0 in | Wt 230.6 lb

## 2022-07-08 DIAGNOSIS — D500 Iron deficiency anemia secondary to blood loss (chronic): Secondary | ICD-10-CM | POA: Diagnosis not present

## 2022-07-08 MED ORDER — DIPHENHYDRAMINE HCL 25 MG PO CAPS
25.0000 mg | ORAL_CAPSULE | Freq: Once | ORAL | Status: AC
  Administered 2022-07-08: 25 mg via ORAL
  Filled 2022-07-08: qty 1

## 2022-07-08 MED ORDER — ACETAMINOPHEN 325 MG PO TABS
650.0000 mg | ORAL_TABLET | Freq: Once | ORAL | Status: AC
  Administered 2022-07-08: 650 mg via ORAL
  Filled 2022-07-08: qty 2

## 2022-07-08 MED ORDER — SODIUM CHLORIDE 0.9 % IV SOLN
300.0000 mg | INTRAVENOUS | Status: DC
  Administered 2022-07-08: 300 mg via INTRAVENOUS
  Filled 2022-07-08: qty 15

## 2022-07-08 NOTE — Progress Notes (Signed)
Diagnosis: Iron Deficiency Anemia  Provider:  Chilton Greathouse MD  Procedure: Infusion  IV Type: Peripheral, IV Location: R Antecubital  Venofer (Iron Sucrose), Dose: 300 mg  Infusion Start Time: 1356  Infusion Stop Time: 1544  Post Infusion IV Care: Observation period completed and Peripheral IV Discontinued  Discharge: Condition: Good, Destination: Home . AVS provided to patient.   Performed by:  Loney Hering, LPN

## 2022-07-11 ENCOUNTER — Telehealth: Payer: Self-pay | Admitting: Medical Oncology

## 2022-07-11 NOTE — Telephone Encounter (Signed)
F/u IV iron-feels worse.  " I am not feeling it at all. I feel worse after the iron. No energy., I am not perky at all".She struggles to get out of bed or to keep her eyes open.  Daughter reports these are symptoms she had before the IV iron.  She has encouraged her mother to get up and do something, but the pt says she cannot because she cannot catch her breath. With activity. No near falls.  Pt received benadryl 50 mg and tylenol 650 pre iron at 1330 on Monday. Then she took Gabapentin 600 mg every night this week.  Daughter states she has been drinking fluids during the day.  Per Dr Arbutus Ped , continue to monitor symptoms , drink extra fluids and call if symptoms worsen breathing , dizziness, falling.

## 2022-07-16 ENCOUNTER — Ambulatory Visit (INDEPENDENT_AMBULATORY_CARE_PROVIDER_SITE_OTHER): Payer: Medicare HMO | Admitting: Acute Care

## 2022-07-16 ENCOUNTER — Encounter: Payer: Self-pay | Admitting: Acute Care

## 2022-07-16 DIAGNOSIS — F17211 Nicotine dependence, cigarettes, in remission: Secondary | ICD-10-CM | POA: Diagnosis not present

## 2022-07-16 DIAGNOSIS — Z87891 Personal history of nicotine dependence: Secondary | ICD-10-CM | POA: Diagnosis not present

## 2022-07-16 NOTE — Progress Notes (Signed)
Virtual Visit via Telephone Note  I connected with [name] on 07/16/22 at  3:30 PM EST by telephone and verified that I am speaking with the correct person using two identifiers.  Location: Patient:  At home Provider:  [address] [place], [place], [address]    I discussed the limitations, risks, security and privacy concerns of performing an evaluation and management service by telephone and the availability of in person appointments. I also discussed with the patient that there may be a patient responsible charge related to this service. The patient expressed understanding and agreed to proceed.    Bevelyn Ngo, NP  Shared Decision Making Visit Lung Cancer Screening Program [phone])   Eligibility: Age 72 y.o. Pack Years Smoking History Calculation 32 pack year smoking history (# packs/per year x # years smoked) Recent History of coughing up blood  no Unexplained weight loss? no ( >Than 15 pounds within the last 6 months ) Prior History Lung / other cancer no (Diagnosis within the last 5 years already requiring surveillance chest CT Scans). Smoking Status Former Smoker Former Smokers: Years since quit: 1 year  Quit Date: 05/25/2021  Visit Components: Discussion included one or more decision making aids. yes Discussion included risk/benefits of screening. yes Discussion included potential follow up diagnostic testing for abnormal scans. yes Discussion included meaning and risk of over diagnosis. yes Discussion included meaning and risk of False Positives. yes Discussion included meaning of total radiation exposure. yes  Counseling Included: Importance of adherence to annual lung cancer LDCT screening. yes Impact of comorbidities on ability to participate in the program. yes Ability and willingness to under diagnostic treatment. yes  Smoking Cessation Counseling: Current Smokers:  Discussed importance of smoking cessation. yes Information about  tobacco cessation classes and interventions provided to patient. yes Patient provided with "ticket" for LDCT Scan. yes Symptomatic Patient. no  Counseling NA Diagnosis Code: Tobacco Use Z72.0 Asymptomatic Patient yes  Counseling (Intermediate counseling: > three minutes counseling) P7424 Former Smokers:  Discussed the importance of maintaining cigarette abstinence. yes Diagnosis Code: Personal History of Nicotine Dependence. S08.299 Information about tobacco cessation classes and interventions provided to patient. Yes Patient provided with "ticket" for LDCT Scan. yes Written Order for Lung Cancer Screening with LDCT placed in Epic. Yes (CT Chest Lung Cancer Screening Low Dose W/O CM) [id number] Z12.2-Screening of respiratory organs Z87.891-Personal history of nicotine dependence  I spent 25 minutes of face to face time/virtual visit time  with  Ms. Leaman discussing the risks and benefits of lung cancer screening. We took the time to pause the power point at intervals to allow for questions to be asked and answered to ensure understanding. We discussed that she had taken the single most powerful action possible to decrease her risk of developing lung cancer when she quit smoking. I counseled her to remain smoke free, and to contact me if she ever had the desire to smoke again so that I can provide resources and tools to help support the effort to remain smoke free. We discussed the time and location of the scan, and that either  Abigail Miyamoto RN, Karlton Lemon, RN or I  or I will call / send a letter with the results within  24-72 hours of receiving them. She has the office contact information in the event she needs to speak with me,  she verbalized understanding of all of the above and had no further questions upon leaving the office.     I  explained to the patient that there has been a high incidence of coronary artery disease noted on these exams. I explained that this is a non-gated exam  therefore degree or severity cannot be determined. This patient is on statin therapy. I have asked the patient to follow-up with their PCP regarding any incidental finding of coronary artery disease and management with diet or medication as they feel is clinically indicated. The patient verbalized understanding of the above and had no further questions.     Bevelyn Ngo, NP 07/16/2022

## 2022-07-16 NOTE — Patient Instructions (Signed)
Thank you for participating in the [facility]. It was our pleasure to meet you today. We will call you with the results of your scan within the next few days. Your scan will be assigned a Lung RADS category score by the physicians reading the scans.  This Lung RADS score determines follow up scanning.  See below for description of categories, and follow up screening recommendations. We will be in touch to schedule your follow up screening annually or based on recommendations of our providers. We will fax a copy of your scan results to your Primary Care Physician, or the physician who referred you to the program, to ensure they have the results. Please call the office if you have any questions or concerns regarding your scanning experience or results.  Our office number is [phone]. Please speak with Doroteo Glassman, RN. , or  Joella Prince RN, They are  our Lung Cancer Screening RN.'s If They are unavailable when you call, Please leave a message on the voice mail. We will return your call at our earliest convenience.This voice mail is monitored several times a day.  Remember, if your scan is normal, we will scan you annually as long as you continue to meet the criteria for the program. (Age 61-80, Current smoker or smoker who has quit within the last 15 years). If you are a smoker, remember, quitting is the single most powerful action that you can take to decrease your risk of lung cancer and other pulmonary, breathing related problems. We know quitting is hard, and we are here to help.  Please let us know if there is anything we can do to help you meet your goal of quitting. 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 We are proud of you! Remain smoke free! Remember you can refer friends or family members through the number above.  We will screen them to make sure they meet criteria for the program. Thank you for helping [place] take better care of you by  participating in Lung Screening.  You can receive free nicotine replacement therapy ( patches, gum or mints) by calling 1-800-QUIT NOW. Please call so we can get you on the path to becoming  a non-smoker. I know it is hard, but you can do this!  Lung RADS Categories:  Lung RADS 1: no nodules or definitely non-concerning nodules.  Recommendation is for a repeat annual scan in 12 months.  Lung RADS 2:  nodules that are non-concerning in appearance and behavior with a very low likelihood of becoming an active cancer. Recommendation is for a repeat annual scan in 12 months.  Lung RADS 3: nodules that are probably non-concerning , includes nodules with a low likelihood of becoming an active cancer.  Recommendation is for a 51-month repeat screening scan. Often noted after an upper respiratory illness. We will be in touch to make sure you have no questions, and to schedule your 38-month scan.  Lung RADS 4 A: nodules with concerning findings, recommendation is most often for a follow up scan in 3 months or additional testing based on our provider's assessment of the scan. We will be in touch to make sure you have no questions and to schedule the recommended 3 month follow up scan.  Lung RADS 4 B:  indicates findings that are concerning. We will be in touch with you to schedule additional diagnostic testing based on our provider's  assessment of the scan.  Other options for assistance in smoking cessation (  As covered by your insurance benefits)  Hypnosis for smoking cessation  [employer]. [phone]  Acupuncture for smoking cessation  [facility] [phone]

## 2022-07-17 ENCOUNTER — Ambulatory Visit
Admission: RE | Admit: 2022-07-17 | Discharge: 2022-07-17 | Disposition: A | Discharge status: Home / Self Care | Payer: Medicare HMO | Source: Ambulatory Visit | Attending: Acute Care | Admitting: Acute Care

## 2022-07-17 DIAGNOSIS — Z87891 Personal history of nicotine dependence: Secondary | ICD-10-CM

## 2022-07-17 DIAGNOSIS — Z122 Encounter for screening for malignant neoplasm of respiratory organs: Secondary | ICD-10-CM

## 2022-07-19 ENCOUNTER — Ambulatory Visit (INDEPENDENT_AMBULATORY_CARE_PROVIDER_SITE_OTHER): Payer: Medicare HMO | Admitting: *Deleted

## 2022-07-19 ENCOUNTER — Encounter: Payer: Self-pay | Admitting: Acute Care

## 2022-07-19 ENCOUNTER — Other Ambulatory Visit: Payer: Self-pay | Admitting: Acute Care

## 2022-07-19 VITALS — BP 92/60 | HR 73 | Temp 98.1°F | Resp 20 | Ht 59.0 in | Wt 236.8 lb

## 2022-07-19 DIAGNOSIS — Z122 Encounter for screening for malignant neoplasm of respiratory organs: Secondary | ICD-10-CM

## 2022-07-19 DIAGNOSIS — K922 Gastrointestinal hemorrhage, unspecified: Secondary | ICD-10-CM

## 2022-07-19 DIAGNOSIS — Z87891 Personal history of nicotine dependence: Secondary | ICD-10-CM

## 2022-07-19 DIAGNOSIS — D500 Iron deficiency anemia secondary to blood loss (chronic): Secondary | ICD-10-CM | POA: Diagnosis not present

## 2022-07-19 MED ORDER — ACETAMINOPHEN 325 MG PO TABS
650.0000 mg | ORAL_TABLET | Freq: Once | ORAL | Status: AC
  Administered 2022-07-19: 650 mg via ORAL
  Filled 2022-07-19: qty 2

## 2022-07-19 MED ORDER — DIPHENHYDRAMINE HCL 25 MG PO CAPS
25.0000 mg | ORAL_CAPSULE | Freq: Once | ORAL | Status: AC
  Administered 2022-07-19: 25 mg via ORAL
  Filled 2022-07-19: qty 1

## 2022-07-19 MED ORDER — SODIUM CHLORIDE 0.9 % IV SOLN
300.0000 mg | INTRAVENOUS | Status: DC
  Administered 2022-07-19: 300 mg via INTRAVENOUS
  Filled 2022-07-19: qty 15

## 2022-07-19 NOTE — Progress Notes (Signed)
Diagnosis: Iron Deficiency Anemia  Provider:  Marshell Garfinkel MD  Procedure: Infusion  IV Type: Peripheral, IV Location: R Antecubital  Venofer (Iron Sucrose), Dose: 300 mg  Infusion Start Time: 3734  Infusion Stop Time: 2876  Post Infusion IV Care: Patient declined observation and Peripheral IV Discontinued  Discharge: Condition: Good, Destination: Home . AVS provided to patient.   Performed by:  Baxter Hire, RN

## 2022-07-26 ENCOUNTER — Ambulatory Visit (INDEPENDENT_AMBULATORY_CARE_PROVIDER_SITE_OTHER): Payer: Medicare HMO

## 2022-07-26 VITALS — BP 102/64 | HR 76 | Temp 98.3°F | Resp 18 | Ht 59.0 in | Wt 230.2 lb

## 2022-07-26 DIAGNOSIS — K5521 Angiodysplasia of colon with hemorrhage: Secondary | ICD-10-CM | POA: Diagnosis not present

## 2022-07-26 DIAGNOSIS — D500 Iron deficiency anemia secondary to blood loss (chronic): Secondary | ICD-10-CM

## 2022-07-26 MED ORDER — ACETAMINOPHEN 325 MG PO TABS
650.0000 mg | ORAL_TABLET | Freq: Once | ORAL | Status: AC
  Administered 2022-07-26: 650 mg via ORAL
  Filled 2022-07-26: qty 2

## 2022-07-26 MED ORDER — DIPHENHYDRAMINE HCL 25 MG PO CAPS
25.0000 mg | ORAL_CAPSULE | Freq: Once | ORAL | Status: AC
  Administered 2022-07-26: 25 mg via ORAL
  Filled 2022-07-26: qty 1

## 2022-07-26 MED ORDER — SODIUM CHLORIDE 0.9 % IV SOLN
300.0000 mg | INTRAVENOUS | Status: DC
  Administered 2022-07-26: 300 mg via INTRAVENOUS
  Filled 2022-07-26: qty 15

## 2022-07-26 NOTE — Progress Notes (Signed)
Diagnosis: Iron Deficiency Anemia  Provider:  Marshell Garfinkel MD  Procedure: Infusion  IV Type: Peripheral, IV Location: R Antecubital  Venofer (Iron Sucrose), Dose: 300 mg  Infusion Start Time: 8004  Infusion Stop Time: 1508  Post Infusion IV Care: Peripheral IV Discontinued  Discharge: Condition: Good, Destination: Home . AVS provided to patient.   Performed by:  Cleophus Molt, RN

## 2022-07-29 ENCOUNTER — Encounter: Payer: Self-pay | Admitting: Internal Medicine

## 2022-08-05 ENCOUNTER — Ambulatory Visit: Payer: Medicare HMO

## 2022-08-05 DIAGNOSIS — I6523 Occlusion and stenosis of bilateral carotid arteries: Secondary | ICD-10-CM

## 2022-08-16 ENCOUNTER — Other Ambulatory Visit: Payer: Self-pay

## 2022-08-16 DIAGNOSIS — I5033 Acute on chronic diastolic (congestive) heart failure: Secondary | ICD-10-CM

## 2022-08-16 MED ORDER — METOPROLOL SUCCINATE ER 25 MG PO TB24: 25.0000 mg | ORAL_TABLET | Freq: Every day | ORAL | 2 refills | Status: DC

## 2022-08-16 NOTE — Progress Notes (Signed)
Per Dr. Terri Skains - if patient is still taking the Lasix '40mg'$  BID, change to daily; also change current metoprolol to metoprolol succinate '25mg'$  daily.  Spoke with patient - she is NOT taking any lasix Changed metoprolol tartrate '50mg'$  BID (patient reported dose) to metoprolol succinate '25mg'$  daily

## 2022-08-19 NOTE — Progress Notes (Signed)
Called patient to inform her about her duplex results. Patient understood

## 2022-09-04 ENCOUNTER — Other Ambulatory Visit: Payer: Self-pay

## 2022-09-04 ENCOUNTER — Emergency Department (HOSPITAL_COMMUNITY): Payer: Medicare HMO

## 2022-09-04 ENCOUNTER — Inpatient Hospital Stay (HOSPITAL_COMMUNITY)
Admission: EM | Admit: 2022-09-04 | Discharge: 2022-09-07 | DRG: 378 | Disposition: A | Discharge status: Home / Self Care | Payer: Medicare HMO | Attending: Internal Medicine | Admitting: Internal Medicine

## 2022-09-04 ENCOUNTER — Encounter (HOSPITAL_COMMUNITY): Payer: Self-pay

## 2022-09-04 DIAGNOSIS — Z7902 Long term (current) use of antithrombotics/antiplatelets: Secondary | ICD-10-CM

## 2022-09-04 DIAGNOSIS — J449 Chronic obstructive pulmonary disease, unspecified: Secondary | ICD-10-CM | POA: Diagnosis present

## 2022-09-04 DIAGNOSIS — K219 Gastro-esophageal reflux disease without esophagitis: Secondary | ICD-10-CM | POA: Diagnosis present

## 2022-09-04 DIAGNOSIS — K5521 Angiodysplasia of colon with hemorrhage: Secondary | ICD-10-CM | POA: Diagnosis not present

## 2022-09-04 DIAGNOSIS — D62 Acute posthemorrhagic anemia: Secondary | ICD-10-CM | POA: Diagnosis present

## 2022-09-04 DIAGNOSIS — I2510 Atherosclerotic heart disease of native coronary artery without angina pectoris: Secondary | ICD-10-CM | POA: Diagnosis not present

## 2022-09-04 DIAGNOSIS — I110 Hypertensive heart disease with heart failure: Secondary | ICD-10-CM | POA: Diagnosis present

## 2022-09-04 DIAGNOSIS — Z833 Family history of diabetes mellitus: Secondary | ICD-10-CM

## 2022-09-04 DIAGNOSIS — Z79899 Other long term (current) drug therapy: Secondary | ICD-10-CM

## 2022-09-04 DIAGNOSIS — E66813 Obesity, class 3: Secondary | ICD-10-CM | POA: Diagnosis present

## 2022-09-04 DIAGNOSIS — K922 Gastrointestinal hemorrhage, unspecified: Secondary | ICD-10-CM

## 2022-09-04 DIAGNOSIS — D649 Anemia, unspecified: Secondary | ICD-10-CM | POA: Diagnosis present

## 2022-09-04 DIAGNOSIS — I5032 Chronic diastolic (congestive) heart failure: Secondary | ICD-10-CM | POA: Diagnosis not present

## 2022-09-04 DIAGNOSIS — Z6841 Body Mass Index (BMI) 40.0 and over, adult: Secondary | ICD-10-CM

## 2022-09-04 DIAGNOSIS — Z8249 Family history of ischemic heart disease and other diseases of the circulatory system: Secondary | ICD-10-CM

## 2022-09-04 DIAGNOSIS — Z91199 Patient's noncompliance with other medical treatment and regimen due to unspecified reason: Secondary | ICD-10-CM

## 2022-09-04 DIAGNOSIS — F419 Anxiety disorder, unspecified: Secondary | ICD-10-CM | POA: Diagnosis present

## 2022-09-04 DIAGNOSIS — E1151 Type 2 diabetes mellitus with diabetic peripheral angiopathy without gangrene: Secondary | ICD-10-CM | POA: Diagnosis present

## 2022-09-04 DIAGNOSIS — G4733 Obstructive sleep apnea (adult) (pediatric): Secondary | ICD-10-CM | POA: Diagnosis present

## 2022-09-04 DIAGNOSIS — Z96652 Presence of left artificial knee joint: Secondary | ICD-10-CM | POA: Diagnosis present

## 2022-09-04 DIAGNOSIS — E785 Hyperlipidemia, unspecified: Secondary | ICD-10-CM | POA: Diagnosis present

## 2022-09-04 DIAGNOSIS — Z7982 Long term (current) use of aspirin: Secondary | ICD-10-CM

## 2022-09-04 DIAGNOSIS — Z87891 Personal history of nicotine dependence: Secondary | ICD-10-CM

## 2022-09-04 DIAGNOSIS — F32A Depression, unspecified: Secondary | ICD-10-CM | POA: Diagnosis present

## 2022-09-04 DIAGNOSIS — E119 Type 2 diabetes mellitus without complications: Secondary | ICD-10-CM

## 2022-09-04 DIAGNOSIS — Z7984 Long term (current) use of oral hypoglycemic drugs: Secondary | ICD-10-CM

## 2022-09-04 DIAGNOSIS — J9611 Chronic respiratory failure with hypoxia: Secondary | ICD-10-CM | POA: Diagnosis present

## 2022-09-04 DIAGNOSIS — I739 Peripheral vascular disease, unspecified: Secondary | ICD-10-CM | POA: Diagnosis present

## 2022-09-04 LAB — CBC
HCT: 25.8 % — ABNORMAL LOW (ref 36.0–46.0)
Hemoglobin: 7 g/dL — ABNORMAL LOW (ref 12.0–15.0)
MCH: 21.9 pg — ABNORMAL LOW (ref 26.0–34.0)
MCHC: 27.1 g/dL — ABNORMAL LOW (ref 30.0–36.0)
MCV: 80.9 fL (ref 80.0–100.0)
Platelets: 574 10*3/uL — ABNORMAL HIGH (ref 150–400)
RBC: 3.19 MIL/uL — ABNORMAL LOW (ref 3.87–5.11)
RDW: 18.8 % — ABNORMAL HIGH (ref 11.5–15.5)
WBC: 5.3 10*3/uL (ref 4.0–10.5)
nRBC: 0 % (ref 0.0–0.2)

## 2022-09-04 LAB — PROTIME-INR
INR: 1.1 (ref 0.8–1.2)
Prothrombin Time: 14.2 seconds (ref 11.4–15.2)

## 2022-09-04 LAB — IRON AND TIBC
Iron: 31 ug/dL (ref 28–170)
Saturation Ratios: 7 % — ABNORMAL LOW (ref 10.4–31.8)
TIBC: 428 ug/dL (ref 250–450)
UIBC: 397 ug/dL

## 2022-09-04 LAB — VITAMIN B12: Vitamin B-12: 265 pg/mL (ref 180–914)

## 2022-09-04 LAB — COMPREHENSIVE METABOLIC PANEL
ALT: 8 U/L (ref 0–44)
AST: 14 U/L — ABNORMAL LOW (ref 15–41)
Albumin: 3.7 g/dL (ref 3.5–5.0)
Alkaline Phosphatase: 68 U/L (ref 38–126)
Anion gap: 7 (ref 5–15)
BUN: 13 mg/dL (ref 8–23)
CO2: 27 mmol/L (ref 22–32)
Calcium: 8.9 mg/dL (ref 8.9–10.3)
Chloride: 104 mmol/L (ref 98–111)
Creatinine, Ser: 0.49 mg/dL (ref 0.44–1.00)
GFR, Estimated: >60 mL/min (ref >60)
Glucose, Bld: 120 mg/dL — ABNORMAL HIGH (ref 70–99)
Potassium: 3.7 mmol/L (ref 3.5–5.1)
Sodium: 138 mmol/L (ref 135–145)
Total Bilirubin: 0.4 mg/dL (ref 0.3–1.2)
Total Protein: 7.3 g/dL (ref 6.5–8.1)

## 2022-09-04 LAB — RETICULOCYTES
Immature Retic Fract: 28.4 % — ABNORMAL HIGH (ref 2.3–15.9)
RBC.: 3.09 MIL/uL — ABNORMAL LOW (ref 3.87–5.11)
Retic Count, Absolute: 75.4 10*3/uL (ref 19.0–186.0)
Retic Ct Pct: 2.4 % (ref 0.4–3.1)

## 2022-09-04 LAB — FERRITIN: Ferritin: 5 ng/mL — ABNORMAL LOW (ref 11–307)

## 2022-09-04 LAB — GLUCOSE, CAPILLARY: Glucose-Capillary: 97 mg/dL (ref 70–99)

## 2022-09-04 LAB — FOLATE: Folate: 12.3 ng/mL (ref >5.9)

## 2022-09-04 LAB — TSH: TSH: 1.995 u[IU]/mL (ref 0.350–4.500)

## 2022-09-04 LAB — PREPARE RBC (CROSSMATCH)

## 2022-09-04 LAB — POC OCCULT BLOOD, ED: Fecal Occult Bld: POSITIVE — AB

## 2022-09-04 LAB — MAGNESIUM: Magnesium: 1.7 mg/dL (ref 1.7–2.4)

## 2022-09-04 LAB — CK: Total CK: 32 U/L — ABNORMAL LOW (ref 38–234)

## 2022-09-04 LAB — PHOSPHORUS: Phosphorus: 4.4 mg/dL (ref 2.5–4.6)

## 2022-09-04 MED ORDER — IPRATROPIUM-ALBUTEROL 0.5-2.5 (3) MG/3ML IN SOLN
RESPIRATORY_TRACT | Status: AC
  Administered 2022-09-04: 3 mL via RESPIRATORY_TRACT
  Filled 2022-09-04: qty 3

## 2022-09-04 MED ORDER — METOPROLOL SUCCINATE ER 25 MG PO TB24
25.0000 mg | ORAL_TABLET | Freq: Every day | ORAL | Status: DC
  Administered 2022-09-05 – 2022-09-07 (×3): 25 mg via ORAL
  Filled 2022-09-04 (×3): qty 1

## 2022-09-04 MED ORDER — SODIUM CHLORIDE 0.9% IV SOLUTION: Freq: Once | INTRAVENOUS | Status: DC

## 2022-09-04 MED ORDER — UMECLIDINIUM BROMIDE 62.5 MCG/ACT IN AEPB
1.0000 | INHALATION_SPRAY | Freq: Every day | RESPIRATORY_TRACT | Status: DC
  Administered 2022-09-05 – 2022-09-07 (×3): 1 via RESPIRATORY_TRACT
  Filled 2022-09-04: qty 7

## 2022-09-04 MED ORDER — SODIUM CHLORIDE 0.9% IV SOLUTION: Freq: Once | INTRAVENOUS | Status: AC

## 2022-09-04 MED ORDER — ALBUTEROL SULFATE (2.5 MG/3ML) 0.083% IN NEBU: 2.5000 mg | INHALATION_SOLUTION | Freq: Four times a day (QID) | RESPIRATORY_TRACT | Status: DC | PRN

## 2022-09-04 MED ORDER — INSULIN ASPART 100 UNIT/ML IJ SOLN
0.0000 [IU] | INTRAMUSCULAR | Status: DC
  Administered 2022-09-05: 2 [IU] via SUBCUTANEOUS
  Administered 2022-09-05 – 2022-09-06 (×3): 1 [IU] via SUBCUTANEOUS
  Administered 2022-09-06: 2 [IU] via SUBCUTANEOUS
  Administered 2022-09-06 – 2022-09-07 (×6): 1 [IU] via SUBCUTANEOUS
  Filled 2022-09-04: qty 0.09

## 2022-09-04 MED ORDER — ACETAMINOPHEN 325 MG PO TABS: 650.0000 mg | ORAL_TABLET | Freq: Four times a day (QID) | ORAL | Status: DC | PRN

## 2022-09-04 MED ORDER — ACETAMINOPHEN 650 MG RE SUPP: 650.0000 mg | Freq: Four times a day (QID) | RECTAL | Status: DC | PRN

## 2022-09-04 MED ORDER — GABAPENTIN 300 MG PO CAPS
600.0000 mg | ORAL_CAPSULE | Freq: Every day | ORAL | Status: DC
  Administered 2022-09-04 – 2022-09-06 (×3): 600 mg via ORAL
  Filled 2022-09-04 (×3): qty 2

## 2022-09-04 MED ORDER — SODIUM CHLORIDE 0.9% FLUSH: 3.0000 mL | INTRAVENOUS | Status: DC | PRN

## 2022-09-04 MED ORDER — HYDROCODONE-ACETAMINOPHEN 5-325 MG PO TABS: 1.0000 | ORAL_TABLET | ORAL | Status: DC | PRN

## 2022-09-04 MED ORDER — PANTOPRAZOLE SODIUM 40 MG IV SOLR: 40.0000 mg | INTRAVENOUS | Status: DC

## 2022-09-04 MED ORDER — PANTOPRAZOLE SODIUM 40 MG IV SOLR
40.0000 mg | Freq: Two times a day (BID) | INTRAVENOUS | Status: DC
  Administered 2022-09-04 – 2022-09-07 (×6): 40 mg via INTRAVENOUS
  Filled 2022-09-04 (×6): qty 10

## 2022-09-04 MED ORDER — EZETIMIBE 10 MG PO TABS
10.0000 mg | ORAL_TABLET | Freq: Every day | ORAL | Status: DC
  Administered 2022-09-05 – 2022-09-07 (×3): 10 mg via ORAL
  Filled 2022-09-04 (×3): qty 1

## 2022-09-04 MED ORDER — SODIUM CHLORIDE 0.9% FLUSH
3.0000 mL | Freq: Two times a day (BID) | INTRAVENOUS | Status: DC
  Administered 2022-09-04: 3 mL via INTRAVENOUS
  Administered 2022-09-05: 6 mL via INTRAVENOUS
  Administered 2022-09-05 – 2022-09-07 (×4): 3 mL via INTRAVENOUS

## 2022-09-04 MED ORDER — MAGNESIUM SULFATE IN D5W 1-5 GM/100ML-% IV SOLN
1.0000 g | Freq: Once | INTRAVENOUS | Status: AC
  Administered 2022-09-05: 1 g via INTRAVENOUS
  Filled 2022-09-04: qty 100

## 2022-09-04 MED ORDER — SODIUM CHLORIDE 0.9 % IV SOLN: 250.0000 mL | INTRAVENOUS | Status: DC | PRN

## 2022-09-04 MED ORDER — ATORVASTATIN CALCIUM 40 MG PO TABS
80.0000 mg | ORAL_TABLET | Freq: Every day | ORAL | Status: DC
  Administered 2022-09-05 – 2022-09-07 (×3): 80 mg via ORAL
  Filled 2022-09-04 (×3): qty 2

## 2022-09-04 MED ORDER — IPRATROPIUM-ALBUTEROL 0.5-2.5 (3) MG/3ML IN SOLN
3.0000 mL | Freq: Four times a day (QID) | RESPIRATORY_TRACT | Status: DC
  Administered 2022-09-05 – 2022-09-06 (×5): 3 mL via RESPIRATORY_TRACT
  Filled 2022-09-04 (×5): qty 3

## 2022-09-04 MED ORDER — FUROSEMIDE 10 MG/ML IJ SOLN: 20.0000 mg | Freq: Once | INTRAMUSCULAR | Status: DC

## 2022-09-04 MED ORDER — FLUTICASONE FUROATE-VILANTEROL 100-25 MCG/ACT IN AEPB
1.0000 | INHALATION_SPRAY | Freq: Every day | RESPIRATORY_TRACT | Status: DC
  Administered 2022-09-05 – 2022-09-07 (×3): 1 via RESPIRATORY_TRACT
  Filled 2022-09-04: qty 28

## 2022-09-04 NOTE — Assessment & Plan Note (Signed)
Check hemoglobin A1c order sliding scale hold metformin

## 2022-09-04 NOTE — ED Notes (Signed)
Pt to xray via stretcher

## 2022-09-04 NOTE — ED Notes (Signed)
Blood consent signed by pt

## 2022-09-04 NOTE — Assessment & Plan Note (Signed)
Hold aspitin, continue lipitor  80 mg po q dayand zetia

## 2022-09-04 NOTE — Assessment & Plan Note (Signed)
Hold pletal for now

## 2022-09-04 NOTE — Assessment & Plan Note (Signed)
Slightly on the drier side right now given decreased p.o. intake will hold Entresto spironolactone and Lasix and will administer Lasix IV as needed if patient appears to have fluid overload

## 2022-09-04 NOTE — Assessment & Plan Note (Signed)
Will need follow-up as an outpatient nutrition clinic

## 2022-09-04 NOTE — Subjective & Objective (Signed)
Patient was sent to emergency department just because she was found to have similar hemoglobin of 6.5 she denies any chest pain but has been having some shortness of breath for past 1 week she seemed to be symptomatic after blood transfusions in the past the last one was in January.  She denies seeing any bright red blood per rectum or black stools.  But she has been seen by Dr. [place] Crane in the past she has a polyp that she thinks has been losing blood.  No associated abdominal pain

## 2022-09-04 NOTE — Assessment & Plan Note (Signed)
Transfuse 1 unit and obtain serial CBC Gi is aware will see in AM

## 2022-09-04 NOTE — ED Provider Notes (Signed)
[place] EMERGENCY DEPARTMENT AT [facility] Provider Note   CSN: [id number] Arrival date & time: 09/04/22  1417     History  Chief Complaint  Patient presents with   abnormal lab    [name] is a 72 y.o. female.  HPI Patient is a 72 year old female present emergency room today with complaints of shortness of breath weakness fatigue lightheadedness  She states that she has a history of COPD, DM2, HTN, depression, CAD, reflux, HLD, OSA anxiety and chronic anemia has required occasional blood transfusions and iron infusions.  Per patient, she has a bleeding polyp that was found by Dr. Audley Hose of gastroenterology however she states that it was out of reach of biopsy.  She states that she then was referred to [facility] system for additional colonoscopy but did not have a procedure done because of her underlying CHF.  She denies any leg swelling, cough, hemoptysis, chest pain, nausea, vomiting.  No abdominal pain    Home Medications Prior to Admission medications   Medication Sig Start Date End Date Taking? Authorizing Provider  acetaminophen (TYLENOL) 500 MG tablet Take 500 mg by mouth every 6 (six) hours as needed.    [provider]  albuterol (PROVENTIL) (2.5 MG/3ML) 0.083% nebulizer solution Take 2.5 mg by nebulization every 6 (six) hours as needed for wheezing or shortness of breath.    [provider]  albuterol (VENTOLIN HFA) 108 (90 Base) MCG/ACT inhaler Inhale 1-2 puffs into the lungs every 6 (six) hours as needed for wheezing or shortness of breath. Patient taking differently: Inhale 2 puffs into the lungs every 6 (six) hours as needed for wheezing or shortness of breath. 11/13/21   Adhikari, Willia Craze, MD  ASPIRIN LOW DOSE 81 MG EC tablet TAKE 1 TABLET(81 MG) BY MOUTH DAILY Patient taking differently: Take 81 mg by mouth daily. 06/30/20   Magnant, Charles L, PA-C  atorvastatin (LIPITOR) 80 MG tablet Take 80 mg by mouth daily. 08/08/20    [provider]  Cholecalciferol (VITAMIN D-3 PO) Take 1 capsule by mouth 2 (two) times daily.    [provider]  cilostazol (PLETAL) 50 MG tablet Take 50 mg by mouth 2 (two) times daily. 01/24/22   [provider]  ezetimibe (ZETIA) 10 MG tablet TAKE 1 TABLET BY MOUTH DAILY Patient taking differently: Take 10 mg by mouth daily. 10/15/21   [name], [name], DO  ferrous sulfate 325 (65 FE) MG tablet Take 325 mg by mouth daily.    [provider]  fluticasone (FLONASE) 50 MCG/ACT nasal spray Place 1 spray into both nostrils daily as needed for allergies. 08/14/21   [provider]  Fluticasone-Umeclidin-Vilant (TRELEGY ELLIPTA) 100-62.5-25 MCG/ACT AEPB Inhale 1 puff into the lungs daily. 02/13/22   Icard, Rachel Bo, DO  furosemide (LASIX) 40 MG tablet Take 1 tablet (40 mg total) by mouth 2 (two) times daily. 03/01/22 04/30/22  Burnadette Pop, MD  gabapentin (NEURONTIN) 600 MG tablet Take 1 tablet (600 mg total) by mouth at bedtime. 10/02/17   Hyatt, Max T, DPM  ipratropium-albuterol (DUONEB) 0.5-2.5 (3) MG/3ML SOLN Take 3 mLs by nebulization 3 (three) times daily. Patient taking differently: Take 3 mLs by nebulization 3 (three) times daily as needed (wheezing). 11/13/21 02/27/23  Burnadette Pop, MD  LINZESS 145 MCG CAPS capsule Take 145 mcg by mouth daily as needed (Constipation). 02/11/22   [provider]  metFORMIN (GLUCOPHAGE) 500 MG tablet Take 0.5 tablets (250 mg total) by mouth 2 (two) times  daily with a meal. 02/13/17   Yates Decamp, MD  metoprolol succinate (TOPROL XL) 25 MG 24 hr tablet Take 1 tablet (25 mg total) by mouth daily. 08/16/22   [name], [name], DO  MYRBETRIQ 25 MG TB24 tablet Take 25 mg by mouth daily. 08/10/21   [provider]  nitroGLYCERIN (NITROSTAT) 0.4 MG SL tablet Place 1 tablet (0.4 mg total) under the tongue every 5 (five) minutes as needed for chest pain. 03/21/22 06/19/22  [name], [name], DO  OXYGEN Inhale 3 L into the lungs  continuous.    [provider]  pantoprazole (PROTONIX) 40 MG tablet Take 1 tablet (40 mg total) by mouth 2 (two) times daily. Patient taking differently: Take 40 mg by mouth daily. 09/25/21   Leroy Sea, MD  potassium chloride SA (KLOR-CON M) 20 MEQ tablet Take 1 tablet (20 mEq total) by mouth daily. 03/02/22   Burnadette Pop, MD  sacubitril-valsartan (ENTRESTO) 24-26 MG Take 1 tablet by mouth 2 (two) times daily. 03/25/22   [name], [name], DO  spironolactone (ALDACTONE) 50 MG tablet TAKE 1 TABLET BY MOUTH DAILY Patient taking differently: Take 50 mg by mouth daily. 12/17/21   Tessa Lerner, DO      Allergies    Patient has no known allergies.    Review of Systems   Review of Systems  Physical Exam Updated Vital Signs BP (!) 149/83 (BP Location: Left Arm)   Pulse 82   Temp 98.6 F (37 C) (Oral)   Resp (!) 22   Wt 104 kg   LMP  (LMP Unknown)   SpO2 95%   BMI 46.31 kg/m  Physical Exam Vitals and nursing note reviewed.  Constitutional:      General: She is not in acute distress. HENT:     Head: Normocephalic and atraumatic.     Nose: Nose normal.  Eyes:     General: No scleral icterus.    Comments: Pale conjunctiva  Cardiovascular:     Rate and Rhythm: Normal rate and regular rhythm.     Pulses: Normal pulses.     Heart sounds: Normal heart sounds.  Pulmonary:     Effort: Pulmonary effort is normal. No respiratory distress.     Breath sounds: No wheezing.     Comments: Tachypneic with even minimal exertion, lungs clear to auscultation Abdominal:     Palpations: Abdomen is soft.     Tenderness: There is no abdominal tenderness.  Genitourinary:    Comments: Sloan H chaperone  Rectal exam with dark stool Musculoskeletal:     Cervical back: Normal range of motion.     Right lower leg: No edema.     Left lower leg: No edema.  Skin:    General: Skin is warm and dry.     Capillary Refill: Capillary refill takes less than 2 seconds.  Neurological:     Mental  Status: She is alert. Mental status is at baseline.  Psychiatric:        Mood and Affect: Mood normal.        Behavior: Behavior normal.     ED Results / Procedures / Treatments   Labs (all labs ordered are listed, but only abnormal results are displayed) Labs Reviewed  CBC - Abnormal; Notable for the following components:      Result Value   RBC 3.19 (*)    Hemoglobin 7.0 (*)    HCT 25.8 (*)    MCH 21.9 (*)    MCHC 27.1 (*)  RDW 18.8 (*)    Platelets 574 (*)    All other components within normal limits  COMPREHENSIVE METABOLIC PANEL - Abnormal; Notable for the following components:   Glucose, Bld 120 (*)    AST 14 (*)    All other components within normal limits  CK - Abnormal; Notable for the following components:   Total CK 32 (*)    All other components within normal limits  RETICULOCYTES - Abnormal; Notable for the following components:   RBC. 3.09 (*)    Immature Retic Fract 28.4 (*)    All other components within normal limits  POC OCCULT BLOOD, ED - Abnormal; Notable for the following components:   Fecal Occult Bld POSITIVE (*)    All other components within normal limits  MAGNESIUM  PHOSPHORUS  FOLATE  PROTIME-INR  TSH  PREALBUMIN  VITAMIN B12  IRON AND TIBC  FERRITIN  HEMOGLOBIN A1C  CBC  TYPE AND SCREEN  PREPARE RBC (CROSSMATCH)    EKG None  Radiology DG Chest 2 View  Result Date: 09/04/2022 CLINICAL DATA:  Shortness of breath. EXAM: CHEST - 2 VIEW COMPARISON:  Chest radiographs 02/25/2022, 11/28/2021 FINDINGS: Cardiac silhouette is again moderately enlarged. Mediastinal contours are within normal limits with moderate calcification within the aortic arch. Mild bilateral mid and lower lung interstitial thickening and areas of linear scarring appear unchanged. No pleural effusion or pneumothorax. Moderate multilevel degenerative disc changes of the thoracic spine. IMPRESSION: 1. No acute lung process. 2. Mild bilateral mid and lower lung interstitial  thickening and linear densities appear unchanged. This again may represent mild interstitial pulmonary edema and/or scarring. Electronically Signed   By: Neita Garnet M.D.   On: 09/04/2022 14:50    Procedures .Critical Care  Performed by: Gailen Shelter, PA Authorized by: Gailen Shelter, PA   Critical care provider statement:    Critical care time (minutes):  35   Critical care time was exclusive of:  Separately billable procedures and treating other patients and teaching time   Critical care was necessary to treat or prevent imminent or life-threatening deterioration of the following conditions: Symptomatic active GI bleeding requiring transfusion of blood.   Critical care was time spent personally by me on the following activities:  Development of treatment plan with patient or surrogate, review of old charts, re-evaluation of patient's condition, pulse oximetry, ordering and review of radiographic studies, ordering and review of laboratory studies, ordering and performing treatments and interventions, obtaining history from patient or surrogate, examination of patient and evaluation of patient's response to treatment   Care discussed with: admitting provider       Medications Ordered in ED Medications  0.9 %  sodium chloride infusion (Manually program via Guardrails IV Fluids) (has no administration in time range)  insulin aspart (novoLOG) injection 0-9 Units (0 Units Subcutaneous Not Given 09/04/22 2216)  albuterol (PROVENTIL) (2.5 MG/3ML) 0.083% nebulizer solution 2.5 mg (has no administration in time range)  atorvastatin (LIPITOR) tablet 80 mg (has no administration in time range)  ezetimibe (ZETIA) tablet 10 mg (has no administration in time range)  fluticasone furoate-vilanterol (BREO ELLIPTA) 100-25 MCG/ACT 1 puff (has no administration in time range)    And  umeclidinium bromide (INCRUSE ELLIPTA) 62.5 MCG/ACT 1 puff (has no administration in time range)  ipratropium-albuterol  (DUONEB) 0.5-2.5 (3) MG/3ML nebulizer solution 3 mL (3 mLs Nebulization Given 09/04/22 2130)  gabapentin (NEURONTIN) capsule 600 mg (600 mg Oral Given 09/04/22 2218)  metoprolol succinate (TOPROL-XL) 24 hr tablet  25 mg (has no administration in time range)  pantoprazole (PROTONIX) injection 40 mg (40 mg Intravenous Given 09/04/22 2218)  0.9 %  sodium chloride infusion (Manually program via Guardrails IV Fluids) (has no administration in time range)  magnesium sulfate IVPB 1 g 100 mL (has no administration in time range)    ED Course/ Medical Decision Making/ A&P                             Medical Decision Making Amount and/or Complexity of Data Reviewed Labs: ordered. Radiology: ordered.  Risk Decision regarding hospitalization.   This patient presents to the ED for concern of fatigue, this involves a number of treatment options, and is a complaint that carries with it a high risk of complications and morbidity. A differential diagnosis was considered for the patient's symptoms which is discussed below:   The differential diagnosis of weakness includes but is not limited to neurologic causes (GBS, myasthenia gravis, CVA, MS, ALS, transverse myelitis, spinal cord injury, CVA, botulism, ) and other causes: ACS, Arrhythmia, syncope, orthostatic hypotension, sepsis, hypoglycemia, electrolyte disturbance, hypothyroidism, respiratory failure, symptomatic anemia, dehydration, heat injury, polypharmacy, malignancy.    Co morbidities: Discussed in HPI   Brief History:  Patient is a 72 year old female present emergency room today with complaints of shortness of breath weakness fatigue lightheadedness  She states that she has a history of COPD, DM2, HTN, depression, CAD, reflux, HLD, OSA anxiety and chronic anemia has required occasional blood transfusions and iron infusions.  Per patient, she has a bleeding polyp that was found by Dr. Audley Hose of gastroenterology however she states that it was  out of reach of biopsy.  She states that she then was referred to [facility] for additional colonoscopy but did not have a procedure done because of her underlying CHF.  She denies any leg swelling, cough, hemoptysis, chest pain, nausea, vomiting.  No abdominal pain    EMR reviewed including pt PMHx, past surgical history and past visits to ER.   See HPI for more details   Lab Tests:   I ordered and independently interpreted labs. Labs notable for Hemoglobin of 7, fecal occult positive, CMP unremarkable  Imaging Studies:  NAD. I personally reviewed all imaging studies and no acute abnormality found. I agree with radiology interpretation. Chest x-ray unremarkable   Cardiac Monitoring:  The patient was maintained on a cardiac monitor.  I personally viewed and interpreted the cardiac monitored which showed an underlying rhythm of:  NSR    Medicines ordered:  I ordered medication including PRBC 2 units for symptomatic anemia Reevaluation of the patient after these medicines showed that the patient improved I have reviewed the patients home medicines and have made adjustments as needed   Critical Interventions:  PRBC transfusion   Consults/Attending Physician   I requested consultation with Dr. Loreta Ave,  and discussed lab and imaging findings as well as pertinent plan - they recommend: will see in AM agrees w plan.   Dr. Adela Glimpse will admit  I discussed this case with my attending physician who cosigned this note including patient's presenting symptoms, physical exam, and planned diagnostics and interventions. Attending physician stated agreement with plan or made changes to plan which were implemented.   Attending physician assessed patient at bedside.   Reevaluation:  After the interventions noted above I re-evaluated patient and found that they have :stayed the same   Social Determinants of Health:  Problem List / ED Course:  Symptomatic anemia  in the setting of GI bleed.  No anticoagulation.  Vital signs without abnormal findings here in the ER.  PVCs ordered and admitted to hospitalist   Dispostion:  After consideration of the diagnostic results and the patients response to treatment, I feel that the patent would benefit from    Fin al Clinical Impression(s) / ED Diagnoses Final diagnoses:  Anemia, unspecified type  Gastrointestinal hemorrhage, unspecified gastrointestinal hemorrhage type    Rx / DC Orders ED Discharge Orders     None         Gailen Shelter, [place] 09/04/22 2229    Terald Sleeper, MD 09/05/22 1510

## 2022-09-04 NOTE — H&P (Signed)
[name] [id number] DOB: 1950/08/24 DOA: 09/04/2022     PCP: Cleta Alberts, MD   Outpatient Specialists:  CARDS:   Dr. Einar Gip   Pulmonary NP Elie Confer nurse practitioner  Oncology  Dr. Lorna Few Allene Pyo    Patient arrived to ER on 09/04/22 at 1417 Referred by Attending Kemper Durie, DO   Patient coming from:    Home     Chief Complaint:   Chief Complaint  Patient presents with   abnormal lab    HPI: [name] is a 72 y.o. female with medical history significant of COPD, tobacco abuse, diabetes, hypertension, depression, CAD, GERD, dyslipidemia, peripheral vascular disease, OSA, and anxiety, chronic anemia requiring blood transfusion and iron infusions.    Presented with   was found to have hemoglobin of 6.5 and feeling short of breath Patient was sent to emergency department just because she was found to have similar hemoglobin of 6.5 she denies any chest pain but has been having some shortness of breath for past 1 week she seemed to be symptomatic after blood transfusions in the past the last one was in January.  She denies seeing any bright red blood per rectum or black stools.  But she has been seen by Dr. Nevada Crane in the past she has a polyp that she thinks has been losing blood.  No associated abdominal pain First found to be anemic in 2019 at that time hemoglobin 6.6 she had a colonoscopy done by Dr. Benson Norway which was enforced unremarkable but was found to be iron deficient she has been receiving blood transfusions and iron infusions Pt states she was referred to [facility] but they fell uncomfortable to perform any procedures on account of her cardiac health  She reports black stools in the setting of chronically taking iron  Few weeks ago she had an episode of blood on her sheets She takes Aspirin 81 mg a day   Regarding pertinent Chronic problems:    Hyperlipidemia - on statins Lipitor (atorvastatin) ,zetia Lipid Panel     Component  Value Date/Time   CHOL 138 03/26/2021 1328   TRIG 78 03/26/2021 1328   HDL 54 03/26/2021 1328   CHOLHDL 4.5 05/12/2012 0237   VLDL 39 05/12/2012 0237   LDLCALC 69 03/26/2021 1328   LDLDIRECT 71 03/26/2021 1328   LABVLDL 15 03/26/2021 1328     HTN on Cozaar, metoprolol  PVD on Pletal   chronic CHF diastolic - last echo June 2023 EF 55-60%  Left EF 55 to 60%.  Lasix, Entresto, spironolactone    CAD  - On Aspirin, statin, betablocker,                  - followed by cardiology                DM 2 -  Lab Results  Component Value Date   HGBA1C 4.9 09/21/2021   diet controlled  Morbid obesity-   BMI Readings from Last 1 Encounters:  09/04/22 46.31 kg/m        COPD -  followed by pulmonology   not  on baseline oxygen     Trelegy     OSA - noncompliant with CPAP     Chronic anemia - baseline hg Hemoglobin & Hematocrit  Recent Labs    02/26/22 0402 06/20/22 1008 09/04/22 1500  HGB 9.1* 9.4* 7.0*     While in ER:   ER discussion with Dr. Collene Mares who is on-call  for Dr. Janan Halter who recommends admission for medicine n.p.o. postmidnight and Protonix      CXR -  NON acute    Following Medications were ordered in ER: Medications  0.9 %  sodium chloride infusion (Manually program via Guardrails IV Fluids) (has no administration in time range)    _______________________________________________________ ER Provider Called:   GI Dr. Collene Mares They Recommend admit to medicine   Will see in AM      ED Triage Vitals  Enc Vitals Group     BP 09/04/22 1422 (!) 144/69     Pulse Rate 09/04/22 1422 72     Resp 09/04/22 1422 16     Temp 09/04/22 1422 98.4 F (36.9 C)     Temp Source 09/04/22 1422 Oral     SpO2 09/04/22 1422 92 %     Weight 09/04/22 1427 229 lb 4.5 oz (104 kg)     Height --      Head Circumference --      Peak Flow --      Pain Score 09/04/22 1427 5     Pain Loc --      Pain Edu? --      Excl. in [place]? --   TMAX(24)@      _________________________________________ Significant initial  Findings: Abnormal Labs Reviewed  CBC - Abnormal; Notable for the following components:      Result Value   RBC 3.19 (*)    Hemoglobin 7.0 (*)    HCT 25.8 (*)    MCH 21.9 (*)    MCHC 27.1 (*)    RDW 18.8 (*)    Platelets 574 (*)    All other components within normal limits  COMPREHENSIVE METABOLIC PANEL - Abnormal; Notable for the following components:   Glucose, Bld 120 (*)    AST 14 (*)    All other components within normal limits  POC OCCULT BLOOD, ED - Abnormal; Notable for the following components:   Fecal Occult Bld POSITIVE (*)    All other components within normal limits    The recent clinical data is shown below. Vitals:   09/04/22 1758 09/04/22 1800 09/04/22 1830 09/04/22 1839  BP:  (!) 119/54 133/82   Pulse: 76 79 81   Resp: 15 20 (!) 24   Temp:    98.6 F (37 C)  TempSrc:    Oral  SpO2: 100% 96% 92%   Weight:        WBC     Component Value Date/Time   WBC 5.3 09/04/2022 1500   LYMPHSABS 1.5 06/20/2022 1008   MONOABS 1.0 06/20/2022 1008   EOSABS 0.2 06/20/2022 1008   BASOSABS 0.0 06/20/2022 1008     _______________________________________________ Hospitalist was called for admission for   Anemia, unspecified type    Gastrointestinal hemorrhage, unspecified gastrointestinal hemorrhage type      The following Work up has been ordered so far:  Orders Placed This Encounter  Procedures   DG Chest 2 View   CBC   Comprehensive metabolic panel   Informed Consent Details: Physician/Practitioner Attestation; Transcribe to consent form and obtain patient signature   Consult to gastroenterology  Mann/hung practice   Consult to hospitalist   POC occult blood, ED   Type and screen [place]   Prepare RBC (crossmatch)     OTHER Significant initial  Findings:  labs showing:    Recent Labs  Lab 09/04/22 1500  NA 138  K 3.7  CO2 27  GLUCOSE 120*  BUN 13   CREATININE 0.49  CALCIUM 8.9    Cr    stable,  Up from baseline see below Lab Results  Component Value Date   CREATININE 0.49 09/04/2022   CREATININE 0.71 06/20/2022   CREATININE 1.00 03/01/2022    Recent Labs  Lab 09/04/22 1500  AST 14*  ALT 8  ALKPHOS 68  BILITOT 0.4  PROT 7.3  ALBUMIN 3.7   Lab Results  Component Value Date   CALCIUM 8.9 09/04/2022   PHOS 2.8 05/11/2012    Plt: Lab Results  Component Value Date   PLT 574 (H) 09/04/2022     COVID-19 Labs  No results for input(s): "DDIMER", "FERRITIN", "LDH", "CRP" in the last 72 hours.  Lab Results  Component Value Date   SARSCOV2NAA NEGATIVE 02/25/2022   SARSCOV2NAA NEGATIVE 11/28/2021   SARSCOV2NAA NEGATIVE 11/28/2021   [place] NEGATIVE 11/07/2021        Recent Labs  Lab 09/04/22 1500  WBC 5.3  HGB 7.0*  HCT 25.8*  MCV 80.9  PLT 574*    HG/HCT stable,      Component Value Date/Time   HGB 7.0 (L) 09/04/2022 1500   HGB 9.4 (L) 06/20/2022 1008   HCT 25.8 (L) 09/04/2022 1500   HCT 26.8 (L) 12/26/2015 0539   MCV 80.9 09/04/2022 1500      Cardiac Panel (last 3 results) Recent Labs    09/04/22 1500  CKTOTAL 32*    .car BNP (last 3 results) Recent Labs    11/08/21 0937 11/29/21 0244 02/25/22 2145  BNP 217.8* 232.1* 178.7*      DM  labs:  HbA1C: Recent Labs    09/21/21 1500  HGBA1C 4.9       CBG (last 3)  No results for input(s): "GLUCAP" in the last 72 hours.        Cultures:    Component Value Date/Time   SDES BLOOD LEFT HAND 12/01/2021 0909   SPECREQUEST  12/01/2021 0909    BOTTLES DRAWN AEROBIC AND ANAEROBIC Blood Culture adequate volume   CULT  12/01/2021 0909    NO GROWTH 5 DAYS Performed at [facility], [place] [address], [place], [place] [address]    REPTSTATUS 12/06/2021 FINAL 12/01/2021 [id number]     Radiological Exams on Admission: DG Chest 2 View  Result Date: 09/04/2022 CLINICAL DATA:  Shortness of breath. EXAM: CHEST - 2 VIEW COMPARISON:   Chest radiographs 02/25/2022, 11/28/2021 FINDINGS: Cardiac silhouette is again moderately enlarged. Mediastinal contours are within normal limits with moderate calcification within the aortic arch. Mild bilateral mid and lower lung interstitial thickening and areas of linear scarring appear unchanged. No pleural effusion or pneumothorax. Moderate multilevel degenerative disc changes of the thoracic spine. IMPRESSION: 1. No acute lung process. 2. Mild bilateral mid and lower lung interstitial thickening and linear densities appear unchanged. This again may represent mild interstitial pulmonary edema and/or scarring. Electronically Signed   By: Yvonne Kendall M.D.   On: 09/04/2022 14:50   _______________________________________________________________________________________________________ Latest  Blood pressure 133/82, pulse 81, temperature 98.6 F (37 C), temperature source Oral, resp. rate (!) 24, weight 104 kg, SpO2 92 %.   Vitals  labs and radiology finding personally reviewed  Review of Systems:    Pertinent positives include:   melena, blood in stool,   Constitutional:  No weight loss, night sweats, Fevers, chills, fatigue, weight loss  HEENT:  No headaches, Difficulty swallowing,Tooth/dental problems,Sore throat,  No sneezing, itching, ear ache, nasal congestion,  post nasal drip,  Cardio-vascular:  No chest pain, Orthopnea, PND, anasarca, dizziness, palpitations.no Bilateral lower extremity swelling  GI:  No heartburn, indigestion, abdominal pain, nausea, vomiting, diarrhea, change in bowel habits, loss of appetite,hematemesis Resp:  no shortness of breath at rest. No dyspnea on exertion, No excess mucus, no productive cough, No non-productive cough, No coughing up of blood.No change in color of mucus.No wheezing. Skin:  no rash or lesions. No jaundice GU:  no dysuria, change in color of urine, no urgency or frequency. No straining to urinate.  No flank pain.  Musculoskeletal:  No  joint pain or no joint swelling. No decreased range of motion. No back pain.  Psych:  No change in mood or affect. No depression or anxiety. No memory loss.  Neuro: no localizing neurological complaints, no tingling, no weakness, no double vision, no gait abnormality, no slurred speech, no confusion  All systems reviewed and apart from [place] all are negative _______________________________________________________________________________________________ Past Medical History:   Past Medical History:  Diagnosis Date   Anemia 12/26/2015   Anxiety    Arthritis    Blood transfusion without reported diagnosis    Chronic pain    resolved per pt 04/12/20   COPD (chronic obstructive pulmonary disease) (HCC)    Coronary artery disease    Depression    Diabetes mellitus without complication (HCC)    type 2   GERD (gastroesophageal reflux disease)    Heart murmur    since birth; Echo 02/18/19: LVEF 0000000, grade 1 diastolic dysfunction, mild AS (mean grad 12 mmHg), trace MR/PR, mild TR, PASP 32 mmHg     Hyperlipemia    Hypertension    PVD (peripheral vascular disease) ([place])    right SFA stent 02/11/17 by Dr. Einar Gip   Sleep apnea    does not use cpap   Wears glasses       Past Surgical History:  Procedure Laterality Date   ABDOMINAL HYSTERECTOMY     CARDIAC CATHETERIZATION     CATARACT EXTRACTION     CESAREAN SECTION     COLONOSCOPY N/A 01/08/2013   Procedure: COLONOSCOPY;  Surgeon: Beryle Beams, MD;  Location: [facility];  Service: Endoscopy;  Laterality: N/A;   COLONOSCOPY N/A 12/28/2015   Procedure: COLONOSCOPY;  Surgeon: Carol Ada, MD;  Location: [place];  Service: Endoscopy;  Laterality: N/A;   COLONOSCOPY WITH PROPOFOL N/A 10/05/2020   Procedure: COLONOSCOPY WITH PROPOFOL;  Surgeon: Carol Ada, MD;  [facility];  Service: Endoscopy;  Laterality: N/A;   ENTEROSCOPY N/A 12/28/2015   Procedure: ENTEROSCOPY;  Surgeon: Carol Ada, MD;  Location: [place];   Service: Endoscopy;  Laterality: N/A;   ENTEROSCOPY N/A 02/20/2018   Procedure: ENTEROSCOPY;  Surgeon: Carol Ada, MD;  [facility];  Service: Endoscopy;  Laterality: N/A;   ENTEROSCOPY N/A 03/27/2018   Procedure: ENTEROSCOPY;  Surgeon: Carol Ada, MD;  [facility];  Service: Endoscopy;  Laterality: N/A;   ENTEROSCOPY N/A 10/05/2020   Procedure: ENTEROSCOPY;  Surgeon: Carol Ada, MD;  [facility];  Service: Endoscopy;  Laterality: N/A;   ENTEROSCOPY N/A 05/11/2021   Procedure: ENTEROSCOPY;  Surgeon: Carol Ada, MD;  [facility];  Service: Endoscopy;  Laterality: N/A;   ENTEROSCOPY N/A 09/23/2021   Procedure: ENTEROSCOPY;  Surgeon: Doran Stabler, MD;  Location: [place];  Service: Gastroenterology;  Laterality: N/A;   ESOPHAGOGASTRODUODENOSCOPY N/A 01/08/2013   Procedure: ESOPHAGOGASTRODUODENOSCOPY (EGD);  Surgeon: Beryle Beams, MD;  Location: [facility];  Service:  Endoscopy;  Laterality: N/A;   EYE SURGERY     GIVENS CAPSULE STUDY N/A 09/24/2021   Procedure: GIVENS CAPSULE STUDY;  Surgeon: Carol Ada, MD;  Location: [place];  Service: Gastroenterology;  Laterality: N/A;   HAND SURGERY     HEMOSTASIS CLIP PLACEMENT  05/11/2021   Procedure: HEMOSTASIS CLIP PLACEMENT;  Surgeon: Carol Ada, MD;  [facility];  Service: Endoscopy;;   HOT HEMOSTASIS N/A 12/28/2015   Procedure: HOT HEMOSTASIS (ARGON PLASMA COAGULATION/BICAP);  Surgeon: Carol Ada, MD;  Location: [facility] ENDOSCOPY;  Service: Endoscopy;  Laterality: N/A;   HOT HEMOSTASIS N/A 02/20/2018   Procedure: HOT HEMOSTASIS (ARGON PLASMA COAGULATION/BICAP);  Surgeon: Carol Ada, MD;  Location: [facility];  Service: Endoscopy;  Laterality: N/A;   HOT HEMOSTASIS N/A 03/27/2018   Procedure: HOT HEMOSTASIS (ARGON PLASMA COAGULATION/BICAP);  Surgeon: Carol Ada, MD;  Location: [facility];  Service: Endoscopy;  Laterality: N/A;   HOT HEMOSTASIS N/A 10/05/2020    Procedure: HOT HEMOSTASIS (ARGON PLASMA COAGULATION/BICAP);  Surgeon: Carol Ada, MD;  Location: [facility];  Service: Endoscopy;  Laterality: N/A;   HOT HEMOSTASIS N/A 05/11/2021   Procedure: HOT HEMOSTASIS (ARGON PLASMA COAGULATION/BICAP);  Surgeon: Carol Ada, MD;  Location: [facility];  Service: Endoscopy;  Laterality: N/A;   LEFT HEART CATHETERIZATION WITH CORONARY ANGIOGRAM N/A 03/09/2013   Procedure: LEFT HEART CATHETERIZATION WITH CORONARY ANGIOGRAM;  Surgeon: Laverda Page, MD;  Location: [facility] CATH LAB;  Service: Cardiovascular;  Laterality: N/A;   LOWER EXTREMITY ANGIOGRAM N/A 12/29/2012   Procedure: LOWER EXTREMITY ANGIOGRAM;  Surgeon: Laverda Page, MD;  Location: [facility] CATH LAB;  Service: Cardiovascular;  Laterality: N/A;   LOWER EXTREMITY ANGIOGRAM N/A 10/05/2013   Procedure: LOWER EXTREMITY ANGIOGRAM;  Surgeon: Laverda Page, MD;  Location: [facility] CATH LAB;  Service: Cardiovascular;  Laterality: N/A;   LOWER EXTREMITY ANGIOGRAPHY N/A 02/11/2017   Procedure: Lower Extremity Angiography;  Surgeon: Adrian Prows, MD;  Location: [place] CV LAB;  Service: Cardiovascular;  Laterality: N/A;   PERIPHERAL VASCULAR BALLOON ANGIOPLASTY  02/11/2017   Procedure: PERIPHERAL VASCULAR BALLOON ANGIOPLASTY;  Surgeon: Adrian Prows, MD;  Location: [place] CV LAB;  Service: Cardiovascular;;  Right SFA   PERIPHERAL VASCULAR INTERVENTION Right 02/11/2017   Procedure: PERIPHERAL VASCULAR INTERVENTION;  Surgeon: Adrian Prows, MD;  Location: [place] CV LAB;  Service: Cardiovascular;  Laterality: Right;  Rt SFA   POLYPECTOMY  10/05/2020   Procedure: POLYPECTOMY;  Surgeon: Carol Ada, MD;  [facility];  Service: Endoscopy;;   stent in right leg  12/29/2012   for blood clot, Dr. Nadyne Coombes   TOTAL KNEE ARTHROPLASTY Left 04/18/2020   TOTAL KNEE ARTHROPLASTY Left 04/18/2020   Procedure: LEFT TOTAL KNEE ARTHROPLASTY;  Surgeon: Meredith Pel, MD;  Location: [place];  Service: Orthopedics;   Laterality: Left;   TYMPANOMASTOIDECTOMY Left 03/08/2014   Procedure: TYMPANOMASTOIDECTOMY LEFT;  Surgeon: Ascencion Dike, MD;  Location: [place];  Service: ENT;  Laterality: Left;   TYMPANOMASTOIDECTOMY  2015   UTERINE FIBROID SURGERY      Social History:  Ambulatory   independently      reports that she quit smoking about 15 months ago. Her smoking use included cigarettes. She started smoking about 56 years ago. She has a 32.40 pack-year smoking history. She has never used smokeless tobacco. She reports current alcohol use. She reports current drug use. Drugs: Marijuana and Cocaine.   Family History:   Family History  Problem Relation Age of Onset   Diabetes  Mother    Hypertension Mother    Heart disease Mother    Heart disease Father    Hypertension Father    Hypertension Sister    Hypertension Brother    Diabetes Brother    Hypertension Sister    Hypertension Brother    Diabetes Brother    Hypertension Brother    Hypertension Brother    Hypertension Brother    ______________________________________________________________________________________________ Allergies: No Known Allergies   Prior to Admission medications   Medication Sig Start Date End Date Taking? Authorizing Provider  acetaminophen (TYLENOL) 500 MG tablet Take 500 mg by mouth every 6 (six) hours as needed.    [provider]  albuterol (PROVENTIL) (2.5 MG/3ML) 0.083% nebulizer solution Take 2.5 mg by nebulization every 6 (six) hours as needed for wheezing or shortness of breath.    [provider]  albuterol (VENTOLIN HFA) 108 (90 Base) MCG/ACT inhaler Inhale 1-2 puffs into the lungs every 6 (six) hours as needed for wheezing or shortness of breath. Patient taking differently: Inhale 2 puffs into the lungs every 6 (six) hours as needed for wheezing or shortness of breath. 11/13/21   Adhikari, Tamsen Meek, MD  ASPIRIN LOW DOSE 81 MG EC tablet TAKE 1 TABLET(81 MG) BY MOUTH  DAILY Patient taking differently: Take 81 mg by mouth daily. 06/30/20   Magnant, Charles L, PA-C  atorvastatin (LIPITOR) 80 MG tablet Take 80 mg by mouth daily. 08/08/20   [provider]  Cholecalciferol (VITAMIN D-3 PO) Take 1 capsule by mouth 2 (two) times daily.    [provider]  cilostazol (PLETAL) 50 MG tablet Take 50 mg by mouth 2 (two) times daily. 01/24/22   [provider]  ezetimibe (ZETIA) 10 MG tablet TAKE 1 TABLET BY MOUTH DAILY Patient taking differently: Take 10 mg by mouth daily. 10/15/21   [name], [name], DO  ferrous sulfate 325 (65 FE) MG tablet Take 325 mg by mouth daily.    [provider]  fluticasone (FLONASE) 50 MCG/ACT nasal spray Place 1 spray into both nostrils daily as needed for allergies. 08/14/21   [provider]  Fluticasone-Umeclidin-Vilant (TRELEGY ELLIPTA) 100-62.5-25 MCG/ACT AEPB Inhale 1 puff into the lungs daily. 02/13/22   Icard, Octavio Graves, DO  furosemide (LASIX) 40 MG tablet Take 1 tablet (40 mg total) by mouth 2 (two) times daily. 03/01/22 04/30/22  Shelly Coss, MD  gabapentin (NEURONTIN) 600 MG tablet Take 1 tablet (600 mg total) by mouth at bedtime. 10/02/17   Hyatt, Max T, DPM  ipratropium-albuterol (DUONEB) 0.5-2.5 (3) MG/3ML SOLN Take 3 mLs by nebulization 3 (three) times daily. Patient taking differently: Take 3 mLs by nebulization 3 (three) times daily as needed (wheezing). 11/13/21 02/27/23  Shelly Coss, MD  LINZESS 145 MCG CAPS capsule Take 145 mcg by mouth daily as needed (Constipation). 02/11/22   [provider]  metFORMIN (GLUCOPHAGE) 500 MG tablet Take 0.5 tablets (250 mg total) by mouth 2 (two) times daily with a meal. 02/13/17   Adrian Prows, MD  metoprolol succinate (TOPROL XL) 25 MG 24 hr tablet Take 1 tablet (25 mg total) by mouth daily. 08/16/22   [name], [name], DO  MYRBETRIQ 25 MG TB24 tablet Take 25 mg by mouth daily. 08/10/21   [provider]  nitroGLYCERIN (NITROSTAT) 0.4 MG SL tablet  Place 1 tablet (0.4 mg total) under the tongue every 5 (five) minutes as needed for chest pain. 03/21/22 06/19/22  [name], [name], DO  OXYGEN Inhale 3 L into the lungs continuous.  [provider]  pantoprazole (PROTONIX) 40 MG tablet Take 1 tablet (40 mg total) by mouth 2 (two) times daily. Patient taking differently: Take 40 mg by mouth daily. 09/25/21   Thurnell Lose, MD  potassium chloride SA (KLOR-CON M) 20 MEQ tablet Take 1 tablet (20 mEq total) by mouth daily. 03/02/22   Shelly Coss, MD  sacubitril-valsartan (ENTRESTO) 24-26 MG Take 1 tablet by mouth 2 (two) times daily. 03/25/22   [name], [name], DO  spironolactone (ALDACTONE) 50 MG tablet TAKE 1 TABLET BY MOUTH DAILY Patient taking differently: Take 50 mg by mouth daily. 12/17/21   Rex Kras, DO    ___________________________________________________________________________________________________ Physical Exam:    09/04/2022    6:30 PM 09/04/2022    6:00 PM 09/04/2022    5:58 PM  Vitals with BMI  Systolic [id number]   Diastolic 82 54   Pulse 81 79 76     1. General:  in No  Acute distress    Chronically ill  -appearing 2. Psychological: Alert and   Oriented 3. Head/ENT Dry Mucous Membranes                          Head Non traumatic, neck supple                        Poor Dentition 4. SKIN:  decreased Skin turgor,  Skin clean Dry and intact no rash 5. Heart: Regular rate and rhythm no  Murmur, no Rub or gallop 6. Lungs: no wheezes or crackles   7. Abdomen: Soft,  non-tender, Non distended bowel sounds present 8. Lower extremities: no clubbing, cyanosis, no  edema 9. Neurologically Grossly intact, moving all 4 extremities equally   10. MSK: Normal range of motion    Chart has been reviewed  ______________________________________________________________________________________________  Assessment/Plan 72 y.o. female with medical history significant of COPD, tobacco abuse, diabetes, hypertension, depression,  CAD, GERD, dyslipidemia, peripheral vascular disease, OSA, and anxiety, chronic anemia requiring blood transfusion and iron infusions.  Admitted for   Anemia, unspecified type  Gastrointestinal hemorrhage, unspecified gastrointestinal hemorrhage type    Present on Admission:  Symptomatic anemia  Acute on chronic blood loss anemia  Coronary artery disease  PAD (peripheral artery disease) (HCC)  COPD (chronic obstructive pulmonary disease) (HCC)  Chronic diastolic CHF (congestive heart failure) (HCC)  Obesity, Class III, BMI 40-49.9 (morbid obesity) ([place])     Acute on chronic blood loss anemia Transfuse 1 unit and obtain serial CBC Gi is aware will see in AM  Coronary artery disease Hold aspitin, continue lipitor  80 mg po q dayand zetia  PAD (peripheral artery disease) ([place]) Hold pletal for now  COPD (chronic obstructive pulmonary disease) (HCC) Chronic stable continue home medications  Type 2 diabetes mellitus without complication, without long-term current use of insulin (HCC) Check hemoglobin A1c order sliding scale hold metformin  Chronic diastolic CHF (congestive heart failure) (HCC) Slightly on the drier side right now given decreased p.o. intake will hold Entresto spironolactone and Lasix and will administer Lasix IV as needed if patient appears to have fluid overload  Obesity, Class III, BMI 40-49.9 (morbid obesity) ([place]) Will need follow-up as an outpatient nutrition clinic   Other plan as per orders.  DVT prophylaxis:  SCD     Code Status:    Code Status: Prior FULL CODE as per patient  I had personally discussed CODE STATUS with patient  Family Communication:   Family not at  Bedside    Disposition Plan:       To home once workup is complete and patient is stable   Following barriers for discharge:                            Electrolytes corrected                               Anemia corrected                                                          Will need to be able to tolerate PO                                                       Will need consultants to evaluate patient prior to discharge     Consults called: Gi Mann/Hung  Admission status:  ED Disposition     ED Disposition  Admit   Condition  --   [place]: [facility] [[id number]]  Level of Care: Telemetry [5]  Admit to tele based on following criteria: Other see comments  Comments: anemia  May place patient in observation at [facility] or [place] if equivalent level of care is available:: No  Covid Evaluation: Asymptomatic - no recent exposure (last 10 days) testing not required  Diagnosis: Symptomatic anemia [id number]  Admitting Physician: Toy Baker [[id number]]  Attending Physician: Toy Baker [[id number]]           Obs     Level of care     tele  For 12H   Trey Gulbranson [place] 09/04/2022, 8:49 PM    [facility]     after 2 AM please page floor coverage PA If 7AM-7PM, please contact the day team taking care of the patient using [email or web address]   Patient was evaluated in the context of the global COVID-19 pandemic, which necessitated consideration that the patient might be at risk for infection with the SARS-CoV-2 virus that causes COVID-19. Institutional protocols and algorithms that pertain to the evaluation of patients at risk for COVID-19 are in a state of rapid change based on information released by regulatory bodies including the CDC and federal and state organizations. These policies and algorithms were followed during the patient's care.

## 2022-09-04 NOTE — ED Notes (Signed)
ED TO INPATIENT HANDOFF REPORT  ED Nurse Name and Phone #: Renette Butters Name/Age/Gender [name] 72 y.o. female Room/Bed: WA04/WA04  Code Status   Code Status: Prior  Home/SNF/Other Home Patient oriented to: self, place, time, and situation Is this baseline? Yes   Triage Complete: Triage complete  Chief Complaint Symptomatic anemia [D64.9]  Triage Note Referred d/t hemoglobin 6.5.  pt reports headache, dizziness, sob x1 month.  Denies cp.  Pt denies rectal bleeding and blood thinner usage.  Denies abd pain  Hx blood transfusion last in January    Allergies No Known Allergies  Level of Care/Admitting Diagnosis ED Disposition     ED Disposition  Admit   Condition  --   Comment  Hospital Area: [facility] [[id number]]  Level of Care: Telemetry [5]  Admit to tele based on following criteria: Other see comments  Comments: anemia  May place patient in observation at [facility] or [facility] if equivalent level of care is available:: No  Covid Evaluation: Asymptomatic - no recent exposure (last 10 days) testing not required  Diagnosis: Symptomatic anemia [id number]  Admitting Physician: Toy Baker [[id number]]  Attending Physician: Toy Baker [[id number]]          B Medical/Surgery History Past Medical History:  Diagnosis Date   Anemia 12/26/2015   Anxiety    Arthritis    Blood transfusion without reported diagnosis    Chronic pain    resolved per pt 04/12/20   COPD (chronic obstructive pulmonary disease) ([place])    Coronary artery disease    Depression    Diabetes mellitus without complication (HCC)    type 2   GERD (gastroesophageal reflux disease)    Heart murmur    since birth; Echo 02/18/19: LVEF 0000000, grade 1 diastolic dysfunction, mild AS (mean grad 12 mmHg), trace MR/PR, mild TR, PASP 32 mmHg     Hyperlipemia    Hypertension    PVD (peripheral vascular disease) ([place])    right SFA stent 02/11/17 by Dr. Einar Gip    Sleep apnea    does not use cpap   Wears glasses    Past Surgical History:  Procedure Laterality Date   ABDOMINAL HYSTERECTOMY     CARDIAC CATHETERIZATION     CATARACT EXTRACTION     CESAREAN SECTION     COLONOSCOPY N/A 01/08/2013   Procedure: COLONOSCOPY;  Surgeon: Beryle Beams, MD;  Location: [facility];  Service: Endoscopy;  Laterality: N/A;   COLONOSCOPY N/A 12/28/2015   Procedure: COLONOSCOPY;  Surgeon: Carol Ada, MD;  Location: [facility];  Service: Endoscopy;  Laterality: N/A;   COLONOSCOPY WITH PROPOFOL N/A 10/05/2020   Procedure: COLONOSCOPY WITH PROPOFOL;  Surgeon: Carol Ada, MD;  [facility];  Service: Endoscopy;  Laterality: N/A;   ENTEROSCOPY N/A 12/28/2015   Procedure: ENTEROSCOPY;  Surgeon: Carol Ada, MD;  Location: [facility];  Service: Endoscopy;  Laterality: N/A;   ENTEROSCOPY N/A 02/20/2018   Procedure: ENTEROSCOPY;  Surgeon: Carol Ada, MD;  [facility];  Service: Endoscopy;  Laterality: N/A;   ENTEROSCOPY N/A 03/27/2018   Procedure: ENTEROSCOPY;  Surgeon: Carol Ada, MD;  [facility];  Service: Endoscopy;  Laterality: N/A;   ENTEROSCOPY N/A 10/05/2020   Procedure: ENTEROSCOPY;  Surgeon: Carol Ada, MD;  [facility];  Service: Endoscopy;  Laterality: N/A;   ENTEROSCOPY N/A 05/11/2021   Procedure: ENTEROSCOPY;  Surgeon: Carol Ada, MD;  [facility];  Service: Endoscopy;  Laterality: N/A;   ENTEROSCOPY  N/A 09/23/2021   Procedure: ENTEROSCOPY;  Surgeon: Doran Stabler, MD;  Location: [facility];  Service: Gastroenterology;  Laterality: N/A;   ESOPHAGOGASTRODUODENOSCOPY N/A 01/08/2013   Procedure: ESOPHAGOGASTRODUODENOSCOPY (EGD);  Surgeon: Beryle Beams, MD;  Location[facility];  Service: Endoscopy;  Laterality: N/A;   EYE SURGERY     GIVENS CAPSULE STUDY N/A 09/24/2021   Procedure: GIVENS CAPSULE STUDY;  Surgeon: Carol Ada, MD;  Location: [facility];  Service: Gastroenterology;   Laterality: N/A;   HAND SURGERY     HEMOSTASIS CLIP PLACEMENT  05/11/2021   Procedure: HEMOSTASIS CLIP PLACEMENT;  Surgeon: Carol Ada, MD;  [facility];  Service: Endoscopy;;   HOT HEMOSTASIS N/A 12/28/2015   Procedure: HOT HEMOSTASIS (ARGON PLASMA COAGULATION/BICAP);  Surgeon: Carol Ada, MD;  Location[facility] END[facility]COPY;  Service: Endoscopy;  Laterality: N/A;   HOT HEMOSTASIS N/A 02/20/2018   Procedure: HOT HEMOSTASIS (ARGON PLASMA COAGULATION/BICAP);  Surgeon: Carol Ada, MD;  Location[facility];  Service: Endoscopy;  Laterality: N/A;   HOT HEMOSTASIS N/A 03/27/2018   Procedure: HOT HEMOSTASIS (ARGON PLASMA COAGULATION/BICAP);  Surgeon: Carol Ada, MD;  Location[facility];  Service: Endoscopy;  Laterality: N/A;   HOT HEMOSTASIS N/A 10/05/2020   Procedure: HOT HEMOSTASIS (ARGON PLASMA COAGULATION/BICAP);  Surgeon: Carol Ada, MD;  Location[facility];  Service: Endoscopy;  Laterality: N/A;   HOT HEMOSTASIS N/A 05/11/2021   Procedure: HOT HEMOSTASIS (ARGON PLASMA COAGULATION/BICAP);  Surgeon: Carol Ada, MD;  Location: [facility];  Service: Endoscopy;  Laterality: N/A;   LEFT HEART CATHETERIZATION WITH CORONARY ANGIOGRAM N/A 03/09/2013   Procedure: LEFT HEART CATHETERIZATION WITH CORONARY ANGIOGRAM;  Surgeon: Laverda Page, MD;  [facility] LAB;  Service: Cardiovascular;  Laterality: N/A;   LOWER EXTREMITY ANGIOGRAM N/A 12/29/2012   Procedure: LOWER EXTREMITY ANGIOGRAM;  Surgeon: Laverda Page, MD;  Location: [facility] LAB;  Service: Cardiovascular;  Laterality: N/A;   LOWER EXTREMITY ANGIOGRAM N/A 10/05/2013   Procedure: LOWER EXTREMITY ANGIOGRAM;  Surgeon: Laverda Page, MD;  Location: [facility] CATH LAB;  Service: Cardiovascular;  Laterality: N/A;   LOWER EXTREMITY ANGIOGRAPHY N/A 02/11/2017   Procedure: Lower Extremity Angiography;  Surgeon: Adrian Prows, MD;  Location: [facility] CV LAB;  Service: Cardiovascular;  Laterality: N/A;   PERIPHERAL VASCULAR  BALLOON ANGIOPLASTY  02/11/2017   Procedure: PERIPHERAL VASCULAR BALLOON ANGIOPLASTY;  Surgeon: Adrian Prows, MD;  Location: [place] CV LAB;  Service: Cardiovascular;;  Right SFA   PERIPHERAL VASCULAR INTERVENTION Right 02/11/2017   Procedure: PERIPHERAL VASCULAR INTERVENTION;  Surgeon: Adrian Prows, MD;  Location: [place] CV LAB;  Service: Cardiovascular;  Laterality: Right;  Rt SFA   POLYPECTOMY  10/05/2020   Procedure: POLYPECTOMY;  Surgeon: Carol Ada, MD;  [facility];  Service: Endoscopy;;   stent in right leg  12/29/2012   for blood clot, Dr. Nadyne Coombes   TOTAL KNEE ARTHROPLASTY Left 04/18/2020   TOTAL KNEE ARTHROPLASTY Left 04/18/2020   Procedure: LEFT TOTAL KNEE ARTHROPLASTY;  Surgeon: Meredith Pel, MD;  Location: [facility];  Service: Orthopedics;  Laterality: Left;   TYMPANOMASTOIDECTOMY Left 03/08/2014   Procedure: TYMPANOMASTOIDECTOMY LEFT;  Surgeon: Ascencion Dike, MD;  Location: [facility];  Service: ENT;  Laterality: Left;   TYMPANOMASTOIDECTOMY  2015   UTERINE FIBROID SURGERY       A IV Location/Drains/Wounds Patient Lines/Drains/Airways Status     Active Line/Drains/Airways     Name Placement date Placement time Site Days   Peripheral IV 09/04/22 Right Antecubital 09/04/22  1500  Antecubital  less than 1            Intake/Output Last 24 hours No intake or output data in the 24 hours ending 09/04/22 1947  Labs/Imaging Results for orders placed or performed during the hospital encounter of 09/04/22 (from the past 48 hour(s))  CBC     Status: Abnormal   Collection Time: 09/04/22  3:00 PM  Result Value Ref Range   WBC 5.3 4.0 - 10.5 K/uL   RBC 3.19 (L) 3.87 - 5.11 MIL/uL   Hemoglobin 7.0 (L) 12.0 - 15.0 g/dL   HCT 25.8 (L) 36.0 - 46.0 %   MCV 80.9 80.0 - 100.0 fL   MCH 21.9 (L) 26.0 - 34.0 pg   MCHC 27.1 (L) 30.0 - 36.0 g/dL   RDW 18.8 (H) 11.5 - 15.5 %   Platelets 574 (H) 150 - 400 K/uL   nRBC 0.0 0.0 - 0.2 %    Comment: Performed  at [facility], [place] [address], [place], [place] [address]  Comprehensive metabolic panel     Status: Abnormal   Collection Time: 09/04/22  3:00 PM  Result Value Ref Range   Sodium 138 135 - 145 mmol/L   Potassium 3.7 3.5 - 5.1 mmol/L   Chloride 104 98 - 111 mmol/L   CO2 27 22 - 32 mmol/L   Glucose, Bld 120 (H) 70 - 99 mg/dL    Comment: Glucose reference range applies only to samples taken after fasting for at least 8 hours.   BUN 13 8 - 23 mg/dL   Creatinine, Ser 0.49 0.44 - 1.00 mg/dL   Calcium 8.9 8.9 - 10.3 mg/dL   Total Protein 7.3 6.5 - 8.1 g/dL   Albumin 3.7 3.5 - 5.0 g/dL   AST 14 (L) 15 - 41 U/L   ALT 8 0 - 44 U/L   Alkaline Phosphatase 68 38 - 126 U/L   Total Bilirubin 0.4 0.3 - 1.2 mg/dL   GFR, Estimated >60 >60 mL/min    Comment: (NOTE) Calculated using the CKD-EPI Creatinine Equation (2021)    Anion gap 7 5 - 15    Comment: Performed at [facility], [place] [address], [place], [place] [address]  Reticulocytes     Status: Abnormal   Collection Time: 09/04/22  3:00 PM  Result Value Ref Range   Retic Ct Pct 2.4 0.4 - 3.1 %   RBC. 3.09 (L) 3.87 - 5.11 MIL/uL   Retic Count, Absolute 75.4 19.0 - 186.0 K/uL   Immature Retic Fract 28.4 (H) 2.3 - 15.9 %    Comment: Performed at [facility], [place] [address] [place], [place] [address]  Type and screen [place]     Status: None   Collection Time: 09/04/22  4:34 PM  Result Value Ref Range   ABO/RH(D) O POS    Antibody Screen NEG    Sample Expiration      09/07/2022,2359 Performed at [facility], [place] [address], [place], [place] [address]   POC occult blood, ED     Status: Abnormal   Collection Time: 09/04/22  5:53 PM  Result Value Ref Range   Fecal Occult Bld POSITIVE (A) NEGATIVE   DG Chest 2 View  Result Date: 09/04/2022 CLINICAL DATA:  Shortness of breath. EXAM: CHEST - 2 VIEW COMPARISON:  Chest radiographs  02/25/2022, 11/28/2021 FINDINGS: Cardiac silhouette is again moderately enlarged. Mediastinal contours are within normal limits with  moderate calcification within the aortic arch. Mild bilateral mid and lower lung interstitial thickening and areas of linear scarring appear unchanged. No pleural effusion or pneumothorax. Moderate multilevel degenerative disc changes of the thoracic spine. IMPRESSION: 1. No acute lung process. 2. Mild bilateral mid and lower lung interstitial thickening and linear densities appear unchanged. This again may represent mild interstitial pulmonary edema and/or scarring. Electronically Signed   By: Yvonne Kendall M.D.   On: 09/04/2022 14:50    Pending Labs Unresulted Labs (From admission, onward)     Start     Ordered   09/05/22 0500  Prealbumin  Tomorrow morning,   R        09/04/22 1914   09/04/22 2000  Protime-INR  Once,   R        09/04/22 2000   09/04/22 2000  TSH  Once,   R        09/04/22 2000   09/04/22 1915  CK  Add-on,   AD        09/04/22 1914   09/04/22 1915  Magnesium  Add-on,   AD        09/04/22 1914   09/04/22 1915  Phosphorus  Add-on,   AD        09/04/22 1914   09/04/22 1915  Vitamin B12  (Anemia Panel (PNL))  Once,   R        09/04/22 1914   09/04/22 1915  Folate  (Anemia Panel (PNL))  Once,   R        09/04/22 1914   09/04/22 1915  Iron and TIBC  (Anemia Panel (PNL))  Once,   R        09/04/22 1914   09/04/22 1915  Ferritin  (Anemia Panel (PNL))  Once,   R        09/04/22 1914   09/04/22 1802  Prepare RBC (crossmatch)  (Blood Administration Adult)  Once,   R       Question Answer Comment  # of Units 2 units   Transfusion Indications Hemoglobin < 7 gm/dL and symptomatic   Number of Units to Keep Ahead NO units ahead   If emergent release call blood bank Not emergent release      09/04/22 1801            Vitals/Pain Today's Vitals   09/04/22 1800 09/04/22 1815 09/04/22 1830 09/04/22 1839  BP: (!) 119/54 (!) 124/92 133/82    Pulse: 79 88 81   Resp: 20 19 (!) 24   Temp:    98.6 F (37 C)  TempSrc:    Oral  SpO2: 96% 93% 92%   Weight:      PainSc:        Isolation Precautions No active isolations  Medications Medications  0.9 %  sodium chloride infusion (Manually program via Guardrails IV Fluids) (has no administration in time range)    Mobility walks     Focused Assessments     R Recommendations: See Admitting Provider Note  Report given to:   Additional Notes:

## 2022-09-04 NOTE — ED Triage Notes (Signed)
Referred d/t hemoglobin 6.5.  pt reports headache, dizziness, sob x1 month.  Denies cp.  Pt denies rectal bleeding and blood thinner usage.  Denies abd pain  Hx blood transfusion last in January

## 2022-09-04 NOTE — Assessment & Plan Note (Signed)
Chronic stable continue home medications ?

## 2022-09-05 DIAGNOSIS — K922 Gastrointestinal hemorrhage, unspecified: Secondary | ICD-10-CM | POA: Diagnosis not present

## 2022-09-05 DIAGNOSIS — K5520 Angiodysplasia of colon without hemorrhage: Secondary | ICD-10-CM | POA: Diagnosis not present

## 2022-09-05 DIAGNOSIS — D649 Anemia, unspecified: Secondary | ICD-10-CM | POA: Diagnosis not present

## 2022-09-05 LAB — CBC
HCT: 30 % — ABNORMAL LOW (ref 36.0–46.0)
HCT: 31.3 % — ABNORMAL LOW (ref 36.0–46.0)
Hemoglobin: 8.7 g/dL — ABNORMAL LOW (ref 12.0–15.0)
Hemoglobin: 8.9 g/dL — ABNORMAL LOW (ref 12.0–15.0)
MCH: 23.8 pg — ABNORMAL LOW (ref 26.0–34.0)
MCH: 23.5 pg — ABNORMAL LOW (ref 26.0–34.0)
MCHC: 29 g/dL — ABNORMAL LOW (ref 30.0–36.0)
MCHC: 28.4 g/dL — ABNORMAL LOW (ref 30.0–36.0)
MCV: 82 fL (ref 80.0–100.0)
MCV: 82.8 fL (ref 80.0–100.0)
Platelets: 482 10*3/uL — ABNORMAL HIGH (ref 150–400)
Platelets: 573 10*3/uL — ABNORMAL HIGH (ref 150–400)
RBC: 3.66 MIL/uL — ABNORMAL LOW (ref 3.87–5.11)
RBC: 3.78 MIL/uL — ABNORMAL LOW (ref 3.87–5.11)
RDW: 17.6 % — ABNORMAL HIGH (ref 11.5–15.5)
RDW: 17.7 % — ABNORMAL HIGH (ref 11.5–15.5)
WBC: 4.6 10*3/uL (ref 4.0–10.5)
WBC: 4 10*3/uL (ref 4.0–10.5)
nRBC: 0 % (ref 0.0–0.2)
nRBC: 0 % (ref 0.0–0.2)

## 2022-09-05 LAB — COMPREHENSIVE METABOLIC PANEL
ALT: 7 U/L (ref 0–44)
AST: 10 U/L — ABNORMAL LOW (ref 15–41)
Albumin: 3.3 g/dL — ABNORMAL LOW (ref 3.5–5.0)
Alkaline Phosphatase: 69 U/L (ref 38–126)
Anion gap: 5 (ref 5–15)
BUN: 10 mg/dL (ref 8–23)
CO2: 27 mmol/L (ref 22–32)
Calcium: 8.5 mg/dL — ABNORMAL LOW (ref 8.9–10.3)
Chloride: 104 mmol/L (ref 98–111)
Creatinine, Ser: 0.63 mg/dL (ref 0.44–1.00)
GFR, Estimated: >60 mL/min (ref >60)
Glucose, Bld: 107 mg/dL — ABNORMAL HIGH (ref 70–99)
Potassium: 3.8 mmol/L (ref 3.5–5.1)
Sodium: 136 mmol/L (ref 135–145)
Total Bilirubin: 0.7 mg/dL (ref 0.3–1.2)
Total Protein: 6.8 g/dL (ref 6.5–8.1)

## 2022-09-05 LAB — GLUCOSE, CAPILLARY
Glucose-Capillary: 102 mg/dL — ABNORMAL HIGH (ref 70–99)
Glucose-Capillary: 115 mg/dL — ABNORMAL HIGH (ref 70–99)
Glucose-Capillary: 146 mg/dL — ABNORMAL HIGH (ref 70–99)
Glucose-Capillary: 172 mg/dL — ABNORMAL HIGH (ref 70–99)
Glucose-Capillary: 86 mg/dL (ref 70–99)
Glucose-Capillary: 199 mg/dL — ABNORMAL HIGH (ref 70–99)

## 2022-09-05 LAB — PREALBUMIN: Prealbumin: 20 mg/dL (ref 18–38)

## 2022-09-05 MED ORDER — SODIUM CHLORIDE 0.9 % IV SOLN
250.0000 mg | Freq: Every day | INTRAVENOUS | Status: AC
  Administered 2022-09-05 – 2022-09-06 (×2): 250 mg via INTRAVENOUS
  Filled 2022-09-05 (×2): qty 20

## 2022-09-05 MED ORDER — OCTREOTIDE ACETATE 50 MCG/ML IJ SOLN
100.0000 ug | Freq: Three times a day (TID) | INTRAMUSCULAR | Status: DC
  Filled 2022-09-05 (×2): qty 2

## 2022-09-05 MED ORDER — OCTREOTIDE ACETATE 20 MG IM KIT: 10.0000 mg | PACK | Freq: Once | INTRAMUSCULAR | Status: DC

## 2022-09-05 MED ORDER — OCTREOTIDE ACETATE 100 MCG/ML IJ SOLN
100.0000 ug | Freq: Three times a day (TID) | INTRAMUSCULAR | Status: DC
  Administered 2022-09-05 – 2022-09-07 (×6): 100 ug via SUBCUTANEOUS
  Filled 2022-09-05 (×7): qty 1

## 2022-09-05 MED ORDER — ADULT MULTIVITAMIN W/MINERALS CH
1.0000 | ORAL_TABLET | Freq: Every day | ORAL | Status: DC
  Administered 2022-09-05 – 2022-09-07 (×3): 1 via ORAL
  Filled 2022-09-05 (×3): qty 1

## 2022-09-05 NOTE — Progress Notes (Signed)
Pt tolerated diet change this shift. Pt has had no c/o N/V, nor pain throughout the shift. Pt ambulates with no assistance. Reinforced Dr Sherral Hammers octreotide education to pt. Educated pt on medication and self administration. Pt verbalized understanding of all. Pt left lying in bed with HOB elevated at pt's discretion. Call light within reach. Bedside table across bed as requested. Cell phone in hand.Door left slightly adjacent with room lights off per pt request as well. Pt denies all other needs at this time.

## 2022-09-05 NOTE — Consult Note (Signed)
Reason for Consult: GI bleed with anemia Referring Physician: [facility]  [name] HPI: This is a 72 year old female with a PMH of bleeding small bowel AVMs, recurrent anemia from the AVMs, and multiple medical problems admitted for a symptomatic anemia.  Upon admission she was noted to have an HGB of 7.0 g/dL.  On 05/29/2022 her HGB was 11.0 g/dL.  She was referred to [facility] for a double balloon enteroscopy, but she was deemed a poor candidate with her cardiac comorbidities.  Octreotide was offered to her, but she declined as self-injections were not an option for her.  However, she was referred to Dr. Earlie Server for the possibility of administering octreotide in the hematology office setting.  Octreotide was mentioned, but she was provided with IV iron infusions.  Her HGB at that time was 9.4 g/dL.  Past Medical History:  Diagnosis Date   Anemia 12/26/2015   Anxiety    Arthritis    Blood transfusion without reported diagnosis    Chronic pain    resolved per pt 04/12/20   COPD (chronic obstructive pulmonary disease) (HCC)    Coronary artery disease    Depression    Diabetes mellitus without complication (HCC)    type 2   GERD (gastroesophageal reflux disease)    Heart murmur    since birth; Echo 02/18/19: LVEF 0000000, grade 1 diastolic dysfunction, mild AS (mean grad 12 mmHg), trace MR/PR, mild TR, PASP 32 mmHg     Hyperlipemia    Hypertension    PVD (peripheral vascular disease) ([place])    right SFA stent 02/11/17 by Dr. Einar Gip   Sleep apnea    does not use cpap   Wears glasses     Past Surgical History:  Procedure Laterality Date   ABDOMINAL HYSTERECTOMY     CARDIAC CATHETERIZATION     CATARACT EXTRACTION     CESAREAN SECTION     COLONOSCOPY N/A 01/08/2013   Procedure: COLONOSCOPY;  Surgeon: Beryle Beams, MD;  Location: [facility];  Service: Endoscopy;  Laterality: N/A;   COLONOSCOPY N/A 12/28/2015   Procedure: COLONOSCOPY;  Surgeon: Carol Ada, MD;  Location: [place];  Service: Endoscopy;  Laterality: N/A;   COLONOSCOPY WITH PROPOFOL N/A 10/05/2020   Procedure: COLONOSCOPY WITH PROPOFOL;  Surgeon: Carol Ada, MD;  [facility];  Service: Endoscopy;  Laterality: N/A;   ENTEROSCOPY N/A 12/28/2015   Procedure: ENTEROSCOPY;  Surgeon: Carol Ada, MD;  Location: [place];  Service: Endoscopy;  Laterality: N/A;   ENTEROSCOPY N/A 02/20/2018   Procedure: ENTEROSCOPY;  Surgeon: Carol Ada, MD;  [facility];  Service: Endoscopy;  Laterality: N/A;   ENTEROSCOPY N/A 03/27/2018   Procedure: ENTEROSCOPY;  Surgeon: Carol Ada, MD;  [facility];  Service: Endoscopy;  Laterality: N/A;   ENTEROSCOPY N/A 10/05/2020   Procedure: ENTEROSCOPY;  Surgeon: Carol Ada, MD;  [facility];  Service: Endoscopy;  Laterality: N/A;   ENTEROSCOPY N/A 05/11/2021   Procedure: ENTEROSCOPY;  Surgeon: Carol Ada, MD;  [facility];  Service: Endoscopy;  Laterality: N/A;   ENTEROSCOPY N/A 09/23/2021   Procedure: ENTEROSCOPY;  Surgeon: Doran Stabler, MD;  Location: [place];  Service: Gastroenterology;  Laterality: N/A;   ESOPHAGOGASTRODUODENOSCOPY N/A 01/08/2013   Procedure: ESOPHAGOGASTRODUODENOSCOPY (EGD);  Surgeon: Beryle Beams, MD;  Location: [facility];  Service: Endoscopy;  Laterality: N/A;   EYE SURGERY     GIVENS CAPSULE STUDY N/A 09/24/2021   Procedure: GIVENS CAPSULE STUDY;  Surgeon: Benson Norway,  Saralyn Pilar, MD;  Location: [place];  Service: Gastroenterology;  Laterality: N/A;   HAND SURGERY     HEMOSTASIS CLIP PLACEMENT  05/11/2021   Procedure: HEMOSTASIS CLIP PLACEMENT;  Surgeon: Carol Ada, MD;  [facility];  Service: Endoscopy;;   HOT HEMOSTASIS N/A 12/28/2015   Procedure: HOT HEMOSTASIS (ARGON PLASMA COAGULATION/BICAP);  Surgeon: Carol Ada, MD;  Location: [facility] ENDOSCOPY;  Service: Endoscopy;  Laterality: N/A;   HOT HEMOSTASIS N/A 02/20/2018   Procedure: HOT HEMOSTASIS (ARGON PLASMA  COAGULATION/BICAP);  Surgeon: Carol Ada, MD;  Location: [facility];  Service: Endoscopy;  Laterality: N/A;   HOT HEMOSTASIS N/A 03/27/2018   Procedure: HOT HEMOSTASIS (ARGON PLASMA COAGULATION/BICAP);  Surgeon: Carol Ada, MD;  Location: [facility];  Service: Endoscopy;  Laterality: N/A;   HOT HEMOSTASIS N/A 10/05/2020   Procedure: HOT HEMOSTASIS (ARGON PLASMA COAGULATION/BICAP);  Surgeon: Carol Ada, MD;  Location: [facility];  Service: Endoscopy;  Laterality: N/A;   HOT HEMOSTASIS N/A 05/11/2021   Procedure: HOT HEMOSTASIS (ARGON PLASMA COAGULATION/BICAP);  Surgeon: Carol Ada, MD;  Location: [facility];  Service: Endoscopy;  Laterality: N/A;   LEFT HEART CATHETERIZATION WITH CORONARY ANGIOGRAM N/A 03/09/2013   Procedure: LEFT HEART CATHETERIZATION WITH CORONARY ANGIOGRAM;  Surgeon: Laverda Page, MD;  Location: [facility] C[facility] LAB;  Service: Cardiovascular;  Laterality: N/A;   LOWER EXTREMITY ANGIOGRAM N/A 12/29/2012   Procedure: LOWER EXTREMITY ANGIOGRAM;  Surgeon: Laverda Page, MD;  Location: [facility] CATH LAB;  Service: Cardiovascular;  Laterality: N/A;   LOWER EXTREMITY ANGIOGRAM N/A 10/05/2013   Procedure: LOWER EXTREMITY ANGIOGRAM;  Surgeon: Laverda Page, MD;  Location: [facility] CATH LAB;  Service: Cardiovascular;  Laterality: N/A;   LOWER EXTREMITY ANGIOGRAPHY N/A 02/11/2017   Procedure: Lower Extremity Angiography;  Surgeon: Adrian Prows, MD;  Location: [place] CV LAB;  Service: Cardiovascular;  Laterality: N/A;   PERIPHERAL VASCULAR BALLOON ANGIOPLASTY  02/11/2017   Procedure: PERIPHERAL VASCULAR BALLOON ANGIOPLASTY;  Surgeon: Adrian Prows, MD;  Location: [place] CV LAB;  Service: Cardiovascular;;  Right SFA   PERIPHERAL VASCULAR INTERVENTION Right 02/11/2017   Procedure: PERIPHERAL VASCULAR INTERVENTION;  Surgeon: Adrian Prows, MD;  Location: [place] CV LAB;  Service: Cardiovascular;  Laterality: Right;  Rt SFA   POLYPECTOMY  10/05/2020   Procedure: POLYPECTOMY;  Surgeon:  Carol Ada, MD;  [facility];  Service: Endoscopy;;   stent in right leg  12/29/2012   for blood clot, Dr. Nadyne Coombes   TOTAL KNEE ARTHROPLASTY Left 04/18/2020   TOTAL KNEE ARTHROPLASTY Left 04/18/2020   Procedure: LEFT TOTAL KNEE ARTHROPLASTY;  Surgeon: Meredith Pel, MD;  Location: [place];  Service: Orthopedics;  Laterality: Left;   TYMPANOMASTOIDECTOMY Left 03/08/2014   Procedure: TYMPANOMASTOIDECTOMY LEFT;  Surgeon: Ascencion Dike, MD;  Location: [place];  Service: ENT;  Laterality: Left;   TYMPANOMASTOIDECTOMY  2015   UTERINE FIBROID SURGERY      Family History  Problem Relation Age of Onset   Diabetes Mother    Hypertension Mother    Heart disease Mother    Heart disease Father    Hypertension Father    Hypertension Sister    Hypertension Brother    Diabetes Brother    Hypertension Sister    Hypertension Brother    Diabetes Brother    Hypertension Brother    Hypertension Brother    Hypertension Brother     Social History:  reports that she quit smoking about 15 months ago. Her smoking use included cigarettes. She started smoking  about 56 years ago. She has a 32.40 pack-year smoking history. She has never used smokeless tobacco. She reports current alcohol use. She reports current drug use. Drugs: Marijuana and Cocaine.  Allergies: No Known Allergies  Medications: Scheduled:  sodium chloride   Intravenous Once   atorvastatin  80 mg Oral Daily   ezetimibe  10 mg Oral Daily   fluticasone furoate-vilanterol  1 puff Inhalation Daily   And   umeclidinium bromide  1 puff Inhalation Daily   gabapentin  600 mg Oral QHS   insulin aspart  0-9 Units Subcutaneous Q4H   ipratropium-albuterol  3 mL Nebulization QID   metoprolol succinate  25 mg Oral Daily   pantoprazole (PROTONIX) IV  40 mg Intravenous Q12H   sodium chloride flush  3 mL Intravenous Q12H   Continuous:  sodium chloride      Results for orders placed or performed during the hospital  encounter of 09/04/22 (from the past 24 hour(s))  Type and screen [place]     Status: None (Preliminary result)   Collection Time: 09/04/22  4:34 PM  Result Value Ref Range   ABO/RH(D) O POS    Antibody Screen NEG    Sample Expiration 09/07/2022,2359    PT AG Type NEGATIVE FOR KELL ANTIGEN    Unit Number [id number]    Blood Component Type RBC LR PHER1    Unit division 00    Status of Unit ISSUED,FINAL    Transfusion Status OK TO TRANSFUSE    Crossmatch Result COMPATIBLE    Donor AG Type NEGATIVE FOR KELL ANTIGEN    Unit Number [id number]    Blood Component Type RBC LR PHER2    Unit division 00    Status of Unit ISSUED    Transfusion Status OK TO TRANSFUSE    Crossmatch Result COMPATIBLE    Donor AG Type NEGATIVE FOR KELL ANTIGEN   POC occult blood, ED     Status: Abnormal   Collection Time: 09/04/22  5:53 PM  Result Value Ref Range   Fecal Occult Bld POSITIVE (A) NEGATIVE  Prepare RBC (crossmatch)     Status: None   Collection Time: 09/04/22  8:55 PM  Result Value Ref Range   Order Confirmation      ORDER PROCESSED BY BLOOD BANK Performed at [facility] [place] [address], [place], [place] [address]   Vitamin B12     Status: None   Collection Time: 09/04/22  8:55 PM  Result Value Ref Range   Vitamin B-12 265 180 - 914 pg/mL  Folate     Status: None   Collection Time: 09/04/22  8:55 PM  Result Value Ref Range   Folate 12.3 >5.9 ng/mL  Iron and TIBC     Status: Abnormal   Collection Time: 09/04/22  8:55 PM  Result Value Ref Range   Iron 31 28 - 170 ug/dL   TIBC 428 250 - 450 ug/dL   Saturation Ratios 7 (L) 10.4 - 31.8 %   UIBC 397 ug/dL  Ferritin     Status: Abnormal   Collection Time: 09/04/22  8:55 PM  Result Value Ref Range   Ferritin 5 (L) 11 - 307 ng/mL  Protime-INR     Status: None   Collection Time: 09/04/22  8:55 PM  Result Value Ref Range   Prothrombin Time 14.2 11.4 - 15.2 seconds   INR 1.1 0.8 - 1.2  TSH      Status: None  Collection Time: 09/04/22  8:55 PM  Result Value Ref Range   TSH 1.995 0.350 - 4.500 uIU/mL  Glucose, capillary     Status: None   Collection Time: 09/04/22 11:36 PM  Result Value Ref Range   Glucose-Capillary 97 70 - 99 mg/dL  Glucose, capillary     Status: Abnormal   Collection Time: 09/05/22  4:06 AM  Result Value Ref Range   Glucose-Capillary 102 (H) 70 - 99 mg/dL  Glucose, capillary     Status: Abnormal   Collection Time: 09/05/22  7:49 AM  Result Value Ref Range   Glucose-Capillary 115 (H) 70 - 99 mg/dL  Prealbumin     Status: None   Collection Time: 09/05/22  8:26 AM  Result Value Ref Range   Prealbumin 20 18 - 38 mg/dL  CBC     Status: Abnormal   Collection Time: 09/05/22  8:26 AM  Result Value Ref Range   WBC 4.6 4.0 - 10.5 K/uL   RBC 3.66 (L) 3.87 - 5.11 MIL/uL   Hemoglobin 8.7 (L) 12.0 - 15.0 g/dL   HCT 30.0 (L) 36.0 - 46.0 %   MCV 82.0 80.0 - 100.0 fL   MCH 23.8 (L) 26.0 - 34.0 pg   MCHC 29.0 (L) 30.0 - 36.0 g/dL   RDW 17.6 (H) 11.5 - 15.5 %   Platelets 482 (H) 150 - 400 K/uL   nRBC 0.0 0.0 - 0.2 %  Comprehensive metabolic panel     Status: Abnormal   Collection Time: 09/05/22  8:26 AM  Result Value Ref Range   Sodium 136 135 - 145 mmol/L   Potassium 3.8 3.5 - 5.1 mmol/L   Chloride 104 98 - 111 mmol/L   CO2 27 22 - 32 mmol/L   Glucose, Bld 107 (H) 70 - 99 mg/dL   BUN 10 8 - 23 mg/dL   Creatinine, Ser 0.63 0.44 - 1.00 mg/dL   Calcium 8.5 (L) 8.9 - 10.3 mg/dL   Total Protein 6.8 6.5 - 8.1 g/dL   Albumin 3.3 (L) 3.5 - 5.0 g/dL   AST 10 (L) 15 - 41 U/L   ALT 7 0 - 44 U/L   Alkaline Phosphatase 69 38 - 126 U/L   Total Bilirubin 0.7 0.3 - 1.2 mg/dL   GFR, Estimated >60 >60 mL/min   Anion gap 5 5 - 15  CBC     Status: Abnormal   Collection Time: 09/05/22 10:49 AM  Result Value Ref Range   WBC 4.0 4.0 - 10.5 K/uL   RBC 3.78 (L) 3.87 - 5.11 MIL/uL   Hemoglobin 8.9 (L) 12.0 - 15.0 g/dL   HCT 31.3 (L) 36.0 - 46.0 %   MCV 82.8 80.0 - 100.0  fL   MCH 23.5 (L) 26.0 - 34.0 pg   MCHC 28.4 (L) 30.0 - 36.0 g/dL   RDW 17.7 (H) 11.5 - 15.5 %   Platelets 573 (H) 150 - 400 K/uL   nRBC 0.0 0.0 - 0.2 %  Glucose, capillary     Status: Abnormal   Collection Time: 09/05/22 11:54 AM  Result Value Ref Range   Glucose-Capillary 172 (H) 70 - 99 mg/dL  Glucose, capillary     Status: Abnormal   Collection Time: 09/05/22 12:07 PM  Result Value Ref Range   Glucose-Capillary 146 (H) 70 - 99 mg/dL     DG Chest 2 View  Result Date: 09/04/2022 CLINICAL DATA:  Shortness of breath. EXAM: CHEST - 2 VIEW COMPARISON:  Chest  radiographs 02/25/2022, 11/28/2021 FINDINGS: Cardiac silhouette is again moderately enlarged. Mediastinal contours are within normal limits with moderate calcification within the aortic arch. Mild bilateral mid and lower lung interstitial thickening and areas of linear scarring appear unchanged. No pleural effusion or pneumothorax. Moderate multilevel degenerative disc changes of the thoracic spine. IMPRESSION: 1. No acute lung process. 2. Mild bilateral mid and lower lung interstitial thickening and linear densities appear unchanged. This again may represent mild interstitial pulmonary edema and/or scarring. Electronically Signed   By: Yvonne Kendall M.D.   On: 09/04/2022 14:50    ROS:  As stated above in the HPI otherwise negative.  Blood pressure 123/64, pulse 62, temperature 98.1 F (36.7 C), temperature source Oral, resp. rate 18, height '4\' 11"'$  (1.499 m), weight 104 kg, SpO2 95 %.    PE: Gen: NAD, Alert and Oriented HEENT:  [place]/AT, EOMI Neck: Supple, no LAD Lungs: CTA Bilaterally CV: RRR without M/G/R ABD: Soft, NTND, +BS Ext: No C/C/E  Assessment/Plan: 1) Recurrent small bowel AVM bleed. 2) Symptomatic anemia. 3) Multiple medical problems.   The patient is in poor health and there are only limited options for her.  Octreotide can help, but she does not want any self-injections.  However, with prolonged discussion, she  will try to administer it to herself.  Thalidomide is a viable option as some studies show a positive benefit with using the medication for refractory GI bleeding.  Plan: 1) Follow HGB and transfuse as necessary. 2) Iron supplementation - oral versus IV. 3) Octreotide 10 mg IM now.  She is agreeable to administering to herself in the future if it is covered by insurance. Sharalyn Lomba D 09/05/2022, 3:08 PM

## 2022-09-05 NOTE — Progress Notes (Addendum)
Initial Nutrition Assessment  DOCUMENTATION CODES:   Morbid obesity  INTERVENTION:  Educate patient on nutritional symptoms of anemia  Educate patient on adequate nutrition  MVI   NUTRITION DIAGNOSIS:   Inadequate oral intake related to altered GI function (GI bleed) as evidenced by per patient/family report, other (comment) (clear liquid diet).   GOAL:   Patient will meet greater than or equal to 90% of their needs   MONITOR:   PO intake, Weight trends, Labs  REASON FOR ASSESSMENT:   Consult Assessment of nutrition requirement/status  ASSESSMENT:    72 y.o. female with PMHx of COPD, tobacco abuse, DM2, HTN, depression, CAD, GERD, HLD, PVD, OSA, anxiety, and chronic anemia presents with dyspnea, black stools for the last several weeks, hgb of 6.5 and admitted for symptomatic anemia.  S/p blood transfusion   Labs: Glu 107 Meds: novolog, protonix, NS Wt: stable/wt gain  Po intake: patient on clear liquid diet (no po intake recorded)  I/O's: +1.5L   Visited patient at bedside who is currently on clear liquid diet. She reports feeling hungry. Patient states she eats when she feels hungry which normally is not until around noon or 1pm each day. She states she will eat a sandwich, cook something like liver and rice, or eat whatever her daughter who lives down the street brings. Patient reports fatigue and some taste changes which could likely be symptoms of her anemia. Patient reports she has been passing dark stool for the past 2 years and has required several blood transfusions. She reports she has a polyp that has been too difficult to remove. Patient denies wt loss or muscle weakness. She states she lives a pretty sedentary life due to the lack or energy she has.   RD to monitor for diet advancement and plans from GI.   NUTRITION - FOCUSED PHYSICAL EXAM:  Flowsheet Row Most Recent Value  Orbital Region No depletion  Upper Arm Region No depletion  Thoracic and Lumbar  Region No depletion  Buccal Region No depletion  Temple Region No depletion  Clavicle Bone Region No depletion  Clavicle and Acromion Bone Region No depletion  Scapular Bone Region Unable to assess  Dorsal Hand No depletion  Patellar Region No depletion  Anterior Thigh Region No depletion  Posterior Calf Region No depletion  Edema (RD Assessment) None  Hair Reviewed  Eyes Reviewed  Mouth Reviewed  Skin Reviewed  Nails Reviewed  [chipped nail paint]       Diet Order:   Diet Order             Diet regular Fluid consistency: Thin  Diet effective now                   EDUCATION NEEDS:   Education needs have been addressed  Skin:  Skin Assessment: Reviewed RN Assessment  Last BM:  3/13  Height:   Ht Readings from Last 1 Encounters:  09/05/22 '4\' 11"'$  (1.499 m)    Weight:   Wt Readings from Last 1 Encounters:  09/04/22 104 kg     BMI:  Body mass index is 46.31 kg/m.  Estimated Nutritional Needs:   Kcal:  1500-1800 kcal/day  Protein:  60-80 g protein/day  Fluid:  >/= 2L    Trey Paula, RDN, LDN  Clinical Nutrition

## 2022-09-05 NOTE — Progress Notes (Signed)
SATURATION QUALIFICATIONS: (This note is used to comply with regulatory documentation for home oxygen)  Patient Saturations on Room Air at Rest = 89%  Patient Saturations on Room Air while Ambulating = 82%  Patient Saturations on 3 Liters of oxygen while Ambulating = 92%  Please briefly explain why patient needs home oxygen: As noted above, pt quickly desaturates on RA. Pt also quickly desaturates on O2 3 L/M [place] while ambulating. Pt does recover within 1 minute at rest on O2 3 L/M [place].   SPO2 94-95% at rest with O2 3 L/M [place].

## 2022-09-05 NOTE — Progress Notes (Signed)
[facility] PROGRESS NOTE   [name] [id number] DOB: 09-Dec-1950 DOA: 09/04/2022  PCP: Cleta Alberts, MD  Brief History/Interval Summary: 72 y.o. female with medical history significant of COPD, tobacco abuse, diabetes, hypertension, depression, CAD, GERD, dyslipidemia, peripheral vascular disease, OSA, and anxiety, chronic anemia requiring blood transfusion and iron infusions.  She was found to have a hemoglobin of 6.5.  She was dyspneic.  She was sent to the emergency department.  She mentioned that she is noticed black stools for last several weeks.  Not very clear about her symptoms. First found to be anemic in 2019 at that time hemoglobin 6.6 she had a colonoscopy done by Dr. Benson Norway.   Consultants: Gastroenterology  Procedures: None    Subjective/Interval History: Patient denies any abdominal pain.  Denies any nausea or vomiting.  Mentions that she saw small amount of blood in her adult diaper 3 weeks ago.  None since then.    Assessment/Plan:  Symptomatic anemia Was sent for hemoglobin of 6.5.  Blood transfusions were ordered.  Hemoglobin has increased to 8.7 this morning.  Concern for GI bleed/history of small bowel AVMs Patient with longstanding anemia.  Apparently had a colonoscopy in 2019.  Patient was admitted in April for melena and acute blood loss anemia.  Push enteroscopy was unremarkable.  Capsule endoscopy was performed during that hospital stay.  Multiple small bowel angiectasia's were apparently seen. Looks like patient was referred to [facility] for AVM of the small bowel.  Has not had any procedures done in that center as yet.  She was seen at their office in October 2023.  Looks like at that time double-balloon enteroscopy was considered however this procedure would require prolonged general anesthesia which was thought to be risky for this patient with her significant other comorbidities.  So she was not considered a good candidate for this  procedure. Octreotide was discussed with the patient but she deferred at that time. Gastroenterology has been consulted.  Will wait for the input.  May need to revisit octreotide.  Coronary artery disease/chronic diastolic CHF Cardiac status appears to be stable.  Continue home medications.  Holding Entresto spironolactone and furosemide for now.  COPD/chronic respiratory failure with hypoxia Uses oxygen at home.  Respiratory status is stable.  Peripheral artery disease Stable.  Diabetes mellitus type 2 without complications Monitor CBGs.  Morbid obesity Estimated body mass index is 46.31 kg/m as calculated from the following:   Height as of this encounter: '4\' 11"'$  (1.499 m).   Weight as of this encounter: 104 kg.   DVT Prophylaxis: SCDs Code Status: Full code Family Communication: Discussed with patient Disposition Plan: Hopefully return home when improved  Status is: Observation The patient will require care spanning > 2 midnights and should be moved to inpatient because: GI bleed, symptomatic anemia      Medications: Scheduled:  sodium chloride   Intravenous Once   atorvastatin  80 mg Oral Daily   ezetimibe  10 mg Oral Daily   fluticasone furoate-vilanterol  1 puff Inhalation Daily   And   umeclidinium bromide  1 puff Inhalation Daily   gabapentin  600 mg Oral QHS   insulin aspart  0-9 Units Subcutaneous Q4H   ipratropium-albuterol  3 mL Nebulization QID   metoprolol succinate  25 mg Oral Daily   pantoprazole (PROTONIX) IV  40 mg Intravenous Q12H   sodium chloride flush  3 mL Intravenous Q12H   Continuous:  sodium chloride     [id number] chloride, acetaminophen **  OR** acetaminophen, albuterol, HYDROcodone-acetaminophen, sodium chloride flush  Antibiotics: Anti-infectives (From admission, onward)    None       Objective:  Vital Signs  Vitals:   09/05/22 0455 09/05/22 0522 09/05/22 0641 09/05/22 0900  BP: 107/62   121/72  Pulse: 63   (!) 58  Resp:  20 18    Temp: 98.4 F (36.9 C)   98 F (36.7 C)  TempSrc: Oral   Oral  SpO2: 95%   100%  Weight:      Height:   '4\' 11"'$  (1.499 m)     Intake/Output Summary (Last 24 hours) at 09/05/2022 1109 Last data filed at 09/05/2022 1040 Gross per 24 hour  Intake 1573 ml  Output --  Net 1573 ml   Filed Weights   09/04/22 1427  Weight: 104 kg    General appearance: Awake alert.  In no distress Resp: Clear to auscultation bilaterally.  Normal effort Cardio: S1-S2 is normal regular.  No S3-S4.  No rubs murmurs or bruit GI: Abdomen is soft.  Nontender nondistended.  Bowel sounds are present normal.  No masses organomegaly Extremities: No edema.  Full range of motion of lower extremities. Neurologic: Alert and oriented x3.  No focal neurological deficits.    Lab Results:  Data Reviewed: I have personally reviewed following labs and reports of the imaging studies  CBC: Recent Labs  Lab 09/04/22 1500 09/05/22 0826  WBC 5.3 4.6  HGB 7.0* 8.7*  HCT 25.8* 30.0*  MCV 80.9 82.0  PLT 574* 482*    Basic Metabolic Panel: Recent Labs  Lab 09/04/22 1500 09/05/22 0826  NA 138 136  K 3.7 3.8  CL 104 104  CO2 27 27  GLUCOSE 120* 107*  BUN 13 10  CREATININE 0.49 0.63  CALCIUM 8.9 8.5*  MG 1.7  --   PHOS 4.4  --     GFR: Estimated Creatinine Clearance: 67.7 mL/min (by C-G formula based on SCr of 0.63 mg/dL).  Liver Function Tests: Recent Labs  Lab 09/04/22 1500 09/05/22 0826  AST 14* 10*  ALT 8 7  ALKPHOS 68 69  BILITOT 0.4 0.7  PROT 7.3 6.8  ALBUMIN 3.7 3.3*     Coagulation Profile: Recent Labs  Lab 09/04/22 2055  INR 1.1    Cardiac Enzymes: Recent Labs  Lab 09/04/22 1500  CKTOTAL 32*      CBG: Recent Labs  Lab 09/04/22 2336 09/05/22 0406 09/05/22 0749  GLUCAP 97 102* 115*     Thyroid Function Tests: Recent Labs    09/04/22 2055  TSH 1.995    Anemia Panel: Recent Labs    09/04/22 1500 09/04/22 2055  VITAMINB12  --  265  FOLATE  --   12.3  FERRITIN  --  5*  TIBC  --  428  IRON  --  31  RETICCTPCT 2.4  --      Radiology Studies: DG Chest 2 View  Result Date: 09/04/2022 CLINICAL DATA:  Shortness of breath. EXAM: CHEST - 2 VIEW COMPARISON:  Chest radiographs 02/25/2022, 11/28/2021 FINDINGS: Cardiac silhouette is again moderately enlarged. Mediastinal contours are within normal limits with moderate calcification within the aortic arch. Mild bilateral mid and lower lung interstitial thickening and areas of linear scarring appear unchanged. No pleural effusion or pneumothorax. Moderate multilevel degenerative disc changes of the thoracic spine. IMPRESSION: 1. No acute lung process. 2. Mild bilateral mid and lower lung interstitial thickening and linear densities appear unchanged. This again may represent mild interstitial pulmonary  edema and/or scarring. Electronically Signed   By: Yvonne Kendall M.D.   On: 09/04/2022 14:50       LOS: 0 days   [facility] Pager on [email or web address]  09/05/2022, 11:09 AM

## 2022-09-06 ENCOUNTER — Other Ambulatory Visit (HOSPITAL_COMMUNITY): Payer: Self-pay

## 2022-09-06 ENCOUNTER — Encounter: Payer: Self-pay | Admitting: Internal Medicine

## 2022-09-06 DIAGNOSIS — K922 Gastrointestinal hemorrhage, unspecified: Secondary | ICD-10-CM | POA: Diagnosis not present

## 2022-09-06 DIAGNOSIS — E785 Hyperlipidemia, unspecified: Secondary | ICD-10-CM | POA: Diagnosis present

## 2022-09-06 DIAGNOSIS — Z7902 Long term (current) use of antithrombotics/antiplatelets: Secondary | ICD-10-CM | POA: Diagnosis not present

## 2022-09-06 DIAGNOSIS — Z96652 Presence of left artificial knee joint: Secondary | ICD-10-CM | POA: Diagnosis present

## 2022-09-06 DIAGNOSIS — Z79899 Other long term (current) drug therapy: Secondary | ICD-10-CM | POA: Diagnosis not present

## 2022-09-06 DIAGNOSIS — Z6841 Body Mass Index (BMI) 40.0 and over, adult: Secondary | ICD-10-CM | POA: Diagnosis not present

## 2022-09-06 DIAGNOSIS — D649 Anemia, unspecified: Secondary | ICD-10-CM | POA: Diagnosis not present

## 2022-09-06 DIAGNOSIS — Z7982 Long term (current) use of aspirin: Secondary | ICD-10-CM | POA: Diagnosis not present

## 2022-09-06 DIAGNOSIS — Z91199 Patient's noncompliance with other medical treatment and regimen due to unspecified reason: Secondary | ICD-10-CM | POA: Diagnosis not present

## 2022-09-06 DIAGNOSIS — E1151 Type 2 diabetes mellitus with diabetic peripheral angiopathy without gangrene: Secondary | ICD-10-CM | POA: Diagnosis present

## 2022-09-06 DIAGNOSIS — I110 Hypertensive heart disease with heart failure: Secondary | ICD-10-CM | POA: Diagnosis present

## 2022-09-06 DIAGNOSIS — Z7984 Long term (current) use of oral hypoglycemic drugs: Secondary | ICD-10-CM | POA: Diagnosis not present

## 2022-09-06 DIAGNOSIS — Z833 Family history of diabetes mellitus: Secondary | ICD-10-CM | POA: Diagnosis not present

## 2022-09-06 DIAGNOSIS — Z8249 Family history of ischemic heart disease and other diseases of the circulatory system: Secondary | ICD-10-CM | POA: Diagnosis not present

## 2022-09-06 DIAGNOSIS — D62 Acute posthemorrhagic anemia: Secondary | ICD-10-CM | POA: Diagnosis present

## 2022-09-06 DIAGNOSIS — J9611 Chronic respiratory failure with hypoxia: Secondary | ICD-10-CM | POA: Diagnosis present

## 2022-09-06 DIAGNOSIS — I2510 Atherosclerotic heart disease of native coronary artery without angina pectoris: Secondary | ICD-10-CM | POA: Diagnosis present

## 2022-09-06 DIAGNOSIS — K5521 Angiodysplasia of colon with hemorrhage: Secondary | ICD-10-CM | POA: Diagnosis present

## 2022-09-06 DIAGNOSIS — F419 Anxiety disorder, unspecified: Secondary | ICD-10-CM | POA: Diagnosis present

## 2022-09-06 DIAGNOSIS — F32A Depression, unspecified: Secondary | ICD-10-CM | POA: Diagnosis present

## 2022-09-06 DIAGNOSIS — I5032 Chronic diastolic (congestive) heart failure: Secondary | ICD-10-CM | POA: Diagnosis present

## 2022-09-06 DIAGNOSIS — K219 Gastro-esophageal reflux disease without esophagitis: Secondary | ICD-10-CM | POA: Diagnosis present

## 2022-09-06 DIAGNOSIS — G4733 Obstructive sleep apnea (adult) (pediatric): Secondary | ICD-10-CM | POA: Diagnosis present

## 2022-09-06 DIAGNOSIS — Z87891 Personal history of nicotine dependence: Secondary | ICD-10-CM | POA: Diagnosis not present

## 2022-09-06 DIAGNOSIS — J449 Chronic obstructive pulmonary disease, unspecified: Secondary | ICD-10-CM | POA: Diagnosis present

## 2022-09-06 LAB — CBC
HCT: 30.8 % — ABNORMAL LOW (ref 36.0–46.0)
Hemoglobin: 8.7 g/dL — ABNORMAL LOW (ref 12.0–15.0)
MCH: 23.3 pg — ABNORMAL LOW (ref 26.0–34.0)
MCHC: 28.2 g/dL — ABNORMAL LOW (ref 30.0–36.0)
MCV: 82.6 fL (ref 80.0–100.0)
Platelets: 456 10*3/uL — ABNORMAL HIGH (ref 150–400)
RBC: 3.73 MIL/uL — ABNORMAL LOW (ref 3.87–5.11)
RDW: 18.2 % — ABNORMAL HIGH (ref 11.5–15.5)
WBC: 4.6 10*3/uL (ref 4.0–10.5)
nRBC: 0 % (ref 0.0–0.2)

## 2022-09-06 LAB — TYPE AND SCREEN
ABO/RH(D): O POS
Antibody Screen: NEGATIVE
Donor AG Type: NEGATIVE
Donor AG Type: NEGATIVE
PT AG Type: NEGATIVE
Unit division: 0
Unit division: 0

## 2022-09-06 LAB — BASIC METABOLIC PANEL
Anion gap: 8 (ref 5–15)
BUN: 10 mg/dL (ref 8–23)
CO2: 26 mmol/L (ref 22–32)
Calcium: 8.8 mg/dL — ABNORMAL LOW (ref 8.9–10.3)
Chloride: 102 mmol/L (ref 98–111)
Creatinine, Ser: 0.75 mg/dL (ref 0.44–1.00)
GFR, Estimated: >60 mL/min (ref >60)
Glucose, Bld: 140 mg/dL — ABNORMAL HIGH (ref 70–99)
Potassium: 3.9 mmol/L (ref 3.5–5.1)
Sodium: 136 mmol/L (ref 135–145)

## 2022-09-06 LAB — BPAM RBC
Blood Product Expiration Date: 202404172359
Blood Product Expiration Date: 202404172359
ISSUE DATE / TIME: 202403132327
ISSUE DATE / TIME: 202403140208
Unit Type and Rh: 5100
Unit Type and Rh: 5100

## 2022-09-06 LAB — HEMOGLOBIN A1C
Hgb A1c MFr Bld: 5 % (ref 4.8–5.6)
Mean Plasma Glucose: 97 mg/dL

## 2022-09-06 LAB — GLUCOSE, CAPILLARY
Glucose-Capillary: 123 mg/dL — ABNORMAL HIGH (ref 70–99)
Glucose-Capillary: 126 mg/dL — ABNORMAL HIGH (ref 70–99)
Glucose-Capillary: 136 mg/dL — ABNORMAL HIGH (ref 70–99)
Glucose-Capillary: 137 mg/dL — ABNORMAL HIGH (ref 70–99)
Glucose-Capillary: 138 mg/dL — ABNORMAL HIGH (ref 70–99)
Glucose-Capillary: 158 mg/dL — ABNORMAL HIGH (ref 70–99)

## 2022-09-06 MED ORDER — OCTREOTIDE ACETATE 100 MCG/ML IJ SOLN
100.0000 ug | Freq: Three times a day (TID) | INTRAMUSCULAR | 2 refills | Status: AC
  Filled 2022-09-06: qty 84, 28d supply, fill #0
  Filled 2022-09-06: qty 6, 2d supply, fill #0
  Filled 2022-10-10 (×2): qty 90, 30d supply, fill #1

## 2022-09-06 MED ORDER — "SYRINGE/NEEDLE (DISP) 25G X 5/8"" 3 ML MISC"
1.0000 | Freq: Three times a day (TID) | 2 refills | Status: AC
  Filled 2022-09-06: qty 90, 30d supply, fill #0

## 2022-09-06 MED ORDER — IPRATROPIUM-ALBUTEROL 0.5-2.5 (3) MG/3ML IN SOLN
3.0000 mL | Freq: Three times a day (TID) | RESPIRATORY_TRACT | Status: DC
  Administered 2022-09-06 – 2022-09-07 (×3): 3 mL via RESPIRATORY_TRACT
  Filled 2022-09-06 (×3): qty 3

## 2022-09-06 NOTE — Plan of Care (Signed)
  Problem: Education: Goal: Knowledge of General Education information will improve Description Including pain rating scale, medication(s)/side effects and non-pharmacologic comfort measures Outcome: Progressing   Problem: Health Behavior/Discharge Planning: Goal: Ability to manage health-related needs will improve Outcome: Progressing   

## 2022-09-06 NOTE — Progress Notes (Signed)
Subjective: No complaints.  Feeling well.  Objective: Vital signs in last 24 hours: Temp:  [97.4 F (36.3 C)-98.3 F (36.8 C)] 97.4 F (36.3 C) (03/15 0407) Pulse Rate:  [53-62] 62 (03/15 1121) Resp:  [16-18] 18 (03/15 0407) BP: (121-155)/(72-98) 155/98 (03/15 1121) SpO2:  [97 %-100 %] 97 % (03/15 0835) Weight:  [103.3 kg] 103.3 kg (03/15 0407) Last BM Date : 09/04/22 (per pt)  Intake/Output from previous day: 03/14 0701 - 03/15 0700 In: 1459 [P.O.:1200; I.V.:9; IV Piggyback:250] Out: -  Intake/Output this shift: Total I/O In: 243 [P.O.:240; I.V.:3] Out: -   General appearance: alert and no distress GI: soft, non-tender; bowel sounds normal; no masses,  no organomegaly  Lab Results: Recent Labs    09/05/22 0826 09/05/22 1049 09/06/22 0504  WBC 4.6 4.0 4.6  HGB 8.7* 8.9* 8.7*  HCT 30.0* 31.3* 30.8*  PLT 482* 573* 456*   BMET Recent Labs    09/04/22 1500 09/05/22 0826 09/06/22 0504  NA 138 136 136  K 3.7 3.8 3.9  CL 104 104 102  CO2 27 27 26   GLUCOSE 120* 107* 140*  BUN 13 10 10   CREATININE 0.49 0.63 0.75  CALCIUM 8.9 8.5* 8.8*   LFT Recent Labs    09/05/22 0826  PROT 6.8  ALBUMIN 3.3*  AST 10*  ALT 7  ALKPHOS 69  BILITOT 0.7   PT/INR Recent Labs    09/04/22 2055  LABPROT 14.2  INR 1.1   Hepatitis Panel No results for input(s): "HEPBSAG", "HCVAB", "HEPAIGM", "HEPBIGM" in the last 72 hours. C-Diff No results for input(s): "CDIFFTOX" in the last 72 hours. Fecal Lactopherrin No results for input(s): "FECLLACTOFRN" in the last 72 hours.  Studies/Results: DG Chest 2 View  Result Date: 09/04/2022 CLINICAL DATA:  Shortness of breath. EXAM: CHEST - 2 VIEW COMPARISON:  Chest radiographs 02/25/2022, 11/28/2021 FINDINGS: Cardiac silhouette is again moderately enlarged. Mediastinal contours are within normal limits with moderate calcification within the aortic arch. Mild bilateral mid and lower lung interstitial thickening and areas of linear  scarring appear unchanged. No pleural effusion or pneumothorax. Moderate multilevel degenerative disc changes of the thoracic spine. IMPRESSION: 1. No acute lung process. 2. Mild bilateral mid and lower lung interstitial thickening and linear densities appear unchanged. This again may represent mild interstitial pulmonary edema and/or scarring. Electronically Signed   By: Yvonne Kendall M.D.   On: 09/04/2022 14:50    Medications: Scheduled:  sodium chloride   Intravenous Once   atorvastatin  80 mg Oral Daily   ezetimibe  10 mg Oral Daily   fluticasone furoate-vilanterol  1 puff Inhalation Daily   And   umeclidinium bromide  1 puff Inhalation Daily   gabapentin  600 mg Oral QHS   insulin aspart  0-9 Units Subcutaneous Q4H   ipratropium-albuterol  3 mL Nebulization TID   metoprolol succinate  25 mg Oral Daily   multivitamin with minerals  1 tablet Oral Daily   octreotide  100 mcg Subcutaneous TID   pantoprazole (PROTONIX) IV  40 mg Intravenous Q12H   sodium chloride flush  3 mL Intravenous Q12H   Continuous:  sodium chloride     ferric gluconate (FERRLECIT) IVPB 250 mg (09/06/22 1118)    Assessment/Plan: 1) Small bowel AVM bleed. 2) Anemia.   Her HGB is stable.  No issues with using octreotide yesterday.  Plan: 1) Maintain octreotide. 2) Follow HGB. 3) If stable tomorrow she can be discharged home. 4) D/C with Octreotide 100 mcg TID  subcutaneous, if possible. 5) Dr. Alessandra Bevels is covering this weekend.  LOS: 0 days   [name] D 09/06/2022, 12:36 PM

## 2022-09-06 NOTE — Progress Notes (Signed)
[facility] PROGRESS NOTE   [name] [id number] DOB: 1950-12-25 DOA: 09/04/2022  PCP: Cleta Alberts, MD  Brief History/Interval Summary: 72 y.o. female with medical history significant of COPD, tobacco abuse, diabetes, hypertension, depression, CAD, GERD, dyslipidemia, peripheral vascular disease, OSA, and anxiety, chronic anemia requiring blood transfusion and iron infusions.  She was found to have a hemoglobin of 6.5.  She was dyspneic.  She was sent to the emergency department.  She mentioned that she is noticed black stools for last several weeks.  Not very clear about her symptoms. First found to be anemic in 2019 at that time hemoglobin 6.6 she had a colonoscopy done by Dr. Benson Norway.   Consultants: Gastroenterology  Procedures: None    Subjective/Interval History: Patient denies any abdominal pain.  Continues to have black-colored stools.  No nausea or vomiting.  Tolerating her diet.    Assessment/Plan:  Symptomatic anemia Was sent for hemoglobin of 6.5.  She was transfused 2 units of PRBC.  Improvement in hemoglobin noted.  Stable this morning.  Also getting iron infusions.  Concern for GI bleed/history of small bowel AVMs Patient with longstanding anemia.  Apparently had a colonoscopy in 2019.  Patient was admitted in April for melena and acute blood loss anemia.  Push enteroscopy was unremarkable.  Capsule endoscopy was performed during that hospital stay.  Multiple small bowel angiectasia's were apparently seen. Looks like patient was referred to [facility] for AVM of the small bowel.  Has not had any procedures done in that center as yet.  She was seen at their office in October 2023.  Looks like at that time double-balloon enteroscopy was considered however this procedure would require prolonged general anesthesia which was thought to be risky for this patient with her significant other comorbidities.  So she was not considered a good candidate for this  procedure. Octreotide was discussed with the patient but she deferred at that time. Seen by gastroenterology.  Was given octreotide yesterday.  Coronary artery disease/chronic diastolic CHF Cardiac status appears to be stable.  Continue home medications.  Holding Entresto spironolactone and furosemide for now. Should be able to resume at discharge.  Renal function is noted to be normal.  COPD/chronic respiratory failure with hypoxia Uses oxygen at home.  Respiratory status is stable.  Peripheral artery disease Stable.  Diabetes mellitus type 2 without complications Monitor CBGs.  Morbid obesity Estimated body mass index is 46 kg/m as calculated from the following:   Height as of this encounter: 4\' 11"  (1.499 m).   Weight as of this encounter: 103.3 kg.   DVT Prophylaxis: SCDs Code Status: Full code Family Communication: Discussed with patient Disposition Plan: Anticipate discharge in 24 to 48 hours.  Status is: Observation The patient will require care spanning > 2 midnights and should be moved to inpatient because: GI bleed, symptomatic anemia      Medications: Scheduled:  sodium chloride   Intravenous Once   atorvastatin  80 mg Oral Daily   ezetimibe  10 mg Oral Daily   fluticasone furoate-vilanterol  1 puff Inhalation Daily   And   umeclidinium bromide  1 puff Inhalation Daily   gabapentin  600 mg Oral QHS   insulin aspart  0-9 Units Subcutaneous Q4H   ipratropium-albuterol  3 mL Nebulization TID   metoprolol succinate  25 mg Oral Daily   multivitamin with minerals  1 tablet Oral Daily   octreotide  100 mcg Subcutaneous TID   pantoprazole (PROTONIX) IV  40 mg  Intravenous Q12H   sodium chloride flush  3 mL Intravenous Q12H   Continuous:  sodium chloride     ferric gluconate (FERRLECIT) IVPB 250 mg (09/05/22 2350)   [id number] chloride, acetaminophen **OR** acetaminophen, albuterol, HYDROcodone-acetaminophen, sodium chloride  flush  Antibiotics: Anti-infectives (From admission, onward)    None       Objective:  Vital Signs  Vitals:   09/05/22 2028 09/06/22 0020 09/06/22 0407 09/06/22 0835  BP: (!) 154/81 130/72 121/77   Pulse: 60 (!) 58 (!) 53   Resp: 18 16 18    Temp: 98.2 F (36.8 C) 98.3 F (36.8 C) (!) 97.4 F (36.3 C)   TempSrc: Oral Oral Oral   SpO2: 100% 98% 100% 97%  Weight:   103.3 kg   Height:        Intake/Output Summary (Last 24 hours) at 09/06/2022 1033 Last data filed at 09/06/2022 0300 Gross per 24 hour  Intake 1459 ml  Output --  Net 1459 ml    Filed Weights   09/04/22 1427 09/06/22 0407  Weight: 104 kg 103.3 kg    General appearance: Awake alert.  In no distress Resp: Clear to auscultation bilaterally.  Normal effort Cardio: S1-S2 is normal regular.  No S3-S4.  No rubs murmurs or bruit GI: Abdomen is soft.  Nontender nondistended.  Bowel sounds are present normal.  No masses organomegaly Extremities: No edema.  Full range of motion of lower extremities.    Lab Results:  Data Reviewed: I have personally reviewed following labs and reports of the imaging studies  CBC: Recent Labs  Lab 09/04/22 1500 09/05/22 0826 09/05/22 1049 09/06/22 0504  WBC 5.3 4.6 4.0 4.6  HGB 7.0* 8.7* 8.9* 8.7*  HCT 25.8* 30.0* 31.3* 30.8*  MCV 80.9 82.0 82.8 82.6  PLT 574* 482* 573* 456*     Basic Metabolic Panel: Recent Labs  Lab 09/04/22 1500 09/05/22 0826 09/06/22 0504  NA 138 136 136  K 3.7 3.8 3.9  CL 104 104 102  CO2 27 27 26   GLUCOSE 120* 107* 140*  BUN 13 10 10   CREATININE 0.49 0.63 0.75  CALCIUM 8.9 8.5* 8.8*  MG 1.7  --   --   PHOS 4.4  --   --      GFR: Estimated Creatinine Clearance: 67.4 mL/min (by C-G formula based on SCr of 0.75 mg/dL).  Liver Function Tests: Recent Labs  Lab 09/04/22 1500 09/05/22 0826  AST 14* 10*  ALT 8 7  ALKPHOS 68 69  BILITOT 0.4 0.7  PROT 7.3 6.8  ALBUMIN 3.7 3.3*      Coagulation Profile: Recent Labs  Lab  09/04/22 2055  INR 1.1     Cardiac Enzymes: Recent Labs  Lab 09/04/22 1500  CKTOTAL 32*       CBG: Recent Labs  Lab 09/05/22 1705 09/05/22 2042 09/06/22 0017 09/06/22 0405 09/06/22 0735  GLUCAP 86 199* 138* 137* 123*      Thyroid Function Tests: Recent Labs    09/04/22 2055  TSH 1.995     Anemia Panel: Recent Labs    09/04/22 1500 09/04/22 2055  VITAMINB12  --  265  FOLATE  --  12.3  FERRITIN  --  5*  TIBC  --  428  IRON  --  31  RETICCTPCT 2.4  --       Radiology Studies: DG Chest 2 View  Result Date: 09/04/2022 CLINICAL DATA:  Shortness of breath. EXAM: CHEST - 2 VIEW COMPARISON:  Chest radiographs  02/25/2022, 11/28/2021 FINDINGS: Cardiac silhouette is again moderately enlarged. Mediastinal contours are within normal limits with moderate calcification within the aortic arch. Mild bilateral mid and lower lung interstitial thickening and areas of linear scarring appear unchanged. No pleural effusion or pneumothorax. Moderate multilevel degenerative disc changes of the thoracic spine. IMPRESSION: 1. No acute lung process. 2. Mild bilateral mid and lower lung interstitial thickening and linear densities appear unchanged. This again may represent mild interstitial pulmonary edema and/or scarring. Electronically Signed   By: Yvonne Kendall M.D.   On: 09/04/2022 14:50       LOS: 0 days   [facility] Pager on [email or web address]  09/06/2022, 10:33 AM

## 2022-09-07 DIAGNOSIS — D649 Anemia, unspecified: Secondary | ICD-10-CM | POA: Diagnosis not present

## 2022-09-07 LAB — CBC
HCT: 35.3 % — ABNORMAL LOW (ref 36.0–46.0)
Hemoglobin: 9.6 g/dL — ABNORMAL LOW (ref 12.0–15.0)
MCH: 23.3 pg — ABNORMAL LOW (ref 26.0–34.0)
MCHC: 27.2 g/dL — ABNORMAL LOW (ref 30.0–36.0)
MCV: 85.7 fL (ref 80.0–100.0)
Platelets: 523 10*3/uL — ABNORMAL HIGH (ref 150–400)
RBC: 4.12 MIL/uL (ref 3.87–5.11)
RDW: 18.6 % — ABNORMAL HIGH (ref 11.5–15.5)
WBC: 7.7 10*3/uL (ref 4.0–10.5)
nRBC: 0.4 % — ABNORMAL HIGH (ref 0.0–0.2)

## 2022-09-07 LAB — GLUCOSE, CAPILLARY
Glucose-Capillary: 121 mg/dL — ABNORMAL HIGH (ref 70–99)
Glucose-Capillary: 133 mg/dL — ABNORMAL HIGH (ref 70–99)
Glucose-Capillary: 139 mg/dL — ABNORMAL HIGH (ref 70–99)

## 2022-09-07 MED ORDER — CILOSTAZOL 100 MG PO TABS: 100.0000 mg | ORAL_TABLET | Freq: Two times a day (BID) | ORAL | Status: DC

## 2022-09-07 MED ORDER — ASPIRIN 81 MG PO TBEC: 81.0000 mg | DELAYED_RELEASE_TABLET | Freq: Every day | ORAL | 0 refills | Status: DC

## 2022-09-07 NOTE — Discharge Summary (Signed)
[facility]  Physician Discharge Summary   Patient ID: [name] MRN: [id number] DOB/AGE: April 27, 1951 72 y.o.  Admit date: 09/04/2022 Discharge date: 09/07/2022    PCP: Cleta Alberts, MD  DISCHARGE DIAGNOSES:    Symptomatic anemia   Acute on chronic blood loss anemia Suspected acute on chronic GI bleed secondary to small bowel AVMs   Chronic diastolic CHF (congestive heart failure) (HCC)   Coronary artery disease   PAD (peripheral artery disease) (HCC)   COPD (chronic obstructive pulmonary disease) (HCC)   Type 2 diabetes mellitus without complication, without long-term current use of insulin (HCC)   Obesity, Class III, BMI 40-49.9 (morbid obesity) ([place])   RECOMMENDATIONS FOR OUTPATIENT FOLLOW UP: Patient instructed to follow-up with primary care provider next week to check hemoglobin Dr. Benson Norway to arrange outpatient follow-up    Home Health: None Equipment/Devices: None  CODE STATUS: Full code  DISCHARGE CONDITION: fair  Diet recommendation: As before  INITIAL HISTORY: 72 y.o. female with medical history significant of COPD, tobacco abuse, diabetes, hypertension, depression, CAD, GERD, dyslipidemia, peripheral vascular disease, OSA, and anxiety, chronic anemia requiring blood transfusion and iron infusions.  She was found to have a hemoglobin of 6.5.  She was dyspneic.  She was sent to the emergency department.  She mentioned that she is noticed black stools for last several weeks.  Not very clear about her symptoms. First found to be anemic in 2019 at that time hemoglobin 6.6 she had a colonoscopy done by Dr. Benson Norway.   Consultants: Gastroenterology   Procedures: None   HOSPITAL COURSE:   Symptomatic anemia Admitted for hemoglobin of 6.5.  She was transfused 2 units of PRBC with improvement in hemoglobin.  She also received iron infusions.     Concern for GI bleed/history of small bowel AVMs Patient with longstanding anemia.  Apparently had a  colonoscopy in 2019.  Patient was admitted in April for melena and acute blood loss anemia.  Push enteroscopy was unremarkable.  Capsule endoscopy was performed during that hospital stay.  Multiple small bowel angiectasia's were apparently seen. Looks like patient was referred to [facility] for AVM of the small bowel.  Has not had any procedures done in that center as yet.  She was seen at their office in October 2023.  Looks like at that time double-balloon enteroscopy was considered however this procedure would require prolonged general anesthesia which was thought to be risky for this patient with her significant other comorbidities.  So she was not considered a good candidate for this procedure. Octreotide was discussed with the patient but she deferred at that time. Seen by gastroenterology here in the hospital.  Started on octreotide.   Coronary artery disease/chronic diastolic CHF Cardiac status appears to be stable.  May resume home medications.   COPD/chronic respiratory failure with hypoxia Uses oxygen at home.  Respiratory status is stable.   Peripheral artery disease Stable.  Noted to be on aspirin and Pletal prior to admission.  Can be resumed if hemoglobin is stable at outpatient follow-up and if there is no recurrence of melanotic stools.   Diabetes mellitus type 2 without complications Monitor CBGs.   Morbid obesity Estimated body mass index is 46 kg/m as calculated from the following:   Height as of this encounter: 4\' 11"  (1.499 m).   Weight as of this encounter: 103.3 kg.   Patient stable.  Okay for discharge home today.  PERTINENT LABS:  The results of significant diagnostics from this hospitalization (including imaging, microbiology,  ancillary and laboratory) are listed below for reference.     Labs:   Basic Metabolic Panel: Recent Labs  Lab 09/04/22 1500 09/05/22 0826 09/06/22 0504  NA 138 136 136  K 3.7 3.8 3.9  CL 104 104 102  CO2 27 27 26   GLUCOSE 120*  107* 140*  BUN 13 10 10   CREATININE 0.49 0.63 0.75  CALCIUM 8.9 8.5* 8.8*  MG 1.7  --   --   PHOS 4.4  --   --    Liver Function Tests: Recent Labs  Lab 09/04/22 1500 09/05/22 0826  AST 14* 10*  ALT 8 7  ALKPHOS 68 69  BILITOT 0.4 0.7  PROT 7.3 6.8  ALBUMIN 3.7 3.3*    CBC: Recent Labs  Lab 09/04/22 1500 09/05/22 0826 09/05/22 1049 09/06/22 0504 09/07/22 0403  WBC 5.3 4.6 4.0 4.6 7.7  HGB 7.0* 8.7* 8.9* 8.7* 9.6*  HCT 25.8* 30.0* 31.3* 30.8* 35.3*  MCV 80.9 82.0 82.8 82.6 85.7  PLT 574* 482* 573* 456* 523*   Cardiac Enzymes: Recent Labs  Lab 09/04/22 1500  CKTOTAL 32*     CBG: Recent Labs  Lab 09/06/22 1721 09/06/22 1936 09/07/22 0030 09/07/22 0444 09/07/22 0829  GLUCAP 158* 136* 139* 121* 133*     IMAGING STUDIES DG Chest 2 View  Result Date: 09/04/2022 CLINICAL DATA:  Shortness of breath. EXAM: CHEST - 2 VIEW COMPARISON:  Chest radiographs 02/25/2022, 11/28/2021 FINDINGS: Cardiac silhouette is again moderately enlarged. Mediastinal contours are within normal limits with moderate calcification within the aortic arch. Mild bilateral mid and lower lung interstitial thickening and areas of linear scarring appear unchanged. No pleural effusion or pneumothorax. Moderate multilevel degenerative disc changes of the thoracic spine. IMPRESSION: 1. No acute lung process. 2. Mild bilateral mid and lower lung interstitial thickening and linear densities appear unchanged. This again may represent mild interstitial pulmonary edema and/or scarring. Electronically Signed   By: Yvonne Kendall M.D.   On: 09/04/2022 14:50    DISCHARGE EXAMINATION: Vitals:   09/07/22 0447 09/07/22 0803 09/07/22 0806 09/07/22 1002  BP: (!) 141/73   (!) 144/81  Pulse: (!) 56   (!) 53  Resp: 17     Temp: 98.4 F (36.9 C)     TempSrc: Oral     SpO2: 100% 99% 99%   Weight:      Height:       General appearance: Awake alert.  In no distress Resp: Clear to auscultation bilaterally.   Normal effort Cardio: S1-S2 is normal regular.  No S3-S4.  No rubs murmurs or bruit GI: Abdomen is soft.  Nontender nondistended.  Bowel sounds are present normal.  No masses organomegaly   DISPOSITION: Home  Discharge Instructions     (HEART FAILURE PATIENTS) Call MD:  Anytime you have any of the following symptoms: 1) 3 pound weight gain in 24 hours or 5 pounds in 1 week 2) shortness of breath, with or without a dry hacking cough 3) swelling in the hands, feet or stomach 4) if you have to sleep on extra pillows at night in order to breathe.   Complete by: As directed    Call MD for:  difficulty breathing, headache or visual disturbances   Complete by: As directed    Call MD for:  extreme fatigue   Complete by: As directed    Call MD for:  persistant dizziness or light-headedness   Complete by: As directed    Call MD for:  persistant nausea  and vomiting   Complete by: As directed    Call MD for:  severe uncontrolled pain   Complete by: As directed    Call MD for:  temperature >100.4   Complete by: As directed    Diet - low sodium heart healthy   Complete by: As directed    Discharge instructions   Complete by: As directed    Please take your medications as prescribed.  Please be sure to follow-up with your primary care provider next week to have blood work done to check your hemoglobin.  You were cared for by a hospitalist during your hospital stay. If you have any questions about your discharge medications or the care you received while you were in the hospital after you are discharged, you can call the unit and asked to speak with the hospitalist on call if the hospitalist that took care of you is not available. Once you are discharged, your primary care physician will handle any further medical issues. Please note that NO REFILLS for any discharge medications will be authorized once you are discharged, as it is imperative that you return to your primary care physician (or establish a  relationship with a primary care physician if you do not have one) for your aftercare needs so that they can reassess your need for medications and monitor your lab values. If you do not have a primary care physician, you can call [phone] for a physician referral.   Increase activity slowly   Complete by: As directed          Allergies as of 09/07/2022   No Known Allergies      Medication List     STOP taking these medications    metFORMIN 500 MG tablet Commonly known as: GLUCOPHAGE   metoprolol tartrate 50 MG tablet Commonly known as: LOPRESSOR   Myrbetriq 25 MG Tb24 tablet Generic drug: mirabegron ER   spironolactone 50 MG tablet Commonly known as: ALDACTONE       TAKE these medications    acetaminophen 500 MG tablet Commonly known as: TYLENOL Take 1,000 mg by mouth as needed for moderate pain.   albuterol (2.5 MG/3ML) 0.083% nebulizer solution Commonly known as: PROVENTIL Take 2.5 mg by nebulization in the morning, at noon, and at bedtime.   albuterol 108 (90 Base) MCG/ACT inhaler Commonly known as: VENTOLIN HFA Inhale 1-2 puffs into the lungs every 6 (six) hours as needed for wheezing or shortness of breath.   aspirin EC 81 MG tablet Commonly known as: Aspirin Low Dose Take 1 tablet (81 mg total) by mouth daily. Resume after 1 week, on 09/13/2022 as long as you are not having black stools. Start taking on: September 13, 2022 What changed:  See the new instructions. These instructions start on September 13, 2022. If you are unsure what to do until then, ask your doctor or other care provider.   atorvastatin 80 MG tablet Commonly known as: LIPITOR Take 80 mg by mouth daily.   B-D 3CC LUER-LOK SYR 25GX5/8" 25G X 5/8" 3 ML Misc Generic drug: SYRINGE-NEEDLE (DISP) 3 ML Inject 1 Syringe into the skin 3 (three) times daily. For use with Octreodide injection.   cefdinir 300 MG capsule Commonly known as: OMNICEF Take 300 mg by mouth 2 (two) times daily.    cilostazol 100 MG tablet Commonly known as: PLETAL Take 1 tablet (100 mg total) by mouth 2 (two) times daily. Resume after 1 week, on 09/13/2022 as long as you are not  having black stools. Start taking on: September 13, 2022 What changed:  additional instructions These instructions start on September 13, 2022. If you are unsure what to do until then, ask your doctor or other care provider.   Entresto 24-26 MG Generic drug: sacubitril-valsartan Take 1 tablet by mouth 2 (two) times daily.   ezetimibe 10 MG tablet Commonly known as: ZETIA TAKE 1 TABLET BY MOUTH DAILY   ferrous sulfate 325 (65 FE) MG tablet Take 325 mg by mouth daily.   fluticasone 50 MCG/ACT nasal spray Commonly known as: FLONASE Place 1 spray into both nostrils daily as needed for allergies.   furosemide 40 MG tablet Commonly known as: Lasix Take 1 tablet (40 mg total) by mouth 2 (two) times daily.   gabapentin 600 MG tablet Commonly known as: NEURONTIN Take 1 tablet (600 mg total) by mouth at bedtime. What changed: when to take this   ipratropium-albuterol 0.5-2.5 (3) MG/3ML Soln Commonly known as: DUONEB Take 3 mLs by nebulization 3 (three) times daily. What changed:  when to take this reasons to take this   Linzess 145 MCG Caps capsule Generic drug: linaclotide Take 145 mcg by mouth daily as needed (Constipation).   methocarbamol 500 MG tablet Commonly known as: ROBAXIN Take 500 mg by mouth daily.   metoprolol succinate 25 MG 24 hr tablet Commonly known as: Toprol XL Take 1 tablet (25 mg total) by mouth daily.   nitroGLYCERIN 0.4 MG SL tablet Commonly known as: NITROSTAT Place 1 tablet (0.4 mg total) under the tongue every 5 (five) minutes as needed for chest pain.   octreotide 100 MCG/ML Soln injection Commonly known as: SANDOSTATIN Inject 1 mL (100 mcg total) into the skin 3 (three) times daily.   OXYGEN Inhale 3 L into the lungs continuous.   pantoprazole 40 MG tablet Commonly known as:  PROTONIX Take 1 tablet (40 mg total) by mouth 2 (two) times daily. What changed: when to take this   Potassium Chloride ER 20 MEQ Tbcr Take 20 mEq by mouth daily.   Trelegy Ellipta 100-62.5-25 MCG/ACT Aepb Generic drug: Fluticasone-Umeclidin-Vilant Inhale 1 puff into the lungs daily. What changed: how much to take   VITAMIN D-3 PO Take 1 capsule by mouth 2 (two) times daily.           TOTAL DISCHARGE TIME: 35 minutes  Javeah Loeza [employer] on [email or web address]  09/08/2022, 10:33 AM

## 2022-09-07 NOTE — TOC Initial Note (Signed)
Transition of Care [facility]) - Initial/Assessment Note    Patient Details  Name: [name] MRN: [id number] Date of Birth: June 25, 1950  Transition of Care [facility]) CM/SW Contact:    Henrietta Dine, RN Phone Number: 09/07/2022, 10:57 AM  Clinical Narrative:                 TOC spoke w/ pt in room; pt says she lives with her dtr and she plans to return at d/c; she has transportation; she identified POC [name] (dtr) [phone]; pt denies IPV, food insecurity, or difficulty paying utilities; she has glasses and dentures (upper/lower); she does not have HA; pt says she has a rolator, shower chair, and bars in her shower; she does not have [place] services; pt says she has oxygen w/ [employer], and she will con't this service at d/c; she also says her travel tank is full; no TOC needs.  Expected Discharge Plan: Home/Self Care Barriers to Discharge: No Barriers Identified   Patient Goals and CMS Choice Patient states their goals for this hospitalization and ongoing recovery are:: home          Expected Discharge Plan and Services   Discharge Planning Services: CM Consult   Living arrangements for the past 2 months: Single Family Home Expected Discharge Date: 09/07/22                                    Prior Living Arrangements/Services Living arrangements for the past 2 months: Single Family Home Lives with:: Adult Children Patient language and need for interpreter reviewed:: Yes Do you feel safe going back to the place where you live?: Yes      Need for Family Participation in Patient Care: Yes (Comment) Care giver support system in place?: Yes (comment) Current home services: DME (home oxygen w/ [employer]) Criminal Activity/Legal Involvement Pertinent to Current Situation/Hospitalization: No - Comment as needed  Activities of Daily Living Home Assistive Devices/Equipment: CBG Meter, Oxygen, Walker (specify type), Eyeglasses, Blood pressure cuff, Scales (pulse  oximeter) ADL Screening (condition at time of admission) Patient's cognitive ability adequate to safely complete daily activities?: Yes Is the patient deaf or have difficulty hearing?: No Does the patient have difficulty seeing, even when wearing glasses/contacts?: No Does the patient have difficulty concentrating, remembering, or making decisions?: No Patient able to express need for assistance with ADLs?: Yes Does the patient have difficulty dressing or bathing?: No Independently performs ADLs?: Yes (appropriate for developmental age) Does the patient have difficulty walking or climbing stairs?: Yes Weakness of Legs: Both Weakness of Arms/Hands: None  Permission Sought/Granted Permission sought to share information with : Case Manager Permission granted to share information with : Yes, Verbal Permission Granted  Share Information with NAME: Lenor Coffin, RN, CM     Permission granted to share info w Relationship: [name] (dtr) [phone]     Emotional Assessment Appearance:: Appears stated age Attitude/Demeanor/Rapport: Gracious Affect (typically observed): Accepting Orientation: : Oriented to Self, Oriented to Place, Oriented to  Time, Oriented to Situation Alcohol / Substance Use: Not Applicable Psych Involvement: No (comment)  Admission diagnosis:  Symptomatic anemia [D64.9] Gastrointestinal hemorrhage, unspecified gastrointestinal hemorrhage type [K92.2] Anemia, unspecified type [D64.9] Patient Active Problem List   Diagnosis Date Noted   Symptomatic anemia 09/04/2022   Dyslipidemia 02/26/2022   Type 2 diabetes mellitus with peripheral neuropathy ([place]) 02/26/2022   COPD without exacerbation ([place]) 02/26/2022   Acute on  chronic respiratory failure with hypoxia ([place]) 02/26/2022   Acute hypoxemic respiratory failure (HCC) 02/26/2022   Chronic diastolic CHF (congestive heart failure) ([place]) 02/25/2022   Streptococcal pneumonia ([place])    Streptococcal bacteremia     CAP (community acquired pneumonia) 11/29/2021   Sepsis ([place]) 11/29/2021   Acute bacterial bronchitis 11/08/2021   GI bleed 11/08/2021   Elevated troponin level not due myocardial infarction 11/07/2021   Acute cardiogenic pulmonary edema ([place]) 11/07/2021   Chronic respiratory failure with hypoxia ([place]) 11/07/2021   Mixed diabetic hyperlipidemia associated with type 2 diabetes mellitus ([place]) 11/07/2021   Melena    Anemia due to chronic blood loss    AVM (arteriovenous malformation) of small bowel, acquired with hemorrhage    Acute on chronic blood loss anemia 09/21/2021   Thrombocytosis 09/21/2021   Type 2 diabetes mellitus without complication, without long-term current use of insulin ([place]) 09/21/2021   Obesity, Class III, BMI 40-49.9 (morbid obesity) ([place]) 08/27/2021   Acute upper gastrointestinal bleeding 05/09/2021   COPD (chronic obstructive pulmonary disease) ([place])    Coronary artery disease    Osteoarthritis of left knee 04/18/2020   Tobacco use disorder 09/18/2018   Iron deficiency anemia 04/09/2018   Chronic anemia 12/26/2015   PAD (peripheral artery disease) ([place]) 10/05/2013   Discomfort in chest 05/11/2012   Nicotine dependence 05/11/2012   Bradycardia 05/11/2012   Essential hypertension 05/11/2012   Back pain 05/11/2012   PCP:  Cleta Alberts, MD Pharmacy:   [facility] [place], [place] AT [facility] [address] [place] [address] Phone: [phone] Fax: [place] [address] Phone: [phone] Fax: [phone]     Social Determinants of Health (SDOH) Social History: SDOH Screenings   Food Insecurity: No Food Insecurity (09/04/2022)  Housing: Low Risk  (09/04/2022)  Transportation Needs: No Transportation Needs (09/04/2022)  Utilities: Not At Risk (09/04/2022)  Depression (PHQ2-9): Low Risk  (01/10/2022)  Physical  Activity: Insufficiently Active (03/15/2022)  Stress: Stress Concern Present (03/15/2022)  Tobacco Use: Medium Risk (09/04/2022)   SDOH Interventions:     Readmission Risk Interventions    08/28/2021   10:31 AM  Readmission Risk Prevention Plan  Transportation Screening Complete  PCP or Specialist Appt within 3-5 Days Complete  HRI or [place] Complete  Social Work Consult for [place] Planning/Counseling Complete  Palliative Care Screening Complete  Medication Review Press photographer) Complete

## 2022-09-09 ENCOUNTER — Other Ambulatory Visit (HOSPITAL_COMMUNITY): Payer: Self-pay

## 2022-09-12 ENCOUNTER — Telehealth: Payer: Self-pay

## 2022-09-12 ENCOUNTER — Ambulatory Visit: Payer: Medicare HMO | Admitting: Cardiology

## 2022-09-12 ENCOUNTER — Encounter: Payer: Self-pay | Admitting: Cardiology

## 2022-09-12 VITALS — BP 130/82 | HR 73 | Resp 18 | Ht 59.0 in | Wt 222.0 lb

## 2022-09-12 DIAGNOSIS — I2510 Atherosclerotic heart disease of native coronary artery without angina pectoris: Secondary | ICD-10-CM

## 2022-09-12 DIAGNOSIS — E782 Mixed hyperlipidemia: Secondary | ICD-10-CM

## 2022-09-12 DIAGNOSIS — I6523 Occlusion and stenosis of bilateral carotid arteries: Secondary | ICD-10-CM

## 2022-09-12 DIAGNOSIS — E119 Type 2 diabetes mellitus without complications: Secondary | ICD-10-CM

## 2022-09-12 DIAGNOSIS — E1151 Type 2 diabetes mellitus with diabetic peripheral angiopathy without gangrene: Secondary | ICD-10-CM

## 2022-09-12 DIAGNOSIS — I5032 Chronic diastolic (congestive) heart failure: Secondary | ICD-10-CM

## 2022-09-12 DIAGNOSIS — I10 Essential (primary) hypertension: Secondary | ICD-10-CM

## 2022-09-12 DIAGNOSIS — I739 Peripheral vascular disease, unspecified: Secondary | ICD-10-CM

## 2022-09-12 DIAGNOSIS — I5033 Acute on chronic diastolic (congestive) heart failure: Secondary | ICD-10-CM

## 2022-09-12 DIAGNOSIS — Z87891 Personal history of nicotine dependence: Secondary | ICD-10-CM

## 2022-09-12 NOTE — Telephone Encounter (Signed)
Patient home blood pressure is well controlled on current antihypertensive regimen. Patient weight has been fairly stable since the last office visit.   Systolic Blood Pressure mmHg -- 112.1 ([id number] - [id number]) Diastolic Blood Pressure mmHg -- 65.2 (55.0 - 83.0) Heart Rate bpm -- 69.7 (58.0 - 96.0)   09/10/22 8:14 AM 140 / 83 mmHg 61 bpm  09/04/22 9:09 AM 122 / 71 mmHg 71 bpm  09/02/22 9:13 AM 121 / 67 mmHg 85 bpm  08/30/22 10:10 AM 121 / 69 mmHg 70 bpm  08/29/22 9:17 AM 134 / 78 mmHg 68 bpm  08/28/22 7:39 AM 120 / 66 mmHg 96 bpm  08/27/22 9:15 AM 118 / 69 mmHg 73 bpm  08/26/22 8:49 AM 117 / 68 mmHg 75 bpm  08/23/22 10:09 AM 118 / 66 mmHg 79 bpm   Weight lb -- 230.0 (221.4 - 236.6)  09/10/22 7:49 AM 226.2 lb    09/02/22 10:09 AM 230.0 lb    08/31/22 1:51 PM 230.4 lb    08/29/22 9:28 AM 229.6 lb    08/24/22 11:43 AM 221.4 lb       08/19/22 10:48 AM 233.0 lb    08/17/22 12:07 PM 232.6 lb    08/13/22 8:56 AM 236.6 lb

## 2022-09-12 NOTE — Progress Notes (Signed)
[name] Date of Birth: 07-25-1950 MRN: [id number] Primary Care Provider:Torres, Mickel Baas, MD Former Cardiology Providers: Jeri Lager, APRN, FNP-C Primary Cardiologist: Rex Kras, DO, [facility] (established care 08/30/2019)  Date: 09/12/22 Last Office Visit: 03/14/2022  Chief Complaint  Patient presents with   Follow-up    6 month Heart failure follow-up    HPI  [name] is a 72 y.o.  female whose past medical history and cardiovascular risk factors include: Established coronary artery disease without angina pectoris, hypertension, hyperlipidemia, former smoker, peripheral artery disease status post PV angiogram with revascularization of the SFA, non-insulin-dependent diabetes, postmenopausal female, advanced age, obesity due to excess calories, history of prior GI bleed (most recent 08/2022 2 PRBCs and IV iron), COPD.   Patient being followed by the practice for CAD, HFpEF, carotid disease, PAD.  Since last office visit patient states that she is doing well from a cardiovascular standpoint.  She denies anginal discomfort, heart failure symptoms, or claudication.  Unfortunately she was hospitalized in March 2024 due to symptomatic anemia with a hemoglobin of 6.6 when checked by PCP.  She received 2 units of packed red blood cells and IV iron.  And has been started on octreotide.  She is no longer experiencing melena.  Her ambulatory blood pressure readings are well-controlled.  And her weight is approximately 230 pounds.  Results of the nuclear stress test and carotid duplex since last visit independently reviewed and noted below for further reference.  ALLERGIES: No Known Allergies  MEDICATION LIST PRIOR TO VISIT: Current Outpatient Medications on File Prior to Visit  Medication Sig Dispense Refill   acetaminophen (TYLENOL) 500 MG tablet Take 1,000 mg by mouth as needed for moderate pain.     albuterol (PROVENTIL) (2.5 MG/3ML) 0.083% nebulizer solution  Take 2.5 mg by nebulization in the morning, at noon, and at bedtime.     albuterol (VENTOLIN HFA) 108 (90 Base) MCG/ACT inhaler Inhale 1-2 puffs into the lungs every 6 (six) hours as needed for wheezing or shortness of breath. 8.5 g 1   [START ON 09/13/2022] aspirin EC (ASPIRIN LOW DOSE) 81 MG tablet Take 1 tablet (81 mg total) by mouth daily. Resume after 1 week, on 09/13/2022 as long as you are not having black stools. 30 tablet 0   atorvastatin (LIPITOR) 80 MG tablet Take 80 mg by mouth daily.     cefdinir (OMNICEF) 300 MG capsule Take 300 mg by mouth 2 (two) times daily.     Cholecalciferol (VITAMIN D-3 PO) Take 1 capsule by mouth 2 (two) times daily.     [START ON 09/13/2022] cilostazol (PLETAL) 100 MG tablet Take 1 tablet (100 mg total) by mouth 2 (two) times daily. Resume after 1 week, on 09/13/2022 as long as you are not having black stools.     ezetimibe (ZETIA) 10 MG tablet TAKE 1 TABLET BY MOUTH DAILY (Patient taking differently: Take 10 mg by mouth daily.) 90 tablet 3   ferrous sulfate 325 (65 FE) MG tablet Take 325 mg by mouth daily.     fluticasone (FLONASE) 50 MCG/ACT nasal spray Place 1 spray into both nostrils daily as needed for allergies.     Fluticasone-Umeclidin-Vilant (TRELEGY ELLIPTA) 100-62.5-25 MCG/ACT AEPB Inhale 1 puff into the lungs daily. (Patient taking differently: Inhale 2 puffs into the lungs daily.) 1 each 5   furosemide (LASIX) 40 MG tablet Take 1 tablet (40 mg total) by mouth 2 (two) times daily. 60 tablet 0   gabapentin (NEURONTIN) 600 MG tablet  Take 1 tablet (600 mg total) by mouth at bedtime. (Patient taking differently: Take 600 mg by mouth 2 (two) times daily.) 30 tablet 11   ipratropium-albuterol (DUONEB) 0.5-2.5 (3) MG/3ML SOLN Take 3 mLs by nebulization 3 (three) times daily. (Patient taking differently: Take 3 mLs by nebulization as needed (wheezing).) 270 mL 1   LINZESS 145 MCG CAPS capsule Take 145 mcg by mouth daily as needed (Constipation).      methocarbamol (ROBAXIN) 500 MG tablet Take 500 mg by mouth daily.     metoprolol succinate (TOPROL XL) 25 MG 24 hr tablet Take 1 tablet (25 mg total) by mouth daily. 30 tablet 2   nitroGLYCERIN (NITROSTAT) 0.4 MG SL tablet Place 1 tablet (0.4 mg total) under the tongue every 5 (five) minutes as needed for chest pain. 90 tablet 3   octreotide (SANDOSTATIN) 100 MCG/ML SOLN injection Inject 1 mL (100 mcg total) into the skin 3 (three) times daily. 90 mL 2   OXYGEN Inhale 3 L into the lungs continuous.     pantoprazole (PROTONIX) 40 MG tablet Take 1 tablet (40 mg total) by mouth 2 (two) times daily. (Patient taking differently: Take 40 mg by mouth daily.) 60 tablet 0   Potassium Chloride ER 20 MEQ TBCR Take 20 mEq by mouth daily.     sacubitril-valsartan (ENTRESTO) 24-26 MG Take 1 tablet by mouth 2 (two) times daily. 60 tablet 2   SYRINGE-NEEDLE, DISP, 3 ML 25G X 5/8" 3 ML MISC Inject 1 Syringe into the skin 3 (three) times daily. For use with Octreodide injection. 100 each 2   No current facility-administered medications on file prior to visit.    PAST MEDICAL HISTORY: Past Medical History:  Diagnosis Date   Anemia 12/26/2015   Anxiety    Arthritis    Blood transfusion without reported diagnosis    Chronic pain    resolved per pt 04/12/20   COPD (chronic obstructive pulmonary disease) (HCC)    Coronary artery disease    Depression    Diabetes mellitus without complication (HCC)    type 2   GERD (gastroesophageal reflux disease)    Heart murmur    since birth; Echo 02/18/19: LVEF 0000000, grade 1 diastolic dysfunction, mild AS (mean grad 12 mmHg), trace MR/PR, mild TR, PASP 32 mmHg     Hyperlipemia    Hypertension    PVD (peripheral vascular disease) ([place])    right SFA stent 02/11/17 by Dr. Einar Gip   Sleep apnea    does not use cpap   Wears glasses     PAST SURGICAL HISTORY: Past Surgical History:  Procedure Laterality Date   ABDOMINAL HYSTERECTOMY     CARDIAC CATHETERIZATION      CATARACT EXTRACTION     CESAREAN SECTION     COLONOSCOPY N/A 01/08/2013   Procedure: COLONOSCOPY;  Surgeon: Beryle Beams, MD;  Location: [facility];  Service: Endoscopy;  Laterality: N/A;   COLONOSCOPY N/A 12/28/2015   Procedure: COLONOSCOPY;  Surgeon: Carol Ada, MD;  Location: [place];  Service: Endoscopy;  Laterality: N/A;   COLONOSCOPY WITH PROPOFOL N/A 10/05/2020   Procedure: COLONOSCOPY WITH PROPOFOL;  Surgeon: Carol Ada, MD;  [facility]: [facility];  Service: Endoscopy;  Laterality: N/A;   ENTEROSCOPY N/A 12/28/2015   Procedure: ENTEROSCOPY;  Surgeon: Carol Ada, MD;  Location: [place];  Service: Endoscopy;  Laterality: N/A;   ENTEROSCOPY N/A 02/20/2018   Procedure: ENTEROSCOPY;  Surgeon: Carol Ada, MD;  [facility];  Service: Endoscopy;  Laterality:  N/A;   ENTEROSCOPY N/A 03/27/2018   Procedure: ENTEROSCOPY;  Surgeon: Carol Ada, MD;  [facility];  Service: Endoscopy;  Laterality: N/A;   ENTEROSCOPY N/A 10/05/2020   Procedure: ENTEROSCOPY;  Surgeon: Carol Ada, MD;  [facility];  Service: Endoscopy;  Laterality: N/A;   ENTEROSCOPY N/A 05/11/2021   Procedure: ENTEROSCOPY;  Surgeon: Carol Ada, MD;  [facility];  Service: Endoscopy;  Laterality: N/A;   ENTEROSCOPY N/A 09/23/2021   Procedure: ENTEROSCOPY;  Surgeon: Doran Stabler, MD;  Location: [place];  Service: Gastroenterology;  Laterality: N/A;   ESOPHAGOGASTRODUODENOSCOPY N/A 01/08/2013   Procedure: ESOPHAGOGASTRODUODENOSCOPY (EGD);  Surgeon: Beryle Beams, MD;  Location: [facility];  Service: Endoscopy;  Laterality: N/A;   EYE SURGERY     GIVENS CAPSULE STUDY N/A 09/24/2021   Procedure: GIVENS CAPSULE STUDY;  Surgeon: Carol Ada, MD;  Location: [place];  Service: Gastroenterology;  Laterality: N/A;   HAND SURGERY     HEMOSTASIS CLIP PLACEMENT  05/11/2021   Procedure: HEMOSTASIS CLIP PLACEMENT;  Surgeon: Carol Ada, MD;  [facility];  Service: Endoscopy;;   HOT HEMOSTASIS N/A 12/28/2015   Procedure: HOT HEMOSTASIS (ARGON PLASMA COAGULATION/BICAP);  Surgeon: Carol Ada, MD;  Location: [facility] ENDOSCOPY;  Service: Endoscopy;  Laterality: N/A;   HOT HEMOSTASIS N/A 02/20/2018   Procedure: HOT HEMOSTASIS (ARGON PLASMA COAGULATION/BICAP);  Surgeon: Carol Ada, MD;  Location: [facility];  Service: Endoscopy;  Laterality: N/A;   HOT HEMOSTASIS N/A 03/27/2018   Procedure: HOT HEMOSTASIS (ARGON PLASMA COAGULATION/BICAP);  Surgeon: Carol Ada, MD;  Location: [facility];  Service: Endoscopy;  Laterality: N/A;   HOT HEMOSTASIS N/A 10/05/2020   Procedure: HOT HEMOSTASIS (ARGON PLASMA COAGULATION/BICAP);  Surgeon: Carol Ada, MD;  Location: [facility];  Service: Endoscopy;  Laterality: N/A;   HOT HEMOSTASIS N/A 05/11/2021   Procedure: HOT HEMOSTASIS (ARGON PLASMA COAGULATION/BICAP);  Surgeon: Carol Ada, MD;  Location: [facility];  Service: Endoscopy;  Laterality: N/A;   LEFT HEART CATHETERIZATION WITH CORONARY ANGIOGRAM N/A 03/09/2013   Procedure: LEFT HEART CATHETERIZATION WITH CORONARY ANGIOGRAM;  Surgeon: Laverda Page, MD;  Location: [facility] CATH LAB;  Service: Cardiovascular;  Laterality: N/A;   LOWER EXTREMITY ANGIOGRAM N/A 12/29/2012   Procedure: LOWER EXTREMITY ANGIOGRAM;  Surgeon: Laverda Page, MD;  Location: [facility] CATH LAB;  Service: Cardiovascular;  Laterality: N/A;   LOWER EXTREMITY ANGIOGRAM N/A 10/05/2013   Procedure: LOWER EXTREMITY ANGIOGRAM;  Surgeon: Laverda Page, MD;  Location: [facility] CATH LAB;  Service: Cardiovascular;  Laterality: N/A;   LOWER EXTREMITY ANGIOGRAPHY N/A 02/11/2017   Procedure: Lower Extremity Angiography;  Surgeon: Adrian Prows, MD;  Location: [place] CV LAB;  Service: Cardiovascular;  Laterality: N/A;   PERIPHERAL VASCULAR BALLOON ANGIOPLASTY  02/11/2017   Procedure: PERIPHERAL VASCULAR BALLOON ANGIOPLASTY;  Surgeon: Adrian Prows, MD;  Location: [place] CV LAB;  Service:  Cardiovascular;;  Right SFA   PERIPHERAL VASCULAR INTERVENTION Right 02/11/2017   Procedure: PERIPHERAL VASCULAR INTERVENTION;  Surgeon: Adrian Prows, MD;  Location: [place] CV LAB;  Service: Cardiovascular;  Laterality: Right;  Rt SFA   POLYPECTOMY  10/05/2020   Procedure: POLYPECTOMY;  Surgeon: Carol Ada, MD;  Location[facility];  Service: Endoscopy;;   stent in right leg  12/29/2012   for blood clot, Dr. Nadyne Coombes   TOTAL KNEE ARTHROPLASTY Left 04/18/2020   TOTAL KNEE ARTHROPLASTY Left 04/18/2020   Procedure: LEFT TOTAL KNEE ARTHROPLASTY;  Surgeon: Meredith Pel, MD;  Location: [place];  Service: Orthopedics;  Laterality:  Left;   TYMPANOMASTOIDECTOMY Left 03/08/2014   Procedure: TYMPANOMASTOIDECTOMY LEFT;  Surgeon: Ascencion Dike, MD;  Location: [place];  Service: ENT;  Laterality: Left;   TYMPANOMASTOIDECTOMY  2015   UTERINE FIBROID SURGERY      FAMILY HISTORY: The patient's family history includes Diabetes in her brother, brother, and mother; Heart disease in her father and mother; Hypertension in her brother, brother, brother, brother, brother, father, mother, sister, and sister.   SOCIAL HISTORY:  The patient  reports that she quit smoking about 15 months ago. Her smoking use included cigarettes. She started smoking about 56 years ago. She has a 32.40 pack-year smoking history. She has never used smokeless tobacco. She reports current alcohol use. She reports that she does not currently use drugs after having used the following drugs: Marijuana and Cocaine.  Review of Systems  Constitutional: Negative for chills and fever.  HENT:  Negative for hoarse voice and nosebleeds.   Eyes:  Negative for discharge, double vision and pain.  Cardiovascular:  Positive for claudication (stable). Negative for chest pain, dyspnea on exertion, leg swelling, near-syncope, orthopnea, palpitations, paroxysmal nocturnal dyspnea and syncope.  Respiratory:  Negative for hemoptysis and  shortness of breath.   Musculoskeletal:  Positive for arthritis and joint pain. Negative for muscle cramps and myalgias.  Gastrointestinal:  Negative for abdominal pain, constipation, diarrhea, hematemesis, hematochezia, melena, nausea and vomiting.  Neurological:  Negative for dizziness and light-headedness.    PHYSICAL EXAM:    09/12/2022    2:35 PM 09/12/2022    1:45 PM 09/07/2022   10:02 AM  Vitals with BMI  Height  4\' 11"    Weight  222 lbs   BMI  123456   Systolic [id number]  Diastolic 82 75 81  Pulse 73 57 53    Physical Exam  Constitutional: No distress.  Age appropriate, hemodynamically stable.   Neck: No JVD present.  Cardiovascular: Normal rate, regular rhythm, S1 normal, S2 normal and intact distal pulses. Exam reveals no gallop, no S3 and no S4.  No murmur heard. Pulses:      Femoral pulses are 1+ on the right side and 1+ on the left side.      Popliteal pulses are 0 on the right side and 0 on the left side.       Dorsalis pedis pulses are 2+ on the right side and 2+ on the left side.       Posterior tibial pulses are 0 on the right side and 0 on the left side.  Pulmonary/Chest: Effort normal and breath sounds normal. No stridor. She has no wheezes. She has no rales.  Abdominal: Soft. Bowel sounds are normal. She exhibits no distension. There is no abdominal tenderness.  Musculoskeletal:        General: No edema.     Cervical back: Neck supple.  Neurological: She is alert and oriented to person, place, and time. She has intact cranial nerves (2-12).  Skin: Skin is warm and moist.   CARDIAC DATABASE: EKG: 09/12/2022: Sinus bradycardia, 55 bpm, normal axis, without underlying ischemia or injury pattern.  Echocardiogram: 11/08/2021:  1. Left ventricular ejection fraction, by estimation, is 55 to 60%. The left ventricle has normal function. The left ventricle has no regional wall motion abnormalities. Left ventricular diastolic parameters are  consistent with Grade  I diastolic  dysfunction (impaired relaxation).   2. Right ventricular systolic function is moderately reduced. The right ventricular size is moderately enlarged. There is severely  elevated pulmonary artery systolic pressure.   3. Right atrial size was moderately dilated.   4. The mitral valve is abnormal. Trivial mitral valve regurgitation. No evidence of mitral stenosis.   5. Tricuspid valve regurgitation is mild to moderate.   6. Gradients stable to slightly lower compared to TTE done 08/29/21. The aortic valve is tricuspid. There is moderate calcification of the aortic valve. There is moderate thickening of the aortic valve. Aortic valve  regurgitation is not visualized. Mild  aortic valve stenosis.   7. Aortic dilatation noted. There is dilatation of the ascending aorta, measuring 43 mm.   8. The inferior vena cava is dilated in size with >50% respiratory variability, suggesting right atrial pressure of 8 mmHg   Stress Testing:  Lexiscan Tetrofosmin stress test 04/07/2022: Lexiscan nuclear stress test performed using 1-day protocol. SPECT imaging showed very small, mild intensity, reversible apical perfusion defect. Normal wall motion and myocardial thickening. Stress LVEF 53%. Low risk study.   Heart Catheterization: Coronary angiogram 02/27/2014: Mid RCA 60-70% stenosis, mid LAD 60-70% stenosis. FFR to the LAD, lesion insignificant. Medical therapy for CAD. Moderate aortic valve calcification.  Carotid artery duplex 08/05/2022: Duplex suggests stenosis in the right internal carotid artery (50-69%).  < 50% stenosis in the right external carotid artery. No evidence of significant stenosis in the left internal carotid artery.  <50% stenosis of the left external carotid artery. Antegrade left vertebral artery flow. Follow up in six months is appropriate if clinically indicated.    Peripheral arteriogram 02/11/2017: Successful PTA with DCB 5x150x2 InPact Admiral in the right proximal to  distal SFA and stenting of proximal SFA with DES, [place] 6x100 mm stent. 3 vessel r/o. Left mild disease and 2 vessel R/O. Mild disease left by angiogram 10/05/2013.   VAS US Aorta / IVC/Iliacs 08/17/2021 Calcifications noted throughout the abdominal aorta. There is no evidence of significant stenosis in the aortoiliac arteries, however, this is based on limited visualization.  ABI  08/17/2021 Right: Resting right ankle-brachial index is within normal range. No evidence of significant right lower extremity arterial disease. The right toe-brachial index is normal.   Left: Resting left ankle-brachial index is within normal range. No evidence of significant left lower extremity arterial disease. The left toe-brachial index is abnormal.  Lower extremity arterial duplex: 08/17/2021 Right: 50-74% stenosis noted in the deep femoral artery. Patent stent with no evidence of stenosis in the superficial femoral artery.  Ambulatory Blood Pressure & Weight Monitoring: Systolic Blood Pressure      mmHg  --          112.1 ([id number] - [id number]) Diastolic Blood Pressure     mmHg  --          65.2 (55.0 - 83.0) Heart Rate      bpm     --          69.7 (58.0 - 96.0)     09/10/22 8:14 AM         140      /           83        mmHg  61        bpm      09/04/22 9:09 AM         122      /           71        mmHg  71        bpm  09/02/22 9:13 AM         121      /           67        mmHg  85        bpm      08/30/22 10:10 AM         121      /           69        mmHg  70        bpm      08/29/22 9:17 AM           134      /           78        mmHg  68        bpm      08/28/22 7:39 AM           120      /           66        mmHg  96        bpm      08/27/22 9:15 AM           118      /           69        mmHg  73        bpm      08/26/22 8:49 AM           117      /           68        mmHg  75        bpm      08/23/22 10:09 AM         118      /           66        mmHg  79        bpm        Weight            lb         --           230.0 (221.4 - 236.6)   09/10/22 7:49 AM         226.2   lb                      09/02/22 10:09 AM       230.0   lb                      08/31/22 1:51 PM           230.4   lb                      08/29/22 9:28 AM           229.6   lb                      08/24/22 11:43 AM         221.4   lb  08/19/22 10:48 AM       233.0   lb                      08/17/22 12:07 PM       232.6   lb                      08/13/22 8:56 AM         236.6   lb  LABORATORY DATA:    Latest Ref Rng & Units 09/07/2022    4:03 AM 09/06/2022    5:04 AM 09/05/2022   10:49 AM  CBC  WBC 4.0 - 10.5 K/uL 7.7  4.6  4.0   Hemoglobin 12.0 - 15.0 g/dL 9.6  8.7  8.9   Hematocrit 36.0 - 46.0 % 35.3  30.8  31.3   Platelets 150 - 400 K/uL 523  456  573        Latest Ref Rng & Units 09/06/2022    5:04 AM 09/05/2022    8:26 AM 09/04/2022    3:00 PM  CMP  Glucose 70 - 99 mg/dL 140  107  120   BUN 8 - 23 mg/dL 10  10  13    Creatinine 0.44 - 1.00 mg/dL 0.75  0.63  0.49   Sodium 135 - 145 mmol/L 136  136  138   Potassium 3.5 - 5.1 mmol/L 3.9  3.8  3.7   Chloride 98 - 111 mmol/L 102  104  104   CO2 22 - 32 mmol/L 26  27  27    Calcium 8.9 - 10.3 mg/dL 8.8  8.5  8.9   Total Protein 6.5 - 8.1 g/dL  6.8  7.3   Total Bilirubin 0.3 - 1.2 mg/dL  0.7  0.4   Alkaline Phos 38 - 126 U/L  69  68   AST 15 - 41 U/L  10  14   ALT 0 - 44 U/L  7  8     Lipid Panel  Lab Results  Component Value Date   CHOL 138 03/26/2021   HDL 54 03/26/2021   LDLCALC 69 03/26/2021   LDLDIRECT 71 03/26/2021   TRIG 78 03/26/2021   CHOLHDL 4.5 05/12/2012     Lab Results  Component Value Date   HGBA1C 5.0 09/04/2022   HGBA1C 4.9 09/21/2021   HGBA1C 5.8 (H) 05/09/2021   No components found for: "NTPROBNP" Lab Results  Component Value Date   TSH 1.995 09/04/2022   TSH 2.703 06/20/2022   TSH 0.315 (L) 08/26/2021    Cardiac Panel (last 3 results) No results for input(s): "CKTOTAL", "CKMB",  "TROPONINIHS", "RELINDX" in the last 72 hours.  IMPRESSION:    ICD-10-CM   1. Chronic heart failure with preserved ejection fraction (HFpEF) (HCC)  I50.32 EKG 12-Lead    2. Acute on chronic diastolic CHF (congestive heart failure) (HCC)  I50.33     3. Atherosclerosis of native coronary artery of native heart without angina pectoris  I25.10     4. PAD (peripheral artery disease) (HCC)  I73.9     5. Carotid artery stenosis, asymptomatic, bilateral  I65.23     6. Essential hypertension  I10     7. Non-insulin dependent type 2 diabetes mellitus ([place])  E11.9     8. Type 2 diabetes mellitus with peripheral vascular disease (HCC)  E11.51     9. Mixed hyperlipidemia  E78.2     10. Former smoker  Z87.891         RECOMMENDATIONS: Phebe Ehlinger is a 72 y.o. female whose past medical history and cardiovascular risk factors include: Established coronary artery disease without angina pectoris, hypertension, hyperlipidemia, former smoker, peripheral artery disease status post PV angiogram with revascularization of the SFA, non-insulin-dependent diabetes, postmenopausal female, advanced age, obesity due to excess calories, history of prior GI bleed (most recent 08/2022 2 PRBCs and IV iron), COPD.   Chronic heart failure with preserved ejection fraction (HFpEF) ([place]) Last hospitalization September 2023.  Stage C, NYHA class II Currently on Lasix, Toprol-XL, Entresto. Not on SGLT2 inhibitors due to history of urinary tract infections. Ambulatory blood pressure and weight log reviewed -results noted above for further reference. Remains euvolemic on physical examination. Continue current medical therapy.  Atherosclerosis of native coronary artery of native heart without angina pectoris Denies angina pectoris. EKG nonischemic. Since last office visit she had a stress test which was overall low risk. No additional testing warranted at this time.  PAD (peripheral artery disease)  (HCC) ABIs are within acceptable limits bilaterally. SFA stent is patent. She does have disease in the right deep femoral artery based on the arterial duplex; however, overall is asymptomatic and therefore we will treat medically. Continue statin therapy, Zetia, cilostazol.  Increase ambulation as tolerated.  Carotid artery stenosis, asymptomatic, bilateral Asymptomatic. Most recent carotid duplex from February 2024 notes improvement in the degree of left ICA stenosis.  Right ICA stenosis remains stable. Continue medical therapy. Reemphasized importance of complete smoking cessation.  Essential hypertension Home blood pressures are well-controlled as noted above. No changes warranted at this time.   Non-insulin dependent type 2 diabetes mellitus (HCC) Last hemoglobin A1c as of March 2023 well-controlled. Currently managed by primary care provider.  Former smoker Quit smoking in March 2023. Currently diagnoses COPD on oxygen dependence. Continues to follow with pulmonary medicine.  Patient will have a fasting lipid profile with PCP in the coming weeks.  I have asked her to send me a copy for reference.  FINAL MEDICATION LIST END OF ENCOUNTER: No orders of the defined types were placed in this encounter.     Current Outpatient Medications:    acetaminophen (TYLENOL) 500 MG tablet, Take 1,000 mg by mouth as needed for moderate pain., Disp: , Rfl:    albuterol (PROVENTIL) (2.5 MG/3ML) 0.083% nebulizer solution, Take 2.5 mg by nebulization in the morning, at noon, and at bedtime., Disp: , Rfl:    albuterol (VENTOLIN HFA) 108 (90 Base) MCG/ACT inhaler, Inhale 1-2 puffs into the lungs every 6 (six) hours as needed for wheezing or shortness of breath., Disp: 8.5 g, Rfl: 1   [START ON 09/13/2022] aspirin EC (ASPIRIN LOW DOSE) 81 MG tablet, Take 1 tablet (81 mg total) by mouth daily. Resume after 1 week, on 09/13/2022 as long as you are not having black stools., Disp: 30 tablet, Rfl: 0    atorvastatin (LIPITOR) 80 MG tablet, Take 80 mg by mouth daily., Disp: , Rfl:    cefdinir (OMNICEF) 300 MG capsule, Take 300 mg by mouth 2 (two) times daily., Disp: , Rfl:    Cholecalciferol (VITAMIN D-3 PO), Take 1 capsule by mouth 2 (two) times daily., Disp: , Rfl:    [START ON 09/13/2022] cilostazol (PLETAL) 100 MG tablet, Take 1 tablet (100 mg total) by mouth 2 (two) times daily. Resume after 1 week, on 09/13/2022 as long as you are not having black stools., Disp: , Rfl:    ezetimibe (ZETIA) 10 MG  tablet, TAKE 1 TABLET BY MOUTH DAILY (Patient taking differently: Take 10 mg by mouth daily.), Disp: 90 tablet, Rfl: 3   ferrous sulfate 325 (65 FE) MG tablet, Take 325 mg by mouth daily., Disp: , Rfl:    fluticasone (FLONASE) 50 MCG/ACT nasal spray, Place 1 spray into both nostrils daily as needed for allergies., Disp: , Rfl:    Fluticasone-Umeclidin-Vilant (TRELEGY ELLIPTA) 100-62.5-25 MCG/ACT AEPB, Inhale 1 puff into the lungs daily. (Patient taking differently: Inhale 2 puffs into the lungs daily.), Disp: 1 each, Rfl: 5   furosemide (LASIX) 40 MG tablet, Take 1 tablet (40 mg total) by mouth 2 (two) times daily., Disp: 60 tablet, Rfl: 0   gabapentin (NEURONTIN) 600 MG tablet, Take 1 tablet (600 mg total) by mouth at bedtime. (Patient taking differently: Take 600 mg by mouth 2 (two) times daily.), Disp: 30 tablet, Rfl: 11   ipratropium-albuterol (DUONEB) 0.5-2.5 (3) MG/3ML SOLN, Take 3 mLs by nebulization 3 (three) times daily. (Patient taking differently: Take 3 mLs by nebulization as needed (wheezing).), Disp: 270 mL, Rfl: 1   LINZESS 145 MCG CAPS capsule, Take 145 mcg by mouth daily as needed (Constipation)., Disp: , Rfl:    methocarbamol (ROBAXIN) 500 MG tablet, Take 500 mg by mouth daily., Disp: , Rfl:    metoprolol succinate (TOPROL XL) 25 MG 24 hr tablet, Take 1 tablet (25 mg total) by mouth daily., Disp: 30 tablet, Rfl: 2   nitroGLYCERIN (NITROSTAT) 0.4 MG SL tablet, Place 1 tablet (0.4 mg total)  under the tongue every 5 (five) minutes as needed for chest pain., Disp: 90 tablet, Rfl: 3   octreotide (SANDOSTATIN) 100 MCG/ML SOLN injection, Inject 1 mL (100 mcg total) into the skin 3 (three) times daily., Disp: 90 mL, Rfl: 2   OXYGEN, Inhale 3 L into the lungs continuous., Disp: , Rfl:    pantoprazole (PROTONIX) 40 MG tablet, Take 1 tablet (40 mg total) by mouth 2 (two) times daily. (Patient taking differently: Take 40 mg by mouth daily.), Disp: 60 tablet, Rfl: 0   Potassium Chloride ER 20 MEQ TBCR, Take 20 mEq by mouth daily., Disp: , Rfl:    sacubitril-valsartan (ENTRESTO) 24-26 MG, Take 1 tablet by mouth 2 (two) times daily., Disp: 60 tablet, Rfl: 2   SYRINGE-NEEDLE, DISP, 3 ML 25G X 5/8" 3 ML MISC, Inject 1 Syringe into the skin 3 (three) times daily. For use with Octreodide injection., Disp: 100 each, Rfl: 2  Orders Placed This Encounter  Procedures   EKG 12-Lead   --Continue cardiac medications as reconciled in final medication list. --Return in about 6 months (around 03/15/2023) for Follow up, heart failure management.. Or sooner if needed. --Continue follow-up with your primary care physician regarding the management of your other chronic comorbid conditions.  Patient's questions and concerns were addressed to her satisfaction. She voices understanding of the instructions provided during this encounter.   This note was created using a voice recognition software as a result there may be grammatical errors inadvertently enclosed that do not reflect the nature of this encounter. Every attempt is made to correct such errors.  Rex Kras, [place], [facility]  Pager:  [phone] Office: [phone]

## 2022-09-18 ENCOUNTER — Other Ambulatory Visit: Payer: Self-pay

## 2022-09-18 ENCOUNTER — Inpatient Hospital Stay (HOSPITAL_BASED_OUTPATIENT_CLINIC_OR_DEPARTMENT_OTHER): Payer: Medicare HMO | Admitting: Internal Medicine

## 2022-09-18 ENCOUNTER — Inpatient Hospital Stay: Payer: Medicare HMO | Attending: Internal Medicine

## 2022-09-18 ENCOUNTER — Other Ambulatory Visit: Payer: Self-pay | Admitting: Internal Medicine

## 2022-09-18 VITALS — BP 113/52 | HR 77 | Temp 98.6°F | Resp 16 | Wt 225.0 lb

## 2022-09-18 DIAGNOSIS — D509 Iron deficiency anemia, unspecified: Secondary | ICD-10-CM | POA: Insufficient documentation

## 2022-09-18 DIAGNOSIS — K5521 Angiodysplasia of colon with hemorrhage: Secondary | ICD-10-CM

## 2022-09-18 DIAGNOSIS — E1151 Type 2 diabetes mellitus with diabetic peripheral angiopathy without gangrene: Secondary | ICD-10-CM | POA: Diagnosis not present

## 2022-09-18 DIAGNOSIS — D500 Iron deficiency anemia secondary to blood loss (chronic): Secondary | ICD-10-CM

## 2022-09-18 DIAGNOSIS — I10 Essential (primary) hypertension: Secondary | ICD-10-CM | POA: Diagnosis not present

## 2022-09-18 DIAGNOSIS — Z90710 Acquired absence of both cervix and uterus: Secondary | ICD-10-CM | POA: Diagnosis not present

## 2022-09-18 LAB — COMPREHENSIVE METABOLIC PANEL
ALT: 6 U/L (ref 0–44)
AST: 13 U/L — ABNORMAL LOW (ref 15–41)
Albumin: 4 g/dL (ref 3.5–5.0)
Alkaline Phosphatase: 87 U/L (ref 38–126)
Anion gap: 6 (ref 5–15)
BUN: 16 mg/dL (ref 8–23)
CO2: 33 mmol/L — ABNORMAL HIGH (ref 22–32)
Calcium: 9.4 mg/dL (ref 8.9–10.3)
Chloride: 101 mmol/L (ref 98–111)
Creatinine, Ser: 0.93 mg/dL (ref 0.44–1.00)
GFR, Estimated: >60 mL/min (ref >60)
Glucose, Bld: 148 mg/dL — ABNORMAL HIGH (ref 70–99)
Potassium: 3.9 mmol/L (ref 3.5–5.1)
Sodium: 140 mmol/L (ref 135–145)
Total Bilirubin: 0.5 mg/dL (ref 0.3–1.2)
Total Protein: 8 g/dL (ref 6.5–8.1)

## 2022-09-18 LAB — IRON AND IRON BINDING CAPACITY (CC-WL,HP ONLY)
Iron: 30 ug/dL (ref 28–170)
Saturation Ratios: 8 % — ABNORMAL LOW (ref 10.4–31.8)
TIBC: 386 ug/dL (ref 250–450)
UIBC: 356 ug/dL (ref 148–442)

## 2022-09-18 LAB — CBC WITH DIFFERENTIAL (CANCER CENTER ONLY)
Abs Immature Granulocytes: 0.02 10*3/uL (ref 0.00–0.07)
Basophils Absolute: 0.1 10*3/uL (ref 0.0–0.1)
Basophils Relative: 1 %
Eosinophils Absolute: 0.3 10*3/uL (ref 0.0–0.5)
Eosinophils Relative: 3 %
HCT: 40 % (ref 36.0–46.0)
Hemoglobin: 11.6 g/dL — ABNORMAL LOW (ref 12.0–15.0)
Immature Granulocytes: 0 %
Lymphocytes Relative: 13 %
Lymphs Abs: 1.3 10*3/uL (ref 0.7–4.0)
MCH: 24.6 pg — ABNORMAL LOW (ref 26.0–34.0)
MCHC: 29 g/dL — ABNORMAL LOW (ref 30.0–36.0)
MCV: 84.7 fL (ref 80.0–100.0)
Monocytes Absolute: 0.7 10*3/uL (ref 0.1–1.0)
Monocytes Relative: 7 %
Neutro Abs: 7.4 10*3/uL (ref 1.7–7.7)
Neutrophils Relative %: 76 %
Platelet Count: 489 10*3/uL — ABNORMAL HIGH (ref 150–400)
RBC: 4.72 MIL/uL (ref 3.87–5.11)
RDW: 21.1 % — ABNORMAL HIGH (ref 11.5–15.5)
WBC Count: 9.7 10*3/uL (ref 4.0–10.5)
nRBC: 0 % (ref 0.0–0.2)

## 2022-09-18 LAB — FERRITIN: Ferritin: 46 ng/mL (ref 11–307)

## 2022-09-18 NOTE — Progress Notes (Signed)
[place] [phone]   [phone]  OFFICE PROGRESS NOTE  Carla Alberts, MD [place] [address]  DIAGNOSIS: Iron deficiency anemia likely secondary to gastrointestinal hemorrhage.  PRIOR THERAPY: Iron infusion with Venofer 300 Mg IV weekly for 3 weeks.  Last dose was giving February 2024.  CURRENT THERAPY: Over-the-counter ferrous sulfate  INTERVAL HISTORY: [name] 72 y.o. female returns to the clinic today for follow-up visit.  The patient is feeling fine today with no concerning complaints.  She felt much better after receiving the iron infusion.  She was seen by Dr. Carlean Jews for evaluation of her anemia and he started her on octreotide but did not plan any endoscopy or colonoscopy.  She denied having any current chest pain but has shortness of breath with exertion with no cough or hemoptysis.  She has no nausea, vomiting, diarrhea or constipation.  She has no headache or visual changes.  She is here today for evaluation and repeat blood work.  MEDICAL HISTORY: Past Medical History:  Diagnosis Date   Anemia 12/26/2015   Anxiety    Arthritis    Blood transfusion without reported diagnosis    Chronic pain    resolved per pt 04/12/20   COPD (chronic obstructive pulmonary disease) (HCC)    Coronary artery disease    Depression    Diabetes mellitus without complication (HCC)    type 2   GERD (gastroesophageal reflux disease)    Heart murmur    since birth; Echo 02/18/19: LVEF 0000000, grade 1 diastolic dysfunction, mild AS (mean grad 12 mmHg), trace MR/PR, mild TR, PASP 32 mmHg     Hyperlipemia    Hypertension    PVD (peripheral vascular disease) ([place])    right SFA stent 02/11/17 by Dr. Einar Gip   Sleep apnea    does not use cpap   Wears glasses     ALLERGIES:  has No Known Allergies.  MEDICATIONS:  Current Outpatient Medications  Medication Sig Dispense Refill   acetaminophen (TYLENOL) 500 MG tablet Take 1,000  mg by mouth as needed for moderate pain.     albuterol (PROVENTIL) (2.5 MG/3ML) 0.083% nebulizer solution Take 2.5 mg by nebulization in the morning, at noon, and at bedtime.     albuterol (VENTOLIN HFA) 108 (90 Base) MCG/ACT inhaler Inhale 1-2 puffs into the lungs every 6 (six) hours as needed for wheezing or shortness of breath. 8.5 g 1   aspirin EC (ASPIRIN LOW DOSE) 81 MG tablet Take 1 tablet (81 mg total) by mouth daily. Resume after 1 week, on 09/13/2022 as long as you are not having black stools. 30 tablet 0   atorvastatin (LIPITOR) 80 MG tablet Take 80 mg by mouth daily.     cefdinir (OMNICEF) 300 MG capsule Take 300 mg by mouth 2 (two) times daily.     Cholecalciferol (VITAMIN D-3 PO) Take 1 capsule by mouth 2 (two) times daily.     cilostazol (PLETAL) 100 MG tablet Take 1 tablet (100 mg total) by mouth 2 (two) times daily. Resume after 1 week, on 09/13/2022 as long as you are not having black stools.     ezetimibe (ZETIA) 10 MG tablet TAKE 1 TABLET BY MOUTH DAILY (Patient taking differently: Take 10 mg by mouth daily.) 90 tablet 3   ferrous sulfate 325 (65 FE) MG tablet Take 325 mg by mouth daily.     fluticasone (FLONASE) 50 MCG/ACT nasal spray Place 1 spray into  both nostrils daily as needed for allergies.     Fluticasone-Umeclidin-Vilant (TRELEGY ELLIPTA) 100-62.5-25 MCG/ACT AEPB Inhale 1 puff into the lungs daily. (Patient taking differently: Inhale 2 puffs into the lungs daily.) 1 each 5   furosemide (LASIX) 40 MG tablet Take 1 tablet (40 mg total) by mouth 2 (two) times daily. 60 tablet 0   gabapentin (NEURONTIN) 600 MG tablet Take 1 tablet (600 mg total) by mouth at bedtime. (Patient taking differently: Take 600 mg by mouth 2 (two) times daily.) 30 tablet 11   ipratropium-albuterol (DUONEB) 0.5-2.5 (3) MG/3ML SOLN Take 3 mLs by nebulization 3 (three) times daily. (Patient taking differently: Take 3 mLs by nebulization as needed (wheezing).) 270 mL 1   LINZESS 145 MCG CAPS capsule Take  145 mcg by mouth daily as needed (Constipation).     methocarbamol (ROBAXIN) 500 MG tablet Take 500 mg by mouth daily.     metoprolol succinate (TOPROL XL) 25 MG 24 hr tablet Take 1 tablet (25 mg total) by mouth daily. 30 tablet 2   nitroGLYCERIN (NITROSTAT) 0.4 MG SL tablet Place 1 tablet (0.4 mg total) under the tongue every 5 (five) minutes as needed for chest pain. 90 tablet 3   octreotide (SANDOSTATIN) 100 MCG/ML SOLN injection Inject 1 mL (100 mcg total) into the skin 3 (three) times daily. 90 mL 2   OXYGEN Inhale 3 L into the lungs continuous.     pantoprazole (PROTONIX) 40 MG tablet Take 1 tablet (40 mg total) by mouth 2 (two) times daily. (Patient taking differently: Take 40 mg by mouth daily.) 60 tablet 0   Potassium Chloride ER 20 MEQ TBCR Take 20 mEq by mouth daily.     sacubitril-valsartan (ENTRESTO) 24-26 MG Take 1 tablet by mouth 2 (two) times daily. 60 tablet 2   SYRINGE-NEEDLE, DISP, 3 ML 25G X 5/8" 3 ML MISC Inject 1 Syringe into the skin 3 (three) times daily. For use with Octreodide injection. 100 each 2   No current facility-administered medications for this visit.    SURGICAL HISTORY:  Past Surgical History:  Procedure Laterality Date   ABDOMINAL HYSTERECTOMY     CARDIAC CATHETERIZATION     CATARACT EXTRACTION     CESAREAN SECTION     COLONOSCOPY N/A 01/08/2013   Procedure: COLONOSCOPY;  Surgeon: Beryle Beams, MD;  Location: [facility];  Service: Endoscopy;  Laterality: N/A;   COLONOSCOPY N/A 12/28/2015   Procedure: COLONOSCOPY;  Surgeon: Carol Ada, MD;  Location: [place];  Service: Endoscopy;  Laterality: N/A;   COLONOSCOPY WITH PROPOFOL N/A 10/05/2020   Procedure: COLONOSCOPY WITH PROPOFOL;  Surgeon: Carol Ada, MD;  [facility];  Service: Endoscopy;  Laterality: N/A;   ENTEROSCOPY N/A 12/28/2015   Procedure: ENTEROSCOPY;  Surgeon: Carol Ada, MD;  Location: [place];  Service: Endoscopy;  Laterality: N/A;   ENTEROSCOPY N/A 02/20/2018    Procedure: ENTEROSCOPY;  Surgeon: Carol Ada, MD;  [facility];  Service: Endoscopy;  Laterality: N/A;   ENTEROSCOPY N/A 03/27/2018   Procedure: ENTEROSCOPY;  Surgeon: Carol Ada, MD;  [facility];  Service: Endoscopy;  Laterality: N/A;   ENTEROSCOPY N/A 10/05/2020   Procedure: ENTEROSCOPY;  Surgeon: Carol Ada, MD;  [facility];  Service: Endoscopy;  Laterality: N/A;   ENTEROSCOPY N/A 05/11/2021   Procedure: ENTEROSCOPY;  Surgeon: Carol Ada, MD;  [facility];  Service: Endoscopy;  Laterality: N/A;   ENTEROSCOPY N/A 09/23/2021   Procedure: ENTEROSCOPY;  Surgeon: Doran Stabler, MD;  Location: [facility];  Service: Gastroenterology;  Laterality: N/A;   ESOPHAGOGASTRODUODENOSCOPY N/A 01/08/2013   Procedure: ESOPHAGOGASTRODUODENOSCOPY (EGD);  Surgeon: Beryle Beams, MD;  Location: [facility];  Service: Endoscopy;  Laterality: N/A;   EYE SURGERY     GIVENS CAPSULE STUDY N/A 09/24/2021   Procedure: GIVENS CAPSULE STUDY;  Surgeon: Carol Ada, MD;  Location: [place];  Service: Gastroenterology;  Laterality: N/A;   HAND SURGERY     HEMOSTASIS CLIP PLACEMENT  05/11/2021   Procedure: HEMOSTASIS CLIP PLACEMENT;  Surgeon: Carol Ada, MD;  [facility];  Service: Endoscopy;;   HOT HEMOSTASIS N/A 12/28/2015   Procedure: HOT HEMOSTASIS (ARGON PLASMA COAGULATION/BICAP);  Surgeon: Carol Ada, MD;  Location: [facility] ENDOSCOPY;  Service: Endoscopy;  Laterality: N/A;   HOT HEMOSTASIS N/A 02/20/2018   Procedure: HOT HEMOSTASIS (ARGON PLASMA COAGULATION/BICAP);  Surgeon: Carol Ada, MD;  Location: [facility];  Service: Endoscopy;  Laterality: N/A;   HOT HEMOSTASIS N/A 03/27/2018   Procedure: HOT HEMOSTASIS (ARGON PLASMA COAGULATION/BICAP);  Surgeon: Carol Ada, MD;  Location: [facility];  Service: Endoscopy;  Laterality: N/A;   HOT HEMOSTASIS N/A 10/05/2020   Procedure: HOT HEMOSTASIS (ARGON PLASMA COAGULATION/BICAP);  Surgeon: Carol Ada, MD;  Location: [facility];  Service: Endoscopy;  Laterality: N/A;   HOT HEMOSTASIS N/A 05/11/2021   Procedure: HOT HEMOSTASIS (ARGON PLASMA COAGULATION/BICAP);  Surgeon: Carol Ada, MD;  Location: [facility];  Service: Endoscopy;  Laterality: N/A;   LEFT HEART CATHETERIZATION WITH CORONARY ANGIOGRAM N/A 03/09/2013   Procedure: LEFT HEART CATHETERIZATION WITH CORONARY ANGIOGRAM;  Surgeon: Laverda Page, MD;  Location: [facility] CATH LAB;  Service: Cardiovascular;  Laterality: N/A;   LOWER EXTREMITY ANGIOGRAM N/A 12/29/2012   Procedure: LOWER EXTREMITY ANGIOGRAM;  Surgeon: Laverda Page, MD;  Location: [facility] CATH LAB;  Service: Cardiovascular;  Laterality: N/A;   LOWER EXTREMITY ANGIOGRAM N/A 10/05/2013   Procedure: LOWER EXTREMITY ANGIOGRAM;  Surgeon: Laverda Page, MD;  Location: [facility] CATH LAB;  Service: Cardiovascular;  Laterality: N/A;   LOWER EXTREMITY ANGIOGRAPHY N/A 02/11/2017   Procedure: Lower Extremity Angiography;  Surgeon: Adrian Prows, MD;  Location: [place] CV LAB;  Service: Cardiovascular;  Laterality: N/A;   PERIPHERAL VASCULAR BALLOON ANGIOPLASTY  02/11/2017   Procedure: PERIPHERAL VASCULAR BALLOON ANGIOPLASTY;  Surgeon: Adrian Prows, MD;  Location: [place] CV LAB;  Service: Cardiovascular;;  Right SFA   PERIPHERAL VASCULAR INTERVENTION Right 02/11/2017   Procedure: PERIPHERAL VASCULAR INTERVENTION;  Surgeon: Adrian Prows, MD;  Location: [place] CV LAB;  Service: Cardiovascular;  Laterality: Right;  Rt SFA   POLYPECTOMY  10/05/2020   Procedure: POLYPECTOMY;  Surgeon: Carol Ada, MD;  [facility];  Service: Endoscopy;;   stent in right leg  12/29/2012   for blood clot, Dr. Nadyne Coombes   TOTAL KNEE ARTHROPLASTY Left 04/18/2020   TOTAL KNEE ARTHROPLASTY Left 04/18/2020   Procedure: LEFT TOTAL KNEE ARTHROPLASTY;  Surgeon: Meredith Pel, MD;  Location: [place];  Service: Orthopedics;  Laterality: Left;   TYMPANOMASTOIDECTOMY Left 03/08/2014   Procedure:  TYMPANOMASTOIDECTOMY LEFT;  Surgeon: Ascencion Dike, MD;  Location: [place];  Service: ENT;  Laterality: Left;   TYMPANOMASTOIDECTOMY  2015   UTERINE FIBROID SURGERY      REVIEW OF SYSTEMS:  A comprehensive review of systems was negative except for: Constitutional: positive for fatigue   PHYSICAL EXAMINATION: General appearance: alert, cooperative, fatigued, and no distress Head: Normocephalic, without obvious abnormality, atraumatic Neck: no adenopathy, no JVD, supple, symmetrical, trachea midline, and thyroid  not enlarged, symmetric, no tenderness/mass/nodules Lymph nodes: Cervical, supraclavicular, and axillary nodes normal. Resp: clear to auscultation bilaterally Back: symmetric, no curvature. ROM normal. No CVA tenderness. Cardio: regular rate and rhythm, S1, S2 normal, no murmur, click, rub or gallop GI: soft, non-tender; bowel sounds normal; no masses,  no organomegaly Extremities: extremities normal, atraumatic, no cyanosis or edema  ECOG PERFORMANCE STATUS: 1 - Symptomatic but completely ambulatory  Blood pressure (!) 113/52, pulse 77, temperature 98.6 F (37 C), resp. rate 16, weight 225 lb (102.1 kg), SpO2 96 %.  LABORATORY DATA: Lab Results  Component Value Date   WBC 9.7 09/18/2022   HGB 11.6 (L) 09/18/2022   HCT 40.0 09/18/2022   MCV 84.7 09/18/2022   PLT 489 (H) 09/18/2022      Chemistry      Component Value Date/Time   NA 136 09/06/2022 0504   NA 138 03/26/2021 1328   K 3.9 09/06/2022 0504   CL 102 09/06/2022 0504   CO2 26 09/06/2022 0504   BUN 10 09/06/2022 0504   BUN 12 03/26/2021 1328   CREATININE 0.75 09/06/2022 0504   CREATININE 0.71 06/20/2022 1008      Component Value Date/Time   CALCIUM 8.8 (L) 09/06/2022 0504   ALKPHOS 69 09/05/2022 0826   AST 10 (L) 09/05/2022 0826   AST 12 (L) 06/20/2022 1008   ALT 7 09/05/2022 0826   ALT 6 06/20/2022 1008   BILITOT 0.7 09/05/2022 0826   BILITOT 0.3 06/20/2022 1008       RADIOGRAPHIC  STUDIES: DG Chest 2 View  Result Date: 09/04/2022 CLINICAL DATA:  Shortness of breath. EXAM: CHEST - 2 VIEW COMPARISON:  Chest radiographs 02/25/2022, 11/28/2021 FINDINGS: Cardiac silhouette is again moderately enlarged. Mediastinal contours are within normal limits with moderate calcification within the aortic arch. Mild bilateral mid and lower lung interstitial thickening and areas of linear scarring appear unchanged. No pleural effusion or pneumothorax. Moderate multilevel degenerative disc changes of the thoracic spine. IMPRESSION: 1. No acute lung process. 2. Mild bilateral mid and lower lung interstitial thickening and linear densities appear unchanged. This again may represent mild interstitial pulmonary edema and/or scarring. Electronically Signed   By: Yvonne Kendall M.D.   On: 09/04/2022 14:50    ASSESSMENT AND PLAN: This is a very pleasant 72 years old African-American female with iron deficiency anemia likely secondary to gastrointestinal blood loss.  The patient started treatment with Venofer 300 Mg IV weekly for 3 weeks and tolerated it fairly well. Repeat CBC today showed improvement of her hemoglobin up to 11.6 and hematocrit of 40.0. Iron study, ferritin and comprehensive metabolic panel are still pending. I recommended for the patient to continue on the oral iron tablets for now. If the pending iron studies showed significant deficiency, I will arrange for the patient to receive iron infusion again. I will see her back for follow-up visit in 3 months for evaluation and repeat blood work. She was advised to call immediately if she has any concerning symptoms in the interval. The patient voices understanding of current disease status and treatment options and is in agreement with the current care plan.  All questions were answered. The patient knows to call the clinic with any problems, questions or concerns. We can certainly see the patient much sooner if necessary. The total time  spent in the appointment was 20 minutes.  Disclaimer: This note was dictated with voice recognition software. Similar sounding words can inadvertently be transcribed and may not be corrected upon  review.

## 2022-09-24 ENCOUNTER — Other Ambulatory Visit: Payer: Self-pay

## 2022-09-25 ENCOUNTER — Telehealth: Payer: Self-pay | Admitting: Pharmacy Technician

## 2022-09-25 NOTE — Telephone Encounter (Signed)
Auth Submission: APPROVED Site of care: Site of care: [facility] Payer: [employer] Medication & CPT/J Code(s) submitted: Feraheme (ferumoxytol) [id number] Route of submission (phone, fax, portal):  Phone [phone] Fax [phone] Auth type: Buy/Bill Units/visits requested: 2 Reference number: [id number] Approval from: 09/20/22 to 06/24/23

## 2022-09-30 ENCOUNTER — Other Ambulatory Visit (HOSPITAL_COMMUNITY): Payer: Self-pay

## 2022-09-30 ENCOUNTER — Telehealth: Payer: Self-pay | Admitting: Medical Oncology

## 2022-09-30 NOTE — Telephone Encounter (Signed)
Pt received a letter  ( 09/10/22) from [facility] stating that her plan may not cover Sandostatin TID. [facility] dispensed it  09/10/2022 Per [facility] pharmacist  her Sandostatin is authorized -pt notified.

## 2022-10-10 ENCOUNTER — Encounter: Payer: Self-pay | Admitting: Internal Medicine

## 2022-10-10 ENCOUNTER — Other Ambulatory Visit (HOSPITAL_COMMUNITY): Payer: Self-pay

## 2022-10-11 ENCOUNTER — Ambulatory Visit (INDEPENDENT_AMBULATORY_CARE_PROVIDER_SITE_OTHER): Payer: Medicare HMO

## 2022-10-11 VITALS — BP 130/78 | HR 63 | Temp 97.5°F | Resp 20 | Ht 59.0 in | Wt 226.6 lb

## 2022-10-11 DIAGNOSIS — D649 Anemia, unspecified: Secondary | ICD-10-CM

## 2022-10-11 DIAGNOSIS — D500 Iron deficiency anemia secondary to blood loss (chronic): Secondary | ICD-10-CM | POA: Diagnosis not present

## 2022-10-11 DIAGNOSIS — K922 Gastrointestinal hemorrhage, unspecified: Secondary | ICD-10-CM | POA: Diagnosis not present

## 2022-10-11 MED ORDER — DIPHENHYDRAMINE HCL 25 MG PO CAPS
25.0000 mg | ORAL_CAPSULE | Freq: Once | ORAL | Status: AC
  Administered 2022-10-11: 25 mg via ORAL
  Filled 2022-10-11: qty 1

## 2022-10-11 MED ORDER — SODIUM CHLORIDE 0.9 % IV SOLN
510.0000 mg | Freq: Once | INTRAVENOUS | Status: AC
  Administered 2022-10-11: 510 mg via INTRAVENOUS
  Filled 2022-10-11: qty 17

## 2022-10-11 MED ORDER — ACETAMINOPHEN 325 MG PO TABS
650.0000 mg | ORAL_TABLET | Freq: Once | ORAL | Status: AC
  Administered 2022-10-11: 650 mg via ORAL
  Filled 2022-10-11: qty 2

## 2022-10-11 NOTE — Progress Notes (Signed)
Diagnosis: Chronic anemia  Provider:  Chilton Greathouse MD  Procedure: Infusion  IV Type: Peripheral, IV Location: L Antecubital  Feraheme (Ferumoxytol), Dose: 510 mg  Infusion Start Time: 1501  Infusion Stop Time: 1522  Post Infusion IV Care: Observation period completed and Peripheral IV Discontinued  Discharge: Condition: Stable, Destination: Home . AVS Provided  Performed by:  Wyvonne Lenz, RN

## 2022-10-14 ENCOUNTER — Other Ambulatory Visit (HOSPITAL_COMMUNITY): Payer: Self-pay

## 2022-10-18 ENCOUNTER — Ambulatory Visit (INDEPENDENT_AMBULATORY_CARE_PROVIDER_SITE_OTHER): Payer: Medicare HMO | Admitting: *Deleted

## 2022-10-18 VITALS — BP 98/56 | HR 62 | Temp 97.9°F | Resp 16 | Ht 59.0 in | Wt 226.2 lb

## 2022-10-18 DIAGNOSIS — D649 Anemia, unspecified: Secondary | ICD-10-CM

## 2022-10-18 DIAGNOSIS — K922 Gastrointestinal hemorrhage, unspecified: Secondary | ICD-10-CM

## 2022-10-18 DIAGNOSIS — D500 Iron deficiency anemia secondary to blood loss (chronic): Secondary | ICD-10-CM

## 2022-10-18 MED ORDER — DIPHENHYDRAMINE HCL 25 MG PO CAPS
25.0000 mg | ORAL_CAPSULE | Freq: Once | ORAL | Status: AC
  Administered 2022-10-18: 25 mg via ORAL
  Filled 2022-10-18: qty 1

## 2022-10-18 MED ORDER — SODIUM CHLORIDE 0.9 % IV SOLN
510.0000 mg | Freq: Once | INTRAVENOUS | Status: AC
  Administered 2022-10-18: 510 mg via INTRAVENOUS
  Filled 2022-10-18: qty 17

## 2022-10-18 MED ORDER — ACETAMINOPHEN 325 MG PO TABS
650.0000 mg | ORAL_TABLET | Freq: Once | ORAL | Status: AC
  Administered 2022-10-18: 650 mg via ORAL
  Filled 2022-10-18: qty 2

## 2022-10-18 NOTE — Progress Notes (Signed)
Diagnosis: Iron Deficiency Anemia  Provider:  Chilton Greathouse MD  Procedure: IV Infusion  IV Type: Peripheral, IV Location: L Forearm  Feraheme (Ferumoxytol), Dose: 510 mg  Infusion Start Time: 1153 am  Infusion Stop Time: 1219 pm  Post Infusion IV Care: Observation period completed and Peripheral IV Discontinued  Discharge: Condition: Good, Destination: Home . AVS Provided  Performed by:  Forrest Moron, RN

## 2022-10-21 ENCOUNTER — Other Ambulatory Visit (HOSPITAL_COMMUNITY): Payer: Self-pay

## 2022-10-21 ENCOUNTER — Ambulatory Visit
Admission: RE | Admit: 2022-10-21 | Discharge: 2022-10-21 | Disposition: A | Discharge status: Home / Self Care | Payer: Medicaid Other | Source: Ambulatory Visit | Attending: Family Medicine | Admitting: Family Medicine

## 2022-10-21 DIAGNOSIS — E2839 Other primary ovarian failure: Secondary | ICD-10-CM

## 2022-10-25 ENCOUNTER — Other Ambulatory Visit (HOSPITAL_COMMUNITY): Payer: Self-pay

## 2022-11-28 ENCOUNTER — Other Ambulatory Visit: Payer: Self-pay | Admitting: Cardiology

## 2022-11-28 DIAGNOSIS — I5033 Acute on chronic diastolic (congestive) heart failure: Secondary | ICD-10-CM

## 2022-11-30 ENCOUNTER — Other Ambulatory Visit (HOSPITAL_COMMUNITY): Payer: Self-pay

## 2022-12-25 ENCOUNTER — Other Ambulatory Visit (INDEPENDENT_AMBULATORY_CARE_PROVIDER_SITE_OTHER): Payer: Medicare HMO

## 2022-12-25 ENCOUNTER — Ambulatory Visit (INDEPENDENT_AMBULATORY_CARE_PROVIDER_SITE_OTHER): Payer: Medicare HMO | Admitting: Orthopedic Surgery

## 2022-12-25 DIAGNOSIS — M25561 Pain in right knee: Secondary | ICD-10-CM | POA: Diagnosis not present

## 2022-12-25 DIAGNOSIS — M25562 Pain in left knee: Secondary | ICD-10-CM

## 2022-12-25 DIAGNOSIS — M1711 Unilateral primary osteoarthritis, right knee: Secondary | ICD-10-CM | POA: Diagnosis not present

## 2022-12-26 ENCOUNTER — Encounter: Payer: Self-pay | Admitting: Orthopedic Surgery

## 2022-12-26 DIAGNOSIS — M1711 Unilateral primary osteoarthritis, right knee: Secondary | ICD-10-CM

## 2022-12-26 MED ORDER — LIDOCAINE HCL 1 % IJ SOLN
5.0000 mL | INTRAMUSCULAR | Status: AC | PRN
Start: 2022-12-25 — End: 2022-12-25
  Administered 2022-12-25: 5 mL

## 2022-12-26 MED ORDER — BUPIVACAINE HCL 0.25 % IJ SOLN
4.0000 mL | INTRAMUSCULAR | Status: AC | PRN
Start: 2022-12-25 — End: 2022-12-25
  Administered 2022-12-25: 4 mL via INTRA_ARTICULAR

## 2022-12-26 MED ORDER — METHYLPREDNISOLONE ACETATE 40 MG/ML IJ SUSP
40.0000 mg | INTRAMUSCULAR | Status: AC | PRN
Start: 2022-12-25 — End: 2022-12-25
  Administered 2022-12-25: 40 mg via INTRA_ARTICULAR

## 2022-12-26 NOTE — Progress Notes (Addendum)
Office Visit Note   Patient: [name]           Date of Birth: 1951-04-15           MRN: [id number] Visit Date: 12/25/2022 Requested by: Elayne Guerin, MD [address] [place],  [place] [address] PCP: Elayne Guerin, MD  Subjective: Chief Complaint  Patient presents with   Right Knee - Pain   Left Knee - Pain    HPI: [name] is a 72 y.o. female who presents to the office reporting bilateral knee pain.  She lives with her daughter who obtained a new puppy.  That dog ran into her knee and she lost balance and twisted her knees.  She did not fall.  Reports aching pain in both knees.  Not taking any medication for the problem yet.  Hurts her to get up.  She was doing well prior to this injury.  She has been taken off O2 for her COPD.  Had left total knee replacement in 2021..                ROS: All systems reviewed are negative as they relate to the chief complaint within the history of present illness.  Patient denies fevers or chills.  Assessment & Plan: Visit Diagnoses:  1. Pain in both knees, unspecified chronicity     Plan: Impression is bilateral knee pain with examination findings consistent with soft tissue injury and no structural damage.  Right knee is injected today.  Will see how she does with that.  Follow-up as needed.  Follow-Up Instructions: No follow-ups on file.   Orders:  Orders Placed This Encounter  Procedures   XR KNEE 3 VIEW RIGHT   XR KNEE 3 VIEW LEFT   No orders of the defined types were placed in this encounter.     Procedures: Large Joint Inj: R knee on 12/25/2022 7:05 AM Indications: diagnostic evaluation, joint swelling and pain Details: 18 G 1.5 in needle, superolateral approach  Arthrogram: No  Medications: 5 mL lidocaine 1 %; 40 mg methylPREDNISolone acetate 40 MG/ML; 4 mL bupivacaine 0.25 % Outcome: tolerated well, no immediate complications Procedure, treatment alternatives, risks and benefits explained, specific  risks discussed. Consent was given by the patient. Immediately prior to procedure a time out was called to verify the correct patient, procedure, equipment, support staff and site/side marked as required. Patient was prepped and draped in the usual sterile fashion.       Clinical Data: No additional findings.  Objective: Vital Signs: LMP  (LMP Unknown)   Physical Exam:  Constitutional: Patient appears well-developed HEENT:  Head: Normocephalic Eyes:EOM are normal Neck: Normal range of motion Cardiovascular: Normal rate Pulmonary/chest: Effort normal Neurologic: Patient is alert Skin: Skin is warm Psychiatric: Patient has normal mood and affect  Ortho Exam: Ortho exam demonstrates no effusion in either knee.  Extensor mechanism is intact in both knees.  Left knee has range of motion 0-1 10.  Right knee range of motion 0-1 15.  Collaterals are stable to varus and valgus stress at 0 30 degrees.  No groin pain with internal/external rotation of either leg.  Specialty Comments:  No specialty comments available.  Imaging: XR KNEE 3 VIEW RIGHT  Result Date: 12/26/2022 AP axillary merchant radiographs right knee reviewed.  No acute fracture.  Moderate tricompartmental osteoarthritis is present in all 3 compartments.  Alignment intact.  Some calcification of the popliteal vessel is present.  XR KNEE 3 VIEW LEFT  Result  Date: 12/26/2022 AP lateral merchant radiographs left knee reviewed.  Total knee prosthesis in good position alignment with no complicating features.  No fracture.  No lucency at the bone implant interface.    PMFS History: Patient Active Problem List   Diagnosis Date Noted   Symptomatic anemia 09/04/2022   Dyslipidemia 02/26/2022   Type 2 diabetes mellitus with peripheral neuropathy (HCC) 02/26/2022   COPD without exacerbation (HCC) 02/26/2022   Acute on chronic respiratory failure with hypoxia (HCC) 02/26/2022   Acute hypoxemic respiratory failure (HCC) 02/26/2022    Chronic diastolic CHF (congestive heart failure) (HCC) 02/25/2022   Streptococcal pneumonia (HCC)    Streptococcal bacteremia    CAP (community acquired pneumonia) 11/29/2021   Sepsis (HCC) 11/29/2021   Acute bacterial bronchitis 11/08/2021   GI bleed 11/08/2021   Elevated troponin level not due myocardial infarction 11/07/2021   Acute cardiogenic pulmonary edema (HCC) 11/07/2021   Chronic respiratory failure with hypoxia (HCC) 11/07/2021   Mixed diabetic hyperlipidemia associated with type 2 diabetes mellitus (HCC) 11/07/2021   Melena    Anemia due to chronic blood loss    AVM (arteriovenous malformation) of small bowel, acquired with hemorrhage    Acute on chronic blood loss anemia 09/21/2021   Thrombocytosis 09/21/2021   Type 2 diabetes mellitus without complication, without long-term current use of insulin (HCC) 09/21/2021   Obesity, Class III, BMI 40-49.9 (morbid obesity) (HCC) 08/27/2021   Acute upper gastrointestinal bleeding 05/09/2021   COPD (chronic obstructive pulmonary disease) (HCC)    Coronary artery disease    Osteoarthritis of left knee 04/18/2020   Tobacco use disorder 09/18/2018   Iron deficiency anemia 04/09/2018   Chronic anemia 12/26/2015   PAD (peripheral artery disease) (HCC) 10/05/2013   Discomfort in chest 05/11/2012   Nicotine dependence 05/11/2012   Bradycardia 05/11/2012   Essential hypertension 05/11/2012   Back pain 05/11/2012   Past Medical History:  Diagnosis Date   Anemia 12/26/2015   Anxiety    Arthritis    Blood transfusion without reported diagnosis    Chronic pain    resolved per pt 04/12/20   COPD (chronic obstructive pulmonary disease) (HCC)    Coronary artery disease    Depression    Diabetes mellitus without complication (HCC)    type 2   GERD (gastroesophageal reflux disease)    Heart murmur    since birth; Echo 02/18/19: LVEF 68%, grade 1 diastolic dysfunction, mild AS (mean grad 12 mmHg), trace MR/PR, mild TR, PASP 32 mmHg      Hyperlipemia    Hypertension    PVD (peripheral vascular disease) (HCC)    right SFA stent 02/11/17 by Dr. Jacinto Halim   Sleep apnea    does not use cpap   Wears glasses     Family History  Problem Relation Age of Onset   Diabetes Mother    Hypertension Mother    Heart disease Mother    Heart disease Father    Hypertension Father    Hypertension Sister    Hypertension Brother    Diabetes Brother    Hypertension Sister    Hypertension Brother    Diabetes Brother    Hypertension Brother    Hypertension Brother    Hypertension Brother     Past Surgical History:  Procedure Laterality Date   ABDOMINAL HYSTERECTOMY     CARDIAC CATHETERIZATION     CATARACT EXTRACTION     CESAREAN SECTION     COLONOSCOPY N/A 01/08/2013   Procedure: COLONOSCOPY;  Surgeon:  Theda Belfast, MD;  Location: [facility] ENDOSCOPY;  Service: Endoscopy;  Laterality: N/A;   COLONOSCOPY N/A 12/28/2015   Procedure: COLONOSCOPY;  Surgeon: Jeani Hawking, MD;  Location: [facility] ENDOSCOPY;  Service: Endoscopy;  Laterality: N/A;   COLONOSCOPY WITH PROPOFOL N/A 10/05/2020   Procedure: COLONOSCOPY WITH PROPOFOL;  Surgeon: Jeani Hawking, MD;  Location: [facility] ENDOSCOPY;  Service: Endoscopy;  Laterality: N/A;   ENTEROSCOPY N/A 12/28/2015   Procedure: ENTEROSCOPY;  Surgeon: Jeani Hawking, MD;  Location: [facility] ENDOSCOPY;  Service: Endoscopy;  Laterality: N/A;   ENTEROSCOPY N/A 02/20/2018   Procedure: ENTEROSCOPY;  Surgeon: Jeani Hawking, MD;  Location: [facility] ENDOSCOPY;  Service: Endoscopy;  Laterality: N/A;   ENTEROSCOPY N/A 03/27/2018   Procedure: ENTEROSCOPY;  Surgeon: Jeani Hawking, MD;  Location: [facility] ENDOSCOPY;  Service: Endoscopy;  Laterality: N/A;   ENTEROSCOPY N/A 10/05/2020   Procedure: ENTEROSCOPY;  Surgeon: Jeani Hawking, MD;  Location: [facility] ENDOSCOPY;  Service: Endoscopy;  Laterality: N/A;   ENTEROSCOPY N/A 05/11/2021   Procedure: ENTEROSCOPY;  Surgeon: Jeani Hawking, MD;  Location: [facility] ENDOSCOPY;  Service: Endoscopy;  Laterality: N/A;    ENTEROSCOPY N/A 09/23/2021   Procedure: ENTEROSCOPY;  Surgeon: Sherrilyn Rist, MD;  Location: [facility] ENDOSCOPY;  Service: Gastroenterology;  Laterality: N/A;   ESOPHAGOGASTRODUODENOSCOPY N/A 01/08/2013   Procedure: ESOPHAGOGASTRODUODENOSCOPY (EGD);  Surgeon: Theda Belfast, MD;  Location: [facility] ENDOSCOPY;  Service: Endoscopy;  Laterality: N/A;   EYE SURGERY     GIVENS CAPSULE STUDY N/A 09/24/2021   Procedure: GIVENS CAPSULE STUDY;  Surgeon: Jeani Hawking, MD;  Location: [facility] ENDOSCOPY;  Service: Gastroenterology;  Laterality: N/A;   HAND SURGERY     HEMOSTASIS CLIP PLACEMENT  05/11/2021   Procedure: HEMOSTASIS CLIP PLACEMENT;  Surgeon: Jeani Hawking, MD;  Location: [facility] ENDOSCOPY;  Service: Endoscopy;;   HOT HEMOSTASIS N/A 12/28/2015   Procedure: HOT HEMOSTASIS (ARGON PLASMA COAGULATION/BICAP);  Surgeon: Jeani Hawking, MD;  Location: [facility] ENDOSCOPY;  Service: Endoscopy;  Laterality: N/A;   HOT HEMOSTASIS N/A 02/20/2018   Procedure: HOT HEMOSTASIS (ARGON PLASMA COAGULATION/BICAP);  Surgeon: Jeani Hawking, MD;  Location: [facility] ENDOSCOPY;  Service: Endoscopy;  Laterality: N/A;   HOT HEMOSTASIS N/A 03/27/2018   Procedure: HOT HEMOSTASIS (ARGON PLASMA COAGULATION/BICAP);  Surgeon: Jeani Hawking, MD;  Location: [facility] ENDOSCOPY;  Service: Endoscopy;  Laterality: N/A;   HOT HEMOSTASIS N/A 10/05/2020   Procedure: HOT HEMOSTASIS (ARGON PLASMA COAGULATION/BICAP);  Surgeon: Jeani Hawking, MD;  Location: [facility] ENDOSCOPY;  Service: Endoscopy;  Laterality: N/A;   HOT HEMOSTASIS N/A 05/11/2021   Procedure: HOT HEMOSTASIS (ARGON PLASMA COAGULATION/BICAP);  Surgeon: Jeani Hawking, MD;  Location: [facility] ENDOSCOPY;  Service: Endoscopy;  Laterality: N/A;   LEFT HEART CATHETERIZATION WITH CORONARY ANGIOGRAM N/A 03/09/2013   Procedure: LEFT HEART CATHETERIZATION WITH CORONARY ANGIOGRAM;  Surgeon: Pamella Pert, MD;  Location: [facility] CATH LAB;  Service: Cardiovascular;  Laterality: N/A;   LOWER EXTREMITY ANGIOGRAM N/A 12/29/2012   Procedure: LOWER  EXTREMITY ANGIOGRAM;  Surgeon: Pamella Pert, MD;  Location: [facility] CATH LAB;  Service: Cardiovascular;  Laterality: N/A;   LOWER EXTREMITY ANGIOGRAM N/A 10/05/2013   Procedure: LOWER EXTREMITY ANGIOGRAM;  Surgeon: Pamella Pert, MD;  Location: [facility] CATH LAB;  Service: Cardiovascular;  Laterality: N/A;   LOWER EXTREMITY ANGIOGRAPHY N/A 02/11/2017   Procedure: Lower Extremity Angiography;  Surgeon: Yates Decamp, MD;  Location: [facility] INVASIVE CV LAB;  Service: Cardiovascular;  Laterality: N/A;   PERIPHERAL VASCULAR BALLOON ANGIOPLASTY  02/11/2017   Procedure: PERIPHERAL VASCULAR BALLOON ANGIOPLASTY;  Surgeon: Yates Decamp, MD;  Location: [facility] INVASIVE  CV LAB;  Service: Cardiovascular;;  Right SFA   PERIPHERAL VASCULAR INTERVENTION Right 02/11/2017   Procedure: PERIPHERAL VASCULAR INTERVENTION;  Surgeon: Yates Decamp, MD;  Location: [facility] INVASIVE CV LAB;  Service: Cardiovascular;  Laterality: Right;  Rt SFA   POLYPECTOMY  10/05/2020   Procedure: POLYPECTOMY;  Surgeon: Jeani Hawking, MD;  Location: [facility] ENDOSCOPY;  Service: Endoscopy;;   stent in right leg  12/29/2012   for blood clot, Dr. Nadara Eaton   TOTAL KNEE ARTHROPLASTY Left 04/18/2020   TOTAL KNEE ARTHROPLASTY Left 04/18/2020   Procedure: LEFT TOTAL KNEE ARTHROPLASTY;  Surgeon: Cammy Copa, MD;  Location: [facility] OR;  Service: Orthopedics;  Laterality: Left;   TYMPANOMASTOIDECTOMY Left 03/08/2014   Procedure: TYMPANOMASTOIDECTOMY LEFT;  Surgeon: Darletta Moll, MD;  Location: [facility];  Service: ENT;  Laterality: Left;   TYMPANOMASTOIDECTOMY  2015   UTERINE FIBROID SURGERY     Social History   Occupational History   Not on file  Tobacco Use   Smoking status: Former    Packs/day: 0.60    Years: 54.00    Additional pack years: 0.00    Total pack years: 32.40    Types: Cigarettes    Start date: 08/18/1966    Quit date: 05/25/2021    Years since quitting: 1.5   Smokeless tobacco: Never  Vaping Use   Vaping Use: Never used  Substance and  Sexual Activity   Alcohol use: Yes    Comment: occasional   Drug use: Not Currently    Types: Marijuana, Cocaine    Comment: Last use 04/11/20   Sexual activity: Not Currently    Birth control/protection: Surgical    Comment: Hysterectomy

## 2023-01-29 ENCOUNTER — Encounter (HOSPITAL_COMMUNITY): Payer: Self-pay

## 2023-01-29 ENCOUNTER — Other Ambulatory Visit: Payer: Self-pay

## 2023-01-29 ENCOUNTER — Inpatient Hospital Stay (HOSPITAL_COMMUNITY)
Admission: EM | Admit: 2023-01-29 | Discharge: 2023-01-31 | DRG: 291 | Disposition: A | Discharge status: Home / Self Care | Payer: Medicare HMO | Attending: Family Medicine | Admitting: Family Medicine

## 2023-01-29 ENCOUNTER — Emergency Department (HOSPITAL_COMMUNITY): Payer: Medicare HMO

## 2023-01-29 DIAGNOSIS — Z91199 Patient's noncompliance with other medical treatment and regimen due to unspecified reason: Secondary | ICD-10-CM

## 2023-01-29 DIAGNOSIS — Z79899 Other long term (current) drug therapy: Secondary | ICD-10-CM

## 2023-01-29 DIAGNOSIS — K219 Gastro-esophageal reflux disease without esophagitis: Secondary | ICD-10-CM | POA: Diagnosis present

## 2023-01-29 DIAGNOSIS — R0902 Hypoxemia: Secondary | ICD-10-CM

## 2023-01-29 DIAGNOSIS — Z96652 Presence of left artificial knee joint: Secondary | ICD-10-CM | POA: Diagnosis present

## 2023-01-29 DIAGNOSIS — Z7951 Long term (current) use of inhaled steroids: Secondary | ICD-10-CM

## 2023-01-29 DIAGNOSIS — J449 Chronic obstructive pulmonary disease, unspecified: Secondary | ICD-10-CM | POA: Diagnosis present

## 2023-01-29 DIAGNOSIS — I509 Heart failure, unspecified: Secondary | ICD-10-CM

## 2023-01-29 DIAGNOSIS — I10 Essential (primary) hypertension: Secondary | ICD-10-CM | POA: Diagnosis present

## 2023-01-29 DIAGNOSIS — Z91148 Patient's other noncompliance with medication regimen for other reason: Secondary | ICD-10-CM

## 2023-01-29 DIAGNOSIS — E1142 Type 2 diabetes mellitus with diabetic polyneuropathy: Secondary | ICD-10-CM | POA: Diagnosis present

## 2023-01-29 DIAGNOSIS — Z7902 Long term (current) use of antithrombotics/antiplatelets: Secondary | ICD-10-CM

## 2023-01-29 DIAGNOSIS — R0602 Shortness of breath: Secondary | ICD-10-CM | POA: Diagnosis not present

## 2023-01-29 DIAGNOSIS — Z87891 Personal history of nicotine dependence: Secondary | ICD-10-CM

## 2023-01-29 DIAGNOSIS — R600 Localized edema: Secondary | ICD-10-CM

## 2023-01-29 DIAGNOSIS — I2510 Atherosclerotic heart disease of native coronary artery without angina pectoris: Secondary | ICD-10-CM | POA: Diagnosis present

## 2023-01-29 DIAGNOSIS — I5023 Acute on chronic systolic (congestive) heart failure: Secondary | ICD-10-CM | POA: Diagnosis present

## 2023-01-29 DIAGNOSIS — I110 Hypertensive heart disease with heart failure: Secondary | ICD-10-CM | POA: Diagnosis not present

## 2023-01-29 DIAGNOSIS — E1151 Type 2 diabetes mellitus with diabetic peripheral angiopathy without gangrene: Secondary | ICD-10-CM | POA: Diagnosis present

## 2023-01-29 DIAGNOSIS — E785 Hyperlipidemia, unspecified: Secondary | ICD-10-CM | POA: Diagnosis present

## 2023-01-29 DIAGNOSIS — Z7982 Long term (current) use of aspirin: Secondary | ICD-10-CM

## 2023-01-29 DIAGNOSIS — E876 Hypokalemia: Secondary | ICD-10-CM | POA: Diagnosis not present

## 2023-01-29 DIAGNOSIS — G8929 Other chronic pain: Secondary | ICD-10-CM | POA: Diagnosis present

## 2023-01-29 DIAGNOSIS — R001 Bradycardia, unspecified: Secondary | ICD-10-CM | POA: Diagnosis present

## 2023-01-29 DIAGNOSIS — Z95820 Peripheral vascular angioplasty status with implants and grafts: Secondary | ICD-10-CM

## 2023-01-29 LAB — BASIC METABOLIC PANEL
Anion gap: 11 (ref 5–15)
BUN: UNDETERMINED mg/dL (ref 8–23)
BUN: 7 mg/dL — ABNORMAL LOW (ref 8–23)
CO2: UNDETERMINED mmol/L (ref 22–32)
CO2: 26 mmol/L (ref 22–32)
Calcium: UNDETERMINED mg/dL (ref 8.9–10.3)
Calcium: 8.7 mg/dL — ABNORMAL LOW (ref 8.9–10.3)
Chloride: UNDETERMINED mmol/L (ref 98–111)
Chloride: 99 mmol/L (ref 98–111)
Creatinine, Ser: 0.82 mg/dL (ref 0.44–1.00)
Creatinine, Ser: 0.87 mg/dL (ref 0.44–1.00)
GFR, Estimated: >60 mL/min (ref >60)
GFR, Estimated: >60 mL/min (ref >60)
Glucose, Bld: 145 mg/dL — ABNORMAL HIGH (ref 70–99)
Glucose, Bld: 150 mg/dL — ABNORMAL HIGH (ref 70–99)
Potassium: UNDETERMINED mmol/L (ref 3.5–5.1)
Potassium: 4.3 mmol/L (ref 3.5–5.1)
Sodium: UNDETERMINED mmol/L (ref 135–145)
Sodium: 136 mmol/L (ref 135–145)

## 2023-01-29 LAB — CBC
HCT: 42.4 % (ref 36.0–46.0)
Hemoglobin: 13.4 g/dL (ref 12.0–15.0)
MCH: 28.1 pg (ref 26.0–34.0)
MCHC: 31.6 g/dL (ref 30.0–36.0)
MCV: 88.9 fL (ref 80.0–100.0)
Platelets: 344 10*3/uL (ref 150–400)
RBC: 4.77 MIL/uL (ref 3.87–5.11)
RDW: 15.8 % — ABNORMAL HIGH (ref 11.5–15.5)
WBC: 6 10*3/uL (ref 4.0–10.5)
nRBC: 0 % (ref 0.0–0.2)

## 2023-01-29 LAB — BRAIN NATRIURETIC PEPTIDE: B Natriuretic Peptide: 136.2 pg/mL — ABNORMAL HIGH (ref 0.0–100.0)

## 2023-01-29 MED ORDER — METOPROLOL SUCCINATE ER 25 MG PO TB24
25.0000 mg | ORAL_TABLET | Freq: Every day | ORAL | Status: DC
  Administered 2023-01-30 – 2023-01-31 (×2): 25 mg via ORAL
  Filled 2023-01-29 (×2): qty 1

## 2023-01-29 MED ORDER — PANTOPRAZOLE SODIUM 40 MG PO TBEC
40.0000 mg | DELAYED_RELEASE_TABLET | Freq: Two times a day (BID) | ORAL | Status: DC
  Administered 2023-01-29 – 2023-01-31 (×4): 40 mg via ORAL
  Filled 2023-01-29 (×4): qty 1

## 2023-01-29 MED ORDER — FLUTICASONE FUROATE-VILANTEROL 100-25 MCG/ACT IN AEPB
1.0000 | INHALATION_SPRAY | Freq: Every day | RESPIRATORY_TRACT | Status: DC
  Administered 2023-01-30 – 2023-01-31 (×2): 1 via RESPIRATORY_TRACT
  Filled 2023-01-29 (×2): qty 28

## 2023-01-29 MED ORDER — ACETAMINOPHEN 500 MG PO TABS
1000.0000 mg | ORAL_TABLET | Freq: Four times a day (QID) | ORAL | Status: DC | PRN
  Filled 2023-01-29: qty 2

## 2023-01-29 MED ORDER — ATORVASTATIN CALCIUM 80 MG PO TABS
80.0000 mg | ORAL_TABLET | Freq: Every day | ORAL | Status: DC
  Administered 2023-01-30 – 2023-01-31 (×2): 80 mg via ORAL
  Filled 2023-01-29: qty 2
  Filled 2023-01-29: qty 1

## 2023-01-29 MED ORDER — ASPIRIN 81 MG PO TBEC
81.0000 mg | DELAYED_RELEASE_TABLET | Freq: Every day | ORAL | Status: DC
  Administered 2023-01-29 – 2023-01-31 (×3): 81 mg via ORAL
  Filled 2023-01-29 (×3): qty 1

## 2023-01-29 MED ORDER — ENOXAPARIN SODIUM 60 MG/0.6ML IJ SOSY
55.0000 mg | PREFILLED_SYRINGE | INTRAMUSCULAR | Status: DC
  Administered 2023-01-30: 55 mg via SUBCUTANEOUS
  Filled 2023-01-29: qty 0.6

## 2023-01-29 MED ORDER — NITROGLYCERIN 0.4 MG SL SUBL: 0.4000 mg | SUBLINGUAL_TABLET | SUBLINGUAL | Status: DC | PRN

## 2023-01-29 MED ORDER — FERROUS SULFATE 325 (65 FE) MG PO TABS
325.0000 mg | ORAL_TABLET | Freq: Every day | ORAL | Status: DC
  Administered 2023-01-30 – 2023-01-31 (×2): 325 mg via ORAL
  Filled 2023-01-29 (×2): qty 1

## 2023-01-29 MED ORDER — UMECLIDINIUM BROMIDE 62.5 MCG/ACT IN AEPB
1.0000 | INHALATION_SPRAY | Freq: Every day | RESPIRATORY_TRACT | Status: DC
  Administered 2023-01-30 – 2023-01-31 (×2): 1 via RESPIRATORY_TRACT
  Filled 2023-01-29 (×2): qty 7

## 2023-01-29 MED ORDER — SACUBITRIL-VALSARTAN 24-26 MG PO TABS
1.0000 | ORAL_TABLET | Freq: Once | ORAL | Status: AC
  Administered 2023-01-29: 1 via ORAL
  Filled 2023-01-29: qty 1

## 2023-01-29 MED ORDER — IPRATROPIUM-ALBUTEROL 0.5-2.5 (3) MG/3ML IN SOLN: 3.0000 mL | RESPIRATORY_TRACT | Status: DC | PRN

## 2023-01-29 MED ORDER — EZETIMIBE 10 MG PO TABS
10.0000 mg | ORAL_TABLET | Freq: Every day | ORAL | Status: DC
  Administered 2023-01-30 – 2023-01-31 (×2): 10 mg via ORAL
  Filled 2023-01-29 (×2): qty 1

## 2023-01-29 MED ORDER — OCTREOTIDE ACETATE 100 MCG/ML IJ SOLN
100.0000 ug | Freq: Three times a day (TID) | INTRAMUSCULAR | Status: DC
  Administered 2023-01-29 – 2023-01-31 (×4): 100 ug via SUBCUTANEOUS
  Filled 2023-01-29 (×7): qty 1

## 2023-01-29 MED ORDER — SACUBITRIL-VALSARTAN 24-26 MG PO TABS: 1.0000 | ORAL_TABLET | Freq: Two times a day (BID) | ORAL | Status: DC

## 2023-01-29 MED ORDER — FUROSEMIDE 10 MG/ML IJ SOLN
60.0000 mg | Freq: Once | INTRAMUSCULAR | Status: AC
  Administered 2023-01-29: 60 mg via INTRAVENOUS
  Filled 2023-01-29: qty 6

## 2023-01-29 NOTE — Assessment & Plan Note (Signed)
Mild bradycardia in the 50s.  This seems to be a chronic issue for patient and could be attributed to her beta-blocker.  Currently asymptomatic. -Continuous cardiac monitoring.

## 2023-01-29 NOTE — ED Triage Notes (Signed)
Pt arrives via POV. Pt reports sob and swelling to bilateral legs. Pt states she hasn't had her lasix in about 3 weeks. Pt AxOx4. Pt denies cp. Pitting edema noted in triage.

## 2023-01-29 NOTE — ED Provider Notes (Cosign Needed Addendum)
[place] EMERGENCY DEPARTMENT AT [facility] Provider Note   CSN: [id number] Arrival date & time: 01/29/23  1349     History  Chief Complaint  Patient presents with   Shortness of Breath   Leg Swelling    [name] is a 72 y.o. female with past medical history COPD, CAD, HTN, HLD, HF, chronic pain who presents to the ED complaining of dyspnea on exertion, bilateral lower extremity edema for the last week.  States that she has been out of her Lasix for the last 3 to 4 weeks that she forgot to refill it.  Has also been profoundly hypertensive at home despite taking her medications. Denies chest pain, nausea, vomiting, cough, congestion, fever, abdominal pain, or any other acute complaints.  Has follow-up scheduled for 8/30.      Home Medications Prior to Admission medications   Medication Sig Start Date End Date Taking? Authorizing Provider  acetaminophen (TYLENOL) 500 MG tablet Take 1,000 mg by mouth as needed for moderate pain.    [provider]  albuterol (PROVENTIL) (2.5 MG/3ML) 0.083% nebulizer solution Take 2.5 mg by nebulization in the morning, at noon, and at bedtime.    [provider]  albuterol (VENTOLIN HFA) 108 (90 Base) MCG/ACT inhaler Inhale 1-2 puffs into the lungs every 6 (six) hours as needed for wheezing or shortness of breath. 11/13/21   Burnadette Pop, MD  aspirin EC (ASPIRIN LOW DOSE) 81 MG tablet Take 1 tablet (81 mg total) by mouth daily. Resume after 1 week, on 09/13/2022 as long as you are not having black stools. 09/13/22   Osvaldo Shipper, MD  atorvastatin (LIPITOR) 80 MG tablet Take 80 mg by mouth daily. 08/08/20   [provider]  cefdinir (OMNICEF) 300 MG capsule Take 300 mg by mouth 2 (two) times daily. 09/03/22   [provider]  Cholecalciferol (VITAMIN D-3 PO) Take 1 capsule by mouth 2 (two) times daily.    [provider]  cilostazol (PLETAL) 100 MG tablet Take 1 tablet (100 mg total) by  mouth 2 (two) times daily. Resume after 1 week, on 09/13/2022 as long as you are not having black stools. 09/13/22   Osvaldo Shipper, MD  ezetimibe (ZETIA) 10 MG tablet TAKE 1 TABLET BY MOUTH DAILY Patient taking differently: Take 10 mg by mouth daily. 10/15/21   [name], [name], DO  ferrous sulfate 325 (65 FE) MG tablet Take 325 mg by mouth daily.    [provider]  fluticasone (FLONASE) 50 MCG/ACT nasal spray Place 1 spray into both nostrils daily as needed for allergies. 08/14/21   [provider]  Fluticasone-Umeclidin-Vilant (TRELEGY ELLIPTA) 100-62.5-25 MCG/ACT AEPB Inhale 1 puff into the lungs daily. Patient taking differently: Inhale 2 puffs into the lungs daily. 02/13/22   Icard, Rachel Bo, DO  furosemide (LASIX) 40 MG tablet Take 1 tablet (40 mg total) by mouth 2 (two) times daily. 03/01/22 09/12/22  Burnadette Pop, MD  gabapentin (NEURONTIN) 600 MG tablet Take 1 tablet (600 mg total) by mouth at bedtime. Patient taking differently: Take 600 mg by mouth 2 (two) times daily. 10/02/17   Hyatt, Max T, DPM  ipratropium-albuterol (DUONEB) 0.5-2.5 (3) MG/3ML SOLN Take 3 mLs by nebulization 3 (three) times daily. Patient taking differently: Take 3 mLs by nebulization as needed (wheezing). 11/13/21 02/27/23  Burnadette Pop, MD  LINZESS 145 MCG CAPS capsule Take 145 mcg by mouth daily as needed (Constipation). 02/11/22   [provider]  methocarbamol (ROBAXIN) 500 MG  tablet Take 500 mg by mouth daily.    [provider]  metoprolol succinate (TOPROL-XL) 25 MG 24 hr tablet TAKE 1 TABLET(25 MG) BY MOUTH DAILY 11/29/22   [name], [name], DO  nitroGLYCERIN (NITROSTAT) 0.4 MG SL tablet Place 1 tablet (0.4 mg total) under the tongue every 5 (five) minutes as needed for chest pain. 03/21/22 09/12/22  [name], [name], DO  octreotide (SANDOSTATIN) 100 MCG/ML SOLN injection Inject 1 mL (100 mcg total) into the skin 3 (three) times daily. 09/06/22   Osvaldo Shipper, MD  OXYGEN Inhale 3 L into the  lungs continuous.    [provider]  pantoprazole (PROTONIX) 40 MG tablet Take 1 tablet (40 mg total) by mouth 2 (two) times daily. Patient taking differently: Take 40 mg by mouth daily. 09/25/21   Leroy Sea, MD  Potassium Chloride ER 20 MEQ TBCR Take 20 mEq by mouth daily. 08/21/22   [provider]  sacubitril-valsartan (ENTRESTO) 24-26 MG Take 1 tablet by mouth 2 (two) times daily. 03/25/22   [name], [name], DO  SYRINGE-NEEDLE, DISP, 3 ML 25G X 5/8" 3 ML MISC Inject 1 Syringe into the skin 3 (three) times daily. For use with Octreodide injection. 09/06/22   Osvaldo Shipper, MD      Allergies    Patient has no known allergies.    Review of Systems   Review of Systems  All other systems reviewed and are negative.   Physical Exam Updated Vital Signs BP (!) 159/46 (BP Location: Right Arm)   Pulse (!) 56   Temp 98 F (36.7 C) (Oral)   Resp 15   Ht 4\' 11"  (1.499 m)   Wt 109.8 kg   LMP  (LMP Unknown)   SpO2 (!) 86%   BMI 48.88 kg/m  Physical Exam Vitals and nursing note reviewed.  Constitutional:      General: She is not in acute distress.    Appearance: Normal appearance. She is not ill-appearing or toxic-appearing.  HENT:     Head: Normocephalic and atraumatic.     Mouth/Throat:     Mouth: Mucous membranes are moist.  Eyes:     Conjunctiva/sclera: Conjunctivae normal.  Neck:     Vascular: No JVD.  Cardiovascular:     Rate and Rhythm: Normal rate and regular rhythm.     Heart sounds: No murmur heard. Pulmonary:     Effort: Tachypnea present. No bradypnea.     Breath sounds: No stridor. Decreased breath sounds (throughout secondary to body habitus) present. No wheezing, rhonchi or rales.  Chest:     Chest wall: No mass, deformity, tenderness, crepitus or edema.  Abdominal:     General: Abdomen is flat.     Palpations: Abdomen is soft.     Tenderness: There is no abdominal tenderness. There is no guarding or rebound.  Musculoskeletal:         General: Normal range of motion.     Cervical back: Normal range of motion and neck supple.     Right lower leg: No tenderness. Edema (2+ pitting, symmetric) present.     Left lower leg: No tenderness. Edema (2+ pitting, symmetric) present.  Skin:    General: Skin is warm and dry.     Capillary Refill: Capillary refill takes less than 2 seconds.  Neurological:     General: No focal deficit present.     Mental Status: She is alert and oriented to person, place, and time. Mental status is at baseline.  Psychiatric:  Mood and Affect: Mood normal.        Behavior: Behavior normal.     ED Results / Procedures / Treatments   Labs (all labs ordered are listed, but only abnormal results are displayed) Labs Reviewed  CBC - Abnormal; Notable for the following components:      Result Value   RDW 15.8 (*)    All other components within normal limits  BRAIN NATRIURETIC PEPTIDE - Abnormal; Notable for the following components:   B Natriuretic Peptide 136.2 (*)    All other components within normal limits  BASIC METABOLIC PANEL - Abnormal; Notable for the following components:   Glucose, Bld 150 (*)    BUN 7 (*)    Calcium 8.7 (*)    All other components within normal limits  BASIC METABOLIC PANEL - Abnormal; Notable for the following components:   Glucose, Bld 145 (*)    All other components within normal limits    EKG EKG Interpretation Date/Time:  Wednesday January 29 2023 13:55:20 EDT Ventricular Rate:  52 PR Interval:  164 QRS Duration:  82 QT Interval:  456 QTC Calculation: 424 R Axis:   99  Text Interpretation: Sinus bradycardia Rightward axis Cannot rule out Anterior infarct , age undetermined Abnormal ECG When compared with ECG of 25-Feb-2022 19:58, PREVIOUS ECG IS PRESENT Confirmed by Lorre Nick ([id number]) on 01/29/2023 8:01:43 PM  Radiology DG Chest 2 View  Result Date: 01/29/2023 CLINICAL DATA:  Shortness of breath with weight gain over the last couple days. EXAM:  CHEST - 2 VIEW COMPARISON:  Radiographs 09/04/2022 and 02/25/2022.  CT 07/17/2022. FINDINGS: Stable cardiomegaly, aortic atherosclerosis and chronic vascular congestion. Possible minimally increased interstitial edema compared with the prior study. No confluent airspace disease, pneumothorax or significant pleural effusion. Stable mild degenerative changes in the spine. IMPRESSION: Cardiomegaly with chronic vascular congestion and possible minimally increased interstitial edema. Electronically Signed   By: Carey Bullocks M.D.   On: 01/29/2023 15:26    Procedures Procedures    Medications Ordered in ED Medications  sacubitril-valsartan (ENTRESTO) 24-26 mg per tablet (1 tablet Oral Given 01/29/23 1940)  furosemide (LASIX) injection 60 mg (60 mg Intravenous Given 01/29/23 1957)    ED Course/ Medical Decision Making/ A&P                                 Medical Decision Making Amount and/or Complexity of Data Reviewed Labs: ordered. Decision-making details documented in ED Course. Radiology: ordered. Decision-making details documented in ED Course. ECG/medicine tests: ordered. Decision-making details documented in ED Course.  Risk Prescription drug management. Decision regarding hospitalization.   Medical Decision Making:   [name] is a 72 y.o. female who presented to the ED today with dyspnea detailed above.    Patient's presentation is complicated by their history of HF, COPD, HTN.  Patient placed on continuous vitals and telemetry monitoring while in ED which was reviewed periodically.  Complete initial physical exam performed, notably the patient was tachypneic with diminished breath sounds. 2+ pitting LE edema.  Reviewed and confirmed nursing documentation for past medical history, family history, social history.    Initial Assessment:   With the patient's presentation, differential diagnosis includes but is not limited to  Cardiac (AHF, pericardial effusion and  tamponade, arrhythmias, ischemia, etc) Respiratory (COPD, asthma, pneumonia, pneumothorax, primary pulmonary hypertension, PE/VQ mismatch) Hematological (anemia) Neuromuscular (ALS, Guillain-Barr, etc)  This is most consistent with an acute  complicated illness  Initial Plan:  Screening labs including CBC and Metabolic panel to evaluate for infectious or metabolic etiology of disease.  CXR to evaluate for structural/infectious intrathoracic pathology.  EKG to evaluate for cardiac pathology BNP to evaluate for fluid overload Symptomatic treatment Objective evaluation as below reviewed   Initial Study Results:   Laboratory  All laboratory results reviewed without evidence of clinically relevant pathology.   Exceptions include: BNP 136  EKG EKG was reviewed independently. Sinus brady, no acute ST-T changes. No STEMI.   Radiology:  All images reviewed independently. Agree with radiology report at this time.   DG Chest 2 View  Result Date: 01/29/2023 CLINICAL DATA:  Shortness of breath with weight gain over the last couple days. EXAM: CHEST - 2 VIEW COMPARISON:  Radiographs 09/04/2022 and 02/25/2022.  CT 07/17/2022. FINDINGS: Stable cardiomegaly, aortic atherosclerosis and chronic vascular congestion. Possible minimally increased interstitial edema compared with the prior study. No confluent airspace disease, pneumothorax or significant pleural effusion. Stable mild degenerative changes in the spine. IMPRESSION: Cardiomegaly with chronic vascular congestion and possible minimally increased interstitial edema. Electronically Signed   By: Carey Bullocks M.D.   On: 01/29/2023 15:26      Consults: Case discussed with family medicine team who will admit.   Final Assessment and Plan:   72 year old female presents to the ED with dyspnea and lower extremity edema for a week.  History of heart failure.  Last EF 55 to 60%.  Patient is dyspneic with speaking in short phrases and sentences.   Diminished breath sounds throughout without any wheezing identifying, low suspicion for COPD exacerbation.  Recommendation as above for further assessment.  BNP minimally elevated though could be masked secondary to patient's morbid obesity.  Chest x-ray with interstitial edema.  Patient has 2+ pitting edema bilaterally.  No risk factors for PE identified.  Wells criteria negative.  Patient is desatting down to the 80s.  No previous oxygen requirement.  With this, we will admit for further treatment.  Dose of IV Lasix 60 mg ordered.  Discussed with family medicine team who will admit.   Clinical Impression:  1. Acute on chronic congestive heart failure, unspecified heart failure type (HCC)   2. Peripheral edema   3. Hypoxia      Admit            Final Clinical Impression(s) / ED Diagnoses Final diagnoses:  Acute on chronic congestive heart failure, unspecified heart failure type [facility])  Peripheral edema  Hypoxia    Rx / DC Orders ED Discharge Orders     None         Tonette Lederer, PA-C 01/29/23 2015    , Lawrence Marseilles, PA-C 01/29/23 2344    Lorre Nick, MD 01/30/23 2250

## 2023-01-29 NOTE — Assessment & Plan Note (Signed)
Suspect patient's dyspnea is more likely in the setting of HF exacerbation versus COPD.  No wheezing on exam, and denies any productive cough.  Right leg have good work of breathing on RA with reassuring O2 sats.  Home  medication included as needed albuterol, DuoNeb and daily controller with Trelegy. -As needed albuterol -As needed DuoNeb -Daily Incruse  + Breo

## 2023-01-29 NOTE — Assessment & Plan Note (Signed)
Well-controlled with A1c of 5.0 from 4 months ago.  Blood glucose since admission have been appropriate.  She is currently not on any diabetic medication. -Heart healthy and carb modified diet

## 2023-01-29 NOTE — Assessment & Plan Note (Addendum)
BP elevated on admission.  Has since improved.  Patient currently asymptomatic.  Home medication includes metoprolol 25 mg daily, furosemide, and spironolactone 50 mg daily. -Monitor BP with routine vitals -Continue metoprolol -Holding spironolactone.

## 2023-01-29 NOTE — Assessment & Plan Note (Addendum)
Patient's constellation of symptoms including orthopnea, dyspnea with exertion, bilateral lower extremity edema and checks x-ray finding of cardiomegaly with vascular congestion is strongly suggestive of CHF exacerbation in the setting of noncompliance to home Lasix .  Unclear of patient's dry weight however she has reported over 30 pound weight gain in the last month.  She is s/p 60 mg IV Lasix with good urine output.  Will continue diuresis with close monitoring of kidney function and electrolytes. -Admit to med telemetry, Dr. Jennette Kettle attending -S/p IV Lasix 60 mg -Daily weight -Strict ins and outs -Daily BMP -Holding home [place]

## 2023-01-29 NOTE — H&P (Cosign Needed Addendum)
Hospital Admission History and Physical Service Pager: [phone]  Patient name: [name] Medical record number: [id number] Date of Birth: December 30, 1950 Age: 72 y.o. Gender: female  Primary Care Provider: Elayne Guerin, MD Consultants: None Code Status: Full code Preferred Emergency Contact:  :Gwendalyn Ege (daughter) [phone] Christiana Pellant (daughter): [phone]    Chief Complaint: dyspnea and lower extremity edema  Assessment and Plan: [name] is a 72 y.o. female presenting with 1 week of worsening dyspnea and anasarca. Differential for presentation of this includes CHF exacerbation  vs COPD exacerbation.  Given reports of noncompliance to home Lasix and reports of orthopnea, dyspnea and bilateral lower extremity edema suspect patient's symptoms are more consistent with CHF exacerbation over COPD in the absence of worsening cough, increased sputum production or wheezing on exam.  Other possible differentials considered although less likely include anemia, nephrotic syndrome, or cirrhosis.   [facility]     * (Principal) CHF exacerbation [facility])     Patient's constellation of symptoms including orthopnea, dyspnea with  exertion, bilateral lower extremity edema and checks x-ray finding of  cardiomegaly with vascular congestion is strongly suggestive of CHF  exacerbation in the setting of noncompliance to home Lasix .  Unclear of  patient's dry weight however she has reported over 30 pound weight gain in  the last month.  She is s/p 60 mg IV Lasix with good urine output.  Will  continue diuresis with close monitoring of kidney function and  electrolytes. -Admit to med telemetry, Dr. Jennette Kettle attending -S/p IV Lasix 60 mg -Daily weight -Strict ins and outs -Daily BMP -Holding home Entresto        Bradycardia     Mild bradycardia in the 50s.  This seems to be a chronic issue for  patient and could be attributed to her beta-blocker.   Currently  asymptomatic. -Continuous cardiac monitoring.        Essential hypertension     BP elevated on admission.  Has since improved.  Patient currently  asymptomatic.  Home medication includes metoprolol 25 mg daily,  furosemide, and spironolactone 50 mg daily. -Monitor BP with routine vitals -Continue metoprolol -Holding spironolactone.         COPD (chronic obstructive pulmonary disease) (HCC)     Suspect patient's dyspnea is more likely in the setting of HF  exacerbation versus COPD.  No wheezing on exam, and denies any productive  cough.  Right leg have good work of breathing on RA with reassuring O2  sats.  Home  medication included as needed albuterol, DuoNeb and daily  controller with Trelegy. -As needed albuterol -As needed DuoNeb -Daily Incruse  + Breo        Type 2 diabetes mellitus with peripheral neuropathy (HCC)     Well-controlled with A1c of 5.0 from 4 months ago.  Blood glucose since  admission have been appropriate.  She is currently not on any diabetic  medication. -Heart healthy and carb modified diet      FEN/GI: Carb modified/Heart Healthy VTE Prophylaxis: Lovenox  Disposition: admit to FMTS  History of Present Illness:  [name] is a 72 y.o. female presenting with 1 week of dyspnea and peripheral edema in the setting of not taking Lasix 40mg  BID for 3-4 weeks. She has also had elevated blood pressures at home despite taking antihypertensive medications (entresto and metoprolol).   Patient report she was told by her daughter who manages her medication that  she has been out of lasix for a while. She has been noticing increasinly swelling of the legs las week. She has also noticed worseing SOB around the same time. Blood pressure has been high at home as well in the 170s/100s. Report compliance with the rest of her home medication. She is usually unable to lay flat at basline and sleeps with hostpital bed in incline.  Patient report her  weight was 202 previously and when she weighed herself this morning she was 240lb. She states she noticed her clothes fitting tighter. Denies any cough but has reported  increased use of her inhaler. Was using 3L oxygen at home in the past but not on home oxygen anymore.   In the ED, the patient was noted to be dyspneic and speaking in short sentences with oxygen saturation into the 80s with ambulation as well as 2+ pitting edema to BLE. Lab work notable for CBC and BMP that were both WNL and a BNP of 136. EKG showed sinus bradycardia. CXR showed cardiomegaly with vascular congestion. She was given one dose of IV lasix 60mg .   Review Of Systems: Per HPI with the following additions:   Pertinent Past Medical History: COPD HFpEF HTN HLD Chronic pain DMII PAD Carotid artery stenosis Anemia Anxiety Depression Sleep apnea  Remainder reviewed in history tab.   Pertinent Past Surgical History: Left Knee replacement  Stent placement in R thigh  Remainder reviewed in history tab.  Pertinent Social History: Tobacco use: 1/2 a pack for over 52yrs, Quit a year ago Alcohol use: Rarely Other Substance use: None Lives with daughter, 3 great grand and son inlaw  Pertinent Family History: Father- heart disease  Remainder reviewed in history tab.   Important Outpatient Medications:  Atorvastatin 80 mg daily Aspirin 81 mg daily Zetia 10 mg daily Pletal 100 mg twice daily Iron supplement daily Trelegy daily  albuterol nebulizer Lasix 40 mg twice daily Gabapentin 600 twice daily DuoNeb as needed Robaxin 500 mg daily Toprol 25 mg daily Octreotide 100 mcg 3 times a day 3 L oxygen Protonix 40 mg twice a day Potassium chloride 20 mill equivalents daily Entresto twice daily  Remainder reviewed in medication history.   Objective: BP (!) 159/46 (BP Location: Right Arm)   Pulse (!) 56   Temp 98.3 F (36.8 C) (Oral)   Resp 15   Ht 4\' 11"  (1.499 m)   Wt 109.8 kg   LMP  (LMP  Unknown)   SpO2 (!) 86%   BMI 48.88 kg/m  Exam: Physical Exam Constitutional:      General: She is not in acute distress.    Appearance: She is well-developed. She is not ill-appearing.  Neck:     Vascular: No JVD.  Cardiovascular:     Rate and Rhythm: Regular rhythm. Bradycardia present.     Heart sounds: Normal heart sounds.     No S3 or S4 sounds.  Pulmonary:     Effort: Pulmonary effort is normal. No respiratory distress.     Breath sounds: Normal breath sounds. No wheezing.  Abdominal:     General: Bowel sounds are normal. There is distension.     Tenderness: There is no abdominal tenderness.  Musculoskeletal:     Right lower leg: 2+ Pitting Edema present.     Left lower leg: 2+ Pitting Edema present.  Skin:    General: Skin is warm and dry.  Neurological:     Mental Status: She is alert.  Psychiatric:  Attention and Perception: Attention normal.        Mood and Affect: Mood normal.        Speech: Speech normal.        Behavior: Behavior normal.     Labs:  CBC BMET  Recent Labs  Lab 01/29/23 1447  WBC 6.0  HGB 13.4  HCT 42.4  PLT 344   Recent Labs  Lab 01/29/23 1904  NA 136  K 4.3  CL 99  CO2 26  BUN 7*  CREATININE 0.87  GLUCOSE 150*  CALCIUM 8.7*    Pertinent additional labs: BNP 136  EKG: My own interpretation (not copied from electronic read) Sinus bradycardia   Imaging Studies Performed: Chest x-ray Impression : Cardiomegaly with vascular congestion.    Jerre Simon, MD 01/29/2023, 11:28 PM PGY-3, [facility]  FPTS Intern pager: [phone], text pages welcome Secure chat group [facility] Teaching Service

## 2023-01-29 NOTE — ED Provider Notes (Signed)
I provided a substantive portion of the care of this patient.  I personally made/approved the management plan for this patient and take responsibility for the patient management.  EKG Interpretation Date/Time:  Wednesday January 29 2023 13:55:20 EDT Ventricular Rate:  52 PR Interval:  164 QRS Duration:  82 QT Interval:  456 QTC Calculation: 424 R Axis:   99  Text Interpretation: Sinus bradycardia Rightward axis Cannot rule out Anterior infarct , age undetermined Abnormal ECG When compared with ECG of 25-Feb-2022 19:58, PREVIOUS ECG IS PRESENT Confirmed by Lorre Nick ([id number]) on 01/29/2023 8:01:43 PM   Patient is EKG per interpretation shows sinus bradycardia.  No ischemic changes noted. 72 year old female presents with increasing dyspnea exertion.  Has been out of her Lasix times several days.  Has history of CHF.  Chest x-ray shows increasing fluid overload.  Will be given IV Lasix and admit for observation.   Lorre Nick, MD 01/29/23 2002

## 2023-01-30 ENCOUNTER — Emergency Department (HOSPITAL_COMMUNITY): Payer: Medicare HMO

## 2023-01-30 DIAGNOSIS — R0602 Shortness of breath: Secondary | ICD-10-CM | POA: Diagnosis present

## 2023-01-30 DIAGNOSIS — R0902 Hypoxemia: Secondary | ICD-10-CM | POA: Diagnosis present

## 2023-01-30 DIAGNOSIS — Z7951 Long term (current) use of inhaled steroids: Secondary | ICD-10-CM | POA: Diagnosis not present

## 2023-01-30 DIAGNOSIS — E1142 Type 2 diabetes mellitus with diabetic polyneuropathy: Secondary | ICD-10-CM | POA: Diagnosis present

## 2023-01-30 DIAGNOSIS — G8929 Other chronic pain: Secondary | ICD-10-CM | POA: Diagnosis present

## 2023-01-30 DIAGNOSIS — E876 Hypokalemia: Secondary | ICD-10-CM | POA: Diagnosis not present

## 2023-01-30 DIAGNOSIS — Z87891 Personal history of nicotine dependence: Secondary | ICD-10-CM | POA: Diagnosis not present

## 2023-01-30 DIAGNOSIS — Z7902 Long term (current) use of antithrombotics/antiplatelets: Secondary | ICD-10-CM | POA: Diagnosis not present

## 2023-01-30 DIAGNOSIS — R0609 Other forms of dyspnea: Secondary | ICD-10-CM

## 2023-01-30 DIAGNOSIS — J449 Chronic obstructive pulmonary disease, unspecified: Secondary | ICD-10-CM | POA: Diagnosis present

## 2023-01-30 DIAGNOSIS — Z91148 Patient's other noncompliance with medication regimen for other reason: Secondary | ICD-10-CM | POA: Diagnosis not present

## 2023-01-30 DIAGNOSIS — Z91199 Patient's noncompliance with other medical treatment and regimen due to unspecified reason: Secondary | ICD-10-CM | POA: Diagnosis not present

## 2023-01-30 DIAGNOSIS — I509 Heart failure, unspecified: Secondary | ICD-10-CM

## 2023-01-30 DIAGNOSIS — R600 Localized edema: Secondary | ICD-10-CM

## 2023-01-30 DIAGNOSIS — E1151 Type 2 diabetes mellitus with diabetic peripheral angiopathy without gangrene: Secondary | ICD-10-CM | POA: Diagnosis present

## 2023-01-30 DIAGNOSIS — Z79899 Other long term (current) drug therapy: Secondary | ICD-10-CM | POA: Diagnosis not present

## 2023-01-30 DIAGNOSIS — Z96652 Presence of left artificial knee joint: Secondary | ICD-10-CM | POA: Diagnosis present

## 2023-01-30 DIAGNOSIS — K219 Gastro-esophageal reflux disease without esophagitis: Secondary | ICD-10-CM | POA: Diagnosis present

## 2023-01-30 DIAGNOSIS — I110 Hypertensive heart disease with heart failure: Secondary | ICD-10-CM | POA: Diagnosis present

## 2023-01-30 DIAGNOSIS — Z95820 Peripheral vascular angioplasty status with implants and grafts: Secondary | ICD-10-CM | POA: Diagnosis not present

## 2023-01-30 DIAGNOSIS — I5023 Acute on chronic systolic (congestive) heart failure: Secondary | ICD-10-CM | POA: Diagnosis present

## 2023-01-30 DIAGNOSIS — Z7982 Long term (current) use of aspirin: Secondary | ICD-10-CM | POA: Diagnosis not present

## 2023-01-30 DIAGNOSIS — I2510 Atherosclerotic heart disease of native coronary artery without angina pectoris: Secondary | ICD-10-CM | POA: Diagnosis present

## 2023-01-30 DIAGNOSIS — E785 Hyperlipidemia, unspecified: Secondary | ICD-10-CM | POA: Diagnosis present

## 2023-01-30 LAB — ECHOCARDIOGRAM COMPLETE
AR max vel: 1.8 cm2
AV Area VTI: 1.94 cm2
AV Area mean vel: 1.77 cm2
AV Mean grad: 10.3 mmHg
AV Peak grad: 18.4 mmHg
Ao pk vel: 2.14 m/s
Area-P 1/2: 2.5 cm2
Height: 59 in
S' Lateral: 1.8 cm
Weight: 3872 oz

## 2023-01-30 MED ORDER — POTASSIUM CHLORIDE CRYS ER 20 MEQ PO TBCR
40.0000 meq | EXTENDED_RELEASE_TABLET | Freq: Once | ORAL | Status: AC
  Administered 2023-01-30: 40 meq via ORAL
  Filled 2023-01-30: qty 2

## 2023-01-30 MED ORDER — SPIRONOLACTONE 25 MG PO TABS
50.0000 mg | ORAL_TABLET | Freq: Every day | ORAL | Status: DC
  Administered 2023-01-30 – 2023-01-31 (×2): 50 mg via ORAL
  Filled 2023-01-30: qty 2
  Filled 2023-01-30: qty 4

## 2023-01-30 MED ORDER — POTASSIUM CHLORIDE 20 MEQ PO PACK
40.0000 meq | PACK | Freq: Once | ORAL | Status: AC
  Administered 2023-01-30: 40 meq via ORAL
  Filled 2023-01-30: qty 2

## 2023-01-30 MED ORDER — FUROSEMIDE 10 MG/ML IJ SOLN
60.0000 mg | Freq: Once | INTRAMUSCULAR | Status: AC
  Administered 2023-01-30: 60 mg via INTRAVENOUS
  Filled 2023-01-30: qty 6

## 2023-01-30 MED ORDER — SACUBITRIL-VALSARTAN 24-26 MG PO TABS
1.0000 | ORAL_TABLET | Freq: Two times a day (BID) | ORAL | Status: DC
  Administered 2023-01-30 – 2023-01-31 (×3): 1 via ORAL
  Filled 2023-01-30 (×3): qty 1

## 2023-01-30 MED ORDER — MAGNESIUM SULFATE 2 GM/50ML IV SOLN
2.0000 g | Freq: Once | INTRAVENOUS | Status: AC
  Administered 2023-01-30: 2 g via INTRAVENOUS
  Filled 2023-01-30: qty 50

## 2023-01-30 MED ORDER — MAGNESIUM SULFATE IN D5W 1-5 GM/100ML-% IV SOLN
1.0000 g | Freq: Once | INTRAVENOUS | Status: AC
  Administered 2023-01-30: 1 g via INTRAVENOUS
  Filled 2023-01-30: qty 100

## 2023-01-30 NOTE — Progress Notes (Signed)
Echocardiogram 2D Echocardiogram has been performed.  [name] 01/30/2023, 2:22 PM

## 2023-01-30 NOTE — Plan of Care (Signed)
  Problem: Clinical Measurements: Goal: Ability to maintain clinical measurements within normal limits will improve Outcome: Progressing   Problem: Clinical Measurements: Goal: Will remain free from infection Outcome: Progressing   Problem: Education: Goal: Knowledge of General Education information will improve Description: Including pain rating scale, medication(s)/side effects and non-pharmacologic comfort measures Outcome: Progressing   Problem: Health Behavior/Discharge Planning: Goal: Ability to manage health-related needs will improve Outcome: Progressing

## 2023-01-30 NOTE — Assessment & Plan Note (Addendum)
Patient's constellation of symptoms including orthopnea, dyspnea with exertion, bilateral lower extremity edema and checks x-ray finding of cardiomegaly with vascular congestion is strongly suggestive of CHF exacerbation in the setting of noncompliance to home Lasix .  Unclear of patient's dry weight however she has reported over 30 pound weight gain in the last month.  She is s/p 60 mg IV Lasix with good urine output.  Will continue diuresis with close monitoring of kidney function and electrolytes. Presenting weight of 242 lbs >>> lbs current, will get an additional lasix dose -Admit to med telemetry, Dr. Jennette Kettle attending -S/p IV Lasix 60 mg  -Daily weight -Strict ins and outs -Daily BMP - Restart home Entresto and spironolactone

## 2023-01-30 NOTE — Assessment & Plan Note (Signed)
Suspect patient's dyspnea is more likely in the setting of HF exacerbation versus COPD.  No wheezing on exam, and denies any productive cough.  Right leg have good work of breathing on RA with reassuring O2 sats.  Home  medication included as needed albuterol, DuoNeb and daily controller with Trelegy. -As needed albuterol -As needed DuoNeb -Daily Incruse  + Breo

## 2023-01-30 NOTE — Progress Notes (Signed)
PT Cancellation Note  Patient Details Name: Rosalynda Tomek MRN: [id number] DOB: 18-Nov-1950   Cancelled Treatment:    Reason Eval/Treat Not Completed: PT screened, no needs identified, will sign off (independent with nursing in ED)  Lyanne Co, PT  Acute Rehab Services Secure chat preferred Office [phone]    Lawana Chambers  01/30/2023, 12:56 PM

## 2023-01-30 NOTE — Assessment & Plan Note (Signed)
Mild bradycardia in the 50s.  This seems to be a chronic issue for patient and could be attributed to her beta-blocker.  Currently asymptomatic. -Continuous cardiac monitoring.

## 2023-01-30 NOTE — Progress Notes (Signed)
OT Cancellation Note  Patient Details Name: [name] MRN: [id number] DOB: [birth year]   Cancelled Treatment:    Reason Eval/Treat Not Completed: OT screened, no needs identified, will sign off (Discused with PT)  ,HILLARY 01/30/2023, 1:14 PM [name], OT/L   Acute OT Clinical Specialist Acute Rehabilitation Services Pager [phone] Office [phone]

## 2023-01-30 NOTE — Progress Notes (Addendum)
Daily Progress Note Intern Pager: [phone]  Patient name: [name] Medical record number: [id number] Date of birth: 10/17/1950 Age: 72 y.o. Gender: female  Primary Care Provider: Elayne Guerin, MD Consultants: None Code Status: Full Code  Pt Overview and Major Events to Date:  8/7 Admission, lasix x1 8/8 Additional lasix x1  Assessment and Plan:  [name] is a 72 y.o. female w/ PMHx of CAD, PAD, CHF, COPD and HTN who presented with 1 week of worsening dyspnea and lower edema swelling after non-compliance after running out of home lasix. She was admitted for suspected CHF exacerbation. She was diuresed w/ 60 mg lasix IV in the ED. Strict I/Os and daily weights ordered.  Awaiting for patient to come back to the floor and will give another round of Lasix 60 mg to optimize diuresis. Also awaiting echo results to evaluate any change in cardiac structural disease.  Restarted her home Entresto and spironolactone.   [facility]     * (Principal) CHF exacerbation [facility])     Patient's constellation of symptoms including orthopnea, dyspnea with  exertion, bilateral lower extremity edema and checks x-ray finding of  cardiomegaly with vascular congestion is strongly suggestive of CHF  exacerbation in the setting of noncompliance to home Lasix .  Unclear of  patient's dry weight however she has reported over 30 pound weight gain in  the last month.  She is s/p 60 mg IV Lasix with good urine output.  Will  continue diuresis with close monitoring of kidney function and  electrolytes. Presenting weight of 242 lbs >>> lbs current  -Admit to med telemetry, Dr. Jennette Kettle attending -S/p IV Lasix 60 mg -Daily weight -Strict ins and outs -Daily BMP -Holding home Entresto        Bradycardia     Mild bradycardia in the 50s.  This seems to be a chronic issue for  patient and could be attributed to her beta-blocker.  Currently  asymptomatic. -Continuous cardiac  monitoring.        COPD (chronic obstructive pulmonary disease) (HCC)     Suspect patient's dyspnea is more likely in the setting of HF  exacerbation versus COPD.  No wheezing on exam, and denies any productive  cough.  Right leg have good work of breathing on RA with reassuring O2  sats.  Home  medication included as needed albuterol, DuoNeb and daily  controller with Trelegy. -As needed albuterol -As needed DuoNeb -Daily Incruse + Breo     FEN/GI: Regular PPx: Lovenox  Dispo:Home pending clinical improvement . Barriers include resolution .   Subjective:  Patient still has some shortness of breath with moving around in room today. She recently ran out of her Lasix about 3 weeks ago. Patient lives with her daughter, medication pills and organizer, and the patient forgot to pick up her refill.  Patient prior dry weight was 202 pounds earlier this month, and realized last that she was having swelling in her lower legs all the way up to her thighs while at home. Tolerating regular diet well here without nausea vomiting.  Denies chest pain, fevers, chills, urinary or bowel issues.  Objective: Temp:  [97.7 F (36.5 C)-98.3 F (36.8 C)] 98.1 F (36.7 C) (08/08 1029) Pulse Rate:  [51-73] 67 (08/08 1410) Resp:  [12-28] 26 (08/08 1410) BP: (105-195)/(46-113) 150/65 (08/08 1215) SpO2:  [86 %-98 %] 96 % (08/08 1410)  Physical Exam Constitutional:  Appearance: She is well-developed. She is obese.  HENT:     Head: Normocephalic.  Eyes:     Extraocular Movements: Extraocular movements intact.     Pupils: Pupils are equal, round, and reactive to light.  Cardiovascular:     Rate and Rhythm: Bradycardia present.     Heart sounds: No murmur heard. Pulmonary:     Breath sounds: No wheezing.     Comments: Decreased sounds, possibly due to habitus  Abdominal:     Tenderness: There is no abdominal tenderness.  Musculoskeletal:        General: Normal range of motion.  Skin:    General:  Skin is warm.  Neurological:     General: No focal deficit present.     Mental Status: She is alert and oriented to person, place, and time.     Cranial Nerves: No cranial nerve deficit.     Sensory: No sensory deficit.     Motor: No weakness.     Laboratory: Most recent CBC Lab Results  Component Value Date   WBC 6.6 01/30/2023   HGB 13.7 01/30/2023   HCT 42.7 01/30/2023   MCV 88.2 01/30/2023   PLT 390 01/30/2023   Most recent BMP    Latest Ref Rng & Units 01/30/2023    4:21 AM  BMP  Glucose 70 - 99 mg/dL 254   BUN 8 - 23 mg/dL 7   Creatinine 2.70 - 6.23 mg/dL 7.62   Sodium 831 - 517 mmol/L 139   Potassium 3.5 - 5.1 mmol/L 3.4   Chloride 98 - 111 mmol/L 100   CO2 22 - 32 mmol/L 27   Calcium 8.9 - 10.3 mg/dL 9.1    Other pertinent labs BNP 136  K 3.4 >>  Mg 1.5 >>>    Imaging/Diagnostic Tests:  8/7 CXR:   Radiologist Impression: Cardiomegaly with chronic vascular congestion and possible minimally increased interstitial edema.  My interpretation: Chronic vascular congestion, some minimal interstitial edema   Peterson Ao, MD 01/30/2023, 2:29 PM  PGY-1, [facility] Health Psychiatry Resident  FPTS Intern pager: [phone], text pages welcome Secure chat group [facility] Teaching Service   CALL PAGER [phone] for any questions or notifications regarding this patient   FMTS Attending Daily Note: Denny Levy MD  Attending [phone]  office [phone]  I  have seen and examined this patient, reviewed their chart. I have discussed this patient with the resident. I agree with the resident's findings, assessment and care plan.

## 2023-01-30 NOTE — ED Notes (Signed)
ED TO INPATIENT HANDOFF REPORT  ED Nurse Name and Phone #: [phone]   S Name/Age/Gender [name] 72 y.o. female Room/Bed: 001C/001C  Code Status   Code Status: Full Code  Home/SNF/Other Home Patient oriented to: self, place, time, and situation Is this baseline? Yes   Triage Complete: Triage complete  Chief Complaint CHF exacerbation (HCC) [I50.9]  Triage Note Pt arrives via POV. Pt reports sob and swelling to bilateral legs. Pt states she hasn't had her lasix in about 3 weeks. Pt AxOx4. Pt denies cp. Pitting edema noted in triage.    Allergies No Known Allergies  Level of Care/Admitting Diagnosis ED Disposition     ED Disposition  Admit   Condition  --   Comment  Hospital Area: [facility] [[id number]]  Level of Care: Telemetry Medical [[id number]]  May place patient in observation at [facility] or [facility] if equivalent level of care is available:: No  Covid Evaluation: Asymptomatic - no recent exposure (last 10 days) testing not required  Diagnosis: CHF exacerbation [facility]) [[id number]]  Admitting Physician: Nestor Ramp [[id number]]  Attending Physician: Nestor Ramp [[id number]]          B Medical/Surgery History Past Medical History:  Diagnosis Date   Anemia 12/26/2015   Anxiety    Arthritis    Blood transfusion without reported diagnosis    Chronic pain    resolved per pt 04/12/20   COPD (chronic obstructive pulmonary disease) (HCC)    Coronary artery disease    Depression    Diabetes mellitus without complication (HCC)    type 2   GERD (gastroesophageal reflux disease)    Heart murmur    since birth; Echo 02/18/19: LVEF 68%, grade 1 diastolic dysfunction, mild AS (mean grad 12 mmHg), trace MR/PR, mild TR, PASP 32 mmHg     Hyperlipemia    Hypertension    PVD (peripheral vascular disease) (HCC)    right SFA stent 02/11/17 by Dr. Jacinto Halim   Sleep apnea    does not use cpap   Wears glasses    Past Surgical History:  Procedure Laterality  Date   ABDOMINAL HYSTERECTOMY     CARDIAC CATHETERIZATION     CATARACT EXTRACTION     CESAREAN SECTION     COLONOSCOPY N/A 01/08/2013   Procedure: COLONOSCOPY;  Surgeon: Theda Belfast, MD;  Location: [facility] ENDOSCOPY;  Service: Endoscopy;  Laterality: N/A;   COLONOSCOPY N/A 12/28/2015   Procedure: COLONOSCOPY;  Surgeon: Jeani Hawking, MD;  Location: [facility] ENDOSCOPY;  Service: Endoscopy;  Laterality: N/A;   COLONOSCOPY WITH PROPOFOL N/A 10/05/2020   Procedure: COLONOSCOPY WITH PROPOFOL;  Surgeon: Jeani Hawking, MD;  Location: [facility] ENDOSCOPY;  Service: Endoscopy;  Laterality: N/A;   ENTEROSCOPY N/A 12/28/2015   Procedure: ENTEROSCOPY;  Surgeon: Jeani Hawking, MD;  Location: [facility] ENDOSCOPY;  Service: Endoscopy;  Laterality: N/A;   ENTEROSCOPY N/A 02/20/2018   Procedure: ENTEROSCOPY;  Surgeon: Jeani Hawking, MD;  Location: [facility] ENDOSCOPY;  Service: Endoscopy;  Laterality: N/A;   ENTEROSCOPY N/A 03/27/2018   Procedure: ENTEROSCOPY;  Surgeon: Jeani Hawking, MD;  Location: [facility] ENDOSCOPY;  Service: Endoscopy;  Laterality: N/A;   ENTEROSCOPY N/A 10/05/2020   Procedure: ENTEROSCOPY;  Surgeon: Jeani Hawking, MD;  Location: [facility] ENDOSCOPY;  Service: Endoscopy;  Laterality: N/A;   ENTEROSCOPY N/A 05/11/2021   Procedure: ENTEROSCOPY;  Surgeon: Jeani Hawking, MD;  Location: [facility] ENDOSCOPY;  Service: Endoscopy;  Laterality: N/A;   ENTEROSCOPY N/A 09/23/2021   Procedure: ENTEROSCOPY;  Surgeon: Myrtie Neither,  Andreas Blower, MD;  Location: [facility] ENDOSCOPY;  Service: Gastroenterology;  Laterality: N/A;   ESOPHAGOGASTRODUODENOSCOPY N/A 01/08/2013   Procedure: ESOPHAGOGASTRODUODENOSCOPY (EGD);  Surgeon: Theda Belfast, MD;  Location: [facility] ENDOSCOPY;  Service: Endoscopy;  Laterality: N/A;   EYE SURGERY     GIVENS CAPSULE STUDY N/A 09/24/2021   Procedure: GIVENS CAPSULE STUDY;  Surgeon: Jeani Hawking, MD;  Location: [facility] ENDOSCOPY;  Service: Gastroenterology;  Laterality: N/A;   HAND SURGERY     HEMOSTASIS CLIP PLACEMENT  05/11/2021   Procedure: HEMOSTASIS  CLIP PLACEMENT;  Surgeon: Jeani Hawking, MD;  Location: [facility] ENDOSCOPY;  Service: Endoscopy;;   HOT HEMOSTASIS N/A 12/28/2015   Procedure: HOT HEMOSTASIS (ARGON PLASMA COAGULATION/BICAP);  Surgeon: Jeani Hawking, MD;  Location: [facility] ENDOSCOPY;  Service: Endoscopy;  Laterality: N/A;   HOT HEMOSTASIS N/A 02/20/2018   Procedure: HOT HEMOSTASIS (ARGON PLASMA COAGULATION/BICAP);  Surgeon: Jeani Hawking, MD;  Location: [facility] ENDOSCOPY;  Service: Endoscopy;  Laterality: N/A;   HOT HEMOSTASIS N/A 03/27/2018   Procedure: HOT HEMOSTASIS (ARGON PLASMA COAGULATION/BICAP);  Surgeon: Jeani Hawking, MD;  Location: [facility] ENDOSCOPY;  Service: Endoscopy;  Laterality: N/A;   HOT HEMOSTASIS N/A 10/05/2020   Procedure: HOT HEMOSTASIS (ARGON PLASMA COAGULATION/BICAP);  Surgeon: Jeani Hawking, MD;  Location: [facility] ENDOSCOPY;  Service: Endoscopy;  Laterality: N/A;   HOT HEMOSTASIS N/A 05/11/2021   Procedure: HOT HEMOSTASIS (ARGON PLASMA COAGULATION/BICAP);  Surgeon: Jeani Hawking, MD;  Location: [facility] ENDOSCOPY;  Service: Endoscopy;  Laterality: N/A;   LEFT HEART CATHETERIZATION WITH CORONARY ANGIOGRAM N/A 03/09/2013   Procedure: LEFT HEART CATHETERIZATION WITH CORONARY ANGIOGRAM;  Surgeon: Pamella Pert, MD;  Location[facility] CATH LAB;  Service: Cardiovascular;  Laterality: N/A;   LOWER EXTREMITY ANGIOGRAM N/A 12/29/2012   Procedure: LOWER EXTREMITY ANGIOGRAM;  Surgeon: Pamella Pert, MD;  Location: [facility] CATH LAB;  Service: Cardiovascular;  Laterality: N/A;   LOWER EXTREMITY ANGIOGRAM N/A 10/05/2013   Procedure: LOWER EXTREMITY ANGIOGRAM;  Surgeon: Pamella Pert, MD;  [facility] CATH LAB;  Service: Cardiovascular;  Laterality: N/A;   LOWER EXTREMITY ANGIOGRAPHY N/A 02/11/2017   Procedure: Lower Extremity Angiography;  Surgeon: Yates Decamp, MD;  Location: [facility] INVASIVE CV LAB;  Service: Cardiovascular;  Laterality: N/A;   PERIPHERAL VASCULAR BALLOON ANGIOPLASTY  02/11/2017   Procedure: PERIPHERAL VASCULAR BALLOON ANGIOPLASTY;  Surgeon: Yates Decamp, MD;  Location: [facility] INVASIVE CV LAB;  Service: Cardiovascular;;  Right SFA   PERIPHERAL VASCULAR INTERVENTION Right 02/11/2017   Procedure: PERIPHERAL VASCULAR INTERVENTION;  Surgeon: Yates Decamp, MD;  Location: [facility] INVASIVE CV LAB;  Service: Cardiovascular;  Laterality: Right;  Rt SFA   POLYPECTOMY  10/05/2020   Procedure: POLYPECTOMY;  Surgeon: Jeani Hawking, MD;  Location: [facility] ENDOSCOPY;  Service: Endoscopy;;   stent in right leg  12/29/2012   for blood clot, Dr. Nadara Eaton   TOTAL KNEE ARTHROPLASTY Left 04/18/2020   TOTAL KNEE ARTHROPLASTY Left 04/18/2020   Procedure: LEFT TOTAL KNEE ARTHROPLASTY;  Surgeon: Cammy Copa, MD;  Location: [facility] OR;  Service: Orthopedics;  Laterality: Left;   TYMPANOMASTOIDECTOMY Left 03/08/2014   Procedure: TYMPANOMASTOIDECTOMY LEFT;  Surgeon: Darletta Moll, MD;  Location: [facility];  Service: ENT;  Laterality: Left;   TYMPANOMASTOIDECTOMY  2015   UTERINE FIBROID SURGERY       A IV Location/Drains/Wounds Patient Lines/Drains/Airways Status     Active Line/Drains/Airways     Name Placement date Placement time Site Days   Peripheral IV 01/29/23 20 G Right Antecubital 01/29/23  1706  Antecubital  1  Intake/Output Last 24 hours  Intake/Output Summary (Last 24 hours) at 01/30/2023 1248 Last data filed at 01/30/2023 0744 Gross per 24 hour  Intake 100 ml  Output --  Net 100 ml    Labs/Imaging Results for orders placed or performed during the hospital encounter of 01/29/23 (from the past 48 hour(s))  CBC     Status: Abnormal   Collection Time: 01/29/23  2:47 PM  Result Value Ref Range   WBC 6.0 4.0 - 10.5 K/uL   RBC 4.77 3.87 - 5.11 MIL/uL   Hemoglobin 13.4 12.0 - 15.0 g/dL   HCT 16.1 09.6 - 04.5 %   MCV 88.9 80.0 - 100.0 fL   MCH 28.1 26.0 - 34.0 pg   MCHC 31.6 30.0 - 36.0 g/dL   RDW 40.9 (H) 81.1 - 91.4 %   Platelets 344 150 - 400 K/uL   nRBC 0.0 0.0 - 0.2 %    Comment: Performed at [facility] Lab, [address], [place], [place] [address]  Brain natriuretic peptide     Status: Abnormal   Collection Time: 01/29/23  2:47 PM  Result Value Ref Range   B Natriuretic Peptide 136.2 (H) 0.0 - 100.0 pg/mL    Comment: Performed at [facility], [address], [place], [place] [address]  Basic metabolic panel     Status: Abnormal   Collection Time: 01/29/23  6:26 PM  Result Value Ref Range   Sodium QUANTITY NOT SUFFICIENT, UNABLE TO PERFORM TEST 135 - 145 mmol/L    Comment: CALLED TO M GOWENS,PA 1900 01/29/2023 WBOND   Potassium QUANTITY NOT SUFFICIENT, UNABLE TO PERFORM TEST 3.5 - 5.1 mmol/L   Chloride QUANTITY NOT SUFFICIENT, UNABLE TO PERFORM TEST 98 - 111 mmol/L   CO2 QUANTITY NOT SUFFICIENT, UNABLE TO PERFORM TEST 22 - 32 mmol/L   Glucose, Bld 145 (H) 70 - 99 mg/dL    Comment: Glucose reference range applies only to samples taken after fasting for at least 8 hours.   BUN QUANTITY NOT SUFFICIENT, UNABLE TO PERFORM TEST 8 - 23 mg/dL   Creatinine, Ser 8.65 0.44 - 1.00 mg/dL   Calcium QUANTITY NOT SUFFICIENT, UNABLE TO PERFORM TEST 8.9 - 10.3 mg/dL   GFR, Estimated >78 >46 mL/min    Comment: (NOTE) Calculated using the CKD-EPI Creatinine Equation (2021)    Anion gap NOT CALCULATED 5 - 15    Comment: Performed at [facility], [address], [place], [place] [address]  Basic metabolic panel     Status: Abnormal   Collection Time: 01/29/23  7:04 PM  Result Value Ref Range   Sodium 136 135 - 145 mmol/L   Potassium 4.3 3.5 - 5.1 mmol/L   Chloride 99 98 - 111 mmol/L   CO2 26 22 - 32 mmol/L   Glucose, Bld 150 (H) 70 - 99 mg/dL    Comment: Glucose reference range applies only to samples taken after fasting for at least 8 hours.   BUN 7 (L) 8 - 23 mg/dL   Creatinine, Ser 2.84 0.44 - 1.00 mg/dL   Calcium 8.7 (L) 8.9 - 10.3 mg/dL   GFR, Estimated >13 >24 mL/min    Comment: (NOTE) Calculated using the CKD-EPI Creatinine Equation (2021)    Anion gap 11 5 - 15    Comment: Performed at [facility], [address] [place], [place] [address]  Basic metabolic panel     Status: Abnormal   Collection Time: 01/30/23  4:21 AM  Result Value Ref Range   Sodium 139 135 - 145 mmol/L   Potassium 3.4 (L) 3.5 - 5.1 mmol/L   Chloride 100 98 - 111 mmol/L   CO2 27 22 - 32 mmol/L   Glucose, Bld 152 (H) 70 - 99 mg/dL    Comment: Glucose reference range applies only to samples taken after fasting for at least 8 hours.   BUN 7 (L) 8 - 23 mg/dL   Creatinine, Ser 4.62 0.44 - 1.00 mg/dL   Calcium 9.1 8.9 - 70.3 mg/dL   GFR, Estimated >50 >09 mL/min    Comment: (NOTE) Calculated using the CKD-EPI Creatinine Equation (2021)    Anion gap 12 5 - 15    Comment: Performed at [facility], [address], [place], [place] [address]  CBC     Status: Abnormal   Collection Time: 01/30/23  4:21 AM  Result Value Ref Range   WBC 6.6 4.0 - 10.5 K/uL   RBC 4.84 3.87 - 5.11 MIL/uL   Hemoglobin 13.7 12.0 - 15.0 g/dL   HCT 99.3 71.6 - 96.7 %   MCV 88.2 80.0 - 100.0 fL   MCH 28.3 26.0 - 34.0 pg   MCHC 32.1 30.0 - 36.0 g/dL   RDW 89.3 (H) 81.0 - 17.5 %   Platelets 390 150 - 400 K/uL   nRBC 0.0 0.0 - 0.2 %    Comment: Performed at [facility], [address] [place], [place] [address]  Magnesium     Status: Abnormal   Collection Time: 01/30/23  4:21 AM  Result Value Ref Range   Magnesium 1.5 (L) 1.7 - 2.4 mg/dL    Comment: Performed at [facility], [address] [place], [place] [address]   DG Chest 2 View  Result Date: 01/29/2023 CLINICAL DATA:  Shortness of breath with weight gain over the last couple days. EXAM: CHEST - 2 VIEW COMPARISON:  Radiographs 09/04/2022 and 02/25/2022.  CT 07/17/2022. FINDINGS: Stable cardiomegaly, aortic atherosclerosis and chronic vascular congestion. Possible minimally increased interstitial edema compared with the prior study. No confluent airspace disease, pneumothorax or significant pleural effusion. Stable mild degenerative changes in the  spine. IMPRESSION: Cardiomegaly with chronic vascular congestion and possible minimally increased interstitial edema. Electronically Signed   By: Carey Bullocks M.D.   On: 01/29/2023 15:26    Pending Labs Unresulted Labs (From admission, onward)     Start     Ordered   01/31/23 0500  Basic metabolic panel  Tomorrow morning,   R        01/30/23 1106   01/31/23 0500  Magnesium  Tomorrow morning,   R        01/30/23 1106            Vitals/Pain Today's Vitals   01/30/23 1115 01/30/23 1145 01/30/23 1200 01/30/23 1215  BP: (!) 149/77 137/77 (!) 141/61 (!) 150/65  Pulse: 65 60 61 (!) 57  Resp: (!) 28 (!) 22 (!) 28 (!) 22  Temp:      TempSrc:      SpO2: 91% 93% 94% 95%  Weight:      Height:      PainSc:        Isolation Precautions No active isolations  Medications Medications  acetaminophen (TYLENOL) tablet 1,000 mg (has no administration in time range)  aspirin EC tablet 81 mg (81 mg Oral Given 01/30/23 0951)  atorvastatin (LIPITOR) tablet 80 mg (80 mg Oral  Given 01/30/23 0951)  ezetimibe (ZETIA) tablet 10 mg (10 mg Oral Given 01/30/23 0950)  metoprolol succinate (TOPROL-XL) 24 hr tablet 25 mg (25 mg Oral Given 01/30/23 0951)  nitroGLYCERIN (NITROSTAT) SL tablet 0.4 mg (has no administration in time range)  octreotide (SANDOSTATIN) injection 100 mcg (100 mcg Subcutaneous Given 01/30/23 0951)  pantoprazole (PROTONIX) EC tablet 40 mg (40 mg Oral Given 01/30/23 0951)  ferrous sulfate tablet 325 mg (325 mg Oral Given 01/30/23 0951)  ipratropium-albuterol (DUONEB) 0.5-2.5 (3) MG/3ML nebulizer solution 3 mL (has no administration in time range)  fluticasone furoate-vilanterol (BREO ELLIPTA) 100-25 MCG/ACT 1 puff (1 puff Inhalation Given 01/30/23 0737)    And  umeclidinium bromide (INCRUSE ELLIPTA) 62.5 MCG/ACT 1 puff (1 puff Inhalation Given 01/30/23 0738)  enoxaparin (LOVENOX) injection 55 mg (55 mg Subcutaneous Given 01/30/23 0950)  magnesium sulfate IVPB 2 g 50 mL (2 g Intravenous New Bag/Given  01/30/23 1214)  spironolactone (ALDACTONE) tablet 50 mg (50 mg Oral Given 01/30/23 1212)  sacubitril-valsartan (ENTRESTO) 24-26 mg per tablet (1 tablet Oral Given 01/30/23 1211)  sacubitril-valsartan (ENTRESTO) 24-26 mg per tablet (1 tablet Oral Given 01/29/23 1940)  furosemide (LASIX) injection 60 mg (60 mg Intravenous Given 01/29/23 1957)  magnesium sulfate IVPB 1 g 100 mL (0 g Intravenous Stopped 01/30/23 0744)  potassium chloride (KLOR-CON) packet 40 mEq (40 mEq Oral Given 01/30/23 0600)  potassium chloride SA (KLOR-CON M) CR tablet 40 mEq (40 mEq Oral Given 01/30/23 1212)  furosemide (LASIX) injection 60 mg (60 mg Intravenous Given 01/30/23 1214)    Mobility walks     Focused Assessments Cardiac Assessment Handoff:  Cardiac Rhythm: Normal sinus rhythm Lab Results  Component Value Date   CKTOTAL 32 (L) 09/04/2022   CKMB 2.2 01/11/2011   TROPONINI <0.30 05/12/2012   Lab Results  Component Value Date   DDIMER 0.66 (H) 01/10/2011   Does the Patient currently have chest pain? No    R Recommendations: See Admitting Provider Note  Report given to:   Additional Notes:

## 2023-01-30 NOTE — ED Notes (Signed)
ED TO INPATIENT HANDOFF REPORT  ED Nurse Name and Phone #: Marchelle Folks [phone]  S Name/Age/Gender [name] 72 y.o. female Room/Bed: 029C/029C  Code Status   Code Status: Full Code  Home/SNF/Other Home Patient oriented to: self, place, time, and situation Is this baseline? Yes   Triage Complete: Triage complete  Chief Complaint CHF exacerbation (HCC) [I50.9]  Triage Note Pt arrives via POV. Pt reports sob and swelling to bilateral legs. Pt states she hasn't had her lasix in about 3 weeks. Pt AxOx4. Pt denies cp. Pitting edema noted in triage.    Allergies No Known Allergies  Level of Care/Admitting Diagnosis ED Disposition     ED Disposition  Admit   Condition  --   Comment  Hospital Area: [facility] [[id number]]  Level of Care: Telemetry Medical [[id number]]  May place patient in observation at [facility] or [facility] if equivalent level of care is available:: No  Covid Evaluation: Asymptomatic - no recent exposure (last 10 days) testing not required  Diagnosis: CHF exacerbation [facility]) [[id number]]  Admitting Physician: Nestor Ramp [[id number]]  Attending Physician: Nestor Ramp [[id number]]          B Medical/Surgery History Past Medical History:  Diagnosis Date   Anemia 12/26/2015   Anxiety    Arthritis    Blood transfusion without reported diagnosis    Chronic pain    resolved per pt 04/12/20   COPD (chronic obstructive pulmonary disease) (HCC)    Coronary artery disease    Depression    Diabetes mellitus without complication (HCC)    type 2   GERD (gastroesophageal reflux disease)    Heart murmur    since birth; Echo 02/18/19: LVEF 68%, grade 1 diastolic dysfunction, mild AS (mean grad 12 mmHg), trace MR/PR, mild TR, PASP 32 mmHg     Hyperlipemia    Hypertension    PVD (peripheral vascular disease) (HCC)    right SFA stent 02/11/17 by Dr. Jacinto Halim   Sleep apnea    does not use cpap   Wears glasses    Past Surgical History:  Procedure  Laterality Date   ABDOMINAL HYSTERECTOMY     CARDIAC CATHETERIZATION     CATARACT EXTRACTION     CESAREAN SECTION     COLONOSCOPY N/A 01/08/2013   Procedure: COLONOSCOPY;  Surgeon: Theda Belfast, MD;  Location: [facility] ENDOSCOPY;  Service: Endoscopy;  Laterality: N/A;   COLONOSCOPY N/A 12/28/2015   Procedure: COLONOSCOPY;  Surgeon: Jeani Hawking, MD;  Location: [facility] ENDOSCOPY;  Service: Endoscopy;  Laterality: N/A;   COLONOSCOPY WITH PROPOFOL N/A 10/05/2020   Procedure: COLONOSCOPY WITH PROPOFOL;  Surgeon: Jeani Hawking, MD;  Location: [facility] ENDOSCOPY;  Service: Endoscopy;  Laterality: N/A;   ENTEROSCOPY N/A 12/28/2015   Procedure: ENTEROSCOPY;  Surgeon: Jeani Hawking, MD;  Location: [facility] ENDOSCOPY;  Service: Endoscopy;  Laterality: N/A;   ENTEROSCOPY N/A 02/20/2018   Procedure: ENTEROSCOPY;  Surgeon: Jeani Hawking, MD;  Location: [facility] ENDOSCOPY;  Service: Endoscopy;  Laterality: N/A;   ENTEROSCOPY N/A 03/27/2018   Procedure: ENTEROSCOPY;  Surgeon: Jeani Hawking, MD;  Location: [facility] ENDOSCOPY;  Service: Endoscopy;  Laterality: N/A;   ENTEROSCOPY N/A 10/05/2020   Procedure: ENTEROSCOPY;  Surgeon: Jeani Hawking, MD;  Location: [facility] ENDOSCOPY;  Service: Endoscopy;  Laterality: N/A;   ENTEROSCOPY N/A 05/11/2021   Procedure: ENTEROSCOPY;  Surgeon: Jeani Hawking, MD;  Location: [facility] ENDOSCOPY;  Service: Endoscopy;  Laterality: N/A;   ENTEROSCOPY N/A 09/23/2021   Procedure: ENTEROSCOPY;  Surgeon: Myrtie Neither,  Andreas Blower, MD;  Location: [facility] ENDOSCOPY;  Service: Gastroenterology;  Laterality: N/A;   ESOPHAGOGASTRODUODENOSCOPY N/A 01/08/2013   Procedure: ESOPHAGOGASTRODUODENOSCOPY (EGD);  Surgeon: Theda Belfast, MD;  Location: [facility] ENDOSCOPY;  Service: Endoscopy;  Laterality: N/A;   EYE SURGERY     GIVENS CAPSULE STUDY N/A 09/24/2021   Procedure: GIVENS CAPSULE STUDY;  Surgeon: Jeani Hawking, MD;  Location: [facility] ENDOSCOPY;  Service: Gastroenterology;  Laterality: N/A;   HAND SURGERY     HEMOSTASIS CLIP PLACEMENT  05/11/2021   Procedure:  HEMOSTASIS CLIP PLACEMENT;  Surgeon: Jeani Hawking, MD;  Location: [facility] ENDOSCOPY;  Service: Endoscopy;;   HOT HEMOSTASIS N/A 12/28/2015   Procedure: HOT HEMOSTASIS (ARGON PLASMA COAGULATION/BICAP);  Surgeon: Jeani Hawking, MD;  Location: [facility] ENDOSCOPY;  Service: Endoscopy;  Laterality: N/A;   HOT HEMOSTASIS N/A 02/20/2018   Procedure: HOT HEMOSTASIS (ARGON PLASMA COAGULATION/BICAP);  Surgeon: Jeani Hawking, MD;  Location: [facility] ENDOSCOPY;  Service: Endoscopy;  Laterality: N/A;   HOT HEMOSTASIS N/A 03/27/2018   Procedure: HOT HEMOSTASIS (ARGON PLASMA COAGULATION/BICAP);  Surgeon: Jeani Hawking, MD;  Location: [facility] ENDOSCOPY;  Service: Endoscopy;  Laterality: N/A;   HOT HEMOSTASIS N/A 10/05/2020   Procedure: HOT HEMOSTASIS (ARGON PLASMA COAGULATION/BICAP);  Surgeon: Jeani Hawking, MD;  Location: [facility] ENDOSCOPY;  Service: Endoscopy;  Laterality: N/A;   HOT HEMOSTASIS N/A 05/11/2021   Procedure: HOT HEMOSTASIS (ARGON PLASMA COAGULATION/BICAP);  Surgeon: Jeani Hawking, MD;  Location: [facility] ENDOSCOPY;  Service: Endoscopy;  Laterality: N/A;   LEFT HEART CATHETERIZATION WITH CORONARY ANGIOGRAM N/A 03/09/2013   Procedure: LEFT HEART CATHETERIZATION WITH CORONARY ANGIOGRAM;  Surgeon: Pamella Pert, MD;  Location[facility] C[facility] LAB;  Service: Cardiovascular;  Laterality: N/A;   LOWER EXTREMITY ANGIOGRAM N/A 12/29/2012   Procedure: LOWER EXTREMITY ANGIOGRAM;  Surgeon: Pamella Pert, MD;  Location: [facility] CATH LAB;  Service: Cardiovascular;  Laterality: N/A;   LOWER EXTREMITY ANGIOGRAM N/A 10/05/2013   Procedure: LOWER EXTREMITY ANGIOGRAM;  Surgeon: Pamella Pert, MD;  Location: [facility] CATH LAB;  Service: Cardiovascular;  Laterality: N/A;   LOWER EXTREMITY ANGIOGRAPHY N/A 02/11/2017   Procedure: Lower Extremity Angiography;  Surgeon: Yates Decamp, MD;  Location: [facility] INVASIVE CV LAB;  Service: Cardiovascular;  Laterality: N/A;   PERIPHERAL VASCULAR BALLOON ANGIOPLASTY  02/11/2017   Procedure: PERIPHERAL VASCULAR BALLOON ANGIOPLASTY;   Surgeon: Yates Decamp, MD;  Location: [facility] INVASIVE CV LAB;  Service: Cardiovascular;;  Right SFA   PERIPHERAL VASCULAR INTERVENTION Right 02/11/2017   Procedure: PERIPHERAL VASCULAR INTERVENTION;  Surgeon: Yates Decamp, MD;  Location: [facility] INVASIVE CV LAB;  Service: Cardiovascular;  Laterality: Right;  Rt SFA   POLYPECTOMY  10/05/2020   Procedure: POLYPECTOMY;  Surgeon: Jeani Hawking, MD;  Location: [facility] ENDOSCOPY;  Service: Endoscopy;;   stent in right leg  12/29/2012   for blood clot, Dr. Nadara Eaton   TOTAL KNEE ARTHROPLASTY Left 04/18/2020   TOTAL KNEE ARTHROPLASTY Left 04/18/2020   Procedure: LEFT TOTAL KNEE ARTHROPLASTY;  Surgeon: Cammy Copa, MD;  [facility] OR;  Service: Orthopedics;  Laterality: Left;   TYMPANOMASTOIDECTOMY Left 03/08/2014   Procedure: TYMPANOMASTOIDECTOMY LEFT;  Surgeon: Darletta Moll, MD;  Location: [facility];  Service: ENT;  Laterality: Left;   TYMPANOMASTOIDECTOMY  2015   UTERINE FIBROID SURGERY       A IV Location/Drains/Wounds Patient Lines/Drains/Airways Status     Active Line/Drains/Airways     Name Placement date Placement time Site Days   Peripheral IV 01/29/23 20 G Right Antecubital 01/29/23  1706  Antecubital  1  Intake/Output Last 24 hours  Intake/Output Summary (Last 24 hours) at 01/30/2023 0844 Last data filed at 01/30/2023 0744 Gross per 24 hour  Intake 100 ml  Output --  Net 100 ml    Labs/Imaging Results for orders placed or performed during the hospital encounter of 01/29/23 (from the past 48 hour(s))  CBC     Status: Abnormal   Collection Time: 01/29/23  2:47 PM  Result Value Ref Range   WBC 6.0 4.0 - 10.5 K/uL   RBC 4.77 3.87 - 5.11 MIL/uL   Hemoglobin 13.4 12.0 - 15.0 g/dL   HCT 59.5 63.8 - 75.6 %   MCV 88.9 80.0 - 100.0 fL   MCH 28.1 26.0 - 34.0 pg   MCHC 31.6 30.0 - 36.0 g/dL   RDW 43.3 (H) 29.5 - 18.8 %   Platelets 344 150 - 400 K/uL   nRBC 0.0 0.0 - 0.2 %    Comment: Performed at [facility]  Lab, [address] [place], [place] [address]  Brain natriuretic peptide     Status: Abnormal   Collection Time: 01/29/23  2:47 PM  Result Value Ref Range   B Natriuretic Peptide 136.2 (H) 0.0 - 100.0 pg/mL    Comment: Performed at [facility], [address], [place], [place] [address]  Basic metabolic panel     Status: Abnormal   Collection Time: 01/29/23  6:26 PM  Result Value Ref Range   Sodium QUANTITY NOT SUFFICIENT, UNABLE TO PERFORM TEST 135 - 145 mmol/L    Comment: CALLED TO M GOWENS,PA 1900 01/29/2023 WBOND   Potassium QUANTITY NOT SUFFICIENT, UNABLE TO PERFORM TEST 3.5 - 5.1 mmol/L   Chloride QUANTITY NOT SUFFICIENT, UNABLE TO PERFORM TEST 98 - 111 mmol/L   CO2 QUANTITY NOT SUFFICIENT, UNABLE TO PERFORM TEST 22 - 32 mmol/L   Glucose, Bld 145 (H) 70 - 99 mg/dL    Comment: Glucose reference range applies only to samples taken after fasting for at least 8 hours.   BUN QUANTITY NOT SUFFICIENT, UNABLE TO PERFORM TEST 8 - 23 mg/dL   Creatinine, Ser 0.10 0.44 - 1.00 mg/dL   Calcium QUANTITY NOT SUFFICIENT, UNABLE TO PERFORM TEST 8.9 - 10.3 mg/dL   GFR, Estimated >93 >23 mL/min    Comment: (NOTE) Calculated using the CKD-EPI Creatinine Equation (2021)    Anion gap NOT CALCULATED 5 - 15    Comment: Performed at [facility], [address] [place], [place] [address]  Basic metabolic panel     Status: Abnormal   Collection Time: 01/29/23  7:04 PM  Result Value Ref Range   Sodium 136 135 - 145 mmol/L   Potassium 4.3 3.5 - 5.1 mmol/L   Chloride 99 98 - 111 mmol/L   CO2 26 22 - 32 mmol/L   Glucose, Bld 150 (H) 70 - 99 mg/dL    Comment: Glucose reference range applies only to samples taken after fasting for at least 8 hours.   BUN 7 (L) 8 - 23 mg/dL   Creatinine, Ser 2.02 0.44 - 1.00 mg/dL   Calcium 8.7 (L) 8.9 - 10.3 mg/dL   GFR, Estimated >54 >27 mL/min    Comment: (NOTE) Calculated using the CKD-EPI Creatinine Equation (2021)    Anion gap 11 5 - 15    Comment:  Performed at [facility], [address] [place], [place] [address]  Basic metabolic panel     Status: Abnormal   Collection Time: 01/30/23  4:21 AM  Result Value Ref Range   Sodium 139 135 - 145 mmol/L   Potassium 3.4 (L) 3.5 - 5.1 mmol/L   Chloride 100 98 - 111 mmol/L   CO2 27 22 - 32 mmol/L   Glucose, Bld 152 (H) 70 - 99 mg/dL    Comment: Glucose reference range applies only to samples taken after fasting for at least 8 hours.   BUN 7 (L) 8 - 23 mg/dL   Creatinine, Ser 0.98 0.44 - 1.00 mg/dL   Calcium 9.1 8.9 - 11.9 mg/dL   GFR, Estimated >14 >78 mL/min    Comment: (NOTE) Calculated using the CKD-EPI Creatinine Equation (2021)    Anion gap 12 5 - 15    Comment: Performed at [facility], [address] [place], [place] [address]  CBC     Status: Abnormal   Collection Time: 01/30/23  4:21 AM  Result Value Ref Range   WBC 6.6 4.0 - 10.5 K/uL   RBC 4.84 3.87 - 5.11 MIL/uL   Hemoglobin 13.7 12.0 - 15.0 g/dL   HCT 13.0 86.5 - 78.4 %   MCV 88.2 80.0 - 100.0 fL   MCH 28.3 26.0 - 34.0 pg   MCHC 32.1 30.0 - 36.0 g/dL   RDW 69.6 (H) 29.5 - 28.4 %   Platelets 390 150 - 400 K/uL   nRBC 0.0 0.0 - 0.2 %    Comment: Performed at [facility], [address] [place], [place] [address]  Magnesium     Status: Abnormal   Collection Time: 01/30/23  4:21 AM  Result Value Ref Range   Magnesium 1.5 (L) 1.7 - 2.4 mg/dL    Comment: Performed at [facility], [address], [place], [place] [address]   DG Chest 2 View  Result Date: 01/29/2023 CLINICAL DATA:  Shortness of breath with weight gain over the last couple days. EXAM: CHEST - 2 VIEW COMPARISON:  Radiographs 09/04/2022 and 02/25/2022.  CT 07/17/2022. FINDINGS: Stable cardiomegaly, aortic atherosclerosis and chronic vascular congestion. Possible minimally increased interstitial edema compared with the prior study. No confluent airspace disease, pneumothorax or significant pleural effusion. Stable mild degenerative  changes in the spine. IMPRESSION: Cardiomegaly with chronic vascular congestion and possible minimally increased interstitial edema. Electronically Signed   By: Carey Bullocks M.D.   On: 01/29/2023 15:26    Pending Labs Unresulted Labs (From admission, onward)    None       Vitals/Pain Today's Vitals   01/30/23 0304 01/30/23 0500 01/30/23 0648 01/30/23 0815  BP: (!) 149/87 (!) 160/76  105/88  Pulse: 62 (!) 51  (!) 59  Resp: 15 19  17   Temp: 97.7 F (36.5 C)  98.3 F (36.8 C)   TempSrc: Oral  Oral   SpO2: 98% 97%  96%  Weight:      Height:      PainSc:        Isolation Precautions No active isolations  Medications Medications  acetaminophen (TYLENOL) tablet 1,000 mg (has no administration in time range)  aspirin EC tablet 81 mg (81 mg Oral Given 01/29/23 2228)  atorvastatin (LIPITOR) tablet 80 mg (has no administration in time range)  ezetimibe (ZETIA) tablet 10 mg (has no administration in time range)  metoprolol succinate (TOPROL-XL) 24 hr tablet 25 mg (has no administration in time range)  nitroGLYCERIN (NITROSTAT) SL tablet 0.4 mg (has no administration in time range)  octreotide (SANDOSTATIN) injection 100 mcg (100 mcg Subcutaneous Given  01/29/23 2304)  pantoprazole (PROTONIX) EC tablet 40 mg (40 mg Oral Given 01/29/23 2228)  ferrous sulfate tablet 325 mg (has no administration in time range)  ipratropium-albuterol (DUONEB) 0.5-2.5 (3) MG/3ML nebulizer solution 3 mL (has no administration in time range)  fluticasone furoate-vilanterol (BREO ELLIPTA) 100-25 MCG/ACT 1 puff (0 puffs Inhalation Hold 01/30/23 0737)    And  umeclidinium bromide (INCRUSE ELLIPTA) 62.5 MCG/ACT 1 puff (0 puffs Inhalation Hold 01/30/23 0738)  enoxaparin (LOVENOX) injection 55 mg (has no administration in time range)  sacubitril-valsartan (ENTRESTO) 24-26 mg per tablet (1 tablet Oral Given 01/29/23 1940)  furosemide (LASIX) injection 60 mg (60 mg Intravenous Given 01/29/23 1957)  magnesium sulfate IVPB 1 g  100 mL (0 g Intravenous Stopped 01/30/23 0744)  potassium chloride (KLOR-CON) packet 40 mEq (40 mEq Oral Given 01/30/23 0600)    Mobility walks with device     Focused Assessments Cardiac Assessment Handoff:  Cardiac Rhythm: Normal sinus rhythm Lab Results  Component Value Date   CKTOTAL 32 (L) 09/04/2022   CKMB 2.2 01/11/2011   TROPONINI <0.30 05/12/2012   Lab Results  Component Value Date   DDIMER 0.66 (H) 01/10/2011   Does the Patient currently have chest pain? No    R Recommendations: See Admitting Provider Note  Report given to:   Additional Notes: pt is able to independently use commode.

## 2023-01-31 ENCOUNTER — Other Ambulatory Visit (HOSPITAL_COMMUNITY): Payer: Self-pay

## 2023-01-31 DIAGNOSIS — I509 Heart failure, unspecified: Secondary | ICD-10-CM | POA: Diagnosis not present

## 2023-01-31 DIAGNOSIS — R600 Localized edema: Secondary | ICD-10-CM | POA: Diagnosis not present

## 2023-01-31 DIAGNOSIS — R0902 Hypoxemia: Secondary | ICD-10-CM | POA: Diagnosis not present

## 2023-01-31 MED ORDER — MAGNESIUM SULFATE 2 GM/50ML IV SOLN
2.0000 g | Freq: Once | INTRAVENOUS | Status: AC
  Administered 2023-01-31: 2 g via INTRAVENOUS
  Filled 2023-01-31: qty 50

## 2023-01-31 MED ORDER — POTASSIUM CHLORIDE CRYS ER 20 MEQ PO TBCR
40.0000 meq | EXTENDED_RELEASE_TABLET | Freq: Once | ORAL | Status: AC
  Administered 2023-01-31: 40 meq via ORAL
  Filled 2023-01-31: qty 2

## 2023-01-31 MED ORDER — FUROSEMIDE 40 MG PO TABS
40.0000 mg | ORAL_TABLET | Freq: Two times a day (BID) | ORAL | Status: DC
  Administered 2023-01-31: 40 mg via ORAL
  Filled 2023-01-31: qty 1

## 2023-01-31 MED ORDER — ENOXAPARIN SODIUM 60 MG/0.6ML IJ SOSY
50.0000 mg | PREFILLED_SYRINGE | INTRAMUSCULAR | Status: DC
  Administered 2023-01-31: 50 mg via SUBCUTANEOUS
  Filled 2023-01-31: qty 0.6

## 2023-01-31 MED ORDER — FUROSEMIDE 40 MG PO TABS
40.0000 mg | ORAL_TABLET | Freq: Two times a day (BID) | ORAL | 0 refills | Status: AC
Start: 2023-01-31 — End: 2023-04-01
  Filled 2023-01-31 (×2): qty 60, 30d supply, fill #0

## 2023-01-31 NOTE — Assessment & Plan Note (Signed)
Patient's constellation of symptoms including orthopnea, dyspnea with exertion, bilateral lower extremity edema and checks x-ray finding of cardiomegaly with vascular congestion is strongly suggestive of CHF exacerbation in the setting of noncompliance to home Lasix .  Unclear of patient's dry weight however she has reported over 30 pound weight gain in the last month.  She is s/p 60 mg IV Lasix with good urine output.  Will continue diuresis with close monitoring of kidney function and electrolytes. Presenting weight of 242 lbs >>> 229 lbs current,  -Admit to med telemetry, Dr. Jennette Kettle attending -S/p IV Lasix 60 mg x2  -Daily weight -Strict ins and outs - Potassium 3.4, repleted lytes  - Continue Entresto and spironolactone  - Restart lasix 40 mg BID

## 2023-01-31 NOTE — Progress Notes (Signed)
Pt has orders to be discharged. Discharge instructions given and pt has no additional questions at this time. Medication regimen reviewed and pt educated. Pt verbalized understanding and has no additional questions. Telemetry box removed. IV removed and site in good condition. Pt stable and waiting for transportation. 

## 2023-01-31 NOTE — Assessment & Plan Note (Signed)
 Mild bradycardia in the 50s.  This seems to be a chronic issue for patient and could be attributed to her beta-blocker.  Currently asymptomatic. -Continuous cardiac monitoring.

## 2023-01-31 NOTE — Discharge Summary (Addendum)
Family Medicine Teaching [facility] Discharge Summary  Patient name: [name] Medical record number: [id number] Date of birth: 09-Feb-1951 Age: 72 y.o. Gender: female Date of Admission: 01/29/2023  Date of Discharge: 8/9/20240 Admitting Physician: Nestor Ramp, MD  Primary Care Provider: Elayne Guerin, MD Consultants: None  Indication for Hospitalization: CHF exacerbation   Brief Hospital Course:  [name] is a 72 year old female patient with past medical history of hypertension, congestive heart failure, hyperlipidemia, type 2 diabetes, peripheral artery disease, COPD who was admitted to the [facility] Medicine Teaching Service for worsening dyspnea secondary to congestive heart failure exacerbation.   CHF exacerbation Presented with 1 week of worsening dyspnea and lower extremity swelling with history of not Lasix nonadherence for the last month.  Suspected dry weight of 202 pounds, presented at 242 pounds.  BNP elevation 137 although not greatly above questionable baseline. CXR did show cardiomegaly with moderate vascular congestion and minimal interstitial edema.  Patient responded briskly to the IV Lasix and was discharged on home regimen of Lasix 40 mg twice daily.  Chronic Medical Problems:  COPD Breo Ellipta, Incruse Ellipta, DuoNebs as needed HTN Entresto 24-26 mg 2 times daily Toprol XL 25 mg daily and Aldactone 50 mg daily HLD/PAD/CAD: Lipitor 80 mg daily, Zetia 10 mg daily, Anemia: Octreotide, ferrous sulfate 325 mg daily  GERD:  Protonix 40 mg twice per day  Follow-up recommendations at discharge;  Follow-up with cardiologist about hospitalization due to heart failure in 1 to 2 weeks  Discharge Diagnoses/Problem List:   Principal Problem:   CHF exacerbation (HCC) Active Problems:   COPD (chronic obstructive pulmonary disease) (HCC)   Hypoxia   Peripheral edema   Disposition: Home   Discharge Condition: Stable and Improving   Discharge Exam:    Physical Exam Constitutional:      Appearance: She is well-developed. She is obese.  HENT:     Head: Normocephalic.  Eyes:     Extraocular Movements: Extraocular movements intact.     Pupils: Pupils are equal, round, and reactive to light.  Cardiovascular:     Rate and Rhythm: Bradycardia present.     Heart sounds: No murmur heard.     Bilateral lower extremity edema 1+  Pulmonary: Normal work of breathing, on room air     Breath sounds: No wheezing.     Comments: Decreased sounds, possibly due to habitus  Abdominal:     Tenderness: There is no abdominal tenderness.  Musculoskeletal:        General: Normal range of motion.  Skin:    General: Skin is warm.  Neurological:     General: No focal deficit present.     Mental Status: She is alert and oriented to person, place, and time.     Cranial Nerves: No cranial nerve deficit.     Sensory: No sensory deficit.     Motor: No weakness.    Significant Procedures: None  Significant Labs and Imaging:  Recent Labs  Lab 01/29/23 1447 01/30/23 0421  WBC 6.0 6.6  HGB 13.4 13.7  HCT 42.4 42.7  PLT 344 390   Recent Labs  Lab 01/29/23 1826 01/29/23 1904 01/30/23 0421 01/31/23 0132  NA QUANTITY NOT SUFFICIENT, UNABLE TO PERFORM TEST 136 139 136  K QUANTITY NOT SUFFICIENT, UNABLE TO PERFORM TEST 4.3 3.4* 3.6  CL QUANTITY NOT SUFFICIENT, UNABLE TO PERFORM TEST 99 100 96*  CO2 QUANTITY NOT SUFFICIENT, UNABLE TO PERFORM TEST 26 27 28   GLUCOSE 145* 150* 152*  143*  BUN QUANTITY NOT SUFFICIENT, UNABLE TO PERFORM TEST 7* 7* 12  CREATININE 0.82 0.87 0.80 0.90  CALCIUM QUANTITY NOT SUFFICIENT, UNABLE TO PERFORM TEST 8.7* 9.1 9.0  MG  --   --  1.5* 1.7   BNP 137    8/7 CXR:    Radiologist Impression: Cardiomegaly with chronic vascular congestion and possible minimally increased interstitial edema.  My interpretation: Chronic vascular congestion, some minimal interstitial edema     Results/Tests Pending at Time of  Discharge: None   Discharge Medications:  Allergies as of 01/31/2023   No Known Allergies      Medication List     STOP taking these medications    cefdinir 300 MG capsule Commonly known as: OMNICEF   cilostazol 100 MG tablet Commonly known as: PLETAL       TAKE these medications    acetaminophen 500 MG tablet Commonly known as: TYLENOL Take 1,000 mg by mouth as needed for moderate pain.   albuterol (2.5 MG/3ML) 0.083% nebulizer solution Commonly known as: PROVENTIL Take 2.5 mg by nebulization in the morning, at noon, and at bedtime.   albuterol 108 (90 Base) MCG/ACT inhaler Commonly known as: VENTOLIN HFA Inhale 1-2 puffs into the lungs every 6 (six) hours as needed for wheezing or shortness of breath.   aspirin EC 81 MG tablet Commonly known as: Aspirin Low Dose Take 1 tablet (81 mg total) by mouth daily. Resume after 1 week, on 09/13/2022 as long as you are not having black stools.   atorvastatin 80 MG tablet Commonly known as: LIPITOR Take 80 mg by mouth daily.   B-D 3CC LUER-LOK SYR 25GX5/8" 25G X 5/8" 3 ML Misc Generic drug: SYRINGE-NEEDLE (DISP) 3 ML Inject 1 Syringe into the skin 3 (three) times daily. For use with Octreodide injection.   Entresto 24-26 MG Generic drug: sacubitril-valsartan Take 1 tablet by mouth 2 (two) times daily.   ezetimibe 10 MG tablet Commonly known as: ZETIA TAKE 1 TABLET BY MOUTH DAILY   ferrous sulfate 325 (65 FE) MG tablet Take 325 mg by mouth daily.   fluticasone 50 MCG/ACT nasal spray Commonly known as: FLONASE Place 1 spray into both nostrils daily as needed for allergies.   furosemide 40 MG tablet Commonly known as: Lasix Take 1 tablet (40 mg total) by mouth 2 (two) times daily.   gabapentin 600 MG tablet Commonly known as: NEURONTIN Take 1 tablet (600 mg total) by mouth at bedtime. What changed: when to take this   ipratropium-albuterol 0.5-2.5 (3) MG/3ML Soln Commonly known as: DUONEB Take 3 mLs by  nebulization 3 (three) times daily. What changed:  when to take this reasons to take this   Linzess 145 MCG Caps capsule Generic drug: linaclotide Take 145 mcg by mouth daily as needed (Constipation).   methocarbamol 500 MG tablet Commonly known as: ROBAXIN Take 500 mg by mouth daily.   metoprolol succinate 25 MG 24 hr tablet Commonly known as: TOPROL-XL TAKE 1 TABLET(25 MG) BY MOUTH DAILY   nitroGLYCERIN 0.4 MG SL tablet Commonly known as: NITROSTAT Place 1 tablet (0.4 mg total) under the tongue every 5 (five) minutes as needed for chest pain.   octreotide 100 MCG/ML Soln injection Commonly known as: SANDOSTATIN Inject 1 mL (100 mcg total) into the skin 3 (three) times daily.   OXYGEN Inhale 3 L into the lungs continuous.   pantoprazole 40 MG tablet Commonly known as: PROTONIX Take 1 tablet (40 mg total) by mouth 2 (two) times  daily. What changed: when to take this   Potassium Chloride ER 20 MEQ Tbcr Take 20 mEq by mouth daily.   spironolactone 50 MG tablet Commonly known as: ALDACTONE Take 50 mg by mouth daily.   Trelegy Ellipta 100-62.5-25 MCG/ACT Aepb Generic drug: Fluticasone-Umeclidin-Vilant Inhale 1 puff into the lungs daily. What changed: how much to take   VITAMIN D-3 PO Take 1 capsule by mouth 2 (two) times daily.       Discharge Instructions: Please refer to Patient Instructions section of EMR for full details.  Patient was counseled important signs and symptoms that should prompt return to medical care, changes in medications, dietary instructions, activity restrictions, and follow up appointments.   Follow-Up Appointments: Follow-up with primary care provider 1-2 weeks after discharge   Follow-up Information     [name], [name], DO. Schedule an appointment as soon as possible for a visit in 1 week(s).   Specialties: Cardiology, Vascular Surgery Contact information: [address] [place] [address] [phone]                 Peterson Ao, MD 01/31/2023, 2:26 PM PGY-1, [facility] Psychiatry Resident   I was personally present and performed medical decision making activities of this service and have verified that the service and findings are accurately documented in the resident's note.  Shelby Mattocks, DO                  01/31/2023, 2:26 PM

## 2023-01-31 NOTE — Discharge Instructions (Addendum)
Dear [name],   Thank you for letting us participate in your care! In this section, you will find a brief hospital admission summary of why you were admitted to the hospital, what happened during your admission, your diagnosis/diagnoses, and recommended follow up.  Primary diagnosis: Congestive heart failure exacerbation Treatment plan: your heart function looked fine on echocardiogram. We gave you diuretics to get the fluid off. Please continue to take this at home. We have provided this medication to you prior to discharge.   POST-HOSPITAL & CARE INSTRUCTIONS We recommend following up with your PCP within 1 week from being discharged from the hospital. Please let PCP/Specialists know of any changes in medications that were made which you will be able to see in the medications section of this packet.  DOCTOR'S APPOINTMENTS & FOLLOW UP Future Appointments  Date Time Provider Department Center  03/18/2023 11:45 AM Tessa Lerner, DO [facility] None   Thank you for choosing [facility]! Take care and be well!  Family Medicine Teaching Service Inpatient Team [place]  [facility]  [address] [place], [place] [address] [phone]

## 2023-01-31 NOTE — Hospital Course (Addendum)
[name] is a 72 year old female patient with past medical history of hypertension, congestive heart failure, hyperlipidemia, type 2 diabetes, peripheral artery disease, COPD who was admitted to the [facility] Medicine Teaching Service for worsening dyspnea secondary to congestive heart failure exacerbation.   CHF exacerbation Presented with 1 week of worsening dyspnea and lower extremity swelling with history of not Lasix nonadherence for the last month.  Suspected dry weight of 202 pounds, presented at 242 pounds.  BNP elevation 137 although not greatly above questionable baseline. CXR did show cardiomegaly with moderate vascular congestion and minimal interstitial edema.  Patient responded briskly to the IV Lasix and was discharged on home regimen of Lasix 40 mg twice daily.  Chronic Medical Problems:  COPD Breo Ellipta, Incruse Ellipta, DuoNebs as needed HTN Entresto 24-26 mg 2 times daily Toprol XL 25 mg daily and Aldactone 50 mg daily HLD/PAD/CAD: Lipitor 80 mg daily, Zetia 10 mg daily, Anemia: Octreotide, ferrous sulfate 325 mg daily  GERD:  Protonix 40 mg twice per day  Follow-up recommendations at discharge;  Follow-up with cardiologist about hospitalization due to heart failure in 1 to 2 weeks

## 2023-01-31 NOTE — Plan of Care (Signed)

## 2023-01-31 NOTE — Assessment & Plan Note (Signed)
 Suspect patient's dyspnea is more likely in the setting of HF exacerbation versus COPD.  No wheezing on exam, and denies any productive cough.  Right leg have good work of breathing on RA with reassuring O2 sats.  Home  medication included as needed albuterol, DuoNeb and daily controller with Trelegy. -As needed albuterol -As needed DuoNeb -Daily Incruse  + Breo

## 2023-01-31 NOTE — TOC Transition Note (Addendum)
Transition of Care [facility]) - CM/SW Discharge Note   Patient Details  Name: [name] MRN: [id number] Date of Birth: [birth year]  Transition of Care [facility]) CM/SW Contact:  Leone Haven, RN Phone Number: 01/31/2023, 2:09 PM   Clinical Narrative:    For dc home today, her brother will transport her home per pateint. TOC to fill meds. Patient has scheduled her apt with her PCP for next Tues 8/13 at 2pm.    Final next level of care: Home/Self Care Barriers to Discharge: No Barriers Identified   Patient Goals and CMS Choice   Choice offered to / list presented to : NA  Discharge Placement                         Discharge Plan and Services Additional resources added to the After Visit Summary for   In-house Referral: NA Discharge Planning Services: CM Consult Post Acute Care Choice: NA            DME Agency: NA       HH Arranged: NA          Social Determinants of Health (SDOH) Interventions SDOH Screenings   Food Insecurity: No Food Insecurity (01/30/2023)  Housing: Low Risk  (01/30/2023)  Transportation Needs: No Transportation Needs (01/30/2023)  Utilities: Not At Risk (01/30/2023)  Depression (PHQ2-9): Low Risk  (01/10/2022)  Physical Activity: Insufficiently Active (03/15/2022)  Social Connections: Unknown (05/22/2022)   Received from [facility]  Stress: Stress Concern Present (03/15/2022)  Tobacco Use: Medium Risk (01/29/2023)     Readmission Risk Interventions    08/28/2021   10:31 AM  Readmission Risk Prevention Plan  Transportation Screening Complete  PCP or Specialist Appt within 3-5 Days Complete  HRI or Home Care Consult Complete  Social Work Consult for Recovery Care Planning/Counseling Complete  Palliative Care Screening Complete  Medication Review Oceanographer) Complete

## 2023-01-31 NOTE — TOC Initial Note (Signed)
Transition of Care [facility]) - Initial/Assessment Note    Patient Details  Name: [name] MRN: [id number] Date of Birth: [birth year]  Transition of Care [facility]) CM/SW Contact:    Leone Haven, RN Phone Number: 01/31/2023, 2:06 PM  Clinical Narrative:                 From home with daughter, she has PCP, Allene Dillon at [facility], patient states she will call and make her follow up apt.  She has insurance on file.  She has no HH services in place at this time, she does has home oxygen with [employer] to use as needed per patient.  She states her brother will transport her home at dc, her family is her support system. She gets her medications from [facility] on [address].  Pta self ambulatory.   Expected Discharge Plan: Home/Self Care Barriers to Discharge: No Barriers Identified   Patient Goals and CMS Choice Patient states their goals for this hospitalization and ongoing recovery are:: return home   Choice offered to / list presented to : NA      Expected Discharge Plan and Services In-house Referral: NA Discharge Planning Services: CM Consult Post Acute Care Choice: NA Living arrangements for the past 2 months: Single Family Home                   DME Agency: NA       HH Arranged: NA          Prior Living Arrangements/Services Living arrangements for the past 2 months: Single Family Home Lives with:: Adult Children (daughter) Patient language and need for interpreter reviewed:: Yes Do you feel safe going back to the place where you live?: Yes      Need for Family Participation in Patient Care: Yes (Comment) Care giver support system in place?: Yes (comment) Current home services: DME (home oxygen with [employer] as needed.) Criminal Activity/Legal Involvement Pertinent to Current Situation/Hospitalization: No - Comment as needed  Activities of Daily Living Home Assistive Devices/Equipment: Walker (specify type), Cane (specify  quad or straight), Hospital bed, Blood pressure cuff, Raised toilet seat with rails, Shower chair with back ADL Screening (condition at time of admission) Patient's cognitive ability adequate to safely complete daily activities?: Yes Is the patient deaf or have difficulty hearing?: No Does the patient have difficulty seeing, even when wearing glasses/contacts?: No Does the patient have difficulty concentrating, remembering, or making decisions?: No Patient able to express need for assistance with ADLs?: Yes Does the patient have difficulty dressing or bathing?: No Independently performs ADLs?: Yes (appropriate for developmental age) Does the patient have difficulty walking or climbing stairs?: No Weakness of Legs: None Weakness of Arms/Hands: None  Permission Sought/Granted                  Emotional Assessment   Attitude/Demeanor/Rapport: Engaged Affect (typically observed): Appropriate Orientation: : Oriented to Self, Oriented to Place, Oriented to  Time, Oriented to Situation Alcohol / Substance Use: Not Applicable Psych Involvement: No (comment)  Admission diagnosis:  Peripheral edema [R60.0] Hypoxia [R09.02] CHF exacerbation (HCC) [I50.9] Acute on chronic congestive heart failure, unspecified heart failure type [facility]) [I50.9] Patient Active Problem List   Diagnosis Date Noted   Hypoxia 01/30/2023   Peripheral edema 01/30/2023   CHF exacerbation (HCC) 01/29/2023   Symptomatic anemia 09/04/2022   Dyslipidemia 02/26/2022   Type 2 diabetes mellitus with peripheral neuropathy (HCC) 02/26/2022   COPD without  exacerbation (HCC) 02/26/2022   Acute on chronic respiratory failure with hypoxia (HCC) 02/26/2022   Acute hypoxemic respiratory failure (HCC) 02/26/2022   Chronic diastolic CHF (congestive heart failure) (HCC) 02/25/2022   Streptococcal pneumonia (HCC)    Streptococcal bacteremia    CAP (community acquired pneumonia) 11/29/2021   Sepsis (HCC) 11/29/2021   Acute  bacterial bronchitis 11/08/2021   GI bleed 11/08/2021   Elevated troponin level not due myocardial infarction 11/07/2021   Acute cardiogenic pulmonary edema (HCC) 11/07/2021   Chronic respiratory failure with hypoxia (HCC) 11/07/2021   Mixed diabetic hyperlipidemia associated with type 2 diabetes mellitus (HCC) 11/07/2021   Melena    Anemia due to chronic blood loss    AVM (arteriovenous malformation) of small bowel, acquired with hemorrhage    Acute on chronic blood loss anemia 09/21/2021   Thrombocytosis 09/21/2021   Type 2 diabetes mellitus without complication, without long-term current use of insulin (HCC) 09/21/2021   Obesity, Class III, BMI 40-49.9 (morbid obesity) (HCC) 08/27/2021   Acute upper gastrointestinal bleeding 05/09/2021   COPD (chronic obstructive pulmonary disease) (HCC)    Coronary artery disease    Osteoarthritis of left knee 04/18/2020   Tobacco use disorder 09/18/2018   Iron deficiency anemia 04/09/2018   Chronic anemia 12/26/2015   PAD (peripheral artery disease) (HCC) 10/05/2013   Discomfort in chest 05/11/2012   Nicotine dependence 05/11/2012   Bradycardia 05/11/2012   Essential hypertension 05/11/2012   Back pain 05/11/2012   PCP:  Elayne Guerin, MD Pharmacy:   [id number] [place], [place] - [address] AT [facility] [address] [place] [address] Phone: [phone] Fax: [phone]  [place] [facility] [address] [name] [address] Phone: [phone] Fax: [phone]  [facility] [address] [place] [address] Phone: [phone] Fax: [phone]     Social Determinants of Health (SDOH) Social History: SDOH Screenings   Food Insecurity: No Food Insecurity (01/30/2023)  Housing: Low Risk  (01/30/2023)  Transportation Needs: No Transportation Needs (01/30/2023)  Utilities: Not At Risk (01/30/2023)  Depression (PHQ2-9):  Low Risk  (01/10/2022)  Physical Activity: Insufficiently Active (03/15/2022)  Social Connections: Unknown (05/22/2022)   Received from [facility]  Stress: Stress Concern Present (03/15/2022)  Tobacco Use: Medium Risk (01/29/2023)   SDOH Interventions:     Readmission Risk Interventions    08/28/2021   10:31 AM  Readmission Risk Prevention Plan  Transportation Screening Complete  PCP or Specialist Appt within 3-5 Days Complete  HRI or Home Care Consult Complete  Social Work Consult for Recovery Care Planning/Counseling Complete  Palliative Care Screening Complete  Medication Review Oceanographer) Complete

## 2023-02-01 NOTE — Progress Notes (Signed)
  Progress Note   Date: 01/31/2023  Patient Name: [name]        MRN#: [id number]  Clarification of diagnosis:  Acute on chronic systolic CHF

## 2023-02-01 NOTE — Progress Notes (Signed)
  Progress Note   Date: 01/31/2023  Patient Name: [name]        MRN#: [id number]  Review the patient's clinical findings supports the diagnosis of:   Hypokalemia

## 2023-02-01 NOTE — Progress Notes (Signed)
  Progress Note   Date: 01/31/2023  Patient Name: [name]        MRN#: [id number]  Review the patient's clinical findings supports the diagnosis of:   Hypomagnesemia

## 2023-02-05 ENCOUNTER — Other Ambulatory Visit: Payer: Self-pay | Admitting: Family Medicine

## 2023-02-05 DIAGNOSIS — Z1231 Encounter for screening mammogram for malignant neoplasm of breast: Secondary | ICD-10-CM

## 2023-02-07 ENCOUNTER — Encounter: Payer: Self-pay | Admitting: Cardiology

## 2023-02-11 NOTE — Progress Notes (Signed)
Called patient to inform her about the message above. Patient understood

## 2023-02-13 ENCOUNTER — Ambulatory Visit: Payer: Medicare HMO | Admitting: Cardiology

## 2023-02-13 ENCOUNTER — Encounter: Payer: Self-pay | Admitting: Cardiology

## 2023-02-13 VITALS — BP 121/69 | HR 55 | Resp 16 | Ht 59.0 in | Wt 231.0 lb

## 2023-02-13 DIAGNOSIS — I739 Peripheral vascular disease, unspecified: Secondary | ICD-10-CM

## 2023-02-13 DIAGNOSIS — E119 Type 2 diabetes mellitus without complications: Secondary | ICD-10-CM

## 2023-02-13 DIAGNOSIS — I6523 Occlusion and stenosis of bilateral carotid arteries: Secondary | ICD-10-CM

## 2023-02-13 DIAGNOSIS — I10 Essential (primary) hypertension: Secondary | ICD-10-CM

## 2023-02-13 DIAGNOSIS — I5032 Chronic diastolic (congestive) heart failure: Secondary | ICD-10-CM

## 2023-02-13 DIAGNOSIS — E782 Mixed hyperlipidemia: Secondary | ICD-10-CM

## 2023-02-13 DIAGNOSIS — I2510 Atherosclerotic heart disease of native coronary artery without angina pectoris: Secondary | ICD-10-CM

## 2023-02-13 DIAGNOSIS — Z87891 Personal history of nicotine dependence: Secondary | ICD-10-CM

## 2023-02-13 DIAGNOSIS — I455 Other specified heart block: Secondary | ICD-10-CM

## 2023-02-13 MED ORDER — METOPROLOL SUCCINATE ER 25 MG PO TB24: 25.0000 mg | ORAL_TABLET | Freq: Every morning | ORAL | 0 refills | Status: DC

## 2023-02-13 MED ORDER — SPIRONOLACTONE 25 MG PO TABS
25.0000 mg | ORAL_TABLET | Freq: Every morning | ORAL | 3 refills | Status: DC
Start: 2023-02-13 — End: 2023-10-16

## 2023-02-13 NOTE — Progress Notes (Signed)
[name] Date of Birth: January 26, 1951 MRN: [id number] Primary Care Provider:Torres, Vernona Rieger, MD Former Cardiology Providers: Carla Raemon, APRN, FNP-C Primary Cardiologist: Carla Lerner, DO, [facility] (established care 08/30/2019)  Date: 02/13/23 Last Office Visit: 09/12/2022  Chief Complaint  Patient presents with   Congestive Heart Failure   Hospitalization Follow-up    HPI  [name] is a 72 y.o.  female whose past medical history and cardiovascular risk factors include: Established coronary artery disease without angina pectoris, hypertension, hyperlipidemia, former smoker, peripheral artery disease status post PV angiogram with revascularization of the SFA, non-insulin-dependent diabetes, postmenopausal female, advanced age, obesity due to excess calories, history of prior GI bleed (most recent 08/2022 2 PRBCs and IV iron), COPD.   Patient being followed by the practice for CAD, HFpEF, carotid disease, PAD.  Patient was last seen in the office in March 2024 and since then has been hospitalized earlier this month in August 2024 for heart failure exacerbation as she ran out of her medications.  She was diuresed with parenteral medications and was later discharged.    In the interim, patient had a cardiac monitor ordered by her PCP at [facility] likely due to her underlying dyspnea according to the patient.  The results were forwarded to our office for further interpretation.  Independently reviewed her Zio patch results noted below.  However in summary underlying rhythm to be sinus.  Monitor also demonstrated 1 episode of NSVT which was short duration, auto triggered event.  Her PSVT burden is approximately 2.4%.  She also had pauses during the monitoring period.  These pauses were auto triggered events.  When asked patient states that she usually goes to sleep around midnight to 1:00 each day and wakes up by 10 AM.  She does not have any symptoms to suggest  symptomatic bradycardia.  She is also on Lopressor 50 mg p.o. twice daily   She has chronic dyspnea and chooses not to be on oxygen therapy which has been recommended by her other providers and the care team.  She uses oxygen on a as needed basis.  She has been evaluated for sleep apnea in the past but does not recall the results.  She is currently not on device therapy for sleep apnea.  History of symptomatic anemia with a hemoglobin of 6.6 back in March 2024 requiring blood transfusions and IV iron.  ALLERGIES: No Known Allergies  MEDICATION LIST PRIOR TO VISIT: Current Outpatient Medications on File Prior to Visit  Medication Sig Dispense Refill   acetaminophen (TYLENOL) 500 MG tablet Take 1,000 mg by mouth as needed for moderate pain.     albuterol (PROVENTIL) (2.5 MG/3ML) 0.083% nebulizer solution Take 2.5 mg by nebulization in the morning, at noon, and at bedtime.     albuterol (VENTOLIN HFA) 108 (90 Base) MCG/ACT inhaler Inhale 1-2 puffs into the lungs every 6 (six) hours as needed for wheezing or shortness of breath. 8.5 g 1   aspirin EC (ASPIRIN LOW DOSE) 81 MG tablet Take 1 tablet (81 mg total) by mouth daily. Resume after 1 week, on 09/13/2022 as long as you are not having black stools. 30 tablet 0   atorvastatin (LIPITOR) 80 MG tablet Take 80 mg by mouth daily.     Cholecalciferol (VITAMIN D-3 PO) Take 1 capsule by mouth 2 (two) times daily.     ezetimibe (ZETIA) 10 MG tablet TAKE 1 TABLET BY MOUTH DAILY (Patient taking differently: Take 10 mg by mouth daily.) 90 tablet  3   ferrous sulfate 325 (65 FE) MG tablet Take 325 mg by mouth daily.     fluticasone (FLONASE) 50 MCG/ACT nasal spray Place 1 spray into both nostrils daily as needed for allergies.     Fluticasone-Umeclidin-Vilant (TRELEGY ELLIPTA) 100-62.5-25 MCG/ACT AEPB Inhale 1 puff into the lungs daily. (Patient taking differently: Inhale 2 puffs into the lungs daily.) 1 each 5   furosemide (LASIX) 40 MG tablet Take 1 tablet  (40 mg total) by mouth 2 (two) times daily. 60 tablet 0   gabapentin (NEURONTIN) 600 MG tablet Take 1 tablet (600 mg total) by mouth at bedtime. (Patient taking differently: Take 600 mg by mouth 3 (three) times daily.) 30 tablet 11   octreotide (SANDOSTATIN) 100 MCG/ML SOLN injection Inject 1 mL (100 mcg total) into the skin 3 (three) times daily. 90 mL 2   OXYGEN Inhale 3 L into the lungs continuous.     pantoprazole (PROTONIX) 40 MG tablet Take 1 tablet (40 mg total) by mouth 2 (two) times daily. (Patient taking differently: Take 40 mg by mouth daily.) 60 tablet 0   Potassium Chloride ER 20 MEQ TBCR Take 20 mEq by mouth daily.     sacubitril-valsartan (ENTRESTO) 24-26 MG Take 1 tablet by mouth 2 (two) times daily. 60 tablet 2   SYRINGE-NEEDLE, DISP, 3 ML 25G X 5/8" 3 ML MISC Inject 1 Syringe into the skin 3 (three) times daily. For use with Octreodide injection. 100 each 2   nitroGLYCERIN (NITROSTAT) 0.4 MG SL tablet Place 1 tablet (0.4 mg total) under the tongue every 5 (five) minutes as needed for chest pain. 90 tablet 3   No current facility-administered medications on file prior to visit.    PAST MEDICAL HISTORY: Past Medical History:  Diagnosis Date   Anemia 12/26/2015   Anxiety    Arthritis    Blood transfusion without reported diagnosis    Chronic pain    resolved per pt 04/12/20   COPD (chronic obstructive pulmonary disease) (HCC)    Coronary artery disease    Depression    Diabetes mellitus without complication (HCC)    type 2   GERD (gastroesophageal reflux disease)    Heart murmur    since birth; Echo 02/18/19: LVEF 68%, grade 1 diastolic dysfunction, mild AS (mean grad 12 mmHg), trace MR/PR, mild TR, PASP 32 mmHg     Hyperlipemia    Hypertension    PVD (peripheral vascular disease) (HCC)    right SFA stent 02/11/17 by Dr. Jacinto Halim   Sleep apnea    does not use cpap   Wears glasses     PAST SURGICAL HISTORY: Past Surgical History:  Procedure Laterality Date    ABDOMINAL HYSTERECTOMY     CARDIAC CATHETERIZATION     CATARACT EXTRACTION     CESAREAN SECTION     COLONOSCOPY N/A 01/08/2013   Procedure: COLONOSCOPY;  Surgeon: Theda Belfast, MD;  Location: [facility] ENDOSCOPY;  Service: Endoscopy;  Laterality: N/A;   COLONOSCOPY N/A 12/28/2015   Procedure: COLONOSCOPY;  Surgeon: Jeani Hawking, MD;  Location: [facility] ENDOSCOPY;  Service: Endoscopy;  Laterality: N/A;   COLONOSCOPY WITH PROPOFOL N/A 10/05/2020   Procedure: COLONOSCOPY WITH PROPOFOL;  Surgeon: Jeani Hawking, MD;  Location: [facility] ENDOSCOPY;  Service: Endoscopy;  Laterality: N/A;   ENTEROSCOPY N/A 12/28/2015   Procedure: ENTEROSCOPY;  Surgeon: Jeani Hawking, MD;  Location: [facility] ENDOSCOPY;  Service: Endoscopy;  Laterality: N/A;   ENTEROSCOPY N/A 02/20/2018   Procedure: ENTEROSCOPY;  Surgeon: Jeani Hawking, MD;  Location: [facility] ENDOSCOPY;  Service: Endoscopy;  Laterality: N/A;   ENTEROSCOPY N/A 03/27/2018   Procedure: ENTEROSCOPY;  Surgeon: Jeani Hawking, MD;  Location: [facility] ENDOSCOPY;  Service: Endoscopy;  Laterality: N/A;   ENTEROSCOPY N/A 10/05/2020   Procedure: ENTEROSCOPY;  Surgeon: Jeani Hawking, MD;  Location: [facility] ENDOSCOPY;  Service: Endoscopy;  Laterality: N/A;   ENTEROSCOPY N/A 05/11/2021   Procedure: ENTEROSCOPY;  Surgeon: Jeani Hawking, MD;  Location: [facility] ENDOSCOPY;  Service: Endoscopy;  Laterality: N/A;   ENTEROSCOPY N/A 09/23/2021   Procedure: ENTEROSCOPY;  Surgeon: Sherrilyn Rist, MD;  Location: [facility] ENDOSCOPY;  Service: Gastroenterology;  Laterality: N/A;   ESOPHAGOGASTRODUODENOSCOPY N/A 01/08/2013   Procedure: ESOPHAGOGASTRODUODENOSCOPY (EGD);  Surgeon: Theda Belfast, MD;  Location: [facility] ENDOSCOPY;  Service: Endoscopy;  Laterality: N/A;   EYE SURGERY     GIVENS CAPSULE STUDY N/A 09/24/2021   Procedure: GIVENS CAPSULE STUDY;  Surgeon: Jeani Hawking, MD;  Location: [facility] ENDOSCOPY;  Service: Gastroenterology;  Laterality: N/A;   HAND SURGERY     HEMOSTASIS CLIP PLACEMENT  05/11/2021   Procedure: HEMOSTASIS CLIP  PLACEMENT;  Surgeon: Jeani Hawking, MD;  Location: [facility] ENDOSCOPY;  Service: Endoscopy;;   HOT HEMOSTASIS N/A 12/28/2015   Procedure: HOT HEMOSTASIS (ARGON PLASMA COAGULATION/BICAP);  Surgeon: Jeani Hawking, MD;  Location: [facility] ENDOSCOPY;  Service: Endoscopy;  Laterality: N/A;   HOT HEMOSTASIS N/A 02/20/2018   Procedure: HOT HEMOSTASIS (ARGON PLASMA COAGULATION/BICAP);  Surgeon: Jeani Hawking, MD;  Location: [facility] ENDOSCOPY;  Service: Endoscopy;  Laterality: N/A;   HOT HEMOSTASIS N/A 03/27/2018   Procedure: HOT HEMOSTASIS (ARGON PLASMA COAGULATION/BICAP);  Surgeon: Jeani Hawking, MD;  Location: [facility] ENDOSCOPY;  Service: Endoscopy;  Laterality: N/A;   HOT HEMOSTASIS N/A 10/05/2020   Procedure: HOT HEMOSTASIS (ARGON PLASMA COAGULATION/BICAP);  Surgeon: Jeani Hawking, MD;  Location: [facility] ENDOSCOPY;  Service: Endoscopy;  Laterality: N/A;   HOT HEMOSTASIS N/A 05/11/2021   Procedure: HOT HEMOSTASIS (ARGON PLASMA COAGULATION/BICAP);  Surgeon: Jeani Hawking, MD;  Location: [facility] ENDOSCOPY;  Service: Endoscopy;  Laterality: N/A;   LEFT HEART CATHETERIZATION WITH CORONARY ANGIOGRAM N/A 03/09/2013   Procedure: LEFT HEART CATHETERIZATION WITH CORONARY ANGIOGRAM;  Surgeon: Pamella Pert, MD;  Location[facility] CATH LAB;  Service: Cardiovascular;  Laterality: N/A;   LOWER EXTREMITY ANGIOGRAM N/A 12/29/2012   Procedure: LOWER EXTREMITY ANGIOGRAM;  Surgeon: Pamella Pert, MD;  Location: [facility] CATH LAB;  Service: Cardiovascular;  Laterality: N/A;   LOWER EXTREMITY ANGIOGRAM N/A 10/05/2013   Procedure: LOWER EXTREMITY ANGIOGRAM;  Surgeon: Pamella Pert, MD;  Location: [facility] CATH LAB;  Service: Cardiovascular;  Laterality: N/A;   LOWER EXTREMITY ANGIOGRAPHY N/A 02/11/2017   Procedure: Lower Extremity Angiography;  Surgeon: Yates Decamp, MD;  Location: [facility] INVASIVE CV LAB;  Service: Cardiovascular;  Laterality: N/A;   PERIPHERAL VASCULAR BALLOON ANGIOPLASTY  02/11/2017   Procedure: PERIPHERAL VASCULAR BALLOON ANGIOPLASTY;  Surgeon: Yates Decamp,  MD;  Location: [facility] INVASIVE CV LAB;  Service: Cardiovascular;;  Right SFA   PERIPHERAL VASCULAR INTERVENTION Right 02/11/2017   Procedure: PERIPHERAL VASCULAR INTERVENTION;  Surgeon: Yates Decamp, MD;  Location: [facility] INVASIVE CV LAB;  Service: Cardiovascular;  Laterality: Right;  Rt SFA   POLYPECTOMY  10/05/2020   Procedure: POLYPECTOMY;  Surgeon: Jeani Hawking, MD;  Location: [facility] ENDOSCOPY;  Service: Endoscopy;;   stent in right leg  12/29/2012   for blood clot, Dr. Nadara Eaton   TOTAL KNEE ARTHROPLASTY Left 04/18/2020   TOTAL KNEE ARTHROPLASTY Left 04/18/2020   Procedure: LEFT TOTAL KNEE ARTHROPLASTY;  Surgeon: Cammy Copa, MD;  Location: [facility] OR;  Service: Orthopedics;  Laterality: Left;   TYMPANOMASTOIDECTOMY Left 03/08/2014   Procedure: TYMPANOMASTOIDECTOMY LEFT;  Surgeon: Darletta Moll, MD;  Location: [facility];  Service: ENT;  Laterality: Left;   TYMPANOMASTOIDECTOMY  2015   UTERINE FIBROID SURGERY      FAMILY HISTORY: The patient's family history includes Diabetes in her brother, brother, and mother; Heart disease in her father and mother; Hypertension in her brother, brother, brother, brother, brother, father, mother, sister, and sister.   SOCIAL HISTORY:  The patient  reports that she quit smoking about 20 months ago. Her smoking use included cigarettes. She started smoking about 56 years ago. She has a 32.9 pack-year smoking history. She has never used smokeless tobacco. She reports current alcohol use. She reports that she does not currently use drugs after having used the following drugs: Marijuana and Cocaine.  Review of Systems  Constitutional: Negative for chills and fever.  HENT:  Negative for hoarse voice and nosebleeds.   Eyes:  Negative for discharge, double vision and pain.  Cardiovascular:  Positive for claudication (stable). Negative for chest pain, dyspnea on exertion, leg swelling, near-syncope, orthopnea, palpitations, paroxysmal nocturnal dyspnea and syncope.   Respiratory:  Negative for hemoptysis and shortness of breath.   Musculoskeletal:  Positive for arthritis and joint pain. Negative for muscle cramps and myalgias.  Gastrointestinal:  Negative for abdominal pain, constipation, diarrhea, hematemesis, hematochezia, melena, nausea and vomiting.  Neurological:  Negative for dizziness and light-headedness.    PHYSICAL EXAM:    02/13/2023    9:47 AM 01/31/2023   11:29 AM 01/31/2023    7:40 AM  Vitals with BMI  Height 4\' 11"     Weight 231 lbs    BMI 46.63    Systolic 121 121 409  Diastolic 69 63 52  Pulse 55 57 59    Physical Exam  Constitutional: No distress.  Age appropriate, hemodynamically stable.   Neck: No JVD present.  Cardiovascular: Regular rhythm, S1 normal, S2 normal and intact distal pulses. Bradycardia present. Exam reveals no gallop, no S3 and no S4.  No murmur heard. Pulses:      Femoral pulses are 1+ on the right side and 1+ on the left side.      Popliteal pulses are 0 on the right side and 0 on the left side.       Dorsalis pedis pulses are 2+ on the right side and 2+ on the left side.       Posterior tibial pulses are 0 on the right side and 0 on the left side.  Pulmonary/Chest: Effort normal and breath sounds normal. No stridor. She has no wheezes. She has no rales.  Abdominal: Soft. Bowel sounds are normal. She exhibits no distension. There is no abdominal tenderness.  Musculoskeletal:        General: No edema.     Cervical back: Neck supple.  Neurological: She is alert and oriented to person, place, and time. She has intact cranial nerves (2-12).  Skin: Skin is warm and moist.   CARDIAC DATABASE: EKG: 09/12/2022: Sinus bradycardia, 55 bpm, normal axis, without underlying ischemia or injury pattern. 02/13/2023: Sinus bradycardia, 58 bpm, without underlying ischemia injury pattern.  Echocardiogram: 11/08/2021: LVEF 55 to 60%, grade 1 diastolic dysfunction, right ventricular size is moderately dilated, and  moderately reduced in function, severe PASP, mild to moderate TR, ascending aorta 43 mm, estimated RAP 8 mmHg  01/30/2023:  1. Left ventricular ejection fraction, by estimation, is  60 to 65%. The left ventricle has normal function. The left ventricle has no regional wall motion abnormalities. There is severe left ventricular hypertrophy. Left ventricular diastolic parameters   are consistent with Grade I diastolic dysfunction (impaired relaxation).   2. Right ventricular systolic function is mildly reduced. The right ventricular size is mildly enlarged. There is normal pulmonary artery systolic pressure.   3. Right atrial size was mildly dilated.   4. The mitral valve is grossly normal. Trivial mitral valve regurgitation. No evidence of mitral stenosis.   5. The aortic valve is tricuspid. There is moderate calcification of the aortic valve. Aortic valve regurgitation is not visualized. Mild aortic valve stenosis. Aortic valve area, by VTI measures 1.94 cm. Aortic valve mean gradient measures 10.3 mmHg. Aortic valve Vmax measures 2.14 m/s.   6. Aortic dilatation noted. There is mild dilatation of the ascending aorta, measuring 42 mm.   7. The inferior vena cava is normal in size with greater than 50% respiratory variability, suggesting right atrial pressure of 3 mmHg.    Stress Testing:  Lexiscan Tetrofosmin stress test 04/07/2022: Lexiscan nuclear stress test performed using 1-day protocol. SPECT imaging showed very small, mild intensity, reversible apical perfusion defect. Normal wall motion and myocardial thickening. Stress LVEF 53%. Low risk study.   Heart Catheterization: Coronary angiogram 02/27/2014: Mid RCA 60-70% stenosis, mid LAD 60-70% stenosis. FFR to the LAD, lesion insignificant. Medical therapy for CAD. Moderate aortic valve calcification.  Carotid artery duplex 08/05/2022: Duplex suggests stenosis in the right internal carotid artery (50-69%).  < 50% stenosis in the right  external carotid artery. No evidence of significant stenosis in the left internal carotid artery.  <50% stenosis of the left external carotid artery. Antegrade left vertebral artery flow. Follow up in six months is appropriate if clinically indicated.   Peripheral arteriogram 02/11/2017: Successful PTA with DCB 5x150x2 InPact Admiral in the right proximal to distal SFA and stenting of proximal SFA with DES, Zilver 6x100 mm stent. 3 vessel r/o. Left mild disease and 2 vessel R/O. Mild disease left by angiogram 10/05/2013.   VAS US Aorta / IVC/Iliacs 08/17/2021 Calcifications noted throughout the abdominal aorta. There is no evidence of significant stenosis in the aortoiliac arteries, however, this is based on limited visualization.  ABI  08/17/2021 Right: Resting right ankle-brachial index is within normal range. No evidence of significant right lower extremity arterial disease. The right toe-brachial index is normal.   Left: Resting left ankle-brachial index is within normal range. No evidence of significant left lower extremity arterial disease. The left toe-brachial index is abnormal.  Lower extremity arterial duplex: 08/17/2021 Right: 50-74% stenosis noted in the deep femoral artery. Patent stent with no evidence of stenosis in the superficial femoral artery.  LABORATORY DATA:    Latest Ref Rng & Units 01/30/2023    4:21 AM 01/29/2023    2:47 PM 09/18/2022    1:27 PM  CBC  WBC 4.0 - 10.5 K/uL 6.6  6.0  9.7   Hemoglobin 12.0 - 15.0 g/dL 40.9  81.1  91.4   Hematocrit 36.0 - 46.0 % 42.7  42.4  40.0   Platelets 150 - 400 K/uL 390  344  489        Latest Ref Rng & Units 01/31/2023    1:32 AM 01/30/2023    4:21 AM 01/29/2023    7:04 PM  CMP  Glucose 70 - 99 mg/dL 782  956  213   BUN 8 - 23 mg/dL 12  7  7   Creatinine 0.44 - 1.00 mg/dL 1.61  0.96  0.45   Sodium 135 - 145 mmol/L 136  139  136   Potassium 3.5 - 5.1 mmol/L 3.6  3.4  4.3   Chloride 98 - 111 mmol/L 96  100  99   CO2 22 - 32  mmol/L 28  27  26    Calcium 8.9 - 10.3 mg/dL 9.0  9.1  8.7     Lipid Panel  Lab Results  Component Value Date   CHOL 138 03/26/2021   HDL 54 03/26/2021   LDLCALC 69 03/26/2021   LDLDIRECT 71 03/26/2021   TRIG 78 03/26/2021   CHOLHDL 4.5 05/12/2012     Lab Results  Component Value Date   HGBA1C 5.0 09/04/2022   HGBA1C 4.9 09/21/2021   HGBA1C 5.8 (H) 05/09/2021   No components found for: "NTPROBNP" Lab Results  Component Value Date   TSH 1.995 09/04/2022   TSH 2.703 06/20/2022   TSH 0.315 (L) 08/26/2021    Cardiac Panel (last 3 results) No results for input(s): "CKTOTAL", "CKMB", "TROPONINIHS", "RELINDX" in the last 72 hours.  IMPRESSION:    ICD-10-CM   1. Chronic heart failure with preserved ejection fraction (HFpEF) (HCC)  I50.32 EKG 12-Lead    spironolactone (ALDACTONE) 25 MG tablet    Basic metabolic panel    2. Sinus pause  I45.5 metoprolol succinate (TOPROL XL) 25 MG 24 hr tablet    Ambulatory referral to Sleep Studies    3. Atherosclerosis of native coronary artery of native heart without angina pectoris  I25.10     4. PAD (peripheral artery disease) (HCC)  I73.9     5. Carotid artery stenosis, asymptomatic, bilateral  I65.23 VAS US CAROTID    6. Essential hypertension  I10     7. Non-insulin dependent type 2 diabetes mellitus (HCC)  E11.9     8. Mixed hyperlipidemia  E78.2     9. Former smoker  Z87.891         RECOMMENDATIONS: [name] is a 72 y.o. female whose past medical history and cardiovascular risk factors include: Established coronary artery disease without angina pectoris, hypertension, hyperlipidemia, former smoker, peripheral artery disease status post PV angiogram with revascularization of the SFA, non-insulin-dependent diabetes, postmenopausal female, advanced age, obesity due to excess calories, history of prior GI bleed (most recent 08/2022 2 PRBCs and IV iron), COPD.   Chronic heart failure with preserved ejection  fraction (HFpEF) (HCC) Last hospitalization in August 2024 exacerbated by running out of medications. Stage C, NYHA class II. Medications reconciled. Currently on Lasix 40 mg p.o. twice daily -since she is euvolemic we will change it to once a day.  And add spironolactone 25 mg p.o. daily.  Labs in 1 week to reevaluate kidney function and potassium. Not on SGLT2 inhibitors due to history of urinary tract infections. Reemphasized the importance of ambulatory blood pressure/weight monitoring and to call us back sooner if her trend goes up by more than 1 pound over 24 hours or 3 pounds over a week.  Sinus pause Monitor performed by her PCP and noted sinus pauses. It appears that these episodes were nocturnal and she sleeps between midnight-1 AM until 10 AM in the morning. Other contributing factors or beta-blocker therapy currently on Lopressor 50 mg p.o. twice daily as well as questionable hypoxia if she does not use oxygen as prescribed. On the monitor she also has increased burden of PSVT which may predispose her to atrial fibrillation/flutter  and therefore there is still a role of beta-blocker therapy to control her ventricular rate.  Instead of being on Lopressor 50 mg p.o. twice daily will change it to Toprol-XL 25 mg p.o. daily. Will also refer to sleep medicine for evaluation for sleep apnea. I have asked her to use her oxygen as prescribed as she may also be having nocturnal hypoxia.  Atherosclerosis of native coronary artery of native heart without angina pectoris Denies anginal chest pain. EKG is nonischemic. Has undergone ischemic workup in the past results reviewed as part of medical decision making at today's visit  PAD (peripheral artery disease) (HCC) History of SFA stents. Overall degree of claudication remains stable. Reemphasized importance of exercise as tolerated. Continue aspirin, Zetia, atorvastatin Was on some physical which I suspect has been discontinued due to her  history of anemia  Carotid artery stenosis, asymptomatic, bilateral Asymptomatic. Bilateral ICA stenosis. Will repeat carotid duplex prior to the next office visit. Reemphasized the importance of secondary prevention with focus on improving her modifiable cardiovascular risk factors such as glycemic control, lipid management, blood pressure control, weight loss.  Essential hypertension Office blood pressures are well-controlled. Medication changes as noted above. Monitor for now  Total time spent: 50 minutes. Independently reviewed records since her recent hospitalization, echocardiogram report, outside Zio patch results independently reviewed and noted above, occasion changes as discussed above, management of at least 2 chronic comorbidities, changes in medications.  FINAL MEDICATION LIST END OF ENCOUNTER: Meds ordered this encounter  Medications   spironolactone (ALDACTONE) 25 MG tablet    Sig: Take 1 tablet (25 mg total) by mouth every morning.    Dispense:  90 tablet    Refill:  3   metoprolol succinate (TOPROL XL) 25 MG 24 hr tablet    Sig: Take 1 tablet (25 mg total) by mouth every morning.    Dispense:  90 tablet    Refill:  0      Current Outpatient Medications:    acetaminophen (TYLENOL) 500 MG tablet, Take 1,000 mg by mouth as needed for moderate pain., Disp: , Rfl:    albuterol (PROVENTIL) (2.5 MG/3ML) 0.083% nebulizer solution, Take 2.5 mg by nebulization in the morning, at noon, and at bedtime., Disp: , Rfl:    albuterol (VENTOLIN HFA) 108 (90 Base) MCG/ACT inhaler, Inhale 1-2 puffs into the lungs every 6 (six) hours as needed for wheezing or shortness of breath., Disp: 8.5 g, Rfl: 1   aspirin EC (ASPIRIN LOW DOSE) 81 MG tablet, Take 1 tablet (81 mg total) by mouth daily. Resume after 1 week, on 09/13/2022 as long as you are not having black stools., Disp: 30 tablet, Rfl: 0   atorvastatin (LIPITOR) 80 MG tablet, Take 80 mg by mouth daily., Disp: , Rfl:     Cholecalciferol (VITAMIN D-3 PO), Take 1 capsule by mouth 2 (two) times daily., Disp: , Rfl:    ezetimibe (ZETIA) 10 MG tablet, TAKE 1 TABLET BY MOUTH DAILY (Patient taking differently: Take 10 mg by mouth daily.), Disp: 90 tablet, Rfl: 3   ferrous sulfate 325 (65 FE) MG tablet, Take 325 mg by mouth daily., Disp: , Rfl:    fluticasone (FLONASE) 50 MCG/ACT nasal spray, Place 1 spray into both nostrils daily as needed for allergies., Disp: , Rfl:    Fluticasone-Umeclidin-Vilant (TRELEGY ELLIPTA) 100-62.5-25 MCG/ACT AEPB, Inhale 1 puff into the lungs daily. (Patient taking differently: Inhale 2 puffs into the lungs daily.), Disp: 1 each, Rfl: 5   furosemide (LASIX) 40 MG  tablet, Take 1 tablet (40 mg total) by mouth 2 (two) times daily., Disp: 60 tablet, Rfl: 0   gabapentin (NEURONTIN) 600 MG tablet, Take 1 tablet (600 mg total) by mouth at bedtime. (Patient taking differently: Take 600 mg by mouth 3 (three) times daily.), Disp: 30 tablet, Rfl: 11   metoprolol succinate (TOPROL XL) 25 MG 24 hr tablet, Take 1 tablet (25 mg total) by mouth every morning., Disp: 90 tablet, Rfl: 0   octreotide (SANDOSTATIN) 100 MCG/ML SOLN injection, Inject 1 mL (100 mcg total) into the skin 3 (three) times daily., Disp: 90 mL, Rfl: 2   OXYGEN, Inhale 3 L into the lungs continuous., Disp: , Rfl:    pantoprazole (PROTONIX) 40 MG tablet, Take 1 tablet (40 mg total) by mouth 2 (two) times daily. (Patient taking differently: Take 40 mg by mouth daily.), Disp: 60 tablet, Rfl: 0   Potassium Chloride ER 20 MEQ TBCR, Take 20 mEq by mouth daily., Disp: , Rfl:    sacubitril-valsartan (ENTRESTO) 24-26 MG, Take 1 tablet by mouth 2 (two) times daily., Disp: 60 tablet, Rfl: 2   spironolactone (ALDACTONE) 25 MG tablet, Take 1 tablet (25 mg total) by mouth every morning., Disp: 90 tablet, Rfl: 3   SYRINGE-NEEDLE, DISP, 3 ML 25G X 5/8" 3 ML MISC, Inject 1 Syringe into the skin 3 (three) times daily. For use with Octreodide injection., Disp:  100 each, Rfl: 2   nitroGLYCERIN (NITROSTAT) 0.4 MG SL tablet, Place 1 tablet (0.4 mg total) under the tongue every 5 (five) minutes as needed for chest pain., Disp: 90 tablet, Rfl: 3  Orders Placed This Encounter  Procedures   Basic metabolic panel   Ambulatory referral to Sleep Studies   EKG 12-Lead   VAS US CAROTID   --Continue cardiac medications as reconciled in final medication list. --Return in about 3 months (around 05/16/2023) for Follow up, heart failure management, bradycardia. Or sooner if needed. --Continue follow-up with your primary care physician regarding the management of your other chronic comorbid conditions.  Patient's questions and concerns were addressed to her satisfaction. She voices understanding of the instructions provided during this encounter.   This note was created using a voice recognition software as a result there may be grammatical errors inadvertently enclosed that do not reflect the nature of this encounter. Every attempt is made to correct such errors.  [name], [place], [facility]  Pager:  [phone] Office: [phone]

## 2023-02-17 ENCOUNTER — Ambulatory Visit (HOSPITAL_COMMUNITY)
Admission: RE | Admit: 2023-02-17 | Discharge: 2023-02-17 | Disposition: A | Discharge status: Home / Self Care | Payer: Medicare HMO | Source: Ambulatory Visit | Attending: Cardiology | Admitting: Cardiology

## 2023-02-17 DIAGNOSIS — I6523 Occlusion and stenosis of bilateral carotid arteries: Secondary | ICD-10-CM

## 2023-02-18 LAB — BASIC METABOLIC PANEL
CO2: 26 mmol/L (ref 20–29)
Calcium: 9.3 mg/dL (ref 8.7–10.3)
Glucose: 134 mg/dL — ABNORMAL HIGH (ref 70–99)
eGFR: 82 mL/min/{1.73_m2} (ref >59)

## 2023-02-18 LAB — PRO B NATRIURETIC PEPTIDE: NT-Pro BNP: 40 pg/mL (ref 0–301)

## 2023-02-21 NOTE — Progress Notes (Signed)
Called patient to inform her about her lab results. Patient understood

## 2023-02-21 NOTE — Progress Notes (Signed)
Called patient to inform her about her Vas [place] results. Patient understood

## 2023-02-26 NOTE — Telephone Encounter (Signed)
This encounter was created in error - please disregard.

## 2023-02-28 ENCOUNTER — Other Ambulatory Visit: Payer: Self-pay | Admitting: Cardiology

## 2023-02-28 DIAGNOSIS — I455 Other specified heart block: Secondary | ICD-10-CM

## 2023-03-01 ENCOUNTER — Telehealth: Payer: Self-pay | Admitting: Cardiology

## 2023-03-01 NOTE — Telephone Encounter (Signed)
Patient called around 7 AM today. She started having severe pain in left thing below, unable to stand up due to pain. She has h/o Lt SFA stenting w/Zilver DES in 2018. Of several differentials, acute limb ischemia would be one that would need urgent evaluation. I recommended the patient to call 911 and go to the nearest ER.   Elder Negus, MD Pager: [phone] Office: [phone]

## 2023-03-17 ENCOUNTER — Ambulatory Visit: Payer: Medicare HMO

## 2023-03-18 ENCOUNTER — Ambulatory Visit: Payer: Medicare HMO | Admitting: Cardiology

## 2023-05-16 ENCOUNTER — Other Ambulatory Visit (INDEPENDENT_AMBULATORY_CARE_PROVIDER_SITE_OTHER): Payer: Self-pay

## 2023-05-16 ENCOUNTER — Ambulatory Visit (INDEPENDENT_AMBULATORY_CARE_PROVIDER_SITE_OTHER): Payer: Medicare HMO | Admitting: Orthopedic Surgery

## 2023-05-16 ENCOUNTER — Ambulatory Visit: Payer: Self-pay | Admitting: Cardiology

## 2023-05-16 DIAGNOSIS — M79604 Pain in right leg: Secondary | ICD-10-CM

## 2023-05-16 DIAGNOSIS — M79605 Pain in left leg: Secondary | ICD-10-CM | POA: Diagnosis not present

## 2023-05-16 NOTE — Progress Notes (Unsigned)
Office Visit Note   Patient: [name]           Date of Birth: 28-Dec-1950           MRN: [id number] Visit Date: 05/16/2023 Requested by: Elayne Guerin, MD [address] [place],  [place] [address] PCP: Elayne Guerin, MD  Subjective: Chief Complaint  Patient presents with   Right Leg - Pain   Left Leg - Pain    HPI: [name] is a 72 y.o. female who presents to the office reporting bilateral leg pain.  She states that her "legs are not working".  She ambulates with a cane.  Reports some posterior ankle and calf pain that does go up both legs.  Does report some occasional numbness and tingling.  She states that over 6 weeks ago she just woke up with the pain.  Symptoms are worse in the morning.  Does have a history of diabetes.  Feels like it is an electric shock and tightness going up both legs.  She has tried gabapentin Tylenol and ibuprofen without help.  Symptoms are worse with walking.  She.                ROS: All systems reviewed are negative as they relate to the chief complaint within the history of present illness.  Patient denies fevers or chills.  Assessment & Plan: Visit Diagnoses:  1. Bilateral leg pain     Plan: Impression is bilateral leg pain with evidence of vertebral body deformity around T11 and T12.  She also has diminished pulses in bilateral lower extremities.  Question is neurogenic versus vascular claudication.  Symptoms ongoing for longer than 6 weeks.  Bilateral ABIs requested along with T-spine MRI to evaluate lower thoracic spine compression fractures.  Follow-up after those studies.  Follow-Up Instructions: No follow-ups on file.   Orders:  Orders Placed This Encounter  Procedures   XR Lumbar Spine 2-3 Views   MR Thoracic Spine w/o contrast   MR Lumbar Spine w/o contrast   VAS [place] ABI WITH/WO TBI   No orders of the defined types were placed in this encounter.     Procedures: No procedures performed   Clinical Data: No  additional findings.  Objective: Vital Signs: LMP  (LMP Unknown)   Physical Exam:  Constitutional: Patient appears well-developed HEENT:  Head: Normocephalic Eyes:EOM are normal Neck: Normal range of motion Cardiovascular: Normal rate Pulmonary/chest: Effort normal Neurologic: Patient is alert Skin: Skin is warm Psychiatric: Patient has normal mood and affect  Ortho Exam: Ortho exam demonstrates diminished pulses which are not palpable in bilateral lower extremities.  Both feet are warm.  No calf tenderness and negative Homans.  No definite paresthesias L1-S1 bilaterally.  Reflexes 1+ out of 4 bilateral patella and Achilles.  No knee effusions.  Both knees have good range of motion and there is no pain with internal rotation of either hip.  Patient does have a shortened stride length but no Trendelenburg gait.  Does have pain with forward and lateral bending.  No trochanteric tenderness is present.  No masses lymphadenopathy or skin changes noted in that back region.  Specialty Comments:  No specialty comments available.  Imaging: XR Lumbar Spine 2-3 Views  Result Date: 05/16/2023 AP lateral radiographs lumbar spine reviewed.  Mild T11-T12 compression fractures are present with about 10 to 20% vertebral body height loss.  Moderate degenerative changes present throughout the lumbar spine as well as in the facet joints.  No spondylolisthesis.  PMFS History: Patient Active Problem List   Diagnosis Date Noted   Hypoxia 01/30/2023   Peripheral edema 01/30/2023   CHF exacerbation (HCC) 01/29/2023   Symptomatic anemia 09/04/2022   Dyslipidemia 02/26/2022   Type 2 diabetes mellitus with peripheral neuropathy (HCC) 02/26/2022   COPD without exacerbation (HCC) 02/26/2022   Acute on chronic respiratory failure with hypoxia (HCC) 02/26/2022   Acute hypoxemic respiratory failure (HCC) 02/26/2022   Chronic diastolic CHF (congestive heart failure) (HCC) 02/25/2022   Streptococcal  pneumonia (HCC)    Streptococcal bacteremia    Sepsis (HCC) 11/29/2021   Acute bacterial bronchitis 11/08/2021   GI bleed 11/08/2021   Elevated troponin level not due myocardial infarction 11/07/2021   Acute cardiogenic pulmonary edema (HCC) 11/07/2021   Chronic respiratory failure with hypoxia (HCC) 11/07/2021   Mixed diabetic hyperlipidemia associated with type 2 diabetes mellitus (HCC) 11/07/2021   Melena    Anemia due to chronic blood loss    AVM (arteriovenous malformation) of small bowel, acquired with hemorrhage    Acute on chronic blood loss anemia 09/21/2021   Thrombocytosis 09/21/2021   Type 2 diabetes mellitus without complication, without long-term current use of insulin (HCC) 09/21/2021   Obesity, Class III, BMI 40-49.9 (morbid obesity) (HCC) 08/27/2021   Acute upper gastrointestinal bleeding 05/09/2021   COPD (chronic obstructive pulmonary disease) (HCC)    Coronary artery disease    Osteoarthritis of left knee 04/18/2020   Tobacco use disorder 09/18/2018   Iron deficiency anemia 04/09/2018   Chronic anemia 12/26/2015   PAD (peripheral artery disease) (HCC) 10/05/2013   Discomfort in chest 05/11/2012   Nicotine dependence 05/11/2012   Bradycardia 05/11/2012   Essential hypertension 05/11/2012   Back pain 05/11/2012   Past Medical History:  Diagnosis Date   Anemia 12/26/2015   Anxiety    Arthritis    Blood transfusion without reported diagnosis    Chronic pain    resolved per pt 04/12/20   COPD (chronic obstructive pulmonary disease) (HCC)    Coronary artery disease    Depression    Diabetes mellitus without complication (HCC)    type 2   GERD (gastroesophageal reflux disease)    Heart murmur    since birth; Echo 02/18/19: LVEF 68%, grade 1 diastolic dysfunction, mild AS (mean grad 12 mmHg), trace MR/PR, mild TR, PASP 32 mmHg     Hyperlipemia    Hypertension    PVD (peripheral vascular disease) (HCC)    right SFA stent 02/11/17 by Dr. Jacinto Halim   Sleep apnea     does not use cpap   Wears glasses     Family History  Problem Relation Age of Onset   Diabetes Mother    Hypertension Mother    Heart disease Mother    Heart disease Father    Hypertension Father    Hypertension Sister    Hypertension Brother    Diabetes Brother    Hypertension Sister    Hypertension Brother    Diabetes Brother    Hypertension Brother    Hypertension Brother    Hypertension Brother     Past Surgical History:  Procedure Laterality Date   ABDOMINAL HYSTERECTOMY     CARDIAC CATHETERIZATION     CATARACT EXTRACTION     CESAREAN SECTION     COLONOSCOPY N/A 01/08/2013   Procedure: COLONOSCOPY;  Surgeon: Theda Belfast, MD;  Location: [facility];  Service: Endoscopy;  Laterality: N/A;   COLONOSCOPY N/A 12/28/2015   Procedure: COLONOSCOPY;  Surgeon: Jeani Hawking,  MD;  Location: [facility] ENDOSCOPY;  Service: Endoscopy;  Laterality: N/A;   COLONOSCOPY WITH PROPOFOL N/A 10/05/2020   Procedure: COLONOSCOPY WITH PROPOFOL;  Surgeon: Jeani Hawking, MD;  Location: [facility] ENDOSCOPY;  Service: Endoscopy;  Laterality: N/A;   ENTEROSCOPY N/A 12/28/2015   Procedure: ENTEROSCOPY;  Surgeon: Jeani Hawking, MD;  Location: [facility] ENDOSCOPY;  Service: Endoscopy;  Laterality: N/A;   ENTEROSCOPY N/A 02/20/2018   Procedure: ENTEROSCOPY;  Surgeon: Jeani Hawking, MD;  Location: [facility] ENDOSCOPY;  Service: Endoscopy;  Laterality: N/A;   ENTEROSCOPY N/A 03/27/2018   Procedure: ENTEROSCOPY;  Surgeon: Jeani Hawking, MD;  Location: [facility] ENDOSCOPY;  Service: Endoscopy;  Laterality: N/A;   ENTEROSCOPY N/A 10/05/2020   Procedure: ENTEROSCOPY;  Surgeon: Jeani Hawking, MD;  Location: [facility] ENDOSCOPY;  Service: Endoscopy;  Laterality: N/A;   ENTEROSCOPY N/A 05/11/2021   Procedure: ENTEROSCOPY;  Surgeon: Jeani Hawking, MD;  Location: [facility] ENDOSCOPY;  Service: Endoscopy;  Laterality: N/A;   ENTEROSCOPY N/A 09/23/2021   Procedure: ENTEROSCOPY;  Surgeon: Sherrilyn Rist, MD;  Location: [facility] ENDOSCOPY;  Service: Gastroenterology;   Laterality: N/A;   ESOPHAGOGASTRODUODENOSCOPY N/A 01/08/2013   Procedure: ESOPHAGOGASTRODUODENOSCOPY (EGD);  Surgeon: Theda Belfast, MD;  Location: [facility];  Service: Endoscopy;  Laterality: N/A;   EYE SURGERY     GIVENS CAPSULE STUDY N/A 09/24/2021   Procedure: GIVENS CAPSULE STUDY;  Surgeon: Jeani Hawking, MD;  Location: [facility] ENDOSCOPY;  Service: Gastroenterology;  Laterality: N/A;   HAND SURGERY     HEMOSTASIS CLIP PLACEMENT  05/11/2021   Procedure: HEMOSTASIS CLIP PLACEMENT;  Surgeon: Jeani Hawking, MD;  Location: [facility] ENDOSCOPY;  Service: Endoscopy;;   HOT HEMOSTASIS N/A 12/28/2015   Procedure: HOT HEMOSTASIS (ARGON PLASMA COAGULATION/BICAP);  Surgeon: Jeani Hawking, MD;  Location: [facility] ENDOSCOPY;  Service: Endoscopy;  Laterality: N/A;   HOT HEMOSTASIS N/A 02/20/2018   Procedure: HOT HEMOSTASIS (ARGON PLASMA COAGULATION/BICAP);  Surgeon: Jeani Hawking, MD;  Location: [facility] END[facility];  Service: Endoscopy;  Laterality: N/A;   HOT HEMOSTASIS N/A 03/27/2018   Procedure: HOT HEMOSTASIS (ARGON PLASMA COAGULATION/BICAP);  Surgeon: Jeani Hawking, MD;  Location: [facility] END[facility];  Service: Endoscopy;  Laterality: N/A;   HOT HEMOSTASIS N/A 10/05/2020   Procedure: HOT HEMOSTASIS (ARGON PLASMA COAGULATION/BICAP);  Surgeon: Jeani Hawking, MD;  Location: [facility] ENDOSCOPY;  Service: Endoscopy;  Laterality: N/A;   HOT HEMOSTASIS N/A 05/11/2021   Procedure: HOT HEMOSTASIS (ARGON PLASMA COAGULATION/BICAP);  Surgeon: Jeani Hawking, MD;  Location: [facility] ENDOSCOPY;  Service: Endoscopy;  Laterality: N/A;   LEFT HEART CATHETERIZATION WITH CORONARY ANGIOGRAM N/A 03/09/2013   Procedure: LEFT HEART CATHETERIZATION WITH CORONARY ANGIOGRAM;  Surgeon: Pamella Pert, MD;  Location: [facility] CATH LAB;  Service: Cardiovascular;  Laterality: N/A;   LOWER EXTREMITY ANGIOGRAM N/A 12/29/2012   Procedure: LOWER EXTREMITY ANGIOGRAM;  Surgeon: Pamella Pert, MD;  Location: [facility] CATH LAB;  Service: Cardiovascular;  Laterality: N/A;   LOWER EXTREMITY  ANGIOGRAM N/A 10/05/2013   Procedure: LOWER EXTREMITY ANGIOGRAM;  Surgeon: Pamella Pert, MD;  Location: [facility] CATH LAB;  Service: Cardiovascular;  Laterality: N/A;   LOWER EXTREMITY ANGIOGRAPHY N/A 02/11/2017   Procedure: Lower Extremity Angiography;  Surgeon: Yates Decamp, MD;  Location: [facility] INVASIVE CV LAB;  Service: Cardiovascular;  Laterality: N/A;   PERIPHERAL VASCULAR BALLOON ANGIOPLASTY  02/11/2017   Procedure: PERIPHERAL VASCULAR BALLOON ANGIOPLASTY;  Surgeon: Yates Decamp, MD;  Location: [facility] INVASIVE CV LAB;  Service: Cardiovascular;;  Right SFA   PERIPHERAL VASCULAR INTERVENTION Right 02/11/2017   Procedure: PERIPHERAL VASCULAR INTERVENTION;  Surgeon: Yates Decamp, MD;  Location: [facility] INVASIVE CV LAB;  Service: Cardiovascular;  Laterality: Right;  Rt SFA   POLYPECTOMY  10/05/2020   Procedure: POLYPECTOMY;  Surgeon: Jeani Hawking, MD;  Location: [facility] ENDOSCOPY;  Service: Endoscopy;;   stent in right leg  12/29/2012   for blood clot, Dr. Nadara Eaton   TOTAL KNEE ARTHROPLASTY Left 04/18/2020   TOTAL KNEE ARTHROPLASTY Left 04/18/2020   Procedure: LEFT TOTAL KNEE ARTHROPLASTY;  Surgeon: Cammy Copa, MD;  Location: [facility] OR;  Service: Orthopedics;  Laterality: Left;   TYMPANOMASTOIDECTOMY Left 03/08/2014   Procedure: TYMPANOMASTOIDECTOMY LEFT;  Surgeon: Darletta Moll, MD;  Location: [facility];  Service: ENT;  Laterality: Left;   TYMPANOMASTOIDECTOMY  2015   UTERINE FIBROID SURGERY     Social History   Occupational History   Not on file  Tobacco Use   Smoking status: Former    Current packs/day: 0.00    Average packs/day: 0.6 packs/day for 54.8 years (32.9 ttl pk-yrs)    Types: Cigarettes    Start date: 08/18/1966    Quit date: 05/25/2021    Years since quitting: 1.9   Smokeless tobacco: Never  Vaping Use   Vaping status: Never Used  Substance and Sexual Activity   Alcohol use: Yes    Comment: occasional   Drug use: Not Currently    Types: Marijuana, Cocaine    Comment: Last use  04/11/20   Sexual activity: Not Currently    Birth control/protection: Surgical    Comment: Hysterectomy

## 2023-05-17 ENCOUNTER — Encounter: Payer: Self-pay | Admitting: Orthopedic Surgery

## 2023-05-19 ENCOUNTER — Encounter: Payer: Self-pay | Admitting: Cardiology

## 2023-05-19 ENCOUNTER — Ambulatory Visit: Payer: Medicare HMO | Attending: Cardiology | Admitting: Cardiology

## 2023-05-19 VITALS — BP 120/64 | HR 51 | Ht 59.0 in | Wt 234.2 lb

## 2023-05-19 DIAGNOSIS — I5032 Chronic diastolic (congestive) heart failure: Secondary | ICD-10-CM | POA: Diagnosis not present

## 2023-05-19 DIAGNOSIS — I10 Essential (primary) hypertension: Secondary | ICD-10-CM | POA: Diagnosis not present

## 2023-05-19 DIAGNOSIS — Z87891 Personal history of nicotine dependence: Secondary | ICD-10-CM

## 2023-05-19 DIAGNOSIS — I739 Peripheral vascular disease, unspecified: Secondary | ICD-10-CM | POA: Diagnosis not present

## 2023-05-19 DIAGNOSIS — I2510 Atherosclerotic heart disease of native coronary artery without angina pectoris: Secondary | ICD-10-CM

## 2023-05-19 DIAGNOSIS — E119 Type 2 diabetes mellitus without complications: Secondary | ICD-10-CM

## 2023-05-19 DIAGNOSIS — I6523 Occlusion and stenosis of bilateral carotid arteries: Secondary | ICD-10-CM

## 2023-05-19 DIAGNOSIS — E782 Mixed hyperlipidemia: Secondary | ICD-10-CM

## 2023-05-19 DIAGNOSIS — G4730 Sleep apnea, unspecified: Secondary | ICD-10-CM

## 2023-05-19 NOTE — Progress Notes (Signed)
Cardiology Office Note:  .   Date:  05/19/2023  ID:  [name], DOB [birth year], MRN [id number] PCP:  Elayne Guerin, MD  Former Cardiology Providers: Altamese Simonton, APRN, FNP-C  [facility] Providers Cardiologist:  Tessa Lerner, DO , [facility] (established care 05/19/23) Electrophysiologist:  None  Click to update primary MD,subspecialty MD or APP then REFRESH:1}    Chief Complaint  Patient presents with   Follow-up    3 months follow-up    History of Present Illness: .   [name] is a 72 y.o.  female whose past medical history and cardiovascular risk factors includes: Established coronary artery disease without angina pectoris, hypertension, hyperlipidemia, former smoker, peripheral artery disease status post PV angiogram with revascularization of the SFA, non-insulin-dependent diabetes, postmenopausal female, advanced age, obesity due to excess calories, history of prior GI bleed (most recent 08/2022 2 PRBCs and IV iron), COPD.   He has been followed by the practice for CAD, HFpEF, carotid disease.  Patient presents today for a 81-month follow-up visit.  HFpEF: Last hospitalization August 2024 exacerbated by running out of her medications. At the last office visit reduce Lasix from 40 mg p.o. twice daily to 40 mg p.o. daily and starting her on spironolactone.  Also reduce the dose of AV nodal blocking agents.  She tolerated the medication change well and she remains euvolemic.  Carotid disease Since last office visit she also had a carotid duplex which notes improvement in overall disease burden.  Reemphasized importance of medical management and secondary prevention.  Clinically remains asymptomatic  PAD: Patient has undergone peripheral vascular interventions in the past.  She complains of bilateral lower extremity discomfort, predominantly infrapopliteal.  Now she pain that starts at the calcaneus and moves proximally and described as a sharp shooting  pain.  Likely pseudoclaudication.  Patient states that she has had a thorough orthopedic workup as well.  Review of prior workup notes resting ABIs as of February 2023 were within normal limits.  Lower extremity arterial duplex were performed due to continued symptoms which noted disease in the right deep femoral artery but patent stents in the right SFA.   Review of Systems: .   Review of Systems  Cardiovascular:  Positive for dyspnea on exertion (chronic and stable.). Negative for chest pain, claudication, irregular heartbeat, leg swelling, near-syncope, orthopnea, palpitations, paroxysmal nocturnal dyspnea and syncope.  Hematologic/Lymphatic: Negative for bleeding problem.  Musculoskeletal:        Leg pain  Neurological:  Positive for paresthesias.    Studies Reviewed:   EKG: 02/13/2023: Sinus bradycardia, 58 bpm, without underlying ischemia injury pattern.   Echocardiogram: 01/30/2023:  1. Left ventricular ejection fraction, by estimation, is 60 to 65%. The left ventricle has normal function. The left ventricle has no regional wall motion abnormalities. There is severe left ventricular hypertrophy. Left ventricular diastolic parameters   are consistent with Grade I diastolic dysfunction (impaired relaxation).   2. Right ventricular systolic function is mildly reduced. The right ventricular size is mildly enlarged. There is normal pulmonary artery systolic pressure.   3. Right atrial size was mildly dilated.   4. The mitral valve is grossly normal. Trivial mitral valve regurgitation. No evidence of mitral stenosis.   5. The aortic valve is tricuspid. There is moderate calcification of the aortic valve. Aortic valve regurgitation is not visualized. Mild aortic valve stenosis. Aortic valve area, by VTI measures 1.94 cm. Aortic valve mean gradient measures 10.3 mmHg. Aortic valve Vmax measures 2.14 m/s.  6. Aortic dilatation noted. There is mild dilatation of the ascending aorta, measuring  42 mm.   7. The inferior vena cava is normal in size with greater than 50% respiratory variability, suggesting right atrial pressure of 3 mmHg.    Stress Testing:  Lexiscan Tetrofosmin stress test 04/07/2022: Lexiscan nuclear stress test performed using 1-day protocol. SPECT imaging showed very small, mild intensity, reversible apical perfusion defect. Normal wall motion and myocardial thickening. Stress LVEF 53%. Low risk study.    Heart Catheterization: Coronary angiogram 02/27/2014: Mid RCA 60-70% stenosis, mid LAD 60-70% stenosis. FFR to the LAD, lesion insignificant. Medical therapy for CAD. Moderate aortic valve calcification.   Carotid artery duplex  08/05/2022: Duplex suggests stenosis in the right internal carotid artery (50-69%).  See report for additional details  August 2024: Right Carotid: Velocities in the right ICA are consistent with a 1-39% stenosis.  Left Carotid: Velocities in the left ICA are consistent with a 1-39% stenosis.   Peripheral arteriogram 02/11/2017: Successful PTA with DCB 5x150x2 InPact Admiral in the right proximal to distal SFA and stenting of proximal SFA with DES, [place] 6x100 mm stent. 3 vessel r/o. Left mild disease and 2 vessel R/O. Mild disease left by angiogram 10/05/2013.    VAS US Aorta / IVC/Iliacs 08/17/2021 Calcifications noted throughout the abdominal aorta. There is no evidence of significant stenosis in the aortoiliac arteries, however, this is based on limited visualization.   ABI  08/17/2021 Right: Resting right ankle-brachial index is within normal range. No evidence of significant right lower extremity arterial disease. The right toe-brachial index is normal.   Left: Resting left ankle-brachial index is within normal range. No evidence of significant left lower extremity arterial disease. The left toe-brachial index is abnormal.   Lower extremity arterial duplex: 08/17/2021 Right: 50-74% stenosis noted in the deep femoral artery.  Patent stent with no evidence of stenosis in the superficial femoral artery.  RADIOLOGY: N/A  Risk Assessment/Calculations:   NA   Labs:       Latest Ref Rng & Units 01/30/2023    4:21 AM 01/29/2023    2:47 PM 09/18/2022    1:27 PM  CBC  WBC 4.0 - 10.5 K/uL 6.6  6.0  9.7   Hemoglobin 12.0 - 15.0 g/dL 09.8  11.9  14.7   Hematocrit 36.0 - 46.0 % 42.7  42.4  40.0   Platelets 150 - 400 K/uL 390  344  489        Latest Ref Rng & Units 02/17/2023   11:44 AM 01/31/2023    1:32 AM 01/30/2023    4:21 AM  BMP  Glucose 70 - 99 mg/dL 829  562  130   BUN 8 - 27 mg/dL 14  12  7    Creatinine 0.57 - 1.00 mg/dL 8.65  7.84  6.96   BUN/Creat Ratio 12 - 28 18     Sodium 134 - 144 mmol/L 141  136  139   Potassium 3.5 - 5.2 mmol/L 4.8  3.6  3.4   Chloride 96 - 106 mmol/L 102  96  100   CO2 20 - 29 mmol/L 26  28  27    Calcium 8.7 - 10.3 mg/dL 9.3  9.0  9.1       Latest Ref Rng & Units 02/17/2023   11:44 AM 01/31/2023    1:32 AM 01/30/2023    4:21 AM  CMP  Glucose 70 - 99 mg/dL 295  284  132   BUN 8 -  27 mg/dL 14  12  7    Creatinine 0.57 - 1.00 mg/dL 1.61  0.96  0.45   Sodium 134 - 144 mmol/L 141  136  139   Potassium 3.5 - 5.2 mmol/L 4.8  3.6  3.4   Chloride 96 - 106 mmol/L 102  96  100   CO2 20 - 29 mmol/L 26  28  27    Calcium 8.7 - 10.3 mg/dL 9.3  9.0  9.1     Lab Results  Component Value Date   CHOL 138 03/26/2021   HDL 54 03/26/2021   LDLCALC 69 03/26/2021   LDLDIRECT 71 03/26/2021   TRIG 78 03/26/2021   CHOLHDL 4.5 05/12/2012   No results for input(s): "LIPOA" in the last 8760 hours. No components found for: "NTPROBNP" Recent Labs    02/17/23 1132  PROBNP 40   Recent Labs    06/20/22 1008 09/04/22 2055  TSH 2.703 1.995    Physical Exam:    Today's Vitals   05/19/23 0927  BP: 120/64  Pulse: (!) 51  SpO2: 91%  Weight: 234 lb 3.2 oz (106.2 kg)  Height: 4\' 11"  (1.499 m)   Body mass index is 47.3 kg/m. Wt Readings from Last 3 Encounters:  05/19/23 234 lb 3.2 oz  (106.2 kg)  02/13/23 231 lb (104.8 kg)  01/31/23 229 lb 14.4 oz (104.3 kg)    Physical Exam  Constitutional: No distress.  Age appropriate, hemodynamically stable.   Neck: No JVD present.  Cardiovascular: Regular rhythm, S1 normal, S2 normal and intact distal pulses. Bradycardia present. Exam reveals no gallop, no S3 and no S4.  No murmur heard. Pulses:      Femoral pulses are 1+ on the right side and 1+ on the left side.      Popliteal pulses are 0 on the right side and 0 on the left side.       Dorsalis pedis pulses are 2+ on the right side and 2+ on the left side.       Posterior tibial pulses are 0 on the right side and 0 on the left side.  Pulmonary/Chest: Effort normal and breath sounds normal. No stridor. She has no wheezes. She has no rales.  Abdominal: Soft. Bowel sounds are normal. She exhibits no distension. There is no abdominal tenderness.  Musculoskeletal:        General: No edema.     Cervical back: Neck supple.  Neurological: She is alert and oriented to person, place, and time. She has intact cranial nerves (2-12).  Skin: Skin is warm and moist.     Impression & Recommendation(s):  Impression:   ICD-10-CM   1. Chronic heart failure with preserved ejection fraction (HFpEF) (HCC)  I50.32     2. Atherosclerosis of native coronary artery of native heart without angina pectoris  I25.10     3. PAD (peripheral artery disease) (HCC)  I73.9 Ambulatory referral to Vascular Surgery    4. Essential hypertension  I10     5. Atherosclerosis of both carotid arteries  I65.23     6. Non-insulin dependent type 2 diabetes mellitus (HCC)  E11.9     7. Mixed hyperlipidemia  E78.2     8. Former smoker  Z87.891     9. Sleep apnea in adult  G47.30        Recommendation(s):  Chronic heart failure with preserved ejection fraction (HFpEF) (HCC) Stage C, NYHA class II Echo August 2024: LVEF 60 to 65%, grade 1 diastolic  dysfunction. Has done well with reducing the dose of  Lasix and reinitiating spironolactone. Continue Entresto 24/26 mg p.o. twice daily. Continue spironolactone 25 mg p.o. daily. Continue potassium replacement 20 mEq p.o. nightly. Continue Toprol-XL 25 mg p.o. daily. Continue Lasix 40 mg p.o. daily. Patient did get evaluated for sleep apnea since last office visit.  She is congratulated on her efforts.  Patient states that she will pick up her CPAP later this week.  Reemphasized importance of compliance. Will continue to monitor her clinically.  Atherosclerosis of native coronary artery of native heart without angina pectoris Denies anginal factors since last office visit. No use of sublingual nitroglycerin tablets since the last office visit. Reemphasized the importance of secondary prevention with focus on improving her modifiable cardiovascular risk factors such as glycemic control, lipid management, blood pressure control, weight loss.  PAD (peripheral artery disease) (HCC) Has undergone peripheral vascular interventions in the past. Due to complaints of leg pain she has undergone repeat vascular studies. ABIs resting were noted to be within normal limits back in 2023 and a lower extremity arterial duplex noted disease in the right deep femoral artery with patent stents in the SFA. The pain she complains of today is predominantly radicular/pseudoclaudication. She continues to have repeat concerns/symptoms and requests additional PAD evaluation.  We discussed referring her back to Dr. Jacinto Halim or vascular surgery.  Patient prefers the latter.  Essential hypertension Office blood pressures are well-controlled. Medications as noted above  Atherosclerosis of both carotid arteries Right ICA disease improved compared to the past. Continue antiplatelets and statin therapy. Reemphasized the importance of secondary prevention with focus on improving her modifiable cardiovascular risk factors such as glycemic control, lipid management, blood  pressure control, weight loss.  Non-insulin dependent type 2 diabetes mellitus (HCC) Reemphasized the importance of glycemic control. Most recent hemoglobin A1c as of March 2024 is 5.0, per [facility] database  Mixed hyperlipidemia Continue lipid-lowering agents. Patient will have her lipids rechecked with PCP.  Requested her to send [place] a copy for reference  Orders Placed:  Orders Placed This Encounter  Procedures   Ambulatory referral to Vascular Surgery    Referral Priority:   Routine    Referral Type:   Surgical    Referral Reason:   Specialty Services Required    Requested Specialty:   Vascular Surgery    Number of Visits Requested:   1   Final Medication List:   No orders of the defined types were placed in this encounter.   There are no discontinued medications.   Current Outpatient Medications:    acetaminophen (TYLENOL) 500 MG tablet, Take 1,000 mg by mouth as needed for moderate pain., Disp: , Rfl:    albuterol (PROVENTIL) (2.5 MG/3ML) 0.083% nebulizer solution, Take 2.5 mg by nebulization in the morning, at noon, and at bedtime., Disp: , Rfl:    albuterol (VENTOLIN HFA) 108 (90 Base) MCG/ACT inhaler, Inhale 1-2 puffs into the lungs every 6 (six) hours as needed for wheezing or shortness of breath., Disp: 8.5 g, Rfl: 1   aspirin EC (ASPIRIN LOW DOSE) 81 MG tablet, Take 1 tablet (81 mg total) by mouth daily. Resume after 1 week, on 09/13/2022 as long as you are not having black stools., Disp: 30 tablet, Rfl: 0   atorvastatin (LIPITOR) 80 MG tablet, Take 80 mg by mouth daily., Disp: , Rfl:    Cholecalciferol (VITAMIN D-3 PO), Take 1 capsule by mouth 2 (two) times daily., Disp: , Rfl:    ezetimibe (ZETIA)  10 MG tablet, TAKE 1 TABLET BY MOUTH DAILY (Patient taking differently: Take 10 mg by mouth daily.), Disp: 90 tablet, Rfl: 3   ferrous sulfate 325 (65 FE) MG tablet, Take 325 mg by mouth daily., Disp: , Rfl:    fluticasone (FLONASE) 50 MCG/ACT nasal spray, Place 1 spray into both  nostrils daily as needed for allergies., Disp: , Rfl:    Fluticasone-Umeclidin-Vilant (TRELEGY ELLIPTA) 100-62.5-25 MCG/ACT AEPB, Inhale 1 puff into the lungs daily. (Patient taking differently: Inhale 2 puffs into the lungs daily.), Disp: 1 each, Rfl: 5   gabapentin (NEURONTIN) 600 MG tablet, Take 1 tablet (600 mg total) by mouth at bedtime. (Patient taking differently: Take 600 mg by mouth 3 (three) times daily.), Disp: 30 tablet, Rfl: 11   metoprolol succinate (TOPROL-XL) 25 MG 24 hr tablet, TAKE 1 TABLET BY MOUTH EVERY DAY IN THE MORNING, Disp: 90 tablet, Rfl: 0   octreotide (SANDOSTATIN) 100 MCG/ML SOLN injection, Inject 1 mL (100 mcg total) into the skin 3 (three) times daily., Disp: 90 mL, Rfl: 2   OXYGEN, Inhale 3 L into the lungs continuous., Disp: , Rfl:    pantoprazole (PROTONIX) 40 MG tablet, Take 1 tablet (40 mg total) by mouth 2 (two) times daily. (Patient taking differently: Take 40 mg by mouth daily.), Disp: 60 tablet, Rfl: 0   Potassium Chloride ER 20 MEQ TBCR, Take 20 mEq by mouth daily., Disp: , Rfl:    sacubitril-valsartan (ENTRESTO) 24-26 MG, Take 1 tablet by mouth 2 (two) times daily., Disp: 60 tablet, Rfl: 2   SYRINGE-NEEDLE, DISP, 3 ML 25G X 5/8" 3 ML MISC, Inject 1 Syringe into the skin 3 (three) times daily. For use with Octreodide injection., Disp: 100 each, Rfl: 2   furosemide (LASIX) 40 MG tablet, Take 1 tablet (40 mg total) by mouth 2 (two) times daily. (Patient taking differently: Take 40 mg by mouth daily.), Disp: 60 tablet, Rfl: 0   nitroGLYCERIN (NITROSTAT) 0.4 MG SL tablet, Place 1 tablet (0.4 mg total) under the tongue every 5 (five) minutes as needed for chest pain., Disp: 90 tablet, Rfl: 3   spironolactone (ALDACTONE) 25 MG tablet, Take 1 tablet (25 mg total) by mouth every morning., Disp: 90 tablet, Rfl: 3  Consent:   NA  Disposition:   6 months sooner if needed Patient may be asked to follow-up sooner based on the results of the above-mentioned  testing.  Her questions and concerns were addressed to her satisfaction. She voices understanding of the recommendations provided during this encounter.    Signed, Tessa Lerner, DO, [facility]  [facility]  [address] [place], [place] [address] 05/19/2023 6:25 PM

## 2023-05-19 NOTE — Patient Instructions (Signed)
Medication Instructions:  Your physician recommends that you continue on your current medications as directed. Please refer to the Current Medication list given to you today.  *If you need a refill on your cardiac medications before your next appointment, please call your pharmacy*  Lab Work: None ordered today. If you have labs (blood work) drawn today and your tests are completely normal, you will receive your results only by: MyChart Message (if you have MyChart) OR A paper copy in the mail If you have any lab test that is abnormal or we need to change your treatment, we will call you to review the results.  Testing/Procedures: None ordered today.  Follow-Up: At [facility], you and your health needs are our priority.  As part of our continuing mission to provide you with exceptional heart care, we have created designated Provider Care Teams.  These Care Teams include your primary Cardiologist (physician) and Advanced Practice Providers (APPs -  Physician Assistants and Nurse Practitioners) who all work together to provide you with the care you need, when you need it.  You have been referred to vascular surgery for management of peripheral arterial disease (PAD). They will reach out to you to schedule an appointment.   Your next appointment:   6 month(s)  The format for your next appointment:   In Person  Provider:   Tessa Lerner, DO {

## 2023-05-20 ENCOUNTER — Ambulatory Visit
Admission: RE | Admit: 2023-05-20 | Discharge: 2023-05-20 | Disposition: A | Discharge status: Home / Self Care | Payer: Medicare HMO | Source: Ambulatory Visit | Attending: Family Medicine | Admitting: Family Medicine

## 2023-05-20 DIAGNOSIS — Z1231 Encounter for screening mammogram for malignant neoplasm of breast: Secondary | ICD-10-CM

## 2023-06-05 ENCOUNTER — Encounter: Payer: Self-pay | Admitting: Orthopedic Surgery

## 2023-06-09 ENCOUNTER — Other Ambulatory Visit: Payer: Self-pay | Admitting: Acute Care

## 2023-06-09 DIAGNOSIS — Z122 Encounter for screening for malignant neoplasm of respiratory organs: Secondary | ICD-10-CM

## 2023-06-09 DIAGNOSIS — Z87891 Personal history of nicotine dependence: Secondary | ICD-10-CM

## 2023-06-11 ENCOUNTER — Ambulatory Visit
Admission: RE | Admit: 2023-06-11 | Discharge: 2023-06-11 | Disposition: A | Discharge status: Home / Self Care | Payer: Medicare HMO | Source: Ambulatory Visit | Attending: Orthopedic Surgery | Admitting: Orthopedic Surgery

## 2023-06-11 DIAGNOSIS — M79605 Pain in left leg: Secondary | ICD-10-CM

## 2023-06-19 ENCOUNTER — Other Ambulatory Visit: Payer: Self-pay | Admitting: Cardiology

## 2023-06-19 DIAGNOSIS — I455 Other specified heart block: Secondary | ICD-10-CM

## 2023-07-04 ENCOUNTER — Ambulatory Visit (INDEPENDENT_AMBULATORY_CARE_PROVIDER_SITE_OTHER): Payer: Medicare HMO | Admitting: Vascular Surgery

## 2023-07-04 ENCOUNTER — Encounter: Payer: Self-pay | Admitting: Vascular Surgery

## 2023-07-04 ENCOUNTER — Ambulatory Visit (INDEPENDENT_AMBULATORY_CARE_PROVIDER_SITE_OTHER): Payer: Medicare HMO | Admitting: Surgical

## 2023-07-04 ENCOUNTER — Ambulatory Visit (HOSPITAL_COMMUNITY)
Admission: RE | Admit: 2023-07-04 | Discharge: 2023-07-04 | Disposition: A | Discharge status: Home / Self Care | Payer: Medicare HMO | Source: Ambulatory Visit | Attending: Vascular Surgery | Admitting: Vascular Surgery

## 2023-07-04 ENCOUNTER — Encounter: Payer: Self-pay | Admitting: Orthopedic Surgery

## 2023-07-04 VITALS — BP 107/72 | HR 50 | Temp 97.3°F | Resp 20 | Ht 59.0 in | Wt 234.3 lb

## 2023-07-04 DIAGNOSIS — I739 Peripheral vascular disease, unspecified: Secondary | ICD-10-CM

## 2023-07-04 DIAGNOSIS — M79604 Pain in right leg: Secondary | ICD-10-CM

## 2023-07-04 DIAGNOSIS — M79605 Pain in left leg: Secondary | ICD-10-CM | POA: Diagnosis not present

## 2023-07-04 DIAGNOSIS — I6523 Occlusion and stenosis of bilateral carotid arteries: Secondary | ICD-10-CM

## 2023-07-04 LAB — VAS US ABI WITH/WO TBI
Left ABI: 0.98
Right ABI: 0.92

## 2023-07-04 NOTE — Progress Notes (Signed)
 Patient ID: [name], female   DOB: [birth year], 73 y.o.   MRN: [id number]  Reason for Consult: New Patient (Initial Visit)   Referred by Michele Richardson, DO  Subjective:     HPI  [name] is a 73 y.o. female presenting for evaluation of PAD.  She underwent right SFA stenting with Dr. Ladona in 2018 for claudication.  Today she reports she is having back, hip, knee and heel pain.  She denies true vasculogenic claudication, she denies rest pain.  She denies ulceration or nonhealing wounds.  She is a former smoker and quit in 2022  Past Medical History:  Diagnosis Date   Anemia 12/26/2015   Anxiety    Arthritis    Blood transfusion without reported diagnosis    CHF (congestive heart failure) (HCC)    Chronic pain    resolved per pt 04/12/20   COPD (chronic obstructive pulmonary disease) (HCC)    Coronary artery disease    Depression    Diabetes mellitus without complication (HCC)    type 2   GERD (gastroesophageal reflux disease)    Heart murmur    since birth; Echo 02/18/19: LVEF 68%, grade 1 diastolic dysfunction, mild AS (mean grad 12 mmHg), trace MR/PR, mild TR, PASP 32 mmHg     Hyperlipemia    Hypertension    PVD (peripheral vascular disease) (HCC)    right SFA stent 02/11/17 by Dr. Ladona   Sleep apnea    does not use cpap   Wears glasses    Family History  Problem Relation Age of Onset   Diabetes Mother    Hypertension Mother    Heart disease Mother    Heart disease Father    Hypertension Father    Hypertension Sister    Hypertension Brother    Diabetes Brother    Hypertension Sister    Hypertension Brother    Diabetes Brother    Hypertension Brother    Hypertension Brother    Hypertension Brother    Past Surgical History:  Procedure Laterality Date   ABDOMINAL HYSTERECTOMY     CARDIAC CATHETERIZATION     CATARACT EXTRACTION     CESAREAN SECTION     COLONOSCOPY N/A 01/08/2013   Procedure: COLONOSCOPY;  Surgeon: Belvie JONETTA Just, MD;   Location: [facility];  Service: Endoscopy;  Laterality: N/A;   COLONOSCOPY N/A 12/28/2015   Procedure: COLONOSCOPY;  Surgeon: Belvie Just, MD;  Location: [facility] ENDOSCOPY;  Service: Endoscopy;  Laterality: N/A;   COLONOSCOPY WITH PROPOFOL  N/A 10/05/2020   Procedure: COLONOSCOPY WITH PROPOFOL ;  Surgeon: Just Belvie, MD;  Location: [facility];  Service: Endoscopy;  Laterality: N/A;   ENTEROSCOPY N/A 12/28/2015   Procedure: ENTEROSCOPY;  Surgeon: Belvie Just, MD;  Location: [facility] ENDOSCOPY;  Service: Endoscopy;  Laterality: N/A;   ENTEROSCOPY N/A 02/20/2018   Procedure: ENTEROSCOPY;  Surgeon: Just Belvie, MD;  Location: [facility];  Service: Endoscopy;  Laterality: N/A;   ENTEROSCOPY N/A 03/27/2018   Procedure: ENTEROSCOPY;  Surgeon: Just Belvie, MD;  Location: [facility];  Service: Endoscopy;  Laterality: N/A;   ENTEROSCOPY N/A 10/05/2020   Procedure: ENTEROSCOPY;  Surgeon: Just Belvie, MD;  Location: [facility];  Service: Endoscopy;  Laterality: N/A;   ENTEROSCOPY N/A 05/11/2021   Procedure: ENTEROSCOPY;  Surgeon: Just Belvie, MD;  Location: [facility];  Service: Endoscopy;  Laterality: N/A;   ENTEROSCOPY N/A 09/23/2021   Procedure: ENTEROSCOPY;  Surgeon: Legrand Victory LITTIE DOUGLAS, MD;  Location: [facility] ENDOSCOPY;  Service: Gastroenterology;  Laterality: N/A;   ESOPHAGOGASTRODUODENOSCOPY N/A 01/08/2013   Procedure: ESOPHAGOGASTRODUODENOSCOPY (EGD);  Surgeon: Belvie JONETTA Just, MD;  Location: [facility];  Service: Endoscopy;  Laterality: N/A;   EYE SURGERY     GIVENS CAPSULE STUDY N/A 09/24/2021   Procedure: GIVENS CAPSULE STUDY;  Surgeon: Just Belvie, MD;  Location: [facility] ENDOSCOPY;  Service: Gastroenterology;  Laterality: N/A;   HAND SURGERY     HEMOSTASIS CLIP PLACEMENT  05/11/2021   Procedure: HEMOSTASIS CLIP PLACEMENT;  Surgeon: Just Belvie, MD;  Location: [facility];  Service: Endoscopy;;   HOT HEMOSTASIS N/A 12/28/2015   Procedure: HOT HEMOSTASIS (ARGON PLASMA COAGULATION/BICAP);  Surgeon:  Belvie Just, MD;  Location: [facility] ENDOSCOPY;  Service: Endoscopy;  Laterality: N/A;   HOT HEMOSTASIS N/A 02/20/2018   Procedure: HOT HEMOSTASIS (ARGON PLASMA COAGULATION/BICAP);  Surgeon: Just Belvie, MD;  Location: [facility];  Service: Endoscopy;  Laterality: N/A;   HOT HEMOSTASIS N/A 03/27/2018   Procedure: HOT HEMOSTASIS (ARGON PLASMA COAGULATION/BICAP);  Surgeon: Just Belvie, MD;  Location: [facility];  Service: Endoscopy;  Laterality: N/A;   HOT HEMOSTASIS N/A 10/05/2020   Procedure: HOT HEMOSTASIS (ARGON PLASMA COAGULATION/BICAP);  Surgeon: Just Belvie, MD;  Location: [facility];  Service: Endoscopy;  Laterality: N/A;   HOT HEMOSTASIS N/A 05/11/2021   Procedure: HOT HEMOSTASIS (ARGON PLASMA COAGULATION/BICAP);  Surgeon: Just Belvie, MD;  Location: [facility];  Service: Endoscopy;  Laterality: N/A;   LEFT HEART CATHETERIZATION WITH CORONARY ANGIOGRAM N/A 03/09/2013   Procedure: LEFT HEART CATHETERIZATION WITH CORONARY ANGIOGRAM;  Surgeon: Erick JONELLE Bergamo, MD;  Location: [facility] CATH LAB;  Service: Cardiovascular;  Laterality: N/A;   LOWER EXTREMITY ANGIOGRAM N/A 12/29/2012   Procedure: LOWER EXTREMITY ANGIOGRAM;  Surgeon: Erick JONELLE Bergamo, MD;  Location: [facility] CATH LAB;  Service: Cardiovascular;  Laterality: N/A;   LOWER EXTREMITY ANGIOGRAM N/A 10/05/2013   Procedure: LOWER EXTREMITY ANGIOGRAM;  Surgeon: Erick JONELLE Bergamo, MD;  Location: [facility] CATH LAB;  Service: Cardiovascular;  Laterality: N/A;   LOWER EXTREMITY ANGIOGRAPHY N/A 02/11/2017   Procedure: Lower Extremity Angiography;  Surgeon: Bergamo Heinz, MD;  Location: [facility] INVASIVE CV LAB;  Service: Cardiovascular;  Laterality: N/A;   PERIPHERAL VASCULAR BALLOON ANGIOPLASTY  02/11/2017   Procedure: PERIPHERAL VASCULAR BALLOON ANGIOPLASTY;  Surgeon: Bergamo Heinz, MD;  Location: [facility] INVASIVE CV LAB;  Service: Cardiovascular;;  Right SFA   PERIPHERAL VASCULAR INTERVENTION Right 02/11/2017   Procedure: PERIPHERAL VASCULAR INTERVENTION;  Surgeon: Bergamo Heinz, MD;  Location: [facility] INVASIVE CV LAB;  Service: Cardiovascular;  Laterality: Right;  Rt SFA   POLYPECTOMY  10/05/2020   Procedure: POLYPECTOMY;  Surgeon: Just Belvie, MD;  Location: [facility];  Service: Endoscopy;;   stent in right leg  12/29/2012   for blood clot, Dr. Margaretann   TOTAL KNEE ARTHROPLASTY Left 04/18/2020   TOTAL KNEE ARTHROPLASTY Left 04/18/2020   Procedure: LEFT TOTAL KNEE ARTHROPLASTY;  Surgeon: Addie Cordella Hamilton, MD;  Location: [facility] OR;  Service: Orthopedics;  Laterality: Left;   TYMPANOMASTOIDECTOMY Left 03/08/2014   Procedure: TYMPANOMASTOIDECTOMY LEFT;  Surgeon: Ana LELON Moccasin, MD;  Location: [facility];  Service: ENT;  Laterality: Left;   TYMPANOMASTOIDECTOMY  2015   UTERINE FIBROID SURGERY      Short Social History:  Social History   Tobacco Use   Smoking status: Former    Current packs/day: 0.00    Average packs/day: 0.6 packs/day for 54.8 years (32.9 ttl pk-yrs)    Types: Cigarettes    Start date: 08/18/1966    Quit date: 05/25/2021  Years since quitting: 2.1   Smokeless tobacco: Never  Substance Use Topics   Alcohol  use: Yes    Comment: occasional    No Known Allergies  Current Outpatient Medications  Medication Sig Dispense Refill   acetaminophen  (TYLENOL ) 500 MG tablet Take 1,000 mg by mouth as needed for moderate pain.     albuterol  (PROVENTIL ) (2.5 MG/3ML) 0.083% nebulizer solution Take 2.5 mg by nebulization in the morning, at noon, and at bedtime.     albuterol  (VENTOLIN  HFA) 108 (90 Base) MCG/ACT inhaler Inhale 1-2 puffs into the lungs every 6 (six) hours as needed for wheezing or shortness of breath. 8.5 g 1   aspirin  EC (ASPIRIN  LOW DOSE) 81 MG tablet Take 1 tablet (81 mg total) by mouth daily. Resume after 1 week, on 09/13/2022 as long as you are not having black stools. 30 tablet 0   atorvastatin  (LIPITOR ) 80 MG tablet Take 80 mg by mouth daily.     Cholecalciferol  (VITAMIN D -3 PO) Take 1 capsule by mouth 2 (two) times daily.      ezetimibe  (ZETIA ) 10 MG tablet TAKE 1 TABLET BY MOUTH DAILY (Patient taking differently: Take 10 mg by mouth daily.) 90 tablet 3   ferrous sulfate  325 (65 FE) MG tablet Take 325 mg by mouth daily.     fluticasone  (FLONASE ) 50 MCG/ACT nasal spray Place 1 spray into both nostrils daily as needed for allergies.     Fluticasone -Umeclidin-Vilant (TRELEGY ELLIPTA ) 100-62.5-25 MCG/ACT AEPB Inhale 1 puff into the lungs daily. (Patient taking differently: Inhale 2 puffs into the lungs daily.) 1 each 5   gabapentin  (NEURONTIN ) 600 MG tablet Take 1 tablet (600 mg total) by mouth at bedtime. (Patient taking differently: Take 600 mg by mouth 3 (three) times daily.) 30 tablet 11   metoprolol  succinate (TOPROL -XL) 25 MG 24 hr tablet TAKE 1 TABLET BY MOUTH EVERY DAY IN THE MORNING 90 tablet 3   octreotide  (SANDOSTATIN ) 100 MCG/ML SOLN injection Inject 1 mL (100 mcg total) into the skin 3 (three) times daily. 90 mL 2   OXYGEN  Inhale 3 L into the lungs continuous.     pantoprazole  (PROTONIX ) 40 MG tablet Take 1 tablet (40 mg total) by mouth 2 (two) times daily. (Patient taking differently: Take 40 mg by mouth daily.) 60 tablet 0   Potassium Chloride  ER 20 MEQ TBCR Take 20 mEq by mouth daily.     sacubitril -valsartan  (ENTRESTO ) 24-26 MG Take 1 tablet by mouth 2 (two) times daily. 60 tablet 2   SYRINGE-NEEDLE, DISP, 3 ML 25G X 5/8 3 ML MISC Inject 1 Syringe into the skin 3 (three) times daily. For use with Octreodide injection. 100 each 2   furosemide  (LASIX ) 40 MG tablet Take 1 tablet (40 mg total) by mouth 2 (two) times daily. (Patient taking differently: Take 40 mg by mouth daily.) 60 tablet 0   nitroGLYCERIN  (NITROSTAT ) 0.4 MG SL tablet Place 1 tablet (0.4 mg total) under the tongue every 5 (five) minutes as needed for chest pain. 90 tablet 3   spironolactone  (ALDACTONE ) 25 MG tablet Take 1 tablet (25 mg total) by mouth every morning. 90 tablet 3   No current facility-administered medications for this visit.     REVIEW OF SYSTEMS   All other systems were reviewed and are negative     Objective:  Objective   Vitals:   07/04/23 1111  BP: 107/72  Pulse: (!) 50  Resp: 20  Temp: (!) 97.3 F (36.3 C)  TempSrc: Temporal  SpO2: 90%  Weight: 234 lb 4.8 oz (106.3 kg)  Height: 4' 11 (1.499 m)   Body mass index is 47.32 kg/m.  Physical Exam General: no acute distress Cardiac: hemodynamically stable, nontachycardic Pulm: normal work of breathing Neuro: alert, no focal deficit Extremities: no edema, cyanosis or wounds  Data: ABI +---------+------------------+-----+--------+--------+  Right   Rt Pressure (mmHg)IndexWaveformComment   +---------+------------------+-----+--------+--------+  Brachial 131                                      +---------+------------------+-----+--------+--------+  PTA     121               0.92 biphasic          +---------+------------------+-----+--------+--------+  DP      100               0.76 biphasic          +---------+------------------+-----+--------+--------+  Great Toe79                0.60 Abnormal          +---------+------------------+-----+--------+--------+   +---------+------------------+-----+---------+-------+  Left    Lt Pressure (mmHg)IndexWaveform Comment  +---------+------------------+-----+---------+-------+  Brachial 123                                      +---------+------------------+-----+---------+-------+  PTA     128               0.98 triphasic         +---------+------------------+-----+---------+-------+  DP      98                0.75 biphasic          +---------+------------------+-----+---------+-------+  Great Toe88                0.67 Abnormal          +---------+------------------+-----+---------+-------+   Most recent BMP reviewed, creatinine 0.77  Echo reviewed, EF 60 to 65%, grade 1 diastolic dysfunction, normal RSVP, mild aortic stenosis valve  area 1.94   Right leg duplex from February 2023 reviewed Patent right SFA stent     Assessment/Plan:     Wilma Michaelson is a 73 y.o. female with multiple medical comorbidities presenting for lower extremity pain.  She has a history of right SFA stent placed by Dr. Ladona in 2018.  I explained that her pain seems joint related and not related to arterial perfusion.  She is seeing someone for her back and reports she may be getting an injection soon.  She is also on Ozempic for weight loss. Explained that since she has an SFA stent we need to follow that as well as a history of carotid stenosis.  Will plan for follow-up in 12 months with ABI and carotid artery duplex    Recommendations to optimize cardiovascular risk: Abstinence from all tobacco products. Blood glucose control with goal A1c < 7%. Blood pressure control with goal blood pressure < 140/90 mmHg. Lipid reduction therapy with goal LDL-C <100 mg/dL  Aspirin  81mg  PO QD.  Atorvastatin  40-80mg  PO QD (or other high intensity statin therapy).     Norman GORMAN Serve MD [facility]

## 2023-07-04 NOTE — Progress Notes (Signed)
 Office Visit Note   Patient: [name]           Date of Birth: 10-20-1950           MRN: [id number] Visit Date: 07/04/2023 Requested by: Irven Doffing, MD [address] [place],  [place] [address] PCP: Irven Doffing, MD  Subjective: Chief Complaint  Patient presents with   Other    Review MRI scan thoracic and lumbar spine    HPI: [name] is a 73 y.o. female who presents to the office for MRI review. Patient denies any changes in symptoms.  Continues to complain mainly of bilateral buttock pain with radiation of pain to the ankles. No numbness/tingling. Has not had ABIs yet; these are scheduled for later today.  No history of prior hip or back surgery.  Symptoms have been ongoing for about 2 months now.  Takes gabapentin  600 mg once daily.  MRI results revealed: MR Thoracic Spine w/o contrast Result Date: 06/28/2023 CLINICAL DATA:  Chronic bilateral leg pain for 3 in 4 months EXAM: MRI THORACIC SPINE WITHOUT CONTRAST TECHNIQUE: Multiplanar, multisequence MR imaging of the thoracic spine was performed. No intravenous contrast was administered. COMPARISON:  Thoracic x-ray 01/16/2012 FINDINGS: Alignment:  Physiologic. Vertebrae: No acute fracture, evidence of discitis, or aggressive bone lesion. Cord:  Normal signal and morphology. Paraspinal and other soft tissues: No acute paraspinal abnormality. Disc levels: Disc spaces:  Disc spaces are maintained. C7-T1: Mild disc bulge. Moderate right foraminal stenosis. No left foraminal stenosis. No central canal stenosis. T1-T2: No disc protrusion. Moderate right and mild left foraminal stenosis. Mild bilateral facet arthropathy. No central canal stenosis. T2-T3: No disc protrusion, foraminal stenosis or central canal stenosis. T3-T4: No disc protrusion, foraminal stenosis or central canal stenosis. T4-T5: No disc protrusion, foraminal stenosis or central canal stenosis. T5-T6: No disc protrusion, foraminal stenosis or central  canal stenosis. T6-T7: No disc protrusion, foraminal stenosis or central canal stenosis. T7-T8: No disc protrusion, foraminal stenosis or central canal stenosis. T8-T9: No disc protrusion, foraminal stenosis or central canal stenosis. T9-T10: No disc protrusion, foraminal stenosis or central canal stenosis. T10-T11: No disc protrusion, foraminal stenosis or central canal stenosis. T11-T12: No disc protrusion, foraminal stenosis or central canal stenosis. IMPRESSION: 1. At C7-T1 there is a mild disc bulge. Moderate right foraminal stenosis. 2. At T1-T2 there is moderate right and mild left foraminal stenosis. Mild bilateral facet arthropathy. 3. No acute osseous injury of the thoracic spine. Electronically Signed   By: Julaine Blanch M.D.   On: 06/28/2023 16:31   MR Lumbar Spine w/o contrast Result Date: 06/28/2023 CLINICAL DATA:  Chronic back pain, bilateral leg pain for 3-4 months EXAM: MRI LUMBAR SPINE WITHOUT CONTRAST TECHNIQUE: Multiplanar, multisequence MR imaging of the lumbar spine was performed. No intravenous contrast was administered. COMPARISON:  11/30/2010 FINDINGS: Segmentation:  Standard. Alignment: Grade 1 anterolisthesis of L4 on L5 secondary to facet disease. Vertebrae: No acute fracture, evidence of discitis, or aggressive bone lesion. Conus medullaris and cauda equina: Conus extends to the L1 level. Conus and cauda equina appear normal. Paraspinal and other soft tissues: No acute paraspinal abnormality. Disc levels: Disc spaces: Mild disc height loss at L4-5. T12-L1: No significant disc bulge. Mild bilateral facet arthropathy. No foraminal or central canal stenosis. L1-L2: No disc protrusion. Mild bilateral facet arthropathy. Moderate bilateral foraminal stenosis. No central canal stenosis. L2-L3: Mild disc bulge. Mild bilateral facet arthropathy. No central canal stenosis. Mild-moderate bilateral foraminal stenosis. L3-L4: Mild disc bulge. Moderate bilateral facet  arthropathy with ligamentum  flavum thickening. Mild central canal stenosis. Mild right foraminal stenosis. Moderate left foraminal stenosis. L4-L5: Mild disc bulge. Severe bilateral facet arthropathy with ankylosis. No foraminal stenosis. No central canal stenosis. L5-S1: No disc protrusion. Severe bilateral facet arthropathy. No foraminal or central canal stenosis. IMPRESSION: 1. At L1-2 there is mild bilateral facet arthropathy. Moderate bilateral foraminal stenosis. 2. At L2-3 there is a mild disc bulge. Mild bilateral facet arthropathy. Mild-moderate bilateral foraminal stenosis. 3. At L3-4 there is a mild disc bulge. Moderate bilateral facet arthropathy with ligamentum flavum thickening. Mild central canal stenosis. Mild right foraminal stenosis. Moderate left foraminal stenosis. 4. At L4-5 there is a mild disc bulge. Severe bilateral facet arthropathy with ankylosis. 5. At L5-S1 there is severe bilateral facet arthropathy. 6. No acute osseous injury of the lumbar spine. Electronically Signed   By: Julaine Blanch M.D.   On: 06/28/2023 16:28                 ROS: All systems reviewed are negative as they relate to the chief complaint within the history of present illness.  Patient denies fevers or chills.  Assessment & Plan: Visit Diagnoses:  1. Bilateral leg pain     Plan: [name] is a 74 y.o. female who presents to the office for review of MRI of L-spine and T-spine.  She has MRI demonstrating primarily stenosis in the lumbar spine from L1-L2 to about L3-L4.  This goes along with her radiation of pain down to the lower calves/ankles.  After discussion of options, she would like to try a L-spine ESI.  She will be set up for ESI with Dr. Eldonna.  One-time prescription for tramadol  prescribed to help with sleeping.  ABIs not done yet so we will see her back in 4 weeks for clinical recheck to see how her pain has responded to the [facility] and review ABI findings.  Follow-Up Instructions: Return in about 4 weeks (around  08/01/2023).   Orders:  Orders Placed This Encounter  Procedures   Ambulatory referral to Physical Medicine Rehab   No orders of the defined types were placed in this encounter.     Procedures: No procedures performed   Clinical Data: No additional findings.  Objective: Vital Signs: LMP  (LMP Unknown)   Physical Exam:  Constitutional: Patient appears well-developed HEENT:  Head: Normocephalic Eyes:EOM are normal Neck: Normal range of motion Cardiovascular: Normal rate Pulmonary/chest: Effort normal Neurologic: Patient is alert Skin: Skin is warm Psychiatric: Patient has normal mood and affect  Ortho Exam: No significant antalgic gait. No change with prior exam. No significant lower extremity weakness.   Specialty Comments:  No specialty comments available.  Imaging: VAS US  ABI WITH/WO TBI Result Date: 07/04/2023  LOWER EXTREMITY DOPPLER STUDY Patient Name:  Eliza Green  Date of Exam:   07/04/2023 Medical Rec #: [id number]              Accession #:    [id number] Date of Birth: 08/07/1950              Patient Gender: F Patient Age:   68 years Exam Location:  [facility] Procedure:      VAS US  ABI WITH/WO TBI Referring Phys: CORDELLA HUTCHINSON --------------------------------------------------------------------------------  Indications: Peripheral artery disease. High Risk         Hypertension, hyperlipidemia, Diabetes, past history of Factors:          smoking.  Comparison Study: 08/17/2021  Right: Resting right ankle- brachial index is within normal                   range. No evidence of significant right lower extremity                   arterial disease. The right toe- brachial index is normal.                    Left: Resting left ankle- brachial index is within normal                   range. No evidence of significant left lower extremity                   arterial disease. The left toe- brachial index is abnormal. Performing  Technologist: Delice Locus RVT  Examination Guidelines: A complete evaluation includes at minimum, Doppler waveform signals and systolic blood pressure reading at the level of bilateral brachial, anterior tibial, and posterior tibial arteries, when vessel segments are accessible. Bilateral testing is considered an integral part of a complete examination. Photoelectric Plethysmograph (PPG) waveforms and toe systolic pressure readings are included as required and additional duplex testing as needed. Limited examinations for reoccurring indications may be performed as noted.  ABI Findings: +---------+------------------+-----+--------+--------+ Right    Rt Pressure (mmHg)IndexWaveformComment  +---------+------------------+-----+--------+--------+ Brachial 131                                     +---------+------------------+-----+--------+--------+ PTA      121               0.92 biphasic         +---------+------------------+-----+--------+--------+ DP       100               0.76 biphasic         +---------+------------------+-----+--------+--------+ Great Toe79                0.60 Abnormal         +---------+------------------+-----+--------+--------+ +---------+------------------+-----+---------+-------+ Left     Lt Pressure (mmHg)IndexWaveform Comment +---------+------------------+-----+---------+-------+ Brachial 123                                     +---------+------------------+-----+---------+-------+ PTA      128               0.98 triphasic        +---------+------------------+-----+---------+-------+ DP       98                0.75 biphasic         +---------+------------------+-----+---------+-------+ Great Toe88                0.67 Abnormal         +---------+------------------+-----+---------+-------+ +-------+-----------+-----------+------------+------------+ ABI/TBIToday's ABIToday's TBIPrevious ABIPrevious TBI  +-------+-----------+-----------+------------+------------+ Right  0.92       0.60       1.03        0.95         +-------+-----------+-----------+------------+------------+ Left   0.98       0.60       1.09        0.64         +-------+-----------+-----------+------------+------------+  Right ABIs and TBIs appear decreased compared to prior study on  08/17/2021.  Summary: Right: Resting right ankle-brachial index indicates mild right lower extremity arterial disease. The right toe-brachial index is abnormal. Left: Resting left ankle-brachial index is within normal range. The left toe-brachial index is abnormal. *See table(s) above for measurements and observations.  Electronically signed by Norman Serve on 07/04/2023 at 12:51:34 PM.    Final      PMFS History: Patient Active Problem List   Diagnosis Date Noted   Hypoxia 01/30/2023   Peripheral edema 01/30/2023   CHF exacerbation (HCC) 01/29/2023   Symptomatic anemia 09/04/2022   Dyslipidemia 02/26/2022   Type 2 diabetes mellitus with peripheral neuropathy (HCC) 02/26/2022   COPD without exacerbation (HCC) 02/26/2022   Acute on chronic respiratory failure with hypoxia (HCC) 02/26/2022   Acute hypoxemic respiratory failure (HCC) 02/26/2022   Chronic diastolic CHF (congestive heart failure) (HCC) 02/25/2022   Streptococcal pneumonia (HCC)    Streptococcal bacteremia    Sepsis (HCC) 11/29/2021   Acute bacterial bronchitis 11/08/2021   GI bleed 11/08/2021   Elevated troponin level not due myocardial infarction 11/07/2021   Acute cardiogenic pulmonary edema (HCC) 11/07/2021   Chronic respiratory failure with hypoxia (HCC) 11/07/2021   Mixed diabetic hyperlipidemia associated with type 2 diabetes mellitus (HCC) 11/07/2021   Melena    Anemia due to chronic blood loss    AVM (arteriovenous malformation) of small bowel, acquired with hemorrhage    Acute on chronic blood loss anemia 09/21/2021   Thrombocytosis 09/21/2021   Type 2  diabetes mellitus without complication, without long-term current use of insulin  (HCC) 09/21/2021   Obesity, Class III, BMI 40-49.9 (morbid obesity) (HCC) 08/27/2021   Acute upper gastrointestinal bleeding 05/09/2021   COPD (chronic obstructive pulmonary disease) (HCC)    Coronary artery disease    Osteoarthritis of left knee 04/18/2020   Tobacco use disorder 09/18/2018   Iron  deficiency anemia 04/09/2018   Chronic anemia 12/26/2015   PAD (peripheral artery disease) (HCC) 10/05/2013   Discomfort in chest 05/11/2012   Nicotine dependence 05/11/2012   Bradycardia 05/11/2012   Essential hypertension 05/11/2012   Back pain 05/11/2012   Past Medical History:  Diagnosis Date   Anemia 12/26/2015   Anxiety    Arthritis    Blood transfusion without reported diagnosis    CHF (congestive heart failure) (HCC)    Chronic pain    resolved per pt 04/12/20   COPD (chronic obstructive pulmonary disease) (HCC)    Coronary artery disease    Depression    Diabetes mellitus without complication (HCC)    type 2   GERD (gastroesophageal reflux disease)    Heart murmur    since birth; Echo 02/18/19: LVEF 68%, grade 1 diastolic dysfunction, mild AS (mean grad 12 mmHg), trace MR/PR, mild TR, PASP 32 mmHg     Hyperlipemia    Hypertension    PVD (peripheral vascular disease) (HCC)    right SFA stent 02/11/17 by Dr. Ladona   Sleep apnea    does not use cpap   Wears glasses     Family History  Problem Relation Age of Onset   Diabetes Mother    Hypertension Mother    Heart disease Mother    Heart disease Father    Hypertension Father    Hypertension Sister    Hypertension Brother    Diabetes Brother    Hypertension Sister    Hypertension Brother    Diabetes Brother    Hypertension Brother    Hypertension Brother    Hypertension Brother  Past Surgical History:  Procedure Laterality Date   ABDOMINAL HYSTERECTOMY     CARDIAC CATHETERIZATION     CATARACT EXTRACTION     CESAREAN SECTION      COLONOSCOPY N/A 01/08/2013   Procedure: COLONOSCOPY;  Surgeon: Belvie JONETTA Just, MD;  Location: [facility] ENDOSCOPY;  Service: Endoscopy;  Laterality: N/A;   COLONOSCOPY N/A 12/28/2015   Procedure: COLONOSCOPY;  Surgeon: Belvie Just, MD;  Location: [facility] ENDOSCOPY;  Service: Endoscopy;  Laterality: N/A;   COLONOSCOPY WITH PROPOFOL  N/A 10/05/2020   Procedure: COLONOSCOPY WITH PROPOFOL ;  Surgeon: Just Belvie, MD;  Location: [facility] ENDOSCOPY;  Service: Endoscopy;  Laterality: N/A;   ENTEROSCOPY N/A 12/28/2015   Procedure: ENTEROSCOPY;  Surgeon: Belvie Just, MD;  Location: [facility] ENDOSCOPY;  Service: Endoscopy;  Laterality: N/A;   ENTEROSCOPY N/A 02/20/2018   Procedure: ENTEROSCOPY;  Surgeon: Just Belvie, MD;  Location: [facility] ENDOSCOPY;  Service: Endoscopy;  Laterality: N/A;   ENTEROSCOPY N/A 03/27/2018   Procedure: ENTEROSCOPY;  Surgeon: Just Belvie, MD;  Location: [facility] ENDOSCOPY;  Service: Endoscopy;  Laterality: N/A;   ENTEROSCOPY N/A 10/05/2020   Procedure: ENTEROSCOPY;  Surgeon: Just Belvie, MD;  Location: [facility] ENDOSCOPY;  Service: Endoscopy;  Laterality: N/A;   ENTEROSCOPY N/A 05/11/2021   Procedure: ENTEROSCOPY;  Surgeon: Just Belvie, MD;  Location: [facility] ENDOSCOPY;  Service: Endoscopy;  Laterality: N/A;   ENTEROSCOPY N/A 09/23/2021   Procedure: ENTEROSCOPY;  Surgeon: Legrand Victory LITTIE DOUGLAS, MD;  Location: [facility] ENDOSCOPY;  Service: Gastroenterology;  Laterality: N/A;   ESOPHAGOGASTRODUODENOSCOPY N/A 01/08/2013   Procedure: ESOPHAGOGASTRODUODENOSCOPY (EGD);  Surgeon: Belvie JONETTA Just, MD;  Location: [facility];  Service: Endoscopy;  Laterality: N/A;   EYE SURGERY     GIVENS CAPSULE STUDY N/A 09/24/2021   Procedure: GIVENS CAPSULE STUDY;  Surgeon: Just Belvie, MD;  Location: [facility] ENDOSCOPY;  Service: Gastroenterology;  Laterality: N/A;   HAND SURGERY     HEMOSTASIS CLIP PLACEMENT  05/11/2021   Procedure: HEMOSTASIS CLIP PLACEMENT;  Surgeon: Just Belvie, MD;  Location: [facility] ENDOSCOPY;  Service: Endoscopy;;   HOT HEMOSTASIS  N/A 12/28/2015   Procedure: HOT HEMOSTASIS (ARGON PLASMA COAGULATION/BICAP);  Surgeon: Belvie Just, MD;  Location: [facility] ENDOSCOPY;  Service: Endoscopy;  Laterality: N/A;   HOT HEMOSTASIS N/A 02/20/2018   Procedure: HOT HEMOSTASIS (ARGON PLASMA COAGULATION/BICAP);  Surgeon: Just Belvie, MD;  Location: [facility];  Service: Endoscopy;  Laterality: N/A;   HOT HEMOSTASIS N/A 03/27/2018   Procedure: HOT HEMOSTASIS (ARGON PLASMA COAGULATION/BICAP);  Surgeon: Just Belvie, MD;  Location: [facility];  Service: Endoscopy;  Laterality: N/A;   HOT HEMOSTASIS N/A 10/05/2020   Procedure: HOT HEMOSTASIS (ARGON PLASMA COAGULATION/BICAP);  Surgeon: Just Belvie, MD;  Location: [facility];  Service: Endoscopy;  Laterality: N/A;   HOT HEMOSTASIS N/A 05/11/2021   Procedure: HOT HEMOSTASIS (ARGON PLASMA COAGULATION/BICAP);  Surgeon: Just Belvie, MD;  Location: [facility];  Service: Endoscopy;  Laterality: N/A;   LEFT HEART CATHETERIZATION WITH CORONARY ANGIOGRAM N/A 03/09/2013   Procedure: LEFT HEART CATHETERIZATION WITH CORONARY ANGIOGRAM;  Surgeon: Erick JONELLE Bergamo, MD;  Location[facility] CATH LAB;  Service: Cardiovascular;  Laterality: N/A;   LOWER EXTREMITY ANGIOGRAM N/A 12/29/2012   Procedure: LOWER EXTREMITY ANGIOGRAM;  Surgeon: Erick JONELLE Bergamo, MD;  Location: [facility] CATH LAB;  Service: Cardiovascular;  Laterality: N/A;   LOWER EXTREMITY ANGIOGRAM N/A 10/05/2013   Procedure: LOWER EXTREMITY ANGIOGRAM;  Surgeon: Erick JONELLE Bergamo, MD;  Location: [facility] CATH LAB;  Service: Cardiovascular;  Laterality: N/A;   LOWER EXTREMITY ANGIOGRAPHY N/A 02/11/2017   Procedure: Lower Extremity  Angiography;  Surgeon: Ladona Heinz, MD;  Location: [facility] INVASIVE CV LAB;  Service: Cardiovascular;  Laterality: N/A;   PERIPHERAL VASCULAR BALLOON ANGIOPLASTY  02/11/2017   Procedure: PERIPHERAL VASCULAR BALLOON ANGIOPLASTY;  Surgeon: Ladona Heinz, MD;  Location: [facility] INVASIVE CV LAB;  Service: Cardiovascular;;  Right SFA   PERIPHERAL VASCULAR  INTERVENTION Right 02/11/2017   Procedure: PERIPHERAL VASCULAR INTERVENTION;  Surgeon: Ladona Heinz, MD;  Location: [facility] INVASIVE CV LAB;  Service: Cardiovascular;  Laterality: Right;  Rt SFA   POLYPECTOMY  10/05/2020   Procedure: POLYPECTOMY;  Surgeon: Rollin Dover, MD;  Location: [facility] ENDOSCOPY;  Service: Endoscopy;;   stent in right leg  12/29/2012   for blood clot, Dr. Margaretann   TOTAL KNEE ARTHROPLASTY Left 04/18/2020   TOTAL KNEE ARTHROPLASTY Left 04/18/2020   Procedure: LEFT TOTAL KNEE ARTHROPLASTY;  Surgeon: Addie Cordella Hamilton, MD;  Location: [facility] OR;  Service: Orthopedics;  Laterality: Left;   TYMPANOMASTOIDECTOMY Left 03/08/2014   Procedure: TYMPANOMASTOIDECTOMY LEFT;  Surgeon: Ana LELON Moccasin, MD;  Location:  SURGERY CENTER;  Service: ENT;  Laterality: Left;   TYMPANOMASTOIDECTOMY  2015   UTERINE FIBROID SURGERY     Social History   Occupational History   Not on file  Tobacco Use   Smoking status: Former    Current packs/day: 0.00    Average packs/day: 0.6 packs/day for 54.8 years (32.9 ttl pk-yrs)    Types: Cigarettes    Start date: 08/18/1966    Quit date: 05/25/2021    Years since quitting: 2.1   Smokeless tobacco: Never  Vaping Use   Vaping status: Never Used  Substance and Sexual Activity   Alcohol  use: Yes    Comment: occasional   Drug use: Not Currently    Types: Marijuana, Cocaine    Comment: Last use 04/11/20   Sexual activity: Not Currently    Birth control/protection: Surgical    Comment: Hysterectomy

## 2023-07-06 ENCOUNTER — Encounter: Payer: Self-pay | Admitting: Orthopedic Surgery

## 2023-07-06 MED ORDER — TRAMADOL HCL 50 MG PO TABS: 50.0000 mg | ORAL_TABLET | Freq: Every evening | ORAL | 0 refills | Status: AC | PRN

## 2023-07-07 ENCOUNTER — Encounter: Payer: Self-pay | Admitting: Internal Medicine

## 2023-07-15 ENCOUNTER — Other Ambulatory Visit: Payer: Self-pay

## 2023-07-15 DIAGNOSIS — I6523 Occlusion and stenosis of bilateral carotid arteries: Secondary | ICD-10-CM

## 2023-07-15 DIAGNOSIS — I739 Peripheral vascular disease, unspecified: Secondary | ICD-10-CM

## 2023-07-21 ENCOUNTER — Ambulatory Visit
Admission: RE | Admit: 2023-07-21 | Discharge: 2023-07-21 | Disposition: A | Discharge status: Home / Self Care | Payer: Medicare HMO | Source: Ambulatory Visit | Attending: Acute Care | Admitting: Acute Care

## 2023-07-21 DIAGNOSIS — Z122 Encounter for screening for malignant neoplasm of respiratory organs: Secondary | ICD-10-CM

## 2023-07-21 DIAGNOSIS — Z87891 Personal history of nicotine dependence: Secondary | ICD-10-CM

## 2023-07-23 ENCOUNTER — Ambulatory Visit: Payer: Medicare HMO | Admitting: Physical Medicine and Rehabilitation

## 2023-07-23 ENCOUNTER — Other Ambulatory Visit: Payer: Self-pay

## 2023-07-23 DIAGNOSIS — M5416 Radiculopathy, lumbar region: Secondary | ICD-10-CM

## 2023-07-23 MED ORDER — METHYLPREDNISOLONE ACETATE 40 MG/ML IJ SUSP
40.0000 mg | Freq: Once | INTRAMUSCULAR | Status: AC
  Administered 2023-07-23: 40 mg

## 2023-07-23 NOTE — Patient Instructions (Signed)

## 2023-07-23 NOTE — Progress Notes (Unsigned)
Functional Pain Scale - descriptive words and definitions  Uncomfortable (3)  Pain is present but can complete all ADL's/sleep is slightly affected and passive distraction only gives marginal relief. Mild range order  Average Pain 4  127/80 +Driver, -BT, -Dye Allergies.

## 2023-07-24 NOTE — Progress Notes (Signed)
[name] - 73 y.o. female MRN [id number]  Date of birth: 11/29/1950  Office Visit Note: Visit Date: 07/23/2023 PCP: Elayne Guerin, MD Referred by: Elayne Guerin, MD  Subjective: Chief Complaint  Patient presents with   Lower Back - Pain   HPI:  [name] is a 73 y.o. female who comes in today at the request of Karenann Cai, PA-C for planned Bilateral L4-5 Lumbar Transforaminal epidural steroid injection with fluoroscopic guidance.  The patient has failed conservative care including home exercise, medications, time and activity modification.  This injection will be diagnostic and hopefully therapeutic.  Please see requesting physician notes for further details and justification.   ROS Otherwise per HPI.  Assessment & Plan: Visit Diagnoses:    ICD-10-CM   1. Lumbar radiculopathy  M54.16 XR C-ARM NO REPORT    Epidural Steroid injection    methylPREDNISolone acetate (DEPO-MEDROL) injection 40 mg      Plan: No additional findings.   Meds & Orders:  Meds ordered this encounter  Medications   methylPREDNISolone acetate (DEPO-MEDROL) injection 40 mg    Orders Placed This Encounter  Procedures   XR C-ARM NO REPORT   Epidural Steroid injection    Follow-up: Return for visit to requesting provider as needed.   Procedures: No procedures performed  Lumbosacral Transforaminal Epidural Steroid Injection - Sub-Pedicular Approach with Fluoroscopic Guidance  Patient: [name]      Date of Birth: 1950-08-14 MRN: [id number] PCP: Elayne Guerin, MD      Visit Date: 07/23/2023   Universal Protocol:    Date/Time: 07/23/2023  Consent Given By: the patient  Position: PRONE  Additional Comments: Vital signs were monitored before and after the procedure. Patient was prepped and draped in the usual sterile fashion. The correct patient, procedure, and site was verified.   Injection Procedure Details:   Procedure diagnoses: Lumbar radiculopathy  [M54.16]    Meds Administered:  Meds ordered this encounter  Medications   methylPREDNISolone acetate (DEPO-MEDROL) injection 40 mg    Laterality: Bilateral  Location/Site: L4  Needle:5.0 in., 22 ga.  Short bevel or Quincke spinal needle  Needle Placement: Transforaminal  Findings:    -Comments: Excellent flow of contrast along the nerve, nerve root and into the epidural space.  Procedure Details: After squaring off the end-plates to get a true AP view, the C-arm was positioned so that an oblique view of the foramen as noted above was visualized. The target area is just inferior to the "nose of the scotty dog" or sub pedicular. The soft tissues overlying this structure were infiltrated with 2-3 ml. of 1% Lidocaine without Epinephrine.  The spinal needle was inserted toward the target using a "trajectory" view along the fluoroscope beam.  Under AP and lateral visualization, the needle was advanced so it did not puncture dura and was located close the 6 O'Clock position of the pedical in AP tracterory. Biplanar projections were used to confirm position. Aspiration was confirmed to be negative for CSF and/or blood. A 1-2 ml. volume of Isovue-250 was injected and flow of contrast was noted at each level. Radiographs were obtained for documentation purposes.   After attaining the desired flow of contrast documented above, a 0.5 to 1.0 ml test dose of 0.25% Marcaine was injected into each respective transforaminal space.  The patient was observed for 90 seconds post injection.  After no sensory deficits were reported, and normal lower extremity motor function was noted,   the above injectate was administered so  that equal amounts of the injectate were placed at each foramen (level) into the transforaminal epidural space.   Additional Comments:  The patient tolerated the procedure well Dressing: 2 x 2 sterile gauze and Band-Aid    Post-procedure details: Patient was observed during the  procedure. Post-procedure instructions were reviewed.  Patient left the clinic in stable condition.    Clinical History: MRI LUMBAR SPINE WITHOUT CONTRAST   TECHNIQUE: Multiplanar, multisequence MR imaging of the lumbar spine was performed. No intravenous contrast was administered.   COMPARISON:  11/30/2010   FINDINGS: Segmentation:  Standard.   Alignment: Grade 1 anterolisthesis of L4 on L5 secondary to facet disease.   Vertebrae: No acute fracture, evidence of discitis, or aggressive bone lesion.   Conus medullaris and cauda equina: Conus extends to the L1 level. Conus and cauda equina appear normal.   Paraspinal and other soft tissues: No acute paraspinal abnormality.   Disc levels:   Disc spaces: Mild disc height loss at L4-5.   T12-L1: No significant disc bulge. Mild bilateral facet arthropathy. No foraminal or central canal stenosis.   L1-L2: No disc protrusion. Mild bilateral facet arthropathy. Moderate bilateral foraminal stenosis. No central canal stenosis.   L2-L3: Mild disc bulge. Mild bilateral facet arthropathy. No central canal stenosis. Mild-moderate bilateral foraminal stenosis.   L3-L4: Mild disc bulge. Moderate bilateral facet arthropathy with ligamentum flavum thickening. Mild central canal stenosis. Mild right foraminal stenosis. Moderate left foraminal stenosis.   L4-L5: Mild disc bulge. Severe bilateral facet arthropathy with ankylosis. No foraminal stenosis. No central canal stenosis.   L5-S1: No disc protrusion. Severe bilateral facet arthropathy. No foraminal or central canal stenosis.   IMPRESSION: 1. At L1-2 there is mild bilateral facet arthropathy. Moderate bilateral foraminal stenosis. 2. At L2-3 there is a mild disc bulge. Mild bilateral facet arthropathy. Mild-moderate bilateral foraminal stenosis. 3. At L3-4 there is a mild disc bulge. Moderate bilateral facet arthropathy with ligamentum flavum thickening. Mild central  canal stenosis. Mild right foraminal stenosis. Moderate left foraminal stenosis. 4. At L4-5 there is a mild disc bulge. Severe bilateral facet arthropathy with ankylosis. 5. At L5-S1 there is severe bilateral facet arthropathy. 6. No acute osseous injury of the lumbar spine.     Electronically Signed   By: Elige Ko M.D.   On: 06/28/2023 16:28     Objective:  VS:  HT:    WT:   BMI:     BP:   HR: bpm  TEMP: ( )  RESP:  Physical Exam Vitals and nursing note reviewed.  Constitutional:      General: She is not in acute distress.    Appearance: Normal appearance. She is well-developed. She is obese. She is not ill-appearing.  HENT:     Head: Normocephalic and atraumatic.     Right Ear: External ear normal.     Left Ear: External ear normal.  Eyes:     Extraocular Movements: Extraocular movements intact.     Conjunctiva/sclera: Conjunctivae normal.     Pupils: Pupils are equal, round, and reactive to light.  Cardiovascular:     Rate and Rhythm: Normal rate.     Pulses: Normal pulses.  Pulmonary:     Effort: Pulmonary effort is normal. No respiratory distress.  Abdominal:     General: There is no distension.     Palpations: Abdomen is soft.  Musculoskeletal:        General: Tenderness present.     Cervical back: Neck supple.  Right lower leg: No edema.     Left lower leg: No edema.     Comments: Patient has good distal strength with no pain over the greater trochanters.  No clonus or focal weakness.  Skin:    General: Skin is warm and dry.     Findings: No erythema, lesion or rash.  Neurological:     General: No focal deficit present.     Mental Status: She is alert and oriented to person, place, and time.     Sensory: No sensory deficit.     Motor: No weakness or abnormal muscle tone.     Coordination: Coordination normal.     Gait: Gait normal.  Psychiatric:        Mood and Affect: Mood normal.        Behavior: Behavior normal.      Imaging: No  results found.

## 2023-07-24 NOTE — Procedures (Signed)
Lumbosacral Transforaminal Epidural Steroid Injection - Sub-Pedicular Approach with Fluoroscopic Guidance  Patient: [name]      Date of Birth: 1950/11/28 MRN: [id number] PCP: Elayne Guerin, MD      Visit Date: 07/23/2023   Universal Protocol:    Date/Time: 07/23/2023  Consent Given By: the patient  Position: PRONE  Additional Comments: Vital signs were monitored before and after the procedure. Patient was prepped and draped in the usual sterile fashion. The correct patient, procedure, and site was verified.   Injection Procedure Details:   Procedure diagnoses: Lumbar radiculopathy [M54.16]    Meds Administered:  Meds ordered this encounter  Medications   methylPREDNISolone acetate (DEPO-MEDROL) injection 40 mg    Laterality: Bilateral  Location/Site: L4  Needle:5.0 in., 22 ga.  Short bevel or Quincke spinal needle  Needle Placement: Transforaminal  Findings:    -Comments: Excellent flow of contrast along the nerve, nerve root and into the epidural space.  Procedure Details: After squaring off the end-plates to get a true AP view, the C-arm was positioned so that an oblique view of the foramen as noted above was visualized. The target area is just inferior to the "nose of the scotty dog" or sub pedicular. The soft tissues overlying this structure were infiltrated with 2-3 ml. of 1% Lidocaine without Epinephrine.  The spinal needle was inserted toward the target using a "trajectory" view along the fluoroscope beam.  Under AP and lateral visualization, the needle was advanced so it did not puncture dura and was located close the 6 O'Clock position of the pedical in AP tracterory. Biplanar projections were used to confirm position. Aspiration was confirmed to be negative for CSF and/or blood. A 1-2 ml. volume of Isovue-250 was injected and flow of contrast was noted at each level. Radiographs were obtained for documentation purposes.   After attaining the  desired flow of contrast documented above, a 0.5 to 1.0 ml test dose of 0.25% Marcaine was injected into each respective transforaminal space.  The patient was observed for 90 seconds post injection.  After no sensory deficits were reported, and normal lower extremity motor function was noted,   the above injectate was administered so that equal amounts of the injectate were placed at each foramen (level) into the transforaminal epidural space.   Additional Comments:  The patient tolerated the procedure well Dressing: 2 x 2 sterile gauze and Band-Aid    Post-procedure details: Patient was observed during the procedure. Post-procedure instructions were reviewed.  Patient left the clinic in stable condition.

## 2023-07-25 ENCOUNTER — Ambulatory Visit: Payer: Medicare HMO | Admitting: Orthopedic Surgery

## 2023-07-31 ENCOUNTER — Other Ambulatory Visit: Payer: Self-pay

## 2023-07-31 DIAGNOSIS — F17210 Nicotine dependence, cigarettes, uncomplicated: Secondary | ICD-10-CM

## 2023-07-31 DIAGNOSIS — Z122 Encounter for screening for malignant neoplasm of respiratory organs: Secondary | ICD-10-CM

## 2023-07-31 DIAGNOSIS — Z87891 Personal history of nicotine dependence: Secondary | ICD-10-CM

## 2023-08-11 ENCOUNTER — Ambulatory Visit (INDEPENDENT_AMBULATORY_CARE_PROVIDER_SITE_OTHER): Payer: Medicare HMO | Admitting: Orthopedic Surgery

## 2023-08-11 ENCOUNTER — Encounter: Payer: Self-pay | Admitting: Orthopedic Surgery

## 2023-08-11 DIAGNOSIS — M5416 Radiculopathy, lumbar region: Secondary | ICD-10-CM | POA: Diagnosis not present

## 2023-08-11 NOTE — Progress Notes (Signed)
Office Visit Note   Patient: [name]           Date of Birth: 1951-01-03           MRN: [id number] Visit Date: 08/11/2023 Requested by: Elayne Guerin, MD [address] [place],  [place] [address] PCP: Elayne Guerin, MD  Subjective: Chief Complaint  Patient presents with   Other    Follow up leg pain    HPI: George Alcantar is a 73 y.o. female who presents to the office reporting improvement in low back pain following lumbar ESI from 07/23/2023.  Since that time she has also had ABIs which were normal.  She did get very good relief and only has minimal pain at this time.  She did have facet arthritis in the lumbar spine.  Taking Ultram and gabapentin for her symptoms..                ROS: All systems reviewed are negative as they relate to the chief complaint within the history of present illness.  Patient denies fevers or chills.  Assessment & Plan: Visit Diagnoses:  1. Lumbar radiculopathy     Plan: Impression is improvement in symptoms following lumbar ESI.  We can repeat that on an as-needed basis not more than 6 times a year.  She will let us know when she wants to get that repeated.  We can set her up to see Dr. Alvester Morin.  Continue with nonload bearing quad strengthening exercises as well as stretching and core strengthening in the meantime.  Follow-Up Instructions: No follow-ups on file.   Orders:  No orders of the defined types were placed in this encounter.  No orders of the defined types were placed in this encounter.     Procedures: No procedures performed   Clinical Data: No additional findings.  Objective: Vital Signs: LMP  (LMP Unknown)   Physical Exam:  Constitutional: Patient appears well-developed HEENT:  Head: Normocephalic Eyes:EOM are normal Neck: Normal range of motion Cardiovascular: Normal rate Pulmonary/chest: Effort normal Neurologic: Patient is alert Skin: Skin is warm Psychiatric: Patient has normal mood and  affect  Ortho Exam:Patient has bilateral 5 out of 5 ankle dorsiflexion plantarflexion quad hamstring and hip flexion strength.  Bilateral palpable pedal pulses and no paresthesias L1-S1 in either leg.  No nerve root tension signs.  Minimal pain with forward and lateral bending.  No groin pain with internal and external rotation of either leg.  No trochanteric tenderness.  No masses lymphadenopathy or skin changes around the back.   Specialty Comments:  MRI LUMBAR SPINE WITHOUT CONTRAST   TECHNIQUE: Multiplanar, multisequence MR imaging of the lumbar spine was performed. No intravenous contrast was administered.   COMPARISON:  11/30/2010   FINDINGS: Segmentation:  Standard.   Alignment: Grade 1 anterolisthesis of L4 on L5 secondary to facet disease.   Vertebrae: No acute fracture, evidence of discitis, or aggressive bone lesion.   Conus medullaris and cauda equina: Conus extends to the L1 level. Conus and cauda equina appear normal.   Paraspinal and other soft tissues: No acute paraspinal abnormality.   Disc levels:   Disc spaces: Mild disc height loss at L4-5.   T12-L1: No significant disc bulge. Mild bilateral facet arthropathy. No foraminal or central canal stenosis.   L1-L2: No disc protrusion. Mild bilateral facet arthropathy. Moderate bilateral foraminal stenosis. No central canal stenosis.   L2-L3: Mild disc bulge. Mild bilateral facet arthropathy. No central canal stenosis. Mild-moderate bilateral foraminal stenosis.  L3-L4: Mild disc bulge. Moderate bilateral facet arthropathy with ligamentum flavum thickening. Mild central canal stenosis. Mild right foraminal stenosis. Moderate left foraminal stenosis.   L4-L5: Mild disc bulge. Severe bilateral facet arthropathy with ankylosis. No foraminal stenosis. No central canal stenosis.   L5-S1: No disc protrusion. Severe bilateral facet arthropathy. No foraminal or central canal stenosis.   IMPRESSION: 1. At L1-2  there is mild bilateral facet arthropathy. Moderate bilateral foraminal stenosis. 2. At L2-3 there is a mild disc bulge. Mild bilateral facet arthropathy. Mild-moderate bilateral foraminal stenosis. 3. At L3-4 there is a mild disc bulge. Moderate bilateral facet arthropathy with ligamentum flavum thickening. Mild central canal stenosis. Mild right foraminal stenosis. Moderate left foraminal stenosis. 4. At L4-5 there is a mild disc bulge. Severe bilateral facet arthropathy with ankylosis. 5. At L5-S1 there is severe bilateral facet arthropathy. 6. No acute osseous injury of the lumbar spine.     Electronically Signed   By: Elige Ko M.D.   On: 06/28/2023 16:28  Imaging: No results found.   PMFS History: Patient Active Problem List   Diagnosis Date Noted   Hypoxia 01/30/2023   Peripheral edema 01/30/2023   CHF exacerbation (HCC) 01/29/2023   Symptomatic anemia 09/04/2022   Dyslipidemia 02/26/2022   Type 2 diabetes mellitus with peripheral neuropathy (HCC) 02/26/2022   COPD without exacerbation (HCC) 02/26/2022   Acute on chronic respiratory failure with hypoxia (HCC) 02/26/2022   Acute hypoxemic respiratory failure (HCC) 02/26/2022   Chronic diastolic CHF (congestive heart failure) (HCC) 02/25/2022   Streptococcal pneumonia (HCC)    Streptococcal bacteremia    Sepsis (HCC) 11/29/2021   Acute bacterial bronchitis 11/08/2021   GI bleed 11/08/2021   Elevated troponin level not due myocardial infarction 11/07/2021   Acute cardiogenic pulmonary edema (HCC) 11/07/2021   Chronic respiratory failure with hypoxia (HCC) 11/07/2021   Mixed diabetic hyperlipidemia associated with type 2 diabetes mellitus (HCC) 11/07/2021   Melena    Anemia due to chronic blood loss    AVM (arteriovenous malformation) of small bowel, acquired with hemorrhage    Acute on chronic blood loss anemia 09/21/2021   Thrombocytosis 09/21/2021   Type 2 diabetes mellitus without complication, without  long-term current use of insulin (HCC) 09/21/2021   Obesity, Class III, BMI 40-49.9 (morbid obesity) (HCC) 08/27/2021   Acute upper gastrointestinal bleeding 05/09/2021   COPD (chronic obstructive pulmonary disease) (HCC)    Coronary artery disease    Osteoarthritis of left knee 04/18/2020   Tobacco use disorder 09/18/2018   Iron deficiency anemia 04/09/2018   Chronic anemia 12/26/2015   PAD (peripheral artery disease) (HCC) 10/05/2013   Discomfort in chest 05/11/2012   Nicotine dependence 05/11/2012   Bradycardia 05/11/2012   Essential hypertension 05/11/2012   Back pain 05/11/2012   Past Medical History:  Diagnosis Date   Anemia 12/26/2015   Anxiety    Arthritis    Blood transfusion without reported diagnosis    CHF (congestive heart failure) (HCC)    Chronic pain    resolved per pt 04/12/20   COPD (chronic obstructive pulmonary disease) (HCC)    Coronary artery disease    Depression    Diabetes mellitus without complication (HCC)    type 2   GERD (gastroesophageal reflux disease)    Heart murmur    since birth; Echo 02/18/19: LVEF 68%, grade 1 diastolic dysfunction, mild AS (mean grad 12 mmHg), trace MR/PR, mild TR, PASP 32 mmHg     Hyperlipemia    Hypertension  PVD (peripheral vascular disease) (HCC)    right SFA stent 02/11/17 by Dr. Jacinto Halim   Sleep apnea    does not use cpap   Wears glasses     Family History  Problem Relation Age of Onset   Diabetes Mother    Hypertension Mother    Heart disease Mother    Heart disease Father    Hypertension Father    Hypertension Sister    Hypertension Brother    Diabetes Brother    Hypertension Sister    Hypertension Brother    Diabetes Brother    Hypertension Brother    Hypertension Brother    Hypertension Brother     Past Surgical History:  Procedure Laterality Date   ABDOMINAL HYSTERECTOMY     CARDIAC CATHETERIZATION     CATARACT EXTRACTION     CESAREAN SECTION     COLONOSCOPY N/A 01/08/2013   Procedure:  COLONOSCOPY;  Surgeon: Theda Belfast, MD;  Location: [facility] ENDOSCOPY;  Service: Endoscopy;  Laterality: N/A;   COLONOSCOPY N/A 12/28/2015   Procedure: COLONOSCOPY;  Surgeon: Jeani Hawking, MD;  Location: [facility] ENDOSCOPY;  Service: Endoscopy;  Laterality: N/A;   COLONOSCOPY WITH PROPOFOL N/A 10/05/2020   Procedure: COLONOSCOPY WITH PROPOFOL;  Surgeon: Jeani Hawking, MD;  Location: [facility] ENDOSCOPY;  Service: Endoscopy;  Laterality: N/A;   ENTEROSCOPY N/A 12/28/2015   Procedure: ENTEROSCOPY;  Surgeon: Jeani Hawking, MD;  Location: [facility] ENDOSCOPY;  Service: Endoscopy;  Laterality: N/A;   ENTEROSCOPY N/A 02/20/2018   Procedure: ENTEROSCOPY;  Surgeon: Jeani Hawking, MD;  Location: [facility] ENDOSCOPY;  Service: Endoscopy;  Laterality: N/A;   ENTEROSCOPY N/A 03/27/2018   Procedure: ENTEROSCOPY;  Surgeon: Jeani Hawking, MD;  Location: [facility] ENDOSCOPY;  Service: Endoscopy;  Laterality: N/A;   ENTEROSCOPY N/A 10/05/2020   Procedure: ENTEROSCOPY;  Surgeon: Jeani Hawking, MD;  Location: [facility] ENDOSCOPY;  Service: Endoscopy;  Laterality: N/A;   ENTEROSCOPY N/A 05/11/2021   Procedure: ENTEROSCOPY;  Surgeon: Jeani Hawking, MD;  Location: [facility] ENDOSCOPY;  Service: Endoscopy;  Laterality: N/A;   ENTEROSCOPY N/A 09/23/2021   Procedure: ENTEROSCOPY;  Surgeon: Sherrilyn Rist, MD;  Location: [facility] ENDOSCOPY;  Service: Gastroenterology;  Laterality: N/A;   ESOPHAGOGASTRODUODENOSCOPY N/A 01/08/2013   Procedure: ESOPHAGOGASTRODUODENOSCOPY (EGD);  Surgeon: Theda Belfast, MD;  Location: [facility] ENDOSCOPY;  Service: Endoscopy;  Laterality: N/A;   EYE SURGERY     GIVENS CAPSULE STUDY N/A 09/24/2021   Procedure: GIVENS CAPSULE STUDY;  Surgeon: Jeani Hawking, MD;  Location: [facility] ENDOSCOPY;  Service: Gastroenterology;  Laterality: N/A;   HAND SURGERY     HEMOSTASIS CLIP PLACEMENT  05/11/2021   Procedure: HEMOSTASIS CLIP PLACEMENT;  Surgeon: Jeani Hawking, MD;  Location: [facility] ENDOSCOPY;  Service: Endoscopy;;   HOT HEMOSTASIS N/A 12/28/2015   Procedure: HOT HEMOSTASIS  (ARGON PLASMA COAGULATION/BICAP);  Surgeon: Jeani Hawking, MD;  Location: [facility] ENDOSCOPY;  Service: Endoscopy;  Laterality: N/A;   HOT HEMOSTASIS N/A 02/20/2018   Procedure: HOT HEMOSTASIS (ARGON PLASMA COAGULATION/BICAP);  Surgeon: Jeani Hawking, MD;  Location: [facility] ENDOSCOPY;  Service: Endoscopy;  Laterality: N/A;   HOT HEMOSTASIS N/A 03/27/2018   Procedure: HOT HEMOSTASIS (ARGON PLASMA COAGULATION/BICAP);  Surgeon: Jeani Hawking, MD;  Location: [facility] ENDOSCOPY;  Service: Endoscopy;  Laterality: N/A;   HOT HEMOSTASIS N/A 10/05/2020   Procedure: HOT HEMOSTASIS (ARGON PLASMA COAGULATION/BICAP);  Surgeon: Jeani Hawking, MD;  Location: [facility] ENDOSCOPY;  Service: Endoscopy;  Laterality: N/A;   HOT HEMOSTASIS N/A 05/11/2021   Procedure: HOT HEMOSTASIS (ARGON PLASMA COAGULATION/BICAP);  Surgeon: Jeani Hawking, MD;  Location: [facility] ENDOSCOPY;  Service: Endoscopy;  Laterality: N/A;   LEFT HEART CATHETERIZATION WITH CORONARY ANGIOGRAM N/A 03/09/2013   Procedure: LEFT HEART CATHETERIZATION WITH CORONARY ANGIOGRAM;  Surgeon: Pamella Pert, MD;  Location: [facility] CATH LAB;  Service: Cardiovascular;  Laterality: N/A;   LOWER EXTREMITY ANGIOGRAM N/A 12/29/2012   Procedure: LOWER EXTREMITY ANGIOGRAM;  Surgeon: Pamella Pert, MD;  Location: [facility] CATH LAB;  Service: Cardiovascular;  Laterality: N/A;   LOWER EXTREMITY ANGIOGRAM N/A 10/05/2013   Procedure: LOWER EXTREMITY ANGIOGRAM;  Surgeon: Pamella Pert, MD;  Location: [facility] CATH LAB;  Service: Cardiovascular;  Laterality: N/A;   LOWER EXTREMITY ANGIOGRAPHY N/A 02/11/2017   Procedure: Lower Extremity Angiography;  Surgeon: Yates Decamp, MD;  Location: [facility] INVASIVE CV LAB;  Service: Cardiovascular;  Laterality: N/A;   PERIPHERAL VASCULAR BALLOON ANGIOPLASTY  02/11/2017   Procedure: PERIPHERAL VASCULAR BALLOON ANGIOPLASTY;  Surgeon: Yates Decamp, MD;  Location: [facility] INVASIVE CV LAB;  Service: Cardiovascular;;  Right SFA   PERIPHERAL VASCULAR INTERVENTION Right 02/11/2017   Procedure:  PERIPHERAL VASCULAR INTERVENTION;  Surgeon: Yates Decamp, MD;  Location: [facility] INVASIVE CV LAB;  Service: Cardiovascular;  Laterality: Right;  Rt SFA   POLYPECTOMY  10/05/2020   Procedure: POLYPECTOMY;  Surgeon: Jeani Hawking, MD;  Location: [facility] ENDOSCOPY;  Service: Endoscopy;;   stent in right leg  12/29/2012   for blood clot, Dr. Nadara Eaton   TOTAL KNEE ARTHROPLASTY Left 04/18/2020   TOTAL KNEE ARTHROPLASTY Left 04/18/2020   Procedure: LEFT TOTAL KNEE ARTHROPLASTY;  Surgeon: Cammy Copa, MD;  Location: [facility] OR;  Service: Orthopedics;  Laterality: Left;   TYMPANOMASTOIDECTOMY Left 03/08/2014   Procedure: TYMPANOMASTOIDECTOMY LEFT;  Surgeon: Darletta Moll, MD;  Location: [facility];  Service: ENT;  Laterality: Left;   TYMPANOMASTOIDECTOMY  2015   UTERINE FIBROID SURGERY     Social History   Occupational History   Not on file  Tobacco Use   Smoking status: Former    Current packs/day: 0.00    Average packs/day: 0.6 packs/day for 54.8 years (32.9 ttl pk-yrs)    Types: Cigarettes    Start date: 08/18/1966    Quit date: 05/25/2021    Years since quitting: 2.2   Smokeless tobacco: Never  Vaping Use   Vaping status: Never Used  Substance and Sexual Activity   Alcohol use: Yes    Comment: occasional   Drug use: Not Currently    Types: Marijuana, Cocaine    Comment: Last use 04/11/20   Sexual activity: Not Currently    Birth control/protection: Surgical    Comment: Hysterectomy

## 2023-10-08 IMAGING — CR DG CHEST 2V
2 series · 2 of 2 positions shown · non-contrast
Comparison: Chest radiograph dated 09/21/2021.

CLINICAL DATA: Shortness of breath

EXAM:
CHEST - 2 VIEW

[chest lat]
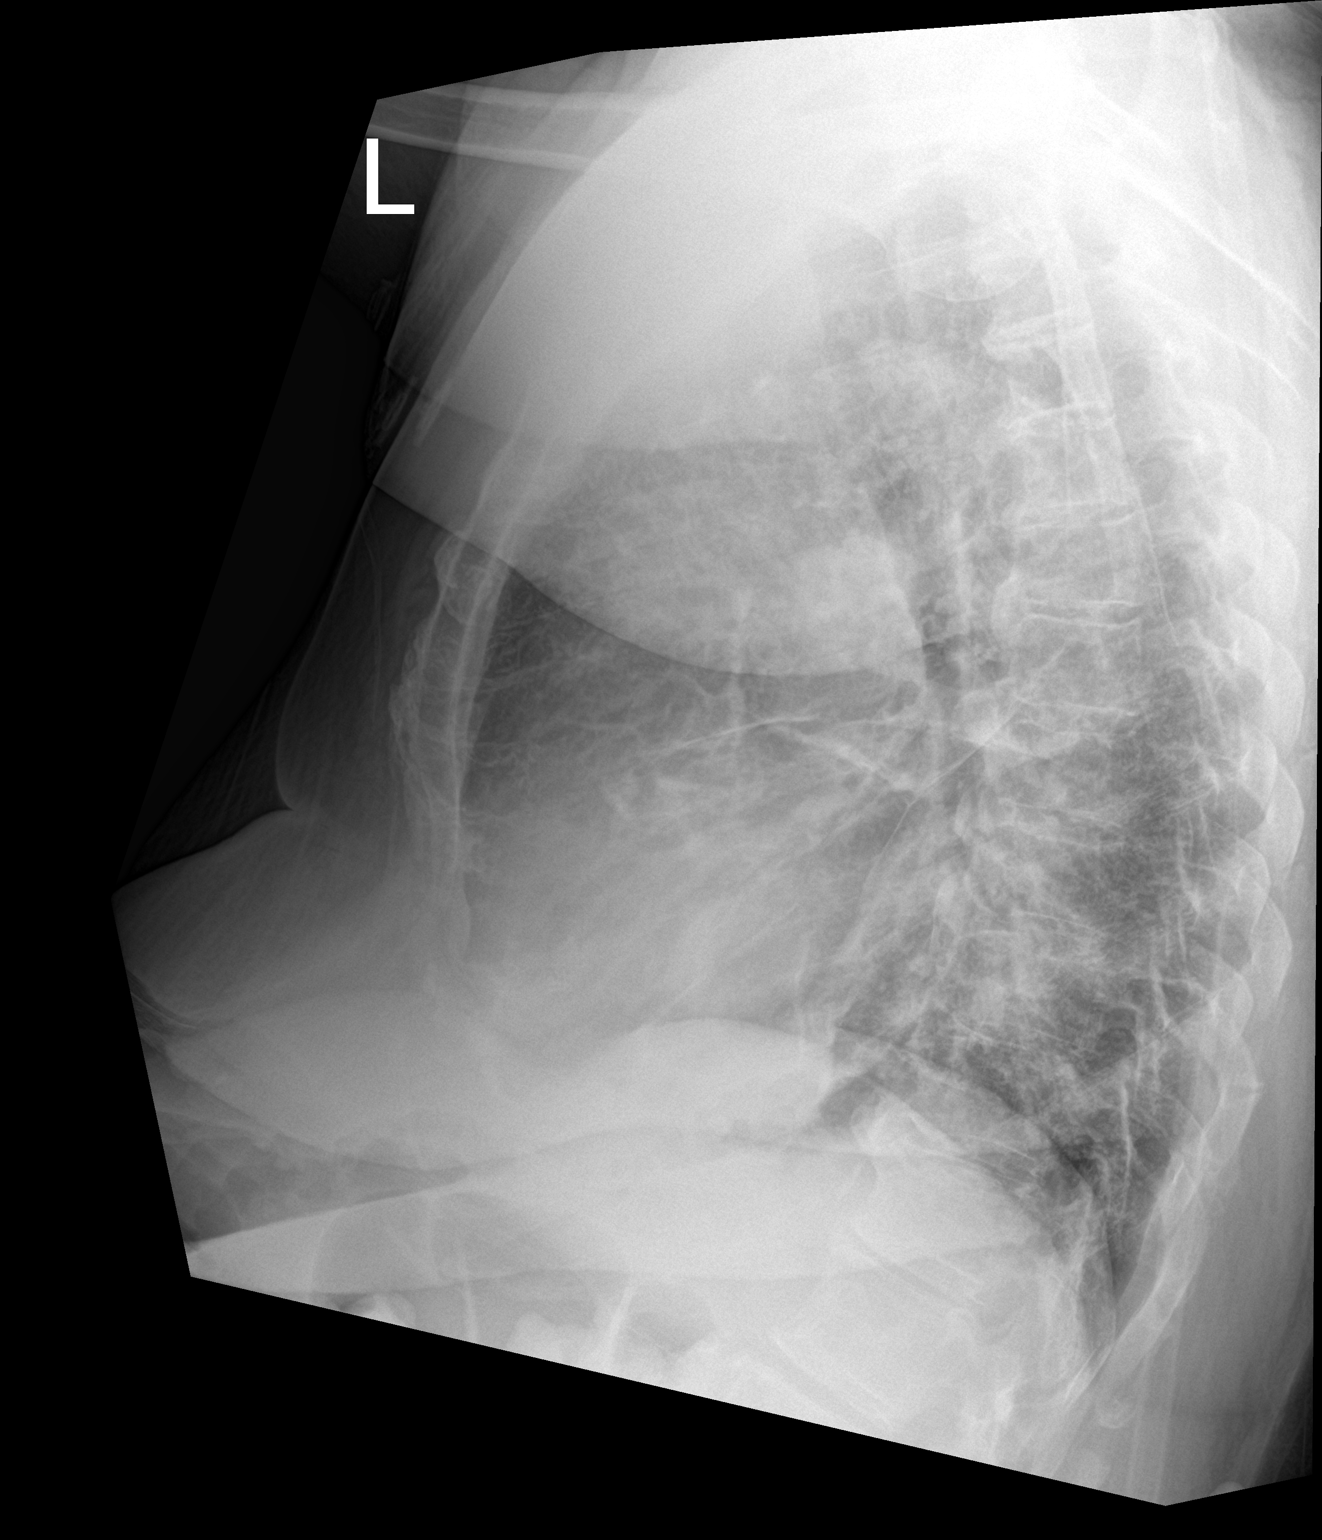

[chest ap]
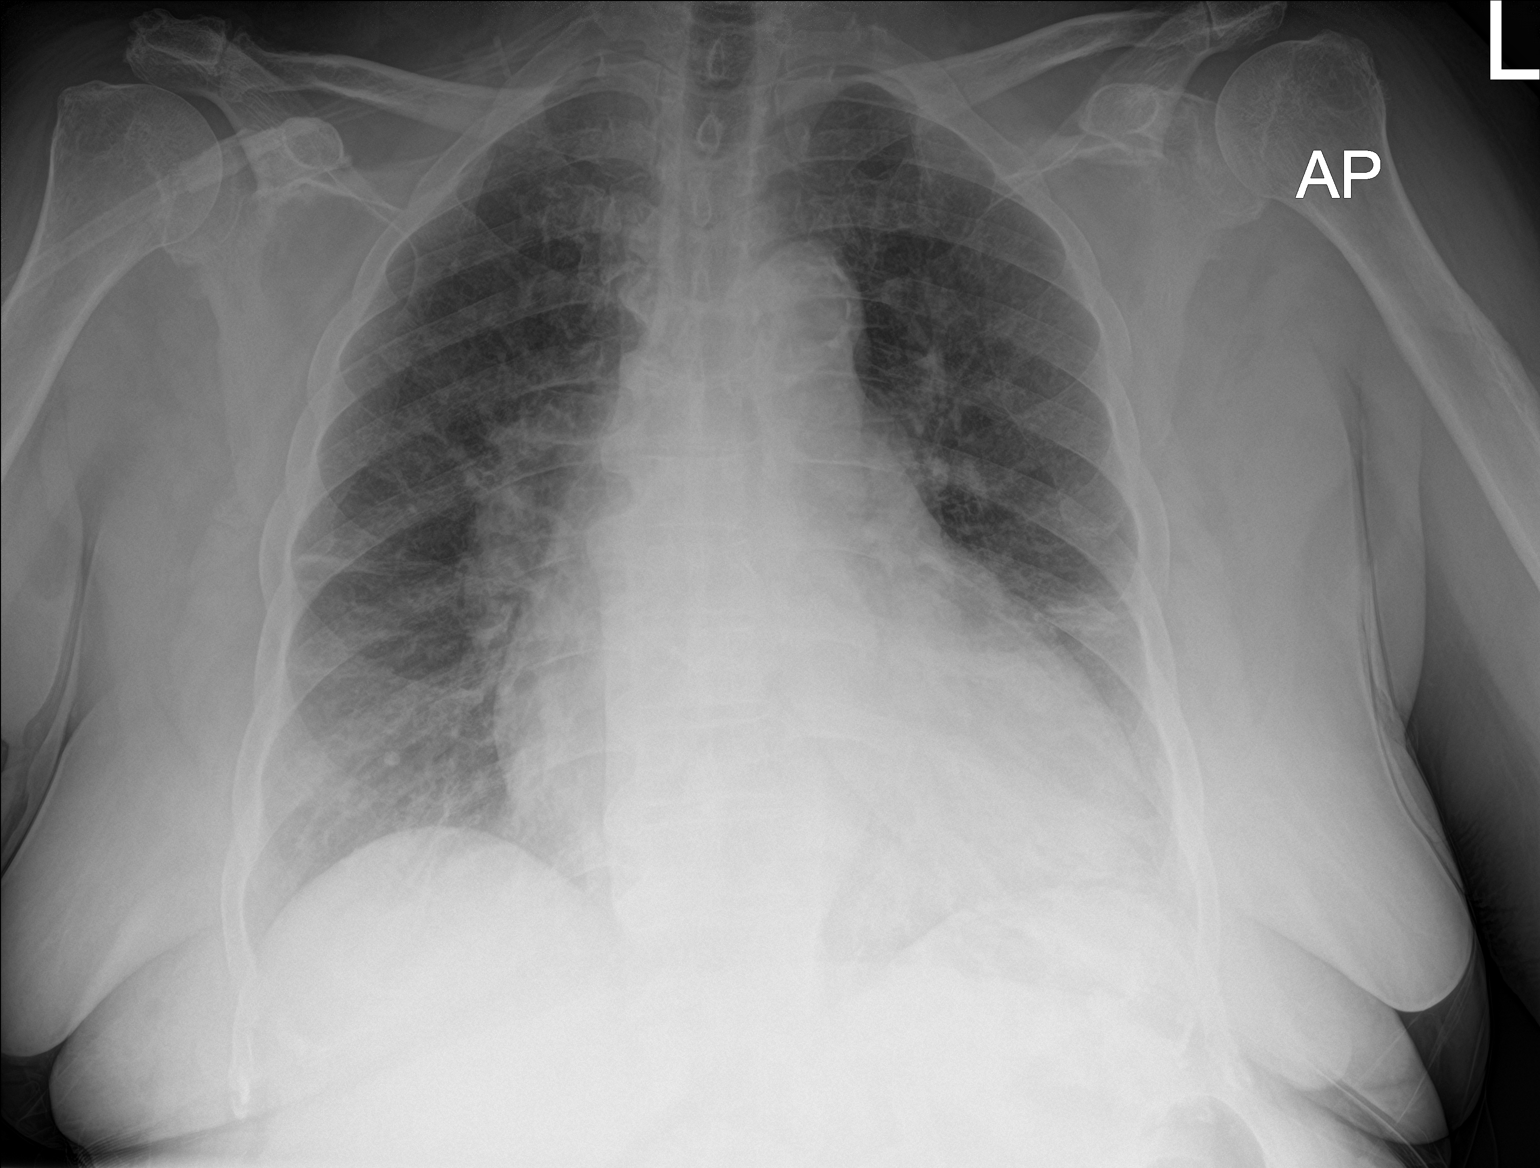

[2 of 2 positions shown; findings below may reference images not displayed]

FINDINGS: The heart is enlarged. Vascular calcifications are seen in the
aortic arch. Moderate bilateral lower lung predominant interstitial
opacities are noted. There is no pleural effusion or pneumothorax.
Degenerative changes are seen in the spine.
IMPRESSION: Cardiomegaly and bilateral interstitial opacities may represent
congestive heart failure.

Aortic Atherosclerosis ([id number].T).

## 2023-10-10 IMAGING — DX DG CHEST 1V PORT
1 series · 1 of 1 positions shown · non-contrast
Comparison: 11/07/2021

CLINICAL DATA: Shortness of breath, cough

EXAM:
PORTABLE CHEST 1 VIEW

[chest ap]
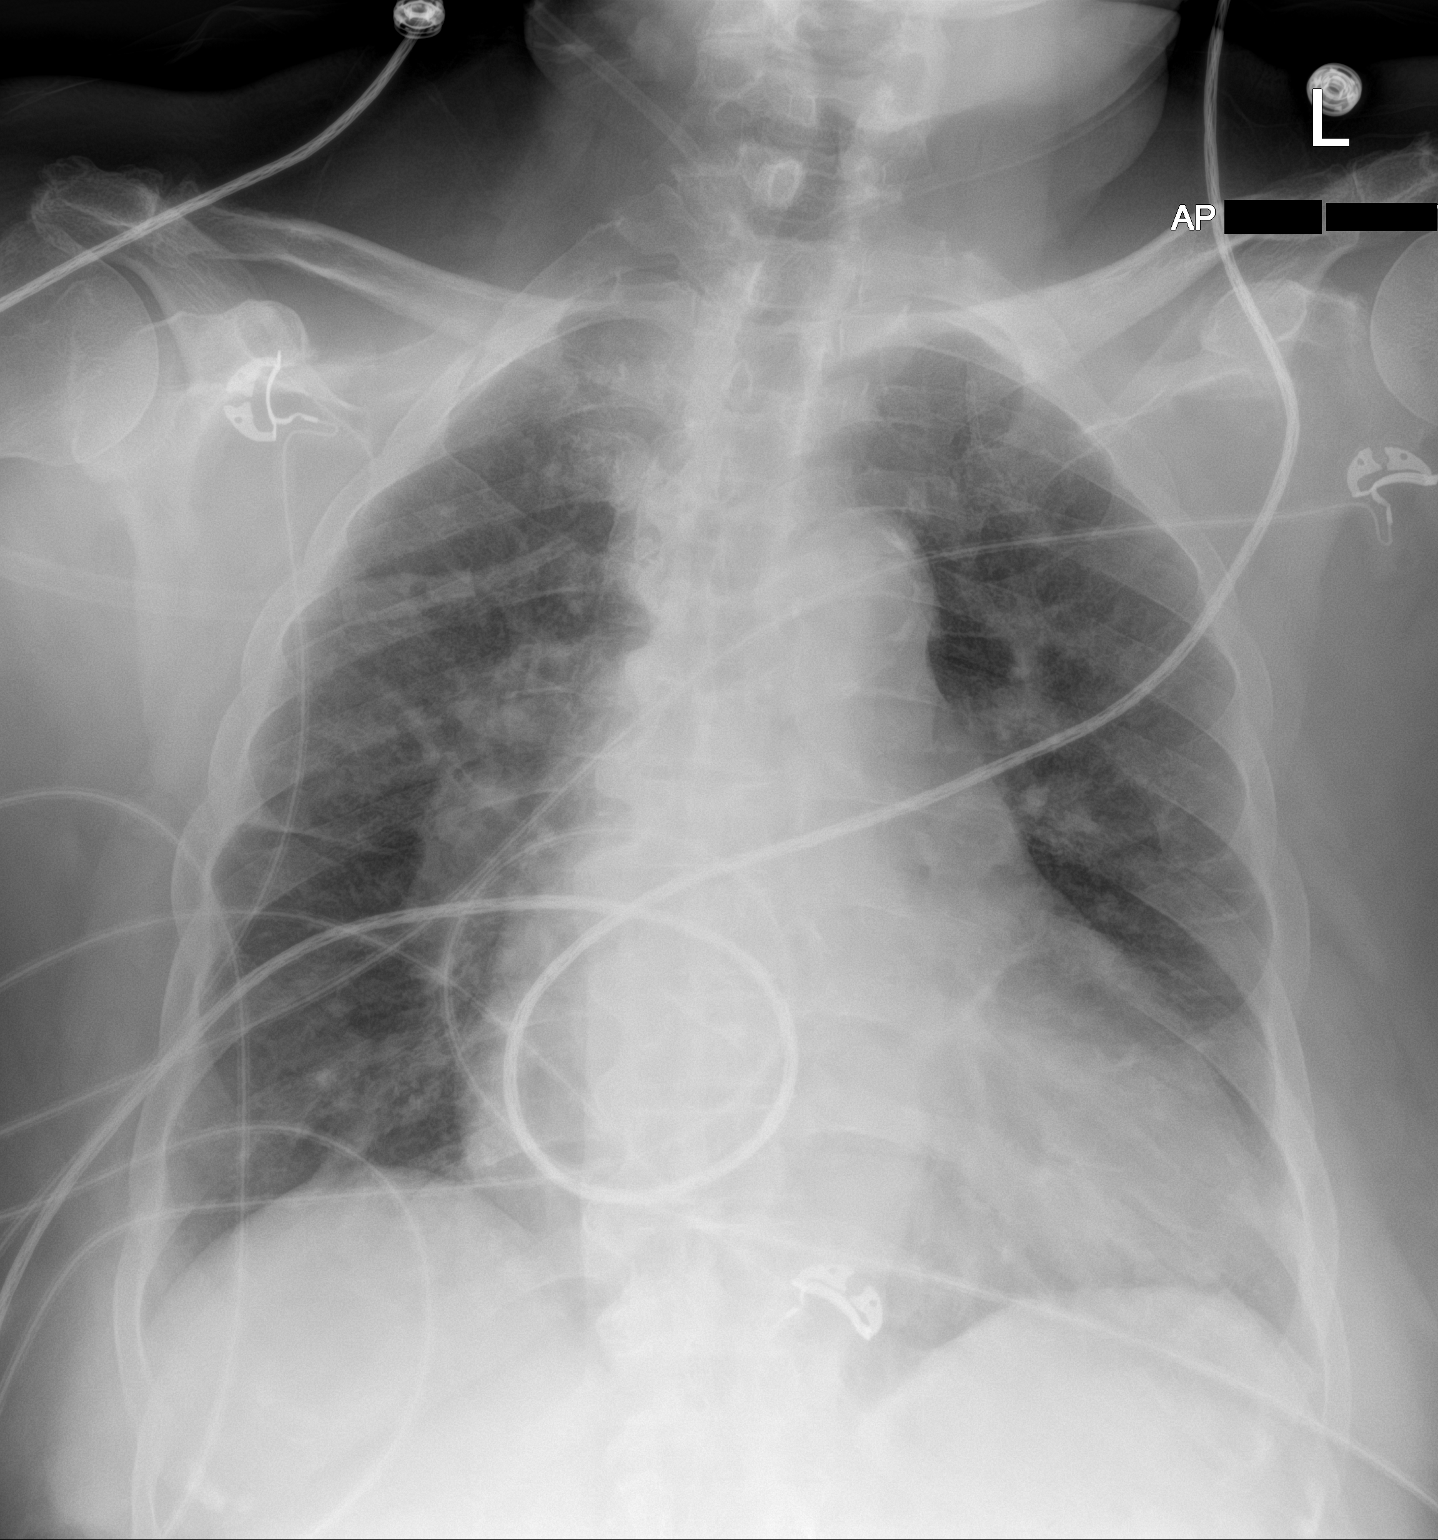

[1 of 1 positions shown; findings below may reference images not displayed]

FINDINGS: Cardiomegaly. No frank interstitial edema. No pleural effusion or
pneumothorax.

Thoracic aortic atherosclerosis.
IMPRESSION: Cardiomegaly. No frank interstitial edema.

## 2023-10-14 ENCOUNTER — Other Ambulatory Visit: Payer: Self-pay | Admitting: Cardiology

## 2023-10-14 DIAGNOSIS — I5032 Chronic diastolic (congestive) heart failure: Secondary | ICD-10-CM

## 2023-11-04 ENCOUNTER — Other Ambulatory Visit: Payer: Self-pay | Admitting: Family Medicine

## 2023-11-04 DIAGNOSIS — Z1231 Encounter for screening mammogram for malignant neoplasm of breast: Secondary | ICD-10-CM

## 2023-11-19 ENCOUNTER — Ambulatory Visit
Admission: RE | Admit: 2023-11-19 | Discharge: 2023-11-19 | Disposition: A | Discharge status: Home / Self Care | Source: Ambulatory Visit | Attending: Family Medicine | Admitting: Family Medicine

## 2023-11-19 DIAGNOSIS — Z1231 Encounter for screening mammogram for malignant neoplasm of breast: Secondary | ICD-10-CM

## 2023-11-26 ENCOUNTER — Ambulatory Visit: Attending: Cardiology | Admitting: Cardiology

## 2023-11-26 ENCOUNTER — Encounter: Payer: Self-pay | Admitting: Cardiology

## 2023-11-26 VITALS — BP 104/80 | HR 53 | Resp 16 | Ht 59.0 in | Wt 231.4 lb

## 2023-11-26 DIAGNOSIS — Z01810 Encounter for preprocedural cardiovascular examination: Secondary | ICD-10-CM

## 2023-11-26 DIAGNOSIS — I739 Peripheral vascular disease, unspecified: Secondary | ICD-10-CM

## 2023-11-26 DIAGNOSIS — I5032 Chronic diastolic (congestive) heart failure: Secondary | ICD-10-CM | POA: Diagnosis not present

## 2023-11-26 DIAGNOSIS — Z87891 Personal history of nicotine dependence: Secondary | ICD-10-CM

## 2023-11-26 DIAGNOSIS — I10 Essential (primary) hypertension: Secondary | ICD-10-CM

## 2023-11-26 DIAGNOSIS — I6523 Occlusion and stenosis of bilateral carotid arteries: Secondary | ICD-10-CM

## 2023-11-26 DIAGNOSIS — E119 Type 2 diabetes mellitus without complications: Secondary | ICD-10-CM

## 2023-11-26 DIAGNOSIS — I2510 Atherosclerotic heart disease of native coronary artery without angina pectoris: Secondary | ICD-10-CM | POA: Diagnosis not present

## 2023-11-26 DIAGNOSIS — E782 Mixed hyperlipidemia: Secondary | ICD-10-CM

## 2023-11-26 NOTE — Progress Notes (Signed)
 Cardiology Office Note:  .   Date:  11/26/2023  ID:  [name], DOB Feb 27, 1951, MRN [id number] PCP:  Brenita Callow, MD  Former Cardiology Providers: Zena High, APRN, FNP-C   HeartCare Providers Cardiologist:  Olinda Bertrand, DO , [facility] (established care 05/19/2023) Electrophysiologist:  None  Click to update primary MD,subspecialty MD or APP then REFRESH:1}    Chief Complaint  Patient presents with   Chronic heart failure with preserved ejection fraction   Follow-up    History of Present Illness: .   [name] is a 73 y.o.  female whose past medical history and cardiovascular risk factors includes: Established coronary artery disease without angina pectoris, hypertension, hyperlipidemia, former smoker, peripheral artery disease status post PV angiogram with revascularization of the SFA, non-insulin -dependent diabetes, postmenopausal female, advanced age, obesity due to excess calories, history of prior GI bleed (most recent 08/2022 2 PRBCs and IV iron ), COPD.   He has been followed by the practice for CAD, HFpEF, carotid disease.  Patient presents today for a 8-month follow-up visit.  HFpEF: Last hospitalization August 2024 exacerbated by running out of her medications. Since last office visit patient has not been hospitalized for congestive heart failure at [facility]. At the last office visit patient informed me that she did get evaluated for sleep apnea and was go pick up her machine.  And she also endorses medication compliance.  She was congratulated on her efforts.  Since last office visit, she denies anginal chest pain or heart failure symptoms.  Overall functional capacity is limited but she makes an effort to ambulate on a daily basis.  She has lost 3 pounds since last office visit.  She was recently on Ozempic but discontinued and is awaiting initiation of Mounjaro under the care of her providers at [facility]   Carotid  disease Last carotid duplex August 2024 noted some improvement in overall disease burden in the right ICA.  Reemphasized importance of improving her modifiable cardiovascular risk factors and medical therapy.  Reviewed the last progress note from vascular and vein in January 2025 they plan to follow-up with repeat carotid duplex.    PAD: Patient has undergone peripheral vascular interventions in the past.  She complains of bilateral lower extremity discomfort, predominantly infrapopliteal.  In the recent past she would describe discomfort in her legs which was felt to be pseudoclaudication as the discomfort/pain was started at her calcaneus and migrated proximally.  Patient states that she has had a thorough orthopedic workup as well.  Resting ABIs as of February 2023 were within normal limits.  Lower extremity arterial duplex were performed due to continued symptoms which noted disease in the right deep femoral artery but patent stents in the right SFA.  Since the last office visit she had an ankle-brachial index in January 2025 which noted mild right lower extremity disease and normal ABI in the left lower extremity.  Bilateral toe brachial indexes were reported to be abnormal.  She last saw Dr. Susi Eric from [facility] vein back in January 2025.  Who will continue to follow the patient longitudinally.   Review of Systems: .   Review of Systems  Cardiovascular:  Positive for dyspnea on exertion (chronic and stable.). Negative for chest pain, claudication, irregular heartbeat, leg swelling, near-syncope, orthopnea, palpitations, paroxysmal nocturnal dyspnea and syncope.  Hematologic/Lymphatic: Negative for bleeding problem.  Musculoskeletal:        Leg pain  Neurological:  Positive for paresthesias.    Studies  Reviewed:   EKG: 02/13/2023: Sinus bradycardia, 58 bpm, without underlying ischemia injury pattern.   EKG Interpretation Date/Time:  Wednesday November 26 2023 08:48:29 EDT Ventricular Rate:   54 PR Interval:  180 QRS Duration:  86 QT Interval:  452 QTC Calculation: 428 R Axis:   83  Text Interpretation: Sinus bradycardia When compared with ECG of 29-Jan-2023 13:55, No significant change was found Confirmed by Olinda Bertrand [phone]) on 11/26/2023 8:50:11 AM  Echocardiogram: 01/30/2023:  1. Left ventricular ejection fraction, by estimation, is 60 to 65%. The left ventricle has normal function. The left ventricle has no regional wall motion abnormalities. There is severe left ventricular hypertrophy. Left ventricular diastolic parameters   are consistent with Grade I diastolic dysfunction (impaired relaxation).   2. Right ventricular systolic function is mildly reduced. The right ventricular size is mildly enlarged. There is normal pulmonary artery systolic pressure.   3. Right atrial size was mildly dilated.   4. The mitral valve is grossly normal. Trivial mitral valve regurgitation. No evidence of mitral stenosis.   5. The aortic valve is tricuspid. There is moderate calcification of the aortic valve. Aortic valve regurgitation is not visualized. Mild aortic valve stenosis. Aortic valve area, by VTI measures 1.94 cm. Aortic valve mean gradient measures 10.3 mmHg. Aortic valve Vmax measures 2.14 m/s.   6. Aortic dilatation noted. There is mild dilatation of the ascending aorta, measuring 42 mm.   7. The inferior vena cava is normal in size with greater than 50% respiratory variability, suggesting right atrial pressure of 3 mmHg.    Stress Testing:  Lexiscan  Tetrofosmin  stress test 04/07/2022: Lexiscan  nuclear stress test performed using 1-day protocol. SPECT imaging showed very small, mild intensity, reversible apical perfusion defect. Normal wall motion and myocardial thickening. Stress LVEF 53%. Low risk study.    Heart Catheterization: Coronary angiogram 02/27/2014: Mid RCA 60-70% stenosis, mid LAD 60-70% stenosis. FFR to the LAD, lesion insignificant. Medical therapy for CAD.  Moderate aortic valve calcification.   Carotid artery duplex  08/05/2022: Duplex suggests stenosis in the right internal carotid artery (50-69%).  See report for additional details  August 2024: Right Carotid: Velocities in the right ICA are consistent with a 1-39% stenosis.  Left Carotid: Velocities in the left ICA are consistent with a 1-39% stenosis.   Peripheral arteriogram 02/11/2017: Successful PTA with DCB 5x150x2 InPact Admiral in the right proximal to distal SFA and stenting of proximal SFA with DES, [place] 6x100 mm stent. 3 vessel r/o. Left mild disease and 2 vessel R/O. Mild disease left by angiogram 10/05/2013.    VAS US  Aorta / IVC/Iliacs 08/17/2021 Calcifications noted throughout the abdominal aorta. There is no evidence of significant stenosis in the aortoiliac arteries, however, this is based on limited visualization.   ABI  08/17/2021 Right: Resting right ankle-brachial index is within normal range. No evidence of significant right lower extremity arterial disease. The right toe-brachial index is normal.   Left: Resting left ankle-brachial index is within normal range. No evidence of significant left lower extremity arterial disease. The left toe-brachial index is abnormal.   Lower extremity arterial duplex: 08/17/2021 Right: 50-74% stenosis noted in the deep femoral artery. Patent stent with no evidence of stenosis in the superficial femoral artery.  RADIOLOGY: N/A  Risk Assessment/Calculations:   NA   Labs:       Latest Ref Rng & Units 01/30/2023    4:21 AM 01/29/2023    2:47 PM 09/18/2022    1:27 PM  CBC  WBC  4.0 - 10.5 K/uL 6.6  6.0  9.7   Hemoglobin 12.0 - 15.0 g/dL 16.1  09.6  04.5   Hematocrit 36.0 - 46.0 % 42.7  42.4  40.0   Platelets 150 - 400 K/uL 390  344  489        Latest Ref Rng & Units 02/17/2023   11:44 AM 01/31/2023    1:32 AM 01/30/2023    4:21 AM  BMP  Glucose 70 - 99 mg/dL 409  811  914   BUN 8 - 27 mg/dL 14  12  7    Creatinine 0.57 -  1.00 mg/dL 7.82  9.56  2.13   BUN/Creat Ratio 12 - 28 18     Sodium 134 - 144 mmol/L 141  136  139   Potassium 3.5 - 5.2 mmol/L 4.8  3.6  3.4   Chloride 96 - 106 mmol/L 102  96  100   CO2 20 - 29 mmol/L 26  28  27    Calcium  8.7 - 10.3 mg/dL 9.3  9.0  9.1       Latest Ref Rng & Units 02/17/2023   11:44 AM 01/31/2023    1:32 AM 01/30/2023    4:21 AM  CMP  Glucose 70 - 99 mg/dL 086  578  469   BUN 8 - 27 mg/dL 14  12  7    Creatinine 0.57 - 1.00 mg/dL 6.29  5.28  4.13   Sodium 134 - 144 mmol/L 141  136  139   Potassium 3.5 - 5.2 mmol/L 4.8  3.6  3.4   Chloride 96 - 106 mmol/L 102  96  100   CO2 20 - 29 mmol/L 26  28  27    Calcium  8.7 - 10.3 mg/dL 9.3  9.0  9.1     Recent Labs    02/17/23 1132  PROBNP 40   No results for input(s): "TSH" in the last 8760 hours.   Physical Exam:    Today's Vitals   11/26/23 0845  BP: 104/80  Pulse: (!) 53  Resp: 16  SpO2: 93%  Weight: 231 lb 6.4 oz (105 kg)  Height: 4\' 11"  (1.499 m)    Body mass index is 46.74 kg/m. Wt Readings from Last 3 Encounters:  11/26/23 231 lb 6.4 oz (105 kg)  07/04/23 234 lb 4.8 oz (106.3 kg)  05/19/23 234 lb 3.2 oz (106.2 kg)    Physical Exam  Constitutional: No distress.  Age appropriate, hemodynamically stable.   Neck: No JVD present.  Cardiovascular: Regular rhythm, S1 normal, S2 normal and intact distal pulses. Bradycardia present. Exam reveals no gallop, no S3 and no S4.  No murmur heard. Pulses:      Femoral pulses are 1+ on the right side and 1+ on the left side.      Popliteal pulses are 0 on the right side and 0 on the left side.       Dorsalis pedis pulses are 2+ on the right side and 2+ on the left side.       Posterior tibial pulses are 0 on the right side and 0 on the left side.  Pulmonary/Chest: Effort normal and breath sounds normal. No stridor. She has no wheezes. She has no rales.  Abdominal: Soft. Bowel sounds are normal. She exhibits no distension. There is no abdominal tenderness.   Musculoskeletal:        General: No edema.     Cervical back: Neck supple.  Neurological: She is alert  and oriented to person, place, and time. She has intact cranial nerves (2-12).  Skin: Skin is warm and moist.     Impression & Recommendation(s):  Impression:   ICD-10-CM   1. Chronic heart failure with preserved ejection fraction (HFpEF) (HCC)  I50.32 EKG 12-Lead    2. Atherosclerosis of native coronary artery of native heart without angina pectoris  I25.10     3. Pre-procedural cardiovascular examination  Z01.810     4. PAD (peripheral artery disease) (HCC)  I73.9     5. Essential hypertension  I10     6. Atherosclerosis of both carotid arteries  I65.23     7. Non-insulin  dependent type 2 diabetes mellitus (HCC)  E11.9     8. Mixed hyperlipidemia  E78.2     9. Former smoker  Z87.891        Recommendation(s):  Chronic heart failure with preserved ejection fraction (HFpEF) (HCC) Stage C, NYHA class II Echo August 2024: LVEF 60 to 65%, grade 1 diastolic dysfunction. Has lost approximately 3 pounds in the last office visit. Awaiting the initiation of Mounjaro with her PCP. No hospitalizations for congestive heart failure since last office encounter. Continue Lasix  40 mg p.o. twice daily. Continue Toprol -XL 25 mg p.o. daily. Continue Entresto  24/26 mg p.o. twice daily. Continue spironolactone  25 mg p.o. every morning. Follows up with [facility]-trying to use her CPAP as much as possible.  Reemphasized its importance.  Atherosclerosis of native coronary artery of native heart without angina pectoris Denies angina or heart failure symptoms. No use of sublingual nitroglycerin  tablets since the last office visit.   Reemphasized the importance of secondary prevention with focus on improving her modifiable cardiovascular risk factors such as glycemic control, lipid management, blood pressure control, weight loss.    Preprocedural cardiovascular examination: Patient does  have a complex past medical history but quite optimized from a cardiovascular standpoint with most recent LVEF preserved at 60 to 65%, no recent hospitalizations for congestive heart failure, and today's EKG is nonischemic. Patient is being considered for colonoscopy with Dr. Nickey Barn to reevaluate colonic polyps and small bowel AVMs. According to the Revised Cardiac Risk Index (RCRI), her Perioperative Risk of Major Cardiac Event is (%): 6.6 Her Functional Capacity in METs is: 5.07 according to the Duke Activity Status Index (DASI). Patient is considered to be acceptable risk for upcoming noncardiac procedure. The procedure is not prohibitive. Recommend that the procedure be done in the hospital as opposed to a surgical center.  PAD (peripheral artery disease) (HCC) Has undergone peripheral vascular interventions in the past. Since last office visit she has establish care with Dr. Susi Eric, last progress note from January 2025 reviewed. Will defer management to vascular surgery as she has now established care with them.  Atherosclerosis of both carotid arteries Right ICA disease improved compared to the past. Continue antiplatelets and statin therapy. Reemphasized the importance of secondary prevention with focus on improving her modifiable cardiovascular risk factors such as glycemic control, lipid management, blood pressure control, weight loss. Will defer management to vascular surgery as she is now established care with them, last progress note from January 2025 reviewed.  Non-insulin  dependent type 2 diabetes mellitus (HCC) Reemphasized the importance of glycemic control.  Mixed hyperlipidemia Continue lipid-lowering agents. Lipids are currently being followed by PCP, per patient  Orders Placed:  Orders Placed This Encounter  Procedures   EKG 12-Lead   Final Medication List:   No orders of the defined types were placed in this encounter.  Medications Discontinued During This  Encounter  Medication Reason   aspirin  EC (ASPIRIN  LOW DOSE) 81 MG tablet Patient Preference     Current Outpatient Medications:    acetaminophen  (TYLENOL ) 500 MG tablet, Take 1,000 mg by mouth as needed for moderate pain., Disp: , Rfl:    albuterol  (PROVENTIL ) (2.5 MG/3ML) 0.083% nebulizer solution, Take 2.5 mg by nebulization in the morning, at noon, and at bedtime., Disp: , Rfl:    albuterol  (VENTOLIN  HFA) 108 (90 Base) MCG/ACT inhaler, Inhale 1-2 puffs into the lungs every 6 (six) hours as needed for wheezing or shortness of breath., Disp: 8.5 g, Rfl: 1   atorvastatin  (LIPITOR ) 80 MG tablet, Take 80 mg by mouth daily., Disp: , Rfl:    Cholecalciferol  (VITAMIN D -3 PO), Take 1 capsule by mouth 2 (two) times daily., Disp: , Rfl:    ezetimibe  (ZETIA ) 10 MG tablet, TAKE 1 TABLET BY MOUTH DAILY (Patient taking differently: Take 10 mg by mouth daily.), Disp: 90 tablet, Rfl: 3   ferrous sulfate  325 (65 FE) MG tablet, Take 325 mg by mouth daily., Disp: , Rfl:    fluticasone  (FLONASE ) 50 MCG/ACT nasal spray, Place 1 spray into both nostrils daily as needed for allergies., Disp: , Rfl:    Fluticasone -Umeclidin-Vilant (TRELEGY ELLIPTA ) 100-62.5-25 MCG/ACT AEPB, Inhale 1 puff into the lungs daily. (Patient taking differently: Inhale 2 puffs into the lungs daily.), Disp: 1 each, Rfl: 5   gabapentin  (NEURONTIN ) 600 MG tablet, Take 1 tablet (600 mg total) by mouth at bedtime. (Patient taking differently: Take 600 mg by mouth 3 (three) times daily.), Disp: 30 tablet, Rfl: 11   metoprolol  succinate (TOPROL -XL) 25 MG 24 hr tablet, TAKE 1 TABLET BY MOUTH EVERY DAY IN THE MORNING, Disp: 90 tablet, Rfl: 3   MOUNJARO 7.5 MG/0.5ML Pen, Inject 7.5 mg into the skin once a week., Disp: , Rfl:    nitroGLYCERIN  (NITROSTAT ) 0.4 MG SL tablet, Place 1 tablet (0.4 mg total) under the tongue every 5 (five) minutes as needed for chest pain., Disp: 90 tablet, Rfl: 3   octreotide  (SANDOSTATIN ) 100 MCG/ML SOLN injection, Inject 1  mL (100 mcg total) into the skin 3 (three) times daily., Disp: 90 mL, Rfl: 2   OXYGEN , Inhale 3 L into the lungs continuous., Disp: , Rfl:    pantoprazole  (PROTONIX ) 40 MG tablet, Take 1 tablet (40 mg total) by mouth 2 (two) times daily. (Patient taking differently: Take 40 mg by mouth daily.), Disp: 60 tablet, Rfl: 0   Potassium Chloride  ER 20 MEQ TBCR, Take 20 mEq by mouth daily., Disp: , Rfl:    sacubitril -valsartan  (ENTRESTO ) 24-26 MG, Take 1 tablet by mouth 2 (two) times daily., Disp: 60 tablet, Rfl: 2   spironolactone  (ALDACTONE ) 25 MG tablet, TAKE 1 TABLET BY MOUTH EVERY DAY IN THE MORNING, Disp: 90 tablet, Rfl: 2   SYRINGE-NEEDLE, DISP, 3 ML 25G X 5/8" 3 ML MISC, Inject 1 Syringe into the skin 3 (three) times daily. For use with Octreodide injection., Disp: 100 each, Rfl: 2   traMADol  (ULTRAM ) 50 MG tablet, Take 1 tablet (50 mg total) by mouth at bedtime as needed., Disp: 20 tablet, Rfl: 0   furosemide  (LASIX ) 40 MG tablet, Take 1 tablet (40 mg total) by mouth 2 (two) times daily. (Patient taking differently: Take 40 mg by mouth daily.), Disp: 60 tablet, Rfl: 0  Consent:   NA  Disposition:   6 months follow-up sooner if needed   Her questions and concerns were addressed to  her satisfaction. She voices understanding of the recommendations provided during this encounter.    Signed, Awilda Bogus, [facility]  A Division of [facility] [address] [place], [place] [address]  [place], [place] [address]  11/26/2023 6:30 PM

## 2023-11-26 NOTE — Patient Instructions (Signed)
 Medication Instructions:  None *If you need a refill on your cardiac medications before your next appointment, please call your pharmacy*  Lab Work: None If you have labs (blood work) drawn today and your tests are completely normal, you will receive your results only by: MyChart Message (if you have MyChart) OR A paper copy in the mail If you have any lab test that is abnormal or we need to change your treatment, we will call you to review the results.  Testing/Procedures: None  Follow-Up: At [facility], you and your health needs are our priority.  As part of our continuing mission to provide you with exceptional heart care, our providers are all part of one team.  This team includes your primary Cardiologist (physician) and Advanced Practice Providers or APPs (Physician Assistants and Nurse Practitioners) who all work together to provide you with the care you need, when you need it.  Your next appointment:   6 month(s)  Provider:   Olinda Bertrand, DO

## 2024-01-08 ENCOUNTER — Telehealth: Payer: Self-pay

## 2024-01-08 NOTE — Telephone Encounter (Signed)
 Patient was cleared by Dr. Michele on 11/26/2023 for procedure.  Will fax over office note to requesting office.

## 2024-01-08 NOTE — Telephone Encounter (Signed)
   Pre-operative Risk Assessment    Patient Name: [name]  DOB: Nov 04, 1950 MRN: [id number]   Date of last office visit: 11/26/23 MADONNA LARGE, DO Date of next office visit: NONE   Request for Surgical Clearance    Procedure:  COLONOSCOPY  Date of Surgery:  Clearance 01/22/24                                Surgeon:  DR ROLLIN Surgeon's Group or Practice Name:  [facility] Phone number:  [phone] Fax number:  [phone]   Type of Clearance Requested:   - Medical    Type of Anesthesia:  PROPOFOL    Additional requests/questions:    Signed, Lucie DELENA Ku   01/08/2024, 11:59 AM

## 2024-01-16 ENCOUNTER — Ambulatory Visit (INDEPENDENT_AMBULATORY_CARE_PROVIDER_SITE_OTHER): Admitting: Podiatry

## 2024-01-16 DIAGNOSIS — M7661 Achilles tendinitis, right leg: Secondary | ICD-10-CM | POA: Diagnosis not present

## 2024-01-16 NOTE — Progress Notes (Signed)
 Subjective:  Patient ID: Carla [name], female    DOB: [birth year],  MRN: [id number]  Chief Complaint  Patient presents with   Plantar Fasciitis    Pt stated that [name] right heel has been giving [name] trouble she stated that [name] primary doctor took blood and told [name] she had gout she stated that the medication is not helping     73 y.o. female presents with the above complaint.  Patient presents with right Achilles tendinitis and insertional pain.  Patient states been hurting [name] and she wanted get it evaluated she does have history of gout.  Pain scale 7 out of 10 dull aching nature hurts with ambulation hurts with pressure came out of nowhere.  She has tried some new shoes.   Review of Systems: Negative except as noted in the HPI. Denies N/V/F/Ch.  Past Medical History:  Diagnosis Date   Anemia 12/26/2015   Anxiety    Arthritis    Blood transfusion without reported diagnosis    CHF (congestive heart failure) (HCC)    Chronic pain    resolved per pt 04/12/20   COPD (chronic obstructive pulmonary disease) (HCC)    Coronary artery disease    Depression    Diabetes mellitus without complication (HCC)    type 2   GERD (gastroesophageal reflux disease)    Heart murmur    since birth; Echo 02/18/19: LVEF 68%, grade 1 diastolic dysfunction, mild AS (mean grad 12 mmHg), trace MR/PR, mild TR, PASP 32 mmHg     Hyperlipemia    Hypertension    PVD (peripheral vascular disease) (HCC)    right SFA stent 02/11/17 by Dr. Ladona   Sleep apnea    does not use cpap   Wears glasses     Current Outpatient Medications:    acetaminophen  (TYLENOL ) 500 MG tablet, Take 1,000 mg by mouth as needed for moderate pain., Disp: , Rfl:    albuterol  (PROVENTIL ) (2.5 MG/3ML) 0.083% nebulizer solution, Take 2.5 mg by nebulization in the morning, at noon, and at bedtime., Disp: , Rfl:    albuterol  (VENTOLIN  HFA) 108 (90 Base) MCG/ACT inhaler, Inhale 1-2 puffs into the lungs every 6 (six) hours as needed for  wheezing or shortness of breath., Disp: 8.5 g, Rfl: 1   atorvastatin  (LIPITOR ) 80 MG tablet, Take 80 mg by mouth daily., Disp: , Rfl:    Cholecalciferol  (VITAMIN D -3 PO), Take 1 capsule by mouth 2 (two) times daily., Disp: , Rfl:    ezetimibe  (ZETIA ) 10 MG tablet, TAKE 1 TABLET BY MOUTH DAILY (Patient taking differently: Take 10 mg by mouth daily.), Disp: 90 tablet, Rfl: 3   ferrous sulfate  325 (65 FE) MG tablet, Take 325 mg by mouth daily., Disp: , Rfl:    fluticasone  (FLONASE ) 50 MCG/ACT nasal spray, Place 1 spray into both nostrils daily as needed for allergies., Disp: , Rfl:    Fluticasone -Umeclidin-Vilant (TRELEGY ELLIPTA ) 100-62.5-25 MCG/ACT AEPB, Inhale 1 puff into the lungs daily. (Patient taking differently: Inhale 2 puffs into the lungs daily.), Disp: 1 each, Rfl: 5   furosemide  (LASIX ) 40 MG tablet, Take 1 tablet (40 mg total) by mouth 2 (two) times daily. (Patient taking differently: Take 40 mg by mouth daily.), Disp: 60 tablet, Rfl: 0   gabapentin  (NEURONTIN ) 600 MG tablet, Take 1 tablet (600 mg total) by mouth at bedtime. (Patient taking differently: Take 600 mg by mouth 3 (three) times daily.), Disp: 30 tablet, Rfl: 11   metoprolol  succinate (TOPROL -XL) 25 MG 24 hr  tablet, TAKE 1 TABLET BY MOUTH EVERY DAY IN THE MORNING, Disp: 90 tablet, Rfl: 3   MOUNJARO 7.5 MG/0.5ML Pen, Inject 7.5 mg into the skin once a week., Disp: , Rfl:    nitroGLYCERIN  (NITROSTAT ) 0.4 MG SL tablet, Place 1 tablet (0.4 mg total) under the tongue every 5 (five) minutes as needed for chest pain., Disp: 90 tablet, Rfl: 3   octreotide  (SANDOSTATIN ) 100 MCG/ML SOLN injection, Inject 1 mL (100 mcg total) into the skin 3 (three) times daily., Disp: 90 mL, Rfl: 2   OXYGEN , Inhale 3 L into the lungs continuous., Disp: , Rfl:    pantoprazole  (PROTONIX ) 40 MG tablet, Take 1 tablet (40 mg total) by mouth 2 (two) times daily. (Patient taking differently: Take 40 mg by mouth daily.), Disp: 60 tablet, Rfl: 0   Potassium  Chloride ER 20 MEQ TBCR, Take 20 mEq by mouth daily., Disp: , Rfl:    sacubitril -valsartan  (ENTRESTO ) 24-26 MG, Take 1 tablet by mouth 2 (two) times daily., Disp: 60 tablet, Rfl: 2   spironolactone  (ALDACTONE ) 25 MG tablet, TAKE 1 TABLET BY MOUTH EVERY DAY IN THE MORNING, Disp: 90 tablet, Rfl: 2   SYRINGE-NEEDLE, DISP, 3 ML 25G X 5/8 3 ML MISC, Inject 1 Syringe into the skin 3 (three) times daily. For use with Octreodide injection., Disp: 100 each, Rfl: 2   traMADol  (ULTRAM ) 50 MG tablet, Take 1 tablet (50 mg total) by mouth at bedtime as needed., Disp: 20 tablet, Rfl: 0  Social History   Tobacco Use  Smoking Status Former   Current packs/day: 0.00   Average packs/day: 0.6 packs/day for 54.8 years (32.9 ttl pk-yrs)   Types: Cigarettes   Start date: 08/18/1966   Quit date: 05/25/2021   Years since quitting: 2.6  Smokeless Tobacco Never    No Known Allergies Objective:  There were no vitals filed for this visit. There is no height or weight on file to calculate BMI. Constitutional Well developed. Well nourished.  Vascular Dorsalis pedis pulses palpable bilaterally. Posterior tibial pulses palpable bilaterally. Capillary refill normal to all digits.  No cyanosis or clubbing noted. Pedal hair growth normal.  Neurologic Normal speech. Oriented to person, place, and time. Epicritic sensation to light touch grossly present bilaterally.  Dermatologic Nails well groomed and normal in appearance. No open wounds. No skin lesions.  Orthopedic: Pain on palpation right Achilles tendon insertional pain positive Haglund's deformity noted positive Silfverskiold test noted with gastrocnemius equinus.  No pain at the posterior tibial tendon peroneal tendon ATFL ligament.   Radiographs: None none Assessment:   1. Right Achilles tendinitis    Plan:  Patient was evaluated and treated and all questions answered.  Right Achilles tendinitis - All questions and concerns were discussed with the  patient extensive detail given the amount of pain that she is having should benefit from cam boot immobilization Cam boot was dispensed - I will discuss steroid injection versus MRI during next visit if there is no improvement. - shoegear modifcation   No follow-ups on file.

## 2024-01-26 ENCOUNTER — Other Ambulatory Visit: Payer: Self-pay | Admitting: Gastroenterology

## 2024-02-04 ENCOUNTER — Encounter (HOSPITAL_COMMUNITY): Payer: Self-pay | Admitting: Gastroenterology

## 2024-02-04 ENCOUNTER — Other Ambulatory Visit: Payer: Self-pay | Admitting: Student

## 2024-02-04 ENCOUNTER — Ambulatory Visit
Admission: RE | Admit: 2024-02-04 | Discharge: 2024-02-04 | Disposition: A | Discharge status: Home / Self Care | Source: Ambulatory Visit | Attending: Student | Admitting: Student

## 2024-02-04 DIAGNOSIS — M722 Plantar fascial fibromatosis: Secondary | ICD-10-CM

## 2024-02-12 NOTE — Anesthesia Preprocedure Evaluation (Signed)
 Anesthesia Evaluation  Patient identified by MRN, date of birth, ID band Patient awake    Reviewed: Allergy & Precautions, NPO status , Patient's Chart, lab work & pertinent test results  History of Anesthesia Complications Negative for: history of anesthetic complications  Airway Mallampati: III  TM Distance: >3 FB Neck ROM: Full   Comment: Previous grade I view with Miller 2, easy mask Dental  (+) Dental Advisory Given, Missing, Chipped,    Pulmonary sleep apnea (no longer uses oxygen , just CPAP) and Continuous Positive Airway Pressure Ventilation , COPD (no recent inhaler use),  COPD inhaler, former smoker   Pulmonary exam normal breath sounds clear to auscultation       Cardiovascular hypertension (metoprolol ), Pt. on home beta blockers (-) angina + CAD, + Peripheral Vascular Disease and +CHF  (-) Past MI, (-) Cardiac Stents and (-) CABG (-) dysrhythmias + Valvular Problems/Murmurs (mild) AS  Rhythm:Regular Rate:Normal  HLD  TTE 01/30/2023: IMPRESSIONS    1. Left ventricular ejection fraction, by estimation, is 60 to 65%. The  left ventricle has normal function. The left ventricle has no regional  wall motion abnormalities. There is severe left ventricular hypertrophy.  Left ventricular diastolic parameters   are consistent with Grade I diastolic dysfunction (impaired relaxation).   2. Right ventricular systolic function is mildly reduced. The right  ventricular size is mildly enlarged. There is normal pulmonary artery  systolic pressure.   3. Right atrial size was mildly dilated.   4. The mitral valve is grossly normal. Trivial mitral valve  regurgitation. No evidence of mitral stenosis.   5. The aortic valve is tricuspid. There is moderate calcification of the  aortic valve. Aortic valve regurgitation is not visualized. Mild aortic  valve stenosis. Aortic valve area, by VTI measures 1.94 cm. Aortic valve  mean gradient  measures 10.3 mmHg.  Aortic valve Vmax measures 2.14 m/s.   6. Aortic dilatation noted. There is mild dilatation of the ascending  aorta, measuring 42 mm.   7. The inferior vena cava is normal in size with greater than 50%  respiratory variability, suggesting right atrial pressure of 3 mmHg.   Low-risk stress test 04/02/2022    Neuro/Psych neg Seizures PSYCHIATRIC DISORDERS Anxiety Depression    Chronic pain    GI/Hepatic Neg liver ROS, Bowel prep,GERD  Medicated,,  Endo/Other  diabetes, Type 2  Class 3 obesity  Renal/GU negative Renal ROS     Musculoskeletal  (+) Arthritis ,    Abdominal  (+) + obese  Peds  Hematology  (+) Blood dyscrasia, anemia Lab Results      Component                Value               Date                      WBC                      6.6                 01/30/2023                HGB                      13.7                01/30/2023  HCT                      42.7                01/30/2023                MCV                      88.2                01/30/2023                PLT                      390                 01/30/2023              Anesthesia Other Findings Last Mounjaro: 02/03/2024  Reproductive/Obstetrics                              Anesthesia Physical Anesthesia Plan  ASA: 3  Anesthesia Plan: MAC   Post-op Pain Management: Minimal or no pain anticipated   Induction: Intravenous  PONV Risk Score and Plan: 2 and Propofol  infusion, TIVA and Treatment may vary due to age or medical condition  Airway Management Planned: Natural Airway and Nasal Cannula  Additional Equipment:   Intra-op Plan:   Post-operative Plan:   Informed Consent: I have reviewed the patients History and Physical, chart, labs and discussed the procedure including the risks, benefits and alternatives for the proposed anesthesia with the patient or authorized representative who has indicated his/her understanding and  acceptance.     Dental advisory given  Plan Discussed with: Anesthesiologist and CRNA  Anesthesia Plan Comments: (Discussed with patient risks of MAC including, but not limited to, minor pain or discomfort, hearing people in the room, and possible need for backup general anesthesia. Risks for general anesthesia also discussed including, but not limited to, sore throat, hoarse voice, chipped/damaged teeth, injury to vocal cords, nausea and vomiting, allergic reactions, lung infection, heart attack, stroke, and death. All questions answered. )         Anesthesia Quick Evaluation

## 2024-02-13 ENCOUNTER — Encounter (HOSPITAL_COMMUNITY)
Admission: RE | Disposition: A | Discharge status: Home / Self Care | Payer: Self-pay | Source: Home / Self Care | Attending: Gastroenterology

## 2024-02-13 ENCOUNTER — Other Ambulatory Visit: Payer: Self-pay

## 2024-02-13 ENCOUNTER — Ambulatory Visit (HOSPITAL_COMMUNITY)
Admission: RE | Admit: 2024-02-13 | Discharge: 2024-02-13 | Disposition: A | Discharge status: Home / Self Care | Attending: Gastroenterology | Admitting: Gastroenterology

## 2024-02-13 ENCOUNTER — Ambulatory Visit (HOSPITAL_COMMUNITY): Payer: Self-pay | Admitting: Anesthesiology

## 2024-02-13 ENCOUNTER — Ambulatory Visit: Admitting: Podiatry

## 2024-02-13 ENCOUNTER — Encounter (HOSPITAL_COMMUNITY): Payer: Self-pay | Admitting: Anesthesiology

## 2024-02-13 DIAGNOSIS — D122 Benign neoplasm of ascending colon: Secondary | ICD-10-CM | POA: Diagnosis not present

## 2024-02-13 DIAGNOSIS — I110 Hypertensive heart disease with heart failure: Secondary | ICD-10-CM | POA: Diagnosis not present

## 2024-02-13 DIAGNOSIS — D509 Iron deficiency anemia, unspecified: Secondary | ICD-10-CM | POA: Insufficient documentation

## 2024-02-13 DIAGNOSIS — Z79899 Other long term (current) drug therapy: Secondary | ICD-10-CM | POA: Insufficient documentation

## 2024-02-13 DIAGNOSIS — I2510 Atherosclerotic heart disease of native coronary artery without angina pectoris: Secondary | ICD-10-CM

## 2024-02-13 DIAGNOSIS — K219 Gastro-esophageal reflux disease without esophagitis: Secondary | ICD-10-CM | POA: Insufficient documentation

## 2024-02-13 DIAGNOSIS — E66813 Obesity, class 3: Secondary | ICD-10-CM | POA: Diagnosis not present

## 2024-02-13 DIAGNOSIS — Z87891 Personal history of nicotine dependence: Secondary | ICD-10-CM | POA: Insufficient documentation

## 2024-02-13 DIAGNOSIS — K5730 Diverticulosis of large intestine without perforation or abscess without bleeding: Secondary | ICD-10-CM | POA: Insufficient documentation

## 2024-02-13 DIAGNOSIS — I509 Heart failure, unspecified: Secondary | ICD-10-CM | POA: Insufficient documentation

## 2024-02-13 DIAGNOSIS — G4730 Sleep apnea, unspecified: Secondary | ICD-10-CM | POA: Diagnosis not present

## 2024-02-13 DIAGNOSIS — E1151 Type 2 diabetes mellitus with diabetic peripheral angiopathy without gangrene: Secondary | ICD-10-CM | POA: Insufficient documentation

## 2024-02-13 DIAGNOSIS — J449 Chronic obstructive pulmonary disease, unspecified: Secondary | ICD-10-CM | POA: Insufficient documentation

## 2024-02-13 DIAGNOSIS — Z6841 Body Mass Index (BMI) 40.0 and over, adult: Secondary | ICD-10-CM | POA: Insufficient documentation

## 2024-02-13 DIAGNOSIS — K31819 Angiodysplasia of stomach and duodenum without bleeding: Secondary | ICD-10-CM | POA: Insufficient documentation

## 2024-02-13 HISTORY — PX: COLONOSCOPY: SHX5424

## 2024-02-13 HISTORY — PX: ESOPHAGOGASTRODUODENOSCOPY: SHX5428

## 2024-02-13 LAB — GLUCOSE, CAPILLARY
Glucose-Capillary: 89 mg/dL (ref 70–99)
Glucose-Capillary: 62 mg/dL — ABNORMAL LOW (ref 70–99)
Glucose-Capillary: 130 mg/dL — ABNORMAL HIGH (ref 70–99)
Glucose-Capillary: 54 mg/dL — ABNORMAL LOW (ref 70–99)
Glucose-Capillary: 55 mg/dL — ABNORMAL LOW (ref 70–99)

## 2024-02-13 SURGERY — COLONOSCOPY
Anesthesia: Monitor Anesthesia Care

## 2024-02-13 MED ORDER — SODIUM CHLORIDE 0.9 % IV SOLN: INTRAVENOUS | Status: DC | PRN

## 2024-02-13 MED ORDER — PROPOFOL 10 MG/ML IV BOLUS
INTRAVENOUS | Status: DC | PRN
  Administered 2024-02-13: 20 mg via INTRAVENOUS
  Administered 2024-02-13: 50 mg via INTRAVENOUS

## 2024-02-13 MED ORDER — DEXTROSE 50 % IV SOLN
12.5000 g | Freq: Once | INTRAVENOUS | Status: AC
  Administered 2024-02-13: 12.5 g via INTRAVENOUS

## 2024-02-13 MED ORDER — LIDOCAINE 2% (20 MG/ML) 5 ML SYRINGE
INTRAMUSCULAR | Status: DC | PRN
  Administered 2024-02-13: 80 mg via INTRAVENOUS

## 2024-02-13 MED ORDER — PROPOFOL 500 MG/50ML IV EMUL
INTRAVENOUS | Status: DC | PRN
Start: 2024-02-13 — End: 2024-02-13
  Administered 2024-02-13: 125 ug/kg/min via INTRAVENOUS

## 2024-02-13 MED ORDER — SODIUM CHLORIDE 0.9 % IV SOLN: INTRAVENOUS | Status: DC

## 2024-02-13 MED ORDER — DEXTROSE 50 % IV SOLN
INTRAVENOUS | Status: AC
  Filled 2024-02-13: qty 50

## 2024-02-13 NOTE — Progress Notes (Addendum)
 Hypoglycemic Event  CBG: 62  Treatment: 4 oz juice/soda, 8 oz juice, graham crackers and peanut butter, 12.5 g D50  Symptoms: None  Follow-up CBG: Time:0935 CBG Result: 55  Follow-up CBG: Time: 1004  CBG Result: 54  Follow-up CBG: Time: 1023 CBG Result: 130  Possible Reasons for Event: Inadequate meal intake  Comments/MD notified:MDA Peggye notified. Okay with soda and recheck in 15 minutes. After re-check, pt given orange juice and graham crackers. Pt denies Sx at this time.  At 1004, CBG was 54. MDA Allan notified. Pt states she does take her CBG at home, and that it normally runs in the 60s. VO for 12.5 g D50 IV. Medication given as ordered. Pt continues to deny symptoms.   Repeat CBG at 1023 was 130. MDA Allan notified, okay to discharge. Pt discharged to home.  Ozell VEAR Pouch 02/13/24 9:21 AM

## 2024-02-13 NOTE — H&P (Signed)
 [name] HPI: Her last HGB was at 13 g/dL on 06/7973 in Epic.  She states that she continues with TID octreotide  and did not have any further admissions to the hospital for anemia.  The patient reports having blood work with her PCP about 1.5 months ago.  Her colonoscopy on 09/2020 she was noted to have 6 adenomas and she was recommended to have a follow up colonoscopy in 3 years.  Over the interval time period she was referred over to [facility] for a double balloon enteroscopy, but she was declined as her cardiopulmonary status was poor.  This lead to the use of octreotide .  Since the last visit she remains stable with her overall poor health, but she was cleared by cardiology for the EGD/colonoscopy.  Past Medical History:  Diagnosis Date   Anemia 12/26/2015   Anxiety    Arthritis    Blood transfusion without reported diagnosis    CHF (congestive heart failure) (HCC)    Chronic pain    resolved per pt 04/12/20   COPD (chronic obstructive pulmonary disease) (HCC)    Coronary artery disease    Depression    Diabetes mellitus without complication (HCC)    type 2   GERD (gastroesophageal reflux disease)    Heart murmur    since birth; Echo 02/18/19: LVEF 68%, grade 1 diastolic dysfunction, mild AS (mean grad 12 mmHg), trace MR/PR, mild TR, PASP 32 mmHg     Hyperlipemia    Hypertension    PVD (peripheral vascular disease) (HCC)    right SFA stent 02/11/17 by Dr. Ladona   Sleep apnea    does not use cpap   Wears glasses     Past Surgical History:  Procedure Laterality Date   ABDOMINAL HYSTERECTOMY     CARDIAC CATHETERIZATION     CATARACT EXTRACTION     CESAREAN SECTION     COLONOSCOPY N/A 01/08/2013   Procedure: COLONOSCOPY;  Surgeon: Belvie JONETTA Just, MD;  Location: [facility] ENDOSCOPY;  Service: Endoscopy;  Laterality: N/A;   COLONOSCOPY N/A 12/28/2015   Procedure: COLONOSCOPY;  Surgeon: Belvie Just, MD;  Location: [facility] ENDOSCOPY;  Service: Endoscopy;  Laterality: N/A;   COLONOSCOPY WITH  PROPOFOL  N/A 10/05/2020   Procedure: COLONOSCOPY WITH PROPOFOL ;  Surgeon: Just Belvie, MD;  Location: [facility] ENDOSCOPY;  Service: Endoscopy;  Laterality: N/A;   ENTEROSCOPY N/A 12/28/2015   Procedure: ENTEROSCOPY;  Surgeon: Belvie Just, MD;  Location: [facility] ENDOSCOPY;  Service: Endoscopy;  Laterality: N/A;   ENTEROSCOPY N/A 02/20/2018   Procedure: ENTEROSCOPY;  Surgeon: Just Belvie, MD;  Location: [facility] ENDOSCOPY;  Service: Endoscopy;  Laterality: N/A;   ENTEROSCOPY N/A 03/27/2018   Procedure: ENTEROSCOPY;  Surgeon: Just Belvie, MD;  Location: [facility] ENDOSCOPY;  Service: Endoscopy;  Laterality: N/A;   ENTEROSCOPY N/A 10/05/2020   Procedure: ENTEROSCOPY;  Surgeon: Just Belvie, MD;  Location: [facility] ENDOSCOPY;  Service: Endoscopy;  Laterality: N/A;   ENTEROSCOPY N/A 05/11/2021   Procedure: ENTEROSCOPY;  Surgeon: Just Belvie, MD;  Location: [facility] ENDOSCOPY;  Service: Endoscopy;  Laterality: N/A;   ENTEROSCOPY N/A 09/23/2021   Procedure: ENTEROSCOPY;  Surgeon: Legrand Victory LITTIE DOUGLAS, MD;  Location: [facility] ENDOSCOPY;  Service: Gastroenterology;  Laterality: N/A;   ESOPHAGOGASTRODUODENOSCOPY N/A 01/08/2013   Procedure: ESOPHAGOGASTRODUODENOSCOPY (EGD);  Surgeon: Belvie JONETTA Just, MD;  Location: [facility];  Service: Endoscopy;  Laterality: N/A;   EYE SURGERY     GIVENS CAPSULE STUDY N/A 09/24/2021   Procedure: GIVENS CAPSULE STUDY;  Surgeon: Just Belvie, MD;  Location: [facility]  ENDOSCOPY;  Service: Gastroenterology;  Laterality: N/A;   HAND SURGERY     HEMOSTASIS CLIP PLACEMENT  05/11/2021   Procedure: HEMOSTASIS CLIP PLACEMENT;  Surgeon: Rollin Dover, MD;  Location: [facility] ENDOSCOPY;  Service: Endoscopy;;   HOT HEMOSTASIS N/A 12/28/2015   Procedure: HOT HEMOSTASIS (ARGON PLASMA COAGULATION/BICAP);  Surgeon: Dover Rollin, MD;  Location: [facility] ENDOSCOPY;  Service: Endoscopy;  Laterality: N/A;   HOT HEMOSTASIS N/A 02/20/2018   Procedure: HOT HEMOSTASIS (ARGON PLASMA COAGULATION/BICAP);  Surgeon: Rollin Dover, MD;  Location: [facility];   Service: Endoscopy;  Laterality: N/A;   HOT HEMOSTASIS N/A 03/27/2018   Procedure: HOT HEMOSTASIS (ARGON PLASMA COAGULATION/BICAP);  Surgeon: Rollin Dover, MD;  Location: [facility];  Service: Endoscopy;  Laterality: N/A;   HOT HEMOSTASIS N/A 10/05/2020   Procedure: HOT HEMOSTASIS (ARGON PLASMA COAGULATION/BICAP);  Surgeon: Rollin Dover, MD;  Location: [facility];  Service: Endoscopy;  Laterality: N/A;   HOT HEMOSTASIS N/A 05/11/2021   Procedure: HOT HEMOSTASIS (ARGON PLASMA COAGULATION/BICAP);  Surgeon: Rollin Dover, MD;  Location: [facility];  Service: Endoscopy;  Laterality: N/A;   LEFT HEART CATHETERIZATION WITH CORONARY ANGIOGRAM N/A 03/09/2013   Procedure: LEFT HEART CATHETERIZATION WITH CORONARY ANGIOGRAM;  Surgeon: Erick JONELLE Bergamo, MD;  Location: [facility] CATH LAB;  Service: Cardiovascular;  Laterality: N/A;   LOWER EXTREMITY ANGIOGRAM N/A 12/29/2012   Procedure: LOWER EXTREMITY ANGIOGRAM;  Surgeon: Erick JONELLE Bergamo, MD;  Location: [facility] CATH LAB;  Service: Cardiovascular;  Laterality: N/A;   LOWER EXTREMITY ANGIOGRAM N/A 10/05/2013   Procedure: LOWER EXTREMITY ANGIOGRAM;  Surgeon: Erick JONELLE Bergamo, MD;  Location[facility] CATH LAB;  Service: Cardiovascular;  Laterality: N/A;   LOWER EXTREMITY ANGIOGRAPHY N/A 02/11/2017   Procedure: Lower Extremity Angiography;  Surgeon: Bergamo Heinz, MD;  Location: [facility] INVASIVE CV LAB;  Service: Cardiovascular;  Laterality: N/A;   PERIPHERAL VASCULAR BALLOON ANGIOPLASTY  02/11/2017   Procedure: PERIPHERAL VASCULAR BALLOON ANGIOPLASTY;  Surgeon: Bergamo Heinz, MD;  Location: [facility] INVASIVE CV LAB;  Service: Cardiovascular;;  Right SFA   PERIPHERAL VASCULAR INTERVENTION Right 02/11/2017   Procedure: PERIPHERAL VASCULAR INTERVENTION;  Surgeon: Bergamo Heinz, MD;  Location: [facility] INVASIVE CV LAB;  Service: Cardiovascular;  Laterality: Right;  Rt SFA   POLYPECTOMY  10/05/2020   Procedure: POLYPECTOMY;  Surgeon: Rollin Dover, MD;  Location: [facility] ENDOSCOPY;  Service: Endoscopy;;   stent  in right leg  12/29/2012   for blood clot, Dr. Margaretann   TOTAL KNEE ARTHROPLASTY Left 04/18/2020   TOTAL KNEE ARTHROPLASTY Left 04/18/2020   Procedure: LEFT TOTAL KNEE ARTHROPLASTY;  Surgeon: Addie Cordella Hamilton, MD;  Location: [facility] OR;  Service: Orthopedics;  Laterality: Left;   TYMPANOMASTOIDECTOMY Left 03/08/2014   Procedure: TYMPANOMASTOIDECTOMY LEFT;  Surgeon: Ana LELON Moccasin, MD;  Location: [facility];  Service: ENT;  Laterality: Left;   TYMPANOMASTOIDECTOMY  2015   UTERINE FIBROID SURGERY      Family History  Problem Relation Age of Onset   Diabetes Mother    Hypertension Mother    Heart disease Mother    Heart disease Father    Hypertension Father    Hypertension Sister    Hypertension Brother    Diabetes Brother    Hypertension Sister    Hypertension Brother    Diabetes Brother    Hypertension Brother    Hypertension Brother    Hypertension Brother     Social History:  reports that she quit smoking about 2 years ago. Her smoking use included cigarettes. She started smoking about 57 years ago. She  has a 32.9 pack-year smoking history. She has never used smokeless tobacco. She reports current alcohol  use. She reports that she does not currently use drugs after having used the following drugs: Marijuana and Cocaine.  Allergies: No Known Allergies  Medications: Scheduled: Continuous:  sodium chloride      sodium chloride       Results for orders placed or performed during the hospital encounter of 02/13/24 (from the past 24 hours)  Glucose, capillary     Status: None   Collection Time: 02/13/24  7:21 AM  Result Value Ref Range   Glucose-Capillary 89 70 - 99 mg/dL     No results found.  ROS:  As stated above in the HPI otherwise negative.  Blood pressure (!) 144/77, pulse (!) 59, temperature (!) 97.4 F (36.3 C), temperature source Temporal, resp. rate 11, height 4' 11 (1.499 m), weight 100.8 kg, SpO2 97%.    PE: Gen: NAD, Alert and Oriented HEENT:  [place]/AT,  EOMI Neck: Supple, no LAD Lungs: CTA Bilaterally CV: RRR without M/G/R ABD: Soft, NTND, +BS Ext: No C/C/E  Assessment/Plan: 1) IDA - EGD/colonoscopy. 2) Personal history of polyps.  Nihaal Friesen D 02/13/2024, 7:24 AM

## 2024-02-13 NOTE — Op Note (Addendum)
 [facility] Patient Name: [name] Procedure Date: 02/13/2024 MRN: [id number] Attending MD: Belvie Just , MD, [id number] Date of Birth: Jun 27, 1950 CSN: [id number] Age: 73 Admit Type: Outpatient Procedure:                Colonoscopy Indications:              High risk colon cancer surveillance: Personal                            history of colonic polyps Providers:                Belvie Just, MD, Jacquelyn Jaci Pierce, RN,                            Curtistine Bishop, Technician Referring MD:              Medicines:                Propofol  per Anesthesia Complications:            No immediate complications. Estimated Blood Loss:     Estimated blood loss: none. Procedure:                Pre-Anesthesia Assessment:                           - Prior to the procedure, a History and Physical                            was performed, and patient medications and                            allergies were reviewed. The patient's tolerance of                            previous anesthesia was also reviewed. The risks                            and benefits of the procedure and the sedation                            options and risks were discussed with the patient.                            All questions were answered, and informed consent                            was obtained. Prior Anticoagulants: The patient has                            taken no anticoagulant or antiplatelet agents. ASA                            Grade Assessment: III - A patient with severe  systemic disease. After reviewing the risks and                            benefits, the patient was deemed in satisfactory                            condition to undergo the procedure.                           - Sedation was administered by an anesthesia                            professional. Deep sedation was attained.                           After obtaining informed  consent, the colonoscope                            was passed under direct vision. Throughout the                            procedure, the patient's blood pressure, pulse, and                            oxygen  saturations were monitored continuously. The                            PCF-HQ190DL ([id number]) Olympus colonoscope was                            introduced through the anus and advanced to the the                            cecum, identified by appendiceal orifice and                            ileocecal valve. The colonoscopy was performed                            without difficulty. The patient tolerated the                            procedure well. The quality of the bowel                            preparation was evaluated using the BBPS [facility]                            Bowel Preparation Scale) with scores of: Right                            Colon = 3 (entire mucosa seen well with no residual  staining, small fragments of stool or opaque                            liquid), Transverse Colon = 2 (minor amount of                            residual staining, small fragments of stool and/or                            opaque liquid, but mucosa seen well) and Left Colon                            = 2 (minor amount of residual staining, small                            fragments of stool and/or opaque liquid, but mucosa                            seen well). The total BBPS score equals 7. The                            quality of the bowel preparation was good. The                            ileocecal valve, appendiceal orifice, and rectum                            were photographed. Scope In: 8:32:32 AM Scope Out: 8:48:52 AM Scope Withdrawal Time: 0 hours 12 minutes 32 seconds  Total Procedure Duration: 0 hours 16 minutes 20 seconds  Findings:      Five sessile polyps were found in the ascending colon. The polyps were 2       to 3 mm in size. These  polyps were removed with a cold snare. Resection       and retrieval were complete.      Scattered medium-mouthed and small-mouthed diverticula were found in the       sigmoid colon. Impression:               - Five 2 to 3 mm polyps in the ascending colon,                            removed with a cold snare. Resected and retrieved.                           - Diverticulosis in the sigmoid colon. Moderate Sedation:      Not Applicable - Patient had care per Anesthesia. Recommendation:           - Patient has a contact number available for                            emergencies. The signs and symptoms of potential  delayed complications were discussed with the                            patient. Return to normal activities tomorrow.                            Written discharge instructions were provided to the                            patient.                           - Resume previous diet.                           - Continue present medications.                           - Await pathology results.                           - Repeat colonoscopy is not recommended for                            surveillanc with her severe comorbidities. Procedure Code(s):        --- Professional ---                           [phone], Colonoscopy, flexible; with removal of                            tumor(s), polyp(s), or other lesion(s) by snare                            technique Diagnosis Code(s):        --- Professional ---                           Z86.010, Personal history of colonic polyps                           D12.2, Benign neoplasm of ascending colon                           K57.30, Diverticulosis of large intestine without                            perforation or abscess without bleeding CPT copyright 2022 American Medical Association. All rights reserved. The codes documented in this report are preliminary and upon coder review may  be revised to meet current  compliance requirements. Belvie Just, MD Belvie Just, MD 02/13/2024 8:58:10 AM This report has been signed electronically. Number of Addenda: 0

## 2024-02-13 NOTE — Transfer of Care (Signed)
 Immediate Anesthesia Transfer of Care Note  Patient: [name]  Procedure(s) Performed: COLONOSCOPY EGD (ESOPHAGOGASTRODUODENOSCOPY)  Patient Location: PACU  Anesthesia Type:MAC  Level of Consciousness: awake and alert   Airway & Oxygen  Therapy: Patient Spontanous Breathing and Patient connected to nasal cannula oxygen   Post-op Assessment: Report given to RN and Post -op Vital signs reviewed and stable  Post vital signs: Reviewed and stable  Last Vitals:  Vitals Value Taken Time  BP 148/64 02/13/24 08:55  Temp    Pulse 66 02/13/24 08:59  Resp 20 02/13/24 08:59  SpO2 94 % 02/13/24 08:59  Vitals shown include unfiled device data.  Last Pain:  Vitals:   02/13/24 0720  TempSrc: Temporal  PainSc: 0-No pain         Complications: No notable events documented.

## 2024-02-13 NOTE — Anesthesia Postprocedure Evaluation (Signed)
 Anesthesia Post Note  Patient: [name]  Procedure(s) Performed: COLONOSCOPY EGD (ESOPHAGOGASTRODUODENOSCOPY)     Patient location during evaluation: PACU Anesthesia Type: MAC Level of consciousness: awake Pain management: pain level controlled Vital Signs Assessment: post-procedure vital signs reviewed and stable Respiratory status: spontaneous breathing, nonlabored ventilation and respiratory function stable Cardiovascular status: stable and blood pressure returned to baseline Postop Assessment: no apparent nausea or vomiting Anesthetic complications: no   No notable events documented.  Last Vitals:  Vitals:   02/13/24 0930 02/13/24 0950  BP:  (!) 181/90  Pulse:  (!) 51  Resp:  20  Temp:    SpO2: 94% 92%    Last Pain:  Vitals:   02/13/24 0950  TempSrc:   PainSc: 0-No pain                 Delon Aisha Arch

## 2024-02-13 NOTE — Discharge Instructions (Signed)
YOU HAD AN ENDOSCOPIC PROCEDURE TODAY: Refer to the procedure report and other information in the discharge instructions given to you for any specific questions about what was found during the examination. If this information does not answer your questions, please call [facility] at [phone] to clarify.   YOU SHOULD EXPECT: Some feelings of bloating in the abdomen. Passage of more gas than usual. Walking can help get rid of the air that was put into your GI tract during the procedure and reduce the bloating. If you had a lower endoscopy (such as a colonoscopy or flexible sigmoidoscopy) you may notice spotting of blood in your stool or on the toilet paper. Some abdominal soreness may be present for a day or two, also.  DIET: Your first meal following the procedure should be a light meal and then it is ok to progress to your normal diet. A half-sandwich or bowl of soup is an example of a good first meal. Heavy or fried foods are harder to digest and may make you feel nauseous or bloated. Drink plenty of fluids but you should avoid alcoholic beverages for 24 hours.  ACTIVITY: Your care partner should take you home directly after the procedure. You should plan to take it easy, moving slowly for the rest of the day. You can resume normal activity the day after the procedure however YOU SHOULD NOT DRIVE, use power tools, machinery or perform tasks that involve climbing or major physical exertion for 24 hours (because of the sedation medicines used during the test).   SYMPTOMS TO REPORT IMMEDIATELY: A gastroenterologist can be reached at any hour. Please call [phone]  for any of the following symptoms:  Following lower endoscopy (colonoscopy, flexible sigmoidoscopy) Excessive amounts of blood in the stool  Significant tenderness, worsening of abdominal pains  Swelling of the abdomen that is new, acute  Fever of 100 or higher  Following upper endoscopy (EGD, EUS, ERCP, esophageal  dilation) Vomiting of blood or coffee ground material  New, significant abdominal pain  New, significant chest pain or pain under the shoulder blades  Painful or persistently difficult swallowing  New shortness of breath  Black, tarry-looking or red, bloody stools  FOLLOW UP:  If any biopsies were taken you will be contacted by phone or by letter within the next 1-3 weeks. Call [phone]  if you have not heard about the biopsies in 3 weeks.  Please also call with any specific questions about appointments or follow up tests.

## 2024-02-13 NOTE — Op Note (Signed)
 [facility] Patient Name: [name] Procedure Date: 02/13/2024 MRN: [id number] Attending MD: Belvie Just , MD, [id number] Date of Birth: 1951-01-08 CSN: [id number] Age: 73 Admit Type: Outpatient Procedure:                Upper GI endoscopy Indications:              Iron  deficiency anemia Providers:                Belvie Just, MD, Jacquelyn Jaci Pierce, RN,                            Curtistine Bishop, Technician Referring MD:              Medicines:                Propofol  per Anesthesia Complications:            No immediate complications. Estimated Blood Loss:     Estimated blood loss: none. Procedure:                Pre-Anesthesia Assessment:                           - Prior to the procedure, a History and Physical                            was performed, and patient medications and                            allergies were reviewed. The patient's tolerance of                            previous anesthesia was also reviewed. The risks                            and benefits of the procedure and the sedation                            options and risks were discussed with the patient.                            All questions were answered, and informed consent                            was obtained. Prior Anticoagulants: The patient has                            taken no anticoagulant or antiplatelet agents. ASA                            Grade Assessment: III - A patient with severe                            systemic disease. After reviewing the risks and  benefits, the patient was deemed in satisfactory                            condition to undergo the procedure.                           - Sedation was administered by an anesthesia                            professional. Deep sedation was attained.                           After obtaining informed consent, the endoscope was                            passed under direct  vision. Throughout the                            procedure, the patient's blood pressure, pulse, and                            oxygen  saturations were monitored continuously. The                            PCF-HQ190DL ([id number]) Olympus colonoscope was                            introduced through the mouth, and advanced to the                            fourth part of duodenum. The upper GI endoscopy was                            accomplished without difficulty. The patient                            tolerated the procedure well. Scope In: 8:31:26 AM Scope Out: 8:31:56 AM Total Procedure Duration: 0 hours 0 minutes 30 seconds  Findings:      The esophagus was normal.      The stomach was normal.      Multiple small angiodysplastic lesions without bleeding were found in       the second portion of the duodenum. Coagulation for tissue destruction       using monopolar probe was successful. Estimated blood loss: none. Impression:               - Normal esophagus.                           - Normal stomach.                           - Multiple non-bleeding angiodysplastic lesions in                            the duodenum. Treated with a monopolar probe.                           -  No specimens collected. Moderate Sedation:      Not Applicable - Patient had care per Anesthesia. Recommendation:           - Patient has a contact number available for                            emergencies. The signs and symptoms of potential                            delayed complications were discussed with the                            patient. Return to normal activities tomorrow.                            Written discharge instructions were provided to the                            patient.                           - Resume previous diet.                           - Continue present medications.                           - Maintain octreotide .                           - Follow up in the office  in 6 months. Procedure Code(s):        --- Professional ---                           [phone], Esophagogastroduodenoscopy, flexible,                            transoral; with ablation of tumor(s), polyp(s), or                            other lesion(s) (includes pre- and post-dilation                            and guide wire passage, when performed) Diagnosis Code(s):        --- Professional ---                           X68.180, Angiodysplasia of stomach and duodenum                            without bleeding                           D50.9, Iron  deficiency anemia, unspecified CPT copyright 2022 American Medical Association. All rights reserved. The codes documented in this report are preliminary and upon coder review may  be revised to meet current compliance requirements. Belvie  Rollin, MD Belvie Rollin, MD 02/13/2024 8:54:57 AM This report has been signed electronically. Number of Addenda: 0

## 2024-02-15 ENCOUNTER — Encounter (HOSPITAL_COMMUNITY): Payer: Self-pay | Admitting: Gastroenterology

## 2024-02-16 LAB — SURGICAL PATHOLOGY

## 2024-02-25 ENCOUNTER — Ambulatory Visit (INDEPENDENT_AMBULATORY_CARE_PROVIDER_SITE_OTHER): Admitting: Podiatry

## 2024-02-25 DIAGNOSIS — M7661 Achilles tendinitis, right leg: Secondary | ICD-10-CM | POA: Diagnosis not present

## 2024-02-25 DIAGNOSIS — M9261 Juvenile osteochondrosis of tarsus, right ankle: Secondary | ICD-10-CM

## 2024-02-25 NOTE — Progress Notes (Signed)
 Subjective:  Patient ID: [name], female    DOB: 1950/12/30,  MRN: [id number]  Chief Complaint  Patient presents with   Foot Pain    Rt foot achilles tendinitis follow up pt stated that she is doing better than she was she stated that she does not have any major pain or discomfort just some slight tenderness     73 y.o. female presents with the above complaint.  Patient presents with right Achilles tendinitis and insertional pain.  Patient states been hurting [name] and she wanted get it evaluated she does have history of gout.  Pain scale 7 out of 10 dull aching nature hurts with ambulation hurts with pressure came out of nowhere.  She has tried some new shoes.   Review of Systems: Negative except as noted in the HPI. Denies N/V/F/Ch.  Past Medical History:  Diagnosis Date   Anemia 12/26/2015   Anxiety    Arthritis    Blood transfusion without reported diagnosis    CHF (congestive heart failure) (HCC)    Chronic pain    resolved per pt 04/12/20   COPD (chronic obstructive pulmonary disease) (HCC)    Coronary artery disease    Depression    Diabetes mellitus without complication (HCC)    type 2   GERD (gastroesophageal reflux disease)    Heart murmur    since birth; Echo 02/18/19: LVEF 68%, grade 1 diastolic dysfunction, mild AS (mean grad 12 mmHg), trace MR/PR, mild TR, PASP 32 mmHg     Hyperlipemia    Hypertension    PVD (peripheral vascular disease) (HCC)    right SFA stent 02/11/17 by Dr. Ladona   Sleep apnea    does not use cpap   Wears glasses     Current Outpatient Medications:    acetaminophen  (TYLENOL ) 500 MG tablet, Take 1,000 mg by mouth as needed for moderate pain., Disp: , Rfl:    albuterol  (PROVENTIL ) (2.5 MG/3ML) 0.083% nebulizer solution, Take 2.5 mg by nebulization in the morning, at noon, and at bedtime., Disp: , Rfl:    albuterol  (VENTOLIN  HFA) 108 (90 Base) MCG/ACT inhaler, Inhale 1-2 puffs into the lungs every 6 (six) hours as needed for  wheezing or shortness of breath., Disp: 8.5 g, Rfl: 1   atorvastatin  (LIPITOR ) 80 MG tablet, Take 80 mg by mouth daily., Disp: , Rfl:    Cholecalciferol  (VITAMIN D -3 PO), Take 1 capsule by mouth 2 (two) times daily., Disp: , Rfl:    ezetimibe  (ZETIA ) 10 MG tablet, TAKE 1 TABLET BY MOUTH DAILY (Patient taking differently: Take 10 mg by mouth daily.), Disp: 90 tablet, Rfl: 3   ferrous sulfate  325 (65 FE) MG tablet, Take 325 mg by mouth daily., Disp: , Rfl:    fluticasone  (FLONASE ) 50 MCG/ACT nasal spray, Place 1 spray into both nostrils daily as needed for allergies., Disp: , Rfl:    Fluticasone -Umeclidin-Vilant (TRELEGY ELLIPTA ) 100-62.5-25 MCG/ACT AEPB, Inhale 1 puff into the lungs daily. (Patient taking differently: Inhale 2 puffs into the lungs daily.), Disp: 1 each, Rfl: 5   furosemide  (LASIX ) 40 MG tablet, Take 1 tablet (40 mg total) by mouth 2 (two) times daily. (Patient taking differently: Take 40 mg by mouth daily.), Disp: 60 tablet, Rfl: 0   gabapentin  (NEURONTIN ) 600 MG tablet, Take 1 tablet (600 mg total) by mouth at bedtime. (Patient taking differently: Take 600 mg by mouth 3 (three) times daily.), Disp: 30 tablet, Rfl: 11   metoprolol  succinate (TOPROL -XL) 25 MG 24 hr tablet,  TAKE 1 TABLET BY MOUTH EVERY DAY IN THE MORNING, Disp: 90 tablet, Rfl: 3   MOUNJARO 7.5 MG/0.5ML Pen, Inject 7.5 mg into the skin once a week., Disp: , Rfl:    nitroGLYCERIN  (NITROSTAT ) 0.4 MG SL tablet, Place 1 tablet (0.4 mg total) under the tongue every 5 (five) minutes as needed for chest pain., Disp: 90 tablet, Rfl: 3   octreotide  (SANDOSTATIN ) 100 MCG/ML SOLN injection, Inject 1 mL (100 mcg total) into the skin 3 (three) times daily., Disp: 90 mL, Rfl: 2   OXYGEN , Inhale 3 L into the lungs continuous., Disp: , Rfl:    pantoprazole  (PROTONIX ) 40 MG tablet, Take 1 tablet (40 mg total) by mouth 2 (two) times daily. (Patient taking differently: Take 40 mg by mouth daily.), Disp: 60 tablet, Rfl: 0   Potassium  Chloride ER 20 MEQ TBCR, Take 20 mEq by mouth daily., Disp: , Rfl:    sacubitril -valsartan  (ENTRESTO ) 24-26 MG, Take 1 tablet by mouth 2 (two) times daily., Disp: 60 tablet, Rfl: 2   spironolactone  (ALDACTONE ) 25 MG tablet, TAKE 1 TABLET BY MOUTH EVERY DAY IN THE MORNING, Disp: 90 tablet, Rfl: 2   SYRINGE-NEEDLE, DISP, 3 ML 25G X 5/8 3 ML MISC, Inject 1 Syringe into the skin 3 (three) times daily. For use with Octreodide injection., Disp: 100 each, Rfl: 2   traMADol  (ULTRAM ) 50 MG tablet, Take 1 tablet (50 mg total) by mouth at bedtime as needed., Disp: 20 tablet, Rfl: 0  Social History   Tobacco Use  Smoking Status Former   Current packs/day: 0.00   Average packs/day: 0.6 packs/day for 54.8 years (32.9 ttl pk-yrs)   Types: Cigarettes   Start date: 08/18/1966   Quit date: 05/25/2021   Years since quitting: 2.7  Smokeless Tobacco Never    No Known Allergies Objective:  There were no vitals filed for this visit. There is no height or weight on file to calculate BMI. Constitutional Well developed. Well nourished.  Vascular Dorsalis pedis pulses palpable bilaterally. Posterior tibial pulses palpable bilaterally. Capillary refill normal to all digits.  No cyanosis or clubbing noted. Pedal hair growth normal.  Neurologic Normal speech. Oriented to person, place, and time. Epicritic sensation to light touch grossly present bilaterally.  Dermatologic Nails well groomed and normal in appearance. No open wounds. No skin lesions.  Orthopedic: Pain on palpation right Achilles tendon insertional pain positive Haglund's deformity noted positive Silfverskiold test noted with gastrocnemius equinus.  No pain at the posterior tibial tendon peroneal tendon ATFL ligament.   Radiographs: None none Assessment:   1. Right Achilles tendinitis   2. Haglund's deformity, right     Plan:  Patient was evaluated and treated and all questions answered.  Right Achilles tendinitis with underlying  Haglund's deformity - All questions and concerns were discussed with the patient extensive detail clinically patient's pain improved considerably.  At this time she still has some residual pain should benefit from steroid injection because skin flap to compensate for pain.  Patient agrees with plan to proceed with steroid injection -A steroid injection was performed at right Kager's fat pad using 1% plain Lidocaine  and 10 mg of Kenalog. This was well tolerated. - Shoe gear modification discussed  No follow-ups on file.

## 2024-04-26 ENCOUNTER — Encounter: Payer: Self-pay | Admitting: Radiology

## 2024-06-30 ENCOUNTER — Emergency Department (HOSPITAL_COMMUNITY)

## 2024-06-30 ENCOUNTER — Other Ambulatory Visit: Payer: Self-pay

## 2024-06-30 ENCOUNTER — Emergency Department (HOSPITAL_COMMUNITY)
Admission: EM | Admit: 2024-06-30 | Discharge: 2024-06-30 | Disposition: A | Discharge status: Home / Self Care | Source: Ambulatory Visit

## 2024-06-30 DIAGNOSIS — R079 Chest pain, unspecified: Secondary | ICD-10-CM | POA: Insufficient documentation

## 2024-06-30 DIAGNOSIS — R0789 Other chest pain: Secondary | ICD-10-CM

## 2024-06-30 LAB — CBC WITH DIFFERENTIAL/PLATELET
Abs Immature Granulocytes: 0.03 K/uL (ref 0.00–0.07)
Basophils Absolute: 0 K/uL (ref 0.0–0.1)
Basophils Relative: 0 %
Eosinophils Absolute: 0.1 K/uL (ref 0.0–0.5)
Eosinophils Relative: 2 %
HCT: 49.9 % — ABNORMAL HIGH (ref 36.0–46.0)
Hemoglobin: 15.4 g/dL — ABNORMAL HIGH (ref 12.0–15.0)
Immature Granulocytes: 0 %
Lymphocytes Relative: 24 %
Lymphs Abs: 2.1 K/uL (ref 0.7–4.0)
MCH: 27.3 pg (ref 26.0–34.0)
MCHC: 30.9 g/dL (ref 30.0–36.0)
MCV: 88.3 fL (ref 80.0–100.0)
Monocytes Absolute: 0.8 K/uL (ref 0.1–1.0)
Monocytes Relative: 9 %
Neutro Abs: 5.9 K/uL (ref 1.7–7.7)
Neutrophils Relative %: 65 %
Platelets: 532 K/uL — ABNORMAL HIGH (ref 150–400)
RBC: 5.65 MIL/uL — ABNORMAL HIGH (ref 3.87–5.11)
RDW: 16.2 % — ABNORMAL HIGH (ref 11.5–15.5)
WBC: 9 K/uL (ref 4.0–10.5)
nRBC: 0 % (ref 0.0–0.2)

## 2024-06-30 LAB — BASIC METABOLIC PANEL WITH GFR
Anion gap: 8 (ref 5–15)
BUN: 13 mg/dL (ref 8–23)
CO2: 22 mmol/L (ref 22–32)
Calcium: 7.2 mg/dL — ABNORMAL LOW (ref 8.9–10.3)
Chloride: 108 mmol/L (ref 98–111)
Creatinine, Ser: 0.7 mg/dL (ref 0.44–1.00)
GFR, Estimated: >60 mL/min
Glucose, Bld: 72 mg/dL (ref 70–99)
Potassium: 6.1 mmol/L — ABNORMAL HIGH (ref 3.5–5.1)
Sodium: 138 mmol/L (ref 135–145)

## 2024-06-30 LAB — TROPONIN T, HIGH SENSITIVITY: Troponin T High Sensitivity: <15 ng/L (ref 0–19)

## 2024-06-30 MED ORDER — KETOROLAC TROMETHAMINE 15 MG/ML IJ SOLN
15.0000 mg | Freq: Once | INTRAMUSCULAR | Status: AC
  Administered 2024-06-30: 15 mg via INTRAMUSCULAR
  Filled 2024-06-30: qty 1

## 2024-06-30 MED ORDER — METHOCARBAMOL 500 MG PO TABS
1000.0000 mg | ORAL_TABLET | Freq: Once | ORAL | Status: AC
  Administered 2024-06-30: 1000 mg via ORAL
  Filled 2024-06-30: qty 2

## 2024-06-30 MED ORDER — METHOCARBAMOL 500 MG PO TABS: 500.0000 mg | ORAL_TABLET | Freq: Four times a day (QID) | ORAL | 0 refills | Status: AC | PRN

## 2024-06-30 NOTE — Discharge Instructions (Signed)
 Take Tylenol  and Motrin as needed for pain.  You can also use Robaxin  up to 4 times a day as needed

## 2024-06-30 NOTE — ED Provider Notes (Signed)
 " [name] EMERGENCY DEPARTMENT AT [facility] Provider Note   CSN: [id number] Arrival date & time: 06/30/24  1433     Patient presents with: No chief complaint on file.   [name] is a 73 y.o. female.   74 year old female presents for evaluation of chest pain.  States has been going for 2 months but today she woke up with worsening right-sided chest pain.  States that hurts to breathe and move her arm.  Denies any falls or injury.  Denies any shortness of breath, nausea, or any other symptoms or concerns at this time.        Prior to Admission medications  Medication Sig Start Date End Date Taking? Authorizing Provider  methocarbamol  (ROBAXIN ) 500 MG tablet Take 1 tablet (500 mg total) by mouth every 6 (six) hours as needed for up to 7 days for muscle spasms. 06/30/24 07/07/24 Yes Nakesha Ebrahim L, DO  acetaminophen  (TYLENOL ) 500 MG tablet Take 1,000 mg by mouth as needed for moderate pain.    [provider]  albuterol  (PROVENTIL ) (2.5 MG/3ML) 0.083% nebulizer solution Take 2.5 mg by nebulization in the morning, at noon, and at bedtime.    [provider]  albuterol  (VENTOLIN  HFA) 108 (90 Base) MCG/ACT inhaler Inhale 1-2 puffs into the lungs every 6 (six) hours as needed for wheezing or shortness of breath. 11/13/21   Jillian Buttery, MD  atorvastatin  (LIPITOR ) 80 MG tablet Take 80 mg by mouth daily. 08/08/20   [provider]  Cholecalciferol  (VITAMIN D -3 PO) Take 1 capsule by mouth 2 (two) times daily.    [provider]  ezetimibe  (ZETIA ) 10 MG tablet TAKE 1 TABLET BY MOUTH DAILY Patient taking differently: Take 10 mg by mouth daily. 10/15/21   [name], [name], DO  ferrous sulfate  325 (65 FE) MG tablet Take 325 mg by mouth daily.    [provider]  fluticasone  (FLONASE ) 50 MCG/ACT nasal spray Place 1 spray into both nostrils daily as needed for allergies. 08/14/21   [provider]  Fluticasone -Umeclidin-Vilant  (TRELEGY ELLIPTA ) 100-62.5-25 MCG/ACT AEPB Inhale 1 puff into the lungs daily. Patient taking differently: Inhale 2 puffs into the lungs daily. 02/13/22   Icard, Adine CROME, DO  furosemide  (LASIX ) 40 MG tablet Take 1 tablet (40 mg total) by mouth 2 (two) times daily. Patient taking differently: Take 40 mg by mouth daily. 01/31/23 04/01/23  Nicholas Bar, MD  gabapentin  (NEURONTIN ) 600 MG tablet Take 1 tablet (600 mg total) by mouth at bedtime. Patient taking differently: Take 600 mg by mouth 3 (three) times daily. 10/02/17   Hyatt, Max T, DPM  metoprolol  succinate (TOPROL -XL) 25 MG 24 hr tablet TAKE 1 TABLET BY MOUTH EVERY DAY IN THE MORNING 06/19/23   [name], [name], DO  MOUNJARO 7.5 MG/0.5ML Pen Inject 7.5 mg into the skin once a week. 11/10/23   [provider]  nitroGLYCERIN  (NITROSTAT ) 0.4 MG SL tablet Place 1 tablet (0.4 mg total) under the tongue every 5 (five) minutes as needed for chest pain. 03/21/22 11/26/23  [name], [name], DO  octreotide  (SANDOSTATIN ) 100 MCG/ML SOLN injection Inject 1 mL (100 mcg total) into the skin 3 (three) times daily. 09/06/22   Verdene Purchase, MD  OXYGEN  Inhale 3 L into the lungs continuous.    [provider]  pantoprazole  (PROTONIX ) 40 MG tablet Take 1 tablet (40 mg total) by mouth 2 (two) times daily. Patient taking differently: Take 40 mg by mouth daily. 09/25/21   Singh, Prashant  K, MD  Potassium Chloride  ER 20 MEQ TBCR Take 20 mEq by mouth daily. 08/21/22   [provider]  sacubitril -valsartan  (ENTRESTO ) 24-26 MG Take 1 tablet by mouth 2 (two) times daily. 03/25/22   [name], [name], DO  spironolactone  (ALDACTONE ) 25 MG tablet TAKE 1 TABLET BY MOUTH EVERY DAY IN THE MORNING 10/16/23   [name], [name], DO  SYRINGE-NEEDLE, DISP, 3 ML 25G X 5/8 3 ML MISC Inject 1 Syringe into the skin 3 (three) times daily. For use with Octreodide injection. 09/06/22   Krishnan, Gokul, MD  traMADol  (ULTRAM ) 50 MG tablet Take 1 tablet (50 mg total) by mouth at bedtime as  needed. 07/06/23   Magnant, Carlin CROME, PA-C    Allergies: Patient has no known allergies.    Review of Systems  Constitutional:  Negative for chills and fever.  HENT:  Negative for ear pain and sore throat.   Eyes:  Negative for pain and visual disturbance.  Respiratory:  Negative for cough and shortness of breath.   Cardiovascular:  Positive for chest pain. Negative for palpitations.  Gastrointestinal:  Negative for abdominal pain and vomiting.  Genitourinary:  Negative for dysuria and hematuria.  Musculoskeletal:  Negative for arthralgias and back pain.  Skin:  Negative for color change and rash.  Neurological:  Negative for seizures and syncope.  All other systems reviewed and are negative.   Updated Vital Signs BP 138/86   Pulse 62   Temp 98.1 F (36.7 C) (Oral)   Resp 18   LMP  (LMP Unknown)   SpO2 99%   Physical Exam Vitals and nursing note reviewed.  Constitutional:      General: She is not in acute distress.    Appearance: Normal appearance. She is well-developed. She is not ill-appearing.  HENT:     Head: Normocephalic and atraumatic.  Eyes:     Conjunctiva/sclera: Conjunctivae normal.  Cardiovascular:     Rate and Rhythm: Normal rate and regular rhythm.     Heart sounds: No murmur heard. Pulmonary:     Effort: Pulmonary effort is normal. No respiratory distress.     Breath sounds: Normal breath sounds.  Abdominal:     Palpations: Abdomen is soft.     Tenderness: There is no abdominal tenderness.  Musculoskeletal:        General: No swelling.     Cervical back: Neck supple.  Skin:    General: Skin is warm and dry.     Capillary Refill: Capillary refill takes less than 2 seconds.  Neurological:     General: No focal deficit present.     Mental Status: She is alert.  Psychiatric:        Mood and Affect: Mood normal.     (all labs ordered are listed, but only abnormal results are displayed) Labs Reviewed  CBC WITH DIFFERENTIAL/PLATELET - Abnormal;  Notable for the following components:      Result Value   RBC 5.65 (*)    Hemoglobin 15.4 (*)    HCT 49.9 (*)    RDW 16.2 (*)    Platelets 532 (*)    All other components within normal limits  BASIC METABOLIC PANEL WITH GFR - Abnormal; Notable for the following components:   Potassium 6.1 (*)    Calcium  7.2 (*)    All other components within normal limits  TROPONIN T, HIGH SENSITIVITY  TROPONIN T, HIGH SENSITIVITY    EKG: None  Radiology: DG Chest 2 View Result Date: 06/30/2024 EXAM: 2  VIEW(S) XRAY OF THE CHEST 06/30/2024 08:22:02 PM COMPARISON: 01/29/2023 CLINICAL HISTORY: chest pain FINDINGS: LUNGS AND PLEURA: Linear scarring or atelectasis in right mid and left lower lung zones. No pleural effusion. No pneumothorax. HEART AND MEDIASTINUM: Cardiomegaly. Calcified aorta. BONES AND SOFT TISSUES: Thoracic degenerative changes. IMPRESSION: 1. No acute cardiopulmonary abnormality. 2. Cardiomegaly and aortic atherosclerosis. 3. Linear scarring or atelectasis in the right mid and left lower lung zones. Electronically signed by: Elsie Gravely MD 06/30/2024 08:29 PM EST RP Workstation: [id number]     Procedures   Medications Ordered in the ED  ketorolac  (TORADOL ) 15 MG/ML injection 15 mg (15 mg Intramuscular Given 06/30/24 1939)  methocarbamol  (ROBAXIN ) tablet 1,000 mg (1,000 mg Oral Given 06/30/24 2131)                                    Medical Decision Making Patient here for chest pain that is on the right side and reproducible on exam.  Likely musculoskeletal.  Troponin and lab work is negative.  EKG and chest x-ray unremarkable.  Given Toradol  here and I will give prescription for Robaxin  as well.  Advised Tylenol  Motrin as needed for pain and obtain close follow-up with primary care otherwise return to the ER for new or worsening symptoms.  She feels comfortable with plan be discharged home.  Problems Addressed: Musculoskeletal chest pain: acute illness or injury  Amount and/or  Complexity of Data Reviewed External Data Reviewed: notes.    Details: No prior ED records for review Labs: ordered. Decision-making details documented in ED Course.    Details: Ordered and reviewed by me and unremarkable Radiology: ordered and independent interpretation performed. Decision-making details documented in ED Course.    Details: Ordered and interpreted by me independently of radiology Chest x-ray: Shows no acute abnormality ECG/medicine tests: ordered and independent interpretation performed. Decision-making details documented in ED Course.    Details: Ordered and interpreted by me in the absence of cardiology and shows sinus rhythm, no STEMI, or significant change when compared to prior EKG  Risk OTC drugs. Prescription drug management.     Final diagnoses:  Musculoskeletal chest pain    ED Discharge Orders          Ordered    methocarbamol  (ROBAXIN ) 500 MG tablet  Every 6 hours PRN        06/30/24 2113               Kaspian Muccio L, DO 06/30/24 2308  "

## 2024-06-30 NOTE — ED Triage Notes (Signed)
 Patient complains of right sided chest pain. States she woke up with her chest hurting. Hurts when she moves around. States she has had the pain for about 2 months now. Denies N/V. Rates pain 8, hurts worse when she moves. Denies injury. Denies shortness of breath.

## 2024-07-07 ENCOUNTER — Other Ambulatory Visit: Payer: Self-pay | Admitting: Cardiology

## 2024-07-07 ENCOUNTER — Ambulatory Visit: Admitting: Podiatry

## 2024-07-07 DIAGNOSIS — M7661 Achilles tendinitis, right leg: Secondary | ICD-10-CM

## 2024-07-07 DIAGNOSIS — I455 Other specified heart block: Secondary | ICD-10-CM

## 2024-07-07 MED ORDER — METOPROLOL SUCCINATE ER 25 MG PO TB24: 25.0000 mg | ORAL_TABLET | Freq: Every day | ORAL | 3 refills | Status: AC

## 2024-07-07 NOTE — Progress Notes (Signed)
 "  Subjective:  Patient ID: [name], female    DOB: February 07, 1951,  MRN: [id number]  Chief Complaint  Patient presents with   Foot Pain    Pt stated that she has been having some discomfort with [name] heel     74 y.o. female presents with the above complaint.  Patient presents with right Achilles tendinitis and insertional pain.  She states it started coming back.  She was doing good after the injection.  But started hurting again she wants to get it evaluated denies any other acute complaints  Review of Systems: Negative except as noted in the HPI. Denies N/V/F/Ch.  Past Medical History:  Diagnosis Date   Anemia 12/26/2015   Anxiety    Arthritis    Blood transfusion without reported diagnosis    CHF (congestive heart failure) (HCC)    Chronic pain    resolved per pt 04/12/20   COPD (chronic obstructive pulmonary disease) (HCC)    Coronary artery disease    Depression    Diabetes mellitus without complication (HCC)    type 2   GERD (gastroesophageal reflux disease)    Heart murmur    since birth; Echo 02/18/19: LVEF 68%, grade 1 diastolic dysfunction, mild AS (mean grad 12 mmHg), trace MR/PR, mild TR, PASP 32 mmHg     Hyperlipemia    Hypertension    PVD (peripheral vascular disease)    right SFA stent 02/11/17 by Dr. Ladona   Sleep apnea    does not use cpap   Wears glasses     Current Outpatient Medications:    acetaminophen  (TYLENOL ) 500 MG tablet, Take 1,000 mg by mouth as needed for moderate pain., Disp: , Rfl:    albuterol  (PROVENTIL ) (2.5 MG/3ML) 0.083% nebulizer solution, Take 2.5 mg by nebulization in the morning, at noon, and at bedtime., Disp: , Rfl:    albuterol  (VENTOLIN  HFA) 108 (90 Base) MCG/ACT inhaler, Inhale 1-2 puffs into the lungs every 6 (six) hours as needed for wheezing or shortness of breath., Disp: 8.5 g, Rfl: 1   atorvastatin  (LIPITOR ) 80 MG tablet, Take 80 mg by mouth daily., Disp: , Rfl:    Cholecalciferol  (VITAMIN D -3 PO), Take 1 capsule by  mouth 2 (two) times daily., Disp: , Rfl:    ezetimibe  (ZETIA ) 10 MG tablet, TAKE 1 TABLET BY MOUTH DAILY (Patient taking differently: Take 10 mg by mouth daily.), Disp: 90 tablet, Rfl: 3   ferrous sulfate  325 (65 FE) MG tablet, Take 325 mg by mouth daily., Disp: , Rfl:    fluticasone  (FLONASE ) 50 MCG/ACT nasal spray, Place 1 spray into both nostrils daily as needed for allergies., Disp: , Rfl:    Fluticasone -Umeclidin-Vilant (TRELEGY ELLIPTA ) 100-62.5-25 MCG/ACT AEPB, Inhale 1 puff into the lungs daily. (Patient taking differently: Inhale 2 puffs into the lungs daily.), Disp: 1 each, Rfl: 5   furosemide  (LASIX ) 40 MG tablet, Take 1 tablet (40 mg total) by mouth 2 (two) times daily. (Patient taking differently: Take 40 mg by mouth daily.), Disp: 60 tablet, Rfl: 0   gabapentin  (NEURONTIN ) 600 MG tablet, Take 1 tablet (600 mg total) by mouth at bedtime. (Patient taking differently: Take 600 mg by mouth 3 (three) times daily.), Disp: 30 tablet, Rfl: 11   methocarbamol  (ROBAXIN ) 500 MG tablet, Take 1 tablet (500 mg total) by mouth every 6 (six) hours as needed for up to 7 days for muscle spasms., Disp: 28 tablet, Rfl: 0   metoprolol  succinate (TOPROL -XL) 25 MG 24 hr tablet,  TAKE 1 TABLET BY MOUTH EVERY DAY IN THE MORNING, Disp: 90 tablet, Rfl: 3   MOUNJARO 7.5 MG/0.5ML Pen, Inject 7.5 mg into the skin once a week., Disp: , Rfl:    nitroGLYCERIN  (NITROSTAT ) 0.4 MG SL tablet, Place 1 tablet (0.4 mg total) under the tongue every 5 (five) minutes as needed for chest pain., Disp: 90 tablet, Rfl: 3   octreotide  (SANDOSTATIN ) 100 MCG/ML SOLN injection, Inject 1 mL (100 mcg total) into the skin 3 (three) times daily., Disp: 90 mL, Rfl: 2   OXYGEN , Inhale 3 L into the lungs continuous., Disp: , Rfl:    pantoprazole  (PROTONIX ) 40 MG tablet, Take 1 tablet (40 mg total) by mouth 2 (two) times daily. (Patient taking differently: Take 40 mg by mouth daily.), Disp: 60 tablet, Rfl: 0   Potassium Chloride  ER 20 MEQ TBCR,  Take 20 mEq by mouth daily., Disp: , Rfl:    sacubitril -valsartan  (ENTRESTO ) 24-26 MG, Take 1 tablet by mouth 2 (two) times daily., Disp: 60 tablet, Rfl: 2   spironolactone  (ALDACTONE ) 25 MG tablet, TAKE 1 TABLET BY MOUTH EVERY DAY IN THE MORNING, Disp: 90 tablet, Rfl: 2   SYRINGE-NEEDLE, DISP, 3 ML 25G X 5/8 3 ML MISC, Inject 1 Syringe into the skin 3 (three) times daily. For use with Octreodide injection., Disp: 100 each, Rfl: 2   traMADol  (ULTRAM ) 50 MG tablet, Take 1 tablet (50 mg total) by mouth at bedtime as needed., Disp: 20 tablet, Rfl: 0  Social History   Tobacco Use  Smoking Status Former   Current packs/day: 0.00   Average packs/day: 0.6 packs/day for 54.8 years (32.9 ttl pk-yrs)   Types: Cigarettes   Start date: 08/18/1966   Quit date: 05/25/2021   Years since quitting: 3.1  Smokeless Tobacco Never    No Known Allergies Objective:  There were no vitals filed for this visit. There is no height or weight on file to calculate BMI. Constitutional Well developed. Well nourished.  Vascular Dorsalis pedis pulses palpable bilaterally. Posterior tibial pulses palpable bilaterally. Capillary refill normal to all digits.  No cyanosis or clubbing noted. Pedal hair growth normal.  Neurologic Normal speech. Oriented to person, place, and time. Epicritic sensation to light touch grossly present bilaterally.  Dermatologic Nails well groomed and normal in appearance. No open wounds. No skin lesions.  Orthopedic: Pain on palpation right Achilles tendon insertional pain positive Haglund's deformity noted positive Silfverskiold test noted with gastrocnemius equinus.  No pain at the posterior tibial tendon peroneal tendon ATFL ligament.   Radiographs: None none Assessment:   1. Right Achilles tendinitis     Plan:  Patient was evaluated and treated and all questions answered.  Right Achilles tendinitis with underlying Haglund's deformity~recurrence - All questions and concerns  were discussed with the patient extensive detail clinically patient continues to have pain.  She was doing good but then started back up again.  She flexors of in a cam boot given the patient continues to have recurring pain she would benefit from an MRI evaluation MRI was ordered I will see [name] back again in 4 weeks.  If there is tearing in the Achilles tendon patient will need surgical intervention she agrees with the plan  No follow-ups on file.  "

## 2024-07-13 ENCOUNTER — Encounter: Payer: Self-pay | Admitting: Acute Care

## 2024-07-14 ENCOUNTER — Encounter: Payer: Self-pay | Admitting: Acute Care

## 2024-07-21 ENCOUNTER — Inpatient Hospital Stay
Admission: RE | Admit: 2024-07-21 | Discharge: 2024-07-21 | Disposition: A | Source: Ambulatory Visit | Attending: Acute Care | Admitting: Acute Care

## 2024-07-21 ENCOUNTER — Ambulatory Visit
Admission: RE | Admit: 2024-07-21 | Discharge: 2024-07-21 | Disposition: A | Source: Ambulatory Visit | Attending: Podiatry

## 2024-07-21 DIAGNOSIS — F17210 Nicotine dependence, cigarettes, uncomplicated: Secondary | ICD-10-CM

## 2024-07-21 DIAGNOSIS — Z122 Encounter for screening for malignant neoplasm of respiratory organs: Secondary | ICD-10-CM

## 2024-07-21 DIAGNOSIS — Z87891 Personal history of nicotine dependence: Secondary | ICD-10-CM

## 2024-07-21 DIAGNOSIS — M7661 Achilles tendinitis, right leg: Secondary | ICD-10-CM

## 2024-07-22 ENCOUNTER — Other Ambulatory Visit: Payer: Self-pay | Admitting: Acute Care

## 2024-07-22 DIAGNOSIS — Z87891 Personal history of nicotine dependence: Secondary | ICD-10-CM

## 2024-07-22 DIAGNOSIS — Z122 Encounter for screening for malignant neoplasm of respiratory organs: Secondary | ICD-10-CM
